# Patient Record
Sex: Female | Born: 1955
Health system: Southern US, Community
[De-identification: ages and names within clinical notes are randomized; demographics above are authoritative.]

## PROBLEM LIST (undated history)

## (undated) DIAGNOSIS — C541 Malignant neoplasm of endometrium: Secondary | ICD-10-CM

## (undated) DIAGNOSIS — Z923 Personal history of irradiation: Secondary | ICD-10-CM

## (undated) DIAGNOSIS — F329 Major depressive disorder, single episode, unspecified: Secondary | ICD-10-CM

## (undated) DIAGNOSIS — Z9889 Other specified postprocedural states: Secondary | ICD-10-CM

## (undated) DIAGNOSIS — R112 Nausea with vomiting, unspecified: Secondary | ICD-10-CM

## (undated) DIAGNOSIS — F419 Anxiety disorder, unspecified: Secondary | ICD-10-CM

## (undated) DIAGNOSIS — IMO0001 Reserved for inherently not codable concepts without codable children: Secondary | ICD-10-CM

## (undated) DIAGNOSIS — F32A Depression, unspecified: Secondary | ICD-10-CM

## (undated) DIAGNOSIS — I10 Essential (primary) hypertension: Secondary | ICD-10-CM

## (undated) DIAGNOSIS — IMO0002 Reserved for concepts with insufficient information to code with codable children: Secondary | ICD-10-CM

## (undated) DIAGNOSIS — R918 Other nonspecific abnormal finding of lung field: Secondary | ICD-10-CM

## (undated) DIAGNOSIS — E041 Nontoxic single thyroid nodule: Secondary | ICD-10-CM

## (undated) DIAGNOSIS — D649 Anemia, unspecified: Secondary | ICD-10-CM

## (undated) DIAGNOSIS — E785 Hyperlipidemia, unspecified: Secondary | ICD-10-CM

## (undated) HISTORY — DX: Hyperlipidemia, unspecified: E78.5

## (undated) HISTORY — DX: Malignant neoplasm of endometrium: C54.1

## (undated) HISTORY — PX: ABDOMINAL HYSTERECTOMY: SHX81

## (undated) HISTORY — DX: Reserved for inherently not codable concepts without codable children: IMO0001

## (undated) HISTORY — DX: Reserved for concepts with insufficient information to code with codable children: IMO0002

## (undated) HISTORY — DX: Personal history of irradiation: Z92.3

## (undated) HISTORY — DX: Essential (primary) hypertension: I10

---

## 1998-08-12 ENCOUNTER — Other Ambulatory Visit: Admission: RE | Admit: 1998-08-12 | Discharge: 1998-08-12 | Payer: Self-pay | Admitting: Obstetrics and Gynecology

## 2005-09-28 ENCOUNTER — Ambulatory Visit (HOSPITAL_COMMUNITY): Admission: RE | Admit: 2005-09-28 | Discharge: 2005-09-28 | Payer: Self-pay | Admitting: Internal Medicine

## 2006-06-05 HISTORY — PX: COLONOSCOPY: SHX174

## 2006-09-20 ENCOUNTER — Other Ambulatory Visit: Admission: RE | Admit: 2006-09-20 | Discharge: 2006-09-20 | Payer: Self-pay | Admitting: Family Medicine

## 2006-11-23 ENCOUNTER — Ambulatory Visit (HOSPITAL_COMMUNITY): Admission: RE | Admit: 2006-11-23 | Discharge: 2006-11-23 | Payer: Self-pay | Admitting: Gastroenterology

## 2007-03-20 ENCOUNTER — Ambulatory Visit (HOSPITAL_COMMUNITY): Admission: RE | Admit: 2007-03-20 | Discharge: 2007-03-20 | Payer: Self-pay | Admitting: Family Medicine

## 2008-06-16 ENCOUNTER — Ambulatory Visit: Payer: Self-pay | Admitting: Family Medicine

## 2009-03-19 ENCOUNTER — Ambulatory Visit (HOSPITAL_COMMUNITY): Admission: RE | Admit: 2009-03-19 | Discharge: 2009-03-19 | Payer: Self-pay | Admitting: Family Medicine

## 2010-01-04 ENCOUNTER — Other Ambulatory Visit: Admission: RE | Admit: 2010-01-04 | Discharge: 2010-01-04 | Payer: Self-pay | Admitting: Family Medicine

## 2010-03-20 ENCOUNTER — Emergency Department (HOSPITAL_COMMUNITY): Admission: EM | Admit: 2010-03-20 | Discharge: 2010-03-20 | Payer: Self-pay | Admitting: Emergency Medicine

## 2010-07-05 NOTE — Assessment & Plan Note (Signed)
Summary: nutrition class,df

## 2010-08-17 LAB — URINALYSIS, ROUTINE W REFLEX MICROSCOPIC
Bilirubin Urine: NEGATIVE
Glucose, UA: NEGATIVE mg/dL
Hgb urine dipstick: NEGATIVE
Ketones, ur: NEGATIVE mg/dL
Nitrite: NEGATIVE
Protein, ur: NEGATIVE mg/dL
Specific Gravity, Urine: 1.018 (ref 1.005–1.030)
Urobilinogen, UA: 0.2 mg/dL (ref 0.0–1.0)
pH: 6 (ref 5.0–8.0)

## 2010-08-17 LAB — POCT I-STAT, CHEM 8
BUN: 23 mg/dL (ref 6–23)
BUN: 26 mg/dL — ABNORMAL HIGH (ref 6–23)
Calcium, Ion: 1.11 mmol/L — ABNORMAL LOW (ref 1.12–1.32)
Calcium, Ion: 1.15 mmol/L (ref 1.12–1.32)
Chloride: 104 mEq/L (ref 96–112)
Chloride: 106 mEq/L (ref 96–112)
Creatinine, Ser: 1.4 mg/dL — ABNORMAL HIGH (ref 0.4–1.2)
Creatinine, Ser: 1.7 mg/dL — ABNORMAL HIGH (ref 0.4–1.2)
Glucose, Bld: 101 mg/dL — ABNORMAL HIGH (ref 70–99)
Glucose, Bld: 131 mg/dL — ABNORMAL HIGH (ref 70–99)
HCT: 32 % — ABNORMAL LOW (ref 36.0–46.0)
HCT: 35 % — ABNORMAL LOW (ref 36.0–46.0)
Hemoglobin: 10.9 g/dL — ABNORMAL LOW (ref 12.0–15.0)
Hemoglobin: 11.9 g/dL — ABNORMAL LOW (ref 12.0–15.0)
Potassium: 4 mEq/L (ref 3.5–5.1)
Potassium: 4.1 mEq/L (ref 3.5–5.1)
Sodium: 135 mEq/L (ref 135–145)
Sodium: 135 mEq/L (ref 135–145)
TCO2: 20 mmol/L (ref 0–100)
TCO2: 23 mmol/L (ref 0–100)

## 2010-08-17 LAB — URINE MICROSCOPIC-ADD ON

## 2010-08-17 LAB — POCT CARDIAC MARKERS
CKMB, poc: <1 ng/mL — ABNORMAL LOW (ref 1.0–8.0)
Myoglobin, poc: 113 ng/mL (ref 12–200)
Troponin i, poc: <0.05 ng/mL (ref 0.00–0.09)

## 2010-10-18 NOTE — Op Note (Signed)
Diane Lozano, Diane Lozano                 ACCOUNT NO.:  [id number]   MEDICAL RECORD NO.:  [id number]          PATIENT TYPE:  AMB   LOCATION:  ENDO                         FACILITY:  [facility]   PHYSICIAN:  Shirley Friar, MDDATE OF BIRTH:  1956-05-15   DATE OF PROCEDURE:  DATE OF DISCHARGE:                               OPERATIVE REPORT   PROCEDURE:  Colonoscopy.   INDICATIONS:  Screening.   MEDICATIONS:  Fentanyl 75 mcg IV, Versed 7 mg IV.   FINDINGS:  Rectal exam was normal.  An adult Pentax colonoscope was  inserted into an adequately prepped colon and advanced to the cecum  where the ileocecal valve and appendiceal orifice were identified.  The  terminal ileum was intubated and was normal in appearance.  On careful  withdrawal of the colonoscope, a small single diverticulum was noted in  the proximal ascending colon.  No other mucosal abnormalities were seen  in the colon.  Retroflexion was unremarkable.   ASSESSMENT:  Sigmoid diverticulum in proximal ascending colon.  Otherwise normal colonoscopy.   PLAN:  Repeat colonoscopy in 10 years.      Shirley Friar, MD  Electronically Signed     VCS/MEDQ  D:  11/23/2006  T:  11/23/2006  Job:  [id number]   cc:   Sigmund Hazel, M.D.  Fax: [phone]

## 2012-06-05 DIAGNOSIS — C541 Malignant neoplasm of endometrium: Secondary | ICD-10-CM

## 2012-06-05 HISTORY — DX: Malignant neoplasm of endometrium: C54.1

## 2012-06-20 ENCOUNTER — Other Ambulatory Visit (HOSPITAL_COMMUNITY)
Admission: RE | Admit: 2012-06-20 | Discharge: 2012-06-20 | Disposition: A | Discharge status: Home / Self Care | Payer: 59 | Source: Ambulatory Visit | Attending: Family Medicine | Admitting: Family Medicine

## 2012-06-20 ENCOUNTER — Other Ambulatory Visit: Payer: Self-pay | Admitting: Family Medicine

## 2012-06-20 DIAGNOSIS — Z01419 Encounter for gynecological examination (general) (routine) without abnormal findings: Secondary | ICD-10-CM | POA: Insufficient documentation

## 2012-06-20 DIAGNOSIS — Z1151 Encounter for screening for human papillomavirus (HPV): Secondary | ICD-10-CM | POA: Insufficient documentation

## 2012-06-20 DIAGNOSIS — N950 Postmenopausal bleeding: Secondary | ICD-10-CM

## 2012-06-26 ENCOUNTER — Ambulatory Visit
Admission: RE | Admit: 2012-06-26 | Discharge: 2012-06-26 | Disposition: A | Discharge status: Home / Self Care | Payer: 59 | Source: Ambulatory Visit | Attending: Family Medicine | Admitting: Family Medicine

## 2012-06-26 DIAGNOSIS — N950 Postmenopausal bleeding: Secondary | ICD-10-CM

## 2012-08-16 ENCOUNTER — Telehealth: Payer: Self-pay | Admitting: Oncology

## 2012-08-16 NOTE — Telephone Encounter (Signed)
REFERRING - [facility] DX- ENDOMETRIUM  WELCOME PACKET MAILED.

## 2012-08-16 NOTE — Telephone Encounter (Signed)
LVOM FOR PT TO RETURN CALL IN RE NP APPT.  °

## 2012-08-19 ENCOUNTER — Telehealth: Payer: Self-pay | Admitting: Oncology

## 2012-08-19 NOTE — Telephone Encounter (Signed)
C/D 08/19/12 for appt.09/06/12

## 2012-08-20 ENCOUNTER — Other Ambulatory Visit: Payer: Self-pay | Admitting: Oncology

## 2012-08-20 ENCOUNTER — Telehealth: Payer: Self-pay

## 2012-08-20 NOTE — Telephone Encounter (Signed)
S/W pt in re to NP appt change to 03/24 @ 9:30 w/Dr. Darrold Span and Chemo Edu/PFC schedule for 03/19 @ 10.  Pt confirmed appts.

## 2012-08-20 NOTE — Telephone Encounter (Signed)
Left a message for [facility] PA for Dr. Clifton James that Ms. Magouirk's appointment was rescheduled to 08-26-12 with Chemotherapy Education scheduled for 08-21-12 by our new patient coordinator York Cerise.  Pt. Was notified of this rescheduling.

## 2012-08-20 NOTE — Telephone Encounter (Signed)
Revonda Standard with Dr. Clifton James called to see if Ms. Diane Lozano could be seen sooner than 09-06-12.  She is currently ~3 weeks post op and Dr. Clifton James would like for chemotherapy to begin in the next ~2 weeks.

## 2012-08-21 ENCOUNTER — Other Ambulatory Visit: Payer: 59

## 2012-08-21 ENCOUNTER — Encounter: Payer: Self-pay | Admitting: *Deleted

## 2012-08-21 ENCOUNTER — Ambulatory Visit: Payer: 59

## 2012-08-25 ENCOUNTER — Other Ambulatory Visit: Payer: Self-pay | Admitting: Oncology

## 2012-08-25 DIAGNOSIS — C541 Malignant neoplasm of endometrium: Secondary | ICD-10-CM

## 2012-08-26 ENCOUNTER — Ambulatory Visit (HOSPITAL_BASED_OUTPATIENT_CLINIC_OR_DEPARTMENT_OTHER): Payer: 59 | Admitting: Oncology

## 2012-08-26 ENCOUNTER — Other Ambulatory Visit (HOSPITAL_BASED_OUTPATIENT_CLINIC_OR_DEPARTMENT_OTHER): Payer: 59 | Admitting: Lab

## 2012-08-26 ENCOUNTER — Encounter: Payer: Self-pay | Admitting: Oncology

## 2012-08-26 ENCOUNTER — Ambulatory Visit: Payer: 59

## 2012-08-26 ENCOUNTER — Telehealth: Payer: Self-pay | Admitting: Oncology

## 2012-08-26 VITALS — BP 126/72 | HR 92 | Temp 97.5°F | Resp 18 | Ht 61.0 in | Wt 204.7 lb

## 2012-08-26 DIAGNOSIS — Z808 Family history of malignant neoplasm of other organs or systems: Secondary | ICD-10-CM

## 2012-08-26 DIAGNOSIS — E789 Disorder of lipoprotein metabolism, unspecified: Secondary | ICD-10-CM

## 2012-08-26 DIAGNOSIS — C549 Malignant neoplasm of corpus uteri, unspecified: Secondary | ICD-10-CM

## 2012-08-26 DIAGNOSIS — C541 Malignant neoplasm of endometrium: Secondary | ICD-10-CM

## 2012-08-26 DIAGNOSIS — I10 Essential (primary) hypertension: Secondary | ICD-10-CM | POA: Insufficient documentation

## 2012-08-26 LAB — CBC WITH DIFFERENTIAL/PLATELET
BASO%: 2 % (ref 0.0–2.0)
Basophils Absolute: 0.1 10*3/uL (ref 0.0–0.1)
EOS%: 6.4 % (ref 0.0–7.0)
Eosinophils Absolute: 0.4 10*3/uL (ref 0.0–0.5)
HCT: 37.8 % (ref 34.8–46.6)
HGB: 13 g/dL (ref 11.6–15.9)
LYMPH%: 23 % (ref 14.0–49.7)
MCH: 30.8 pg (ref 25.1–34.0)
MCHC: 34.5 g/dL (ref 31.5–36.0)
MCV: 89.4 fL (ref 79.5–101.0)
MONO#: 0.4 10*3/uL (ref 0.1–0.9)
MONO%: 6.6 % (ref 0.0–14.0)
NEUT#: 4 10*3/uL (ref 1.5–6.5)
NEUT%: 62 % (ref 38.4–76.8)
Platelets: 266 10*3/uL (ref 145–400)
RBC: 4.22 10*6/uL (ref 3.70–5.45)
RDW: 12.9 % (ref 11.2–14.5)
WBC: 6.5 10*3/uL (ref 3.9–10.3)
lymph#: 1.5 10*3/uL (ref 0.9–3.3)

## 2012-08-26 LAB — COMPREHENSIVE METABOLIC PANEL (CC13)
ALT: 26 U/L (ref 0–55)
AST: 19 U/L (ref 5–34)
Albumin: 3.9 g/dL (ref 3.5–5.0)
Alkaline Phosphatase: 78 U/L (ref 40–150)
BUN: 20.6 mg/dL (ref 7.0–26.0)
CO2: 24 mEq/L (ref 22–29)
Calcium: 9.8 mg/dL (ref 8.4–10.4)
Chloride: 103 mEq/L (ref 98–107)
Creatinine: 0.8 mg/dL (ref 0.6–1.1)
Glucose: 97 mg/dl (ref 70–99)
Potassium: 3.8 mEq/L (ref 3.5–5.1)
Sodium: 139 mEq/L (ref 136–145)
Total Bilirubin: 0.45 mg/dL (ref 0.20–1.20)
Total Protein: 7.8 g/dL (ref 6.4–8.3)

## 2012-08-26 LAB — IRON AND TIBC
%SAT: 22 % (ref 20–55)
Iron: 73 ug/dL (ref 42–145)
TIBC: 326 ug/dL (ref 250–470)
UIBC: 253 ug/dL (ref 125–400)

## 2012-08-26 NOTE — Progress Notes (Signed)
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 postmenopausal female who is seen, together with husband, at the request of Dr Madaline Guthrie, for consideration of adjuvant chemotherapy for recently diagnosed endometrial carcinoma. 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  Patient presented to PCP Sigmund Hazel with intermittent vaginal bleeding since ~ Oct 2013. Dr Hyacinth Meeker did endometrial biopsy 06-20-12 which showed complex endometrial hyperplasia with atypia. She had pelvic and transvaginal US in [facility] system on 06-26-12, which showed endometrial thickening of 2.7 cm, right ovary not clearly seen and left ovary with normal postmenopausal appearance. She was seen by Dr Gerald Leitz and referred to Dr Clifton James. She had robotic hysterectomy with BSO and bilateral pelvic and periaortic node dissection by Dr Clifton James at [facility] on 07-30-2012. Intraoperatively she had large endometrial polyp with high grade malignancy, no obvious extrauterine disease. Pathology ([facility] [place] [id number] from 07-30-2012) had high grade, poorly differentiated endometrial adenocarcinoma with solid and serous papillary components, 2 periaortic nodes negative and 9 right pelvic/11 left pelvic nodes negative. The tumor was 4.8 cm confined to polyp with no definite myometrial involvement, margins clear and no angiolymphatic invasion identified. Pathology report comment "Two components were identified. A conspicuous serous papillary malignancy was confirmed with p53 stain. In adition, there was a second tumor  component composed of sheets of extremely anaplastic cells with eosinophilic cytoplasm and frequent giant cells. These cells were strongly positive for cytokeratin AE1/AE# and vimentin and were negative for desmin, myogenin and smooth muscle actin. This would exclude a malignant mixed mullarian tumor and is more indicative of a very high grade solid endometrial adenocarcinoma. 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 She saw Dr Clifton James in follow up ~ 08-15-12 and I spoke with Dr Clifton James directly on 08-20-12. With concerning pathology, Dr Clifton James recommends q 3 week taxol carboplatin x 6 cycles, taxol at 175 mg/m2 and carboplatin at AUC = 5, and would consider vaginal brachytherapy concomitant with chemotherapy.  Patient attended chemotherapy education class prior to visit today. She is also familiar with [facility] as she previously worked [employer] and was involved with lung cancer multidisciplinary clinic there. As referral has not yet been made to Dr Roselind Messier, I have requested that consultation.  REVIEW OF SYSTEMS: Weight steady. Some constipation after surgery, bowels now moving regularly. Does not need pain meds now. No other bleeding. No HA, wears corrective lenses, due dental cleaning but no known active problems, no thyroid symptoms, no respiratory or cardiac symptoms, no bladder problems, no arthritis, no hx blood clots, no LE swelling. Feels she has recovered well from the robotic surgery. Peripheral IV access has been easily accomplished.    ALLERGIES: Review of patient's allergies indicates no known allergies.  PAST MEDICAL HISTORY:   HTN x ~ 10 years Elevated lipids C sections x2 Last mammograms at [facility] ~ 2 yrs ago Colonoscopy by Deboraha Sprang 2008, next in 10 years Menopause late 52s   Gyn oncology surgery as above  CURRENT MEDICATIONS: reviewed as listed in EMR. We will send prescriptions to [facility]  outpatient  pharmacy for decadron, lorazepam and zofran. She also uses mycostatin powder under breasts prn.   SOCIAL HISTORY:  reports that she quit smoking about 30 years ago. She does not have any smokeless tobacco history on file. ~ 10 pack year smoking history. Occasional ETOH, no drugs, no transfusions. She is originally from [place], has been in [employer] x 20 years. One son, one daughter, one here and one in [place], ages ~ 49 and 19. No grandchildren. Husband is retired from Geographical information systems officer "worldwide".   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 She plans to return to work this afternoon; I am glad to complete FMLA papers for intermittent time out of work with chemotherapy.   FAMILY HISTORY: Father died of cardiac disease, had renal disease Mother living 8 healthy other than dementia and thyroid disease Brother had melanoma involving eye Daughter with polycystic ovarian disease No other cancer in family  LABORATORY DATA:  Results for orders placed in visit on 08/26/12 (from the past 48 hour(s))  CBC WITH DIFFERENTIAL     Status: None   Collection Time    08/26/12  9:41 AM      Result Value Range   WBC 6.5  3.9 - 10.3 10e3/uL   NEUT# 4.0  1.5 - 6.5 10e3/uL   HGB 13.0  11.6 - 15.9 g/dL   HCT 21.3  08.6 - 57.8 %   Platelets 266  145 - 400 10e3/uL   MCV 89.4  79.5 - 101.0 fL   MCH 30.8  25.1 - 34.0 pg   MCHC 34.5  31.5 - 36.0 g/dL   RBC 4.69  6.29 - 5.28 10e6/uL   RDW 12.9  11.2 - 14.5 %   lymph# 1.5  0.9 - 3.3 10e3/uL   MONO# 0.4  0.1 - 0.9 10e3/uL   Eosinophils Absolute 0.4  0.0 - 0.5 10e3/uL   Basophils Absolute 0.1  0.0 - 0.1 10e3/uL   NEUT% 62.0  38.4 - 76.8 %   LYMPH% 23.0  14.0 - 49.7 %   MONO% 6.6  0.0 - 14.0 %   EOS% 6.4  0.0 - 7.0 %   BASO% 2.0  0.0 - 2.0 %  IRON AND TIBC     Status: None   Collection Time    08/26/12  9:41 AM      Result Value  Range   Iron 73  42 - 145 ug/dL   UIBC 413  244 - 010 ug/dL   TIBC 272  536 - 644 ug/dL   %SAT 22  20 - 55 %  COMPREHENSIVE METABOLIC PANEL (CC13)     Status: None   Collection Time    08/26/12  9:41 AM      Result Value Range   Sodium 139  136 - 145 mEq/L   Potassium 3.8  3.5 - 5.1 mEq/L   Chloride 103  98 - 107 mEq/L   CO2 24  22 - 29 mEq/L   Glucose 97  70 - 99 mg/dl   BUN 03.4  7.0 - 74.2 mg/dL   Creatinine 0.8  0.6 - 1.1 mg/dL   Total Bilirubin 5.95  0.20 - 1.20 mg/dL   Alkaline Phosphatase 78  40 - 150 U/L   AST 19  5 - 34 U/L   ALT 26  0 - 55 U/L   Total Protein  7.8  6.4 - 8.3 g/dL   Albumin 3.9  3.5 - 5.0 g/dL   Calcium 9.8  8.4 - 16.1 mg/dL       RADIOGRAPHY:      TRANSABDOMINAL AND TRANSVAGINAL ULTRASOUND OF PELVIS  06-26-12 Technique: Both transabdominal and transvaginal ultrasound  examinations of the pelvis were performed. Transabdominal technique  was performed for global imaging of the pelvis including uterus,  ovaries, adnexal regions, and pelvic cul-de-sac.  It was necessary to proceed with endovaginal exam following the  transabdominal exam to visualize the myometrium, endometrium and  adnexa.  Comparison: None  Findings:  Uterus: Is anteverted and anteflexed and demonstrates a sagittal  length of 7.6 cm, depth of 4.5 cm and width of 5.3 cm. No focal  mural abnormalities are identified  Endometrium: Appears markedly thickened with a width of 2.7 cm.  Some mild internal heterogeneity is seen and intravascular flow is  noted with color Doppler assessment. This appearance and degree of  thickening is abnormal in a postmenopausal patient experiencing  vaginal bleeding and differential considerations would include  endometrial carcinoma, polyp formation or diffuse hyperplasia.  Right ovary: Is not seen with confidence either transabdominally  or endovaginally  Left ovary: Has a normal postmenopausal appearance measuring 2.3 x  1.3 x 2.2 cm  Other  findings: No pelvic fluid or separate adnexal masses are  seen.  IMPRESSION:  Thickened endometrial lining in a postmenopausal patient  experiencing vaginal bleeding. In the setting of post-menopausal  bleeding, endometrial sampling is indicated to exclude carcinoma.  If results are benign, sonohysterogram should be considered for  focal lesion work-up. (Ref: Radiological Reasoning: Algorithmic  Workup of Abnormal Vaginal Bleeding with Endovaginal Sonography and  Sonohysterography. AJR 2008; 096:E45-40).  No focal myometrial abnormalities are seen. Normal left ovary and  non-visualized right ovary   CXR in [facility] system 07-30-12 reportedly with slight hyperinflation, mild fibrosis or atelectasis at bases  She has not had CT AP, which she understands Dr Gibson Ramp office was to set up in [facility] system but which is not yet scheduled. That order placed by this MD now.  PHYSICAL EXAM:  height is 5\' 1"  (1.549 m) and weight is 204 lb 11.2 oz (92.851 kg). Her oral temperature is 97.5 F (36.4 C). Her blood pressure is 126/72 and her pulse is 92. Her respiration is 18.  Easily mobile, very pleasant, excellent historian, husband very supportive.  HEENT: normal hair pattern. PERRL, not icteric. Oral mucosa moist and clear, no obvious dental problems. Neck supple without JVD or thyroid mass. Lymphatics: no cervical, supraclavicular, axillary or inguinal adenopathy. Breasts without dominant mass, skin or nipple findings of concern. Skin beneath breasts with moist irritation consistent with superficial yeast infection. Heart RRR no gallop or murmur Lungs clear to A & P to bases Back nontender Abdomen obese, soft, not distended, nontender, normal bowel sounds. Scars from laparoscopic robotic surgery healing, steristrips still in place RLQ and umbilicus. No appreciable HSM. LE no edema, cords, tenderness. Skin without rash or ecchymosis. Veins in UE appear very adequate for this chemotherapy. Neuro  nonfocal CN, motor, sensory, cerebellar    We have discussed circumstances surrounding her diagnosis, surgical treatment and pathology information, recommendation for adjuvant chemotherapy, history of taxol and carboplatin in gyn cancers, premedication steroids, antiemetics and follow up at this office. They have had all questions answered to their satisfaction and are in agreement with proceeding as quickly as possible, prefer treatment on Fridays due to her Mon thru Fri work schedule.  We will be able to start cycle 1 on Friday 08-30-12.  They understand that they can be in touch with this office at any time if further questions or concerns. I will see her again on 4-4 or 4-7, then on 4-23 prior to cycle 2. Dr Clifton James plans to follow her up also during the chemotherapy.   IMPRESSION / PLAN:  1.IA high grade endometrial carcinoma involving endometrial polyp, post optimal surgery by Dr Clifton James 07-30-12 and now for adjuvant taxol carboplatin q 3 weeks x 6 cycles beginning 08-30-12. CT AP for baseline in near future. Referral to Dr Roselind Messier for consideration of radiation also. If HDR, we will plan to give chemotherapy concomitantly. 2.HTN, elevated lipids 3.obesity 4.overdue mammograms which she would like to have after chemo completes 5.melanoma in brother   Reece Packer, MD 08/26/2012 8:30 PM

## 2012-08-26 NOTE — Telephone Encounter (Signed)
Gave pt appt for lab,MD chemo March and April 2014 and Dr. Darleene Cleaver appt, Radiation oncology on 4/7 date per pt rqst

## 2012-08-26 NOTE — Patient Instructions (Addendum)
We will send scripts for decadron, zofran, ativan to [facility] outpatient pharmacy.  [facility] fine if you prefer.  The decadron (dexamethasone, steroid) is to be taken 12 hours and 6 hours prior to chemo, to lessen chance of allergic reactions to the taxol.  You will take five of the 4 mg tablets (=20 mg) 12 hours and again 6 hours before chemo, each dose with food. First chemo will be at 9:30 on Friday 3-28, so you should take the steroid ~ 9:30 PM on Thurs 3-27 with food and again ~ 3:30 AM on Fri 3-28 with food.  Night of chemo, whether or not any nausea, take ativan at bedtime (lorazepam). This will make you drowsy  AM after chemo, whether or not any nausea, take zofran (ondansetron) 8 mg.   Phone # any time  [phone]

## 2012-08-28 ENCOUNTER — Telehealth: Payer: Self-pay

## 2012-08-28 ENCOUNTER — Ambulatory Visit (HOSPITAL_COMMUNITY)
Admission: RE | Admit: 2012-08-28 | Discharge: 2012-08-28 | Disposition: A | Discharge status: Home / Self Care | Payer: 59 | Source: Ambulatory Visit | Attending: Oncology | Admitting: Oncology

## 2012-08-28 DIAGNOSIS — C541 Malignant neoplasm of endometrium: Secondary | ICD-10-CM

## 2012-08-28 DIAGNOSIS — C549 Malignant neoplasm of corpus uteri, unspecified: Secondary | ICD-10-CM | POA: Insufficient documentation

## 2012-08-28 DIAGNOSIS — K439 Ventral hernia without obstruction or gangrene: Secondary | ICD-10-CM | POA: Insufficient documentation

## 2012-08-28 MED ORDER — DEXAMETHASONE 4 MG PO TABS: ORAL_TABLET | ORAL | Status: DC

## 2012-08-28 MED ORDER — IOHEXOL 300 MG/ML  SOLN
100.0000 mL | Freq: Once | INTRAMUSCULAR | Status: AC | PRN
  Administered 2012-08-28: 100 mL via INTRAVENOUS

## 2012-08-28 MED ORDER — ONDANSETRON HCL 8 MG PO TABS: 8.0000 mg | ORAL_TABLET | Freq: Two times a day (BID) | ORAL | Status: DC | PRN

## 2012-08-28 MED ORDER — LORAZEPAM 1 MG PO TABS: ORAL_TABLET | ORAL | Status: DC

## 2012-08-28 NOTE — Telephone Encounter (Signed)
Told Diane Lozano that the zofran, decadron, and ativan were called in to [facility] outpatient pharmacy.

## 2012-08-29 ENCOUNTER — Telehealth: Payer: Self-pay | Admitting: *Deleted

## 2012-08-29 NOTE — Telephone Encounter (Signed)
Tried to reach pt at home, work and cell without success. Husband, Nadine Counts, given results below. (Has information permission document in chart.)

## 2012-08-29 NOTE — Telephone Encounter (Signed)
Message copied by Carola Rhine A on Thu Aug 29, 2012 10:13 AM ------      Message from: Jama Flavors P      Created: Wed Aug 28, 2012  8:53 PM       Labs seen and need follow up: please let her know the CT does not show anything that appears of concern from standpoint of cancer.      Cc LA, TH ------

## 2012-08-30 ENCOUNTER — Ambulatory Visit (HOSPITAL_BASED_OUTPATIENT_CLINIC_OR_DEPARTMENT_OTHER): Payer: 59

## 2012-08-30 VITALS — BP 129/61 | HR 100 | Temp 97.8°F | Resp 18

## 2012-08-30 DIAGNOSIS — C541 Malignant neoplasm of endometrium: Secondary | ICD-10-CM

## 2012-08-30 DIAGNOSIS — Z5111 Encounter for antineoplastic chemotherapy: Secondary | ICD-10-CM

## 2012-08-30 DIAGNOSIS — C549 Malignant neoplasm of corpus uteri, unspecified: Secondary | ICD-10-CM

## 2012-08-30 MED ORDER — SODIUM CHLORIDE 0.9 % IV SOLN
680.0000 mg | Freq: Once | INTRAVENOUS | Status: AC
  Administered 2012-08-30: 680 mg via INTRAVENOUS
  Filled 2012-08-30: qty 68

## 2012-08-30 MED ORDER — DEXAMETHASONE SODIUM PHOSPHATE 4 MG/ML IJ SOLN
20.0000 mg | Freq: Once | INTRAMUSCULAR | Status: AC
  Administered 2012-08-30: 20 mg via INTRAVENOUS

## 2012-08-30 MED ORDER — FAMOTIDINE IN NACL 20-0.9 MG/50ML-% IV SOLN
20.0000 mg | Freq: Once | INTRAVENOUS | Status: AC
  Administered 2012-08-30: 20 mg via INTRAVENOUS

## 2012-08-30 MED ORDER — PACLITAXEL CHEMO INJECTION 300 MG/50ML
175.0000 mg/m2 | Freq: Once | INTRAVENOUS | Status: AC
  Administered 2012-08-30: 348 mg via INTRAVENOUS
  Filled 2012-08-30: qty 58

## 2012-08-30 MED ORDER — DIPHENHYDRAMINE HCL 25 MG PO TABS
25.0000 mg | ORAL_TABLET | Freq: Once | ORAL | Status: AC
  Administered 2012-08-30: 25 mg via ORAL
  Filled 2012-08-30: qty 1

## 2012-08-30 MED ORDER — DIPHENHYDRAMINE HCL 50 MG/ML IJ SOLN
50.0000 mg | Freq: Once | INTRAMUSCULAR | Status: AC
  Administered 2012-08-30: 50 mg via INTRAVENOUS

## 2012-08-30 MED ORDER — SODIUM CHLORIDE 0.9 % IV SOLN
Freq: Once | INTRAVENOUS | Status: AC
  Administered 2012-08-30: 10:00:00 via INTRAVENOUS

## 2012-08-30 MED ORDER — ONDANSETRON 16 MG/50ML IVPB (CHCC)
16.0000 mg | Freq: Once | INTRAVENOUS | Status: AC
  Administered 2012-08-30: 16 mg via INTRAVENOUS

## 2012-08-30 NOTE — Patient Instructions (Addendum)
[facility] Discharge Instructions for Patients Receiving Chemotherapy  Today you received the following chemotherapy agents Taxol and Carboplatin.  To help prevent nausea and vomiting after your treatment, we encourage you to take your nausea medication.   If you develop nausea and vomiting that is not controlled by your nausea medication, call the clinic. If it is after clinic hours your family physician or the after hours number for the clinic or go to the Emergency Department.   BELOW ARE SYMPTOMS THAT SHOULD BE REPORTED IMMEDIATELY:  *FEVER GREATER THAN 100.5 F  *CHILLS WITH OR WITHOUT FEVER  NAUSEA AND VOMITING THAT IS NOT CONTROLLED WITH YOUR NAUSEA MEDICATION  *UNUSUAL SHORTNESS OF BREATH  *UNUSUAL BRUISING OR BLEEDING  TENDERNESS IN MOUTH AND THROAT WITH OR WITHOUT PRESENCE OF ULCERS  *URINARY PROBLEMS  *BOWEL PROBLEMS  UNUSUAL RASH Items with * indicate a potential emergency and should be followed up as soon as possible.  One of the nurses will contact you 24 hours after your treatment. Please let the nurse know about any problems that you may have experienced. Feel free to call the clinic you have any questions or concerns. The clinic phone number is [phone].   I have been informed and understand all the instructions given to me. I know to contact the clinic, my physician, or go to the Emergency Department if any problems should occur. I do not have any questions at this time, but understand that I may call the clinic during office hours   should I have any questions or need assistance in obtaining follow up care.    __________________________________________  _____________  __________ Signature of Patient or Authorized Representative            Date                   Time    __________________________________________ Nurse's Signature    

## 2012-08-30 NOTE — Progress Notes (Signed)
1135- Patient complains of dizziness, dull headache 2/10 on pain scale, "lump in her chest" and noted to have "blood shot eyes". Taxol stopped and NS started at 163mls/hour. Dr Darrold Span notified. Orders received to give Benadryl 25mg  PO now and restart Taxol and same rate.   1200- Patient tolerating treatment well. Eyes seem within normal color now and redness appears to be resolved. No complaints voiced.

## 2012-09-02 ENCOUNTER — Telehealth: Payer: Self-pay | Admitting: *Deleted

## 2012-09-02 ENCOUNTER — Telehealth: Payer: Self-pay

## 2012-09-02 ENCOUNTER — Encounter: Payer: Self-pay | Admitting: Oncology

## 2012-09-02 NOTE — Progress Notes (Signed)
Put fmla form on nurse's desk °

## 2012-09-02 NOTE — Telephone Encounter (Signed)
PT.CALLED AND SPOKE TO DR.LIVESAY'S NURSE, LOUISE ARCHAMBAULT,RN, CONCERNING HER ISSUES WITH THE CHEMOTHERAPY SHE RECEIVED ON Friday.

## 2012-09-02 NOTE — Telephone Encounter (Signed)
Ms. Devita felt well until yesterday.  She began with severe aches in legs and arms, slight nausea which the nausea meds helped with.  Intermittent heart burn-using Pepcid 20 mg effective. Suggested that she take 1 tab daily not intermittently.  She had to come home from work today as she had trouble functioning.  She felt asthough a Mack truck hit her.  Told her that this is to be expected ~3 day after the chemotherapy. The aches are pretty severe.  It is difficult to walk or get comfortable.  Using Advil with minimal effect.  Suggested heating pad and hot shower.  This is from the Taxol and will subside in a few days. She can try her pain medication that she has left over from her surgery.  Told her that it may help to take the edge off.  She needs to be very carefull about constipation with the painmeds.  She needs to stay on top of this and not go longer than 3 days without a BM. Pt. Felt better to know that all of this is expected and will pass.

## 2012-09-03 ENCOUNTER — Encounter: Payer: Self-pay | Admitting: Oncology

## 2012-09-03 NOTE — Progress Notes (Signed)
Faxed fmla form to Brenda Jones @ [phone] °

## 2012-09-06 ENCOUNTER — Other Ambulatory Visit: Payer: 59 | Admitting: Lab

## 2012-09-06 ENCOUNTER — Encounter: Payer: Self-pay | Admitting: Radiation Oncology

## 2012-09-06 ENCOUNTER — Ambulatory Visit: Payer: 59 | Admitting: Oncology

## 2012-09-06 ENCOUNTER — Ambulatory Visit: Payer: 59

## 2012-09-06 NOTE — Progress Notes (Signed)
57 year old female. 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 Originally from [place]. Married. Has one son and one daughter.  S/P robotic hysterectomy with BSO and bilateral pelvic and periaortic node dissection by Dr. Clifton James on 07/30/12. High grade, poorly differentiated endometrial adenocarcinoma with solid and serous papillary components, 2 periaortic nodes negative and 9 right pelvic of 11 nodes negative. 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  Dr. Clifton James recommends every three weeks taxol carboplatin x 6 cycles, vaginal brachytherapy concomitant with chemotherapy.  NKDA No hx of radiation therapy No indication of a pacemaker

## 2012-09-09 ENCOUNTER — Encounter: Payer: Self-pay | Admitting: Radiation Oncology

## 2012-09-09 ENCOUNTER — Ambulatory Visit
Admission: RE | Admit: 2012-09-09 | Discharge: 2012-09-09 | Disposition: A | Discharge status: Home / Self Care | Payer: 59 | Source: Ambulatory Visit | Attending: Radiation Oncology | Admitting: Radiation Oncology

## 2012-09-09 ENCOUNTER — Encounter: Payer: Self-pay | Admitting: Oncology

## 2012-09-09 ENCOUNTER — Telehealth: Payer: Self-pay | Admitting: Oncology

## 2012-09-09 ENCOUNTER — Ambulatory Visit (HOSPITAL_BASED_OUTPATIENT_CLINIC_OR_DEPARTMENT_OTHER): Payer: 59 | Admitting: Oncology

## 2012-09-09 ENCOUNTER — Other Ambulatory Visit (HOSPITAL_BASED_OUTPATIENT_CLINIC_OR_DEPARTMENT_OTHER): Payer: 59 | Admitting: Lab

## 2012-09-09 ENCOUNTER — Telehealth: Payer: Self-pay | Admitting: *Deleted

## 2012-09-09 VITALS — BP 100/60 | HR 84 | Temp 97.4°F | Resp 16 | Ht 63.0 in | Wt 195.6 lb

## 2012-09-09 VITALS — BP 135/62 | HR 96 | Temp 98.0°F | Resp 18 | Ht 61.0 in | Wt 194.6 lb

## 2012-09-09 DIAGNOSIS — I10 Essential (primary) hypertension: Secondary | ICD-10-CM

## 2012-09-09 DIAGNOSIS — C549 Malignant neoplasm of corpus uteri, unspecified: Secondary | ICD-10-CM

## 2012-09-09 DIAGNOSIS — C541 Malignant neoplasm of endometrium: Secondary | ICD-10-CM

## 2012-09-09 DIAGNOSIS — E789 Disorder of lipoprotein metabolism, unspecified: Secondary | ICD-10-CM

## 2012-09-09 DIAGNOSIS — N840 Polyp of corpus uteri: Secondary | ICD-10-CM | POA: Insufficient documentation

## 2012-09-09 DIAGNOSIS — D709 Neutropenia, unspecified: Secondary | ICD-10-CM

## 2012-09-09 LAB — CBC WITH DIFFERENTIAL/PLATELET
BASO%: 3.6 % — ABNORMAL HIGH (ref 0.0–2.0)
Basophils Absolute: 0.1 10*3/uL (ref 0.0–0.1)
EOS%: 5.7 % (ref 0.0–7.0)
Eosinophils Absolute: 0.1 10*3/uL (ref 0.0–0.5)
HCT: 33.7 % — ABNORMAL LOW (ref 34.8–46.6)
HGB: 11.5 g/dL — ABNORMAL LOW (ref 11.6–15.9)
LYMPH%: 47.4 % (ref 14.0–49.7)
MCH: 30.1 pg (ref 25.1–34.0)
MCHC: 34.1 g/dL (ref 31.5–36.0)
MCV: 88.2 fL (ref 79.5–101.0)
MONO#: 0.4 10*3/uL (ref 0.1–0.9)
MONO%: 18.8 % — ABNORMAL HIGH (ref 0.0–14.0)
NEUT#: 0.5 10*3/uL — CL (ref 1.5–6.5)
NEUT%: 24.5 % — ABNORMAL LOW (ref 38.4–76.8)
Platelets: 211 10*3/uL (ref 145–400)
RBC: 3.82 10*6/uL (ref 3.70–5.45)
RDW: 12.3 % (ref 11.2–14.5)
WBC: 1.9 10*3/uL — ABNORMAL LOW (ref 3.9–10.3)
lymph#: 0.9 10*3/uL (ref 0.9–3.3)

## 2012-09-09 MED ORDER — CIPROFLOXACIN HCL 250 MG PO TABS: 250.0000 mg | ORAL_TABLET | Freq: Two times a day (BID) | ORAL | Status: DC

## 2012-09-09 MED ORDER — FILGRASTIM 480 MCG/0.8ML IJ SOLN
480.0000 ug | Freq: Once | INTRAMUSCULAR | Status: AC
  Administered 2012-09-09: 480 ug via SUBCUTANEOUS
  Filled 2012-09-09: qty 0.8

## 2012-09-09 MED ORDER — PANTOPRAZOLE SODIUM 40 MG PO TBEC: 40.0000 mg | DELAYED_RELEASE_TABLET | Freq: Every day | ORAL | Status: DC

## 2012-09-09 NOTE — Progress Notes (Signed)
OFFICE PROGRESS NOTE   09/09/2012   Physicians: Madaline Guthrie, MD, Antony Blackbird MD, Sigmund Hazel MD, Gerald Leitz MD, [facility]   INTERVAL HISTORY:   Patient is seen, alone for visit, having had first carbo/taxol on 08-30-12. She has had no gCSF as of yet, but is neutropenic today (day 11 cycle 1). Main side effect otherwise was severe taxol aches beginning day 3. She is to see Dr Roselind Messier in consultation later this AM.  Patient presented to PCP Sigmund Hazel with intermittent vaginal bleeding since ~ Oct 2013. Dr Hyacinth Meeker did endometrial biopsy 06-20-12 which showed complex endometrial hyperplasia with atypia. She had pelvic and transvaginal US in [place] system on 06-26-12, which showed endometrial thickening of 2.7 cm, right ovary not clearly seen and left ovary with normal postmenopausal appearance. She was seen by Dr Gerald Leitz and referred to Dr Clifton James. She had robotic hysterectomy with BSO and bilateral pelvic and periaortic node dissection by Dr Clifton James at [facility] on 07-30-2012. Intraoperatively she had large endometrial polyp with high grade malignancy, no obvious extrauterine disease. Pathology ([facility] [place] [id number] from 07-30-2012) had high grade, poorly differentiated endometrial adenocarcinoma with solid and serous papillary components, 2 periaortic nodes negative and 9 right pelvic/11 left pelvic nodes negative. The tumor was 4.8 cm confined to polyp with no definite myometrial involvement, margins clear and no angiolymphatic invasion identified. Cycle 1 taxol carboplatin was given 08-30-12, planning 6 cycles then possible vaginal brachytherapy.  During first infusion with rate increase, patient complained of dull HA, blood shot eyes and lump in chest. Symptoms resolved quickly with brief interruption of infusion and additional benadryl, then infusion completed without difficulty.  She had severe taxol aches beginning day 3 and lasting thru day  6 or day 7. She used ibuprofen without much benefit for the aches, was not able to work for several days due to aches. She had some nausea, reflux (possibly exacerbated by the ibuprofen and steroids), and vomited day 6 after pizza. She had a difficult time eating and drinking fluids for first several days, better now, did keep down zofran. She is fatigued but has had no fever or symptoms of infection. Bowels are moving, at times a little loose and at times hard stools. She has no peripheral neuropathy symptoms. Peripheral IV access was easily obtained in left hand, however wrist above IV site was slightly tender and a little puffy after chemo, improved now. She slept well last pm. She haas hx intermittent GERD symptoms preceding chemo Remainder of 10 point Review of Systems negative.  Objective:  Vital signs in last 24 hours:  BP 135/62  Pulse 96  Temp(Src) 98 F (36.7 C) (Oral)  Resp 18  Ht 5\' 1"  (1.549 m)  Wt 194 lb 9.6 oz (88.27 kg)  BMI 36.79 kg/m2 Weight is down 10 lbs from 08-27-12. Alert, easily ambulatory, does not appear ill now. No alopecia.   HEENT:PERRLA, sclera clear, anicteric and oropharynx clear, no lesions. Mucous membranes moist.  LymphaticsCervical, supraclavicular, and axillary nodes normal. Resp: clear to auscultation bilaterally and normal percussion bilaterally Cardio: regular rate and rhythm GI: soft, not distended, few bowel sounds, not tender including epigastrium. no HSM or mass. Incision well healed. Extremities: extremities normal, atraumatic, no cyanosis or edema other than slight resolving ecchymosis dorsum left wrist above site of chemo IV in dorsum left hand. No cord, no heat or erythema, no streaking.  Neuro:no sensory deficits noted Skin with some moist slight erythema in skin fold  inferior abdomen and left wrist as above. No rash.  Lab Results:  Results for orders placed in visit on 09/09/12  CBC WITH DIFFERENTIAL      Result Value Range   WBC 1.9  (*) 3.9 - 10.3 10e3/uL   NEUT# 0.5 (*) 1.5 - 6.5 10e3/uL   HGB 11.5 (*) 11.6 - 15.9 g/dL   HCT 16.1 (*) 09.6 - 04.5 %   Platelets 211  145 - 400 10e3/uL   MCV 88.2  79.5 - 101.0 fL   MCH 30.1  25.1 - 34.0 pg   MCHC 34.1  31.5 - 36.0 g/dL   RBC 4.09  8.11 - 9.14 10e6/uL   RDW 12.3  11.2 - 14.5 %   lymph# 0.9  0.9 - 3.3 10e3/uL   MONO# 0.4  0.1 - 0.9 10e3/uL   Eosinophils Absolute 0.1  0.0 - 0.5 10e3/uL   Basophils Absolute 0.1  0.0 - 0.1 10e3/uL   NEUT% 24.5 (*) 38.4 - 76.8 %   LYMPH% 47.4  14.0 - 49.7 %   MONO% 18.8 (*) 0.0 - 14.0 %   EOS% 5.7  0.0 - 7.0 %   BASO% 3.6 (*) 0.0 - 2.0 %    We have discussed blood counts above and she is given instructions for neutropenic precautions.   Studies/Results:  No results found.  Medications: I have reviewed the patient's current medications. Neupogen given at 480 mg now per my discussion with pharmacist, and will be given also on 4-8 and 4-9. Will recheck CBC on 4-11 and give additional neupogen that day if ANC <1.0. She will begin prophylactic cipro 250 mg bid until neutropenia resolved. She will begin Protonix or equivalent daily.  With severe taxol aches thru ~day 6, we will need to begin neupogen (or neulasta) on ~ day 6 or 7 for subsequent cycles.  Assessment/Plan:  1.IA high grade endometrial carcinoma involving endometrial polyp, post optimal surgery by Dr Clifton James 07-30-12, adjuvant taxol carboplatin q 3 weeks x 6 cycles begun 08-30-12. Neutropenic today, not clear if she is past nadir yet; neupogen as above and repeat CBC at least 4-10 (or 4-9 if severe aches from the neupogen by then). Possible IVF day 4 or 5 with next cycle as she has lost 10 lbs with this first cycle, so obviously not getting in enough po. Referral to Dr Roselind Messier for consideration of radiation also. If HDR, we will plan to give chemotherapy concomitantly. I will see her back at least 4-16 prior to cycle 2 09-20-12.  2.HTN, elevated lipids  3.obesity  4.overdue  mammograms which she would like to have after chemo completes  5.melanoma in brother  Patient followed discussion well and is in agreement with plan. She will not go to work today or tomorrow with the neutropenia.   Cherice Glennie P, MD   09/09/2012, 10:05 AM

## 2012-09-09 NOTE — Progress Notes (Signed)
Complete PATIENT MEASURE OF DISTRESS worksheet with a score of 2 submitted to social work.  

## 2012-09-09 NOTE — Patient Instructions (Signed)
Avoid anyone/ any place where you might be exposed to contagious illness. Call for temperature >=100.5 or symptoms of infection. We will put you on Cipro 250 mg twice daily until absolute neutrophil count is >1.0.   Start Protonix (or equivalent if your insurance likes another one better) daily for reflux symptoms.

## 2012-09-09 NOTE — Telephone Encounter (Signed)
Gave pt appt for lab, injections, chemo and MD on April 2014

## 2012-09-09 NOTE — Progress Notes (Signed)
Patient presents to the clinic today unaccompanied for new consult with Dr. Roselind Messier to discuss the role of radiation therapy in the treatment of endometrial ca. Patient is alert and oriented to person, place, and time. No distress noted. Steady gait noted. Pleasant affect noted. Patient denies pain at this time. Patient denies vaginal discharge, odor, or itching. Patient denies hematuria. Patient denies burning with urination. Patient denies nausea and vomiting today but, does report this associated with chemotherapy. Patient denies diarrhea but, does reports an occasional loose stool. Patient denies headache, dizziness, night sweats, hot flashes, or unintentional weight loss. Patient reports decreased appetite following chemotherapy but, reports it is slowly returning. Patient denies neuropathy of hand or feet. Patient reports sleeping without difficulty. Patient reports that she had her initial chemotherapy a week ago Friday. Patient followed up with Dr. Darrold Span this morning and her neutrophils proved to be low therefore, Dr. Darrold Span prescribed Cipro. Patient working full time as a Engineer, civil (consulting) but, understands she can't return to work today because of low neutrophil count. Reported all findings to Dr. Roselind Messier.

## 2012-09-09 NOTE — Progress Notes (Signed)
See progress note under physician encounter. 

## 2012-09-09 NOTE — Telephone Encounter (Signed)
Per staff phone call and POF I have schedueld appts.  JMW  

## 2012-09-09 NOTE — Progress Notes (Signed)
Radiation Oncology         [phone]) [phone] ________________________________  Initial outpatient Consultation  Name: Diane Lozano MRN: [id number]  Date: 09/09/2012  DOB: 01/19/1956  QM:VHQION,GEXB [name], MD  Reece Packer, MD   REFERRING PHYSICIAN: Reece Packer, MD  DIAGNOSIS:  Stage I-A high grade endometrial carcinoma involving an endometrial polyp  HISTORY OF PRESENT ILLNESS::Diane Lozano is a 57 y.o. female who is seen out of the courtesy of Dr. let us say for an opinion concerning radiation therapy as part of management of patient's recently diagnosed high-grade endometrial carcinoma.  Patient presented to PCP Sigmund Hazel with intermittent vaginal bleeding since ~ Oct 2013. Dr Hyacinth Meeker did endometrial biopsy 06-20-12 which showed complex endometrial hyperplasia with atypia. She had pelvic and transvaginal ultrasound in [facility] system on 06-26-12, which showed endometrial thickening of 2.7 cm, right ovary not clearly seen and left ovary was normal postmenopausal appearance. She was seen by Dr Gerald Leitz and referred to Dr Clifton Ambrielle Kington. She had robotic hysterectomy with BSO and bilateral pelvic and periaortic node dissection by Dr Clifton Hasheem Voland at [facility] on 07-30-2012. Intraoperatively she had large endometrial polyp with high grade malignancy, no obvious extrauterine disease. Pathology showed high grade, poorly differentiated endometrial adenocarcinoma with solid and serous papillary components, 2 periaortic nodes negative and 9 right pelvic/11 left pelvic nodes negative. The tumor was 4.8 cm confined to polyp with no definite myometrial involvement, margins clear and no angiolymphatic invasion identified. The patient had an uneventful postoperative course. In light of  the high-grade nature of the patient's malignancy,  it was recommended she be considered for adjuvant chemotherapy and consideration for vaginal brachytherapy.  The patient received her first cycle of Taxol and carboplatinum which  she seemed to tolerate  well except for neutropenia. The patient has been placed on Cipro for this issue. Patient is now seen for radiation oncology for evaluation.    PREVIOUS RADIATION THERAPY: No  PAST MEDICAL HISTORY:  has a past medical history of Endometrial ca; Hypertension; and Hyperlipidemia.    PAST SURGICAL HISTORY: Past Surgical History  Procedure Laterality Date  . Cesarean section    . Cesarean section    . Abdominal hysterectomy      robotic hysterectomy with BSO and bilateral pelvic and periaortic node dissection  . Colonoscopy  2008    FAMILY HISTORY: family history includes Cancer in her brother; Dementia in her mother; Heart disease in her father; Polycystic ovary syndrome in her daughter; Renal Disease in her father; and Thyroid disease in her mother.  SOCIAL HISTORY:  reports that she quit smoking about 30 years ago. She does not have any smokeless tobacco history on file. She reports that  drinks alcohol. She reports that she does not use illicit drugs.  ALLERGIES: Review of patient's allergies indicates no known allergies.  MEDICATIONS:  Current Outpatient Prescriptions  Medication Sig Dispense Refill  . aspirin 81 MG tablet Take 81 mg by mouth daily.      . Calcium Carb-Cholecalciferol 500-600 MG-UNIT TABS Take 1 tablet by mouth daily.      . ciprofloxacin (CIPRO) 250 MG tablet Take 1 tablet (250 mg total) by mouth 2 (two) times daily. Or until no longer neutropenic.  14 tablet  0  . dexamethasone (DECADRON) 4 MG tablet Take 5 tabs = 20 mg with food 12hrs. And 6 hrs. Prior to taxol chemotherapy.  10 tablet  0  . lisinopril-hydrochlorothiazide (PRINZIDE,ZESTORETIC) 20-12.5 MG per tablet Take 1 tablet by mouth daily.      Marland Kitchen  LORazepam (ATIVAN) 1 MG tablet Take 1/2 to 1 tab every 4-6 hours under the tongue or swallow as needed for nausea.  Will make you drowsy; forgetful  20 tablet  0  . ondansetron (ZOFRAN) 8 MG tablet Take 1-2 tablets (8-16 mg total) by mouth  every 12 (twelve) hours as needed for nausea (Will not make you drowsy).  30 tablet  1  . pantoprazole (PROTONIX) 40 MG tablet Take 1 tablet (40 mg total) by mouth daily. For acid reflux  30 tablet  3  . simvastatin (ZOCOR) 40 MG tablet Take 40 mg by mouth every evening.      . venlafaxine XR (EFFEXOR-XR) 75 MG 24 hr capsule Take 75 mg by mouth daily.       No current facility-administered medications for this encounter.    REVIEW OF SYSTEMS:  A 15 point review of systems is documented in the electronic medical record. This was obtained by the nursing staff. However, I reviewed this with the patient to discuss relevant findings and make appropriate changes.  The patient had minimal bleeding in October. She denies any heavy clots. Patient denies any pelvic pain prior to diagnosis. She denies any swelling in her legs since her surgery. Patient denies any cough or breathing problems. She denies any headaches dizziness or blurred vision.   PHYSICAL EXAM:  height is 5\' 3"  (1.6 m) and weight is 195 lb 9.6 oz (88.724 kg). Her oral temperature is 97.4 F (36.3 C). Her blood pressure is 100/60 and her pulse is 84. Her respiration is 16 and oxygen saturation is 98%.   BP 100/60  Pulse 84  Temp(Src) 97.4 F (36.3 C) (Oral)  Resp 16  Ht 5\' 3"  (1.6 m)  Wt 195 lb 9.6 oz (88.724 kg)  BMI 34.66 kg/m2  SpO2 98%  General Appearance:    Alert, cooperative, no distress, appears stated age  Head:    Normocephalic, without obvious abnormality, atraumatic  Eyes:    PERRL, conjunctiva/corneas clear, EOM's intact     Ears:    Normal TM's and external ear canals, both ears  Nose:   Nares normal, septum midline, mucosa normal, no drainage    or sinus tenderness  Throat:   Lips, mucosa, and tongue normal; teeth and gums normal  Neck:   Supple, symmetrical, trachea midline, no adenopathy;    thyroid:  no enlargement/tenderness/nodules; no carotid   bruit or JVD  Back:     Symmetric, no curvature, ROM normal, no  CVA tenderness  Lungs:     Clear to auscultation bilaterally, respirations unlabored  Chest Wall:    No tenderness or deformity   Heart:    Regular rate and rhythm,  no murmur, rub   or gallop     Abdomen:     Soft, non-tender, bowel sounds active all four quadrants,    no masses, no organomegaly, small scars noted from the laparoscopic procedure, no signs of drainage or infection   Genitalia:    Normal female without lesion, discharge or tenderness, the vaginal vault is without mucosal lesions,  the cuff appears to be intact with sutures visible, no palpable pelvic mass      Extremities:   Extremities normal, atraumatic, no cyanosis or edema  Pulses:   2+ and symmetric all extremities  Skin:   Skin color, texture, turgor normal, no rashes or lesions  Lymph nodes:   Cervical, supraclavicular, and axillary nodes normal  Neurologic:   normal strength, sensation and reflexes  throughout    LABORATORY DATA:  Lab Results  Component Value Date   WBC 1.9* 09/09/2012   HGB 11.5* 09/09/2012   HCT 33.7* 09/09/2012   MCV 88.2 09/09/2012   PLT 211 09/09/2012   Lab Results  Component Value Date   NA 139 08/26/2012   K 3.8 08/26/2012   CL 103 08/26/2012   CO2 24 08/26/2012   Lab Results  Component Value Date   ALT 26 08/26/2012   AST 19 08/26/2012   ALKPHOS 78 08/26/2012   BILITOT 0.45 08/26/2012     RADIOGRAPHY: Ct Abdomen Pelvis W Contrast  08/28/2012  *RADIOLOGY REPORT*  Clinical Data: Endometrial cancer  CT ABDOMEN AND PELVIS WITH CONTRAST  Technique:  Multidetector CT imaging of the abdomen and pelvis was performed following the standard protocol during bolus administration of intravenous contrast.  Contrast: OMNIPAQUE IOHEXOL 300 MG/ML  SOLN  Comparison: Pelvic ultrasound 06/26/2012  Findings: Lung bases are clear.  Liver is diffusely mildly hypodense which may suggest hepatic steatosis.  No focal abnormality.  Gallbladder, adrenal glands, kidneys, spleen, and pancreas are normal.  No free  air, free fluid, or abdominal lymphadenopathy.  A small fat containing ventral abdominal wall hernia is identified, image 44, 3.6 cm superior to the umbilicus. There is minimal fat stranding within the hernia but no fluid collection or evidence for bowel entrapment.  Evidence of hysterectomy.  Trace free pelvic fluid identified image 64.  Ovaries are apparently surgically absent.  No adnexal mass. No pelvic sidewall, inguinal, or retroperitoneal lymphadenopathy. Bladder is normal.  No lytic or sclerotic osseous lesion.  Mild disc degenerative changes are noted in the spine.  No compression deformity.  IMPRESSION: No CT evidence for intra-abdominal or pelvic metastatic disease.  Trace free pelvic fluid, presumably postoperative although the date of surgery is not documented in the electronic medical record.   Original Report Authenticated By: Christiana Pellant, M.D.       IMPRESSION:  Stage I-A high grade endometrial carcinoma involving an endometrial polyp. The patient would be at risk for vaginal vault recurrence given the high grade nature of the malignancy with serous and clear cell components.  I would recommend vaginal brachytherapy as part of the patient's overall management.  In light of the extensive pelvic and periaortic node dissection with negative nodes, I would not recommend external beam radiation therapy as part of her overall management.  PLAN: The patient will followup with Dr. Clifton Evonna Stoltz for pelvic exam  to ensure the cuff is well-healed so that the patient can  proceed with radiation treatment.  I anticipate the patient's treatment starting with her second or third cycle of chemotherapy. She will receive 5 weekly intracavitary insertions.  I spent 60 minutes minutes face to face with the patient and more than 50% of that time was spent in counseling and/or coordination of care.   ------------------------------------------------  -----------------------------------  Billie Lade, PhD,  MD

## 2012-09-10 ENCOUNTER — Ambulatory Visit (HOSPITAL_BASED_OUTPATIENT_CLINIC_OR_DEPARTMENT_OTHER): Payer: 59

## 2012-09-10 VITALS — BP 120/65 | HR 102 | Temp 98.5°F

## 2012-09-10 DIAGNOSIS — D709 Neutropenia, unspecified: Secondary | ICD-10-CM

## 2012-09-10 DIAGNOSIS — C541 Malignant neoplasm of endometrium: Secondary | ICD-10-CM

## 2012-09-10 MED ORDER — FILGRASTIM 480 MCG/0.8ML IJ SOLN
480.0000 ug | Freq: Once | INTRAMUSCULAR | Status: AC
  Administered 2012-09-10: 480 ug via SUBCUTANEOUS
  Filled 2012-09-10: qty 0.8

## 2012-09-10 NOTE — Patient Instructions (Addendum)
Filgrastim, G-CSF injection What is this medicine? FILGRASTIM, G-CSF (fil GRA stim) stimulates the formation of white blood cells. This medicine is given to patients with conditions that may cause a decrease in white blood cells, like those receiving certain types of chemotherapy or bone marrow transplant. It helps the bone marrow recover its ability to produce white blood cells. Increasing the amount of white blood cells helps to decrease the risk of infection and fever. This medicine may be used for other purposes; ask your health care provider or pharmacist if you have questions. What should I tell my health care provider before I take this medicine? They need to know if you have any of these conditions: -currently receiving radiation therapy -sickle cell disease -an unusual or allergic reaction to filgrastim, E. coli protein, other medicines, foods, dyes, or preservatives -pregnant or trying to get pregnant -breast-feeding How should I use this medicine? This medicine is for injection into a vein or injection under the skin. It is usually given by a health care professional in a hospital or clinic setting. If you get this medicine at home, you will be taught how to prepare and give this medicine. Always change the site for the injection under the skin. Let the solution warm to room temperature before you use it. Do not shake the solution before you withdraw a dose. Throw away any unused portion. Use exactly as directed. Take your medicine at regular intervals. Do not take your medicine more often than directed. It is important that you put your used needles and syringes in a special sharps container. Do not put them in a trash can. If you do not have a sharps container, call your pharmacist or healthcare provider to get one. Talk to your pediatrician regarding the use of this medicine in children. While this medicine may be prescribed for children for selected conditions, precautions do  apply. Overdosage: If you think you have taken too much of this medicine contact a poison control center or emergency room at once. NOTE: This medicine is only for you. Do not share this medicine with others. What if I miss a dose? Try not to miss doses. If you miss a dose take the dose as soon as you remember. If it is almost time for the next dose, do not take double doses unless told to by your doctor or health care professional. What may interact with this medicine? -lithium -medicines for cancer chemotherapy This list may not describe all possible interactions. Give your health care provider a list of all the medicines, herbs, non-prescription drugs, or dietary supplements you use. Also tell them if you smoke, drink alcohol, or use illegal drugs. Some items may interact with your medicine. What should I watch for while using this medicine? Visit your doctor or health care professional for regular checks on your progress. If you get a fever or any sign of infection while you are using this medicine, do not treat yourself. Check with your doctor or health care professional. Bone pain can usually be relieved by mild pain relievers such as acetaminophen or ibuprofen. Check with your doctor or health care professional before taking these medicines as they may hide a fever. Call your doctor or health care professional if the aches and pains are severe or do not go away. What side effects may I notice from receiving this medicine? Side effects that you should report to your doctor or health care professional as soon as possible: -allergic reactions like skin rash, itching   or hives, swelling of the face, lips, or tongue -difficulty breathing, wheezing -fever -pain, redness, or swelling at the injection site -stomach or side pain, or pain at the shoulder Side effects that usually do not require medical attention (report to your doctor or health care professional if they continue or are  bothersome): -bone pain (ribs, lower back, breast bone) -headache -skin rash This list may not describe all possible side effects. Call your doctor for medical advice about side effects. You may report side effects to FDA at 1-800-FDA-1088. Where should I keep my medicine? Keep out of the reach of children. Store in a refrigerator between 2 and 8 degrees C (36 and 46 degrees F). Do not freeze or leave in direct sunlight. If vials or syringes are left out of the refrigerator for more than 24 hours, they must be thrown away. Throw away unused vials after the expiration date on the carton. NOTE: This sheet is a summary. It may not cover all possible information. If you have questions about this medicine, talk to your doctor, pharmacist, or health care provider.  2013, [employer]. (08/07/2007 1:33:21 PM)  

## 2012-09-11 ENCOUNTER — Ambulatory Visit (HOSPITAL_BASED_OUTPATIENT_CLINIC_OR_DEPARTMENT_OTHER): Payer: 59

## 2012-09-11 VITALS — BP 127/65 | HR 108 | Temp 98.6°F

## 2012-09-11 DIAGNOSIS — C541 Malignant neoplasm of endometrium: Secondary | ICD-10-CM

## 2012-09-11 DIAGNOSIS — D709 Neutropenia, unspecified: Secondary | ICD-10-CM

## 2012-09-11 MED ORDER — FILGRASTIM 480 MCG/0.8ML IJ SOLN
480.0000 ug | Freq: Once | INTRAMUSCULAR | Status: AC
  Administered 2012-09-11: 480 ug via SUBCUTANEOUS
  Filled 2012-09-11: qty 0.8

## 2012-09-12 ENCOUNTER — Ambulatory Visit: Payer: 59

## 2012-09-12 ENCOUNTER — Other Ambulatory Visit (HOSPITAL_BASED_OUTPATIENT_CLINIC_OR_DEPARTMENT_OTHER): Payer: 59 | Admitting: Lab

## 2012-09-12 DIAGNOSIS — C549 Malignant neoplasm of corpus uteri, unspecified: Secondary | ICD-10-CM

## 2012-09-12 DIAGNOSIS — C541 Malignant neoplasm of endometrium: Secondary | ICD-10-CM

## 2012-09-12 LAB — CBC WITH DIFFERENTIAL/PLATELET
BASO%: 0.6 % (ref 0.0–2.0)
Basophils Absolute: 0.2 10*3/uL — ABNORMAL HIGH (ref 0.0–0.1)
EOS%: 1.1 % (ref 0.0–7.0)
Eosinophils Absolute: 0.3 10*3/uL (ref 0.0–0.5)
HCT: 32.1 % — ABNORMAL LOW (ref 34.8–46.6)
HGB: 10.8 g/dL — ABNORMAL LOW (ref 11.6–15.9)
LYMPH%: 13 % — ABNORMAL LOW (ref 14.0–49.7)
MCH: 30.3 pg (ref 25.1–34.0)
MCHC: 33.6 g/dL (ref 31.5–36.0)
MCV: 90.2 fL (ref 79.5–101.0)
MONO#: 1.4 10*3/uL — ABNORMAL HIGH (ref 0.1–0.9)
MONO%: 5.8 % (ref 0.0–14.0)
NEUT#: 18.6 10*3/uL — ABNORMAL HIGH (ref 1.5–6.5)
NEUT%: 79.5 % — ABNORMAL HIGH (ref 38.4–76.8)
Platelets: 226 10*3/uL (ref 145–400)
RBC: 3.56 10*6/uL — ABNORMAL LOW (ref 3.70–5.45)
RDW: 12.8 % (ref 11.2–14.5)
WBC: 23.5 10*3/uL — ABNORMAL HIGH (ref 3.9–10.3)
lymph#: 3.1 10*3/uL (ref 0.9–3.3)

## 2012-09-12 NOTE — Progress Notes (Signed)
Heiress here for labs and possible injection.  Injection held as per Dr Darrold Span.  ANC 18.6.  No injection, can stop Cipro and may go back to work.  Patient notified and states understanding of instructions.

## 2012-09-18 ENCOUNTER — Ambulatory Visit (HOSPITAL_BASED_OUTPATIENT_CLINIC_OR_DEPARTMENT_OTHER): Payer: 59 | Admitting: Oncology

## 2012-09-18 ENCOUNTER — Telehealth: Payer: Self-pay | Admitting: *Deleted

## 2012-09-18 ENCOUNTER — Encounter: Payer: Self-pay | Admitting: Oncology

## 2012-09-18 ENCOUNTER — Other Ambulatory Visit: Payer: Self-pay

## 2012-09-18 ENCOUNTER — Telehealth: Payer: Self-pay | Admitting: Oncology

## 2012-09-18 ENCOUNTER — Other Ambulatory Visit (HOSPITAL_BASED_OUTPATIENT_CLINIC_OR_DEPARTMENT_OTHER): Payer: 59 | Admitting: Lab

## 2012-09-18 VITALS — BP 124/71 | HR 103 | Temp 96.9°F | Resp 20 | Ht 63.0 in | Wt 199.0 lb

## 2012-09-18 DIAGNOSIS — Z809 Family history of malignant neoplasm, unspecified: Secondary | ICD-10-CM

## 2012-09-18 DIAGNOSIS — E669 Obesity, unspecified: Secondary | ICD-10-CM

## 2012-09-18 DIAGNOSIS — C549 Malignant neoplasm of corpus uteri, unspecified: Secondary | ICD-10-CM

## 2012-09-18 DIAGNOSIS — C541 Malignant neoplasm of endometrium: Secondary | ICD-10-CM

## 2012-09-18 DIAGNOSIS — I10 Essential (primary) hypertension: Secondary | ICD-10-CM

## 2012-09-18 LAB — CBC WITH DIFFERENTIAL/PLATELET
BASO%: 1.5 % (ref 0.0–2.0)
Basophils Absolute: 0.1 10*3/uL (ref 0.0–0.1)
EOS%: 2.1 % (ref 0.0–7.0)
Eosinophils Absolute: 0.1 10*3/uL (ref 0.0–0.5)
HCT: 33.1 % — ABNORMAL LOW (ref 34.8–46.6)
HGB: 11.5 g/dL — ABNORMAL LOW (ref 11.6–15.9)
LYMPH%: 27.4 % (ref 14.0–49.7)
MCH: 31.1 pg (ref 25.1–34.0)
MCHC: 34.8 g/dL (ref 31.5–36.0)
MCV: 89.4 fL (ref 79.5–101.0)
MONO#: 0.3 10*3/uL (ref 0.1–0.9)
MONO%: 7.1 % (ref 0.0–14.0)
NEUT#: 3 10*3/uL (ref 1.5–6.5)
NEUT%: 61.9 % (ref 38.4–76.8)
Platelets: 145 10*3/uL (ref 145–400)
RBC: 3.7 10*6/uL (ref 3.70–5.45)
RDW: 12.7 % (ref 11.2–14.5)
WBC: 4.9 10*3/uL (ref 3.9–10.3)
lymph#: 1.3 10*3/uL (ref 0.9–3.3)

## 2012-09-18 LAB — COMPREHENSIVE METABOLIC PANEL (CC13)
ALT: 31 U/L (ref 0–55)
AST: 19 U/L (ref 5–34)
Albumin: 3.7 g/dL (ref 3.5–5.0)
Alkaline Phosphatase: 82 U/L (ref 40–150)
BUN: 22.7 mg/dL (ref 7.0–26.0)
CO2: 21 mEq/L — ABNORMAL LOW (ref 22–29)
Calcium: 9.7 mg/dL (ref 8.4–10.4)
Chloride: 105 mEq/L (ref 98–107)
Creatinine: 0.8 mg/dL (ref 0.6–1.1)
Glucose: 119 mg/dl — ABNORMAL HIGH (ref 70–99)
Potassium: 4.3 mEq/L (ref 3.5–5.1)
Sodium: 140 mEq/L (ref 136–145)
Total Bilirubin: 0.49 mg/dL (ref 0.20–1.20)
Total Protein: 7.6 g/dL (ref 6.4–8.3)

## 2012-09-18 MED ORDER — DEXAMETHASONE 4 MG PO TABS: ORAL_TABLET | ORAL | Status: DC

## 2012-09-18 MED ORDER — HYDROCODONE-ACETAMINOPHEN 5-325 MG PO TABS: 1.0000 | ORAL_TABLET | Freq: Four times a day (QID) | ORAL | Status: DC | PRN

## 2012-09-18 NOTE — Telephone Encounter (Signed)
Per staff message and POF I have scheduled appts.  JMW  

## 2012-09-18 NOTE — Telephone Encounter (Signed)
Per scheduler I have adjusted 4/22 appt.  JMW  

## 2012-09-18 NOTE — Patient Instructions (Signed)
Begin ibuprofen with food afternoon or evening on day 3 (=Sunday)  Fine to use some Norco for aches. Call if ibuprofen/ norco do not help enough, as we could try some extra decadron if so.  Take miralax if it has been 2 days since bowel movement (or fine to take it daily to keep bowels moving daily if you feel better that way).

## 2012-09-18 NOTE — Progress Notes (Signed)
OFFICE PROGRESS NOTE   09/18/2012   Physicians:Elizabeth Skinner, MD, Antony Blackbird MD, Sigmund Hazel MD, Gerald Leitz MD, [facility]   INTERVAL HISTORY:   Patient is seen, alone for visit, in continuing attention to adjuvant carboplatin/ taxol chemotherapy for IA high grade endometrial carcinoma. Cycle 1 was given on 08-30-12, complicated by severe taxol aches and by neutropenia by day 11 which resolved quickly with 3 doses of neupogen then. She saw Dr Roselind Messier on 09-09-12, with recommendation for vaginal brachytherapy weekly x 5, to begin probably within the next few weeks, concomitant with chemotherapy. She has felt very well this past week. She does not have PAC.  Patient presented to PCP Sigmund Hazel with intermittent vaginal bleeding since ~ Oct 2013. Dr Hyacinth Meeker did endometrial biopsy 06-20-12 which showed complex endometrial hyperplasia with atypia. She had pelvic and transvaginal US in [place] system on 06-26-12, which showed endometrial thickening of 2.7 cm, right ovary not clearly seen and left ovary with normal postmenopausal appearance. She was seen by Dr Gerald Leitz and referred to Dr Clifton James. She had robotic hysterectomy with BSO and bilateral pelvic and periaortic node dissection by Dr Clifton James at [facility] on 07-30-2012. Intraoperatively she had large endometrial polyp with high grade malignancy, no obvious extrauterine disease. Pathology ([facility] [place] [id number] from 07-30-2012) had high grade, poorly differentiated endometrial adenocarcinoma with solid and serous papillary components, 2 periaortic nodes negative and 9 right pelvic/11 left pelvic nodes negative. The tumor was 4.8 cm confined to polyp with no definite myometrial involvement, margins clear and no angiolymphatic invasion identified.  Cycle 1 taxol carboplatin was given 08-30-12,  6 cycles planned.    "This week has been great except for my hair". No nausea or vomiting, no pain, good energy,  bowels moving, no fever or symptoms of infection, no bleeding, no aches now, no peripheral neuropathy symptoms. Working regular schedule. She has not needed Protonix this week for GERD symptoms, that may have been related to NSAID use for taxol aches cycle 1. Remainder of 10 point Review of Systems negative.  Objective:  Vital signs in last 24 hours:  BP 124/71  Pulse 103  Temp(Src) 96.9 F (36.1 C) (Oral)  Resp 20  Ht 5\' 3"  (1.6 m)  Wt 199 lb (90.266 kg)  BMI 35.26 kg/m2  SpO2 98% Weight is up 4.5 lbs. Easily ambulatory, looks comfortable, just delightful. Partial alopecia.   HEENT:PERRLA, sclera clear, anicteric and oropharynx clear, no lesions LymphaticsCervical, supraclavicular, and axillary nodes normal. Resp: clear to auscultation bilaterally and normal percussion bilaterally Cardio: regular rate and rhythm GI: active BS, soft, nontender, not distended, surgical incision well healed, no appreciable HSM or mass. Extremities: extremities normal, atraumatic, no cyanosis or edema Neuro:no sensory deficits noted Skin without rash or ecchymoses  Lab Results:  Results for orders placed in visit on 09/18/12  CBC WITH DIFFERENTIAL      Result Value Range   WBC 4.9  3.9 - 10.3 10e3/uL   NEUT# 3.0  1.5 - 6.5 10e3/uL   HGB 11.5 (*) 11.6 - 15.9 g/dL   HCT 45.4 (*) 09.8 - 11.9 %   Platelets 145  145 - 400 10e3/uL   MCV 89.4  79.5 - 101.0 fL   MCH 31.1  25.1 - 34.0 pg   MCHC 34.8  31.5 - 36.0 g/dL   RBC 1.47  8.29 - 5.62 10e6/uL   RDW 12.7  11.2 - 14.5 %   lymph# 1.3  0.9 - 3.3 10e3/uL  MONO# 0.3  0.1 - 0.9 10e3/uL   Eosinophils Absolute 0.1  0.0 - 0.5 10e3/uL   Basophils Absolute 0.1  0.0 - 0.1 10e3/uL   NEUT% 61.9  38.4 - 76.8 %   LYMPH% 27.4  14.0 - 49.7 %   MONO% 7.1  0.0 - 14.0 %   EOS% 2.1  0.0 - 7.0 %   BASO% 1.5  0.0 - 2.0 %  COMPREHENSIVE METABOLIC PANEL (CC13)      Result Value Range   Sodium 140  136 - 145 mEq/L   Potassium 4.3  3.5 - 5.1 mEq/L   Chloride  105  98 - 107 mEq/L   CO2 21 (*) 22 - 29 mEq/L   Glucose 119 (*) 70 - 99 mg/dl   BUN 16.1  7.0 - 09.6 mg/dL   Creatinine 0.8  0.6 - 1.1 mg/dL   Total Bilirubin 0.45  0.20 - 1.20 mg/dL   Alkaline Phosphatase 82  40 - 150 U/L   AST 19  5 - 34 U/L   ALT 31  0 - 55 U/L   Total Protein 7.6  6.4 - 8.3 g/dL   Albumin 3.7  3.5 - 5.0 g/dL   Calcium 9.7  8.4 - 40.9 mg/dL     Studies/Results:  No results found.  Medications: I have reviewed the patient's current medications. She has been given written and oral instructions for the premedication decadron. She should use miralax if 2 days without bowel movement, tho could certainly use this daily to keep bowels moving daily also. She will begin ibuprofen with food afternoon or evening of day 3, a little ahead of time that she developed aches cycle 1. She can use Norco and ativan as previously if helpful at hs for aching. She will call if aches not tolerable, will have additional decadron at home that could be tried if so. She will use Protonix daily beginning day prior to chemo for ~ a week.  Assessment/Plan:  1.IA high grade endometrial carcinoma involving endometrial polyp, post optimal surgery by Dr Clifton James 07-30-12, adjuvant taxol carboplatin q 3 weeks x 6 cycles begun 08-30-12. Counts good today for cycle 2 on schedule 09-20-12. She may need IVF on day 4 or 5 (RN to assess by phone then) and will have neupogen 4-25, 26, 28 (timing to avoid worst of taxol aches). I will see her back with lab 4-29, or sooner if needed. She understands that we will not decrease taxol dose for aches now. Medication instructions as above. 2.HTN, elevated lipids  3.obesity  4.overdue mammograms, which she would like to have after chemo completes  5. Family hx melanoma  Sanskriti Greenlaw P, MD   09/18/2012, 10:19 AM

## 2012-09-18 NOTE — Telephone Encounter (Signed)
lvm for pt regarding to 4.18.14 appt and to pick up new appt schedule at nxt visit

## 2012-09-19 ENCOUNTER — Telehealth: Payer: Self-pay | Admitting: Oncology

## 2012-09-19 NOTE — Telephone Encounter (Signed)
lvm for pt regarding to time of 4.29.14 appt.Marland KitchenMarland KitchenMarland Kitchen

## 2012-09-20 ENCOUNTER — Other Ambulatory Visit (HOSPITAL_BASED_OUTPATIENT_CLINIC_OR_DEPARTMENT_OTHER): Payer: 59 | Admitting: Lab

## 2012-09-20 ENCOUNTER — Ambulatory Visit (HOSPITAL_BASED_OUTPATIENT_CLINIC_OR_DEPARTMENT_OTHER): Payer: 59

## 2012-09-20 VITALS — BP 166/71 | HR 113 | Temp 97.7°F | Resp 18

## 2012-09-20 DIAGNOSIS — C541 Malignant neoplasm of endometrium: Secondary | ICD-10-CM

## 2012-09-20 DIAGNOSIS — C549 Malignant neoplasm of corpus uteri, unspecified: Secondary | ICD-10-CM

## 2012-09-20 DIAGNOSIS — Z5111 Encounter for antineoplastic chemotherapy: Secondary | ICD-10-CM

## 2012-09-20 LAB — CBC WITH DIFFERENTIAL/PLATELET
BASO%: 1.3 % (ref 0.0–2.0)
Basophils Absolute: 0 10*3/uL (ref 0.0–0.1)
EOS%: 0.3 % (ref 0.0–7.0)
Eosinophils Absolute: 0 10*3/uL (ref 0.0–0.5)
HCT: 32.1 % — ABNORMAL LOW (ref 34.8–46.6)
HGB: 11 g/dL — ABNORMAL LOW (ref 11.6–15.9)
LYMPH%: 15.4 % (ref 14.0–49.7)
MCH: 30.3 pg (ref 25.1–34.0)
MCHC: 34.3 g/dL (ref 31.5–36.0)
MCV: 88.4 fL (ref 79.5–101.0)
MONO#: 0 10*3/uL — ABNORMAL LOW (ref 0.1–0.9)
MONO%: 1.3 % (ref 0.0–14.0)
NEUT#: 2.5 10*3/uL (ref 1.5–6.5)
NEUT%: 81.7 % — ABNORMAL HIGH (ref 38.4–76.8)
Platelets: 125 10*3/uL — ABNORMAL LOW (ref 145–400)
RBC: 3.63 10*6/uL — ABNORMAL LOW (ref 3.70–5.45)
RDW: 12.6 % (ref 11.2–14.5)
WBC: 3.1 10*3/uL — ABNORMAL LOW (ref 3.9–10.3)
lymph#: 0.5 10*3/uL — ABNORMAL LOW (ref 0.9–3.3)
nRBC: 0 % (ref 0–0)

## 2012-09-20 MED ORDER — FAMOTIDINE IN NACL 20-0.9 MG/50ML-% IV SOLN
20.0000 mg | Freq: Once | INTRAVENOUS | Status: AC
  Administered 2012-09-20: 20 mg via INTRAVENOUS

## 2012-09-20 MED ORDER — PACLITAXEL CHEMO INJECTION 300 MG/50ML
175.0000 mg/m2 | Freq: Once | INTRAVENOUS | Status: AC
  Administered 2012-09-20: 348 mg via INTRAVENOUS
  Filled 2012-09-20: qty 58

## 2012-09-20 MED ORDER — DIPHENHYDRAMINE HCL 50 MG/ML IJ SOLN
50.0000 mg | Freq: Once | INTRAMUSCULAR | Status: AC
  Administered 2012-09-20: 50 mg via INTRAVENOUS

## 2012-09-20 MED ORDER — DEXAMETHASONE SODIUM PHOSPHATE 4 MG/ML IJ SOLN
20.0000 mg | Freq: Once | INTRAMUSCULAR | Status: AC
  Administered 2012-09-20: 20 mg via INTRAVENOUS

## 2012-09-20 MED ORDER — SODIUM CHLORIDE 0.9 % IV SOLN
680.0000 mg | Freq: Once | INTRAVENOUS | Status: AC
  Administered 2012-09-20: 680 mg via INTRAVENOUS
  Filled 2012-09-20: qty 68

## 2012-09-20 MED ORDER — ONDANSETRON 16 MG/50ML IVPB (CHCC)
16.0000 mg | Freq: Once | INTRAVENOUS | Status: AC
  Administered 2012-09-20: 16 mg via INTRAVENOUS

## 2012-09-20 MED ORDER — SODIUM CHLORIDE 0.9 % IV SOLN
Freq: Once | INTRAVENOUS | Status: AC
  Administered 2012-09-20: 11:00:00 via INTRAVENOUS

## 2012-09-20 NOTE — Patient Instructions (Addendum)
 Cancer Center Discharge Instructions for Patients Receiving Chemotherapy  Today you received the following chemotherapy agents:  Taxol/Carboplatin  To help prevent nausea and vomiting after your treatment, we encourage you to take your nausea medication   If you develop nausea and vomiting that is not controlled by your nausea medication, call the clinic. If it is after clinic hours your family physician or the after hours number for the clinic or go to the Emergency Department.   BELOW ARE SYMPTOMS THAT SHOULD BE REPORTED IMMEDIATELY:  *FEVER GREATER THAN 100.5 F  *CHILLS WITH OR WITHOUT FEVER  NAUSEA AND VOMITING THAT IS NOT CONTROLLED WITH YOUR NAUSEA MEDICATION  *UNUSUAL SHORTNESS OF BREATH  *UNUSUAL BRUISING OR BLEEDING  TENDERNESS IN MOUTH AND THROAT WITH OR WITHOUT PRESENCE OF ULCERS  *URINARY PROBLEMS  *BOWEL PROBLEMS  UNUSUAL RASH Items with * indicate a potential emergency and should be followed up as soon as possible.   Please let the nurse know about any problems that you may have experienced. Feel free to call the clinic you have any questions or concerns. The clinic phone number is [phone].  I have been informed and understand all the instructions given to me. I know to contact the clinic, my physician, or go to the Emergency Department if any problems should occur. I do not have any questions at this time, but understand that I may call the clinic during office hours   should I have any questions or need assistance in obtaining follow up care.    __________________________________________  _____________  __________ Signature of Patient or Authorized Representative            Date                   Time    __________________________________________ Nurse's Signature

## 2012-09-23 ENCOUNTER — Telehealth: Payer: Self-pay

## 2012-09-23 NOTE — Telephone Encounter (Signed)
Ms. Wyke is feeling better today.  She felt light headed yesterday at church. She vomited and passed out per Dr. Lestine Box.   Ms. Kosel feels better today.  She is getting fluids in , however, not 64 oz.  Encouraged her to come in for IVF today.  Patient preferred to push fluids at home.  She will drink 8 oz of fluid every hour or so today and get in 64 oz. Daily.  She will call tomorrow morning if she feels she needs IVF. She appreciated follow up.

## 2012-09-25 ENCOUNTER — Other Ambulatory Visit: Payer: 59 | Admitting: Lab

## 2012-09-25 ENCOUNTER — Ambulatory Visit: Payer: 59 | Admitting: Oncology

## 2012-09-27 ENCOUNTER — Ambulatory Visit (HOSPITAL_BASED_OUTPATIENT_CLINIC_OR_DEPARTMENT_OTHER): Payer: 59

## 2012-09-27 ENCOUNTER — Telehealth: Payer: Self-pay | Admitting: *Deleted

## 2012-09-27 ENCOUNTER — Other Ambulatory Visit: Payer: Self-pay | Admitting: Oncology

## 2012-09-27 VITALS — BP 117/67 | HR 116 | Temp 98.1°F

## 2012-09-27 DIAGNOSIS — C541 Malignant neoplasm of endometrium: Secondary | ICD-10-CM

## 2012-09-27 DIAGNOSIS — D709 Neutropenia, unspecified: Secondary | ICD-10-CM

## 2012-09-27 MED ORDER — FILGRASTIM 480 MCG/0.8ML IJ SOLN
480.0000 ug | Freq: Once | INTRAMUSCULAR | Status: AC
  Administered 2012-09-27: 480 ug via SUBCUTANEOUS
  Filled 2012-09-27: qty 0.8

## 2012-09-27 NOTE — Telephone Encounter (Signed)
Pt left voice mail stating she had vomited all night and bowels were cramping. Called patient to discuss and she states she took miralax last night and had diarrhea this morning. No further vomiting this morning/still nauseated. Instructed to use ativan SL and then try to drink some clear liquids. Husband is driving her to [facility] for injection early afternoon today. Will talk with injection nurse when she gets here and left her know how she tolerated fluids.

## 2012-09-28 ENCOUNTER — Ambulatory Visit (HOSPITAL_BASED_OUTPATIENT_CLINIC_OR_DEPARTMENT_OTHER): Payer: 59

## 2012-09-28 VITALS — BP 125/76 | HR 103 | Temp 97.7°F

## 2012-09-28 DIAGNOSIS — C541 Malignant neoplasm of endometrium: Secondary | ICD-10-CM

## 2012-09-28 DIAGNOSIS — D709 Neutropenia, unspecified: Secondary | ICD-10-CM

## 2012-09-28 MED ORDER — FILGRASTIM 480 MCG/0.8ML IJ SOLN
480.0000 ug | Freq: Once | INTRAMUSCULAR | Status: AC
  Administered 2012-09-28: 480 ug via SUBCUTANEOUS

## 2012-09-30 ENCOUNTER — Ambulatory Visit (HOSPITAL_BASED_OUTPATIENT_CLINIC_OR_DEPARTMENT_OTHER): Payer: 59

## 2012-09-30 VITALS — BP 127/64 | HR 101 | Temp 97.9°F

## 2012-09-30 DIAGNOSIS — C541 Malignant neoplasm of endometrium: Secondary | ICD-10-CM

## 2012-09-30 DIAGNOSIS — C549 Malignant neoplasm of corpus uteri, unspecified: Secondary | ICD-10-CM

## 2012-09-30 DIAGNOSIS — Z5189 Encounter for other specified aftercare: Secondary | ICD-10-CM

## 2012-09-30 MED ORDER — FILGRASTIM 480 MCG/0.8ML IJ SOLN
480.0000 ug | Freq: Once | INTRAMUSCULAR | Status: AC
  Administered 2012-09-30: 480 ug via SUBCUTANEOUS
  Filled 2012-09-30: qty 0.8

## 2012-10-01 ENCOUNTER — Telehealth: Payer: Self-pay | Admitting: Oncology

## 2012-10-01 ENCOUNTER — Encounter: Payer: Self-pay | Admitting: Oncology

## 2012-10-01 ENCOUNTER — Ambulatory Visit (HOSPITAL_BASED_OUTPATIENT_CLINIC_OR_DEPARTMENT_OTHER): Payer: 59 | Admitting: Oncology

## 2012-10-01 ENCOUNTER — Ambulatory Visit: Payer: 59 | Admitting: Oncology

## 2012-10-01 ENCOUNTER — Other Ambulatory Visit: Payer: 59 | Admitting: Lab

## 2012-10-01 ENCOUNTER — Other Ambulatory Visit (HOSPITAL_BASED_OUTPATIENT_CLINIC_OR_DEPARTMENT_OTHER): Payer: 59 | Admitting: Lab

## 2012-10-01 VITALS — BP 116/67 | HR 105 | Temp 97.8°F | Resp 18 | Ht 63.0 in | Wt 195.8 lb

## 2012-10-01 DIAGNOSIS — R031 Nonspecific low blood-pressure reading: Secondary | ICD-10-CM

## 2012-10-01 DIAGNOSIS — C549 Malignant neoplasm of corpus uteri, unspecified: Secondary | ICD-10-CM

## 2012-10-01 DIAGNOSIS — D649 Anemia, unspecified: Secondary | ICD-10-CM

## 2012-10-01 DIAGNOSIS — C541 Malignant neoplasm of endometrium: Secondary | ICD-10-CM

## 2012-10-01 LAB — COMPREHENSIVE METABOLIC PANEL (CC13)
ALT: 19 U/L (ref 0–55)
AST: 16 U/L (ref 5–34)
Albumin: 3.6 g/dL (ref 3.5–5.0)
Alkaline Phosphatase: 82 U/L (ref 40–150)
BUN: 17.8 mg/dL (ref 7.0–26.0)
CO2: 24 mEq/L (ref 22–29)
Calcium: 9.7 mg/dL (ref 8.4–10.4)
Chloride: 100 mEq/L (ref 98–107)
Creatinine: 0.8 mg/dL (ref 0.6–1.1)
Glucose: 99 mg/dl (ref 70–99)
Potassium: 3.5 mEq/L (ref 3.5–5.1)
Sodium: 138 mEq/L (ref 136–145)
Total Bilirubin: 0.29 mg/dL (ref 0.20–1.20)
Total Protein: 7.3 g/dL (ref 6.4–8.3)

## 2012-10-01 LAB — CBC WITH DIFFERENTIAL/PLATELET
BASO%: 0.7 % (ref 0.0–2.0)
Basophils Absolute: 0.1 10*3/uL (ref 0.0–0.1)
EOS%: 2.5 % (ref 0.0–7.0)
Eosinophils Absolute: 0.3 10*3/uL (ref 0.0–0.5)
HCT: 27 % — ABNORMAL LOW (ref 34.8–46.6)
HGB: 9.3 g/dL — ABNORMAL LOW (ref 11.6–15.9)
LYMPH%: 17 % (ref 14.0–49.7)
MCH: 30.9 pg (ref 25.1–34.0)
MCHC: 34.5 g/dL (ref 31.5–36.0)
MCV: 89.3 fL (ref 79.5–101.0)
MONO#: 1 10*3/uL — ABNORMAL HIGH (ref 0.1–0.9)
MONO%: 9.5 % (ref 0.0–14.0)
NEUT#: 7 10*3/uL — ABNORMAL HIGH (ref 1.5–6.5)
NEUT%: 70.3 % (ref 38.4–76.8)
Platelets: 153 10*3/uL (ref 145–400)
RBC: 3.02 10*6/uL — ABNORMAL LOW (ref 3.70–5.45)
RDW: 12.8 % (ref 11.2–14.5)
WBC: 10 10*3/uL (ref 3.9–10.3)
lymph#: 1.7 10*3/uL (ref 0.9–3.3)

## 2012-10-01 NOTE — Patient Instructions (Signed)
Ferrous gluconate or ferrous sulfate 325 mg once daily, on empty stomach with OJ if tolerated for best absorption, otherwise with food.  These are OTC but you may have to ask pharmacist for them  Senokot S or pharmacy equivalent 1-2 once or twice daily to keep bowels moving daily. Begin at least day of chemo. Add miralax to this if needed.  Push fluids. Pedialyte or sports drinks if you can tolerate.  Hold BP med if systolic <=130

## 2012-10-01 NOTE — Progress Notes (Signed)
OFFICE PROGRESS NOTE   10/01/2012   Physicians:Elizabeth Skinner, MD, Antony Blackbird MD, Sigmund Hazel MD, Gerald Leitz MD, [facility]   INTERVAL HISTORY Patient is seen, alone for visit, in continuing attention to chemotherapy underway for IA high grade endometrial cancer, cycle 2 full dose taxol/ carboplatin given 09-20-12. She had neupogen 4-25, 4-26, 4-28 (due to neutropenia, dates due to timing of taxol aches cycle 1). This second cycle was complicated by a syncopal episode day 3 (at church on Easter), weakness and bowel problems, but she did not have the severe taxol aches that she experienced with cycle 1 (with no change in taxol dose) and tolerated the neupogen without difficulty. She does not have central catheter.  She is to see Dr Clifton James 10-03-12 and understands that she may be able to start HDR by Dr Roselind Messier after this exam.  Oncology History Patient presented to PCP Sigmund Hazel with intermittent vaginal bleeding since ~ Oct 2013. Dr Hyacinth Meeker did endometrial biopsy 06-20-12 which showed complex endometrial hyperplasia with atypia. She had pelvic and transvaginal US in [place] system on 06-26-12, which showed endometrial thickening of 2.7 cm, right ovary not clearly seen and left ovary with normal postmenopausal appearance. She was seen by Dr Gerald Leitz and referred to Dr Clifton James. She had robotic hysterectomy with BSO and bilateral pelvic and periaortic node dissection by Dr Clifton James at [facility] on 07-30-2012. Intraoperatively she had large endometrial polyp with high grade malignancy, no obvious extrauterine disease. Pathology ([facility] [place] [id number] from 07-30-2012) had high grade, poorly differentiated endometrial adenocarcinoma with solid and serous papillary components, 2 periaortic nodes negative and 9 right pelvic/11 left pelvic nodes negative. The tumor was 4.8 cm confined to polyp with no definite myometrial involvement, margins clear and no  angiolymphatic invasion identified.  Cycle 1 taxol carboplatin was given 08-30-12, 6 cycles planned.  Review of Systems As above, felt weak and was syncopal/ vomited at church on day 3, then slept most of next 2 days. Very constipated by day 7 despite miralax, poor po's with that + nausea, then diarrhea after bowels finally moved.  No significant taxol aches. Continued to take lisinopril/ HCTZ that she has been on for years, has not checked BP at home. Feeling better overall past 2 days, tho notices tachycardia with exertion. Bowels moving some better now and taking po's better. No fever or symptoms of infection. No bleeding. Remainder of 10 point Review of Systems negative.  Objective:  Vital signs in last 24 hours: Weight 195 lb12 oz, 116/67, HR 105 regular, resp 18, temp 97.8. Easily ambulatory, alert and easily conversant, NAD and very pleasant as always.  HEENT:PERRLA, extra ocular movement intact and sclera clear, anicteric. Alopecia LymphaticsCervical, supraclavicular, and axillary nodes normal. No inguinal adenopathy Resp: clear to auscultation bilaterally and normal percussion bilaterally Cardio: regular rate and rhythm GI: soft, not tender, not distended, quiet, no HSM or mass Extremities: extremities normal, atraumatic, no cyanosis or edema Neuro:no sensory deficits noted Breast:normal without suspicious masses, skin or nipple changes or axillary nodes No central catheter Skin without rash or ecchymosis  Lab Results:  CBC today with WBC 10.0, ANC 7.0, Hgb 9.3 and plt 153k.  This hemoglobin is down from 11 on 09-20-12.  CMET available after visit entirely normal, with Na 138, K 3.5, glucose 99, BUN 17.8, creat 0.8, LFTs normal T bili 0.29, Tprot 7.3, alb 3.6  Studies/Results:  No results found.  Medications: I have reviewed the patient's current medications. She will  begin ferrous fumarate or ferrous gluconate 325 mg daily, on empty stomach with OJ if tolerates. She will try  using senokotS 1-2 once - twice daily at least beginning day of chemo, with addition of miralax if needed to keep bowels moving daily. She will follow BPs at home and hold lisinopril/ HCTZ if systolic <=130.    We have discussed symptoms during the several days after chemo, adjustments in medications as above, including antihypertensive as she has lost weight and may not need that medication for now. She will push fluids. We will follow the hemoglobin with addition of iron, and she will let me know if more symptomatic before next evaluation. She understands that she should call if unable to take po's very well after next treatments, as she will need IVF if so.  Assessment/Plan:  1.IA high grade endometrial carcinoma involving endometrial polyp, post optimal surgery by Dr Clifton James 07-30-12, adjuvant taxol carboplatin q 3 weeks x 6 cycles begun 08-30-12.  Medication instructions as above. We will be sure counts adequate prior to cycle 3 chemo due 10-11-12, then will use neupogen 5-16, 5-17 and 5-19. I will see her 5-21 with repeat chemistries, or sooner if needed. Follow up with Dr Clifton James later this week, then HDR planned by Dr Roselind Messier. 2.Lower BP today and several similar readings at this office, also weakness and episode of syncope when po fluid intake poor just after chemo: she will follow BP at home, push fluids and decrease medication as above.  3.anemia: appears primarily from chemo. Will begin oral iron if tolerates and follow. 4.overdue mammograms, which she would like to have after chemo completes  5. Family hx melanoma  Patient was comfortable with discussion and plan.  LIVESAY,LENNIS P, MD   10/01/2012, 8:06 AM

## 2012-10-01 NOTE — Telephone Encounter (Signed)
lmonvm for pt re appt for 5/6 w/BN. Also confirmed 5/9 appt.

## 2012-10-02 ENCOUNTER — Telehealth: Payer: Self-pay | Admitting: Oncology

## 2012-10-02 ENCOUNTER — Other Ambulatory Visit: Payer: Self-pay | Admitting: *Deleted

## 2012-10-02 ENCOUNTER — Telehealth: Payer: Self-pay | Admitting: *Deleted

## 2012-10-02 DIAGNOSIS — C541 Malignant neoplasm of endometrium: Secondary | ICD-10-CM

## 2012-10-02 NOTE — Telephone Encounter (Signed)
lvm for pt regarding to 5.7 lab.Marland KitchenMarland KitchenMarland Kitchen

## 2012-10-02 NOTE — Telephone Encounter (Signed)
Left message to call regarding lab appointment

## 2012-10-02 NOTE — Telephone Encounter (Signed)
Message copied by Carola Rhine A on Wed Oct 02, 2012  3:53 PM ------      Message from: Reece Packer      Created: Tue Oct 01, 2012  9:13 PM       I would like to get CBC 5-7 or 5-8 before tx 5-9. Please set up if she is ok with this, whatever time of day she prefers with her work schedule. RN please watch out for the CBC that day, so we can let her know if counts ok for premed steroids/ treatment.       (She did not get rest of her schedule at visit 4-29, was going to pick it up day of rx 5-9) ------

## 2012-10-08 ENCOUNTER — Ambulatory Visit: Payer: 59 | Admitting: Nutrition

## 2012-10-08 NOTE — Progress Notes (Signed)
Patient is a 57 year old female diagnosed with endometrial cancer. She is a patient of Dr. Darrold Span.  Past medical history includes hypertension and hyperlipidemia.  Medications include Decadron,fergon, Ativan, Zofran, Protonix, Zocor, and Effexor.  Labs were reviewed.  Height: 63 inches. Weight: 195.8 pounds April 29. Usual body weight 205 pounds March 2014. BMI: 34.69.  Patient reports that she has experienced a poor appetite along with nausea. She states she does occasionally have vomiting. She reports constipation and fatigue. She states she is status post 2 cycles of her chemotherapy. She has lost approximately 10 pounds.  Nutrition diagnosis: Food and nutrition related knowledge deficit related to diagnosis of endometrial cancer and associated treatments as evidenced by no prior need for nutrition related information.  Intervention: Patient was educated on strategies for consuming small amounts of food often throughout the day. I've encouraged increased fluids especially the day of and day after chemotherapy. I've educated patient on eating if she has diarrhea. We have also discussed strategies for eating with constipation.  Monitoring, evaluation, goals: Patient will tolerate adequate calories and protein to promote weight maintenance and improve nutrition impact symptoms.  Next visit: Friday, May 30, during chemotherapy.

## 2012-10-09 ENCOUNTER — Telehealth: Payer: Self-pay

## 2012-10-09 ENCOUNTER — Other Ambulatory Visit (HOSPITAL_BASED_OUTPATIENT_CLINIC_OR_DEPARTMENT_OTHER): Payer: 59 | Admitting: Lab

## 2012-10-09 ENCOUNTER — Other Ambulatory Visit: Payer: Self-pay | Admitting: Oncology

## 2012-10-09 DIAGNOSIS — C549 Malignant neoplasm of corpus uteri, unspecified: Secondary | ICD-10-CM

## 2012-10-09 DIAGNOSIS — C541 Malignant neoplasm of endometrium: Secondary | ICD-10-CM

## 2012-10-09 LAB — CBC WITH DIFFERENTIAL/PLATELET
BASO%: 0.6 % (ref 0.0–2.0)
Basophils Absolute: 0 10*3/uL (ref 0.0–0.1)
EOS%: 2 % (ref 0.0–7.0)
Eosinophils Absolute: 0.1 10*3/uL (ref 0.0–0.5)
HCT: 29.1 % — ABNORMAL LOW (ref 34.8–46.6)
HGB: 9.6 g/dL — ABNORMAL LOW (ref 11.6–15.9)
LYMPH%: 28.3 % (ref 14.0–49.7)
MCH: 30.7 pg (ref 25.1–34.0)
MCHC: 33 g/dL (ref 31.5–36.0)
MCV: 93 fL (ref 79.5–101.0)
MONO#: 0.2 10*3/uL (ref 0.1–0.9)
MONO%: 4.8 % (ref 0.0–14.0)
NEUT#: 2.3 10*3/uL (ref 1.5–6.5)
NEUT%: 64.3 % (ref 38.4–76.8)
Platelets: 156 10*3/uL (ref 145–400)
RBC: 3.13 10*6/uL — ABNORMAL LOW (ref 3.70–5.45)
RDW: 15.3 % — ABNORMAL HIGH (ref 11.2–14.5)
WBC: 3.6 10*3/uL — ABNORMAL LOW (ref 3.9–10.3)
lymph#: 1 10*3/uL (ref 0.9–3.3)

## 2012-10-09 NOTE — Telephone Encounter (Signed)
Left a message for Ms. Barth Kirks on home and cell phone that her WBC was good for her treatment on 10-11-12.  She can take her pre med steroids as directed.

## 2012-10-11 ENCOUNTER — Other Ambulatory Visit (HOSPITAL_BASED_OUTPATIENT_CLINIC_OR_DEPARTMENT_OTHER): Payer: 59 | Admitting: Lab

## 2012-10-11 ENCOUNTER — Ambulatory Visit (HOSPITAL_BASED_OUTPATIENT_CLINIC_OR_DEPARTMENT_OTHER): Payer: 59

## 2012-10-11 VITALS — BP 120/59 | HR 99 | Temp 98.1°F

## 2012-10-11 DIAGNOSIS — C549 Malignant neoplasm of corpus uteri, unspecified: Secondary | ICD-10-CM

## 2012-10-11 DIAGNOSIS — C541 Malignant neoplasm of endometrium: Secondary | ICD-10-CM

## 2012-10-11 DIAGNOSIS — Z5111 Encounter for antineoplastic chemotherapy: Secondary | ICD-10-CM

## 2012-10-11 LAB — CBC WITH DIFFERENTIAL/PLATELET
BASO%: 0.3 % (ref 0.0–2.0)
Basophils Absolute: 0 10*3/uL (ref 0.0–0.1)
EOS%: 0 % (ref 0.0–7.0)
Eosinophils Absolute: 0 10*3/uL (ref 0.0–0.5)
HCT: 29.4 % — ABNORMAL LOW (ref 34.8–46.6)
HGB: 9.8 g/dL — ABNORMAL LOW (ref 11.6–15.9)
LYMPH%: 10.4 % — ABNORMAL LOW (ref 14.0–49.7)
MCH: 30.9 pg (ref 25.1–34.0)
MCHC: 33.3 g/dL (ref 31.5–36.0)
MCV: 92.7 fL (ref 79.5–101.0)
MONO#: 0 10*3/uL — ABNORMAL LOW (ref 0.1–0.9)
MONO%: 0.6 % (ref 0.0–14.0)
NEUT#: 2.8 10*3/uL (ref 1.5–6.5)
NEUT%: 88.7 % — ABNORMAL HIGH (ref 38.4–76.8)
Platelets: 245 10*3/uL (ref 145–400)
RBC: 3.17 10*6/uL — ABNORMAL LOW (ref 3.70–5.45)
RDW: 15.6 % — ABNORMAL HIGH (ref 11.2–14.5)
WBC: 3.2 10*3/uL — ABNORMAL LOW (ref 3.9–10.3)
lymph#: 0.3 10*3/uL — ABNORMAL LOW (ref 0.9–3.3)
nRBC: 0 % (ref 0–0)

## 2012-10-11 MED ORDER — DEXAMETHASONE SODIUM PHOSPHATE 20 MG/5ML IJ SOLN
20.0000 mg | Freq: Once | INTRAMUSCULAR | Status: AC
  Administered 2012-10-11: 20 mg via INTRAVENOUS

## 2012-10-11 MED ORDER — ONDANSETRON 16 MG/50ML IVPB (CHCC)
16.0000 mg | Freq: Once | INTRAVENOUS | Status: AC
  Administered 2012-10-11: 16 mg via INTRAVENOUS

## 2012-10-11 MED ORDER — FAMOTIDINE IN NACL 20-0.9 MG/50ML-% IV SOLN
20.0000 mg | Freq: Once | INTRAVENOUS | Status: AC
  Administered 2012-10-11: 20 mg via INTRAVENOUS

## 2012-10-11 MED ORDER — SODIUM CHLORIDE 0.9 % IV SOLN
Freq: Once | INTRAVENOUS | Status: AC
  Administered 2012-10-11: 11:00:00 via INTRAVENOUS

## 2012-10-11 MED ORDER — PACLITAXEL CHEMO INJECTION 300 MG/50ML
175.0000 mg/m2 | Freq: Once | INTRAVENOUS | Status: AC
  Administered 2012-10-11: 348 mg via INTRAVENOUS
  Filled 2012-10-11: qty 58

## 2012-10-11 MED ORDER — SODIUM CHLORIDE 0.9 % IV SOLN
680.0000 mg | Freq: Once | INTRAVENOUS | Status: AC
  Administered 2012-10-11: 680 mg via INTRAVENOUS
  Filled 2012-10-11: qty 68

## 2012-10-11 MED ORDER — DIPHENHYDRAMINE HCL 50 MG/ML IJ SOLN
50.0000 mg | Freq: Once | INTRAMUSCULAR | Status: AC
  Administered 2012-10-11: 50 mg via INTRAVENOUS

## 2012-10-11 NOTE — Patient Instructions (Addendum)
[facility] Discharge Instructions for Patients Receiving Chemotherapy  Today you received the following chemotherapy agents taxol and carboplatin.  To help prevent nausea and vomiting after your treatment, we encourage you to take your nausea medication zofran and ativan. Begin taking it tonight and take it as often as prescribed.   If you develop nausea and vomiting that is not controlled by your nausea medication, call the clinic. If it is after clinic hours your family physician or the after hours number for the clinic or go to the Emergency Department.   BELOW ARE SYMPTOMS THAT SHOULD BE REPORTED IMMEDIATELY:  *FEVER GREATER THAN 100.5 F  *CHILLS WITH OR WITHOUT FEVER  NAUSEA AND VOMITING THAT IS NOT CONTROLLED WITH YOUR NAUSEA MEDICATION  *UNUSUAL SHORTNESS OF BREATH  *UNUSUAL BRUISING OR BLEEDING  TENDERNESS IN MOUTH AND THROAT WITH OR WITHOUT PRESENCE OF ULCERS  *URINARY PROBLEMS  *BOWEL PROBLEMS  UNUSUAL RASH Items with * indicate a potential emergency and should be followed up as soon as possible.   Feel free to call the clinic you have any questions or concerns. The clinic phone number is [phone].   I have been informed and understand all the instructions given to me. I know to contact the clinic, my physician, or go to the Emergency Department if any problems should occur. I do not have any questions at this time, but understand that I may call the clinic during office hours   should I have any questions or need assistance in obtaining follow up care.    __________________________________________  _____________  __________ Signature of Patient or Authorized Representative            Date                   Time    __________________________________________ Nurse's Signature

## 2012-10-15 ENCOUNTER — Other Ambulatory Visit: Payer: Self-pay

## 2012-10-15 ENCOUNTER — Other Ambulatory Visit: Payer: Self-pay | Admitting: Oncology

## 2012-10-15 DIAGNOSIS — J069 Acute upper respiratory infection, unspecified: Secondary | ICD-10-CM

## 2012-10-15 DIAGNOSIS — C541 Malignant neoplasm of endometrium: Secondary | ICD-10-CM

## 2012-10-15 MED ORDER — AMOXICILLIN-POT CLAVULANATE 875-125 MG PO TABS: 1.0000 | ORAL_TABLET | Freq: Two times a day (BID) | ORAL | Status: DC

## 2012-10-15 NOTE — Progress Notes (Signed)
Medical Oncology  Pt call to RN this PM to report upper and lower respiratory infection with yellow drainage and productive cough, and painful left sided tooth, not sure which one. Afebrile. Last chemo 10-11-12 and was due to start neupogen on 10-18-12. Augmentin 875 mg q 12 hrs #14 called to [facility] [place] [phone] to begin tonight. Will have her see midlevel with CBC on 09-16-12 and will begin 3 days of neupogen on 4-14 rather than delay to 09-18-12.  Ila Mcgill, MD

## 2012-10-15 NOTE — Progress Notes (Signed)
Pt. Notified of Augmentin called in to [facility].  Set up for lab and visit with Tiana Loft PA-C and  neupogen injection tomorrow at 1145.  Pt. Verbalized understanding.

## 2012-10-16 ENCOUNTER — Other Ambulatory Visit (HOSPITAL_BASED_OUTPATIENT_CLINIC_OR_DEPARTMENT_OTHER): Payer: 59 | Admitting: Lab

## 2012-10-16 ENCOUNTER — Ambulatory Visit (HOSPITAL_BASED_OUTPATIENT_CLINIC_OR_DEPARTMENT_OTHER): Payer: 59

## 2012-10-16 ENCOUNTER — Ambulatory Visit (HOSPITAL_BASED_OUTPATIENT_CLINIC_OR_DEPARTMENT_OTHER): Payer: 59 | Admitting: Physician Assistant

## 2012-10-16 ENCOUNTER — Encounter: Payer: Self-pay | Admitting: Physician Assistant

## 2012-10-16 ENCOUNTER — Other Ambulatory Visit: Payer: Self-pay | Admitting: Oncology

## 2012-10-16 VITALS — BP 135/83 | HR 118 | Temp 97.7°F | Resp 16 | Ht 63.0 in | Wt 194.6 lb

## 2012-10-16 DIAGNOSIS — J40 Bronchitis, not specified as acute or chronic: Secondary | ICD-10-CM

## 2012-10-16 DIAGNOSIS — C541 Malignant neoplasm of endometrium: Secondary | ICD-10-CM

## 2012-10-16 DIAGNOSIS — D709 Neutropenia, unspecified: Secondary | ICD-10-CM

## 2012-10-16 DIAGNOSIS — D649 Anemia, unspecified: Secondary | ICD-10-CM

## 2012-10-16 DIAGNOSIS — C549 Malignant neoplasm of corpus uteri, unspecified: Secondary | ICD-10-CM

## 2012-10-16 LAB — CBC WITH DIFFERENTIAL/PLATELET
BASO%: 1.3 % (ref 0.0–2.0)
Basophils Absolute: 0 10*3/uL (ref 0.0–0.1)
EOS%: 3.3 % (ref 0.0–7.0)
Eosinophils Absolute: 0.1 10*3/uL (ref 0.0–0.5)
HCT: 29.4 % — ABNORMAL LOW (ref 34.8–46.6)
HGB: 10.1 g/dL — ABNORMAL LOW (ref 11.6–15.9)
LYMPH%: 30.7 % (ref 14.0–49.7)
MCH: 31.1 pg (ref 25.1–34.0)
MCHC: 34.4 g/dL (ref 31.5–36.0)
MCV: 90.5 fL (ref 79.5–101.0)
MONO#: 0 10*3/uL — ABNORMAL LOW (ref 0.1–0.9)
MONO%: 1.3 % (ref 0.0–14.0)
NEUT#: 1 10*3/uL — ABNORMAL LOW (ref 1.5–6.5)
NEUT%: 63.4 % (ref 38.4–76.8)
Platelets: 298 10*3/uL (ref 145–400)
RBC: 3.25 10*6/uL — ABNORMAL LOW (ref 3.70–5.45)
RDW: 15.4 % — ABNORMAL HIGH (ref 11.2–14.5)
WBC: 1.5 10*3/uL — ABNORMAL LOW (ref 3.9–10.3)
lymph#: 0.5 10*3/uL — ABNORMAL LOW (ref 0.9–3.3)

## 2012-10-16 MED ORDER — FILGRASTIM 480 MCG/0.8ML IJ SOLN
480.0000 ug | Freq: Once | INTRAMUSCULAR | Status: AC
  Administered 2012-10-16: 480 ug via SUBCUTANEOUS
  Filled 2012-10-16: qty 0.8

## 2012-10-16 NOTE — Patient Instructions (Addendum)
Follow up with Dr. Darrold Span as scheduled on 10/23/12 Call our office if you develop fever as instructed Complete your course of Augmentin as prescribed and take a probiotic while you are taking this antibiotic

## 2012-10-17 ENCOUNTER — Ambulatory Visit (HOSPITAL_BASED_OUTPATIENT_CLINIC_OR_DEPARTMENT_OTHER): Payer: 59

## 2012-10-17 VITALS — BP 118/57 | HR 113 | Temp 98.0°F

## 2012-10-17 DIAGNOSIS — C541 Malignant neoplasm of endometrium: Secondary | ICD-10-CM

## 2012-10-17 DIAGNOSIS — D709 Neutropenia, unspecified: Secondary | ICD-10-CM

## 2012-10-17 MED ORDER — FILGRASTIM 480 MCG/0.8ML IJ SOLN
480.0000 ug | Freq: Once | INTRAMUSCULAR | Status: AC
  Administered 2012-10-17: 480 ug via SUBCUTANEOUS
  Filled 2012-10-17: qty 0.8

## 2012-10-18 ENCOUNTER — Encounter: Payer: Self-pay | Admitting: *Deleted

## 2012-10-18 ENCOUNTER — Ambulatory Visit (HOSPITAL_BASED_OUTPATIENT_CLINIC_OR_DEPARTMENT_OTHER): Payer: 59

## 2012-10-18 VITALS — BP 115/58 | HR 116 | Temp 98.4°F

## 2012-10-18 DIAGNOSIS — C541 Malignant neoplasm of endometrium: Secondary | ICD-10-CM

## 2012-10-18 DIAGNOSIS — D709 Neutropenia, unspecified: Secondary | ICD-10-CM

## 2012-10-18 MED ORDER — FILGRASTIM 480 MCG/0.8ML IJ SOLN
480.0000 ug | Freq: Once | INTRAMUSCULAR | Status: AC
  Administered 2012-10-18: 480 ug via SUBCUTANEOUS
  Filled 2012-10-18: qty 0.8

## 2012-10-18 NOTE — Progress Notes (Addendum)
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  Patient presented 03/2012 months ago with symptoms of: intermittent vaginal bleeding  Biopsies of endometrium (if applicable) revealed: high grade endometrial cancer w/serous differentiation  Past/Anticipated interventions by Gyn/Onc surgery, if any: hysterectomy, bso, 07/30/12  Past/Anticipated interventions by medical oncology, if any: adjuvant chemo- taxol/carbo,  q 3 weeks x 6 cycles, begun 08/30/12  Weight changes, if any: lost 10 lbs past 3 months  Bowel/Bladder complaints, if any: Senokot s, Miralax prn  Nausea/Vomiting, if any: only associated w/chemo  Pain issues, if any:  none  SAFETY ISSUES:  Prior radiation? no  Pacemaker/ICD? no  Possible current pregnancy? no  Is the patient on methotrexate? 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

## 2012-10-18 NOTE — Progress Notes (Signed)
OFFICE PROGRESS NOTE   10/16/2012  Physicians:Elizabeth Skinner, MD, Antony Blackbird MD, Sigmund Hazel MD, Gerald Leitz MD, [facility]   INTERVAL HISTORY Patient is seen, alone for visit, in continuing attention to chemotherapy underway for IA high grade endometrial cancer, cycle 2 full dose taxol/ carboplatin given 09-20-12. She had neupogen 4-25, 4-26, 4-28 (due to neutropenia, dates due to timing of taxol aches cycle 1). This second cycle was complicated by a syncopal episode day 3 (at church on Easter), weakness and bowel problems, but she did not have the severe taxol aches that she experienced with cycle 1 (with no change in taxol dose) and tolerated the neupogen without difficulty. She does not have central catheter. Cycle 3 given 10/11/2012.    Oncology History Patient presented to PCP Sigmund Hazel with intermittent vaginal bleeding since ~ Oct 2013. Dr Hyacinth Meeker did endometrial biopsy 06-20-12 which showed complex endometrial hyperplasia with atypia. She had pelvic and transvaginal US in [facility] system on 06-26-12, which showed endometrial thickening of 2.7 cm, right ovary not clearly seen and left ovary with normal postmenopausal appearance. She was seen by Dr Gerald Leitz and referred to Dr Clifton James. She had robotic hysterectomy with BSO and bilateral pelvic and periaortic node dissection by Dr Clifton James at [facility] on 07-30-2012. Intraoperatively she had large endometrial polyp with high grade malignancy, no obvious extrauterine disease. Pathology ([facility] [place] [id number] from 07-30-2012) had high grade, poorly differentiated endometrial adenocarcinoma with solid and serous papillary components, 2 periaortic nodes negative and 9 right pelvic/11 left pelvic nodes negative. The tumor was 4.8 cm confined to polyp with no definite myometrial involvement, margins clear and no angiolymphatic invasion identified.  Cycle 1 taxol carboplatin was given 08-30-12, 6 cycles  planned.  Review of Systems Presents for evaluation of significant congestion, productive cough as well as a left sided toothache. She believes the tooth in question is actually in the upper, and but she has pain radiation radiating to the left lower gum line. She complains of feeling very tired and worn out. She notes decreased appetite. Her fluid intake he consists of primarily water. She denied fever. She did start the Augmentin that was prescribed by Dr. Darrold Span last night as directed. She also reports some mild nausea. She complains of her knees is taking, this is likely aches related to the Taxol.  No bleeding. Remainder of 10 point Review of Systems negative.  Objective:  Vital signs in last 24 hours: Weight 194 lb 6 oz, 135/83, HR 118 regular, resp 16, temp 97.7. Easily ambulatory, alert and easily conversant, NAD but sounds very congested.  HEENT:PERRLA, extra ocular movement intact and sclera clear, anicteric. Alopecia, oropharynx erythematous without exudate LymphaticsCervical, supraclavicular, and axillary nodes normal. No inguinal adenopathy Resp: clear to auscultation bilaterally and normal percussion bilaterally Cardio: regular rate and rhythm GI: soft, not tender, not distended, quiet, no HSM or mass Extremities: extremities normal, atraumatic, no cyanosis or edema Neuro:no sensory deficits noted Breast:normal without suspicious masses, skin or nipple changes or axillary nodes No central catheter Skin without rash or ecchymosis  Lab Results:  CBC today with WBC 1.5, ANC 1.0, Hgb 10.1 and plt 298k.      Studies/Results:  No results found.  Medications: I have reviewed the patient's current medications. She will continue with her course of Augmentin as prescribed. She was advised to take a probiotic while she is on this antibiotic. We will start her 3 days of Neupogen 300 mcg subcutaneously beginning today and  ending on 10/18/2012.    Assessment/Plan:  1.IA high  grade endometrial carcinoma involving endometrial polyp, post optimal surgery by Dr Clifton James 07-30-12, adjuvant taxol carboplatin q 3 weeks x 6 cycles begun 08-30-12.  Medication instructions as above. Status post cycle 3 chemo given 10-11-12, as above Neupogen will be given 514, 5:15 and 516. Patient will followup with Dr. Darrold Span as previously scheduled on 10/23/2012 with repeat labs at that visit as well.  Proceed with HDR planned by Dr Roselind Messier. 2.BP better today at 135/83. Continue to monitor and adjust medications as needed 3.anemia: appears primarily from chemo. Will begin oral iron if tolerates and follow. 4.overdue mammograms, which she would like to have after chemo completes  5. Family hx melanoma 6. Bronchitis/Tooth pain- patient is to complete her course of Augmentin as prescribed. She is also to take a probiotic while taking the Augmentin. She will schedule an appointment with her dentist for evaluation of her tooth pain once her counts have recovered. Patient was comfortable with discussion and plan.  Laural Benes, Sylvia Kondracki E, PA-C   10/18/2012, 9:30 AM

## 2012-10-19 ENCOUNTER — Ambulatory Visit: Payer: 59

## 2012-10-21 ENCOUNTER — Ambulatory Visit: Payer: 59

## 2012-10-21 ENCOUNTER — Ambulatory Visit: Admission: RE | Admit: 2012-10-21 | Payer: 59 | Source: Ambulatory Visit | Admitting: Radiation Oncology

## 2012-10-21 ENCOUNTER — Telehealth: Payer: Self-pay | Admitting: *Deleted

## 2012-10-21 NOTE — Telephone Encounter (Signed)
CALLED PATIENT TO TELL HER THAT SHE DOESN'T NEED TO COME TODAY, PER DR. KINARD, SPOKE WITH HER AND SHE IS AWARE THAT SHE DOESN'T NEED TO COME TODAY

## 2012-10-21 NOTE — Telephone Encounter (Signed)
CALLED PATIENT TO INFORM OF HDR VAG. CUFF CASE AND SHE IS AWARE OF THESE APPTS.

## 2012-10-23 ENCOUNTER — Encounter: Payer: Self-pay | Admitting: Oncology

## 2012-10-23 ENCOUNTER — Other Ambulatory Visit (HOSPITAL_BASED_OUTPATIENT_CLINIC_OR_DEPARTMENT_OTHER): Payer: 59 | Admitting: Lab

## 2012-10-23 ENCOUNTER — Ambulatory Visit (HOSPITAL_BASED_OUTPATIENT_CLINIC_OR_DEPARTMENT_OTHER): Payer: 59 | Admitting: Oncology

## 2012-10-23 ENCOUNTER — Other Ambulatory Visit: Payer: Self-pay

## 2012-10-23 ENCOUNTER — Telehealth: Payer: Self-pay | Admitting: Oncology

## 2012-10-23 VITALS — BP 141/76 | HR 95 | Temp 98.0°F | Resp 19 | Ht 63.0 in | Wt 199.0 lb

## 2012-10-23 DIAGNOSIS — C549 Malignant neoplasm of corpus uteri, unspecified: Secondary | ICD-10-CM

## 2012-10-23 DIAGNOSIS — D6481 Anemia due to antineoplastic chemotherapy: Secondary | ICD-10-CM

## 2012-10-23 DIAGNOSIS — C541 Malignant neoplasm of endometrium: Secondary | ICD-10-CM

## 2012-10-23 DIAGNOSIS — T451X5A Adverse effect of antineoplastic and immunosuppressive drugs, initial encounter: Secondary | ICD-10-CM

## 2012-10-23 LAB — CBC WITH DIFFERENTIAL/PLATELET
BASO%: 1.1 % (ref 0.0–2.0)
Basophils Absolute: 0 10*3/uL (ref 0.0–0.1)
EOS%: 1.2 % (ref 0.0–7.0)
Eosinophils Absolute: 0 10*3/uL (ref 0.0–0.5)
HCT: 25.7 % — ABNORMAL LOW (ref 34.8–46.6)
HGB: 9 g/dL — ABNORMAL LOW (ref 11.6–15.9)
LYMPH%: 32.1 % (ref 14.0–49.7)
MCH: 32.7 pg (ref 25.1–34.0)
MCHC: 35.1 g/dL (ref 31.5–36.0)
MCV: 93 fL (ref 79.5–101.0)
MONO#: 0.4 10*3/uL (ref 0.1–0.9)
MONO%: 11.8 % (ref 0.0–14.0)
NEUT#: 2 10*3/uL (ref 1.5–6.5)
NEUT%: 53.8 % (ref 38.4–76.8)
Platelets: 192 10*3/uL (ref 145–400)
RBC: 2.76 10*6/uL — ABNORMAL LOW (ref 3.70–5.45)
RDW: 17.5 % — ABNORMAL HIGH (ref 11.2–14.5)
WBC: 3.8 10*3/uL — ABNORMAL LOW (ref 3.9–10.3)
lymph#: 1.2 10*3/uL (ref 0.9–3.3)

## 2012-10-23 LAB — COMPREHENSIVE METABOLIC PANEL (CC13)
ALT: 22 U/L (ref 0–55)
AST: 18 U/L (ref 5–34)
Albumin: 3.4 g/dL — ABNORMAL LOW (ref 3.5–5.0)
Alkaline Phosphatase: 73 U/L (ref 40–150)
BUN: 17.9 mg/dL (ref 7.0–26.0)
CO2: 24 mEq/L (ref 22–29)
Calcium: 9.4 mg/dL (ref 8.4–10.4)
Chloride: 104 mEq/L (ref 98–107)
Creatinine: 0.8 mg/dL (ref 0.6–1.1)
Glucose: 116 mg/dl — ABNORMAL HIGH (ref 70–99)
Potassium: 3.5 mEq/L (ref 3.5–5.1)
Sodium: 140 mEq/L (ref 136–145)
Total Bilirubin: 0.26 mg/dL (ref 0.20–1.20)
Total Protein: 7 g/dL (ref 6.4–8.3)

## 2012-10-23 MED ORDER — DEXAMETHASONE 4 MG PO TABS: ORAL_TABLET | ORAL | Status: DC

## 2012-10-23 NOTE — Telephone Encounter (Signed)
lvm for pt regarding to lab on 5.27.14

## 2012-10-23 NOTE — Patient Instructions (Signed)
We will recheck CBC on 5-27 when you come for RT, to be sure counts are ok for chemo on 5-30   Please set up appointment to let your dentist look at the tooth

## 2012-10-23 NOTE — Progress Notes (Signed)
OFFICE PROGRESS NOTE   10/23/2012   Physicians:Elizabeth Skinner, MD, Antony Blackbird MD, Sigmund Hazel MD, Gerald Leitz MD, [facility]   INTERVAL HISTORY:   Patient is seen, alone for visit, in continuing attention to adjuvant chemotherapy in process for [place] high grade endometrial cancer, cycle 3 of q 3 week taxol/ [place] given on 10-11-2012 with neupogen x 3 days beginning 10-16-12. She had upper and lower respiratory infection and uncomfortable left tooth last week, has completed course of Augmentin, and all symptoms are improved. She saw Dr Clifton James on 10-03-12 and will begin HDR by Dr Roselind Messier on 10-29-12. She does not have central catheter.  Oncology History  Patient presented to PCP Sigmund Hazel with intermittent vaginal bleeding since ~ Oct 2013. Dr Hyacinth Meeker did endometrial biopsy 06-20-12 which showed complex endometrial hyperplasia with atypia. She had pelvic and transvaginal US in  system on 06-26-12, which showed endometrial thickening of 2.7 cm, right ovary not clearly seen and left ovary with normal postmenopausal appearance. She was seen by Dr Gerald Leitz and referred to Dr Clifton James. She had robotic hysterectomy with BSO and bilateral pelvic and periaortic node dissection by Dr Clifton James at [facility] on 07-30-2012. Intraoperatively she had large endometrial polyp with high grade malignancy, no obvious extrauterine disease. Pathology ([facility] [place] [id number] from 07-30-2012) had high grade, poorly differentiated endometrial adenocarcinoma with solid and serous papillary components, 2 periaortic nodes negative and 9 right pelvic/11 left pelvic nodes negative. The tumor was 4.8 cm confined to polyp with no definite myometrial involvement, margins clear and no angiolymphatic invasion identified.  Cycle 1 taxol carboplatin was given 08-30-12, 6 cycles planned.    Review of Systems Still slight cough, no longer productive, afebrile. No dental discomfort. Bowels  loose x 1 day at completion of Augmentin. Appetite improved. No peripheral neuropathy. Able to sleep last pm. Has been back at work, still some fatigue. Remainder of 10 point Review of Systems negative.  Objective:  Vital signs in last 24 hours:  BP 141/76  Pulse 95  Temp(Src) 98 F (36.7 C) (Oral)  Resp 19  Ht 5\' 3"  (1.6 m)  Wt 199 lb (90.266 kg)  BMI 35.26 kg/m2 Weight is up 4 lbs. Alopecia. Voice a little raspy, otherwise looks much better than on 10-18-12. Easily ambulatory and very pleasant as always.   HEENT:PERRLA and sclera clear, anicteric Oral mucosa moist and clear.  LymphaticsCervical, supraclavicular, and axillary nodes normal.No inguinal adenopathy Resp: clear to auscultation bilaterally and normal percussion bilaterally Cardio: regular rate and rhythm GI: soft, non-tender; bowel sounds normal; no masses,  no organomegaly, scars from laparoscopic surgery well healed. Extremities: extremities normal, atraumatic, no cyanosis or edema Neuro:no sensory deficits noted Skin without rash or ecchymosis  Lab Results:  Results for orders placed in visit on 10/23/12  CBC WITH DIFFERENTIAL      Result Value Range   WBC 3.8 (*) 3.9 - 10.3 10e3/uL   NEUT# 2.0  1.5 - 6.5 10e3/uL   HGB 9.0 (*) 11.6 - 15.9 g/dL   HCT 13.0 (*) 86.5 - 78.4 %   Platelets 192  145 - 400 10e3/uL   MCV 93.0  79.5 - 101.0 fL   MCH 32.7  25.1 - 34.0 pg   MCHC 35.1  31.5 - 36.0 g/dL   RBC 6.96 (*) 2.95 - 2.84 10e6/uL   RDW 17.5 (*) 11.2 - 14.5 %   lymph# 1.2  0.9 - 3.3 10e3/uL   MONO# 0.4  0.1 -  0.9 10e3/uL   Eosinophils Absolute 0.0  0.0 - 0.5 10e3/uL   Basophils Absolute 0.0  0.0 - 0.1 10e3/uL   NEUT% 53.8  38.4 - 76.8 %   LYMPH% 32.1  14.0 - 49.7 %   MONO% 11.8  0.0 - 14.0 %   EOS% 1.2  0.0 - 7.0 %   BASO% 1.1  0.0 - 2.0 %    CMET available after visit normal with exception of nonfasting glucose of 116 and alb 3.4  Studies/Results:  No results found.  Medications: I have reviewed the  patient's current medications.  Assessment/Plan: 1.IA high grade endometrial carcinoma involving endometrial polyp, post optimal surgery by Dr Clifton James 07-30-12, adjuvant taxol carboplatin q 3 weeks x 6 cycles begun 08-30-12, cycle 4 due 11-01-12 as long as ANC >=1.5 and plt >=100k by labs either 5-27 (coordinate with RT) or day of chemo. She will have neupogen x 3 days (begin ~ 6-6 due to taxol aches). HDR to begin 10-29-12. 2.Hx HTN but BP lower after chemo previously, so that she will hold BP meds if appropriate. She is aware of pushing po fluids after chemo, has preferred not to have IVF tho will let us know if unable to drink enough after treatment. 3.anemia: primarily related to chemo, on oral iron   4.overdue mammograms which she wants to do after chemo completes 5.respiratory infection essentially resolved. Tooth pain improved, but I have asked her to see dentist.  Patient is in agreement with plan.  Calel Pisarski P, MD   10/23/2012, 9:08 AM

## 2012-10-24 ENCOUNTER — Telehealth: Payer: Self-pay | Admitting: *Deleted

## 2012-10-24 ENCOUNTER — Telehealth: Payer: Self-pay | Admitting: Oncology

## 2012-10-24 NOTE — Telephone Encounter (Signed)
Per staff message and POF I have scheduled appts.  JMW  

## 2012-10-25 ENCOUNTER — Telehealth: Payer: Self-pay | Admitting: *Deleted

## 2012-10-25 NOTE — Telephone Encounter (Signed)
CALLED PATIENT TO REMIND OF APPTS. FOR 10-29-12, LVM FOR A RETURN CALL

## 2012-10-29 ENCOUNTER — Encounter: Payer: Self-pay | Admitting: Radiation Oncology

## 2012-10-29 ENCOUNTER — Ambulatory Visit
Admission: RE | Admit: 2012-10-29 | Discharge: 2012-10-29 | Disposition: A | Discharge status: Home / Self Care | Payer: 59 | Source: Ambulatory Visit | Attending: Radiation Oncology | Admitting: Radiation Oncology

## 2012-10-29 ENCOUNTER — Other Ambulatory Visit (HOSPITAL_BASED_OUTPATIENT_CLINIC_OR_DEPARTMENT_OTHER): Payer: 59 | Admitting: Lab

## 2012-10-29 VITALS — BP 128/67 | HR 92 | Temp 97.9°F | Resp 20 | Wt 199.1 lb

## 2012-10-29 DIAGNOSIS — C549 Malignant neoplasm of corpus uteri, unspecified: Secondary | ICD-10-CM

## 2012-10-29 DIAGNOSIS — C541 Malignant neoplasm of endometrium: Secondary | ICD-10-CM

## 2012-10-29 DIAGNOSIS — Z510 Encounter for antineoplastic radiation therapy: Secondary | ICD-10-CM | POA: Insufficient documentation

## 2012-10-29 LAB — CBC WITH DIFFERENTIAL/PLATELET
BASO%: 1 % (ref 0.0–2.0)
Basophils Absolute: 0 10*3/uL (ref 0.0–0.1)
EOS%: 2.3 % (ref 0.0–7.0)
Eosinophils Absolute: 0.1 10*3/uL (ref 0.0–0.5)
HCT: 26.8 % — ABNORMAL LOW (ref 34.8–46.6)
HGB: 8.8 g/dL — ABNORMAL LOW (ref 11.6–15.9)
LYMPH%: 33.7 % (ref 14.0–49.7)
MCH: 31.4 pg (ref 25.1–34.0)
MCHC: 32.8 g/dL (ref 31.5–36.0)
MCV: 95.7 fL (ref 79.5–101.0)
MONO#: 0.3 10*3/uL (ref 0.1–0.9)
MONO%: 8.5 % (ref 0.0–14.0)
NEUT#: 2.1 10*3/uL (ref 1.5–6.5)
NEUT%: 54.5 % (ref 38.4–76.8)
Platelets: 110 10*3/uL — ABNORMAL LOW (ref 145–400)
RBC: 2.8 10*6/uL — ABNORMAL LOW (ref 3.70–5.45)
RDW: 17.8 % — ABNORMAL HIGH (ref 11.2–14.5)
WBC: 3.9 10*3/uL (ref 3.9–10.3)
lymph#: 1.3 10*3/uL (ref 0.9–3.3)

## 2012-10-29 NOTE — Progress Notes (Signed)
   Department of Radiation Oncology  Phone:  [phone] Fax:        [phone]  Vaginal brachytherapy procedure note  This was taken to the nurse's station and placed in the dorsolithotomy position. A pelvic exam was performed. There are no masses palpable. The vaginal cuff is well-healed without signs of breakdown or tumor.  The patient proceeded to undergo serial dilation of the vaginal introitus. She was fitted for her vaginal cylinder. The optimal diameter was a 3 cm diameter cylinder. The patient tolerated the procedure well with minimal discomfort.  Simple treatment device note  Patient had construction of a custom vaginal cylinder for her HDR treatments. She will be treated with three 3.0 cm rings placed proximally within the vagina. More distal will be to 2.5 cm rings.  -----------------------------------  Billie Lade, PhD, MD

## 2012-10-29 NOTE — Progress Notes (Signed)
Pt denies pain, bladder or bowel issues, vaginal discharge, nausea, loss of appetite. She does report some fatigue. Pt here for vaginal HDR ct sim today; reviewed today's process w/pt, no questions verbalized.

## 2012-10-29 NOTE — Progress Notes (Signed)
   Department of Radiation Oncology  Phone:  [phone] Fax:        [phone]  Simulation and treatment planning note  The patient was transferred to the CT simulation suite area she was placed in the also lithotomy position. The previously constructed vaginal cylinder was placed in the proximal vagina. This was affixed to the CT/MR stabilization plate to prevent slippage. The patient proceeded to undergo CT scan through the pelvis area. Initial images showed that the bladder was significantly distended. The cylinder was removed and the patient emptied her bladder. The cylinder was reinserted and images were obtained through the pelvis area. The cylinder was in good position without any significant air gaps noted within the vaginal vault.  Treatment plan   patient will proceed with planning for her high-dose-rate radiation treatments. She will be treated with iridium 192 as the high-dose-rate source. She will receive 5 weekly treatments with the prescription dose of 6 Gy to the proximal mucosal surface. Treatment length will be 4 cm. Cumulative radiation dose will be 30 Gy.  -----------------------------------  Billie Lade, PhD, MD

## 2012-10-29 NOTE — Progress Notes (Signed)
Please see the Nurse Progress Note in the MD Initial Consult Encounter for this patient. 

## 2012-10-29 NOTE — Progress Notes (Signed)
   Department of Radiation Oncology  Phone:  [phone] Fax:        [phone]  High-dose-rate brachytherapy procedure note  After planning was complete the patient was transferred to the high dose rate suite. The vaginal cylinder was reinserted. This was affixed to the CT/MR stabilization plate to prevent slippage.  Verification simulation   A fiducial marker was placed within the vaginal cylinder. An AP and lateral film was obtained in the treatment position. This was compared to the planning films earlier in the day documenting good position of the vaginal cylinder for treatment.  High-dose-rate brachytherapy treatment  The cylinder was attached to the the Nucletron safe by catheter system. The patient then proceeded to undergo her first treatment. She was prescribed a dose of 6 Gy to the proximal mucosal surface. This was achieved with 1 channel using 9 dwell positions. Treatment time was 373.2 seconds. The patient tolerated the procedure well. After completion of her therapy a radiation survey was performed documenting return of the iridium source into the Nucletron safe.  -----------------------------------  Billie Lade, PhD, MD

## 2012-10-30 ENCOUNTER — Ambulatory Visit: Payer: 59 | Admitting: Radiation Oncology

## 2012-10-30 ENCOUNTER — Other Ambulatory Visit: Payer: Self-pay | Admitting: Radiation Oncology

## 2012-10-30 ENCOUNTER — Other Ambulatory Visit: Payer: Self-pay | Admitting: Oncology

## 2012-10-30 ENCOUNTER — Ambulatory Visit: Payer: 59

## 2012-10-30 ENCOUNTER — Encounter: Payer: Self-pay | Admitting: Radiation Oncology

## 2012-10-30 DIAGNOSIS — C541 Malignant neoplasm of endometrium: Secondary | ICD-10-CM

## 2012-10-31 ENCOUNTER — Telehealth: Payer: Self-pay

## 2012-10-31 NOTE — Telephone Encounter (Signed)
LM for Diane Lozano that her counts are fine for her treatment tomorrow. Her platelets were a little lower at 110 k so Dr. Darrold Span is going to decrease the carboplatin dose a little. Her Hgb was a little lower at 8.8.  Dr. Darrold Span wanted to make sure that she was not symptomatic.  She can call back to the offfice before 1700 today or let the infusion nurse know how she is feeling tomorrow at treatment if she gets message after 1700 today.

## 2012-11-01 ENCOUNTER — Other Ambulatory Visit (HOSPITAL_BASED_OUTPATIENT_CLINIC_OR_DEPARTMENT_OTHER): Payer: 59 | Admitting: Lab

## 2012-11-01 ENCOUNTER — Ambulatory Visit (HOSPITAL_BASED_OUTPATIENT_CLINIC_OR_DEPARTMENT_OTHER): Payer: 59

## 2012-11-01 ENCOUNTER — Ambulatory Visit: Payer: 59 | Admitting: Nutrition

## 2012-11-01 ENCOUNTER — Other Ambulatory Visit: Payer: Self-pay | Admitting: Oncology

## 2012-11-01 VITALS — BP 113/77 | HR 111 | Temp 98.3°F | Resp 20

## 2012-11-01 DIAGNOSIS — C549 Malignant neoplasm of corpus uteri, unspecified: Secondary | ICD-10-CM

## 2012-11-01 DIAGNOSIS — Z5111 Encounter for antineoplastic chemotherapy: Secondary | ICD-10-CM

## 2012-11-01 DIAGNOSIS — C541 Malignant neoplasm of endometrium: Secondary | ICD-10-CM

## 2012-11-01 LAB — CBC WITH DIFFERENTIAL/PLATELET
BASO%: 0.2 % (ref 0.0–2.0)
Basophils Absolute: 0 10*3/uL (ref 0.0–0.1)
EOS%: 0 % (ref 0.0–7.0)
Eosinophils Absolute: 0 10*3/uL (ref 0.0–0.5)
HCT: 29.4 % — ABNORMAL LOW (ref 34.8–46.6)
HGB: 9.9 g/dL — ABNORMAL LOW (ref 11.6–15.9)
LYMPH%: 9.5 % — ABNORMAL LOW (ref 14.0–49.7)
MCH: 31.9 pg (ref 25.1–34.0)
MCHC: 33.7 g/dL (ref 31.5–36.0)
MCV: 94.8 fL (ref 79.5–101.0)
MONO#: 0 10*3/uL — ABNORMAL LOW (ref 0.1–0.9)
MONO%: 0.4 % (ref 0.0–14.0)
NEUT#: 4.1 10*3/uL (ref 1.5–6.5)
NEUT%: 89.9 % — ABNORMAL HIGH (ref 38.4–76.8)
Platelets: 169 10*3/uL (ref 145–400)
RBC: 3.1 10*6/uL — ABNORMAL LOW (ref 3.70–5.45)
RDW: 18.2 % — ABNORMAL HIGH (ref 11.2–14.5)
WBC: 4.6 10*3/uL (ref 3.9–10.3)
lymph#: 0.4 10*3/uL — ABNORMAL LOW (ref 0.9–3.3)

## 2012-11-01 MED ORDER — PACLITAXEL CHEMO INJECTION 300 MG/50ML
175.0000 mg/m2 | Freq: Once | INTRAVENOUS | Status: AC
  Administered 2012-11-01: 348 mg via INTRAVENOUS
  Filled 2012-11-01: qty 58

## 2012-11-01 MED ORDER — SODIUM CHLORIDE 0.9 % IV SOLN
544.0000 mg | Freq: Once | INTRAVENOUS | Status: AC
  Administered 2012-11-01: 540 mg via INTRAVENOUS
  Filled 2012-11-01: qty 54

## 2012-11-01 MED ORDER — ONDANSETRON 16 MG/50ML IVPB (CHCC)
16.0000 mg | Freq: Once | INTRAVENOUS | Status: AC
  Administered 2012-11-01: 16 mg via INTRAVENOUS

## 2012-11-01 MED ORDER — DIPHENHYDRAMINE HCL 50 MG/ML IJ SOLN
50.0000 mg | Freq: Once | INTRAMUSCULAR | Status: AC
  Administered 2012-11-01: 50 mg via INTRAVENOUS

## 2012-11-01 MED ORDER — SODIUM CHLORIDE 0.9 % IV SOLN
Freq: Once | INTRAVENOUS | Status: AC
  Administered 2012-11-01: 10:00:00 via INTRAVENOUS

## 2012-11-01 MED ORDER — HEPARIN SOD (PORK) LOCK FLUSH 100 UNIT/ML IV SOLN
500.0000 [IU] | Freq: Once | INTRAVENOUS | Status: DC | PRN
  Filled 2012-11-01: qty 5

## 2012-11-01 MED ORDER — SODIUM CHLORIDE 0.9 % IJ SOLN
10.0000 mL | INTRAMUSCULAR | Status: DC | PRN
  Filled 2012-11-01: qty 10

## 2012-11-01 MED ORDER — DEXAMETHASONE SODIUM PHOSPHATE 20 MG/5ML IJ SOLN
20.0000 mg | Freq: Once | INTRAMUSCULAR | Status: AC
  Administered 2012-11-01: 20 mg via INTRAVENOUS

## 2012-11-01 MED ORDER — FAMOTIDINE IN NACL 20-0.9 MG/50ML-% IV SOLN
20.0000 mg | Freq: Once | INTRAVENOUS | Status: AC
  Administered 2012-11-01: 20 mg via INTRAVENOUS

## 2012-11-01 NOTE — Progress Notes (Signed)
Patient reports she feels well. Her weight is increased to 199.1 pounds on May 27 from 195.8 pounds April 29. Patient struggles with increasing fluids around time of chemotherapy. She has no other nutrition impact symptoms.  Nutrition diagnosis: Food and nutrition related knowledge deficit has improved.  Intervention: I have educated patient on a variety of fluids she may tolerate and enjoy more. I've again stressed the importance of adequate nutrition to promote weight maintenance. I've answered her questions. Teach back method used.  Monitoring, evaluation, goals: Patient will tolerate increased fluids as well as adequate calories and protein to promote weight maintenance.  Next visit: Friday, June 20, during chemotherapy.

## 2012-11-01 NOTE — Patient Instructions (Signed)
Patient aware of next appointment; discharged home with no complaints. 

## 2012-11-06 ENCOUNTER — Telehealth: Payer: Self-pay | Admitting: *Deleted

## 2012-11-06 ENCOUNTER — Ambulatory Visit: Payer: 59 | Admitting: Radiation Oncology

## 2012-11-06 NOTE — Telephone Encounter (Signed)
CALLED PATIENT TO REMIND OF HDR TX. FOR 11-07-12, LVM FOR A RETURN CALL

## 2012-11-06 NOTE — Telephone Encounter (Signed)
XXXX 

## 2012-11-07 ENCOUNTER — Ambulatory Visit
Admission: RE | Admit: 2012-11-07 | Discharge: 2012-11-07 | Disposition: A | Discharge status: Home / Self Care | Payer: 59 | Source: Ambulatory Visit | Attending: Radiation Oncology | Admitting: Radiation Oncology

## 2012-11-07 DIAGNOSIS — C541 Malignant neoplasm of endometrium: Secondary | ICD-10-CM

## 2012-11-07 NOTE — Progress Notes (Signed)
   Department of Radiation Oncology  Phone:  [phone] Fax:        [phone]  High-dose-rate brachytherapy procedure note   Vaginal brachytherapy procedure note   The patient was placed in the dorsolithotomy position. A pelvic exam was performed. There are no masses palpable. The vaginal cuff is well-healed without signs of breakdown or tumor. The patient proceeded to undergo serial dilation of the vaginal introitus. The vaginal cylinder was placed in the proximal vagina. This was affixed to the CT/MR stabilization plate to prevent slippage. The patient tolerated the procedure well with minimal discomfort.   Simple treatment device note   Patient had construction of her custom vaginal cylinder for her HDR treatments. She will be treated with three 3.0 cm rings placed proximally within the vagina. More distal will be to 2.5 cm rings.   Verification simulation   A fiducial marker was placed within the vaginal cylinder. An AP and lateral film was obtained in the treatment position. This was compared to the planning films earlier in the day documenting good position of the vaginal cylinder for treatment.   High-dose-rate brachytherapy treatment   The cylinder was attached to the the Nucletron safe by catheter system. The patient then proceeded to undergo her second treatment. She was prescribed a dose of 6 Gy to the proximal mucosal surface. This was achieved with 1 channel using 9 dwell positions. Treatment time was 405.4 seconds. The patient tolerated the procedure well. After completion of her therapy a radiation survey was performed documenting return of the iridium source into the Nucletron safe.   -----------------------------------  Billie Lade, PhD, MD

## 2012-11-08 ENCOUNTER — Ambulatory Visit (HOSPITAL_BASED_OUTPATIENT_CLINIC_OR_DEPARTMENT_OTHER): Payer: 59

## 2012-11-08 VITALS — BP 129/65 | HR 105 | Temp 98.3°F

## 2012-11-08 DIAGNOSIS — D709 Neutropenia, unspecified: Secondary | ICD-10-CM

## 2012-11-08 DIAGNOSIS — C541 Malignant neoplasm of endometrium: Secondary | ICD-10-CM

## 2012-11-08 MED ORDER — FILGRASTIM 480 MCG/0.8ML IJ SOLN
480.0000 ug | Freq: Once | INTRAMUSCULAR | Status: AC
  Administered 2012-11-08: 480 ug via SUBCUTANEOUS
  Filled 2012-11-08: qty 0.8

## 2012-11-09 ENCOUNTER — Ambulatory Visit (HOSPITAL_BASED_OUTPATIENT_CLINIC_OR_DEPARTMENT_OTHER): Payer: 59

## 2012-11-09 VITALS — BP 136/58 | HR 101 | Temp 97.8°F

## 2012-11-09 DIAGNOSIS — D709 Neutropenia, unspecified: Secondary | ICD-10-CM

## 2012-11-09 MED ORDER — FILGRASTIM 480 MCG/0.8ML IJ SOLN
480.0000 ug | Freq: Once | INTRAMUSCULAR | Status: AC
  Administered 2012-11-09: 480 ug via SUBCUTANEOUS

## 2012-11-09 NOTE — Patient Instructions (Addendum)
Filgrastim, G-CSF injection What is this medicine? FILGRASTIM, G-CSF (fil GRA stim) stimulates the formation of white blood cells. This medicine is given to patients with conditions that may cause a decrease in white blood cells, like those receiving certain types of chemotherapy or bone marrow transplant. It helps the bone marrow recover its ability to produce white blood cells. Increasing the amount of white blood cells helps to decrease the risk of infection and fever. This medicine may be used for other purposes; ask your health care provider or pharmacist if you have questions. What should I tell my health care provider before I take this medicine? They need to know if you have any of these conditions: -currently receiving radiation therapy -sickle cell disease -an unusual or allergic reaction to filgrastim, E. coli protein, other medicines, foods, dyes, or preservatives -pregnant or trying to get pregnant -breast-feeding How should I use this medicine? This medicine is for injection into a vein or injection under the skin. It is usually given by a health care professional in a hospital or clinic setting. If you get this medicine at home, you will be taught how to prepare and give this medicine. Always change the site for the injection under the skin. Let the solution warm to room temperature before you use it. Do not shake the solution before you withdraw a dose. Throw away any unused portion. Use exactly as directed. Take your medicine at regular intervals. Do not take your medicine more often than directed. It is important that you put your used needles and syringes in a special sharps container. Do not put them in a trash can. If you do not have a sharps container, call your pharmacist or healthcare provider to get one. Talk to your pediatrician regarding the use of this medicine in children. While this medicine may be prescribed for children for selected conditions, precautions do  apply. Overdosage: If you think you have taken too much of this medicine contact a poison control center or emergency room at once. NOTE: This medicine is only for you. Do not share this medicine with others. What if I miss a dose? Try not to miss doses. If you miss a dose take the dose as soon as you remember. If it is almost time for the next dose, do not take double doses unless told to by your doctor or health care professional. What may interact with this medicine? -lithium -medicines for cancer chemotherapy This list may not describe all possible interactions. Give your health care provider a list of all the medicines, herbs, non-prescription drugs, or dietary supplements you use. Also tell them if you smoke, drink alcohol, or use illegal drugs. Some items may interact with your medicine. What should I watch for while using this medicine? Visit your doctor or health care professional for regular checks on your progress. If you get a fever or any sign of infection while you are using this medicine, do not treat yourself. Check with your doctor or health care professional. Bone pain can usually be relieved by mild pain relievers such as acetaminophen or ibuprofen. Check with your doctor or health care professional before taking these medicines as they may hide a fever. Call your doctor or health care professional if the aches and pains are severe or do not go away. What side effects may I notice from receiving this medicine? Side effects that you should report to your doctor or health care professional as soon as possible: -allergic reactions like skin rash, itching   or hives, swelling of the face, lips, or tongue -difficulty breathing, wheezing -fever -pain, redness, or swelling at the injection site -stomach or side pain, or pain at the shoulder Side effects that usually do not require medical attention (report to your doctor or health care professional if they continue or are  bothersome): -bone pain (ribs, lower back, breast bone) -headache -skin rash This list may not describe all possible side effects. Call your doctor for medical advice about side effects. You may report side effects to FDA at 1-800-FDA-1088. Where should I keep my medicine? Keep out of the reach of children. Store in a refrigerator between 2 and 8 degrees C (36 and 46 degrees F). Do not freeze or leave in direct sunlight. If vials or syringes are left out of the refrigerator for more than 24 hours, they must be thrown away. Throw away unused vials after the expiration date on the carton. NOTE: This sheet is a summary. It may not cover all possible information. If you have questions about this medicine, talk to your doctor, pharmacist, or health care provider.  2013, [employer]. (08/07/2007 1:33:21 PM)  

## 2012-11-11 ENCOUNTER — Telehealth: Payer: Self-pay

## 2012-11-11 ENCOUNTER — Other Ambulatory Visit (HOSPITAL_BASED_OUTPATIENT_CLINIC_OR_DEPARTMENT_OTHER): Payer: 59 | Admitting: Lab

## 2012-11-11 ENCOUNTER — Ambulatory Visit (HOSPITAL_BASED_OUTPATIENT_CLINIC_OR_DEPARTMENT_OTHER): Payer: 59

## 2012-11-11 VITALS — BP 128/74 | HR 103 | Temp 98.5°F

## 2012-11-11 DIAGNOSIS — C541 Malignant neoplasm of endometrium: Secondary | ICD-10-CM

## 2012-11-11 DIAGNOSIS — C549 Malignant neoplasm of corpus uteri, unspecified: Secondary | ICD-10-CM

## 2012-11-11 DIAGNOSIS — D709 Neutropenia, unspecified: Secondary | ICD-10-CM

## 2012-11-11 LAB — CBC WITH DIFFERENTIAL/PLATELET
BASO%: 1.4 % (ref 0.0–2.0)
Basophils Absolute: 0.1 10*3/uL (ref 0.0–0.1)
EOS%: 2 % (ref 0.0–7.0)
Eosinophils Absolute: 0.1 10*3/uL (ref 0.0–0.5)
HCT: 24.4 % — ABNORMAL LOW (ref 34.8–46.6)
HGB: 8.5 g/dL — ABNORMAL LOW (ref 11.6–15.9)
LYMPH%: 28.6 % (ref 14.0–49.7)
MCH: 33.6 pg (ref 25.1–34.0)
MCHC: 34.9 g/dL (ref 31.5–36.0)
MCV: 96.4 fL (ref 79.5–101.0)
MONO#: 0.8 10*3/uL (ref 0.1–0.9)
MONO%: 16.6 % — ABNORMAL HIGH (ref 0.0–14.0)
NEUT#: 2.3 10*3/uL (ref 1.5–6.5)
NEUT%: 51.4 % (ref 38.4–76.8)
Platelets: 203 10*3/uL (ref 145–400)
RBC: 2.53 10*6/uL — ABNORMAL LOW (ref 3.70–5.45)
RDW: 19.8 % — ABNORMAL HIGH (ref 11.2–14.5)
WBC: 4.6 10*3/uL (ref 3.9–10.3)
lymph#: 1.3 10*3/uL (ref 0.9–3.3)

## 2012-11-11 MED ORDER — FILGRASTIM 480 MCG/0.8ML IJ SOLN
480.0000 ug | Freq: Once | INTRAMUSCULAR | Status: AC
  Administered 2012-11-11: 480 ug via SUBCUTANEOUS
  Filled 2012-11-11: qty 0.8

## 2012-11-11 NOTE — Telephone Encounter (Signed)
Spoke with Diane Lozano and told her that her hgb was a little low at 8.5 today.  She stated that she is not experiencing any palpatations.  She is SOB going up stairs or walking  long distances.  This is not new for her.  She is fine at work sitting at her desk.  She does not want a blood transfusion.  She will call if she is more sob or experiencing palpitations prior to visit next week with Diane Lozano.

## 2012-11-12 ENCOUNTER — Telehealth: Payer: Self-pay | Admitting: *Deleted

## 2012-11-12 NOTE — Telephone Encounter (Signed)
CALLED PATIENT TO REMIND OF TX. FOR 11-13-12, LVM FOR A RETURN CALL

## 2012-11-13 ENCOUNTER — Ambulatory Visit
Admission: RE | Admit: 2012-11-13 | Discharge: 2012-11-13 | Disposition: A | Discharge status: Home / Self Care | Payer: 59 | Source: Ambulatory Visit | Attending: Radiation Oncology | Admitting: Radiation Oncology

## 2012-11-13 DIAGNOSIS — C541 Malignant neoplasm of endometrium: Secondary | ICD-10-CM

## 2012-11-13 NOTE — Progress Notes (Signed)
Department of Radiation Oncology  Phone: [phone]  Fax: [phone]   High-dose-rate brachytherapy procedure note    Vaginal brachytherapy procedure note   The patient was placed in the dorsolithotomy position. A pelvic exam was performed. There are no masses palpable. The vaginal cuff is well-healed without signs of breakdown or tumor. The patient proceeded to undergo serial dilation of the vaginal introitus. The vaginal cylinder was placed in the proximal vagina. This was affixed to the CT/MR stabilization plate to prevent slippage. The patient tolerated the procedure well with minimal discomfort.   Simple treatment device note   Patient had construction of her custom vaginal cylinder for her HDR treatments. She will be treated with three 3.0 cm rings placed proximally within the vagina. More distal will be to 2.5 cm rings.   Verification simulation   A fiducial marker was placed within the vaginal cylinder. An AP and lateral film was obtained in the treatment position. This was compared to the planning films earlier in the day documenting good position of the vaginal cylinder for treatment.   High-dose-rate brachytherapy treatment   The cylinder was attached to the the Nucletron safe by catheter system. The patient then proceeded to undergo her third treatment. She was prescribed a dose of 6 Gy to the proximal mucosal surface. This was achieved with 1 channel using 9 dwell positions. Treatment time was 428.8 seconds. The patient tolerated the procedure well. After completion of her therapy a radiation survey was performed documenting return of the iridium source into the Nucletron safe. -----------------------------------    Billie Lade, PhD, MD

## 2012-11-15 ENCOUNTER — Encounter: Payer: Self-pay | Admitting: Physician Assistant

## 2012-11-15 ENCOUNTER — Telehealth: Payer: Self-pay

## 2012-11-15 ENCOUNTER — Ambulatory Visit (HOSPITAL_BASED_OUTPATIENT_CLINIC_OR_DEPARTMENT_OTHER): Payer: 59 | Admitting: Physician Assistant

## 2012-11-15 ENCOUNTER — Other Ambulatory Visit (HOSPITAL_BASED_OUTPATIENT_CLINIC_OR_DEPARTMENT_OTHER): Payer: 59 | Admitting: Lab

## 2012-11-15 VITALS — BP 97/69 | HR 95 | Temp 98.4°F | Resp 18 | Ht 63.0 in | Wt 201.9 lb

## 2012-11-15 DIAGNOSIS — C541 Malignant neoplasm of endometrium: Secondary | ICD-10-CM

## 2012-11-15 DIAGNOSIS — L539 Erythematous condition, unspecified: Secondary | ICD-10-CM

## 2012-11-15 DIAGNOSIS — C549 Malignant neoplasm of corpus uteri, unspecified: Secondary | ICD-10-CM

## 2012-11-15 LAB — CBC WITH DIFFERENTIAL/PLATELET
BASO%: 1.2 % (ref 0.0–2.0)
Basophils Absolute: 0.1 10*3/uL (ref 0.0–0.1)
EOS%: 1.7 % (ref 0.0–7.0)
Eosinophils Absolute: 0.1 10*3/uL (ref 0.0–0.5)
HCT: 27.7 % — ABNORMAL LOW (ref 34.8–46.6)
HGB: 9.4 g/dL — ABNORMAL LOW (ref 11.6–15.9)
LYMPH%: 34 % (ref 14.0–49.7)
MCH: 33 pg (ref 25.1–34.0)
MCHC: 33.9 g/dL (ref 31.5–36.0)
MCV: 97.2 fL (ref 79.5–101.0)
MONO#: 0.6 10*3/uL (ref 0.1–0.9)
MONO%: 14.2 % — ABNORMAL HIGH (ref 0.0–14.0)
NEUT#: 2 10*3/uL (ref 1.5–6.5)
NEUT%: 48.9 % (ref 38.4–76.8)
Platelets: 142 10*3/uL — ABNORMAL LOW (ref 145–400)
RBC: 2.85 10*6/uL — ABNORMAL LOW (ref 3.70–5.45)
RDW: 18 % — ABNORMAL HIGH (ref 11.2–14.5)
WBC: 4.1 10*3/uL (ref 3.9–10.3)
lymph#: 1.4 10*3/uL (ref 0.9–3.3)

## 2012-11-15 NOTE — Progress Notes (Signed)
OFFICE PROGRESS NOTE   11/15/2012   Physicians:Elizabeth Skinner, MD, Antony Blackbird MD, Sigmund Hazel MD, Gerald Leitz MD, [facility]   INTERVAL HISTORY:   Patient is seen, alone for a work in visit for evaluation of left wrist discomfort, redness and swelling. She noted soreness of her left wrist yesterday but noted slight swelling and redness today. She denies any trauma to this area. She also has not had any insect bites or stings to this area. The discomfort in the left wrist was significant enough that she had 2 switch wearing her watch from the left wrist to the right wrist. Her most recent cycle of IV chemotherapy was given to the right upper extremity. She is followed by Dr. Darrold Span in continuing attention to adjuvant chemotherapy in process for IA high grade endometrial cancer, cycle 4 of q 3 week taxol/ [place] given on 11-01-2012 with neupogen x 3 days beginning 11-08-12. She began HDR by Dr Roselind Messier on 10-29-12. She does not have central catheter.  Oncology History  Patient presented to PCP Sigmund Hazel with intermittent vaginal bleeding since ~ Oct 2013. Dr Hyacinth Meeker did endometrial biopsy 06-20-12 which showed complex endometrial hyperplasia with atypia. She had pelvic and transvaginal US in [facility] system on 06-26-12, which showed endometrial thickening of 2.7 cm, right ovary not clearly seen and left ovary with normal postmenopausal appearance. She was seen by Dr Gerald Leitz and referred to Dr Clifton James. She had robotic hysterectomy with BSO and bilateral pelvic and periaortic node dissection by Dr Clifton James at [facility] on 07-30-2012. Intraoperatively she had large endometrial polyp with high grade malignancy, no obvious extrauterine disease. Pathology ([facility] [place] [id number] from 07-30-2012) had high grade, poorly differentiated endometrial adenocarcinoma with solid and serous papillary components, 2 periaortic nodes negative and 9 right pelvic/11 left pelvic  nodes negative. The tumor was 4.8 cm confined to polyp with no definite myometrial involvement, margins clear and no angiolymphatic invasion identified.  Cycle 1 taxol carboplatin was given 08-30-12, 6 cycles planned.    Review of Systems Left wrist tenderness, mild redness and swelling as described above present for the past 2 days. Denies fever or other signs or symptoms of infection. Appetite improved. No peripheral neuropathy. Back at work, still some fatigue. Remainder of 10 point Review of Systems negative.  Objective:  Vital signs in last 24 hours:  BP 97/69  Pulse 95  Temp(Src) 98.4 F (36.9 C) (Oral)  Resp 18  Ht 5\' 3"  (1.6 m)  Wt 201 lb 14.4 oz (91.581 kg)  BMI 35.77 kg/m2 Weight is up 4 lbs. Alopecia.  Easily ambulatory and in no acute distress   HEENT:PERRLA and sclera clear, anicteric Oral mucosa moist and clear.  Resp: clear to auscultation bilaterally and normal percussion bilaterally Cardio: regular rate and rhythm Extremities: Left proximal forearm with an area of minimal erythema approximately 2-1/2 cm in diameter with mild tenderness and scant warmth. There is no streaking presents remainder of the extremities are without point tenderness pitting edema or palpable cords Skin: mild erythema and tenderness, left proximal forearm  Lab Results:  Results for orders placed in visit on 11/15/12  CBC WITH DIFFERENTIAL      Result Value Range   WBC 4.1  3.9 - 10.3 10e3/uL   NEUT# 2.0  1.5 - 6.5 10e3/uL   HGB 9.4 (*) 11.6 - 15.9 g/dL   HCT 65.7 (*) 84.6 - 96.2 %   Platelets 142 (*) 145 - 400 10e3/uL   MCV  97.2  79.5 - 101.0 fL   MCH 33.0  25.1 - 34.0 pg   MCHC 33.9  31.5 - 36.0 g/dL   RBC 0.98 (*) 1.19 - 1.47 10e6/uL   RDW 18.0 (*) 11.2 - 14.5 %   lymph# 1.4  0.9 - 3.3 10e3/uL   MONO# 0.6  0.1 - 0.9 10e3/uL   Eosinophils Absolute 0.1  0.0 - 0.5 10e3/uL   Basophils Absolute 0.1  0.0 - 0.1 10e3/uL   NEUT% 48.9  38.4 - 76.8 %   LYMPH% 34.0  14.0 - 49.7 %    MONO% 14.2 (*) 0.0 - 14.0 %   EOS% 1.7  0.0 - 7.0 %   BASO% 1.2  0.0 - 2.0 %      Studies/Results:  No results found.  Medications: I have reviewed the patient's current medications.  Assessment/Plan: 1.IA high grade endometrial carcinoma involving endometrial polyp, post optimal surgery by Dr Clifton James 07-30-12, adjuvant taxol carboplatin q 3 weeks x 6 cycles begun 08-30-12, status post cycle 4  11-01-12 with Neupogen for 3 days beginning on 11/08/2012. She started HDR on 10/29/2012  2.Hx HTN but BP lower after chemo previously, so that she will hold BP meds if appropriate. She is aware of pushing po fluids after chemo, has preferred not to have IVF tho will let us know if unable to drink enough after treatment. 3.anemia: primarily related to chemo, on oral iron   4.overdue mammograms which she wants to do after chemo completes 5.respiratory infection essentially resolved. Tooth pain improved, but I have asked her to see dentist. 6. Left wrist induration with low suspicion for early cellulitis of unclear precipitating event. Patient discussed with Dr. Darrold Span This area was circled with pen ink so that the area is well circumscribed. She will utilize warm compresses throughout the weekend. If the erythema extends beyond the area of increased cervical she is to call the physician on call if it is over the weekend or call our office on Monday if it occurs first of the week, to be placed on a course of Keflex. She is otherwise to keep her scheduled appointment with Dr. Darrold Span on 11/19/2012.  Patient is in agreement with plan.  Conni Slipper, PA-C   11/15/2012, 4:12 PM

## 2012-11-15 NOTE — Telephone Encounter (Signed)
Diane Lozano called cc of left  wrist swelling.  Onset yesterday.  Area is the size of a golf ball, red, painful, and getting larger.  She is afebrile. She will come in to see Tiana Loft PA-C with a cbc  At 1345.  Pt. Aware of appointment time.

## 2012-11-15 NOTE — Patient Instructions (Addendum)
Use warm compresses on your left wrist as discussed. Should the area of redness though beyond the inked cervical called the physician on call at December the weekend or call our office on Monday so that she can be placed on a course of antibiotics Keep your scheduled appointment with Dr. Darrold Span on 11/19/2012

## 2012-11-17 ENCOUNTER — Other Ambulatory Visit: Payer: Self-pay | Admitting: Oncology

## 2012-11-19 ENCOUNTER — Ambulatory Visit (HOSPITAL_BASED_OUTPATIENT_CLINIC_OR_DEPARTMENT_OTHER): Payer: 59 | Admitting: Oncology

## 2012-11-19 ENCOUNTER — Encounter: Payer: Self-pay | Admitting: Oncology

## 2012-11-19 ENCOUNTER — Ambulatory Visit: Payer: 59 | Admitting: Radiation Oncology

## 2012-11-19 ENCOUNTER — Other Ambulatory Visit (HOSPITAL_BASED_OUTPATIENT_CLINIC_OR_DEPARTMENT_OTHER): Payer: 59 | Admitting: Lab

## 2012-11-19 ENCOUNTER — Telehealth: Payer: Self-pay | Admitting: Oncology

## 2012-11-19 VITALS — BP 130/73 | HR 100 | Temp 97.0°F | Resp 18 | Ht 63.0 in | Wt 201.1 lb

## 2012-11-19 DIAGNOSIS — T451X5A Adverse effect of antineoplastic and immunosuppressive drugs, initial encounter: Secondary | ICD-10-CM

## 2012-11-19 DIAGNOSIS — D6481 Anemia due to antineoplastic chemotherapy: Secondary | ICD-10-CM

## 2012-11-19 DIAGNOSIS — I10 Essential (primary) hypertension: Secondary | ICD-10-CM

## 2012-11-19 DIAGNOSIS — C549 Malignant neoplasm of corpus uteri, unspecified: Secondary | ICD-10-CM

## 2012-11-19 DIAGNOSIS — C541 Malignant neoplasm of endometrium: Secondary | ICD-10-CM

## 2012-11-19 LAB — CBC WITH DIFFERENTIAL/PLATELET
BASO%: 1.2 % (ref 0.0–2.0)
Basophils Absolute: 0 10*3/uL (ref 0.0–0.1)
EOS%: 2.5 % (ref 0.0–7.0)
Eosinophils Absolute: 0.1 10*3/uL (ref 0.0–0.5)
HCT: 25.4 % — ABNORMAL LOW (ref 34.8–46.6)
HGB: 9.1 g/dL — ABNORMAL LOW (ref 11.6–15.9)
LYMPH%: 28.7 % (ref 14.0–49.7)
MCH: 34.9 pg — ABNORMAL HIGH (ref 25.1–34.0)
MCHC: 36 g/dL (ref 31.5–36.0)
MCV: 97 fL (ref 79.5–101.0)
MONO#: 0.2 10*3/uL (ref 0.1–0.9)
MONO%: 7 % (ref 0.0–14.0)
NEUT#: 2 10*3/uL (ref 1.5–6.5)
NEUT%: 60.6 % (ref 38.4–76.8)
Platelets: 120 10*3/uL — ABNORMAL LOW (ref 145–400)
RBC: 2.62 10*6/uL — ABNORMAL LOW (ref 3.70–5.45)
RDW: 19.6 % — ABNORMAL HIGH (ref 11.2–14.5)
WBC: 3.3 10*3/uL — ABNORMAL LOW (ref 3.9–10.3)
lymph#: 0.9 10*3/uL (ref 0.9–3.3)

## 2012-11-19 LAB — COMPREHENSIVE METABOLIC PANEL (CC13)
ALT: 26 U/L (ref 0–55)
AST: 19 U/L (ref 5–34)
Albumin: 3.7 g/dL (ref 3.5–5.0)
Alkaline Phosphatase: 68 U/L (ref 40–150)
BUN: 20.4 mg/dL (ref 7.0–26.0)
CO2: 23 mEq/L (ref 22–29)
Calcium: 9.6 mg/dL (ref 8.4–10.4)
Chloride: 105 mEq/L (ref 98–107)
Creatinine: 0.8 mg/dL (ref 0.6–1.1)
Glucose: 122 mg/dl — ABNORMAL HIGH (ref 70–99)
Potassium: 3.7 mEq/L (ref 3.5–5.1)
Sodium: 139 mEq/L (ref 136–145)
Total Bilirubin: 0.7 mg/dL (ref 0.20–1.20)
Total Protein: 7.4 g/dL (ref 6.4–8.3)

## 2012-11-19 NOTE — Patient Instructions (Signed)
Use RUE for IV for chemo this week

## 2012-11-19 NOTE — Progress Notes (Signed)
OFFICE PROGRESS NOTE   11/19/2012   Physicians:Elizabeth Skinner, MD, Antony Blackbird MD, Sigmund Hazel MD, Gerald Leitz MD, [facility]   INTERVAL HISTORY: Patient is seen, alone for visit, in continuing attention to adjuvant chemotherapy for high grade IA endometrial cancer, due cycle 5 of q 3 week taxol/ carboplatin on 11-22-12.  Last 2  HDR treatments by Dr Roselind Messier are planned 6-19 and 11-26-12. She has needed neupogen support. She does not have central catheter.   Oncology History  Patient presented to PCP Sigmund Hazel with intermittent vaginal bleeding since ~ Oct 2013. Dr Hyacinth Meeker did endometrial biopsy 06-20-12 which showed complex endometrial hyperplasia with atypia. She had pelvic and transvaginal US in  system on 06-26-12, which showed endometrial thickening of 2.7 cm, right ovary not clearly seen and left ovary with normal postmenopausal appearance. She was seen by Dr Gerald Leitz and referred to Dr Clifton James. She had robotic hysterectomy with BSO and bilateral pelvic and periaortic node dissection by Dr Clifton James at [facility] on 07-30-2012. Intraoperatively she had large endometrial polyp with high grade malignancy, no obvious extrauterine disease. Pathology ([facility] [place] [id number] from 07-30-2012) had high grade, poorly differentiated endometrial adenocarcinoma with solid and serous papillary components, 2 periaortic nodes negative and 9 right pelvic/11 left pelvic nodes negative. The tumor was 4.8 cm confined to polyp with no definite myometrial involvement, margins clear and no angiolymphatic invasion identified.  Cycle 1 taxol carboplatin was given 08-30-12, 6 cycles planned. She has had 3 HDR treatments beginning 10-29-12, total of 5 planned per EMR information.  The area of tenderness and erythema left wrist has improved, still tender to direct pressure, no longer erythema, no swelling. She did not need antibiotics. This was several cm proximal to IV  site used with next to last chemotherapy. Patient is fatigued tho has been able to continue working; she is somewhat SOB climbing stairs or walking distances in parking lot.Constipation around chemo has been better controlled with increase in laxatives. She has no peripheral neuropathy symptoms now. Nausea is mostly controlled with antiemetics, and is better this week. She has had no fever or symptoms of infection. Remainder of 10 point Review of Systems negative.  Dr Clifton James is to see her next after treatment completes, and we will repeat CT in [facility] prior to that visit.   Objective:  Vital signs in last 24 hours:  BP 130/73  Pulse 100  Temp(Src) 97 F (36.1 C) (Oral)  Resp 18  Ht 5\' 3"  (1.6 m)  Wt 201 lb 1.6 oz (91.218 kg)  BMI 35.63 kg/m2  Easily ambulatory, NAD, very pleasant as always.  HEENT:PERRLA, extra ocular movement intact, sclera clear, anicteric and oropharynx clear, no lesions Alopecia LymphaticsCervical, supraclavicular, and axillary nodes normal. No inguinal adenopathy Resp: clear to auscultation bilaterally and normal percussion bilaterally Cardio: regular rate and rhythm GI: soft, non-tender; bowel sounds normal; no masses,  no organomegaly Extremities: extremities normal, atraumatic, no cyanosis or edema, with exception of 0.5 cm slightly tender area along vein dorsal left wrist, no swelling or erythema, no heat. Neuro:no sensory deficits noted Skin with no rash or ecchymoses  Lab Results:  Results for orders placed in visit on 11/19/12  CBC WITH DIFFERENTIAL      Result Value Range   WBC 3.3 (*) 3.9 - 10.3 10e3/uL   NEUT# 2.0  1.5 - 6.5 10e3/uL   HGB 9.1 (*) 11.6 - 15.9 g/dL   HCT 45.4 (*) 09.8 - 11.9 %  Platelets 120 (*) 145 - 400 10e3/uL   MCV 97.0  79.5 - 101.0 fL   MCH 34.9 (*) 25.1 - 34.0 pg   MCHC 36.0  31.5 - 36.0 g/dL   RBC 1.61 (*) 0.96 - 0.45 10e6/uL   RDW 19.6 (*) 11.2 - 14.5 %   lymph# 0.9  0.9 - 3.3 10e3/uL   MONO# 0.2  0.1 - 0.9  10e3/uL   Eosinophils Absolute 0.1  0.0 - 0.5 10e3/uL   Basophils Absolute 0.0  0.0 - 0.1 10e3/uL   NEUT% 60.6  38.4 - 76.8 %   LYMPH% 28.7  14.0 - 49.7 %   MONO% 7.0  0.0 - 14.0 %   EOS% 2.5  0.0 - 7.0 %   BASO% 1.2  0.0 - 2.0 %  COMPREHENSIVE METABOLIC PANEL (CC13)      Result Value Range   Sodium 139  136 - 145 mEq/L   Potassium 3.7  3.5 - 5.1 mEq/L   Chloride 105  98 - 107 mEq/L   CO2 23  22 - 29 mEq/L   Glucose 122 (*) 70 - 99 mg/dl   BUN 40.9  7.0 - 81.1 mg/dL   Creatinine 0.8  0.6 - 1.1 mg/dL   Total Bilirubin 9.14  0.20 - 1.20 mg/dL   Alkaline Phosphatase 68  40 - 150 U/L   AST 19  5 - 34 U/L   ALT 26  0 - 55 U/L   Total Protein 7.4  6.4 - 8.3 g/dL   Albumin 3.7  3.5 - 5.0 g/dL   Calcium 9.6  8.4 - 78.2 mg/dL     Studies/Results:  No results found.  Medications: I have reviewed the patient's current medications. She continues oral iron  Assessment/Plan: 1.IA high grade endometrial carcinoma involving endometrial polyp, post optimal surgery by Dr Clifton James 07-30-12, adjuvant taxol carboplatin q 3 weeks x 6 cycles begun 08-30-12, cycle 5 due 11-22-12 as lon as ANC >=1.5 and plt >=100k, and neupogen x 3 days beginning 11-29-12 (delay due to taxol aches). I will see her again on 12-02-12 with CBC and CMET prior to 6th cycle due 7- 11(+neupogen due [id number]). We will set up CT AP in late August, anticipating return visit to Dr Clifton James shortly after that. 2.Hx HTN: BP lower after chemo previously, so that she will hold BP meds if appropriate and understands to push po fluids after chemo, has preferred not to have IVF tho will let us know if unable to drink enough after treatment.  3.anemia: primarily related to chemo, on oral iron  4.overdue mammograms which she wants to do after chemo completes 5.family hx melanoma    Patient is in agreement with plan above.  LIVESAY,LENNIS P, MD   11/19/2012, 1:47 PM

## 2012-11-19 NOTE — Telephone Encounter (Signed)
sched pt appt per pof...lvm with Dr. Clifton James office that pt needed appt and to contact me and the pt for appt d/t...Marland Kitchenper pof pt will pick up sched at nxt visit.

## 2012-11-20 ENCOUNTER — Telehealth: Payer: Self-pay | Admitting: *Deleted

## 2012-11-20 ENCOUNTER — Telehealth: Payer: Self-pay | Admitting: Oncology

## 2012-11-20 NOTE — Telephone Encounter (Signed)
CALLED PATIENT TO REMIND OF HDR TX. FOR 11-21-12 AT 9:00 AM, SPOKE WITH PATIENT'S HUSBAND BOB AND HE IS AWARE OF THIS APPT.

## 2012-11-20 NOTE — Telephone Encounter (Signed)
Message copied by Phillis Knack on Wed Nov 20, 2012 10:05 AM ------      Message from: Reece Packer      Created: Tue Nov 19, 2012  8:29 PM       Please let her know that I am adding EMEND to her IV antiemetics with next chemo, as this may help nausea some better for the couple of days right after treatment. ------

## 2012-11-20 NOTE — Telephone Encounter (Signed)
Left patient a message on her cell phone (with her permission) with message below. To call if has questions

## 2012-11-20 NOTE — Telephone Encounter (Signed)
Message copied by Carola Rhine A on Wed Nov 20, 2012  1:24 PM ------      Message from: Diane Lozano      Created: Tue Nov 19, 2012  8:29 PM       Please let her know that I am adding EMEND to her IV antiemetics with next chemo, as this may help nausea some better for the couple of days right after treatment. ------

## 2012-11-20 NOTE — Telephone Encounter (Signed)
Left pt a message to call Dr Precious Reel office

## 2012-11-20 NOTE — Telephone Encounter (Signed)
I spoke with Marchelle Folks at Dr. Gibson Ramp office and they are switching over to Epic and she will not be able to schedule late Aug appointments until early Aug.  I will make sure to call her back to schedule.

## 2012-11-21 ENCOUNTER — Ambulatory Visit
Admission: RE | Admit: 2012-11-21 | Discharge: 2012-11-21 | Disposition: A | Discharge status: Home / Self Care | Payer: 59 | Source: Ambulatory Visit | Attending: Radiation Oncology | Admitting: Radiation Oncology

## 2012-11-21 DIAGNOSIS — C541 Malignant neoplasm of endometrium: Secondary | ICD-10-CM

## 2012-11-21 NOTE — Progress Notes (Signed)
Department of Radiation Oncology  Phone: [phone]  Fax: [phone]    High-dose-rate brachytherapy procedure note   Vaginal brachytherapy procedure note   The patient was placed in the dorsolithotomy position. A pelvic exam was performed. There are no masses palpable. The vaginal cuff is well-healed without signs of breakdown or tumor. The patient proceeded to undergo serial dilation of the vaginal introitus. The vaginal cylinder was placed in the proximal vagina. This was affixed to the CT/MR stabilization plate to prevent slippage. The patient tolerated the procedure well with minimal discomfort.   Simple treatment device note   Patient had construction of her custom vaginal cylinder for her HDR treatments. She will be treated with three 3.0 cm rings placed proximally within the vagina. More distal will be to 2.5 cm rings.   Verification simulation   A fiducial marker was placed within the vaginal cylinder. An AP and lateral film was obtained in the treatment position. This was compared to the planning films earlier in the day documenting good position of the vaginal cylinder for treatment.   High-dose-rate brachytherapy treatment   The cylinder was attached to the the Nucletron safe by catheter system. The patient then proceeded to undergo her fourth treatment. She was prescribed a dose of 6 Gy to the proximal mucosal surface. This was achieved with 1 channel using 9 dwell positions. Treatment time was 209.2 seconds. The patient tolerated the procedure well. After completion of her therapy a radiation survey was performed documenting return of the iridium source into the Nucletron safe.  The iridium source was changed out after the patient's third treatment. Therefore the activity was higher for this treatment and this treatment time was therefore shorter.   Billie Lade, PhD, MD

## 2012-11-22 ENCOUNTER — Ambulatory Visit (HOSPITAL_BASED_OUTPATIENT_CLINIC_OR_DEPARTMENT_OTHER): Payer: 59

## 2012-11-22 ENCOUNTER — Ambulatory Visit: Payer: 59 | Admitting: Nutrition

## 2012-11-22 ENCOUNTER — Other Ambulatory Visit (HOSPITAL_BASED_OUTPATIENT_CLINIC_OR_DEPARTMENT_OTHER): Payer: 59 | Admitting: Lab

## 2012-11-22 VITALS — BP 132/69 | HR 103 | Temp 97.9°F | Resp 18

## 2012-11-22 DIAGNOSIS — Z5111 Encounter for antineoplastic chemotherapy: Secondary | ICD-10-CM

## 2012-11-22 DIAGNOSIS — C549 Malignant neoplasm of corpus uteri, unspecified: Secondary | ICD-10-CM

## 2012-11-22 DIAGNOSIS — C541 Malignant neoplasm of endometrium: Secondary | ICD-10-CM

## 2012-11-22 LAB — CBC WITH DIFFERENTIAL/PLATELET
BASO%: 0 % (ref 0.0–2.0)
Basophils Absolute: 0 10*3/uL (ref 0.0–0.1)
EOS%: 0 % (ref 0.0–7.0)
Eosinophils Absolute: 0 10*3/uL (ref 0.0–0.5)
HCT: 28 % — ABNORMAL LOW (ref 34.8–46.6)
HGB: 9.4 g/dL — ABNORMAL LOW (ref 11.6–15.9)
LYMPH%: 16.6 % (ref 14.0–49.7)
MCH: 33 pg (ref 25.1–34.0)
MCHC: 33.6 g/dL (ref 31.5–36.0)
MCV: 98.2 fL (ref 79.5–101.0)
MONO#: 0 10*3/uL — ABNORMAL LOW (ref 0.1–0.9)
MONO%: 0.9 % (ref 0.0–14.0)
NEUT#: 1.8 10*3/uL (ref 1.5–6.5)
NEUT%: 82.5 % — ABNORMAL HIGH (ref 38.4–76.8)
Platelets: 101 10*3/uL — ABNORMAL LOW (ref 145–400)
RBC: 2.85 10*6/uL — ABNORMAL LOW (ref 3.70–5.45)
RDW: 17.7 % — ABNORMAL HIGH (ref 11.2–14.5)
WBC: 2.2 10*3/uL — ABNORMAL LOW (ref 3.9–10.3)
lymph#: 0.4 10*3/uL — ABNORMAL LOW (ref 0.9–3.3)
nRBC: 0 % (ref 0–0)

## 2012-11-22 MED ORDER — DEXAMETHASONE SODIUM PHOSPHATE 20 MG/5ML IJ SOLN
12.0000 mg | Freq: Once | INTRAMUSCULAR | Status: AC
  Administered 2012-11-22: 12 mg via INTRAVENOUS

## 2012-11-22 MED ORDER — FAMOTIDINE IN NACL 20-0.9 MG/50ML-% IV SOLN
20.0000 mg | Freq: Once | INTRAVENOUS | Status: AC
  Administered 2012-11-22: 20 mg via INTRAVENOUS

## 2012-11-22 MED ORDER — SODIUM CHLORIDE 0.9 % IJ SOLN
10.0000 mL | INTRAMUSCULAR | Status: DC | PRN
  Filled 2012-11-22: qty 10

## 2012-11-22 MED ORDER — SODIUM CHLORIDE 0.9 % IV SOLN
150.0000 mg | Freq: Once | INTRAVENOUS | Status: AC
  Administered 2012-11-22: 150 mg via INTRAVENOUS
  Filled 2012-11-22: qty 5

## 2012-11-22 MED ORDER — ONDANSETRON 16 MG/50ML IVPB (CHCC)
16.0000 mg | Freq: Once | INTRAVENOUS | Status: AC
  Administered 2012-11-22: 16 mg via INTRAVENOUS

## 2012-11-22 MED ORDER — HEPARIN SOD (PORK) LOCK FLUSH 100 UNIT/ML IV SOLN
500.0000 [IU] | Freq: Once | INTRAVENOUS | Status: DC | PRN
  Filled 2012-11-22: qty 5

## 2012-11-22 MED ORDER — DIPHENHYDRAMINE HCL 50 MG/ML IJ SOLN
50.0000 mg | Freq: Once | INTRAMUSCULAR | Status: AC
  Administered 2012-11-22: 50 mg via INTRAVENOUS

## 2012-11-22 MED ORDER — SODIUM CHLORIDE 0.9 % IV SOLN
540.0000 mg | Freq: Once | INTRAVENOUS | Status: AC
  Administered 2012-11-22: 540 mg via INTRAVENOUS
  Filled 2012-11-22: qty 54

## 2012-11-22 MED ORDER — SODIUM CHLORIDE 0.9 % IV SOLN
Freq: Once | INTRAVENOUS | Status: AC
  Administered 2012-11-22: 10:00:00 via INTRAVENOUS

## 2012-11-22 MED ORDER — PACLITAXEL CHEMO INJECTION 300 MG/50ML
175.0000 mg/m2 | Freq: Once | INTRAVENOUS | Status: AC
  Administered 2012-11-22: 348 mg via INTRAVENOUS
  Filled 2012-11-22: qty 58

## 2012-11-22 NOTE — Progress Notes (Signed)
Patient reports she feels well. Her nausea and constipation are better controlled. She has gained 2 pounds and her weight was documented as 201.1 pounds June 17 from 199.1 pounds May 27. She still struggles with fluids around chemotherapy. She is working to find ways to drink more water.  Nutrition diagnosis: Food and nutrition related knowledge deficit has resolved.  Patient was encouraged to continue to work to increase fluids. There is no followup scheduled however patient does have my contact information if she has questions or concerns.

## 2012-11-22 NOTE — Patient Instructions (Addendum)
[facility] Discharge Instructions for Patients Receiving Chemotherapy  Today you received the following chemotherapy agents: Taxol and Carboplatin. To help prevent nausea and vomiting after your treatment, we encourage you to take your nausea medication, Zofran. Begin the morning of 6/21. Take one twice daily for the next two days.   If you develop nausea and vomiting that is not controlled by your nausea medication, call the clinic.   BELOW ARE SYMPTOMS THAT SHOULD BE REPORTED IMMEDIATELY:  *FEVER GREATER THAN 100.5 F  *CHILLS WITH OR WITHOUT FEVER  NAUSEA AND VOMITING THAT IS NOT CONTROLLED WITH YOUR NAUSEA MEDICATION  *UNUSUAL SHORTNESS OF BREATH  *UNUSUAL BRUISING OR BLEEDING  TENDERNESS IN MOUTH AND THROAT WITH OR WITHOUT PRESENCE OF ULCERS  *URINARY PROBLEMS  *BOWEL PROBLEMS  UNUSUAL RASH Items with * indicate a potential emergency and should be followed up as soon as possible.  Feel free to call the clinic should you have any questions or concerns. The clinic phone number is [phone].

## 2012-11-26 ENCOUNTER — Telehealth: Payer: Self-pay | Admitting: *Deleted

## 2012-11-26 ENCOUNTER — Ambulatory Visit: Payer: 59 | Admitting: Radiation Oncology

## 2012-11-26 NOTE — Telephone Encounter (Signed)
CALLED PATIENT TO REMIND OF APPT. FOR 11-27-12 AT 9 AM, LVM FOR A RETURN CALL

## 2012-11-27 ENCOUNTER — Ambulatory Visit
Admission: RE | Admit: 2012-11-27 | Discharge: 2012-11-27 | Disposition: A | Discharge status: Home / Self Care | Payer: 59 | Source: Ambulatory Visit | Attending: Radiation Oncology | Admitting: Radiation Oncology

## 2012-11-27 DIAGNOSIS — R300 Dysuria: Secondary | ICD-10-CM | POA: Insufficient documentation

## 2012-11-27 DIAGNOSIS — C541 Malignant neoplasm of endometrium: Secondary | ICD-10-CM

## 2012-11-27 LAB — URINALYSIS, MICROSCOPIC - CHCC
Bilirubin (Urine): NEGATIVE
Glucose: NEGATIVE mg/dL
Ketones: NEGATIVE mg/dL
Nitrite: NEGATIVE
Protein: <30 mg/dL
Specific Gravity, Urine: 1.02 (ref 1.003–1.035)
Urobilinogen, UR: 0.2 mg/dL (ref 0.2–1)
pH: 6 (ref 4.6–8.0)

## 2012-11-27 NOTE — Progress Notes (Signed)
   Department of Radiation Oncology  Phone:  [phone] Fax:        [phone]  High-dose-rate brachytherapy procedure note   Vaginal brachytherapy procedure note   The patient was placed in the dorsolithotomy position. A pelvic exam was performed. There are no masses palpable. The vaginal cuff is well-healed without signs of breakdown or tumor. The patient proceeded to undergo serial dilation of the vaginal introitus. The vaginal cylinder was placed in the proximal vagina. This was affixed to the CT/MR stabilization plate to prevent slippage. The patient tolerated the procedure well with minimal discomfort.   Simple treatment device note   Patient had construction of her custom vaginal cylinder for her HDR treatments. She will be treated with three 3.0 cm rings placed proximally within the vagina. More distal will be to 2.5 cm rings.   Verification simulation   A fiducial marker was placed within the vaginal cylinder. An AP and lateral film was obtained in the treatment position. This was compared to the planning films earlier in the day documenting good position of the vaginal cylinder for treatment.   High-dose-rate brachytherapy treatment   The cylinder was attached to the the Nucletron safe by catheter system. The patient then proceeded to undergo her fifth treatment. She was prescribed a dose of 6 Gy to the proximal mucosal surface. This was achieved with 1 channel using 9 dwell positions. Treatment time was 221.1 seconds. The patient tolerated the procedure well. After completion of her therapy a radiation survey was performed documenting return of the iridium source into the Nucletron safe. The iridium source was changed out after the patient's third treatment. Therefore the activity was higher for this treatment and this treatment time was therefore shorter.   Billie Lade, PhD, MD

## 2012-11-28 ENCOUNTER — Telehealth: Payer: Self-pay | Admitting: Oncology

## 2012-11-28 LAB — URINE CULTURE

## 2012-11-28 NOTE — Telephone Encounter (Signed)
returned pt call and advised on which of the three inj i could change to lunch time per pt request....advised pt on what i had available on 6.30.14 and to call back if she wants to still r/s the appt

## 2012-11-29 ENCOUNTER — Ambulatory Visit (HOSPITAL_BASED_OUTPATIENT_CLINIC_OR_DEPARTMENT_OTHER): Payer: 59

## 2012-11-29 VITALS — BP 119/61 | HR 100 | Temp 98.0°F

## 2012-11-29 DIAGNOSIS — D709 Neutropenia, unspecified: Secondary | ICD-10-CM

## 2012-11-29 DIAGNOSIS — C541 Malignant neoplasm of endometrium: Secondary | ICD-10-CM

## 2012-11-29 MED ORDER — FILGRASTIM 480 MCG/0.8ML IJ SOLN
480.0000 ug | Freq: Once | INTRAMUSCULAR | Status: AC
  Administered 2012-11-29: 480 ug via SUBCUTANEOUS
  Filled 2012-11-29: qty 0.8

## 2012-11-30 ENCOUNTER — Ambulatory Visit: Payer: 59

## 2012-11-30 ENCOUNTER — Ambulatory Visit (HOSPITAL_BASED_OUTPATIENT_CLINIC_OR_DEPARTMENT_OTHER): Payer: 59

## 2012-11-30 VITALS — BP 120/64 | HR 68 | Temp 97.8°F | Resp 18

## 2012-11-30 DIAGNOSIS — D709 Neutropenia, unspecified: Secondary | ICD-10-CM

## 2012-11-30 MED ORDER — FILGRASTIM 480 MCG/0.8ML IJ SOLN
480.0000 ug | Freq: Once | INTRAMUSCULAR | Status: AC
  Administered 2012-11-30: 480 ug via SUBCUTANEOUS

## 2012-12-02 ENCOUNTER — Encounter: Payer: Self-pay | Admitting: Oncology

## 2012-12-02 ENCOUNTER — Telehealth: Payer: Self-pay | Admitting: Oncology

## 2012-12-02 ENCOUNTER — Other Ambulatory Visit: Payer: Self-pay | Admitting: *Deleted

## 2012-12-02 ENCOUNTER — Other Ambulatory Visit (HOSPITAL_BASED_OUTPATIENT_CLINIC_OR_DEPARTMENT_OTHER): Payer: 59 | Admitting: Lab

## 2012-12-02 ENCOUNTER — Ambulatory Visit (HOSPITAL_BASED_OUTPATIENT_CLINIC_OR_DEPARTMENT_OTHER): Payer: 59

## 2012-12-02 ENCOUNTER — Encounter: Payer: Self-pay | Admitting: Radiation Oncology

## 2012-12-02 ENCOUNTER — Telehealth: Payer: Self-pay | Admitting: *Deleted

## 2012-12-02 ENCOUNTER — Ambulatory Visit (HOSPITAL_BASED_OUTPATIENT_CLINIC_OR_DEPARTMENT_OTHER): Payer: 59 | Admitting: Oncology

## 2012-12-02 ENCOUNTER — Encounter (HOSPITAL_COMMUNITY)
Admission: RE | Admit: 2012-12-02 | Discharge: 2012-12-02 | Disposition: A | Discharge status: Home / Self Care | Payer: 59 | Source: Ambulatory Visit | Attending: Oncology | Admitting: Oncology

## 2012-12-02 VITALS — BP 105/66 | HR 100 | Temp 97.6°F | Resp 20 | Ht 63.0 in | Wt 200.6 lb

## 2012-12-02 VITALS — BP 110/56 | HR 83 | Temp 98.3°F | Resp 18

## 2012-12-02 VITALS — BP 124/58 | HR 112 | Temp 98.6°F

## 2012-12-02 DIAGNOSIS — D649 Anemia, unspecified: Secondary | ICD-10-CM

## 2012-12-02 DIAGNOSIS — D709 Neutropenia, unspecified: Secondary | ICD-10-CM

## 2012-12-02 DIAGNOSIS — E876 Hypokalemia: Secondary | ICD-10-CM

## 2012-12-02 DIAGNOSIS — T451X5A Adverse effect of antineoplastic and immunosuppressive drugs, initial encounter: Secondary | ICD-10-CM

## 2012-12-02 DIAGNOSIS — C541 Malignant neoplasm of endometrium: Secondary | ICD-10-CM

## 2012-12-02 DIAGNOSIS — D6481 Anemia due to antineoplastic chemotherapy: Secondary | ICD-10-CM

## 2012-12-02 DIAGNOSIS — C549 Malignant neoplasm of corpus uteri, unspecified: Secondary | ICD-10-CM

## 2012-12-02 DIAGNOSIS — D696 Thrombocytopenia, unspecified: Secondary | ICD-10-CM

## 2012-12-02 LAB — COMPREHENSIVE METABOLIC PANEL (CC13)
ALT: 20 U/L (ref 0–55)
AST: 14 U/L (ref 5–34)
Albumin: 3.6 g/dL (ref 3.5–5.0)
Alkaline Phosphatase: 59 U/L (ref 40–150)
BUN: 19.4 mg/dL (ref 7.0–26.0)
CO2: 24 mEq/L (ref 22–29)
Calcium: 9.4 mg/dL (ref 8.4–10.4)
Chloride: 104 mEq/L (ref 98–109)
Creatinine: 0.8 mg/dL (ref 0.6–1.1)
Glucose: 112 mg/dl (ref 70–140)
Potassium: 3.1 mEq/L — ABNORMAL LOW (ref 3.5–5.1)
Sodium: 138 mEq/L (ref 136–145)
Total Bilirubin: 0.5 mg/dL (ref 0.20–1.20)
Total Protein: 7 g/dL (ref 6.4–8.3)

## 2012-12-02 LAB — CBC WITH DIFFERENTIAL/PLATELET
BASO%: 1.2 % (ref 0.0–2.0)
Basophils Absolute: 0 10*3/uL (ref 0.0–0.1)
EOS%: 3.6 % (ref 0.0–7.0)
Eosinophils Absolute: 0.1 10*3/uL (ref 0.0–0.5)
HCT: 21.1 % — ABNORMAL LOW (ref 34.8–46.6)
HGB: 7.2 g/dL — ABNORMAL LOW (ref 11.6–15.9)
LYMPH%: 56.8 % — ABNORMAL HIGH (ref 14.0–49.7)
MCH: 33.2 pg (ref 25.1–34.0)
MCHC: 34.1 g/dL (ref 31.5–36.0)
MCV: 97.2 fL (ref 79.5–101.0)
MONO#: 0.2 10*3/uL (ref 0.1–0.9)
MONO%: 13.6 % (ref 0.0–14.0)
NEUT#: 0.4 10*3/uL — CL (ref 1.5–6.5)
NEUT%: 24.8 % — ABNORMAL LOW (ref 38.4–76.8)
Platelets: 81 10*3/uL — ABNORMAL LOW (ref 145–400)
RBC: 2.17 10*6/uL — ABNORMAL LOW (ref 3.70–5.45)
RDW: 15.1 % — ABNORMAL HIGH (ref 11.2–14.5)
WBC: 1.7 10*3/uL — ABNORMAL LOW (ref 3.9–10.3)
lymph#: 1 10*3/uL (ref 0.9–3.3)
nRBC: 1 % — ABNORMAL HIGH (ref 0–0)

## 2012-12-02 LAB — PREPARE RBC (CROSSMATCH)

## 2012-12-02 LAB — ABO/RH: ABO/RH(D): O POS

## 2012-12-02 MED ORDER — FILGRASTIM 480 MCG/0.8ML IJ SOLN
480.0000 ug | Freq: Once | INTRAMUSCULAR | Status: AC
  Administered 2012-12-02: 480 ug via SUBCUTANEOUS
  Filled 2012-12-02: qty 0.8

## 2012-12-02 MED ORDER — SODIUM CHLORIDE 0.9 % IV SOLN
250.0000 mL | Freq: Once | INTRAVENOUS | Status: AC
  Administered 2012-12-02: 250 mL via INTRAVENOUS

## 2012-12-02 MED ORDER — SODIUM CHLORIDE 0.9 % IV SOLN
Freq: Once | INTRAVENOUS | Status: AC
  Administered 2012-12-02: 10:00:00 via INTRAVENOUS
  Filled 2012-12-02: qty 500

## 2012-12-02 MED ORDER — SODIUM CHLORIDE 0.9 % IV SOLN
Freq: Once | INTRAVENOUS | Status: AC
  Administered 2012-12-02: 10:00:00 via INTRAVENOUS

## 2012-12-02 MED ORDER — CIPROFLOXACIN HCL 250 MG PO TABS: 250.0000 mg | ORAL_TABLET | Freq: Two times a day (BID) | ORAL | Status: DC

## 2012-12-02 MED ORDER — POTASSIUM CHLORIDE CRYS ER 20 MEQ PO TBCR
20.0000 meq | EXTENDED_RELEASE_TABLET | Freq: Once | ORAL | Status: AC
  Administered 2012-12-02: 20 meq via ORAL
  Filled 2012-12-02: qty 1

## 2012-12-02 NOTE — Telephone Encounter (Signed)
, °

## 2012-12-02 NOTE — Telephone Encounter (Signed)
Left vm w/call back name and number and this RN's hours.

## 2012-12-02 NOTE — Progress Notes (Signed)
  Radiation Oncology         [phone] ________________________________  Name: Diane Lozano MRN: [id number]  Date: 12/02/2012  DOB: Oct 15, 1955  End of Treatment Note  Diagnosis:    Stage I-A high grade endometrial carcinoma involving an endometrial polyp     Indication for treatment:  Risk for vaginal cuff recurrence       Radiation treatment dates:   May 27, June 5,  June 11, June 19, June 25  Site/dose:   Proximal vagina 30 Gy in 5 fractions  Beams/energy:   The patient was treated with intracavitary high dose rate radiation therapy using iridium 192.  A 3 cm diameter cylinder was used to deliver the treatment with a 4 CM treatment length.  Prescription was to the mucosal surface.  Narrative: The patient tolerated radiation treatment relatively well. She had some mild vaginal/vulvar discomfort towards the end of her therapy as well as some mild dysuria  Plan: The patient has completed radiation treatment. The patient will return to radiation oncology clinic for routine followup in one month. I advised them to call or return sooner if they have any questions or concerns related to their recovery or treatment.  -----------------------------------  Billie Lade, PhD, MD

## 2012-12-02 NOTE — Patient Instructions (Addendum)
Blood Transfusion Information WHAT IS A BLOOD TRANSFUSION? A transfusion is the replacement of blood or some of its parts. Blood is made up of multiple cells which provide different functions.  Red blood cells carry oxygen and are used for blood loss replacement.  White blood cells fight against infection.  Platelets control bleeding.  Plasma helps clot blood.  Other blood products are available for specialized needs, such as hemophilia or other clotting disorders. BEFORE THE TRANSFUSION  Who gives blood for transfusions?   You may be able to donate blood to be used at a later date on yourself (autologous donation).  Relatives can be asked to donate blood. This is generally not any safer than if you have received blood from a stranger. The same precautions are taken to ensure safety when a relative's blood is donated.  Healthy volunteers who are fully evaluated to make sure their blood is safe. This is blood bank blood. Transfusion therapy is the safest it has ever been in the practice of medicine. Before blood is taken from a donor, a complete history is taken to make sure that person has no history of diseases nor engages in risky social behavior (examples are intravenous drug use or sexual activity with multiple partners). The donor's travel history is screened to minimize risk of transmitting infections, such as malaria. The donated blood is tested for signs of infectious diseases, such as HIV and hepatitis. The blood is then tested to be sure it is compatible with you in order to minimize the chance of a transfusion reaction. If you or a relative donates blood, this is often done in anticipation of surgery and is not appropriate for emergency situations. It takes many days to process the donated blood. RISKS AND COMPLICATIONS Although transfusion therapy is very safe and saves many lives, the main dangers of transfusion include:   Getting an infectious disease.  Developing a  transfusion reaction. This is an allergic reaction to something in the blood you were given. Every precaution is taken to prevent this. The decision to have a blood transfusion has been considered carefully by your caregiver before blood is given. Blood is not given unless the benefits outweigh the risks. AFTER THE TRANSFUSION  Right after receiving a blood transfusion, you will usually feel much better and more energetic. This is especially true if your red blood cells have gotten low (anemic). The transfusion raises the level of the red blood cells which carry oxygen, and this usually causes an energy increase.  The nurse administering the transfusion will monitor you carefully for complications. HOME CARE INSTRUCTIONS  No special instructions are needed after a transfusion. You may find your energy is better. Speak with your caregiver about any limitations on activity for underlying diseases you may have. SEEK MEDICAL CARE IF:   Your condition is not improving after your transfusion.  You develop redness or irritation at the intravenous (IV) site. SEEK IMMEDIATE MEDICAL CARE IF:  Any of the following symptoms occur over the next 12 hours:  Shaking chills.  You have a temperature by mouth above 102 F (38.9 C), not controlled by medicine.  Chest, back, or muscle pain.  People around you feel you are not acting correctly or are confused.  Shortness of breath or difficulty breathing.  Dizziness and fainting.  You get a rash or develop hives.  You have a decrease in urine output.  Your urine turns a dark color or changes to pink, red, or brown. Any of the following   symptoms occur over the next 10 days:  You have a temperature by mouth above 102 F (38.9 C), not controlled by medicine.  Shortness of breath.  Weakness after normal activity.  The white part of the eye turns yellow (jaundice).  You have a decrease in the amount of urine or are urinating less often.  Your  urine turns a dark color or changes to pink, red, or brown. Document Released: 05/19/2000 Document Revised: 08/14/2011 Document Reviewed: 01/06/2008 ExitCare Patient Information 2014 [employer].  

## 2012-12-02 NOTE — Progress Notes (Signed)
OFFICE PROGRESS NOTE   12/02/2012   Physicians:Elizabeth Skinner, MD, Antony Blackbird MD, Sigmund Hazel MD, Gerald Leitz MD, [facility]   INTERVAL HISTORY:  Patient is seen, alone for visit, in continuing attention to adjuvant chemotherapy with taxol carboplatin for high grade IA endometrial cancer. She had cycle 5 chemotherapy on 11-22-12, with neupogen 6-27 and 11-30-12, and is neutropenic today, as well as more anemic and thrombocytopenic. Note carbo had been dose reduced to AUC = 4 with taxol at 175 mg/m2. She had HDR by Dr Roselind Messier, with last treatment (#5) given 11-27-12. She has been fatigued and SOB with exertion, but no fever and no bleeding.   Oncology History  Patient presented to PCP Sigmund Hazel with intermittent vaginal bleeding since ~ Oct 2013. Dr Hyacinth Meeker did endometrial biopsy 06-20-12 which showed complex endometrial hyperplasia with atypia. She had pelvic and transvaginal US in [place] system on 06-26-12, which showed endometrial thickening of 2.7 cm, right ovary not clearly seen and left ovary with normal postmenopausal appearance. She was seen by Dr Gerald Leitz and referred to Dr Clifton James. She had robotic hysterectomy with BSO and bilateral pelvic and periaortic node dissection by Dr Clifton James at [facility] on 07-30-2012. Intraoperatively she had large endometrial polyp with high grade malignancy, no obvious extrauterine disease. Pathology ([facility] [place] [id number] from 07-30-2012) had high grade, poorly differentiated endometrial adenocarcinoma with solid and serous papillary components, 2 periaortic nodes negative and 9 right pelvic/11 left pelvic nodes negative. The tumor was 4.8 cm confined to polyp with no definite myometrial involvement, margins clear and no angiolymphatic invasion identified.  Cycle 1 taxol carboplatin was given 08-30-12, 6 cycles planned.    Too fatigued to shop over weekend, spent all of yesterday in bed. Nausea better now, has been  trying to push fluids (water) and has been eating. Some constipation just after chemo, bowels moving well now. No bladder symptoms. No chest pain and not SOB at rest, but has dyspnea with just moderate exertion. Remainder of 10 point Review of Systems negative.  Objective:  Vital signs in last 24 hours:  BP 105/66  Pulse 100  Temp(Src) 97.6 F (36.4 C) (Oral)  Resp 20  Ht 5\' 3"  (1.6 m)  Wt 200 lb 9.6 oz (90.992 kg)  BMI 35.54 kg/m2  Weight is down 1 lb. Alert, looks tired and pale, not SOB seated in exam room.   HEENT:PERRLA, sclera clear, anicteric and oropharynx clear, no lesions Alopecia LymphaticsCervical, supraclavicular, and axillary nodes normal. Resp: clear to auscultation bilaterally and normal percussion bilaterally Cardio: regular rate and rhythm GI: soft, nontender, not distended, quiet, no HSM or mass, surgical incision not remarkable Extremities: extremities normal, atraumatic, no cyanosis or edema Neuro:no sensory deficits noted Skin pale, not icteric, no petechiae or rash, no ecchymoses  Lab Results:  Results for orders placed in visit on 12/02/12  CBC WITH DIFFERENTIAL      Result Value Range   WBC 1.7 (*) 3.9 - 10.3 10e3/uL   NEUT# 0.4 (*) 1.5 - 6.5 10e3/uL   HGB 7.2 (*) 11.6 - 15.9 g/dL   HCT 45.4 (*) 09.8 - 11.9 %   Platelets 81 (*) 145 - 400 10e3/uL   MCV 97.2  79.5 - 101.0 fL   MCH 33.2  25.1 - 34.0 pg   MCHC 34.1  31.5 - 36.0 g/dL   RBC 1.47 (*) 8.29 - 5.62 10e6/uL   RDW 15.1 (*) 11.2 - 14.5 %   lymph# 1.0  0.9 -  3.3 10e3/uL   MONO# 0.2  0.1 - 0.9 10e3/uL   Eosinophils Absolute 0.1  0.0 - 0.5 10e3/uL   Basophils Absolute 0.0  0.0 - 0.1 10e3/uL   NEUT% 24.8 (*) 38.4 - 76.8 %   LYMPH% 56.8 (*) 14.0 - 49.7 %   MONO% 13.6  0.0 - 14.0 %   EOS% 3.6  0.0 - 7.0 %   BASO% 1.2  0.0 - 2.0 %   nRBC 1 (*) 0 - 0 %  COMPREHENSIVE METABOLIC PANEL (CC13)      Result Value Range   Sodium 138  136 - 145 mEq/L   Potassium 3.1 (*) 3.5 - 5.1 mEq/L   Chloride  104  98 - 109 mEq/L   CO2 24  22 - 29 mEq/L   Glucose 112  70 - 140 mg/dl   BUN 16.1  7.0 - 09.6 mg/dL   Creatinine 0.8  0.6 - 1.1 mg/dL   Total Bilirubin 0.45  0.20 - 1.20 mg/dL   Alkaline Phosphatase 59  40 - 150 U/L   AST 14  5 - 34 U/L   ALT 20  0 - 55 U/L   Total Protein 7.0  6.4 - 8.3 g/dL   Albumin 3.6  3.5 - 5.0 g/dL   Calcium 9.4  8.4 - 40.9 mg/dL     Studies/Results:  No results found.  Medications: I have reviewed the patient's current medications. She will have neupogen today and 7-1, then also 7-2 depending on CBC that day.  She will have IV and po potassium at [facility] now and will repeat CMET also on 12-04-12. She will use cipro 250 mg bid while neutropenic.  Patient has been instructed in neutropenic precautions. She should not be at work until [facility] >1.0.  Assessment/Plan: 1. Pancytopenia day 11 cycle 5 taxol/ carboplatin: patient agrees to one unit PRBCs today for symptomatic anemia (she declined 2 units). She continues neupogen as above and neutropenic precautions have been reviewed, prophylactic cipro until ANC >1.0.  Will repeat CBC 12-04-12 and with MD appointment 1-2 days prior to cycle 6 chemo due 12-13-12. 2. IA high grade endometrial carcinoma involving endometrial polyp: history as above. HDR completed6-25-14 with follow up appointment to Dr Roselind Messier 01-02-13. Plan total 6 cycles taxol carboplatin, tho may need to delay cycle 6 if counts not recovered by 7-11, and may need to decrease carboplatin AUC for final cycle. She will need to be followed by medical oncology at least until she has completely recovered from chemotherapy, and from there as requested by gyn oncology and rad onc. She should have CT AP prior to return visit to Dr Clifton James ~ a month after completion of chemo. 3.overdue mammograms, which she wants done after chemo completes 4. Hx HTN: she holds BP meds after chemo as needed 5.family history melanoma 6. Previous obesity, intentional weight loss prior to this  diagnosis     LIVESAY,LENNIS P, MD   12/02/2012, 10:39 AM

## 2012-12-03 ENCOUNTER — Telehealth: Payer: Self-pay | Admitting: *Deleted

## 2012-12-03 ENCOUNTER — Ambulatory Visit (HOSPITAL_BASED_OUTPATIENT_CLINIC_OR_DEPARTMENT_OTHER): Payer: 59

## 2012-12-03 VITALS — BP 138/57 | HR 108 | Temp 98.6°F

## 2012-12-03 DIAGNOSIS — D709 Neutropenia, unspecified: Secondary | ICD-10-CM

## 2012-12-03 DIAGNOSIS — C541 Malignant neoplasm of endometrium: Secondary | ICD-10-CM

## 2012-12-03 LAB — TYPE AND SCREEN
ABO/RH(D): O POS
Antibody Screen: NEGATIVE
Unit division: 0

## 2012-12-03 MED ORDER — FILGRASTIM 480 MCG/0.8ML IJ SOLN
480.0000 ug | Freq: Once | INTRAMUSCULAR | Status: AC
  Administered 2012-12-03: 480 ug via SUBCUTANEOUS
  Filled 2012-12-03: qty 0.8

## 2012-12-03 NOTE — Telephone Encounter (Signed)
Per staff phone call and POF I have schedueld appts.  JMW  

## 2012-12-03 NOTE — Telephone Encounter (Signed)
Per Dr Roselind Messier, called pt and left vm informing her that her UA and culture results were negative. Left call back name and number for any questions. Also reminded pt she can view these results through My Chart.

## 2012-12-04 ENCOUNTER — Other Ambulatory Visit (HOSPITAL_BASED_OUTPATIENT_CLINIC_OR_DEPARTMENT_OTHER): Payer: 59 | Admitting: Lab

## 2012-12-04 ENCOUNTER — Ambulatory Visit: Payer: 59

## 2012-12-04 DIAGNOSIS — C541 Malignant neoplasm of endometrium: Secondary | ICD-10-CM

## 2012-12-04 DIAGNOSIS — C549 Malignant neoplasm of corpus uteri, unspecified: Secondary | ICD-10-CM

## 2012-12-04 DIAGNOSIS — E876 Hypokalemia: Secondary | ICD-10-CM

## 2012-12-04 LAB — BASIC METABOLIC PANEL (CC13)
BUN: 15.6 mg/dL (ref 7.0–26.0)
CO2: 24 mEq/L (ref 22–29)
Calcium: 9.4 mg/dL (ref 8.4–10.4)
Chloride: 104 mEq/L (ref 98–109)
Creatinine: 0.7 mg/dL (ref 0.6–1.1)
Glucose: 109 mg/dl (ref 70–140)
Potassium: 3.4 mEq/L — ABNORMAL LOW (ref 3.5–5.1)
Sodium: 138 mEq/L (ref 136–145)

## 2012-12-04 LAB — CBC WITH DIFFERENTIAL/PLATELET
BASO%: 0.5 % (ref 0.0–2.0)
Basophils Absolute: 0.1 10*3/uL (ref 0.0–0.1)
EOS%: 1.3 % (ref 0.0–7.0)
Eosinophils Absolute: 0.1 10*3/uL (ref 0.0–0.5)
HCT: 26.2 % — ABNORMAL LOW (ref 34.8–46.6)
HGB: 9.2 g/dL — ABNORMAL LOW (ref 11.6–15.9)
LYMPH%: 15.5 % (ref 14.0–49.7)
MCH: 34.7 pg — ABNORMAL HIGH (ref 25.1–34.0)
MCHC: 35.3 g/dL (ref 31.5–36.0)
MCV: 98.1 fL (ref 79.5–101.0)
MONO#: 0.7 10*3/uL (ref 0.1–0.9)
MONO%: 7.7 % (ref 0.0–14.0)
NEUT#: 7 10*3/uL — ABNORMAL HIGH (ref 1.5–6.5)
NEUT%: 75 % (ref 38.4–76.8)
Platelets: 91 10*3/uL — ABNORMAL LOW (ref 145–400)
RBC: 2.67 10*6/uL — ABNORMAL LOW (ref 3.70–5.45)
RDW: 18 % — ABNORMAL HIGH (ref 11.2–14.5)
WBC: 9.3 10*3/uL (ref 3.9–10.3)
lymph#: 1.4 10*3/uL (ref 0.9–3.3)

## 2012-12-04 MED ORDER — FILGRASTIM 480 MCG/0.8ML IJ SOLN
480.0000 ug | Freq: Once | INTRAMUSCULAR | Status: DC
  Filled 2012-12-04: qty 0.8

## 2012-12-04 NOTE — Progress Notes (Signed)
Labs drawn today.   ANC 7.0   Cancel Neupogen injection.  May return to work as per Dr Darrold Span.

## 2012-12-05 ENCOUNTER — Telehealth: Payer: Self-pay

## 2012-12-05 DIAGNOSIS — C541 Malignant neoplasm of endometrium: Secondary | ICD-10-CM

## 2012-12-05 MED ORDER — POTASSIUM CHLORIDE ER 10 MEQ PO TBCR: 10.0000 meq | EXTENDED_RELEASE_TABLET | Freq: Every day | ORAL | Status: DC

## 2012-12-05 NOTE — Telephone Encounter (Signed)
Message copied by Lorine Bears on Thu Dec 05, 2012  2:20 PM ------      Message from: Jama Flavors P      Created: Wed Dec 04, 2012 10:38 AM       Please let her know K is better at 3.4,  still just a little low. Recommend KCl 10 mEq daily x 4 weeks then just follow/ be aware of getting in diet as she is on the HCTZ combination BP med. ------

## 2012-12-05 NOTE — Telephone Encounter (Signed)
Left a message in Ms. Gartman's vm on cell phone regarding the KCL results as noted below by Dr. Darrold Span.  Electronically sent a prescription to [facility] pharmacy.

## 2012-12-09 NOTE — Progress Notes (Signed)
Per Normajean Baxter RN, Potassium infusion infused over one hour with bag. HL

## 2012-12-11 ENCOUNTER — Other Ambulatory Visit (HOSPITAL_BASED_OUTPATIENT_CLINIC_OR_DEPARTMENT_OTHER): Payer: 59 | Admitting: Lab

## 2012-12-11 ENCOUNTER — Ambulatory Visit (HOSPITAL_BASED_OUTPATIENT_CLINIC_OR_DEPARTMENT_OTHER): Payer: 59 | Admitting: Hematology and Oncology

## 2012-12-11 VITALS — BP 129/77 | HR 93 | Temp 98.4°F | Resp 18 | Ht 63.0 in | Wt 204.7 lb

## 2012-12-11 DIAGNOSIS — C541 Malignant neoplasm of endometrium: Secondary | ICD-10-CM

## 2012-12-11 DIAGNOSIS — C549 Malignant neoplasm of corpus uteri, unspecified: Secondary | ICD-10-CM

## 2012-12-11 DIAGNOSIS — D61810 Antineoplastic chemotherapy induced pancytopenia: Secondary | ICD-10-CM

## 2012-12-11 DIAGNOSIS — T451X5A Adverse effect of antineoplastic and immunosuppressive drugs, initial encounter: Secondary | ICD-10-CM

## 2012-12-11 LAB — CBC WITH DIFFERENTIAL/PLATELET
BASO%: 1.4 % (ref 0.0–2.0)
Basophils Absolute: 0 10*3/uL (ref 0.0–0.1)
EOS%: 1.9 % (ref 0.0–7.0)
Eosinophils Absolute: 0.1 10*3/uL (ref 0.0–0.5)
HCT: 25.7 % — ABNORMAL LOW (ref 34.8–46.6)
HGB: 9.1 g/dL — ABNORMAL LOW (ref 11.6–15.9)
LYMPH%: 31.3 % (ref 14.0–49.7)
MCH: 35.5 pg — ABNORMAL HIGH (ref 25.1–34.0)
MCHC: 35.6 g/dL (ref 31.5–36.0)
MCV: 99.7 fL (ref 79.5–101.0)
MONO#: 0.3 10*3/uL (ref 0.1–0.9)
MONO%: 10 % (ref 0.0–14.0)
NEUT#: 1.7 10*3/uL (ref 1.5–6.5)
NEUT%: 55.4 % (ref 38.4–76.8)
Platelets: 159 10*3/uL (ref 145–400)
RBC: 2.57 10*6/uL — ABNORMAL LOW (ref 3.70–5.45)
RDW: 18.1 % — ABNORMAL HIGH (ref 11.2–14.5)
WBC: 3.1 10*3/uL — ABNORMAL LOW (ref 3.9–10.3)
lymph#: 1 10*3/uL (ref 0.9–3.3)

## 2012-12-11 LAB — COMPREHENSIVE METABOLIC PANEL (CC13)
ALT: 28 U/L (ref 0–55)
AST: 20 U/L (ref 5–34)
Albumin: 3.6 g/dL (ref 3.5–5.0)
Alkaline Phosphatase: 66 U/L (ref 40–150)
BUN: 15.9 mg/dL (ref 7.0–26.0)
CO2: 24 mEq/L (ref 22–29)
Calcium: 9.7 mg/dL (ref 8.4–10.4)
Chloride: 104 mEq/L (ref 98–109)
Creatinine: 0.8 mg/dL (ref 0.6–1.1)
Glucose: 111 mg/dl (ref 70–140)
Potassium: 4 mEq/L (ref 3.5–5.1)
Sodium: 138 mEq/L (ref 136–145)
Total Bilirubin: 0.32 mg/dL (ref 0.20–1.20)
Total Protein: 7.3 g/dL (ref 6.4–8.3)

## 2012-12-11 NOTE — Progress Notes (Signed)
OFFICE PROGRESS NOTE   12/11/2012   Physicians:Elizabeth Skinner, MD, Antony Blackbird MD, Sigmund Hazel MD, Gerald Leitz MD, [facility]   INTERVAL HISTORY:  Patient is seen, alone for visit, in continuing attention to adjuvant chemotherapy with taxol carboplatin for high grade IA endometrial cancer. She had cycle 5 chemotherapy on 11-22-12, with neupogen 6-27 and 11-30-12, and is neutropenic today, as well as more anemic and thrombocytopenic. Note carbo had been dose reduced to AUC = 4 with taxol at 175 mg/m2. She had HDR by Dr Roselind Messier, with last treatment (#5) given 11-27-12. She has been fatigued and SOB with exertion, but no fever and no bleeding.   Oncology History  Patient presented to PCP Sigmund Hazel with intermittent vaginal bleeding since ~ Oct 2013. Dr Hyacinth Meeker did endometrial biopsy 06-20-12 which showed complex endometrial hyperplasia with atypia. She had pelvic and transvaginal US in [place] system on 06-26-12, which showed endometrial thickening of 2.7 cm, right ovary not clearly seen and left ovary with normal postmenopausal appearance. She was seen by Dr Gerald Leitz and referred to Dr Clifton James. She had robotic hysterectomy with BSO and bilateral pelvic and periaortic node dissection by Dr Clifton James at [facility] on 07-30-2012. Intraoperatively she had large endometrial polyp with high grade malignancy, no obvious extrauterine disease. Pathology ([facility] [place] [id number] from 07-30-2012) had high grade, poorly differentiated endometrial adenocarcinoma with solid and serous papillary components, 2 periaortic nodes negative and 9 right pelvic/11 left pelvic nodes negative. The tumor was 4.8 cm confined to polyp with no definite myometrial involvement, margins clear and no angiolymphatic invasion identified.  Cycle 1 taxol carboplatin was given 08-30-12, 6 cycles planned.    Too fatigued to shop over weekend, spent all of yesterday in bed. Nausea better now, has been  trying to push fluids (water) and has been eating. Some constipation just after chemo, bowels moving well now. No bladder symptoms. No chest pain and not SOB at rest, but has dyspnea with just moderate exertion. Remainder of 10 point Review of Systems negative.  Objective:  Vital signs in last 24 hours:  BP 129/77  Pulse 93  Temp(Src) 98.4 F (36.9 C) (Oral)  Resp 18  Ht 5\' 3"  (1.6 m)  Wt 204 lb 11.2 oz (92.851 kg)  BMI 36.27 kg/m2  Weight is down 1 lb. Alert, looks tired and pale, not SOB seated in exam room.   HEENT:PERRLA, sclera clear, anicteric and oropharynx clear, no lesions Alopecia LymphaticsCervical, supraclavicular, and axillary nodes normal. Resp: clear to auscultation bilaterally and normal percussion bilaterally Cardio: regular rate and rhythm GI: soft, nontender, not distended, quiet, no HSM or mass, surgical incision not remarkable Extremities: extremities normal, atraumatic, no cyanosis or edema Neuro:no sensory deficits noted Skin pale, not icteric, no petechiae or rash, no ecchymoses  Lab Results:  Results for orders placed in visit on 12/11/12  CBC WITH DIFFERENTIAL      Result Value Range   WBC 3.1 (*) 3.9 - 10.3 10e3/uL   NEUT# 1.7  1.5 - 6.5 10e3/uL   HGB 9.1 (*) 11.6 - 15.9 g/dL   HCT 45.4 (*) 09.8 - 11.9 %   Platelets 159  145 - 400 10e3/uL   MCV 99.7  79.5 - 101.0 fL   MCH 35.5 (*) 25.1 - 34.0 pg   MCHC 35.6  31.5 - 36.0 g/dL   RBC 1.47 (*) 8.29 - 5.62 10e6/uL   RDW 18.1 (*) 11.2 - 14.5 %   lymph# 1.0  0.9 -  3.3 10e3/uL   MONO# 0.3  0.1 - 0.9 10e3/uL   Eosinophils Absolute 0.1  0.0 - 0.5 10e3/uL   Basophils Absolute 0.0  0.0 - 0.1 10e3/uL   NEUT% 55.4  38.4 - 76.8 %   LYMPH% 31.3  14.0 - 49.7 %   MONO% 10.0  0.0 - 14.0 %   EOS% 1.9  0.0 - 7.0 %   BASO% 1.4  0.0 - 2.0 %  COMPREHENSIVE METABOLIC PANEL (CC13)      Result Value Range   Sodium 138  136 - 145 mEq/L   Potassium 4.0  3.5 - 5.1 mEq/L   Chloride 104  98 - 109 mEq/L   CO2 24  22  - 29 mEq/L   Glucose 111  70 - 140 mg/dl   BUN 16.1  7.0 - 09.6 mg/dL   Creatinine 0.8  0.6 - 1.1 mg/dL   Total Bilirubin 0.45  0.20 - 1.20 mg/dL   Alkaline Phosphatase 66  40 - 150 U/L   AST 20  5 - 34 U/L   ALT 28  0 - 55 U/L   Total Protein 7.3  6.4 - 8.3 g/dL   Albumin 3.6  3.5 - 5.0 g/dL   Calcium 9.7  8.4 - 40.9 mg/dL     Studies/Results:  No results found.  Medications: I have reviewed the patient's current medications. She will have neupogen today and 7-1, then also 7-2 depending on CBC that day.  She will have IV and po potassium at [facility] now and will repeat CMET also on 12-04-12. She will use cipro 250 mg bid while neutropenic.  Patient has been instructed in neutropenic precautions. She should not be at work until [facility] >1.0.  Assessment/Plan: 1. Pancytopenia day 11 cycle 5 taxol/ carboplatin: patient agrees to one unit PRBCs today for symptomatic anemia (she declined 2 units). She continues neupogen as above and neutropenic precautions have been reviewed, prophylactic cipro until ANC >1.0.  Will repeat CBC 12-04-12 and with MD appointment 1-2 days prior to cycle 6 chemo due 12-13-12. 2. IA high grade endometrial carcinoma involving endometrial polyp: history as above. HDR completed6-25-14 with follow up appointment to Dr Roselind Messier 01-02-13. Plan total 6 cycles taxol carboplatin, her cycle 6 is due to be given  On 7-11, and she is here today to make sure her count is adequate, her ANC is 1.7 and her plts 159K, thus will plan for her to get her cycle 6 as scheduled. She will need to be followed by medical oncology at least until she has completely recovered from chemotherapy, and from there as requested by gyn oncology and rad onc. She should have CT AP prior to return visit to Dr Clifton James ~ a month after completion of chemo. 3.overdue mammograms, which she wants done after chemo completes 4. Hx HTN: she holds BP meds after chemo as needed 5.family history melanoma 6. Previous obesity,  intentional weight loss prior to this diagnosis     Zachery Dakins, MD   12/11/2012, 10:15 AM

## 2012-12-13 ENCOUNTER — Other Ambulatory Visit (HOSPITAL_BASED_OUTPATIENT_CLINIC_OR_DEPARTMENT_OTHER): Payer: 59 | Admitting: Lab

## 2012-12-13 ENCOUNTER — Telehealth: Payer: Self-pay | Admitting: Oncology

## 2012-12-13 ENCOUNTER — Other Ambulatory Visit: Payer: Self-pay | Admitting: Oncology

## 2012-12-13 ENCOUNTER — Other Ambulatory Visit: Payer: Self-pay | Admitting: Hematology and Oncology

## 2012-12-13 ENCOUNTER — Other Ambulatory Visit: Payer: Self-pay | Admitting: *Deleted

## 2012-12-13 ENCOUNTER — Ambulatory Visit (HOSPITAL_BASED_OUTPATIENT_CLINIC_OR_DEPARTMENT_OTHER): Payer: 59

## 2012-12-13 VITALS — BP 142/79 | HR 105 | Temp 98.6°F | Resp 20

## 2012-12-13 DIAGNOSIS — C541 Malignant neoplasm of endometrium: Secondary | ICD-10-CM

## 2012-12-13 DIAGNOSIS — C549 Malignant neoplasm of corpus uteri, unspecified: Secondary | ICD-10-CM

## 2012-12-13 DIAGNOSIS — Z5111 Encounter for antineoplastic chemotherapy: Secondary | ICD-10-CM

## 2012-12-13 LAB — BASIC METABOLIC PANEL (CC13)
BUN: 21.2 mg/dL (ref 7.0–26.0)
CO2: 21 mEq/L — ABNORMAL LOW (ref 22–29)
Calcium: 10.2 mg/dL (ref 8.4–10.4)
Chloride: 104 mEq/L (ref 98–109)
Creatinine: 0.8 mg/dL (ref 0.6–1.1)
Glucose: 202 mg/dl — ABNORMAL HIGH (ref 70–140)
Potassium: 4.1 mEq/L (ref 3.5–5.1)
Sodium: 137 mEq/L (ref 136–145)

## 2012-12-13 LAB — CBC WITH DIFFERENTIAL/PLATELET
BASO%: 0.3 % (ref 0.0–2.0)
Basophils Absolute: 0 10*3/uL (ref 0.0–0.1)
EOS%: 0 % (ref 0.0–7.0)
Eosinophils Absolute: 0 10*3/uL (ref 0.0–0.5)
HCT: 28.7 % — ABNORMAL LOW (ref 34.8–46.6)
HGB: 9.5 g/dL — ABNORMAL LOW (ref 11.6–15.9)
LYMPH%: 11.4 % — ABNORMAL LOW (ref 14.0–49.7)
MCH: 33 pg (ref 25.1–34.0)
MCHC: 33.1 g/dL (ref 31.5–36.0)
MCV: 99.7 fL (ref 79.5–101.0)
MONO#: 0 10*3/uL — ABNORMAL LOW (ref 0.1–0.9)
MONO%: 1.2 % (ref 0.0–14.0)
NEUT#: 2.8 10*3/uL (ref 1.5–6.5)
NEUT%: 87.1 % — ABNORMAL HIGH (ref 38.4–76.8)
Platelets: 192 10*3/uL (ref 145–400)
RBC: 2.88 10*6/uL — ABNORMAL LOW (ref 3.70–5.45)
RDW: 16.4 % — ABNORMAL HIGH (ref 11.2–14.5)
WBC: 3.3 10*3/uL — ABNORMAL LOW (ref 3.9–10.3)
lymph#: 0.4 10*3/uL — ABNORMAL LOW (ref 0.9–3.3)

## 2012-12-13 MED ORDER — ONDANSETRON 16 MG/50ML IVPB (CHCC)
16.0000 mg | Freq: Once | INTRAVENOUS | Status: AC
  Administered 2012-12-13: 16 mg via INTRAVENOUS

## 2012-12-13 MED ORDER — PACLITAXEL CHEMO INJECTION 300 MG/50ML
175.0000 mg/m2 | Freq: Once | INTRAVENOUS | Status: AC
  Administered 2012-12-13: 348 mg via INTRAVENOUS
  Filled 2012-12-13: qty 58

## 2012-12-13 MED ORDER — DIPHENHYDRAMINE HCL 50 MG/ML IJ SOLN
50.0000 mg | Freq: Once | INTRAMUSCULAR | Status: AC
  Administered 2012-12-13: 50 mg via INTRAVENOUS

## 2012-12-13 MED ORDER — DEXAMETHASONE SODIUM PHOSPHATE 20 MG/5ML IJ SOLN
12.0000 mg | Freq: Once | INTRAMUSCULAR | Status: AC
  Administered 2012-12-13: 12 mg via INTRAVENOUS

## 2012-12-13 MED ORDER — CARBOPLATIN CHEMO INJECTION 600 MG/60ML
630.0000 mg | Freq: Once | INTRAVENOUS | Status: AC
  Administered 2012-12-13: 630 mg via INTRAVENOUS
  Filled 2012-12-13: qty 63

## 2012-12-13 MED ORDER — SODIUM CHLORIDE 0.9 % IV SOLN
Freq: Once | INTRAVENOUS | Status: AC
  Administered 2012-12-13: 10:00:00 via INTRAVENOUS

## 2012-12-13 MED ORDER — DEXTROSE 5 % IV SOLN
50.0000 mg | Freq: Once | INTRAVENOUS | Status: AC
  Administered 2012-12-13: 50 mg via INTRAVENOUS
  Filled 2012-12-13: qty 2

## 2012-12-13 MED ORDER — FAMOTIDINE IN NACL 20-0.9 MG/50ML-% IV SOLN: 20.0000 mg | Freq: Once | INTRAVENOUS | Status: DC

## 2012-12-13 MED ORDER — SODIUM CHLORIDE 0.9 % IV SOLN
150.0000 mg | Freq: Once | INTRAVENOUS | Status: AC
  Administered 2012-12-13: 150 mg via INTRAVENOUS
  Filled 2012-12-13: qty 5

## 2012-12-13 NOTE — Patient Instructions (Signed)
Cancer Center Discharge Instructions for Patients Receiving Chemotherapy  Today you received the following chemotherapy agents :  Taxol, Carboplatin.  To help prevent nausea and vomiting after your treatment, we encourage you to take your nausea medication as instructed by your physician.   If you develop nausea and vomiting that is not controlled by your nausea medication, call the clinic.   BELOW ARE SYMPTOMS THAT SHOULD BE REPORTED IMMEDIATELY:  *FEVER GREATER THAN 100.5 F  *CHILLS WITH OR WITHOUT FEVER  NAUSEA AND VOMITING THAT IS NOT CONTROLLED WITH YOUR NAUSEA MEDICATION  *UNUSUAL SHORTNESS OF BREATH  *UNUSUAL BRUISING OR BLEEDING  TENDERNESS IN MOUTH AND THROAT WITH OR WITHOUT PRESENCE OF ULCERS  *URINARY PROBLEMS  *BOWEL PROBLEMS  UNUSUAL RASH Items with * indicate a potential emergency and should be followed up as soon as possible.  Feel free to call the clinic you have any questions or concerns. The clinic phone number is [phone].    

## 2012-12-13 NOTE — Telephone Encounter (Signed)
Called patient left message no injection for tomorrow as this is her last cycle per Dr. Karel Jarvis.

## 2012-12-14 ENCOUNTER — Ambulatory Visit: Payer: 59

## 2012-12-16 ENCOUNTER — Telehealth: Payer: Self-pay

## 2012-12-16 ENCOUNTER — Ambulatory Visit: Payer: 59

## 2012-12-16 NOTE — Telephone Encounter (Signed)
LM for Diane Lozano and told her that it is fine that she cancelled appt. for neupogen injections.  She can discuss receiving them at visit 12-18-12 with Dr. Karel Jarvis as her counts will come down more towards this weekend and next week.

## 2012-12-17 ENCOUNTER — Ambulatory Visit: Payer: 59

## 2012-12-18 ENCOUNTER — Ambulatory Visit (HOSPITAL_BASED_OUTPATIENT_CLINIC_OR_DEPARTMENT_OTHER): Payer: 59 | Admitting: Hematology and Oncology

## 2012-12-18 ENCOUNTER — Ambulatory Visit: Payer: 59 | Admitting: Lab

## 2012-12-18 ENCOUNTER — Telehealth: Payer: Self-pay | Admitting: Hematology and Oncology

## 2012-12-18 ENCOUNTER — Other Ambulatory Visit (HOSPITAL_BASED_OUTPATIENT_CLINIC_OR_DEPARTMENT_OTHER): Payer: 59 | Admitting: Lab

## 2012-12-18 ENCOUNTER — Ambulatory Visit (HOSPITAL_BASED_OUTPATIENT_CLINIC_OR_DEPARTMENT_OTHER): Payer: 59

## 2012-12-18 VITALS — BP 143/89 | HR 134 | Temp 96.7°F | Resp 18 | Ht 63.0 in | Wt 195.7 lb

## 2012-12-18 DIAGNOSIS — R110 Nausea: Secondary | ICD-10-CM

## 2012-12-18 DIAGNOSIS — R5381 Other malaise: Secondary | ICD-10-CM

## 2012-12-18 DIAGNOSIS — C549 Malignant neoplasm of corpus uteri, unspecified: Secondary | ICD-10-CM

## 2012-12-18 DIAGNOSIS — C541 Malignant neoplasm of endometrium: Secondary | ICD-10-CM

## 2012-12-18 DIAGNOSIS — D709 Neutropenia, unspecified: Secondary | ICD-10-CM

## 2012-12-18 LAB — CBC WITH DIFFERENTIAL/PLATELET
BASO%: 0.8 % (ref 0.0–2.0)
Basophils Absolute: 0 10*3/uL (ref 0.0–0.1)
EOS%: 0.8 % (ref 0.0–7.0)
Eosinophils Absolute: 0 10*3/uL (ref 0.0–0.5)
HCT: 30.2 % — ABNORMAL LOW (ref 34.8–46.6)
HGB: 10.6 g/dL — ABNORMAL LOW (ref 11.6–15.9)
LYMPH%: 46.4 % (ref 14.0–49.7)
MCH: 33.8 pg (ref 25.1–34.0)
MCHC: 35.1 g/dL (ref 31.5–36.0)
MCV: 96.2 fL (ref 79.5–101.0)
MONO#: 0.1 10*3/uL (ref 0.1–0.9)
MONO%: 3 % (ref 0.0–14.0)
NEUT#: 1.2 10*3/uL — ABNORMAL LOW (ref 1.5–6.5)
NEUT%: 49 % (ref 38.4–76.8)
Platelets: 249 10*3/uL (ref 145–400)
RBC: 3.14 10*6/uL — ABNORMAL LOW (ref 3.70–5.45)
RDW: 14.8 % — ABNORMAL HIGH (ref 11.2–14.5)
WBC: 2.4 10*3/uL — ABNORMAL LOW (ref 3.9–10.3)
lymph#: 1.1 10*3/uL (ref 0.9–3.3)
nRBC: 0 % (ref 0–0)

## 2012-12-18 LAB — COMPREHENSIVE METABOLIC PANEL (CC13)
ALT: 42 U/L (ref 0–55)
AST: 30 U/L (ref 5–34)
Albumin: 3.9 g/dL (ref 3.5–5.0)
Alkaline Phosphatase: 57 U/L (ref 40–150)
BUN: 19.2 mg/dL (ref 7.0–26.0)
CO2: 25 mEq/L (ref 22–29)
Calcium: 9.7 mg/dL (ref 8.4–10.4)
Chloride: 100 mEq/L (ref 98–109)
Creatinine: 0.8 mg/dL (ref 0.6–1.1)
Glucose: 88 mg/dl (ref 70–140)
Potassium: 3.7 mEq/L (ref 3.5–5.1)
Sodium: 136 mEq/L (ref 136–145)
Total Bilirubin: 0.67 mg/dL (ref 0.20–1.20)
Total Protein: 7.6 g/dL (ref 6.4–8.3)

## 2012-12-18 MED ORDER — ONDANSETRON 8 MG/50ML IVPB (CHCC)
8.0000 mg | Freq: Once | INTRAVENOUS | Status: AC
  Administered 2012-12-18: 8 mg via INTRAVENOUS

## 2012-12-18 MED ORDER — SODIUM CHLORIDE 0.9 % IV SOLN
INTRAVENOUS | Status: AC
  Administered 2012-12-18: 10:00:00 via INTRAVENOUS

## 2012-12-18 NOTE — Patient Instructions (Addendum)
Dehydration, Adult Dehydration is when you lose more fluids from the body than you take in. Vital organs like the kidneys, brain, and heart cannot function without a proper amount of fluids and salt. Any loss of fluids from the body can cause dehydration.  CAUSES   Vomiting.  Diarrhea.  Excessive sweating.  Excessive urine output.  Fever. SYMPTOMS  Mild dehydration  Thirst.  Dry lips.  Slightly dry mouth. Moderate dehydration  Very dry mouth.  Sunken eyes.  Skin does not bounce back quickly when lightly pinched and released.  Dark urine and decreased urine production.  Decreased tear production.  Headache. Severe dehydration  Very dry mouth.  Extreme thirst.  Rapid, weak pulse (more than 100 beats per minute at rest).  Cold hands and feet.  Not able to sweat in spite of heat and temperature.  Rapid breathing.  Blue lips.  Confusion and lethargy.  Difficulty being awakened.  Minimal urine production.  No tears. DIAGNOSIS  Your caregiver will diagnose dehydration based on your symptoms and your exam. Blood and urine tests will help confirm the diagnosis. The diagnostic evaluation should also identify the cause of dehydration. TREATMENT  Treatment of mild or moderate dehydration can often be done at home by increasing the amount of fluids that you drink. It is best to drink small amounts of fluid more often. Drinking too much at one time can make vomiting worse. Refer to the home care instructions below. Severe dehydration needs to be treated at the hospital where you will probably be given intravenous (IV) fluids that contain water and electrolytes. HOME CARE INSTRUCTIONS   Ask your caregiver about specific rehydration instructions.  Drink enough fluids to keep your urine clear or pale yellow.  Drink small amounts frequently if you have nausea and vomiting.  Eat as you normally do.  Avoid:  Foods or drinks high in sugar.  Carbonated  drinks.  Juice.  Extremely hot or cold fluids.  Drinks with caffeine.  Fatty, greasy foods.  Alcohol.  Tobacco.  Overeating.  Gelatin desserts.  Wash your hands well to avoid spreading bacteria and viruses.  Only take over-the-counter or prescription medicines for pain, discomfort, or fever as directed by your caregiver.  Ask your caregiver if you should continue all prescribed and over-the-counter medicines.  Keep all follow-up appointments with your caregiver. SEEK MEDICAL CARE IF:  You have abdominal pain and it increases or stays in one area (localizes).  You have a rash, stiff neck, or severe headache.  You are irritable, sleepy, or difficult to awaken.  You are weak, dizzy, or extremely thirsty. SEEK IMMEDIATE MEDICAL CARE IF:   You are unable to keep fluids down or you get worse despite treatment.  You have frequent episodes of vomiting or diarrhea.  You have blood or green matter (bile) in your vomit.  You have blood in your stool or your stool looks black and tarry.  You have not urinated in 6 to 8 hours, or you have only urinated a small amount of very dark urine.  You have a fever.  You faint. MAKE SURE YOU:   Understand these instructions.  Will watch your condition.  Will get help right away if you are not doing well or get worse. Document Released: 05/22/2005 Document Revised: 08/14/2011 Document Reviewed: 01/09/2011 ExitCare Patient Information 2014 [employer].  

## 2012-12-18 NOTE — Telephone Encounter (Signed)
Gave pt appt for lab before 7/23 and MD on 01/08/13

## 2012-12-18 NOTE — Progress Notes (Signed)
OFFICE PROGRESS NOTE   12/18/2012   Physicians:Elizabeth Skinner, MD, Antony Blackbird MD, Sigmund Hazel MD, Gerald Leitz MD, [facility]   INTERVAL HISTORY:  Patient is seen, alone for visit, in continuing attention to adjuvant chemotherapy with taxol carboplatin for high grade IA endometrial cancer. She had cycle 5 chemotherapy on 11-22-12, with neupogen 6-27 and 11-30-12, and is neutropenic today, as well as more anemic and thrombocytopenic. Note carbo had been dose reduced to AUC = 4 with taxol at 175 mg/m2. She had HDR by Dr Roselind Messier, with last treatment (#5) given 11-27-12. She has been fatigued and SOB with exertion, but no fever and no bleeding.   Oncology History  Patient presented to PCP Sigmund Hazel with intermittent vaginal bleeding since ~ Oct 2013. Dr Hyacinth Meeker did endometrial biopsy 06-20-12 which showed complex endometrial hyperplasia with atypia. She had pelvic and transvaginal US in [facility] system on 06-26-12, which showed endometrial thickening of 2.7 cm, right ovary not clearly seen and left ovary with normal postmenopausal appearance. She was seen by Dr Gerald Leitz and referred to Dr Clifton James. She had robotic hysterectomy with BSO and bilateral pelvic and periaortic node dissection by Dr Clifton James at [facility] on 07-30-2012. Intraoperatively she had large endometrial polyp with high grade malignancy, no obvious extrauterine disease. Pathology ([facility] [place] [id number] from 07-30-2012) had high grade, poorly differentiated endometrial adenocarcinoma with solid and serous papillary components, 2 periaortic nodes negative and 9 right pelvic/11 left pelvic nodes negative. The tumor was 4.8 cm confined to polyp with no definite myometrial involvement, margins clear and no angiolymphatic invasion identified.  Cycle 1 taxol carboplatin was given 08-30-12, 6 cycles planned.    Complained of fatigue, spent all of yesterday in bed. Nausea, has been trying to push fluids  (water) and but has not been eating.  No bladder symptoms. No chest pain and not SOB at rest, but has dyspnea with just moderate exertion. Remainder of 10 point Review of Systems negative.  Objective:  Vital signs in last 24 hours:  BP 143/89  Pulse 134  Temp(Src) 96.7 F (35.9 C) (Oral)  Resp 18  Ht 5\' 3"  (1.6 m)  Wt 195 lb 11.2 oz (88.769 kg)  BMI 34.68 kg/m2  Weight is down 1 lb. Alert, looks tired and pale, not SOB seated in exam room.   HEENT:PERRLA, sclera clear, anicteric and oropharynx clear, no lesions Alopecia LymphaticsCervical, supraclavicular, and axillary nodes normal. Resp: clear to auscultation bilaterally and normal percussion bilaterally Cardio: regular rate and rhythm GI: soft, nontender, not distended, quiet, no HSM or mass, surgical incision not remarkable Extremities: extremities normal, atraumatic, no cyanosis or edema Neuro:no sensory deficits noted Skin pale, not icteric, no petechiae or rash, no ecchymoses  Lab Results:  Results for orders placed in visit on 12/18/12  CBC WITH DIFFERENTIAL      Result Value Range   WBC 2.4 (*) 3.9 - 10.3 10e3/uL   NEUT# 1.2 (*) 1.5 - 6.5 10e3/uL   HGB 10.6 (*) 11.6 - 15.9 g/dL   HCT 45.4 (*) 09.8 - 11.9 %   Platelets 249  145 - 400 10e3/uL   MCV 96.2  79.5 - 101.0 fL   MCH 33.8  25.1 - 34.0 pg   MCHC 35.1  31.5 - 36.0 g/dL   RBC 1.47 (*) 8.29 - 5.62 10e6/uL   RDW 14.8 (*) 11.2 - 14.5 %   lymph# 1.1  0.9 - 3.3 10e3/uL   MONO# 0.1  0.1 - 0.9  10e3/uL   Eosinophils Absolute 0.0  0.0 - 0.5 10e3/uL   Basophils Absolute 0.0  0.0 - 0.1 10e3/uL   NEUT% 49.0  38.4 - 76.8 %   LYMPH% 46.4  14.0 - 49.7 %   MONO% 3.0  0.0 - 14.0 %   EOS% 0.8  0.0 - 7.0 %   BASO% 0.8  0.0 - 2.0 %   nRBC 0  0 - 0 %     Studies/Results:  No results found.  Medications: I have reviewed the patient's current medications. She will have neupogen today and 7-1, then also 7-2 depending on CBC that day.  She will have IV and po potassium at  [facility] now and will repeat CMET also on 12-04-12. She will use cipro 250 mg bid while neutropenic.  Patient has been instructed in neutropenic precautions. She should not be at work until [facility] >1.0.  Assessment/Plan: 1. IA high grade endometrial carcinoma involving endometrial polyp: history as above. HDR completed 11-27-12 with follow up appointment to Dr Roselind Messier 01-02-13. Total planned 6 cycles taxol carboplatin, her cycle 6 was given on 7-11, and she is here today to f/u after treatment, complaining of fatigue, nausea. Will give IV fluid today, IV zofran and check her BMP Lab next week. She will RTC in 3 weeks for reassesment but she know to call us if not getting better.  She will need to be followed by medical oncology at least until she has completely recovered from chemotherapy, and from there as requested by gyn oncology and rad onc. She should have CT AP prior to return visit to Dr Clifton James ~ a month after completion of chemo. 3.overdue mammograms, which she wants done after chemo completes 4. Hx HTN: she holds BP meds after chemo as needed 5.family history melanoma 6. Previous obesity, intentional weight loss prior to this diagnosis     Zachery Dakins, MD   12/18/2012, 9:34 AM

## 2012-12-25 ENCOUNTER — Other Ambulatory Visit: Payer: Self-pay

## 2012-12-25 ENCOUNTER — Telehealth: Payer: Self-pay

## 2012-12-25 ENCOUNTER — Other Ambulatory Visit (HOSPITAL_BASED_OUTPATIENT_CLINIC_OR_DEPARTMENT_OTHER): Payer: 59

## 2012-12-25 DIAGNOSIS — C549 Malignant neoplasm of corpus uteri, unspecified: Secondary | ICD-10-CM

## 2012-12-25 DIAGNOSIS — C541 Malignant neoplasm of endometrium: Secondary | ICD-10-CM

## 2012-12-25 LAB — CBC WITH DIFFERENTIAL/PLATELET
BASO%: 2.1 % — ABNORMAL HIGH (ref 0.0–2.0)
Basophils Absolute: 0 10*3/uL (ref 0.0–0.1)
EOS%: 1.2 % (ref 0.0–7.0)
Eosinophils Absolute: 0 10*3/uL (ref 0.0–0.5)
HCT: 24.9 % — ABNORMAL LOW (ref 34.8–46.6)
HGB: 8.8 g/dL — ABNORMAL LOW (ref 11.6–15.9)
LYMPH%: 52.3 % — ABNORMAL HIGH (ref 14.0–49.7)
MCH: 35.2 pg — ABNORMAL HIGH (ref 25.1–34.0)
MCHC: 35.4 g/dL (ref 31.5–36.0)
MCV: 99.5 fL (ref 79.5–101.0)
MONO#: 0.4 10*3/uL (ref 0.1–0.9)
MONO%: 21.3 % — ABNORMAL HIGH (ref 0.0–14.0)
NEUT#: 0.5 10*3/uL — CL (ref 1.5–6.5)
NEUT%: 23.1 % — ABNORMAL LOW (ref 38.4–76.8)
Platelets: 160 10*3/uL (ref 145–400)
RBC: 2.5 10*6/uL — ABNORMAL LOW (ref 3.70–5.45)
RDW: 16.8 % — ABNORMAL HIGH (ref 11.2–14.5)
WBC: 2.1 10*3/uL — ABNORMAL LOW (ref 3.9–10.3)
lymph#: 1.1 10*3/uL (ref 0.9–3.3)

## 2012-12-25 LAB — COMPREHENSIVE METABOLIC PANEL (CC13)
ALT: 26 U/L (ref 0–55)
AST: 21 U/L (ref 5–34)
Albumin: 3.9 g/dL (ref 3.5–5.0)
Alkaline Phosphatase: 64 U/L (ref 40–150)
BUN: 13.1 mg/dL (ref 7.0–26.0)
CO2: 26 mEq/L (ref 22–29)
Calcium: 9.8 mg/dL (ref 8.4–10.4)
Chloride: 101 mEq/L (ref 98–109)
Creatinine: 0.8 mg/dL (ref 0.6–1.1)
Glucose: 102 mg/dl (ref 70–140)
Potassium: 3.6 mEq/L (ref 3.5–5.1)
Sodium: 137 mEq/L (ref 136–145)
Total Bilirubin: 0.4 mg/dL (ref 0.20–1.20)
Total Protein: 7.5 g/dL (ref 6.4–8.3)

## 2012-12-25 NOTE — Telephone Encounter (Signed)
Diane Lozano called back and stated that she was afebrile and feeling fine.

## 2012-12-25 NOTE — Telephone Encounter (Signed)
Left a message for Diane Lozano stating that her ANC is 0.5 and she is neutropenic. She is ~ 2 weeks form her cycle 6 taxol/carboplatin treatment.   Requested that she call if temp 100.5 or higher and or if she has symptoms of infection-respiratory or urinary. She is for follow up on 01-08-13.

## 2012-12-30 ENCOUNTER — Encounter: Payer: Self-pay | Admitting: Radiation Oncology

## 2012-12-30 DIAGNOSIS — Z923 Personal history of irradiation: Secondary | ICD-10-CM | POA: Insufficient documentation

## 2012-12-30 DIAGNOSIS — C541 Malignant neoplasm of endometrium: Secondary | ICD-10-CM | POA: Insufficient documentation

## 2013-01-02 ENCOUNTER — Ambulatory Visit
Admission: RE | Admit: 2013-01-02 | Discharge: 2013-01-02 | Disposition: A | Discharge status: Home / Self Care | Payer: 59 | Source: Ambulatory Visit | Attending: Radiation Oncology | Admitting: Radiation Oncology

## 2013-01-02 ENCOUNTER — Encounter: Payer: Self-pay | Admitting: Radiation Oncology

## 2013-01-02 VITALS — BP 129/83 | HR 102 | Temp 97.9°F | Resp 20 | Wt 204.8 lb

## 2013-01-02 DIAGNOSIS — C541 Malignant neoplasm of endometrium: Secondary | ICD-10-CM

## 2013-01-02 NOTE — Progress Notes (Signed)
Radiation Oncology         [phone]) [phone] ________________________________  Name: Diane Lozano MRN: [id number]  Date: 01/02/2013  DOB: 06/03/1956  Follow-Up Visit Note  CC: Neldon Labella, MD  Sigmund Hazel, MD  Diagnosis:  Stage I-A high grade endometrial carcinoma involving an endometrial polyp   Interval Since Last Radiation:  36  days, she completed intracavitary brachytherapy treatments   Narrative:  The patient returns today for routine follow-up.  She is doing well and without complaints. 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 She denies any urination difficulties or bowel complaints. She denies any vaginal bleeding.                              ALLERGIES:  has No Known Allergies.  Meds: Current Outpatient Prescriptions  Medication Sig Dispense Refill  . aspirin 81 MG tablet Take 81 mg by mouth daily.      . Calcium Carb-Cholecalciferol 500-600 MG-UNIT TABS Take 1 tablet by mouth daily.      . ferrous gluconate (FERGON) 324 MG tablet Take 324 mg by mouth daily with breakfast.      . lisinopril-hydrochlorothiazide (PRINZIDE,ZESTORETIC) 20-12.5 MG per tablet Take 1 tablet by mouth daily.      . pantoprazole (PROTONIX) 40 MG tablet Take 1 tablet (40 mg total) by mouth daily. For acid reflux  30 tablet  3  . potassium chloride (K-DUR) 10 MEQ tablet Take 1 tablet (10 mEq total) by mouth daily.  30 tablet  0  . simvastatin (ZOCOR) 40 MG tablet Take 40 mg by mouth every evening.      . venlafaxine XR (EFFEXOR-XR) 75 MG 24 hr capsule Take 75 mg by mouth daily.      Marland Kitchen HYDROcodone-acetaminophen (NORCO) 5-325 MG per tablet Take 1 tablet by mouth every 6 (six) hours as needed for pain.  30 tablet  0  . LORazepam (ATIVAN) 1 MG tablet Take 1/2 to 1 tab every 4-6 hours under the tongue or swallow as needed for nausea.  Will make you drowsy; forgetful  20 tablet  0  . ondansetron (ZOFRAN) 8 MG tablet Take 1-2 tablets (8-16 mg total) by mouth every 12 (twelve) hours as  needed for nausea (Will not make you drowsy).  30 tablet  1   No current facility-administered medications for this encounter.    Physical Findings: The patient is in no acute distress. Patient is alert and oriented.  weight is 204 lb 12.8 oz (92.897 kg). Her oral temperature is 97.9 F (36.6 C). Her blood pressure is 129/83 and her pulse is 102. Her respiration is 20. Marland Kitchen No palpable supraclavicular adenopathy. The lungs are clear to auscultation. The heart has a regular rhythm and rate. A pelvic exam and Pap smear is not performed in light of her recent completion of treatment  Lab Findings: Lab Results  Component Value Date   WBC 2.1* 12/25/2012   HGB 8.8* 12/25/2012   HCT 24.9* 12/25/2012   MCV 99.5 12/25/2012   PLT 160 12/25/2012      Radiographic Findings: No results found.  Impression:  The patient is doing well without any side effects from her treatment  Plan:  Followup in November for pelvic exam and Pap smear. The patient will be seen by gynecologic oncology in late August.  Today the patient was given a vaginal dilator and instructions on its use in light of her vaginal vault radiation therapy.  _____________________________________  -----------------------------------  Billie Lade, PhD, MD

## 2013-01-02 NOTE — Progress Notes (Signed)
Dilator medium given to patient with 2 tubes surgilube, with instructions to use dilator at bedtime for 10-15 minutes on Mondays,Wednesdays and Fridays, all questions answered  11:18 AM

## 2013-01-02 NOTE — Progress Notes (Signed)
Follow up  Rad txs  enodmetrial 10/29/12; 11/07/12; 11/13/12, 11/21/12;,11/27/12 No c/o dysuria,hematuria, no discharge, regular bowels, no nausea, chemo completed, alert, normal appetite, energy level is okay, getting better no pain 10:45 AM

## 2013-01-02 NOTE — Addendum Note (Signed)
Encounter addended by: Lowella Petties, RN on: 01/02/2013 11:18 AM<BR>     Documentation filed: Notes Section

## 2013-01-08 ENCOUNTER — Other Ambulatory Visit (HOSPITAL_BASED_OUTPATIENT_CLINIC_OR_DEPARTMENT_OTHER): Payer: 59 | Admitting: Lab

## 2013-01-08 ENCOUNTER — Telehealth: Payer: Self-pay | Admitting: Hematology and Oncology

## 2013-01-08 ENCOUNTER — Ambulatory Visit (HOSPITAL_BASED_OUTPATIENT_CLINIC_OR_DEPARTMENT_OTHER): Payer: 59 | Admitting: Hematology and Oncology

## 2013-01-08 VITALS — BP 131/73 | HR 96 | Temp 97.5°F | Resp 18 | Ht 63.0 in | Wt 204.5 lb

## 2013-01-08 DIAGNOSIS — C541 Malignant neoplasm of endometrium: Secondary | ICD-10-CM

## 2013-01-08 DIAGNOSIS — C549 Malignant neoplasm of corpus uteri, unspecified: Secondary | ICD-10-CM

## 2013-01-08 LAB — CBC WITH DIFFERENTIAL/PLATELET
BASO%: 1 % (ref 0.0–2.0)
Basophils Absolute: 0 10*3/uL (ref 0.0–0.1)
EOS%: 5.6 % (ref 0.0–7.0)
Eosinophils Absolute: 0.2 10*3/uL (ref 0.0–0.5)
HCT: 25.9 % — ABNORMAL LOW (ref 34.8–46.6)
HGB: 9 g/dL — ABNORMAL LOW (ref 11.6–15.9)
LYMPH%: 26.1 % (ref 14.0–49.7)
MCH: 35.3 pg — ABNORMAL HIGH (ref 25.1–34.0)
MCHC: 34.8 g/dL (ref 31.5–36.0)
MCV: 101.4 fL — ABNORMAL HIGH (ref 79.5–101.0)
MONO#: 0.3 10*3/uL (ref 0.1–0.9)
MONO%: 8.2 % (ref 0.0–14.0)
NEUT#: 2.5 10*3/uL (ref 1.5–6.5)
NEUT%: 59.1 % (ref 38.4–76.8)
Platelets: 149 10*3/uL (ref 145–400)
RBC: 2.56 10*6/uL — ABNORMAL LOW (ref 3.70–5.45)
RDW: 17.6 % — ABNORMAL HIGH (ref 11.2–14.5)
WBC: 4.2 10*3/uL (ref 3.9–10.3)
lymph#: 1.1 10*3/uL (ref 0.9–3.3)

## 2013-01-08 LAB — COMPREHENSIVE METABOLIC PANEL (CC13)
ALT: 35 U/L (ref 0–55)
AST: 24 U/L (ref 5–34)
Albumin: 3.7 g/dL (ref 3.5–5.0)
Alkaline Phosphatase: 63 U/L (ref 40–150)
BUN: 19.5 mg/dL (ref 7.0–26.0)
CO2: 25 mEq/L (ref 22–29)
Calcium: 9.6 mg/dL (ref 8.4–10.4)
Chloride: 105 mEq/L (ref 98–109)
Creatinine: 0.8 mg/dL (ref 0.6–1.1)
Glucose: 106 mg/dl (ref 70–140)
Potassium: 3.8 mEq/L (ref 3.5–5.1)
Sodium: 140 mEq/L (ref 136–145)
Total Bilirubin: 0.35 mg/dL (ref 0.20–1.20)
Total Protein: 7.1 g/dL (ref 6.4–8.3)

## 2013-01-08 NOTE — Progress Notes (Signed)
OFFICE PROGRESS NOTE   01/08/2013   Physicians:Elizabeth Skinner, MD, Antony Blackbird MD, Sigmund Hazel MD, Gerald Leitz MD, [facility]   INTERVAL HISTORY:  Patient is seen, alone for visit, in continuing attention to adjuvant chemotherapy with taxol carboplatin for high grade IA endometrial cancer. She had cycle 5 chemotherapy on 11-22-12, with neupogen 6-27 and 11-30-12, and is neutropenic today, as well as more anemic and thrombocytopenic. Note carbo had been dose reduced to AUC = 4 with taxol at 175 mg/m2. She had HDR by Dr Roselind Messier, with last treatment (#5) given 11-27-12. She has been fatigued and SOB with exertion, but no fever and no bleeding.   Oncology History  Patient presented to PCP Sigmund Hazel with intermittent vaginal bleeding since ~ Oct 2013. Dr Hyacinth Meeker did endometrial biopsy 06-20-12 which showed complex endometrial hyperplasia with atypia. She had pelvic and transvaginal US in [facility] system on 06-26-12, which showed endometrial thickening of 2.7 cm, right ovary not clearly seen and left ovary with normal postmenopausal appearance. She was seen by Dr Gerald Leitz and referred to Dr Clifton James. She had robotic hysterectomy with BSO and bilateral pelvic and periaortic node dissection by Dr Clifton James at [facility] on 07-30-2012. Intraoperatively she had large endometrial polyp with high grade malignancy, no obvious extrauterine disease. Pathology ([facility] [place] [id number] from 07-30-2012) had high grade, poorly differentiated endometrial adenocarcinoma with solid and serous papillary components, 2 periaortic nodes negative and 9 right pelvic/11 left pelvic nodes negative. The tumor was 4.8 cm confined to polyp with no definite myometrial involvement, margins clear and no angiolymphatic invasion identified.  Cycle 1 taxol carboplatin was given 08-30-12, 6 cycles planned.    Complained of fatigue but better than before.  No bladder symptoms. No chest pain and not SOB  at rest, but has dyspnea with just moderate exertion. Remainder of 10 point Review of Systems negative.  Objective:  Vital signs in last 24 hours:  BP 131/73  Pulse 96  Temp(Src) 97.5 F (36.4 C) (Oral)  Resp 18  Ht 5\' 3"  (1.6 m)  Wt 204 lb 8 oz (92.761 kg)  BMI 36.23 kg/m2  Weight is down 1 lb. Alert, looks tired and pale, not SOB seated in exam room.   HEENT:PERRLA, sclera clear, anicteric and oropharynx clear, no lesions Alopecia LymphaticsCervical, supraclavicular, and axillary nodes normal. Resp: clear to auscultation bilaterally and normal percussion bilaterally Cardio: regular rate and rhythm GI: soft, nontender, not distended, quiet, no HSM or mass, surgical incision not remarkable Extremities: extremities normal, atraumatic, no cyanosis or edema Neuro:no sensory deficits noted Skin pale, not icteric, no petechiae or rash, no ecchymoses  Lab Results:  Results for orders placed in visit on 01/08/13  CBC WITH DIFFERENTIAL      Result Value Range   WBC 4.2  3.9 - 10.3 10e3/uL   NEUT# 2.5  1.5 - 6.5 10e3/uL   HGB 9.0 (*) 11.6 - 15.9 g/dL   HCT 45.4 (*) 09.8 - 11.9 %   Platelets 149  145 - 400 10e3/uL   MCV 101.4 (*) 79.5 - 101.0 fL   MCH 35.3 (*) 25.1 - 34.0 pg   MCHC 34.8  31.5 - 36.0 g/dL   RBC 1.47 (*) 8.29 - 5.62 10e6/uL   RDW 17.6 (*) 11.2 - 14.5 %   lymph# 1.1  0.9 - 3.3 10e3/uL   MONO# 0.3  0.1 - 0.9 10e3/uL   Eosinophils Absolute 0.2  0.0 - 0.5 10e3/uL   Basophils Absolute 0.0  0.0 - 0.1 10e3/uL   NEUT% 59.1  38.4 - 76.8 %   LYMPH% 26.1  14.0 - 49.7 %   MONO% 8.2  0.0 - 14.0 %   EOS% 5.6  0.0 - 7.0 %   BASO% 1.0  0.0 - 2.0 %  COMPREHENSIVE METABOLIC PANEL (CC13)      Result Value Range   Sodium 140  136 - 145 mEq/L   Potassium 3.8  3.5 - 5.1 mEq/L   Chloride 105  98 - 109 mEq/L   CO2 25  22 - 29 mEq/L   Glucose 106  70 - 140 mg/dl   BUN 40.9  7.0 - 81.1 mg/dL   Creatinine 0.8  0.6 - 1.1 mg/dL   Total Bilirubin 9.14  0.20 - 1.20 mg/dL   Alkaline  Phosphatase 63  40 - 150 U/L   AST 24  5 - 34 U/L   ALT 35  0 - 55 U/L   Total Protein 7.1  6.4 - 8.3 g/dL   Albumin 3.7  3.5 - 5.0 g/dL   Calcium 9.6  8.4 - 78.2 mg/dL     Studies/Results:  No results found.  Medications: I have reviewed the patient's current medications. She will have neupogen today and 7-1, then also 7-2 depending on CBC that day.  She will have IV and po potassium at [facility] now and will repeat CMET also on 12-04-12. She will use cipro 250 mg bid while neutropenic.  Patient has been instructed in neutropenic precautions. She should not be at work until [facility] >1.0.  Assessment/Plan: 1. IA high grade endometrial carcinoma involving endometrial polyp: history as above. HDR completed 11-27-12 with follow up appointment to Dr Roselind Messier 01-02-13. Total planned 6 cycles taxol carboplatin, her cycle 6 was given on 7-11, and she is here today to f/u after treatment, felling better.  She will RTC in 4-6 weeks for reassessment and to make sure she is fully recovered  She will will see dr. Clifton James on 01/20/2013 and will have Ct scan on 01/17/2013  3.overdue mammograms, which she wants done after chemo completes 4. Hx HTN: she holds BP meds after chemo as needed 5.family history melanoma 6. Previous obesity, intentional weight loss prior to this diagnosis     Zachery Dakins, MD   01/08/2013, 10:16 AM

## 2013-01-08 NOTE — Telephone Encounter (Signed)
gv and printed appt sched and avs for pt...pt request middle of the mth...done

## 2013-01-16 ENCOUNTER — Telehealth: Payer: Self-pay | Admitting: *Deleted

## 2013-01-16 ENCOUNTER — Encounter: Payer: Self-pay | Admitting: *Deleted

## 2013-01-16 NOTE — Telephone Encounter (Signed)
Faxed last OV with Dr Karel Jarvis on 01/08/13 for Clinical Update on patient.

## 2013-01-17 ENCOUNTER — Encounter (HOSPITAL_COMMUNITY): Payer: Self-pay

## 2013-01-17 ENCOUNTER — Ambulatory Visit (HOSPITAL_COMMUNITY)
Admission: RE | Admit: 2013-01-17 | Discharge: 2013-01-17 | Disposition: A | Discharge status: Home / Self Care | Payer: 59 | Source: Ambulatory Visit | Attending: Oncology | Admitting: Oncology

## 2013-01-17 DIAGNOSIS — K7689 Other specified diseases of liver: Secondary | ICD-10-CM | POA: Insufficient documentation

## 2013-01-17 DIAGNOSIS — Z90710 Acquired absence of both cervix and uterus: Secondary | ICD-10-CM | POA: Insufficient documentation

## 2013-01-17 DIAGNOSIS — C549 Malignant neoplasm of corpus uteri, unspecified: Secondary | ICD-10-CM | POA: Insufficient documentation

## 2013-01-17 DIAGNOSIS — M47814 Spondylosis without myelopathy or radiculopathy, thoracic region: Secondary | ICD-10-CM | POA: Insufficient documentation

## 2013-01-17 DIAGNOSIS — K439 Ventral hernia without obstruction or gangrene: Secondary | ICD-10-CM | POA: Insufficient documentation

## 2013-01-17 DIAGNOSIS — I700 Atherosclerosis of aorta: Secondary | ICD-10-CM | POA: Insufficient documentation

## 2013-01-17 DIAGNOSIS — Z9221 Personal history of antineoplastic chemotherapy: Secondary | ICD-10-CM | POA: Insufficient documentation

## 2013-01-17 DIAGNOSIS — C541 Malignant neoplasm of endometrium: Secondary | ICD-10-CM

## 2013-01-17 MED ORDER — IOHEXOL 300 MG/ML  SOLN
25.0000 mL | Freq: Once | INTRAMUSCULAR | Status: AC | PRN
  Administered 2013-01-17: 25 mL via ORAL

## 2013-01-17 MED ORDER — IOHEXOL 300 MG/ML  SOLN
100.0000 mL | Freq: Once | INTRAMUSCULAR | Status: AC | PRN
  Administered 2013-01-17: 100 mL via INTRAVENOUS

## 2013-01-20 ENCOUNTER — Other Ambulatory Visit (HOSPITAL_COMMUNITY): Payer: Self-pay | Admitting: Obstetrics and Gynecology

## 2013-02-20 ENCOUNTER — Telehealth: Payer: Self-pay | Admitting: Internal Medicine

## 2013-02-20 ENCOUNTER — Other Ambulatory Visit (HOSPITAL_BASED_OUTPATIENT_CLINIC_OR_DEPARTMENT_OTHER): Payer: 59 | Admitting: Lab

## 2013-02-20 ENCOUNTER — Ambulatory Visit (HOSPITAL_BASED_OUTPATIENT_CLINIC_OR_DEPARTMENT_OTHER): Payer: 59 | Admitting: Internal Medicine

## 2013-02-20 ENCOUNTER — Encounter: Payer: Self-pay | Admitting: Internal Medicine

## 2013-02-20 VITALS — BP 114/72 | HR 102 | Temp 97.8°F | Resp 18 | Ht 63.0 in | Wt 207.3 lb

## 2013-02-20 DIAGNOSIS — C541 Malignant neoplasm of endometrium: Secondary | ICD-10-CM

## 2013-02-20 DIAGNOSIS — Z923 Personal history of irradiation: Secondary | ICD-10-CM

## 2013-02-20 DIAGNOSIS — C549 Malignant neoplasm of corpus uteri, unspecified: Secondary | ICD-10-CM

## 2013-02-20 LAB — CBC WITH DIFFERENTIAL/PLATELET
BASO%: 1.4 % (ref 0.0–2.0)
Basophils Absolute: 0.1 10*3/uL (ref 0.0–0.1)
EOS%: 5.3 % (ref 0.0–7.0)
Eosinophils Absolute: 0.4 10*3/uL (ref 0.0–0.5)
HCT: 32.2 % — ABNORMAL LOW (ref 34.8–46.6)
HGB: 11 g/dL — ABNORMAL LOW (ref 11.6–15.9)
LYMPH%: 22.7 % (ref 14.0–49.7)
MCH: 34.1 pg — ABNORMAL HIGH (ref 25.1–34.0)
MCHC: 34.2 g/dL (ref 31.5–36.0)
MCV: 99.8 fL (ref 79.5–101.0)
MONO#: 0.5 10*3/uL (ref 0.1–0.9)
MONO%: 7 % (ref 0.0–14.0)
NEUT#: 4.5 10*3/uL (ref 1.5–6.5)
NEUT%: 63.6 % (ref 38.4–76.8)
Platelets: 252 10*3/uL (ref 145–400)
RBC: 3.23 10*6/uL — ABNORMAL LOW (ref 3.70–5.45)
RDW: 13.9 % (ref 11.2–14.5)
WBC: 7 10*3/uL (ref 3.9–10.3)
lymph#: 1.6 10*3/uL (ref 0.9–3.3)

## 2013-02-20 NOTE — Telephone Encounter (Signed)
gave pt appt for lab and MD on December 2014

## 2013-02-20 NOTE — Progress Notes (Signed)
[facility] OFFICE PROGRESS NOTE  Neldon Labella, MD [address] [place] [address]  DIAGNOSIS: Endometrial cancer  Hx of radiation therapy  Chief Complaint  Patient presents with  . endometrial cancer    CURRENT THERAPY: None   INTERVAL HISTORY: Diane Lozano 57 y.o. female here for f/u for her endometrial cancer.  She reports seeing Drs. Hyacinth Meeker and [facility]) recently.  She complaints of foot numbness that has improved overall. Right hand remains numb but is stable.  She had the CT scan of the abdomen  Last month.  Her weight is stable and her energy is good.      MEDICAL HISTORY: Past Medical History  Diagnosis Date  . Hypertension   . Hyperlipidemia   . Hx of radiation therapy 5/27, 6/5, 6/11, 6/19, 11/27/2012    proximal vagina 30 Gy in 5 fx, HDR  . Endometrial ca     iA high grade papillary serous endometrial carcinoma    INTERIM HISTORY: has Endometrial cancer; HTN (hypertension); Endometrial ca; and Hx of radiation therapy on her problem list.    ALLERGIES:  has No Known Allergies.  MEDICATIONS: has a current medication list which includes the following prescription(s): aspirin, calcium carb-cholecalciferol, ferrous gluconate, hydrocodone-acetaminophen, lisinopril-hydrochlorothiazide, pantoprazole, potassium chloride, simvastatin, and venlafaxine xr.  SURGICAL HISTORY:  Past Surgical History  Procedure Laterality Date  . Cesarean section    . Cesarean section    . Abdominal hysterectomy      robotic hysterectomy with BSO and bilateral pelvic and periaortic node dissection  . Colonoscopy  2008    REVIEW OF SYSTEMS:   Constitutional: Denies fevers, chills or abnormal weight loss Eyes: Denies blurriness of vision Ears, nose, mouth, throat, and face: Denies mucositis or sore throat Respiratory: Denies cough, dyspnea or wheezes Cardiovascular: Denies palpitation, chest discomfort or lower extremity swelling Gastrointestinal:   Denies nausea, heartburn or change in bowel habits Skin: Denies abnormal skin rashes Lymphatics: Denies new lymphadenopathy or easy bruising Neurological: Reports numbness, tingling of feet and right hand but denies new weaknesses Behavioral/Psych: Mood is stable, no new changes  All other systems were reviewed with the patient and are negative.  PHYSICAL EXAMINATION: ECOG PERFORMANCE STATUS: 0 - Asymptomatic  There were no vitals taken for this visit.  GENERAL:alert, no distress and comfortable. Alopecia and moderately obsese SKIN: skin color, texture, turgor are normal, no rashes or significant lesions EYES: normal, Conjunctiva are pink and non-injected, sclera clear OROPHARYNX:no exudate, no erythema and lips, buccal mucosa, and tongue normal  NECK: supple, thyroid normal size, non-tender, without nodularity LYMPH:  no palpable lymphadenopathy in the cervical, axillary or inguinal LUNGS: clear to auscultation and percussion with normal breathing effort HEART: regular rate & rhythm and no murmurs and no lower extremity edema ABDOMEN:abdomen soft, non-tender and normal bowel sounds Musculoskeletal:no cyanosis of digits and no clubbing  NEURO: alert & oriented x 3 with fluent speech, no focal motor/sensory deficits   LABORATORY DATA: Results for orders placed in visit on 02/20/13 (from the past 48 hour(s))  CBC WITH DIFFERENTIAL     Status: Abnormal   Collection Time    02/20/13  8:17 AM      Result Value Range   WBC 7.0  3.9 - 10.3 10e3/uL   NEUT# 4.5  1.5 - 6.5 10e3/uL   HGB 11.0 (*) 11.6 - 15.9 g/dL   HCT 60.4 (*) 54.0 - 98.1 %   Platelets 252  145 - 400 10e3/uL   MCV 99.8  79.5 - 101.0 fL   MCH 34.1 (*) 25.1 - 34.0 pg   MCHC 34.2  31.5 - 36.0 g/dL   RBC 4.54 (*) 0.98 - 1.19 10e6/uL   RDW 13.9  11.2 - 14.5 %   lymph# 1.6  0.9 - 3.3 10e3/uL   MONO# 0.5  0.1 - 0.9 10e3/uL   Eosinophils Absolute 0.4  0.0 - 0.5 10e3/uL   Basophils Absolute 0.1  0.0 - 0.1 10e3/uL   NEUT%  63.6  38.4 - 76.8 %   LYMPH% 22.7  14.0 - 49.7 %   MONO% 7.0  0.0 - 14.0 %   EOS% 5.3  0.0 - 7.0 %   BASO% 1.4  0.0 - 2.0 %      RADIOGRAPHIC STUDIES: CT ABDOMEN AND PELVIS WITH CONTRAST 01/17/2013 Technique: Multidetector CT imaging of the abdomen and pelvis was performed following the standard protocol during bolus administration of intravenous contrast.  Contrast: OMNIPAQUE IOHEXOL 300 MG/ML IV. Oral contrast was also administered.  Comparison: CT abdomen and pelvis 08/28/2012.  Findings: Mild diffuse hepatic steatosis without focal hepatic  parenchymal abnormality. Normal appearing spleen, pancreas, adrenal glands, kidneys, and gallbladder. No biliary ductal dilation. Minimal upper abdominal aortic atherosclerosis. No significant lymphadenopathy in the abdomen or pelvis. Stomach decompressed and unremarkable. Normal-appearing smallbowel. Normal appearing colon with moderate stool burden. Normal  appendix in the right upper pelvis. No ascites. Very small  supraumbilical midline abdominal wall hernia containing fat,  unchanged. Uterus surgically absent. No adnexal masses or free pelvic fluid. Phleboliths low in both sides of the pelvis.  Bone window images demonstrate lower thoracic spondylosis and facet degenerative changes involving the lower lumbar spine. Visualized lung bases clear. Heart size upper normal and stable.  IMPRESSION:  1. No evidence of recurrent or metastatic disease.  2. No acute abnormality involving the abdomen or pelvis.  3. Mild diffuse hepatic steatosis.  4. Very small supraumbilical midline anterior abdominal wall  hernia containing fat, unchanged.    ASSESSMENT:  IA high grade endometrial carcinoma involving endometrial polyp: history as above.   PLAN:  1. HDR completed 11-27-12 with follow up appointment to Dr Roselind Messier 01-02-13. Total planned 6 cycles taxol carboplatin, her cycle 6 was given on 7-11, and she is here today to f/u .  -She will RTC in 3  months for basic labs and H & P.  -- Ct scan on 01/17/2013 showed no evidence of disease reoccurence of metastases.  --Overdue mammograms, she will schedule ASAP.  All questions were answered. The patient knows to call the clinic with any problems, questions or concerns. We can certainly see the patient much sooner if necessary.  I spent 15 minutes counseling the patient face to face. The total time spent in the appointment was 30 minutes.    Audie Wieser, MD 02/20/2013 8:34 AM

## 2013-02-21 ENCOUNTER — Other Ambulatory Visit: Payer: Self-pay | Admitting: Oncology

## 2013-02-25 ENCOUNTER — Telehealth: Payer: Self-pay | Admitting: Oncology

## 2013-02-25 NOTE — Telephone Encounter (Signed)
S/w the pt and she has decided to go back to see dr Darrold Span.

## 2013-03-06 ENCOUNTER — Other Ambulatory Visit (HOSPITAL_COMMUNITY): Payer: Self-pay | Admitting: Family Medicine

## 2013-03-06 DIAGNOSIS — Z1231 Encounter for screening mammogram for malignant neoplasm of breast: Secondary | ICD-10-CM

## 2013-03-18 ENCOUNTER — Ambulatory Visit (HOSPITAL_COMMUNITY)
Admission: RE | Admit: 2013-03-18 | Discharge: 2013-03-18 | Disposition: A | Discharge status: Home / Self Care | Payer: 59 | Source: Ambulatory Visit | Attending: Family Medicine | Admitting: Family Medicine

## 2013-03-18 DIAGNOSIS — Z1231 Encounter for screening mammogram for malignant neoplasm of breast: Secondary | ICD-10-CM | POA: Insufficient documentation

## 2013-04-07 ENCOUNTER — Ambulatory Visit
Admission: RE | Admit: 2013-04-07 | Discharge: 2013-04-07 | Disposition: A | Discharge status: Home / Self Care | Payer: 59 | Source: Ambulatory Visit | Attending: Radiation Oncology | Admitting: Radiation Oncology

## 2013-04-07 ENCOUNTER — Encounter: Payer: Self-pay | Admitting: Radiation Oncology

## 2013-04-07 VITALS — BP 115/63 | HR 92 | Temp 98.0°F | Resp 16 | Wt 209.7 lb

## 2013-04-07 DIAGNOSIS — C541 Malignant neoplasm of endometrium: Secondary | ICD-10-CM

## 2013-04-07 NOTE — Progress Notes (Signed)
   Department of Radiation Oncology  Phone:  [phone] Fax:        [phone]  The patient will be following up with Dr. Clifton Janetta Vandoren every 3 months for pelvic exam and and Pap smear. Patient was scheduled for examination today but is scheduled to see Dr. Clifton Kate Larock later this month.  I will defer followup to gynecologic oncology but would be glad to see the patient  at any time.  Today we discussed anticipated followup schedule which will extend over a 5 year period.  -----------------------------------  Billie Lade, PhD, MD

## 2013-04-07 NOTE — Progress Notes (Signed)
Reports using vaginal dilator twice per week. Reports that she is almost back to baseline. Saw Dr. Clifton James in August and she scheduled for next follow up November 20th. Denies vaginal discharge or odor. Reports only occasional left side vaginal itching. Denies nausea, vomiting, headache, dizziness, night sweat, weight loss or weakness. Also, scheduled to follow up with [facility] 05/26/2013. Vital stable.

## 2013-04-07 NOTE — Progress Notes (Signed)
Diane Spates, MD - 01/19/2013 2:55 PM EDT Referring Physician: Gerald Leitz, MD  Chief Complaint: Endometrial Cancer, follow-up.  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 Due to the high-risk nature of her disease recommendations were made to proceed with adjuvant treatment. Given that she was from [place], we set her up to receive 6 cycles of Taxol and carboplatin under the care of Dr. Darrold Span in Medical Oncology and also to receive vaginal brachytherapy under the care of Rad Onc there. She has now completed her treatments and returns today for follow-up. She did have a post-treatment CT scan on January 17, 2013 which showed no evidence of active or current disease. She presents today for follow-up.  Review of Systems: On review of systems, the patient reports that overall she has done very well. She tolerated her treatments without significant difficulty. She did have to receive one blood transfusion and has had a little bit of neuropathy in her fingers and toes but outside of this has had no real significant problems. She currently denies any vaginal bleeding or discharge. She denies any chest pain or shortness of breath. She reports no nausea or vomiting fevers or chills. Her bowels and bladder are working well. She denies any pelvic or abdominal pain. No other complaints are verbalized. A 10 point review of systems is obtained and is otherwise negative  Past Medical History: Past Medical History  Diagnosis Date  . Hypertension  . Dyslipidemia  . Depression  . Uterine cancer  IA mixed endometrioid and papillary serous, s/p 6 cycles taxol/carbo and vaginal brachytherapy  completed in July 2014   OB/GYN History:The patient is a G2 P2 with 2 C-section deliveries. She reports menarche in her early teens and menopause in her late 39s. She did not take hormone therapy for menopause. She has never had any abnormal pap smears and her most recent was within the last 12 months.  Past Surgical History: Past Surgical History  Procedure Laterality Date  . Cesarean section  . Wisdom tooth extraction  . Robotic assisted hysterectomy 07/30/12  with BSO and pelvic and para-aortic LND   Health Maintenance:The patient states it has been about 2 years since her last mammogram. She did have a colonoscopy in approximately 2008. She states that this was normal  Family History: Family History  Problem Relation Age of Onset  . Tumor Brother  brain tumor-radiation seeds. tumor posterior to eye   Social History: History   Social History  . Marital Status: Married  Spouse Name: N/A  Number of Children: N/A  . Years of Education: N/A   Occupational History  . Not on file.   Social History Main Topics  . Smoking status: Former Smoker -- 10 years  . Smokeless tobacco: Former Neurosurgeon  Quit date: 01/18/1983  . Alcohol Use: Yes  Comment: occasional  . Drug Use: No  . Sexually Active: Not on file   Other Topics Concern  . Not on file   Social History Narrative  . No narrative on file   Medications: Current Medications  Medication Sig  aspirin (ASPIRIN) 81 mg chewable tablet Chew 81 mg by mouth daily.  Calcium Carb-Cholecalciferol 500-600 MG-UNIT TABS Take 1 tablet by mouth daily.  ferrous  gluconate (FERGON) 324 MG tablet Take 324 mg by mouth every morning.  lisinopril-hydrochlorothiazide (PRINZIDE,ZESTORETIC) 20-12.5 MG per tablet Take 1 tablet by mouth daily.  pantoprazole (PROTONIX) 40 MG tablet Take 40 mg by mouth daily.  simvastatin (ZOCOR) 40 MG tablet Take 40 mg by mouth every evening.  venlafaxine HCl (EFFEXOR-XR) 75 mg 24 hr capsule Take 75 mg by mouth  daily.   Allergies: No Known Allergies  Physical Exam: BP 126/72  Pulse 93  Temp(Src) 98.6 F (37 C) (Oral)  Resp 16  Wt 206 lb (93.441 kg)  SpO2 97%  In general, this is a pleasant, white female who is in no acute distress. She is alert and oriented x 4 and appropriate throughout the entire evaluation. Her abdomen is noted to be soft, nontender, nondistended. No organomegaly is noted. No fascial defects are noted. Extremities are negative for edema or calf tenderness. Pelvic examination reveals atrophic but normal external female genitalia. Otherwise, no gross lesions were identified. She has a normal BUS. On speculum examination, the vaginal mucosa is atrophic, but grossly normal. No abnormal discharge or bleeding is noted. No lesions are seen grossly. Bimanual exam nation reveals a supple vagina without induration or nodularity. No intra-abdominal masses are noted. Rectovaginal exam confirms .  Labs: No results found for this or any previous visit (from the past 24 hour(s)).  Radiology: CT of Abd/pelvis from Jan 17, 2013 which I have personally reviewed both the images and the report. Findings: Mild diffuse hepatic steatosis without focal hepatic parenchymal abnormality. Normal appearing spleen, pancreas, adrenal glands, kidneys, and gallbladder. No biliary ductal dilation. Minimal upper abdominal aortic atherosclerosis. No significant lymphadenopathy in the abdomen or pelvis.  Stomach decompressed and unremarkable. Normal-appearing small bowel. Normal appearing colon with moderate stool burden. Normal appendix in the right upper pelvis. No ascites. Very small supraumbilical midline abdominal wall hernia containing fat, unchanged.  Uterus surgically absent. No adnexal masses or free pelvic fluid. Phleboliths low in both sides of the pelvis.  Bone window images demonstrate lower thoracic spondylosis and facet degenerative changes involving the lower lumbar spine.  Visualized lung bases clear. Heart size upper normal and stable.  IMPRESSION:  1. No evidence of recurrent or metastatic disease. 2. No acute abnormality involving the abdomen or pelvis. 3. Mild diffuse hepatic steatosis. 4. Very small supraumbilical midline anterior abdominal wall hernia containing fat, unchanged.  A/P:  1. Stage IA mixed endometrioid and papillary serous carcinoma of the uterus. The patient has now completed all recommended adjuvant treatment having received her 6 cycles of Taxol and carboplatin and her vaginal brachytherapy. Her imaging studies do not show evidence of active disease at this time. Recommendations are made with the patient to proceed into a surveillance mood and we will plan on seeing her back in 3 months to begin this. The patient understands that the goal of this surveillance is to monitor evidence of recurrent disease by means of Pap smear and blood work and examinations. Patient however is given instructions to notify our office should she have issues prior to that time. Patient verbalized understanding and all questions were answered. 2. Chemotherapy induced neuropathy. At this point time, the patient does not seem to have functional difficulty with the neuropathy in terms of limitation of daily activities. She is also not significantly troubled by the symptoms. For now I have recommended consideration of a watch and wait approach as this is something that may abate over time. The patient is on board with doing this and will  plan on following up with this symptom with the patient at her next visit.  Diane Lozano 01/20/2013 9:57 AM

## 2013-04-10 ENCOUNTER — Other Ambulatory Visit: Payer: Self-pay

## 2013-05-23 ENCOUNTER — Other Ambulatory Visit: Payer: 59 | Admitting: Lab

## 2013-05-23 ENCOUNTER — Ambulatory Visit: Payer: 59

## 2013-05-25 ENCOUNTER — Other Ambulatory Visit: Payer: Self-pay | Admitting: Oncology

## 2013-05-26 ENCOUNTER — Telehealth: Payer: Self-pay | Admitting: Oncology

## 2013-05-26 ENCOUNTER — Ambulatory Visit: Payer: 59 | Admitting: Oncology

## 2013-05-26 ENCOUNTER — Ambulatory Visit (HOSPITAL_BASED_OUTPATIENT_CLINIC_OR_DEPARTMENT_OTHER): Payer: 59 | Admitting: Oncology

## 2013-05-26 ENCOUNTER — Encounter: Payer: Self-pay | Admitting: Oncology

## 2013-05-26 ENCOUNTER — Other Ambulatory Visit: Payer: 59

## 2013-05-26 ENCOUNTER — Other Ambulatory Visit (HOSPITAL_BASED_OUTPATIENT_CLINIC_OR_DEPARTMENT_OTHER): Payer: 59

## 2013-05-26 VITALS — BP 121/74 | HR 99 | Temp 98.2°F | Resp 18 | Ht 63.0 in | Wt 210.1 lb

## 2013-05-26 DIAGNOSIS — C541 Malignant neoplasm of endometrium: Secondary | ICD-10-CM

## 2013-05-26 DIAGNOSIS — C549 Malignant neoplasm of corpus uteri, unspecified: Secondary | ICD-10-CM

## 2013-05-26 DIAGNOSIS — Z923 Personal history of irradiation: Secondary | ICD-10-CM

## 2013-05-26 DIAGNOSIS — I10 Essential (primary) hypertension: Secondary | ICD-10-CM

## 2013-05-26 LAB — CBC WITH DIFFERENTIAL/PLATELET
BASO%: 1.4 % (ref 0.0–2.0)
Basophils Absolute: 0.1 10*3/uL (ref 0.0–0.1)
EOS%: 4.4 % (ref 0.0–7.0)
Eosinophils Absolute: 0.3 10*3/uL (ref 0.0–0.5)
HCT: 33.5 % — ABNORMAL LOW (ref 34.8–46.6)
HGB: 11.4 g/dL — ABNORMAL LOW (ref 11.6–15.9)
LYMPH%: 23.7 % (ref 14.0–49.7)
MCH: 32.3 pg (ref 25.1–34.0)
MCHC: 33.9 g/dL (ref 31.5–36.0)
MCV: 95.2 fL (ref 79.5–101.0)
MONO#: 0.6 10*3/uL (ref 0.1–0.9)
MONO%: 9.8 % (ref 0.0–14.0)
NEUT#: 3.8 10*3/uL (ref 1.5–6.5)
NEUT%: 60.7 % (ref 38.4–76.8)
Platelets: 261 10*3/uL (ref 145–400)
RBC: 3.52 10*6/uL — ABNORMAL LOW (ref 3.70–5.45)
RDW: 13.1 % (ref 11.2–14.5)
WBC: 6.2 10*3/uL (ref 3.9–10.3)
lymph#: 1.5 10*3/uL (ref 0.9–3.3)

## 2013-05-26 LAB — COMPREHENSIVE METABOLIC PANEL (CC13)
ALT: 28 U/L (ref 0–55)
AST: 21 U/L (ref 5–34)
Albumin: 4 g/dL (ref 3.5–5.0)
Alkaline Phosphatase: 73 U/L (ref 40–150)
Anion Gap: 11 mEq/L (ref 3–11)
BUN: 27.7 mg/dL — ABNORMAL HIGH (ref 7.0–26.0)
CO2: 25 mEq/L (ref 22–29)
Calcium: 9.9 mg/dL (ref 8.4–10.4)
Chloride: 104 mEq/L (ref 98–109)
Creatinine: 1.2 mg/dL — ABNORMAL HIGH (ref 0.6–1.1)
Glucose: 81 mg/dl (ref 70–140)
Potassium: 4.2 mEq/L (ref 3.5–5.1)
Sodium: 140 mEq/L (ref 136–145)
Total Bilirubin: 0.38 mg/dL (ref 0.20–1.20)
Total Protein: 7.9 g/dL (ref 6.4–8.3)

## 2013-05-26 NOTE — Telephone Encounter (Signed)
, °

## 2013-05-26 NOTE — Patient Instructions (Signed)
Every 3 month follow up with Dr Clifton James as scheduled.   Dr Clifton James or Dr Sigmund Hazel to check CBC after first of year, then at least yearly if all back to normal  Dr.Livesay can see you any time if you or other MDs request

## 2013-05-26 NOTE — Progress Notes (Signed)
OFFICE PROGRESS NOTE   05/26/2013   Physicians:Diane Skinner, MD, Diane Blackbird MD, Diane Hazel MD, Diane Leitz MD, [facility]   INTERVAL HISTORY:  Patient is seen, alone for visit, in follow up of adjuvant taxol carboplatin chemotherapy given for high grade IA endometrial carcinoma involving endometrial polyp,  from 08-30-12 thru 12-13-12. Last imaging was [facility] 01-17-2013. She saw Dr Roselind Messier on 04-07-13 and will have prn follow up with him as she is on every 3 month follow up by Dr Clifton James. She was last seen by PA at Dr Waynard Edwards office on 04-24-13, with PAP done. 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 Peripheral neuropathy symptoms in fingers are much better and in feet/toes also improved back almost to baseline. Bowels are moving regularly, no abdominal or pelvic discomfort, bladder ok. She plans to start diet and exercise programs after holidays; we have discussed importance of weight loss to ideal with endometrial cancer history.  She had unremarkable 3D mammograms at [facility] 03-19-13. No central catheter  ONCOLOGIC HISTORY Patient presented to PCP Diane Lozano with intermittent vaginal bleeding since ~ Oct 2013. Dr Hyacinth Meeker did endometrial biopsy 06-20-12 which showed complex endometrial hyperplasia with atypia. She had pelvic and transvaginal US in  system on 06-26-12, which showed endometrial thickening of 2.7 cm, right ovary not clearly seen and left ovary with normal postmenopausal appearance. She was seen by Dr Diane Lozano and referred to Dr Clifton James. She had robotic hysterectomy with BSO and bilateral pelvic and periaortic node dissection by Dr Clifton James at [facility] on 07-30-2012. Intraoperatively she had large endometrial polyp with high grade malignancy, no obvious extrauterine disease. Pathology ([facility] [place] [id number] from 07-30-2012) had high grade, poorly differentiated endometrial adenocarcinoma with solid and serous papillary components, 2 periaortic nodes negative and 9 right pelvic/11 left pelvic nodes negative. The tumor was 4.8 cm confined to polyp with no definite myometrial involvement, margins clear and no angiolymphatic invasion identified.  Cycle 1 taxol carboplatin was given 08-30-12, 6 cycles of q 3 week regimen given thru 12-13-2012, with gCSF support. Radiation was intracavitary HDR to proximal vagina 30 Gy in 5 fractions from May 27 thru November 27, 2012, by Dr Roselind Messier.  Review of systems as above, also: Viral URI this week with head congestion and drainage, no fever and no lower respiratory symptoms. No bleeding. No other recent infectious illness. No nausea now other than mild with URI drainage this week. No pain. Remainder of 10 point Review of Systems negative.  Objective:  Vital signs in last 24 hours:  BP 121/74  Pulse 99  Temp(Src) 98.2 F (36.8 C) (Oral)  Resp 18  Ht 5\' 3"  (1.6 m)  Wt 210 lb 1.6 oz (95.301 kg)  BMI 37.23 kg/m2  Weight is up 6 lbs from March 2014.  Alert, fully oriented and appropriate, just delightful as always. Easily ambulatory. Nasally congested but not acutely ill. No alopecia  HEENT:PERRL, sclerae not icteric. Oral mucosa moist without lesions, posterior pharynx with dull erythema, no exudate. Nasal turbinates boggy without purulent drainage Neck supple. No JVD.  Lymphatics:no cervical,suraclavicular, axillary or inguinal adenopathy Resp: clear to auscultation bilaterally and normal percussion bilaterally Cardio: regular rate and rhythm. No gallop. GI: soft, nontender, not distended, no mass or organomegaly. Normally active bowel sounds. Surgical incision not remarkable. Musculoskeletal/ Extremities: without pitting edema, cords,  tenderness Neuro: no peripheral neuropathy. Otherwise nonfocal Skin without rash, ecchymosis,  petechiae Breasts: without dominant mass, skin or nipple findings. Axillae benign.   Lab Results:  Results for orders placed in visit on 05/26/13  CBC WITH DIFFERENTIAL      Result Value Range   WBC 6.2  3.9 - 10.3 10e3/uL   NEUT# 3.8  1.5 - 6.5 10e3/uL   HGB 11.4 (*) 11.6 - 15.9 g/dL   HCT 44.0 (*) 10.2 - 72.5 %   Platelets 261  145 - 400 10e3/uL   MCV 95.2  79.5 - 101.0 fL   MCH 32.3  25.1 - 34.0 pg   MCHC 33.9  31.5 - 36.0 g/dL   RBC 3.66 (*) 4.40 - 3.47 10e6/uL   RDW 13.1  11.2 - 14.5 %   lymph# 1.5  0.9 - 3.3 10e3/uL   MONO# 0.6  0.1 - 0.9 10e3/uL   Eosinophils Absolute 0.3  0.0 - 0.5 10e3/uL   Basophils Absolute 0.1  0.0 - 0.1 10e3/uL   NEUT% 60.7  38.4 - 76.8 %   LYMPH% 23.7  14.0 - 49.7 %   MONO% 9.8  0.0 - 14.0 %   EOS% 4.4  0.0 - 7.0 %   BASO% 1.4  0.0 - 2.0 %  COMPREHENSIVE METABOLIC PANEL (CC13)      Result Value Range   Sodium 140  136 - 145 mEq/L   Potassium 4.2  3.5 - 5.1 mEq/L   Chloride 104  98 - 109 mEq/L   CO2 25  22 - 29 mEq/L   Glucose 81  70 - 140 mg/dl   BUN 42.5 (*) 7.0 - 95.6 mg/dL   Creatinine 1.2 (*) 0.6 - 1.1 mg/dL   Total Bilirubin 3.87  0.20 - 1.20 mg/dL   Alkaline Phosphatase 73  40 - 150 U/L   AST 21  5 - 34 U/L   ALT 28  0 - 55 U/L   Total Protein 7.9  6.4 - 8.3 g/dL   Albumin 4.0  3.5 - 5.0 g/dL   Calcium 9.9  8.4 - 56.4 mg/dL   Anion Gap 11  3 - 11 mEq/L    This hgb is up from 11 in Sept and 9 in August. CMET available after visit: creatinine had been 0.8 on 8--6-14, may be slightly dehydrated with the URI.  Studies/Results: CT ABDOMEN AND PELVIS WITH CONTRAST 01-17-13 Comparison: CT abdomen and pelvis 08/28/2012.  Findings: Mild diffuse hepatic steatosis without focal hepatic  parenchymal abnormality. Normal appearing spleen, pancreas,  adrenal glands, kidneys, and gallbladder. No biliary ductal  dilation. Minimal upper abdominal aortic atherosclerosis. No  significant lymphadenopathy in the abdomen or pelvis.  Stomach  decompressed and unremarkable. Normal-appearing small  bowel. Normal appearing colon with moderate stool burden. Normal  appendix in the right upper pelvis. No ascites. Very small  supraumbilical midline abdominal wall hernia containing fat,  unchanged.  Uterus surgically absent. No adnexal masses or free pelvic fluid.  Phleboliths low in both sides of the pelvis.  Bone window images demonstrate lower thoracic spondylosis and facet  degenerative changes involving the lower lumbar spine. Visualized  lung bases clear. Heart size upper normal and stable.  IMPRESSION:  1. No evidence of recurrent or metastatic disease.  2. No acute abnormality involving the abdomen or pelvis.  3. Mild diffuse hepatic steatosis.  4. Very small supraumbilical midline anterior abdominal wall  hernia containing fat, unchanged.    EXAM:  DIGITAL SCREENING BILATERAL MAMMOGRAM WITH  3D TOMO WITH CAD 03-19-13 COMPARISON: Previous exam(s).  ACR Breast Density Category b: There are scattered areas of  fibroglandular density.  FINDINGS:  There are no findings suspicious for malignancy. Images were  processed with CAD.  IMPRESSION:  No mammographic evidence of malignancy. A result letter of this  screening mammogram will be mailed directly to the patient.  RECOMMENDATION:  Screening mammogram in one year. (Code:SM-B-01Y)  BI-RADS CATEGORY 1: Negative   Medications: I have reviewed the patient's current medications. She did have flu vaccine this fall.  DISCUSSION: Patient's blood counts are back essentially to normal and chemotherapy related side effects essentially resolved. With her close follow up by Dr Clifton James, I will see her prn, which I am certainly glad to do at any time if she or other MDs feel that I can help. We have discussed symptoms of recurrent disease, tho hopefully all of the treatment has eradicated that cancer. She should have CBC checked in 6-12 months and then at least yearly by PCP or Dr  Clifton James due to prior chemo. I have mentioned the diet/exercise study (open for ovarian cancer but applicable for endometrial cancer), which has exercise goal of 5000 steps daily above baseline. All questions answered to her satisfaction and she is in agreement with plan.  Assessment/Plan:  1.IA high grade endometrial carcinoma involving endometrial polyp, post optimal surgery by Dr Clifton James 07-30-12 ,adjuvant taxol carboplatin q 3 weeks x 6 cycles from 08-30-12 thru 12-13-12, and HDR by Dr Roselind Messier. She will continue close follow up by Dr Clifton James, with prn follow up at this office and by Dr Roselind Messier. 2.HTN, elevated lipids  3.obesity: she is very aware that she needs to address this with both diet and exercise, and is motivated to do this.  4. mammograms now up to date 5.melanoma in brother  Copy of this note to Drs Clifton James and PCP Dr Hyacinth Meeker. Time spent 25 min including >50% counseling and coordination of care.  Diane Packer, MD   05/26/2013, 10:25 PM

## 2013-05-27 ENCOUNTER — Ambulatory Visit: Payer: 59 | Admitting: Oncology

## 2013-05-27 ENCOUNTER — Other Ambulatory Visit: Payer: 59 | Admitting: Lab

## 2013-05-28 ENCOUNTER — Encounter: Payer: Self-pay | Admitting: Oncology

## 2013-05-28 ENCOUNTER — Telehealth: Payer: Self-pay

## 2013-05-28 NOTE — Progress Notes (Signed)
[facility] END OF TREATMENT   Name: Diane Lozano Date: 05/28/2013 MRN: [id number] DOB: February 22, 1956   TREATMENT DATES: 08-30-2012 thru 7-11- 2014   REFERRING PHYSICIAN:  Dr Madaline Guthrie  DIAGNOSIS: high grade endometrial cancer  STAGE AT START OF TREATMENT: IA in endometrial polyp   INTENT: curative   DRUGS OR REGIMENS GIVEN: taxol carboplatin q 3 weeks x 6 cycles   MAJOR TOXICITIES: cytopenias, peripheral neuropathy, fatigue   REASON TREATMENT STOPPED: completion of planned course   PERFORMANCE STATUS AT END: 1   ONGOING PROBLEMS: peripheral neuropathy, mild anemia, fatigue   FOLLOW UP PLANS: radiation oncology, medical oncology until resolution of chemo side effects, and gyn oncology

## 2013-05-28 NOTE — Telephone Encounter (Signed)
Discussed creatine level with Diane Lozano as noted below by Dr. Darrold Span.  Pt. Verbalized understanding and will increase fluid intake as suggested.

## 2013-05-28 NOTE — Telephone Encounter (Signed)
Message copied by Lorine Bears on Wed May 28, 2013  1:43 PM ------      Message from: Jama Flavors P      Created: Wed May 28, 2013 11:34 AM       Please let her know creatinine was a little higher by labs yesterday, at 1.2. I think she may have been a little dehydrated with the URI. I have sent the information to Drs Clifton James and Hyacinth Meeker. She needs to push fluids. ------

## 2013-10-31 ENCOUNTER — Other Ambulatory Visit (HOSPITAL_COMMUNITY): Payer: Self-pay | Admitting: Obstetrics and Gynecology

## 2013-10-31 DIAGNOSIS — C541 Malignant neoplasm of endometrium: Secondary | ICD-10-CM

## 2014-01-22 ENCOUNTER — Ambulatory Visit (HOSPITAL_COMMUNITY)
Admission: RE | Admit: 2014-01-22 | Discharge: 2014-01-22 | Disposition: A | Discharge status: Home / Self Care | Payer: 59 | Source: Ambulatory Visit | Attending: Obstetrics and Gynecology | Admitting: Obstetrics and Gynecology

## 2014-01-22 ENCOUNTER — Encounter (HOSPITAL_COMMUNITY): Payer: Self-pay

## 2014-01-22 DIAGNOSIS — C549 Malignant neoplasm of corpus uteri, unspecified: Secondary | ICD-10-CM | POA: Diagnosis present

## 2014-01-22 DIAGNOSIS — C541 Malignant neoplasm of endometrium: Secondary | ICD-10-CM

## 2014-01-22 LAB — COMPREHENSIVE METABOLIC PANEL
ALT: 13 U/L (ref 0–35)
AST: 16 U/L (ref 0–37)
Albumin: 3.8 g/dL (ref 3.5–5.2)
Alkaline Phosphatase: 57 U/L (ref 39–117)
Anion gap: 10 (ref 5–15)
BUN: 17 mg/dL (ref 6–23)
CO2: 27 mEq/L (ref 19–32)
Calcium: 10 mg/dL (ref 8.4–10.5)
Chloride: 102 mEq/L (ref 96–112)
Creatinine, Ser: 0.75 mg/dL (ref 0.50–1.10)
GFR calc Af Amer: >90 mL/min (ref >90)
GFR calc non Af Amer: >90 mL/min (ref >90)
Glucose, Bld: 85 mg/dL (ref 70–99)
Potassium: 3.7 mEq/L (ref 3.7–5.3)
Sodium: 139 mEq/L (ref 137–147)
Total Bilirubin: 0.4 mg/dL (ref 0.3–1.2)
Total Protein: 7.2 g/dL (ref 6.0–8.3)

## 2014-01-22 MED ORDER — IOHEXOL 300 MG/ML  SOLN
100.0000 mL | Freq: Once | INTRAMUSCULAR | Status: AC | PRN
  Administered 2014-01-22: 100 mL via INTRAVENOUS

## 2014-01-22 MED ORDER — IOHEXOL 300 MG/ML  SOLN
50.0000 mL | Freq: Once | INTRAMUSCULAR | Status: AC | PRN
  Administered 2014-01-22: 50 mL via ORAL

## 2014-02-02 ENCOUNTER — Ambulatory Visit
Admission: RE | Admit: 2014-02-02 | Discharge: 2014-02-02 | Disposition: A | Discharge status: Home / Self Care | Payer: 59 | Source: Ambulatory Visit | Attending: Radiation Oncology | Admitting: Radiation Oncology

## 2014-02-02 ENCOUNTER — Other Ambulatory Visit: Payer: Self-pay | Admitting: Radiation Oncology

## 2014-02-02 DIAGNOSIS — C549 Malignant neoplasm of corpus uteri, unspecified: Secondary | ICD-10-CM

## 2014-02-12 ENCOUNTER — Other Ambulatory Visit (HOSPITAL_COMMUNITY): Payer: Self-pay | Admitting: Obstetrics and Gynecology

## 2014-02-12 DIAGNOSIS — R911 Solitary pulmonary nodule: Secondary | ICD-10-CM

## 2014-02-19 ENCOUNTER — Encounter (HOSPITAL_COMMUNITY): Payer: Self-pay

## 2014-02-19 ENCOUNTER — Ambulatory Visit (HOSPITAL_COMMUNITY)
Admission: RE | Admit: 2014-02-19 | Discharge: 2014-02-19 | Disposition: A | Discharge status: Home / Self Care | Payer: 59 | Source: Ambulatory Visit | Attending: Obstetrics and Gynecology | Admitting: Obstetrics and Gynecology

## 2014-02-19 DIAGNOSIS — R911 Solitary pulmonary nodule: Secondary | ICD-10-CM

## 2014-02-19 DIAGNOSIS — R918 Other nonspecific abnormal finding of lung field: Secondary | ICD-10-CM | POA: Diagnosis present

## 2014-02-19 MED ORDER — IOHEXOL 300 MG/ML  SOLN
80.0000 mL | Freq: Once | INTRAMUSCULAR | Status: AC | PRN
  Administered 2014-02-19: 80 mL via INTRAVENOUS

## 2014-04-13 ENCOUNTER — Other Ambulatory Visit (HOSPITAL_COMMUNITY): Payer: Self-pay | Admitting: Family Medicine

## 2014-04-13 DIAGNOSIS — Z1231 Encounter for screening mammogram for malignant neoplasm of breast: Secondary | ICD-10-CM

## 2014-04-17 ENCOUNTER — Ambulatory Visit (HOSPITAL_COMMUNITY)
Admission: RE | Admit: 2014-04-17 | Discharge: 2014-04-17 | Disposition: A | Discharge status: Home / Self Care | Payer: 59 | Source: Ambulatory Visit | Attending: Family Medicine | Admitting: Family Medicine

## 2014-04-17 DIAGNOSIS — Z1231 Encounter for screening mammogram for malignant neoplasm of breast: Secondary | ICD-10-CM | POA: Insufficient documentation

## 2014-10-08 ENCOUNTER — Other Ambulatory Visit (HOSPITAL_COMMUNITY): Payer: Self-pay | Admitting: Obstetrics and Gynecology

## 2014-10-08 DIAGNOSIS — C541 Malignant neoplasm of endometrium: Secondary | ICD-10-CM

## 2014-10-12 ENCOUNTER — Ambulatory Visit (HOSPITAL_COMMUNITY)
Admission: RE | Admit: 2014-10-12 | Discharge: 2014-10-12 | Disposition: A | Discharge status: Home / Self Care | Payer: 59 | Source: Ambulatory Visit | Attending: Obstetrics and Gynecology | Admitting: Obstetrics and Gynecology

## 2014-10-12 ENCOUNTER — Encounter (HOSPITAL_COMMUNITY): Payer: Self-pay

## 2014-10-12 DIAGNOSIS — Z08 Encounter for follow-up examination after completed treatment for malignant neoplasm: Secondary | ICD-10-CM | POA: Insufficient documentation

## 2014-10-12 DIAGNOSIS — C541 Malignant neoplasm of endometrium: Secondary | ICD-10-CM | POA: Insufficient documentation

## 2014-10-12 DIAGNOSIS — R590 Localized enlarged lymph nodes: Secondary | ICD-10-CM | POA: Insufficient documentation

## 2014-10-12 DIAGNOSIS — R971 Elevated cancer antigen 125 [CA 125]: Secondary | ICD-10-CM | POA: Diagnosis not present

## 2014-10-12 DIAGNOSIS — Z9221 Personal history of antineoplastic chemotherapy: Secondary | ICD-10-CM | POA: Diagnosis not present

## 2014-10-12 DIAGNOSIS — Z90722 Acquired absence of ovaries, bilateral: Secondary | ICD-10-CM | POA: Insufficient documentation

## 2014-10-12 DIAGNOSIS — Z9079 Acquired absence of other genital organ(s): Secondary | ICD-10-CM | POA: Insufficient documentation

## 2014-10-12 LAB — POCT I-STAT CREATININE: Creatinine, Ser: 0.9 mg/dL (ref 0.44–1.00)

## 2014-10-12 MED ORDER — IOHEXOL 300 MG/ML  SOLN
100.0000 mL | Freq: Once | INTRAMUSCULAR | Status: AC | PRN
  Administered 2014-10-12: 100 mL via INTRAVENOUS

## 2014-10-12 MED ORDER — IOHEXOL 300 MG/ML  SOLN
50.0000 mL | Freq: Once | INTRAMUSCULAR | Status: AC | PRN
  Administered 2014-10-12: 50 mL via ORAL

## 2014-11-09 ENCOUNTER — Telehealth: Payer: Self-pay | Admitting: Oncology

## 2014-11-09 ENCOUNTER — Other Ambulatory Visit: Payer: Self-pay | Admitting: Oncology

## 2014-11-09 DIAGNOSIS — C541 Malignant neoplasm of endometrium: Secondary | ICD-10-CM

## 2014-11-09 NOTE — Telephone Encounter (Signed)
Called patient an she is aware of her appointment

## 2014-11-09 NOTE — Telephone Encounter (Signed)
Medical Oncology  Received request from Dr Genia Del to see this mutual patient back, as she now has recurrent endometrial cancer. Patient last at this office 05-2013. POF sent to schedulers to set up on ~ 6-23 and for them to contact patient. Called Dr Shelba Flake office and LM that this office will set up appointment, also requested reports of scans, path, labs as initial fax had only her office note from 11-04-14 with cover letter.  Dr Polly Cobia has recommended "3 cycles of weekly taxol/carboplatin q 28 days", then break for RT to para-aortic region, then 3 additional cycles of chemo. Dr Polly Cobia also to set up apt back to Dr Sondra Come.  Patient had q 3 week carbo taxol as adjuvant treatment, completed 12-13-12.  Godfrey Pick, MD

## 2014-11-13 ENCOUNTER — Telehealth: Payer: Self-pay | Admitting: Oncology

## 2014-11-13 NOTE — Telephone Encounter (Signed)
Patient called to move up her appointment as her bx will be 6/13,done

## 2014-11-17 ENCOUNTER — Telehealth: Payer: Self-pay | Admitting: Oncology

## 2014-11-17 NOTE — Telephone Encounter (Signed)
Called patient and she is aware of her new appointment

## 2014-11-19 ENCOUNTER — Other Ambulatory Visit: Payer: Self-pay | Admitting: Oncology

## 2014-11-20 ENCOUNTER — Telehealth: Payer: Self-pay | Admitting: *Deleted

## 2014-11-20 ENCOUNTER — Telehealth: Payer: Self-pay | Admitting: Oncology

## 2014-11-20 ENCOUNTER — Other Ambulatory Visit (HOSPITAL_BASED_OUTPATIENT_CLINIC_OR_DEPARTMENT_OTHER): Payer: 59

## 2014-11-20 ENCOUNTER — Telehealth: Payer: Self-pay | Admitting: General Practice

## 2014-11-20 ENCOUNTER — Ambulatory Visit (HOSPITAL_BASED_OUTPATIENT_CLINIC_OR_DEPARTMENT_OTHER): Payer: 59 | Admitting: Oncology

## 2014-11-20 ENCOUNTER — Encounter: Payer: Self-pay | Admitting: Oncology

## 2014-11-20 VITALS — BP 120/65 | HR 75 | Temp 97.9°F | Resp 18 | Ht 63.0 in | Wt 140.8 lb

## 2014-11-20 DIAGNOSIS — R946 Abnormal results of thyroid function studies: Secondary | ICD-10-CM | POA: Diagnosis not present

## 2014-11-20 DIAGNOSIS — C541 Malignant neoplasm of endometrium: Secondary | ICD-10-CM

## 2014-11-20 DIAGNOSIS — C7989 Secondary malignant neoplasm of other specified sites: Secondary | ICD-10-CM | POA: Insufficient documentation

## 2014-11-20 DIAGNOSIS — C772 Secondary and unspecified malignant neoplasm of intra-abdominal lymph nodes: Secondary | ICD-10-CM

## 2014-11-20 LAB — CBC WITH DIFFERENTIAL/PLATELET
BASO%: 1.4 % (ref 0.0–2.0)
Basophils Absolute: 0.1 10*3/uL (ref 0.0–0.1)
EOS%: 2.8 % (ref 0.0–7.0)
Eosinophils Absolute: 0.1 10*3/uL (ref 0.0–0.5)
HCT: 36.8 % (ref 34.8–46.6)
HGB: 12.4 g/dL (ref 11.6–15.9)
LYMPH%: 23.9 % (ref 14.0–49.7)
MCH: 31.5 pg (ref 25.1–34.0)
MCHC: 33.7 g/dL (ref 31.5–36.0)
MCV: 93.4 fL (ref 79.5–101.0)
MONO#: 0.2 10*3/uL (ref 0.1–0.9)
MONO%: 5.3 % (ref 0.0–14.0)
NEUT#: 2.8 10*3/uL (ref 1.5–6.5)
NEUT%: 66.6 % (ref 38.4–76.8)
Platelets: 160 10*3/uL (ref 145–400)
RBC: 3.94 10*6/uL (ref 3.70–5.45)
RDW: 12.3 % (ref 11.2–14.5)
WBC: 4.2 10*3/uL (ref 3.9–10.3)
lymph#: 1 10*3/uL (ref 0.9–3.3)

## 2014-11-20 LAB — COMPREHENSIVE METABOLIC PANEL (CC13)
ALT: 16 U/L (ref 0–55)
AST: 20 U/L (ref 5–34)
Albumin: 4 g/dL (ref 3.5–5.0)
Alkaline Phosphatase: 47 U/L (ref 40–150)
Anion Gap: 6 mEq/L (ref 3–11)
BUN: 13.2 mg/dL (ref 7.0–26.0)
CO2: 29 mEq/L (ref 22–29)
Calcium: 9.8 mg/dL (ref 8.4–10.4)
Chloride: 105 mEq/L (ref 98–109)
Creatinine: 0.8 mg/dL (ref 0.6–1.1)
EGFR: 82 mL/min/{1.73_m2} — ABNORMAL LOW (ref >90)
Glucose: 119 mg/dl (ref 70–140)
Potassium: 3.8 mEq/L (ref 3.5–5.1)
Sodium: 140 mEq/L (ref 136–145)
Total Bilirubin: 0.64 mg/dL (ref 0.20–1.20)
Total Protein: 6.7 g/dL (ref 6.4–8.3)

## 2014-11-20 MED ORDER — DEXAMETHASONE 4 MG PO TABS: ORAL_TABLET | ORAL | Status: DC

## 2014-11-20 MED ORDER — LORAZEPAM 1 MG PO TABS: ORAL_TABLET | ORAL | Status: DC

## 2014-11-20 MED ORDER — ONDANSETRON HCL 8 MG PO TABS: 8.0000 mg | ORAL_TABLET | Freq: Three times a day (TID) | ORAL | Status: DC | PRN

## 2014-11-20 NOTE — Telephone Encounter (Signed)
Dr. Shelba Flake office number in [place] is [phone].

## 2014-11-20 NOTE — Progress Notes (Signed)
OFFICE PROGRESS NOTE   November 20, 2014   Physicians:Elizabeth Polly Cobia, MD, Gery Pray MD, [name] MD  INTERVAL HISTORY:  Patient is seen, together with husband, at request of Dr Genia Del, for consideration of chemotherapy for recently diagnosed recurrent endometrial carcinoma. This is the first time that I have seen her since 05-2013. Outside records from Dr Polly Cobia / [facility] reviewed in detail and interval history discussed with patient now.   Patient has been followed closely by Dr Polly Cobia since completing adjuvant taxol carboplatin chemotherapy in 12-2012 thru this facility, with vaginal brachytherapy by Dr Sondra Come additionally. Patient has felt very well, with intentional 70 lb weight loss over a year from Jan thru Dec 2015 on Weight Watchers. She saw Dr Polly Cobia with unremarkable exam in March 2016, however CA 125 was slightly elevated at 36.3, up from previous 29.2. She had CT CAP with contrast 10-16-14 with new left periaortic and left common iliac lymphadenopathy. PET 7-62-83 had hypermetabolic uptake in retroperitoneal nodes from lower poles of kidneys to aortic bifurcation,largest 2.0 x 2.1 cm,  as well as uptake in either right supraclavicular node vs right lobe of thyroid (report below). She had FNA of thyroid 10-28-14 with benign tissue, [place] at time of biopsy reportedly showing no right supraclavicular adenopathy. She had CT needle biopsy of left para-aortic node 11-16-14 with path showing serous carcinoma. Dr Polly Cobia has recommended chemotherapy with weekly carbo taxol x 3 cycles, then radiation, then additional chemotherapy. Patient lives in [place],  works as Therapist, sports in [employer], and has requested chemotherapy and radiation be given here. Consultation with Dr Sondra Come will be set up. She does not presently have return appointment set up with Dr Polly Cobia.    Primary care is by Dr [name], whom she is to see next in ~ August.  Mammograms at [facility] 04-20-14.   Colonoscopy @ [facility] 2008.   No central catheter. Peripheral IV access was adequate for previous chemotherapy and she much prefers this to [facility]. No genetics testing   ONCOLOGIC HISTORY Patient presented to PCP [name] with intermittent vaginal bleeding since ~ Oct 2013. Dr Sabra Heck did endometrial biopsy 06-20-12 which showed complex endometrial hyperplasia with atypia. She had pelvic and transvaginal US in [facility] system on 06-26-12, which showed endometrial thickening of 2.7 cm, right ovary not clearly seen and left ovary with normal postmenopausal appearance. She was seen by Dr Christophe Louis and referred to Dr Polly Cobia. She had robotic hysterectomy with BSO and bilateral pelvic and periaortic node dissection by Dr Polly Cobia at [facility] on 07-30-2012. Intraoperatively she had large endometrial polyp with high grade malignancy, no obvious extrauterine disease. Pathology ([facility] [place] [id number] from 07-30-2012) had high grade, poorly differentiated endometrial adenocarcinoma with solid and serous papillary components, 2 periaortic nodes negative and 9 right pelvic/11 left pelvic nodes negative. The tumor was 4.8 cm confined to polyp with no definite myometrial involvement, margins clear and no angiolymphatic invasion identified.  Cycle 1 taxol carboplatin was given 08-30-12, 6 cycles of q 3 week regimen given thru 12-13-2012, with gCSF support. Radiation was intracavitary HDR to proximal vagina 30 Gy in 5 fractions from May 27 thru November 27, 2012, by Dr Sondra Come.     Review of systems as above, also: Weight has been stable for past several months, still mostly following Weight Watchers. Good energy, good appetite, no nausea or vomiting.. No recent illness. No HA, dental concerns, respiratior symptoms, cardiac symptoms, LE swelling, bleeding. pelivc  discomfort, bladder symptoms.Chronic constipation, improves with diet, unchanged. No residual peripheral neuropathy  from taxol. She has been somewhat anxious about the recurrent cancer. No clear symptoms of thyroid disease; I do not find TFTs in CareEverywhere. Remainder of 10 point Review of Systems negative.  Past Medical/ Surgical History reviewed, no changes.  Social:  DCd tobacco 30 years ago. 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 Since March 2016 she has worked part time in [place], primarily with cardiology; she generally works [employer] and does have flexibility. She does not presently have direct patient contact.  Son married last year and lives in [place]; daughter is in [place]. Husband retired from Manufacturing engineer.  Objective:  Vital signs in last 24 hours:  BP 120/65 mmHg  Pulse 75  Temp(Src) 97.9 F (36.6 C) (Oral)  Resp 18  Ht 5' 3" (1.6 m)  Wt 140 lb 12.8 oz (63.866 kg)  BMI 24.95 kg/m2 Last weight at this office 05-2013 was 210 lbs Alert, oriented and appropriate. Ambulatory without difficulty. Looks comfortable. Very pleasant, excellent historian, husband very supportive.   HEENT:PERRL, sclerae not icteric. Oral mucosa moist without lesions, posterior pharynx clear.  Neck supple. No JVD.  Lymphatics:no cervical,supraclavicular, axillary or inguinal adenopathy Resp: clear to auscultation bilaterally and normal percussion bilaterally Cardio: regular rate and rhythm. No gallop. GI: abdomen soft, nontender, not distended, no mass or organomegaly. Normally active bowel sounds. Surgical incision not remarkable. Musculoskeletal/ Extremities: without pitting edema, cords, tenderness Neuro: no peripheral neuropathy. Otherwise nonfocal. Psych appropriate mood and affect Skin without rash, ecchymosis, petechiae Breasts: bilaterally with some irregularities but no dominant mass, skin or nipple findings. Axillae benign.   Lab Results:  Results for orders placed or  performed in visit on 11/20/14  CBC with Differential  Result Value Ref Range   WBC 4.2 3.9 - 10.3 10e3/uL   NEUT# 2.8 1.5 - 6.5 10e3/uL   HGB 12.4 11.6 - 15.9 g/dL   HCT 36.8 34.8 - 46.6 %   Platelets 160 145 - 400 10e3/uL   MCV 93.4 79.5 - 101.0 fL   MCH 31.5 25.1 - 34.0 pg   MCHC 33.7 31.5 - 36.0 g/dL   RBC 3.94 3.70 - 5.45 10e6/uL   RDW 12.3 11.2 - 14.5 %   lymph# 1.0 0.9 - 3.3 10e3/uL   MONO# 0.2 0.1 - 0.9 10e3/uL   Eosinophils Absolute 0.1 0.0 - 0.5 10e3/uL   Basophils Absolute 0.1 0.0 - 0.1 10e3/uL   NEUT% 66.6 38.4 - 76.8 %   LYMPH% 23.9 14.0 - 49.7 %   MONO% 5.3 0.0 - 14.0 %   EOS% 2.8 0.0 - 7.0 %   BASO% 1.4 0.0 - 2.0 %  Comprehensive metabolic panel (Cmet) - [facility]  Result Value Ref Range   Sodium 140 136 - 145 mEq/L   Potassium 3.8 3.5 - 5.1 mEq/L   Chloride 105 98 - 109 mEq/L   CO2 29 22 - 29 mEq/L   Glucose 119 70 - 140 mg/dl   BUN 13.2 7.0 - 26.0 mg/dL   Creatinine 0.8 0.6 - 1.1 mg/dL   Total Bilirubin 0.64 0.20 - 1.20 mg/dL   Alkaline Phosphatase 47 40 - 150 U/L   AST 20 5 - 34 U/L   ALT 16 0 - 55 U/L   Total Protein 6.7 6.4 - 8.3 g/dL   Albumin 4.0 3.5 - 5.0 g/dL  Calcium 9.8 8.4 - 10.4 mg/dL   Anion Gap 6 3 - 11 mEq/L   EGFR 82 (L) >90 ml/min/1.73 m2    WIll check TFTs with next labs and will repeat CA 125 for chemo baseline.  Studies/Results:  CT CAP 10-16-14  in [facility] system, which I am unable to access thru [facility] now, reportedly showed new lymphadenopathy in left periaortic and left common iliac regions.   PET CT Skull Base To Thighs Initial (10/20/2014 2:23 PM)  Impressions  IMPRESSION:  1. Hypermetabolic right supraclavicular node versus right thyroid nodule. Consider ultrasound/biopsy for further evaluation.  2. Hypermetabolic abdominal retroperitoneal adenopathy      PET CT Skull Base To Thighs Initial (10/20/2014 2:23 PM)  Narrative  INDICATION: C54.1: Malignant neoplasm of endometrium    TECHNIQUE: 7.2 millicuries of  G-81 FDG was administered intravenously. PET imaging was obtained from the skull base to the mid thighs. CT images were obtained for attenuation correction and localization purposes. Glucose level was 102 MG/DL. Time from   injection to scan was 60 minutes. Comparison none.    FINDINGS: Hypermetabolic right thyroid nodule versus right supraclavicular node. This is seen on image 61 measuring 22 x 15 mm with an SUV Max of 6.0.    Hypermetabolic retroperitoneal abnormal nodes extending from the lower pole of the kidneys down to the aortic bifurcation. The largest of these is adjacent to the distal aorta on image 167 measuring 20 x 21 mm with an SUV Max of 6.9.       PATHOLOGY [id number] 11-16-14     DIAGNOSIS     LYMPH NODE, LEFT PARA-AORTIC, BIOPSY:   METASTATIC CARCINOMA, CONSISTENT WITH METASTATIC HIGH GRADE SEROUS   CARCINOMA.   (APH)     COMMENT   ONLY VERY SMALL AGGREGATES OF MALIGNANT CELLS ARE PRESENT, BUT   IMMUNOHISTOCHEMICALLY, THEIR STAINING PATTERN IS SIMILAR TO THAT NOTED   PREVIOUSLY ([id number]):THEY ARE POSITIVE FOR CYTOKERATIN AE1/AE3,   POSITIVE FOR P53, AND POSITIVE FOR WT1.THE OVERALL MORPHOLOGY AND   IMMUNOHISTOCHEMICAL PATTERN ARE CONSISTENT WITH METASTATIC HIGH GRADE   SEROSA ADENOCARCINOMA.         Rickard Patience MD   PATHOLOGIST   (CASE SIGNED 11/17/2014)     CLINICAL INFORMATION     LYMPH NODE BX,PT HAS OF ENDOMETRIAL CA.     SPECIMEN   LYMPH NODE, LT PARA AORTIC -NEEDLE CORE BIOPSY    Performed by: [facility]  [address] [place], [place], [place]      Non-Gyn Cytology Final Report (10/28/2014)  Narrative  [phone]      DIAGNOSIS     THYROID RIGHT LOBE, FINE NEEDLE ASPIRATION:   BENIGN THYROID PARENCHYMA   MOST CONSISTENT WITH HYPERPLASTIC THYROID NODULE.   NO TUMOR IDENTIFIED   (CAT)         Vaughan Basta MD   PATHOLOGIST    (CASE SIGNED 10/29/2014)     CLINICAL INFORMATION     RIGHT SIDE THYROID ENLARGEMENT NO DISTINCT NODULE.HISTORY OF   ENDOMETRIAL CANCER     SPECIMEN   THYROID, RT-FINE NEEDLE ASPIRATE      Pharmacy: [facility]   Medications: I have reviewed the patient's current medications. Prescriptions to pharmacy: zofran, ativan, decadron  DISCUSSION: interval history reviewed with patient as above. She and husband understand that treatment will be in attempt to improve and control the disease, tho we do not expect this to be curative, but she may do well enough with treatment  that she can be followed on treatment breaks with close observation hopefully for extended periods of time. We have discussed rationale for using taxol and carboplatin again, as it has been almost 2 years since completion of initial therapy. We have discussed dose dense regimen with these agents, as Dr Polly Cobia recommends, which certainly is very reasonable and likely will be well tolerated. We have discussed possible carboplatin allergic reactions with >6 cycles, and carbo skin testing, peripheral IV access, taxol neuropathy, decadron premedication (20 mg 12 hrs prior to taxol), timing of treatments with her work schedule. We have discussed radiation in "sandwich" fashion with the chemotherapy. Antiemetics reviewed.      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 Plan dose dense carbo taxol beginning 11-27-14. Referral back to Dr Sondra Come for consultation, possible radiation after 3 cycles chemo. 2.some diffuse uptake right thyroid on PET, no specific nodule  on [place] and FNA benign. Will get TFTs with next labs here and let Dr [name] know 3.intentional weight loss of 70 lbs over the 2015 calendar year. BMI now 25 4.chronic constipation, which was a concern during last chemo. Up to date on colonoscopy. May need laxative/ stool softener added 5.HTN, tho has decreased BP meds with weight loss 6.elevated lipids 7.remote minimal tobacco, not enough to qualify for the lung cancer screening program 8.up to date mammograms 9.melanoma in brother    Verbal consent obtained for the chemotherapy. Chemo orders entered. Preauthorization requested for chemo and for granix. All questions answered and patient knows to call if any concerns prior to next scheduled visit. Cc Drs Polly Cobia, Verner Chol, MD   11/20/2014, 4:00 PM

## 2014-11-20 NOTE — Telephone Encounter (Signed)
Added infusion for louise/livesay for 6.24.

## 2014-11-20 NOTE — Telephone Encounter (Signed)
Appointments made and avs printed for patient °

## 2014-11-20 NOTE — Telephone Encounter (Signed)
Per staff phone call and POF I have schedueld appts. Scheduler advised of appts.  JMW  

## 2014-11-23 ENCOUNTER — Ambulatory Visit: Payer: 59 | Admitting: Oncology

## 2014-11-23 ENCOUNTER — Other Ambulatory Visit: Payer: 59

## 2014-11-26 ENCOUNTER — Other Ambulatory Visit: Payer: Self-pay | Admitting: Oncology

## 2014-11-26 ENCOUNTER — Ambulatory Visit: Payer: 59 | Admitting: Oncology

## 2014-11-26 ENCOUNTER — Other Ambulatory Visit: Payer: 59

## 2014-11-27 ENCOUNTER — Other Ambulatory Visit (HOSPITAL_BASED_OUTPATIENT_CLINIC_OR_DEPARTMENT_OTHER): Payer: 59

## 2014-11-27 ENCOUNTER — Other Ambulatory Visit: Payer: Self-pay | Admitting: Oncology

## 2014-11-27 ENCOUNTER — Ambulatory Visit (HOSPITAL_BASED_OUTPATIENT_CLINIC_OR_DEPARTMENT_OTHER): Payer: 59

## 2014-11-27 ENCOUNTER — Encounter: Payer: Self-pay | Admitting: Oncology

## 2014-11-27 VITALS — BP 115/62 | HR 79 | Temp 98.3°F | Resp 18

## 2014-11-27 DIAGNOSIS — C541 Malignant neoplasm of endometrium: Secondary | ICD-10-CM | POA: Diagnosis not present

## 2014-11-27 DIAGNOSIS — Z5111 Encounter for antineoplastic chemotherapy: Secondary | ICD-10-CM | POA: Diagnosis not present

## 2014-11-27 DIAGNOSIS — R946 Abnormal results of thyroid function studies: Secondary | ICD-10-CM

## 2014-11-27 DIAGNOSIS — C772 Secondary and unspecified malignant neoplasm of intra-abdominal lymph nodes: Secondary | ICD-10-CM

## 2014-11-27 LAB — CBC WITH DIFFERENTIAL/PLATELET
BASO%: 0.3 % (ref 0.0–2.0)
Basophils Absolute: 0 10*3/uL (ref 0.0–0.1)
EOS%: 0.3 % (ref 0.0–7.0)
Eosinophils Absolute: 0 10*3/uL (ref 0.0–0.5)
HCT: 36.3 % (ref 34.8–46.6)
HGB: 12.4 g/dL (ref 11.6–15.9)
LYMPH%: 14.8 % (ref 14.0–49.7)
MCH: 32 pg (ref 25.1–34.0)
MCHC: 34.2 g/dL (ref 31.5–36.0)
MCV: 93.8 fL (ref 79.5–101.0)
MONO#: 0 10*3/uL — ABNORMAL LOW (ref 0.1–0.9)
MONO%: 0.7 % (ref 0.0–14.0)
NEUT#: 2.4 10*3/uL (ref 1.5–6.5)
NEUT%: 83.9 % — ABNORMAL HIGH (ref 38.4–76.8)
Platelets: 162 10*3/uL (ref 145–400)
RBC: 3.87 10*6/uL (ref 3.70–5.45)
RDW: 12.2 % (ref 11.2–14.5)
WBC: 2.9 10*3/uL — ABNORMAL LOW (ref 3.9–10.3)
lymph#: 0.4 10*3/uL — ABNORMAL LOW (ref 0.9–3.3)
nRBC: 0 % (ref 0–0)

## 2014-11-27 LAB — COMPREHENSIVE METABOLIC PANEL (CC13)
ALT: 14 U/L (ref 0–55)
AST: 16 U/L (ref 5–34)
Albumin: 3.9 g/dL (ref 3.5–5.0)
Alkaline Phosphatase: 47 U/L (ref 40–150)
Anion Gap: 9 mEq/L (ref 3–11)
BUN: 18 mg/dL (ref 7.0–26.0)
CO2: 23 mEq/L (ref 22–29)
Calcium: 9.4 mg/dL (ref 8.4–10.4)
Chloride: 103 mEq/L (ref 98–109)
Creatinine: 0.8 mg/dL (ref 0.6–1.1)
EGFR: 86 mL/min/{1.73_m2} — ABNORMAL LOW (ref >90)
Glucose: 191 mg/dl — ABNORMAL HIGH (ref 70–140)
Potassium: 4 mEq/L (ref 3.5–5.1)
Sodium: 136 mEq/L (ref 136–145)
Total Bilirubin: 0.4 mg/dL (ref 0.20–1.20)
Total Protein: 6.8 g/dL (ref 6.4–8.3)

## 2014-11-27 LAB — TSH CHCC: TSH: 0.516 m(IU)/L (ref 0.308–3.960)

## 2014-11-27 MED ORDER — FAMOTIDINE IN NACL 20-0.9 MG/50ML-% IV SOLN
INTRAVENOUS | Status: AC
  Filled 2014-11-27: qty 50

## 2014-11-27 MED ORDER — PACLITAXEL CHEMO INJECTION 300 MG/50ML
80.0000 mg/m2 | Freq: Once | INTRAVENOUS | Status: AC
  Administered 2014-11-27: 138 mg via INTRAVENOUS
  Filled 2014-11-27: qty 23

## 2014-11-27 MED ORDER — DIPHENHYDRAMINE HCL 50 MG/ML IJ SOLN
INTRAMUSCULAR | Status: AC
  Filled 2014-11-27: qty 1

## 2014-11-27 MED ORDER — SODIUM CHLORIDE 0.9 % IV SOLN
Freq: Once | INTRAVENOUS | Status: AC
  Administered 2014-11-27: 13:00:00 via INTRAVENOUS
  Filled 2014-11-27: qty 5

## 2014-11-27 MED ORDER — FAMOTIDINE IN NACL 20-0.9 MG/50ML-% IV SOLN
20.0000 mg | Freq: Once | INTRAVENOUS | Status: AC
  Administered 2014-11-27: 20 mg via INTRAVENOUS

## 2014-11-27 MED ORDER — DIPHENHYDRAMINE HCL 50 MG/ML IJ SOLN
50.0000 mg | Freq: Once | INTRAMUSCULAR | Status: AC
  Administered 2014-11-27: 50 mg via INTRAVENOUS

## 2014-11-27 MED ORDER — CARBOPLATIN CHEMO INTRADERMAL TEST DOSE 100MCG/0.02ML
100.0000 ug | Freq: Once | INTRADERMAL | Status: AC
  Administered 2014-11-27: 100 ug via INTRADERMAL
  Filled 2014-11-27: qty 0.01

## 2014-11-27 MED ORDER — SODIUM CHLORIDE 0.9 % IV SOLN
Freq: Once | INTRAVENOUS | Status: AC
  Administered 2014-11-27: 13:00:00 via INTRAVENOUS
  Filled 2014-11-27: qty 8

## 2014-11-27 MED ORDER — SODIUM CHLORIDE 0.9 % IV SOLN
497.5000 mg | Freq: Once | INTRAVENOUS | Status: AC
  Administered 2014-11-27: 500 mg via INTRAVENOUS
  Filled 2014-11-27: qty 50

## 2014-11-27 MED ORDER — SODIUM CHLORIDE 0.9 % IV SOLN
Freq: Once | INTRAVENOUS | Status: AC
  Administered 2014-11-27: 12:00:00 via INTRAVENOUS

## 2014-11-27 NOTE — Patient Instructions (Signed)
[facility] Discharge Instructions for Patients Receiving Chemotherapy  Today you received the following chemotherapy agents: Taxol and Carboplatin.  To help prevent nausea and vomiting after your treatment, we encourage you to take your nausea medication: Zofran. Take one every 8 hours as needed. You may take Ativan every 6 hours as needed. This will make you drowsy.    If you develop nausea and vomiting that is not controlled by your nausea medication, call the clinic.   BELOW ARE SYMPTOMS THAT SHOULD BE REPORTED IMMEDIATELY:  *FEVER GREATER THAN 100.5 F  *CHILLS WITH OR WITHOUT FEVER  NAUSEA AND VOMITING THAT IS NOT CONTROLLED WITH YOUR NAUSEA MEDICATION  *UNUSUAL SHORTNESS OF BREATH  *UNUSUAL BRUISING OR BLEEDING  TENDERNESS IN MOUTH AND THROAT WITH OR WITHOUT PRESENCE OF ULCERS  *URINARY PROBLEMS  *BOWEL PROBLEMS  UNUSUAL RASH Items with * indicate a potential emergency and should be followed up as soon as possible.  Feel free to call the clinic should you have any questions or concerns. The clinic phone number is [phone].  Please show the [facility] at check-in to the Emergency Department and triage nurse.

## 2014-11-27 NOTE — Progress Notes (Signed)
Medical Oncology  Per pharmacy, EMEND used with chemo previously. Preauth requested and obtained on 11-26-14.   L.Livesay

## 2014-11-28 LAB — CA 125: CA 125: 132 U/mL — ABNORMAL HIGH (ref <35)

## 2014-11-28 LAB — T3 UPTAKE: T3 Uptake: 28 % (ref 22–35)

## 2014-11-28 LAB — T4: T4, Total: 7 ug/dL (ref 4.5–12.0)

## 2014-11-29 ENCOUNTER — Other Ambulatory Visit: Payer: Self-pay | Admitting: Oncology

## 2014-12-03 ENCOUNTER — Ambulatory Visit (HOSPITAL_BASED_OUTPATIENT_CLINIC_OR_DEPARTMENT_OTHER): Payer: 59 | Admitting: Oncology

## 2014-12-03 ENCOUNTER — Telehealth: Payer: Self-pay | Admitting: Oncology

## 2014-12-03 ENCOUNTER — Encounter: Payer: Self-pay | Admitting: Oncology

## 2014-12-03 ENCOUNTER — Other Ambulatory Visit (HOSPITAL_BASED_OUTPATIENT_CLINIC_OR_DEPARTMENT_OTHER): Payer: 59

## 2014-12-03 ENCOUNTER — Ambulatory Visit: Payer: 59 | Admitting: Oncology

## 2014-12-03 ENCOUNTER — Ambulatory Visit: Payer: 59

## 2014-12-03 ENCOUNTER — Other Ambulatory Visit: Payer: 59

## 2014-12-03 VITALS — BP 111/63 | HR 78 | Temp 98.0°F | Resp 18 | Ht 63.0 in | Wt 137.6 lb

## 2014-12-03 DIAGNOSIS — R112 Nausea with vomiting, unspecified: Secondary | ICD-10-CM

## 2014-12-03 DIAGNOSIS — D72819 Decreased white blood cell count, unspecified: Secondary | ICD-10-CM

## 2014-12-03 DIAGNOSIS — T451X5A Adverse effect of antineoplastic and immunosuppressive drugs, initial encounter: Secondary | ICD-10-CM

## 2014-12-03 DIAGNOSIS — C541 Malignant neoplasm of endometrium: Secondary | ICD-10-CM | POA: Diagnosis not present

## 2014-12-03 DIAGNOSIS — C772 Secondary and unspecified malignant neoplasm of intra-abdominal lymph nodes: Secondary | ICD-10-CM | POA: Diagnosis not present

## 2014-12-03 DIAGNOSIS — I10 Essential (primary) hypertension: Secondary | ICD-10-CM

## 2014-12-03 DIAGNOSIS — D701 Agranulocytosis secondary to cancer chemotherapy: Secondary | ICD-10-CM

## 2014-12-03 LAB — CBC WITH DIFFERENTIAL/PLATELET
BASO%: 0.7 % (ref 0.0–2.0)
Basophils Absolute: 0 10*3/uL (ref 0.0–0.1)
EOS%: 0.2 % (ref 0.0–7.0)
Eosinophils Absolute: 0 10*3/uL (ref 0.0–0.5)
HCT: 36.1 % (ref 34.8–46.6)
HGB: 12.4 g/dL (ref 11.6–15.9)
LYMPH%: 19.4 % (ref 14.0–49.7)
MCH: 31.9 pg (ref 25.1–34.0)
MCHC: 34.2 g/dL (ref 31.5–36.0)
MCV: 93.3 fL (ref 79.5–101.0)
MONO#: 0 10*3/uL — ABNORMAL LOW (ref 0.1–0.9)
MONO%: 1.1 % (ref 0.0–14.0)
NEUT#: 1.1 10*3/uL — ABNORMAL LOW (ref 1.5–6.5)
NEUT%: 78.6 % — ABNORMAL HIGH (ref 38.4–76.8)
Platelets: 135 10*3/uL — ABNORMAL LOW (ref 145–400)
RBC: 3.87 10*6/uL (ref 3.70–5.45)
RDW: 12.1 % (ref 11.2–14.5)
WBC: 1.4 10*3/uL — ABNORMAL LOW (ref 3.9–10.3)
lymph#: 0.3 10*3/uL — ABNORMAL LOW (ref 0.9–3.3)

## 2014-12-03 LAB — COMPREHENSIVE METABOLIC PANEL (CC13)
ALT: 20 U/L (ref 0–55)
AST: 18 U/L (ref 5–34)
Albumin: 3.9 g/dL (ref 3.5–5.0)
Alkaline Phosphatase: 53 U/L (ref 40–150)
Anion Gap: 11 mEq/L (ref 3–11)
BUN: 20.6 mg/dL (ref 7.0–26.0)
CO2: 24 mEq/L (ref 22–29)
Calcium: 9.9 mg/dL (ref 8.4–10.4)
Chloride: 103 mEq/L (ref 98–109)
Creatinine: 0.8 mg/dL (ref 0.6–1.1)
EGFR: 82 mL/min/{1.73_m2} — ABNORMAL LOW (ref >90)
Glucose: 189 mg/dl — ABNORMAL HIGH (ref 70–140)
Potassium: 4 mEq/L (ref 3.5–5.1)
Sodium: 138 mEq/L (ref 136–145)
Total Bilirubin: 0.55 mg/dL (ref 0.20–1.20)
Total Protein: 6.8 g/dL (ref 6.4–8.3)

## 2014-12-03 MED ORDER — TBO-FILGRASTIM 300 MCG/0.5ML ~~LOC~~ SOSY
300.0000 ug | PREFILLED_SYRINGE | Freq: Once | SUBCUTANEOUS | Status: AC
  Administered 2014-12-03: 300 ug via SUBCUTANEOUS
  Filled 2014-12-03: qty 0.5

## 2014-12-03 NOTE — Telephone Encounter (Signed)
Appointments made and avs printed for patient °

## 2014-12-03 NOTE — Progress Notes (Signed)
OFFICE PROGRESS NOTE   December 03, 2014   Physicians:Elizabeth Polly Cobia, MD, Gery Pray MD, [name] MD  INTERVAL HISTORY:  Patient is seen, alone for visit, chemotherapy begun 11-27-14 with day 1 cycle 1 dose dense carbo taxol for recurrent endometrial carcinoma. Day 8 cycle 1 will be delayed today with ANC of 1.1 now, and will add granix beginning today.  Patient premedicated taxol with decadron 20 mg 12 hrs prior; peripheral IV access was easily accomplished. She received EMEND with chemo, did not have additional IVF after treatment. She was fatigued on day 2, then nausea and fatigue to point that she had to leave church on day 3. Zofran and ativan have been somewhat helpful and she is pushing po fluids. Nausea has been more bothersome in AMs, was able to go to work half day in afternoon. Bowels are moving well with the increased fluids and prn Senokot S.She also stopped coffee around start of chemo, previously 3-4 cups/day.     No central catheter. Peripheral IV access was adequate for previous chemotherapy and she much prefers this to [facility]. No genetics testing CA 125 on 11-27-14 was 132   ONCOLOGIC HISTORY Patient presented to PCP [name] with intermittent vaginal bleeding since ~ Oct 2013. Dr Sabra Heck did endometrial biopsy 06-20-12 which showed complex endometrial hyperplasia with atypia. She had pelvic and transvaginal US in [facility] system on 06-26-12, which showed endometrial thickening of 2.7 cm, right ovary not clearly seen and left ovary with normal postmenopausal appearance. She was seen by Dr Christophe Louis and referred to Dr Polly Cobia. She had robotic hysterectomy with BSO and bilateral pelvic and periaortic node dissection by Dr Polly Cobia at [facility] on 07-30-2012. Intraoperatively she had large endometrial polyp with high grade malignancy, no obvious extrauterine disease. Pathology ([facility] [place] [id number] from 07-30-2012) had high grade,  poorly differentiated endometrial adenocarcinoma with solid and serous papillary components, 2 periaortic nodes negative and 9 right pelvic/11 left pelvic nodes negative. The tumor was 4.8 cm confined to polyp with no definite myometrial involvement, margins clear and no angiolymphatic invasion identified. Six cycles of adjuvant q 3 week carboplatin taxol was given at [name] from 08-30-12  thru 12-13-2012, with gCSF support. Radiation was intracavitary HDR to proximal vagina 30 Gy in 5 fractions from May 27 thru November 27, 2012, by Dr Sondra Come. Patient was followed closely by Dr Polly Cobia since completing adjuvant therapy. Patient felt very well, with intentional 70 lb weight loss over  year of 2015 on Weight Watchers. She saw Dr Polly Cobia with unremarkable exam in March 2016, however CA 125 was slightly elevated at 36.3, up from previous 29.2. She had CT CAP with contrast 10-16-14 with new left periaortic and left common iliac lymphadenopathy. PET 0-62-37 had hypermetabolic uptake in retroperitoneal nodes from lower poles of kidneys to aortic bifurcation,largest 2.0 x 2.1 cm, as well as uptake in either right supraclavicular node vs right lobe of thyroid (report below). She had FNA of thyroid 10-28-14 with benign tissue, [place] at time of biopsy reportedly showing no right supraclavicular adenopathy. She had CT needle biopsy of left para-aortic node 11-16-14 with path showing serous carcinoma. Dr Polly Cobia recommended chemotherapy with weekly carbo taxol x 3 cycles, then radiation, then additional chemotherapy. Day 1 cycle 1 dose dense carbo taxol was 11-27-14.   Review of systems as above, also: No significant taxol aches and no increased peripheral neuropathy. No fever or symptoms of infection. Voiding ok. No bleeding. No other pain. Remainder of  10 point Review of Systems negative.  Objective:  Vital signs in last 24 hours:  BP 111/63 mmHg  Pulse 78  Temp(Src) 98 F (36.7 C) (Oral)  Resp 18  Ht 5' 3"  (1.6 m)  Wt  137 lb 9.6 oz (62.415 kg)  BMI 24.38 kg/m2  SpO2 100% Weight down 3 lbs. Alert, oriented and appropriate. Ambulatory without difficulty. Looks somewhat tired but NAD No alopecia  HEENT:PERRL, sclerae not icteric. Oral mucosa moist without lesions, posterior pharynx clear.  Neck supple. No JVD.  Lymphatics:no cervical,supraclavicular adenopathy Resp: clear to auscultation bilaterally and normal percussion bilaterally Cardio: regular rate and rhythm. No gallop. GI: soft, nontender, not distended, no mass or organomegaly. Some bowel sounds. Surgical incision not remarkable. Musculoskeletal/ Extremities: without pitting edema, cords, tenderness Neuro: no peripheral neuropathy. Otherwise nonfocal. Psych appropriate mood and affect Skin without rash, ecchymosis, petechiae   Lab Results:  Results for orders placed or performed in visit on 12/03/14  CBC with Differential  Result Value Ref Range   WBC 1.4 (L) 3.9 - 10.3 10e3/uL   NEUT# 1.1 (L) 1.5 - 6.5 10e3/uL   HGB 12.4 11.6 - 15.9 g/dL   HCT 36.1 34.8 - 46.6 %   Platelets 135 (L) 145 - 400 10e3/uL   MCV 93.3 79.5 - 101.0 fL   MCH 31.9 25.1 - 34.0 pg   MCHC 34.2 31.5 - 36.0 g/dL   RBC 3.87 3.70 - 5.45 10e6/uL   RDW 12.1 11.2 - 14.5 %   lymph# 0.3 (L) 0.9 - 3.3 10e3/uL   MONO# 0.0 (L) 0.1 - 0.9 10e3/uL   Eosinophils Absolute 0.0 0.0 - 0.5 10e3/uL   Basophils Absolute 0.0 0.0 - 0.1 10e3/uL   NEUT% 78.6 (H) 38.4 - 76.8 %   LYMPH% 19.4 14.0 - 49.7 %   MONO% 1.1 0.0 - 14.0 %   EOS% 0.2 0.0 - 7.0 %   BASO% 0.7 0.0 - 2.0 %  Comprehensive metabolic panel (Cmet) - [facility]  Result Value Ref Range   Sodium 138 136 - 145 mEq/L   Potassium 4.0 3.5 - 5.1 mEq/L   Chloride 103 98 - 109 mEq/L   CO2 24 22 - 29 mEq/L   Glucose 189 (H) 70 - 140 mg/dl   BUN 20.6 7.0 - 26.0 mg/dL   Creatinine 0.8 0.6 - 1.1 mg/dL   Total Bilirubin 0.55 0.20 - 1.20 mg/dL   Alkaline Phosphatase 53 40 - 150 U/L   AST 18 5 - 34 U/L   ALT 20 0 - 55 U/L   Total  Protein 6.8 6.4 - 8.3 g/dL   Albumin 3.9 3.5 - 5.0 g/dL   Calcium 9.9 8.4 - 10.4 mg/dL   Anion Gap 11 3 - 11 mEq/L   EGFR 82 (L) >90 ml/min/1.73 m2    TFTs 11-27-14 all normal:  TSH 0.516, T3U 28, T4 7.0  CA 125 on 11-27-14 was 132  Studies/Results:  No results found.  Medications: I have reviewed the patient's current medications. WIll have granix today and 7-1, then will use this days 2,3,9,16. IVF on day 2 may also be helpful. Note she took premed decadron last pm  DISCUSSION: events of the past week reviewed as above. She understands rationale for delaying chemo today and adding gCSF. Will treat "day 8" cycle 1 on 12-10-14 (moving treatments to Thursdays) as long as [place] >=1.5 and plt >=100k, MD to see also that day.  Assessment/Plan:  1.recurrent high grade serous endometrial carcinoma involving para-aortic and  common iliac nodes: ~ 22 months from completion of adjuvant chemotherapy for IA poorly differentiated endometrial adenocarcinoma with papillary serous component at R0 surgery by Dr Polly Cobia 07-30-2012, followed by 6 cycles of carboplatin and taxol from 08-30-12 thru 12-13-12 and vaginal brachytherapy. Dose dense carbo taxol begun 11-27-14, delay day 8 cycle 1 for one week with ANC 1.1 now, add granix (2 doses now then days 2,3,9,16). Needs referral back to Dr Sondra Come for possible radiation after 3 cycles chemo. 2.some diffuse uptake right thyroid on PET, no specific nodule on [place] and FNA benign.  TFTs WNL 11-27-14, will let Dr [name] know 3.intentional weight loss of 70 lbs over the 2015 calendar year. BMI now 25 4.chronic constipation, which was a concern during last chemo. Up to date on colonoscopy. Doing ok now 5.HTN, tho has decreased BP meds with weight loss 6.elevated lipids 7.remote minimal tobacco, not enough to qualify for the lung cancer screening program 8.up to date mammograms 9.melanoma in brother  Chemo orders adjusted, granix added. All questions answered. CC Drs  Polly Cobia and Sabra Heck. Patient knows to call if concerns prior to next scheduled visit.TIme spent 25 min including >50% counseling and coordination of care.    Clarann Helvey P, MD   12/03/2014, 1:37 PM

## 2014-12-03 NOTE — Progress Notes (Signed)
Opened in error.  Today's treatment cancelled.

## 2014-12-04 ENCOUNTER — Ambulatory Visit (HOSPITAL_BASED_OUTPATIENT_CLINIC_OR_DEPARTMENT_OTHER): Payer: 59

## 2014-12-04 VITALS — BP 106/88 | HR 91 | Temp 98.3°F

## 2014-12-04 DIAGNOSIS — C541 Malignant neoplasm of endometrium: Secondary | ICD-10-CM | POA: Diagnosis not present

## 2014-12-04 DIAGNOSIS — Z5189 Encounter for other specified aftercare: Secondary | ICD-10-CM | POA: Diagnosis not present

## 2014-12-04 DIAGNOSIS — C772 Secondary and unspecified malignant neoplasm of intra-abdominal lymph nodes: Secondary | ICD-10-CM | POA: Diagnosis not present

## 2014-12-04 MED ORDER — TBO-FILGRASTIM 300 MCG/0.5ML ~~LOC~~ SOSY
300.0000 ug | PREFILLED_SYRINGE | Freq: Once | SUBCUTANEOUS | Status: AC
  Administered 2014-12-04: 300 ug via SUBCUTANEOUS
  Filled 2014-12-04: qty 0.5

## 2014-12-04 NOTE — Patient Instructions (Signed)

## 2014-12-05 DIAGNOSIS — D701 Agranulocytosis secondary to cancer chemotherapy: Secondary | ICD-10-CM | POA: Insufficient documentation

## 2014-12-05 DIAGNOSIS — T451X5A Adverse effect of antineoplastic and immunosuppressive drugs, initial encounter: Secondary | ICD-10-CM | POA: Insufficient documentation

## 2014-12-05 DIAGNOSIS — R112 Nausea with vomiting, unspecified: Secondary | ICD-10-CM | POA: Insufficient documentation

## 2014-12-07 ENCOUNTER — Other Ambulatory Visit: Payer: Self-pay | Admitting: Oncology

## 2014-12-10 ENCOUNTER — Encounter: Payer: Self-pay | Admitting: Oncology

## 2014-12-10 ENCOUNTER — Other Ambulatory Visit (HOSPITAL_BASED_OUTPATIENT_CLINIC_OR_DEPARTMENT_OTHER): Payer: 59

## 2014-12-10 ENCOUNTER — Ambulatory Visit (HOSPITAL_BASED_OUTPATIENT_CLINIC_OR_DEPARTMENT_OTHER): Payer: 59 | Admitting: Oncology

## 2014-12-10 ENCOUNTER — Telehealth: Payer: Self-pay | Admitting: *Deleted

## 2014-12-10 ENCOUNTER — Ambulatory Visit: Payer: 59

## 2014-12-10 VITALS — BP 108/59 | HR 89 | Temp 98.0°F | Resp 18 | Ht 63.0 in | Wt 140.7 lb

## 2014-12-10 DIAGNOSIS — C541 Malignant neoplasm of endometrium: Secondary | ICD-10-CM | POA: Diagnosis not present

## 2014-12-10 DIAGNOSIS — C772 Secondary and unspecified malignant neoplasm of intra-abdominal lymph nodes: Secondary | ICD-10-CM

## 2014-12-10 DIAGNOSIS — R946 Abnormal results of thyroid function studies: Secondary | ICD-10-CM | POA: Diagnosis not present

## 2014-12-10 DIAGNOSIS — D702 Other drug-induced agranulocytosis: Secondary | ICD-10-CM | POA: Diagnosis not present

## 2014-12-10 DIAGNOSIS — T451X5A Adverse effect of antineoplastic and immunosuppressive drugs, initial encounter: Secondary | ICD-10-CM

## 2014-12-10 DIAGNOSIS — D701 Agranulocytosis secondary to cancer chemotherapy: Secondary | ICD-10-CM

## 2014-12-10 LAB — CBC WITH DIFFERENTIAL/PLATELET
BASO%: 0.3 % (ref 0.0–2.0)
Basophils Absolute: 0 10*3/uL (ref 0.0–0.1)
EOS%: 0 % (ref 0.0–7.0)
Eosinophils Absolute: 0 10*3/uL (ref 0.0–0.5)
HCT: 33.8 % — ABNORMAL LOW (ref 34.8–46.6)
HGB: 11.6 g/dL (ref 11.6–15.9)
LYMPH%: 20.9 % (ref 14.0–49.7)
MCH: 32.3 pg (ref 25.1–34.0)
MCHC: 34.3 g/dL (ref 31.5–36.0)
MCV: 94.2 fL (ref 79.5–101.0)
MONO#: 0 10*3/uL — ABNORMAL LOW (ref 0.1–0.9)
MONO%: 1.8 % (ref 0.0–14.0)
NEUT#: 1.2 10*3/uL — ABNORMAL LOW (ref 1.5–6.5)
NEUT%: 77 % — ABNORMAL HIGH (ref 38.4–76.8)
Platelets: 163 10*3/uL (ref 145–400)
RBC: 3.59 10*6/uL — ABNORMAL LOW (ref 3.70–5.45)
RDW: 12.2 % (ref 11.2–14.5)
WBC: 1.5 10*3/uL — ABNORMAL LOW (ref 3.9–10.3)
lymph#: 0.3 10*3/uL — ABNORMAL LOW (ref 0.9–3.3)

## 2014-12-10 LAB — COMPREHENSIVE METABOLIC PANEL (CC13)
ALT: 25 U/L (ref 0–55)
AST: 19 U/L (ref 5–34)
Albumin: 3.9 g/dL (ref 3.5–5.0)
Alkaline Phosphatase: 57 U/L (ref 40–150)
Anion Gap: 9 mEq/L (ref 3–11)
BUN: 18 mg/dL (ref 7.0–26.0)
CO2: 24 mEq/L (ref 22–29)
Calcium: 9.9 mg/dL (ref 8.4–10.4)
Chloride: 104 mEq/L (ref 98–109)
Creatinine: 0.7 mg/dL (ref 0.6–1.1)
EGFR: 89 mL/min/{1.73_m2} — ABNORMAL LOW (ref >90)
Glucose: 157 mg/dl — ABNORMAL HIGH (ref 70–140)
Potassium: 3.9 mEq/L (ref 3.5–5.1)
Sodium: 138 mEq/L (ref 136–145)
Total Bilirubin: 0.27 mg/dL (ref 0.20–1.20)
Total Protein: 6.7 g/dL (ref 6.4–8.3)

## 2014-12-10 MED ORDER — TBO-FILGRASTIM 300 MCG/0.5ML ~~LOC~~ SOSY
300.0000 ug | PREFILLED_SYRINGE | Freq: Once | SUBCUTANEOUS | Status: AC
  Administered 2014-12-10: 300 ug via SUBCUTANEOUS
  Filled 2014-12-10: qty 0.5

## 2014-12-10 NOTE — Telephone Encounter (Signed)
"  I was there to see Dr. Marko Plume and forgot to ask about getting my hair died.  Can I and what what effect will it have?"  Advised that color will not look like it should.  Dr. Marko Plume notified and hair color will promote hair loss since she is under active taxol/carbo chemotherapy.  No further questions.

## 2014-12-10 NOTE — Progress Notes (Signed)
OFFICE PROGRESS NOTE   December 10, 2014   Physicians:Elizabeth Polly Cobia, MD, Gery Pray MD, [name] MD  INTERVAL HISTORY:  Patient is seen, alone for visit, in continuing attention to chemotherapy in process for recurrent endometrial carcinoma. She had day 1 cycle 1 dose dense carboplatin taxol on 11-27-14, then day 8 delayed due to [place] 1.1, with granix given x2 then. ANC today is only 1.2, so will hold treatment another week. We will try to give single agent taxol on 12-17-14, then dose reduce carboplatin slightly and add granix day 2 starting cycle 2.   Patient is feeling well, without noted fatigue and tolerating usual work schedule this week. She has had no fever or symptoms of infection. Nausea has been minimal, bowels are moving well with good hydration and good diet, no significant peripheral neuropathy. She did not have bad aches either with taxol at this lower dose, or with gCSF.    Primary care is by Dr [name], whom she is to see next in ~ August.  Mammograms at [facility] 04-20-14.  Colonoscopy @ [facility] 2008.   No central catheter. Peripheral IV access was adequate for previous chemotherapy and she much prefers this to [facility]. No genetics testing  ONCOLOGIC HISTORY  Patient presented to PCP with intermittent vaginal bleeding since ~ Oct 2013, endometrial biopsy 06-20-12 with complex endometrial hyperplasia with atypia. Pelvic and transvaginal US in [place] system on 06-26-12 showed endometrial thickening of 2.7 cm, right ovary not clearly seen and left ovary with normal postmenopausal appearance. She was seen by Dr Christophe Louis and referred to Dr Polly Cobia. She had robotic hysterectomy with BSO and bilateral pelvic and periaortic node dissection by Dr Polly Cobia at [facility] on 07-30-2012. Intraoperatively she had large endometrial polyp with high grade malignancy, no obvious extrauterine disease. Pathology ([facility] [place] [id number] from  07-30-2012) had high grade, poorly differentiated endometrial adenocarcinoma with solid and serous papillary components, 2 periaortic nodes negative and 9 right pelvic/11 left pelvic nodes negative. The tumor was 4.8 cm confined to polyp with no definite myometrial involvement, margins clear and no angiolymphatic invasion identified.  Cycle 1 taxol carboplatin was given 08-30-12, 6 cycles of q 3 week regimen given thru 12-13-2012, with gCSF support. Radiation was intracavitary HDR to proximal vagina 30 Gy in 5 fractions from May 27 thru November 27, 2012, by Dr Sondra Come. Patient was followed closely by Dr Polly Cobia after completing adjuvant taxol carboplatin chemotherapy in 12-2012. Patient felt very well, with intentional 70 lb weight loss over a year from Jan thru Dec 2015 on Weight Watchers. She saw Dr Polly Cobia with unremarkable exam in March 2016, however CA 125 was slightly elevated at 36.3, up from previous 29.2. She had CT CAP with contrast 10-16-14 with new left periaortic and left common iliac lymphadenopathy. PET 3-33-54 had hypermetabolic uptake in retroperitoneal nodes from lower poles of kidneys to aortic bifurcation,largest 2.0 x 2.1 cm, as well as uptake in either right supraclavicular node vs right lobe of thyroid (report below). She had FNA of thyroid 10-28-14 with benign tissue, [place] at time of biopsy reportedly showing no right supraclavicular adenopathy. She had CT needle biopsy of left para-aortic node 11-16-14 with path showing serous carcinoma. She began chemotherapy with dose dense carbo taxol on 11-27-14.     Review of systems as above, also: No SOB or other respiratory symptoms. No abdominal or pelvic pain. No LE swelling. No bleeding.  Remainder of 10 point Review of Systems negative.  Objective:  Vital signs in last 24 hours:  BP 108/59 mmHg  Pulse 89  Temp(Src) 98 F (36.7 C) (Oral)  Resp 18  Ht 5' 3"  (1.6 m)  Wt 140 lb 11.2 oz (63.821 kg)  BMI 24.93 kg/m2  SpO2 98% Weight up 3  lbs Alert, oriented and appropriate. Ambulatory without difficulty. Looks comfortable, very pleasant as always No alopecia  HEENT:PERRL, sclerae not icteric. Oral mucosa moist without lesions, posterior pharynx clear.  Neck supple. No JVD.  Lymphatics:no cervical,supraclavicular, axillary or inguinal adenopathy Resp: clear to auscultation bilaterally and normal percussion bilaterally Cardio: regular rate and rhythm. No gallop. GI: abdomen soft, nontender, not distended, no mass or organomegaly. Normally active bowel sounds. Surgical incisions not remarkable. Musculoskeletal/ Extremities: without pitting edema, cords, tenderness Neuro: no significant peripheral neuropathy. Otherwise nonfocal. PSYCH appropriate mood and affect Skin without rash, ecchymosis, petechiae   Lab Results:  Results for orders placed or performed in visit on 12/10/14  CBC with Differential  Result Value Ref Range   WBC 1.5 (L) 3.9 - 10.3 10e3/uL   NEUT# 1.2 (L) 1.5 - 6.5 10e3/uL   HGB 11.6 11.6 - 15.9 g/dL   HCT 33.8 (L) 34.8 - 46.6 %   Platelets 163 145 - 400 10e3/uL   MCV 94.2 79.5 - 101.0 fL   MCH 32.3 25.1 - 34.0 pg   MCHC 34.3 31.5 - 36.0 g/dL   RBC 3.59 (L) 3.70 - 5.45 10e6/uL   RDW 12.2 11.2 - 14.5 %   lymph# 0.3 (L) 0.9 - 3.3 10e3/uL   MONO# 0.0 (L) 0.1 - 0.9 10e3/uL   Eosinophils Absolute 0.0 0.0 - 0.5 10e3/uL   Basophils Absolute 0.0 0.0 - 0.1 10e3/uL   NEUT% 77.0 (H) 38.4 - 76.8 %   LYMPH% 20.9 14.0 - 49.7 %   MONO% 1.8 0.0 - 14.0 %   EOS% 0.0 0.0 - 7.0 %   BASO% 0.3 0.0 - 2.0 %  Comprehensive metabolic panel (Cmet) - [facility]  Result Value Ref Range   Sodium 138 136 - 145 mEq/L   Potassium 3.9 3.5 - 5.1 mEq/L   Chloride 104 98 - 109 mEq/L   CO2 24 22 - 29 mEq/L   Glucose 157 (H) 70 - 140 mg/dl   BUN 18.0 7.0 - 26.0 mg/dL   Creatinine 0.7 0.6 - 1.1 mg/dL   Total Bilirubin 0.27 0.20 - 1.20 mg/dL   Alkaline Phosphatase 57 40 - 150 U/L   AST 19 5 - 34 U/L   ALT 25 0 - 55 U/L   Total  Protein 6.7 6.4 - 8.3 g/dL   Albumin 3.9 3.5 - 5.0 g/dL   Calcium 9.9 8.4 - 10.4 mg/dL   Anion Gap 9 3 - 11 mEq/L   EGFR 89 (L) >90 ml/min/1.73 m2    TFTs from 11-27-14 all WNL, these sent to PCP.   Studies/Results:  No results found.  Medications: I have reviewed the patient's current medications.  DISCUSSION: ANC too low to treat today, so will delay single agent taxol x 1 week, and will check labs 7-13 to be sure ok for treatment (and premed steroids) 7-14. Will plan day 1 cycle 2 on 7-21, with dose adjustment in [name] and granix  days 2, 3,9,16.  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 Dose dense carbo taxol begun 11-27-14, delay day 8 / day 15 one week with ANC 1.1 on day 8 and 1.2 day 15. Granix given today, other plan as above. Needs referral back to Dr Sondra Come for possible radiation after 3 cycles chemo. 2.some diffuse uptake right thyroid on PET, no specific nodule on [place] and FNA benign. TFTs WNL 11-27-14 3.intentional weight loss of 70 lbs over the 2015 calendar year. BMI now 25 4.chronic constipation, which was a concern during last chemo. Up to date on colonoscopy. Doing ok now 5.HTN, tho has decreased BP meds with weight loss 6.elevated lipids 7.remote minimal tobacco, not enough to qualify for the lung cancer screening program 8.up to date mammograms 9.melanoma in brother   Chemo orders adjusted, granix orders confirmed. I will see her 7-21 with treatment same day. All questions answered, time spent 25 min including >50% counseling and coordination of care.  Gwendolyn Mclees P, MD   12/10/2014, 1:57 PM

## 2014-12-11 ENCOUNTER — Ambulatory Visit (HOSPITAL_BASED_OUTPATIENT_CLINIC_OR_DEPARTMENT_OTHER): Payer: 59

## 2014-12-11 VITALS — BP 96/46 | HR 79 | Temp 98.2°F

## 2014-12-11 DIAGNOSIS — C541 Malignant neoplasm of endometrium: Secondary | ICD-10-CM | POA: Diagnosis not present

## 2014-12-11 DIAGNOSIS — C772 Secondary and unspecified malignant neoplasm of intra-abdominal lymph nodes: Secondary | ICD-10-CM | POA: Diagnosis not present

## 2014-12-11 DIAGNOSIS — Z5189 Encounter for other specified aftercare: Secondary | ICD-10-CM | POA: Diagnosis not present

## 2014-12-11 MED ORDER — TBO-FILGRASTIM 300 MCG/0.5ML ~~LOC~~ SOSY
300.0000 ug | PREFILLED_SYRINGE | Freq: Once | SUBCUTANEOUS | Status: AC
  Administered 2014-12-11: 300 ug via SUBCUTANEOUS
  Filled 2014-12-11: qty 0.5

## 2014-12-16 ENCOUNTER — Telehealth: Payer: Self-pay | Admitting: *Deleted

## 2014-12-16 ENCOUNTER — Other Ambulatory Visit (HOSPITAL_BASED_OUTPATIENT_CLINIC_OR_DEPARTMENT_OTHER): Payer: 59

## 2014-12-16 ENCOUNTER — Other Ambulatory Visit: Payer: Self-pay | Admitting: Oncology

## 2014-12-16 ENCOUNTER — Ambulatory Visit (HOSPITAL_BASED_OUTPATIENT_CLINIC_OR_DEPARTMENT_OTHER): Payer: 59

## 2014-12-16 ENCOUNTER — Ambulatory Visit: Payer: 59

## 2014-12-16 VITALS — BP 106/58 | HR 65 | Temp 98.5°F

## 2014-12-16 DIAGNOSIS — C541 Malignant neoplasm of endometrium: Secondary | ICD-10-CM | POA: Diagnosis not present

## 2014-12-16 DIAGNOSIS — C772 Secondary and unspecified malignant neoplasm of intra-abdominal lymph nodes: Secondary | ICD-10-CM

## 2014-12-16 DIAGNOSIS — Z5189 Encounter for other specified aftercare: Secondary | ICD-10-CM | POA: Diagnosis not present

## 2014-12-16 LAB — COMPREHENSIVE METABOLIC PANEL (CC13)
ALT: 22 U/L (ref 0–55)
AST: 19 U/L (ref 5–34)
Albumin: 3.8 g/dL (ref 3.5–5.0)
Alkaline Phosphatase: 62 U/L (ref 40–150)
Anion Gap: 8 mEq/L (ref 3–11)
BUN: 16.2 mg/dL (ref 7.0–26.0)
CO2: 27 mEq/L (ref 22–29)
Calcium: 9.5 mg/dL (ref 8.4–10.4)
Chloride: 106 mEq/L (ref 98–109)
Creatinine: 0.7 mg/dL (ref 0.6–1.1)
EGFR: 87 mL/min/{1.73_m2} — ABNORMAL LOW (ref >90)
Glucose: 81 mg/dl (ref 70–140)
Potassium: 3.9 mEq/L (ref 3.5–5.1)
Sodium: 141 mEq/L (ref 136–145)
Total Bilirubin: 0.39 mg/dL (ref 0.20–1.20)
Total Protein: 6.5 g/dL (ref 6.4–8.3)

## 2014-12-16 LAB — CBC WITH DIFFERENTIAL/PLATELET
BASO%: 1.5 % (ref 0.0–2.0)
Basophils Absolute: 0 10*3/uL (ref 0.0–0.1)
EOS%: 1.9 % (ref 0.0–7.0)
Eosinophils Absolute: 0.1 10*3/uL (ref 0.0–0.5)
HCT: 33.4 % — ABNORMAL LOW (ref 34.8–46.6)
HGB: 11.3 g/dL — ABNORMAL LOW (ref 11.6–15.9)
LYMPH%: 41.6 % (ref 14.0–49.7)
MCH: 32.1 pg (ref 25.1–34.0)
MCHC: 33.8 g/dL (ref 31.5–36.0)
MCV: 94.9 fL (ref 79.5–101.0)
MONO#: 0.2 10*3/uL (ref 0.1–0.9)
MONO%: 9 % (ref 0.0–14.0)
NEUT#: 1.2 10*3/uL — ABNORMAL LOW (ref 1.5–6.5)
NEUT%: 46 % (ref 38.4–76.8)
Platelets: 90 10*3/uL — ABNORMAL LOW (ref 145–400)
RBC: 3.52 10*6/uL — ABNORMAL LOW (ref 3.70–5.45)
RDW: 12.8 % (ref 11.2–14.5)
WBC: 2.7 10*3/uL — ABNORMAL LOW (ref 3.9–10.3)
lymph#: 1.1 10*3/uL (ref 0.9–3.3)
nRBC: 0 % (ref 0–0)

## 2014-12-16 MED ORDER — TBO-FILGRASTIM 300 MCG/0.5ML ~~LOC~~ SOSY
300.0000 ug | PREFILLED_SYRINGE | Freq: Once | SUBCUTANEOUS | Status: AC
  Administered 2014-12-16: 300 ug via SUBCUTANEOUS
  Filled 2014-12-16: qty 0.5

## 2014-12-16 NOTE — Telephone Encounter (Signed)
Verbal order received and read back from Dr. Marko Plume for Granix injections beginning today and for a total of three days.  Orders will be entered by Dr. Marko Plume.  Notification given to patient and injection nurse at this time.  Diane Lozano will come in at 4:15 pm.  P.O.F. generated.

## 2014-12-16 NOTE — Telephone Encounter (Signed)
Patient here for lab.  Requests talking with Triage pending results.  WBC = 2.3, NEUT = 1.2, PLTC = 90.  This nurse informed patient she will not need treatment tomorrow due to counts.  Reviewed neutropenic precautions.  She denies any signs or symptoms of infection, has thermometer at home next scheduled F/U is 12-24-2014 for lab, MD, possible treat.  Uses [facility] on [address].   Expressed concern that she "has only received one treatment with this reoccurrence and counts are low for third week."

## 2014-12-17 ENCOUNTER — Other Ambulatory Visit: Payer: 59

## 2014-12-17 ENCOUNTER — Ambulatory Visit (HOSPITAL_BASED_OUTPATIENT_CLINIC_OR_DEPARTMENT_OTHER): Payer: 59

## 2014-12-17 ENCOUNTER — Encounter: Payer: Self-pay | Admitting: Nurse Practitioner

## 2014-12-17 ENCOUNTER — Encounter: Payer: Self-pay | Admitting: Oncology

## 2014-12-17 ENCOUNTER — Ambulatory Visit: Payer: 59

## 2014-12-17 ENCOUNTER — Telehealth: Payer: Self-pay

## 2014-12-17 VITALS — BP 94/46 | HR 88 | Temp 98.3°F

## 2014-12-17 DIAGNOSIS — Z5189 Encounter for other specified aftercare: Secondary | ICD-10-CM

## 2014-12-17 DIAGNOSIS — C772 Secondary and unspecified malignant neoplasm of intra-abdominal lymph nodes: Secondary | ICD-10-CM

## 2014-12-17 DIAGNOSIS — C541 Malignant neoplasm of endometrium: Secondary | ICD-10-CM

## 2014-12-17 MED ORDER — TBO-FILGRASTIM 300 MCG/0.5ML ~~LOC~~ SOSY
300.0000 ug | PREFILLED_SYRINGE | Freq: Once | SUBCUTANEOUS | Status: AC
  Administered 2014-12-17: 300 ug via SUBCUTANEOUS
  Filled 2014-12-17: qty 0.5

## 2014-12-17 NOTE — Telephone Encounter (Signed)
Diane Lozano is present for granix injection. She is wanting a wig rx faxed to [facility]. Their fax # [phone]. Pt will also need a copy to submit to her insurance.

## 2014-12-17 NOTE — Telephone Encounter (Signed)
Note placed on Dr Mariana Kaufman desk.

## 2014-12-17 NOTE — Progress Notes (Signed)
Wig order faxed to [facility] per patient request.

## 2014-12-18 ENCOUNTER — Ambulatory Visit (HOSPITAL_BASED_OUTPATIENT_CLINIC_OR_DEPARTMENT_OTHER): Payer: 59

## 2014-12-18 VITALS — BP 101/59 | HR 85 | Temp 98.4°F

## 2014-12-18 DIAGNOSIS — D709 Neutropenia, unspecified: Secondary | ICD-10-CM

## 2014-12-18 DIAGNOSIS — C541 Malignant neoplasm of endometrium: Secondary | ICD-10-CM

## 2014-12-18 MED ORDER — TBO-FILGRASTIM 300 MCG/0.5ML ~~LOC~~ SOSY
300.0000 ug | PREFILLED_SYRINGE | Freq: Once | SUBCUTANEOUS | Status: AC
  Administered 2014-12-18: 300 ug via SUBCUTANEOUS
  Filled 2014-12-18: qty 0.5

## 2014-12-20 ENCOUNTER — Other Ambulatory Visit: Payer: Self-pay | Admitting: Oncology

## 2014-12-20 DIAGNOSIS — C541 Malignant neoplasm of endometrium: Secondary | ICD-10-CM

## 2014-12-20 DIAGNOSIS — C7989 Secondary malignant neoplasm of other specified sites: Secondary | ICD-10-CM

## 2014-12-21 ENCOUNTER — Telehealth: Payer: Self-pay | Admitting: *Deleted

## 2014-12-21 NOTE — Telephone Encounter (Signed)
Patient would like to change her lab appointment to the day before MD visit to determine if she needs to take steroids.  Will notify schedulers.  "I can come during my lunch hour from 12:00 pm umtil 1:30 pm at the latest.  Will notify schedulers.

## 2014-12-23 ENCOUNTER — Other Ambulatory Visit (HOSPITAL_BASED_OUTPATIENT_CLINIC_OR_DEPARTMENT_OTHER): Payer: 59

## 2014-12-23 ENCOUNTER — Other Ambulatory Visit: Payer: Self-pay | Admitting: Oncology

## 2014-12-23 ENCOUNTER — Telehealth: Payer: Self-pay | Admitting: *Deleted

## 2014-12-23 DIAGNOSIS — C7989 Secondary malignant neoplasm of other specified sites: Secondary | ICD-10-CM

## 2014-12-23 DIAGNOSIS — C541 Malignant neoplasm of endometrium: Secondary | ICD-10-CM

## 2014-12-23 DIAGNOSIS — C772 Secondary and unspecified malignant neoplasm of intra-abdominal lymph nodes: Secondary | ICD-10-CM | POA: Diagnosis not present

## 2014-12-23 LAB — CBC WITH DIFFERENTIAL/PLATELET
BASO%: 0.3 % (ref 0.0–2.0)
Basophils Absolute: 0 10*3/uL (ref 0.0–0.1)
EOS%: 3.8 % (ref 0.0–7.0)
Eosinophils Absolute: 0.1 10*3/uL (ref 0.0–0.5)
HCT: 35.2 % (ref 34.8–46.6)
HGB: 11.8 g/dL (ref 11.6–15.9)
LYMPH%: 31.1 % (ref 14.0–49.7)
MCH: 32.6 pg (ref 25.1–34.0)
MCHC: 33.5 g/dL (ref 31.5–36.0)
MCV: 97.2 fL (ref 79.5–101.0)
MONO#: 0.3 10*3/uL (ref 0.1–0.9)
MONO%: 7.9 % (ref 0.0–14.0)
NEUT#: 1.8 10*3/uL (ref 1.5–6.5)
NEUT%: 56.9 % (ref 38.4–76.8)
Platelets: 116 10*3/uL — ABNORMAL LOW (ref 145–400)
RBC: 3.62 10*6/uL — ABNORMAL LOW (ref 3.70–5.45)
RDW: 13.7 % (ref 11.2–14.5)
WBC: 3.2 10*3/uL — ABNORMAL LOW (ref 3.9–10.3)
lymph#: 1 10*3/uL (ref 0.9–3.3)

## 2014-12-23 LAB — COMPREHENSIVE METABOLIC PANEL (CC13)
ALT: 22 U/L (ref 0–55)
AST: 18 U/L (ref 5–34)
Albumin: 4.1 g/dL (ref 3.5–5.0)
Alkaline Phosphatase: 70 U/L (ref 40–150)
Anion Gap: 9 mEq/L (ref 3–11)
BUN: 20.1 mg/dL (ref 7.0–26.0)
CO2: 26 mEq/L (ref 22–29)
Calcium: 10 mg/dL (ref 8.4–10.4)
Chloride: 106 mEq/L (ref 98–109)
Creatinine: 0.8 mg/dL (ref 0.6–1.1)
EGFR: 76 mL/min/{1.73_m2} — ABNORMAL LOW (ref >90)
Glucose: 68 mg/dl — ABNORMAL LOW (ref 70–140)
Potassium: 4 mEq/L (ref 3.5–5.1)
Sodium: 141 mEq/L (ref 136–145)
Total Bilirubin: 0.34 mg/dL (ref 0.20–1.20)
Total Protein: 7 g/dL (ref 6.4–8.3)

## 2014-12-23 NOTE — Telephone Encounter (Signed)
Received call from patient asking for lab results so she will know if she needs to take steroids prior to scheduled treatment for tomorrow. Reviewed patient's labs and notified her that they are within the parameters in the treatment plan. Patient agreeable to take the steroids tonight and to appt times tomorrow.

## 2014-12-24 ENCOUNTER — Ambulatory Visit (HOSPITAL_BASED_OUTPATIENT_CLINIC_OR_DEPARTMENT_OTHER): Payer: 59

## 2014-12-24 ENCOUNTER — Telehealth: Payer: Self-pay | Admitting: Oncology

## 2014-12-24 ENCOUNTER — Encounter: Payer: Self-pay | Admitting: Oncology

## 2014-12-24 ENCOUNTER — Ambulatory Visit (HOSPITAL_BASED_OUTPATIENT_CLINIC_OR_DEPARTMENT_OTHER): Payer: 59 | Admitting: Oncology

## 2014-12-24 ENCOUNTER — Other Ambulatory Visit: Payer: 59

## 2014-12-24 ENCOUNTER — Ambulatory Visit: Payer: 59

## 2014-12-24 VITALS — BP 117/62 | HR 86 | Temp 98.4°F | Resp 18 | Ht 63.0 in | Wt 140.0 lb

## 2014-12-24 DIAGNOSIS — D6959 Other secondary thrombocytopenia: Secondary | ICD-10-CM

## 2014-12-24 DIAGNOSIS — D72819 Decreased white blood cell count, unspecified: Secondary | ICD-10-CM | POA: Diagnosis not present

## 2014-12-24 DIAGNOSIS — C541 Malignant neoplasm of endometrium: Secondary | ICD-10-CM | POA: Diagnosis not present

## 2014-12-24 DIAGNOSIS — Z5111 Encounter for antineoplastic chemotherapy: Secondary | ICD-10-CM | POA: Diagnosis not present

## 2014-12-24 DIAGNOSIS — T451X5A Adverse effect of antineoplastic and immunosuppressive drugs, initial encounter: Secondary | ICD-10-CM

## 2014-12-24 DIAGNOSIS — C772 Secondary and unspecified malignant neoplasm of intra-abdominal lymph nodes: Secondary | ICD-10-CM

## 2014-12-24 DIAGNOSIS — D701 Agranulocytosis secondary to cancer chemotherapy: Secondary | ICD-10-CM

## 2014-12-24 LAB — CA 125: CA 125: 40 U/mL — ABNORMAL HIGH (ref <35)

## 2014-12-24 MED ORDER — SODIUM CHLORIDE 0.9 % IV SOLN
Freq: Once | INTRAVENOUS | Status: AC
  Administered 2014-12-24: 11:00:00 via INTRAVENOUS
  Filled 2014-12-24: qty 8

## 2014-12-24 MED ORDER — DIPHENHYDRAMINE HCL 50 MG/ML IJ SOLN
25.0000 mg | Freq: Once | INTRAMUSCULAR | Status: AC
  Administered 2014-12-24: 25 mg via INTRAVENOUS

## 2014-12-24 MED ORDER — FAMOTIDINE IN NACL 20-0.9 MG/50ML-% IV SOLN
INTRAVENOUS | Status: AC
  Filled 2014-12-24: qty 50

## 2014-12-24 MED ORDER — SODIUM CHLORIDE 0.9 % IV SOLN
Freq: Once | INTRAVENOUS | Status: AC
  Administered 2014-12-24: 10:00:00 via INTRAVENOUS

## 2014-12-24 MED ORDER — FAMOTIDINE IN NACL 20-0.9 MG/50ML-% IV SOLN
20.0000 mg | Freq: Once | INTRAVENOUS | Status: AC
  Administered 2014-12-24: 20 mg via INTRAVENOUS

## 2014-12-24 MED ORDER — DIPHENHYDRAMINE HCL 50 MG/ML IJ SOLN
INTRAMUSCULAR | Status: AC
  Filled 2014-12-24: qty 1

## 2014-12-24 MED ORDER — PACLITAXEL CHEMO INJECTION 300 MG/50ML
80.0000 mg/m2 | Freq: Once | INTRAVENOUS | Status: AC
  Administered 2014-12-24: 138 mg via INTRAVENOUS
  Filled 2014-12-24: qty 23

## 2014-12-24 NOTE — Telephone Encounter (Signed)
Gave avs & calendar for August °

## 2014-12-24 NOTE — Progress Notes (Signed)
OFFICE PROGRESS NOTE   December 24, 2014   Physicians:Elizabeth Polly Cobia, MD, Gery Pray MD, [name] MD  INTERVAL HISTORY:   Patient is seen, alone for visit, in continuing attention to chemotherapy in process for recurrent endometrial carcinoma. She had prolonged cytopenias after day 1 cycle 1 dose dense carbo taxol (that [name] at AUC=5) and despite total 7 doses of granix. Day 1 cycle 1 was 11-27-14, with counts too low to treat on 6-30, 7-7 and again 7-13. Counts on 7-20 have ANC up to 1.8 and platelets 116k.  She had EMEND with day 1 cycle 1 treatment, as well as IVF on day 2, with adequate control of nausea. She received EMEND with adjuvant chemo 2014. With counts within parameters on 12-23-14, she took premed decadron for taxol today.  Reconsult with Dr Sondra Come scheduled for 12-31-14. (Note counts may be more of a problem if pelvic RT).  Patient has felt generally well this week, working as usual. She has had no significant nausea, no bleeding, no fever or symptoms of infection. She has no significant peripheral neuropathy. She has no abdominal or pelvic pain. Bowels are moving regularly and no bladder complaints. She has held lisinopril 10 mg daily since BP 94/46 last week; lisinopril dose had been decreased by PCP within past year when BPs lower with intentional weight loss. She is to see PCP again in ~ 2 weeks.  No central catheter. Peripheral IV access was adequate for previous chemotherapy and she much prefers this to [employer]. No genetics testing CA 125  132 on 11-27-14 (day 1 cycle 1)    ONCOLOGIC HISTORY Patient presented to PCP with intermittent vaginal bleeding since ~ Oct 2013, endometrial biopsy 06-20-12 with complex endometrial hyperplasia with atypia. Pelvic and transvaginal US in [place] system on 06-26-12 showed endometrial thickening of 2.7 cm, right ovary not clearly seen and left ovary with normal postmenopausal appearance. She was seen by Dr Christophe Louis and referred to Dr  Polly Cobia. She had robotic hysterectomy with BSO and bilateral pelvic and periaortic node dissection by Dr Polly Cobia at [facility] on 07-30-2012. Intraoperatively she had large endometrial polyp with high grade malignancy, no obvious extrauterine disease. Pathology ([facility] [place] [id number] from 07-30-2012) had high grade, poorly differentiated endometrial adenocarcinoma with solid and serous papillary components, 2 periaortic nodes negative and 9 right pelvic/11 left pelvic nodes negative. The tumor was 4.8 cm confined to polyp with no definite myometrial involvement, margins clear and no angiolymphatic invasion identified.  Cycle 1 taxol carboplatin was given 08-30-12, 6 cycles of q 3 week regimen given thru 12-13-2012, with gCSF support. Radiation was intracavitary HDR to proximal vagina 30 Gy in 5 fractions from May 27 thru November 27, 2012, by Dr Sondra Come. Patient was followed closely by Dr Polly Cobia after completing adjuvant taxol carboplatin chemotherapy in 12-2012. Patient felt very well, with intentional 70 lb weight loss over a year from Jan thru Dec 2015 on Weight Watchers. She saw Dr Polly Cobia with unremarkable exam in March 2016, however CA 125 was slightly elevated at 36.3, up from previous 29.2. She had CT CAP with contrast 10-16-14 with new left periaortic and left common iliac lymphadenopathy. PET 08-23-20 had hypermetabolic uptake in retroperitoneal nodes from lower poles of kidneys to aortic bifurcation,largest 2.0 x 2.1 cm, as well as uptake in either right supraclavicular node vs right lobe of thyroid (report below). She had FNA of thyroid 10-28-14 with benign tissue, [place] at time of biopsy reportedly showing no right supraclavicular  adenopathy. She had CT needle biopsy of left para-aortic node 11-16-14 with path showing serous carcinoma. She began chemotherapy with dose dense carbo taxol on 11-27-14, with carbo at AUC = 5. Over next 3 weeks WBC and platelets were too low  for treatments, despite total 7 doses of granix.     Review of systems as above, also: No SOB or other respiratory symptoms. No LE swelling. Hair has thinned. Remainder of 10 point Review of Systems negative.  Objective:  Vital signs in last 24 hours: Weight stable at 140 lbs. 117/62, HR 86 regular, resp 18 not labored RA, temp 98.4, O2 sat 99  Alert, oriented and appropriate. Ambulatory without difficulty.  Hair thin but no complete alopecia.   HEENT:PERRL, sclerae not icteric. Oral mucosa moist without lesions, posterior pharynx clear.  Neck supple. No JVD.  Lymphatics:no cervical,supraclavicular or inguinal adenopathy Resp: clear to auscultation bilaterally and normal percussion bilaterally Cardio: regular rate and rhythm. No gallop. GI: soft, nontender, not distended, no mass or organomegaly. Normally active bowel sounds. Surgical incisions not remarkable. Musculoskeletal/ Extremities: without pitting edema, cords, tenderness. Kyphosis.  Neuro: no peripheral neuropathy. Otherwise nonfocal. Psych appropriate mood and affect Skin without rash, ecchymosis, petechiae   Lab Results:  Results for orders placed or performed in visit on 12/23/14  CBC with Differential  Result Value Ref Range   WBC 3.2 (L) 3.9 - 10.3 10e3/uL   NEUT# 1.8 1.5 - 6.5 10e3/uL   HGB 11.8 11.6 - 15.9 g/dL   HCT 35.2 34.8 - 46.6 %   Platelets 116 (L) 145 - 400 10e3/uL   MCV 97.2 79.5 - 101.0 fL   MCH 32.6 25.1 - 34.0 pg   MCHC 33.5 31.5 - 36.0 g/dL   RBC 3.62 (L) 3.70 - 5.45 10e6/uL   RDW 13.7 11.2 - 14.5 %   lymph# 1.0 0.9 - 3.3 10e3/uL   MONO# 0.3 0.1 - 0.9 10e3/uL   Eosinophils Absolute 0.1 0.0 - 0.5 10e3/uL   Basophils Absolute 0.0 0.0 - 0.1 10e3/uL   NEUT% 56.9 38.4 - 76.8 %   LYMPH% 31.1 14.0 - 49.7 %   MONO% 7.9 0.0 - 14.0 %   EOS% 3.8 0.0 - 7.0 %   BASO% 0.3 0.0 - 2.0 %  Comprehensive metabolic panel (Cmet) - [facility]  Result Value Ref Range   Sodium 141 136 - 145 mEq/L   Potassium 4.0  3.5 - 5.1 mEq/L   Chloride 106 98 - 109 mEq/L   CO2 26 22 - 29 mEq/L   Glucose 68 (L) 70 - 140 mg/dl   BUN 20.1 7.0 - 26.0 mg/dL   Creatinine 0.8 0.6 - 1.1 mg/dL   Total Bilirubin 0.34 0.20 - 1.20 mg/dL   Alkaline Phosphatase 70 40 - 150 U/L   AST 18 5 - 34 U/L   ALT 22 0 - 55 U/L   Total Protein 7.0 6.4 - 8.3 g/dL   Albumin 4.1 3.5 - 5.0 g/dL   Calcium 10.0 8.4 - 10.4 mg/dL   Anion Gap 9 3 - 11 mEq/L   EGFR 76 (L) >90 ml/min/1.73 m2    CA 125 resulted after visit down to 40, this having been 132 on 11-27-14  Studies/Results:  No results found.  Medications: I have reviewed the patient's current medications. Hold carboplatin today (day 1 cycle 2). No EMEND today. Hold lisinopril 10 mg daily if BP < 140/80 for next 2 weeks until she sees PCP. Hold prophylactic ASA.  DISCUSSION Leukopenia and  thrombocytopenia unexpectedly more of a problem since single treatment with day 1 cycle 1 dose dense carbo taxol on 11-27-14. Adjuvant chemo information reviewed, cytopenias not as pronounced tho she did drop; radiation after that chemo was only brachytherapy; EMEND was used adjuvantly. I have discussed possible lower counts related to prior treatment. EMEND can infrequently worsen cytopenias. Certainly we can decrease the carboplatin dose, however with platelets only 116 today I have recommended that we hold [name] this second cycle and give taxol weekly. She is in agreement with holding EMEND also.  Will given granix at 480 mcg days 2,3,9,16 or can add additional days if needed. OK to treat with cycle 2 weekly taxol if ANC >=1.4 and plt >=90-100k. Would add additional granix on day 10 and / or day 17 if ANC <1.5 for day 8 or day 15  Could consider changing regimen with cycle 3 to taxol days 1,8,15 with carbo day 15 then neulasta day 16 with treatment q 28 days.   Patient can cancel IVF on 7-23 if taking po fluids very well and BP good.  Note significant drop in CA125 with just the single carbo and  low dose taxol. We will let patient know.   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 Dose dense carbo taxol begun 11-27-14, counts too low for days 8 and 15 cycle 1.  Will try weekly taxol only for cycle 2 beginning today. Will check counts day prior to each treatment, does not need to take premed steroids if low. Will give granix at 480 mcg days 2 and 3, with IVF day 3 if needed. Will treat days 8 and 15 if ANC >=1.4 and platelets >90k. Will give granix days 9 and 16, plus add additional granix days 10 and 17 if ANC <1.5.  Hold EMEND.   Dr Sondra Come to see for reconsult on 7-28,   possible radiation after 3 cycles chemo. 2.some diffuse uptake right thyroid on PET, no specific nodule on [place] and FNA benign. TFTs WNL 11-27-14 3.intentional weight loss of 70 lbs over the 2015 calendar year. BMI now 25 4.chronic constipation, which was a concern during last chemo. Up to date on colonoscopy. Doing ok now 5.HTN, tho has decreased BP meds with weight loss 6.elevated lipids 7.remote minimal tobacco, not enough to qualify for the lung cancer screening program 8.up to date mammograms 9.melanoma in brother   She will have day 8 cycle 2 on 7-28, see APP on 01-04-15, and day 15 cycle 2 on 01-07-15. I will see her with start of cycle 3 on 01-14-15. All questions answered and patient is in agreement with plans as above. Changes to scheduling submitted. Chemo orders and granix orders adjusted. Message to nursing and APP for coordination of care week of 01-04-15.  Cc Drs Polly Cobia, Antonietta Barcelona Time spent 40 min including >50% counseling and coordination of  care.   Gizel Riedlinger P, MD   12/24/2014, 8:41 AM

## 2014-12-25 ENCOUNTER — Ambulatory Visit (HOSPITAL_BASED_OUTPATIENT_CLINIC_OR_DEPARTMENT_OTHER): Payer: 59

## 2014-12-25 VITALS — BP 119/67 | HR 82 | Temp 98.3°F

## 2014-12-25 DIAGNOSIS — D709 Neutropenia, unspecified: Secondary | ICD-10-CM

## 2014-12-25 DIAGNOSIS — C541 Malignant neoplasm of endometrium: Secondary | ICD-10-CM

## 2014-12-25 MED ORDER — TBO-FILGRASTIM 480 MCG/0.8ML ~~LOC~~ SOSY
480.0000 ug | PREFILLED_SYRINGE | Freq: Once | SUBCUTANEOUS | Status: AC
  Administered 2014-12-25: 480 ug via SUBCUTANEOUS
  Filled 2014-12-25: qty 0.8

## 2014-12-26 ENCOUNTER — Ambulatory Visit: Payer: 59

## 2014-12-26 ENCOUNTER — Ambulatory Visit (HOSPITAL_BASED_OUTPATIENT_CLINIC_OR_DEPARTMENT_OTHER): Payer: 59

## 2014-12-26 VITALS — BP 103/59 | HR 72 | Temp 98.2°F | Resp 16

## 2014-12-26 DIAGNOSIS — T451X5A Adverse effect of antineoplastic and immunosuppressive drugs, initial encounter: Secondary | ICD-10-CM

## 2014-12-26 DIAGNOSIS — D6959 Other secondary thrombocytopenia: Secondary | ICD-10-CM | POA: Insufficient documentation

## 2014-12-26 DIAGNOSIS — Z5189 Encounter for other specified aftercare: Secondary | ICD-10-CM

## 2014-12-26 DIAGNOSIS — C541 Malignant neoplasm of endometrium: Secondary | ICD-10-CM

## 2014-12-26 MED ORDER — TBO-FILGRASTIM 480 MCG/0.8ML ~~LOC~~ SOSY
480.0000 ug | PREFILLED_SYRINGE | Freq: Once | SUBCUTANEOUS | Status: AC
  Administered 2014-12-26: 480 ug via SUBCUTANEOUS

## 2014-12-26 NOTE — Progress Notes (Signed)
Pt refused fluids, she is not nauseated, she states she feels good, she was encouraged to drink at least 64 oz water per day.

## 2014-12-28 ENCOUNTER — Telehealth: Payer: Self-pay

## 2014-12-28 NOTE — Telephone Encounter (Signed)
Told Ms. Kirt the result of the CA-125 as noted below by Dr. Marko Plume.

## 2014-12-28 NOTE — Telephone Encounter (Signed)
-----   Message from [name], MD sent at 12/27/2014  8:40 PM EDT ----- Labs seen and need follow up: she may have seen on My Chart, but if not please let her know that the CA 125 was down to 40, which is very impressive with just the one chemo treatment

## 2014-12-30 ENCOUNTER — Other Ambulatory Visit (HOSPITAL_BASED_OUTPATIENT_CLINIC_OR_DEPARTMENT_OTHER): Payer: 59

## 2014-12-30 ENCOUNTER — Telehealth: Payer: Self-pay

## 2014-12-30 ENCOUNTER — Other Ambulatory Visit: Payer: Self-pay | Admitting: Oncology

## 2014-12-30 DIAGNOSIS — C541 Malignant neoplasm of endometrium: Secondary | ICD-10-CM

## 2014-12-30 LAB — CBC WITH DIFFERENTIAL/PLATELET
BASO%: 0.9 % (ref 0.0–2.0)
Basophils Absolute: 0 10*3/uL (ref 0.0–0.1)
EOS%: 5.3 % (ref 0.0–7.0)
Eosinophils Absolute: 0.1 10*3/uL (ref 0.0–0.5)
HCT: 31.8 % — ABNORMAL LOW (ref 34.8–46.6)
HGB: 10.7 g/dL — ABNORMAL LOW (ref 11.6–15.9)
LYMPH%: 44.6 % (ref 14.0–49.7)
MCH: 32.6 pg (ref 25.1–34.0)
MCHC: 33.8 g/dL (ref 31.5–36.0)
MCV: 96.6 fL (ref 79.5–101.0)
MONO#: 0.2 10*3/uL (ref 0.1–0.9)
MONO%: 8.5 % (ref 0.0–14.0)
NEUT#: 1.1 10*3/uL — ABNORMAL LOW (ref 1.5–6.5)
NEUT%: 40.7 % (ref 38.4–76.8)
Platelets: 254 10*3/uL (ref 145–400)
RBC: 3.29 10*6/uL — ABNORMAL LOW (ref 3.70–5.45)
RDW: 14.5 % (ref 11.2–14.5)
WBC: 2.8 10*3/uL — ABNORMAL LOW (ref 3.9–10.3)
lymph#: 1.3 10*3/uL (ref 0.9–3.3)

## 2014-12-30 LAB — COMPREHENSIVE METABOLIC PANEL (CC13)
ALT: 18 U/L (ref 0–55)
AST: 18 U/L (ref 5–34)
Albumin: 3.6 g/dL (ref 3.5–5.0)
Alkaline Phosphatase: 61 U/L (ref 40–150)
Anion Gap: 7 mEq/L (ref 3–11)
BUN: 21.3 mg/dL (ref 7.0–26.0)
CO2: 29 mEq/L (ref 22–29)
Calcium: 9.4 mg/dL (ref 8.4–10.4)
Chloride: 106 mEq/L (ref 98–109)
Creatinine: 0.8 mg/dL (ref 0.6–1.1)
EGFR: 83 mL/min/{1.73_m2} — ABNORMAL LOW (ref >90)
Glucose: 83 mg/dl (ref 70–140)
Potassium: 4.5 mEq/L (ref 3.5–5.1)
Sodium: 141 mEq/L (ref 136–145)
Total Bilirubin: 0.24 mg/dL (ref 0.20–1.20)
Total Protein: 6.3 g/dL — ABNORMAL LOW (ref 6.4–8.3)

## 2014-12-30 NOTE — Telephone Encounter (Signed)
Told Diane Lozano that her [place] today was too low for treatment tomorrow. ANC = 1. Cancelled treatment for 12-31-14.   Told her Dr. Marko Plume wants her to have Granix tomorrow and Friday.

## 2014-12-30 NOTE — Progress Notes (Signed)
GYN Location of Tumor / Histology: recurrent endometrial carcinoma  Diane Lozano presented per Dr. Marko Plume, when "she saw Dr Polly Cobia with unremarkable exam in March 2016, however CA 125 was slightly elevated at 36.3, up from previous 29.2. She had CT CAP with contrast 10-16-14 with new left periaortic and left common iliac lymphadenopathy. PET 0-44-71 had hypermetabolic uptake in retroperitoneal nodes from lower poles of kidneys to aortic bifurcation,largest 2.0 x 2.1 cm, as well as uptake in either right supraclavicular node vs right lobe of thyroid (report below). She had FNA of thyroid 10-28-14 with benign tissue, [place] at time of biopsy reportedly showing no right supraclavicular adenopathy. She had CT needle biopsy of left para-aortic node 11-16-14 with path showing serous carcinoma."  Biopsies revealed:   11/16/14  DIAGNOSIS     LYMPH NODE, LEFT PARA-AORTIC, BIOPSY:   METASTATIC CARCINOMA, CONSISTENT WITH METASTATIC HIGH GRADE SEROUS   CARCINOMA.   ([facility])     COMMENT   ONLY VERY SMALL AGGREGATES OF MALIGNANT CELLS ARE PRESENT, BUT   IMMUNOHISTOCHEMICALLY, THEIR STAINING PATTERN IS SIMILAR TO THAT NOTED   PREVIOUSLY ([id number]):THEY ARE POSITIVE FOR CYTOKERATIN AE1/AE3,   POSITIVE FOR P53, AND POSITIVE FOR WT1.THE OVERALL MORPHOLOGY AND   IMMUNOHISTOCHEMICAL PATTERN ARE CONSISTENT WITH METASTATIC HIGH GRADE   SEROSA ADENOCARCINOMA.    Past/Anticipated interventions by Gyn/Onc surgery, if any: robotic hysterectomy with BSO and bilateral pelvic and periaortic node dissection in by Dr. Polly Cobia on 07/30/12.  Past/Anticipated interventions by medical oncology, if any: Dr Polly Cobia has recommended chemotherapy with weekly carbo taxol x 3 cycles, then radiation, then additional chemotherapy.  She is s/p 2 cycles dose dense carbo taxol on 12/24/14..  Weight changes, if any: no  Bowel/Bladder complaints, if any: denies bowel and bladder issues.  Nausea/Vomiting, if any: no  Pain  issues, if any:  no  SAFETY ISSUES:  Prior radiation? 5/27, 6/5, 6/11, 6/19, 11/27/2012 - proximal vagina 30 Gy in 5 fx, HDR   Pacemaker/ICD? no  Possible current pregnancy? no  Is the patient on methotrexate? no  Current Complaints / other details:  Patient denies pain.  She had a granix injection today.  BP 109/64 mmHg  Pulse 61  Temp(Src) 98.3 F (36.8 C) (Oral)  Resp 16  Ht _0  (1.6 m)  Wt 146 lb 3.2 oz (66.316 kg)  BMI 25.90 kg/m2

## 2014-12-31 ENCOUNTER — Ambulatory Visit
Admission: RE | Admit: 2014-12-31 | Discharge: 2014-12-31 | Disposition: A | Discharge status: Home / Self Care | Payer: 59 | Source: Ambulatory Visit | Attending: Radiation Oncology | Admitting: Radiation Oncology

## 2014-12-31 ENCOUNTER — Other Ambulatory Visit: Payer: 59

## 2014-12-31 ENCOUNTER — Ambulatory Visit: Payer: 59

## 2014-12-31 ENCOUNTER — Ambulatory Visit (HOSPITAL_BASED_OUTPATIENT_CLINIC_OR_DEPARTMENT_OTHER): Payer: 59

## 2014-12-31 ENCOUNTER — Encounter: Payer: Self-pay | Admitting: Radiation Oncology

## 2014-12-31 VITALS — BP 107/51 | HR 72 | Temp 98.2°F

## 2014-12-31 VITALS — BP 109/64 | HR 61 | Temp 98.3°F | Resp 16 | Ht 63.0 in | Wt 146.2 lb

## 2014-12-31 DIAGNOSIS — C541 Malignant neoplasm of endometrium: Secondary | ICD-10-CM

## 2014-12-31 DIAGNOSIS — Z510 Encounter for antineoplastic radiation therapy: Secondary | ICD-10-CM | POA: Diagnosis present

## 2014-12-31 DIAGNOSIS — Z5189 Encounter for other specified aftercare: Secondary | ICD-10-CM | POA: Diagnosis not present

## 2014-12-31 DIAGNOSIS — C772 Secondary and unspecified malignant neoplasm of intra-abdominal lymph nodes: Secondary | ICD-10-CM | POA: Insufficient documentation

## 2014-12-31 MED ORDER — TBO-FILGRASTIM 480 MCG/0.8ML ~~LOC~~ SOSY
480.0000 ug | PREFILLED_SYRINGE | Freq: Once | SUBCUTANEOUS | Status: AC
  Administered 2014-12-31: 480 ug via SUBCUTANEOUS
  Filled 2014-12-31: qty 0.8

## 2014-12-31 NOTE — Progress Notes (Signed)
Please see the Nurse Progress Note in the MD Initial Consult Encounter for this patient. 

## 2014-12-31 NOTE — Progress Notes (Signed)
Radiation Oncology         [phone] ________________________________  Name: Diane Lozano MRN: [id number]  Date: 12/31/2014  DOB: 1956/02/08  Re-evaluation Note  CC: [name], MD  [name], MD    ICD-9-CM ICD-10-CM   1. Metastatic cancer to intra-abdominal lymph nodes 196.2 C77.2     Diagnosis: Recurrent Endometrial cancer (high-grade serous adenocarcinoma) with metastasis to the retroperitoneal lymph nodes  Interval Since Last Radiation:  2 years (May 27, June 5,  June 11, June 19, November 27, 2012) Site/dose:   Proximal vagina 30 Gy in 5 fractions. She completed intracavitary brachytherapy treatments   Narrative:  The patient returns today for evaluation and consideration of her radiation therapy as part of management of patient's recurrent endometrial cancer. Patient earlier this year was noted to have a rising CA 125. A subsequent CT scan of abdomen and pelvis revealed new mild retroperitoneal lymphadenopathy along the left periaortic region and proximal left common iliac chain. This was suspicious for metastatic disease. Patient proceeded to undergo biopsy of the left periaortic node . This returned metastatic carcinoma consistent with high-grade serous carcinoma. The patient's pathology was compared to her prior pathology and was felt to be consistent with recurrence. Patient did undergo a PET scan which did show activity in the abdominal retroperitoneal lymphadenopathy. There is also uptake in the region of the right supraclavicular versus thyroid area. A biopsy of the right thyroid region revealed no evidence of malignancy. Patient was referred to Dr. Marko Plume for management of the patient's recurrence. She is recently proceeded with systemic chemotherapy consisting of dose dense carbo/taxol. The patient has had some delays in her treatment in light of blood counts. Patient is now seen in radiation oncology for consideration for radiation therapy as part of management of  the patient's recurrence.   Patient denies pain, fatigue, appetite changes. She denies vaginal bleeding/discharge.   ALLERGIES:  has No Known Allergies.  Meds: Current Outpatient Prescriptions  Medication Sig Dispense Refill  . Calcium Carb-Cholecalciferol 500-600 MG-UNIT TABS Take 1 tablet by mouth daily.    Marland Kitchen dexamethasone (DECADRON) 4 MG tablet Take 5 tablets  With food 12 hrs prior to Taxol Chemotherapy. 15 tablet 2  . simvastatin (ZOCOR) 40 MG tablet Take 40 mg by mouth every evening.    . venlafaxine XR (EFFEXOR-XR) 75 MG 24 hr capsule Take 75 mg by mouth daily.    Marland Kitchen aspirin 81 MG tablet Take 81 mg by mouth daily.    Marland Kitchen lisinopril (PRINIVIL,ZESTRIL) 10 MG tablet Take 10 mg by mouth daily.    Marland Kitchen LORazepam (ATIVAN) 1 MG tablet Place 1/2 -1 tablet under the tongue or swallow every 6 hrs as needed for nausea.  Will make drowsy. (Patient not taking: Reported on 12/24/2014) 30 tablet 1  . ondansetron (ZOFRAN) 8 MG tablet Take 1 tablet (8 mg total) by mouth every 8 (eight) hours as needed for nausea or vomiting. (Patient not taking: Reported on 12/24/2014) 30 tablet 2  . senna-docusate (SENOKOT S) 8.6-50 MG per tablet Take 1 tablet by mouth daily as needed for mild constipation.     No current facility-administered medications for this encounter.    Physical Findings: The patient is in no acute distress. Patient is alert and oriented.  height is 5\' 3"  (1.6 m) and weight is 146 lb 3.2 oz (66.316 kg). Her oral temperature is 98.3 F (36.8 C). Her blood pressure is 109/64 and her pulse is 61. Her respiration is 16.  No significant changes. This is a pleasant woman who appears her stated age. General: Alert and oriented, in no acute distress HEENT: Head is normocephalic. Extraocular movements are intact. Neck: Neck is supple, no palpable cervical or supraclavicular lymphadenopathy. Extremities: No cyanosis or edema. Lymphatics: see Neck Exam Skin: No concerning lesions. Musculoskeletal:  symmetric strength and muscle tone throughout. Neurologic: Cranial nerves II through XII are grossly intact. No obvious focalities. Speech is fluent. Coordination is intact. Psychiatric: Judgment and insight are intact. Affect is appropriate.  Lab Findings: Lab Results  Component Value Date   WBC 2.8* 12/30/2014   HGB 10.7* 12/30/2014   HCT 31.8* 12/30/2014   MCV 96.6 12/30/2014   PLT 254 12/30/2014    Radiographic Findings: No results found.  Impression: Recurrent high-grade serous adenocarcinoma with metastasis to the retroperitoneal lymph nodes. The patient will be a good candidate for radiation therapy directed to her area of recurrence in addition to the systemic chemotherapy she is now receiving. The area of recurrence is above where her previous vaginal brachii therapy was performed and therefore the patient could receive  treatment without any anticipated overlap with her prior radiation therapy.  Plan: Patient will continue with chemotherapy as a sandwich technique. She will be referred back to radiation oncology once she has completed her third cycle of chemotherapy to proceed with her radiation therapy as planned. This treatment will then be followed by 3 additional cycles of chemotherapy  This document serves as a record of services personally performed by Gery Pray, MD. It was created on his behalf by Darcus Austin, a trained medical scribe. The creation of this record is based on the scribe's personal observations and the provider's statements to them. This document has been checked and approved by the attending provider.  ____________________________________  Blair Promise, PhD, MD

## 2015-01-01 ENCOUNTER — Ambulatory Visit (HOSPITAL_BASED_OUTPATIENT_CLINIC_OR_DEPARTMENT_OTHER): Payer: 59

## 2015-01-01 VITALS — BP 104/61 | HR 79 | Temp 98.4°F

## 2015-01-01 DIAGNOSIS — C772 Secondary and unspecified malignant neoplasm of intra-abdominal lymph nodes: Secondary | ICD-10-CM

## 2015-01-01 DIAGNOSIS — Z5189 Encounter for other specified aftercare: Secondary | ICD-10-CM

## 2015-01-01 DIAGNOSIS — C541 Malignant neoplasm of endometrium: Secondary | ICD-10-CM

## 2015-01-01 MED ORDER — TBO-FILGRASTIM 480 MCG/0.8ML ~~LOC~~ SOSY
480.0000 ug | PREFILLED_SYRINGE | Freq: Once | SUBCUTANEOUS | Status: AC
  Administered 2015-01-01: 480 ug via SUBCUTANEOUS
  Filled 2015-01-01: qty 0.8

## 2015-01-02 ENCOUNTER — Other Ambulatory Visit: Payer: Self-pay | Admitting: Oncology

## 2015-01-04 ENCOUNTER — Encounter: Payer: Self-pay | Admitting: Oncology

## 2015-01-04 ENCOUNTER — Ambulatory Visit (HOSPITAL_BASED_OUTPATIENT_CLINIC_OR_DEPARTMENT_OTHER): Payer: 59 | Admitting: Oncology

## 2015-01-04 ENCOUNTER — Other Ambulatory Visit (HOSPITAL_BASED_OUTPATIENT_CLINIC_OR_DEPARTMENT_OTHER): Payer: 59

## 2015-01-04 VITALS — BP 115/62 | HR 67 | Temp 98.5°F | Resp 18 | Ht 63.0 in | Wt 144.9 lb

## 2015-01-04 DIAGNOSIS — I10 Essential (primary) hypertension: Secondary | ICD-10-CM | POA: Diagnosis not present

## 2015-01-04 DIAGNOSIS — C772 Secondary and unspecified malignant neoplasm of intra-abdominal lymph nodes: Secondary | ICD-10-CM

## 2015-01-04 DIAGNOSIS — E789 Disorder of lipoprotein metabolism, unspecified: Secondary | ICD-10-CM

## 2015-01-04 DIAGNOSIS — C541 Malignant neoplasm of endometrium: Secondary | ICD-10-CM

## 2015-01-04 LAB — COMPREHENSIVE METABOLIC PANEL (CC13)
ALT: 25 U/L (ref 0–55)
AST: 21 U/L (ref 5–34)
Albumin: 3.8 g/dL (ref 3.5–5.0)
Alkaline Phosphatase: 70 U/L (ref 40–150)
Anion Gap: 3 mEq/L (ref 3–11)
BUN: 16.4 mg/dL (ref 7.0–26.0)
CO2: 30 mEq/L — ABNORMAL HIGH (ref 22–29)
Calcium: 9.3 mg/dL (ref 8.4–10.4)
Chloride: 110 mEq/L — ABNORMAL HIGH (ref 98–109)
Creatinine: 0.7 mg/dL (ref 0.6–1.1)
EGFR: 87 mL/min/{1.73_m2} — ABNORMAL LOW (ref >90)
Glucose: 78 mg/dl (ref 70–140)
Potassium: 3.9 mEq/L (ref 3.5–5.1)
Sodium: 142 mEq/L (ref 136–145)
Total Bilirubin: 0.22 mg/dL (ref 0.20–1.20)
Total Protein: 6.7 g/dL (ref 6.4–8.3)

## 2015-01-04 LAB — CBC WITH DIFFERENTIAL/PLATELET
BASO%: 1.5 % (ref 0.0–2.0)
Basophils Absolute: 0.1 10*3/uL (ref 0.0–0.1)
EOS%: 2.2 % (ref 0.0–7.0)
Eosinophils Absolute: 0.1 10*3/uL (ref 0.0–0.5)
HCT: 35 % (ref 34.8–46.6)
HGB: 11.7 g/dL (ref 11.6–15.9)
LYMPH%: 27.1 % (ref 14.0–49.7)
MCH: 32.7 pg (ref 25.1–34.0)
MCHC: 33.3 g/dL (ref 31.5–36.0)
MCV: 98.2 fL (ref 79.5–101.0)
MONO#: 0.3 10*3/uL (ref 0.1–0.9)
MONO%: 7 % (ref 0.0–14.0)
NEUT#: 2.5 10*3/uL (ref 1.5–6.5)
NEUT%: 62.2 % (ref 38.4–76.8)
Platelets: 245 10*3/uL (ref 145–400)
RBC: 3.57 10*6/uL — ABNORMAL LOW (ref 3.70–5.45)
RDW: 14.9 % — ABNORMAL HIGH (ref 11.2–14.5)
WBC: 4 10*3/uL (ref 3.9–10.3)
lymph#: 1.1 10*3/uL (ref 0.9–3.3)

## 2015-01-04 NOTE — Progress Notes (Signed)
OFFICE PROGRESS NOTE   January 04, 2015   Physicians:Elizabeth Polly Cobia, MD, Gery Pray MD, [name] MD  INTERVAL HISTORY:   Patient is seen, alone for visit, for recurrent endometrial carcinoma. She had prolonged cytopenias after day 1 cycle 1 dose dense carbo taxol (that [name] at AUC=5) and despite total 7 doses of granix. Day 1 cycle 1 was 11-27-14, with counts too low to treat on 6-30, 7-7 and again 7-13. Counts on 7-20 have ANC up to 1.8 and platelets 116k.  She had EMEND with day 1 cycle 1 treatment, as well as IVF on day 2, with adequate control of nausea. She received EMEND with adjuvant chemo 2014. Received single agent Taxol on 12/24/14. Marland Kitchen Has a reconsult with Dr Sondra Come scheduled for 12-31-14. Plans for more XRT after 3 cycles of chemo then followed by additional chemo.  Patient has felt generally well this week, working as usual. She has had no significant nausea, no bleeding, no fever or symptoms of infection. She has no significant peripheral neuropathy. She has no abdominal or pelvic pain. Bowels are moving regularly and no bladder complaints.  No central catheter. Peripheral IV access was adequate for previous chemotherapy and she much prefers this to [facility]. No genetics testing CA 125  132 on 11-27-14 (day 1 cycle 1) down to 40 on 12/23/2014.    ONCOLOGIC HISTORY Patient presented to PCP with intermittent vaginal bleeding since ~ Oct 2013, endometrial biopsy 06-20-12 with complex endometrial hyperplasia with atypia. Pelvic and transvaginal US in [facility] system on 06-26-12 showed endometrial thickening of 2.7 cm, right ovary not clearly seen and left ovary with normal postmenopausal appearance. She was seen by Dr Christophe Louis and referred to Dr Polly Cobia. She had robotic hysterectomy with BSO and bilateral pelvic and periaortic node dissection by Dr Polly Cobia at [facility] on 07-30-2012. Intraoperatively she had large endometrial polyp with high grade malignancy, no obvious  extrauterine disease. Pathology ([facility] [place] [id number] from 07-30-2012) had high grade, poorly differentiated endometrial adenocarcinoma with solid and serous papillary components, 2 periaortic nodes negative and 9 right pelvic/11 left pelvic nodes negative. The tumor was 4.8 cm confined to polyp with no definite myometrial involvement, margins clear and no angiolymphatic invasion identified.  Cycle 1 taxol carboplatin was given 08-30-12, 6 cycles of q 3 week regimen given thru 12-13-2012, with gCSF support. Radiation was intracavitary HDR to proximal vagina 30 Gy in 5 fractions from May 27 thru November 27, 2012, by Dr Sondra Come. Patient was followed closely by Dr Polly Cobia after completing adjuvant taxol carboplatin chemotherapy in 12-2012. Patient felt very well, with intentional 70 lb weight loss over a year from Jan thru Dec 2015 on [employer]. She saw Dr Polly Cobia with unremarkable exam in March 2016, however CA 125 was slightly elevated at 36.3, up from previous 29.2. She had CT CAP with contrast 10-16-14 with new left periaortic and left common iliac lymphadenopathy. PET 10-13-23 had hypermetabolic uptake in retroperitoneal nodes from lower poles of kidneys to aortic bifurcation,largest 2.0 x 2.1 cm, as well as uptake in either right supraclavicular node vs right lobe of thyroid (report below). She had FNA of thyroid 10-28-14 with benign tissue, [place] at time of biopsy reportedly showing no right supraclavicular adenopathy. She had CT needle biopsy of left para-aortic node 11-16-14 with path showing serous carcinoma. She began chemotherapy with dose dense carbo taxol on 11-27-14, with carbo at AUC = 5. Over next 3 weeks WBC and platelets were too low for  treatments, despite total 7 doses of granix.     Review of systems as above, also: No SOB or other respiratory symptoms. No LE swelling. Hair has thinned. Remainder of 10 point Review of Systems negative.  Objective:  Vital  signs in last 24 hours: Weight stable at 144.9 lbs. 115/62, HR 67 regular, resp 18 not labored RA, temp 98.5, O2 sat 100  Alert, oriented and appropriate. Ambulatory without difficulty.  Hair thin but no complete alopecia.   HEENT:PERRL, sclerae not icteric. Oral mucosa moist without lesions, posterior pharynx clear.  Neck supple. No JVD.  Lymphatics:no cervical,supraclavicular or inguinal adenopathy Resp: clear to auscultation bilaterally and normal percussion bilaterally Cardio: regular rate and rhythm. No gallop. GI: soft, nontender, not distended, no mass or organomegaly. Normally active bowel sounds. Surgical incisions not remarkable. Musculoskeletal/ Extremities: without pitting edema, cords, tenderness. Kyphosis.  Neuro: no peripheral neuropathy. Otherwise nonfocal. Psych appropriate mood and affect Skin without rash, ecchymosis, petechiae   Lab Results:  Results for orders placed or performed in visit on 01/04/15  CBC with Differential  Result Value Ref Range   WBC 4.0 3.9 - 10.3 10e3/uL   NEUT# 2.5 1.5 - 6.5 10e3/uL   HGB 11.7 11.6 - 15.9 g/dL   HCT 35.0 34.8 - 46.6 %   Platelets 245 145 - 400 10e3/uL   MCV 98.2 79.5 - 101.0 fL   MCH 32.7 25.1 - 34.0 pg   MCHC 33.3 31.5 - 36.0 g/dL   RBC 3.57 (L) 3.70 - 5.45 10e6/uL   RDW 14.9 (H) 11.2 - 14.5 %   lymph# 1.1 0.9 - 3.3 10e3/uL   MONO# 0.3 0.1 - 0.9 10e3/uL   Eosinophils Absolute 0.1 0.0 - 0.5 10e3/uL   Basophils Absolute 0.1 0.0 - 0.1 10e3/uL   NEUT% 62.2 38.4 - 76.8 %   LYMPH% 27.1 14.0 - 49.7 %   MONO% 7.0 0.0 - 14.0 %   EOS% 2.2 0.0 - 7.0 %   BASO% 1.5 0.0 - 2.0 %  Comprehensive metabolic panel (Cmet) - [facility]  Result Value Ref Range   Sodium 142 136 - 145 mEq/L   Potassium 3.9 3.5 - 5.1 mEq/L   Chloride 110 (H) 98 - 109 mEq/L   CO2 30 (H) 22 - 29 mEq/L   Glucose 78 70 - 140 mg/dl   BUN 16.4 7.0 - 26.0 mg/dL   Creatinine 0.7 0.6 - 1.1 mg/dL   Total Bilirubin 0.22 0.20 - 1.20 mg/dL   Alkaline Phosphatase 70  40 - 150 U/L   AST 21 5 - 34 U/L   ALT 25 0 - 55 U/L   Total Protein 6.7 6.4 - 8.3 g/dL   Albumin 3.8 3.5 - 5.0 g/dL   Calcium 9.3 8.4 - 10.4 mg/dL   Anion Gap 3 3 - 11 mEq/L   EGFR 87 (L) >90 ml/min/1.73 m2    CA 125 resulted after visit down to 40, this having been 132 on 11-27-14  Studies/Results:  No results found.  Medications: I have reviewed the patient's current medications.   DISCUSSION  Counts reviewed and have remained stable with single agent Taxol. We will keep lab appointment on Wednesday this week prior to chemotherapy to ensure her counts stay stable given her history of pancytopenia. OK to treat with cycle 2 weekly taxol if ANC >=1.4 and plt >=90-100k. Scheduled for cycle 2 day 15 on 01/07/2015. Granix is scheduled for day 16 and 17 this week  Would add additional granix on day 10  and / or day 17 if ANC <1.5 for day 8 or day 15  Could consider changing regimen with cycle 3 to taxol days 1,8,15 with carbo day 15 then neulasta day 16 with treatment q 28 days.   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 Dose dense carbo taxol begun 11-27-14, counts too low for days 8 and 15 cycle 1.  Will try weekly taxol only for cycle. Will check counts day prior to each treatment, does not need to take premed steroids if low. Will give granix at 480 mcg days 2 and 3, with IVF day 3 if needed. Will treat days 8 and 15 if ANC >=1.4 and platelets >90k. Will give granix days 9 and 16, plus add additional granix days 10 and 17 if ANC <1.5.  Hold EMEND.   Dr Sondra Come suggests radiation after 3 cycles chemo. 2.some diffuse uptake right thyroid on PET, no  specific nodule on [place] and FNA benign. TFTs WNL 11-27-14 3.intentional weight loss of 70 lbs over the 2015 calendar year. BMI now 25 4.chronic constipation, which was a concern during last chemo. Up to date on colonoscopy. Doing ok now 5.HTN, tho has decreased BP meds with weight loss 6.elevated lipids 7.remote minimal tobacco, not enough to qualify for the lung cancer screening program 8.up to date mammograms 9.melanoma in brother   She will have day 15 cycle 2 on 01-07-15. Keep follow-up on 01/14/2015. All questions answered and patient is in agreement with plans as above.  Cc Drs Polly Cobia, Antonietta Barcelona Time spent 20 min including >50% counseling and coordination of care.   Mikey Bussing, NP   01/04/2015, 4:27 PM

## 2015-01-06 ENCOUNTER — Other Ambulatory Visit (HOSPITAL_BASED_OUTPATIENT_CLINIC_OR_DEPARTMENT_OTHER): Payer: 59

## 2015-01-06 ENCOUNTER — Telehealth: Payer: Self-pay

## 2015-01-06 DIAGNOSIS — C541 Malignant neoplasm of endometrium: Secondary | ICD-10-CM | POA: Diagnosis not present

## 2015-01-06 LAB — CBC WITH DIFFERENTIAL/PLATELET
BASO%: 2 % (ref 0.0–2.0)
Basophils Absolute: 0.1 10*3/uL (ref 0.0–0.1)
EOS%: 1.7 % (ref 0.0–7.0)
Eosinophils Absolute: 0.1 10*3/uL (ref 0.0–0.5)
HCT: 36.2 % (ref 34.8–46.6)
HGB: 12.2 g/dL (ref 11.6–15.9)
LYMPH%: 30 % (ref 14.0–49.7)
MCH: 32.9 pg (ref 25.1–34.0)
MCHC: 33.8 g/dL (ref 31.5–36.0)
MCV: 97.1 fL (ref 79.5–101.0)
MONO#: 0.3 10*3/uL (ref 0.1–0.9)
MONO%: 7.6 % (ref 0.0–14.0)
NEUT#: 2.2 10*3/uL (ref 1.5–6.5)
NEUT%: 58.7 % (ref 38.4–76.8)
Platelets: 218 10*3/uL (ref 145–400)
RBC: 3.73 10*6/uL (ref 3.70–5.45)
RDW: 14.9 % — ABNORMAL HIGH (ref 11.2–14.5)
WBC: 3.7 10*3/uL — ABNORMAL LOW (ref 3.9–10.3)
lymph#: 1.1 10*3/uL (ref 0.9–3.3)

## 2015-01-06 LAB — COMPREHENSIVE METABOLIC PANEL (CC13)
ALT: 31 U/L (ref 0–55)
AST: 25 U/L (ref 5–34)
Albumin: 4.1 g/dL (ref 3.5–5.0)
Alkaline Phosphatase: 68 U/L (ref 40–150)
Anion Gap: 7 mEq/L (ref 3–11)
BUN: 17.4 mg/dL (ref 7.0–26.0)
CO2: 28 mEq/L (ref 22–29)
Calcium: 9.6 mg/dL (ref 8.4–10.4)
Chloride: 106 mEq/L (ref 98–109)
Creatinine: 0.8 mg/dL (ref 0.6–1.1)
EGFR: 84 mL/min/{1.73_m2} — ABNORMAL LOW (ref >90)
Glucose: 101 mg/dl (ref 70–140)
Potassium: 4 mEq/L (ref 3.5–5.1)
Sodium: 141 mEq/L (ref 136–145)
Total Bilirubin: 0.31 mg/dL (ref 0.20–1.20)
Total Protein: 7 g/dL (ref 6.4–8.3)

## 2015-01-06 NOTE — Telephone Encounter (Signed)
Left a message for Diane Lozano stating that her lab work was fine today to receive her treatment tomorrow.  She can call the [facility] if she has any questions.

## 2015-01-07 ENCOUNTER — Ambulatory Visit: Payer: 59

## 2015-01-07 ENCOUNTER — Other Ambulatory Visit: Payer: 59

## 2015-01-07 ENCOUNTER — Ambulatory Visit (HOSPITAL_BASED_OUTPATIENT_CLINIC_OR_DEPARTMENT_OTHER): Payer: 59

## 2015-01-07 VITALS — BP 103/57 | HR 70 | Temp 98.7°F | Resp 16

## 2015-01-07 DIAGNOSIS — Z5111 Encounter for antineoplastic chemotherapy: Secondary | ICD-10-CM | POA: Diagnosis not present

## 2015-01-07 DIAGNOSIS — C541 Malignant neoplasm of endometrium: Secondary | ICD-10-CM | POA: Diagnosis not present

## 2015-01-07 DIAGNOSIS — C772 Secondary and unspecified malignant neoplasm of intra-abdominal lymph nodes: Secondary | ICD-10-CM

## 2015-01-07 MED ORDER — DIPHENHYDRAMINE HCL 50 MG/ML IJ SOLN
INTRAMUSCULAR | Status: AC
  Filled 2015-01-07: qty 1

## 2015-01-07 MED ORDER — DIPHENHYDRAMINE HCL 50 MG/ML IJ SOLN
25.0000 mg | Freq: Once | INTRAMUSCULAR | Status: AC
  Administered 2015-01-07: 25 mg via INTRAVENOUS

## 2015-01-07 MED ORDER — FAMOTIDINE IN NACL 20-0.9 MG/50ML-% IV SOLN
20.0000 mg | Freq: Once | INTRAVENOUS | Status: AC
  Administered 2015-01-07: 20 mg via INTRAVENOUS

## 2015-01-07 MED ORDER — SODIUM CHLORIDE 0.9 % IV SOLN
Freq: Once | INTRAVENOUS | Status: AC
  Administered 2015-01-07: 10:00:00 via INTRAVENOUS
  Filled 2015-01-07: qty 4

## 2015-01-07 MED ORDER — FAMOTIDINE IN NACL 20-0.9 MG/50ML-% IV SOLN
INTRAVENOUS | Status: AC
  Filled 2015-01-07: qty 50

## 2015-01-07 MED ORDER — PACLITAXEL CHEMO INJECTION 300 MG/50ML
80.0000 mg/m2 | Freq: Once | INTRAVENOUS | Status: AC
  Administered 2015-01-07: 138 mg via INTRAVENOUS
  Filled 2015-01-07: qty 23

## 2015-01-07 MED ORDER — SODIUM CHLORIDE 0.9 % IV SOLN
Freq: Once | INTRAVENOUS | Status: AC
  Administered 2015-01-07: 10:00:00 via INTRAVENOUS

## 2015-01-07 NOTE — Patient Instructions (Signed)
[facility] Discharge Instructions for Patients Receiving Chemotherapy  Today you received the following chemotherapy agents: Taxol  To help prevent nausea and vomiting after your treatment, we encourage you to take your nausea medication as prescribed by your physician.   If you develop nausea and vomiting that is not controlled by your nausea medication, call the clinic.   BELOW ARE SYMPTOMS THAT SHOULD BE REPORTED IMMEDIATELY:  *FEVER GREATER THAN 100.5 F  *CHILLS WITH OR WITHOUT FEVER  NAUSEA AND VOMITING THAT IS NOT CONTROLLED WITH YOUR NAUSEA MEDICATION  *UNUSUAL SHORTNESS OF BREATH  *UNUSUAL BRUISING OR BLEEDING  TENDERNESS IN MOUTH AND THROAT WITH OR WITHOUT PRESENCE OF ULCERS  *URINARY PROBLEMS  *BOWEL PROBLEMS  UNUSUAL RASH Items with * indicate a potential emergency and should be followed up as soon as possible.  Feel free to call the clinic you have any questions or concerns. The clinic phone number is [phone].  Please show the CHEMO ALERT CARD at check-in to the Emergency Department and triage nurse.   

## 2015-01-08 ENCOUNTER — Ambulatory Visit (HOSPITAL_BASED_OUTPATIENT_CLINIC_OR_DEPARTMENT_OTHER): Payer: 59

## 2015-01-08 VITALS — BP 114/72 | HR 69 | Temp 98.5°F

## 2015-01-08 DIAGNOSIS — Z5189 Encounter for other specified aftercare: Secondary | ICD-10-CM

## 2015-01-08 DIAGNOSIS — C772 Secondary and unspecified malignant neoplasm of intra-abdominal lymph nodes: Secondary | ICD-10-CM

## 2015-01-08 DIAGNOSIS — C541 Malignant neoplasm of endometrium: Secondary | ICD-10-CM | POA: Diagnosis not present

## 2015-01-08 MED ORDER — TBO-FILGRASTIM 480 MCG/0.8ML ~~LOC~~ SOSY
480.0000 ug | PREFILLED_SYRINGE | Freq: Once | SUBCUTANEOUS | Status: AC
  Administered 2015-01-08: 480 ug via SUBCUTANEOUS
  Filled 2015-01-08: qty 0.8

## 2015-01-09 ENCOUNTER — Ambulatory Visit (HOSPITAL_BASED_OUTPATIENT_CLINIC_OR_DEPARTMENT_OTHER): Payer: 59

## 2015-01-09 VITALS — BP 101/54 | HR 69 | Temp 98.2°F | Resp 18

## 2015-01-09 DIAGNOSIS — C541 Malignant neoplasm of endometrium: Secondary | ICD-10-CM

## 2015-01-09 DIAGNOSIS — Z5189 Encounter for other specified aftercare: Secondary | ICD-10-CM | POA: Diagnosis not present

## 2015-01-09 MED ORDER — TBO-FILGRASTIM 480 MCG/0.8ML ~~LOC~~ SOSY
480.0000 ug | PREFILLED_SYRINGE | Freq: Once | SUBCUTANEOUS | Status: AC
  Administered 2015-01-09: 480 ug via SUBCUTANEOUS

## 2015-01-09 NOTE — Patient Instructions (Signed)

## 2015-01-13 ENCOUNTER — Other Ambulatory Visit: Payer: Self-pay | Admitting: Oncology

## 2015-01-13 ENCOUNTER — Telehealth: Payer: Self-pay

## 2015-01-13 ENCOUNTER — Other Ambulatory Visit (HOSPITAL_BASED_OUTPATIENT_CLINIC_OR_DEPARTMENT_OTHER): Payer: 59

## 2015-01-13 DIAGNOSIS — E162 Hypoglycemia, unspecified: Secondary | ICD-10-CM

## 2015-01-13 DIAGNOSIS — C541 Malignant neoplasm of endometrium: Secondary | ICD-10-CM | POA: Diagnosis not present

## 2015-01-13 LAB — CBC WITH DIFFERENTIAL/PLATELET
BASO%: 2 % (ref 0.0–2.0)
Basophils Absolute: 0.1 10*3/uL (ref 0.0–0.1)
EOS%: 3.7 % (ref 0.0–7.0)
Eosinophils Absolute: 0.1 10*3/uL (ref 0.0–0.5)
HCT: 35.2 % (ref 34.8–46.6)
HGB: 11.6 g/dL (ref 11.6–15.9)
LYMPH%: 41.5 % (ref 14.0–49.7)
MCH: 32.2 pg (ref 25.1–34.0)
MCHC: 33 g/dL (ref 31.5–36.0)
MCV: 97.8 fL (ref 79.5–101.0)
MONO#: 0.2 10*3/uL (ref 0.1–0.9)
MONO%: 7 % (ref 0.0–14.0)
NEUT#: 1.4 10*3/uL — ABNORMAL LOW (ref 1.5–6.5)
NEUT%: 45.8 % (ref 38.4–76.8)
Platelets: 175 10*3/uL (ref 145–400)
RBC: 3.6 10*6/uL — ABNORMAL LOW (ref 3.70–5.45)
RDW: 14 % (ref 11.2–14.5)
WBC: 3 10*3/uL — ABNORMAL LOW (ref 3.9–10.3)
lymph#: 1.3 10*3/uL (ref 0.9–3.3)

## 2015-01-13 LAB — COMPREHENSIVE METABOLIC PANEL (CC13)
ALT: 30 U/L (ref 0–55)
AST: 18 U/L (ref 5–34)
Albumin: 3.9 g/dL (ref 3.5–5.0)
Alkaline Phosphatase: 87 U/L (ref 40–150)
Anion Gap: 7 mEq/L (ref 3–11)
BUN: 16.6 mg/dL (ref 7.0–26.0)
CO2: 29 mEq/L (ref 22–29)
Calcium: 9.5 mg/dL (ref 8.4–10.4)
Chloride: 106 mEq/L (ref 98–109)
Creatinine: 0.8 mg/dL (ref 0.6–1.1)
EGFR: 84 mL/min/{1.73_m2} — ABNORMAL LOW (ref >90)
Glucose: 58 mg/dl — ABNORMAL LOW (ref 70–140)
Potassium: 3.9 mEq/L (ref 3.5–5.1)
Sodium: 142 mEq/L (ref 136–145)
Total Bilirubin: 0.31 mg/dL (ref 0.20–1.20)
Total Protein: 6.7 g/dL (ref 6.4–8.3)

## 2015-01-13 NOTE — Telephone Encounter (Signed)
Told Diane Lozano that Dr. Marko Plume said that she will receive the Taxol tomorrow with ANC 1.4 today.  She can go ahead and take decadron premedication for taxol as directed. Stated that her Blood sugar was 58 today.  She states that she feels good.  She is not shaky or diaphoretic.  She ate a sandwich prior to coming for labs today.   Told her Dr. Johny Blamer to eat something every 1-2 hours in the next 24 hours.  Will recheck blood sugar tomorrow prior to visit. Diane Lozano verbalized understanding.

## 2015-01-14 ENCOUNTER — Ambulatory Visit (HOSPITAL_BASED_OUTPATIENT_CLINIC_OR_DEPARTMENT_OTHER): Payer: 59

## 2015-01-14 ENCOUNTER — Telehealth: Payer: Self-pay

## 2015-01-14 ENCOUNTER — Encounter: Payer: Self-pay | Admitting: Oncology

## 2015-01-14 ENCOUNTER — Other Ambulatory Visit (HOSPITAL_BASED_OUTPATIENT_CLINIC_OR_DEPARTMENT_OTHER): Payer: 59

## 2015-01-14 ENCOUNTER — Other Ambulatory Visit: Payer: 59

## 2015-01-14 ENCOUNTER — Ambulatory Visit (HOSPITAL_BASED_OUTPATIENT_CLINIC_OR_DEPARTMENT_OTHER): Payer: 59 | Admitting: Oncology

## 2015-01-14 VITALS — BP 124/74 | HR 68 | Temp 98.6°F | Resp 18 | Ht 63.0 in | Wt 143.1 lb

## 2015-01-14 DIAGNOSIS — T451X5A Adverse effect of antineoplastic and immunosuppressive drugs, initial encounter: Secondary | ICD-10-CM

## 2015-01-14 DIAGNOSIS — D701 Agranulocytosis secondary to cancer chemotherapy: Secondary | ICD-10-CM

## 2015-01-14 DIAGNOSIS — C541 Malignant neoplasm of endometrium: Secondary | ICD-10-CM | POA: Diagnosis not present

## 2015-01-14 DIAGNOSIS — E162 Hypoglycemia, unspecified: Secondary | ICD-10-CM | POA: Diagnosis not present

## 2015-01-14 DIAGNOSIS — C772 Secondary and unspecified malignant neoplasm of intra-abdominal lymph nodes: Secondary | ICD-10-CM | POA: Diagnosis not present

## 2015-01-14 DIAGNOSIS — C7989 Secondary malignant neoplasm of other specified sites: Secondary | ICD-10-CM

## 2015-01-14 DIAGNOSIS — D72819 Decreased white blood cell count, unspecified: Secondary | ICD-10-CM | POA: Diagnosis not present

## 2015-01-14 DIAGNOSIS — R110 Nausea: Secondary | ICD-10-CM

## 2015-01-14 DIAGNOSIS — Z5111 Encounter for antineoplastic chemotherapy: Secondary | ICD-10-CM

## 2015-01-14 DIAGNOSIS — R112 Nausea with vomiting, unspecified: Secondary | ICD-10-CM

## 2015-01-14 LAB — WHOLE BLOOD GLUCOSE
Glucose: 158 mg/dL — ABNORMAL HIGH (ref 70–100)
HRS PC: 15 Hours

## 2015-01-14 MED ORDER — FAMOTIDINE IN NACL 20-0.9 MG/50ML-% IV SOLN
INTRAVENOUS | Status: AC
  Filled 2015-01-14: qty 50

## 2015-01-14 MED ORDER — FAMOTIDINE IN NACL 20-0.9 MG/50ML-% IV SOLN
20.0000 mg | Freq: Once | INTRAVENOUS | Status: AC
  Administered 2015-01-14: 20 mg via INTRAVENOUS

## 2015-01-14 MED ORDER — SODIUM CHLORIDE 0.9 % IV SOLN
Freq: Once | INTRAVENOUS | Status: AC
  Administered 2015-01-14: 12:00:00 via INTRAVENOUS

## 2015-01-14 MED ORDER — DEXTROSE 5 % IV SOLN
80.0000 mg/m2 | Freq: Once | INTRAVENOUS | Status: AC
  Administered 2015-01-14: 138 mg via INTRAVENOUS
  Filled 2015-01-14: qty 23

## 2015-01-14 MED ORDER — SODIUM CHLORIDE 0.9 % IV SOLN
Freq: Once | INTRAVENOUS | Status: AC
  Administered 2015-01-14: 12:00:00 via INTRAVENOUS
  Filled 2015-01-14: qty 8

## 2015-01-14 MED ORDER — DIPHENHYDRAMINE HCL 50 MG/ML IJ SOLN
25.0000 mg | Freq: Once | INTRAMUSCULAR | Status: AC
  Administered 2015-01-14: 25 mg via INTRAVENOUS

## 2015-01-14 MED ORDER — DIPHENHYDRAMINE HCL 50 MG/ML IJ SOLN
INTRAMUSCULAR | Status: AC
  Filled 2015-01-14: qty 1

## 2015-01-14 MED ORDER — PROCHLORPERAZINE MALEATE 10 MG PO TABS: 10.0000 mg | ORAL_TABLET | Freq: Four times a day (QID) | ORAL | Status: DC | PRN

## 2015-01-14 MED ORDER — SODIUM CHLORIDE 0.9 % IV SOLN: Freq: Once | INTRAVENOUS | Status: DC

## 2015-01-14 MED ORDER — CARBOPLATIN CHEMO INTRADERMAL TEST DOSE 100MCG/0.02ML: 100.0000 ug | Freq: Once | INTRADERMAL | Status: DC

## 2015-01-14 NOTE — Telephone Encounter (Signed)
Pt called stating her granix shot has not been scheduled for Monday. Did chart review and found that POF from today has not been handled yet. Called pt back and LVM instructing her to stop by scheduling when she comes in for her injection tomorrow

## 2015-01-14 NOTE — Patient Instructions (Signed)
[facility] Discharge Instructions for Patients Receiving Chemotherapy  Today you received the following chemotherapy agents:  Taxol  To help prevent nausea and vomiting after your treatment, we encourage you to take your nausea medication as prescribed.   If you develop nausea and vomiting that is not controlled by your nausea medication, call the clinic.   BELOW ARE SYMPTOMS THAT SHOULD BE REPORTED IMMEDIATELY:  *FEVER GREATER THAN 100.5 F  *CHILLS WITH OR WITHOUT FEVER  NAUSEA AND VOMITING THAT IS NOT CONTROLLED WITH YOUR NAUSEA MEDICATION  *UNUSUAL SHORTNESS OF BREATH  *UNUSUAL BRUISING OR BLEEDING  TENDERNESS IN MOUTH AND THROAT WITH OR WITHOUT PRESENCE OF ULCERS  *URINARY PROBLEMS  *BOWEL PROBLEMS  UNUSUAL RASH Items with * indicate a potential emergency and should be followed up as soon as possible.  Feel free to call the clinic you have any questions or concerns. The clinic phone number is [phone].  Please show the CHEMO ALERT CARD at check-in to the Emergency Department and triage nurse.   

## 2015-01-14 NOTE — Progress Notes (Signed)
OFFICE PROGRESS NOTE   January 14, 2015   Physicians:Elizabeth Polly Cobia, MD, Gery Pray MD, [name] MD  INTERVAL HISTORY:   Patient is seen, alone for visit, in continuing attention to recurrent endometrial carcinoma for which we have been attempting dose dense carbo taxol chemotherapy, treatment complicated by low WBC despite gCSF. She had taxol on 01-07-15, with granix 480 mg on 8-5 and 01-09-15. ANC is 1.4 today with other counts ok, so will treat today with taxol and follow with 3 doses granix. NOTE no longer using EMEND with taxol due to possible additional suppression  Patient had consultation with Dr Sondra Come on 12-31-14. He would like to do radiation in sandwich fashion after 3 cycles of chemo, then likely 3 additional cycles of chemo. Note area of recurrence has no overlap with previous radiation.   Patient had nausea for ~ a day after last treatment, was able to keep down po fluids. She has no neuropathy. Energy has been good, working usual schedule. She has no abdominal or pelvic discomfort. Bowels are moving regularly. Peripheral IV access has been easier. She has been hypoglycemic intermittently on labs at [facility], including glucose 58 on 01-13-15 despite having eaten sandwich and grapes shortly prior. Note she has had intentional weight loss of 70 lbs during past year. I have asked her to try to eat small amounts every 1-2 hours for now, and will request [facility] dietician instruct also. No bleeding. Hair is thinning.  No central catheter. Peripheral IV access was adequate for previous chemotherapy and she much prefers this to [facility]. No genetics testing CA 125 132 on 11-27-14 (day 1 cycle 1)  ONCOLOGIC HISTORY Patient presented to PCP with intermittent vaginal bleeding since ~ Oct 2013, endometrial biopsy 06-20-12 with complex endometrial hyperplasia with atypia. Pelvic and transvaginal US in  system on 06-26-12 showed endometrial thickening of 2.7 cm, right ovary not clearly seen and  left ovary with normal postmenopausal appearance. She was seen by Dr Christophe Louis and referred to Dr Polly Cobia. She had robotic hysterectomy with BSO and bilateral pelvic and periaortic node dissection by Dr Polly Cobia at [facility] on 07-30-2012. Intraoperatively she had large endometrial polyp with high grade malignancy, no obvious extrauterine disease. Pathology ([facility] [place] [id number] from 07-30-2012) had high grade, poorly differentiated endometrial adenocarcinoma with solid and serous papillary components, 2 periaortic nodes negative and 9 right pelvic/11 left pelvic nodes negative. The tumor was 4.8 cm confined to polyp with no definite myometrial involvement, margins clear and no angiolymphatic invasion identified.  Cycle 1 taxol carboplatin was given 08-30-12, 6 cycles of q 3 week regimen given thru 12-13-2012, with gCSF support. Radiation was intracavitary HDR to proximal vagina 30 Gy in 5 fractions from May 27 thru November 27, 2012, by Dr Sondra Come. Patient was followed closely by Dr Polly Cobia after completing adjuvant taxol carboplatin chemotherapy in 12-2012. Patient felt very well, with intentional 70 lb weight loss over a year from Jan thru Dec 2015 on Weight Watchers. She saw Dr Polly Cobia with unremarkable exam in March 2016, however CA 125 was slightly elevated at 36.3, up from previous 29.2. She had CT CAP with contrast 10-16-14 with new left periaortic and left common iliac lymphadenopathy. PET 7-78-24 had hypermetabolic uptake in retroperitoneal nodes from lower poles of kidneys to aortic bifurcation,largest 2.0 x 2.1 cm, as well as uptake in either right supraclavicular node vs right lobe of thyroid (report below).  FNA of thyroid 10-28-14 with benign tissue, [place] at time of biopsy  reportedly showing no right supraclavicular adenopathy. She had CT needle biopsy of left para-aortic node 11-16-14 with path showing serous carcinoma. She began chemotherapy with dose dense carbo  taxol on 11-27-14, with carbo at AUC = 5. Over next 3 weeks WBC and platelets were too low for treatments, despite total 7 doses of granix.     Review of systems as above, also: NO fever or symptoms of infection. No SOB. No LE swelling. Bladder ok. Sleeping ok Remainder of 10 point Review of Systems negative.  Objective:  Vital signs in last 24 hours:  BP 124/74 mmHg  Pulse 68  Temp(Src) 98.6 F (37 C) (Oral)  Resp 18  Ht 5' 3"  (1.6 m)  Wt 143 lb 1.6 oz (64.91 kg)  BMI 25.36 kg/m2 Weight down 1 lb Alert, oriented and appropriate. Ambulatory without  difficulty. Looks comfortable, respirations not labored. Partial alopecia  HEENT:PERRL, sclerae not icteric. Oral mucosa moist without lesions, posterior pharynx clear.  Neck supple. No JVD.  Lymphatics:no cervical,supraclavicular, axillary or inguinal adenopathy Resp: clear to auscultation bilaterally and normal percussion bilaterally Cardio: regular rate and rhythm. No gallop. GI: soft, nontender, not distended, no mass or organomegaly. Normally active bowel sounds. Surgical incision not remarkable. Musculoskeletal/ Extremities: without pitting edema, cords, tenderness Neuro: no peripheral neuropathy. Otherwise nonfocal. PSYCH appropriate mood and affect Skin without rash, ecchymosis, petechiae   Lab Results:  Results for orders placed or performed in visit on 01/14/15  Glucose, whole blood (CBG)  Result Value Ref Range   Glucose 158 (H) 70 - 100 mg/dL   HRS PC 15 minutes Hours   this blood sugar on steroids for taxol Labs from 01-13-15 WBC 3.0, ANC 1.4, Hgb 11.6, plt 175.  CMET normal with exception of nonfasting glucose 58 and EGFR 84  Last CA 125 was 40 on 12-23-14, this compared with 132 on 11-27-14 and after only day 1 cycle 1 carboplatin + taxol. Will repeat CA 125 with next labs. Obviously if disease is really this sensitive to these drugs, we will continue to try to manage neutropenia.    Studies/Results:  No  results found.  Medications: I have reviewed the patient's current medications. Will add prn compazine since nausea is not as well controlled off of EMEND. We still prefer to hold EMEND. Will keep granix at 480 mcg and increase to 3 doses after each treatment.   DISCUSSION: Patient is very willing to continue treatment with whatever gCSF support is needed. She understands that radiation may depress counts further, but also may be very helpful controlling the recurrent endometrial cancer. She will let us know if she needs IVF after chemo, but will not have these scheduled at least after single agent taxol.   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 If correct CA 125 much lower after single carbo taxol. Continue chemo as possible, including carbo at reduced dose + taxol next week if [place] >=1.5 then. GCSF 480 mcg x at least 3 doses after each. 2.some diffuse uptake right thyroid on PET, no specific nodule on [place] and FNA benign. TFTs WNL 11-27-14 3.intentional weight loss of 70 lbs over the 2015 calendar year. BMI now 25. Hypoglycemia not clearly symptomatic, plan as above 4.chronic constipation, which was a concern during last chemo. Up to date on colonoscopy. Doing ok now 5.BP good without antihypertensives now, follow. Likely improved with weight loss  6.elevated lipids 7.remote minimal tobacco, not enough to qualify for the lung cancer screening program 8.up to date mammograms 9.melanoma in brother   All questions answered and patient is in agreement with recommendations and plans. Chemo and granix orders adjusted. She will have carbo + taxol on 8-18 if ANC >=1.5 and taxol only that day if ANC just 1.4, granix x 3 days after. I will see her back 8-25 with treatment also that day if counts allow. Time spent 30 min  including >50% counseling and coordination of care.  Cc Dr Polly Cobia and Dr Megan Salon, MD   01/14/2015, 11:17 AM

## 2015-01-15 ENCOUNTER — Ambulatory Visit (HOSPITAL_BASED_OUTPATIENT_CLINIC_OR_DEPARTMENT_OTHER): Payer: 59

## 2015-01-15 ENCOUNTER — Other Ambulatory Visit: Payer: Self-pay | Admitting: *Deleted

## 2015-01-15 VITALS — BP 105/58 | HR 83 | Temp 98.3°F

## 2015-01-15 DIAGNOSIS — C541 Malignant neoplasm of endometrium: Secondary | ICD-10-CM | POA: Diagnosis not present

## 2015-01-15 DIAGNOSIS — Z5189 Encounter for other specified aftercare: Secondary | ICD-10-CM | POA: Diagnosis not present

## 2015-01-15 MED ORDER — TBO-FILGRASTIM 480 MCG/0.8ML ~~LOC~~ SOSY
480.0000 ug | PREFILLED_SYRINGE | Freq: Once | SUBCUTANEOUS | Status: AC
  Administered 2015-01-15: 480 ug via SUBCUTANEOUS
  Filled 2015-01-15: qty 0.8

## 2015-01-16 ENCOUNTER — Other Ambulatory Visit: Payer: Self-pay | Admitting: Oncology

## 2015-01-16 ENCOUNTER — Ambulatory Visit: Payer: 59

## 2015-01-16 ENCOUNTER — Ambulatory Visit (HOSPITAL_BASED_OUTPATIENT_CLINIC_OR_DEPARTMENT_OTHER): Payer: 59

## 2015-01-16 VITALS — BP 107/55 | HR 70 | Temp 98.5°F

## 2015-01-16 DIAGNOSIS — C541 Malignant neoplasm of endometrium: Secondary | ICD-10-CM

## 2015-01-16 DIAGNOSIS — C772 Secondary and unspecified malignant neoplasm of intra-abdominal lymph nodes: Secondary | ICD-10-CM

## 2015-01-16 DIAGNOSIS — Z5189 Encounter for other specified aftercare: Secondary | ICD-10-CM

## 2015-01-16 DIAGNOSIS — E162 Hypoglycemia, unspecified: Secondary | ICD-10-CM | POA: Insufficient documentation

## 2015-01-16 DIAGNOSIS — C7989 Secondary malignant neoplasm of other specified sites: Secondary | ICD-10-CM

## 2015-01-16 MED ORDER — TBO-FILGRASTIM 480 MCG/0.8ML ~~LOC~~ SOSY
480.0000 ug | PREFILLED_SYRINGE | Freq: Once | SUBCUTANEOUS | Status: AC
  Administered 2015-01-16: 480 ug via SUBCUTANEOUS

## 2015-01-18 ENCOUNTER — Ambulatory Visit (HOSPITAL_BASED_OUTPATIENT_CLINIC_OR_DEPARTMENT_OTHER): Payer: 59

## 2015-01-18 ENCOUNTER — Telehealth: Payer: Self-pay | Admitting: Oncology

## 2015-01-18 VITALS — BP 116/66 | HR 75 | Temp 98.0°F

## 2015-01-18 DIAGNOSIS — C541 Malignant neoplasm of endometrium: Secondary | ICD-10-CM | POA: Diagnosis not present

## 2015-01-18 DIAGNOSIS — Z5189 Encounter for other specified aftercare: Secondary | ICD-10-CM | POA: Diagnosis not present

## 2015-01-18 MED ORDER — TBO-FILGRASTIM 480 MCG/0.8ML ~~LOC~~ SOSY
480.0000 ug | PREFILLED_SYRINGE | Freq: Once | SUBCUTANEOUS | Status: AC
  Administered 2015-01-18: 480 ug via SUBCUTANEOUS
  Filled 2015-01-18: qty 0.8

## 2015-01-18 NOTE — Telephone Encounter (Signed)
Spoke with patient and she declines the nutrition appointment and will let us know is she changes her mind

## 2015-01-20 ENCOUNTER — Telehealth: Payer: Self-pay

## 2015-01-20 ENCOUNTER — Other Ambulatory Visit (HOSPITAL_BASED_OUTPATIENT_CLINIC_OR_DEPARTMENT_OTHER): Payer: 59

## 2015-01-20 DIAGNOSIS — C7989 Secondary malignant neoplasm of other specified sites: Secondary | ICD-10-CM

## 2015-01-20 DIAGNOSIS — C541 Malignant neoplasm of endometrium: Secondary | ICD-10-CM | POA: Diagnosis not present

## 2015-01-20 DIAGNOSIS — C772 Secondary and unspecified malignant neoplasm of intra-abdominal lymph nodes: Secondary | ICD-10-CM

## 2015-01-20 LAB — COMPREHENSIVE METABOLIC PANEL (CC13)
ALT: 26 U/L (ref 0–55)
AST: 21 U/L (ref 5–34)
Albumin: 3.9 g/dL (ref 3.5–5.0)
Alkaline Phosphatase: 98 U/L (ref 40–150)
Anion Gap: 9 mEq/L (ref 3–11)
BUN: 17 mg/dL (ref 7.0–26.0)
CO2: 26 mEq/L (ref 22–29)
Calcium: 9.7 mg/dL (ref 8.4–10.4)
Chloride: 107 mEq/L (ref 98–109)
Creatinine: 0.8 mg/dL (ref 0.6–1.1)
EGFR: 84 mL/min/{1.73_m2} — ABNORMAL LOW (ref >90)
Glucose: 80 mg/dl (ref 70–140)
Potassium: 3.9 mEq/L (ref 3.5–5.1)
Sodium: 142 mEq/L (ref 136–145)
Total Bilirubin: 0.28 mg/dL (ref 0.20–1.20)
Total Protein: 6.7 g/dL (ref 6.4–8.3)

## 2015-01-20 LAB — CBC WITH DIFFERENTIAL/PLATELET
BASO%: 1.2 % (ref 0.0–2.0)
Basophils Absolute: 0.1 10*3/uL (ref 0.0–0.1)
EOS%: 2.7 % (ref 0.0–7.0)
Eosinophils Absolute: 0.2 10*3/uL (ref 0.0–0.5)
HCT: 33.9 % — ABNORMAL LOW (ref 34.8–46.6)
HGB: 11.3 g/dL — ABNORMAL LOW (ref 11.6–15.9)
LYMPH%: 22.5 % (ref 14.0–49.7)
MCH: 32.8 pg (ref 25.1–34.0)
MCHC: 33.3 g/dL (ref 31.5–36.0)
MCV: 98.5 fL (ref 79.5–101.0)
MONO#: 0.5 10*3/uL (ref 0.1–0.9)
MONO%: 7.2 % (ref 0.0–14.0)
NEUT#: 4.5 10*3/uL (ref 1.5–6.5)
NEUT%: 66.4 % (ref 38.4–76.8)
Platelets: 239 10*3/uL (ref 145–400)
RBC: 3.44 10*6/uL — ABNORMAL LOW (ref 3.70–5.45)
RDW: 14.4 % (ref 11.2–14.5)
WBC: 6.8 10*3/uL (ref 3.9–10.3)
lymph#: 1.5 10*3/uL (ref 0.9–3.3)

## 2015-01-20 NOTE — Telephone Encounter (Signed)
Told Ms. Cavell that her counts today are fine for her treatment tomorrow. ANC = 4.5 and plt = 239 K and chemistries are fine as well.

## 2015-01-21 ENCOUNTER — Other Ambulatory Visit: Payer: 59

## 2015-01-21 ENCOUNTER — Ambulatory Visit (HOSPITAL_BASED_OUTPATIENT_CLINIC_OR_DEPARTMENT_OTHER): Payer: 59

## 2015-01-21 ENCOUNTER — Other Ambulatory Visit: Payer: Self-pay | Admitting: Oncology

## 2015-01-21 VITALS — BP 122/65 | HR 68 | Temp 97.8°F | Resp 16

## 2015-01-21 DIAGNOSIS — Z5111 Encounter for antineoplastic chemotherapy: Secondary | ICD-10-CM

## 2015-01-21 DIAGNOSIS — C772 Secondary and unspecified malignant neoplasm of intra-abdominal lymph nodes: Secondary | ICD-10-CM

## 2015-01-21 DIAGNOSIS — C541 Malignant neoplasm of endometrium: Secondary | ICD-10-CM

## 2015-01-21 LAB — CA 125: CA 125: 40 U/mL — ABNORMAL HIGH (ref <35)

## 2015-01-21 MED ORDER — SODIUM CHLORIDE 0.9 % IV SOLN
Freq: Once | INTRAVENOUS | Status: AC
  Administered 2015-01-21: 12:00:00 via INTRAVENOUS
  Filled 2015-01-21: qty 5

## 2015-01-21 MED ORDER — SODIUM CHLORIDE 0.9 % IV SOLN
Freq: Once | INTRAVENOUS | Status: AC
  Administered 2015-01-21: 10:00:00 via INTRAVENOUS

## 2015-01-21 MED ORDER — SODIUM CHLORIDE 0.9 % IV SOLN
199.0000 mg | Freq: Once | INTRAVENOUS | Status: AC
  Administered 2015-01-21: 200 mg via INTRAVENOUS
  Filled 2015-01-21: qty 20

## 2015-01-21 MED ORDER — CARBOPLATIN CHEMO INTRADERMAL TEST DOSE 100MCG/0.02ML
100.0000 ug | Freq: Once | INTRADERMAL | Status: AC
  Administered 2015-01-21: 100 ug via INTRADERMAL
  Filled 2015-01-21: qty 0.01

## 2015-01-21 MED ORDER — DEXTROSE 5 % IV SOLN
80.0000 mg/m2 | Freq: Once | INTRAVENOUS | Status: AC
  Administered 2015-01-21: 138 mg via INTRAVENOUS
  Filled 2015-01-21: qty 23

## 2015-01-21 MED ORDER — FAMOTIDINE IN NACL 20-0.9 MG/50ML-% IV SOLN
INTRAVENOUS | Status: AC
  Filled 2015-01-21: qty 50

## 2015-01-21 MED ORDER — DIPHENHYDRAMINE HCL 50 MG/ML IJ SOLN
INTRAMUSCULAR | Status: AC
  Filled 2015-01-21: qty 1

## 2015-01-21 MED ORDER — SODIUM CHLORIDE 0.9 % IV SOLN
Freq: Once | INTRAVENOUS | Status: AC
  Administered 2015-01-21: 11:00:00 via INTRAVENOUS
  Filled 2015-01-21: qty 4

## 2015-01-21 MED ORDER — DIPHENHYDRAMINE HCL 50 MG/ML IJ SOLN
25.0000 mg | Freq: Once | INTRAMUSCULAR | Status: AC
Start: 2015-01-21 — End: 2015-01-21
  Administered 2015-01-21: 25 mg via INTRAVENOUS

## 2015-01-21 MED ORDER — FAMOTIDINE IN NACL 20-0.9 MG/50ML-% IV SOLN
20.0000 mg | Freq: Once | INTRAVENOUS | Status: AC
  Administered 2015-01-21: 20 mg via INTRAVENOUS

## 2015-01-22 ENCOUNTER — Ambulatory Visit (HOSPITAL_BASED_OUTPATIENT_CLINIC_OR_DEPARTMENT_OTHER): Payer: 59

## 2015-01-22 VITALS — BP 122/62 | HR 63 | Temp 98.2°F

## 2015-01-22 DIAGNOSIS — C772 Secondary and unspecified malignant neoplasm of intra-abdominal lymph nodes: Secondary | ICD-10-CM | POA: Diagnosis not present

## 2015-01-22 DIAGNOSIS — C541 Malignant neoplasm of endometrium: Secondary | ICD-10-CM | POA: Diagnosis not present

## 2015-01-22 DIAGNOSIS — D702 Other drug-induced agranulocytosis: Secondary | ICD-10-CM

## 2015-01-22 MED ORDER — TBO-FILGRASTIM 480 MCG/0.8ML ~~LOC~~ SOSY
480.0000 ug | PREFILLED_SYRINGE | Freq: Once | SUBCUTANEOUS | Status: AC
  Administered 2015-01-22: 480 ug via SUBCUTANEOUS
  Filled 2015-01-22: qty 0.8

## 2015-01-23 ENCOUNTER — Ambulatory Visit (HOSPITAL_BASED_OUTPATIENT_CLINIC_OR_DEPARTMENT_OTHER): Payer: 59

## 2015-01-23 VITALS — BP 108/62 | HR 67 | Temp 97.0°F | Resp 16

## 2015-01-23 DIAGNOSIS — Z5189 Encounter for other specified aftercare: Secondary | ICD-10-CM | POA: Diagnosis not present

## 2015-01-23 DIAGNOSIS — C541 Malignant neoplasm of endometrium: Secondary | ICD-10-CM | POA: Diagnosis not present

## 2015-01-23 MED ORDER — TBO-FILGRASTIM 480 MCG/0.8ML ~~LOC~~ SOSY
480.0000 ug | PREFILLED_SYRINGE | Freq: Once | SUBCUTANEOUS | Status: AC
  Administered 2015-01-23: 480 ug via SUBCUTANEOUS

## 2015-01-23 NOTE — Patient Instructions (Signed)

## 2015-01-24 ENCOUNTER — Other Ambulatory Visit: Payer: Self-pay | Admitting: Oncology

## 2015-01-25 ENCOUNTER — Ambulatory Visit (HOSPITAL_BASED_OUTPATIENT_CLINIC_OR_DEPARTMENT_OTHER): Payer: 59

## 2015-01-25 VITALS — BP 119/73 | HR 79 | Temp 98.1°F

## 2015-01-25 DIAGNOSIS — D702 Other drug-induced agranulocytosis: Secondary | ICD-10-CM

## 2015-01-25 DIAGNOSIS — C541 Malignant neoplasm of endometrium: Secondary | ICD-10-CM

## 2015-01-25 MED ORDER — TBO-FILGRASTIM 480 MCG/0.8ML ~~LOC~~ SOSY
480.0000 ug | PREFILLED_SYRINGE | Freq: Once | SUBCUTANEOUS | Status: AC
  Administered 2015-01-25: 480 ug via SUBCUTANEOUS
  Filled 2015-01-25: qty 0.8

## 2015-01-27 ENCOUNTER — Other Ambulatory Visit (HOSPITAL_BASED_OUTPATIENT_CLINIC_OR_DEPARTMENT_OTHER): Payer: 59

## 2015-01-27 ENCOUNTER — Telehealth: Payer: Self-pay

## 2015-01-27 DIAGNOSIS — C541 Malignant neoplasm of endometrium: Secondary | ICD-10-CM

## 2015-01-27 LAB — CBC WITH DIFFERENTIAL/PLATELET
BASO%: 0.9 % (ref 0.0–2.0)
Basophils Absolute: 0.1 10*3/uL (ref 0.0–0.1)
EOS%: 2.4 % (ref 0.0–7.0)
Eosinophils Absolute: 0.2 10*3/uL (ref 0.0–0.5)
HCT: 35.2 % (ref 34.8–46.6)
HGB: 11.5 g/dL — ABNORMAL LOW (ref 11.6–15.9)
LYMPH%: 24 % (ref 14.0–49.7)
MCH: 32.5 pg (ref 25.1–34.0)
MCHC: 32.7 g/dL (ref 31.5–36.0)
MCV: 99.4 fL (ref 79.5–101.0)
MONO#: 0.3 10*3/uL (ref 0.1–0.9)
MONO%: 4.4 % (ref 0.0–14.0)
NEUT#: 4.6 10*3/uL (ref 1.5–6.5)
NEUT%: 68.3 % (ref 38.4–76.8)
Platelets: 188 10*3/uL (ref 145–400)
RBC: 3.54 10*6/uL — ABNORMAL LOW (ref 3.70–5.45)
RDW: 14.3 % (ref 11.2–14.5)
WBC: 6.8 10*3/uL (ref 3.9–10.3)
lymph#: 1.6 10*3/uL (ref 0.9–3.3)

## 2015-01-27 LAB — COMPREHENSIVE METABOLIC PANEL (CC13)
ALT: 27 U/L (ref 0–55)
AST: 21 U/L (ref 5–34)
Albumin: 3.9 g/dL (ref 3.5–5.0)
Alkaline Phosphatase: 117 U/L (ref 40–150)
Anion Gap: 9 mEq/L (ref 3–11)
BUN: 14.6 mg/dL (ref 7.0–26.0)
CO2: 26 mEq/L (ref 22–29)
Calcium: 9.6 mg/dL (ref 8.4–10.4)
Chloride: 106 mEq/L (ref 98–109)
Creatinine: 0.8 mg/dL (ref 0.6–1.1)
EGFR: 76 mL/min/{1.73_m2} — ABNORMAL LOW (ref >90)
Glucose: 88 mg/dl (ref 70–140)
Potassium: 3.9 mEq/L (ref 3.5–5.1)
Sodium: 141 mEq/L (ref 136–145)
Total Bilirubin: 0.27 mg/dL (ref 0.20–1.20)
Total Protein: 6.7 g/dL (ref 6.4–8.3)

## 2015-01-27 NOTE — Telephone Encounter (Signed)
-----   Message from [name], MD sent at 01/24/2015  7:58 PM EDT ----- Will have labs 8-24 to see if ok to treat on 8-25 Please can you help me watch out for these, and will need to let her know if ok to take steroids

## 2015-01-27 NOTE — Telephone Encounter (Signed)
Left message in Diane Lozano's cell vm that her counts are fine for her treatment tomorrow.  She can call back to [facility] if she has any questions.

## 2015-01-28 ENCOUNTER — Telehealth: Payer: Self-pay | Admitting: Oncology

## 2015-01-28 ENCOUNTER — Ambulatory Visit (HOSPITAL_BASED_OUTPATIENT_CLINIC_OR_DEPARTMENT_OTHER): Payer: 59 | Admitting: Oncology

## 2015-01-28 ENCOUNTER — Ambulatory Visit (HOSPITAL_BASED_OUTPATIENT_CLINIC_OR_DEPARTMENT_OTHER): Payer: 59

## 2015-01-28 ENCOUNTER — Encounter: Payer: Self-pay | Admitting: Oncology

## 2015-01-28 ENCOUNTER — Telehealth: Payer: Self-pay | Admitting: *Deleted

## 2015-01-28 ENCOUNTER — Other Ambulatory Visit: Payer: 59

## 2015-01-28 VITALS — BP 120/69 | HR 70 | Temp 98.6°F | Resp 18 | Ht 63.0 in | Wt 143.9 lb

## 2015-01-28 DIAGNOSIS — R112 Nausea with vomiting, unspecified: Secondary | ICD-10-CM | POA: Diagnosis not present

## 2015-01-28 DIAGNOSIS — C7989 Secondary malignant neoplasm of other specified sites: Secondary | ICD-10-CM

## 2015-01-28 DIAGNOSIS — C772 Secondary and unspecified malignant neoplasm of intra-abdominal lymph nodes: Secondary | ICD-10-CM

## 2015-01-28 DIAGNOSIS — T451X5A Adverse effect of antineoplastic and immunosuppressive drugs, initial encounter: Secondary | ICD-10-CM

## 2015-01-28 DIAGNOSIS — D701 Agranulocytosis secondary to cancer chemotherapy: Secondary | ICD-10-CM | POA: Diagnosis not present

## 2015-01-28 DIAGNOSIS — C541 Malignant neoplasm of endometrium: Secondary | ICD-10-CM

## 2015-01-28 DIAGNOSIS — Z5111 Encounter for antineoplastic chemotherapy: Secondary | ICD-10-CM | POA: Diagnosis not present

## 2015-01-28 MED ORDER — PACLITAXEL CHEMO INJECTION 300 MG/50ML
80.0000 mg/m2 | Freq: Once | INTRAVENOUS | Status: AC
  Administered 2015-01-28: 138 mg via INTRAVENOUS
  Filled 2015-01-28: qty 23

## 2015-01-28 MED ORDER — FAMOTIDINE IN NACL 20-0.9 MG/50ML-% IV SOLN
20.0000 mg | Freq: Once | INTRAVENOUS | Status: AC
  Administered 2015-01-28: 20 mg via INTRAVENOUS

## 2015-01-28 MED ORDER — DIPHENHYDRAMINE HCL 50 MG/ML IJ SOLN
INTRAMUSCULAR | Status: AC
Start: 2015-01-28 — End: 2015-01-28
  Filled 2015-01-28: qty 1

## 2015-01-28 MED ORDER — DEXAMETHASONE 4 MG PO TABS: ORAL_TABLET | ORAL | Status: DC

## 2015-01-28 MED ORDER — FAMOTIDINE IN NACL 20-0.9 MG/50ML-% IV SOLN
INTRAVENOUS | Status: AC
  Filled 2015-01-28: qty 50

## 2015-01-28 MED ORDER — DIPHENHYDRAMINE HCL 50 MG/ML IJ SOLN
25.0000 mg | Freq: Once | INTRAMUSCULAR | Status: AC
  Administered 2015-01-28: 25 mg via INTRAVENOUS

## 2015-01-28 MED ORDER — SODIUM CHLORIDE 0.9 % IV SOLN
Freq: Once | INTRAVENOUS | Status: AC
Start: 2015-01-28 — End: 2015-01-28
  Administered 2015-01-28: 12:00:00 via INTRAVENOUS

## 2015-01-28 MED ORDER — SODIUM CHLORIDE 0.9 % IV SOLN
Freq: Once | INTRAVENOUS | Status: AC
  Administered 2015-01-28: 12:00:00 via INTRAVENOUS
  Filled 2015-01-28: qty 4

## 2015-01-28 NOTE — Telephone Encounter (Signed)
Per staff message and POF I have scheduled appts. Advised scheduler of appts and to move injections  JMW

## 2015-01-28 NOTE — Telephone Encounter (Signed)
s.w. pt and confirmed appts....she will get new sched tomorrow

## 2015-01-28 NOTE — Telephone Encounter (Signed)
Per staff message and POF I have scheduled appts. Advised scheduler of appts. JMW  

## 2015-01-28 NOTE — Telephone Encounter (Signed)
Gave and printed appt scheda dna vs for pt for Aug thru sEpt.

## 2015-01-28 NOTE — Progress Notes (Signed)
OFFICE PROGRESS NOTE   January 28, 2015   Physicians:Elizabeth Polly Cobia, MD, Gery Pray MD, [name] MD  INTERVAL HISTORY:   Patient is seen, alone for visit, continuing dose dense carboplatin taxol for recurrent endometrial carcinoma. Counts finally are maintaining adequately, using increased dose granix. She is tolerating treatment well, remains asymptomatic from the recurrent disease and CA 125 marker has improved even with limited chemotherapy possible initially. When the recurrent disease was found 11-2014, Dr Polly Cobia recommended 3 cycles chemotherapy then radiation in sandwich fashion prior to additional chemotherapy. With delays and chemo adjustments due to neutropenia as we began, will continue weekly treatment thru next [name] + taxol on 02-18-15.   Patient tolerated carboplatin + taxol on 02-21-15 well overall, did have IV EMEND with antiemetics, and granix 480 mcg x 3 beginning 01-22-15. She had some aches with granix, used claritin and ibuprofen at hs. She had mild swelling right forearm above site of IV access, no pain or erythema, improved today; peripheral IV access has not been difficult. She was more fatigued outdoors in heat this weekend. She has no peripheral neuropathy.    No central catheter.  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  ONCOLOGIC HISTORY Patient presented to PCP with intermittent vaginal bleeding since ~ Oct 2013, endometrial biopsy 06-20-12 with complex endometrial hyperplasia with atypia. Pelvic and transvaginal US in [employer] system on 06-26-12 showed endometrial thickening of 2.7 cm, right ovary not clearly seen and left ovary with normal postmenopausal appearance. She was seen by Dr Christophe Louis and referred to Dr Polly Cobia. She had robotic hysterectomy with BSO and bilateral pelvic and periaortic node  dissection by Dr Polly Cobia at [facility] on 07-30-2012. Intraoperatively she had large endometrial polyp with high grade malignancy, no obvious extrauterine disease. Pathology ([facility] [place] [id number] from 07-30-2012) had high grade, poorly differentiated endometrial adenocarcinoma with solid and serous papillary components, 2 periaortic nodes negative and 9 right pelvic/11 left pelvic nodes negative. The tumor was 4.8 cm confined to polyp with no definite myometrial involvement, margins clear and no angiolymphatic invasion identified.  Cycle 1 taxol carboplatin was given 08-30-12, 6 cycles of q 3 week regimen given thru 12-13-2012, with gCSF support. Radiation was intracavitary HDR to proximal vagina 30 Gy in 5 fractions from May 27 thru November 27, 2012, by Dr Sondra Come. Patient was followed closely by Dr Polly Cobia after completing adjuvant taxol carboplatin chemotherapy in 12-2012. Patient felt very well, with intentional 70 lb weight loss over a year from Jan thru Dec 2015 on Weight Watchers. She saw Dr Polly Cobia with unremarkable exam in March 2016, however CA 125 was slightly elevated at 36.3, up from previous 29.2. She had CT CAP with contrast 10-16-14 with new left periaortic and left common iliac lymphadenopathy. PET 1-91-47 had hypermetabolic uptake in retroperitoneal nodes from lower poles of kidneys to aortic bifurcation,largest 2.0 x 2.1 cm, as well as uptake in either right supraclavicular node vs right lobe of thyroid (report below). FNA of thyroid 10-28-14 with benign tissue, [place] at time of biopsy reportedly showing no right supraclavicular adenopathy. She had CT needle biopsy of left para-aortic node 11-16-14 with path showing serous carcinoma. She began chemotherapy with dose dense carbo taxol on 11-27-14, with carbo at AUC = 5. Over next 3 weeks WBC and platelets were too  low for treatments, despite total 7 doses of granix.      Review of systems as above,  also: Bladder fine. No bleeding. No SOB. No pain.Remainder of 10 point Review of Systems negative.  Objective:  Vital signs in last 24 hours:  BP 120/69 mmHg  Pulse 70  Temp(Src) 98.6 F (37 C) (Oral)  Resp 18  Ht _0  (1.6 m)  Wt 143 lb 14.4 oz (65.273 kg)  BMI 25.50 kg/m2  SpO2 99% Weight stable Alert, oriented and appropriate. Easily mbulatory. Alopecia  HEENT:PERRL, sclerae not icteric. Oral mucosa moist without lesions, posterior pharynx clear.  Neck supple. No JVD.  Lymphatics:no cervical,supraclavicular, axillary or inguinal adenopathy Resp: clear to auscultation bilaterally and normal percussion bilaterally Cardio: regular rate and rhythm. No gallop. GI: soft, nontender, not distended, no mass or organomegaly. Normally active bowel sounds. Surgical incision not remarkable. Musculoskeletal/ Extremities: very minimal swelling right wrist/ lower forearm compared with left, no erythema, cords, tenderness. No LE swelling Neuro: no peripheral neuropathy. Otherwise nonfocal.PSYCH appropriate mood and affect Skin without rash, ecchymosis, petechiae   Lab Results:  Results for orders placed or performed in visit on 01/27/15  CBC with Differential  Result Value Ref Range   WBC 6.8 3.9 - 10.3 10e3/uL   NEUT# 4.6 1.5 - 6.5 10e3/uL   HGB 11.5 (L) 11.6 - 15.9 g/dL   HCT 35.2 34.8 - 46.6 %   Platelets 188 145 - 400 10e3/uL   MCV 99.4 79.5 - 101.0 fL   MCH 32.5 25.1 - 34.0 pg   MCHC 32.7 31.5 - 36.0 g/dL   RBC 3.54 (L) 3.70 - 5.45 10e6/uL   RDW 14.3 11.2 - 14.5 %   lymph# 1.6 0.9 - 3.3 10e3/uL   MONO# 0.3 0.1 - 0.9 10e3/uL   Eosinophils Absolute 0.2 0.0 - 0.5 10e3/uL   Basophils Absolute 0.1 0.0 - 0.1 10e3/uL   NEUT% 68.3 38.4 - 76.8 %   LYMPH% 24.0 14.0 - 49.7 %   MONO% 4.4 0.0 - 14.0 %   EOS% 2.4 0.0 - 7.0 %   BASO% 0.9 0.0 - 2.0 %  Comprehensive metabolic panel (Cmet) - [facility]  Result Value Ref Range   Sodium 141 136 - 145 mEq/L   Potassium 3.9 3.5 - 5.1 mEq/L    Chloride 106 98 - 109 mEq/L   CO2 26 22 - 29 mEq/L   Glucose 88 70 - 140 mg/dl   BUN 14.6 7.0 - 26.0 mg/dL   Creatinine 0.8 0.6 - 1.1 mg/dL   Total Bilirubin 0.27 0.20 - 1.20 mg/dL   Alkaline Phosphatase 117 40 - 150 U/L   AST 21 5 - 34 U/L   ALT 27 0 - 55 U/L   Total Protein 6.7 6.4 - 8.3 g/dL   Albumin 3.9 3.5 - 5.0 g/dL   Calcium 9.6 8.4 - 10.4 mg/dL   Anion Gap 9 3 - 11 mEq/L   EGFR 76 (L) >90 ml/min/1.73 m2    CA 125 132 on 11-27-14, 40 on 12-23-14 and 40 on 01-20-15.  Studies/Results:  No results found.  Medications: I have reviewed the patient's current medications. See discussion for EMEND and granix  DISCUSSION: patient in agreement with continuing treatment thru 9-15 to include a third carboplatin, then anticipate radiation prior to another 3 cycles. Will continue granix 480 mcg x 3 doses after each treatment. Will use EMEND on days of carboplatin (could consider adding this back other days if counts maintaining and if nausea is  more bothersome).  Patient understands that she will see Dr Polly Cobia after these treatments are completed. She is not presently scheduled back to that office.   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 Present gCSF finally allowing her to keep on schedule with dose dense carbo taxol, planned thru 02-18-15, then RT prior to additional chemo. CA 125 lower since starting treatment.  2.chemo neutropenia: continue granix 480 mcg x 3 doses after each treatment. Note ANC maintained even with EMEND last week. 3.intentional weight loss of 70 lbs over the 2015 calendar year. BMI now 25. Hypoglycemia intermittently by labs, not clearly symptomatic 4.chronic constipation, which was a concern during last chemo. Up to date on colonoscopy.  5.BP good without antihypertensives now, follow.  Likely improved with weight loss  6.elevated lipids 7.remote minimal tobacco, not enough to qualify for the lung cancer screening program 8.up to date mammograms 9.melanoma in brother 1some diffuse uptake right thyroid on PET, no specific nodule on [place] and FNA benign. TFTs WNL 6-24-160.  All questions answered and patient is in agreement with recommendations and plans. Chemo and granix orders placed. Message to radiation oncology. Cc Drs Polly Cobia and Sabra Heck. Time spent 30 min including >50% counseling and coordination of care.   [name], MD   01/28/2015, 7:34 PM

## 2015-01-28 NOTE — Patient Instructions (Signed)
[facility] Discharge Instructions for Patients Receiving Chemotherapy  Today you received the following chemotherapy agents: Taxol.  To help prevent nausea and vomiting after your treatment, we encourage you to take your nausea medication: Compazine. Take one every 6 hours as needed.   If you develop nausea and vomiting that is not controlled by your nausea medication, call the clinic.   BELOW ARE SYMPTOMS THAT SHOULD BE REPORTED IMMEDIATELY:  *FEVER GREATER THAN 100.5 F  *CHILLS WITH OR WITHOUT FEVER  NAUSEA AND VOMITING THAT IS NOT CONTROLLED WITH YOUR NAUSEA MEDICATION  *UNUSUAL SHORTNESS OF BREATH  *UNUSUAL BRUISING OR BLEEDING  TENDERNESS IN MOUTH AND THROAT WITH OR WITHOUT PRESENCE OF ULCERS  *URINARY PROBLEMS  *BOWEL PROBLEMS  UNUSUAL RASH Items with * indicate a potential emergency and should be followed up as soon as possible.  Feel free to call the clinic should you have any questions or concerns. The clinic phone number is [phone].  Please show the CHEMO ALERT CARD at check-in to the Emergency Department and triage nurse.   

## 2015-01-29 ENCOUNTER — Ambulatory Visit (HOSPITAL_BASED_OUTPATIENT_CLINIC_OR_DEPARTMENT_OTHER): Payer: 59

## 2015-01-29 VITALS — BP 121/70 | HR 75 | Temp 98.2°F

## 2015-01-29 DIAGNOSIS — D701 Agranulocytosis secondary to cancer chemotherapy: Secondary | ICD-10-CM | POA: Insufficient documentation

## 2015-01-29 DIAGNOSIS — C541 Malignant neoplasm of endometrium: Secondary | ICD-10-CM

## 2015-01-29 DIAGNOSIS — D702 Other drug-induced agranulocytosis: Secondary | ICD-10-CM

## 2015-01-29 DIAGNOSIS — C772 Secondary and unspecified malignant neoplasm of intra-abdominal lymph nodes: Secondary | ICD-10-CM

## 2015-01-29 DIAGNOSIS — T451X5A Adverse effect of antineoplastic and immunosuppressive drugs, initial encounter: Secondary | ICD-10-CM | POA: Insufficient documentation

## 2015-01-29 MED ORDER — TBO-FILGRASTIM 480 MCG/0.8ML ~~LOC~~ SOSY
480.0000 ug | PREFILLED_SYRINGE | Freq: Once | SUBCUTANEOUS | Status: AC
  Administered 2015-01-29: 480 ug via SUBCUTANEOUS
  Filled 2015-01-29: qty 0.8

## 2015-01-30 ENCOUNTER — Ambulatory Visit (HOSPITAL_BASED_OUTPATIENT_CLINIC_OR_DEPARTMENT_OTHER): Payer: 59

## 2015-01-30 VITALS — BP 122/64 | HR 68 | Temp 98.2°F | Resp 18

## 2015-01-30 DIAGNOSIS — C541 Malignant neoplasm of endometrium: Secondary | ICD-10-CM

## 2015-01-30 DIAGNOSIS — D702 Other drug-induced agranulocytosis: Secondary | ICD-10-CM | POA: Diagnosis not present

## 2015-01-30 MED ORDER — TBO-FILGRASTIM 480 MCG/0.8ML ~~LOC~~ SOSY
480.0000 ug | PREFILLED_SYRINGE | Freq: Once | SUBCUTANEOUS | Status: AC
  Administered 2015-01-30: 480 ug via SUBCUTANEOUS

## 2015-02-01 ENCOUNTER — Ambulatory Visit (HOSPITAL_BASED_OUTPATIENT_CLINIC_OR_DEPARTMENT_OTHER): Payer: 59

## 2015-02-01 VITALS — BP 118/67 | HR 71 | Temp 98.2°F

## 2015-02-01 DIAGNOSIS — C541 Malignant neoplasm of endometrium: Secondary | ICD-10-CM

## 2015-02-01 DIAGNOSIS — D701 Agranulocytosis secondary to cancer chemotherapy: Secondary | ICD-10-CM | POA: Diagnosis not present

## 2015-02-01 MED ORDER — TBO-FILGRASTIM 480 MCG/0.8ML ~~LOC~~ SOSY
480.0000 ug | PREFILLED_SYRINGE | Freq: Once | SUBCUTANEOUS | Status: AC
  Administered 2015-02-01: 480 ug via SUBCUTANEOUS
  Filled 2015-02-01: qty 0.8

## 2015-02-03 ENCOUNTER — Telehealth: Payer: Self-pay

## 2015-02-03 ENCOUNTER — Other Ambulatory Visit (HOSPITAL_BASED_OUTPATIENT_CLINIC_OR_DEPARTMENT_OTHER): Payer: 59

## 2015-02-03 ENCOUNTER — Other Ambulatory Visit: Payer: 59

## 2015-02-03 DIAGNOSIS — C541 Malignant neoplasm of endometrium: Secondary | ICD-10-CM

## 2015-02-03 LAB — CBC WITH DIFFERENTIAL/PLATELET
BASO%: 0.9 % (ref 0.0–2.0)
Basophils Absolute: 0.1 10*3/uL (ref 0.0–0.1)
EOS%: 1.7 % (ref 0.0–7.0)
Eosinophils Absolute: 0.1 10*3/uL (ref 0.0–0.5)
HCT: 32.1 % — ABNORMAL LOW (ref 34.8–46.6)
HGB: 10.8 g/dL — ABNORMAL LOW (ref 11.6–15.9)
LYMPH%: 23.5 % (ref 14.0–49.7)
MCH: 33.2 pg (ref 25.1–34.0)
MCHC: 33.6 g/dL (ref 31.5–36.0)
MCV: 98.8 fL (ref 79.5–101.0)
MONO#: 0.4 10*3/uL (ref 0.1–0.9)
MONO%: 5.7 % (ref 0.0–14.0)
NEUT#: 4.8 10*3/uL (ref 1.5–6.5)
NEUT%: 68.2 % (ref 38.4–76.8)
Platelets: 199 10*3/uL (ref 145–400)
RBC: 3.25 10*6/uL — ABNORMAL LOW (ref 3.70–5.45)
RDW: 14.4 % (ref 11.2–14.5)
WBC: 7 10*3/uL (ref 3.9–10.3)
lymph#: 1.6 10*3/uL (ref 0.9–3.3)

## 2015-02-03 LAB — COMPREHENSIVE METABOLIC PANEL (CC13)
ALT: 33 U/L (ref 0–55)
AST: 25 U/L (ref 5–34)
Albumin: 3.9 g/dL (ref 3.5–5.0)
Alkaline Phosphatase: 106 U/L (ref 40–150)
Anion Gap: 6 mEq/L (ref 3–11)
BUN: 15.7 mg/dL (ref 7.0–26.0)
CO2: 28 mEq/L (ref 22–29)
Calcium: 9.6 mg/dL (ref 8.4–10.4)
Chloride: 107 mEq/L (ref 98–109)
Creatinine: 0.7 mg/dL (ref 0.6–1.1)
EGFR: 89 mL/min/{1.73_m2} — ABNORMAL LOW (ref >90)
Glucose: 96 mg/dl (ref 70–140)
Potassium: 3.9 mEq/L (ref 3.5–5.1)
Sodium: 141 mEq/L (ref 136–145)
Total Bilirubin: 0.33 mg/dL (ref 0.20–1.20)
Total Protein: 6.6 g/dL (ref 6.4–8.3)

## 2015-02-03 NOTE — Telephone Encounter (Signed)
Ms. Diane Lozano wondered why she was receiving taxol only on 02-11-15.  She is counting that day as D1 Cycle 3 and should receive carbo/taxol  that day as her last carboplatin was 01-21-15 (D1 Cycle 2 in patient's logging of cycles) Diane Lozano is thinking that 02-04-15 treatment is D15 Cycle 2.  Carbo/taxol together is on D1 of each cycle. Told her that the office note from 01-28-15 talked about getting in a third dose of [name] and going through 02-18-15 with treatments prior to radiation. Told her that a message would be sent to Diane Lozano to review and clarify . (Synopsis of treatment is counting treatments  differently then how patient stated).   Will inform Diane Lozano with Diane Lozano response.

## 2015-02-03 NOTE — Telephone Encounter (Signed)
Told Diane Lozano  that her her [place] is 4.8 and Plt count is 199K.today.  She is fine for her treatment tomorrow.

## 2015-02-04 ENCOUNTER — Telehealth: Payer: Self-pay | Admitting: Oncology

## 2015-02-04 ENCOUNTER — Other Ambulatory Visit: Payer: 59

## 2015-02-04 ENCOUNTER — Other Ambulatory Visit: Payer: Self-pay | Admitting: Oncology

## 2015-02-04 ENCOUNTER — Ambulatory Visit (HOSPITAL_BASED_OUTPATIENT_CLINIC_OR_DEPARTMENT_OTHER): Payer: 59

## 2015-02-04 VITALS — BP 108/56 | HR 72 | Temp 97.9°F | Resp 18

## 2015-02-04 DIAGNOSIS — Z5111 Encounter for antineoplastic chemotherapy: Secondary | ICD-10-CM | POA: Diagnosis not present

## 2015-02-04 DIAGNOSIS — C541 Malignant neoplasm of endometrium: Secondary | ICD-10-CM | POA: Diagnosis not present

## 2015-02-04 MED ORDER — DEXTROSE 5 % IV SOLN
80.0000 mg/m2 | Freq: Once | INTRAVENOUS | Status: AC
  Administered 2015-02-04: 138 mg via INTRAVENOUS
  Filled 2015-02-04: qty 23

## 2015-02-04 MED ORDER — DIPHENHYDRAMINE HCL 50 MG/ML IJ SOLN
INTRAMUSCULAR | Status: AC
  Filled 2015-02-04: qty 1

## 2015-02-04 MED ORDER — FAMOTIDINE IN NACL 20-0.9 MG/50ML-% IV SOLN
20.0000 mg | Freq: Once | INTRAVENOUS | Status: AC
  Administered 2015-02-04: 20 mg via INTRAVENOUS

## 2015-02-04 MED ORDER — SODIUM CHLORIDE 0.9 % IV SOLN
Freq: Once | INTRAVENOUS | Status: AC
  Administered 2015-02-04: 15:00:00 via INTRAVENOUS

## 2015-02-04 MED ORDER — DIPHENHYDRAMINE HCL 50 MG/ML IJ SOLN
25.0000 mg | Freq: Once | INTRAMUSCULAR | Status: AC
  Administered 2015-02-04: 25 mg via INTRAVENOUS

## 2015-02-04 MED ORDER — SODIUM CHLORIDE 0.9 % IV SOLN
Freq: Once | INTRAVENOUS | Status: AC
  Administered 2015-02-04: 16:00:00 via INTRAVENOUS
  Filled 2015-02-04: qty 8

## 2015-02-04 MED ORDER — FAMOTIDINE IN NACL 20-0.9 MG/50ML-% IV SOLN
INTRAVENOUS | Status: AC
  Filled 2015-02-04: qty 50

## 2015-02-04 NOTE — Patient Instructions (Signed)
[facility] Discharge Instructions for Patients Receiving Chemotherapy  Today you received the following chemotherapy agents:  Taxol  To help prevent nausea and vomiting after your treatment, we encourage you to take your nausea medication as prescribed.   If you develop nausea and vomiting that is not controlled by your nausea medication, call the clinic.   BELOW ARE SYMPTOMS THAT SHOULD BE REPORTED IMMEDIATELY:  *FEVER GREATER THAN 100.5 F  *CHILLS WITH OR WITHOUT FEVER  NAUSEA AND VOMITING THAT IS NOT CONTROLLED WITH YOUR NAUSEA MEDICATION  *UNUSUAL SHORTNESS OF BREATH  *UNUSUAL BRUISING OR BLEEDING  TENDERNESS IN MOUTH AND THROAT WITH OR WITHOUT PRESENCE OF ULCERS  *URINARY PROBLEMS  *BOWEL PROBLEMS  UNUSUAL RASH Items with * indicate a potential emergency and should be followed up as soon as possible.  Feel free to call the clinic you have any questions or concerns. The clinic phone number is [phone].  Please show the CHEMO ALERT CARD at check-in to the Emergency Department and triage nurse.   

## 2015-02-04 NOTE — Telephone Encounter (Signed)
Called and left a message that her schedule has changed and to get a new avs/calendar  anne

## 2015-02-04 NOTE — Telephone Encounter (Signed)
Re timing of next carboplatin  Patient is correct, next [name] should be 3 weeks from 8-18 = 9-8. POF redone for schedulers now  Please let her know schedulers will be in touch with her.   thanks

## 2015-02-05 ENCOUNTER — Ambulatory Visit: Payer: 59

## 2015-02-05 ENCOUNTER — Ambulatory Visit (HOSPITAL_BASED_OUTPATIENT_CLINIC_OR_DEPARTMENT_OTHER): Payer: 59

## 2015-02-05 VITALS — BP 116/70 | HR 71 | Temp 98.4°F

## 2015-02-05 DIAGNOSIS — D701 Agranulocytosis secondary to cancer chemotherapy: Secondary | ICD-10-CM | POA: Diagnosis not present

## 2015-02-05 DIAGNOSIS — C541 Malignant neoplasm of endometrium: Secondary | ICD-10-CM

## 2015-02-05 DIAGNOSIS — C772 Secondary and unspecified malignant neoplasm of intra-abdominal lymph nodes: Secondary | ICD-10-CM | POA: Diagnosis not present

## 2015-02-05 MED ORDER — TBO-FILGRASTIM 480 MCG/0.8ML ~~LOC~~ SOSY
480.0000 ug | PREFILLED_SYRINGE | Freq: Once | SUBCUTANEOUS | Status: AC
  Administered 2015-02-05: 480 ug via SUBCUTANEOUS
  Filled 2015-02-05: qty 0.8

## 2015-02-05 NOTE — Telephone Encounter (Signed)
Told Diane Lozano that [name] timing would be 02-11-15 as noted below by Dr. Marko Plume. [name] would like to know the rational for not receiving D8 Cycle 3 on 02-18-15.

## 2015-02-05 NOTE — Addendum Note (Signed)
Addended by: Baruch Merl on: 02/05/2015 10:20 AM   Modules accepted: Orders

## 2015-02-06 ENCOUNTER — Ambulatory Visit (HOSPITAL_BASED_OUTPATIENT_CLINIC_OR_DEPARTMENT_OTHER): Payer: 59

## 2015-02-06 VITALS — BP 99/56 | HR 67 | Temp 99.0°F | Resp 18

## 2015-02-06 DIAGNOSIS — C541 Malignant neoplasm of endometrium: Secondary | ICD-10-CM | POA: Diagnosis not present

## 2015-02-06 DIAGNOSIS — Z5189 Encounter for other specified aftercare: Secondary | ICD-10-CM

## 2015-02-06 MED ORDER — TBO-FILGRASTIM 480 MCG/0.8ML ~~LOC~~ SOSY
480.0000 ug | PREFILLED_SYRINGE | Freq: Once | SUBCUTANEOUS | Status: AC
  Administered 2015-02-06: 480 ug via SUBCUTANEOUS

## 2015-02-09 ENCOUNTER — Ambulatory Visit (HOSPITAL_BASED_OUTPATIENT_CLINIC_OR_DEPARTMENT_OTHER): Payer: 59

## 2015-02-09 VITALS — BP 107/56 | HR 74 | Temp 98.3°F

## 2015-02-09 DIAGNOSIS — Z5189 Encounter for other specified aftercare: Secondary | ICD-10-CM | POA: Diagnosis not present

## 2015-02-09 DIAGNOSIS — C541 Malignant neoplasm of endometrium: Secondary | ICD-10-CM | POA: Diagnosis not present

## 2015-02-09 MED ORDER — TBO-FILGRASTIM 480 MCG/0.8ML ~~LOC~~ SOSY
480.0000 ug | PREFILLED_SYRINGE | Freq: Once | SUBCUTANEOUS | Status: AC
  Administered 2015-02-09: 480 ug via SUBCUTANEOUS
  Filled 2015-02-09: qty 0.8

## 2015-02-10 ENCOUNTER — Telehealth: Payer: Self-pay

## 2015-02-10 ENCOUNTER — Other Ambulatory Visit (HOSPITAL_BASED_OUTPATIENT_CLINIC_OR_DEPARTMENT_OTHER): Payer: 59

## 2015-02-10 ENCOUNTER — Other Ambulatory Visit: Payer: Self-pay | Admitting: Oncology

## 2015-02-10 DIAGNOSIS — C541 Malignant neoplasm of endometrium: Secondary | ICD-10-CM

## 2015-02-10 LAB — COMPREHENSIVE METABOLIC PANEL (CC13)
ALT: 22 U/L (ref 0–55)
AST: 18 U/L (ref 5–34)
Albumin: 4.1 g/dL (ref 3.5–5.0)
Alkaline Phosphatase: 118 U/L (ref 40–150)
Anion Gap: 9 mEq/L (ref 3–11)
BUN: 17.7 mg/dL (ref 7.0–26.0)
CO2: 28 mEq/L (ref 22–29)
Calcium: 9.8 mg/dL (ref 8.4–10.4)
Chloride: 104 mEq/L (ref 98–109)
Creatinine: 0.8 mg/dL (ref 0.6–1.1)
EGFR: 82 mL/min/{1.73_m2} — ABNORMAL LOW (ref >90)
Glucose: 87 mg/dl (ref 70–140)
Potassium: 3.8 mEq/L (ref 3.5–5.1)
Sodium: 141 mEq/L (ref 136–145)
Total Bilirubin: 0.5 mg/dL (ref 0.20–1.20)
Total Protein: 6.9 g/dL (ref 6.4–8.3)

## 2015-02-10 LAB — CBC WITH DIFFERENTIAL/PLATELET
BASO%: 0.7 % (ref 0.0–2.0)
Basophils Absolute: 0.2 10*3/uL — ABNORMAL HIGH (ref 0.0–0.1)
EOS%: 0.7 % (ref 0.0–7.0)
Eosinophils Absolute: 0.2 10*3/uL (ref 0.0–0.5)
HCT: 33.6 % — ABNORMAL LOW (ref 34.8–46.6)
HGB: 11.2 g/dL — ABNORMAL LOW (ref 11.6–15.9)
LYMPH%: 6.9 % — ABNORMAL LOW (ref 14.0–49.7)
MCH: 33 pg (ref 25.1–34.0)
MCHC: 33.2 g/dL (ref 31.5–36.0)
MCV: 99.4 fL (ref 79.5–101.0)
MONO#: 0.4 10*3/uL (ref 0.1–0.9)
MONO%: 1.8 % (ref 0.0–14.0)
NEUT#: 22.4 10*3/uL — ABNORMAL HIGH (ref 1.5–6.5)
NEUT%: 89.9 % — ABNORMAL HIGH (ref 38.4–76.8)
Platelets: 197 10*3/uL (ref 145–400)
RBC: 3.38 10*6/uL — ABNORMAL LOW (ref 3.70–5.45)
RDW: 15.3 % — ABNORMAL HIGH (ref 11.2–14.5)
WBC: 24.9 10*3/uL — ABNORMAL HIGH (ref 3.9–10.3)
lymph#: 1.7 10*3/uL (ref 0.9–3.3)

## 2015-02-10 NOTE — Telephone Encounter (Signed)
Told Diane Lozano that her counts wre fine for her treatment tomorrow as well and her chemistries.  Diane Lozano verbalized understanding.

## 2015-02-11 ENCOUNTER — Ambulatory Visit (HOSPITAL_BASED_OUTPATIENT_CLINIC_OR_DEPARTMENT_OTHER): Payer: 59

## 2015-02-11 VITALS — BP 101/73 | HR 82 | Temp 98.8°F | Resp 20

## 2015-02-11 DIAGNOSIS — C541 Malignant neoplasm of endometrium: Secondary | ICD-10-CM

## 2015-02-11 DIAGNOSIS — Z5111 Encounter for antineoplastic chemotherapy: Secondary | ICD-10-CM | POA: Diagnosis not present

## 2015-02-11 MED ORDER — FAMOTIDINE IN NACL 20-0.9 MG/50ML-% IV SOLN
20.0000 mg | Freq: Once | INTRAVENOUS | Status: AC
  Administered 2015-02-11: 20 mg via INTRAVENOUS

## 2015-02-11 MED ORDER — SODIUM CHLORIDE 0.9 % IV SOLN
Freq: Once | INTRAVENOUS | Status: AC
  Administered 2015-02-11: 13:00:00 via INTRAVENOUS

## 2015-02-11 MED ORDER — DIPHENHYDRAMINE HCL 50 MG/ML IJ SOLN
25.0000 mg | Freq: Once | INTRAMUSCULAR | Status: AC
Start: 2015-02-11 — End: 2015-02-11
  Administered 2015-02-11: 25 mg via INTRAVENOUS

## 2015-02-11 MED ORDER — CARBOPLATIN CHEMO INJECTION 450 MG/45ML
298.5000 mg | Freq: Once | INTRAVENOUS | Status: AC
  Administered 2015-02-11: 300 mg via INTRAVENOUS
  Filled 2015-02-11: qty 30

## 2015-02-11 MED ORDER — CARBOPLATIN CHEMO INTRADERMAL TEST DOSE 100MCG/0.02ML
100.0000 ug | Freq: Once | INTRADERMAL | Status: AC
  Administered 2015-02-11: 100 ug via INTRADERMAL
  Filled 2015-02-11: qty 0.01

## 2015-02-11 MED ORDER — DEXAMETHASONE SODIUM PHOSPHATE 100 MG/10ML IJ SOLN
Freq: Once | INTRAMUSCULAR | Status: AC
  Administered 2015-02-11: 14:00:00 via INTRAVENOUS
  Filled 2015-02-11: qty 4

## 2015-02-11 MED ORDER — PACLITAXEL CHEMO INJECTION 300 MG/50ML
80.0000 mg/m2 | Freq: Once | INTRAVENOUS | Status: AC
  Administered 2015-02-11: 138 mg via INTRAVENOUS
  Filled 2015-02-11: qty 23

## 2015-02-11 MED ORDER — DIPHENHYDRAMINE HCL 50 MG/ML IJ SOLN
INTRAMUSCULAR | Status: AC
  Filled 2015-02-11: qty 1

## 2015-02-11 NOTE — Patient Instructions (Signed)
[facility] Discharge Instructions for Patients Receiving Chemotherapy  Today you received the following chemotherapy agents:  Taxol  To help prevent nausea and vomiting after your treatment, we encourage you to take your nausea medication as prescribed.   If you develop nausea and vomiting that is not controlled by your nausea medication, call the clinic.   BELOW ARE SYMPTOMS THAT SHOULD BE REPORTED IMMEDIATELY:  *FEVER GREATER THAN 100.5 F  *CHILLS WITH OR WITHOUT FEVER  NAUSEA AND VOMITING THAT IS NOT CONTROLLED WITH YOUR NAUSEA MEDICATION  *UNUSUAL SHORTNESS OF BREATH  *UNUSUAL BRUISING OR BLEEDING  TENDERNESS IN MOUTH AND THROAT WITH OR WITHOUT PRESENCE OF ULCERS  *URINARY PROBLEMS  *BOWEL PROBLEMS  UNUSUAL RASH Items with * indicate a potential emergency and should be followed up as soon as possible.  Feel free to call the clinic you have any questions or concerns. The clinic phone number is [phone].  Please show the CHEMO ALERT CARD at check-in to the Emergency Department and triage nurse.   

## 2015-02-12 ENCOUNTER — Ambulatory Visit (HOSPITAL_BASED_OUTPATIENT_CLINIC_OR_DEPARTMENT_OTHER): Payer: 59

## 2015-02-12 ENCOUNTER — Ambulatory Visit: Payer: 59

## 2015-02-12 VITALS — BP 105/59 | HR 75 | Temp 98.4°F

## 2015-02-12 DIAGNOSIS — C541 Malignant neoplasm of endometrium: Secondary | ICD-10-CM

## 2015-02-12 DIAGNOSIS — Z5189 Encounter for other specified aftercare: Secondary | ICD-10-CM | POA: Diagnosis not present

## 2015-02-12 MED ORDER — TBO-FILGRASTIM 480 MCG/0.8ML ~~LOC~~ SOSY
480.0000 ug | PREFILLED_SYRINGE | Freq: Once | SUBCUTANEOUS | Status: AC
  Administered 2015-02-12: 480 ug via SUBCUTANEOUS
  Filled 2015-02-12: qty 0.8

## 2015-02-13 ENCOUNTER — Ambulatory Visit (HOSPITAL_BASED_OUTPATIENT_CLINIC_OR_DEPARTMENT_OTHER): Payer: 59

## 2015-02-13 VITALS — BP 112/60 | HR 78 | Temp 98.4°F | Resp 18

## 2015-02-13 DIAGNOSIS — C541 Malignant neoplasm of endometrium: Secondary | ICD-10-CM

## 2015-02-13 DIAGNOSIS — Z5189 Encounter for other specified aftercare: Secondary | ICD-10-CM

## 2015-02-13 MED ORDER — TBO-FILGRASTIM 480 MCG/0.8ML ~~LOC~~ SOSY
480.0000 ug | PREFILLED_SYRINGE | Freq: Once | SUBCUTANEOUS | Status: AC
  Administered 2015-02-13: 480 ug via SUBCUTANEOUS

## 2015-02-15 ENCOUNTER — Ambulatory Visit (HOSPITAL_BASED_OUTPATIENT_CLINIC_OR_DEPARTMENT_OTHER): Payer: 59

## 2015-02-15 VITALS — BP 119/75 | HR 74 | Temp 98.2°F

## 2015-02-15 DIAGNOSIS — C541 Malignant neoplasm of endometrium: Secondary | ICD-10-CM | POA: Diagnosis not present

## 2015-02-15 DIAGNOSIS — D701 Agranulocytosis secondary to cancer chemotherapy: Secondary | ICD-10-CM

## 2015-02-15 MED ORDER — TBO-FILGRASTIM 480 MCG/0.8ML ~~LOC~~ SOSY
480.0000 ug | PREFILLED_SYRINGE | Freq: Once | SUBCUTANEOUS | Status: AC
  Administered 2015-02-15: 480 ug via SUBCUTANEOUS
  Filled 2015-02-15: qty 0.8

## 2015-02-17 ENCOUNTER — Ambulatory Visit: Payer: 59 | Admitting: Radiation Oncology

## 2015-02-17 ENCOUNTER — Other Ambulatory Visit: Payer: Self-pay | Admitting: Oncology

## 2015-02-17 ENCOUNTER — Other Ambulatory Visit (HOSPITAL_BASED_OUTPATIENT_CLINIC_OR_DEPARTMENT_OTHER): Payer: 59

## 2015-02-17 DIAGNOSIS — C541 Malignant neoplasm of endometrium: Secondary | ICD-10-CM

## 2015-02-17 LAB — COMPREHENSIVE METABOLIC PANEL (CC13)
ALT: 23 U/L (ref 0–55)
AST: 20 U/L (ref 5–34)
Albumin: 4 g/dL (ref 3.5–5.0)
Alkaline Phosphatase: 116 U/L (ref 40–150)
Anion Gap: 8 mEq/L (ref 3–11)
BUN: 14 mg/dL (ref 7.0–26.0)
CO2: 28 mEq/L (ref 22–29)
Calcium: 9.6 mg/dL (ref 8.4–10.4)
Chloride: 105 mEq/L (ref 98–109)
Creatinine: 0.7 mg/dL (ref 0.6–1.1)
EGFR: 90 mL/min/{1.73_m2} — ABNORMAL LOW (ref >90)
Glucose: 104 mg/dl (ref 70–140)
Potassium: 3.8 mEq/L (ref 3.5–5.1)
Sodium: 141 mEq/L (ref 136–145)
Total Bilirubin: 0.49 mg/dL (ref 0.20–1.20)
Total Protein: 6.7 g/dL (ref 6.4–8.3)

## 2015-02-17 LAB — CBC WITH DIFFERENTIAL/PLATELET
BASO%: 1.4 % (ref 0.0–2.0)
Basophils Absolute: 0.1 10*3/uL (ref 0.0–0.1)
EOS%: 2.8 % (ref 0.0–7.0)
Eosinophils Absolute: 0.1 10*3/uL (ref 0.0–0.5)
HCT: 32.5 % — ABNORMAL LOW (ref 34.8–46.6)
HGB: 10.8 g/dL — ABNORMAL LOW (ref 11.6–15.9)
LYMPH%: 22.6 % (ref 14.0–49.7)
MCH: 33.3 pg (ref 25.1–34.0)
MCHC: 33.3 g/dL (ref 31.5–36.0)
MCV: 100 fL (ref 79.5–101.0)
MONO#: 0.2 10*3/uL (ref 0.1–0.9)
MONO%: 3.6 % (ref 0.0–14.0)
NEUT#: 3.3 10*3/uL (ref 1.5–6.5)
NEUT%: 69.6 % (ref 38.4–76.8)
Platelets: 156 10*3/uL (ref 145–400)
RBC: 3.25 10*6/uL — ABNORMAL LOW (ref 3.70–5.45)
RDW: 15.6 % — ABNORMAL HIGH (ref 11.2–14.5)
WBC: 4.7 10*3/uL (ref 3.9–10.3)
lymph#: 1.1 10*3/uL (ref 0.9–3.3)

## 2015-02-17 NOTE — Progress Notes (Addendum)
GYN Location of Tumor / Histology: recurrent endometrial carcinoma  Diane Lozano presented per Dr. Marko Plume, when "she saw Dr Polly Cobia with unremarkable exam in March 2016, however CA 125 was slightly elevated at 36.3, up from previous 29.2. She had CT CAP with contrast 10-16-14 with new left periaortic and left common iliac lymphadenopathy. PET 12-18-78 had hypermetabolic uptake in retroperitoneal nodes from lower poles of kidneys to aortic bifurcation,largest 2.0 x 2.1 cm, as well as uptake in either right supraclavicular node vs right lobe of thyroid (report below). She had FNA of thyroid 10-28-14 with benign tissue, [place] at time of biopsy reportedly showing no right supraclavicular adenopathy. She had CT needle biopsy of left para-aortic node 11-16-14 with path showing serous carcinoma."  Biopsies revealed:   11/16/14  DIAGNOSIS     LYMPH NODE, LEFT PARA-AORTIC, BIOPSY:   METASTATIC CARCINOMA, CONSISTENT WITH METASTATIC HIGH GRADE SEROUS   CARCINOMA.   (APH)     COMMENT   ONLY VERY SMALL AGGREGATES OF MALIGNANT CELLS ARE PRESENT, BUT   IMMUNOHISTOCHEMICALLY, THEIR STAINING PATTERN IS SIMILAR TO THAT NOTED   PREVIOUSLY ([id number]):THEY ARE POSITIVE FOR CYTOKERATIN AE1/AE3,   POSITIVE FOR P53, AND POSITIVE FOR WT1.THE OVERALL MORPHOLOGY AND   IMMUNOHISTOCHEMICAL PATTERN ARE CONSISTENT WITH METASTATIC HIGH GRADE   SEROSA ADENOCARCINOMA.    Past/Anticipated interventions by Gyn/Onc surgery, if any: robotic hysterectomy with BSO and bilateral pelvic and periaortic node dissection in by Dr. Polly Cobia on 07/30/12.  Past/Anticipated interventions by medical oncology, if any: Dr Polly Cobia has recommended chemotherapy with weekly carbo taxol x 3 cycles, then radiation, then additional chemotherapy. She is s/p 2 cycles dose dense carbo taxol with treatment thru 02-18-15.   Weight changes, if any: no  Bowel/Bladder complaints, if any: .no  Nausea/Vomiting, if any: no  Pain  issues, if any: no  SAFETY ISSUES:  Prior radiation? 5/27, 6/5, 6/11, 6/19, 11/27/2012 - proximal vagina 30 Gy in 5 fx, HDR  Pacemaker/ICD? no  Possible current pregnancy? no  Is the patient on methotrexate? no  Current Complaints / other details: Patient's vitals were taken in Dr. Mariana Kaufman office today.  bp was 112/61, hr 79, temp 98.5

## 2015-02-18 ENCOUNTER — Encounter: Payer: Self-pay | Admitting: Oncology

## 2015-02-18 ENCOUNTER — Ambulatory Visit (HOSPITAL_BASED_OUTPATIENT_CLINIC_OR_DEPARTMENT_OTHER): Payer: 59 | Admitting: Oncology

## 2015-02-18 ENCOUNTER — Encounter: Payer: Self-pay | Admitting: Radiation Oncology

## 2015-02-18 ENCOUNTER — Other Ambulatory Visit: Payer: 59

## 2015-02-18 ENCOUNTER — Ambulatory Visit: Payer: 59

## 2015-02-18 ENCOUNTER — Ambulatory Visit
Admission: RE | Admit: 2015-02-18 | Discharge: 2015-02-18 | Disposition: A | Discharge status: Home / Self Care | Payer: 59 | Source: Ambulatory Visit | Attending: Radiation Oncology | Admitting: Radiation Oncology

## 2015-02-18 VITALS — BP 112/61 | HR 79 | Temp 98.5°F | Resp 18 | Ht 63.0 in | Wt 145.8 lb

## 2015-02-18 VITALS — Ht 63.0 in | Wt 146.2 lb

## 2015-02-18 DIAGNOSIS — C772 Secondary and unspecified malignant neoplasm of intra-abdominal lymph nodes: Secondary | ICD-10-CM

## 2015-02-18 DIAGNOSIS — Z510 Encounter for antineoplastic radiation therapy: Secondary | ICD-10-CM | POA: Diagnosis not present

## 2015-02-18 DIAGNOSIS — C541 Malignant neoplasm of endometrium: Secondary | ICD-10-CM | POA: Diagnosis not present

## 2015-02-18 DIAGNOSIS — T451X5A Adverse effect of antineoplastic and immunosuppressive drugs, initial encounter: Secondary | ICD-10-CM

## 2015-02-18 DIAGNOSIS — C7989 Secondary malignant neoplasm of other specified sites: Secondary | ICD-10-CM

## 2015-02-18 DIAGNOSIS — D701 Agranulocytosis secondary to cancer chemotherapy: Secondary | ICD-10-CM | POA: Diagnosis not present

## 2015-02-18 LAB — CA 125: CA 125: 13 U/mL (ref <35)

## 2015-02-18 NOTE — Progress Notes (Signed)
OFFICE PROGRESS NOTE   February 18, 2015   Physicians: Genia Del, MD, Gery Pray MD, [name] MD  INTERVAL HISTORY:  Patient is seen, alone for visit, in continuing attention to treatment in process for recurrent endometrial carcinoma. Counts are now maintaining with increased gCSF with the dose dense carbo taxol, and CA 125 is now 21, having been 132 in June. She sees Dr Sondra Come later today.  Patient had carbo taxol on 02-11-15, including carbo skin test and EMEND with antiemetics, then granix 480 mcg x 3 doses afterward. She had nausea without vomiting and no taste x several days, aching which seems from gCSF, slept most of days 3-4. She has no peripheral neuropathy. Bowels are moving well. She had no fever or symptoms of infection and no bleeding. She denies abdominal or pelvic pain. She has felt well this week.    No central catheter.  No genetics testing CA 125 132 on 11-27-14 (day 1 cycle 1)  ONCOLOGIC HISTORY Patient presented to PCP with intermittent vaginal bleeding since ~ Oct 2013, endometrial biopsy 06-20-12 with complex endometrial hyperplasia with atypia. Pelvic and transvaginal US in [facility] system on 06-26-12 showed endometrial thickening of 2.7 cm, right ovary not clearly seen and left ovary with normal postmenopausal appearance. She was seen by Dr Christophe Louis and referred to Dr Polly Cobia. She had robotic hysterectomy with BSO and bilateral pelvic and periaortic node dissection by Dr Polly Cobia at [facility] on 07-30-2012. Intraoperatively she had large endometrial polyp with high grade malignancy, no obvious extrauterine disease. Pathology ([facility] [place] [id number] from 07-30-2012) had high grade, poorly differentiated endometrial adenocarcinoma with solid and serous papillary components, 2 periaortic nodes negative and 9 right pelvic/11 left pelvic nodes negative. The tumor was 4.8 cm confined to polyp with no definite myometrial  involvement, margins clear and no angiolymphatic invasion identified.  Cycle 1 taxol carboplatin was given 08-30-12, 6 cycles of q 3 week regimen given thru 12-13-2012, with gCSF support. Radiation was intracavitary HDR to proximal vagina 30 Gy in 5 fractions from May 27 thru November 27, 2012, by Dr Sondra Come. Patient was followed closely by Dr Polly Cobia after completing adjuvant taxol carboplatin chemotherapy in 12-2012. Patient felt very well, with intentional 70 lb weight loss over a year from Jan thru Dec 2015 on [employer]. She saw Dr Polly Cobia with unremarkable exam in March 2016, however CA 125 was slightly elevated at 36.3, up from previous 29.2. She had CT CAP with contrast 10-16-14 with new left periaortic and left common iliac lymphadenopathy. PET 02-18-93 had hypermetabolic uptake in retroperitoneal nodes from lower poles of kidneys to aortic bifurcation,largest 2.0 x 2.1 cm, as well as uptake in either right supraclavicular node vs right lobe of thyroid (report below). FNA of thyroid 10-28-14 with benign tissue, [place] at time of biopsy reportedly showing no right supraclavicular adenopathy. She had CT needle biopsy of left para-aortic node 11-16-14 with path showing serous carcinoma. She began chemotherapy with dose dense carbo taxol on 11-27-14, with carbo at AUC = 5. Over next 3 weeks WBC and platelets were too low for treatments, despite total 7 doses of granix. She had taxol only cycle 2, then [name] at AUC=2 + taxol cycle 3.  Counts maintained with increase in gCSF to 480 mcg 3-4 doses after each treatment, including increase in  [name] for third dose to AUC =3 on 02-11-15.     Review of systems as above, also: No SOB. No other pain. No LE swelling.  Bladder fine. Hair is thinning but no complete alopecia Remainder of 10 point Review of Systems negative.  Objective:  Vital signs in last 24 hours:  BP 112/61 mmHg  Pulse 79  Temp(Src) 98.5 F (36.9 C) (Oral)  Resp 18  Ht 5' 3"  (1.6 m)  Wt 145 lb  12.8 oz (66.134 kg)  BMI 25.83 kg/m2 Weight up 2 lbs. Looks comfortable and energetic. Alert, oriented and appropriate. Ambulatory without difficulty.  Partial lopecia  HEENT:PERRL, sclerae not icteric. Oral mucosa moist without lesions, posterior pharynx clear.  Neck supple. No JVD.  Lymphatics:no cervical,supraclavicular,  or inguinal adenopathy Resp: clear to auscultation bilaterally and normal percussion bilaterally Cardio: regular rate and rhythm. No gallop. GI: soft, nontender, not distended, no mass or organomegaly. Normally active bowel sounds. Surgical incisions not remarkable. Musculoskeletal/ Extremities: without pitting edema, cords, tenderness. Some kyphosis, back not tender Neuro: no peripheral neuropathy. Otherwise nonfocal Skin without rash, ecchymosis, petechiae   Lab Results:  Results for orders placed or performed in visit on 02/17/15  CA 125  Result Value Ref Range   CA 125 13 <35 U/mL  CBC with Differential  Result Value Ref Range   WBC 4.7 3.9 - 10.3 10e3/uL   NEUT# 3.3 1.5 - 6.5 10e3/uL   HGB 10.8 (L) 11.6 - 15.9 g/dL   HCT 32.5 (L) 34.8 - 46.6 %   Platelets 156 145 - 400 10e3/uL   MCV 100.0 79.5 - 101.0 fL   MCH 33.3 25.1 - 34.0 pg   MCHC 33.3 31.5 - 36.0 g/dL   RBC 3.25 (L) 3.70 - 5.45 10e6/uL   RDW 15.6 (H) 11.2 - 14.5 %   lymph# 1.1 0.9 - 3.3 10e3/uL   MONO# 0.2 0.1 - 0.9 10e3/uL   Eosinophils Absolute 0.1 0.0 - 0.5 10e3/uL   Basophils Absolute 0.1 0.0 - 0.1 10e3/uL   NEUT% 69.6 38.4 - 76.8 %   LYMPH% 22.6 14.0 - 49.7 %   MONO% 3.6 0.0 - 14.0 %   EOS% 2.8 0.0 - 7.0 %   BASO% 1.4 0.0 - 2.0 %  Comprehensive metabolic panel (Cmet) - [facility]  Result Value Ref Range   Sodium 141 136 - 145 mEq/L   Potassium 3.8 3.5 - 5.1 mEq/L   Chloride 105 98 - 109 mEq/L   CO2 28 22 - 29 mEq/L   Glucose 104 70 - 140 mg/dl   BUN 14.0 7.0 - 26.0 mg/dL   Creatinine 0.7 0.6 - 1.1 mg/dL   Total Bilirubin 0.49 0.20 - 1.20 mg/dL   Alkaline Phosphatase 116 40 - 150  U/L   AST 20 5 - 34 U/L   ALT 23 0 - 55 U/L   Total Protein 6.7 6.4 - 8.3 g/dL   Albumin 4.0 3.5 - 5.0 g/dL   Calcium 9.6 8.4 - 10.4 mg/dL   Anion Gap 8 3 - 11 mEq/L   EGFR 90 (L) >90 ml/min/1.73 m2     Studies/Results:  No results found.  Medications: I have reviewed the patient's current medications.  DISCUSSION: CA 125 down to 13 with chemo given thus far, which has been complicated by chemo neutropenia and lesser chemo thrombocytopenia. If imaging confirms very good response with chemo alone, I wonder if Drs Polly Cobia and Sondra Come would be in favor of additional chemo now. I have ordered PET, as this may take 1-2 weeks to preauthorize; at diagnosis of the recurrence, there were no enlarged nodes by CT, but areas PET positive. I will be in  touch with her other physicians to discuss. It is possible that pelvic or extended field RT could exacerbate cytopenias for further chemo.  Patient has discussed location of imaging studies,  last CT AP 10-12-14 and PET 10-20-14  done in [facility] system by Dr Polly Cobia. As patient is employed by [employer], her copay is $250 vs $500 + 20% vs 40% if imaging is done in [facility] system. All information can be given to Dr Polly Cobia also.  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 She is now tolerating chemo with increased gCSF and lower carbo dosing. Consideration of RT now as originally recommended, vs additional chemo first. PET requested. I will be in touch with Drs Polly Cobia and Sondra Come to discuss.  2.chemo neutropenia:  granix 480 mcg x 3 doses after each treatment. Note ANC maintained even with EMEND day of carbo 3.intentional weight loss of 70 lbs over the 2015 calendar year, trying to maintain now. Hypoglycemia intermittently by labs, not clearly symptomatic 4.chronic constipation, which  was a concern during last chemo. Up to date on colonoscopy.  5.BP good without antihypertensives now, follow. Likely improved with weight loss  6.elevated lipids 7.remote minimal tobacco, not enough to qualify for the lung cancer screening program 8.up to date mammograms 9.melanoma in brother 76.  some diffuse uptake right thyroid on PET, no specific nodule on [place] and FNA benign. TFTs WNL 6-24-160. 11. She will have flu vaccine in next couple of weeks since on chemo break unless change in plan   Questions answered and patient understands discussion and considerations. Time spent 30 min including >50% counseling and coordination of care. Cc Drs Polly Cobia, Verner Chol, MD   02/18/2015, 1:33 PM

## 2015-02-18 NOTE — Progress Notes (Signed)
Radiation Oncology         ([phone]) [phone] ________________________________  Name: Diane Lozano MRN: [id number]  Date: 02/18/2015  DOB: 05-25-1956  Re-evaluation Note  CC: [name], MD  [name], MD    ICD-9-CM ICD-10-CM   1. Endometrial cancer 182.0 C54.1     Diagnosis: Recurrent Endometrial cancer (high-grade serous adenocarcinoma) with metastasis to the retroperitoneal lymph nodes  Interval Since Last Radiation:  2 years (May 27, June 5,  June 11, June 19, November 27, 2012)  Site/dose:   Proximal vagina 30 Gy in 5 fractions. She completed intracavitary brachytherapy treatments.  Narrative: The patient has completed 3 cycles dose dense carbo taxol with treatment completed today and she is now ready to proceed with the radiation portion of treatment. Her CA 125 is lower since receiving chemotherapy treatment.  CEA 125 yesterday showed a value of 13 down from 40 approximately 4 weeks ago and down from 132 2 months ago . She saw Dr. Marko Plume earlier today. The patient denies nausea, vomiting, bowel, or bladder complaints.  ALLERGIES:  has No Known Allergies.  Meds: Current Outpatient Prescriptions  Medication Sig Dispense Refill  . Calcium Carb-Cholecalciferol 500-600 MG-UNIT TABS Take 1 tablet by mouth daily.    Marland Kitchen LORazepam (ATIVAN) 1 MG tablet Place 1/2 -1 tablet under the tongue or swallow every 6 hrs as needed for nausea.  Will make drowsy. 30 tablet 1  . ondansetron (ZOFRAN) 8 MG tablet Take 1 tablet (8 mg total) by mouth every 8 (eight) hours as needed for nausea or vomiting. 30 tablet 2  . simvastatin (ZOCOR) 40 MG tablet Take 40 mg by mouth every evening.    . venlafaxine XR (EFFEXOR-XR) 75 MG 24 hr capsule Take 75 mg by mouth daily.    Marland Kitchen dexamethasone (DECADRON) 4 MG tablet Take 5 tablets with food 12 hours prior to Taxol Chemotherapy. (Patient not taking: Reported on 02/18/2015) 15 tablet 2  . prochlorperazine (COMPAZINE) 10 MG tablet Take 1 tablet (10 mg total)  by mouth every 6 (six) hours as needed for nausea or vomiting. (Patient not taking: Reported on 02/18/2015) 30 tablet 1  . senna-docusate (SENOKOT S) 8.6-50 MG per tablet Take 1 tablet by mouth daily as needed for mild constipation.     No current facility-administered medications for this encounter.    Physical Findings: The patient is in no acute distress. Patient is alert and oriented.  height is 5\' 3"  (1.6 m) and weight is 146 lb 3.2 oz (66.316 kg).   No significant changes. This is a pleasant woman who appears her stated age. Lungs are clear to auscultation bilaterally. Heart has regular rate and rhythm. No palpable cervical, supraclavicular, or axillary adenopathy.   Lab Findings: Lab Results  Component Value Date   WBC 4.7 02/17/2015   HGB 10.8* 02/17/2015   HCT 32.5* 02/17/2015   MCV 100.0 02/17/2015   PLT 156 02/17/2015    Radiographic Findings: No results found.  Impression: The patient tolerated chemotherapy well.  Plan: Dr. Marko Plume has ordered a PET scan. The patient will be seen soon after her PET scan for further evaluation and likely planning of her radiation therapy. Expect to start  radiation treatments in mid-October since the patient will be on vacation the second week of October.  This document serves as a record of services personally performed by Gery Pray, MD. It was created on his behalf by Darcus Austin, a trained medical scribe. The creation of this record is based  on the scribe's personal observations and the provider's statements to them. This document has been checked and approved by the attending provider.  ____________________________________  Blair Promise, PhD, MD

## 2015-02-19 ENCOUNTER — Ambulatory Visit: Payer: 59

## 2015-02-20 ENCOUNTER — Ambulatory Visit: Payer: 59

## 2015-02-22 ENCOUNTER — Ambulatory Visit: Payer: 59

## 2015-02-22 ENCOUNTER — Telehealth: Payer: Self-pay | Admitting: *Deleted

## 2015-02-22 ENCOUNTER — Telehealth: Payer: Self-pay | Admitting: Oncology

## 2015-02-22 ENCOUNTER — Other Ambulatory Visit: Payer: Self-pay | Admitting: Oncology

## 2015-02-22 NOTE — Telephone Encounter (Signed)
[facility] [phone] and LM with Dr Shelba Flake RN asking to speak with her about patient  L.Marko Plume MD

## 2015-02-22 NOTE — Telephone Encounter (Signed)
Called patient with Dr. Mariana Kaufman instructions.

## 2015-02-22 NOTE — Telephone Encounter (Signed)
"   I received my last Taxol/carbo chemotherapy 02-11-2015 and need to know am I clear yet to receive Flu vaccine.  I am on break for radiation.  Radiation should begin third week in October."  Return number [phone].

## 2015-02-22 NOTE — Telephone Encounter (Signed)
Re flu vaccine  I would like her to have flu vaccine today or tomorrow, here or elsewhere - RN please put in order if here Tell her that I have LM for Dr Polly Cobia to call me to discuss.

## 2015-02-22 NOTE — Telephone Encounter (Signed)
Medical Oncology  Dr Polly Cobia returned my call, discussed situation including difficulty with counts, good drop in CA 125, consideration of more chemo before RT or other sequencing. She agrees with PET now, mentioned if very good response might even consider no additional chemo after RT. She is in agreement with PET being done in [facility] system for best insurance coverage, and I will let her know results. Recommendation for sandwich chemo RT is from retrospective data and may not be applicable for recurrent disease.  Godfrey Pick, MD

## 2015-02-26 ENCOUNTER — Other Ambulatory Visit: Payer: Self-pay | Admitting: Oncology

## 2015-02-26 ENCOUNTER — Telehealth: Payer: Self-pay | Admitting: Oncology

## 2015-02-26 DIAGNOSIS — C541 Malignant neoplasm of endometrium: Secondary | ICD-10-CM

## 2015-02-26 NOTE — Telephone Encounter (Signed)
Medical Oncology  LM for patient on cell to call back to RN today or MD on 9-26 re my discussion with Dr Polly Cobia.  If she speaks with RN, please let her know:  Dr Polly Cobia and I spoke by phone. She is in agreement with PET and fine with that being done in [facility] system; I will get the information to her. Depending on results of PET, we discussed options for timing of RT/ additional chemo, but did not make any decisions yet.  I am not completely comfortable with these several weeks off of chemo until RT scheduled, tho I know patient is to be out of town sometime in there. Would she consider letting us do another single agent taxol in next week or so to continue some coverage?  L.Marko Plume, MD

## 2015-02-26 NOTE — Telephone Encounter (Signed)
Spoke with Diane Lozano regarding Dr. Mariana Kaufman discussion with Dr. Polly Cobia and treatment with Taxol as noted below by Dr. Marko Plume. Diane Lozano is willing to have another course of Taxol as noted below.  She would prefer treatment on 03-04-15 if possible.  Diane Lozano will be out of town 10-8 through 03-20-15. Dr. Marko Plume notiried of patient's treatment decision and will sent a POF to schedulers to schedule chemotherapy.

## 2015-03-01 ENCOUNTER — Ambulatory Visit (HOSPITAL_COMMUNITY)
Admission: RE | Admit: 2015-03-01 | Discharge: 2015-03-01 | Disposition: A | Discharge status: Home / Self Care | Payer: 59 | Source: Ambulatory Visit | Attending: Oncology | Admitting: Oncology

## 2015-03-01 ENCOUNTER — Telehealth: Payer: Self-pay | Admitting: Oncology

## 2015-03-01 ENCOUNTER — Telehealth: Payer: Self-pay | Admitting: *Deleted

## 2015-03-01 DIAGNOSIS — E041 Nontoxic single thyroid nodule: Secondary | ICD-10-CM | POA: Insufficient documentation

## 2015-03-01 DIAGNOSIS — C541 Malignant neoplasm of endometrium: Secondary | ICD-10-CM | POA: Insufficient documentation

## 2015-03-01 DIAGNOSIS — C7989 Secondary malignant neoplasm of other specified sites: Secondary | ICD-10-CM

## 2015-03-01 DIAGNOSIS — Z510 Encounter for antineoplastic radiation therapy: Secondary | ICD-10-CM | POA: Diagnosis not present

## 2015-03-01 DIAGNOSIS — E279 Disorder of adrenal gland, unspecified: Secondary | ICD-10-CM | POA: Insufficient documentation

## 2015-03-01 LAB — GLUCOSE, CAPILLARY: Glucose-Capillary: 87 mg/dL (ref 65–99)

## 2015-03-01 MED ORDER — FLUDEOXYGLUCOSE F - 18 (FDG) INJECTION: 7.3000 | Freq: Once | INTRAVENOUS | Status: DC | PRN

## 2015-03-01 NOTE — Telephone Encounter (Signed)
Per staff message and POF I have scheduled appts. Advised scheduler of appts and change per Manager JMW

## 2015-03-01 NOTE — Telephone Encounter (Signed)
9/23 pof noted for 9/29 chemo which i have sent  A email to maggie to advise

## 2015-03-01 NOTE — Telephone Encounter (Signed)
Called and left a message with 9/30 appointment

## 2015-03-02 ENCOUNTER — Other Ambulatory Visit: Payer: Self-pay | Admitting: Oncology

## 2015-03-03 ENCOUNTER — Telehealth: Payer: Self-pay | Admitting: *Deleted

## 2015-03-03 NOTE — Telephone Encounter (Signed)
-----   Message from Patton Salles, RN sent at 03/03/2015  9:50 AM EDT -----   ----- Message -----    From: [name], MD    Sent: 03/02/2015   3:44 PM      To: Patton Salles, RN  Please tell her that PET shows one retroperitoneal node with uptake  (a 1 cm node, so not enlarged by CT criteria) and uptake in the area that was biopsied in thyroid (there is a typo in body of the report as adrenal nodule, but conclusion is correct as thyroid). No other uptake on the PET I will let Drs Polly Cobia and Sondra Come know I'll try to get the PET report to show in MyChart for her  thanks ----- Message -----    From: Patton Salles, RN    Sent: 03/02/2015   2:23 PM      To: [name], MD  Requesting PET results

## 2015-03-03 NOTE — Telephone Encounter (Signed)
This RN spoke with pt per MD review and reading of PET.  Questions answered regarding reading including her understanding typo of adrenal gland- which is not correct.  Pt verbalized appreciation of call.  Appointments for treatment and neupogen inj reviewd. Pt does not have a follow up with [facility] at this time- but stated- " Ok so now I will wait to hear from Dr Sondra Come about the next step ".  Dr Marko Plume will be informed of the above for appointment verification.

## 2015-03-05 ENCOUNTER — Other Ambulatory Visit (HOSPITAL_BASED_OUTPATIENT_CLINIC_OR_DEPARTMENT_OTHER): Payer: 59

## 2015-03-05 ENCOUNTER — Other Ambulatory Visit: Payer: Self-pay | Admitting: Oncology

## 2015-03-05 ENCOUNTER — Ambulatory Visit (HOSPITAL_BASED_OUTPATIENT_CLINIC_OR_DEPARTMENT_OTHER): Payer: 59

## 2015-03-05 VITALS — BP 116/67 | HR 71 | Temp 98.7°F | Resp 17 | Ht 63.0 in

## 2015-03-05 DIAGNOSIS — C541 Malignant neoplasm of endometrium: Secondary | ICD-10-CM | POA: Diagnosis not present

## 2015-03-05 DIAGNOSIS — D701 Agranulocytosis secondary to cancer chemotherapy: Secondary | ICD-10-CM | POA: Diagnosis not present

## 2015-03-05 LAB — CBC WITH DIFFERENTIAL/PLATELET
BASO%: 0.7 % (ref 0.0–2.0)
Basophils Absolute: 0 10*3/uL (ref 0.0–0.1)
EOS%: 0 % (ref 0.0–7.0)
Eosinophils Absolute: 0 10*3/uL (ref 0.0–0.5)
HCT: 34.9 % (ref 34.8–46.6)
HGB: 11.6 g/dL (ref 11.6–15.9)
LYMPH%: 26.6 % (ref 14.0–49.7)
MCH: 33.1 pg (ref 25.1–34.0)
MCHC: 33.2 g/dL (ref 31.5–36.0)
MCV: 99.7 fL (ref 79.5–101.0)
MONO#: 0 10*3/uL — ABNORMAL LOW (ref 0.1–0.9)
MONO%: 1.4 % (ref 0.0–14.0)
NEUT#: 1 10*3/uL — ABNORMAL LOW (ref 1.5–6.5)
NEUT%: 71.3 % (ref 38.4–76.8)
Platelets: 152 10*3/uL (ref 145–400)
RBC: 3.5 10*6/uL — ABNORMAL LOW (ref 3.70–5.45)
RDW: 13.2 % (ref 11.2–14.5)
WBC: 1.4 10*3/uL — ABNORMAL LOW (ref 3.9–10.3)
lymph#: 0.4 10*3/uL — ABNORMAL LOW (ref 0.9–3.3)

## 2015-03-05 LAB — COMPREHENSIVE METABOLIC PANEL (CC13)
ALT: 22 U/L (ref 0–55)
AST: 19 U/L (ref 5–34)
Albumin: 4.1 g/dL (ref 3.5–5.0)
Alkaline Phosphatase: 56 U/L (ref 40–150)
Anion Gap: 7 mEq/L (ref 3–11)
BUN: 18 mg/dL (ref 7.0–26.0)
CO2: 26 mEq/L (ref 22–29)
Calcium: 9.7 mg/dL (ref 8.4–10.4)
Chloride: 107 mEq/L (ref 98–109)
Creatinine: 0.8 mg/dL (ref 0.6–1.1)
EGFR: 86 mL/min/{1.73_m2} — ABNORMAL LOW (ref >90)
Glucose: 144 mg/dl — ABNORMAL HIGH (ref 70–140)
Potassium: 4.2 mEq/L (ref 3.5–5.1)
Sodium: 140 mEq/L (ref 136–145)
Total Bilirubin: 0.38 mg/dL (ref 0.20–1.20)
Total Protein: 7 g/dL (ref 6.4–8.3)

## 2015-03-05 MED ORDER — TBO-FILGRASTIM 480 MCG/0.8ML ~~LOC~~ SOSY
480.0000 ug | PREFILLED_SYRINGE | Freq: Once | SUBCUTANEOUS | Status: AC
  Administered 2015-03-05: 480 ug via SUBCUTANEOUS
  Filled 2015-03-05: qty 0.8

## 2015-03-05 NOTE — Patient Instructions (Signed)

## 2015-03-05 NOTE — Progress Notes (Signed)
Per Dr. Marko Plume, hold treatment d/t ANC 1.0 and WBC 1.4.  Pt to receive Granix only in infusion room.  Pt not to receive Granix X next 3 days as originally scheduled.  Reiterated neutropenic precautions and pt verbalized understanding.  No further questions about plan of care at time of discharge.

## 2015-03-06 ENCOUNTER — Ambulatory Visit: Payer: 59

## 2015-03-08 ENCOUNTER — Ambulatory Visit: Payer: 59

## 2015-03-09 ENCOUNTER — Other Ambulatory Visit: Payer: Self-pay | Admitting: Oncology

## 2015-03-09 ENCOUNTER — Ambulatory Visit: Payer: 59

## 2015-03-10 ENCOUNTER — Other Ambulatory Visit: Payer: Self-pay | Admitting: Oncology

## 2015-03-10 ENCOUNTER — Telehealth: Payer: Self-pay | Admitting: Oncology

## 2015-03-10 DIAGNOSIS — C7989 Secondary malignant neoplasm of other specified sites: Secondary | ICD-10-CM

## 2015-03-10 DIAGNOSIS — C541 Malignant neoplasm of endometrium: Secondary | ICD-10-CM

## 2015-03-10 NOTE — Telephone Encounter (Signed)
Medical Oncology  LM with Dr Shelba Flake RN to be sure they had received report of PET and asking if Dr Polly Cobia has other recommendations different from original plan for RT now.  Godfrey Pick, MD

## 2015-03-11 ENCOUNTER — Telehealth: Payer: Self-pay | Admitting: Oncology

## 2015-03-11 ENCOUNTER — Ambulatory Visit (HOSPITAL_BASED_OUTPATIENT_CLINIC_OR_DEPARTMENT_OTHER): Payer: 59 | Admitting: Oncology

## 2015-03-11 ENCOUNTER — Ambulatory Visit (HOSPITAL_BASED_OUTPATIENT_CLINIC_OR_DEPARTMENT_OTHER): Payer: 59

## 2015-03-11 ENCOUNTER — Encounter: Payer: Self-pay | Admitting: Oncology

## 2015-03-11 VITALS — BP 127/87 | HR 80 | Temp 98.3°F | Resp 18 | Ht 63.0 in | Wt 144.0 lb

## 2015-03-11 DIAGNOSIS — T451X5A Adverse effect of antineoplastic and immunosuppressive drugs, initial encounter: Secondary | ICD-10-CM

## 2015-03-11 DIAGNOSIS — C7989 Secondary malignant neoplasm of other specified sites: Secondary | ICD-10-CM | POA: Diagnosis not present

## 2015-03-11 DIAGNOSIS — C772 Secondary and unspecified malignant neoplasm of intra-abdominal lymph nodes: Secondary | ICD-10-CM

## 2015-03-11 DIAGNOSIS — C541 Malignant neoplasm of endometrium: Secondary | ICD-10-CM

## 2015-03-11 DIAGNOSIS — D701 Agranulocytosis secondary to cancer chemotherapy: Secondary | ICD-10-CM

## 2015-03-11 DIAGNOSIS — E049 Nontoxic goiter, unspecified: Secondary | ICD-10-CM

## 2015-03-11 LAB — CBC WITH DIFFERENTIAL/PLATELET
BASO%: 2.9 % — ABNORMAL HIGH (ref 0.0–2.0)
Basophils Absolute: 0.1 10*3/uL (ref 0.0–0.1)
EOS%: 7 % (ref 0.0–7.0)
Eosinophils Absolute: 0.3 10*3/uL (ref 0.0–0.5)
HCT: 36.3 % (ref 34.8–46.6)
HGB: 12 g/dL (ref 11.6–15.9)
LYMPH%: 26 % (ref 14.0–49.7)
MCH: 33 pg (ref 25.1–34.0)
MCHC: 33.2 g/dL (ref 31.5–36.0)
MCV: 99.5 fL (ref 79.5–101.0)
MONO#: 0.3 10*3/uL (ref 0.1–0.9)
MONO%: 7.8 % (ref 0.0–14.0)
NEUT#: 2.2 10*3/uL (ref 1.5–6.5)
NEUT%: 56.3 % (ref 38.4–76.8)
Platelets: 154 10*3/uL (ref 145–400)
RBC: 3.65 10*6/uL — ABNORMAL LOW (ref 3.70–5.45)
RDW: 13.6 % (ref 11.2–14.5)
WBC: 4 10*3/uL (ref 3.9–10.3)
lymph#: 1 10*3/uL (ref 0.9–3.3)

## 2015-03-11 NOTE — Telephone Encounter (Signed)
Medical Oncology   Dr Polly Cobia returned my call to discuss PET, which is improved comparing with previous report. She agrees with radiation as planned. Discussed low counts; she suggests trying single agent carboplatin + neulasta after radiation.  She has not received the PET report, which I have tried again now to send to her.  RN to let patient know of this conversation.  L.Alyona Romack,MD

## 2015-03-11 NOTE — Progress Notes (Signed)
OFFICE PROGRESS NOTE   March 11, 2015   Physicians: Genia Del, MD, Gery Pray MD, [name] MD  INTERVAL HISTORY:  Patient is seen, alone for visit, to discuss results of recent PET and treatment plan from here for recurrent endometrial carcinoma. Last chemo was carboplatin AUC = 3 and taxol 80 mg/m2 on 02-11-15, with granix 480 mg x3 afterwards; ANC was only 1.0 on 03-05-15 (on premed steroids) such that we did not give another taxol that day. Plan had been dose dense carbo taxol x 3 cycles, radiation, additional chemo. Counts run low such that chemo has had multiple delays and carbo dose reductions. She saw Dr Sondra Come shortly prior to PET. Patient is out of town week of Oct 10. Radiation oncology is to schedule follow up and simulation.  Last PET was in [place] system 10-20-14. PET 03-01-15 has uptake still in right thyroid (biopsy benign 10-2014) and in a single 1 cm left periaortic node above bifurcation with SUV max 8.1, no other areas of uptake.  Patient has felt well, including when [place] was 1.0 on 03-05-15, other than brief gastroenteritis symptoms x1 over a week ago. She has had no fever or other symptoms of infection, no abdominal or pelvic pain, hair quite thin, no peripheral neuropathy, bowels and bladder fine.    No central catheter.  No genetics testing CA 125 132 on 11-27-14 (day 1 cycle 1)   ONCOLOGIC HISTORY Patient presented to PCP with intermittent vaginal bleeding since ~ Oct 2013, endometrial biopsy 06-20-12 with complex endometrial hyperplasia with atypia. Pelvic and transvaginal US in [place] system on 06-26-12 showed endometrial thickening of 2.7 cm, right ovary not clearly seen and left ovary with normal postmenopausal appearance. She was seen by Dr Christophe Louis and referred to Dr Polly Cobia. She had robotic hysterectomy with BSO and bilateral pelvic and periaortic node dissection by Dr Polly Cobia at [facility] on 07-30-2012. Intraoperatively she had large  endometrial polyp with high grade malignancy, no obvious extrauterine disease. Pathology ([facility] [place] [id number] from 07-30-2012) had high grade, poorly differentiated endometrial adenocarcinoma with solid and serous papillary components, 2 periaortic nodes negative and 9 right pelvic/11 left pelvic nodes negative. The tumor was 4.8 cm confined to polyp with no definite myometrial involvement, margins clear and no angiolymphatic invasion identified.  Cycle 1 taxol carboplatin was given 08-30-12, 6 cycles of q 3 week regimen given thru 12-13-2012, with gCSF support. Radiation was intracavitary HDR to proximal vagina 30 Gy in 5 fractions from May 27 thru November 27, 2012, by Dr Sondra Come. Patient was followed closely by Dr Polly Cobia after completing adjuvant taxol carboplatin chemotherapy in 12-2012. Patient felt very well, with intentional 70 lb weight loss over a year from Jan thru Dec 2015 on [employer]. She saw Dr Polly Cobia with unremarkable exam in March 2016, however CA 125 was slightly elevated at 36.3, up from previous 29.2. She had CT CAP with contrast 10-16-14 with new left periaortic and left common iliac lymphadenopathy. PET 08-18-15 had hypermetabolic uptake in retroperitoneal nodes from lower poles of kidneys to aortic bifurcation,largest 2.0 x 2.1 cm, as well as uptake in either right supraclavicular node vs right lobe of thyroid (report below). FNA of thyroid 10-28-14 with benign tissue, [place] at time of biopsy reportedly showing no right supraclavicular adenopathy. She had CT needle biopsy of left para-aortic node 11-16-14 with path showing serous carcinoma. She began chemotherapy with dose dense carbo taxol on 11-27-14, with carbo at AUC = 5. Over next  3 weeks WBC and platelets were too low for treatments, despite total 7 doses of granix. She had taxol only cycle 2, then [name] at AUC=2 + taxol cycle 3. Counts maintained with increase in gCSF to 480 mcg 3-4 doses after each  treatment, including increase in [name] for third dose to AUC =3 on 02-11-15. PET 03-01-15 had uptake in  one left peri-aortic node and the area of previous benign biopsy right lobe thyroid. Counts were too low for additional taxol on 03-05-15.     Review of systems as above, also: Good energy and appetite. No SOB. No pain. No LE swelling. No bleeding Remainder of 10 point Review of Systems negative.  Objective:  Vital signs in last 24 hours: Weight 144, down 2 lbs. Height 5'3", BMI 25.6, 127/87, 80 regular, 18 not labored RA, 98.3, 99%  Alert, oriented and appropriate. Ambulatory without difficulty. Looks comfortable. Hair thinning  HEENT:PERRL, sclerae not icteric. Oral mucosa moist without lesions, posterior pharynx clear.  Neck supple. No JVD.  Lymphatics:no cervical,supraclavicular, axillary or inguinal adenopathy Resp: clear to auscultation bilaterally and normal percussion bilaterally Cardio: regular rate and rhythm. No gallop. GI: soft, nontender, not distended, no mass or organomegaly. Normally active bowel sounds. Surgical incision not remarkable. Musculoskeletal/ Extremities: without pitting edema, cords, tenderness Neuro: no peripheral neuropathy. Otherwise nonfocal. PSYCH appropriate mood and affect Skin without rash, ecchymosis, petechiae  Lab Results:  Results for orders placed or performed in visit on 03/05/15  CBC with Differential  Result Value Ref Range   WBC 1.4 (L) 3.9 - 10.3 10e3/uL   NEUT# 1.0 (L) 1.5 - 6.5 10e3/uL   HGB 11.6 11.6 - 15.9 g/dL   HCT 34.9 34.8 - 46.6 %   Platelets 152 145 - 400 10e3/uL   MCV 99.7 79.5 - 101.0 fL   MCH 33.1 25.1 - 34.0 pg   MCHC 33.2 31.5 - 36.0 g/dL   RBC 3.50 (L) 3.70 - 5.45 10e6/uL   RDW 13.2 11.2 - 14.5 %   lymph# 0.4 (L) 0.9 - 3.3 10e3/uL   MONO# 0.0 (L) 0.1 - 0.9 10e3/uL   Eosinophils Absolute 0.0 0.0 - 0.5 10e3/uL   Basophils Absolute 0.0 0.0 - 0.1 10e3/uL   NEUT% 71.3 38.4 - 76.8 %   LYMPH% 26.6 14.0 - 49.7 %    MONO% 1.4 0.0 - 14.0 %   EOS% 0.0 0.0 - 7.0 %   BASO% 0.7 0.0 - 2.0 %  Comprehensive metabolic panel (Cmet) - [facility]  Result Value Ref Range   Sodium 140 136 - 145 mEq/L   Potassium 4.2 3.5 - 5.1 mEq/L   Chloride 107 98 - 109 mEq/L   CO2 26 22 - 29 mEq/L   Glucose 144 (H) 70 - 140 mg/dl   BUN 18.0 7.0 - 26.0 mg/dL   Creatinine 0.8 0.6 - 1.1 mg/dL   Total Bilirubin 0.38 0.20 - 1.20 mg/dL   Alkaline Phosphatase 56 40 - 150 U/L   AST 19 5 - 34 U/L   ALT 22 0 - 55 U/L   Total Protein 7.0 6.4 - 8.3 g/dL   Albumin 4.1 3.5 - 5.0 g/dL   Calcium 9.7 8.4 - 10.4 mg/dL   Anion Gap 7 3 - 11 mEq/L   EGFR 86 (L) >90 ml/min/1.73 m2    Labs done after visit today WBC 4.0, ANC 2.2, Hgb 12, plt 154 CA 125 17, this having been 13 on 02-17-15  Studies/Results: EXAM: NUCLEAR MEDICINE PET SKULL BASE TO THIGH  TECHNIQUE: 7.3 mCi F-18 FDG was injected intravenously. Full-ring PET imaging was performed from the skull base to thigh after the radiotracer. CT data was obtained and used for attenuation correction and anatomic localization.  FASTING BLOOD GLUCOSE: Value: 87 mg/dl  COMPARISON: CT 10/12/2014  FINDINGS: NECK  No hypermetabolic lymph nodes in the neck.  CHEST  There is intense uptake noted associated with a 2.6 cm nodule extending posterior lateral from the RIGHT adrenal gland. This presumably was the biopsied lesion in clinician's node.  ABDOMEN/PELVIS  No abnormal hypermetabolic activity within the liver, pancreas, adrenal glands, or spleen.  Single small hypermetabolic LEFT periaortic lymph node measures 10 mm short axis just above the bifurcation with SUV max equal 8.1. No additional hypermetabolic retroperitoneal or pelvic lymph nodes.  Post hysterectomy anatomy. No pelvic sidewall lesions.  SKELETON  No focal hypermetabolic activity to suggest skeletal metastasis.  IMPRESSION: 1. Single hypermetabolic small retroperitoneal lymph node along  the aorta. 2. No evidence of metastatic disease otherwise in the abdomen or pelvis. No evidence local recurrence. 3. Intensely hypermetabolic enlarged nodule adjacent to the RIGHT lobe of thyroid gland. This presumably represents the biopsied lesion in clinician report which was found to be benign thyroid tissue.  PET information reviewed with patient now, including PACs images   Medications: I have reviewed the patient's current medications.  DISCUSSION :  PET as above. Reviewed my conversation with Dr Polly Cobia prior to PET; patient aware that PET information sent to her and that I expect to hear from her if any change to recommendation for radiation now. I have told patient that I am comfortable with radiation plan, as we will obviously need growth factor support with or without radiation if she needs further chemo, and at this point I do not think we can give additional chemo now to eradicate the residual disease present now.  Following visit, Dr Polly Cobia and I did speak by phone. She is in agreement with proceeding with radiation next, would consider single agent carbo with neulasta if additional chemo used following radiation.   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 Even dose reduced carboplatin and weekly taxol has been difficult due to low counts, tho improvement on PET compared with report of scan 10-2014. Appreciate Dr Shelba Flake input and Dr Clabe Seal assistance planned. I will see her back with counts during radiation, appointments to be coordinated with her RT as possible. Note slight increase in CA 125 on short treatment break thus far 2.chemo neutropenia: granix 480 mcg x 3 doses needed after each treatment. Note ANC maintained with EMEND day of carbo 3.intentional weight loss of 70 lbs over the 2015  calendar year, trying to maintain now. Hypoglycemia intermittently by labs, not clearly symptomatic 4.chronic constipation, which was a concern during last chemo. Up to date on colonoscopy.  5.BP good without antihypertensives now, follow. Likely improved with weight loss  6.elevated lipids 7.remote minimal tobacco, not enough to qualify for the lung cancer screening program 8.up to date mammograms 9.melanoma in brother 57. some diffuse uptake right thyroid on PET, no specific nodule on [place] and FNA benign. TFTs WNL 6-24-160. She will discuss with PCP at next visit there. 11.  flu vaccine done   All questions answered and patient is in agreement with plans and recommendations. We will let her know lab results and update on my discussion with Dr Polly Cobia. CC Drs  Ciro Backer. Time spent 30 min including >50% counseling and coordination of care.     Diane Tramell P, MD   03/11/2015, 8:46 AM

## 2015-03-12 ENCOUNTER — Telehealth: Payer: Self-pay

## 2015-03-12 LAB — CA 125: CA 125: 17 U/mL (ref <35)

## 2015-03-12 NOTE — Telephone Encounter (Signed)
-----   Message from [name], MD sent at 03/11/2015  4:32 PM EDT ----- Still waiting on CA 125, but when you speak with her on 10-7 re CA 125, also tell her results of CBC from today (with ANC 2.2) and please let her know that Dr Polly Cobia and I spoke this afternoon - she is pleased that the PET does show some improvement, is in favor of RT as planned, and we are considering options for chemo after RT completes.

## 2015-03-12 NOTE — Telephone Encounter (Signed)
Told Diane Lozano that her CA-125 was 17 yesterday. Reviewed the other lab information and information from Dr. Vaughan Browner as noted below by Dr. Marko Plume.  Pt. Verbalized understanding and appreciative of call.

## 2015-03-13 DIAGNOSIS — E049 Nontoxic goiter, unspecified: Secondary | ICD-10-CM | POA: Insufficient documentation

## 2015-03-24 ENCOUNTER — Encounter: Payer: Self-pay | Admitting: Radiation Oncology

## 2015-03-24 ENCOUNTER — Ambulatory Visit
Admission: RE | Admit: 2015-03-24 | Discharge: 2015-03-24 | Disposition: A | Discharge status: Home / Self Care | Payer: 59 | Source: Ambulatory Visit | Attending: Radiation Oncology | Admitting: Radiation Oncology

## 2015-03-24 VITALS — BP 124/82 | HR 69 | Temp 98.2°F | Resp 18 | Ht 63.0 in | Wt 149.3 lb

## 2015-03-24 DIAGNOSIS — Z510 Encounter for antineoplastic radiation therapy: Secondary | ICD-10-CM | POA: Diagnosis not present

## 2015-03-24 DIAGNOSIS — C772 Secondary and unspecified malignant neoplasm of intra-abdominal lymph nodes: Secondary | ICD-10-CM

## 2015-03-24 NOTE — Progress Notes (Signed)
Diane Lozano is here for follow up recon.  She denies pain.  She last had chemotherapy on 02/11/15.  She denies having any bowel/bladder issues, vaginal/rectal bleeding and nausea.    BP 124/82 mmHg  Pulse 69  Temp(Src) 98.2 F (36.8 C) (Oral)  Resp 18  Ht 5\' 3"  (1.6 m)  Wt 149 lb 4.8 oz (67.722 kg)  BMI 26.45 kg/m2  SpO2 100%

## 2015-03-24 NOTE — Progress Notes (Signed)
Radiation Oncology         [phone] ________________________________  Name: Diane Lozano MRN: [id number]  Date: 03/24/2015  DOB: July 12, 1955  Re-evaluation Note  CC: [name], MD  [name], MD    ICD-9-CM ICD-10-CM   1. Metastatic cancer to intra-abdominal lymph nodes (HCC) 196.2 C77.2     Diagnosis: Recurrent Endometrial cancer (high-grade serous adenocarcinoma) with metastasis to the retroperitoneal lymph nodes  Interval Since Last Radiation:  2 years 4 months (May 27, June 5,  June 11, June 19, November 27, 2012)  Site/dose:   Proximal vagina 30 Gy in 5 fractions. She completed intracavitary brachytherapy treatments.  Narrative: Diane Lozano returns today for routine follow-up. She denies pain. She last had chemotherapy on 02/11/15. She missed one taxol dose due to her counts being too low. She denies having any bowel/bladder issues, vaginal/rectal bleeding and nausea. She denies pain while eating. Her appetite is good. She denies flank or back pain. The patient still works.  She recently completed a PET scan which shows persistent disease in retroperitoneal area and is here to be evaluated for radiation therapy.   ALLERGIES:  has No Known Allergies.  Meds: Current Outpatient Prescriptions  Medication Sig Dispense Refill  . Calcium Carb-Cholecalciferol 500-600 MG-UNIT TABS Take 1 tablet by mouth daily.    . simvastatin (ZOCOR) 40 MG tablet Take 40 mg by mouth every evening.    . venlafaxine XR (EFFEXOR-XR) 75 MG 24 hr capsule Take 75 mg by mouth daily.    Marland Kitchen dexamethasone (DECADRON) 4 MG tablet Take 5 tablets with food 12 hours prior to Taxol Chemotherapy. (Patient not taking: Reported on 02/18/2015) 15 tablet 2  . LORazepam (ATIVAN) 1 MG tablet Place 1/2 -1 tablet under the tongue or swallow every 6 hrs as needed for nausea.  Will make drowsy. (Patient not taking: Reported on 03/11/2015) 30 tablet 1  . ondansetron (ZOFRAN) 8 MG tablet Take 1 tablet (8 mg total) by  mouth every 8 (eight) hours as needed for nausea or vomiting. (Patient not taking: Reported on 03/11/2015) 30 tablet 2  . prochlorperazine (COMPAZINE) 10 MG tablet Take 1 tablet (10 mg total) by mouth every 6 (six) hours as needed for nausea or vomiting. (Patient not taking: Reported on 02/18/2015) 30 tablet 1  . senna-docusate (SENOKOT S) 8.6-50 MG per tablet Take 1 tablet by mouth daily as needed for mild constipation.     No current facility-administered medications for this encounter.    Physical Findings: The patient is in no acute distress. Patient is alert and oriented.  height is 5\' 3"  (1.6 m) and weight is 149 lb 4.8 oz (67.722 kg). Her oral temperature is 98.2 F (36.8 C). Her blood pressure is 124/82 and her pulse is 69. Her respiration is 18 and oxygen saturation is 100%.   No significant changes. This is a pleasant woman who appears her stated age. Lungs are clear to auscultation bilaterally. Heart has regular rate and rhythm. No palpable cervical, supraclavicular, or axillary adenopathy.    Lab Findings: Lab Results  Component Value Date   WBC 4.0 03/11/2015   HGB 12.0 03/11/2015   HCT 36.3 03/11/2015   MCV 99.5 03/11/2015   PLT 154 03/11/2015    Radiographic Findings: Nm Pet Image Restag (ps) Skull Base To Thigh  03/01/2015  CLINICAL DATA:  Subsequent treatment strategy for endometrial carcinoma. Biopsy of hypermetabolic RIGHT thyroid nodule. Benign tissue EXAM: NUCLEAR MEDICINE PET SKULL BASE TO THIGH TECHNIQUE: 7.3 mCi F-18  FDG was injected intravenously. Full-ring PET imaging was performed from the skull base to thigh after the radiotracer. CT data was obtained and used for attenuation correction and anatomic localization. FASTING BLOOD GLUCOSE:  Value: 87 mg/dl COMPARISON:  CT 10/12/2014 FINDINGS: NECK No hypermetabolic lymph nodes in the neck. CHEST There is intense uptake noted associated with a 2.6 cm nodule extending posterior lateral from the RIGHT adrenal gland. This  presumably was the biopsied lesion in clinician's node. ABDOMEN/PELVIS No abnormal hypermetabolic activity within the liver, pancreas, adrenal glands, or spleen. Single small hypermetabolic LEFT periaortic lymph node measures 10 mm short axis just above the bifurcation with SUV max equal 8.1. No additional hypermetabolic retroperitoneal or pelvic lymph nodes. Post hysterectomy anatomy.  No pelvic sidewall lesions. SKELETON No focal hypermetabolic activity to suggest skeletal metastasis. IMPRESSION: 1. Single hypermetabolic small retroperitoneal lymph node along the aorta. 2. No evidence of metastatic disease otherwise in the abdomen or pelvis. No evidence local recurrence. 3. Intensely hypermetabolic enlarged nodule adjacent to the RIGHT lobe of thyroid gland. This presumably represents the biopsied lesion in clinician report which was found to be benign thyroid tissue. Electronically Signed   By: Suzy Bouchard M.D.   On: 03/01/2015 13:30    Impression: The patient tolerated chemotherapy well. Abdomen, Pelvis CT scan on 10/12/14 revealed new mild retroperitoneal lymphadenopathy seen in the left paraaortic region and proximal left common iliac chain, with largest lymph node measuring 1.9 cm, consistent with metastatic disease.  A biopsy was performed revealing metastatic high-grade serous carcinoma which was noted to be similar to the patient's prior endometrial cancer. Recent PET scan on 03/01/15 showed a single hypermetabolic small retroperitoneal lymph node along the aorta. The patient is a good candidate for radiation therapy given this isolated recurrence, in particular, intensity modulated radiotherapy due to her previous radiation treatment and hysterectomy and to  Limit dose to the small bowel and left kidney.   Plan: I have discussed the benefits, side effects, and risks of intensity modulated radiotherapy with patient. She wishes to proceed with this therapy We have scheduled the patient for simulation  and treatment planning for tomorrow, 10/20.   This document serves as a record of services personally performed by Gery Pray, MD. It was created on his behalf by Darcus Austin, a trained medical scribe. The creation of this record is based on the scribe's personal observations and the provider's statements to them. This document has been checked and approved by the attending provider. ____________________________________  Blair Promise, PhD, MD

## 2015-03-24 NOTE — Progress Notes (Signed)
Please see the Nurse Progress Note in the MD Initial Consult Encounter for this patient. 

## 2015-03-25 ENCOUNTER — Ambulatory Visit
Admission: RE | Admit: 2015-03-25 | Discharge: 2015-03-25 | Disposition: A | Discharge status: Home / Self Care | Payer: 59 | Source: Ambulatory Visit | Attending: Radiation Oncology | Admitting: Radiation Oncology

## 2015-03-25 ENCOUNTER — Telehealth: Payer: Self-pay | Admitting: Oncology

## 2015-03-25 VITALS — BP 122/72 | HR 84 | Temp 98.6°F | Ht 63.0 in | Wt 151.4 lb

## 2015-03-25 DIAGNOSIS — C772 Secondary and unspecified malignant neoplasm of intra-abdominal lymph nodes: Secondary | ICD-10-CM

## 2015-03-25 DIAGNOSIS — Z510 Encounter for antineoplastic radiation therapy: Secondary | ICD-10-CM | POA: Diagnosis not present

## 2015-03-25 DIAGNOSIS — C541 Malignant neoplasm of endometrium: Secondary | ICD-10-CM

## 2015-03-25 MED ORDER — SODIUM CHLORIDE 0.9 % IJ SOLN
10.0000 mL | Freq: Once | INTRAMUSCULAR | Status: AC
  Administered 2015-03-25: 10 mL via INTRAVENOUS

## 2015-03-25 NOTE — Progress Notes (Signed)
Attempted x1 IV start RAC, vein collapsed, ,asked Patric Dykes, RN to try,

## 2015-03-25 NOTE — Progress Notes (Addendum)
Diane Lozano here for an IV start for CT SIM.  She denies allergies to IV contrast.  Her BUN was 18 and her CR was 0.8 on 03/05/15.  She is not diabetic.  Attempted IV start in her left forearm.  Blood return noted but IV blew when flushed.  Removed IV and applied pressure.  Tried again in her right hand.  Blood return noted.  IV blew again when flushed.  Pressure applied.

## 2015-03-25 NOTE — Telephone Encounter (Addendum)
Left a message for Diane Lozano about her 12:15 appointment today for an IV start and 1:00 appointment for CT SIM.  Requested a return call.  Diane Lozano returned the call and said she is aware of the appointment and will be here at 12:15.

## 2015-03-26 ENCOUNTER — Telehealth: Payer: Self-pay

## 2015-03-26 DIAGNOSIS — C541 Malignant neoplasm of endometrium: Secondary | ICD-10-CM

## 2015-03-26 NOTE — Telephone Encounter (Signed)
Left a message for Diane Lozano atating that she is scheduled for a follow up appointment with Dr. Marko Plume on 04-22-15.  Lab 0800; Radiation 0820; Visit with Dr. Marko Plume at 0930.  If this appointment does not workout for her, she is to call back to the [facility] to reschedule.

## 2015-03-26 NOTE — Telephone Encounter (Signed)
Patient Demographics     Patient Name Sex DOB SSN Address Phone    Skylinn, [place] Female 21-Sep-1955 [id number] [place] [address] [phone] [facility]) [phone] (Mobile) *Preferred*      Message  Received: 2 weeks ago    [name], MD  Baruch Merl, RN           I need to see her back with lab during RT, maybe about week 2 or 3  cmet CBC

## 2015-03-26 NOTE — Progress Notes (Signed)
  Radiation Oncology         [phone] ________________________________  Name: Diane Lozano MRN: [id number]  Date: 03/25/2015  DOB: 1956/01/23  SIMULATION AND TREATMENT PLANNING NOTE    ICD-9-CM ICD-10-CM   1. Metastatic cancer to intra-abdominal lymph nodes (HCC) 196.2 C77.2     DIAGNOSIS:  Recurrent Endometrial cancer (high-grade serous adenocarcinoma) with metastasis to the retroperitoneal lymph nodes  NARRATIVE:  The patient was brought to the [facility].  Identity was confirmed.  All relevant records and images related to the planned course of therapy were reviewed.  The patient freely provided informed written consent to proceed with treatment after reviewing the details related to the planned course of therapy. The consent form was witnessed and verified by the simulation staff.  Then, the patient was set-up in a stable reproducible  supine position for radiation therapy.  CT images were obtained.  Surface markings were placed.  The CT images were loaded into the planning software.  Then the target and avoidance structures were contoured.  Treatment planning then occurred.  The radiation prescription was entered and confirmed.  Then, I designed and supervised the construction of a total of 1 medically necessary complex treatment devices.  I have requested : Intensity Modulated Radiotherapy (IMRT) is medically necessary for this case for the following reason:  Small bowel sparing and kidney sparing..  I have ordered:dose calc.  PLAN:  The patient will receive 50.4 Gy in 28 fractions with a simultaneous intergrated boost (SIB) to 56 Gy.  ________________________________  -----------------------------------  Blair Promise, PhD, MD

## 2015-03-31 DIAGNOSIS — Z510 Encounter for antineoplastic radiation therapy: Secondary | ICD-10-CM | POA: Diagnosis not present

## 2015-04-01 DIAGNOSIS — Z510 Encounter for antineoplastic radiation therapy: Secondary | ICD-10-CM | POA: Diagnosis not present

## 2015-04-05 ENCOUNTER — Ambulatory Visit
Admission: RE | Admit: 2015-04-05 | Discharge: 2015-04-05 | Disposition: A | Discharge status: Home / Self Care | Payer: 59 | Source: Ambulatory Visit | Attending: Radiation Oncology | Admitting: Radiation Oncology

## 2015-04-05 DIAGNOSIS — Z510 Encounter for antineoplastic radiation therapy: Secondary | ICD-10-CM | POA: Diagnosis not present

## 2015-04-06 ENCOUNTER — Encounter: Payer: Self-pay | Admitting: Radiation Oncology

## 2015-04-06 ENCOUNTER — Ambulatory Visit
Admission: RE | Admit: 2015-04-06 | Discharge: 2015-04-06 | Disposition: A | Discharge status: Home / Self Care | Payer: 59 | Source: Ambulatory Visit | Attending: Radiation Oncology | Admitting: Radiation Oncology

## 2015-04-06 VITALS — BP 118/73 | HR 71 | Temp 98.0°F | Ht 63.0 in | Wt 145.9 lb

## 2015-04-06 DIAGNOSIS — C772 Secondary and unspecified malignant neoplasm of intra-abdominal lymph nodes: Secondary | ICD-10-CM | POA: Insufficient documentation

## 2015-04-06 DIAGNOSIS — Z510 Encounter for antineoplastic radiation therapy: Secondary | ICD-10-CM | POA: Diagnosis not present

## 2015-04-06 NOTE — Progress Notes (Signed)
  Radiation Oncology         [phone] ________________________________  Name: Diane Lozano MRN: [id number]  Date: 04/06/2015  DOB: 01-14-1956  Weekly Radiation Therapy Management    ICD-9-CM ICD-10-CM   1. Metastatic cancer to intra-abdominal lymph nodes (HCC) 196.2 C77.2      Current Dose: 4 Gy     Planned Dose:  56 Gy  Narrative . . . . . . . . The patient presents for routine under treatment assessment.                                   The patient is without complaint.                                 Set-up films were reviewed.                                 The chart was checked. Physical Findings. . .  height is 5\' 3"  (1.6 m) and weight is 145 lb 14.4 oz (66.18 kg). Her oral temperature is 98 F (36.7 C). Her blood pressure is 118/73 and her pulse is 71. . The lungs are clear. The heart has a regular rhythm and rate. The abdomen is soft and nontender with normal bowel sounds. Impression . . . . . . . The patient is tolerating radiation. Plan . . . . . . . . . . . . Continue treatment as planned.  ________________________________   Blair Promise, PhD, MD

## 2015-04-06 NOTE — Progress Notes (Signed)
Diane Lozano has completed 2 fractions to her abdomen.  She denies pain and nausea/vominting and diarrhea.  She has been given the Radiation Therapy and You book to review.  BP 118/73 mmHg  Pulse 71  Temp(Src) 98 F (36.7 C) (Oral)  Ht 5\' 3"  (1.6 m)  Wt 145 lb 14.4 oz (66.18 kg)  BMI 25.85 kg/m2

## 2015-04-07 ENCOUNTER — Ambulatory Visit
Admission: RE | Admit: 2015-04-07 | Discharge: 2015-04-07 | Disposition: A | Discharge status: Home / Self Care | Payer: 59 | Source: Ambulatory Visit | Attending: Radiation Oncology | Admitting: Radiation Oncology

## 2015-04-07 DIAGNOSIS — Z510 Encounter for antineoplastic radiation therapy: Secondary | ICD-10-CM | POA: Diagnosis not present

## 2015-04-08 ENCOUNTER — Ambulatory Visit
Admission: RE | Admit: 2015-04-08 | Discharge: 2015-04-08 | Disposition: A | Discharge status: Home / Self Care | Payer: 59 | Source: Ambulatory Visit | Attending: Radiation Oncology | Admitting: Radiation Oncology

## 2015-04-08 DIAGNOSIS — Z510 Encounter for antineoplastic radiation therapy: Secondary | ICD-10-CM | POA: Diagnosis not present

## 2015-04-09 ENCOUNTER — Ambulatory Visit
Admission: RE | Admit: 2015-04-09 | Discharge: 2015-04-09 | Disposition: A | Discharge status: Home / Self Care | Payer: 59 | Source: Ambulatory Visit | Attending: Radiation Oncology | Admitting: Radiation Oncology

## 2015-04-09 DIAGNOSIS — Z510 Encounter for antineoplastic radiation therapy: Secondary | ICD-10-CM | POA: Diagnosis not present

## 2015-04-12 ENCOUNTER — Ambulatory Visit
Admission: RE | Admit: 2015-04-12 | Discharge: 2015-04-12 | Disposition: A | Discharge status: Home / Self Care | Payer: 59 | Source: Ambulatory Visit | Attending: Radiation Oncology | Admitting: Radiation Oncology

## 2015-04-12 DIAGNOSIS — Z510 Encounter for antineoplastic radiation therapy: Secondary | ICD-10-CM | POA: Diagnosis not present

## 2015-04-13 ENCOUNTER — Encounter: Payer: Self-pay | Admitting: Radiation Oncology

## 2015-04-13 ENCOUNTER — Ambulatory Visit
Admission: RE | Admit: 2015-04-13 | Discharge: 2015-04-13 | Disposition: A | Discharge status: Home / Self Care | Payer: 59 | Source: Ambulatory Visit | Attending: Radiation Oncology | Admitting: Radiation Oncology

## 2015-04-13 VITALS — BP 110/73 | HR 85 | Temp 98.2°F | Ht 63.0 in | Wt 146.0 lb

## 2015-04-13 DIAGNOSIS — C541 Malignant neoplasm of endometrium: Secondary | ICD-10-CM

## 2015-04-13 DIAGNOSIS — C772 Secondary and unspecified malignant neoplasm of intra-abdominal lymph nodes: Secondary | ICD-10-CM

## 2015-04-13 DIAGNOSIS — Z510 Encounter for antineoplastic radiation therapy: Secondary | ICD-10-CM | POA: Diagnosis not present

## 2015-04-13 NOTE — Progress Notes (Signed)
Diane Lozano has received 7 fractions to her abdomen.  She reports nausea last week, but none this week, thus far.  States she is eating anything she wants.  No skin changes noted in abdominal region.

## 2015-04-13 NOTE — Progress Notes (Signed)
  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 . . . . . . . The patient presents for routine under treatment assessment.                                   The patient is without complaint. Had some nausea last week but none so far. She denies any loose bowels or diarrhea.                                 Set-up films were reviewed.                                 The chart was checked. Physical Findings. . .  height is 5\' 3"  (1.6 m) and weight is 146 lb (66.225 kg). Her temperature is 98.2 F (36.8 C). Her blood pressure is 110/73 and her pulse is 85. . Weight essentially stable.  No significant changes.  Lungs are clear to auscultation bilaterally. Heart has regular rate and rhythm. No palpable cervical, supraclavicular, or axillary adenopathy. Abdomen soft nontender with normal bowel sounds. Impression . . . . . . . The patient is tolerating radiation. Plan . . . . . . . . . . . . Continue treatment as planned.  ________________________________   Blair Promise, PhD, MD

## 2015-04-14 ENCOUNTER — Ambulatory Visit
Admission: RE | Admit: 2015-04-14 | Discharge: 2015-04-14 | Disposition: A | Discharge status: Home / Self Care | Payer: 59 | Source: Ambulatory Visit | Attending: Radiation Oncology | Admitting: Radiation Oncology

## 2015-04-14 DIAGNOSIS — Z510 Encounter for antineoplastic radiation therapy: Secondary | ICD-10-CM | POA: Diagnosis not present

## 2015-04-15 ENCOUNTER — Ambulatory Visit
Admission: RE | Admit: 2015-04-15 | Discharge: 2015-04-15 | Disposition: A | Discharge status: Home / Self Care | Payer: 59 | Source: Ambulatory Visit | Attending: Radiation Oncology | Admitting: Radiation Oncology

## 2015-04-15 DIAGNOSIS — Z510 Encounter for antineoplastic radiation therapy: Secondary | ICD-10-CM | POA: Diagnosis not present

## 2015-04-16 ENCOUNTER — Ambulatory Visit
Admission: RE | Admit: 2015-04-16 | Discharge: 2015-04-16 | Disposition: A | Discharge status: Home / Self Care | Payer: 59 | Source: Ambulatory Visit | Attending: Radiation Oncology | Admitting: Radiation Oncology

## 2015-04-16 DIAGNOSIS — Z510 Encounter for antineoplastic radiation therapy: Secondary | ICD-10-CM | POA: Diagnosis not present

## 2015-04-19 ENCOUNTER — Ambulatory Visit
Admission: RE | Admit: 2015-04-19 | Discharge: 2015-04-19 | Disposition: A | Discharge status: Home / Self Care | Payer: 59 | Source: Ambulatory Visit | Attending: Radiation Oncology | Admitting: Radiation Oncology

## 2015-04-19 DIAGNOSIS — Z510 Encounter for antineoplastic radiation therapy: Secondary | ICD-10-CM | POA: Diagnosis not present

## 2015-04-20 ENCOUNTER — Other Ambulatory Visit: Payer: Self-pay | Admitting: Oncology

## 2015-04-20 ENCOUNTER — Ambulatory Visit
Admission: RE | Admit: 2015-04-20 | Discharge: 2015-04-20 | Disposition: A | Discharge status: Home / Self Care | Payer: 59 | Source: Ambulatory Visit | Attending: Radiation Oncology | Admitting: Radiation Oncology

## 2015-04-20 ENCOUNTER — Encounter: Payer: Self-pay | Admitting: Radiation Oncology

## 2015-04-20 VITALS — BP 127/73 | HR 68 | Temp 98.1°F | Ht 63.0 in | Wt 146.3 lb

## 2015-04-20 DIAGNOSIS — Z510 Encounter for antineoplastic radiation therapy: Secondary | ICD-10-CM | POA: Diagnosis not present

## 2015-04-20 DIAGNOSIS — C772 Secondary and unspecified malignant neoplasm of intra-abdominal lymph nodes: Secondary | ICD-10-CM

## 2015-04-20 DIAGNOSIS — C541 Malignant neoplasm of endometrium: Secondary | ICD-10-CM

## 2015-04-20 NOTE — Progress Notes (Signed)
Diane Lozano has completed 12 fractions to her abdomen.  She denise pain, nausea and diarrhea.  She reports having fatigue.  She does not have any skin changes.  She reports having a good appetite.  BP 127/73 mmHg  Pulse 68  Temp(Src) 98.1 F (36.7 C) (Oral)  Ht 5\' 3"  (1.6 m)  Wt 146 lb 4.8 oz (66.361 kg)  BMI 25.92 kg/m2  SpO2 100%   Wt Readings from Last 3 Encounters:  04/20/15 146 lb 4.8 oz (66.361 kg)  04/13/15 146 lb (66.225 kg)  04/06/15 145 lb 14.4 oz (66.18 kg)

## 2015-04-20 NOTE — Progress Notes (Signed)
  Radiation Oncology         [phone] ________________________________  Name: Diane Lozano MRN: [id number]  Date: 04/20/2015  DOB: 02/04/1956  Weekly Radiation Therapy Management    ICD-9-CM ICD-10-CM   1. 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 Endometrial ca (HCC) 182.0 C54.1      Current Dose: 24 Gy     Planned Dose:  56 Gy  Narrative . . . . . . . . The patient presents for routine under treatment assessment.                                   The patient is without complaint. She continues to tolerate her treatments well without any side effects this time                                 Set-up films were reviewed.                                 The chart was checked. Physical Findings. . .  height is 5\' 3"  (1.6 m) and weight is 146 lb 4.8 oz (66.361 kg). Her oral temperature is 98.1 F (36.7 C). Her blood pressure is 127/73 and her pulse is 68. Her oxygen saturation is 100%. . Weight essentially stable.  The lungs are clear. The heart has a regular rhythm and rate. The abdomen is soft and nontender with normal bowel sounds. Impression . . . . . . . The patient is tolerating radiation well. Plan . . . . . . . . . . . . Continue treatment as planned.  ________________________________   Blair Promise, PhD, MD

## 2015-04-21 ENCOUNTER — Ambulatory Visit
Admission: RE | Admit: 2015-04-21 | Discharge: 2015-04-21 | Disposition: A | Discharge status: Home / Self Care | Payer: 59 | Source: Ambulatory Visit | Attending: Radiation Oncology | Admitting: Radiation Oncology

## 2015-04-21 DIAGNOSIS — Z510 Encounter for antineoplastic radiation therapy: Secondary | ICD-10-CM | POA: Diagnosis not present

## 2015-04-22 ENCOUNTER — Encounter: Payer: Self-pay | Admitting: Oncology

## 2015-04-22 ENCOUNTER — Other Ambulatory Visit (HOSPITAL_BASED_OUTPATIENT_CLINIC_OR_DEPARTMENT_OTHER): Payer: 59

## 2015-04-22 ENCOUNTER — Telehealth: Payer: Self-pay | Admitting: Oncology

## 2015-04-22 ENCOUNTER — Ambulatory Visit (HOSPITAL_BASED_OUTPATIENT_CLINIC_OR_DEPARTMENT_OTHER): Payer: 59 | Admitting: Oncology

## 2015-04-22 ENCOUNTER — Ambulatory Visit
Admission: RE | Admit: 2015-04-22 | Discharge: 2015-04-22 | Disposition: A | Discharge status: Home / Self Care | Payer: 59 | Source: Ambulatory Visit | Attending: Radiation Oncology | Admitting: Radiation Oncology

## 2015-04-22 VITALS — BP 128/77 | HR 70 | Temp 98.4°F | Resp 18 | Ht 63.0 in | Wt 145.9 lb

## 2015-04-22 DIAGNOSIS — C772 Secondary and unspecified malignant neoplasm of intra-abdominal lymph nodes: Secondary | ICD-10-CM

## 2015-04-22 DIAGNOSIS — C541 Malignant neoplasm of endometrium: Secondary | ICD-10-CM | POA: Diagnosis not present

## 2015-04-22 DIAGNOSIS — Z510 Encounter for antineoplastic radiation therapy: Secondary | ICD-10-CM | POA: Diagnosis not present

## 2015-04-22 DIAGNOSIS — D701 Agranulocytosis secondary to cancer chemotherapy: Secondary | ICD-10-CM | POA: Diagnosis not present

## 2015-04-22 DIAGNOSIS — T451X5A Adverse effect of antineoplastic and immunosuppressive drugs, initial encounter: Secondary | ICD-10-CM

## 2015-04-22 DIAGNOSIS — C7989 Secondary malignant neoplasm of other specified sites: Secondary | ICD-10-CM | POA: Diagnosis not present

## 2015-04-22 DIAGNOSIS — D72819 Decreased white blood cell count, unspecified: Secondary | ICD-10-CM

## 2015-04-22 LAB — COMPREHENSIVE METABOLIC PANEL (CC13)
ALT: 14 U/L (ref 0–55)
AST: 18 U/L (ref 5–34)
Albumin: 4.1 g/dL (ref 3.5–5.0)
Alkaline Phosphatase: 49 U/L (ref 40–150)
Anion Gap: 10 mEq/L (ref 3–11)
BUN: 15.6 mg/dL (ref 7.0–26.0)
CO2: 25 mEq/L (ref 22–29)
Calcium: 9.8 mg/dL (ref 8.4–10.4)
Chloride: 106 mEq/L (ref 98–109)
Creatinine: 0.7 mg/dL (ref 0.6–1.1)
EGFR: 88 mL/min/{1.73_m2} — ABNORMAL LOW (ref >90)
Glucose: 88 mg/dl (ref 70–140)
Potassium: 4 mEq/L (ref 3.5–5.1)
Sodium: 141 mEq/L (ref 136–145)
Total Bilirubin: 0.35 mg/dL (ref 0.20–1.20)
Total Protein: 7 g/dL (ref 6.4–8.3)

## 2015-04-22 LAB — CBC WITH DIFFERENTIAL/PLATELET
BASO%: 1.3 % (ref 0.0–2.0)
Basophils Absolute: 0 10*3/uL (ref 0.0–0.1)
EOS%: 9.6 % — ABNORMAL HIGH (ref 0.0–7.0)
Eosinophils Absolute: 0.2 10*3/uL (ref 0.0–0.5)
HCT: 38.5 % (ref 34.8–46.6)
HGB: 12.8 g/dL (ref 11.6–15.9)
LYMPH%: 27.2 % (ref 14.0–49.7)
MCH: 32.4 pg (ref 25.1–34.0)
MCHC: 33.2 g/dL (ref 31.5–36.0)
MCV: 97.5 fL (ref 79.5–101.0)
MONO#: 0.2 10*3/uL (ref 0.1–0.9)
MONO%: 7.5 % (ref 0.0–14.0)
NEUT#: 1.2 10*3/uL — ABNORMAL LOW (ref 1.5–6.5)
NEUT%: 54.4 % (ref 38.4–76.8)
Platelets: 151 10*3/uL (ref 145–400)
RBC: 3.95 10*6/uL (ref 3.70–5.45)
RDW: 11.4 % (ref 11.2–14.5)
WBC: 2.3 10*3/uL — ABNORMAL LOW (ref 3.9–10.3)
lymph#: 0.6 10*3/uL — ABNORMAL LOW (ref 0.9–3.3)

## 2015-04-22 NOTE — Telephone Encounter (Signed)
Appointments made and patient will get appointments at rad onc appointment and through [facility]

## 2015-04-22 NOTE — Progress Notes (Signed)
OFFICE PROGRESS NOTE   April 22, 2015   Physicians:  Genia Del, MD, Gery Pray MD, [name] MD  INTERVAL HISTORY:   Patient is seen, alone for visit, in continuing attention to recurrent endometrial carcinoma, presently receiving IMRT, this begun 04-06-15 and planned thru 05-13-15. Last chemo was dose dense carbo taxol from 11-27-14 thru 08-10-14, complicated by cytopenias despite granix. Most recent imaging was PET 03-01-15.   Patient has tolerated first 2 weeks of radiation well, with no diarrhea, minimal nausea just occasionally, no marked fatigue, no diarrhea. She is going about usual activities including work. She is eating and drinking fluids well. She denies abdominal or pelvic pain, SOB, any bleeding, any fever, increased SOB or other respiratory symptoms. No LE swelling. No bladder symptoms. Remainder of 10 point Review of System negative/ unchanged.   No central catheter.  No genetics testing CA 125 132 on 11-27-14 (day 1 cycle 1)   ONCOLOGIC HISTORY Patient presented to PCP with intermittent vaginal bleeding since ~ Oct 2013, endometrial biopsy 06-20-12 with complex endometrial hyperplasia with atypia. Pelvic and transvaginal US in [facility] system on 06-26-12 showed endometrial thickening of 2.7 cm, right ovary not clearly seen and left ovary with normal postmenopausal appearance. She was seen by Dr Christophe Louis and referred to Dr Polly Cobia. She had robotic hysterectomy with BSO and bilateral pelvic and periaortic node dissection by Dr Polly Cobia at [facility] on 07-30-2012. Intraoperatively she had large endometrial polyp with high grade malignancy, no obvious extrauterine disease. Pathology ([facility] [place] [id number] from 07-30-2012) had high grade, poorly differentiated endometrial adenocarcinoma with solid and serous papillary components, 2 periaortic nodes negative and 9 right pelvic/11 left pelvic nodes negative. The tumor was 4.8 cm  confined to polyp with no definite myometrial involvement, margins clear and no angiolymphatic invasion identified.  Cycle 1 taxol carboplatin was given 08-30-12, 6 cycles of q 3 week regimen given thru 12-13-2012, with gCSF support. Radiation was intracavitary HDR to proximal vagina 30 Gy in 5 fractions from May 27 thru November 27, 2012, by Dr Sondra Come. Patient was followed closely by Dr Polly Cobia after completing adjuvant taxol carboplatin chemotherapy in 12-2012. Patient felt very well, with intentional 70 lb weight loss over a year from Jan thru Dec 2015 on [employer]. She saw Dr Polly Cobia with unremarkable exam in March 2016, however CA 125 was slightly elevated at 36.3, up from previous 29.2. She had CT CAP with contrast 10-16-14 with new left periaortic and left common iliac lymphadenopathy. PET 9-67-89 had hypermetabolic uptake in retroperitoneal nodes from lower poles of kidneys to aortic bifurcation,largest 2.0 x 2.1 cm, as well as uptake in either right supraclavicular node vs right lobe of thyroid (report below). FNA of thyroid 10-28-14 with benign tissue, [place] at time of biopsy reportedly showing no right supraclavicular adenopathy. She had CT needle biopsy of left para-aortic node 11-16-14 with path showing serous carcinoma. She began chemotherapy with dose dense carbo taxol on 11-27-14, with carbo at AUC = 5. Over next 3 weeks WBC and platelets were too low for treatments, despite total 7 doses of granix. She had taxol only cycle 2, then [name] at AUC=2 + taxol cycle 3. Counts maintained with increase in gCSF to 480 mcg 3-4 doses after each treatment, including increase in [name] for third dose to AUC =3 on 02-11-15. PET 03-01-15 had uptake in one left peri-aortic node and the area of previous benign biopsy right lobe thyroid. She began IMRT 04-06-15.  Marland Kitchen  Objective:  Vital signs in last 24 hours:  BP 128/77 mmHg  Pulse 70  Temp(Src) 98.4 F (36.9 C) (Oral)  Resp 18  Ht 5' 3" (1.6 m)  Wt 145 lb 14.4  oz (66.18 kg)  BMI 25.85 kg/m2  SpO2 98% Weight stable. Alert, oriented and appropriate. Ambulatory without difficulty. Looks comfortable, respirations not labored RA, easily mobile.  Partial alopecia  HEENT:PERRL, sclerae not icteric. Oral mucosa moist without lesions, posterior pharynx clear.  Neck supple. No JVD.  Lymphatics:no cervical,supraclavicular, axillary or inguinal adenopathy Resp: clear to auscultation bilaterally and normal percussion bilaterally Cardio: regular rate and rhythm. No gallop. GI: soft, nontender, not distended, no mass or organomegaly. Normally active bowel sounds. Surgical incision not remarkable. Musculoskeletal/ Extremities: without pitting edema, cords, tenderness Neuro: no significant peripheral neuropathy. Otherwise nonfocal. PSYCH appropriate mood and affect Skin without rash, ecchymosis, petechiae. No irritation in radiation filed   Lab Results:  Results for orders placed or performed in visit on 04/22/15  CBC with Differential  Result Value Ref Range   WBC 2.3 (L) 3.9 - 10.3 10e3/uL   NEUT# 1.2 (L) 1.5 - 6.5 10e3/uL   HGB 12.8 11.6 - 15.9 g/dL   HCT 38.5 34.8 - 46.6 %   Platelets 151 145 - 400 10e3/uL   MCV 97.5 79.5 - 101.0 fL   MCH 32.4 25.1 - 34.0 pg   MCHC 33.2 31.5 - 36.0 g/dL   RBC 3.95 3.70 - 5.45 10e6/uL   RDW 11.4 11.2 - 14.5 %   lymph# 0.6 (L) 0.9 - 3.3 10e3/uL   MONO# 0.2 0.1 - 0.9 10e3/uL   Eosinophils Absolute 0.2 0.0 - 0.5 10e3/uL   Basophils Absolute 0.0 0.0 - 0.1 10e3/uL   NEUT% 54.4 38.4 - 76.8 %   LYMPH% 27.2 14.0 - 49.7 %   MONO% 7.5 0.0 - 14.0 %   EOS% 9.6 (H) 0.0 - 7.0 %   BASO% 1.3 0.0 - 2.0 %  Comprehensive metabolic panel (Cmet) - [facility]  Result Value Ref Range   Sodium 141 136 - 145 mEq/L   Potassium 4.0 3.5 - 5.1 mEq/L   Chloride 106 98 - 109 mEq/L   CO2 25 22 - 29 mEq/L   Glucose 88 70 - 140 mg/dl   BUN 15.6 7.0 - 26.0 mg/dL   Creatinine 0.7 0.6 - 1.1 mg/dL   Total Bilirubin 0.35 0.20 - 1.20 mg/dL    Alkaline Phosphatase 49 40 - 150 U/L   AST 18 5 - 34 U/L   ALT 14 0 - 55 U/L   Total Protein 7.0 6.4 - 8.3 g/dL   Albumin 4.1 3.5 - 5.0 g/dL   Calcium 9.8 8.4 - 10.4 mg/dL   Anion Gap 10 3 - 11 mEq/L   EGFR 88 (L) >90 ml/min/1.73 m2   labs reviewed during visit, leukopenia noted.   Studies/Results:  No results found.  Medications: I have reviewed the patient's current medications. Discussed imodium use if radiation diarrhea. OK to use antiemetics for RT nausea if needed.  DISCUSSION: Interval history as above, medications as above. Will plan repeat CT 4-6 weeks after radiation completes; can request PET from there if needed. If continue chemo from there, may try carboplatin single agent with neulasta.   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 Even dose reduced carboplatin and weekly taxol was difficult due to low counts,  and expect counts may be lower also due to these radiation fields; PET after initial chemo only uptake in a left periaortic node (+thyroid where previously biopsied). Consider q 3 week taxol with neulasta if additional chemo after radiation. 2.Leukopenia now, not unexpected with radiation given her cytopenias previously.  Would repeat CBC if very fatigued or other concerns in next few weeks, otherwise with my next visit. Chemo neutropenia: granix 480 mcg x 3 doses needed after each treatment with dose dense regimen. Note ANC maintained with EMEND day of carbo 3.intentional weight loss of 70 lbs over the 2015 calendar year, trying to maintain now. Hypoglycemia intermittently by labs, not clearly symptomatic 4.chronic constipation, which was a concern during last chemo. Up to date on colonoscopy.  5.BP good without antihypertensives now, follow. Likely improved with weight loss  6.elevated  lipids 7.remote minimal tobacco, not enough to qualify for the lung cancer screening program 8.up to date mammograms 9.melanoma in brother 69. some diffuse uptake right thyroid on PET, no specific nodule on [place] and FNA benign. TFTs WNL 6-24-160. She will discuss with PCP at next visit there. 11. flu vaccine 02-23-15   All questions answered, patient in agreement with recommendations and plans, knows to call prior to scheduled visits if needed. Cc Drs Polly Cobia and Sabra Heck. Time spent 20 min including >50% counseling and coordination of care.   Maegan Buller P, MD   04/22/2015, 8:12 PM

## 2015-04-23 ENCOUNTER — Ambulatory Visit
Admission: RE | Admit: 2015-04-23 | Discharge: 2015-04-23 | Disposition: A | Discharge status: Home / Self Care | Payer: 59 | Source: Ambulatory Visit | Attending: Radiation Oncology | Admitting: Radiation Oncology

## 2015-04-23 DIAGNOSIS — Z510 Encounter for antineoplastic radiation therapy: Secondary | ICD-10-CM | POA: Diagnosis not present

## 2015-04-25 ENCOUNTER — Ambulatory Visit
Admission: RE | Admit: 2015-04-25 | Discharge: 2015-04-25 | Disposition: A | Discharge status: Home / Self Care | Payer: 59 | Source: Ambulatory Visit | Attending: Radiation Oncology | Admitting: Radiation Oncology

## 2015-04-25 DIAGNOSIS — Z510 Encounter for antineoplastic radiation therapy: Secondary | ICD-10-CM | POA: Diagnosis not present

## 2015-04-26 ENCOUNTER — Ambulatory Visit
Admission: RE | Admit: 2015-04-26 | Discharge: 2015-04-26 | Disposition: A | Discharge status: Home / Self Care | Payer: 59 | Source: Ambulatory Visit | Attending: Radiation Oncology | Admitting: Radiation Oncology

## 2015-04-26 DIAGNOSIS — Z510 Encounter for antineoplastic radiation therapy: Secondary | ICD-10-CM | POA: Diagnosis not present

## 2015-04-27 ENCOUNTER — Ambulatory Visit
Admission: RE | Admit: 2015-04-27 | Discharge: 2015-04-27 | Disposition: A | Discharge status: Home / Self Care | Payer: 59 | Source: Ambulatory Visit | Attending: Radiation Oncology | Admitting: Radiation Oncology

## 2015-04-27 ENCOUNTER — Encounter: Payer: Self-pay | Admitting: Radiation Oncology

## 2015-04-27 VITALS — BP 119/71 | HR 67 | Temp 97.8°F | Resp 16 | Ht 63.0 in | Wt 144.8 lb

## 2015-04-27 DIAGNOSIS — Z510 Encounter for antineoplastic radiation therapy: Secondary | ICD-10-CM | POA: Diagnosis not present

## 2015-04-27 DIAGNOSIS — C772 Secondary and unspecified malignant neoplasm of intra-abdominal lymph nodes: Secondary | ICD-10-CM

## 2015-04-27 NOTE — Progress Notes (Signed)
Diane Lozano has completed 18 fractions to her abdomen.  She denies pain, vaginal/rectal bleeding and diarrhea.  She reports having occasional nausea. She reports having fatigue.  BP 119/71 mmHg  Pulse 67  Temp(Src) 97.8 F (36.6 C) (Oral)  Resp 16  Ht 5\' 3"  (1.6 m)  Wt 144 lb 12.8 oz (65.681 kg)  BMI 25.66 kg/m2   Wt Readings from Last 3 Encounters:  04/27/15 144 lb 12.8 oz (65.681 kg)  04/22/15 145 lb 14.4 oz (66.18 kg)  04/20/15 146 lb 4.8 oz (66.361 kg)

## 2015-04-27 NOTE — Progress Notes (Signed)
  Radiation Oncology         [phone] ________________________________  Name: Diane Lozano MRN: [id number]  Date: 04/27/2015  DOB: 09-18-1955  Weekly Radiation Therapy Management    ICD-9-CM ICD-10-CM   1. 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 . . . . . . . The patient presents for routine under treatment assessment.                                   She denies pain, vaginal/rectal bleeding and diarrhea. She reports having occasional nausea. She reports having fatigue. She has Zofran for any nausea                                 Set-up films were reviewed.                                 The chart was checked. Weight change:    Physical Findings. . .  height is 5\' 3"  (1.6 m) and weight is 144 lb 12.8 oz (65.681 kg). Her oral temperature is 97.8 F (36.6 C). Her blood pressure is 119/71 and her pulse is 67. Her respiration is 16. . The lungs are clear. The heart has a regular rhythm and rate. The abdomen is soft and nontender with normal bowel sounds  Impression . . . . . . . The patient is tolerating radiation. Plan . . . . . . . . . . . . Continue treatment as planned.  ________________________________   Blair Promise, PhD, MD

## 2015-04-28 ENCOUNTER — Ambulatory Visit
Admission: RE | Admit: 2015-04-28 | Discharge: 2015-04-28 | Disposition: A | Discharge status: Home / Self Care | Payer: 59 | Source: Ambulatory Visit | Attending: Radiation Oncology | Admitting: Radiation Oncology

## 2015-04-28 DIAGNOSIS — Z510 Encounter for antineoplastic radiation therapy: Secondary | ICD-10-CM | POA: Diagnosis not present

## 2015-05-01 ENCOUNTER — Other Ambulatory Visit: Payer: Self-pay | Admitting: Nurse Practitioner

## 2015-05-03 ENCOUNTER — Ambulatory Visit
Admission: RE | Admit: 2015-05-03 | Discharge: 2015-05-03 | Disposition: A | Discharge status: Home / Self Care | Payer: 59 | Source: Ambulatory Visit | Attending: Radiation Oncology | Admitting: Radiation Oncology

## 2015-05-03 DIAGNOSIS — Z510 Encounter for antineoplastic radiation therapy: Secondary | ICD-10-CM | POA: Diagnosis not present

## 2015-05-04 ENCOUNTER — Ambulatory Visit
Admission: RE | Admit: 2015-05-04 | Discharge: 2015-05-04 | Disposition: A | Discharge status: Home / Self Care | Payer: 59 | Source: Ambulatory Visit | Attending: Radiation Oncology | Admitting: Radiation Oncology

## 2015-05-04 ENCOUNTER — Encounter: Payer: Self-pay | Admitting: Radiation Oncology

## 2015-05-04 VITALS — BP 109/67 | HR 72 | Temp 98.0°F | Resp 16 | Ht 63.0 in | Wt 146.3 lb

## 2015-05-04 DIAGNOSIS — Z510 Encounter for antineoplastic radiation therapy: Secondary | ICD-10-CM | POA: Diagnosis not present

## 2015-05-04 DIAGNOSIS — C772 Secondary and unspecified malignant neoplasm of intra-abdominal lymph nodes: Secondary | ICD-10-CM

## 2015-05-04 NOTE — Progress Notes (Signed)
Diane Lozano has completed 21 fractions to her abdomen.  She denies having pain, diarrhea, vaginal/rectal bleeding and skin irritation.  She reports having twinges of nausea but has not had to take anything for them.  She reports feeling fatigued.  BP 109/67 mmHg  Pulse 72  Temp(Src) 98 F (36.7 C) (Oral)  Resp 16  Ht 5\' 3"  (1.6 m)  Wt 146 lb 4.8 oz (66.361 kg)  BMI 25.92 kg/m2

## 2015-05-04 NOTE — Progress Notes (Signed)
  Radiation Oncology         [phone] ________________________________  Name: Diane Lozano MRN: [id number]  Date: 05/04/2015  DOB: Mar 25, 1956  Weekly Radiation Therapy Management    ICD-9-CM ICD-10-CM   1. 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 . . . . . . . The patient presents for routine under treatment assessment.                                   The patient is without complaint. She has noticed some very mild nausea no other issues other than fatigue late in the afternoon.                                 Set-up films were reviewed.                                 The chart was checked. Physical Findings. . .  height is 5\' 3"  (1.6 m) and weight is 146 lb 4.8 oz (66.361 kg). Her oral temperature is 98 F (36.7 C). Her blood pressure is 109/67 and her pulse is 72. Her respiration is 16. . The lungs are clear. The heart has a regular rhythm and rate. The abdomen is soft and nontender with normal bowel sounds. Impression . . . . . . . The patient is tolerating radiation. Plan . . . . . . . . . . . . Continue treatment as planned.  ________________________________   Blair Promise, PhD, MD

## 2015-05-05 ENCOUNTER — Ambulatory Visit
Admission: RE | Admit: 2015-05-05 | Discharge: 2015-05-05 | Disposition: A | Discharge status: Home / Self Care | Payer: 59 | Source: Ambulatory Visit | Attending: Radiation Oncology | Admitting: Radiation Oncology

## 2015-05-05 DIAGNOSIS — Z510 Encounter for antineoplastic radiation therapy: Secondary | ICD-10-CM | POA: Diagnosis not present

## 2015-05-06 ENCOUNTER — Ambulatory Visit
Admission: RE | Admit: 2015-05-06 | Discharge: 2015-05-06 | Disposition: A | Discharge status: Home / Self Care | Payer: 59 | Source: Ambulatory Visit | Attending: Radiation Oncology | Admitting: Radiation Oncology

## 2015-05-06 DIAGNOSIS — C772 Secondary and unspecified malignant neoplasm of intra-abdominal lymph nodes: Secondary | ICD-10-CM | POA: Diagnosis not present

## 2015-05-06 DIAGNOSIS — Z510 Encounter for antineoplastic radiation therapy: Secondary | ICD-10-CM | POA: Diagnosis not present

## 2015-05-07 ENCOUNTER — Ambulatory Visit
Admission: RE | Admit: 2015-05-07 | Discharge: 2015-05-07 | Disposition: A | Discharge status: Home / Self Care | Payer: 59 | Source: Ambulatory Visit | Attending: Radiation Oncology | Admitting: Radiation Oncology

## 2015-05-07 DIAGNOSIS — C772 Secondary and unspecified malignant neoplasm of intra-abdominal lymph nodes: Secondary | ICD-10-CM | POA: Diagnosis not present

## 2015-05-10 ENCOUNTER — Ambulatory Visit
Admission: RE | Admit: 2015-05-10 | Discharge: 2015-05-10 | Disposition: A | Discharge status: Home / Self Care | Payer: 59 | Source: Ambulatory Visit | Attending: Radiation Oncology | Admitting: Radiation Oncology

## 2015-05-10 DIAGNOSIS — C772 Secondary and unspecified malignant neoplasm of intra-abdominal lymph nodes: Secondary | ICD-10-CM | POA: Diagnosis not present

## 2015-05-11 ENCOUNTER — Ambulatory Visit
Admission: RE | Admit: 2015-05-11 | Discharge: 2015-05-11 | Disposition: A | Discharge status: Home / Self Care | Payer: 59 | Source: Ambulatory Visit | Attending: Radiation Oncology | Admitting: Radiation Oncology

## 2015-05-11 ENCOUNTER — Encounter: Payer: Self-pay | Admitting: Radiation Oncology

## 2015-05-11 VITALS — BP 123/72 | HR 71 | Temp 98.0°F | Resp 18 | Ht 63.0 in | Wt 144.9 lb

## 2015-05-11 DIAGNOSIS — C772 Secondary and unspecified malignant neoplasm of intra-abdominal lymph nodes: Secondary | ICD-10-CM | POA: Diagnosis not present

## 2015-05-11 NOTE — Progress Notes (Addendum)
Diane Lozano has completed 26 fractions to her abdomen.  She denies pain.  She reports occasional nausea but has not had to take antinausea medication.  She denies having any diarrhea and her last bowel movement was today.  She reports having fatigue.  She continues to work part time.  She does not have any skin changes on her abdomen.  She has been given a one month follow up card.  BP 123/72 mmHg  Pulse 71  Temp(Src) 98 F (36.7 C) (Oral)  Resp 18  Ht 5\' 3"  (1.6 m)  Wt 144 lb 14.4 oz (65.726 kg)  BMI 25.67 kg/m2

## 2015-05-11 NOTE — Progress Notes (Signed)
  Radiation Oncology         [phone] ________________________________  Name: Diane Lozano MRN: [id number]  Date: 05/11/2015  DOB: 19-May-1956  Weekly Radiation Therapy Management    ICD-9-CM ICD-10-CM   1. 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 . . . . . . . The patient presents for routine under treatment assessment.                                   The patient had some mild nausea. She denies any problems with diarrhea. She does have some fatigue but continues to work full-time                                 Set-up films were reviewed.                                 The chart was checked. Physical Findings. . .  height is 5\' 3"  (1.6 m) and weight is 144 lb 14.4 oz (65.726 kg). Her oral temperature is 98 F (36.7 C). Her blood pressure is 123/72 and her pulse is 71. Her respiration is 18. . Weight essentially stable. The lungs are clear. The heart has a regular rhythm and rate. The abdomen is soft and nontender with normal bowel sounds. Impression . . . . . . . The patient is tolerating radiation. Plan . . . . . . . . . . . . Continue treatment as planned.  ________________________________   Blair Promise, PhD, MD

## 2015-05-12 ENCOUNTER — Ambulatory Visit
Admission: RE | Admit: 2015-05-12 | Discharge: 2015-05-12 | Disposition: A | Discharge status: Home / Self Care | Payer: 59 | Source: Ambulatory Visit | Attending: Radiation Oncology | Admitting: Radiation Oncology

## 2015-05-12 DIAGNOSIS — C772 Secondary and unspecified malignant neoplasm of intra-abdominal lymph nodes: Secondary | ICD-10-CM | POA: Diagnosis not present

## 2015-05-13 ENCOUNTER — Ambulatory Visit
Admission: RE | Admit: 2015-05-13 | Discharge: 2015-05-13 | Disposition: A | Discharge status: Home / Self Care | Payer: 59 | Source: Ambulatory Visit | Attending: Radiation Oncology | Admitting: Radiation Oncology

## 2015-05-13 ENCOUNTER — Ambulatory Visit: Payer: 59

## 2015-05-13 DIAGNOSIS — C772 Secondary and unspecified malignant neoplasm of intra-abdominal lymph nodes: Secondary | ICD-10-CM | POA: Diagnosis not present

## 2015-05-14 ENCOUNTER — Ambulatory Visit: Payer: 59

## 2015-05-21 ENCOUNTER — Other Ambulatory Visit: Payer: Self-pay

## 2015-05-21 DIAGNOSIS — C541 Malignant neoplasm of endometrium: Secondary | ICD-10-CM

## 2015-05-23 ENCOUNTER — Encounter: Payer: Self-pay | Admitting: Radiation Oncology

## 2015-05-23 NOTE — Progress Notes (Signed)
  Radiation Oncology         [phone] ________________________________  Name: Diane Lozano MRN: [id number]  Date: 05/23/2015  DOB: [birth year]  End of Treatment Note  Diagnosis:  Recurrent Endometrial cancer (high-grade serous adenocarcinoma) with metastasis to the retroperitoneal lymph nodes      Indication for treatment:  Isolated recurrence in this region, definitive treatment       Radiation treatment dates:   October 31 through December 8  Site/dose:   PET positive retroperitoneal nodes 50.4 gray in 28 fractions with simultaneous integrated boost to 56 gray  Beams/energy:   IMRT, helical, 6 megavolt photons  Narrative: The patient tolerated radiation treatment relatively well.   She experienced some mild nausea and mild fatigue but continued to work full-time.  Plan: The patient has completed radiation treatment. The patient will return to radiation oncology clinic for routine followup in one month. I advised them to call or return sooner if they have any questions or concerns related to their recovery or treatment.  -----------------------------------  Blair Promise, PhD, MD

## 2015-06-07 ENCOUNTER — Other Ambulatory Visit: Payer: Self-pay | Admitting: Oncology

## 2015-06-17 ENCOUNTER — Encounter (HOSPITAL_COMMUNITY): Payer: Self-pay

## 2015-06-17 ENCOUNTER — Other Ambulatory Visit (HOSPITAL_BASED_OUTPATIENT_CLINIC_OR_DEPARTMENT_OTHER): Payer: 59

## 2015-06-17 ENCOUNTER — Telehealth: Payer: Self-pay

## 2015-06-17 ENCOUNTER — Ambulatory Visit (HOSPITAL_COMMUNITY)
Admission: RE | Admit: 2015-06-17 | Discharge: 2015-06-17 | Disposition: A | Discharge status: Home / Self Care | Payer: 59 | Source: Ambulatory Visit | Attending: Oncology | Admitting: Oncology

## 2015-06-17 DIAGNOSIS — C541 Malignant neoplasm of endometrium: Secondary | ICD-10-CM | POA: Insufficient documentation

## 2015-06-17 DIAGNOSIS — I7090 Unspecified atherosclerosis: Secondary | ICD-10-CM | POA: Diagnosis not present

## 2015-06-17 DIAGNOSIS — C772 Secondary and unspecified malignant neoplasm of intra-abdominal lymph nodes: Secondary | ICD-10-CM

## 2015-06-17 DIAGNOSIS — C7989 Secondary malignant neoplasm of other specified sites: Secondary | ICD-10-CM

## 2015-06-17 LAB — COMPREHENSIVE METABOLIC PANEL
ALT: 20 U/L (ref 0–55)
AST: 25 U/L (ref 5–34)
Albumin: 4.1 g/dL (ref 3.5–5.0)
Alkaline Phosphatase: 58 U/L (ref 40–150)
Anion Gap: 11 mEq/L (ref 3–11)
BUN: 15 mg/dL (ref 7.0–26.0)
CO2: 22 mEq/L (ref 22–29)
Calcium: 9.6 mg/dL (ref 8.4–10.4)
Chloride: 107 mEq/L (ref 98–109)
Creatinine: 0.8 mg/dL (ref 0.6–1.1)
EGFR: 84 mL/min/{1.73_m2} — ABNORMAL LOW (ref >90)
Glucose: 94 mg/dl (ref 70–140)
Potassium: 3.7 mEq/L (ref 3.5–5.1)
Sodium: 140 mEq/L (ref 136–145)
Total Bilirubin: 0.7 mg/dL (ref 0.20–1.20)
Total Protein: 7.6 g/dL (ref 6.4–8.3)

## 2015-06-17 LAB — CBC WITH DIFFERENTIAL/PLATELET
BASO%: 0.7 % (ref 0.0–2.0)
Basophils Absolute: 0 10*3/uL (ref 0.0–0.1)
EOS%: 8.1 % — ABNORMAL HIGH (ref 0.0–7.0)
Eosinophils Absolute: 0.2 10*3/uL (ref 0.0–0.5)
HCT: 36.5 % (ref 34.8–46.6)
HGB: 12.5 g/dL (ref 11.6–15.9)
LYMPH%: 10.5 % — ABNORMAL LOW (ref 14.0–49.7)
MCH: 31.8 pg (ref 25.1–34.0)
MCHC: 34.2 g/dL (ref 31.5–36.0)
MCV: 92.8 fL (ref 79.5–101.0)
MONO#: 0.2 10*3/uL (ref 0.1–0.9)
MONO%: 7.6 % (ref 0.0–14.0)
NEUT#: 1.7 10*3/uL (ref 1.5–6.5)
NEUT%: 73.1 % (ref 38.4–76.8)
Platelets: 143 10*3/uL — ABNORMAL LOW (ref 145–400)
RBC: 3.93 10*6/uL (ref 3.70–5.45)
RDW: 12.9 % (ref 11.2–14.5)
WBC: 2.4 10*3/uL — ABNORMAL LOW (ref 3.9–10.3)
lymph#: 0.3 10*3/uL — ABNORMAL LOW (ref 0.9–3.3)

## 2015-06-17 MED ORDER — IOHEXOL 300 MG/ML  SOLN
50.0000 mL | Freq: Once | INTRAMUSCULAR | Status: AC | PRN
  Administered 2015-06-17: 50 mL via ORAL

## 2015-06-17 MED ORDER — IOHEXOL 300 MG/ML  SOLN
100.0000 mL | Freq: Once | INTRAMUSCULAR | Status: AC | PRN
  Administered 2015-06-17: 100 mL via INTRAVENOUS

## 2015-06-17 NOTE — Telephone Encounter (Signed)
-----   Message from [name], MD sent at 06/07/2015  2:44 PM EST ----- When report of CT AP on 1-12 is available, please send it + RT documentation note from Dr Sondra Come 12-18 to Dr Genia Del.  Lexine Baton can help from HIM, I'm sure  Please note in EMR when sent so I will know when I see patient on 1-19  thanks

## 2015-06-17 NOTE — Telephone Encounter (Signed)
Faxed CT report and End of treatment note as requested below by Dr. Marko Plume to Dr. Polly Cobia.

## 2015-06-18 ENCOUNTER — Telehealth: Payer: Self-pay | Admitting: Oncology

## 2015-06-18 LAB — CANCER ANTIGEN 125 (PARALLEL TESTING): CA 125: 33 U/mL (ref <35)

## 2015-06-18 LAB — CA 125: Cancer Antigen (CA) 125: 24.4 U/mL (ref 0.0–38.1)

## 2015-06-18 NOTE — Telephone Encounter (Signed)
Faxed records to Dr. Polly Cobia [phone] release id)

## 2015-06-21 MED FILL — VENLAFAXINE HCL ER 75 MG CA: 75 | 90 days supply | Qty: 90 | Fill #1

## 2015-06-23 ENCOUNTER — Encounter: Payer: Self-pay | Admitting: Oncology

## 2015-06-24 ENCOUNTER — Ambulatory Visit
Admission: RE | Admit: 2015-06-24 | Discharge: 2015-06-24 | Disposition: A | Discharge status: Home / Self Care | Payer: 59 | Source: Ambulatory Visit | Attending: Radiation Oncology | Admitting: Radiation Oncology

## 2015-06-24 ENCOUNTER — Encounter: Payer: Self-pay | Admitting: Radiation Oncology

## 2015-06-24 ENCOUNTER — Other Ambulatory Visit: Payer: 59

## 2015-06-24 ENCOUNTER — Ambulatory Visit (HOSPITAL_BASED_OUTPATIENT_CLINIC_OR_DEPARTMENT_OTHER): Payer: 59 | Admitting: Oncology

## 2015-06-24 ENCOUNTER — Encounter: Payer: Self-pay | Admitting: Oncology

## 2015-06-24 VITALS — BP 118/64 | HR 73 | Temp 98.2°F | Resp 17 | Ht 63.0 in | Wt 145.9 lb

## 2015-06-24 VITALS — BP 124/68 | HR 72 | Temp 98.3°F | Ht 63.0 in | Wt 146.5 lb

## 2015-06-24 DIAGNOSIS — C772 Secondary and unspecified malignant neoplasm of intra-abdominal lymph nodes: Secondary | ICD-10-CM

## 2015-06-24 DIAGNOSIS — C7989 Secondary malignant neoplasm of other specified sites: Secondary | ICD-10-CM | POA: Diagnosis not present

## 2015-06-24 DIAGNOSIS — D72819 Decreased white blood cell count, unspecified: Secondary | ICD-10-CM

## 2015-06-24 DIAGNOSIS — C541 Malignant neoplasm of endometrium: Secondary | ICD-10-CM | POA: Diagnosis not present

## 2015-06-24 DIAGNOSIS — D701 Agranulocytosis secondary to cancer chemotherapy: Secondary | ICD-10-CM

## 2015-06-24 DIAGNOSIS — T451X5A Adverse effect of antineoplastic and immunosuppressive drugs, initial encounter: Secondary | ICD-10-CM

## 2015-06-24 NOTE — Progress Notes (Signed)
Radiation Oncology         [phone]) [phone] ________________________________  Name: Diane Lozano MRN: [id number]  Date: 06/24/2015  DOB: 10/29/1955  Follow-Up Visit Note  CC: [name], MD  [name], MD  No diagnosis found.  Diagnosis: Recurrent Endometrial cancer (high-grade serous adenocarcinoma) with metastasis to the retroperitoneal lymph nodes      Interval Since Last Radiation: 6 weeks   October 31 through May 13, 2015: PET positive retroperitoneal nodes 50.4 gray in 28 fractions with simultaneous integrated boost to 56 gray  May 27, June 5,  June 11, June 19, November 27, 2012: Proximal vagina 30 Gy in 5 fractions  Narrative:  The patient returns today for routine follow-up. She had a CT scan of the abdomen and pelvis on 06/17/15. It shows no signs of disease. She denies pain and bladder issues.She reports vomiting and diarrhea last week.She thinks it was from a virus/food poisoning and is feeling better now.She reports having a good appetite.She reports her energy level is good. She saw Dr. Marko Plume this morning.  ALLERGIES:  has No Known Allergies.  Meds: Current Outpatient Prescriptions  Medication Sig Dispense Refill  . Calcium Carb-Cholecalciferol 500-600 MG-UNIT TABS Take 1 tablet by mouth daily.    . simvastatin (ZOCOR) 40 MG tablet Take 40 mg by mouth every evening.    . venlafaxine XR (EFFEXOR-XR) 75 MG 24 hr capsule Take 75 mg by mouth daily.    Marland Kitchen LORazepam (ATIVAN) 1 MG tablet Place 1/2 -1 tablet under the tongue or swallow every 6 hrs as needed for nausea.  Will make drowsy. (Patient not taking: Reported on 06/24/2015) 30 tablet 1  . ondansetron (ZOFRAN) 8 MG tablet Take 1 tablet (8 mg total) by mouth every 8 (eight) hours as needed for nausea or vomiting. (Patient not taking: Reported on 06/24/2015) 30 tablet 2  . prochlorperazine (COMPAZINE) 10 MG tablet Take 1 tablet (10 mg total) by mouth every 6 (six) hours as needed for nausea or vomiting.  (Patient not taking: Reported on 06/24/2015) 30 tablet 1   No current facility-administered medications for this encounter.    Physical Findings: The patient is in no acute distress. Patient is alert and oriented.  height is 5\' 3"  (1.6 m) and weight is 146 lb 8 oz (66.452 kg). Her oral temperature is 98.3 F (36.8 C). Her blood pressure is 124/68 and her pulse is 72.  Lungs are clear to auscultation bilaterally. Heart has regular rate and rhythm. No palpable cervical, supraclavicular, or axillary adenopathy. Abdomen soft, non-tender.  Lab Findings: Lab Results  Component Value Date   WBC 2.4* 06/17/2015   HGB 12.5 06/17/2015   HCT 36.5 06/17/2015   MCV 92.8 06/17/2015   PLT 143* 06/17/2015    Radiographic Findings: Ct Abdomen Pelvis W Contrast  06/17/2015  CLINICAL DATA:  Endometrial cancer diagnosed 2/14. Chemotherapy and radiation therapy complete. Hysterectomy. No current complaints. Nodal metastasis. Restaging. EXAM: CT ABDOMEN AND PELVIS WITH CONTRAST TECHNIQUE: Multidetector CT imaging of the abdomen and pelvis was performed using the standard protocol following bolus administration of intravenous contrast. CONTRAST:  60mL OMNIPAQUE IOHEXOL 300 MG/ML SOLN, 129mL OMNIPAQUE IOHEXOL 300 MG/ML SOLN COMPARISON:  PET of 03/01/2015.  Most recent CT of 10/12/2014. FINDINGS: Lower chest: Clear lung bases. Normal heart size without pericardial or pleural effusion. Hepatobiliary: Normal liver. Normal gallbladder, without biliary ductal dilatation. Pancreas: Normal, without mass or ductal dilatation. Spleen: Normal in size, without focal abnormality. Adrenals/Urinary Tract: Normal adrenal glands. Normal kidneys,  without hydronephrosis. Normal urinary bladder. Stomach/Bowel: Underdistention of the gastric fundus and antrum. Colonic stool burden suggests constipation. Normal terminal ileum and appendix. Normal small bowel. Vascular/Lymphatic: Aortic and branch vessel atherosclerosis. The previously  described retroperitoneal adenopathy has resolved. The small left periaortic node on the prior PET is no longer identified. No pelvic adenopathy. Reproductive: Hysterectomy.  No adnexal mass. Other: No significant free fluid. No evidence of omental or peritoneal disease. Musculoskeletal: Degenerative sclerosis of the left sacroiliac joint. IMPRESSION: 1. No acute process or evidence of metastatic disease in the abdomen or pelvis. Resolution of previously described retroperitoneal adenopathy. 2.  Possible constipation. 3. Atherosclerosis. Electronically Signed   By: Abigail Miyamoto M.D.   On: 06/17/2015 12:04    Impression:  The patient is recovering from the effects of radiation.  Recent CT scan shows no active disease in the abdomen or pelvis.  Plan: The patient will follow up with me in 3 months. Dr. Marko Plume will discuss management issues with Dr. Polly Cobia. ____________________________________  Blair Promise, PhD, MD  This document serves as a record of services personally performed by Gery Pray, MD. It was created on his behalf by Darcus Austin, a trained medical scribe. The creation of this record is based on the scribe's personal observations and the provider's statements to them. This document has been checked and approved by the attending provider.

## 2015-06-24 NOTE — Progress Notes (Signed)
OFFICE PROGRESS NOTE   June 24, 2015   Physicians:Elizabeth Polly Cobia, MD, Gery Pray MD, [name] MD  INTERVAL HISTORY:  Patient is seen, alone for visit, in continuing attention to recurrent endometrial carcinoma. Last chemotherapy was dose dense carbo taxol from 11-27-14 thru 11-10-59, complicated by significant cytopenias despite gCSF. She had PR with the chemotherapy, then received IMRT to retroperitoneal nodes 10-31 thru 05-13-15. She had restaging CT AP 06-17-15, which does not show apparent residual or progressive disease.   Patient has felt well in last several weeks other than 3 days of possible gastroenteritis last week, with vomiting initially then slightly loose stools x 2 days. The GI symptoms have resolved entirely. Energy and appetite are good. She denies abdominal, pelvic or back pain. She has no SOB or other respiratory symptoms, no bleeding, no fever or other symptoms of infection, bladder ok, no swelling LE.  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  No central catheter.  No genetics testing CA 125 132 on 11-27-14 (day 1 cycle 1)  Her children are taking her to [place] in May.  ONCOLOGIC HISTORY Patient presented to PCP with intermittent vaginal bleeding since ~ Oct 2013, endometrial biopsy 06-20-12 with complex endometrial hyperplasia with atypia. Pelvic and transvaginal US in [place] system on 06-26-12 showed endometrial thickening of 2.7 cm, right ovary not clearly seen and left ovary with normal postmenopausal appearance. She was seen by Dr Christophe Louis and referred to Dr Polly Cobia. She had robotic hysterectomy with BSO and bilateral pelvic and periaortic node dissection by Dr Polly Cobia at [facility] on 07-30-2012. Intraoperatively she had large endometrial polyp with high grade malignancy, no obvious extrauterine disease. Pathology ([facility]  [place] [id number] from 07-30-2012) had high grade, poorly differentiated endometrial adenocarcinoma with solid and serous papillary components, 2 periaortic nodes negative and 9 right pelvic/11 left pelvic nodes negative. The tumor was 4.8 cm confined to polyp with no definite myometrial involvement, margins clear and no angiolymphatic invasion identified.  Cycle 1 taxol carboplatin was given 08-30-12, 6 cycles of q 3 week regimen given thru 12-13-2012, with gCSF support. Radiation was intracavitary HDR to proximal vagina 30 Gy in 5 fractions from May 27 thru November 27, 2012, by Dr Sondra Come. Patient was followed closely by Dr Polly Cobia after completing adjuvant taxol carboplatin chemotherapy in 12-2012. Patient felt very well, with intentional 70 lb weight loss over a year from Jan thru Dec 2015 on [employer]. She saw Dr Polly Cobia with unremarkable exam in March 2016, however CA 125 was slightly elevated at 36.3, up from previous 29.2. She had CT CAP with contrast 10-16-14 with new left periaortic and left common iliac lymphadenopathy. PET 9-50-93 had hypermetabolic uptake in retroperitoneal nodes from lower poles of kidneys to aortic bifurcation,largest 2.0 x 2.1 cm, as well as uptake in either right supraclavicular node vs right lobe of thyroid (report below). FNA of thyroid 10-28-14 with benign tissue, [place] at time of biopsy reportedly showing no right supraclavicular adenopathy. She had CT needle biopsy of left para-aortic node 11-16-14 with path showing serous carcinoma. She began chemotherapy with dose dense carbo taxol on 11-27-14, with carbo at AUC = 5. Over next 3 weeks WBC and platelets were too low for treatments, despite total 7 doses of granix. She had taxol only cycle 2, then [name] at AUC=2 + taxol cycle 3. Counts maintained with increase in gCSF to 480 mcg 3-4 doses after each  treatment, including increase in [name] for third dose to AUC =3 on 02-11-15. PET 03-01-15 had uptake in one left peri-aortic  node and the area of previous benign biopsy right lobe thyroid. She received IMRT, summary as follows:  Diagnosis: Recurrent Endometrial cancer (high-grade serous adenocarcinoma) with metastasis to the retroperitoneal lymph nodes  Indication for treatment: Isolated recurrence in this region, definitive treatment  Radiation treatment dates: October 31 through December 8 Site/dose: PET positive retroperitoneal nodes 50.4 gray in 28 fractions with simultaneous integrated boost to 56 gray CT AP 06-17-15 showed no apparent residual or progressive disease.  Objective:  Vital signs in last 24 hours:  BP 118/64 mmHg  Pulse 73  Temp(Src) 98.2 F (36.8 C) (Oral)  Resp 17  Ht _0  (1.6 m)  Wt 145 lb 14.4 oz (66.18 kg)  BMI 25.85 kg/m2  SpO2 100% Weight up 1 lb Alert, oriented and appropriate. Ambulatory without difficulty.   HEENT:PERRL, sclerae not icteric. Oral mucosa moist without lesions, posterior pharynx clear.  Neck supple. No JVD.  Lymphatics:no cervical,supraclavicular, axillary or inguinal adenopathy Resp: clear to auscultation bilaterally and normal percussion bilaterally Cardio: regular rate and rhythm. No gallop. GI: soft, nontender, not distended, no mass or organomegaly. Normally active bowel sounds. Surgical incision not remarkable. Musculoskeletal/ Extremities: Kyphosis as baseline. LE without pitting edema, cords, tenderness Neuro: slight peripheral neuropathy fingers and feet. Otherwise nonfocal Skin without rash, ecchymosis, petechiae   Lab Results: LABS DRAWN 06-17-15 Results for orders placed or performed in visit on 06/17/15  CBC with Differential  Result Value Ref Range   WBC 2.4 (L) 3.9 - 10.3 10e3/uL   NEUT# 1.7 1.5 - 6.5 10e3/uL   HGB 12.5 11.6 - 15.9 g/dL   HCT 36.5 34.8 - 46.6 %   Platelets 143 (L) 145 - 400 10e3/uL   MCV 92.8 79.5 - 101.0 fL   MCH 31.8 25.1 - 34.0 pg   MCHC 34.2 31.5 - 36.0 g/dL   RBC 3.93 3.70 - 5.45 10e6/uL   RDW 12.9  11.2 - 14.5 %   lymph# 0.3 (L) 0.9 - 3.3 10e3/uL   MONO# 0.2 0.1 - 0.9 10e3/uL   Eosinophils Absolute 0.2 0.0 - 0.5 10e3/uL   Basophils Absolute 0.0 0.0 - 0.1 10e3/uL   NEUT% 73.1 38.4 - 76.8 %   LYMPH% 10.5 (L) 14.0 - 49.7 %   MONO% 7.6 0.0 - 14.0 %   EOS% 8.1 (H) 0.0 - 7.0 %   BASO% 0.7 0.0 - 2.0 %  Comprehensive metabolic panel  Result Value Ref Range   Sodium 140 136 - 145 mEq/L   Potassium 3.7 3.5 - 5.1 mEq/L   Chloride 107 98 - 109 mEq/L   CO2 22 22 - 29 mEq/L   Glucose 94 70 - 140 mg/dl   BUN 15.0 7.0 - 26.0 mg/dL   Creatinine 0.8 0.6 - 1.1 mg/dL   Total Bilirubin 0.70 0.20 - 1.20 mg/dL   Alkaline Phosphatase 58 40 - 150 U/L   AST 25 5 - 34 U/L   ALT 20 0 - 55 U/L   Total Protein 7.6 6.4 - 8.3 g/dL   Albumin 4.1 3.5 - 5.0 g/dL   Calcium 9.6 8.4 - 10.4 mg/dL   Anion Gap 11 3 - 11 mEq/L   EGFR 84 (L) >90 ml/min/1.73 m2  CA 125  Result Value Ref Range   Cancer Antigen (CA) 125 24.4 0.0 - 38.1 U/mL  CA 125 (Parallel Testing)  Result Value Ref Range  CA 125 33 <35 U/mL   NOTE CA 125 value of 24.4 is by new Roche ECLIA methodology; value of 33 is using previous methodology. CA 125 by the previous methodology had been 17 on 03-11-15 and 13 on 02-07-15. NOTE gastroenteritis symptoms around time of these labs 06-17-15  Studies/Results: EXAM: CT ABDOMEN AND PELVIS WITH CONTRAST  TECHNIQUE: Multidetector CT imaging of the abdomen and pelvis was performed using the standard protocol following bolus administration of intravenous contrast.  CONTRAST: 44m OMNIPAQUE IOHEXOL 300 MG/ML SOLN, 1015mOMNIPAQUE IOHEXOL 300 MG/ML SOLN  COMPARISON: PET of 03/01/2015. Most recent CT of 10/12/2014.  FINDINGS: Lower chest: Clear lung bases. Normal heart size without pericardial or pleural effusion.  Hepatobiliary: Normal liver. Normal gallbladder, without biliary ductal dilatation.  Pancreas: Normal, without mass or ductal dilatation.  Spleen: Normal in size, without  focal abnormality.  Adrenals/Urinary Tract: Normal adrenal glands. Normal kidneys, without hydronephrosis. Normal urinary bladder.  Stomach/Bowel: Underdistention of the gastric fundus and antrum. Colonic stool burden suggests constipation. Normal terminal ileum and appendix. Normal small bowel.  Vascular/Lymphatic: Aortic and branch vessel atherosclerosis. The previously described retroperitoneal adenopathy has resolved. The small left periaortic node on the prior PET is no longer identified. No pelvic adenopathy.  Reproductive: Hysterectomy. No adnexal mass.  Other: No significant free fluid. No evidence of omental or peritoneal disease.  Musculoskeletal: Degenerative sclerosis of the left sacroiliac joint.  IMPRESSION: 1. No acute process or evidence of metastatic disease in the abdomen or pelvis. Resolution of previously described retroperitoneal adenopathy. 2. Possible constipation. 3. Atherosclerosis.  PACS images reviewed with patient at time of viist.  Medications: I have reviewed the patient's current medications.  DISCUSSION Patient understands that likelihood is that she will have further progression of this recurrent endometrial cancer at some point, however we are delighted that present CT looks so good almost 6 weeks from completion of radiation to retroperitoneal area. She is to see Dr KiSondra Comeater today, and has already discussed the CT by phone with him also (thank you!).  The CT report and RT end of treatment information have been faxed to Dr SkPolly Cobiathis note to her also and we will be glad for any further thoughts or recommendations from her. Patient is not presently scheduled back to Dr SkPolly Cobiatho she would be glad to see her if appropriate.  Unless Dr SKPolly Cobiar Dr KiSondra Comeeel differently now, will not do PET again yet. Will repeat CA 125 in ~ 6 weeks, NOTE gastroenteritis symptoms about time that lab was done.  If she needs further chemo  later, may do better with different schedule allowing neulasta.  I do not find ER PR information on the outside path available scanned in this EMR.  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 Even dose reduced carboplatin and weekly taxol with granix was difficult due to low counts; PET after initial chemo only uptake in a left periaortic node (+thyroid where previously biopsied). Completed IMRT to retroperitoneal nodes 05-13-15. CT AP 06-17-15 does not show apparent residual or progressive disease. Information to Dr SkPolly CobiaDr KiSondra Comeollowing. Will set up next appointments here after communication with Dr SkPolly Cobia2.Leukopenia without neutropenia now, and mild thrombocytopenia: likely residual from chemo and recent RT. No bleeding or present infectious symptoms, follow. See comment above re neulasta. NOTE EMEND did not worsen counts obviously. 3.intentional weight loss of 70 lbs  over the 2015 calendar year, trying to maintain now. Hypoglycemia intermittently by labs during treatment, ok today 4.chronic constipation, which was a concern during last chemo and noted on this CT. Up to date on colonoscopy.  5.BP good without antihypertensives now, follow. Likely improved with weight loss  6.elevated lipids 7.remote minimal tobacco, not enough to qualify for the lung cancer screening program 8.up to date mammograms 9.melanoma in brother. No concerning skin lesions seen on exam done today 10. some diffuse uptake right thyroid on PET, no specific nodule on [place] and FNA benign. TFTs WNL 6-24-160. She will discuss with PCP at next visit there. 11. flu vaccine 02-23-15 12.gastroenteritis symptoms last week, seem resolved. CA 125 drawn about that time.  All questions answered. Time spent 25 min including >50% counseling and  coordination of care. Cc Drs Polly Cobia, Verner Chol, MD   06/24/2015, 9:20 AM

## 2015-06-24 NOTE — Progress Notes (Signed)
Diane Lozano here for follow up.  She denies pain and bladder issues.  She reports having vomiting and diarrhea last week.  She thinks it was from a virus/food poisoning and is feeling better now.  She reports having a good appetite.  She reports her energy level is good.  She saw Dr. Marko Plume this morning.  BP 124/68 mmHg  Pulse 72  Temp(Src) 98.3 F (36.8 C) (Oral)  Ht 5\' 3"  (1.6 m)  Wt 146 lb 8 oz (66.452 kg)  BMI 25.96 kg/m2   Wt Readings from Last 3 Encounters:  06/24/15 146 lb 8 oz (66.452 kg)  06/24/15 145 lb 14.4 oz (66.18 kg)  05/11/15 144 lb 14.4 oz (65.726 kg)

## 2015-06-26 ENCOUNTER — Other Ambulatory Visit: Payer: Self-pay | Admitting: Oncology

## 2015-06-30 ENCOUNTER — Telehealth: Payer: Self-pay

## 2015-06-30 NOTE — Telephone Encounter (Signed)
-----   Message from [name], MD sent at 06/26/2015  7:55 AM EST ----- This message sent thru EMR to Dr Polly Cobia with my note of 06-24-15: "Completed RT 05-13-15. CT 06-17-15 looks good. Patient feeling well.  CA 125 a little higher, but had gastroenteritis symptoms then. Will recheck. She is glad for apt with you if suggested Please let me know if any recommendations other than close follow up for now Best contact #s for me:  My RN at [phone] or my pager [phone]"  RN please call Dr Shelba Flake nurse ~ 1-24 or 1-25. Be sure they have received CT report from 06-17-15, RT completion note from Dec and my note 06-24-15. Also send Dr Clabe Seal note from 1-19 (his note, not Karen's 1-19). Be sure they received my message as above.  Unless we hear differently, I will need to see patient with labs in ~ 6 weeks  CBC CMET CA125. (POF and orders not done yet) and need to let patient know  thanks

## 2015-06-30 NOTE — Telephone Encounter (Signed)
Lauren states that all the records were received except Dr. Clabe Seal note from 06-24-15 and the actual " In Basket message"  from Dr. Marko Plume. Faxed Dr. Clabe Seal note and the In-Basket message to [place]. [place] office numbers as Dr. Polly Cobia goes to the [place] office once a week. Lauren will discuss with Dr. Polly Cobia and contact Dr. Mariana Kaufman nurse with Dr. Shelba Flake follow up recommendations.

## 2015-07-06 ENCOUNTER — Other Ambulatory Visit: Payer: Self-pay | Admitting: Oncology

## 2015-07-06 NOTE — Telephone Encounter (Signed)
Spoke with Ms. Mick and told her about follow up with Dr. Polly Cobia.  She will call the [place] office to morrow and set up appointment. She will lat Dr. Mariana Kaufman office know appointment date.

## 2015-07-06 NOTE — Telephone Encounter (Signed)
Lauren called stating that Dr. Polly Cobia reviewed the records sent by Dr. Marko Plume. [name] can schedule an appointment with Dr. Polly Cobia at her [facility] office to discuss plan of care.  Nest available appointment is fine wit Dr. Polly Cobia.

## 2015-07-08 ENCOUNTER — Telehealth: Payer: Self-pay | Admitting: Oncology

## 2015-07-08 NOTE — Telephone Encounter (Signed)
Called and left a message with new appointments per pof °

## 2015-07-09 ENCOUNTER — Telehealth: Payer: Self-pay | Admitting: Oncology

## 2015-07-09 NOTE — Telephone Encounter (Signed)
Returned patient call re r/s lab/LL from 3/6 to 3/9. Patient given new appointment for lab/LL 3/9.

## 2015-07-22 DIAGNOSIS — F411 Generalized anxiety disorder: Secondary | ICD-10-CM | POA: Diagnosis not present

## 2015-07-22 DIAGNOSIS — E7800 Pure hypercholesterolemia, unspecified: Secondary | ICD-10-CM | POA: Diagnosis not present

## 2015-07-26 ENCOUNTER — Other Ambulatory Visit: Payer: Self-pay | Admitting: *Deleted

## 2015-07-26 NOTE — Patient Outreach (Signed)
07/26/15- Referral received from high cost [employer], telephone call to patient, spoke with patient, HIPAA verified, pt states she no longer takes blood pressure medication because " it's under such good control"  Pt states she utilizes [employer] and has all medications and takes as prescribed. Pt states she sees her primary MD regularly. Pt reports "I'm doing well" and states she has no needs for care management or resources .  Jacqlyn Larsen [facility], [place] Coordinator [phone]

## 2015-07-29 DIAGNOSIS — Z8544 Personal history of malignant neoplasm of other female genital organs: Secondary | ICD-10-CM | POA: Diagnosis not present

## 2015-07-29 DIAGNOSIS — Z8589 Personal history of malignant neoplasm of other organs and systems: Secondary | ICD-10-CM | POA: Diagnosis not present

## 2015-07-29 DIAGNOSIS — C541 Malignant neoplasm of endometrium: Secondary | ICD-10-CM | POA: Diagnosis not present

## 2015-07-29 DIAGNOSIS — Z90710 Acquired absence of both cervix and uterus: Secondary | ICD-10-CM | POA: Diagnosis not present

## 2015-07-29 DIAGNOSIS — Z90722 Acquired absence of ovaries, bilateral: Secondary | ICD-10-CM | POA: Diagnosis not present

## 2015-07-29 DIAGNOSIS — Z87891 Personal history of nicotine dependence: Secondary | ICD-10-CM | POA: Diagnosis not present

## 2015-07-29 DIAGNOSIS — Z08 Encounter for follow-up examination after completed treatment for malignant neoplasm: Secondary | ICD-10-CM | POA: Diagnosis not present

## 2015-08-08 ENCOUNTER — Other Ambulatory Visit: Payer: Self-pay | Admitting: Oncology

## 2015-08-08 DIAGNOSIS — C541 Malignant neoplasm of endometrium: Secondary | ICD-10-CM

## 2015-08-08 DIAGNOSIS — C7989 Secondary malignant neoplasm of other specified sites: Secondary | ICD-10-CM

## 2015-08-09 ENCOUNTER — Telehealth: Payer: Self-pay | Admitting: Oncology

## 2015-08-09 ENCOUNTER — Other Ambulatory Visit: Payer: 59

## 2015-08-09 ENCOUNTER — Ambulatory Visit: Payer: 59 | Admitting: Oncology

## 2015-08-09 NOTE — Telephone Encounter (Signed)
Faxed over records request for pt to Dr. Polly Cobia

## 2015-08-12 ENCOUNTER — Encounter: Payer: Self-pay | Admitting: Oncology

## 2015-08-12 ENCOUNTER — Other Ambulatory Visit (HOSPITAL_BASED_OUTPATIENT_CLINIC_OR_DEPARTMENT_OTHER): Payer: 59

## 2015-08-12 ENCOUNTER — Ambulatory Visit (HOSPITAL_BASED_OUTPATIENT_CLINIC_OR_DEPARTMENT_OTHER): Payer: 59 | Admitting: Oncology

## 2015-08-12 VITALS — BP 128/67 | HR 71 | Temp 98.0°F | Resp 18 | Ht 63.0 in | Wt 153.1 lb

## 2015-08-12 DIAGNOSIS — C772 Secondary and unspecified malignant neoplasm of intra-abdominal lymph nodes: Secondary | ICD-10-CM | POA: Diagnosis not present

## 2015-08-12 DIAGNOSIS — J069 Acute upper respiratory infection, unspecified: Secondary | ICD-10-CM | POA: Diagnosis not present

## 2015-08-12 DIAGNOSIS — C7989 Secondary malignant neoplasm of other specified sites: Secondary | ICD-10-CM | POA: Diagnosis not present

## 2015-08-12 DIAGNOSIS — C541 Malignant neoplasm of endometrium: Secondary | ICD-10-CM

## 2015-08-12 DIAGNOSIS — D72819 Decreased white blood cell count, unspecified: Secondary | ICD-10-CM

## 2015-08-12 LAB — CBC WITH DIFFERENTIAL/PLATELET
BASO%: 2 % (ref 0.0–2.0)
Basophils Absolute: 0.1 10*3/uL (ref 0.0–0.1)
EOS%: 8.5 % — ABNORMAL HIGH (ref 0.0–7.0)
Eosinophils Absolute: 0.2 10*3/uL (ref 0.0–0.5)
HCT: 35.4 % (ref 34.8–46.6)
HGB: 11.8 g/dL (ref 11.6–15.9)
LYMPH%: 24.3 % (ref 14.0–49.7)
MCH: 31.6 pg (ref 25.1–34.0)
MCHC: 33.2 g/dL (ref 31.5–36.0)
MCV: 95.2 fL (ref 79.5–101.0)
MONO#: 0.2 10*3/uL (ref 0.1–0.9)
MONO%: 7.3 % (ref 0.0–14.0)
NEUT#: 1.7 10*3/uL (ref 1.5–6.5)
NEUT%: 57.9 % (ref 38.4–76.8)
Platelets: 163 10*3/uL (ref 145–400)
RBC: 3.72 10*6/uL (ref 3.70–5.45)
RDW: 12.6 % (ref 11.2–14.5)
WBC: 2.9 10*3/uL — ABNORMAL LOW (ref 3.9–10.3)
lymph#: 0.7 10*3/uL — ABNORMAL LOW (ref 0.9–3.3)

## 2015-08-12 LAB — COMPREHENSIVE METABOLIC PANEL
ALT: 12 U/L (ref 0–55)
AST: 17 U/L (ref 5–34)
Albumin: 3.7 g/dL (ref 3.5–5.0)
Alkaline Phosphatase: 63 U/L (ref 40–150)
Anion Gap: 9 mEq/L (ref 3–11)
BUN: 18.8 mg/dL (ref 7.0–26.0)
CO2: 25 mEq/L (ref 22–29)
Calcium: 9.4 mg/dL (ref 8.4–10.4)
Chloride: 107 mEq/L (ref 98–109)
Creatinine: 0.8 mg/dL (ref 0.6–1.1)
EGFR: 80 mL/min/{1.73_m2} — ABNORMAL LOW (ref >90)
Glucose: 101 mg/dl (ref 70–140)
Potassium: 3.8 mEq/L (ref 3.5–5.1)
Sodium: 141 mEq/L (ref 136–145)
Total Bilirubin: <0.3 mg/dL (ref 0.20–1.20)
Total Protein: 7 g/dL (ref 6.4–8.3)

## 2015-08-12 NOTE — Progress Notes (Signed)
OFFICE PROGRESS NOTE   August 14, 2015   Physicians:Elizabeth Skinner, MD, Gery Pray MD, [name] MD  INTERVAL HISTORY:   Patient is seen, alone for visit, on observation for recurrent endometrial carcinoma since treated with carbo taxol thru 02-11-15 (poorly tolerated with cytopenias)  then radiation to retroperitoneal nodes thru 05-13-15. Last imaging was CT AP 06-17-15. She saw Dr Sondra Come in 06-2015 and will see him again in 09-2015; she saw Dr Polly Cobia 07-29-15 (that note in [facility] system under [place]) and will see her again in 3 months.   Patient has felt very well other than viral URI for past week, improving. She has had no fever and no lower respiratory symptoms with this. Peripheral neuropathy from chemotherapy has essentially resolved. Appetite and energy are good, bowels moving well, no LE swelling, no abdominal or pelvic pain, no bleeding, bladder fine.  Remainder of 10 point Review of Systems negative.  No central catheter.  No genetics testing CA 125 132 on 11-27-14 (day 1 cycle 1)  Her children are taking her to [place] in May.  ONCOLOGIC HISTORY Patient presented to PCP with intermittent vaginal bleeding since ~ Oct 2013, endometrial biopsy 06-20-12 with complex endometrial hyperplasia with atypia. Pelvic and transvaginal US in [facility] system on 06-26-12 showed endometrial thickening of 2.7 cm, right ovary not clearly seen and left ovary with normal postmenopausal appearance. She was seen by Dr Christophe Louis and referred to Dr Polly Cobia. She had robotic hysterectomy with BSO and bilateral pelvic and periaortic node dissection by Dr Polly Cobia at [facility] on 07-30-2012. Intraoperatively she had large endometrial polyp with high grade malignancy, no obvious extrauterine disease. Pathology ([facility] [place] [id number] from 07-30-2012) had high grade, poorly differentiated endometrial adenocarcinoma with solid and serous papillary components, 2  periaortic nodes negative and 9 right pelvic/11 left pelvic nodes negative. The tumor was 4.8 cm confined to polyp with no definite myometrial involvement, margins clear and no angiolymphatic invasion identified.  Cycle 1 taxol carboplatin was given 08-30-12, 6 cycles of q 3 week regimen given thru 12-13-2012, with gCSF support. Radiation was intracavitary HDR to proximal vagina 30 Gy in 5 fractions from May 27 thru November 27, 2012, by Dr Sondra Come. Patient was followed closely by Dr Polly Cobia after completing adjuvant taxol carboplatin chemotherapy in 12-2012. Patient felt very well, with intentional 70 lb weight loss over a year from Jan thru Dec 2015 on [employer]. She saw Dr Polly Cobia with unremarkable exam in March 2016, however CA 125 was slightly elevated at 36.3, up from previous 29.2. She had CT CAP with contrast 10-16-14 with new left periaortic and left common iliac lymphadenopathy. PET 0-92-33 had hypermetabolic uptake in retroperitoneal nodes from lower poles of kidneys to aortic bifurcation,largest 2.0 x 2.1 cm, as well as uptake in either right supraclavicular node vs right lobe of thyroid (report below). FNA of thyroid 10-28-14 with benign tissue, [place] at time of biopsy reportedly showing no right supraclavicular adenopathy. She had CT needle biopsy of left para-aortic node 11-16-14 with path showing serous carcinoma. She began chemotherapy with dose dense carbo taxol on 11-27-14, with carbo at AUC = 5. Over next 3 weeks WBC and platelets were too low for treatments, despite total 7 doses of granix. She had taxol only cycle 2, then [name] at AUC=2 + taxol cycle 3. Counts maintained with increase in gCSF to 480 mcg 3-4 doses after each treatment, including increase in [name] for third dose to AUC =3 on 02-11-15. PET 03-01-15  had uptake in one left peri-aortic node and the area of previous benign biopsy right lobe thyroid. She received IMRT, summary as follows:  Diagnosis: Recurrent Endometrial cancer  (high-grade serous adenocarcinoma) with metastasis to the retroperitoneal lymph nodes  Indication for treatment: Isolated recurrence in this region, definitive treatment  Radiation treatment dates: October 31 through December 8 Site/dose: PET positive retroperitoneal nodes 50.4 gray in 28 fractions with simultaneous integrated boost to 56 gray CT AP 06-17-15 showed no apparent residual or progressive disease.   Objective:  Vital signs in last 24 hours:  BP 128/67 mmHg  Pulse 71  Temp(Src) 98 F (36.7 C) (Oral)  Resp 18  Ht 5' 3"  (1.6 m)  Wt 153 lb 1.6 oz (69.446 kg)  BMI 27.13 kg/m2  SpO2 99% Weight up 7 lbs Alert, oriented and appropriate. Ambulatory without difficulty, easily mobile.  No alopecia  HEENT:PERRL, sclerae not icteric. Oral mucosa moist without lesions, posterior pharynx clear. A little nasally congested. Neck supple. No JVD.  Lymphatics:no cervical,supraclavicular, axillary or inguinal adenopathy Resp: clear to auscultation bilaterally and normal percussion bilaterally Cardio: regular rate and rhythm. No gallop. GI: soft, nontender, not distended, no mass or organomegaly. Normally active bowel sounds. Surgical incision not remarkable. Musculoskeletal/ Extremities: without pitting edema, cords, tenderness. Mild kyphosis unchanged. Neuro: no peripheral neuropathy. Otherwise nonfocal Skin without rash, ecchymosis, petechiae   Lab Results:  Results for orders placed or performed in visit on 08/12/15  CBC with Differential  Result Value Ref Range   WBC 2.9 (L) 3.9 - 10.3 10e3/uL   NEUT# 1.7 1.5 - 6.5 10e3/uL   HGB 11.8 11.6 - 15.9 g/dL   HCT 35.4 34.8 - 46.6 %   Platelets 163 145 - 400 10e3/uL   MCV 95.2 79.5 - 101.0 fL   MCH 31.6 25.1 - 34.0 pg   MCHC 33.2 31.5 - 36.0 g/dL   RBC 3.72 3.70 - 5.45 10e6/uL   RDW 12.6 11.2 - 14.5 %   lymph# 0.7 (L) 0.9 - 3.3 10e3/uL   MONO# 0.2 0.1 - 0.9 10e3/uL   Eosinophils Absolute 0.2 0.0 - 0.5 10e3/uL    Basophils Absolute 0.1 0.0 - 0.1 10e3/uL   NEUT% 57.9 38.4 - 76.8 %   LYMPH% 24.3 14.0 - 49.7 %   MONO% 7.3 0.0 - 14.0 %   EOS% 8.5 (H) 0.0 - 7.0 %   BASO% 2.0 0.0 - 2.0 %  Comprehensive metabolic panel  Result Value Ref Range   Sodium 141 136 - 145 mEq/L   Potassium 3.8 3.5 - 5.1 mEq/L   Chloride 107 98 - 109 mEq/L   CO2 25 22 - 29 mEq/L   Glucose 101 70 - 140 mg/dl   BUN 18.8 7.0 - 26.0 mg/dL   Creatinine 0.8 0.6 - 1.1 mg/dL   Total Bilirubin <0.30 0.20 - 1.20 mg/dL   Alkaline Phosphatase 63 40 - 150 U/L   AST 17 5 - 34 U/L   ALT 12 0 - 55 U/L   Total Protein 7.0 6.4 - 8.3 g/dL   Albumin 3.7 3.5 - 5.0 g/dL   Calcium 9.4 8.4 - 10.4 mg/dL   Anion Gap 9 3 - 11 mEq/L   EGFR 80 (L) >90 ml/min/1.73 m2  CA 125  Result Value Ref Range   Cancer Antigen (CA) 125 37.5 0.0 - 38.1 U/mL  CA 125 (Parallel Testing)  Result Value Ref Range   CA 125 52 (H) <35 U/mL    CA 125 from 06-17-15 :  by new lab method was 24.4, this corresponding to 37.5 now,  and 33 on 06-17-15 by previous method corresponding to "parallel testing" 52 above. Also be previous method, this was 17 in 03-2015 and 13 in 02-2015.  Studies/Results:  No results found.  Medications: I have reviewed the patient's current medications.  DISCUSSION Clinically doing very well, however CA 125 results higher after visit. I will let Dr Polly Cobia know and will discuss with patient.  PET may be most helpful now. She is <= 6 months from last carbo taxol, which was complicated by marked cytopenias despite gCSF and dose reductions.  She may tolerate doxil more easily from standpoint of blood counts, or single agent avastin.  I do not see ER PR reported on surgical path from [facility] system 07-2012.  She would need PAC and echocardiogram if doxil.    Assessment/Plan:  1.recurrent high grade serous endometrial carcinoma involving para-aortic and common iliac nodes: ~ 22 months from completion of adjuvant chemotherapy for IA poorly  differentiated endometrial adenocarcinoma. Even dose reduced carboplatin and weekly taxol with granix was difficult due to low counts; PET after initial chemo only uptake in a left periaortic node (+thyroid where previously biopsied). Completed IMRT to retroperitoneal nodes 05-13-15. CT AP 06-17-15 does not show apparent residual or progressive disease. CA 125 increasing, consider PET and other systemic treatment. 2.Leukopenia without neutropenia now, with previous mild thrombocytopenia resolved. NOTE EMEND did not worsen counts obviously.  3.intentional weight loss of 70 lbs over the 2015 calendar year, trying to maintain now. Hypoglycemia intermittently by labs during treatment, ok today 4.chronic constipation, which was a concern during chemotherapy. Up to date on colonoscopy. 5.BP good without antihypertensives now, follow. Likely improved with weight loss  6.elevated lipids 7.remote minimal tobacco, not enough to qualify for the lung cancer screening program 8.up to date mammograms 9.melanoma in brother 35. some diffuse uptake right thyroid on PET, no specific nodule on [place] and FNA benign. TFTs WNL 6-24-160. She will discuss with PCP at next visit there. 11. flu vaccine 02-23-15 12,viral URI resolving. No indication for antibiotics  Will speak with patient by phone re marker and next steps. Time spent 25 min including >50% counseling and coordination of care. CC Drs Polly Cobia, Verner Chol, MD   08/14/2015, 1:28 PM

## 2015-08-13 ENCOUNTER — Telehealth: Payer: Self-pay | Admitting: Oncology

## 2015-08-13 LAB — CANCER ANTIGEN 125 (PARALLEL TESTING): CA 125: 52 U/mL — ABNORMAL HIGH (ref <35)

## 2015-08-13 LAB — CA 125: Cancer Antigen (CA) 125: 37.5 U/mL (ref 0.0–38.1)

## 2015-08-13 NOTE — Telephone Encounter (Signed)
lvm for pt regarding to JUNE appt.... °

## 2015-08-17 ENCOUNTER — Telehealth: Payer: Self-pay | Admitting: Oncology

## 2015-08-17 ENCOUNTER — Telehealth: Payer: Self-pay

## 2015-08-17 DIAGNOSIS — C541 Malignant neoplasm of endometrium: Secondary | ICD-10-CM

## 2015-08-17 DIAGNOSIS — C7989 Secondary malignant neoplasm of other specified sites: Secondary | ICD-10-CM

## 2015-08-17 NOTE — Telephone Encounter (Signed)
MEDICAL ONCOLOGY  LM for patient on both cell and home #s, asking her to call back to [phone] to speak with RN.  If RN speaks with her, need to let her know that CA 125 marker did come back a little more elevated. Even tho she is feeling very well, I would like to evaluate with PET in next few weeks.  Please also tell her that I have LM for Dr Genia Del, who is out of office until later this week.   If OK with patient, need to order PET.  Godfrey Pick, MD

## 2015-08-17 NOTE — Telephone Encounter (Signed)
-----   Message from [name], MD sent at 08/17/2015  1:48 PM EDT ----- See my phone note to patient 08-17-15. Need to try to reach her again if we don't hear back by later this week. thanks

## 2015-08-17 NOTE — Telephone Encounter (Signed)
MEDICAL ONCOLOGY  Called Dr Ova Freshwater office [phone]  She is out until later this week, but left message on her voice mail with CA 125 information and my plans to get PET.  Left my office cell and pager as contact #s.  L.Livesay MD

## 2015-08-17 NOTE — Telephone Encounter (Signed)
S/w Butch Penny per Dr Edwyna Shell note. CA 125 is slowly elevating and Dr Edwyna Shell wants to order PET soon. Pt is agreeable to this. Order placed.

## 2015-08-20 MED FILL — SIMVASTATIN 40 MG TABLET: 40 | 90 days supply | Qty: 90 | Fill #1

## 2015-08-30 ENCOUNTER — Ambulatory Visit (HOSPITAL_COMMUNITY)
Admission: RE | Admit: 2015-08-30 | Discharge: 2015-08-30 | Disposition: A | Discharge status: Home / Self Care | Payer: 59 | Source: Ambulatory Visit | Attending: Oncology | Admitting: Oncology

## 2015-08-30 ENCOUNTER — Telehealth: Payer: Self-pay | Admitting: Oncology

## 2015-08-30 ENCOUNTER — Other Ambulatory Visit: Payer: Self-pay | Admitting: Oncology

## 2015-08-30 DIAGNOSIS — C541 Malignant neoplasm of endometrium: Secondary | ICD-10-CM

## 2015-08-30 DIAGNOSIS — C7989 Secondary malignant neoplasm of other specified sites: Secondary | ICD-10-CM

## 2015-08-30 NOTE — Telephone Encounter (Signed)
Medical Oncology  Spoke with office of [facility] in [place], [phone] to request ER PR information on surgical path from 2014.  Request needs to be faxed to that office fax # [phone], including patient's name, DOB, surgical path # [phone] from 07-30-2012.   Information will be faxed back to this office when resulted, needs my name and our fax # [phone] or [phone] also on the request.  L.Dnya Hickle MD

## 2015-08-31 ENCOUNTER — Ambulatory Visit (HOSPITAL_COMMUNITY)
Admission: RE | Admit: 2015-08-31 | Discharge: 2015-08-31 | Disposition: A | Discharge status: Home / Self Care | Payer: 59 | Source: Ambulatory Visit | Attending: Oncology | Admitting: Oncology

## 2015-08-31 DIAGNOSIS — E041 Nontoxic single thyroid nodule: Secondary | ICD-10-CM | POA: Diagnosis not present

## 2015-08-31 DIAGNOSIS — C541 Malignant neoplasm of endometrium: Secondary | ICD-10-CM | POA: Diagnosis not present

## 2015-08-31 DIAGNOSIS — R971 Elevated cancer antigen 125 [CA 125]: Secondary | ICD-10-CM | POA: Insufficient documentation

## 2015-08-31 DIAGNOSIS — R590 Localized enlarged lymph nodes: Secondary | ICD-10-CM | POA: Diagnosis not present

## 2015-08-31 DIAGNOSIS — C7989 Secondary malignant neoplasm of other specified sites: Secondary | ICD-10-CM | POA: Diagnosis present

## 2015-08-31 LAB — GLUCOSE, CAPILLARY: Glucose-Capillary: 88 mg/dL (ref 65–99)

## 2015-08-31 MED ORDER — FLUDEOXYGLUCOSE F - 18 (FDG) INJECTION
7.5000 | Freq: Once | INTRAVENOUS | Status: AC | PRN
  Administered 2015-08-31: 7.5 via INTRAVENOUS

## 2015-09-01 ENCOUNTER — Other Ambulatory Visit: Payer: Self-pay | Admitting: Oncology

## 2015-09-01 ENCOUNTER — Telehealth: Payer: Self-pay | Admitting: *Deleted

## 2015-09-01 ENCOUNTER — Telehealth: Payer: Self-pay | Admitting: Oncology

## 2015-09-01 NOTE — Telephone Encounter (Signed)
s.w pt and advised on april appt....pt ok and aware °

## 2015-09-01 NOTE — Telephone Encounter (Signed)
"  Just had PET scan yesterday.  No call with results but call with appointment tt see MD next week.  I'm wondering if someone can call results to me.  This is concerning.  I assume something's going on or seen with my PET yesterday.  Please call me to let me know what was found in the PET."  [phone]."  Next scheduled F/U on 09-09-2015 Lab 3:30/ F/U 4:00.  [phone] called patient notifying her F/U request was made before images performed and not to worry.  Will notify provider of her request but I'm sure results will be reviewed at next week's F/U.

## 2015-09-06 ENCOUNTER — Telehealth: Payer: Self-pay

## 2015-09-06 ENCOUNTER — Telehealth: Payer: Self-pay | Admitting: Oncology

## 2015-09-06 NOTE — Telephone Encounter (Signed)
Received results, placed on Dr Edwyna Shell desk, copy to HIM.

## 2015-09-06 NOTE — Telephone Encounter (Signed)
Medical Oncology  LM for patient on cell, spoke with husband on home #, then reached patient on cell. She has been extremely anxious after seeing PET report in EMR.   Discussed PET findings, pathology information just received from [place] in [facility] today which documents ER PR +  on original surgical path from 07-2012, my phone conversation with Dr Polly Cobia earlier today about PET,  Path, and possible hormonal blockade with tamoxifen alternating with Megace every 3 weeks.   She goes to [place] on May 3. She understands concern for DVT from the hormonal blockers/ long plane flights such that Dr Polly Cobia and I prefer no treatment then. She may be able to take 2 weeks of tamoxifen beginning after I see her at office on 09-09-15, then hold until after returns from trip.   She understands that we will discuss completely again at visit on 09-09-15, which is my first available. She appreciated call  L.Marko Plume, MD

## 2015-09-06 NOTE — Telephone Encounter (Signed)
Diane Lozano is quite anxious about results of PET scan. I was able to answer a few of her questions but not all of them. She does have appt on 4/6 to discuss with Dr Marko Plume. I let Dr Marko Plume know of phone call.

## 2015-09-06 NOTE — Telephone Encounter (Signed)
Pt called stating her PET was released to mychart and she read it. She is anxious about the results. She wants clarification of what she read.  [id number] called pt back and LVM I was returning her call.

## 2015-09-08 ENCOUNTER — Other Ambulatory Visit: Payer: Self-pay | Admitting: Oncology

## 2015-09-09 ENCOUNTER — Encounter: Payer: Self-pay | Admitting: Oncology

## 2015-09-09 ENCOUNTER — Ambulatory Visit (HOSPITAL_BASED_OUTPATIENT_CLINIC_OR_DEPARTMENT_OTHER): Payer: 59 | Admitting: Oncology

## 2015-09-09 ENCOUNTER — Other Ambulatory Visit (HOSPITAL_BASED_OUTPATIENT_CLINIC_OR_DEPARTMENT_OTHER): Payer: 59

## 2015-09-09 VITALS — BP 159/82 | HR 87 | Temp 98.4°F | Resp 18 | Ht 63.0 in | Wt 149.4 lb

## 2015-09-09 DIAGNOSIS — E041 Nontoxic single thyroid nodule: Secondary | ICD-10-CM

## 2015-09-09 DIAGNOSIS — C772 Secondary and unspecified malignant neoplasm of intra-abdominal lymph nodes: Secondary | ICD-10-CM

## 2015-09-09 DIAGNOSIS — K5900 Constipation, unspecified: Secondary | ICD-10-CM

## 2015-09-09 DIAGNOSIS — C541 Malignant neoplasm of endometrium: Secondary | ICD-10-CM | POA: Diagnosis not present

## 2015-09-09 DIAGNOSIS — E789 Disorder of lipoprotein metabolism, unspecified: Secondary | ICD-10-CM

## 2015-09-09 DIAGNOSIS — C7989 Secondary malignant neoplasm of other specified sites: Secondary | ICD-10-CM

## 2015-09-09 LAB — CBC WITH DIFFERENTIAL/PLATELET
BASO%: 1.1 % (ref 0.0–2.0)
Basophils Absolute: 0 10*3/uL (ref 0.0–0.1)
EOS%: 2 % (ref 0.0–7.0)
Eosinophils Absolute: 0.1 10*3/uL (ref 0.0–0.5)
HCT: 37 % (ref 34.8–46.6)
HGB: 12.4 g/dL (ref 11.6–15.9)
LYMPH%: 19.8 % (ref 14.0–49.7)
MCH: 31.7 pg (ref 25.1–34.0)
MCHC: 33.6 g/dL (ref 31.5–36.0)
MCV: 94.3 fL (ref 79.5–101.0)
MONO#: 0.2 10*3/uL (ref 0.1–0.9)
MONO%: 6.2 % (ref 0.0–14.0)
NEUT#: 2.8 10*3/uL (ref 1.5–6.5)
NEUT%: 70.9 % (ref 38.4–76.8)
Platelets: 155 10*3/uL (ref 145–400)
RBC: 3.92 10*6/uL (ref 3.70–5.45)
RDW: 12.3 % (ref 11.2–14.5)
WBC: 4 10*3/uL (ref 3.9–10.3)
lymph#: 0.8 10*3/uL — ABNORMAL LOW (ref 0.9–3.3)

## 2015-09-09 LAB — COMPREHENSIVE METABOLIC PANEL
ALT: 16 U/L (ref 0–55)
AST: 19 U/L (ref 5–34)
Albumin: 4.2 g/dL (ref 3.5–5.0)
Alkaline Phosphatase: 64 U/L (ref 40–150)
Anion Gap: 8 mEq/L (ref 3–11)
BUN: 19 mg/dL (ref 7.0–26.0)
CO2: 27 mEq/L (ref 22–29)
Calcium: 9.8 mg/dL (ref 8.4–10.4)
Chloride: 106 mEq/L (ref 98–109)
Creatinine: 0.9 mg/dL (ref 0.6–1.1)
EGFR: 70 mL/min/{1.73_m2} — ABNORMAL LOW (ref >90)
Glucose: 135 mg/dl (ref 70–140)
Potassium: 3.9 mEq/L (ref 3.5–5.1)
Sodium: 141 mEq/L (ref 136–145)
Total Bilirubin: 0.4 mg/dL (ref 0.20–1.20)
Total Protein: 7.6 g/dL (ref 6.4–8.3)

## 2015-09-09 MED ORDER — TAMOXIFEN CITRATE 20 MG PO TABS: 20.0000 mg | ORAL_TABLET | Freq: Two times a day (BID) | ORAL | Status: DC

## 2015-09-09 MED ORDER — MEGESTROL ACETATE 40 MG PO TABS: 80.0000 mg | ORAL_TABLET | Freq: Two times a day (BID) | ORAL | Status: DC

## 2015-09-09 NOTE — Progress Notes (Signed)
OFFICE PROGRESS NOTE   September 10, 2015   Physicians: Genia Del, MD, Gery Pray MD, [name] MD  INTERVAL HISTORY:  Patient is seen, together with husband, in follow up of recurrent endometrial carcinoma, most recently treated with carboplatin taxol 11-2014 thru 02-11-2015 (tolerated poorly with cytopenias) then radiation to retroperitoneal nodes completed 05-12-16. She had CT AP 06-17-15 and PET 08-31-15. She saw Dr Polly Cobia 07-29-15 and is to see her again in 3 months.I have discussed PET and path ER/PR information with her by phone prior to today's visit.  Next appointment with Dr Sondra Come is 09-30-15.  Patient has felt well since she was here last a month ago. Energy and appetite are good, no pain, bowels ok, no bleeding, no LE swelling, no fever or symptoms of infection. Remainder of 10 point Review of Systems negative.  She is going with her adult children to [place] 5-3 thru 10-20-15.  No central catheter.  No genetics testing CA 125 132 on 11-27-14 (day 1 cycle 1)  ONCOLOGIC HISTORY Patient presented to PCP with intermittent vaginal bleeding since ~ Oct 2013, endometrial biopsy 06-20-12 with complex endometrial hyperplasia with atypia. Pelvic and transvaginal US in [facility] system on 06-26-12 showed endometrial thickening of 2.7 cm, right ovary not clearly seen and left ovary with normal postmenopausal appearance. She was seen by Dr Christophe Louis and referred to Dr Polly Cobia. She had robotic hysterectomy with BSO and bilateral pelvic and periaortic node dissection by Dr Polly Cobia at [facility] on 07-30-2012. Intraoperatively she had large endometrial polyp with high grade malignancy, no obvious extrauterine disease. Pathology ([facility] [place] [id number] from 07-30-2012) had high grade, poorly differentiated endometrial adenocarcinoma with solid and serous papillary components, 2 periaortic nodes negative and 9 right pelvic/11 left pelvic nodes negative. The  tumor was 4.8 cm confined to polyp with no definite myometrial involvement, margins clear and no angiolymphatic invasion identified.  Cycle 1 taxol carboplatin was given 08-30-12, 6 cycles of q 3 week regimen given thru 12-13-2012, with gCSF support. Radiation was intracavitary HDR to proximal vagina 30 Gy in 5 fractions from May 27 thru November 27, 2012, by Dr Sondra Come. Patient was followed closely by Dr Polly Cobia after completing adjuvant taxol carboplatin chemotherapy in 12-2012. Patient felt very well, with intentional 70 lb weight loss over a year from Jan thru Dec 2015 on [employer]. She saw Dr Polly Cobia with unremarkable exam in March 2016, however CA 125 was slightly elevated at 36.3, up from previous 29.2. She had CT CAP with contrast 10-16-14 with new left periaortic and left common iliac lymphadenopathy. PET 6-44-03 had hypermetabolic uptake in retroperitoneal nodes from lower poles of kidneys to aortic bifurcation,largest 2.0 x 2.1 cm, as well as uptake in either right supraclavicular node vs right lobe of thyroid (report below). FNA of thyroid 10-28-14 with benign tissue, [place] at time of biopsy reportedly showing no right supraclavicular adenopathy. She had CT needle biopsy of left para-aortic node 11-16-14 with path showing serous carcinoma. She began chemotherapy with dose dense carbo taxol on 11-27-14, with carbo at AUC = 5. Over next 3 weeks WBC and platelets were too low for treatments, despite total 7 doses of granix. She had taxol only cycle 2, then [name] at AUC=2 + taxol cycle 3. Counts maintained with increase in gCSF to 480 mcg 3-4 doses after each treatment, including increase in [name] for third dose to AUC =3 on 02-11-15. PET 03-01-15 had uptake in one left peri-aortic node and the area of  previous benign biopsy right lobe thyroid. She received IMRT, summary as follows:  Diagnosis: Recurrent Endometrial cancer (high-grade serous adenocarcinoma) with metastasis to the retroperitoneal lymph  nodes  Indication for treatment: Isolated recurrence in this region, definitive treatment  Radiation treatment dates: October 31 through December 8 Site/dose: PET positive retroperitoneal nodes 50.4 gray in 28 fractions with simultaneous integrated boost to 56 gray CT AP 06-17-15 showed no apparent residual or progressive disease. PET 08-31-15, compared with 03-01-15 and CT 06-17-15, showed no findings of concern for persistent or progressive disease in abdomen or pelvis. There is same hypermetabolic right thyroid nodule (2.8 cm with SUV 10.4 compared with 2.7 cm with SUV 10 on prior) and new right paratracheal adenopathy with node 2.3 cm and SUV 17.7.  Surgical path from 07-30-12 tested for ER PR on 09-06-15, with ER + intermediate in 25-35% and PR + strong in 25-35%.   Objective:  Vital signs in last 24 hours:  BP 159/82 mmHg  Pulse 87  Temp(Src) 98.4 F (36.9 C) (Oral)  Resp 18  Ht '5\' 3"'$  (1.6 m)  Wt 149 lb 6.4 oz (67.767 kg)  BMI 26.47 kg/m2  SpO2 100% Weight down 4 lbs Alert, oriented and appropriate. Ambulatory without difficulty. Respirations not labored. Appears comfortable.  HEENT:PERRL, sclerae not icteric. Oral mucosa moist without lesions, posterior pharynx clear.  Neck supple. No JVD.  Lymphatics:no cervical,supraclavicular adenopathy Resp: clear to auscultation bilaterally and normal percussion bilaterally Cardio: regular rate and rhythm. No gallop. GI: soft, nontender, not distended, no mass or organomegaly. Normally active bowel sounds.  Musculoskeletal/ Extremities: without pitting edema, cords, tenderness Neuro: no peripheral neuropathy. Otherwise nonfocal Skin without rash, ecchymosis, petechiae  Lab Results:  Results for orders placed or performed in visit on 09/09/15  CBC with Differential  Result Value Ref Range   WBC 4.0 3.9 - 10.3 10e3/uL   NEUT# 2.8 1.5 - 6.5 10e3/uL   HGB 12.4 11.6 - 15.9 g/dL   HCT 37.0 34.8 - 46.6 %   Platelets 155 145 - 400  10e3/uL   MCV 94.3 79.5 - 101.0 fL   MCH 31.7 25.1 - 34.0 pg   MCHC 33.6 31.5 - 36.0 g/dL   RBC 3.92 3.70 - 5.45 10e6/uL   RDW 12.3 11.2 - 14.5 %   lymph# 0.8 (L) 0.9 - 3.3 10e3/uL   MONO# 0.2 0.1 - 0.9 10e3/uL   Eosinophils Absolute 0.1 0.0 - 0.5 10e3/uL   Basophils Absolute 0.0 0.0 - 0.1 10e3/uL   NEUT% 70.9 38.4 - 76.8 %   LYMPH% 19.8 14.0 - 49.7 %   MONO% 6.2 0.0 - 14.0 %   EOS% 2.0 0.0 - 7.0 %   BASO% 1.1 0.0 - 2.0 %  Comprehensive metabolic panel  Result Value Ref Range   Sodium 141 136 - 145 mEq/L   Potassium 3.9 3.5 - 5.1 mEq/L   Chloride 106 98 - 109 mEq/L   CO2 27 22 - 29 mEq/L   Glucose 135 70 - 140 mg/dl   BUN 19.0 7.0 - 26.0 mg/dL   Creatinine 0.9 0.6 - 1.1 mg/dL   Total Bilirubin 0.40 0.20 - 1.20 mg/dL   Alkaline Phosphatase 64 40 - 150 U/L   AST 19 5 - 34 U/L   ALT 16 0 - 55 U/L   Total Protein 7.6 6.4 - 8.3 g/dL   Albumin 4.2 3.5 - 5.0 g/dL   Calcium 9.8 8.4 - 10.4 mg/dL   Anion Gap 8 3 - 11 mEq/L  EGFR 70 (L) >90 ml/min/1.73 m2  CA 125  Result Value Ref Range   Cancer Antigen (CA) 125 37.8 0.0 - 38.1 U/mL   CA 125 available after visit stable from 08-12-15 (by same method 37.5 on 08-12-15 and 24.4 on 06-17-15)  Studies/Results:   EXAM: NUCLEAR MEDICINE PET SKULL BASE TO THIGH  TECHNIQUE: 7.5 mCi F-18 FDG was injected intravenously. Full-ring PET imaging was performed from the skull base to thigh after the radiotracer. CT data was obtained and used for attenuation correction and anatomic localization.  FASTING BLOOD GLUCOSE: Value: 88 mg/dl  COMPARISON: 52/77/8242. Abdominal pelvic CT of 06/17/2015.  FINDINGS: NECK  No hypermetabolic cervical nodes.  Right-sided thyroid nodule measures 2.8 cm and a S.U.V. max of 10.4. 2.7 cm and a S.U.V. max of 10.0 on the prior exam (when remeasured).  CHEST  New hypermetabolic right paratracheal adenopathy, including at 2.3 cm and a S.U.V. max of 17.7 on image 64/series  4.  ABDOMEN/PELVIS  Left adrenal hypermetabolism is without CT correlate and favored be physiologic. Measures a S.U.V. max of 4.3. The previously described left periaortic abdominal node is decreased in size at 6 mm and is no longer hypermetabolic.  SKELETON  No abnormal marrow activity.  CT IMAGES PERFORMED FOR ATTENUATION CORRECTION  No cervical adenopathy. Tiny hiatal hernia. Colonic stool burden suggests constipation. Abdominal aortic atherosclerosis. Hysterectomy. No significant free fluid. Degenerative sclerosis about the left sacroiliac joint.  IMPRESSION: 1. Development of right paratracheal hypermetabolic adenopathy, consistent with nodal metastasis. 2. The previously described isolated abdominal retroperitoneal hypermetabolic node has resolved. 3. Persistent hypermetabolic right thyroid nodule, per report previously biopsied. Correlate with those results. 4. Possible constipation.     PET discussed directly with IR physician. PACs images reviewed with patient and husband at visit.        Non-Gyn Cytology Final Report5/25/2016  [facility]  Result Narrative  [phone]   DIAGNOSIS   THYROID RIGHT LOBE, FINE NEEDLE ASPIRATION:  BENIGN THYROID PARENCHYMA  MOST CONSISTENT WITH HYPERPLASTIC THYROID NODULE.  NO TUMOR IDENTIFIED  (CAT)     Corine Shelter MD  PATHOLOGIST  (CASE SIGNED 10/29/2014)   CLINICAL INFORMATION   RIGHT SIDE THYROID ENLARGEMENT NO DISTINCT NODULE.  HISTORY OF  ENDOMETRIAL CANCER   SPECIMEN  THYROID, RT-FINE NEEDLE ASPIRATE      Medications: I have reviewed the patient's current medications. Tamoxifen 20 mg bid x 3 weeks alternating with Megace 80 mg bid x 3 weeks.  DISCUSSION Information and images from PET reviewed with patient and husband; they understand that I have discussed also with Dr Clifton James and with IR.  I have told patient that it is unusual but not unheard of for endometrial cancers to  spread to central chest. The paratracheal nodes cannot be biopsied by IR, likely would require bronchoscopic biopsy by pulmonary or thoracic surgery if path necessary.  With hypermetabolic uptake still in right thyroid nodule, despite benign biopsy at [facility] in 10-2014, seems reasonable to repeat biopsy by IR.  Patient would like endocrine consult, PCP is in [facility] system. Following visit I have spoken with office for Dr Evlyn Kanner and Dr Sharl Ma, next available appointments possibly July-Aug.  TFTs 11-2014: TSH 0.516, T3U 28, T4 7.0  Information from ER/PR testing discussed, as shared with Dr Clifton James prior to this visit. Note papillary serous histology not as likely to be ER/PR +, but does give option of hormonal intervention. Discussed role of ER and PR receptors in malignancy and therapeutic use of tamoxifen and megace  in gyn cancers. Discussed fact that hormonal therapy is sometimes used for low volume disease or as maintenance type treatment after response of recurrent disease to other interventions. I have told her again that the recurrent endometrial cancer is not expected to be eradicated, tho we are pleased with excellent results on this PET in area of irradiated periaortic node. We have discussed possible side effects of tamoxifen and megace, and regimen of alternating tamoxifen and megace every 3 weeks. We will follow exam, marker and scans on the treatment. Patient is in favor of using tamoxifen and megace as discussed, prefers not to wait until she returns from [place] to begin, tho she does agree with holding treatment around the trip due to increased risk of DVTs including with the long plane flights. We have decided to start tamoxifen ~ 09-10-15 x 2 weeks, so that she will be off x a week prior to trip, then will resume when she returns.    Assessment/Plan:  1.recurrent high grade serous endometrial carcinoma involving para-aortic and common iliac nodes: ~ 22 months from completion of adjuvant  chemotherapy for IA poorly differentiated endometrial adenocarcinoma. Even dose reduced carboplatin and weekly taxol with granix was difficult due to low counts; PET after initial chemo only uptake in a left periaortic node (+thyroid where previously biopsied). Completed IMRT to retroperitoneal nodes 05-13-15. CT AP 06-17-15 no apparent residual or progressive disease. With some increase in CA125, PET obtained 08-31-15 with uptake right thyroid and paratracheal node, none in AP. Plan alternating tamoxifen and megace. 2.Right thyroid nodule: hypermetabolic on PET, biopsy in [facility] system 10-2014 no malignancy. Will repeat US biopsy by IR and ask endocrine to see.  3.right paratracheal node(s) new from last scans and hypermetabolic on PET 1-93-79. I have suggested that we follow on the tamoxifen/ megace for now (+thyroid biopsy).  4.intentional weight loss of 70 lbs over the 2015 calendar year, trying to maintain now. Hypoglycemia intermittently by labs during treatment, ok today 5.chronic constipation, which was a concern during chemotherapy. Up to date on colonoscopy. 6..BP good without antihypertensives now, follow. Likely improved with weight loss  7..elevated lipids 8.remote minimal tobacco, not enough to qualify for the lung cancer screening program 9.up to date mammograms 10. flu vaccine 02-23-15 11.possibly low bone density, tho I do not find DEXA in this EMR   All questions answered and she knows to call if concerns prior to next appointment. She will keep appointment with Dr Sondra Come on 09-30-15. Time spent 40 min including >50% counseling and coordination of care. We will try to get thyroid biopsy and return visit to discuss prior to her upcoming trip.  Cc Drs Genevie Cheshire, route to PCP, fax for new patient endocrine referral  [name], MD   09/10/2015, 2:38 PM

## 2015-09-10 ENCOUNTER — Telehealth: Payer: Self-pay | Admitting: Oncology

## 2015-09-10 LAB — CA 125: Cancer Antigen (CA) 125: 37.8 U/mL (ref 0.0–38.1)

## 2015-09-10 MED FILL — TAMOXIFEN 20 MG TABLET: 20 | 21 days supply | Qty: 42 | Fill #0

## 2015-09-10 MED FILL — VENLAFAXINE HCL ER 75 MG CA: 75 | 90 days supply | Qty: 90 | Fill #2

## 2015-09-10 MED FILL — MEGESTROL 40 MG TABLET: 40 | 21 days supply | Qty: 84 | Fill #0

## 2015-09-10 NOTE — Telephone Encounter (Signed)
Medical Oncology  LM on cell that labs from 4-6 stable (CA 125), that US thyroid bx ordered and that I have requested new patient appointment with Dr Forde Dandy or Dr Buddy Duty, tho first apts may be July-Aug per my conversations with those physician offices now.  Godfrey Pick, MD

## 2015-09-13 ENCOUNTER — Telehealth: Payer: Self-pay | Admitting: Radiation Oncology

## 2015-09-13 NOTE — Telephone Encounter (Signed)
Left message to see if patient would be willing to switch time from her 10 o'clock time to an 8:30 time

## 2015-09-20 ENCOUNTER — Ambulatory Visit (HOSPITAL_COMMUNITY)
Admission: RE | Admit: 2015-09-20 | Discharge: 2015-09-20 | Disposition: A | Discharge status: Home / Self Care | Payer: 59 | Source: Ambulatory Visit | Attending: Oncology | Admitting: Oncology

## 2015-09-20 DIAGNOSIS — E041 Nontoxic single thyroid nodule: Secondary | ICD-10-CM | POA: Insufficient documentation

## 2015-09-20 NOTE — Procedures (Signed)
R thyroid nodule FNA bx [place] No complication No blood loss. See complete dictation in [facility].

## 2015-09-22 ENCOUNTER — Other Ambulatory Visit: Payer: Self-pay | Admitting: Oncology

## 2015-09-22 ENCOUNTER — Telehealth: Payer: Self-pay

## 2015-09-22 DIAGNOSIS — C541 Malignant neoplasm of endometrium: Secondary | ICD-10-CM

## 2015-09-22 DIAGNOSIS — C772 Secondary and unspecified malignant neoplasm of intra-abdominal lymph nodes: Secondary | ICD-10-CM

## 2015-09-22 MED ORDER — ALPRAZOLAM 0.25 MG PO TABS: 0.2500 mg | ORAL_TABLET | Freq: Two times a day (BID) | ORAL | Status: DC | PRN

## 2015-09-22 NOTE — Telephone Encounter (Addendum)
Ms Roughton stated that she ws not concerned about the thyroid BX.  Glad it is benign. She is focusing on the paratracheal  node seen on the PET scan.  The fact that the cancer is in another place.  Emphasized the fact that the PET showed excellent response to treatment per Dr. Mariana Kaufman note 09-09-15. She is realizing that cancer is not going to be cured and she is facing her mortality.  She also is feeling that she is not "actively" doing something to treat the cancer as she is just taking a pill and no medication IV.  Validated her feelings.  The findings of the paratracheal node has put a damper on her  trip to [place] on 10-06-15.  This is her first trip to [place]. She does not want to get on the plane and have an anxiety attack.  She felt a little light headed today at the office  and the anxiety escalated that is when she called Dr. Mariana Kaufman office. She is interested in counseling to work on underlying issues and learn how to manage symptoms and only  use xanax for severe anxiety. Told her Dr. Marko Plume will prescribe Xanax 0.25 mg q 12 hrs prn severe anxiety. Will make a referral to [facility] for counseling.  Ms. Pavelka stated that she is tolerating the Tamoxifen well.  She has experience some Hot Flashes with the Tamoxifen.

## 2015-09-22 NOTE — Telephone Encounter (Signed)
-----   Message from [name], MD sent at 09/22/2015  3:17 PM EDT ----- Please let her know the biopsy is benign - that may be enough for anxiety. If not, find out what she has used in past and what she is anxious about - ? Medical issues or upcoming trip etc  thanks ----- Message -----    From: Baruch Merl, RN    Sent: 09/22/2015   2:37 PM      To: [name], MD  Actually Ms. Ashline called this afternoon and left a message that she is having some anxiety issues and they seen to be getting away from her. She was wondering if Dr. Marko Plume would give her a prescription.  ----- Message -----    From: [name], MD    Sent: 09/22/2015   2:19 PM      To: Baruch Merl, RN  (that one cytology report seems to be it)  Please let her know no malignancy on the thyroid cytology! I will be interested in knowing what the endocrinologist says -  RN please be sure we got all information sent to Dr Forde Dandy and/or Dr Buddy Duty - I believe they needed to review and then would let patient (?) or this office know about apt.  Please send the thyroid US aspiration report and the path report for that also  Ask if she has done ok with tamoxifen so far  thanks

## 2015-09-22 NOTE — Addendum Note (Signed)
Addended by: Baruch Merl on: 09/22/2015 07:09 PM   Modules accepted: Orders

## 2015-09-23 ENCOUNTER — Ambulatory Visit: Payer: Self-pay | Admitting: Radiation Oncology

## 2015-09-23 ENCOUNTER — Ambulatory Visit
Admission: RE | Admit: 2015-09-23 | Discharge: 2015-09-23 | Disposition: A | Discharge status: Home / Self Care | Payer: 59 | Source: Ambulatory Visit | Attending: Radiation Oncology | Admitting: Radiation Oncology

## 2015-09-23 ENCOUNTER — Encounter: Payer: Self-pay | Admitting: Radiation Oncology

## 2015-09-23 VITALS — BP 131/72 | HR 78 | Temp 98.5°F | Ht 63.0 in | Wt 144.8 lb

## 2015-09-23 DIAGNOSIS — C771 Secondary and unspecified malignant neoplasm of intrathoracic lymph nodes: Secondary | ICD-10-CM | POA: Insufficient documentation

## 2015-09-23 NOTE — Telephone Encounter (Signed)
Thanks for referral!  I left her a VM just now.  Diane Lozano

## 2015-09-23 NOTE — Progress Notes (Signed)
Radiation Oncology         [phone] ________________________________  Name: Diane Lozano MRN: [id number]  Date: 09/23/2015  DOB: [birth year]  Follow-Up Visit Note  CC: [name], MD  [name], MD    ICD-9-CM ICD-10-CM   1. Metastatic cancer to intrathoracic lymph nodes (HCC) 196.1 C77.1     Diagnosis: Recurrent Endometrial cancer (high-grade serous adenocarcinoma) with metastasis to the retroperitoneal lymph nodes, Now with isolated intrathoracic nodal recurrence     Interval Since Last Radiation: 4 months  October 31 through May 13, 2015: PET positive retroperitoneal nodes 50.4 gray in 28 fractions with simultaneous integrated boost to 56 gray  May 27, June 5,  June 11, June 19, November 27, 2012: Proximal vagina 30 Gy in 5 fractions  Narrative:  The patient returns today for routine follow-up. On 08/12/15, the patient's CA 125 marker was elevated and Dr. Marko Plume ordered a PET scan. PET scan on 08/31/15 showed the development of new right paratracheal hypermetabolic adenopathy that is consistent with nodal metastasis, a persistent hypermetabolic right thyroid nodule, and the previously described abdominal retroperitoneal hypermetabolic node has resolved. On 09/20/15, a fine needle aspiration biopsy of the right thyroid nodule was conducted. The findings were benign.  ROS: She is taking Tamoxifen and has experienced some hot flashes a side effect. She denies pain or any other problems at this time. She reports she is eating well. No chest pain cough or swallowing difficulties.  ALLERGIES:  has No Known Allergies.  Meds: Current Outpatient Prescriptions  Medication Sig Dispense Refill  . ALPRAZolam (XANAX) 0.25 MG tablet Take 1 tablet (0.25 mg total) by mouth every 12 (twelve) hours as needed (for severe anxiety). 7 tablet 0  . Calcium Carb-Cholecalciferol 500-600 MG-UNIT TABS Take 1 tablet by mouth daily.    . megestrol (MEGACE) 40 MG tablet Take 2 tablets (80 mg  total) by mouth 2 (two) times daily. X 3 weeks alternating with tamoxifen 84 tablet 3  . simvastatin (ZOCOR) 40 MG tablet Take 40 mg by mouth every evening.    . tamoxifen (NOLVADEX) 20 MG tablet Take 1 tablet (20 mg total) by mouth 2 (two) times daily. X 3 weeks alternating with Megace. 42 tablet 3  . venlafaxine XR (EFFEXOR-XR) 75 MG 24 hr capsule Take 75 mg by mouth daily.    Marland Kitchen LORazepam (ATIVAN) 1 MG tablet Place 1/2 -1 tablet under the tongue or swallow every 6 hrs as needed for nausea.  Will make drowsy. (Patient not taking: Reported on 06/24/2015) 30 tablet 1  . ondansetron (ZOFRAN) 8 MG tablet Take 1 tablet (8 mg total) by mouth every 8 (eight) hours as needed for nausea or vomiting. (Patient not taking: Reported on 06/24/2015) 30 tablet 2  . prochlorperazine (COMPAZINE) 10 MG tablet Take 1 tablet (10 mg total) by mouth every 6 (six) hours as needed for nausea or vomiting. (Patient not taking: Reported on 06/24/2015) 30 tablet 1   No current facility-administered medications for this encounter.    Physical Findings: The patient is in no acute distress. Patient is alert and oriented.  height is 5\' 3"  (1.6 m) and weight is 144 lb 12.8 oz (65.681 kg). Her temperature is 98.5 F (36.9 C). Her blood pressure is 131/72 and her pulse is 78.   Lungs are clear to auscultation bilaterally. Heart has regular rate and rhythm. No palpable cervical, supraclavicular, or axillary adenopathy. Abdomen soft, non-tender.  Lab Findings: Lab Results  Component Value Date   WBC  4.0 09/09/2015   HGB 12.4 09/09/2015   HCT 37.0 09/09/2015   MCV 94.3 09/09/2015   PLT 155 09/09/2015    Radiographic Findings: Nm Pet Image Restag (ps) Skull Base To Thigh  08/31/2015  CLINICAL DATA:  Subsequent treatment strategy for restaging of endometrial cancer. EXAM: NUCLEAR MEDICINE PET SKULL BASE TO THIGH TECHNIQUE: 7.5 mCi F-18 FDG was injected intravenously. Full-ring PET imaging was performed from the skull base to  thigh after the radiotracer. CT data was obtained and used for attenuation correction and anatomic localization. FASTING BLOOD GLUCOSE:  Value: 88 mg/dl COMPARISON:  03/01/2015.  Abdominal pelvic CT of 06/17/2015. FINDINGS: NECK No hypermetabolic cervical nodes. Right-sided thyroid nodule measures 2.8 cm and a S.U.V. max of 10.4. 2.7 cm and a S.U.V. max of 10.0 on the prior exam (when remeasured). CHEST New hypermetabolic right paratracheal adenopathy, including at 2.3 cm and a S.U.V. max of 17.7 on image 64/series 4. ABDOMEN/PELVIS Left adrenal hypermetabolism is without CT correlate and favored be physiologic. Measures a S.U.V. max of 4.3. The previously described left periaortic abdominal node is decreased in size at 6 mm and is no longer hypermetabolic. SKELETON No abnormal marrow activity. CT IMAGES PERFORMED FOR ATTENUATION CORRECTION No cervical adenopathy. Tiny hiatal hernia. Colonic stool burden suggests constipation. Abdominal aortic atherosclerosis. Hysterectomy. No significant free fluid. Degenerative sclerosis about the left sacroiliac joint. IMPRESSION: 1. Development of right paratracheal hypermetabolic adenopathy, consistent with nodal metastasis. 2. The previously described isolated abdominal retroperitoneal hypermetabolic node has resolved. 3. Persistent hypermetabolic right thyroid nodule, per report previously biopsied. Correlate with those results. 4.  Possible constipation. Electronically Signed   By: Abigail Miyamoto M.D.   On: 08/31/2015 10:05   US Biopsy  09/20/2015  CLINICAL DATA:  Hypermetabolic right thyroid nodule on PET-CT EXAM: ULTRASOUND-GUIDED THYROID ASPIRATION BIOPSY TECHNIQUE: The procedure, risks (including but not limited to bleeding, infection, organ damage ), benefits, and alternatives were explained to the patient. Questions regarding the procedure were encouraged and answered. The patient understands and consents to the procedure. Survey ultrasound was performed and the  dominant lesion in the right lobe was localized. An appropriate skin entry site was determined. Skin was marked, then prepped with Betadine, draped in usual sterile fashion, and infiltrated locally with 1% lidocaine. Under real-time ultrasound guidance, 4 passes were made into the lesion with 25 gauge needles, submitted to cytopathology. The patient tolerated procedure well. COMPLICATIONS: COMPLICATIONS none IMPRESSION: 1. Technically successful ultrasound-guided thyroid aspiration biopsy , dominant right nodule. Electronically Signed   By: Lucrezia Europe M.D.   On: 09/20/2015 15:31    Impression:  The patient is recovering from the effects of radiation.  Recent PET scan showed the development of new right paratracheal hypermetabolic adenopathy that is consistent with nodal metastasis and a persistent hypermetabolic right thyroid nodule. However, the abdominal retroperitoneal hypermetabolic node has resolved and biopsy of the hypermetabolic thyroid nodule was benign.  Plan: The patient is curious to see if the right paratracheal adenopathy could be biopsied. Her case will be presented to thoracic conference next week. We will also present her case to the upcoming gynecologic oncology conference on 09/27/15 to get input whether to treat this new area with radiation in addition to her hormone therapy since this is an isolated area of occurrence (oligometastasis).  The patient will be on a trip to [place] from 10/06/15 - 10/20/15. ____________________________________  Blair Promise, PhD, MD  This document serves as a record of services personally performed by Gery Pray, MD.  It was created on his behalf by Darcus Austin, a trained medical scribe. The creation of this record is based on the scribe's personal observations and the provider's statements to them. This document has been checked and approved by the attending provider.

## 2015-09-23 NOTE — Progress Notes (Addendum)
Diane Lozano is here for follow up of radiation completed 05/13/15 to retroperitoneal nodes. She is here today because of new positive abdominal node discovered on a PET scan several weeks ago. She is taking Tamoxifen and has experienced some hot flashes a side effect. She is here today to discuss the results of the PET scan with Dr. Sondra Come. She has recovered well from her radiation and denies pain, or any other problems. She reports she is eating well.   BP 131/72 mmHg  Pulse 78  Temp(Src) 98.5 F (36.9 C)  Ht 5\' 3"  (1.6 m)  Wt 144 lb 12.8 oz (65.681 kg)  BMI 25.66 kg/m2   Wt Readings from Last 3 Encounters:  09/23/15 144 lb 12.8 oz (65.681 kg)  09/09/15 149 lb 6.4 oz (67.767 kg)  08/12/15 153 lb 1.6 oz (69.446 kg)

## 2015-09-24 ENCOUNTER — Telehealth: Payer: Self-pay

## 2015-09-24 NOTE — Telephone Encounter (Signed)
-----   Message from [name], MD sent at 09/23/2015 11:31 AM EDT ----- Regarding: referral to Dr Carrolyn Meiers Please send new patient information to attn Dr Reynold Bowen:  09-20-15 US thyroid report  09-20-15 cytology report 08-31-15 PET LL notes 09-09-15 and 11-20-14 TFTs 11-27-14 meds Latest CBC CMET Demographics  I understand from his office that he reviews the information to decide about a new patient visit. If cannot get visit with him, I need to make referral to another MD    thanks

## 2015-09-24 NOTE — Telephone Encounter (Signed)
Spoke with Diane Lozano in person and she stated that the records were sent to Dr. Forde Dandy as requested below by Dr. Marko Plume.

## 2015-09-27 ENCOUNTER — Encounter: Payer: Self-pay | Admitting: Gynecologic Oncology

## 2015-09-27 NOTE — Progress Notes (Signed)
Gynecologic Oncology Multi-Disciplinary Disposition Conference Note  Date of the Conference: September 27, 2015  Patient Name: Diane Lozano  Primary GYN Oncologist: Dr. Deberah Pelton Dr. Marko Plume, Dr. Sondra Come  Stage/Disposition:  Recurrent Endometrial Carcinoma.  Plan for biopsy of right paratracheal adenopathy.  Continue hormone therapy for three months with re-imaging after for re-evaluation for cytotoxic therapy.  Consider radiation if only focal disease remains.   This Multidisciplinary conference took place involving physicians from [place], [place], Radiation Oncology, Pathology, Radiology along with the Gynecologic Oncology Nurse Practitioner and RN.  Comprehensive assessment of the patient's malignancy, staging, need for surgery, chemotherapy, radiation therapy, and need for further testing were reviewed. Supportive measures, both inpatient and following discharge were also discussed. The recommended plan of care is documented. Greater than 35 minutes were spent correlating and coordinating this patient's care.

## 2015-09-28 ENCOUNTER — Encounter: Payer: Self-pay | Admitting: General Practice

## 2015-09-28 NOTE — Progress Notes (Signed)
Spiritual Care Note  Met with Diane Lozano for one hour today to help her cope with anxiety related to recurrence and mortality.  She was very receptive, engaged, and enthusiastic, sharing frankly about her concerns and hopes.  We focused on strategies for acknowledging her feelings, recognizing that they are temporary, and coping with distress (practicing physical and emotional mindfulness, various types of prayer, guided/comforting imagery, grounding in her body/present moment, etc).  Repeatedly she verbalized helpfulness of affirmations, normalization of feelings, and practical tools.  Per pt, she felt relieved, supported, and more prepared to embark on her upcoming two-week trip to [place] (a longtime desire) after our session.  We made a f/u appointment in late May for her to share and process about her trip (and also as a safeguard in case anxiety/depression surges with a letdown upon return to daily life after the trip).  [name] is aware of ongoing chaplain availability and flexibility for support in the meantime as needed/desired.  Please also page as needs arise.  Thank you.  [place], [place], [facility] Pager [phone] Voicemail  [phone]

## 2015-09-30 ENCOUNTER — Ambulatory Visit: Payer: 59 | Admitting: Radiation Oncology

## 2015-09-30 ENCOUNTER — Telehealth: Payer: Self-pay

## 2015-09-30 NOTE — Telephone Encounter (Signed)
Sherry left a message stating that Dr. Forde Dandy could see Ms. Bordonaro  in September or October of 2017. Dr. Marko Plume aware of this time fram and will discuss with Ms. Mato at her appointment 10-04-15.

## 2015-10-03 ENCOUNTER — Other Ambulatory Visit: Payer: Self-pay | Admitting: Oncology

## 2015-10-03 DIAGNOSIS — E041 Nontoxic single thyroid nodule: Secondary | ICD-10-CM

## 2015-10-03 DIAGNOSIS — C541 Malignant neoplasm of endometrium: Secondary | ICD-10-CM

## 2015-10-04 ENCOUNTER — Encounter: Payer: Self-pay | Admitting: Oncology

## 2015-10-04 ENCOUNTER — Ambulatory Visit (HOSPITAL_BASED_OUTPATIENT_CLINIC_OR_DEPARTMENT_OTHER): Payer: 59 | Admitting: Oncology

## 2015-10-04 ENCOUNTER — Other Ambulatory Visit (HOSPITAL_BASED_OUTPATIENT_CLINIC_OR_DEPARTMENT_OTHER): Payer: 59

## 2015-10-04 ENCOUNTER — Telehealth: Payer: Self-pay | Admitting: Oncology

## 2015-10-04 VITALS — BP 146/80 | HR 65 | Temp 98.1°F | Resp 17 | Ht 63.0 in | Wt 143.7 lb

## 2015-10-04 DIAGNOSIS — D72819 Decreased white blood cell count, unspecified: Secondary | ICD-10-CM | POA: Diagnosis not present

## 2015-10-04 DIAGNOSIS — E041 Nontoxic single thyroid nodule: Secondary | ICD-10-CM

## 2015-10-04 DIAGNOSIS — C772 Secondary and unspecified malignant neoplasm of intra-abdominal lymph nodes: Secondary | ICD-10-CM | POA: Diagnosis not present

## 2015-10-04 DIAGNOSIS — D701 Agranulocytosis secondary to cancer chemotherapy: Secondary | ICD-10-CM

## 2015-10-04 DIAGNOSIS — C541 Malignant neoplasm of endometrium: Secondary | ICD-10-CM | POA: Diagnosis not present

## 2015-10-04 DIAGNOSIS — R599 Enlarged lymph nodes, unspecified: Secondary | ICD-10-CM

## 2015-10-04 DIAGNOSIS — R4589 Other symptoms and signs involving emotional state: Secondary | ICD-10-CM

## 2015-10-04 DIAGNOSIS — F418 Other specified anxiety disorders: Secondary | ICD-10-CM

## 2015-10-04 DIAGNOSIS — R590 Localized enlarged lymph nodes: Secondary | ICD-10-CM

## 2015-10-04 DIAGNOSIS — T451X5A Adverse effect of antineoplastic and immunosuppressive drugs, initial encounter: Secondary | ICD-10-CM

## 2015-10-04 LAB — COMPREHENSIVE METABOLIC PANEL
ALT: 13 U/L (ref 0–55)
AST: 20 U/L (ref 5–34)
Albumin: 4 g/dL (ref 3.5–5.0)
Alkaline Phosphatase: 50 U/L (ref 40–150)
Anion Gap: 7 mEq/L (ref 3–11)
BUN: 14.2 mg/dL (ref 7.0–26.0)
CO2: 26 mEq/L (ref 22–29)
Calcium: 9.7 mg/dL (ref 8.4–10.4)
Chloride: 108 mEq/L (ref 98–109)
Creatinine: 0.8 mg/dL (ref 0.6–1.1)
EGFR: 83 mL/min/{1.73_m2} — ABNORMAL LOW (ref >90)
Glucose: 96 mg/dl (ref 70–140)
Potassium: 3.9 mEq/L (ref 3.5–5.1)
Sodium: 141 mEq/L (ref 136–145)
Total Bilirubin: 0.5 mg/dL (ref 0.20–1.20)
Total Protein: 7 g/dL (ref 6.4–8.3)

## 2015-10-04 LAB — CBC WITH DIFFERENTIAL/PLATELET
BASO%: 1.3 % (ref 0.0–2.0)
Basophils Absolute: 0 10*3/uL (ref 0.0–0.1)
EOS%: 3.7 % (ref 0.0–7.0)
Eosinophils Absolute: 0.1 10*3/uL (ref 0.0–0.5)
HCT: 36.9 % (ref 34.8–46.6)
HGB: 12.3 g/dL (ref 11.6–15.9)
LYMPH%: 23.5 % (ref 14.0–49.7)
MCH: 31.7 pg (ref 25.1–34.0)
MCHC: 33.4 g/dL (ref 31.5–36.0)
MCV: 94.8 fL (ref 79.5–101.0)
MONO#: 0.2 10*3/uL (ref 0.1–0.9)
MONO%: 8.8 % (ref 0.0–14.0)
NEUT#: 1.6 10*3/uL (ref 1.5–6.5)
NEUT%: 62.7 % (ref 38.4–76.8)
Platelets: 131 10*3/uL — ABNORMAL LOW (ref 145–400)
RBC: 3.89 10*6/uL (ref 3.70–5.45)
RDW: 12.1 % (ref 11.2–14.5)
WBC: 2.5 10*3/uL — ABNORMAL LOW (ref 3.9–10.3)
lymph#: 0.6 10*3/uL — ABNORMAL LOW (ref 0.9–3.3)

## 2015-10-04 LAB — TSH: TSH: 2.121 m(IU)/L (ref 0.308–3.960)

## 2015-10-04 MED ORDER — ALPRAZOLAM 0.25 MG PO TABS
0.2500 mg | ORAL_TABLET | Freq: Three times a day (TID) | ORAL | Status: DC | PRN
Start: 2015-10-04 — End: 2016-05-09

## 2015-10-04 NOTE — Telephone Encounter (Signed)
appt made and avs printed °

## 2015-10-04 NOTE — Progress Notes (Signed)
OFFICE PROGRESS NOTE   Oct 05, 2015   Physicians: Genia Del, MD, Gery Pray MD, [name] MD New patient referral Dr Reynold Bowen  INTERVAL HISTORY:  Patient is seen, alone for visit, in continuing attention to recurrent endometrial carcinoma, having begun tamoxifen on 09-10-15 which we plan to alternate every 3 weeks with megace. She has held tamoxifen for past week due to upcoming trip to [place] 5-3 thru 10-20-15, but will resume a few days after she returns from that trip.   US biopsy of PET hypermetabolic thyroid nodule 3-42-87 ([id number]) benign thyroid tissue. Referral made to Dr Forde Dandy, first available in Sept tho may have cancellation prior. Patient saw Dr Sondra Come on 09-23-15, with review of PET from 08-31-15; he would be able to do radiation to the right paratracheal node if needed. Case with discussed at gyn oncology multidisciplinary conference on 09-27-15. Systemic treatment recommened over local radiation due to likelihood of micrometastatic disease elsewhere. Consider repeat imaging in ~ 3 months, possible RT to the paratracheal node if still isolated.  Per patient, case was also presented at thoracic conference recently. The paratracheal node would be amenable to biopsy if needed.   Patient has been anxious about medical situation as well as the upcoming overseas trip. She had near panic attack on 09-22-15, took one xanax with improvement. She is not sleeping well and appetite not good due to anxiety, no other GI symptoms. She used xanax for similar symptoms ~ 15 years ago. The anxiety has not improved in past week since she has held tamoxifen. She had some increase in hot flashes on tamoxifen, which were tolerable. She denies SOB, pain, difficulty swallowing, abdominal or pelvic discomfort. Bowels seem at baseline. No LE swelling. No fever or symptoms of infection. No bleeding. Bladder ok.  Remainder of 10 point Review of Systems negative.     No central catheter.  No  genetics testing CA 125 132 on 11-27-14. CA 125 baseline for tamoxifen/ megace 37.8 on 09-09-15  She saw Dr [name] recently, no changes. Dr Sabra Heck reviewed outside records at that visit per patient.    ONCOLOGIC HISTORY Patient presented to PCP with intermittent vaginal bleeding since ~ Oct 2013, endometrial biopsy 06-20-12 with complex endometrial hyperplasia with atypia. Pelvic and transvaginal US in [place] system on 06-26-12 showed endometrial thickening of 2.7 cm, right ovary not clearly seen and left ovary with normal postmenopausal appearance. She was seen by Dr Christophe Louis and referred to Dr Polly Cobia. She had robotic hysterectomy with BSO and bilateral pelvic and periaortic node dissection by Dr Polly Cobia at [facility] on 07-30-2012. Intraoperatively she had large endometrial polyp with high grade malignancy, no obvious extrauterine disease. Pathology ([facility] [place] [id number] from 07-30-2012) had high grade, poorly differentiated endometrial adenocarcinoma with solid and serous papillary components, 2 periaortic nodes negative and 9 right pelvic/11 left pelvic nodes negative. The tumor was 4.8 cm confined to polyp with no definite myometrial involvement, margins clear and no angiolymphatic invasion identified.  Cycle 1 taxol carboplatin was given 08-30-12, 6 cycles of q 3 week regimen given thru 12-13-2012, with gCSF support. Radiation was intracavitary HDR to proximal vagina 30 Gy in 5 fractions from May 27 thru November 27, 2012, by Dr Sondra Come. Patient was followed closely by Dr Polly Cobia after completing adjuvant taxol carboplatin chemotherapy in 12-2012. Patient felt very well, with intentional 70 lb weight loss over a year from Jan thru Dec 2015 on [employer]. She saw Dr Polly Cobia with unremarkable  exam in March 2016, however CA 125 was slightly elevated at 36.3, up from previous 29.2. She had CT CAP with contrast 10-16-14 with new left periaortic and left  common iliac lymphadenopathy. PET 6-72-09 had hypermetabolic uptake in retroperitoneal nodes from lower poles of kidneys to aortic bifurcation,largest 2.0 x 2.1 cm, as well as uptake in either right supraclavicular node vs right lobe of thyroid (report below). FNA of thyroid 10-28-14 with benign tissue, [place] at time of biopsy reportedly showing no right supraclavicular adenopathy. She had CT needle biopsy of left para-aortic node 11-16-14 with path showing serous carcinoma. She began chemotherapy with dose dense carbo taxol on 11-27-14, with carbo at AUC = 5. Over next 3 weeks WBC and platelets were too low for treatments, despite total 7 doses of granix. She had taxol only cycle 2, then [name] at AUC=2 + taxol cycle 3. Counts maintained with increase in gCSF to 480 mcg 3-4 doses after each treatment, including increase in [name] for third dose to AUC =3 on 02-11-15. PET 03-01-15 had uptake in one left peri-aortic node and the area of previous benign biopsy right lobe thyroid. She received IMRT, summary as follows:  Diagnosis: Recurrent Endometrial cancer (high-grade serous adenocarcinoma) with metastasis to the retroperitoneal lymph nodes  Indication for treatment: Isolated recurrence in this region, definitive treatment  Radiation treatment dates: October 31 through December 8 Site/dose: PET positive retroperitoneal nodes 50.4 gray in 28 fractions with simultaneous integrated boost to 56 gray CT AP 06-17-15 showed no apparent residual or progressive disease. PET 08-31-15, compared with 03-01-15 and CT 06-17-15, showed no findings of concern for persistent or progressive disease in abdomen or pelvis. There is same hypermetabolic right thyroid nodule (2.8 cm with SUV 10.4 compared with 2.7 cm with SUV 10 on prior) and new right paratracheal adenopathy with node 2.3 cm and SUV 17.7.  Surgical path from 07-30-12 tested for ER PR on 09-06-15, with ER + intermediate in 25-35% and PR + strong in 25-35%.   Biopsy of thyroid nodule benign 09-20-15.      Objective:  Vital signs in last 24 hours:  BP 146/80 mmHg  Pulse 65  Temp(Src) 98.1 F (36.7 C) (Oral)  Resp 17  Ht 5' 3"  (1.6 m)  Wt 143 lb 11.2 oz (65.182 kg)  BMI 25.46 kg/m2  SpO2 98% Weight down 5 lbs. Appears a little more anxious than usual. Respirations not labored RA.  Alert, oriented and appropriate. Ambulatory without difficulty.  No alopecia  HEENT:PERRL, sclerae not icteric. Oral mucosa moist without lesions, posterior pharynx clear.  Neck supple. No JVD.  Lymphatics:no cervical,supraclavicular, axillary or inguinal adenopathy Resp: clear to auscultation bilaterally and normal percussion bilaterally Cardio: regular rate and rhythm. No gallop. GI: soft, nontender, not distended, no mass or organomegaly. Normally active bowel sounds. Surgical incision not remarkable. Musculoskeletal/ Extremities: without pitting edema, cords, tenderness. Some kyphosis stable. Neuro: no peripheral neuropathy. No tremor. Otherwise nonfocal Skin without rash, ecchymosis, petechiae. Not diaphoretic.   Lab Results:  Results for orders placed or performed in visit on 10/04/15  TSH  Result Value Ref Range   TSH 2.121 0.308 - 3.960 m(IU)/L  T3 uptake  Result Value Ref Range   T3 Uptake Ratio 25 24 - 39 %   Free Thyroxine Index 2.4 1.2 - 4.9  T4  Result Value Ref Range   Thyroxine (T4) 9.5 4.5 - 12.0 ug/dL  CBC with Differential  Result Value Ref Range   WBC 2.5 (L) 3.9 - 10.3 10e3/uL  NEUT# 1.6 1.5 - 6.5 10e3/uL   HGB 12.3 11.6 - 15.9 g/dL   HCT 36.9 34.8 - 46.6 %   Platelets 131 (L) 145 - 400 10e3/uL   MCV 94.8 79.5 - 101.0 fL   MCH 31.7 25.1 - 34.0 pg   MCHC 33.4 31.5 - 36.0 g/dL   RBC 3.89 3.70 - 5.45 10e6/uL   RDW 12.1 11.2 - 14.5 %   lymph# 0.6 (L) 0.9 - 3.3 10e3/uL   MONO# 0.2 0.1 - 0.9 10e3/uL   Eosinophils Absolute 0.1 0.0 - 0.5 10e3/uL   Basophils Absolute 0.0 0.0 - 0.1 10e3/uL   NEUT% 62.7 38.4 - 76.8 %    LYMPH% 23.5 14.0 - 49.7 %   MONO% 8.8 0.0 - 14.0 %   EOS% 3.7 0.0 - 7.0 %   BASO% 1.3 0.0 - 2.0 %  Comprehensive metabolic panel  Result Value Ref Range   Sodium 141 136 - 145 mEq/L   Potassium 3.9 3.5 - 5.1 mEq/L   Chloride 108 98 - 109 mEq/L   CO2 26 22 - 29 mEq/L   Glucose 96 70 - 140 mg/dl   BUN 14.2 7.0 - 26.0 mg/dL   Creatinine 0.8 0.6 - 1.1 mg/dL   Total Bilirubin 0.50 0.20 - 1.20 mg/dL   Alkaline Phosphatase 50 40 - 150 U/L   AST 20 5 - 34 U/L   ALT 13 0 - 55 U/L   Total Protein 7.0 6.4 - 8.3 g/dL   Albumin 4.0 3.5 - 5.0 g/dL   Calcium 9.7 8.4 - 10.4 mg/dL   Anion Gap 7 3 - 11 mEq/L   EGFR 83 (L) >90 ml/min/1.73 m2     Studies/Results: FINAL for Diane, Lozano ([id number]) Patient: Diane, SCHMIERER Lozano Collected: 09/20/2015 Client: [facility] Accession: [id number] Received: 09/20/2015 Rowe Robert, PA-C CYTOPATHOLOGY REPORT Adequacy Reason Satisfactory For Evaluation. Diagnosis THYROID, RIGHT, FINE NEEDLE ASPIRATION (SPECIMEN 1 OF 1 COLLECTED 09/20/15): FINDINGS CONSISTENT WITH BENIGN THYROID NODULE (BETHESDA CATEGORY II). SEE COMMENT. Willeen Niece MD Pathologist, Electronic Signature (Case signed 09/21/2015) Specimen Clinical Information endometrial cancer  Comment There is a prominent population of Hurthle cells with a background of abundant colloid (colloid visualized on Pap stained slides). Macrofollicular architecture is present in many of the groups. The findings are consistent with the above diagnosis.    Medications: I have reviewed the patient's current medications. Prescription given for xanax 0.25 mg to use sparingly for significant anxiety.   DISCUSSION Reviewed information from biopsy of thyroid nodule. We will let her know results of TFTs pending at time of visit, that information also to Drs Sabra Heck and [place]. We are relieved that the biopsy did not show anything of concern, still appreciate Dr Forde Dandy seeing her in consultation.  Discussed  recommendations and considerations from gyn onc [place] and thoracic conference. As we have already documented recurrent endometrial cancer (CT bx left paraaortic node Rondall Allegra 11-2014), and as no other findings of concern for a different primary, she does not need biopsy of the paratracheal node now. We will be able to measure the paratracheal node on next scans, which will help in monitoring response to the tamoxifen/ megace. She understands that we do not expect to cure this recurrent malignancy, with goal to control disease with minimal side effects and symptoms possible.  Discussed rationale for systemic therapy as next intervention. Discussed choice of hormonal blocker given + ER PR on initial tumor, low and asymptomatic tumor volume now and significant cytopenias with  prior chemo. Discussed timing of the tamoxifen and megace avoiding the long plane flights. Discussed side effects of the tamoxifen and megace, including appetite stimulation and hot flashes, which generally are more tolerable given the alternating 3 week treatments. Reminded patient that Dr Polly Cobia was comfortable waiting until after her trip even to begin this treatment, tho we started earlier as patient was anxious about waiting another several weeks. Recommended that she resume the last week of first 3 weeks tamoxifen a few days after she returns from the trip. I will see her back in late May to follow up.   Xanax as above.   [place] chaplain has been in contact with patient prior to today's visit - thank you.  Assessment/Plan:   1.recurrent high grade serous endometrial carcinoma involving para-aortic and common iliac nodes: ~ 22 months from completion of adjuvant chemotherapy for IA poorly differentiated endometrial adenocarcinoma. Even dose reduced carboplatin and weekly taxol with granix was difficult due to low counts; PET after initial chemo only uptake in a left periaortic node (+thyroid where previously biopsied). Completed  IMRT to retroperitoneal nodes 05-13-15. CT AP 06-17-15 no apparent residual or progressive disease. With some increase in CA125, PET obtained 08-31-15 with uptake right thyroid and paratracheal node, none in AP. Plan alternating tamoxifen and megace. 2.Right thyroid nodule: hypermetabolic on PET, biopsy in [facility] system 10-2014 no malignancy. Repeat US biopsy by IR 09-20-15 also benign. TFTs normal after visit today. Consultation requested with Dr Forde Dandy. With normal TFTs, does not seem that anxiety episodes or palpitations likely from thyroid.   3.right paratracheal node(s) new from last scans and hypermetabolic on PET 6-77-03. Plan to follow on the tamoxifen/ megace for now 4.intentional weight loss of 70 lbs in 2015.  Hypoglycemia intermittently by labs during treatment. Decreased appetite likely with anxiety, Megace likely will help (also food in [place]) 5.chronic constipation, which was a concern during chemotherapy. Up to date on colonoscopy. 6..BP good without antihypertensives now, follow. Likely improved with weight loss  7..elevated lipids 8.remote minimal tobacco, not enough to qualify for the lung cancer screening program 9.up to date mammograms 10. flu vaccine 02-23-15 11.possibly low bone density, tho I do not find DEXA in this EMR 12.situational anxiety. Does not seem related to thyroid based on normal TFTs. Prn xanax for upcoming trip   All questions answered and patient seems reassured by discussion now. Time spent 30 min including >50% counseling and coordination of care. Route Dr Sabra Heck. CC Drs Genevie Cheshire, Shirlean Mylar, MD   10/05/2015, 1:59 PM

## 2015-10-05 ENCOUNTER — Telehealth: Payer: Self-pay

## 2015-10-05 DIAGNOSIS — R590 Localized enlarged lymph nodes: Secondary | ICD-10-CM | POA: Insufficient documentation

## 2015-10-05 DIAGNOSIS — E041 Nontoxic single thyroid nodule: Secondary | ICD-10-CM | POA: Insufficient documentation

## 2015-10-05 DIAGNOSIS — F418 Other specified anxiety disorders: Secondary | ICD-10-CM | POA: Insufficient documentation

## 2015-10-05 LAB — T4: Thyroxine (T4): 9.5 ug/dL (ref 4.5–12.0)

## 2015-10-05 LAB — T3 UPTAKE
Free Thyroxine Index: 2.4 (ref 1.2–4.9)
T3 Uptake Ratio: 25 % (ref 24–39)

## 2015-10-05 NOTE — Telephone Encounter (Signed)
Judeen Hammans called stating that Dr. Forde Dandy had some days open on his schedule.  She called Ms. Kerchner and set up an appointment for May 19,2017 at 1530. Will inform Dr. Marko Plume of this appointment.

## 2015-10-05 NOTE — Telephone Encounter (Signed)
Told Diane Lozano the results of the TFTs as noted below. Diane Lozano verbalized understanding.

## 2015-10-05 NOTE — Telephone Encounter (Signed)
-----   Message from [name], MD sent at 10/05/2015  8:49 AM EDT ----- Labs seen and need follow up  Please let her know TFTs normal - I will send to PCP

## 2015-10-22 ENCOUNTER — Other Ambulatory Visit: Payer: Self-pay | Admitting: Endocrinology

## 2015-10-22 DIAGNOSIS — C541 Malignant neoplasm of endometrium: Secondary | ICD-10-CM | POA: Diagnosis not present

## 2015-10-22 DIAGNOSIS — E041 Nontoxic single thyroid nodule: Secondary | ICD-10-CM | POA: Diagnosis not present

## 2015-10-22 DIAGNOSIS — Z6825 Body mass index (BMI) 25.0-25.9, adult: Secondary | ICD-10-CM | POA: Diagnosis not present

## 2015-10-22 DIAGNOSIS — Z1389 Encounter for screening for other disorder: Secondary | ICD-10-CM | POA: Diagnosis not present

## 2015-10-27 ENCOUNTER — Other Ambulatory Visit: Payer: Self-pay | Admitting: Oncology

## 2015-10-28 ENCOUNTER — Encounter: Payer: Self-pay | Admitting: Oncology

## 2015-10-28 ENCOUNTER — Encounter: Payer: Self-pay | Admitting: General Practice

## 2015-10-28 ENCOUNTER — Other Ambulatory Visit (HOSPITAL_BASED_OUTPATIENT_CLINIC_OR_DEPARTMENT_OTHER): Payer: 59

## 2015-10-28 ENCOUNTER — Telehealth: Payer: Self-pay | Admitting: Oncology

## 2015-10-28 ENCOUNTER — Ambulatory Visit (HOSPITAL_BASED_OUTPATIENT_CLINIC_OR_DEPARTMENT_OTHER): Payer: 59 | Admitting: Oncology

## 2015-10-28 VITALS — BP 144/72 | HR 79 | Temp 98.6°F | Resp 18 | Ht 63.0 in | Wt 150.9 lb

## 2015-10-28 DIAGNOSIS — E041 Nontoxic single thyroid nodule: Secondary | ICD-10-CM | POA: Diagnosis not present

## 2015-10-28 DIAGNOSIS — C772 Secondary and unspecified malignant neoplasm of intra-abdominal lymph nodes: Secondary | ICD-10-CM | POA: Diagnosis not present

## 2015-10-28 DIAGNOSIS — Z79899 Other long term (current) drug therapy: Secondary | ICD-10-CM

## 2015-10-28 DIAGNOSIS — C541 Malignant neoplasm of endometrium: Secondary | ICD-10-CM

## 2015-10-28 DIAGNOSIS — R590 Localized enlarged lymph nodes: Secondary | ICD-10-CM

## 2015-10-28 LAB — COMPREHENSIVE METABOLIC PANEL
ALT: 12 U/L (ref 0–55)
AST: 16 U/L (ref 5–34)
Albumin: 3.6 g/dL (ref 3.5–5.0)
Alkaline Phosphatase: 67 U/L (ref 40–150)
Anion Gap: 8 mEq/L (ref 3–11)
BUN: 21.1 mg/dL (ref 7.0–26.0)
CO2: 25 mEq/L (ref 22–29)
Calcium: 8.9 mg/dL (ref 8.4–10.4)
Chloride: 107 mEq/L (ref 98–109)
Creatinine: 0.8 mg/dL (ref 0.6–1.1)
EGFR: 84 mL/min/{1.73_m2} — ABNORMAL LOW (ref >90)
Glucose: 105 mg/dl (ref 70–140)
Potassium: 3.9 mEq/L (ref 3.5–5.1)
Sodium: 141 mEq/L (ref 136–145)
Total Bilirubin: 0.38 mg/dL (ref 0.20–1.20)
Total Protein: 6.5 g/dL (ref 6.4–8.3)

## 2015-10-28 LAB — CBC WITH DIFFERENTIAL/PLATELET
BASO%: 0.9 % (ref 0.0–2.0)
Basophils Absolute: 0 10*3/uL (ref 0.0–0.1)
EOS%: 4.3 % (ref 0.0–7.0)
Eosinophils Absolute: 0.1 10*3/uL (ref 0.0–0.5)
HCT: 35.4 % (ref 34.8–46.6)
HGB: 11.9 g/dL (ref 11.6–15.9)
LYMPH%: 16.7 % (ref 14.0–49.7)
MCH: 32.3 pg (ref 25.1–34.0)
MCHC: 33.6 g/dL (ref 31.5–36.0)
MCV: 96.2 fL (ref 79.5–101.0)
MONO#: 0.2 10*3/uL (ref 0.1–0.9)
MONO%: 5.9 % (ref 0.0–14.0)
NEUT#: 2.3 10*3/uL (ref 1.5–6.5)
NEUT%: 72.2 % (ref 38.4–76.8)
Platelets: 156 10*3/uL (ref 145–400)
RBC: 3.68 10*6/uL — ABNORMAL LOW (ref 3.70–5.45)
RDW: 12.7 % (ref 11.2–14.5)
WBC: 3.2 10*3/uL — ABNORMAL LOW (ref 3.9–10.3)
lymph#: 0.5 10*3/uL — ABNORMAL LOW (ref 0.9–3.3)

## 2015-10-28 NOTE — Telephone Encounter (Signed)
appt made and avs printed °

## 2015-10-28 NOTE — Progress Notes (Signed)
Spiritual Care Note  Met for >1 hour of spiritual direction and emotional support to f/u after Diane Lozano's recent trip to [place].  She reports decreased anxiety, due both to xanax in her pocket (tool for relief if she needs it) and especially to her change in perspective.  She articulates that she has been able to move from panic about px and mortality to recognizing that death happens to [place] all, and thus cancer is serving as a reminder that she can choose how she will live now.  This has freed her to attend to the present moment with more joy and less distress.  [name] shared in detail about her trip to [place] and ways in which it enlarged her perspective and brought beauty and peace.  We explored ways that using imagery (basilicas, landscapes) and sensations (peace in a cathedral) from her trip can enhance her prayer and meditation/centering now that she is home.  Provided pastoral reflection, empathic listening, and suggestions of spiritual practices and writings that may help enhance her growth.  She is finding these spiritual direction sessions very helpful and plans to reach out when she is ready for another step in growth and reflection.  Please also page as needs arise/circumstances change.  Thank you.  [place], [place], [facility] Pager [phone] Voicemail [phone]

## 2015-10-28 NOTE — Progress Notes (Signed)
OFFICE PROGRESS NOTE   Oct 28, 2015  Physicians: Genia Del, Gery Pray , Sharren Bridge  INTERVAL HISTORY:   Patient is seen, alone for visit, in continuing attention to recurrent endometrial carcinoma, recently having begun treatment with tamoxifen/ megace; surgical path from 07-2012 ER PR +. She finishes first 3 weeks of tamoxifen today (held during recent trip to [place]) and will begin megace on 10-29-15.  She has appointment with Dr Polly Cobia on 11-15-15.  She had consultation with Dr Forde Dandy 10-22-15 re thyroid findings, with + PET and negative thyroid biopsies 10-28-14 and 09-20-15. He will see her again in 4 months (office note scanned into this EMR).   She had a wonderful trip to [place], which has helped emotionally. She has tolerated tamoxifen with no difficulty and has no symptoms from the metastatic endometrial cancer identified on PET 08-31-15. Only new problem has been hives on face and neck which began prior to starting tamoxifen and were more bothersome when she was in [place]; she is to see dermatologist on 10-29-15.  She denies any different makeup, soap, hair products. Otherwise no respiratory symptoms, appetite excellent on the trip, bowels fine, no bleeding. No new or different pain, no LE swelling, no hot flashes, no neurologic symptoms.  Remainder of 10 point Review of Systems negative/ unchanged.   No central catheter.  No genetics testing CA 125 132 on 11-27-14. CA 125 baseline for tamoxifen/ megace 37.8 on 09-09-15  ONCOLOGIC HISTORY Patient presented to PCP with intermittent vaginal bleeding since ~ Oct 2013, endometrial biopsy 06-20-12 with complex endometrial hyperplasia with atypia. Pelvic and transvaginal US in [facility] system on 06-26-12 showed endometrial thickening of 2.7 cm, right ovary not clearly seen and left ovary with normal postmenopausal appearance. She was seen by Dr Christophe Louis and referred to Dr Polly Cobia. She had robotic hysterectomy with BSO and  bilateral pelvic and periaortic node dissection by Dr Polly Cobia at [facility] on 07-30-2012. Intraoperatively she had large endometrial polyp with high grade malignancy, no obvious extrauterine disease. Pathology ([facility] [place] [id number] from 07-30-2012) had high grade, poorly differentiated endometrial adenocarcinoma with solid and serous papillary components, 2 periaortic nodes negative and 9 right pelvic/11 left pelvic nodes negative. The tumor was 4.8 cm confined to polyp with no definite myometrial involvement, margins clear and no angiolymphatic invasion identified.  Cycle 1 taxol carboplatin was given 08-30-12, 6 cycles of q 3 week regimen given thru 12-13-2012, with gCSF support. Radiation was intracavitary HDR to proximal vagina 30 Gy in 5 fractions from May 27 thru November 27, 2012, by Dr Sondra Come. Patient was followed closely by Dr Polly Cobia after completing adjuvant taxol carboplatin chemotherapy in 12-2012. Patient felt very well, with intentional 70 lb weight loss over a year from Jan thru Dec 2015 on [employer]. She saw Dr Polly Cobia with unremarkable exam in March 2016, however CA 125 was slightly elevated at 36.3, up from previous 29.2. She had CT CAP with contrast 10-16-14 with new left periaortic and left common iliac lymphadenopathy. PET 0-76-80 had hypermetabolic uptake in retroperitoneal nodes from lower poles of kidneys to aortic bifurcation,largest 2.0 x 2.1 cm, as well as uptake in either right supraclavicular node vs right lobe of thyroid (report below). FNA of thyroid 10-28-14 with benign tissue, [place] at time of biopsy reportedly showing no right supraclavicular adenopathy. She had CT needle biopsy of left para-aortic node 11-16-14 with path showing serous carcinoma. She began chemotherapy with dose dense carbo taxol on 11-27-14,  with carbo at AUC = 5. Over next 3 weeks WBC and platelets were too low for treatments, despite total 7 doses of granix. She had  taxol only cycle 2, then [name] at AUC=2 + taxol cycle 3. Counts maintained with increase in gCSF to 480 mcg 3-4 doses after each treatment, including increase in [name] for third dose to AUC =3 on 02-11-15. PET 03-01-15 had uptake in one left peri-aortic node and the area of previous benign biopsy right lobe thyroid. She received IMRT, summary as follows:  Diagnosis: Recurrent Endometrial cancer (high-grade serous adenocarcinoma) with metastasis to the retroperitoneal lymph nodes  Indication for treatment: Isolated recurrence in this region, definitive treatment  Radiation treatment dates: October 31 through December 8 Site/dose: PET positive retroperitoneal nodes 50.4 gray in 28 fractions with simultaneous integrated boost to 56 gray CT AP 06-17-15 showed no apparent residual or progressive disease. PET 08-31-15, compared with 03-01-15 and CT 06-17-15, showed no findings of concern for persistent or progressive disease in abdomen or pelvis. There is same hypermetabolic right thyroid nodule (2.8 cm with SUV 10.4 compared with 2.7 cm with SUV 10 on prior) and new right paratracheal adenopathy with node 2.3 cm and SUV 17.7.  Surgical path from 07-30-12 tested for ER PR on 09-06-15, with ER + intermediate in 25-35% and PR + strong in 25-35%.  Biopsy of thyroid nodule benign 09-20-15.  She began tamoxifen/ megace 09-2015 (break during first 3 weeks of tamoxifen for trip overseas).   Objective:  Vital signs in last 24 hours:  BP 144/72 mmHg  Pulse 79  Temp(Src) 98.6 F (37 C) (Oral)  Resp 18  Ht _0  (1.6 m)  Wt 150 lb 14.4 oz (68.448 kg)  BMI 26.74 kg/m2  SpO2 99% Weight is up 7 lbs. Alert, oriented and appropriate. Ambulatory without difficulty, looks comfortable. No alopecia  HEENT:PERRL, sclerae not icteric. Oral mucosa moist without lesions, posterior pharynx clear.  Neck supple. No JVD.  Lymphatics:no cervical,supraclavicular or inguinal adenopathy Resp: clear to auscultation  bilaterally and normal percussion bilaterally Cardio: regular rate and rhythm. No gallop. GI: soft, nontender, not distended, no mass or organomegaly. Normally active bowel sounds. Surgical incision not remarkable. Musculoskeletal/ Extremities: Mild kyphosis unchanged. LE without pitting edema, cords, tenderness Neuro: no tremor, otherwise nonfocal Skin nonpalpable erythema patchy anterior neck, other areas on face not obvious with makeup. Otherwise without rash, ecchymosis, petechiae   Lab Results:  Results for orders placed or performed in visit on 10/28/15  CBC with Differential  Result Value Ref Range   WBC 3.2 (L) 3.9 - 10.3 10e3/uL   NEUT# 2.3 1.5 - 6.5 10e3/uL   HGB 11.9 11.6 - 15.9 g/dL   HCT 35.4 34.8 - 46.6 %   Platelets 156 145 - 400 10e3/uL   MCV 96.2 79.5 - 101.0 fL   MCH 32.3 25.1 - 34.0 pg   MCHC 33.6 31.5 - 36.0 g/dL   RBC 3.68 (L) 3.70 - 5.45 10e6/uL   RDW 12.7 11.2 - 14.5 %   lymph# 0.5 (L) 0.9 - 3.3 10e3/uL   MONO# 0.2 0.1 - 0.9 10e3/uL   Eosinophils Absolute 0.1 0.0 - 0.5 10e3/uL   Basophils Absolute 0.0 0.0 - 0.1 10e3/uL   NEUT% 72.2 38.4 - 76.8 %   LYMPH% 16.7 14.0 - 49.7 %   MONO% 5.9 0.0 - 14.0 %   EOS% 4.3 0.0 - 7.0 %   BASO% 0.9 0.0 - 2.0 %  Comprehensive metabolic panel  Result Value Ref Range  Sodium 141 136 - 145 mEq/L   Potassium 3.9 3.5 - 5.1 mEq/L   Chloride 107 98 - 109 mEq/L   CO2 25 22 - 29 mEq/L   Glucose 105 70 - 140 mg/dl   BUN 21.1 7.0 - 26.0 mg/dL   Creatinine 0.8 0.6 - 1.1 mg/dL   Total Bilirubin 0.38 0.20 - 1.20 mg/dL   Alkaline Phosphatase 67 40 - 150 U/L   AST 16 5 - 34 U/L   ALT 12 0 - 55 U/L   Total Protein 6.5 6.4 - 8.3 g/dL   Albumin 3.6 3.5 - 5.0 g/dL   Calcium 8.9 8.4 - 10.4 mg/dL   Anion Gap 8 3 - 11 mEq/L   EGFR 84 (L) >90 ml/min/1.73 m2  CA 125  Result Value Ref Range   Cancer Antigen (CA) 125 53.2 (H) 0.0 - 38.1 U/mL    CA 125 above available after visit, had been 37.8 on 09-09-15, 37.5 on 08-12-15 and 24 on  06-17-15/  Studies/Results:  No results found.  Medications: I have reviewed the patient's current medications.  DISCUSSION Interval history above reviewed, including information from Dr Forde Dandy concerning thyroid.  Patient is asking if trials might be available, wonders about consultation in [place], which certainly can be set up if she would like. I have suggested sending [facility] testing: she will also discuss with Dr Polly Cobia at upcoming visit. For present she is in agreement with continuing tamoxifen/ megace.  Following visit I have spoken with [facility] in [place], order requesting [facility] testing on surgical path from 07-31-12 to be faxed to them.    Assessment/Plan:   1.recurrent high grade serous endometrial carcinoma involving para-aortic and common iliac nodes: ~ 22 months from completion of adjuvant chemotherapy for [place] poorly differentiated endometrial adenocarcinoma, ER PR +. Even dose reduced carboplatin and weekly taxol with granix was difficult due to low counts; PET after initial chemo only uptake in a left periaortic node (+thyroid where previously biopsied). Completed IMRT to retroperitoneal nodes 05-13-15. CT AP 06-17-15 no apparent residual or progressive disease. With some increase in CA125, PET obtained 08-31-15 with uptake right thyroid and paratracheal node, none in AP. Began alternating tamoxifen and megace 09-2015, completing first 3 weeks of tamoxifen 10-28-15. Not surprising that there is no improvement in marker as yet. Will request [facility] information. She will see Dr Polly Cobia 11-15-15. I will see her back in early July with labs.  2.Right thyroid nodule: hypermetabolic on PET, biopsy in [facility] system 10-2014 no malignancy. Repeat US biopsy by IR 09-20-15 also benign. TFTs normal. Appreciate Dr Forde Dandy seeing her.  3.right paratracheal node(s) new from last scans and hypermetabolic on PET 07-28-95. Plan to follow on the  tamoxifen/ megace for now 4.intentional weight loss of 70 lbs in 2015. Hypoglycemia intermittently by labs during treatment. Appetite much better in [place]! 5.chronic constipation, which was a concern during chemotherapy. Up to date on colonoscopy. 6..BP good without antihypertensives now, follow. Likely improved with weight loss  7..elevated lipids 8.remote minimal tobacco, not enough to qualify for the lung cancer screening program 9.up to date mammograms 10. flu vaccine 02-23-15 11.possibly low bone density, tho I do not find DEXA in this EMR 12.situational anxiety. Does not seem related to thyroid based on normal TFTs. Did not need to use prn xanax on recent trip 89.Hives face and neck: seems likely some contact allergy,preceded tamoxifen (which would not be likely etiology even so),  to see dermatologist  All questions  answered and she knows to call if concerns prior to next scheduled visit.  We will let her know CA 125 information and that [facility] testing expected.  Route PCP, cc Drs Polly Cobia, [place], [place]. Time spent 25 min including >50% counseling and coordination of care.    [name], MD

## 2015-10-29 ENCOUNTER — Encounter: Payer: Self-pay | Admitting: Oncology

## 2015-10-29 ENCOUNTER — Telehealth: Payer: Self-pay | Admitting: Oncology

## 2015-10-29 ENCOUNTER — Telehealth: Payer: Self-pay

## 2015-10-29 DIAGNOSIS — L219 Seborrheic dermatitis, unspecified: Secondary | ICD-10-CM | POA: Diagnosis not present

## 2015-10-29 DIAGNOSIS — Z872 Personal history of diseases of the skin and subcutaneous tissue: Secondary | ICD-10-CM | POA: Diagnosis not present

## 2015-10-29 LAB — CA 125: Cancer Antigen (CA) 125: 53.2 U/mL — ABNORMAL HIGH (ref 0.0–38.1)

## 2015-10-29 MED FILL — TAMOXIFEN 20 MG TABLET: 20 | 21 days supply | Qty: 42 | Fill #1

## 2015-10-29 MED FILL — KETOCONAZOLE 2% CREAM: 2 | 5 days supply | Qty: 15 | Fill #0

## 2015-10-29 NOTE — Telephone Encounter (Signed)
-----   Message from [name], MD sent at 10/29/2015 12:08 PM EDT ----- Juliann Pulse - could you please print letter done 5-26 for path testing, so I can sign and it can be faxed to [facility] fax [phone]  Ok to do this today or next week   thanks

## 2015-10-29 NOTE — Telephone Encounter (Signed)
Letter faxed.

## 2015-10-29 NOTE — Telephone Encounter (Signed)
Medical Oncology  Spoke with [facility] [phone] in [place], to request [facility] testing on surgical path [id number] from 07-31-2012.  They request order be faxed to [phone]  Letter done as order, requested HIM fax this as above.  Godfrey Pick, MD

## 2015-10-31 DIAGNOSIS — Z79899 Other long term (current) drug therapy: Secondary | ICD-10-CM | POA: Insufficient documentation

## 2015-11-02 ENCOUNTER — Telehealth: Payer: Self-pay

## 2015-11-02 NOTE — Telephone Encounter (Signed)
5/26 faxed letter to [facility] for [facility] testing to be done on specimen. PDL called back and said the block was at [facility]. Obtained [facility] requisition form and filled it out. 5/30 Faxed requisition to [facility], then faxed requisition to [facility].

## 2015-11-03 ENCOUNTER — Telehealth: Payer: Self-pay

## 2015-11-03 DIAGNOSIS — C541 Malignant neoplasm of endometrium: Secondary | ICD-10-CM | POA: Diagnosis not present

## 2015-11-03 NOTE — Telephone Encounter (Signed)
-----   Message from [name], MD sent at 10/31/2015  2:00 PM EDT ----- Please let her know that it is not surprising that CA 125 has not improved with short time on tamoxifen so far. Would continue the hormone therapy for now and we will obviously follow the marker.  I have sent all of the information to Dr Polly Cobia for visit there early June. Also let her know that I have requested [facility] testing on the pathology from [facility] as we discussed.   thanks

## 2015-11-03 NOTE — Telephone Encounter (Signed)
Discussed the results of the CA-125 results with Diane Lozano as noted below by Dr. Marko Plume.  Diane Lozano verbalized understanding.

## 2015-11-04 ENCOUNTER — Ambulatory Visit
Admission: RE | Admit: 2015-11-04 | Discharge: 2015-11-04 | Disposition: A | Discharge status: Home / Self Care | Payer: 59 | Source: Ambulatory Visit | Attending: Endocrinology | Admitting: Endocrinology

## 2015-11-04 ENCOUNTER — Ambulatory Visit
Admission: RE | Admit: 2015-11-04 | Discharge: 2015-11-04 | Disposition: A | Discharge status: Home / Self Care | Payer: 59 | Source: Ambulatory Visit | Attending: Radiation Oncology | Admitting: Radiation Oncology

## 2015-11-04 ENCOUNTER — Ambulatory Visit: Payer: 59 | Admitting: Oncology

## 2015-11-04 ENCOUNTER — Encounter: Payer: Self-pay | Admitting: Radiation Oncology

## 2015-11-04 ENCOUNTER — Other Ambulatory Visit: Payer: 59

## 2015-11-04 VITALS — BP 127/69 | HR 73 | Temp 98.4°F | Ht 63.0 in | Wt 148.6 lb

## 2015-11-04 DIAGNOSIS — C541 Malignant neoplasm of endometrium: Secondary | ICD-10-CM | POA: Diagnosis not present

## 2015-11-04 DIAGNOSIS — E041 Nontoxic single thyroid nodule: Secondary | ICD-10-CM

## 2015-11-04 DIAGNOSIS — C771 Secondary and unspecified malignant neoplasm of intrathoracic lymph nodes: Secondary | ICD-10-CM

## 2015-11-04 DIAGNOSIS — C772 Secondary and unspecified malignant neoplasm of intra-abdominal lymph nodes: Secondary | ICD-10-CM | POA: Diagnosis not present

## 2015-11-04 NOTE — Progress Notes (Signed)
Radiation Oncology         [phone]) [phone] ________________________________  Name: Diane Lozano MRN: [id number]  Date: 11/04/2015  DOB: [birth year]  Follow-Up Visit Note  CC: [name], MD  [name], MD    ICD-9-CM ICD-10-CM   1. Metastatic cancer to intrathoracic lymph nodes (HCC) 196.1 C77.1     Diagnosis: Recurrent Endometrial cancer (high-grade serous adenocarcinoma) with metastasis to the retroperitoneal lymph nodes, Now with isolated intrathoracic nodal recurrence     Interval Since Last Radiation: 4 months  October 31 through May 13, 2015: PET positive retroperitoneal nodes 50.4 gray in 28 fractions with simultaneous integrated boost to 56 gray  May 27, June 5,  June 11, June 19, November 27, 2012: Proximal vagina 30 Gy in 5 fractions  Narrative:  The patient returns today for routine follow-up. She denies having pain. She is alternating tamoxifen and megace every 3 weeks. She denies having nausea, bladder/bowel issues, vaginal/rectal bleeding or discharge. She reports her energy level is good. She took a trip to [place] and returned on 10/20/15. Her CA 125 test in May was 53.2, which is elevated from 36.3 a month ago. She was on no therapy at that time.  The patient is curious to see if the right paratracheal adenopathy could be biopsied. The patient's case was presented to thoracic conference and gynecologic oncology conference to get input whether to treat this new area with radiation in addition to her hormone therapy since this is an isolated area of occurrence (oligometastasis). Only homonal therapy was recommended at this time.  ALLERGIES:  has No Known Allergies.  Meds: Current Outpatient Prescriptions  Medication Sig Dispense Refill  . ALPRAZolam (XANAX) 0.25 MG tablet Take 1 tablet (0.25 mg total) by mouth every 8 (eight) hours as needed (for severe anxiety). 30 tablet 0  . Calcium Carb-Cholecalciferol 500-600 MG-UNIT TABS Take 1 tablet by mouth daily.     Marland Kitchen ketoconazole (NIZORAL) 2 % cream   0  . megestrol (MEGACE) 40 MG tablet Take 2 tablets (80 mg total) by mouth 2 (two) times daily. X 3 weeks alternating with tamoxifen 84 tablet 3  . simvastatin (ZOCOR) 40 MG tablet Take 40 mg by mouth every evening.    . tamoxifen (NOLVADEX) 20 MG tablet Take 1 tablet (20 mg total) by mouth 2 (two) times daily. X 3 weeks alternating with Megace. 42 tablet 3  . venlafaxine XR (EFFEXOR-XR) 75 MG 24 hr capsule Take 75 mg by mouth daily.    Marland Kitchen LORazepam (ATIVAN) 1 MG tablet Place 1/2 -1 tablet under the tongue or swallow every 6 hrs as needed for nausea.  Will make drowsy. (Patient not taking: Reported on 06/24/2015) 30 tablet 1  . ondansetron (ZOFRAN) 8 MG tablet Take 1 tablet (8 mg total) by mouth every 8 (eight) hours as needed for nausea or vomiting. (Patient not taking: Reported on 06/24/2015) 30 tablet 2  . prochlorperazine (COMPAZINE) 10 MG tablet Take 1 tablet (10 mg total) by mouth every 6 (six) hours as needed for nausea or vomiting. (Patient not taking: Reported on 06/24/2015) 30 tablet 1   No current facility-administered medications for this encounter.    Physical Findings: The patient is in no acute distress. Patient is alert and oriented.  height is 5\' 3"  (1.6 m) and weight is 148 lb 9.6 oz (67.405 kg). Her oral temperature is 98.4 F (36.9 C). Her blood pressure is 127/69 and her pulse is 73. Her oxygen saturation is 99%.  Lungs are clear to auscultation bilaterally. Heart has regular rate and rhythm. No palpable cervical, supraclavicular, or axillary adenopathy. Abdomen soft, non-tender, with normal bowel sounds.  Lab Findings: Lab Results  Component Value Date   WBC 3.2* 10/28/2015   HGB 11.9 10/28/2015   HCT 35.4 10/28/2015   MCV 96.2 10/28/2015   PLT 156 10/28/2015    Radiographic Findings: US Soft Tissue Head/neck  11/04/2015  CLINICAL DATA:  Followup thyroid nodule EXAM: THYROID ULTRASOUND TECHNIQUE: Ultrasound examination of the  thyroid gland and adjacent soft tissues was performed. COMPARISON:  09/20/2015 FINDINGS: Right thyroid lobe Measurements: 3.9 x 2.2 x 2.1 cm. Almost the entire lobe on the right is involved in a large heterogeneous nodule. This has been previously biopsied and shown to be benign. Left thyroid lobe Measurements: 4.0 x 10.9 x 1.1 cm.  No nodules visualized. Isthmus Thickness: 0.4 cm.  No nodules visualized. Lymphadenopathy None visualized. IMPRESSION: Stable benign right thyroid nodule. Electronically Signed   By: Inez Catalina M.D.   On: 11/04/2015 14:48    Impression:    Recent PET scan showed the development of new right paratracheal hypermetabolic adenopathy that is consistent with nodal metastasis and a persistent hypermetabolic right thyroid nodule. However, the abdominal retroperitoneal hypermetabolic node has resolved and biopsy of the hypermetabolic thyroid nodule was benign.  Plan: The patient will continue on hormonal therapy and at a later date, she will undergo scan to access her response to the therapy. PRN follow up with radiation oncology and she will continue close follow up under medical oncology. ____________________________________  Blair Promise, PhD, MD  This document serves as a record of services personally performed by Gery Pray, MD. It was created on his behalf by Darcus Austin, a trained medical scribe. The creation of this record is based on the scribe's personal observations and the provider's statements to them. This document has been checked and approved by the attending provider.

## 2015-11-04 NOTE — Progress Notes (Signed)
Diane Lozano here for follow up.  She denies having pain.  She is alternating tamoxifen and megace every 3 weeks.  She denies having nausea, bladder/bowel issues, vaginal/rectal bleeding or discharge.  She reports her energy level is good.   She took a trip to [place] and returned on 10/20/15.   BP 127/69 mmHg  Pulse 73  Temp(Src) 98.4 F (36.9 C) (Oral)  Ht 5\' 3"  (1.6 m)  Wt 148 lb 9.6 oz (67.405 kg)  BMI 26.33 kg/m2  SpO2 99%   Wt Readings from Last 3 Encounters:  11/04/15 148 lb 9.6 oz (67.405 kg)  10/28/15 150 lb 14.4 oz (68.448 kg)  10/04/15 143 lb 11.2 oz (65.182 kg)

## 2015-11-05 NOTE — Addendum Note (Signed)
Encounter addended by: Jacqulyn Liner, RN on: 11/05/2015  1:11 PM<BR>     Documentation filed: Charges VN

## 2015-11-17 ENCOUNTER — Encounter (HOSPITAL_COMMUNITY): Payer: Self-pay

## 2015-11-25 DIAGNOSIS — Z170 Estrogen receptor positive status [ER+]: Secondary | ICD-10-CM | POA: Diagnosis not present

## 2015-11-25 DIAGNOSIS — Z8542 Personal history of malignant neoplasm of other parts of uterus: Secondary | ICD-10-CM | POA: Diagnosis not present

## 2015-11-25 DIAGNOSIS — Z90722 Acquired absence of ovaries, bilateral: Secondary | ICD-10-CM | POA: Diagnosis not present

## 2015-11-25 DIAGNOSIS — C775 Secondary and unspecified malignant neoplasm of intrapelvic lymph nodes: Secondary | ICD-10-CM | POA: Diagnosis not present

## 2015-11-25 DIAGNOSIS — Z9079 Acquired absence of other genital organ(s): Secondary | ICD-10-CM | POA: Diagnosis not present

## 2015-11-25 DIAGNOSIS — Z87891 Personal history of nicotine dependence: Secondary | ICD-10-CM | POA: Diagnosis not present

## 2015-11-25 DIAGNOSIS — I10 Essential (primary) hypertension: Secondary | ICD-10-CM | POA: Diagnosis not present

## 2015-11-25 DIAGNOSIS — Z90710 Acquired absence of both cervix and uterus: Secondary | ICD-10-CM | POA: Diagnosis not present

## 2015-11-25 DIAGNOSIS — Z5181 Encounter for therapeutic drug level monitoring: Secondary | ICD-10-CM | POA: Diagnosis not present

## 2015-11-25 DIAGNOSIS — E785 Hyperlipidemia, unspecified: Secondary | ICD-10-CM | POA: Diagnosis not present

## 2015-11-25 DIAGNOSIS — C541 Malignant neoplasm of endometrium: Secondary | ICD-10-CM | POA: Diagnosis not present

## 2015-11-25 DIAGNOSIS — Z79810 Long term (current) use of selective estrogen receptor modulators (SERMs): Secondary | ICD-10-CM | POA: Diagnosis not present

## 2015-12-02 MED FILL — MEGESTROL 40 MG TABLET: 40 | 21 days supply | Qty: 84 | Fill #1

## 2015-12-02 MED FILL — SIMVASTATIN 40 MG TABLET: 40 | 90 days supply | Qty: 90 | Fill #2

## 2015-12-02 MED FILL — VENLAFAXINE HCL ER 75 MG CA: 75 | 90 days supply | Qty: 90 | Fill #3

## 2015-12-02 MED FILL — TAMOXIFEN 20 MG TABLET: 20 | 21 days supply | Qty: 42 | Fill #2

## 2015-12-06 ENCOUNTER — Other Ambulatory Visit: Payer: Self-pay | Admitting: Endocrinology

## 2015-12-06 DIAGNOSIS — E041 Nontoxic single thyroid nodule: Secondary | ICD-10-CM

## 2015-12-15 ENCOUNTER — Other Ambulatory Visit: Payer: Self-pay | Admitting: Oncology

## 2015-12-16 ENCOUNTER — Ambulatory Visit (HOSPITAL_BASED_OUTPATIENT_CLINIC_OR_DEPARTMENT_OTHER): Payer: 59 | Admitting: Oncology

## 2015-12-16 ENCOUNTER — Other Ambulatory Visit (HOSPITAL_BASED_OUTPATIENT_CLINIC_OR_DEPARTMENT_OTHER): Payer: 59

## 2015-12-16 ENCOUNTER — Telehealth: Payer: Self-pay

## 2015-12-16 ENCOUNTER — Encounter: Payer: Self-pay | Admitting: Oncology

## 2015-12-16 ENCOUNTER — Telehealth: Payer: Self-pay | Admitting: Oncology

## 2015-12-16 VITALS — BP 136/62 | HR 66 | Temp 98.4°F | Resp 18 | Ht 63.0 in | Wt 158.8 lb

## 2015-12-16 DIAGNOSIS — C541 Malignant neoplasm of endometrium: Secondary | ICD-10-CM | POA: Diagnosis not present

## 2015-12-16 DIAGNOSIS — C7989 Secondary malignant neoplasm of other specified sites: Secondary | ICD-10-CM

## 2015-12-16 DIAGNOSIS — C772 Secondary and unspecified malignant neoplasm of intra-abdominal lymph nodes: Secondary | ICD-10-CM | POA: Diagnosis not present

## 2015-12-16 DIAGNOSIS — R590 Localized enlarged lymph nodes: Secondary | ICD-10-CM

## 2015-12-16 DIAGNOSIS — D72819 Decreased white blood cell count, unspecified: Secondary | ICD-10-CM

## 2015-12-16 DIAGNOSIS — Z79899 Other long term (current) drug therapy: Secondary | ICD-10-CM

## 2015-12-16 LAB — COMPREHENSIVE METABOLIC PANEL
ALT: 12 U/L (ref 0–55)
AST: 18 U/L (ref 5–34)
Albumin: 3.6 g/dL (ref 3.5–5.0)
Alkaline Phosphatase: 41 U/L (ref 40–150)
Anion Gap: 9 mEq/L (ref 3–11)
BUN: 21.1 mg/dL (ref 7.0–26.0)
CO2: 21 mEq/L — ABNORMAL LOW (ref 22–29)
Calcium: 9 mg/dL (ref 8.4–10.4)
Chloride: 110 mEq/L — ABNORMAL HIGH (ref 98–109)
Creatinine: 0.9 mg/dL (ref 0.6–1.1)
EGFR: 73 mL/min/{1.73_m2} — ABNORMAL LOW (ref >90)
Glucose: 89 mg/dl (ref 70–140)
Potassium: 3.8 mEq/L (ref 3.5–5.1)
Sodium: 140 mEq/L (ref 136–145)
Total Bilirubin: 0.4 mg/dL (ref 0.20–1.20)
Total Protein: 6.8 g/dL (ref 6.4–8.3)

## 2015-12-16 LAB — CBC WITH DIFFERENTIAL/PLATELET
BASO%: 1.3 % (ref 0.0–2.0)
Basophils Absolute: 0 10*3/uL (ref 0.0–0.1)
EOS%: 7.5 % — ABNORMAL HIGH (ref 0.0–7.0)
Eosinophils Absolute: 0.2 10*3/uL (ref 0.0–0.5)
HCT: 34.8 % (ref 34.8–46.6)
HGB: 11.8 g/dL (ref 11.6–15.9)
LYMPH%: 32.2 % (ref 14.0–49.7)
MCH: 32.4 pg (ref 25.1–34.0)
MCHC: 33.9 g/dL (ref 31.5–36.0)
MCV: 95.6 fL (ref 79.5–101.0)
MONO#: 0.2 10*3/uL (ref 0.1–0.9)
MONO%: 6.6 % (ref 0.0–14.0)
NEUT#: 1.7 10*3/uL (ref 1.5–6.5)
NEUT%: 52.4 % (ref 38.4–76.8)
Platelets: 178 10*3/uL (ref 145–400)
RBC: 3.64 10*6/uL — ABNORMAL LOW (ref 3.70–5.45)
RDW: 12.7 % (ref 11.2–14.5)
WBC: 3.2 10*3/uL — ABNORMAL LOW (ref 3.9–10.3)
lymph#: 1 10*3/uL (ref 0.9–3.3)

## 2015-12-16 MED ORDER — MEGESTROL ACETATE 40 MG PO TABS: 80.0000 mg | ORAL_TABLET | Freq: Two times a day (BID) | ORAL | Status: DC

## 2015-12-16 MED ORDER — TAMOXIFEN CITRATE 20 MG PO TABS: 20.0000 mg | ORAL_TABLET | Freq: Two times a day (BID) | ORAL | Status: DC

## 2015-12-16 NOTE — Telephone Encounter (Signed)
-----   Message from [name], MD sent at 12/16/2015 10:10 AM EDT ----- Refills x 4 megace and tamoxifen please

## 2015-12-16 NOTE — Telephone Encounter (Signed)
Appt made and avs printed. Radiology to schedule pet scan

## 2015-12-16 NOTE — Progress Notes (Signed)
OFFICE PROGRESS NOTE   December 18, 2015   Physicians: Genia Del, Gery Pray , Sharren Bridge  Dr [facility] office note [facility] 11-25-15) and letter to this MD reviewed for visit now, both scanned into this EMR  INTERVAL HISTORY:   Patient is seen, alone for visit, in continuing attention to recurrent endometrial carcinoma, now on treatment with alternating megace and tamoxifen every 3 weeks, begun May 2017 (interrupted first cycle of tamoxifen due to trip [place]). She has one week more of megace this cycle. She had follow up with Dr Polly Cobia on 11-25-15, recommendation for following with PET in ~ 3 months, as she has found PET most helpful with this regimen (and variability in CA 125 marker). Dr Polly Cobia will see patient again in 6 months. Patient has consultation with Dr Venida Jarvis Secord gyn onc at [facility] next week, particularly as she may be interested in trials in future. This referral was made by Dr Polly Cobia.   Patient was reassured by visit with Dr Polly Cobia and is in agreement with plans to continue the alternating hormonal treatment now. She has some increased hot flashes as well as increased appetite with megace, but no intolerable symptoms and no symptoms concerning for blood clots. She is otherwise asymptomatic from the small volume recurrent disease. Appetite and energy are at baseline, no abdominal or pelvic pain, no SOB, no fever or symptoms of infection. Previous hives on face and neck resolved (preceded start of tamoxifen) Remainder of 10 point Review of Systems negative.     No central catheter.  No genetics testing CA 125 132 on 11-27-14. CA 125 baseline for tamoxifen/ megace 37.8 on 09-09-15 [facility] testing done 11-2015 on surgical path from 2014, scanned into this EMR:    MS stable   TMB low  4 muts/mb   ATM [id number]   ERBB3 T389K   E2H2 rearrangement exon 9   PPP2R1A P179R   TP53 I195N   ONCOLOGIC HISTORY Patient presented to PCP with  intermittent vaginal bleeding since ~ Oct 2013, endometrial biopsy 06-20-12 with complex endometrial hyperplasia with atypia. Pelvic and transvaginal US in [place] system on 06-26-12 showed endometrial thickening of 2.7 cm, right ovary not clearly seen and left ovary with normal postmenopausal appearance. She was seen by Dr Christophe Louis and referred to Dr Polly Cobia. She had robotic hysterectomy with BSO and bilateral pelvic and periaortic node dissection by Dr Polly Cobia at [facility] on 07-30-2012. Intraoperatively she had large endometrial polyp with high grade malignancy, no obvious extrauterine disease. Pathology ([facility] [place] [id number] from 07-30-2012) had high grade, poorly differentiated endometrial adenocarcinoma with solid and serous papillary components, 2 periaortic nodes negative and 9 right pelvic/11 left pelvic nodes negative. The tumor was 4.8 cm confined to polyp with no definite myometrial involvement, margins clear and no angiolymphatic invasion identified.  Cycle 1 taxol carboplatin was given 08-30-12, 6 cycles of q 3 week regimen given thru 12-13-2012, with gCSF support. Radiation was intracavitary HDR to proximal vagina 30 Gy in 5 fractions from May 27 thru November 27, 2012, by Dr Sondra Come. Patient was followed closely by Dr Polly Cobia after completing adjuvant taxol carboplatin chemotherapy in 12-2012. Patient felt very well, with intentional 70 lb weight loss over a year from Jan thru Dec 2015 on [employer]. She saw Dr Polly Cobia with unremarkable exam in March 2016, however CA 125 was slightly elevated at 36.3, up from previous 29.2. She had CT CAP with contrast 10-16-14 with new left  periaortic and left common iliac lymphadenopathy. PET 8-75-79 had hypermetabolic uptake in retroperitoneal nodes from lower poles of kidneys to aortic bifurcation,largest 2.0 x 2.1 cm, as well as uptake in either right supraclavicular node vs right lobe of thyroid (report below).  FNA of thyroid 10-28-14 with benign tissue, [place] at time of biopsy reportedly showing no right supraclavicular adenopathy. She had CT needle biopsy of left para-aortic node 11-16-14 with path showing serous carcinoma. She began chemotherapy with dose dense carbo taxol on 11-27-14, with carbo at AUC = 5. Over next 3 weeks WBC and platelets were too low for treatments, despite total 7 doses of granix. She had taxol only cycle 2, then [name] at AUC=2 + taxol cycle 3. Counts maintained with increase in gCSF to 480 mcg 3-4 doses after each treatment, including increase in [name] for third dose to AUC =3 on 02-11-15. PET 03-01-15 had uptake in one left peri-aortic node and the area of previous benign biopsy right lobe thyroid. She received IMRT, summary as follows:  Diagnosis: Recurrent Endometrial cancer (high-grade serous adenocarcinoma) with metastasis to the retroperitoneal lymph nodes  Indication for treatment: Isolated recurrence in this region, definitive treatment  Radiation treatment dates: October 31 through December 8 Site/dose: PET positive retroperitoneal nodes 50.4 gray in 28 fractions with simultaneous integrated boost to 56 gray CT AP 06-17-15 showed no apparent residual or progressive disease. PET 08-31-15, compared with 03-01-15 and CT 06-17-15, showed no findings of concern for persistent or progressive disease in abdomen or pelvis. There is same hypermetabolic right thyroid nodule (2.8 cm with SUV 10.4 compared with 2.7 cm with SUV 10 on prior) and new right paratracheal adenopathy with node 2.3 cm and SUV 17.7.  Surgical path from 07-30-12 tested for ER PR on 09-06-15, with ER + intermediate in 25-35% and PR + strong in 25-35%.  Biopsy of thyroid nodule benign 09-20-15.  She began tamoxifen/ megace 09-2015 (break during first 3 weeks of tamoxifen for trip overseas).   Objective:  Vital signs in last 24 hours: Weight up 10 lbs from early June, to 158 (BMI 28). 136/62, 66 regular, 18 not  labored, 98.4, 100%.  Alert, oriented and appropriate. Ambulatory without difficulty.    HEENT:PERRL, sclerae not icteric. Oral mucosa moist without lesions, posterior pharynx clear.  Neck supple. No JVD.  Lymphatics:no cervical,supraclavicular, axillary or inguinal adenopathy Resp: clear to auscultation bilaterally and normal percussion bilaterally Cardio: regular rate and rhythm. No gallop. GI: soft, nontender, not distended, no mass or organomegaly. Normally active bowel sounds. Surgical incision not remarkable. Musculoskeletal/ Extremities: LE without pitting edema, cords, tenderness. Mild kyphosis baseline. Neuro: no peripheral neuropathy. Otherwise nonfocal. PSYCH appropriate mood and affect, not obviously anxious today Skin without rash, ecchymosis, petechiae   Lab Results:                                                      12-16-15            10-28-15         10-04-15 WBC 3.9 - 10.3 10e3/uL  3.2 (L) 3.2 2.5    NEUT# 1.5 - 6.5 10e3/uL 1.7 2.3 1.6    HGB 11.6 - 15.9 g/dL 11.8 11.9 12.3    HCT 34.8 - 46.6 % 34.8 35.4 36.9    Platelets 145 - 400 10e3/uL 178 156 131 (  L)    MCV 79.5 - 101.0 fL 95.6 96.2 94.8    MCH 25.1 - 34.0 pg 32.4 32.3 31.7    MCHC 31.5 - 36.0 g/dL 33.9 33.6 33.4    RBC 3.70 - 5.45 10e6/uL 3.64 (L) 3.68 (L) 3.89    RDW 11.2 - 14.5 % 12.7 12.7 12.1    lymph# 0.9 - 3.3 10e3/uL 1.0 0.5 (L) 0.6 (L)    MONO# 0.1 - 0.9 10e3/uL 0.2 0.2 0.2    Eosinophils Absolute 0.0 - 0.5 10e3/uL 0.2 0.1 0.1    Basophils Absolute 0.0 - 0.1 10e3/uL 0.0 0.0 0.0    NEUT% 38.4 - 76.8 % 52.4 72.2 62.7    LYMPH% 14.0 - 49.7 % 32.2 16.7 23.5    MONO% 0.0 - 14.0 % 6.6 5.9 8.8    EOS% 0.0 - 7.0 % 7.5 (H) 4.3 3.7    BASO% 0.0 - 2.0 % 1.3 0.9 1.3   Resulting Agency  [facility]                                                                                            12-16-15        10-28-15         10-04-15 Sodium 136 - 145 mEq/L  140 141 141    Potassium 3.5 - 5.1  mEq/L 3.8 3.9 3.9    Chloride 98 - 109 mEq/L 110 (H) 107 108    CO2 22 - 29 mEq/L 21 (L) 25 26    Glucose 70 - 140 mg/dl 89 105CM 96CM   Comments: Glucose reference range is for nonfasting patients. Fasting glucose reference range is 70- 100.    BUN 7.0 - 26.0 mg/dL 21.1 21.1 14.2    Creatinine 0.6 - 1.1 mg/dL 0.9 0.8 0.8    Total Bilirubin 0.20 - 1.20 mg/dL 0.40 0.38 0.50    Alkaline Phosphatase 40 - 150 U/L 41 67 50    AST 5 - 34 U/L 18 16 20     ALT 0 - 55 U/L 12 12 13     Total Protein 6.4 - 8.3 g/dL 6.8 6.5 7.0    Albumin 3.5 - 5.0 g/dL 3.6 3.6 4.0    Calcium 8.4 - 10.4 mg/dL 9.0 8.9 9.7    Anion Gap 3 - 11 mEq/L 9 8 7     EGFR >90 ml/min/1.73 m2           CBC and chemistries reviewed at visit, counts in usual range.  CA 125 available after visit today 38.1, this having been 53 on 10-28-15.   Studies/Results:  No results found.  Medications: I have reviewed the patient's current medications. Continue alternating tamoxifen and megace.  DISCUSSION  All of interval history reviewed. Patient plans to stay very aware of increased appetite from Megace, previous intentional weight loss of 70 lbs in 2015, but is in full agreement with continuing this treatment approach for now. We will let her know results of CA 125 from today, tho she understands Dr Shelba Flake recommendation not to rely on marker too heavily. She is pleased to be able to get established  at [facility] in case trials available for future treatment.  I will see her back in ~ 3 months with labs. She knows to call prior if needed.     Assessment/Plan:  1.recurrent high grade serous endometrial carcinoma involving para-aortic and common iliac nodes: ~ 22 months from completion of adjuvant chemotherapy for [place] poorly differentiated endometrial adenocarcinoma, ER PR +. Even dose reduced adjuvant carboplatin and weekly taxol with granix was difficult due to low counts; PET after initial chemo only uptake in a left  periaortic node (+thyroid where previously biopsied). Completed IMRT to retroperitoneal nodes 05-13-15. CT AP 06-17-15 no apparent residual or progressive disease. With some increase in CA125, PET obtained 08-31-15 with uptake right thyroid and paratracheal node, none in AP. Began alternating tamoxifen and megace 09-2015, completing first 3 weeks of tamoxifen 10-28-15. [facility] One testing done 11-2015. Consultation next week with Dr Santiago Glad at [facility]; to see Dr Polly Cobia ~ Dec.   2.Right thyroid nodule: hypermetabolic on PET, biopsy in [place] system 10-2014 no malignancy. Repeat US biopsy by IR 09-20-15 also benign. TFTs normal. Dr Forde Dandy to repeat US9-7-17 3.right paratracheal node(s) new from last scans and hypermetabolic on PET 5-32-99. Plan to follow on the tamoxifen/ megace for now 4.intentional weight loss of 70 lbs in 2015. Hypoglycemia intermittently by labs during treatment. Appetite increased with megace now. 5.chronic constipation, which was a concern during chemotherapy. Up to date on colonoscopy.  6..BP good without antihypertensives now, follow. Likely improved with weight loss  7..elevated lipids 8.remote minimal tobacco, not enough to qualify for the lung cancer screening program 9.up to date mammograms 10. flu vaccine 02-23-15 11.possibly low bone density, tho I do not find DEXA in this EMR 12.situational anxiety. Does not seem related to thyroid based on normal TFTs.    All questions answered. Time spent 25 min including >50% counseling and coordination of care. Route PCP, cc Drs Polly Cobia and Bynum Bellows, MD   12/18/2015, 2:40 PM

## 2015-12-16 NOTE — Telephone Encounter (Signed)
Contacted Dr. Carles Collet HIM and request recent office notes

## 2015-12-17 LAB — CA 125: Cancer Antigen (CA) 125: 38.1 U/mL (ref 0.0–38.1)

## 2015-12-20 ENCOUNTER — Telehealth: Payer: Self-pay

## 2015-12-20 NOTE — Telephone Encounter (Signed)
-----   Message from [name], MD sent at 12/18/2015  2:00 PM EDT ----- Labs seen and need follow up: please let her know marker down to 38

## 2015-12-20 NOTE — Telephone Encounter (Signed)
Told Diane Lozano the results of the CA-125 as noted below by Dr. Marko Plume.

## 2015-12-21 DIAGNOSIS — Z8639 Personal history of other endocrine, nutritional and metabolic disease: Secondary | ICD-10-CM | POA: Insufficient documentation

## 2015-12-21 DIAGNOSIS — E785 Hyperlipidemia, unspecified: Secondary | ICD-10-CM | POA: Insufficient documentation

## 2015-12-21 DIAGNOSIS — C541 Malignant neoplasm of endometrium: Secondary | ICD-10-CM | POA: Diagnosis not present

## 2015-12-23 ENCOUNTER — Encounter: Payer: Self-pay | Admitting: Oncology

## 2015-12-23 NOTE — Progress Notes (Signed)
Medical Oncology  Cover letter received from Dr Venida Jarvis Secord gyn onc [facility], who saw patient in consultation on 12-21-15. She agrees with continued hormonal therapy and repeat imaging in October. Can be seen back for clinical trials if progression. Note to be scanned into EMR.  Godfrey Pick, MD

## 2015-12-30 ENCOUNTER — Other Ambulatory Visit: Payer: 59

## 2015-12-30 ENCOUNTER — Ambulatory Visit: Payer: 59 | Admitting: Oncology

## 2016-01-20 MED FILL — MEGESTROL 40 MG TABLET: 40 | 21 days supply | Qty: 84 | Fill #2

## 2016-01-20 MED FILL — TAMOXIFEN 20 MG TABLET: 20 | 21 days supply | Qty: 42 | Fill #3

## 2016-02-03 DIAGNOSIS — Z8542 Personal history of malignant neoplasm of other parts of uterus: Secondary | ICD-10-CM | POA: Diagnosis not present

## 2016-02-03 DIAGNOSIS — Z79899 Other long term (current) drug therapy: Secondary | ICD-10-CM | POA: Diagnosis not present

## 2016-02-03 DIAGNOSIS — E041 Nontoxic single thyroid nodule: Secondary | ICD-10-CM | POA: Diagnosis not present

## 2016-02-03 DIAGNOSIS — C541 Malignant neoplasm of endometrium: Secondary | ICD-10-CM | POA: Diagnosis not present

## 2016-02-03 DIAGNOSIS — F411 Generalized anxiety disorder: Secondary | ICD-10-CM | POA: Diagnosis not present

## 2016-02-03 DIAGNOSIS — Z1159 Encounter for screening for other viral diseases: Secondary | ICD-10-CM | POA: Diagnosis not present

## 2016-02-03 DIAGNOSIS — Z0000 Encounter for general adult medical examination without abnormal findings: Secondary | ICD-10-CM | POA: Diagnosis not present

## 2016-02-03 DIAGNOSIS — E7800 Pure hypercholesterolemia, unspecified: Secondary | ICD-10-CM | POA: Diagnosis not present

## 2016-02-03 DIAGNOSIS — M859 Disorder of bone density and structure, unspecified: Secondary | ICD-10-CM | POA: Diagnosis not present

## 2016-02-08 ENCOUNTER — Other Ambulatory Visit: Payer: Self-pay | Admitting: Family Medicine

## 2016-02-08 DIAGNOSIS — M859 Disorder of bone density and structure, unspecified: Secondary | ICD-10-CM

## 2016-02-09 ENCOUNTER — Other Ambulatory Visit: Payer: Self-pay | Admitting: Family Medicine

## 2016-02-09 DIAGNOSIS — M8580 Other specified disorders of bone density and structure, unspecified site: Secondary | ICD-10-CM

## 2016-02-10 ENCOUNTER — Ambulatory Visit
Admission: RE | Admit: 2016-02-10 | Discharge: 2016-02-10 | Disposition: A | Discharge status: Home / Self Care | Payer: 59 | Source: Ambulatory Visit | Attending: Endocrinology | Admitting: Endocrinology

## 2016-02-10 DIAGNOSIS — E041 Nontoxic single thyroid nodule: Secondary | ICD-10-CM

## 2016-02-14 DIAGNOSIS — E041 Nontoxic single thyroid nodule: Secondary | ICD-10-CM | POA: Diagnosis not present

## 2016-02-14 DIAGNOSIS — C541 Malignant neoplasm of endometrium: Secondary | ICD-10-CM | POA: Diagnosis not present

## 2016-02-14 DIAGNOSIS — Z6827 Body mass index (BMI) 27.0-27.9, adult: Secondary | ICD-10-CM | POA: Diagnosis not present

## 2016-02-14 DIAGNOSIS — L218 Other seborrheic dermatitis: Secondary | ICD-10-CM | POA: Diagnosis not present

## 2016-02-14 MED FILL — DESONIDE 0.05% OINTMENT: 0.05 | 4 days supply | Qty: 15 | Fill #0

## 2016-03-02 MED FILL — VENLAFAXINE HCL ER 75 MG CA: 75 | 30 days supply | Qty: 90 | Fill #0

## 2016-03-02 MED FILL — SIMVASTATIN 40 MG TABLET: 40 | 90 days supply | Qty: 90 | Fill #0

## 2016-03-02 MED FILL — MEGESTROL 40 MG TABLET: 40 | 21 days supply | Qty: 84 | Fill #3

## 2016-03-03 ENCOUNTER — Other Ambulatory Visit: Payer: Self-pay

## 2016-03-03 DIAGNOSIS — C541 Malignant neoplasm of endometrium: Secondary | ICD-10-CM

## 2016-03-03 DIAGNOSIS — C772 Secondary and unspecified malignant neoplasm of intra-abdominal lymph nodes: Secondary | ICD-10-CM

## 2016-03-03 MED ORDER — TAMOXIFEN CITRATE 20 MG PO TABS: 20.0000 mg | ORAL_TABLET | Freq: Two times a day (BID) | ORAL | 3 refills | Status: DC

## 2016-03-05 ENCOUNTER — Other Ambulatory Visit: Payer: Self-pay | Admitting: Oncology

## 2016-03-05 DIAGNOSIS — C541 Malignant neoplasm of endometrium: Secondary | ICD-10-CM

## 2016-03-06 ENCOUNTER — Other Ambulatory Visit: Payer: Self-pay | Admitting: Oncology

## 2016-03-06 DIAGNOSIS — C771 Secondary and unspecified malignant neoplasm of intrathoracic lymph nodes: Secondary | ICD-10-CM

## 2016-03-06 DIAGNOSIS — C772 Secondary and unspecified malignant neoplasm of intra-abdominal lymph nodes: Secondary | ICD-10-CM

## 2016-03-07 ENCOUNTER — Telehealth: Payer: Self-pay

## 2016-03-07 NOTE — Telephone Encounter (Signed)
-----   Message from [name], MD sent at 03/06/2016  5:44 PM EDT ----- Re scheduling PET prior to seeing Dr Marko Plume in Oct.  Please let her know Fine to get PET prior to visit. I have sent scheduling message to move my visit + lab from 10-5 to late Oct and have ordered PET + message to managed care for preauth  thanks

## 2016-03-07 NOTE — Telephone Encounter (Signed)
lvm we are changing her appt to after PET, and getting PET prior auth and scheduled.

## 2016-03-09 ENCOUNTER — Other Ambulatory Visit: Payer: 59

## 2016-03-09 ENCOUNTER — Ambulatory Visit: Payer: 59 | Admitting: Oncology

## 2016-03-10 ENCOUNTER — Ambulatory Visit
Admission: RE | Admit: 2016-03-10 | Discharge: 2016-03-10 | Disposition: A | Discharge status: Home / Self Care | Payer: 59 | Source: Ambulatory Visit | Attending: Family Medicine | Admitting: Family Medicine

## 2016-03-10 DIAGNOSIS — M8589 Other specified disorders of bone density and structure, multiple sites: Secondary | ICD-10-CM | POA: Diagnosis not present

## 2016-03-10 DIAGNOSIS — M8580 Other specified disorders of bone density and structure, unspecified site: Secondary | ICD-10-CM

## 2016-03-10 DIAGNOSIS — Z780 Asymptomatic menopausal state: Secondary | ICD-10-CM | POA: Diagnosis not present

## 2016-03-10 MED FILL — TAMOXIFEN 20 MG TABLET: 20 | 21 days supply | Qty: 42 | Fill #0

## 2016-03-13 ENCOUNTER — Telehealth: Payer: Self-pay

## 2016-03-13 NOTE — Telephone Encounter (Signed)
Told Diane Lozano that the PET scan is scheduled for 03-22-16 at 0900.  She is to arrive at 0830 at [facility] radiology.  NPO after MN 03-21-16. Schedule for follow up wit Dr/ Marko Plume on 03-23-17 as there was an opening at 1300.  Kept 04-03-16 appointment  As scheduled. This appointment can be cancelled at 03-23-16 appointment if not needed. Diane Lozano verbalized understanding.

## 2016-03-22 ENCOUNTER — Ambulatory Visit (HOSPITAL_COMMUNITY)
Admission: RE | Admit: 2016-03-22 | Discharge: 2016-03-22 | Disposition: A | Discharge status: Home / Self Care | Payer: 59 | Source: Ambulatory Visit | Attending: Oncology | Admitting: Oncology

## 2016-03-22 DIAGNOSIS — C771 Secondary and unspecified malignant neoplasm of intrathoracic lymph nodes: Secondary | ICD-10-CM

## 2016-03-22 DIAGNOSIS — C778 Secondary and unspecified malignant neoplasm of lymph nodes of multiple regions: Secondary | ICD-10-CM | POA: Diagnosis not present

## 2016-03-22 DIAGNOSIS — E041 Nontoxic single thyroid nodule: Secondary | ICD-10-CM | POA: Insufficient documentation

## 2016-03-22 DIAGNOSIS — C772 Secondary and unspecified malignant neoplasm of intra-abdominal lymph nodes: Secondary | ICD-10-CM

## 2016-03-22 DIAGNOSIS — C541 Malignant neoplasm of endometrium: Secondary | ICD-10-CM | POA: Diagnosis not present

## 2016-03-22 LAB — GLUCOSE, CAPILLARY: Glucose-Capillary: 87 mg/dL (ref 65–99)

## 2016-03-22 MED ORDER — FLUDEOXYGLUCOSE F - 18 (FDG) INJECTION: 7.8300 | Freq: Once | INTRAVENOUS | Status: DC | PRN

## 2016-03-22 MED ORDER — FLUDEOXYGLUCOSE F - 18 (FDG) INJECTION
7.8300 | Freq: Once | INTRAVENOUS | Status: AC | PRN
  Administered 2016-03-22: 7.83 via INTRAVENOUS

## 2016-03-23 ENCOUNTER — Ambulatory Visit (HOSPITAL_BASED_OUTPATIENT_CLINIC_OR_DEPARTMENT_OTHER): Payer: 59 | Admitting: Oncology

## 2016-03-23 ENCOUNTER — Encounter: Payer: Self-pay | Admitting: Oncology

## 2016-03-23 ENCOUNTER — Other Ambulatory Visit (HOSPITAL_BASED_OUTPATIENT_CLINIC_OR_DEPARTMENT_OTHER): Payer: 59

## 2016-03-23 VITALS — BP 155/70 | HR 72 | Temp 98.6°F | Resp 18 | Ht 63.0 in | Wt 159.3 lb

## 2016-03-23 DIAGNOSIS — C541 Malignant neoplasm of endometrium: Secondary | ICD-10-CM | POA: Diagnosis not present

## 2016-03-23 DIAGNOSIS — C778 Secondary and unspecified malignant neoplasm of lymph nodes of multiple regions: Secondary | ICD-10-CM | POA: Diagnosis not present

## 2016-03-23 DIAGNOSIS — R590 Localized enlarged lymph nodes: Secondary | ICD-10-CM | POA: Diagnosis not present

## 2016-03-23 DIAGNOSIS — E041 Nontoxic single thyroid nodule: Secondary | ICD-10-CM | POA: Diagnosis not present

## 2016-03-23 LAB — COMPREHENSIVE METABOLIC PANEL
ALT: 12 U/L (ref 0–55)
AST: 21 U/L (ref 5–34)
Albumin: 3.8 g/dL (ref 3.5–5.0)
Alkaline Phosphatase: 44 U/L (ref 40–150)
Anion Gap: 12 mEq/L — ABNORMAL HIGH (ref 3–11)
BUN: 15.1 mg/dL (ref 7.0–26.0)
CO2: 19 mEq/L — ABNORMAL LOW (ref 22–29)
Calcium: 9.4 mg/dL (ref 8.4–10.4)
Chloride: 109 mEq/L (ref 98–109)
Creatinine: 0.9 mg/dL (ref 0.6–1.1)
EGFR: 73 mL/min/{1.73_m2} — ABNORMAL LOW (ref >90)
Glucose: 93 mg/dl (ref 70–140)
Potassium: 3.7 mEq/L (ref 3.5–5.1)
Sodium: 140 mEq/L (ref 136–145)
Total Bilirubin: 0.35 mg/dL (ref 0.20–1.20)
Total Protein: 7.4 g/dL (ref 6.4–8.3)

## 2016-03-23 LAB — CBC WITH DIFFERENTIAL/PLATELET
BASO%: 1.4 % (ref 0.0–2.0)
Basophils Absolute: 0.1 10*3/uL (ref 0.0–0.1)
EOS%: 4 % (ref 0.0–7.0)
Eosinophils Absolute: 0.2 10*3/uL (ref 0.0–0.5)
HCT: 37.6 % (ref 34.8–46.6)
HGB: 13.1 g/dL (ref 11.6–15.9)
LYMPH%: 31.6 % (ref 14.0–49.7)
MCH: 33.2 pg (ref 25.1–34.0)
MCHC: 34.8 g/dL (ref 31.5–36.0)
MCV: 95.2 fL (ref 79.5–101.0)
MONO#: 0.3 10*3/uL (ref 0.1–0.9)
MONO%: 6.1 % (ref 0.0–14.0)
NEUT#: 2.4 10*3/uL (ref 1.5–6.5)
NEUT%: 56.9 % (ref 38.4–76.8)
Platelets: 184 10*3/uL (ref 145–400)
RBC: 3.95 10*6/uL (ref 3.70–5.45)
RDW: 12.2 % (ref 11.2–14.5)
WBC: 4.3 10*3/uL (ref 3.9–10.3)
lymph#: 1.4 10*3/uL (ref 0.9–3.3)

## 2016-03-23 NOTE — Progress Notes (Signed)
OFFICE PROGRESS NOTE   March 23, 2016   Physicians: Genia Del, Gery Pray , [name], [place], [place] (gyn onc [facility])  INTERVAL HISTORY:  Patient is seen, together with husband, in continuing attention to recurrent endometrial carcinoma for which she has been on alternating megace and tamoxifen since May 2017. She had restaging PET 03-22-16, which shows some interval progression of the same hypermetabolic right paratracheal lymph node and stable hypermetabolic right thyroid nodule.  She saw Dr Polly Cobia last in 11-2015 and will see her again in Dec. She had consultation with Dr Santiago Glad in 12-2015, who agreed with tamoxifen/ megace and is glad to assist with trials in future if needed.  She is known to Dr Sondra Come. Note repeat US thyroid 02-10-16 by Dr Forde Dandy, who is following that area since 2 negative biopsies.  Patient has felt entirely well, tolerating tamoxifen and megace without difficulty other than increased appetite with megace. Hot flashes are no longer bothersome. Energy is good, no chest, abdominal or pelvic discomfort, no pain, no LE swelling, no recent infectious illness.  She is on Weight Watchers, which she tries to follow closely, but has been doing no regular exercise.  Remainder of 10 point Review of Systems negative.  No central catheter.  No genetics testing CA 125 132 on 11-27-14. CA 125 baseline for tamoxifen/ megace 37.8 on 09-09-15 Foundation One testing done 11-2015 on surgical path from 2014, scanned into this EMR:    MS stable   TMB low  4 muts/mb   ATM [id number]   ERBB3 T389K   E2H2 rearrangement exon 9   PPP2R1A P179R   TP53 I195N   ONCOLOGIC HISTORY Patient presented to PCP with intermittent vaginal bleeding since ~ Oct 2013, endometrial biopsy 06-20-12 with complex endometrial hyperplasia with atypia. Pelvic and transvaginal US in [place] system on 06-26-12 showed endometrial thickening of 2.7 cm, right ovary not clearly seen and  left ovary with normal postmenopausal appearance. She was seen by Dr Christophe Louis and referred to Dr Polly Cobia. She had robotic hysterectomy with BSO and bilateral pelvic and periaortic node dissection by Dr Polly Cobia at [facility] on 07-30-2012. Intraoperatively she had large endometrial polyp with high grade malignancy, no obvious extrauterine disease. Pathology ([facility] [place] [id number] from 07-30-2012) had high grade, poorly differentiated endometrial adenocarcinoma with solid and serous papillary components, 2 periaortic nodes negative and 9 right pelvic/11 left pelvic nodes negative. The tumor was 4.8 cm confined to polyp with no definite myometrial involvement, margins clear and no angiolymphatic invasion identified.  Cycle 1 taxol carboplatin was given 08-30-12, 6 cycles of q 3 week regimen given thru 12-13-2012, with gCSF support. Radiation was intracavitary HDR to proximal vagina 30 Gy in 5 fractions from May 27 thru November 27, 2012, by Dr Sondra Come. Patient was followed closely by Dr Polly Cobia after completing adjuvant taxol carboplatin chemotherapy in 12-2012. Patient felt very well, with intentional 70 lb weight loss over a year from Jan thru Dec 2015 on Weight Watchers. She saw Dr Polly Cobia with unremarkable exam in March 2016, however CA 125 was slightly elevated at 36.3, up from previous 29.2. She had CT CAP with contrast 10-16-14 with new left periaortic and left common iliac lymphadenopathy. PET 4-70-96 had hypermetabolic uptake in retroperitoneal nodes from lower poles of kidneys to aortic bifurcation,largest 2.0 x 2.1 cm, as well as uptake in either right supraclavicular node vs right lobe of thyroid (report below). FNA of thyroid 10-28-14 with benign tissue, [place] at  time of biopsy reportedly showing no right supraclavicular adenopathy. She had CT needle biopsy of left para-aortic node 11-16-14 with path showing serous carcinoma. She began chemotherapy with dose dense carbo  taxol on 11-27-14, with carbo at AUC = 5. Over next 3 weeks WBC and platelets were too low for treatments, despite total 7 doses of granix. She had taxol only cycle 2, then [name] at AUC=2 + taxol cycle 3. Counts maintained with increase in gCSF to 480 mcg 3-4 doses after each treatment, including increase in [name] for third dose to AUC =3 on 02-11-15. PET 03-01-15 had uptake in one left peri-aortic node and the area of previous benign biopsy right lobe thyroid. She received IMRT, summary as follows:  Diagnosis: Recurrent Endometrial cancer (high-grade serous adenocarcinoma) with metastasis to the retroperitoneal lymph nodes  Indication for treatment: Isolated recurrence in this region, definitive treatment  Radiation treatment dates: October 31 through December 8 Site/dose: PET positive retroperitoneal nodes 50.4 gray in 28 fractions with simultaneous integrated boost to 56 gray CT AP 06-17-15 showed no apparent residual or progressive disease. PET 08-31-15, compared with 03-01-15 and CT 06-17-15, showed no findings of concern for persistent or progressive disease in abdomen or pelvis. There is same hypermetabolic right thyroid nodule (2.8 cm with SUV 10.4 compared with 2.7 cm with SUV 10 on prior) and new right paratracheal adenopathy with node 2.3 cm and SUV 17.7.  Surgical path from 07-30-12 tested for ER PR on 09-06-15, with ER + intermediate in 25-35% and PR + strong in 25-35%.  Biopsy of thyroid nodule benign 09-20-15.  She began tamoxifen/ megace 09-2015 (break during first 3 weeks of tamoxifen for trip overseas).  PET 03-22-16 had increase in the same hypermetabolic paratracheal node and stable right thyroid uptake, no other areas of concern.  Objective:  Vital signs in last 24 hours: Weight 159 lb, 155/70, 72, 18, 98.6, 100% Weight is up 1 lb. Alert, oriented and appropriate. Ambulatory without difficulty, easily able to get on and off exam table.   HEENT:PERRL, sclerae not icteric.  Oral mucosa moist without lesions, posterior pharynx clear.  Neck supple. No JVD.  Lymphatics:no cervical,supraclavicular or inguinal adenopathy Resp: clear to auscultation bilaterally and normal percussion bilaterally Cardio: regular rate and rhythm. No gallop. GI: soft, nontender, not distended, no mass or organomegaly. Normally active bowel sounds. Surgical incision not remarkable. Musculoskeletal/ Extremities: slight kyphosis stable. LE without pitting edema, cords, tenderness Neuro: no peripheral neuropathy. Otherwise nonfocal Skin without rash, ecchymosis, petechiae   Lab Results:  Results for orders placed or performed in visit on 03/23/16  CBC with Differential  Result Value Ref Range   WBC 4.3 3.9 - 10.3 10e3/uL   NEUT# 2.4 1.5 - 6.5 10e3/uL   HGB 13.1 11.6 - 15.9 g/dL   HCT 37.6 34.8 - 46.6 %   Platelets 184 145 - 400 10e3/uL   MCV 95.2 79.5 - 101.0 fL   MCH 33.2 25.1 - 34.0 pg   MCHC 34.8 31.5 - 36.0 g/dL   RBC 3.95 3.70 - 5.45 10e6/uL   RDW 12.2 11.2 - 14.5 %   lymph# 1.4 0.9 - 3.3 10e3/uL   MONO# 0.3 0.1 - 0.9 10e3/uL   Eosinophils Absolute 0.2 0.0 - 0.5 10e3/uL   Basophils Absolute 0.1 0.0 - 0.1 10e3/uL   NEUT% 56.9 38.4 - 76.8 %   LYMPH% 31.6 14.0 - 49.7 %   MONO% 6.1 0.0 - 14.0 %   EOS% 4.0 0.0 - 7.0 %   BASO%  1.4 0.0 - 2.0 %  Comprehensive metabolic panel  Result Value Ref Range   Sodium 140 136 - 145 mEq/L   Potassium 3.7 3.5 - 5.1 mEq/L   Chloride 109 98 - 109 mEq/L   CO2 19 (L) 22 - 29 mEq/L   Glucose 93 70 - 140 mg/dl   BUN 15.1 7.0 - 26.0 mg/dL   Creatinine 0.9 0.6 - 1.1 mg/dL   Total Bilirubin 0.35 0.20 - 1.20 mg/dL   Alkaline Phosphatase 44 40 - 150 U/L   AST 21 5 - 34 U/L   ALT 12 0 - 55 U/L   Total Protein 7.4 6.4 - 8.3 g/dL   Albumin 3.8 3.5 - 5.0 g/dL   Calcium 9.4 8.4 - 10.4 mg/dL   Anion Gap 12 (H) 3 - 11 mEq/L   EGFR 73 (L) >90 ml/min/1.73 m2   CA 125 available after visit is somewhat more elevated at 62, this having been 38 in  12-2015 and 53 in 10-2015.  Studies/Results:  Nm Pet Image Restag (ps) Skull Base To Thigh  Result Date: 03/22/2016 CLINICAL DATA:  Subsequent treatment strategy for endometrial cancer. EXAM: NUCLEAR MEDICINE PET SKULL BASE TO THIGH TECHNIQUE: 7.8 mCi F-18 FDG was injected intravenously. Full-ring PET imaging was performed from the skull base to thigh after the radiotracer. CT data was obtained and used for attenuation correction and anatomic localization. COMPARISON:  08/31/2015 FINDINGS: NECK Stable 2.8 cm right thyroid nodule with SUV max = 13.1. CHEST Previously characterized hypermetabolic right paratracheal lymph node has progressed, measuring 3.0 cm short axis today compared to 2.3 cm previously. SUV max = 20.6 today which compares to 17.7 previously. No other hypermetabolic lymphadenopathy identified in the chest. No suspicious pulmonary nodule or mass. ABDOMEN/PELVIS No abnormal hypermetabolic activity within the liver, pancreas, adrenal glands, or spleen. The left adrenal hypermetabolism seen previously has resolved. No hypermetabolic lymph nodes in the abdomen or pelvis. Specifically, no evidence for para-aortic lymphadenopathy. SKELETON No focal hypermetabolic activity to suggest skeletal metastasis. IMPRESSION: 1. Interval progression of hypermetabolic right paratracheal lymph node consistent with metastatic involvement. 2. Stable hypermetabolic right thyroid nodule. Reportedly this has been biopsied in the past. Electronically Signed   By: Misty Stanley M.D.   On: 03/22/2016 13:02   PACs images reviewed with patient   Medications: I have reviewed the patient's current medications. Flu vaccine done 02-2016  DISCUSSION  Discussed PET information as above. Patient understands that the paratracheal node could be biopsied by thoracic surgery, however I do not think it would be wrong to consider local radiation there without biopsy, particularly as she had such a good response to IMRT to biopsy  proven retroperitoneal nodes in 2016, which again are no longer hypermetabolic on this PET. Will ask Dr Sondra Come to see her for possible treatment of the paratracheal node.  With no other evidence of disease, I would be in favor of continuing the alternating tamoxifen and megace.   Discussed use of Foundation One information, which is frequently helpful if looking for early phase trials.   Will let her know CA 125. I think reasonable to follow this continuing tamoxifen/ megace and with possible radiation as above.   Assessment/Plan:  1.recurrent high grade serous endometrial carcinoma involving para-aortic and common iliac nodes 22 months from completion of adjuvant chemotherapy for IA poorly differentiated endometrial adenocarcinoma, ER PR +. Even dose reduced adjuvant carboplatin and weekly taxol with granix was difficult due to low counts; PET after initial chemo only uptake in  a left periaortic node (+thyroid where previously biopsied). Completed IMRT to retroperitoneal nodes 05-13-15. CT AP 06-17-15 no apparent residual or progressive disease. With some increase in CA125, PET obtained 08-31-15 with uptake right thyroid and paratracheal node, none in AP. Alternating tamoxifen and megace begun 10-2015 Right paratracheal node  hypermetabolic on PET 5-80-06 and increased in size and activity on PET 03-22-16. WIll ask Dr Clabe Seal opinion of directed radiation there. Continue tamoxifen/ megace now. Foundation One testing done 11-2015.   2.Right thyroid nodule: hypermetabolic on PET, biopsy in [facility] system 10-2014 no malignancy. Repeat US biopsy by IR 09-20-15 also benign. TFTs normal. Repeat US done by Dr Forde Dandy 02-10-16, unchanged.  3.intentional weight loss of 70 lbs in 2015. Appetite increased with megace and some weight gain. Encouraged resuming walking 30 min daily outdoors or treadmill. 4.chronic constipation, which was a concern during chemotherapy. Up to date on colonoscopy.  5..BP good without  antihypertensives now, follow. Likely improved with weight loss  6.elevated lipids 7remote minimal tobacco, not enough to qualify for the lung cancer screening program 8.up to date mammograms 9. flu vaccine 02-2016 10.history of low bone density by outside bone scan, for DEXA 03-10-16   All questions answered. We will let her know CA 125 results; I will update Drs Polly Cobia, Carlyle Dolly. CC Dr Sabra Heck and Dr Forde Dandy including PET. Time spent 25 min including >50% counseling and coordination of care.      Evlyn Clines, MD   03/23/2016, 1:22 PM

## 2016-03-24 ENCOUNTER — Telehealth: Payer: Self-pay

## 2016-03-24 ENCOUNTER — Other Ambulatory Visit: Payer: Self-pay | Admitting: Oncology

## 2016-03-24 DIAGNOSIS — C541 Malignant neoplasm of endometrium: Secondary | ICD-10-CM

## 2016-03-24 LAB — CA 125: Cancer Antigen (CA) 125: 62.4 U/mL — ABNORMAL HIGH (ref 0.0–38.1)

## 2016-03-24 NOTE — Telephone Encounter (Signed)
S/w pt per Dr Mariana Kaufman attached message. She was understanding. She will call us middle of next week if does not hear about referral to Dr Sondra Come.

## 2016-03-24 NOTE — Telephone Encounter (Signed)
-----   Message from [name], MD sent at 03/24/2016  4:10 PM EDT ----- Please let her know CA 125 marker was a little higher at 62, may be reflecting the paratracheal node. I think asking Dr Sondra Come about radiation is still correct plan, and I think continuing tamoxifen/ megace for now still ok with PET information that we have.  thanks

## 2016-03-27 ENCOUNTER — Telehealth: Payer: Self-pay | Admitting: *Deleted

## 2016-03-27 ENCOUNTER — Other Ambulatory Visit: Payer: Self-pay | Admitting: Oncology

## 2016-03-27 DIAGNOSIS — C771 Secondary and unspecified malignant neoplasm of intrathoracic lymph nodes: Secondary | ICD-10-CM

## 2016-03-27 DIAGNOSIS — C541 Malignant neoplasm of endometrium: Secondary | ICD-10-CM

## 2016-03-27 NOTE — Telephone Encounter (Signed)
Pt notified of message below. Verbalized understanding 

## 2016-03-27 NOTE — Telephone Encounter (Signed)
-----   Message from [name], MD sent at 03/24/2016  4:10 PM EDT ----- Please let her know CA 125 marker was a little higher at 62, may be reflecting the paratracheal node. I think asking Dr Sondra Come about radiation is still correct plan, and I think continuing tamoxifen/ megace for now still ok with PET information that we have.  thanks

## 2016-04-03 ENCOUNTER — Other Ambulatory Visit: Payer: 59

## 2016-04-03 ENCOUNTER — Ambulatory Visit: Payer: 59 | Admitting: Oncology

## 2016-04-05 ENCOUNTER — Encounter: Payer: Self-pay | Admitting: Radiation Oncology

## 2016-04-05 ENCOUNTER — Ambulatory Visit
Admission: RE | Admit: 2016-04-05 | Discharge: 2016-04-05 | Disposition: A | Discharge status: Home / Self Care | Payer: 59 | Source: Ambulatory Visit | Attending: Radiation Oncology | Admitting: Radiation Oncology

## 2016-04-05 DIAGNOSIS — Z8542 Personal history of malignant neoplasm of other parts of uterus: Secondary | ICD-10-CM | POA: Diagnosis not present

## 2016-04-05 DIAGNOSIS — Z5111 Encounter for antineoplastic chemotherapy: Secondary | ICD-10-CM | POA: Diagnosis not present

## 2016-04-05 DIAGNOSIS — Z923 Personal history of irradiation: Secondary | ICD-10-CM | POA: Diagnosis not present

## 2016-04-05 DIAGNOSIS — Z79899 Other long term (current) drug therapy: Secondary | ICD-10-CM | POA: Diagnosis not present

## 2016-04-05 DIAGNOSIS — E041 Nontoxic single thyroid nodule: Secondary | ICD-10-CM | POA: Insufficient documentation

## 2016-04-05 DIAGNOSIS — C771 Secondary and unspecified malignant neoplasm of intrathoracic lymph nodes: Secondary | ICD-10-CM | POA: Diagnosis not present

## 2016-04-05 NOTE — Progress Notes (Signed)
Radiation Oncology         [phone]) [phone] ________________________________  Name: Diane Lozano MRN: [id number]  Date: 04/05/2016  DOB: September 06, 1955  Re-Consulation Visit Note  CC: Diane Solo, MD  Diane Levan, MD    ICD-9-CM ICD-10-CM   1. Metastatic cancer to intrathoracic lymph nodes (HCC) 196.1 C77.1     Diagnosis: Recurrent endometrial cancer (high-grade serous adenocarcinoma) with metastasis to the retroperitoneal lymph nodes, now with isolated intrathoracic nodal recurrence   Interval Since Last Radiation: 11 months  October 31 through May 13, 2015: PET positive retroperitoneal nodes 50.4 gray in 28 fractions with simultaneous integrated boost to 56 gray  May 27, June 5,  June 11, June 19, November 27, 2012: Proximal vagina 30 Gy in 5 fractions  Narrative:  The patient returns today for a re-consultation. The patient had a re-staging PET scan on 03/05/16. This revealed interval progression of a hypermetabolic right paratracheal lymph node consistent with metastatic involvement (measuring 3.0 cm from 2.3 cm and SUV max 20.6 from 17.7 on the prior PET scan on 08/31/15. It also noted a stable 2.8 cm right thyroid nodule with SUV max = 13.1. There was no hypermetabolic lymph nodes in the abdomen or pelvis. There was no evidence for para-aortic lymphadenopathy. The left adrenal hypermetabolism resolved.  Recent CA 125 is up to 62.4 U/ml from a prior reading of 38.40months ago  The patient followed up with Dr. Marko Lozano on 03/23/16. The patient is alternating Megace and Tamoxifen since May 2017. The patient presents today to discuss radiation to the hypermetabolic right paratracheal lymph node. The patient denies pain at this time, but reports headaches at the end of the day which she believes is from her elevated BP.  She denies any cough. She denies any pain in the chest. She denies any shortness of breath or esophageal symptoms  ALLERGIES:  has No Known Allergies.  Meds: Current  Outpatient Prescriptions  Medication Sig Dispense Refill  . Calcium Carb-Cholecalciferol 500-600 MG-UNIT TABS Take 1 tablet by mouth daily.    . Cholecalciferol (VITAMIN D3) 2000 units TABS Take 1 tablet by mouth daily.    . megestrol (MEGACE) 40 MG tablet Take 2 tablets (80 mg total) by mouth 2 (two) times daily. X 3 weeks alternating with tamoxifen 84 tablet 3  . simvastatin (ZOCOR) 40 MG tablet Take 40 mg by mouth every evening.    . tamoxifen (NOLVADEX) 20 MG tablet Take 1 tablet (20 mg total) by mouth 2 (two) times daily. X 3 weeks alternating with Megace. 42 tablet 3  . venlafaxine XR (EFFEXOR-XR) 75 MG 24 hr capsule Take 75 mg by mouth daily.    Marland Kitchen ALPRAZolam (XANAX) 0.25 MG tablet Take 1 tablet (0.25 mg total) by mouth every 8 (eight) hours as needed (for severe anxiety). (Patient not taking: Reported on 04/05/2016) 30 tablet 0  . LORazepam (ATIVAN) 1 MG tablet Place 1/2 -1 tablet under the tongue or swallow every 6 hrs as needed for nausea.  Will make drowsy. (Patient not taking: Reported on 04/05/2016) 30 tablet 1  . ondansetron (ZOFRAN) 8 MG tablet Take 1 tablet (8 mg total) by mouth every 8 (eight) hours as needed for nausea or vomiting. (Patient not taking: Reported on 04/05/2016) 30 tablet 2  . prochlorperazine (COMPAZINE) 10 MG tablet Take 1 tablet (10 mg total) by mouth every 6 (six) hours as needed for nausea or vomiting. (Patient not taking: Reported on 04/05/2016) 30 tablet 1   No current facility-administered medications  for this encounter.     Physical Findings: The patient is in no acute distress. Patient is alert and oriented.  height is 5\' 3"  (1.6 m) and weight is 160 lb 6.4 oz (72.8 kg). Her oral temperature is 98.5 F (36.9 C). Her blood pressure is 149/87 (abnormal) and her pulse is 70. Her oxygen saturation is 100%.   Lungs are clear to auscultation bilaterally. Heart has regular rate and rhythm. No palpable supraclavicular or axillary adenopathy. Abdomen soft, non-tender,  normal bowel sounds.  Lab Findings: Lab Results  Component Value Date   WBC 4.3 03/23/2016   HGB 13.1 03/23/2016   HCT 37.6 03/23/2016   MCV 95.2 03/23/2016   PLT 184 03/23/2016    Radiographic Findings: Nm Pet Image Restag (ps) Skull Base To Thigh  Result Date: 03/22/2016 CLINICAL DATA:  Subsequent treatment strategy for endometrial cancer. EXAM: NUCLEAR MEDICINE PET SKULL BASE TO THIGH TECHNIQUE: 7.8 mCi F-18 FDG was injected intravenously. Full-ring PET imaging was performed from the skull base to thigh after the radiotracer. CT data was obtained and used for attenuation correction and anatomic localization. COMPARISON:  08/31/2015 FINDINGS: NECK Stable 2.8 cm right thyroid nodule with SUV max = 13.1. CHEST Previously characterized hypermetabolic right paratracheal lymph node has progressed, measuring 3.0 cm short axis today compared to 2.3 cm previously. SUV max = 20.6 today which compares to 17.7 previously. No other hypermetabolic lymphadenopathy identified in the chest. No suspicious pulmonary nodule or mass. ABDOMEN/PELVIS No abnormal hypermetabolic activity within the liver, pancreas, adrenal glands, or spleen. The left adrenal hypermetabolism seen previously has resolved. No hypermetabolic lymph nodes in the abdomen or pelvis. Specifically, no evidence for para-aortic lymphadenopathy. SKELETON No focal hypermetabolic activity to suggest skeletal metastasis. IMPRESSION: 1. Interval progression of hypermetabolic right paratracheal lymph node consistent with metastatic involvement. 2. Stable hypermetabolic right thyroid nodule. Reportedly this has been biopsied in the past. Electronically Signed   By: Diane Lozano M.D.   On: 03/22/2016 13:02   Dg Bone Density  Result Date: 03/10/2016 EXAM: DUAL X-RAY ABSORPTIOMETRY (DXA) FOR BONE MINERAL DENSITY IMPRESSION: Referring Physician:  Kathyrn Lozano PATIENT: Name: Diane, Lozano Patient ID: [id number] Birth Date: 12/10/1955 Height: 63.0 in. Sex:  Female Measured: 03/10/2016 Weight: 162.0 lbs. Indications: Bilateral Ovariectomy (65.51), Caucasian, Effexor, Estrogen Deficient, History of Osteopenia, Hx of tobacco use, Hysterectomy, Metastatic disease, Postmenopausal, Tamoxifen, Secondary Osteoporosis Fractures: None Treatments: Calcium (E943.0), Hormone Therapy For Cancer, Vitamin D (E933.5) ASSESSMENT: The BMD measured at AP Spine L1-L4 is 0.979 g/cm2 with a T-score of -1.7. This patient's diagnostic category is considered LOW BONE MASS/OSTEOPENIA according to [facility]) criteria. Per the official positions of the ISCD, it is not possible to quantitatively compare BMD or calculate an [facility] between exams done at different facilities or on different devices. Site Region Measured Date Measured Age YA BMD Significant CHANGE T-score AP Spine  L1-L4     03/10/2016    62.6         -1.7    0.979 g/cm2 DualFemur Neck Left 03/10/2016    62.6         -1.5    0.826 g/cm2 World Health Organization [facility]) criteria for post-menopausal, Caucasian Women: Normal       T-score at or above -1 SD Osteopenia   T-score between -1 and -2.5 SD Osteoporosis T-score at or below -2.5 SD RECOMMENDATION: [place] recommends that FDA-approved medical therapies be considered in postmenopausal women and men age 20 or older with a:  1. Hip or vertebral (clinical or morphometric) fracture. 2. T-score of <-2.5 at the spine or hip. 3. Ten-year fracture probability by FRAX of 3% or greater for hip fracture or 20% or greater for major osteoporotic fracture. All treatment decisions require clinical judgment and consideration of individual patient factors, including patient preferences, co-morbidities, previous drug use, risk factors not captured in the FRAX model (e.g. falls, vitamin D deficiency, increased bone turnover, interval significant decline in bone density) and possible under - or over-estimation of fracture risk by FRAX. All patients should ensure  an adequate intake of dietary calcium (1200 mg/d) and vitamin D (800 IU daily) unless contraindicated. FOLLOW-UP: People with diagnosed cases of osteoporosis or at high risk for fracture should have regular bone mineral density tests. For patients eligible for Medicare, routine testing is allowed once every 2 years. The testing frequency can be increased to one year for patients who have rapidly progressing disease, those who are receiving or discontinuing medical therapy to restore bone mass, or have additional risk factors. I have reviewed this report, and agree with the above findings. [facility] FRAX* 10-year Probability of Fracture Based on femoral neck BMD: DualFemur (Left) Major Osteoporotic Fracture: 8.5% Hip Fracture:                0.8% Population:                  [place] (Caucasian) Risk Factors:                Secondary Osteoporosis *FRAX is a Materials engineer of the [employer] of [employer] for Metabolic Bone Disease, a [place] (WHO) [facility]. ASSESSMENT: The probability of a major osteoporotic fracture is 8.5% within the next ten years. The probability of a hip fracture is 0.8% within the next ten years. Electronically Signed   By: Margarette [place] M.D.   On: 03/10/2016 10:30    Impression:  Isolated intrathoracic nodal recurrence. Recent PET scan on 03/22/16 showed the progression of a right paratracheal hypermetabolic lymph node that is consistent with nodal metastasis and a persistent hypermetabolic right thyroid nodule. However, biopsy of the hypermetabolic thyroid nodule was benign. The treated abdominal retroperitoneal hypermetabolic node has resolved. Patient has tolerated alternating tamoxifen and Megace well but this therapy does not appear to be improving her right paratracheal mass. Patient would be a good candidate for aggressive palliative treatment directed at this paratracheal recurrence since this is the only active area on recent PET  scan.   Plan: We discussed a biopsy, but the patient is comfortable proceeding without tissue confirmation. CT simulation is scheduled tomorrow at [place] between 5 and 6 weeks of radiation therapy directed at the right paratracheal mass. ____________________________________ -----------------------------------  Blair Promise, PhD, MD  This document serves as a record of services personally performed by Gery Pray, MD. It was created on his behalf by Darcus Austin, a trained medical scribe. The creation of this record is based on the scribe's personal observations and the provider's statements to them. This document has been checked and approved by the attending provider.

## 2016-04-05 NOTE — Progress Notes (Signed)
Please see the Nurse Progress Note in the MD Initial Consult Encounter for this patient. 

## 2016-04-05 NOTE — Progress Notes (Signed)
Histology and Location of Primary Cancer: recurrent endometrial carcinoma  Location(s) of Symptomatic tumor(s): paratracheal node - PET scan from 03/22/16 showed "Interval progression of hypermetabolic right paratracheal lymph node consistent with metastatic involvement."  Past/Anticipated chemotherapy by medical oncology, if any: alternating megace and tamoxifen since May 2017   Pain on a scale of 0-10 is: 0  SAFETY ISSUES:  Prior radiation? 04/05/15 - 05/13/15 retroperitoneal nodes 50.4 gray boosted to 56 gray, 5/27, 6/5, 6/11, 6/19, 11/27/2012 proximal vagina 30 Gy in 5 fx, HDR    Pacemaker/ICD? no  Possible current pregnancy? no  Is the patient on methotrexate? no  Additional Complaints / other details:  Patient mentioned that she is having headaches at the end of the day which she thinks is from her elevated BP.  BP (!) 149/87 (BP Location: Left Arm, Patient Position: Sitting)   Pulse 70   Temp 98.5 F (36.9 C) (Oral)   Ht 5\' 3"  (1.6 m)   Wt 160 lb 6.4 oz (72.8 kg)   SpO2 100%   BMI 28.41 kg/m    Wt Readings from Last 3 Encounters:  04/05/16 160 lb 6.4 oz (72.8 kg)  03/23/16 159 lb 4.8 oz (72.3 kg)  12/16/15 158 lb 12.8 oz (72 kg)

## 2016-04-06 ENCOUNTER — Ambulatory Visit
Admission: RE | Admit: 2016-04-06 | Discharge: 2016-04-06 | Disposition: A | Discharge status: Home / Self Care | Payer: 59 | Source: Ambulatory Visit | Attending: Radiation Oncology | Admitting: Radiation Oncology

## 2016-04-06 VITALS — BP 117/80 | HR 78 | Temp 98.3°F | Ht 63.0 in | Wt 160.1 lb

## 2016-04-06 DIAGNOSIS — E041 Nontoxic single thyroid nodule: Secondary | ICD-10-CM | POA: Diagnosis not present

## 2016-04-06 DIAGNOSIS — C771 Secondary and unspecified malignant neoplasm of intrathoracic lymph nodes: Secondary | ICD-10-CM

## 2016-04-06 DIAGNOSIS — Z5111 Encounter for antineoplastic chemotherapy: Secondary | ICD-10-CM | POA: Diagnosis not present

## 2016-04-06 DIAGNOSIS — Z79899 Other long term (current) drug therapy: Secondary | ICD-10-CM | POA: Diagnosis not present

## 2016-04-06 MED ORDER — SODIUM CHLORIDE 0.9 % IJ SOLN
10.0000 mL | Freq: Once | INTRAMUSCULAR | Status: AC
  Administered 2016-04-06: 10 mL via INTRAVENOUS

## 2016-04-06 NOTE — Progress Notes (Signed)
Does patient have an allergy to IV contrast dye?: No.   Has patient ever received premedication for IV contrast dye?: No.   Does patient take metformin?: No.  If patient does take metformin when was the last dose: n/a  Date of lab work: March 23, 2016 BUN: 15.1  CR: 0.9  IV site: hand   There were no vitals taken for this visit.

## 2016-04-06 NOTE — Progress Notes (Signed)
IV deaccessed at 14:25pm in simulation. Flushed with NS prior to Deaccess.  Site without any redness.  She denies any pain. Site covered with folded 2x2 and adhered with paper tape.  Released to home.

## 2016-04-06 NOTE — Progress Notes (Signed)
Does patient have an allergy to IV contrast dye?: No.   Has patient ever received premedication for IV contrast dye?: No.   Does patient take metformin?: No.  If patient does take metformin when was the last dose: N/A  Date of lab work: March 23, 2016 BUN: 15.1  CR:0.9  IV site: Right hand near behind thumb region  There were no vitals taken for this visit.: Yes

## 2016-04-10 DIAGNOSIS — C771 Secondary and unspecified malignant neoplasm of intrathoracic lymph nodes: Secondary | ICD-10-CM | POA: Diagnosis not present

## 2016-04-10 DIAGNOSIS — Z5111 Encounter for antineoplastic chemotherapy: Secondary | ICD-10-CM | POA: Diagnosis not present

## 2016-04-10 DIAGNOSIS — E041 Nontoxic single thyroid nodule: Secondary | ICD-10-CM | POA: Diagnosis not present

## 2016-04-10 DIAGNOSIS — Z79899 Other long term (current) drug therapy: Secondary | ICD-10-CM | POA: Diagnosis not present

## 2016-04-10 NOTE — Progress Notes (Signed)
  Radiation Oncology         [phone]) [phone] ________________________________  Name: Diane Lozano MRN: [id number]  Date: 04/06/2016  DOB: 21-Sep-1955  SIMULATION AND TREATMENT PLANNING NOTE    ICD-9-CM ICD-10-CM   1. Metastatic cancer to intrathoracic lymph nodes (HCC) 196.1 C77.1     DIAGNOSIS: Recurrent endometrial cancer (high-grade serous adenocarcinoma) with metastasis to the retroperitoneal lymph nodes, now with isolated intrathoracic nodal recurrence     NARRATIVE:  The patient was brought to the [facility].  Identity was confirmed.  All relevant records and images related to the planned course of therapy were reviewed.  The patient freely provided informed written consent to proceed with treatment after reviewing the details related to the planned course of therapy. The consent form was witnessed and verified by the simulation staff.  Then, the patient was set-up in a stable reproducible  supine position for radiation therapy.  CT images were obtained.  Surface markings were placed.  The CT images were loaded into the planning software.  Then the target and avoidance structures were contoured.  Treatment planning then occurred.  The radiation prescription was entered and confirmed.  Then, I designed and supervised the construction of a total of 5 medically necessary complex treatment devices.  I have requested : 3D Simulation  I have requested a DVH of the following structures: GTV, PTV, heart, lungs, esophagus..  I have ordered:dose calc.  PLAN:  The patient will receive 56 Gy in 28 fractions.  -----------------------------------  Blair Promise, PhD, MD

## 2016-04-13 ENCOUNTER — Ambulatory Visit
Admission: RE | Admit: 2016-04-13 | Discharge: 2016-04-13 | Disposition: A | Discharge status: Home / Self Care | Payer: 59 | Source: Ambulatory Visit | Attending: Radiation Oncology | Admitting: Radiation Oncology

## 2016-04-13 ENCOUNTER — Other Ambulatory Visit: Payer: Self-pay | Admitting: Oncology

## 2016-04-13 DIAGNOSIS — Z5111 Encounter for antineoplastic chemotherapy: Secondary | ICD-10-CM | POA: Diagnosis not present

## 2016-04-13 DIAGNOSIS — Z79899 Other long term (current) drug therapy: Secondary | ICD-10-CM | POA: Diagnosis not present

## 2016-04-13 DIAGNOSIS — E041 Nontoxic single thyroid nodule: Secondary | ICD-10-CM | POA: Diagnosis not present

## 2016-04-13 DIAGNOSIS — C541 Malignant neoplasm of endometrium: Secondary | ICD-10-CM

## 2016-04-13 DIAGNOSIS — C771 Secondary and unspecified malignant neoplasm of intrathoracic lymph nodes: Secondary | ICD-10-CM

## 2016-04-13 DIAGNOSIS — C772 Secondary and unspecified malignant neoplasm of intra-abdominal lymph nodes: Secondary | ICD-10-CM

## 2016-04-13 MED FILL — MEGESTROL 40 MG TABLET: 40 | 21 days supply | Qty: 84 | Fill #0

## 2016-04-13 MED FILL — TAMOXIFEN 20 MG TABLET: 20 | 21 days supply | Qty: 42 | Fill #1

## 2016-04-13 NOTE — Progress Notes (Signed)
  Radiation Oncology         [phone] ________________________________  Name: Diane Lozano MRN: [id number]  Date: 04/13/2016  DOB: 1955-07-14  Simulation Verification Note    ICD-9-CM ICD-10-CM   1. 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 The clinical setup was verified. Then port films were obtained and uploaded to the radiation oncology medical record software.  The treatment beams were carefully compared against the planned radiation fields. The position location and shape of the radiation fields was reviewed. They targeted volume of tissue appears to be appropriately covered by the radiation beams. Organs at risk appear to be excluded as planned.  Based on my personal review, I approved the simulation verification. The patient's treatment will proceed as planned.  -----------------------------------  Blair Promise, PhD, MD  This document serves as a record of services personally performed by Gery Pray, MD. It was created on his behalf by Bethann Humble, a trained medical scribe. The creation of this record is based on the scribe's personal observations and the provider's statements to them. This document has been checked and approved by the attending provider.

## 2016-04-14 ENCOUNTER — Ambulatory Visit
Admission: RE | Admit: 2016-04-14 | Discharge: 2016-04-14 | Disposition: A | Discharge status: Home / Self Care | Payer: 59 | Source: Ambulatory Visit | Attending: Radiation Oncology | Admitting: Radiation Oncology

## 2016-04-14 DIAGNOSIS — Z5111 Encounter for antineoplastic chemotherapy: Secondary | ICD-10-CM | POA: Diagnosis not present

## 2016-04-14 DIAGNOSIS — E041 Nontoxic single thyroid nodule: Secondary | ICD-10-CM | POA: Diagnosis not present

## 2016-04-14 DIAGNOSIS — Z79899 Other long term (current) drug therapy: Secondary | ICD-10-CM | POA: Diagnosis not present

## 2016-04-14 DIAGNOSIS — C771 Secondary and unspecified malignant neoplasm of intrathoracic lymph nodes: Secondary | ICD-10-CM | POA: Diagnosis not present

## 2016-04-17 ENCOUNTER — Ambulatory Visit
Admission: RE | Admit: 2016-04-17 | Discharge: 2016-04-17 | Disposition: A | Discharge status: Home / Self Care | Payer: 59 | Source: Ambulatory Visit | Attending: Radiation Oncology | Admitting: Radiation Oncology

## 2016-04-17 DIAGNOSIS — E041 Nontoxic single thyroid nodule: Secondary | ICD-10-CM | POA: Diagnosis not present

## 2016-04-17 DIAGNOSIS — C771 Secondary and unspecified malignant neoplasm of intrathoracic lymph nodes: Secondary | ICD-10-CM | POA: Diagnosis not present

## 2016-04-17 DIAGNOSIS — Z5111 Encounter for antineoplastic chemotherapy: Secondary | ICD-10-CM | POA: Diagnosis not present

## 2016-04-17 DIAGNOSIS — Z79899 Other long term (current) drug therapy: Secondary | ICD-10-CM | POA: Diagnosis not present

## 2016-04-18 ENCOUNTER — Encounter: Payer: Self-pay | Admitting: Radiation Oncology

## 2016-04-18 ENCOUNTER — Ambulatory Visit
Admission: RE | Admit: 2016-04-18 | Discharge: 2016-04-18 | Disposition: A | Discharge status: Home / Self Care | Payer: 59 | Source: Ambulatory Visit | Attending: Radiation Oncology | Admitting: Radiation Oncology

## 2016-04-18 VITALS — BP 143/94 | HR 72 | Temp 98.1°F | Ht 63.0 in | Wt 167.4 lb

## 2016-04-18 DIAGNOSIS — Z5111 Encounter for antineoplastic chemotherapy: Secondary | ICD-10-CM | POA: Diagnosis not present

## 2016-04-18 DIAGNOSIS — Z79899 Other long term (current) drug therapy: Secondary | ICD-10-CM | POA: Diagnosis not present

## 2016-04-18 DIAGNOSIS — E041 Nontoxic single thyroid nodule: Secondary | ICD-10-CM | POA: Diagnosis not present

## 2016-04-18 DIAGNOSIS — C771 Secondary and unspecified malignant neoplasm of intrathoracic lymph nodes: Secondary | ICD-10-CM

## 2016-04-18 NOTE — Progress Notes (Signed)
Ms. Wyche is here for her 4th fraction of radiation to her Chest. She denies pain. She admits to some fatigue. She denies any other concerns at this time.   BP (!) 143/94   Pulse 72   Temp 98.1 F (36.7 C)   Ht 5\' 3"  (1.6 m)   Wt 167 lb 6.4 oz (75.9 kg)   SpO2 98% Comment: room air  BMI 29.65 kg/m    Wt Readings from Last 3 Encounters:  04/18/16 167 lb 6.4 oz (75.9 kg)  04/06/16 160 lb 1.6 oz (72.6 kg)  04/05/16 160 lb 6.4 oz (72.8 kg)

## 2016-04-18 NOTE — Progress Notes (Signed)
  Radiation Oncology         (336) [phone] ________________________________  Name: Diane Lozano MRN: [id number]  Date: 04/18/2016  DOB: 1956-05-24  Weekly Radiation Therapy Management    ICD-9-CM ICD-10-CM   1. 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 . . . . . . . The patient presents for routine under treatment assessment.                                   The patient is without complaint. Some mild fatigue. No esophageal symptoms                                 Set-up films were reviewed.                                 The chart was checked. Physical Findings. . .  height is 5\' 3"  (1.6 m) and weight is 167 lb 6.4 oz (75.9 kg). Her temperature is 98.1 F (36.7 C). Her blood pressure is 143/94 (abnormal) and her pulse is 72. Her oxygen saturation is 98%. . Weight essentially stable.  No significant changes.  Lungs are clear to auscultation bilaterally. Heart has regular rate and rhythm. No palpable cervical, supraclavicular, or axillary adenopathy. Abdomen soft, non-tender, normal bowel sounds.  Impression . . . . . . . The patient is tolerating radiation. Plan . . . . . . . . . . . . Continue treatment as planned.  ________________________________   Blair Promise, PhD, MD

## 2016-04-19 ENCOUNTER — Ambulatory Visit
Admission: RE | Admit: 2016-04-19 | Discharge: 2016-04-19 | Disposition: A | Discharge status: Home / Self Care | Payer: 59 | Source: Ambulatory Visit | Attending: Radiation Oncology | Admitting: Radiation Oncology

## 2016-04-19 DIAGNOSIS — Z5111 Encounter for antineoplastic chemotherapy: Secondary | ICD-10-CM | POA: Diagnosis not present

## 2016-04-19 DIAGNOSIS — E041 Nontoxic single thyroid nodule: Secondary | ICD-10-CM | POA: Diagnosis not present

## 2016-04-19 DIAGNOSIS — C771 Secondary and unspecified malignant neoplasm of intrathoracic lymph nodes: Secondary | ICD-10-CM | POA: Diagnosis not present

## 2016-04-19 DIAGNOSIS — Z79899 Other long term (current) drug therapy: Secondary | ICD-10-CM | POA: Diagnosis not present

## 2016-04-20 ENCOUNTER — Ambulatory Visit
Admission: RE | Admit: 2016-04-20 | Discharge: 2016-04-20 | Disposition: A | Discharge status: Home / Self Care | Payer: 59 | Source: Ambulatory Visit | Attending: Radiation Oncology | Admitting: Radiation Oncology

## 2016-04-20 DIAGNOSIS — E041 Nontoxic single thyroid nodule: Secondary | ICD-10-CM | POA: Diagnosis not present

## 2016-04-20 DIAGNOSIS — Z79899 Other long term (current) drug therapy: Secondary | ICD-10-CM | POA: Diagnosis not present

## 2016-04-20 DIAGNOSIS — Z5111 Encounter for antineoplastic chemotherapy: Secondary | ICD-10-CM | POA: Diagnosis not present

## 2016-04-20 DIAGNOSIS — C771 Secondary and unspecified malignant neoplasm of intrathoracic lymph nodes: Secondary | ICD-10-CM | POA: Diagnosis not present

## 2016-04-21 ENCOUNTER — Ambulatory Visit
Admission: RE | Admit: 2016-04-21 | Discharge: 2016-04-21 | Disposition: A | Discharge status: Home / Self Care | Payer: 59 | Source: Ambulatory Visit | Attending: Radiation Oncology | Admitting: Radiation Oncology

## 2016-04-21 DIAGNOSIS — C771 Secondary and unspecified malignant neoplasm of intrathoracic lymph nodes: Secondary | ICD-10-CM | POA: Diagnosis not present

## 2016-04-21 DIAGNOSIS — Z5111 Encounter for antineoplastic chemotherapy: Secondary | ICD-10-CM | POA: Diagnosis not present

## 2016-04-21 DIAGNOSIS — Z79899 Other long term (current) drug therapy: Secondary | ICD-10-CM | POA: Diagnosis not present

## 2016-04-21 DIAGNOSIS — E041 Nontoxic single thyroid nodule: Secondary | ICD-10-CM | POA: Diagnosis not present

## 2016-04-23 ENCOUNTER — Ambulatory Visit
Admission: RE | Admit: 2016-04-23 | Discharge: 2016-04-23 | Disposition: A | Discharge status: Home / Self Care | Payer: 59 | Source: Ambulatory Visit | Attending: Radiation Oncology | Admitting: Radiation Oncology

## 2016-04-23 DIAGNOSIS — Z5111 Encounter for antineoplastic chemotherapy: Secondary | ICD-10-CM | POA: Diagnosis not present

## 2016-04-23 DIAGNOSIS — Z79899 Other long term (current) drug therapy: Secondary | ICD-10-CM | POA: Diagnosis not present

## 2016-04-23 DIAGNOSIS — E041 Nontoxic single thyroid nodule: Secondary | ICD-10-CM | POA: Diagnosis not present

## 2016-04-23 DIAGNOSIS — C771 Secondary and unspecified malignant neoplasm of intrathoracic lymph nodes: Secondary | ICD-10-CM | POA: Diagnosis not present

## 2016-04-24 ENCOUNTER — Ambulatory Visit
Admission: RE | Admit: 2016-04-24 | Discharge: 2016-04-24 | Disposition: A | Discharge status: Home / Self Care | Payer: 59 | Source: Ambulatory Visit | Attending: Radiation Oncology | Admitting: Radiation Oncology

## 2016-04-24 DIAGNOSIS — Z79899 Other long term (current) drug therapy: Secondary | ICD-10-CM | POA: Diagnosis not present

## 2016-04-24 DIAGNOSIS — C771 Secondary and unspecified malignant neoplasm of intrathoracic lymph nodes: Secondary | ICD-10-CM | POA: Diagnosis not present

## 2016-04-24 DIAGNOSIS — E041 Nontoxic single thyroid nodule: Secondary | ICD-10-CM | POA: Diagnosis not present

## 2016-04-24 DIAGNOSIS — Z5111 Encounter for antineoplastic chemotherapy: Secondary | ICD-10-CM | POA: Diagnosis not present

## 2016-04-25 ENCOUNTER — Ambulatory Visit
Admission: RE | Admit: 2016-04-25 | Discharge: 2016-04-25 | Disposition: A | Discharge status: Home / Self Care | Payer: 59 | Source: Ambulatory Visit | Attending: Radiation Oncology | Admitting: Radiation Oncology

## 2016-04-25 ENCOUNTER — Encounter: Payer: Self-pay | Admitting: Radiation Oncology

## 2016-04-25 VITALS — BP 140/82 | HR 72 | Temp 98.9°F | Ht 63.0 in | Wt 159.8 lb

## 2016-04-25 DIAGNOSIS — E041 Nontoxic single thyroid nodule: Secondary | ICD-10-CM | POA: Diagnosis not present

## 2016-04-25 DIAGNOSIS — Z79899 Other long term (current) drug therapy: Secondary | ICD-10-CM | POA: Diagnosis not present

## 2016-04-25 DIAGNOSIS — C771 Secondary and unspecified malignant neoplasm of intrathoracic lymph nodes: Secondary | ICD-10-CM

## 2016-04-25 DIAGNOSIS — Z5111 Encounter for antineoplastic chemotherapy: Secondary | ICD-10-CM | POA: Diagnosis not present

## 2016-04-25 NOTE — Progress Notes (Signed)
  Radiation Oncology         [phone] ________________________________  Name: Diane Lozano MRN: [id number]  Date: 04/25/2016  DOB: May 28, 1956  Weekly Radiation Therapy Management    ICD-9-CM ICD-10-CM   1. 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 . . . . . . . The patient presents for routine under treatment assessment.                                   The patient has completed 10 fractions to her chest. She denies pain. The patient does report fatigue. She reports occasional pain like heartburn in her right central chest; she says the feeling is "really intermittent." She denies having a sore throat or feeling like food is stuck in her throat. The patient denies having a cough.                                 Set-up films were reviewed.                                 The chart was checked. Physical Findings. . .  height is 5\' 3"  (1.6 m) and weight is 159 lb 12.8 oz (72.5 kg). Her oral temperature is 98.9 F (37.2 C). Her blood pressure is 140/82 and her pulse is 72. Her oxygen saturation is 100%. . Weight essentially stable.  No significant changes.  Lungs are clear to auscultation bilaterally. Heart has regular rate and rhythm. No palpable cervical, supraclavicular, or axillary adenopathy. Abdomen soft, non-tender, normal bowel sounds.  Impression . . . . . . . The patient is tolerating radiation. Plan . . . . . . . . . . . . Continue treatment as planned.  ________________________________   Blair Promise, PhD, MD  This document serves as a record of services personally performed by Gery Pray, MD. It was created on his behalf by Maryla Morrow, a trained medical scribe. The creation of this record is based on the scribe's personal observations and the provider's statements to them. This document has been checked and approved by the attending provider.

## 2016-04-25 NOTE — Progress Notes (Signed)
Diane Lozano has completed 10 fractions to her chest.  She denies having pain.  She does report fatigue.  She reports having occasional pain like heartburn in her right central chest.  She denis having a sore throat or feeling like food is stuck in her throat.  She denies having a cough.  The skin on her chest is intact.  BP 140/82 (BP Location: Left Arm, Patient Position: Sitting)   Pulse 72   Temp 98.9 F (37.2 C) (Oral)   Ht 5\' 3"  (1.6 m)   Wt 159 lb 12.8 oz (72.5 kg)   SpO2 100%   BMI 28.31 kg/m    Wt Readings from Last 3 Encounters:  04/25/16 159 lb 12.8 oz (72.5 kg)  04/18/16 167 lb 6.4 oz (75.9 kg)  04/06/16 160 lb 1.6 oz (72.6 kg)

## 2016-04-26 ENCOUNTER — Ambulatory Visit
Admission: RE | Admit: 2016-04-26 | Discharge: 2016-04-26 | Disposition: A | Discharge status: Home / Self Care | Payer: 59 | Source: Ambulatory Visit | Attending: Radiation Oncology | Admitting: Radiation Oncology

## 2016-04-26 DIAGNOSIS — Z5111 Encounter for antineoplastic chemotherapy: Secondary | ICD-10-CM | POA: Diagnosis not present

## 2016-04-26 DIAGNOSIS — Z79899 Other long term (current) drug therapy: Secondary | ICD-10-CM | POA: Diagnosis not present

## 2016-04-26 DIAGNOSIS — E041 Nontoxic single thyroid nodule: Secondary | ICD-10-CM | POA: Diagnosis not present

## 2016-04-26 DIAGNOSIS — C771 Secondary and unspecified malignant neoplasm of intrathoracic lymph nodes: Secondary | ICD-10-CM | POA: Diagnosis not present

## 2016-04-28 ENCOUNTER — Telehealth: Payer: Self-pay

## 2016-04-28 DIAGNOSIS — K209 Esophagitis, unspecified without bleeding: Secondary | ICD-10-CM

## 2016-04-28 MED ORDER — LIDOCAINE VISCOUS HCL 2 % MT SOLN: OROMUCOSAL | 1 refills | Status: DC

## 2016-04-28 MED ORDER — SUCRALFATE 1 G PO TABS: ORAL_TABLET | ORAL | 0 refills | Status: DC

## 2016-04-28 NOTE — Addendum Note (Signed)
Addended by: Baruch Merl on: 04/28/2016 05:28 PM   Modules accepted: Orders

## 2016-04-28 NOTE — Telephone Encounter (Signed)
Pt is getting sore esophagus from XRT, difficulty swallow liquid or solid, worsening. Dr Sondra Come said he could prescribe something but he is not available today. Can Dr Marko Plume order anything? It would be sent to [facility] on [address] please. Call Laniya back please.

## 2016-04-28 NOTE — Telephone Encounter (Signed)
Sent in Carafate and viscous lidocaine for Ms Renae Fickle. She is currently taking in plenty of fluids.  Will let radiation know if she cannot drink fluids adequately.  Pt verbalized understanding.

## 2016-05-01 ENCOUNTER — Ambulatory Visit
Admission: RE | Admit: 2016-05-01 | Discharge: 2016-05-01 | Disposition: A | Discharge status: Home / Self Care | Payer: 59 | Source: Ambulatory Visit | Attending: Radiation Oncology | Admitting: Radiation Oncology

## 2016-05-01 DIAGNOSIS — E041 Nontoxic single thyroid nodule: Secondary | ICD-10-CM | POA: Diagnosis not present

## 2016-05-01 DIAGNOSIS — C771 Secondary and unspecified malignant neoplasm of intrathoracic lymph nodes: Secondary | ICD-10-CM | POA: Diagnosis not present

## 2016-05-01 DIAGNOSIS — Z5111 Encounter for antineoplastic chemotherapy: Secondary | ICD-10-CM | POA: Diagnosis not present

## 2016-05-01 DIAGNOSIS — Z79899 Other long term (current) drug therapy: Secondary | ICD-10-CM | POA: Diagnosis not present

## 2016-05-02 ENCOUNTER — Ambulatory Visit
Admission: RE | Admit: 2016-05-02 | Discharge: 2016-05-02 | Disposition: A | Discharge status: Home / Self Care | Payer: 59 | Source: Ambulatory Visit | Attending: Radiation Oncology | Admitting: Radiation Oncology

## 2016-05-02 ENCOUNTER — Encounter: Payer: Self-pay | Admitting: Radiation Oncology

## 2016-05-02 VITALS — BP 132/69 | HR 85 | Temp 98.1°F | Ht 63.0 in | Wt 156.8 lb

## 2016-05-02 DIAGNOSIS — Z5111 Encounter for antineoplastic chemotherapy: Secondary | ICD-10-CM | POA: Diagnosis not present

## 2016-05-02 DIAGNOSIS — C771 Secondary and unspecified malignant neoplasm of intrathoracic lymph nodes: Secondary | ICD-10-CM | POA: Diagnosis not present

## 2016-05-02 DIAGNOSIS — Z79899 Other long term (current) drug therapy: Secondary | ICD-10-CM | POA: Diagnosis not present

## 2016-05-02 DIAGNOSIS — E041 Nontoxic single thyroid nodule: Secondary | ICD-10-CM | POA: Diagnosis not present

## 2016-05-02 NOTE — Progress Notes (Signed)
Diane Lozano has completed 13 fractions to her chest.  She denies having any pain today.  She reports having trouble with food feeling stuck in her chest on Friday.  She called and Dr. Marko Plume gave her Carafate and lidocaine.  She tried the Carafate and has not had any trouble since.  She denies feeling short of breath or having a cough.  She denies having any fatigue or skin irritation.  She has lost 3 lbs and has been trying to loose weight.  BP 132/69 (BP Location: Left Arm, Patient Position: Sitting)   Pulse 85   Temp 98.1 F (36.7 C) (Oral)   Ht 5\' 3"  (1.6 m)   Wt 156 lb 12.8 oz (71.1 kg)   SpO2 100%   BMI 27.78 kg/m    Wt Readings from Last 3 Encounters:  05/02/16 156 lb 12.8 oz (71.1 kg)  04/25/16 159 lb 12.8 oz (72.5 kg)  04/18/16 167 lb 6.4 oz (75.9 kg)

## 2016-05-02 NOTE — Progress Notes (Signed)
  Radiation Oncology         [phone] ________________________________  Name: Diane Lozano MRN: [id number]  Date: 05/02/2016  DOB: 1956/02/18  Weekly Radiation Therapy Management    ICD-9-CM ICD-10-CM   1. 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 . . . . . . . The patient presents for routine under treatment assessment.                                   The patient has completed 13 fractions to her chest. She denies having any pain today. She reports having trouble with food feeling stuck in her chest on Friday. She called and Dr. Marko Plume prescribed Carafate and lidocaine. She reports not having any trouble since trying the Carafate. She denies shortness of breath or a cough. She denies fatigue. Denies skin irritation. The patient reports she has been trying to lose weight, and has lost 3 lbs.                                  Set-up films were reviewed.                                 The chart was checked. Physical Findings. . .  height is 5\' 3"  (1.6 m) and weight is 156 lb 12.8 oz (71.1 kg). Her oral temperature is 98.1 F (36.7 C). Her blood pressure is 132/69 and her pulse is 85. Her oxygen saturation is 100%. Weight essentially stable.  No significant changes.  Lungs are clear to auscultation bilaterally. Heart has regular rate and rhythm. No palpable cervical, supraclavicular, or axillary adenopathy.   Impression . . . . . . . The patient is tolerating radiation. Plan . . . . . . . . . . . . Continue treatment as planned.  ________________________________   Blair Promise, PhD, MD  This document serves as a record of services personally performed by Gery Pray, MD. It was created on his behalf by Maryla Morrow, a trained medical scribe. The creation of this record is based on the scribe's personal observations and the provider's statements to them. This document has been checked and  approved by the attending provider.

## 2016-05-03 ENCOUNTER — Ambulatory Visit
Admission: RE | Admit: 2016-05-03 | Discharge: 2016-05-03 | Disposition: A | Discharge status: Home / Self Care | Payer: 59 | Source: Ambulatory Visit | Attending: Radiation Oncology | Admitting: Radiation Oncology

## 2016-05-03 DIAGNOSIS — C771 Secondary and unspecified malignant neoplasm of intrathoracic lymph nodes: Secondary | ICD-10-CM | POA: Diagnosis not present

## 2016-05-03 DIAGNOSIS — E041 Nontoxic single thyroid nodule: Secondary | ICD-10-CM | POA: Diagnosis not present

## 2016-05-03 DIAGNOSIS — Z5111 Encounter for antineoplastic chemotherapy: Secondary | ICD-10-CM | POA: Diagnosis not present

## 2016-05-03 DIAGNOSIS — Z79899 Other long term (current) drug therapy: Secondary | ICD-10-CM | POA: Diagnosis not present

## 2016-05-04 ENCOUNTER — Ambulatory Visit
Admission: RE | Admit: 2016-05-04 | Discharge: 2016-05-04 | Disposition: A | Discharge status: Home / Self Care | Payer: 59 | Source: Ambulatory Visit | Attending: Radiation Oncology | Admitting: Radiation Oncology

## 2016-05-04 DIAGNOSIS — C771 Secondary and unspecified malignant neoplasm of intrathoracic lymph nodes: Secondary | ICD-10-CM | POA: Diagnosis not present

## 2016-05-04 DIAGNOSIS — Z5111 Encounter for antineoplastic chemotherapy: Secondary | ICD-10-CM | POA: Diagnosis not present

## 2016-05-04 DIAGNOSIS — Z79899 Other long term (current) drug therapy: Secondary | ICD-10-CM | POA: Diagnosis not present

## 2016-05-04 DIAGNOSIS — E041 Nontoxic single thyroid nodule: Secondary | ICD-10-CM | POA: Diagnosis not present

## 2016-05-05 ENCOUNTER — Ambulatory Visit
Admission: RE | Admit: 2016-05-05 | Discharge: 2016-05-05 | Disposition: A | Discharge status: Home / Self Care | Payer: 59 | Source: Ambulatory Visit | Attending: Radiation Oncology | Admitting: Radiation Oncology

## 2016-05-05 DIAGNOSIS — C771 Secondary and unspecified malignant neoplasm of intrathoracic lymph nodes: Secondary | ICD-10-CM | POA: Diagnosis not present

## 2016-05-05 DIAGNOSIS — Z5111 Encounter for antineoplastic chemotherapy: Secondary | ICD-10-CM | POA: Diagnosis not present

## 2016-05-05 DIAGNOSIS — E041 Nontoxic single thyroid nodule: Secondary | ICD-10-CM | POA: Diagnosis not present

## 2016-05-05 DIAGNOSIS — Z79899 Other long term (current) drug therapy: Secondary | ICD-10-CM | POA: Diagnosis not present

## 2016-05-08 ENCOUNTER — Ambulatory Visit
Admission: RE | Admit: 2016-05-08 | Discharge: 2016-05-08 | Disposition: A | Discharge status: Home / Self Care | Payer: 59 | Source: Ambulatory Visit | Attending: Radiation Oncology | Admitting: Radiation Oncology

## 2016-05-08 DIAGNOSIS — Z79899 Other long term (current) drug therapy: Secondary | ICD-10-CM | POA: Diagnosis not present

## 2016-05-08 DIAGNOSIS — C771 Secondary and unspecified malignant neoplasm of intrathoracic lymph nodes: Secondary | ICD-10-CM | POA: Diagnosis not present

## 2016-05-08 DIAGNOSIS — E041 Nontoxic single thyroid nodule: Secondary | ICD-10-CM | POA: Diagnosis not present

## 2016-05-08 DIAGNOSIS — Z5111 Encounter for antineoplastic chemotherapy: Secondary | ICD-10-CM | POA: Diagnosis not present

## 2016-05-09 ENCOUNTER — Emergency Department (HOSPITAL_BASED_OUTPATIENT_CLINIC_OR_DEPARTMENT_OTHER)
Admit: 2016-05-09 | Discharge: 2016-05-09 | Disposition: A | Discharge status: Home / Self Care | Payer: 59 | Attending: Emergency Medicine | Admitting: Emergency Medicine

## 2016-05-09 ENCOUNTER — Emergency Department (HOSPITAL_COMMUNITY)
Admission: EM | Admit: 2016-05-09 | Discharge: 2016-05-09 | Disposition: A | Discharge status: Home / Self Care | Payer: 59 | Attending: Emergency Medicine | Admitting: Emergency Medicine

## 2016-05-09 ENCOUNTER — Ambulatory Visit
Admission: RE | Admit: 2016-05-09 | Discharge: 2016-05-09 | Disposition: A | Discharge status: Home / Self Care | Payer: 59 | Source: Ambulatory Visit | Attending: Radiation Oncology | Admitting: Radiation Oncology

## 2016-05-09 ENCOUNTER — Telehealth: Payer: Self-pay | Admitting: *Deleted

## 2016-05-09 ENCOUNTER — Encounter (HOSPITAL_COMMUNITY): Payer: Self-pay | Admitting: Emergency Medicine

## 2016-05-09 ENCOUNTER — Encounter: Payer: Self-pay | Admitting: Radiation Oncology

## 2016-05-09 VITALS — BP 124/68 | HR 75 | Temp 99.4°F | Resp 20 | Wt 158.6 lb

## 2016-05-09 DIAGNOSIS — I10 Essential (primary) hypertension: Secondary | ICD-10-CM | POA: Diagnosis not present

## 2016-05-09 DIAGNOSIS — M79605 Pain in left leg: Secondary | ICD-10-CM | POA: Diagnosis not present

## 2016-05-09 DIAGNOSIS — Z5111 Encounter for antineoplastic chemotherapy: Secondary | ICD-10-CM | POA: Diagnosis not present

## 2016-05-09 DIAGNOSIS — C541 Malignant neoplasm of endometrium: Secondary | ICD-10-CM | POA: Diagnosis not present

## 2016-05-09 DIAGNOSIS — Z87891 Personal history of nicotine dependence: Secondary | ICD-10-CM | POA: Diagnosis not present

## 2016-05-09 DIAGNOSIS — M79609 Pain in unspecified limb: Secondary | ICD-10-CM | POA: Diagnosis not present

## 2016-05-09 DIAGNOSIS — C771 Secondary and unspecified malignant neoplasm of intrathoracic lymph nodes: Secondary | ICD-10-CM

## 2016-05-09 DIAGNOSIS — Z79899 Other long term (current) drug therapy: Secondary | ICD-10-CM | POA: Diagnosis not present

## 2016-05-09 DIAGNOSIS — E041 Nontoxic single thyroid nodule: Secondary | ICD-10-CM | POA: Diagnosis not present

## 2016-05-09 NOTE — ED Notes (Signed)
PT DISCHARGED. INSTRUCTIONS GIVEN. AAOX4. PT IN NO APPARENT DISTRESS OR PAIN. THE OPPORTUNITY TO ASK QUESTIONS WAS PROVIDED. 

## 2016-05-09 NOTE — Progress Notes (Signed)
VASCULAR LAB PRELIMINARY  PRELIMINARY  PRELIMINARY  PRELIMINARY  Left lower extremity venous duplex completed.    Preliminary report:  Left - No evidence of DVT . There is no evidence of superficial thrombosis of the greater or lesser saphenous  Veins. No evidence to a Baker's cyst. Positive for a thrombosed varicosities of the posterior calf.  Dinnis Rog, [place], RVS 05/09/2016, 6:57 PM

## 2016-05-09 NOTE — Telephone Encounter (Addendum)
"  I'm receiving radiation treatments along with Tamoxifen and Megace.  Past few days I'm experiencing symptoms of what I believe to be a blood clot to my left leg.  I went to my regular doctor and left after waiting two hours.  I was told to return at 5:00 pm.  Any advice."    Advised she return to urgent care but now see PCP with [facility].  [facility] begins at 5:00 after office hours.  "Let Dr. Marko Plume know, I'll go if she advises."    Admits to "left leg redness, warmth, soreness with left leg at outer calf appears larger than right started today.  I have been waking on it the past few days."  Advised she go to Urgent Care off [facility], Vascular Team leaves at 5:00 pm.  Tomorrow's RT begins at 8:00 am and can come to [facility] at 9:00 for evaluation.  Will notify Dr. Marko Plume.  Scheduling message sent and patient's return number [phone].  Instructed to keep leg elevated with no excessive walking and no warm compresses, no massaging of leg.

## 2016-05-09 NOTE — Discharge Instructions (Signed)
Warm compresses Tylenol/Ibuprofen for pain Follow up with PCP

## 2016-05-09 NOTE — ED Provider Notes (Signed)
[name] Provider Note   CSN: [id number] Arrival date & time: 05/09/16  1614     History   Chief Complaint Chief Complaint  Patient presents with  . Leg Pain    right    HPI Diane Lozano is a 60 y.o. female who presents with left calf pain. Past medical history significant for endometrial cancer currently on radiation therapy. Patient states for the past several days she has noticed a soreness in her left calf. She started to have mild redness in the left calf today. Denies significant swelling. She brought this to the attention of her primary care doctor who sent her to the emergency room to have DVT ruled out. No prior history of DVT or PE. No chest pain or shortness of breath. Denies injury. She is a [employer] nurse.  HPI  Past Medical History:  Diagnosis Date  . Endometrial ca ([place])    iA high grade papillary serous endometrial carcinoma  . Hx of radiation therapy 5/27, 6/5, 6/11, 6/19, 11/27/2012   proximal vagina 30 Gy in 5 fx, HDR  . Hyperlipidemia   . Hypertension   . Radiation 04/05/15 - 05/13/15   retroperitoneal nodes 50.4 gray boosted to 56 gray    Patient Active Problem List   Diagnosis Date Noted  . High risk medication use 10/31/2015  . Mediastinal adenopathy 10/05/2015  . Anxiety about health 10/05/2015  . Right thyroid nodule 10/05/2015  . 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 Thyroid goiter 03/13/2015  . Chemotherapy induced neutropenia ([place]) 01/29/2015  . Hypoglycemia 01/16/2015  . Chemotherapy induced thrombocytopenia 12/26/2014  . Leukopenia due to antineoplastic chemotherapy ([place]) 12/05/2014  . Chemotherapy induced nausea and vomiting 12/05/2014  . Metastatic cancer to pelvis ([place]) 11/20/2014  . Metastatic cancer to intra-abdominal lymph nodes ([place]) 11/20/2014  . Abnormal thyroid scan 11/20/2014  . Endometrial ca ([place])   . Hx of radiation therapy   . Endometrial cancer ([place]) 08/26/2012  . HTN (hypertension)  08/26/2012    Past Surgical History:  Procedure Laterality Date  . ABDOMINAL HYSTERECTOMY     robotic hysterectomy with BSO and bilateral pelvic and periaortic node dissection  . CESAREAN SECTION    . CESAREAN SECTION    . COLONOSCOPY  2008    OB History    No data available       Home Medications    Prior to Admission medications   Medication Sig Start Date End Date Taking? Authorizing Provider  Calcium Carb-Cholecalciferol 500-600 MG-UNIT TABS Take 1 tablet by mouth every morning.    Yes [name], MD  Cholecalciferol (VITAMIN D3) 2000 units TABS Take 2,000 Units by mouth every other day.    Yes Historical Provider, MD  megestrol (MEGACE) 40 MG tablet Take 2 tablets (80 mg total) by mouth 2 (two) times daily. X 3 weeks alternating with tamoxifen 12/16/15  Yes Lennis Marion Downer, MD  simvastatin (ZOCOR) 40 MG tablet Take 40 mg by mouth every evening.   Yes [name], MD  sucralfate (CARAFATE) 1 g tablet Dissolve 1 tablet in water and use 4 times a day before meals and at bedtime for esophagitis Patient taking differently: Take 2 g by mouth 4 (four) times daily as needed (stomach pain). Dissolve 1 tablet in water and use 4 times a day before meals and at bedtime for esophagitis 04/28/16  Yes Lennis Marion Downer, MD  tamoxifen (NOLVADEX) 20 MG tablet Take 1 tablet (20 mg total) by mouth 2 (two)  times daily. X 3 weeks alternating with Megace. 03/03/16  Yes Lennis Marion Downer, MD  venlafaxine XR (EFFEXOR-XR) 75 MG 24 hr capsule Take 75 mg by mouth daily with breakfast.    Yes [name], MD  ALPRAZolam Duanne Moron) 0.25 MG tablet Take 1 tablet (0.25 mg total) by mouth every 8 (eight) hours as needed (for severe anxiety). Patient not taking: Reported on 05/09/2016 10/04/15   [name], MD  Lidocaine HCl 2 % SOLN 5 cc swallow as needed for esophagitis Patient not taking: Reported on 05/09/2016 04/28/16   [name], MD    Family History Family History  Problem Relation Age of Onset    . Dementia Mother   . Thyroid disease Mother   . Heart disease Father   . Renal Disease Father   . Cancer Brother     melanoma involving eye  . Polycystic ovary syndrome Daughter     Social History Social History  Substance Use Topics  . Smoking status: Former Smoker    Quit date: 06/05/1982  . Smokeless tobacco: Never Used     Comment: approximate quit date  . Alcohol use Yes     Comment: occasional     Allergies   Patient has no known allergies.   Review of Systems Review of Systems  Respiratory: Negative for shortness of breath.   Cardiovascular: Negative for chest pain.  Musculoskeletal: Positive for myalgias.  Skin: Negative for wound.  All other systems reviewed and are negative.    Physical Exam Updated Vital Signs BP 166/87 (BP Location: Left Arm)   Pulse 83   Temp 97.9 F (36.6 C) (Oral)   Resp 18   SpO2 100%   Physical Exam  Constitutional: She is oriented to person, place, and time. She appears well-developed and well-nourished. No distress.  Pleasant, NAD  HENT:  Head: Normocephalic and atraumatic.  Eyes: Conjunctivae are normal. Pupils are equal, round, and reactive to light. Right eye exhibits no discharge. Left eye exhibits no discharge. No scleral icterus.  Neck: Normal range of motion.  Cardiovascular: Normal rate and regular rhythm.  Exam reveals no gallop and no friction rub.   No murmur heard. Pulmonary/Chest: Effort normal and breath sounds normal. No respiratory distress. She has no wheezes. She has no rales. She exhibits no tenderness.  Abdominal: She exhibits no distension.  Musculoskeletal:  Left lower leg: Mild redness extending from distal calf to mid calf. No significant swelling appreciated. Mild tenderness to palpation. -[place]  Neurological: She is alert and oriented to person, place, and time.  Skin: Skin is warm and dry.  Psychiatric: She has a normal mood and affect. Her behavior is normal.  Nursing note and vitals  reviewed.    ED Treatments / Results  Labs (all labs ordered are listed, but only abnormal results are displayed) Labs Reviewed - No data to display  EKG  EKG Interpretation None       Radiology No results found.  Procedures Procedures (including critical care time)  Medications Ordered in ED Medications - No data to display   Initial Impression / Assessment and Plan / ED Course  I have reviewed the triage vital signs and the nursing notes.  Pertinent labs & imaging results that were available during my care of the patient were reviewed by me and considered in my medical decision making (see chart for details).  Clinical Course    60 year old female presents for evaluation and [place] to have DVT r/o. She  is mildly hypertensive but otherwise vitals are normal. US shows thrombosed varicosities in the posterior calf but no DVT. Patient is NAD, non-toxic, with normal VS. Patient is informed of clinical course, understands medical decision making process, and agrees with plan. Opportunity for questions provided and all questions answered. Return precautions given.   Final Clinical Impressions(s) / ED Diagnoses   Final diagnoses:  Left leg pain    New Prescriptions New Prescriptions   No medications on file     Recardo Evangelist, PA-C 05/14/16 [id number]    Davonna Belling, MD 05/17/16 [phone]

## 2016-05-09 NOTE — Progress Notes (Addendum)
Weekly rad txs chest 18/28 complet, no skin changes, not using sonafne , no coughing, no nausea, no difficulty swallowing foods or fluids, mild fatigue stated, not using crafate either No pain BP 124/68 (BP Location: Left Arm, Patient Position: Sitting, Cuff Size: Normal)   Pulse 75   Temp 99.4 F (37.4 C) (Oral)   Resp 20   Wt 158 lb 9.6 oz (71.9 kg)   SpO2 100% Comment: room air  BMI 28.09 kg/m   Wt Readings from Last 3 Encounters:  05/09/16 158 lb 9.6 oz (71.9 kg)  05/02/16 156 lb 12.8 oz (71.1 kg)  04/25/16 159 lb 12.8 oz (72.5 kg)

## 2016-05-09 NOTE — ED Triage Notes (Signed)
Pt reports right leg, sent by PCP to rule out DVT. HX endometrial cancer , last radiation this morning. Denies shortness of breath nor chest pain.

## 2016-05-09 NOTE — Telephone Encounter (Signed)
Verbal order received and read back from Dr. Marko Plume for patient to have doppler this evening with results paged to her.  If no availability patient to go to the ED for further evaluation.  Called [facility] vascular.  No availability this afternoon.    Called Ms. Crabtree with instructions to go to the ED for evaluation tonight not tomorrow for further evaluation. Cancelled [facility] request.

## 2016-05-09 NOTE — Progress Notes (Signed)
  Radiation Oncology         [phone] ________________________________  Name: Diane Lozano MRN: [id number]  Date: 05/09/2016  DOB: Sep 16, 1955  Weekly Radiation Therapy Management    ICD-9-CM ICD-10-CM   1. 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 . . . . . . . The patient presents for routine under treatment assessment.                                   The patient has completed 18 of 28 planned treatments. The patient denies cough, nausea, or difficulty swallowing foods or fluids. The patient reports mild fatigue. She denies pain. The patient is not currently using sonafine cream or carafate.                                 Set-up films were reviewed.                                 The chart was checked. Physical Findings. . .  weight is 158 lb 9.6 oz (71.9 kg). Her oral temperature is 99.4 F (37.4 C). Her blood pressure is 124/68 and her pulse is 75. Her respiration is 20 and oxygen saturation is 100%. Weight essentially stable.  No significant changes.  Lungs are clear to auscultation bilaterally. Heart has regular rate and rhythm. No palpable cervical, supraclavicular, or axillary adenopathy. Impression . . . . . . . The patient is tolerating radiation. Plan . . . . . . . . . . . . Continue treatment as planned.  ________________________________   Blair Promise, PhD, MD  This document serves as a record of services personally performed by Gery Pray, MD. It was created on his behalf by Maryla Morrow, a trained medical scribe. The creation of this record is based on the scribe's personal observations and the provider's statements to them. This document has been checked and approved by the attending provider.

## 2016-05-10 ENCOUNTER — Encounter: Payer: 59 | Admitting: Nurse Practitioner

## 2016-05-10 ENCOUNTER — Ambulatory Visit
Admission: RE | Admit: 2016-05-10 | Discharge: 2016-05-10 | Disposition: A | Discharge status: Home / Self Care | Payer: 59 | Source: Ambulatory Visit | Attending: Radiation Oncology | Admitting: Radiation Oncology

## 2016-05-10 DIAGNOSIS — C771 Secondary and unspecified malignant neoplasm of intrathoracic lymph nodes: Secondary | ICD-10-CM | POA: Diagnosis not present

## 2016-05-10 DIAGNOSIS — E041 Nontoxic single thyroid nodule: Secondary | ICD-10-CM | POA: Diagnosis not present

## 2016-05-10 DIAGNOSIS — Z5111 Encounter for antineoplastic chemotherapy: Secondary | ICD-10-CM | POA: Diagnosis not present

## 2016-05-10 DIAGNOSIS — Z79899 Other long term (current) drug therapy: Secondary | ICD-10-CM | POA: Diagnosis not present

## 2016-05-11 ENCOUNTER — Ambulatory Visit
Admission: RE | Admit: 2016-05-11 | Discharge: 2016-05-11 | Disposition: A | Discharge status: Home / Self Care | Payer: 59 | Source: Ambulatory Visit | Attending: Radiation Oncology | Admitting: Radiation Oncology

## 2016-05-11 DIAGNOSIS — C771 Secondary and unspecified malignant neoplasm of intrathoracic lymph nodes: Secondary | ICD-10-CM | POA: Diagnosis not present

## 2016-05-11 DIAGNOSIS — E041 Nontoxic single thyroid nodule: Secondary | ICD-10-CM | POA: Diagnosis not present

## 2016-05-11 DIAGNOSIS — Z5111 Encounter for antineoplastic chemotherapy: Secondary | ICD-10-CM | POA: Diagnosis not present

## 2016-05-11 DIAGNOSIS — Z79899 Other long term (current) drug therapy: Secondary | ICD-10-CM | POA: Diagnosis not present

## 2016-05-12 ENCOUNTER — Ambulatory Visit
Admission: RE | Admit: 2016-05-12 | Discharge: 2016-05-12 | Disposition: A | Discharge status: Home / Self Care | Payer: 59 | Source: Ambulatory Visit | Attending: Radiation Oncology | Admitting: Radiation Oncology

## 2016-05-12 DIAGNOSIS — Z79899 Other long term (current) drug therapy: Secondary | ICD-10-CM | POA: Diagnosis not present

## 2016-05-12 DIAGNOSIS — E041 Nontoxic single thyroid nodule: Secondary | ICD-10-CM | POA: Diagnosis not present

## 2016-05-12 DIAGNOSIS — C771 Secondary and unspecified malignant neoplasm of intrathoracic lymph nodes: Secondary | ICD-10-CM | POA: Diagnosis not present

## 2016-05-12 DIAGNOSIS — Z5111 Encounter for antineoplastic chemotherapy: Secondary | ICD-10-CM | POA: Diagnosis not present

## 2016-05-15 ENCOUNTER — Ambulatory Visit
Admission: RE | Admit: 2016-05-15 | Discharge: 2016-05-15 | Disposition: A | Discharge status: Home / Self Care | Payer: 59 | Source: Ambulatory Visit | Attending: Radiation Oncology | Admitting: Radiation Oncology

## 2016-05-15 DIAGNOSIS — E041 Nontoxic single thyroid nodule: Secondary | ICD-10-CM | POA: Diagnosis not present

## 2016-05-15 DIAGNOSIS — Z5111 Encounter for antineoplastic chemotherapy: Secondary | ICD-10-CM | POA: Diagnosis not present

## 2016-05-15 DIAGNOSIS — C771 Secondary and unspecified malignant neoplasm of intrathoracic lymph nodes: Secondary | ICD-10-CM | POA: Diagnosis not present

## 2016-05-15 DIAGNOSIS — Z79899 Other long term (current) drug therapy: Secondary | ICD-10-CM | POA: Diagnosis not present

## 2016-05-16 ENCOUNTER — Ambulatory Visit
Admission: RE | Admit: 2016-05-16 | Discharge: 2016-05-16 | Disposition: A | Discharge status: Home / Self Care | Payer: 59 | Source: Ambulatory Visit | Attending: Radiation Oncology | Admitting: Radiation Oncology

## 2016-05-16 ENCOUNTER — Other Ambulatory Visit: Payer: Self-pay | Admitting: Oncology

## 2016-05-16 ENCOUNTER — Ambulatory Visit: Payer: 59 | Admitting: Radiation Oncology

## 2016-05-16 DIAGNOSIS — E041 Nontoxic single thyroid nodule: Secondary | ICD-10-CM | POA: Diagnosis not present

## 2016-05-16 DIAGNOSIS — C771 Secondary and unspecified malignant neoplasm of intrathoracic lymph nodes: Secondary | ICD-10-CM | POA: Diagnosis not present

## 2016-05-16 DIAGNOSIS — Z5111 Encounter for antineoplastic chemotherapy: Secondary | ICD-10-CM | POA: Diagnosis not present

## 2016-05-16 DIAGNOSIS — Z79899 Other long term (current) drug therapy: Secondary | ICD-10-CM | POA: Diagnosis not present

## 2016-05-17 ENCOUNTER — Other Ambulatory Visit (HOSPITAL_BASED_OUTPATIENT_CLINIC_OR_DEPARTMENT_OTHER): Payer: 59

## 2016-05-17 ENCOUNTER — Encounter: Payer: Self-pay | Admitting: Radiation Oncology

## 2016-05-17 ENCOUNTER — Ambulatory Visit
Admission: RE | Admit: 2016-05-17 | Discharge: 2016-05-17 | Disposition: A | Discharge status: Home / Self Care | Payer: 59 | Source: Ambulatory Visit | Attending: Radiation Oncology | Admitting: Radiation Oncology

## 2016-05-17 VITALS — BP 136/89 | HR 79 | Temp 98.0°F | Ht 63.0 in | Wt 157.6 lb

## 2016-05-17 DIAGNOSIS — C541 Malignant neoplasm of endometrium: Secondary | ICD-10-CM

## 2016-05-17 DIAGNOSIS — E041 Nontoxic single thyroid nodule: Secondary | ICD-10-CM | POA: Diagnosis not present

## 2016-05-17 DIAGNOSIS — C771 Secondary and unspecified malignant neoplasm of intrathoracic lymph nodes: Secondary | ICD-10-CM | POA: Diagnosis not present

## 2016-05-17 DIAGNOSIS — Z79899 Other long term (current) drug therapy: Secondary | ICD-10-CM | POA: Diagnosis not present

## 2016-05-17 DIAGNOSIS — Z5111 Encounter for antineoplastic chemotherapy: Secondary | ICD-10-CM | POA: Diagnosis not present

## 2016-05-17 LAB — COMPREHENSIVE METABOLIC PANEL
ALT: 18 U/L (ref 0–55)
AST: 22 U/L (ref 5–34)
Albumin: 3.5 g/dL (ref 3.5–5.0)
Alkaline Phosphatase: 38 U/L — ABNORMAL LOW (ref 40–150)
Anion Gap: 8 mEq/L (ref 3–11)
BUN: 15.2 mg/dL (ref 7.0–26.0)
CO2: 24 mEq/L (ref 22–29)
Calcium: 9.1 mg/dL (ref 8.4–10.4)
Chloride: 111 mEq/L — ABNORMAL HIGH (ref 98–109)
Creatinine: 0.8 mg/dL (ref 0.6–1.1)
EGFR: 80 mL/min/{1.73_m2} — ABNORMAL LOW (ref >90)
Glucose: 82 mg/dl (ref 70–140)
Potassium: 3.8 mEq/L (ref 3.5–5.1)
Sodium: 142 mEq/L (ref 136–145)
Total Bilirubin: 0.47 mg/dL (ref 0.20–1.20)
Total Protein: 6.7 g/dL (ref 6.4–8.3)

## 2016-05-17 LAB — CBC WITH DIFFERENTIAL/PLATELET
BASO%: 2.5 % — ABNORMAL HIGH (ref 0.0–2.0)
Basophils Absolute: 0.1 10*3/uL (ref 0.0–0.1)
EOS%: 4.9 % (ref 0.0–7.0)
Eosinophils Absolute: 0.1 10*3/uL (ref 0.0–0.5)
HCT: 34.5 % — ABNORMAL LOW (ref 34.8–46.6)
HGB: 11.6 g/dL (ref 11.6–15.9)
LYMPH%: 13.7 % — ABNORMAL LOW (ref 14.0–49.7)
MCH: 32.9 pg (ref 25.1–34.0)
MCHC: 33.6 g/dL (ref 31.5–36.0)
MCV: 97.9 fL (ref 79.5–101.0)
MONO#: 0.2 10*3/uL (ref 0.1–0.9)
MONO%: 8.2 % (ref 0.0–14.0)
NEUT#: 1.7 10*3/uL (ref 1.5–6.5)
NEUT%: 70.7 % (ref 38.4–76.8)
Platelets: 152 10*3/uL (ref 145–400)
RBC: 3.52 10*6/uL — ABNORMAL LOW (ref 3.70–5.45)
RDW: 12.9 % (ref 11.2–14.5)
WBC: 2.5 10*3/uL — ABNORMAL LOW (ref 3.9–10.3)
lymph#: 0.3 10*3/uL — ABNORMAL LOW (ref 0.9–3.3)

## 2016-05-17 NOTE — Progress Notes (Signed)
Diane Lozano has completed 24 fractions to her chest.  She denies having pain.  She reports feeling like food is stuck in her chest occasionally.  She is taking Carafate as needed and has not had to take lidocaine yet.  She denies having any nausea.  She reports having fatigue.  The skin on her chest is intact.  BP 136/89 (BP Location: Left Arm, Patient Position: Sitting)   Pulse 79   Temp 98 F (36.7 C) (Oral)   Ht 5\' 3"  (1.6 m)   Wt 157 lb 9.6 oz (71.5 kg)   SpO2 99%   BMI 27.92 kg/m    Wt Readings from Last 3 Encounters:  05/17/16 157 lb 9.6 oz (71.5 kg)  05/09/16 157 lb (71.2 kg)  05/09/16 158 lb 9.6 oz (71.9 kg)

## 2016-05-17 NOTE — Progress Notes (Signed)
  Radiation Oncology         [phone] ________________________________  Name: Diane Lozano MRN: [id number]  Date: 05/17/2016  DOB: 08/30/1955  Weekly Radiation Therapy Management    ICD-9-CM ICD-10-CM   1. 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 . . . . . . . The patient presents for routine under treatment assessment.                                   The patient has completed 18 of 28 planned treatments. The patient reports feeling "like food is stuck in her chest" occasionally. She is taking Carafate prn and has not taken Lidocaine. She denies pain, or nausea. She reports fatigue.                                 Set-up films were reviewed.                                 The chart was checked. Physical Findings. . .  height is 5\' 3"  (1.6 m) and weight is 157 lb 9.6 oz (71.5 kg). Her oral temperature is 98 F (36.7 C). Her blood pressure is 136/89 and her pulse is 79. Her oxygen saturation is 99%. Weight essentially stable.  No significant changes.  Lungs are clear to auscultation bilaterally. Heart has regular rate and rhythm. No palpable cervical, supraclavicular, or axillary adenopathy. Impression . . . . . . . The patient is tolerating radiation With minimal symptomatology. Plan . . . . . . . . . . . . Continue treatment as planned.  ________________________________   Blair Promise, PhD, MD  This document serves as a record of services personally performed by Gery Pray, MD. It was created on his behalf by Bethann Humble, a trained medical scribe. The creation of this record is based on the scribe's personal observations and the provider's statements to them. This document has been checked and approved by the attending provider.

## 2016-05-18 ENCOUNTER — Ambulatory Visit (HOSPITAL_BASED_OUTPATIENT_CLINIC_OR_DEPARTMENT_OTHER): Payer: 59 | Admitting: Oncology

## 2016-05-18 ENCOUNTER — Ambulatory Visit
Admission: RE | Admit: 2016-05-18 | Discharge: 2016-05-18 | Disposition: A | Discharge status: Home / Self Care | Payer: 59 | Source: Ambulatory Visit | Attending: Radiation Oncology | Admitting: Radiation Oncology

## 2016-05-18 ENCOUNTER — Other Ambulatory Visit: Payer: Self-pay | Admitting: Oncology

## 2016-05-18 ENCOUNTER — Other Ambulatory Visit: Payer: 59

## 2016-05-18 ENCOUNTER — Ambulatory Visit: Payer: 59 | Admitting: Oncology

## 2016-05-18 VITALS — BP 151/91 | HR 80 | Temp 98.2°F | Resp 18 | Ht 63.0 in | Wt 160.1 lb

## 2016-05-18 DIAGNOSIS — K208 Other esophagitis without bleeding: Secondary | ICD-10-CM

## 2016-05-18 DIAGNOSIS — Z79899 Other long term (current) drug therapy: Secondary | ICD-10-CM | POA: Diagnosis not present

## 2016-05-18 DIAGNOSIS — C541 Malignant neoplasm of endometrium: Secondary | ICD-10-CM | POA: Diagnosis not present

## 2016-05-18 DIAGNOSIS — C772 Secondary and unspecified malignant neoplasm of intra-abdominal lymph nodes: Secondary | ICD-10-CM

## 2016-05-18 DIAGNOSIS — I8002 Phlebitis and thrombophlebitis of superficial vessels of left lower extremity: Secondary | ICD-10-CM

## 2016-05-18 DIAGNOSIS — C771 Secondary and unspecified malignant neoplasm of intrathoracic lymph nodes: Secondary | ICD-10-CM | POA: Diagnosis not present

## 2016-05-18 DIAGNOSIS — E041 Nontoxic single thyroid nodule: Secondary | ICD-10-CM | POA: Diagnosis not present

## 2016-05-18 DIAGNOSIS — R590 Localized enlarged lymph nodes: Secondary | ICD-10-CM

## 2016-05-18 DIAGNOSIS — T66XXXA Radiation sickness, unspecified, initial encounter: Secondary | ICD-10-CM

## 2016-05-18 DIAGNOSIS — Z5111 Encounter for antineoplastic chemotherapy: Secondary | ICD-10-CM | POA: Diagnosis not present

## 2016-05-18 LAB — CA 125: Cancer Antigen (CA) 125: 9 U/mL (ref 0.0–38.1)

## 2016-05-18 NOTE — Progress Notes (Signed)
OFFICE PROGRESS NOTE   May 18, 2016   Physicians: Yevonne Aline Tobie Lords Willis, Painter (gyn onc [facility])  INTERVAL HISTORY:  Patient is seen, alone for visit, in continuing attention to recurrent endometrial carcinoma, for which she continues alternating tamoxifen/ megace since May 2017 and will complete radiation to right paratracheal node on 05-23-16. She saw Dr Genia Del [phone], with next visit to be after radiation completes and possibly after another PET.  She has superficial thromboses in varicose veins left calf, evaluated with venous doppler 05-09-16 that showed no DVT.  She has no history of superficial or deep venous clots previously, does sit for extended times with computer at work, and is on the tamoxifen / megace. She has used warm soaks to the areas, but has not used NSAID as recommended in ED, and is not on ASA. Areas distally on left calf are better, however today she has noticed similar erythema and tenderness in upper calf also at area of varicosities.   Patient is tolerating radiation well, tho notices slight discomfort in mid chest on swallowing, improves with carafate. She has no SOB, cough, other chest pain, or fatigue. She denies abdominal or pelvic symptoms, bowels fine. She has had no recent infectious illness.  Remainder of 10 point Review of Systems negative   No central catheter.  No genetics testing CA 125 132 on 11-27-14. CA 125 baseline for tamoxifen/ megace 37.8 on 09-09-15 [facility] testing done 11-2015 on surgical path from 2014, scanned into this EMR: MS stable TMB low 4 muts/mb ATM [id number] ERBB3 T389K E2H2 rearrangement exon 9 PPP2R1A P179R TP53 I195N   ONCOLOGIC HISTORY Patient presented to PCP with intermittent vaginal bleeding since ~ Oct 2013, endometrial biopsy 06-20-12 with complex endometrial hyperplasia with atypia. Pelvic and transvaginal US in [facility] system on 06-26-12  showed endometrial thickening of 2.7 cm, right ovary not clearly seen and left ovary with normal postmenopausal appearance. She was seen by Dr Christophe Louis and referred to Dr Polly Cobia. She had robotic hysterectomy with BSO and bilateral pelvic and periaortic node dissection by Dr Polly Cobia at [facility] on 07-30-2012. Intraoperatively she had large endometrial polyp with high grade malignancy, no obvious extrauterine disease. Pathology ([facility] [place] [id number] from 07-30-2012) had high grade, poorly differentiated endometrial adenocarcinoma with solid and serous papillary components, 2 periaortic nodes negative and 9 right pelvic/11 left pelvic nodes negative. The tumor was 4.8 cm confined to polyp with no definite myometrial involvement, margins clear and no angiolymphatic invasion identified.  Cycle 1 taxol carboplatin was given 08-30-12, 6 cycles of q 3 week regimen given thru 12-13-2012, with gCSF support. Radiation was intracavitary HDR to proximal vagina 30 Gy in 5 fractions from May 27 thru November 27, 2012, by Dr Sondra Come. Patient was followed closely by Dr Polly Cobia after completing adjuvant taxol carboplatin chemotherapy in 12-2012. Patient felt very well, with intentional 70 lb weight loss over a year from Jan thru Dec 2015 on [employer]. She saw Dr Polly Cobia with unremarkable exam in March 2016, however CA 125 was slightly elevated at 36.3, up from previous 29.2. She had CT CAP with contrast 10-16-14 with new left periaortic and left common iliac lymphadenopathy. PET 7-61-60 had hypermetabolic uptake in retroperitoneal nodes from lower poles of kidneys to aortic bifurcation,largest 2.0 x 2.1 cm, as well as uptake in either right supraclavicular node vs right lobe of thyroid (report below). FNA of thyroid 10-28-14 with benign tissue, [place] at time of  biopsy reportedly showing no right supraclavicular adenopathy. She had CT needle biopsy of left para-aortic node 11-16-14 with  path showing serous carcinoma. She began chemotherapy with dose dense carbo taxol on 11-27-14, with carbo at AUC = 5. Over next 3 weeks WBC and platelets were too low for treatments, despite total 7 doses of granix. She had taxol only cycle 2, then [name] at AUC=2 + taxol cycle 3. Counts maintained with increase in gCSF to 480 mcg 3-4 doses after each treatment, including increase in [name] for third dose to AUC =3 on 02-11-15. PET 03-01-15 had uptake in one left peri-aortic node and the area of previous benign biopsy right lobe thyroid. She received IMRT, summary as follows:  Diagnosis: Recurrent Endometrial cancer (high-grade serous adenocarcinoma) with metastasis to the retroperitoneal lymph nodes  Indication for treatment: Isolated recurrence in this region, definitive treatment  Radiation treatment dates: October 31 through December 8 Site/dose: PET positive retroperitoneal nodes 50.4 gray in 28 fractions with simultaneous integrated boost to 56 gray CT AP 06-17-15 showed no apparent residual or progressive disease. PET 08-31-15, compared with 03-01-15 and CT 06-17-15, showed no findings of concern for persistent or progressive disease in abdomen or pelvis. There is same hypermetabolic right thyroid nodule (2.8 cm with SUV 10.4 compared with 2.7 cm with SUV 10 on prior) and new right paratracheal adenopathy with node 2.3 cm and SUV 17.7.  Surgical path from 07-30-12 tested for ER PR on 09-06-15, with ER + intermediate in 25-35% and PR + strong in 25-35%.  Biopsy of thyroid nodule benign 09-20-15.  She began tamoxifen/ megace 09-2015 (break during first 3 weeks of tamoxifen for trip overseas).  PET 03-22-16 had increase in the same hypermetabolic paratracheal node and stable right thyroid uptake, no other areas of concern. She began radiation to paratracheal node ~ 04-13-16.    Objective:  Vital signs in last 24 hours:  BP (!) 151/91 (BP Location: Right Arm, Patient Position: Sitting)    Pulse 80   Temp 98.2 F (36.8 C) (Oral)   Resp 18   Ht _0  (1.6 m)   Wt 160 lb 1.6 oz (72.6 kg)   SpO2 99%   BMI 28.36 kg/m   Alert, oriented and appropriate. Ambulatory without difficulty.  No alopecia  HEENT:PERRL, sclerae not icteric. Oral mucosa moist without lesions, posterior pharynx clear.  Neck supple. No JVD.  Lymphatics:no supraclavicular adenopathy Resp: clear to auscultation bilaterally and normal percussion bilaterally Cardio: regular rate and rhythm. No gallop. GI: abdomen soft, nontender, not distended, no mass or organomegaly. Normally active bowel sounds. Musculoskeletal/ Extremities: LLE with area of slight erythema and tender palpable varicose veins ~ 4 x 6 cm upper lateral left calf. No swelling otherwise, no cords otherwise. Superficial varicose veins also left thigh, not inflamed. RLE without pitting edema, cords, tenderness Neuro: no peripheral neuropathy. Otherwise nonfocal. PSYCH appropriate mood and affect Skin without rash, ecchymosis, petechiae   Lab Results:  Results for orders placed or performed in visit on 05/17/16  CBC with Differential  Result Value Ref Range   WBC 2.5 (L) 3.9 - 10.3 10e3/uL   NEUT# 1.7 1.5 - 6.5 10e3/uL   HGB 11.6 11.6 - 15.9 g/dL   HCT 34.5 (L) 34.8 - 46.6 %   Platelets 152 145 - 400 10e3/uL   MCV 97.9 79.5 - 101.0 fL   MCH 32.9 25.1 - 34.0 pg   MCHC 33.6 31.5 - 36.0 g/dL   RBC 3.52 (L) 3.70 - 5.45 10e6/uL  RDW 12.9 11.2 - 14.5 %   lymph# 0.3 (L) 0.9 - 3.3 10e3/uL   MONO# 0.2 0.1 - 0.9 10e3/uL   Eosinophils Absolute 0.1 0.0 - 0.5 10e3/uL   Basophils Absolute 0.1 0.0 - 0.1 10e3/uL   NEUT% 70.7 38.4 - 76.8 %   LYMPH% 13.7 (L) 14.0 - 49.7 %   MONO% 8.2 0.0 - 14.0 %   EOS% 4.9 0.0 - 7.0 %   BASO% 2.5 (H) 0.0 - 2.0 %  Comprehensive metabolic panel  Result Value Ref Range   Sodium 142 136 - 145 mEq/L   Potassium 3.8 3.5 - 5.1 mEq/L   Chloride 111 (H) 98 - 109 mEq/L   CO2 24 22 - 29 mEq/L   Glucose 82 70 - 140  mg/dl   BUN 15.2 7.0 - 26.0 mg/dL   Creatinine 0.8 0.6 - 1.1 mg/dL   Total Bilirubin 0.47 0.20 - 1.20 mg/dL   Alkaline Phosphatase 38 (L) 40 - 150 U/L   AST 22 5 - 34 U/L   ALT 18 0 - 55 U/L   Total Protein 6.7 6.4 - 8.3 g/dL   Albumin 3.5 3.5 - 5.0 g/dL   Calcium 9.1 8.4 - 10.4 mg/dL   Anion Gap 8 3 - 11 mEq/L   EGFR 80 (L) >90 ml/min/1.73 m2  CA 125  Result Value Ref Range   Cancer Antigen (CA) 125 9.0 0.0 - 38.1 U/mL    CA 125 had been 62 on 03-23-16 and 38 in 12-2015.  Studies/Results: LLE venous doppler 05-09-16 No evidence of left lower extremity deep vein thrombosis. No   evidence of a superficial thrombosis of the greater and lesser   saphenous veins. Positive for thrombus noted in several   varicosities of the calf. - No evidence of Baker&'s cyst on the left.     Medications: I have reviewed the patient's current medications. She has viscous lidocaine if carafate not sufficient for radiation esophagitis. She will hold tamoxifen/ megace for next week, and will use naproxen or ibuprofen and 81 mg ASA for next week. (or full strength ASA if not able to swallow the NSAID)  DISCUSSION Meds as above and warm soaks to left calf areas several times daily. She will let us know how the superficial thrombophlebitis is doing by next week, or call sooner if problems. She will stand and walk at least hourly at work.   She will talk with Dr Sondra Come about timing of imaging after radiation, and if he recommends repeat imaging upcoming.  She is to schedule back to Dr Polly Cobia based on above.  I will see her back at least late Jan in follow up. She is aware that another medical oncologist will be working with gyn oncology at this office after January.  Assessment/Plan:  1.recurrent high grade serous endometrial carcinoma: radiation to paratracheal node in process, on tamoxifen/ megace alternating since 10-2015. Hold hormonal treatment for next week due to superficial thrombophlebitis in LE  varicosities. Mild radiation esophagitis responding to carafate slurry. Recurrence involving para-aortic and common iliac nodes 22 months from completion of adjuvant chemotherapy for [place] poorly differentiated endometrial adenocarcinoma, ER PR +. Even dose reduced adjuvant carboplatin and weekly taxol with granix was difficult due to low counts; PET after initial chemo only uptake in a left periaortic node (+thyroid where previously biopsied). Completed IMRT to retroperitoneal nodes 05-13-15. CT AP 06-17-15 no apparent residual or progressive disease. With some increase in CA125, PET obtained 08-31-15 with uptake right thyroid and  paratracheal node, none in AP. Alternating tamoxifen and megace begun 10-2015 Right paratracheal node  hypermetabolic on PET 6-70-11 and increased in size and activity on PET 03-22-16, now receiving radiation. Foundation One testing done 11-2015.  2. Superficial thromboses in varicose veins left calf: evaluated with venous doppler 05-09-16. New area now, plan as above. 3.Right thyroid nodule: hypermetabolic on PET, biopsy in [facility] system 10-2014 no malignancy. Repeat US biopsy by IR 09-20-15 also benign. TFTs normal. Repeat US done by Dr Forde Dandy 02-10-16, unchanged.  4.intentional weight loss of 70 lbs in 2015. Appetite increased with megace and some weight gain. Encouraged resuming walking 30 min daily outdoors or treadmill. 5.chronic constipation, which was a concern during chemotherapy. Up to date on colonoscopy.  6..BP good without antihypertensives now, follow. Likely improved with weight loss . elevated lipids 7remote minimal tobacco, not enough to qualify for the lung cancer screening program 8.up to date mammograms 9. flu vaccine 02-2016 10.history of low bone density by outside bone scan, for DEXA 03-10-16  All questions answered and she knows to call if concerns, otherwise will let us know how she is in ~ a week. Time spent 25 min including >50% counseling and coordination of  care. CC Drs Polly Cobia, Sondra Come and Sabra Heck.    Evlyn Clines, MD   05/18/2016, 5:37 PM

## 2016-05-19 ENCOUNTER — Ambulatory Visit
Admission: RE | Admit: 2016-05-19 | Discharge: 2016-05-19 | Disposition: A | Discharge status: Home / Self Care | Payer: 59 | Source: Ambulatory Visit | Attending: Radiation Oncology | Admitting: Radiation Oncology

## 2016-05-19 DIAGNOSIS — Z79899 Other long term (current) drug therapy: Secondary | ICD-10-CM | POA: Diagnosis not present

## 2016-05-19 DIAGNOSIS — Z5111 Encounter for antineoplastic chemotherapy: Secondary | ICD-10-CM | POA: Diagnosis not present

## 2016-05-19 DIAGNOSIS — C771 Secondary and unspecified malignant neoplasm of intrathoracic lymph nodes: Secondary | ICD-10-CM | POA: Diagnosis not present

## 2016-05-19 DIAGNOSIS — E041 Nontoxic single thyroid nodule: Secondary | ICD-10-CM | POA: Diagnosis not present

## 2016-05-20 ENCOUNTER — Encounter: Payer: Self-pay | Admitting: Oncology

## 2016-05-20 DIAGNOSIS — I8002 Phlebitis and thrombophlebitis of superficial vessels of left lower extremity: Secondary | ICD-10-CM | POA: Insufficient documentation

## 2016-05-22 ENCOUNTER — Ambulatory Visit
Admission: RE | Admit: 2016-05-22 | Discharge: 2016-05-22 | Disposition: A | Discharge status: Home / Self Care | Payer: 59 | Source: Ambulatory Visit | Attending: Radiation Oncology | Admitting: Radiation Oncology

## 2016-05-22 DIAGNOSIS — Z79899 Other long term (current) drug therapy: Secondary | ICD-10-CM | POA: Diagnosis not present

## 2016-05-22 DIAGNOSIS — Z5111 Encounter for antineoplastic chemotherapy: Secondary | ICD-10-CM | POA: Diagnosis not present

## 2016-05-22 DIAGNOSIS — C771 Secondary and unspecified malignant neoplasm of intrathoracic lymph nodes: Secondary | ICD-10-CM | POA: Diagnosis not present

## 2016-05-22 DIAGNOSIS — E041 Nontoxic single thyroid nodule: Secondary | ICD-10-CM | POA: Diagnosis not present

## 2016-05-23 ENCOUNTER — Ambulatory Visit
Admission: RE | Admit: 2016-05-23 | Discharge: 2016-05-23 | Disposition: A | Discharge status: Home / Self Care | Payer: 59 | Source: Ambulatory Visit | Attending: Radiation Oncology | Admitting: Radiation Oncology

## 2016-05-23 ENCOUNTER — Encounter: Payer: Self-pay | Admitting: Radiation Oncology

## 2016-05-23 ENCOUNTER — Ambulatory Visit: Payer: 59 | Admitting: Radiation Oncology

## 2016-05-23 VITALS — BP 132/76 | HR 78 | Temp 98.5°F | Ht 63.0 in | Wt 157.4 lb

## 2016-05-23 DIAGNOSIS — Z5111 Encounter for antineoplastic chemotherapy: Secondary | ICD-10-CM | POA: Diagnosis not present

## 2016-05-23 DIAGNOSIS — C771 Secondary and unspecified malignant neoplasm of intrathoracic lymph nodes: Secondary | ICD-10-CM

## 2016-05-23 DIAGNOSIS — E041 Nontoxic single thyroid nodule: Secondary | ICD-10-CM | POA: Diagnosis not present

## 2016-05-23 DIAGNOSIS — Z79899 Other long term (current) drug therapy: Secondary | ICD-10-CM | POA: Diagnosis not present

## 2016-05-23 NOTE — Progress Notes (Signed)
  Radiation Oncology         [phone] ________________________________  Name: Diane Lozano MRN: [id number]  Date: 05/23/2016  DOB: 1955/08/11  End of Treatment Note  Diagnosis:   Recurrent endometrial cancer (high-grade serous adenocarcinoma) with metastasis to the retroperitoneal lymph nodes, now with isolated intrathoracic nodal recurrence      Indication for treatment:  Palliative  Radiation treatment dates:   04/13/16 - 06/01/16  Site/dose:   Chest: 56 Gy in 28 fractions  Beams/energy:   3D // [employer]  Narrative: The patient tolerated radiation treatment relatively well. The patient reported occasional nausea and dysphagia/odynophagia (the patient used Carafate). The patient denied a cough or pain. The patient also reported fatigue.  Plan: The patient has completed radiation treatment. The patient will return to radiation oncology clinic for routine followup in one month. I advised them to call or return sooner if they have any questions or concerns related to their recovery or treatment.  -----------------------------------  Blair Promise, PhD, MD  This document serves as a record of services personally performed by Gery Pray, MD. It was created on his behalf by Darcus Austin, a trained medical scribe. The creation of this record is based on the scribe's personal observations and the provider's statements to them. This document has been checked and approved by the attending provider.

## 2016-05-23 NOTE — Progress Notes (Signed)
   Weekly Management Note:  outpatient    ICD-9-CM ICD-10-CM   1. 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  CBCT/MVCT images/Port film x-rays were reviewed.  The chart was checked. Patient denies pain, skin irritation, or cough. She reports occasional feeling of food stuck in her throat. She takes Carafate prn. She reports fatigue.  Physical Findings:  height is 5\' 3"  (1.6 m) and weight is 157 lb 6.4 oz (71.4 kg). Her oral temperature is 98.5 F (36.9 C). Her blood pressure is 132/76 and her pulse is 78. Her oxygen saturation is 100%.   Wt Readings from Last 3 Encounters:  05/23/16 157 lb 6.4 oz (71.4 kg)  05/18/16 160 lb 1.6 oz (72.6 kg)  05/17/16 157 lb 9.6 oz (71.5 kg)  No discernable skin irritation over her torso.  Impression:  The patient has tolerated radiotherapy.  Plan:  A one month follow up card was given to the patient. We discussed eating soft foods to reduce the feeling of food stuck in throat.  ________________________________   Eppie Gibson, M.D.  This document serves as a record of services personally performed by Eppie Gibson, MD. It was created on her behalf by Bethann Humble, a trained medical scribe. The creation of this record is based on the scribe's personal observations and the provider's statements to them. This document has been checked and approved by the attending provider.

## 2016-05-23 NOTE — Progress Notes (Signed)
Xylah Lodico has completed treatment with 28 fractions to her chest.  She denies having pain.  She does report having occasional feeling that food is stuck in her throat.  She takes Carafate as needed.  She denies having a cough.  She does report having fatigue.  She has been given a one month follow up card.  BP 132/76 (BP Location: Left Arm, Patient Position: Sitting)   Pulse 78   Temp 98.5 F (36.9 C) (Oral)   Ht 5\' 3"  (1.6 m)   Wt 157 lb 6.4 oz (71.4 kg)   SpO2 100%   BMI 27.88 kg/m    Wt Readings from Last 3 Encounters:  05/23/16 157 lb 6.4 oz (71.4 kg)  05/18/16 160 lb 1.6 oz (72.6 kg)  05/17/16 157 lb 9.6 oz (71.5 kg)

## 2016-05-24 ENCOUNTER — Ambulatory Visit: Payer: 59 | Admitting: Radiation Oncology

## 2016-05-26 ENCOUNTER — Telehealth: Payer: Self-pay

## 2016-05-26 NOTE — Telephone Encounter (Signed)
Pt called to update Dr Marko Plume on superficial blood clots in left lower leg vericose veins as requested. She stopped tamoxifen for 1 week. Started on ASA 81 and she did take regularly. She was to take ibuprofen which she was not good at, she forget because she was not in pain. She is wearing compression stockings. Using heat regularly. She reports her leg is slightly better at lower calf, and still present at upper calf.  Instructed her to continue as is until she hears from Dr Andria Meuse or RN on Tuesday.

## 2016-05-30 ENCOUNTER — Telehealth: Payer: Self-pay

## 2016-05-30 NOTE — Telephone Encounter (Addendum)
S/w pt per Dr Mariana Kaufman attached note. The area is mildy improved. Discussed giving herself time to heal. Discussed f/u appt date

## 2016-05-30 NOTE — Telephone Encounter (Signed)
-----   Message from [name], MD sent at 05/26/2016  9:06 PM EST ----- Regarding: superficial varicose veins Juliann Pulse -  If still present next week, she should be sure to take the ibuprofen bid with food and continue 81 mg ASA  Thank you

## 2016-06-15 MED FILL — MEGESTROL 40 MG TABLET: 40 | 21 days supply | Qty: 84 | Fill #1

## 2016-06-15 MED FILL — TAMOXIFEN 20 MG TABLET: 20 | 21 days supply | Qty: 42 | Fill #2

## 2016-06-15 MED FILL — VENLAFAXINE HCL ER 75 MG CA: 75 | 30 days supply | Qty: 90 | Fill #1

## 2016-06-15 MED FILL — SIMVASTATIN 40 MG TABLET: 40 | 90 days supply | Qty: 90 | Fill #1

## 2016-06-24 ENCOUNTER — Other Ambulatory Visit: Payer: Self-pay | Admitting: Oncology

## 2016-06-24 DIAGNOSIS — C771 Secondary and unspecified malignant neoplasm of intrathoracic lymph nodes: Secondary | ICD-10-CM

## 2016-06-24 DIAGNOSIS — C541 Malignant neoplasm of endometrium: Secondary | ICD-10-CM

## 2016-06-29 ENCOUNTER — Ambulatory Visit (HOSPITAL_BASED_OUTPATIENT_CLINIC_OR_DEPARTMENT_OTHER): Payer: 59 | Admitting: Oncology

## 2016-06-29 ENCOUNTER — Other Ambulatory Visit (HOSPITAL_BASED_OUTPATIENT_CLINIC_OR_DEPARTMENT_OTHER): Payer: 59

## 2016-06-29 VITALS — BP 142/77 | HR 98 | Temp 98.4°F | Resp 18 | Wt 158.5 lb

## 2016-06-29 DIAGNOSIS — I8393 Asymptomatic varicose veins of bilateral lower extremities: Secondary | ICD-10-CM

## 2016-06-29 DIAGNOSIS — C771 Secondary and unspecified malignant neoplasm of intrathoracic lymph nodes: Secondary | ICD-10-CM

## 2016-06-29 DIAGNOSIS — D72819 Decreased white blood cell count, unspecified: Secondary | ICD-10-CM

## 2016-06-29 DIAGNOSIS — Z79899 Other long term (current) drug therapy: Secondary | ICD-10-CM

## 2016-06-29 DIAGNOSIS — I8002 Phlebitis and thrombophlebitis of superficial vessels of left lower extremity: Secondary | ICD-10-CM

## 2016-06-29 DIAGNOSIS — C541 Malignant neoplasm of endometrium: Secondary | ICD-10-CM | POA: Diagnosis not present

## 2016-06-29 LAB — COMPREHENSIVE METABOLIC PANEL
ALT: 18 U/L (ref 0–55)
AST: 20 U/L (ref 5–34)
Albumin: 3.9 g/dL (ref 3.5–5.0)
Alkaline Phosphatase: 52 U/L (ref 40–150)
Anion Gap: 8 mEq/L (ref 3–11)
BUN: 16.3 mg/dL (ref 7.0–26.0)
CO2: 27 mEq/L (ref 22–29)
Calcium: 9.6 mg/dL (ref 8.4–10.4)
Chloride: 108 mEq/L (ref 98–109)
Creatinine: 0.8 mg/dL (ref 0.6–1.1)
EGFR: 80 mL/min/{1.73_m2} — ABNORMAL LOW (ref >90)
Glucose: 87 mg/dl (ref 70–140)
Potassium: 3.8 mEq/L (ref 3.5–5.1)
Sodium: 142 mEq/L (ref 136–145)
Total Bilirubin: 0.5 mg/dL (ref 0.20–1.20)
Total Protein: 6.8 g/dL (ref 6.4–8.3)

## 2016-06-29 LAB — CBC WITH DIFFERENTIAL/PLATELET
BASO%: 2.4 % — ABNORMAL HIGH (ref 0.0–2.0)
Basophils Absolute: 0.1 10*3/uL (ref 0.0–0.1)
EOS%: 8.1 % — ABNORMAL HIGH (ref 0.0–7.0)
Eosinophils Absolute: 0.2 10*3/uL (ref 0.0–0.5)
HCT: 35.8 % (ref 34.8–46.6)
HGB: 12.3 g/dL (ref 11.6–15.9)
LYMPH%: 16.4 % (ref 14.0–49.7)
MCH: 33.7 pg (ref 25.1–34.0)
MCHC: 34.4 g/dL (ref 31.5–36.0)
MCV: 98 fL (ref 79.5–101.0)
MONO#: 0.2 10*3/uL (ref 0.1–0.9)
MONO%: 8.8 % (ref 0.0–14.0)
NEUT#: 1.6 10*3/uL (ref 1.5–6.5)
NEUT%: 64.3 % (ref 38.4–76.8)
Platelets: 153 10*3/uL (ref 145–400)
RBC: 3.65 10*6/uL — ABNORMAL LOW (ref 3.70–5.45)
RDW: 12.5 % (ref 11.2–14.5)
WBC: 2.5 10*3/uL — ABNORMAL LOW (ref 3.9–10.3)
lymph#: 0.4 10*3/uL — ABNORMAL LOW (ref 0.9–3.3)

## 2016-06-29 NOTE — Progress Notes (Signed)
OFFICE PROGRESS NOTE   June 29, 2016   Physicians: Yevonne Aline Tobie Lords Kirklin, [place] (gyn onc [facility])  INTERVAL HISTORY:   Patient is seen, alone for visit, in continuing attention to recurrent endometrial cancer, recently treated with radiation to the paratracheal node and on tamoxifen alternating with megace since 10-2015 until held in 05-2016. Radiation was 56 Gy to the isolated paratracheal node, given 04-13-16 thru 06-01-16.She has follow up visit with Dr Sondra Come on 07-06-16. Last imaging was PET 03-22-16. Tamoxifen and Megace have been held since 05-18-16 due to superficial thrombophlebitis in LE, where she has extensive varicose veins. The areas of thrombophlebitis have all resolved and she would like to try back on tamoxifen/ megace.   She is to see Dr Forde Dandy upcoming, in follow up of thyroid nodule.  Patient is feeling very well. Mild radiation esophagitis has resolved and she has no symptoms of concern central chest, no SOB or other respiratory symptoms. Appetite is more manageable off of megace, with intentional weight loss 1.5 lbs today. No abdominal or pelvic discomfort, bowels ok. Energy is good. Varicose veins are most bothersome in thighs, especially on left, sometimes throb after she has stood thru day but no areas of acute erythema or tenderness as previously. She is now wearing knee high teds, but agrees to get thigh high fitted by medical supply. She has had no fever or symptoms of infection.  Remainder of 14 point Review of Systems negative.   No central catheter.  No genetics testing CA 125 132 on 11-27-14. 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   ONCOLOGIC HISTORY Patient presented to PCP with intermittent vaginal bleeding since ~ Oct 2013, endometrial biopsy 06-20-12 with complex endometrial hyperplasia with atypia. Pelvic and transvaginal US in [facility] system on 06-26-12 showed endometrial thickening of 2.7 cm, right ovary not clearly seen and left ovary with normal postmenopausal appearance. She was seen by Dr Christophe Louis and referred to Dr Polly Cobia. She had robotic hysterectomy with BSO and bilateral pelvic and periaortic node dissection by Dr Polly Cobia at [facility] on 07-30-2012. Intraoperatively she had large endometrial polyp with high grade malignancy, no obvious extrauterine disease. Pathology ([facility] [place] [id number] from 07-30-2012) had high grade, poorly differentiated endometrial adenocarcinoma with solid and serous papillary components, 2 periaortic nodes negative and 9 right pelvic/11 left pelvic nodes negative. The tumor was 4.8 cm confined to polyp with no definite myometrial involvement, margins clear and no angiolymphatic invasion identified.  Cycle 1 taxol carboplatin was given 08-30-12, 6 cycles of q 3 week regimen given thru 12-13-2012, with gCSF support. Radiation was intracavitary HDR to proximal vagina 30 Gy in 5 fractions from May 27 thru November 27, 2012, by Dr Sondra Come. Patient was followed closely by Dr Polly Cobia after completing adjuvant taxol carboplatin chemotherapy in 12-2012. Patient felt very well, with intentional 70 lb weight loss over a year from Jan thru Dec 2015 on [employer]. She saw Dr Polly Cobia with unremarkable exam in March 2016, however CA 125 was slightly elevated at 36.3, up from previous 29.2. She had CT CAP with contrast 10-16-14 with new left periaortic and left common iliac lymphadenopathy. PET  0-21-11 had hypermetabolic uptake in retroperitoneal nodes from lower poles of kidneys to aortic bifurcation,largest 2.0 x 2.1 cm, as well as uptake  in either right supraclavicular node vs right lobe of thyroid (report below). FNA of thyroid 10-28-14 with benign tissue, [place] at time of biopsy reportedly showing no right supraclavicular adenopathy. She had CT needle biopsy of left para-aortic node 11-16-14 with path showing serous carcinoma. She began chemotherapy with dose dense carbo taxol on 11-27-14, with carbo at AUC = 5. Over next 3 weeks WBC and platelets were too low for treatments, despite granix. She had taxol only cycle 2, then [name] at AUC=2 + taxol cycle 3. Counts maintained with increase in gCSF to 480 mcg 3-4 doses after each treatment, including increase in [name] for third dose to AUC =3 on 02-11-15. PET 03-01-15 had uptake in one left peri-aortic node and the area of previous benign biopsy right lobe thyroid. She received IMRT to isolated recurrence in retroperitoneal lymph nodes10-31-16  through 12- 8-16,  50.4 gray in 28 fractions with simultaneous integrated boost to 56 gray CT AP 06-17-15 showed no apparent residual or progressive disease. PET 08-31-15 no findings of concern for persistent or progressive disease in abdomen or pelvis, the same hypermetabolic right thyroid nodule (2.8 cm with SUV 10.4 compared with 2.7 cm with SUV 10 on prior) and new right paratracheal adenopathy with node 2.3 cm and SUV 17.7.  Surgical path from 07-30-12 tested for ER PR on 09-06-15, with ER + intermediate in 25-35% and PR + strong in 25-35%.  Biopsy of thyroid nodule benign 09-20-15.  She began tamoxifen/ megace 09-2015. PET 03-22-16 had increase in the same hypermetabolic paratracheal node and stable right thyroid uptake, no other areas of concern. She received 56 Gy to paratracheal node 04-13-16 thru 06-01-16.    Objective:  Vital signs in last 24 hours:  BP (!) 142/77 (BP Location: Left Arm, Patient Position: Sitting)   Pulse 98   Temp 98.4 F (36.9 C) (Oral)   Resp 18   Wt 158 lb 8 oz (71.9 kg)   SpO2 99%   BMI 28.08 kg/m  Weight down 1.5  lbs. Alert, oriented and appropriate, looks comfortable, very pleasant as always. Ambulatory without difficulty.   HEENT:PERRL, sclerae not icteric. Oral mucosa moist without lesions, posterior pharynx clear.  Neck supple. No JVD.  Lymphatics:no cervical,supraclavicular or inguinal adenopathy Resp: clear to auscultation bilaterally and normal percussion bilaterally Cardio: regular rate and rhythm. No gallop. GI: soft, nontender, not distended, no mass or organomegaly. Normally active bowel sounds.  Musculoskeletal/ Extremities: LE without pitting edema, cords, tenderness. Prominent superficial varicose veins thruout LE, no erythema/ heat/ cords/ tenderness now.  Neuro: no peripheral neuropathy. Otherwise nonfocal. PSYCH appropriate mood and affect Skin without rash, ecchymosis, petechiae   Lab Results:  Results for orders placed or performed in visit on 06/29/16  CBC with Differential  Result Value Ref Range   WBC 2.5 (L) 3.9 - 10.3 10e3/uL   NEUT# 1.6 1.5 - 6.5 10e3/uL   HGB 12.3 11.6 - 15.9 g/dL   HCT 35.8 34.8 - 46.6 %   Platelets 153 145 - 400 10e3/uL   MCV 98.0 79.5 - 101.0 fL   MCH 33.7 25.1 - 34.0 pg   MCHC 34.4 31.5 - 36.0 g/dL   RBC 3.65 (L) 3.70 - 5.45 10e6/uL   RDW 12.5 11.2 - 14.5 %   lymph# 0.4 (L) 0.9 - 3.3 10e3/uL   MONO# 0.2 0.1 - 0.9 10e3/uL  Eosinophils Absolute 0.2 0.0 - 0.5 10e3/uL   Basophils Absolute 0.1 0.0 - 0.1 10e3/uL   NEUT% 64.3 38.4 - 76.8 %   LYMPH% 16.4 14.0 - 49.7 %   MONO% 8.8 0.0 - 14.0 %   EOS% 8.1 (H) 0.0 - 7.0 %   BASO% 2.4 (H) 0.0 - 2.0 %  Comprehensive metabolic panel  Result Value Ref Range   Sodium 142 136 - 145 mEq/L   Potassium 3.8 3.5 - 5.1 mEq/L   Chloride 108 98 - 109 mEq/L   CO2 27 22 - 29 mEq/L   Glucose 87 70 - 140 mg/dl   BUN 16.3 7.0 - 26.0 mg/dL   Creatinine 0.8 0.6 - 1.1 mg/dL   Total Bilirubin 0.50 0.20 - 1.20 mg/dL   Alkaline Phosphatase 52 40 - 150 U/L   AST 20 5 - 34 U/L   ALT 18 0 - 55 U/L   Total Protein  6.8 6.4 - 8.3 g/dL   Albumin 3.9 3.5 - 5.0 g/dL   Calcium 9.6 8.4 - 10.4 mg/dL   Anion Gap 8 3 - 11 mEq/L   EGFR 80 (L) >90 ml/min/1.73 m2   CA 125 available after visit 5.3, this having been 9.0 in 05-2016 and 62 on 03-23-16.   Studies/Results:  No results found.  Medications: I have reviewed the patient's current medications. She will resume tamoxifen 20 mg bid daily x 21 days alternating with megace 80 mg bid x 21 days.  DISCUSSION Clinically doing well and CA 125 marker, which has been useful with her disease, much lower, which we will let her know.  She will resume tamoxifen alternating with megace, but understands that either of these, and particularly megace, may increase tendency to blood clots including in superficial varicose veins. If recurrent symptoms of superficial thrombophlebitis, she is to stop the hormonal therapy and call. ASA/ NSAID and warm soaks were helpful previously.   I have also encouraged her to have evaluation for the varicose veins; she will discuss with PCP Dr Sabra Heck. I have encouraged her to get thigh high teds, as worst of the varicose veins are in thighs, and the knee high teds have clearly imrpoved lower leg symptoms.   She will keep appointment with Dr Sondra Come in Feb as scheduled.  She will see Dr Alvy Bimler in ~ April. She plans to see Dr Polly Cobia after next scans, tho timing of scans not determined, and likely could easily be out several more months.  She has also seen Dr A.Secord gyn onc [place], for consultation previously.   Assessment/Plan:  1.recurrent high grade serous endometrial carcinoma: radiation to isolated paratracheal node completed 05-22-16. CA 125 down to 5. Resume tamoxifen/ megace if tolerated. Nothing clinically requiring immediate repeat imaging.  History is of recurrence involving para-aortic and common iliac nodes 22 months from completion of adjuvant chemotherapy for IA poorly differentiated endometrial adenocarcinoma, ER PR +. Even  dose reduced adjuvant carboplatin and weekly taxol was difficult due to low counts; PET after initial chemo only uptake in a left periaortic node (+thyroid where previously biopsied). Completed IMRT to retroperitoneal nodes 05-13-15.  With increase in CA125, PET  08-31-15 had uptake right thyroid and right paratracheal node, none in AP. Alternating tamoxifen and megace begun 10-2015 Right paratracheal node increased in size and activity on PET 03-2016. Radiation 56 Gy given thru 06-01-16.  2. Extensive LE varicose veins x years, with superficial thrombophlebitis several areas in 05-2016, now resolved. Tamoxifen/ megace held in case those were  etiology of thrombophlebitis. Patient to try thigh high teds and request referral to vein clinic from PCP. Hold tamoxifen/ megace and contact MD if reoccurs.  3.Right thyroid nodule: hypermetabolic on PET, biopsy in [facility] system 10-2014 no malignancy. Repeat US biopsy by IR 09-20-15 also benign. TFTs normal. Repeat US done by Dr Forde Dandy 02-10-16 unchanged.  4.intentional weight loss of 70 lbs in 2015. Appetite increased with megace and some weight gain. Encouraged resuming walking 30 min daily outdoors or treadmill. 5.chronic constipation, which was a concern during chemotherapy. Up to date on colonoscopy.  6..BP good without antihypertensives now, follow. Likely improved with weight loss . elevated lipids 7remote minimal tobacco, not enough to qualify for the lung cancer screening program 8.up to date mammograms 9. flu vaccine 02-2016 10.history of low bone density by outside bone scan, for DEXA 03-10-16 11. Cytopenias were limiting with previous chemo, despite dose reductions and gCSF. Counts low normal now.   All questions answered and patient is in agreement with recommendations and plans. Time spent 25 min including >50% counseling and coordination of care. CC Drs Polly Cobia, Antonietta Barcelona, [facility]   Evlyn Clines, MD   06/29/2016, 5:13 PM

## 2016-06-30 ENCOUNTER — Telehealth: Payer: Self-pay

## 2016-06-30 LAB — CA 125: Cancer Antigen (CA) 125: 5.3 U/mL (ref 0.0–38.1)

## 2016-06-30 NOTE — Telephone Encounter (Signed)
-----   Message from [name], MD sent at 06/30/2016  8:42 AM EST ----- Labs seen and need follow up: please let her know that CA 125 is 5.3!

## 2016-06-30 NOTE — Telephone Encounter (Signed)
S/w pt CA 125 is 5.3. Pt was very happy.

## 2016-07-01 ENCOUNTER — Encounter: Payer: Self-pay | Admitting: Oncology

## 2016-07-01 DIAGNOSIS — I8393 Asymptomatic varicose veins of bilateral lower extremities: Secondary | ICD-10-CM | POA: Insufficient documentation

## 2016-07-01 DIAGNOSIS — D72819 Decreased white blood cell count, unspecified: Secondary | ICD-10-CM | POA: Insufficient documentation

## 2016-07-03 DIAGNOSIS — C541 Malignant neoplasm of endometrium: Secondary | ICD-10-CM | POA: Diagnosis not present

## 2016-07-03 DIAGNOSIS — E041 Nontoxic single thyroid nodule: Secondary | ICD-10-CM | POA: Diagnosis not present

## 2016-07-03 DIAGNOSIS — Z1389 Encounter for screening for other disorder: Secondary | ICD-10-CM | POA: Diagnosis not present

## 2016-07-05 ENCOUNTER — Encounter: Payer: Self-pay | Admitting: Oncology

## 2016-07-06 ENCOUNTER — Encounter: Payer: Self-pay | Admitting: Radiation Oncology

## 2016-07-06 ENCOUNTER — Ambulatory Visit
Admission: RE | Admit: 2016-07-06 | Discharge: 2016-07-06 | Disposition: A | Discharge status: Home / Self Care | Payer: 59 | Source: Ambulatory Visit | Attending: Radiation Oncology | Admitting: Radiation Oncology

## 2016-07-06 VITALS — BP 138/86 | HR 75 | Temp 99.0°F | Ht 63.0 in | Wt 161.2 lb

## 2016-07-06 DIAGNOSIS — C772 Secondary and unspecified malignant neoplasm of intra-abdominal lymph nodes: Secondary | ICD-10-CM | POA: Insufficient documentation

## 2016-07-06 DIAGNOSIS — C771 Secondary and unspecified malignant neoplasm of intrathoracic lymph nodes: Secondary | ICD-10-CM | POA: Insufficient documentation

## 2016-07-06 DIAGNOSIS — Z79899 Other long term (current) drug therapy: Secondary | ICD-10-CM | POA: Diagnosis not present

## 2016-07-06 DIAGNOSIS — C541 Malignant neoplasm of endometrium: Secondary | ICD-10-CM | POA: Diagnosis not present

## 2016-07-06 NOTE — Progress Notes (Signed)
Radiation Oncology         [phone] ________________________________  Name: Diane Lozano MRN: [id number]  Date: 07/06/2016  DOB: 1955/12/18  Follow-Up Visit Note  CC: [name], MD  [name], MD    ICD-9-CM ICD-10-CM   1. Endometrial cancer (HCC) 182.0 C54.1   2. 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 She denies shortness of breath, cough, or pain at this time. She notes her fatigue is improving. She reports a good appetite.  No difficulties with swallowing                            ALLERGIES:  has No Known Allergies.  Meds: Current Outpatient Prescriptions  Medication Sig Dispense Refill  . Calcium Carb-Cholecalciferol 500-600 MG-UNIT TABS Take 1 tablet by mouth every morning.     . Cholecalciferol (VITAMIN D3) 2000 units TABS Take 2,000 Units by mouth every other day.     . megestrol (MEGACE) 40 MG tablet Take 2 tablets (80 mg total) by mouth 2 (two) times daily. X 3 weeks alternating with tamoxifen (Patient taking differently: Take 80 mg by mouth 2 (two) times daily. X 3 weeks alternating with tamoxifen) 84 tablet 3  . simvastatin (ZOCOR) 40 MG tablet Take 40 mg by mouth every evening.    . tamoxifen (NOLVADEX) 20 MG tablet Take 1 tablet (20 mg total) by mouth 2 (two) times daily. X 3 weeks alternating with Megace. 42 tablet 3  . venlafaxine XR (EFFEXOR-XR) 75 MG 24 hr capsule Take 75 mg by mouth daily with breakfast.     . sucralfate (CARAFATE) 1 g tablet Dissolve 1 tablet in water and use 4 times a day before meals and at bedtime for esophagitis (Patient not taking: Reported on 07/06/2016) 120 tablet 0    No current facility-administered medications for this encounter.     Physical Findings: The patient is in no acute distress. Patient is alert and oriented.  height is 5\' 3"  (1.6 m) and weight is 161 lb 3.2 oz (73.1 kg). Her temperature is 99 F (37.2 C). Her blood pressure is 138/86 and her pulse is 75. Her oxygen saturation is 98%. .  No significant changes. Lungs are clear to auscultation bilaterally. Heart has regular rate and rhythm. No palpable cervical, supraclavicular, or axillary adenopathy. Abdomen soft, non-tender, normal bowel sounds.   Lab Findings: Lab Results  Component Value Date   WBC 2.5 (L) 06/29/2016   HGB 12.3 06/29/2016   HCT 35.8 06/29/2016   MCV 98.0 06/29/2016   PLT 153 06/29/2016    Radiographic Findings: No results found.  Impression:  Patient is doing well since her radiation therapy to the chest region. She denies any cough or shortness of breath. She notes a good energy level. Patient's CA-125 has dropped significantly down to 5. I would recommend holding off on PET scan in light of her recent completion of radiation.  Plan:  Follow up with Dr. Alvy Bimler in April and she will likely have a PET scan scheduled soon after this visit. Follow up with radiation oncology in 6 months.  ____________________________________    This document  serves as a record of services personally performed by Gery Pray, MD. It was created on his behalf by Bethann Humble, a trained medical scribe. The creation of this record is based on the scribe's personal observations and the provider's statements to them. This document has been checked and approved by the attending provider.

## 2016-07-06 NOTE — Progress Notes (Signed)
Ms. Gabbert presents for follow up of radiation completed 05/23/16 to her Chest. She denies pain. She reports fatigue but improved. She denies any shortness of breath or cough. She is eating well. She has no other concerns at this time.   BP 138/86   Pulse 75   Temp 99 F (37.2 C)   Ht 5\' 3"  (1.6 m)   Wt 161 lb 3.2 oz (73.1 kg)   SpO2 98% Comment: room air  BMI 28.56 kg/m    Wt Readings from Last 3 Encounters:  07/06/16 161 lb 3.2 oz (73.1 kg)  06/29/16 158 lb 8 oz (71.9 kg)  05/23/16 157 lb 6.4 oz (71.4 kg)

## 2016-07-07 ENCOUNTER — Other Ambulatory Visit: Payer: Self-pay | Admitting: Hematology and Oncology

## 2016-07-07 DIAGNOSIS — C771 Secondary and unspecified malignant neoplasm of intrathoracic lymph nodes: Secondary | ICD-10-CM

## 2016-07-07 DIAGNOSIS — C541 Malignant neoplasm of endometrium: Secondary | ICD-10-CM

## 2016-07-10 ENCOUNTER — Telehealth: Payer: Self-pay | Admitting: Hematology and Oncology

## 2016-07-10 NOTE — Telephone Encounter (Signed)
lvm to inform pt of 3/2 appt at 1 pm per LOS

## 2016-07-19 DIAGNOSIS — M25562 Pain in left knee: Secondary | ICD-10-CM | POA: Diagnosis not present

## 2016-08-04 ENCOUNTER — Other Ambulatory Visit (HOSPITAL_BASED_OUTPATIENT_CLINIC_OR_DEPARTMENT_OTHER): Payer: 59

## 2016-08-04 ENCOUNTER — Encounter: Payer: Self-pay | Admitting: Hematology and Oncology

## 2016-08-04 ENCOUNTER — Ambulatory Visit (HOSPITAL_BASED_OUTPATIENT_CLINIC_OR_DEPARTMENT_OTHER): Payer: 59 | Admitting: Hematology and Oncology

## 2016-08-04 DIAGNOSIS — I8002 Phlebitis and thrombophlebitis of superficial vessels of left lower extremity: Secondary | ICD-10-CM | POA: Diagnosis not present

## 2016-08-04 DIAGNOSIS — C541 Malignant neoplasm of endometrium: Secondary | ICD-10-CM | POA: Diagnosis not present

## 2016-08-04 DIAGNOSIS — R946 Abnormal results of thyroid function studies: Secondary | ICD-10-CM

## 2016-08-04 DIAGNOSIS — C771 Secondary and unspecified malignant neoplasm of intrathoracic lymph nodes: Secondary | ICD-10-CM | POA: Diagnosis not present

## 2016-08-04 DIAGNOSIS — C772 Secondary and unspecified malignant neoplasm of intra-abdominal lymph nodes: Secondary | ICD-10-CM

## 2016-08-04 LAB — COMPREHENSIVE METABOLIC PANEL
ALT: 12 U/L (ref 0–55)
AST: 17 U/L (ref 5–34)
Albumin: 3.9 g/dL (ref 3.5–5.0)
Alkaline Phosphatase: 44 U/L (ref 40–150)
Anion Gap: 9 mEq/L (ref 3–11)
BUN: 20 mg/dL (ref 7.0–26.0)
CO2: 24 mEq/L (ref 22–29)
Calcium: 9.6 mg/dL (ref 8.4–10.4)
Chloride: 107 mEq/L (ref 98–109)
Creatinine: 0.8 mg/dL (ref 0.6–1.1)
EGFR: 77 mL/min/{1.73_m2} — ABNORMAL LOW (ref >90)
Glucose: 89 mg/dl (ref 70–140)
Potassium: 3.6 mEq/L (ref 3.5–5.1)
Sodium: 140 mEq/L (ref 136–145)
Total Bilirubin: 0.36 mg/dL (ref 0.20–1.20)
Total Protein: 7.1 g/dL (ref 6.4–8.3)

## 2016-08-04 LAB — CBC WITH DIFFERENTIAL/PLATELET
BASO%: 1.3 % (ref 0.0–2.0)
Basophils Absolute: 0.1 10*3/uL (ref 0.0–0.1)
EOS%: 3.8 % (ref 0.0–7.0)
Eosinophils Absolute: 0.2 10*3/uL (ref 0.0–0.5)
HCT: 36.7 % (ref 34.8–46.6)
HGB: 12.5 g/dL (ref 11.6–15.9)
LYMPH%: 16.2 % (ref 14.0–49.7)
MCH: 33.5 pg (ref 25.1–34.0)
MCHC: 34 g/dL (ref 31.5–36.0)
MCV: 98.3 fL (ref 79.5–101.0)
MONO#: 0.4 10*3/uL (ref 0.1–0.9)
MONO%: 8.2 % (ref 0.0–14.0)
NEUT#: 3.4 10*3/uL (ref 1.5–6.5)
NEUT%: 70.5 % (ref 38.4–76.8)
Platelets: 204 10*3/uL (ref 145–400)
RBC: 3.73 10*6/uL (ref 3.70–5.45)
RDW: 12.3 % (ref 11.2–14.5)
WBC: 4.9 10*3/uL (ref 3.9–10.3)
lymph#: 0.8 10*3/uL — ABNORMAL LOW (ref 0.9–3.3)

## 2016-08-04 NOTE — Assessment & Plan Note (Signed)
She has received  original adjuvant treatment to her pelvis and most recently palliative radiation treatment to the mediastinum Currently, she is not symptomatic. She had no residual esophagitis from recent radiation treatment. As above, plan to repeat imaging study for objective response to treatment

## 2016-08-04 NOTE — Assessment & Plan Note (Signed)
She had significant bilateral varicose veins on lower extremities with recent superficial thrombophlebitis. She is at high risk of DVT given treatment with tamoxifen and Megace. I recommend aspirin therapy and for her to use elastic compression hose on a regular basis

## 2016-08-04 NOTE — Assessment & Plan Note (Signed)
She had recent multiple evaluation for abnormal thyroid nodule noted on PET CT scan that came back benign. I am not concerned about this and recommend observation only. 

## 2016-08-04 NOTE — Progress Notes (Signed)
[facility] FOLLOW-UP progress notes  Patient Care Team: [name], MD as PCP - General (Family Medicine)  CHIEF COMPLAINTS/PURPOSE OF VISIT:  Recurrent endometrial cancer  HISTORY OF PRESENTING ILLNESS:  Diane Lozano 61 y.o. female was transferred to my care after her prior physician has left.  I reviewed the patient's records extensive and collaborated the history with the patient. Summary of her history is as follows:   Endometrial cancer ([place])   06/20/2012 Pathology Results    Biopsy positive for papillary serous carcinoma      06/20/2012 Genetic Testing    [employer] One testing done 11-2015 on surgical path from 2014: MS stable TMB low 4 muts/mb ATM [id number] ERBB3 T389K E2H2 rearrangement exon 9 PPP2R1A P179R TP53 I195N       06/20/2012 Initial Diagnosis    Patient presented to PCP with intermittent vaginal bleeding since ~ Oct 2013, endometrial biopsy 06-20-12 with complex endometrial hyperplasia with atypia      06/26/2012 Imaging    Thickened endometrial lining in a postmenopausal patient experiencing vaginal bleeding. In the setting of post-menopausal bleeding, endometrial sampling is indicated to exclude carcinoma. No focal myometrial abnormalities are seen.  Normal left ovary and non-visualized right ovary      07/30/2012 Surgery    Dr. Polly Cobia performed robotic hysterectomy with bilateral salpingo-oophorectomy, bilateral pelvic lymph node dissection and periaortic lymph node dissection. Intraoperatively on frozen section, the patient was noted to have a large endometrial polyp with changes within the polyp consistent for high-grade malignancy, possibly papillary serous carcinoma. There is no obvious extrauterine disease noted.        07/30/2012 Pathology Results    [phone] SUPPLEMENTAL REPORT  THE ENDOMETRIAL CARCINOMA WAS ANALYZED FOR DNA MISMATCH REPAIR PROTEINS.IMMUNOHISTOCHEMICALLY, THE NEOPLASM RETAINED NUCLEAR  EXPRESSION OF 4 GENE PRODUCTS, MLH1, MSH2, MSH6, AND PMS2, INVOLVED IN DNA MISMATCH REPAIR.POSITIVE AND NEGATIVE CONTROLS WORKED APPROPRIATELY.  PER REQUEST, AN ER AND PR ARE PERFORMED ON BLOCK 1I.  ER- APPROXIMATELY 25-35% STAINING IN NEOPLASTIC CELLS (INTERMEDIATE)  PR- APPROXIMATELY 25-35% STAINING IN NEOPLASTIC CELLS (STRONG)  ER AND PR PREDOMINANTLY SHOW STAINING IN THE SEROUS COMPONENT.  DIAGNOSIS  1. UTERUS, CERVIX WITH BILATERAL FALLOPIAN TUBES AND OVARIES,  HYSTERECTOMY AND BILATERAL SALPINGO-OOPHORECTOMY:  HIGH GRADE/POORLY DIFFERENTIATED ENDOMETRIAL ADENOCARCINOMA WITH SOLID AND SEROUS PAPILLARY COMPONENTS.  HISTOPATHOLOGIC TYPE:A VARIETY OF PATTERNS WERE PRESENT, ALL OF WHICH SHOULD BE CONSIDERED TO BE HIGH GRADE.SEROUS PAPILLARY CARCINOMA WAS SEEN OCCURRING ADJACENT TO SOLID ENDOMETRIAL CARCINOMA WITH MARKED ANAPLASIA AND GIANT CELLS. SIZE:TUMOR MEASURED AT LEAST 4.8 CM IN GREATEST HORIZONTAL DIMENSION.  GRADE: POORLY DIFFERENTIATED OR HIGH GRADE. DEPTH OF INVASION: NO DEFINITE MYOMETRIAL INVOLVEMENT WAS SEEN.THE TUMOR APPEARED CONFINED TO THE POLYP AS WELL AS SURFACE ENDOMETRIUM. WHERE MYOMETRIAL THICKNESS NOT APPLICABLE. SEROSAL INVOLVEMENT: NOT DEMONSTRATED.  ENDOCERVICAL INVOLVEMENT:NOT DEMONSTRATED. RESECTION MARGINS: FREE OF INVOLVEMENT. EXTRAUTERINE EXTENSION:NOT DEMONSTRATED. ANGIOLYMPHATIC INVASION: NOT DEFINITELY SEEN. TOTAL NODES EXAMINED:22.  PELVIC NODES EXAMINED:20.  PELVIC NODES INVOLVED:0.  PARA-AORTIC NODES 2. EXAMINED:  PARA-AORTIC NODES 0. INVOLVED TNM STAGE: T1A N0 MX. AJCC STAGE GROUPING: IA. FIGO STAGE:IA.      08/26/2012 Imaging    No CT evidence for intra-abdominal or pelvic metastatic disease. Trace free pelvic fluid, presumably postoperative although the date of surgery is not documented in the electronic medical record.      08/26/2012 -  12/13/2012 Chemotherapy    She received 6 cycles of carbo/taxol       10/29/2012 - 11/27/2012 Radiation Therapy    May 27, June 5,  June 11, June 19, November 27, 2012: Proximal vagina 30 Gy in 5 fractions        01/17/2013 Imaging    1.  No evidence of recurrent or metastatic disease. 2.  No acute abnormality involving the abdomen or pelvis. 3.  Mild diffuse hepatic steatosis. 4.  Very small supraumbilical midline anterior abdominal wall hernia containing fat, unchanged      01/22/2014 Imaging    No evidence for metastatic or recurrent disease. 2. No bowel obstruction.  Normal appendix. 3. Small fat containing hernia, stable in appearance. 4. Status post hysterectomy and bilateral oophorectomy      02/19/2014 Imaging    No pulmonary lesions are identified. 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 No other sites of metastatic disease identified within the abdomen or pelvis.        11/27/2014 - 02/11/2015 Chemotherapy    She received 4 cycles of carbo/taxol       03/01/2015 PET scan    Single hypermetabolic small retroperitoneal lymph node along the aorta. 2. No evidence of metastatic disease otherwise in the abdomen or pelvis. No evidence local recurrence. 3. Intensely hypermetabolic enlarged nodule adjacent to the RIGHT lobe of thyroid gland. This presumably represents the biopsied lesion in clinician report which was found to be benign thyroid tissue.      04/05/2015 - 05/13/2015 Radiation Therapy    She received 50.4 gray in 28 fractions with simultaneous integrated boost to 56 gray      06/17/2015 Imaging    No acute process or evidence of metastatic disease in the abdomen or pelvis. Resolution of previously described retroperitoneal adenopathy.  2.  Possible constipation. 3. Atherosclerosis.      06/17/2015 Tumor Marker    Patient's tumor was tested for the following markers: CA125 Results of the tumor marker test revealed 33      08/12/2015 Tumor Marker    Patient's tumor was tested for the following markers: CA125 Results of the tumor marker test revealed 52      08/31/2015 PET scan    Development of right paratracheal hypermetabolic adenopathy, consistent with nodal metastasis. 2. The previously described isolated abdominal retroperitoneal hypermetabolic node has resolved. 3. Persistent hypermetabolic right thyroid nodule, per report previously biopsied. Correlate with those results. 4.  Possible constipation.      09/09/2015 -  Anti-estrogen oral therapy    She has been receiving alternative treatment between megace and tamoxifen      09/09/2015 Tumor Marker    Patient's tumor was tested for the following markers: CA125 Results of the tumor marker test revealed 37.8      09/20/2015 Procedure    Technically successful ultrasound-guided thyroid aspiration biopsy , dominant right nodule      09/20/2015 Pathology Results    THYROID, RIGHT, FINE NEEDLE ASPIRATION (SPECIMEN 1 OF 1 COLLECTED 09/20/15): FINDINGS CONSISTENT WITH BENIGN THYROID NODULE (BETHESDA CATEGORY II).      10/28/2015 Tumor Marker    Patient's tumor was tested for the following markers: CA125 Results of the tumor marker test revealed 53.2      11/04/2015 Imaging    Stable benign right thyroid nodule      12/16/2015 Tumor Marker    Patient's tumor was tested for the following markers: CA125 Results of the tumor  marker test revealed 38.1      03/22/2016 PET scan    Interval progression of hypermetabolic right paratracheal lymph node consistent with metastatic involvement. 2. Stable hypermetabolic right thyroid nodule. Reportedly this has been biopsied in the past.      03/23/2016 Tumor Marker    Patient's tumor was tested for the following markers:  CA125 Results of the tumor marker test revealed 62.4      04/13/2016 - 06/01/2016 Radiation Therapy    She received 56 Gy to the chest in 28 fractions       05/09/2016 Imaging    No evidence of left lower extremity deep vein thrombosis. No evidence of a superficial thrombosis of the greater and lesser saphenous veins. Positive for thrombus noted in several varicosities of the calf. No evidence of Baker's cyst on the left.      05/17/2016 Tumor Marker    Patient's tumor was tested for the following markers: CA125 Results of the tumor marker test revealed 9      06/29/2016 Tumor Marker    Patient's tumor was tested for the following markers: CA125 Results of the tumor marker test revealed 5.9      Since her recent radiation treatment, she has been feeling well.  Esophagitis has resolved.  She continues to have mild bilateral lower extremity swelling on and off.  She use her elastic compression hose sporadically. She takes aspirin on a regular basis. She denies recent bone pain. No recent nausea, vomiting, changes in bowel habits or appetite. 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 She has appointment due to see her gynecologist for follow-up. She denies recent pelvic discomfort no abnormal discharge  MEDICAL HISTORY:  Past Medical History:  Diagnosis Date  . Endometrial ca ([place])    iA high grade papillary serous endometrial carcinoma  . History of radiation therapy 04/13/16-06/01/16   chest 56 Gy in 28 fractions  . Hx of radiation therapy 5/27, 6/5, 6/11, 6/19, 11/27/2012   proximal vagina 30 Gy in 5 fx, HDR  . Hyperlipidemia   . Hypertension   . Radiation 04/05/15 - 05/13/15   retroperitoneal nodes 50.4 gray boosted to 56 gray    SURGICAL HISTORY: Past Surgical History:  Procedure Laterality Date  . ABDOMINAL HYSTERECTOMY     robotic hysterectomy with BSO and bilateral pelvic and periaortic node dissection  . CESAREAN SECTION    . CESAREAN SECTION     . COLONOSCOPY  2008    SOCIAL HISTORY: Social History   Social History  . Marital status: Married    Spouse name: N/A  . Number of children: N/A  . Years of education: N/A   Occupational History  . Nurse    Social History Main Topics  . Smoking status: Former Smoker    Quit date: 06/05/1982  . Smokeless tobacco: Never Used     Comment: approximate quit date  . Alcohol use Yes     Comment: occasional  . Drug use: No  . Sexual activity: Not on file   Other Topics Concern  . Not on file   Social History Narrative  . No narrative on file    FAMILY HISTORY: Family History  Problem Relation Age of Onset  . Dementia Mother   . Thyroid disease Mother   . Heart disease Father   . Renal Disease Father   . Cancer Brother     melanoma involving eye  . Polycystic ovary syndrome Daughter  ALLERGIES:  has No Known Allergies.  MEDICATIONS:  Current Outpatient Prescriptions  Medication Sig Dispense Refill  . Calcium Carb-Cholecalciferol 500-600 MG-UNIT TABS Take 1 tablet by mouth every morning.     . Cholecalciferol (VITAMIN D3) 2000 units TABS Take 2,000 Units by mouth every other day.     . megestrol (MEGACE) 40 MG tablet Take 2 tablets (80 mg total) by mouth 2 (two) times daily. X 3 weeks alternating with tamoxifen (Patient taking differently: Take 80 mg by mouth 2 (two) times daily. X 3 weeks alternating with tamoxifen) 84 tablet 3  . simvastatin (ZOCOR) 40 MG tablet Take 40 mg by mouth every evening.    . sucralfate (CARAFATE) 1 g tablet Dissolve 1 tablet in water and use 4 times a day before meals and at bedtime for esophagitis (Patient not taking: Reported on 07/06/2016) 120 tablet 0  . tamoxifen (NOLVADEX) 20 MG tablet Take 1 tablet (20 mg total) by mouth 2 (two) times daily. X 3 weeks alternating with Megace. 42 tablet 3  . venlafaxine XR (EFFEXOR-XR) 75 MG 24 hr capsule Take 75 mg by mouth daily with breakfast.      No current facility-administered medications for  this visit.     REVIEW OF SYSTEMS:   Constitutional: Denies fevers, chills or abnormal night sweats Eyes: Denies blurriness of vision, double vision or watery eyes Ears, nose, mouth, throat, and face: Denies mucositis or sore throat Respiratory: Denies cough, dyspnea or wheezes Cardiovascular: Denies palpitation, chest discomfort  Gastrointestinal:  Denies nausea, heartburn or change in bowel habits Skin: Denies abnormal skin rashes Lymphatics: Denies new lymphadenopathy or easy bruising Neurological:Denies numbness, tingling or new weaknesses Behavioral/Psych: Mood is stable, no new changes  All other systems were reviewed with the patient and are negative.  PHYSICAL EXAMINATION: ECOG PERFORMANCE STATUS: 1 - Symptomatic but completely ambulatory  Vitals:   08/04/16 1321  BP: (!) 158/82  Pulse: 78  Resp: 18  Temp: 98.1 F (36.7 C)   Filed Weights   08/04/16 1321  Weight: 164 lb 4.8 oz (74.5 kg)    GENERAL:alert, no distress and comfortable SKIN: skin color, texture, turgor are normal, no rashes or significant lesions EYES: normal, conjunctiva are pink and non-injected, sclera clear OROPHARYNX:no exudate, normal lips, buccal mucosa, and tongue  NECK: supple, thyroid normal size, non-tender, without nodularity LYMPH:  no palpable lymphadenopathy in the cervical, axillary or inguinal LUNGS: clear to auscultation and percussion with normal breathing effort HEART: regular rate & rhythm and no murmurs with minimum, trace edema.  Noted varicose veins ABDOMEN:abdomen soft, non-tender and normal bowel sounds Musculoskeletal:no cyanosis of digits and no clubbing  PSYCH: alert & oriented x 3 with fluent speech NEURO: no focal motor/sensory deficits  LABORATORY DATA:  I have reviewed the data as listed Lab Results  Component Value Date   WBC 4.9 08/04/2016   HGB 12.5 08/04/2016   HCT 36.7 08/04/2016   MCV 98.3 08/04/2016   PLT 204 08/04/2016    Recent Labs  05/17/16 0914  06/29/16 0955 08/04/16 1303  NA 142 142 140  K 3.8 3.8 3.6  CO2 _0 GLUCOSE 82 87 89  BUN 15.2 16.3 20.0  CREATININE 0.8 0.8 0.8  CALCIUM 9.1 9.6 9.6  PROT 6.7 6.8 7.1  ALBUMIN 3.5 3.9 3.9  AST _1 ALT _2 ALKPHOS 38* 52 44  BILITOT 0.47 0.50 0.36    ASSESSMENT & PLAN:  Endometrial cancer ([place]) The patient have  a long-standing history of endometrial cancer for the past 4 years. In the past year, she had recurrent disease, managed with anti-estrogen therapy and palliative radiation treatment. I think it is important to repeat imaging study soon for objective response to treatment. Her recent tumor markers appeared to have responded to recent palliative radiation treatment. She feels well with no clinical signs of active disease I will continue to my marker monitoring I plan to see her again next month of the PET CT scan result is available.  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 She had no residual esophagitis from recent radiation treatment. As above, plan to repeat imaging study for objective response to treatment  Abnormal thyroid scan She had recent multiple evaluation for abnormal thyroid nodule noted on PET CT scan that came back benign. I am not concerned about this and recommend observation only.  Thrombophlebitis of superficial veins of left lower extremity She had significant bilateral varicose veins on lower extremities with recent superficial thrombophlebitis. She is at high risk of DVT given treatment with tamoxifen and Megace. I recommend aspirin therapy and for her to use elastic compression hose on a regular basis   Orders Placed This Encounter  Procedures  . Comprehensive metabolic panel    Standing Status:   Standing    Number of Occurrences:   9     Standing Expiration Date:   08/04/2017  . CBC with Differential/Platelet    Standing Status:   Standing    Number of Occurrences:   9    Standing Expiration Date:   08/04/2017  . CA 125    Standing Status:   Standing    Number of Occurrences:   9    Standing Expiration Date:   08/04/2017    All questions were answered. The patient knows to call the clinic with any problems, questions or concerns. I spent 40 minutes counseling the patient face to face. The total time spent in the appointment was 60 minutes and more than 50% was on counseling.     [name], MD 08/04/2016 2:48 PM

## 2016-08-04 NOTE — Assessment & Plan Note (Addendum)
The patient have a long-standing history of endometrial cancer for the past 4 years. In the past year, she had recurrent disease, managed with anti-estrogen therapy and palliative radiation treatment. I think it is important to repeat imaging study soon for objective response to treatment. Her recent tumor markers appeared to have responded to recent palliative radiation treatment. She feels well with no clinical signs of active disease I will continue to my marker monitoring I plan to see her again next month of the PET CT scan result is available.

## 2016-08-05 LAB — CA 125: Cancer Antigen (CA) 125: 6 U/mL (ref 0.0–38.1)

## 2016-08-11 MED FILL — MEGESTROL 40 MG TABLET: 40 | 21 days supply | Qty: 84 | Fill #2

## 2016-08-11 MED FILL — TAMOXIFEN 20 MG TABLET: 20 | 21 days supply | Qty: 42 | Fill #3

## 2016-09-12 ENCOUNTER — Ambulatory Visit (HOSPITAL_COMMUNITY)
Admission: RE | Admit: 2016-09-12 | Discharge: 2016-09-12 | Disposition: A | Discharge status: Home / Self Care | Payer: 59 | Source: Ambulatory Visit | Attending: Hematology and Oncology | Admitting: Hematology and Oncology

## 2016-09-12 DIAGNOSIS — C541 Malignant neoplasm of endometrium: Secondary | ICD-10-CM | POA: Diagnosis not present

## 2016-09-12 DIAGNOSIS — E041 Nontoxic single thyroid nodule: Secondary | ICD-10-CM | POA: Diagnosis not present

## 2016-09-12 DIAGNOSIS — C771 Secondary and unspecified malignant neoplasm of intrathoracic lymph nodes: Secondary | ICD-10-CM | POA: Diagnosis not present

## 2016-09-12 LAB — GLUCOSE, CAPILLARY: Glucose-Capillary: 72 mg/dL (ref 65–99)

## 2016-09-12 MED ORDER — FLUDEOXYGLUCOSE F - 18 (FDG) INJECTION
7.9400 | Freq: Once | INTRAVENOUS | Status: AC | PRN
  Administered 2016-09-12: 7.94 via INTRAVENOUS

## 2016-09-14 ENCOUNTER — Telehealth: Payer: Self-pay | Admitting: Hematology and Oncology

## 2016-09-14 ENCOUNTER — Other Ambulatory Visit (HOSPITAL_BASED_OUTPATIENT_CLINIC_OR_DEPARTMENT_OTHER): Payer: 59

## 2016-09-14 ENCOUNTER — Encounter: Payer: Self-pay | Admitting: Hematology and Oncology

## 2016-09-14 ENCOUNTER — Ambulatory Visit (HOSPITAL_BASED_OUTPATIENT_CLINIC_OR_DEPARTMENT_OTHER): Payer: 59 | Admitting: Hematology and Oncology

## 2016-09-14 DIAGNOSIS — C541 Malignant neoplasm of endometrium: Secondary | ICD-10-CM

## 2016-09-14 DIAGNOSIS — R946 Abnormal results of thyroid function studies: Secondary | ICD-10-CM | POA: Diagnosis not present

## 2016-09-14 DIAGNOSIS — C772 Secondary and unspecified malignant neoplasm of intra-abdominal lymph nodes: Secondary | ICD-10-CM

## 2016-09-14 LAB — COMPREHENSIVE METABOLIC PANEL
ALT: 14 U/L (ref 0–55)
AST: 16 U/L (ref 5–34)
Albumin: 3.7 g/dL (ref 3.5–5.0)
Alkaline Phosphatase: 41 U/L (ref 40–150)
Anion Gap: 10 mEq/L (ref 3–11)
BUN: 15 mg/dL (ref 7.0–26.0)
CO2: 21 mEq/L — ABNORMAL LOW (ref 22–29)
Calcium: 9.4 mg/dL (ref 8.4–10.4)
Chloride: 112 mEq/L — ABNORMAL HIGH (ref 98–109)
Creatinine: 0.8 mg/dL (ref 0.6–1.1)
EGFR: 75 mL/min/{1.73_m2} — ABNORMAL LOW (ref >90)
Glucose: 84 mg/dl (ref 70–140)
Potassium: 3.6 mEq/L (ref 3.5–5.1)
Sodium: 142 mEq/L (ref 136–145)
Total Bilirubin: 0.46 mg/dL (ref 0.20–1.20)
Total Protein: 6.7 g/dL (ref 6.4–8.3)

## 2016-09-14 LAB — CBC WITH DIFFERENTIAL/PLATELET
BASO%: 1.4 % (ref 0.0–2.0)
Basophils Absolute: 0.1 10*3/uL (ref 0.0–0.1)
EOS%: 5.2 % (ref 0.0–7.0)
Eosinophils Absolute: 0.2 10*3/uL (ref 0.0–0.5)
HCT: 36 % (ref 34.8–46.6)
HGB: 12.3 g/dL (ref 11.6–15.9)
LYMPH%: 20.6 % (ref 14.0–49.7)
MCH: 32.7 pg (ref 25.1–34.0)
MCHC: 34.2 g/dL (ref 31.5–36.0)
MCV: 95.7 fL (ref 79.5–101.0)
MONO#: 0.2 10*3/uL (ref 0.1–0.9)
MONO%: 5.8 % (ref 0.0–14.0)
NEUT#: 2.4 10*3/uL (ref 1.5–6.5)
NEUT%: 67 % (ref 38.4–76.8)
Platelets: 171 10*3/uL (ref 145–400)
RBC: 3.76 10*6/uL (ref 3.70–5.45)
RDW: 12.6 % (ref 11.2–14.5)
WBC: 3.6 10*3/uL — ABNORMAL LOW (ref 3.9–10.3)
lymph#: 0.8 10*3/uL — ABNORMAL LOW (ref 0.9–3.3)

## 2016-09-14 MED ORDER — TAMOXIFEN CITRATE 20 MG PO TABS: 20.0000 mg | ORAL_TABLET | Freq: Two times a day (BID) | ORAL | 6 refills | Status: DC

## 2016-09-14 MED ORDER — MEGESTROL ACETATE 40 MG PO TABS: 80.0000 mg | ORAL_TABLET | Freq: Two times a day (BID) | ORAL | 6 refills | Status: DC

## 2016-09-14 MED FILL — VENLAFAXINE HCL ER 75 MG CA: 75 | 30 days supply | Qty: 90 | Fill #2

## 2016-09-14 MED FILL — TAMOXIFEN 20 MG TABLET: 20 | 21 days supply | Qty: 42 | Fill #0

## 2016-09-14 MED FILL — SIMVASTATIN 40 MG TABLET: 40 | 90 days supply | Qty: 90 | Fill #2

## 2016-09-14 MED FILL — MEGESTROL 40 MG TABLET: 40 | 21 days supply | Qty: 84 | Fill #0

## 2016-09-14 NOTE — Assessment & Plan Note (Addendum)
The patient have a long-standing history of endometrial cancer for the past 4 years. In the past year, she had recurrent disease, managed with anti-estrogen therapy and palliative radiation treatment. Her recent tumor markers appeared to have responded to recent palliative radiation treatment. She feels well with no clinical signs of active disease I will continue to tumor marker monitoring PET CT scan from 09/12/2016 show no evidence of active disease with complete metabolic response. I do not intend to repeat imaging study again for at least another 6 months to a year, unless the patient show signs or symptoms of disease recurrence. I will see her back again in 3 month The patient desires to travel soon. With Megace and tamoxifen, she is at risk of blood clot I recommend increase aspirin to 81 mg twice a day, frequent ambulation, elastic compression hose and plenty of oral fluid

## 2016-09-14 NOTE — Progress Notes (Signed)
[facility] OFFICE PROGRESS NOTE  Patient Care Team: [name], MD as PCP - General (Family Medicine)  SUMMARY OF ONCOLOGIC HISTORY:   Endometrial cancer [facility])   06/20/2012 Pathology Results    Biopsy positive for papillary serous carcinoma      06/20/2012 Genetic Testing    [employer] testing done 11-2015 on surgical path from 2014: MS stable TMB low 4 muts/mb ATM [id number] ERBB3 T389K E2H2 rearrangement exon 9 PPP2R1A P179R TP53 [id number]       06/20/2012 Initial Diagnosis    Patient presented to PCP with intermittent vaginal bleeding since ~ Oct 2013, endometrial biopsy 06-20-12 with complex endometrial hyperplasia with atypia      06/26/2012 Imaging    Thickened endometrial lining in a postmenopausal patient experiencing vaginal bleeding. In the setting of post-menopausal bleeding, endometrial sampling is indicated to exclude carcinoma. No focal myometrial abnormalities are seen.  Normal left ovary and non-visualized right ovary      07/30/2012 Surgery    Dr. Polly Cobia performed robotic hysterectomy with bilateral salpingo-oophorectomy, bilateral pelvic lymph node dissection and periaortic lymph node dissection. Intraoperatively on frozen section, the patient was noted to have a large endometrial polyp with changes within the polyp consistent for high-grade malignancy, possibly papillary serous carcinoma. There is no obvious extrauterine disease noted.        07/30/2012 Pathology Results    [id number] SUPPLEMENTAL REPORT  THE ENDOMETRIAL CARCINOMA WAS ANALYZED FOR DNA MISMATCH REPAIR PROTEINS.IMMUNOHISTOCHEMICALLY, THE NEOPLASM RETAINED NUCLEAR EXPRESSION OF 4 GENE PRODUCTS, MLH1, MSH2, MSH6, AND PMS2, INVOLVED IN DNA MISMATCH REPAIR.POSITIVE AND NEGATIVE CONTROLS WORKED APPROPRIATELY.  PER REQUEST, AN ER AND PR ARE PERFORMED ON BLOCK 1I.  ER- APPROXIMATELY 25-35% STAINING IN NEOPLASTIC CELLS (INTERMEDIATE)  PR- APPROXIMATELY 25-35% STAINING  IN NEOPLASTIC CELLS (STRONG)  ER AND PR PREDOMINANTLY SHOW STAINING IN THE SEROUS COMPONENT.  DIAGNOSIS  1. UTERUS, CERVIX WITH BILATERAL FALLOPIAN TUBES AND OVARIES,  HYSTERECTOMY AND BILATERAL SALPINGO-OOPHORECTOMY:  HIGH GRADE/POORLY DIFFERENTIATED ENDOMETRIAL ADENOCARCINOMA WITH SOLID AND SEROUS PAPILLARY COMPONENTS.  HISTOPATHOLOGIC TYPE:A VARIETY OF PATTERNS WERE PRESENT, ALL OF WHICH SHOULD BE CONSIDERED TO BE HIGH GRADE.SEROUS PAPILLARY CARCINOMA WAS SEEN OCCURRING ADJACENT TO SOLID ENDOMETRIAL CARCINOMA WITH MARKED ANAPLASIA AND GIANT CELLS. SIZE:TUMOR MEASURED AT LEAST 4.8 CM IN GREATEST HORIZONTAL DIMENSION.  GRADE: POORLY DIFFERENTIATED OR HIGH GRADE. DEPTH OF INVASION: NO DEFINITE MYOMETRIAL INVOLVEMENT WAS SEEN.THE TUMOR APPEARED CONFINED TO THE POLYP AS WELL AS SURFACE ENDOMETRIUM. WHERE MYOMETRIAL THICKNESS NOT APPLICABLE. SEROSAL INVOLVEMENT: NOT DEMONSTRATED.  ENDOCERVICAL INVOLVEMENT:NOT DEMONSTRATED. RESECTION MARGINS: FREE OF INVOLVEMENT. EXTRAUTERINE EXTENSION:NOT DEMONSTRATED. ANGIOLYMPHATIC INVASION: NOT DEFINITELY SEEN. TOTAL NODES EXAMINED:22.  PELVIC NODES EXAMINED:20.  PELVIC NODES INVOLVED:0.  PARA-AORTIC NODES 2. EXAMINED:  PARA-AORTIC NODES 0. INVOLVED TNM STAGE: T1A N0 MX. AJCC STAGE GROUPING: IA. FIGO STAGE:IA.      08/26/2012 Imaging    No CT evidence for intra-abdominal or pelvic metastatic disease. Trace free pelvic fluid, presumably postoperative although the date of surgery is not documented in the electronic medical record.      08/26/2012 - 12/13/2012 Chemotherapy    She received 6 cycles of carbo/taxol       10/29/2012 - 11/27/2012 Radiation Therapy    May 27, June 5,  June 11, June 19, November 27, 2012: Proximal vagina 30 Gy in 5 fractions        01/17/2013 Imaging    1.  No evidence of recurrent or metastatic disease. 2.  No acute abnormality  involving the abdomen or pelvis. 3.  Mild  diffuse hepatic steatosis. 4.  Very small supraumbilical midline anterior abdominal wall hernia containing fat, unchanged      01/22/2014 Imaging    No evidence for metastatic or recurrent disease. 2. No bowel obstruction.  Normal appendix. 3. Small fat containing hernia, stable in appearance. 4. Status post hysterectomy and bilateral oophorectomy      02/19/2014 Imaging    No pulmonary lesions are identified. 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 No other sites of metastatic disease identified within the abdomen or pelvis.        11/27/2014 - 02/11/2015 Chemotherapy    She received 4 cycles of carbo/taxol       03/01/2015 PET scan    Single hypermetabolic small retroperitoneal lymph node along the aorta. 2. No evidence of metastatic disease otherwise in the abdomen or pelvis. No evidence local recurrence. 3. Intensely hypermetabolic enlarged nodule adjacent to the RIGHT lobe of thyroid gland. This presumably represents the biopsied lesion in clinician report which was found to be benign thyroid tissue.      04/05/2015 - 05/13/2015 Radiation Therapy    She received 50.4 gray in 28 fractions with simultaneous integrated boost to 56 gray      06/17/2015 Imaging    No acute process or evidence of metastatic disease in the abdomen or pelvis. Resolution of previously described retroperitoneal adenopathy. 2.  Possible constipation. 3. Atherosclerosis.      06/17/2015 Tumor Marker    Patient's tumor was tested for the following markers: CA125 Results of the tumor marker test revealed 33      08/12/2015 Tumor Marker    Patient's tumor was tested for the following markers: CA125 Results of the tumor marker test  revealed 52      08/31/2015 PET scan    Development of right paratracheal hypermetabolic adenopathy, consistent with nodal metastasis. 2. The previously described isolated abdominal retroperitoneal hypermetabolic node has resolved. 3. Persistent hypermetabolic right thyroid nodule, per report previously biopsied. Correlate with those results. 4.  Possible constipation.      09/09/2015 -  Anti-estrogen oral therapy    She has been receiving alternative treatment between megace and tamoxifen      09/09/2015 Tumor Marker    Patient's tumor was tested for the following markers: CA125 Results of the tumor marker test revealed 37.8      09/20/2015 Procedure    Technically successful ultrasound-guided thyroid aspiration biopsy , dominant right nodule      09/20/2015 Pathology Results    THYROID, RIGHT, FINE NEEDLE ASPIRATION (SPECIMEN 1 OF 1 COLLECTED 09/20/15): FINDINGS CONSISTENT WITH BENIGN THYROID NODULE (BETHESDA CATEGORY II).      10/28/2015 Tumor Marker    Patient's tumor was tested for the following markers: CA125 Results of the tumor marker test revealed 53.2      11/04/2015 Imaging    Stable benign right thyroid nodule      12/16/2015 Tumor Marker    Patient's tumor was tested for the following markers: CA125 Results of the tumor marker test revealed 38.1      03/22/2016 PET scan    Interval progression of hypermetabolic right paratracheal lymph node consistent with metastatic involvement. 2. Stable hypermetabolic right thyroid nodule. Reportedly this has been biopsied in the past.      03/23/2016 Tumor  Marker    Patient's tumor was tested for the following markers: CA125 Results of the tumor marker test revealed 62.4      04/13/2016 - 06/01/2016 Radiation Therapy    She received 56 Gy to the chest in 28 fractions       05/09/2016 Imaging    No evidence of left lower extremity deep vein thrombosis. No evidence of a superficial thrombosis of the greater and lesser saphenous  veins. Positive for thrombus noted in several varicosities of the calf. No evidence of Baker's cyst on the left.      05/17/2016 Tumor Marker    Patient's tumor was tested for the following markers: CA125 Results of the tumor marker test revealed 9      06/29/2016 Tumor Marker    Patient's tumor was tested for the following markers: CA125 Results of the tumor marker test revealed 5.9      08/04/2016 Tumor Marker    Patient's tumor was tested for the following markers: CA125 Results of the tumor marker test revealed 6.0      09/12/2016 PET scan    Complete metabolic response to therapy, with resolution of hypermetabolic mediastinal lymphadenopathy since prior exam. No residual or new metastatic disease identified. Stable hypermetabolic right thyroid lobe nodule, which was previously biopsied on 09/20/2015.       INTERVAL HISTORY: Please see below for problem oriented charting. She returns for further follow-up. She feels fine. Denies chest pain or shortness of breath. No recent cough. She tolerated tamoxifen alternate with Megace well. She started taking aspirin therapy  REVIEW OF SYSTEMS:   Constitutional: Denies fevers, chills or abnormal weight loss Eyes: Denies blurriness of vision Ears, nose, mouth, throat, and face: Denies mucositis or sore throat Respiratory: Denies cough, dyspnea or wheezes Cardiovascular: Denies palpitation, chest discomfort or lower extremity swelling Gastrointestinal:  Denies nausea, heartburn or change in bowel habits Skin: Denies abnormal skin rashes Lymphatics: Denies new lymphadenopathy or easy bruising Neurological:Denies numbness, tingling or new weaknesses Behavioral/Psych: Mood is stable, no new changes  All other systems were reviewed with the patient and are negative.  I have reviewed the past medical history, past surgical history, social history and family history with the patient and they are unchanged from previous note.  ALLERGIES:   has No Known Allergies.  MEDICATIONS:  Current Outpatient Prescriptions  Medication Sig Dispense Refill  . aspirin EC 81 MG tablet Take 81 mg by mouth daily.    . Calcium Carb-Cholecalciferol 500-600 MG-UNIT TABS Take 1 tablet by mouth every morning.     . Cholecalciferol (VITAMIN D3) 2000 units TABS Take 2,000 Units by mouth every other day.     . megestrol (MEGACE) 40 MG tablet Take 2 tablets (80 mg total) by mouth 2 (two) times daily. X 3 weeks alternating with tamoxifen 84 tablet 6  . simvastatin (ZOCOR) 40 MG tablet Take 40 mg by mouth every evening.    . tamoxifen (NOLVADEX) 20 MG tablet Take 1 tablet (20 mg total) by mouth 2 (two) times daily. X 3 weeks alternating with Megace. 42 tablet 6  . venlafaxine XR (EFFEXOR-XR) 75 MG 24 hr capsule Take 75 mg by mouth daily with breakfast.     . sucralfate (CARAFATE) 1 g tablet Dissolve 1 tablet in water and use 4 times a day before meals and at bedtime for esophagitis (Patient not taking: Reported on 07/06/2016) 120 tablet 0   No current facility-administered medications for this visit.     PHYSICAL EXAMINATION: ECOG  PERFORMANCE STATUS: 0 - Asymptomatic  Vitals:   09/14/16 0925  BP: (!) 147/78  Pulse: 75  Resp: 18  Temp: 98.5 F (36.9 C)   Filed Weights   09/14/16 0925  Weight: 165 lb (74.8 kg)    GENERAL:alert, no distress and comfortable SKIN: skin color, texture, turgor are normal, no rashes or significant lesions EYES: normal, Conjunctiva are pink and non-injected, sclera clear Musculoskeletal:no cyanosis of digits and no clubbing  NEURO: alert & oriented x 3 with fluent speech, no focal motor/sensory deficits  LABORATORY DATA:  I have reviewed the data as listed    Component Value Date/Time   NA 140 08/04/2016 1303   K 3.6 08/04/2016 1303   CL 102 01/22/2014 0853   CL 105 11/19/2012 0848   CO2 24 08/04/2016 1303   GLUCOSE 89 08/04/2016 1303   GLUCOSE 122 (H) 11/19/2012 0848   BUN 20.0 08/04/2016 1303   CREATININE  0.8 08/04/2016 1303   CALCIUM 9.6 08/04/2016 1303   PROT 7.1 08/04/2016 1303   ALBUMIN 3.9 08/04/2016 1303   AST 17 08/04/2016 1303   ALT 12 08/04/2016 1303   ALKPHOS 44 08/04/2016 1303   BILITOT 0.36 08/04/2016 1303   GFRNONAA >90 01/22/2014 0853   GFRAA >90 01/22/2014 0853    No results found for: SPEP, UPEP  Lab Results  Component Value Date   WBC 3.6 (L) 09/14/2016   NEUTROABS 2.4 09/14/2016   HGB 12.3 09/14/2016   HCT 36.0 09/14/2016   MCV 95.7 09/14/2016   PLT 171 09/14/2016      Chemistry      Component Value Date/Time   NA 140 08/04/2016 1303   K 3.6 08/04/2016 1303   CL 102 01/22/2014 0853   CL 105 11/19/2012 0848   CO2 24 08/04/2016 1303   BUN 20.0 08/04/2016 1303   CREATININE 0.8 08/04/2016 1303   GLU 158 (H) 01/14/2015 1035      Component Value Date/Time   CALCIUM 9.6 08/04/2016 1303   ALKPHOS 44 08/04/2016 1303   AST 17 08/04/2016 1303   ALT 12 08/04/2016 1303   BILITOT 0.36 08/04/2016 1303       RADIOGRAPHIC STUDIES: I reviewed multiple imaging studies with the patient I have personally reviewed the radiological images as listed and agreed with the findings in the report. Nm Pet Image Restag (ps) Skull Base To Thigh  Result Date: 09/12/2016 CLINICAL DATA:  Subsequent treatment strategy for metastatic endometrial carcinoma. EXAM: NUCLEAR MEDICINE PET SKULL BASE TO THIGH TECHNIQUE: 7.9 mCi F-18 FDG was injected intravenously. Full-ring PET imaging was performed from the skull base to thigh after the radiotracer. CT data was obtained and used for attenuation correction and anatomic localization. FASTING BLOOD GLUCOSE:  Value: 78 mg/dl COMPARISON:  03/22/2016 FINDINGS: NECK No hypermetabolic lymph nodes in the neck. Hypermetabolic 2.8 cm right thyroid lobe nodule is stable in size. This has SUV max of 9.3 compared to 13.1 previously. CHEST There has been resolution of previously seen hypermetabolic mediastinal lymphadenopathy in the right paratracheal  region since prior exam. No residual hypermetabolic lymphadenopathy seen within the thorax . No suspicious pulmonary nodules on the CT scan. Right lung paramediastinal radiation changes noted. ABDOMEN/PELVIS No abnormal hypermetabolic activity within the liver, pancreas, adrenal glands, or spleen. No hypermetabolic lymph nodes in the abdomen or pelvis. Prior hysterectomy again noted. SKELETON No focal hypermetabolic activity to suggest skeletal metastasis. IMPRESSION: Complete metabolic response to therapy, with resolution of hypermetabolic mediastinal lymphadenopathy since prior exam. No residual or  new metastatic disease identified. Stable hypermetabolic right thyroid lobe nodule, which was previously biopsied on 09/20/2015. Electronically Signed   By: Earle Gell M.D.   On: 09/12/2016 10:10    ASSESSMENT & PLAN:  Endometrial cancer ([place]) The patient have a long-standing history of endometrial cancer for the past 4 years. In the past year, she had recurrent disease, managed with anti-estrogen therapy and palliative radiation treatment. Her recent tumor markers appeared to have responded to recent palliative radiation treatment. She feels well with no clinical signs of active disease I will continue to tumor marker monitoring PET CT scan from 09/12/2016 show no evidence of active disease with complete metabolic response. I do not intend to repeat imaging study again for at least another 6 months to a year, unless the patient show signs or symptoms of disease recurrence. I will see her back again in 3 month The patient desires to travel soon. With Megace and tamoxifen, she is at risk of blood clot I recommend increase aspirin to 81 mg twice a day, frequent ambulation, elastic compression hose and plenty of oral fluid  Abnormal thyroid scan She had recent multiple evaluation for abnormal thyroid nodule noted on PET CT scan that came back benign. I am not concerned about this and recommend  observation only.   No orders of the defined types were placed in this encounter.  All questions were answered. The patient knows to call the clinic with any problems, questions or concerns. No barriers to learning was detected. I spent 15 minutes counseling the patient face to face. The total time spent in the appointment was 20 minutes and more than 50% was on counseling and review of test results     [name], MD 09/14/2016 9:49 AM

## 2016-09-14 NOTE — Telephone Encounter (Signed)
Appointments scheduled per 4.12.18 LOS. Patient notified.

## 2016-09-14 NOTE — Assessment & Plan Note (Signed)
She had recent multiple evaluation for abnormal thyroid nodule noted on PET CT scan that came back benign. I am not concerned about this and recommend observation only.

## 2016-09-15 LAB — CA 125: Cancer Antigen (CA) 125: 6.2 U/mL (ref 0.0–38.1)

## 2016-11-10 MED FILL — MEGESTROL 40 MG TABLET: 40 | 21 days supply | Qty: 84 | Fill #1

## 2016-11-10 MED FILL — TAMOXIFEN 20 MG TABLET: 20 | 21 days supply | Qty: 42 | Fill #1

## 2016-11-16 DIAGNOSIS — B078 Other viral warts: Secondary | ICD-10-CM | POA: Diagnosis not present

## 2016-11-16 DIAGNOSIS — L309 Dermatitis, unspecified: Secondary | ICD-10-CM | POA: Diagnosis not present

## 2016-11-16 MED FILL — FLUOROURACIL 5% CREAM: 5 | 20 days supply | Qty: 40 | Fill #0

## 2016-11-16 MED FILL — TACROLIMUS 0.1% OINTMENT: 0.1 | 20 days supply | Qty: 30 | Fill #0

## 2016-12-14 ENCOUNTER — Other Ambulatory Visit (HOSPITAL_BASED_OUTPATIENT_CLINIC_OR_DEPARTMENT_OTHER): Payer: 59

## 2016-12-14 ENCOUNTER — Ambulatory Visit (HOSPITAL_BASED_OUTPATIENT_CLINIC_OR_DEPARTMENT_OTHER): Payer: 59 | Admitting: Hematology and Oncology

## 2016-12-14 ENCOUNTER — Telehealth: Payer: Self-pay | Admitting: Hematology and Oncology

## 2016-12-14 VITALS — BP 151/83 | HR 80 | Temp 98.8°F | Resp 20 | Ht 66.0 in | Wt 160.3 lb

## 2016-12-14 DIAGNOSIS — D701 Agranulocytosis secondary to cancer chemotherapy: Secondary | ICD-10-CM

## 2016-12-14 DIAGNOSIS — C772 Secondary and unspecified malignant neoplasm of intra-abdominal lymph nodes: Secondary | ICD-10-CM | POA: Diagnosis not present

## 2016-12-14 DIAGNOSIS — C541 Malignant neoplasm of endometrium: Secondary | ICD-10-CM | POA: Diagnosis not present

## 2016-12-14 DIAGNOSIS — T451X5A Adverse effect of antineoplastic and immunosuppressive drugs, initial encounter: Secondary | ICD-10-CM

## 2016-12-14 LAB — COMPREHENSIVE METABOLIC PANEL
ALT: 18 U/L (ref 0–55)
AST: 21 U/L (ref 5–34)
Albumin: 3.7 g/dL (ref 3.5–5.0)
Alkaline Phosphatase: 41 U/L (ref 40–150)
Anion Gap: 10 mEq/L (ref 3–11)
BUN: 14 mg/dL (ref 7.0–26.0)
CO2: 22 mEq/L (ref 22–29)
Calcium: 9.5 mg/dL (ref 8.4–10.4)
Chloride: 110 mEq/L — ABNORMAL HIGH (ref 98–109)
Creatinine: 0.8 mg/dL (ref 0.6–1.1)
EGFR: 75 mL/min/{1.73_m2} — ABNORMAL LOW (ref >90)
Glucose: 101 mg/dl (ref 70–140)
Potassium: 3.4 mEq/L — ABNORMAL LOW (ref 3.5–5.1)
Sodium: 142 mEq/L (ref 136–145)
Total Bilirubin: 0.47 mg/dL (ref 0.20–1.20)
Total Protein: 6.8 g/dL (ref 6.4–8.3)

## 2016-12-14 LAB — CBC WITH DIFFERENTIAL/PLATELET
BASO%: 1.4 % (ref 0.0–2.0)
Basophils Absolute: 0 10*3/uL (ref 0.0–0.1)
EOS%: 7.6 % — ABNORMAL HIGH (ref 0.0–7.0)
Eosinophils Absolute: 0.2 10*3/uL (ref 0.0–0.5)
HCT: 36.7 % (ref 34.8–46.6)
HGB: 12.4 g/dL (ref 11.6–15.9)
LYMPH%: 22.9 % (ref 14.0–49.7)
MCH: 33.7 pg (ref 25.1–34.0)
MCHC: 33.8 g/dL (ref 31.5–36.0)
MCV: 99.7 fL (ref 79.5–101.0)
MONO#: 0.1 10*3/uL (ref 0.1–0.9)
MONO%: 3.5 % (ref 0.0–14.0)
NEUT#: 1.9 10*3/uL (ref 1.5–6.5)
NEUT%: 64.6 % (ref 38.4–76.8)
Platelets: 154 10*3/uL (ref 145–400)
RBC: 3.68 10*6/uL — ABNORMAL LOW (ref 3.70–5.45)
RDW: 12.5 % (ref 11.2–14.5)
WBC: 2.9 10*3/uL — ABNORMAL LOW (ref 3.9–10.3)
lymph#: 0.7 10*3/uL — ABNORMAL LOW (ref 0.9–3.3)

## 2016-12-14 MED FILL — VENLAFAXINE HCL ER 75 MG CA: 75 | 30 days supply | Qty: 90 | Fill #3

## 2016-12-14 NOTE — Telephone Encounter (Signed)
Mailed appts for 10/15 + 10/16.

## 2016-12-15 ENCOUNTER — Encounter: Payer: Self-pay | Admitting: Hematology and Oncology

## 2016-12-15 ENCOUNTER — Telehealth: Payer: Self-pay | Admitting: *Deleted

## 2016-12-15 LAB — CA 125: Cancer Antigen (CA) 125: 6.3 U/mL (ref 0.0–38.1)

## 2016-12-15 NOTE — Telephone Encounter (Signed)
-----   Message from [name], MD sent at 12/15/2016  8:11 AM EDT ----- Regarding: CA 125 OK Please let her know lab is OK ----- Message ----- From: Interface, Lab In Three Zero One Sent: 12/14/2016   8:28 AM To: [name], MD

## 2016-12-15 NOTE — Telephone Encounter (Signed)
Notified of message below

## 2016-12-15 NOTE — Assessment & Plan Note (Signed)
This is likely due to recent treatment. The patient denies recent history of fevers, cough, chills, diarrhea or dysuria. She is asymptomatic from the leukopenia. I will observe for now.    

## 2016-12-15 NOTE — Progress Notes (Signed)
[place] OFFICE PROGRESS NOTE  Patient Care Team: [name], MD as PCP - General (Family Medicine)  SUMMARY OF ONCOLOGIC HISTORY:   Endometrial cancer [facility])   06/20/2012 Pathology Results    Biopsy positive for papillary serous carcinoma      06/20/2012 Genetic Testing    [employer] One testing done 11-2015 on surgical path from 2014: MS stable TMB low 4 muts/mb ATM [id number] ERBB3 T389K E2H2 rearrangement exon 9 PPP2R1A P179R TP53 [id number]       06/20/2012 Initial Diagnosis    Patient presented to PCP with intermittent vaginal bleeding since ~ Oct 2013, endometrial biopsy 06-20-12 with complex endometrial hyperplasia with atypia      06/26/2012 Imaging    Thickened endometrial lining in a postmenopausal patient experiencing vaginal bleeding. In the setting of post-menopausal bleeding, endometrial sampling is indicated to exclude carcinoma. No focal myometrial abnormalities are seen.  Normal left ovary and non-visualized right ovary      07/30/2012 Surgery    Dr. Polly Cobia performed robotic hysterectomy with bilateral salpingo-oophorectomy, bilateral pelvic lymph node dissection and periaortic lymph node dissection. Intraoperatively on frozen section, the patient was noted to have a large endometrial polyp with changes within the polyp consistent for high-grade malignancy, possibly papillary serous carcinoma. There is no obvious extrauterine disease noted.        07/30/2012 Pathology Results    [phone] SUPPLEMENTAL REPORT  THE ENDOMETRIAL CARCINOMA WAS ANALYZED FOR DNA MISMATCH REPAIR PROTEINS.IMMUNOHISTOCHEMICALLY, THE NEOPLASM RETAINED NUCLEAR EXPRESSION OF 4 GENE PRODUCTS, MLH1, MSH2, MSH6, AND PMS2, INVOLVED IN DNA MISMATCH REPAIR.POSITIVE AND NEGATIVE CONTROLS WORKED APPROPRIATELY.  PER REQUEST, AN ER AND PR ARE PERFORMED ON BLOCK 1I.  ER- APPROXIMATELY 25-35% STAINING IN NEOPLASTIC CELLS (INTERMEDIATE)  PR- APPROXIMATELY 25-35%  STAINING IN NEOPLASTIC CELLS (STRONG)  ER AND PR PREDOMINANTLY SHOW STAINING IN THE SEROUS COMPONENT.  DIAGNOSIS  1. UTERUS, CERVIX WITH BILATERAL FALLOPIAN TUBES AND OVARIES,  HYSTERECTOMY AND BILATERAL SALPINGO-OOPHORECTOMY:  HIGH GRADE/POORLY DIFFERENTIATED ENDOMETRIAL ADENOCARCINOMA WITH SOLID AND SEROUS PAPILLARY COMPONENTS.  HISTOPATHOLOGIC TYPE:A VARIETY OF PATTERNS WERE PRESENT, ALL OF WHICH SHOULD BE CONSIDERED TO BE HIGH GRADE.SEROUS PAPILLARY CARCINOMA WAS SEEN OCCURRING ADJACENT TO SOLID ENDOMETRIAL CARCINOMA WITH MARKED ANAPLASIA AND GIANT CELLS. SIZE:TUMOR MEASURED AT LEAST 4.8 CM IN GREATEST HORIZONTAL DIMENSION.  GRADE: POORLY DIFFERENTIATED OR HIGH GRADE. DEPTH OF INVASION: NO DEFINITE MYOMETRIAL INVOLVEMENT WAS SEEN.THE TUMOR APPEARED CONFINED TO THE POLYP AS WELL AS SURFACE ENDOMETRIUM. WHERE MYOMETRIAL THICKNESS NOT APPLICABLE. SEROSAL INVOLVEMENT: NOT DEMONSTRATED.  ENDOCERVICAL INVOLVEMENT:NOT DEMONSTRATED. RESECTION MARGINS: FREE OF INVOLVEMENT. EXTRAUTERINE EXTENSION:NOT DEMONSTRATED. ANGIOLYMPHATIC INVASION: NOT DEFINITELY SEEN. TOTAL NODES EXAMINED:22.  PELVIC NODES EXAMINED:20.  PELVIC NODES INVOLVED:0.  PARA-AORTIC NODES 2. EXAMINED:  PARA-AORTIC NODES 0. INVOLVED TNM STAGE: T1A N0 MX. AJCC STAGE GROUPING: IA. FIGO STAGE:IA.      08/26/2012 Imaging    No CT evidence for intra-abdominal or pelvic metastatic disease. Trace free pelvic fluid, presumably postoperative although the date of surgery is not documented in the electronic medical record.      08/26/2012 - 12/13/2012 Chemotherapy    She received 6 cycles of carbo/taxol       10/29/2012 - 11/27/2012 Radiation Therapy    May 27, June 5,  June 11, June 19, November 27, 2012: Proximal vagina 30 Gy in 5 fractions        01/17/2013 Imaging    1.  No evidence of recurrent or metastatic disease. 2.  No acute  abnormality involving the abdomen or pelvis. 3.  Mild  diffuse hepatic steatosis. 4.  Very small supraumbilical midline anterior abdominal wall hernia containing fat, unchanged      01/22/2014 Imaging    No evidence for metastatic or recurrent disease. 2. No bowel obstruction.  Normal appendix. 3. Small fat containing hernia, stable in appearance. 4. Status post hysterectomy and bilateral oophorectomy      02/19/2014 Imaging    No pulmonary lesions are identified. 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 No other sites of metastatic disease identified within the abdomen or pelvis.        11/27/2014 - 02/11/2015 Chemotherapy    She received 4 cycles of carbo/taxol       03/01/2015 PET scan    Single hypermetabolic small retroperitoneal lymph node along the aorta. 2. No evidence of metastatic disease otherwise in the abdomen or pelvis. No evidence local recurrence. 3. Intensely hypermetabolic enlarged nodule adjacent to the RIGHT lobe of thyroid gland. This presumably represents the biopsied lesion in clinician report which was found to be benign thyroid tissue.      04/05/2015 - 05/13/2015 Radiation Therapy    She received 50.4 gray in 28 fractions with simultaneous integrated boost to 56 gray      06/17/2015 Imaging    No acute process or evidence of metastatic disease in the abdomen or pelvis. Resolution of previously described retroperitoneal adenopathy. 2.  Possible constipation. 3. Atherosclerosis.      06/17/2015 Tumor Marker    Patient's tumor was tested for the following markers: CA125 Results of the tumor marker test revealed 33      08/12/2015 Tumor Marker    Patient's tumor was tested for the following markers: CA125 Results of the tumor  marker test revealed 52      08/31/2015 PET scan    Development of right paratracheal hypermetabolic adenopathy, consistent with nodal metastasis. 2. The previously described isolated abdominal retroperitoneal hypermetabolic node has resolved. 3. Persistent hypermetabolic right thyroid nodule, per report previously biopsied. Correlate with those results. 4.  Possible constipation.      09/09/2015 -  Anti-estrogen oral therapy    She has been receiving alternative treatment between megace and tamoxifen      09/09/2015 Tumor Marker    Patient's tumor was tested for the following markers: CA125 Results of the tumor marker test revealed 37.8      09/20/2015 Procedure    Technically successful ultrasound-guided thyroid aspiration biopsy , dominant right nodule      09/20/2015 Pathology Results    THYROID, RIGHT, FINE NEEDLE ASPIRATION (SPECIMEN 1 OF 1 COLLECTED 09/20/15): FINDINGS CONSISTENT WITH BENIGN THYROID NODULE (BETHESDA CATEGORY II).      10/28/2015 Tumor Marker    Patient's tumor was tested for the following markers: CA125 Results of the tumor marker test revealed 53.2      11/04/2015 Imaging    Stable benign right thyroid nodule      12/16/2015 Tumor Marker    Patient's tumor was tested for the following markers: CA125 Results of the tumor marker test revealed 38.1      03/22/2016 PET scan    Interval progression of hypermetabolic right paratracheal lymph node consistent with metastatic involvement. 2. Stable hypermetabolic right thyroid nodule. Reportedly this has been biopsied in the past.      03/23/2016 Tumor   Marker    Patient's tumor was tested for the following markers: CA125 Results of the tumor marker test revealed 62.4      04/13/2016 - 06/01/2016 Radiation Therapy    She received 56 Gy to the chest in 28 fractions       05/09/2016 Imaging    No evidence of left lower extremity deep vein thrombosis. No evidence of a superficial thrombosis of the greater and  lesser saphenous veins. Positive for thrombus noted in several varicosities of the calf. No evidence of Baker's cyst on the left.      05/17/2016 Tumor Marker    Patient's tumor was tested for the following markers: CA125 Results of the tumor marker test revealed 9      06/29/2016 Tumor Marker    Patient's tumor was tested for the following markers: CA125 Results of the tumor marker test revealed 5.9      08/04/2016 Tumor Marker    Patient's tumor was tested for the following markers: CA125 Results of the tumor marker test revealed 6.0      09/12/2016 PET scan    Complete metabolic response to therapy, with resolution of hypermetabolic mediastinal lymphadenopathy since prior exam. No residual or new metastatic disease identified. Stable hypermetabolic right thyroid lobe nodule, which was previously biopsied on 09/20/2015.       INTERVAL HISTORY: Please see below for problem oriented charting. She returns for further follow-up She has recent intentional weight loss and is very active Denies nausea, abdominal bloating, constipation or changes in bowel habits She tolerated tamoxifen and Megace well She had recent good vacation.  REVIEW OF SYSTEMS:   Constitutional: Denies fevers, chills or abnormal weight loss Eyes: Denies blurriness of vision Ears, nose, mouth, throat, and face: Denies mucositis or sore throat Respiratory: Denies cough, dyspnea or wheezes Cardiovascular: Denies palpitation, chest discomfort or lower extremity swelling Gastrointestinal:  Denies nausea, heartburn or change in bowel habits Skin: Denies abnormal skin rashes Lymphatics: Denies new lymphadenopathy or easy bruising Neurological:Denies numbness, tingling or new weaknesses Behavioral/Psych: Mood is stable, no new changes  All other systems were reviewed with the patient and are negative.  I have reviewed the past medical history, past surgical history, social history and family history with the patient  and they are unchanged from previous note.  ALLERGIES:  has No Known Allergies.  MEDICATIONS:  Current Outpatient Prescriptions  Medication Sig Dispense Refill  . tacrolimus (PROTOPIC) 0.1 % ointment Apply 1 Dose topically daily.    . aspirin EC 81 MG tablet Take 81 mg by mouth daily.    . Calcium Carb-Cholecalciferol 500-600 MG-UNIT TABS Take 1 tablet by mouth every morning.     . Cholecalciferol (VITAMIN D3) 2000 units TABS Take 2,000 Units by mouth every other day.     . megestrol (MEGACE) 40 MG tablet Take 2 tablets (80 mg total) by mouth 2 (two) times daily. X 3 weeks alternating with tamoxifen 84 tablet 6  . simvastatin (ZOCOR) 40 MG tablet Take 40 mg by mouth every evening.    . tamoxifen (NOLVADEX) 20 MG tablet Take 1 tablet (20 mg total) by mouth 2 (two) times daily. X 3 weeks alternating with Megace. 42 tablet 6  . venlafaxine XR (EFFEXOR-XR) 75 MG 24 hr capsule Take 75 mg by mouth daily with breakfast.      No current facility-administered medications for this visit.     PHYSICAL EXAMINATION: ECOG PERFORMANCE STATUS: 0 - Asymptomatic  Vitals:   12/14/16 0832  BP: (!)   151/83  Pulse: 80  Resp: 20  Temp: 98.8 F (37.1 C)   Filed Weights   12/14/16 0832  Weight: 160 lb 4.8 oz (72.7 kg)    GENERAL:alert, no distress and comfortable SKIN: skin color, texture, turgor are normal, no rashes or significant lesions EYES: normal, Conjunctiva are pink and non-injected, sclera clear OROPHARYNX:no exudate, no erythema and lips, buccal mucosa, and tongue normal  NECK: supple, thyroid normal size, non-tender, without nodularity LYMPH:  no palpable lymphadenopathy in the cervical, axillary or inguinal LUNGS: clear to auscultation and percussion with normal breathing effort HEART: regular rate & rhythm and no murmurs and no lower extremity edema ABDOMEN:abdomen soft, non-tender and normal bowel sounds Musculoskeletal:no cyanosis of digits and no clubbing  NEURO: alert & oriented  x 3 with fluent speech, no focal motor/sensory deficits  LABORATORY DATA:  I have reviewed the data as listed    Component Value Date/Time   NA 142 12/14/2016 0810   K 3.4 (L) 12/14/2016 0810   CL 102 01/22/2014 0853   CL 105 11/19/2012 0848   CO2 22 12/14/2016 0810   GLUCOSE 101 12/14/2016 0810   GLUCOSE 122 (H) 11/19/2012 0848   BUN 14.0 12/14/2016 0810   CREATININE 0.8 12/14/2016 0810   CALCIUM 9.5 12/14/2016 0810   PROT 6.8 12/14/2016 0810   ALBUMIN 3.7 12/14/2016 0810   AST 21 12/14/2016 0810   ALT 18 12/14/2016 0810   ALKPHOS 41 12/14/2016 0810   BILITOT 0.47 12/14/2016 0810   GFRNONAA >90 01/22/2014 0853   GFRAA >90 01/22/2014 0853    No results found for: SPEP, UPEP  Lab Results  Component Value Date   WBC 2.9 (L) 12/14/2016   NEUTROABS 1.9 12/14/2016   HGB 12.4 12/14/2016   HCT 36.7 12/14/2016   MCV 99.7 12/14/2016   PLT 154 12/14/2016      Chemistry      Component Value Date/Time   NA 142 12/14/2016 0810   K 3.4 (L) 12/14/2016 0810   CL 102 01/22/2014 0853   CL 105 11/19/2012 0848   CO2 22 12/14/2016 0810   BUN 14.0 12/14/2016 0810   CREATININE 0.8 12/14/2016 0810   GLU 158 (H) 01/14/2015 1035      Component Value Date/Time   CALCIUM 9.5 12/14/2016 0810   ALKPHOS 41 12/14/2016 0810   AST 21 12/14/2016 0810   ALT 18 12/14/2016 0810   BILITOT 0.47 12/14/2016 0810      ASSESSMENT & PLAN:  Endometrial cancer (HCC) The patient have a long-standing history of endometrial cancer for the past 4 years. In the past year, she had recurrent disease, managed with anti-estrogen therapy and palliative radiation treatment. Her recent tumor markers appeared to have responded to recent palliative radiation treatment. She feels well with no clinical signs of active disease I will continue to tumor marker monitoring PET CT scan from 09/12/2016 show no evidence of active disease with complete metabolic response. I do not intend to repeat imaging study again for  at least another 6 months to a year, unless the patient show signs or symptoms of disease recurrence. I will see her back again in 3 month  Leukopenia due to antineoplastic chemotherapy (HCC) This is likely due to recent treatment. The patient denies recent history of fevers, cough, chills, diarrhea or dysuria. She is asymptomatic from the leukopenia. I will observe for now.    Orders Placed This Encounter  Procedures  . NM PET Image Restag (PS) Skull Base To Thigh      Standing Status:   Future    Standing Expiration Date:   02/13/2018    Order Specific Question:   Reason for Exam (SYMPTOM  OR DIAGNOSIS REQUIRED)    Answer:   staging metastatic endometrial ca    Order Specific Question:   Preferred imaging location?    Answer:   [facility]   All questions were answered. The patient knows to call the clinic with any problems, questions or concerns. No barriers to learning was detected. I spent 15 minutes counseling the patient face to face. The total time spent in the appointment was 20 minutes and more than 50% was on counseling and review of test results     [name], MD 12/15/2016 5:32 PM

## 2016-12-15 NOTE — Assessment & Plan Note (Signed)
The patient have a long-standing history of endometrial cancer for the past 4 years. In the past year, she had recurrent disease, managed with anti-estrogen therapy and palliative radiation treatment. Her recent tumor markers appeared to have responded to recent palliative radiation treatment. She feels well with no clinical signs of active disease I will continue to tumor marker monitoring PET CT scan from 09/12/2016 show no evidence of active disease with complete metabolic response. I do not intend to repeat imaging study again for at least another 6 months to a year, unless the patient show signs or symptoms of disease recurrence. I will see her back again in 3 month

## 2016-12-21 DIAGNOSIS — Z79810 Long term (current) use of selective estrogen receptor modulators (SERMs): Secondary | ICD-10-CM | POA: Diagnosis not present

## 2016-12-21 DIAGNOSIS — C541 Malignant neoplasm of endometrium: Secondary | ICD-10-CM | POA: Diagnosis not present

## 2016-12-21 DIAGNOSIS — Z9079 Acquired absence of other genital organ(s): Secondary | ICD-10-CM | POA: Diagnosis not present

## 2016-12-21 DIAGNOSIS — K5900 Constipation, unspecified: Secondary | ICD-10-CM | POA: Diagnosis not present

## 2016-12-21 DIAGNOSIS — Z90722 Acquired absence of ovaries, bilateral: Secondary | ICD-10-CM | POA: Diagnosis not present

## 2016-12-21 DIAGNOSIS — Z5181 Encounter for therapeutic drug level monitoring: Secondary | ICD-10-CM | POA: Diagnosis not present

## 2016-12-21 DIAGNOSIS — Z87891 Personal history of nicotine dependence: Secondary | ICD-10-CM | POA: Diagnosis not present

## 2016-12-21 DIAGNOSIS — Z90710 Acquired absence of both cervix and uterus: Secondary | ICD-10-CM | POA: Diagnosis not present

## 2016-12-26 MED FILL — TAMOXIFEN 20 MG TABLET: 20 | 21 days supply | Qty: 42 | Fill #2

## 2016-12-26 MED FILL — MEGESTROL 40 MG TABLET: 40 | 21 days supply | Qty: 84 | Fill #2

## 2016-12-28 ENCOUNTER — Other Ambulatory Visit: Payer: Self-pay | Admitting: Family Medicine

## 2016-12-28 DIAGNOSIS — Z1231 Encounter for screening mammogram for malignant neoplasm of breast: Secondary | ICD-10-CM

## 2017-01-01 DIAGNOSIS — C541 Malignant neoplasm of endometrium: Secondary | ICD-10-CM | POA: Diagnosis not present

## 2017-01-01 DIAGNOSIS — Z1389 Encounter for screening for other disorder: Secondary | ICD-10-CM | POA: Diagnosis not present

## 2017-01-01 DIAGNOSIS — Z6827 Body mass index (BMI) 27.0-27.9, adult: Secondary | ICD-10-CM | POA: Diagnosis not present

## 2017-01-01 DIAGNOSIS — E041 Nontoxic single thyroid nodule: Secondary | ICD-10-CM | POA: Diagnosis not present

## 2017-01-03 DIAGNOSIS — Z90710 Acquired absence of both cervix and uterus: Secondary | ICD-10-CM | POA: Diagnosis not present

## 2017-01-10 MED FILL — SIMVASTATIN 40 MG TABLET: 40 | 90 days supply | Qty: 90 | Fill #3

## 2017-01-16 ENCOUNTER — Ambulatory Visit
Admission: RE | Admit: 2017-01-16 | Discharge: 2017-01-16 | Disposition: A | Discharge status: Home / Self Care | Payer: 59 | Source: Ambulatory Visit | Attending: Family Medicine | Admitting: Family Medicine

## 2017-01-16 DIAGNOSIS — Z1231 Encounter for screening mammogram for malignant neoplasm of breast: Secondary | ICD-10-CM

## 2017-01-26 MED FILL — PEG-3350 SOLUTION: 420 | 1 days supply | Qty: 4000 | Fill #0

## 2017-02-02 DIAGNOSIS — Z1211 Encounter for screening for malignant neoplasm of colon: Secondary | ICD-10-CM | POA: Diagnosis not present

## 2017-02-02 DIAGNOSIS — K640 First degree hemorrhoids: Secondary | ICD-10-CM | POA: Diagnosis not present

## 2017-02-06 MED FILL — MEGESTROL 40 MG TABLET: 40 | 21 days supply | Qty: 84 | Fill #3

## 2017-02-06 MED FILL — TAMOXIFEN CITRATE 20 MG TAB: 20 | 21 days supply | Qty: 42 | Fill #3

## 2017-02-08 DIAGNOSIS — Z0000 Encounter for general adult medical examination without abnormal findings: Secondary | ICD-10-CM | POA: Diagnosis not present

## 2017-02-08 DIAGNOSIS — E663 Overweight: Secondary | ICD-10-CM | POA: Diagnosis not present

## 2017-02-08 DIAGNOSIS — M859 Disorder of bone density and structure, unspecified: Secondary | ICD-10-CM | POA: Diagnosis not present

## 2017-02-08 DIAGNOSIS — Z6828 Body mass index (BMI) 28.0-28.9, adult: Secondary | ICD-10-CM | POA: Diagnosis not present

## 2017-02-08 DIAGNOSIS — E559 Vitamin D deficiency, unspecified: Secondary | ICD-10-CM | POA: Diagnosis not present

## 2017-02-08 DIAGNOSIS — Z8542 Personal history of malignant neoplasm of other parts of uterus: Secondary | ICD-10-CM | POA: Diagnosis not present

## 2017-02-08 DIAGNOSIS — F411 Generalized anxiety disorder: Secondary | ICD-10-CM | POA: Diagnosis not present

## 2017-02-08 DIAGNOSIS — Z23 Encounter for immunization: Secondary | ICD-10-CM | POA: Diagnosis not present

## 2017-02-08 DIAGNOSIS — E7800 Pure hypercholesterolemia, unspecified: Secondary | ICD-10-CM | POA: Diagnosis not present

## 2017-02-28 ENCOUNTER — Telehealth: Payer: Self-pay | Admitting: Hematology and Oncology

## 2017-02-28 NOTE — Telephone Encounter (Signed)
R/s lab apt per 9/26 sch message - patient is aware of appt date and time.

## 2017-03-08 MED FILL — MEGESTROL 40 MG TABLET: 40 | 21 days supply | Qty: 84 | Fill #4

## 2017-03-08 MED FILL — VENLAFAXINE HCL ER 75 MG CA: 75 | 90 days supply | Qty: 90 | Fill #0

## 2017-03-08 MED FILL — TAMOXIFEN CITRATE 20 MG TAB: 20 | 21 days supply | Qty: 42 | Fill #4

## 2017-03-13 ENCOUNTER — Telehealth: Payer: Self-pay | Admitting: *Deleted

## 2017-03-13 ENCOUNTER — Other Ambulatory Visit: Payer: 59

## 2017-03-13 ENCOUNTER — Telehealth: Payer: Self-pay | Admitting: Hematology and Oncology

## 2017-03-13 ENCOUNTER — Other Ambulatory Visit (HOSPITAL_BASED_OUTPATIENT_CLINIC_OR_DEPARTMENT_OTHER): Payer: 59

## 2017-03-13 ENCOUNTER — Ambulatory Visit (HOSPITAL_COMMUNITY)
Admission: RE | Admit: 2017-03-13 | Discharge: 2017-03-13 | Disposition: A | Discharge status: Home / Self Care | Payer: 59 | Source: Ambulatory Visit | Attending: Hematology and Oncology | Admitting: Hematology and Oncology

## 2017-03-13 DIAGNOSIS — C541 Malignant neoplasm of endometrium: Secondary | ICD-10-CM | POA: Insufficient documentation

## 2017-03-13 DIAGNOSIS — I517 Cardiomegaly: Secondary | ICD-10-CM | POA: Insufficient documentation

## 2017-03-13 DIAGNOSIS — I700 Atherosclerosis of aorta: Secondary | ICD-10-CM | POA: Insufficient documentation

## 2017-03-13 DIAGNOSIS — E041 Nontoxic single thyroid nodule: Secondary | ICD-10-CM | POA: Diagnosis not present

## 2017-03-13 DIAGNOSIS — C772 Secondary and unspecified malignant neoplasm of intra-abdominal lymph nodes: Secondary | ICD-10-CM | POA: Insufficient documentation

## 2017-03-13 LAB — CBC WITH DIFFERENTIAL/PLATELET
BASO%: 1.7 % (ref 0.0–2.0)
Basophils Absolute: 0.1 10*3/uL (ref 0.0–0.1)
EOS%: 4.3 % (ref 0.0–7.0)
Eosinophils Absolute: 0.2 10*3/uL (ref 0.0–0.5)
HCT: 35.7 % (ref 34.8–46.6)
HGB: 12.2 g/dL (ref 11.6–15.9)
LYMPH%: 21 % (ref 14.0–49.7)
MCH: 33.7 pg (ref 25.1–34.0)
MCHC: 34.3 g/dL (ref 31.5–36.0)
MCV: 98.4 fL (ref 79.5–101.0)
MONO#: 0.3 10*3/uL (ref 0.1–0.9)
MONO%: 7.5 % (ref 0.0–14.0)
NEUT#: 2.4 10*3/uL (ref 1.5–6.5)
NEUT%: 65.5 % (ref 38.4–76.8)
Platelets: 184 10*3/uL (ref 145–400)
RBC: 3.62 10*6/uL — ABNORMAL LOW (ref 3.70–5.45)
RDW: 12.7 % (ref 11.2–14.5)
WBC: 3.6 10*3/uL — ABNORMAL LOW (ref 3.9–10.3)
lymph#: 0.8 10*3/uL — ABNORMAL LOW (ref 0.9–3.3)

## 2017-03-13 LAB — COMPREHENSIVE METABOLIC PANEL
ALT: 13 U/L (ref 0–55)
AST: 20 U/L (ref 5–34)
Albumin: 3.9 g/dL (ref 3.5–5.0)
Alkaline Phosphatase: 40 U/L (ref 40–150)
Anion Gap: 7 mEq/L (ref 3–11)
BUN: 16 mg/dL (ref 7.0–26.0)
CO2: 23 mEq/L (ref 22–29)
Calcium: 9.5 mg/dL (ref 8.4–10.4)
Chloride: 111 mEq/L — ABNORMAL HIGH (ref 98–109)
Creatinine: 0.8 mg/dL (ref 0.6–1.1)
EGFR: 79 mL/min/{1.73_m2} — ABNORMAL LOW (ref >90)
Glucose: 87 mg/dl (ref 70–140)
Potassium: 3.9 mEq/L (ref 3.5–5.1)
Sodium: 142 mEq/L (ref 136–145)
Total Bilirubin: 0.4 mg/dL (ref 0.20–1.20)
Total Protein: 7.3 g/dL (ref 6.4–8.3)

## 2017-03-13 LAB — GLUCOSE, CAPILLARY: Glucose-Capillary: 81 mg/dL (ref 65–99)

## 2017-03-13 MED ORDER — FLUDEOXYGLUCOSE F - 18 (FDG) INJECTION
8.0000 | Freq: Once | INTRAVENOUS | Status: AC | PRN
  Administered 2017-03-13: 8 via INTRAVENOUS

## 2017-03-13 NOTE — Telephone Encounter (Signed)
I reviewed her PET CT scan Unfortunately, it showed evidence of local recurrence The patient is not symptomatic She denies recent infection I believe the area of recurrence have been irradiated before.  Just to be sure, I will reach out to the radiation oncologist I have discussed this result with her GYN oncologist.  She will have a return visit appointment next week The patient is informed of plan of care and she agreed

## 2017-03-13 NOTE — Telephone Encounter (Signed)
PET results faxed to [place] at Dr Shelba Flake office

## 2017-03-14 LAB — CA 125: Cancer Antigen (CA) 125: 8.6 U/mL (ref 0.0–38.1)

## 2017-03-15 DIAGNOSIS — C541 Malignant neoplasm of endometrium: Secondary | ICD-10-CM | POA: Diagnosis not present

## 2017-03-15 DIAGNOSIS — Z90710 Acquired absence of both cervix and uterus: Secondary | ICD-10-CM | POA: Diagnosis not present

## 2017-03-15 DIAGNOSIS — Z8542 Personal history of malignant neoplasm of other parts of uterus: Secondary | ICD-10-CM | POA: Diagnosis not present

## 2017-03-15 DIAGNOSIS — Z923 Personal history of irradiation: Secondary | ICD-10-CM | POA: Diagnosis not present

## 2017-03-15 DIAGNOSIS — Z90722 Acquired absence of ovaries, bilateral: Secondary | ICD-10-CM | POA: Diagnosis not present

## 2017-03-15 DIAGNOSIS — Z9079 Acquired absence of other genital organ(s): Secondary | ICD-10-CM | POA: Diagnosis not present

## 2017-03-15 DIAGNOSIS — C771 Secondary and unspecified malignant neoplasm of intrathoracic lymph nodes: Secondary | ICD-10-CM | POA: Diagnosis not present

## 2017-03-15 DIAGNOSIS — Z87891 Personal history of nicotine dependence: Secondary | ICD-10-CM | POA: Diagnosis not present

## 2017-03-15 DIAGNOSIS — Z170 Estrogen receptor positive status [ER+]: Secondary | ICD-10-CM | POA: Diagnosis not present

## 2017-03-19 ENCOUNTER — Other Ambulatory Visit: Payer: 59

## 2017-03-20 ENCOUNTER — Ambulatory Visit (HOSPITAL_BASED_OUTPATIENT_CLINIC_OR_DEPARTMENT_OTHER): Payer: 59 | Admitting: Hematology and Oncology

## 2017-03-20 ENCOUNTER — Encounter: Payer: Self-pay | Admitting: Hematology and Oncology

## 2017-03-20 DIAGNOSIS — C541 Malignant neoplasm of endometrium: Secondary | ICD-10-CM

## 2017-03-20 DIAGNOSIS — R590 Localized enlarged lymph nodes: Secondary | ICD-10-CM | POA: Diagnosis not present

## 2017-03-20 NOTE — Assessment & Plan Note (Signed)
She has isolated lymphadenopathy in her chest We discussed the risk and benefit of repeat biopsy Patient is undecided She is currently symptomatic If she decides to proceed with biopsy, I will refer her to pulmonologist for bronchoscopy and biopsy

## 2017-03-20 NOTE — Progress Notes (Signed)
[place] OFFICE PROGRESS NOTE  Patient Care Team: [name], MD as PCP - General (Family Medicine)  SUMMARY OF ONCOLOGIC HISTORY: Oncology History   [employer] testing done 11-2015 on surgical path from 2014: MS stable TMB low 4 muts/mb ATM [id number] ERBB3 T389K E2H2 rearrangement exon 9 PPP2R1A P179R TP53 [id number]    ER- APPROXIMATELY 25-35% STAINING IN NEOPLASTIC CELLS (INTERMEDIATE)  PR- APPROXIMATELY 25-35% STAINING IN NEOPLASTIC CELLS (STRONG)      Endometrial cancer ([place])   06/20/2012 Pathology Results    Biopsy positive for papillary serous carcinoma      06/20/2012 Genetic Testing    [employer] testing done 11-2015 on surgical path from 2014: MS stable TMB low 4 muts/mb ATM [id number] ERBB3 T389K E2H2 rearrangement exon 9 PPP2R1A P179R TP53 [id number]       06/20/2012 Initial Diagnosis    Patient presented to PCP with intermittent vaginal bleeding since ~ Oct 2013, endometrial biopsy 06-20-12 with complex endometrial hyperplasia with atypia      06/26/2012 Imaging    Thickened endometrial lining in a postmenopausal patient experiencing vaginal bleeding. In the setting of post-menopausal bleeding, endometrial sampling is indicated to exclude carcinoma. No focal myometrial abnormalities are seen.  Normal left ovary and non-visualized right ovary      07/30/2012 Surgery    Dr. Polly Cobia performed robotic hysterectomy with bilateral salpingo-oophorectomy, bilateral pelvic lymph node dissection and periaortic lymph node dissection. Intraoperatively on frozen section, the patient was noted to have a large endometrial polyp with changes within the polyp consistent for high-grade malignancy, possibly papillary serous carcinoma. There is no obvious extrauterine disease noted.        07/30/2012 Pathology Results    [phone] SUPPLEMENTAL REPORT  THE ENDOMETRIAL CARCINOMA WAS ANALYZED FOR DNA MISMATCH REPAIR  PROTEINS.IMMUNOHISTOCHEMICALLY, THE NEOPLASM RETAINED NUCLEAR EXPRESSION OF 4 GENE PRODUCTS, MLH1, MSH2, MSH6, AND PMS2, INVOLVED IN DNA MISMATCH REPAIR.POSITIVE AND NEGATIVE CONTROLS WORKED APPROPRIATELY.  PER REQUEST, AN ER AND PR ARE PERFORMED ON BLOCK 1I.  ER- APPROXIMATELY 25-35% STAINING IN NEOPLASTIC CELLS (INTERMEDIATE)  PR- APPROXIMATELY 25-35% STAINING IN NEOPLASTIC CELLS (STRONG)  ER AND PR PREDOMINANTLY SHOW STAINING IN THE SEROUS COMPONENT.  DIAGNOSIS  1. UTERUS, CERVIX WITH BILATERAL FALLOPIAN TUBES AND OVARIES,  HYSTERECTOMY AND BILATERAL SALPINGO-OOPHORECTOMY:  HIGH GRADE/POORLY DIFFERENTIATED ENDOMETRIAL ADENOCARCINOMA WITH SOLID AND SEROUS PAPILLARY COMPONENTS.  HISTOPATHOLOGIC TYPE:A VARIETY OF PATTERNS WERE PRESENT, ALL OF WHICH SHOULD BE CONSIDERED TO BE HIGH GRADE.SEROUS PAPILLARY CARCINOMA WAS SEEN OCCURRING ADJACENT TO SOLID ENDOMETRIAL CARCINOMA WITH MARKED ANAPLASIA AND GIANT CELLS. SIZE:TUMOR MEASURED AT LEAST 4.8 CM IN GREATEST HORIZONTAL DIMENSION.  GRADE: POORLY DIFFERENTIATED OR HIGH GRADE. DEPTH OF INVASION: NO DEFINITE MYOMETRIAL INVOLVEMENT WAS SEEN.THE TUMOR APPEARED CONFINED TO THE POLYP AS WELL AS SURFACE ENDOMETRIUM. WHERE MYOMETRIAL THICKNESS NOT APPLICABLE. SEROSAL INVOLVEMENT: NOT DEMONSTRATED.  ENDOCERVICAL INVOLVEMENT:NOT DEMONSTRATED. RESECTION MARGINS: FREE OF INVOLVEMENT. EXTRAUTERINE EXTENSION:NOT DEMONSTRATED. ANGIOLYMPHATIC INVASION: NOT DEFINITELY SEEN. TOTAL NODES EXAMINED:22.  PELVIC NODES EXAMINED:20.  PELVIC NODES INVOLVED:0.  PARA-AORTIC NODES 2. EXAMINED:  PARA-AORTIC NODES 0. INVOLVED TNM STAGE: T1A N0 MX. AJCC STAGE GROUPING: IA. FIGO STAGE:IA.      08/26/2012 Imaging    No CT evidence for intra-abdominal or pelvic metastatic disease. Trace free pelvic fluid, presumably postoperative although the date of surgery is not  documented in the electronic medical record.      08/26/2012 - 12/13/2012 Chemotherapy    She received 6 cycles of carbo/taxol       10/29/2012 - 11/27/2012 Radiation Therapy  May 27, June 5,  June 11, June 19, November 27, 2012: Proximal vagina 30 Gy in 5 fractions        01/17/2013 Imaging    1.  No evidence of recurrent or metastatic disease. 2.  No acute abnormality involving the abdomen or pelvis. 3.  Mild diffuse hepatic steatosis. 4.  Very small supraumbilical midline anterior abdominal wall hernia containing fat, unchanged      01/22/2014 Imaging    No evidence for metastatic or recurrent disease. 2. No bowel obstruction.  Normal appendix. 3. Small fat containing hernia, stable in appearance. 4. Status post hysterectomy and bilateral oophorectomy      02/19/2014 Imaging    No pulmonary lesions are identified. 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 No other sites of metastatic disease identified within the abdomen or pelvis.        11/27/2014 - 02/11/2015 Chemotherapy    She received 4 cycles of carbo/taxol       03/01/2015 PET scan    Single hypermetabolic small retroperitoneal lymph node along the aorta. 2. No evidence of metastatic disease otherwise in the abdomen or pelvis. No evidence local recurrence. 3. Intensely hypermetabolic enlarged nodule adjacent to the RIGHT lobe of thyroid gland. This presumably represents the biopsied lesion in clinician report which was found to be benign thyroid tissue.      04/05/2015 - 05/13/2015 Radiation Therapy    She received 50.4 gray in 28 fractions with simultaneous integrated boost to 56 gray      06/17/2015 Imaging    No acute process or evidence of metastatic disease in the abdomen or pelvis.  Resolution of previously described retroperitoneal adenopathy. 2.  Possible constipation. 3. Atherosclerosis.      06/17/2015 Tumor Marker    Patient's tumor was tested for the following markers: CA125 Results of the tumor marker test revealed 33      08/12/2015 Tumor Marker    Patient's tumor was tested for the following markers: CA125 Results of the tumor marker test revealed 52      08/31/2015 PET scan    Development of right paratracheal hypermetabolic adenopathy, consistent with nodal metastasis. 2. The previously described isolated abdominal retroperitoneal hypermetabolic node has resolved. 3. Persistent hypermetabolic right thyroid nodule, per report previously biopsied. Correlate with those results. 4.  Possible constipation.      09/09/2015 -  Anti-estrogen oral therapy    She has been receiving alternative treatment between megace and tamoxifen      09/09/2015 Tumor Marker    Patient's tumor was tested for the following markers: CA125 Results of the tumor marker test revealed 37.8      09/20/2015 Procedure    Technically successful ultrasound-guided thyroid aspiration biopsy , dominant right nodule      09/20/2015 Pathology Results    THYROID, RIGHT, FINE NEEDLE ASPIRATION (SPECIMEN 1 OF 1 COLLECTED 09/20/15): FINDINGS CONSISTENT WITH BENIGN THYROID NODULE (BETHESDA CATEGORY II).      10/28/2015 Tumor Marker    Patient's tumor was tested for the following markers: CA125 Results of the tumor marker test revealed 53.2      11/04/2015 Imaging    Stable benign right thyroid nodule      12/16/2015 Tumor Marker    Patient's tumor was tested for the following markers:  CA125 Results of the tumor marker test revealed 38.1      03/22/2016 PET scan    Interval progression of hypermetabolic right paratracheal lymph node consistent with metastatic involvement. 2. Stable hypermetabolic right thyroid nodule. Reportedly this has been biopsied in the past.      03/23/2016 Tumor Marker     Patient's tumor was tested for the following markers: CA125 Results of the tumor marker test revealed 62.4      04/13/2016 - 06/01/2016 Radiation Therapy    She received 56 Gy to the chest in 28 fractions       05/09/2016 Imaging    No evidence of left lower extremity deep vein thrombosis. No evidence of a superficial thrombosis of the greater and lesser saphenous veins. Positive for thrombus noted in several varicosities of the calf. No evidence of Baker's cyst on the left.      05/17/2016 Tumor Marker    Patient's tumor was tested for the following markers: CA125 Results of the tumor marker test revealed 9      06/29/2016 Tumor Marker    Patient's tumor was tested for the following markers: CA125 Results of the tumor marker test revealed 5.9      08/04/2016 Tumor Marker    Patient's tumor was tested for the following markers: CA125 Results of the tumor marker test revealed 6.0      09/12/2016 PET scan    Complete metabolic response to therapy, with resolution of hypermetabolic mediastinal lymphadenopathy since prior exam. No residual or new metastatic disease identified. Stable hypermetabolic right thyroid lobe nodule, which was previously biopsied on 09/20/2015.      03/13/2017 PET scan    1. Solitary focus of recurrent right paratracheal hypermetabolic activity, with a 1.0 cm right lower paratracheal node having a maximum SUV of 9.0. Appearance compatible with recurrent malignancy. 2. Continued hypermetabolic right thyroid nodule, previously biopsied, presumed benign -correlate with prior biopsy results. 3. Other imaging findings of potential clinical significance: Aortic Atherosclerosis (ICD10-I70.0). Mild cardiomegaly. Prominent stool throughout the colon favors constipation.       INTERVAL HISTORY: Please see below for problem oriented charting. She returns for further follow-up She is doing well Denies chest pain, cough or shortness of breath She tolerated treatment  well without any side effects She has met with her GYN oncologist yesterday who has referred her to [facility] for [facility] clinical trial I printed current guidelines, review imaging studies and discuss options with her today  REVIEW OF SYSTEMS:   Constitutional: Denies fevers, chills or abnormal weight loss Eyes: Denies blurriness of vision Ears, nose, mouth, throat, and face: Denies mucositis or sore throat Respiratory: Denies cough, dyspnea or wheezes Cardiovascular: Denies palpitation, chest discomfort or lower extremity swelling Gastrointestinal:  Denies nausea, heartburn or change in bowel habits Skin: Denies abnormal skin rashes Lymphatics: Denies new lymphadenopathy or easy bruising Neurological:Denies numbness, tingling or new weaknesses Behavioral/Psych: Mood is stable, no new changes  All other systems were reviewed with the patient and are negative.  I have reviewed the past medical history, past surgical history, social history and family history with the patient and they are unchanged from previous note.  ALLERGIES:  has No Known Allergies.  MEDICATIONS:  Current Outpatient Prescriptions  Medication Sig Dispense Refill  . aspirin EC 81 MG tablet Take 81 mg by mouth daily.    . Calcium Carb-Cholecalciferol 500-600 MG-UNIT TABS Take 1 tablet by mouth every morning.     . Cholecalciferol (VITAMIN D3) 2000 units TABS Take  2,000 Units by mouth every other day.     . megestrol (MEGACE) 40 MG tablet Take 2 tablets (80 mg total) by mouth 2 (two) times daily. X 3 weeks alternating with tamoxifen 84 tablet 6  . simvastatin (ZOCOR) 40 MG tablet Take 40 mg by mouth every evening.    . tacrolimus (PROTOPIC) 0.1 % ointment Apply 1 Dose topically daily.    . tamoxifen (NOLVADEX) 20 MG tablet Take 1 tablet (20 mg total) by mouth 2 (two) times daily. X 3 weeks alternating with Megace. 42 tablet 6  . venlafaxine XR (EFFEXOR-XR) 75 MG 24 hr capsule Take 75 mg by mouth daily with breakfast.       No current facility-administered medications for this visit.     PHYSICAL EXAMINATION: ECOG PERFORMANCE STATUS: 0 - Asymptomatic  Vitals:   03/20/17 0838  BP: (!) 165/84  Pulse: 82  Resp: 18  Temp: 98.5 F (36.9 C)  SpO2: 98%   Filed Weights   03/20/17 0838  Weight: 168 lb (76.2 kg)    GENERAL:alert, no distress and comfortable SKIN: skin color, texture, turgor are normal, no rashes or significant lesions EYES: normal, Conjunctiva are pink and non-injected, sclera clear Musculoskeletal:no cyanosis of digits and no clubbing  NEURO: alert & oriented x 3 with fluent speech, no focal motor/sensory deficits  LABORATORY DATA:  I have reviewed the data as listed    Component Value Date/Time   NA 142 03/13/2017 1112   K 3.9 03/13/2017 1112   CL 102 01/22/2014 0853   CL 105 11/19/2012 0848   CO2 23 03/13/2017 1112   GLUCOSE 87 03/13/2017 1112   GLUCOSE 122 (H) 11/19/2012 0848   BUN 16.0 03/13/2017 1112   CREATININE 0.8 03/13/2017 1112   CALCIUM 9.5 03/13/2017 1112   PROT 7.3 03/13/2017 1112   ALBUMIN 3.9 03/13/2017 1112   AST 20 03/13/2017 1112   ALT 13 03/13/2017 1112   ALKPHOS 40 03/13/2017 1112   BILITOT 0.40 03/13/2017 1112   GFRNONAA >90 01/22/2014 0853   GFRAA >90 01/22/2014 0853    No results found for: SPEP, UPEP  Lab Results  Component Value Date   WBC 3.6 (L) 03/13/2017   NEUTROABS 2.4 03/13/2017   HGB 12.2 03/13/2017   HCT 35.7 03/13/2017   MCV 98.4 03/13/2017   PLT 184 03/13/2017      Chemistry      Component Value Date/Time   NA 142 03/13/2017 1112   K 3.9 03/13/2017 1112   CL 102 01/22/2014 0853   CL 105 11/19/2012 0848   CO2 23 03/13/2017 1112   BUN 16.0 03/13/2017 1112   CREATININE 0.8 03/13/2017 1112   GLU 158 (H) 01/14/2015 1035      Component Value Date/Time   CALCIUM 9.5 03/13/2017 1112   ALKPHOS 40 03/13/2017 1112   AST 20 03/13/2017 1112   ALT 13 03/13/2017 1112   BILITOT 0.40 03/13/2017 1112       RADIOGRAPHIC  STUDIES: I have reviewed multiple imaging studies with the patient I have personally reviewed the radiological images as listed and agreed with the findings in the report. Nm Pet Image Restag (ps) Skull Base To Thigh  Result Date: 03/13/2017 CLINICAL DATA:  Subsequent treatment strategy for endometrial cancer. EXAM: NUCLEAR MEDICINE PET SKULL BASE TO THIGH TECHNIQUE: 8.0 mCi F-18 FDG was injected intravenously. Full-ring PET imaging was performed from the skull base to thigh after the radiotracer. CT data was obtained and used for attenuation correction and anatomic  localization. FASTING BLOOD GLUCOSE:  Value: 81 mg/dl COMPARISON:  Multiple exams, including 09/12/2016 FINDINGS: NECK No hypermetabolic lymph nodes in the neck. Hypermetabolic right thyroid nodule, maximum SUV 7.8, previously 9.3, correlate with previous biopsy results. CHEST 1.0 cm a right lower paratracheal lymph node on image 63/4 (formerly 0.7 cm) is newly hypermetabolic with maximum SUV 9.0. Right paramediastinal bandlike density compatible with prior radiation therapy. Atherosclerotic calcification of the aortic arch. Mild cardiomegaly. ABDOMEN/PELVIS No abnormal hypermetabolic activity within the liver, pancreas, adrenal glands, or spleen. No hypermetabolic lymph nodes in the abdomen or pelvis. Mild atherosclerotic calcification of the abdominal aorta. Prominent stool throughout the colon favors constipation. SKELETON Lower lumbar degenerative facet arthropathy. No focal bony hypermetabolic activity to suggest osseous metastatic disease. IMPRESSION: 1. Solitary focus of recurrent right paratracheal hypermetabolic activity, with a 1.0 cm right lower paratracheal node having a maximum SUV of 9.0. Appearance compatible with recurrent malignancy. 2. Continued hypermetabolic right thyroid nodule, previously biopsied, presumed benign -correlate with prior biopsy results. 3. Other imaging findings of potential clinical significance: Aortic  Atherosclerosis (ICD10-I70.0). Mild cardiomegaly. Prominent stool throughout the colon favors constipation. Electronically Signed   By: Van Clines M.D.   On: 03/13/2017 13:07    ASSESSMENT & PLAN:  Endometrial cancer ([place]) The patient had high risk disease She had disease relapse in the past Currently, she tolerated Megace alternate with tamoxifen well Recent PET CT scan showed isolated mediastinal lymphadenopathy, suspicious for cancer recurrence This area had been irradiated before but never biopsied We reviewed multiple imaging studies together Patient has past history of smoking Primary lung cancer has not been excluded We discussed the risk and benefits of repeat biopsy The patient has been referred to [facility] for clinical trial by her GYN oncologist We also reviewed the current NCCN guidelines and discussed briefly treatment options, should the patient decided not to proceed with a clinical trial At the end of the day, she is undecided She is not symptomatic The patient will review all the information with her family members and will call me with decision I addressed all her questions and concerns In the meantime, I recommend she continue taking tamoxifen alternate with Megace for now  Mediastinal adenopathy She has isolated lymphadenopathy in her chest We discussed the risk and benefit of repeat biopsy Patient is undecided She is currently symptomatic If she decides to proceed with biopsy, I will refer her to pulmonologist for bronchoscopy and biopsy   No orders of the defined types were placed in this encounter.  All questions were answered. The patient knows to call the clinic with any problems, questions or concerns. No barriers to learning was detected. I spent 30 minutes counseling the patient face to face. The total time spent in the appointment was 40 minutes and more than 50% was on counseling and review of test results     [name], MD 03/20/2017  11:02 AM

## 2017-03-20 NOTE — Assessment & Plan Note (Signed)
The patient had high risk disease She had disease relapse in the past Currently, she tolerated Megace alternate with tamoxifen well Recent PET CT scan showed isolated mediastinal lymphadenopathy, suspicious for cancer recurrence This area had been irradiated before but never biopsied We reviewed multiple imaging studies together Patient has past history of smoking Primary lung cancer has not been excluded We discussed the risk and benefits of repeat biopsy The patient has been referred to [facility] for clinical trial by her GYN oncologist We also reviewed the current NCCN guidelines and discussed briefly treatment options, should the patient decided not to proceed with a clinical trial At the end of the day, she is undecided She is not symptomatic The patient will review all the information with her family members and will call me with decision I addressed all her questions and concerns In the meantime, I recommend she continue taking tamoxifen alternate with Megace for now

## 2017-03-22 DIAGNOSIS — C541 Malignant neoplasm of endometrium: Secondary | ICD-10-CM | POA: Diagnosis not present

## 2017-03-23 ENCOUNTER — Telehealth: Payer: Self-pay

## 2017-03-23 ENCOUNTER — Other Ambulatory Visit: Payer: Self-pay | Admitting: Hematology and Oncology

## 2017-03-23 ENCOUNTER — Telehealth: Payer: Self-pay | Admitting: Hematology and Oncology

## 2017-03-23 DIAGNOSIS — C541 Malignant neoplasm of endometrium: Secondary | ICD-10-CM

## 2017-03-23 DIAGNOSIS — C771 Secondary and unspecified malignant neoplasm of intrathoracic lymph nodes: Secondary | ICD-10-CM

## 2017-03-23 NOTE — Telephone Encounter (Signed)
The patient contacted me with her decision She decided to proceed with bronchoscopy and biopsy She requests this to be done locally I have sent urgent request to pulmonary service for bronchoscopy and biopsy

## 2017-03-23 NOTE — Telephone Encounter (Signed)
Called with below message. States they will call patient and schedule appt.

## 2017-03-23 NOTE — Telephone Encounter (Signed)
-----   Message from [name], MD sent at 03/23/2017  1:29 PM EDT ----- Regarding: Pulm consult Patient request Dr. Lamonte Sakai if possible for bronchoscopy and biopsy Please call to see if they can work her in urgently

## 2017-03-28 ENCOUNTER — Ambulatory Visit (INDEPENDENT_AMBULATORY_CARE_PROVIDER_SITE_OTHER): Payer: 59 | Admitting: Pulmonary Disease

## 2017-03-28 ENCOUNTER — Encounter: Payer: Self-pay | Admitting: Pulmonary Disease

## 2017-03-28 ENCOUNTER — Other Ambulatory Visit (INDEPENDENT_AMBULATORY_CARE_PROVIDER_SITE_OTHER): Payer: 59

## 2017-03-28 VITALS — BP 160/90 | HR 83 | Ht 63.5 in | Wt 166.4 lb

## 2017-03-28 DIAGNOSIS — R590 Localized enlarged lymph nodes: Secondary | ICD-10-CM | POA: Diagnosis not present

## 2017-03-28 LAB — PROTIME-INR
INR: 0.9 ratio (ref 0.8–1.0)
Prothrombin Time: 10.2 s (ref 9.6–13.1)

## 2017-03-28 LAB — CBC
HCT: 39 % (ref 36.0–46.0)
Hemoglobin: 13.1 g/dL (ref 12.0–15.0)
MCHC: 33.5 g/dL (ref 30.0–36.0)
MCV: 101 fl — ABNORMAL HIGH (ref 78.0–100.0)
Platelets: 185 10*3/uL (ref 150.0–400.0)
RBC: 3.87 Mil/uL (ref 3.87–5.11)
RDW: 12.5 % (ref 11.5–15.5)
WBC: 4 10*3/uL (ref 4.0–10.5)

## 2017-03-28 LAB — APTT: aPTT: 26.3 s (ref 23.4–32.7)

## 2017-03-28 NOTE — Addendum Note (Signed)
Addended by: Rocky Morel D on: 03/28/2017 01:33 PM   Modules accepted: Orders

## 2017-03-28 NOTE — Patient Instructions (Signed)
We will schedule you for biopsy of the lymph node. We need to work with a scheduler to determine availability in the operating room We will get in touch with you once we have that information  We will check a CBC and PT/INR, PTT today in preparation for the procedure Follow-up in 1-2 months.

## 2017-03-28 NOTE — Progress Notes (Addendum)
Diane Lozano    [id number]    27-Mar-1956  Primary Care Physician:Miller, Lattie Haw, MD  Referring Physician: [name], MD [address] [place], [place] [address]  Chief complaint: Consult for evaluation of mediastinal lymphadenopathy.  HPI: 61 year old with history of recurrent endometrial cancer, diagnosed in 2014.  She underwent hysterectomy and oophorectomy followed by chemotherapy radiation.  She had a recurrence in 2016 with para-aortic lymph nodes.  There is another recurrence in 2017 with right paratracheal adenopathy on PET with raising CA 125 levels.  This was treated with radiation and improvement in PET imaging.  She has a PET scan this year which shows recurrence of right paratracheal adenopathy with positive PET activity.  This time her CA 125 levels not elevated.   There is a question if this represents a lung cancer, which would mean she would need different therapy.  She has been referred to pulmonary for further evaluation.  She is also followed up [facility] for consideration of enrollment in a clinical trial.  Of note PET scan shows persistent right thyroid activity.  This has been evaluated by biopsies which shows benign thyroid tissue.  Outpatient Encounter Prescriptions as of 03/28/2017  Medication Sig  . aspirin EC 81 MG tablet Take 81 mg by mouth daily.  . Calcium Carb-Cholecalciferol 500-600 MG-UNIT TABS Take 1 tablet by mouth every morning.   . Cholecalciferol (VITAMIN D3) 2000 units TABS Take 2,000 Units by mouth every other day.   . megestrol (MEGACE) 40 MG tablet Take 2 tablets (80 mg total) by mouth 2 (two) times daily. X 3 weeks alternating with tamoxifen  . simvastatin (ZOCOR) 40 MG tablet Take 40 mg by mouth every evening.  . tacrolimus (PROTOPIC) 0.1 % ointment Apply 1 Dose topically daily.  . tamoxifen (NOLVADEX) 20 MG tablet Take 1 tablet (20 mg total) by mouth 2 (two) times daily. X 3 weeks alternating with Megace.  . venlafaxine XR  (EFFEXOR-XR) 75 MG 24 hr capsule Take 75 mg by mouth daily with breakfast.    No facility-administered encounter medications on file as of 03/28/2017.     Allergies as of 03/28/2017  . (No Known Allergies)    Past Medical History:  Diagnosis Date  . 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 History of radiation therapy 04/13/16-06/01/16   chest 56 Gy in 28 fractions  . Hx of radiation therapy 5/27, 6/5, 6/11, 6/19, 11/27/2012   proximal vagina 30 Gy in 5 fx, HDR  . Hyperlipidemia   . Hypertension   . Radiation 04/05/15 - 05/13/15   retroperitoneal nodes 50.4 gray boosted to 56 gray    Past Surgical History:  Procedure Laterality Date  . ABDOMINAL HYSTERECTOMY     robotic hysterectomy with BSO and bilateral pelvic and periaortic node dissection  . CESAREAN SECTION    . CESAREAN SECTION    . COLONOSCOPY  2008    Family History  Problem Relation Age of Onset  . Dementia Mother   . Thyroid disease Mother   . Heart disease Father   . Renal Disease Father   . Cancer Brother        melanoma involving eye  . Polycystic ovary syndrome Daughter     Social History   Social History  . Marital status: Married    Spouse name: N/A  . Number of children: N/A  . Years of education: N/A   Occupational History  .  Nurse    Social History Main Topics  . Smoking status: Former Smoker    Quit date: 06/05/1982  . Smokeless tobacco: Never Used     Comment: approximate quit date  . Alcohol use Yes     Comment: occasional  . Drug use: No  . Sexual activity: Not on file   Other Topics Concern  . Not on file   Social History Narrative  . No narrative on file    Review of systems: Review of Systems  Constitutional: Negative for fever and chills.  HENT: Negative.   Eyes: Negative for blurred vision.  Respiratory: as per HPI  Cardiovascular: Negative for chest pain and palpitations.  Gastrointestinal: Negative for vomiting, diarrhea,  blood per rectum. Genitourinary: Negative for dysuria, urgency, frequency and hematuria.  Musculoskeletal: Negative for myalgias, back pain and joint pain.  Skin: Negative for itching and rash.  Neurological: Negative for dizziness, tremors, focal weakness, seizures and loss of consciousness.  Endo/Heme/Allergies: Negative for environmental allergies.  Psychiatric/Behavioral: Negative for depression, suicidal ideas and hallucinations.  All other systems reviewed and are negative.  Physical Exam: Blood pressure (!) 160/90, pulse 83, height 5' 3.5" (1.613 m), weight 75.5 kg (166 lb 6.4 oz), SpO2 99 %. Gen:      No acute distress HEENT:  EOMI, sclera anicteric Neck:     No masses; no thyromegaly Lungs:    Clear to auscultation bilaterally; normal respiratory effort CV:         Regular rate and rhythm; no murmurs Abd:      + bowel sounds; soft, non-tender; no palpable masses, no distension Ext:    No edema; adequate peripheral perfusion Skin:      Warm and dry; no rash Neuro: alert and oriented x 3 Psych: normal mood and affect  Data Reviewed: PET scan 03/22/16-hypermetabolic right paratracheal lymphadenopathy, stable hypermetabolic right thyroid nodule. PET scan 09/12/16- complete resolution of hypermetabolic mediastinal lymphadenopathy, stable hypermetabolic right thyroid nodule. PET scan 03/13/17- 1 cm right lower lobe paratracheal lymph node with positive uptake.  Positive right thyroid nodule.  There are no suspicious lung masses or nodules.  I have reviewed all images personally.  Assessment:  Evaluation of right paratracheal PET positive lymph node History of recurrent endometrial cancer Suspicion for primary lung cancer is low as she is not a significant smoker and there are no suspicious lung findings.  However as there is enough diagnostic uncertainity, given the low CA 125 levels, it would be reasonable to proceed with bronchoscope and biopsy We will schedule her for an EBUS  biopsy as soon as possible All risks, benefits of the procedure were discussed with the patient in detail today and she has agreed to proceed. Check CBC, PT/INR, PTT in preparation for the procedure.  Plan/Recommendations: - Schedule EBUS biopsy - Check CBC, PT/INR, PTT  More then 1/2 the time of the 40 min visit was spent in counseling and/or coordination of care with the patient and family.  Marshell Garfinkel MD  [facility] Pager [phone] 03/28/2017, 11:46 AM  CC: [name], MD

## 2017-03-30 ENCOUNTER — Encounter (HOSPITAL_COMMUNITY): Payer: Self-pay | Admitting: *Deleted

## 2017-04-01 ENCOUNTER — Encounter (HOSPITAL_COMMUNITY): Payer: Self-pay | Admitting: Anesthesiology

## 2017-04-01 NOTE — Anesthesia Preprocedure Evaluation (Addendum)
Anesthesia Evaluation  Patient identified by MRN, date of birth, ID band Patient awake    Reviewed: Allergy & Precautions, NPO status , Patient's Chart, lab work & pertinent test results  History of Anesthesia Complications (+) PONV and history of anesthetic complications  Airway Mallampati: II  TM Distance: >3 FB Neck ROM: Full    Dental  (+) Teeth Intact, Dental Advisory Given   Pulmonary former smoker,    breath sounds clear to auscultation       Cardiovascular hypertension, + Peripheral Vascular Disease   Rhythm:Regular Rate:Normal     Neuro/Psych PSYCHIATRIC DISORDERS Anxiety Depression negative neurological ROS     GI/Hepatic negative GI ROS, Neg liver ROS,   Endo/Other  negative endocrine ROS  Renal/GU negative Renal ROS     Musculoskeletal negative musculoskeletal ROS (+)   Abdominal   Peds  Hematology negative hematology ROS (+)   Anesthesia Other Findings Day of surgery medications reviewed with the patient.  Reproductive/Obstetrics                           Lab Results  Component Value Date   WBC 4.0 03/28/2017   HGB 13.1 03/28/2017   HCT 39.0 03/28/2017   MCV 101.0 (H) 03/28/2017   PLT 185.0 03/28/2017    Anesthesia Physical Anesthesia Plan  ASA: II  Anesthesia Plan: General   Post-op Pain Management:    Induction: Intravenous  PONV Risk Score and Plan: 4 or greater and Ondansetron, Dexamethasone, Midazolam, Scopolamine patch - Pre-op and Treatment may vary due to age or medical condition  Airway Management Planned: Oral ETT  Additional Equipment:   Intra-op Plan:   Post-operative Plan: Extubation in OR  Informed Consent: I have reviewed the patients History and Physical, chart, labs and discussed the procedure including the risks, benefits and alternatives for the proposed anesthesia with the patient or authorized representative who has indicated his/her  understanding and acceptance.   Dental advisory given  Plan Discussed with: CRNA  Anesthesia Plan Comments:        Anesthesia Quick Evaluation

## 2017-04-02 ENCOUNTER — Ambulatory Visit (HOSPITAL_COMMUNITY): Payer: 59

## 2017-04-02 ENCOUNTER — Ambulatory Visit (HOSPITAL_COMMUNITY)
Admission: RE | Admit: 2017-04-02 | Discharge: 2017-04-02 | Disposition: A | Discharge status: Home / Self Care | Payer: 59 | Source: Ambulatory Visit | Attending: Pulmonary Disease | Admitting: Pulmonary Disease

## 2017-04-02 ENCOUNTER — Encounter (HOSPITAL_COMMUNITY): Payer: Self-pay | Admitting: Certified Registered"

## 2017-04-02 ENCOUNTER — Telehealth: Payer: Self-pay | Admitting: Pulmonary Disease

## 2017-04-02 ENCOUNTER — Ambulatory Visit (HOSPITAL_COMMUNITY): Payer: 59 | Admitting: Anesthesiology

## 2017-04-02 ENCOUNTER — Encounter (HOSPITAL_COMMUNITY)
Admission: RE | Disposition: A | Discharge status: Home / Self Care | Payer: Self-pay | Source: Ambulatory Visit | Attending: Pulmonary Disease

## 2017-04-02 DIAGNOSIS — C969 Malignant neoplasm of lymphoid, hematopoietic and related tissue, unspecified: Secondary | ICD-10-CM | POA: Insufficient documentation

## 2017-04-02 DIAGNOSIS — Z7982 Long term (current) use of aspirin: Secondary | ICD-10-CM | POA: Insufficient documentation

## 2017-04-02 DIAGNOSIS — R918 Other nonspecific abnormal finding of lung field: Secondary | ICD-10-CM | POA: Diagnosis not present

## 2017-04-02 DIAGNOSIS — Z87891 Personal history of nicotine dependence: Secondary | ICD-10-CM | POA: Insufficient documentation

## 2017-04-02 DIAGNOSIS — F419 Anxiety disorder, unspecified: Secondary | ICD-10-CM | POA: Insufficient documentation

## 2017-04-02 DIAGNOSIS — R590 Localized enlarged lymph nodes: Secondary | ICD-10-CM

## 2017-04-02 DIAGNOSIS — C541 Malignant neoplasm of endometrium: Secondary | ICD-10-CM | POA: Diagnosis not present

## 2017-04-02 DIAGNOSIS — Z79899 Other long term (current) drug therapy: Secondary | ICD-10-CM | POA: Insufficient documentation

## 2017-04-02 DIAGNOSIS — C771 Secondary and unspecified malignant neoplasm of intrathoracic lymph nodes: Secondary | ICD-10-CM | POA: Diagnosis not present

## 2017-04-02 DIAGNOSIS — R846 Abnormal cytological findings in specimens from respiratory organs and thorax: Secondary | ICD-10-CM | POA: Diagnosis not present

## 2017-04-02 DIAGNOSIS — I10 Essential (primary) hypertension: Secondary | ICD-10-CM | POA: Insufficient documentation

## 2017-04-02 DIAGNOSIS — Z8589 Personal history of malignant neoplasm of other organs and systems: Secondary | ICD-10-CM | POA: Insufficient documentation

## 2017-04-02 DIAGNOSIS — C801 Malignant (primary) neoplasm, unspecified: Secondary | ICD-10-CM | POA: Diagnosis not present

## 2017-04-02 DIAGNOSIS — Z9889 Other specified postprocedural states: Secondary | ICD-10-CM

## 2017-04-02 DIAGNOSIS — F329 Major depressive disorder, single episode, unspecified: Secondary | ICD-10-CM | POA: Insufficient documentation

## 2017-04-02 DIAGNOSIS — I739 Peripheral vascular disease, unspecified: Secondary | ICD-10-CM | POA: Diagnosis not present

## 2017-04-02 DIAGNOSIS — R599 Enlarged lymph nodes, unspecified: Secondary | ICD-10-CM | POA: Diagnosis not present

## 2017-04-02 HISTORY — DX: Nontoxic single thyroid nodule: E04.1

## 2017-04-02 HISTORY — DX: Other specified postprocedural states: R11.2

## 2017-04-02 HISTORY — DX: Depression, unspecified: F32.A

## 2017-04-02 HISTORY — DX: Other specified postprocedural states: Z98.890

## 2017-04-02 HISTORY — DX: Anemia, unspecified: D64.9

## 2017-04-02 HISTORY — DX: Anxiety disorder, unspecified: F41.9

## 2017-04-02 HISTORY — DX: Other nonspecific abnormal finding of lung field: R91.8

## 2017-04-02 HISTORY — DX: Major depressive disorder, single episode, unspecified: F32.9

## 2017-04-02 HISTORY — PX: ENDOBRONCHIAL ULTRASOUND: SHX5096

## 2017-04-02 SURGERY — ENDOBRONCHIAL ULTRASOUND (EBUS)
Anesthesia: General | Laterality: Bilateral

## 2017-04-02 MED ORDER — ROCURONIUM BROMIDE 10 MG/ML (PF) SYRINGE
PREFILLED_SYRINGE | INTRAVENOUS | Status: DC | PRN
Start: 2017-04-02 — End: 2017-04-02
  Administered 2017-04-02: 35 mg via INTRAVENOUS
  Administered 2017-04-02: 10 mg via INTRAVENOUS
  Administered 2017-04-02: 5 mg via INTRAVENOUS

## 2017-04-02 MED ORDER — LIDOCAINE 2% (20 MG/ML) 5 ML SYRINGE
INTRAMUSCULAR | Status: DC | PRN
  Administered 2017-04-02: 100 mg via INTRAVENOUS

## 2017-04-02 MED ORDER — PROPOFOL 10 MG/ML IV BOLUS
INTRAVENOUS | Status: DC | PRN
  Administered 2017-04-02: 180 mg via INTRAVENOUS

## 2017-04-02 MED ORDER — ONDANSETRON HCL 4 MG/2ML IJ SOLN
INTRAMUSCULAR | Status: DC | PRN
  Administered 2017-04-02: 4 mg via INTRAVENOUS

## 2017-04-02 MED ORDER — ROCURONIUM BROMIDE 50 MG/5ML IV SOSY
PREFILLED_SYRINGE | INTRAVENOUS | Status: AC
  Filled 2017-04-02: qty 5

## 2017-04-02 MED ORDER — MIDAZOLAM HCL 2 MG/2ML IJ SOLN
INTRAMUSCULAR | Status: AC
  Filled 2017-04-02: qty 2

## 2017-04-02 MED ORDER — LABETALOL HCL 5 MG/ML IV SOLN
INTRAVENOUS | Status: DC | PRN
  Administered 2017-04-02: 5 mg via INTRAVENOUS

## 2017-04-02 MED ORDER — SUGAMMADEX SODIUM 200 MG/2ML IV SOLN
INTRAVENOUS | Status: DC | PRN
  Administered 2017-04-02: 175 mg via INTRAVENOUS

## 2017-04-02 MED ORDER — ONDANSETRON HCL 4 MG/2ML IJ SOLN
INTRAMUSCULAR | Status: AC
  Filled 2017-04-02: qty 2

## 2017-04-02 MED ORDER — LIDOCAINE 2% (20 MG/ML) 5 ML SYRINGE
INTRAMUSCULAR | Status: AC
  Filled 2017-04-02: qty 5

## 2017-04-02 MED ORDER — PHENYLEPHRINE 40 MCG/ML (10ML) SYRINGE FOR IV PUSH (FOR BLOOD PRESSURE SUPPORT)
PREFILLED_SYRINGE | INTRAVENOUS | Status: DC | PRN
  Administered 2017-04-02: 80 ug via INTRAVENOUS

## 2017-04-02 MED ORDER — MIDAZOLAM HCL 5 MG/5ML IJ SOLN
INTRAMUSCULAR | Status: DC | PRN
  Administered 2017-04-02: 2 mg via INTRAVENOUS

## 2017-04-02 MED ORDER — FENTANYL CITRATE (PF) 100 MCG/2ML IJ SOLN
INTRAMUSCULAR | Status: DC | PRN
  Administered 2017-04-02 (×2): 50 ug via INTRAVENOUS

## 2017-04-02 MED ORDER — SUGAMMADEX SODIUM 200 MG/2ML IV SOLN
INTRAVENOUS | Status: AC
  Filled 2017-04-02: qty 2

## 2017-04-02 MED ORDER — LACTATED RINGERS IV SOLN
INTRAVENOUS | Status: DC
  Administered 2017-04-02: 09:00:00 via INTRAVENOUS
  Administered 2017-04-02: 1000 mL via INTRAVENOUS

## 2017-04-02 MED ORDER — DEXAMETHASONE SODIUM PHOSPHATE 10 MG/ML IJ SOLN
INTRAMUSCULAR | Status: DC | PRN
  Administered 2017-04-02: 10 mg via INTRAVENOUS

## 2017-04-02 MED ORDER — PHENYLEPHRINE 40 MCG/ML (10ML) SYRINGE FOR IV PUSH (FOR BLOOD PRESSURE SUPPORT)
PREFILLED_SYRINGE | INTRAVENOUS | Status: AC
  Filled 2017-04-02: qty 10

## 2017-04-02 MED ORDER — FENTANYL CITRATE (PF) 100 MCG/2ML IJ SOLN
INTRAMUSCULAR | Status: AC
  Filled 2017-04-02: qty 2

## 2017-04-02 MED ORDER — DEXAMETHASONE SODIUM PHOSPHATE 10 MG/ML IJ SOLN
INTRAMUSCULAR | Status: AC
  Filled 2017-04-02: qty 1

## 2017-04-02 MED ORDER — LABETALOL HCL 5 MG/ML IV SOLN
INTRAVENOUS | Status: AC
  Filled 2017-04-02: qty 4

## 2017-04-02 MED ORDER — PROPOFOL 10 MG/ML IV BOLUS
INTRAVENOUS | Status: AC
  Filled 2017-04-02: qty 20

## 2017-04-02 NOTE — Telephone Encounter (Signed)
Spoke to Hosmer with [facility], who states PM has returned call and nothing further is needed. Will close encounter.

## 2017-04-02 NOTE — Anesthesia Postprocedure Evaluation (Signed)
Anesthesia Post Note  Patient: Diane Lozano  Procedure(s) Performed: ENDOBRONCHIAL ULTRASOUND (Bilateral )     Patient location during evaluation: PACU Anesthesia Type: General Level of consciousness: awake and alert Pain management: pain level controlled Vital Signs Assessment: post-procedure vital signs reviewed and stable Respiratory status: spontaneous breathing, nonlabored ventilation, respiratory function stable and patient connected to nasal cannula oxygen Cardiovascular status: blood pressure returned to baseline and stable Postop Assessment: no apparent nausea or vomiting Anesthetic complications: no    Last Vitals:  Vitals:   04/02/17 1045 04/02/17 1050  BP:    Pulse: 65 64  Resp: 15 15  Temp:    SpO2: 95% 94%    Last Pain:  Vitals:   04/02/17 0952  TempSrc: Oral                 Effie Berkshire

## 2017-04-02 NOTE — Telephone Encounter (Signed)
Revision to first note. She needs discharge orders signed and to make sure PM sees patient's chest x-ray.

## 2017-04-02 NOTE — Transfer of Care (Signed)
Immediate Anesthesia Transfer of Care Note  Patient: Diane Lozano  Procedure(s) Performed: ENDOBRONCHIAL ULTRASOUND (Bilateral )  Patient Location: PACU  Anesthesia Type:General  Level of Consciousness: awake, alert  and oriented  Airway & Oxygen Therapy: Patient Spontanous Breathing and Patient connected to face mask oxygen  Post-op Assessment: Report given to RN and Post -op Vital signs reviewed and stable  Post vital signs: Reviewed and stable  Last Vitals:  Vitals:   04/02/17 0642 04/02/17 0952  BP: (!) 155/93   Pulse: 64 78  Resp: 10 18  Temp: 36.8 C 36.8 C  SpO2: 99% 99%    Last Pain:  Vitals:   04/02/17 0952  TempSrc: Oral         Complications: No apparent anesthesia complications

## 2017-04-02 NOTE — Anesthesia Procedure Notes (Signed)
Procedure Name: Intubation Date/Time: 04/02/2017 8:35 AM Performed by: Noralyn Pick D Pre-anesthesia Checklist: Patient identified, Emergency Drugs available, Suction available and Patient being monitored Patient Re-evaluated:Patient Re-evaluated prior to induction Oxygen Delivery Method: Circle system utilized Preoxygenation: Pre-oxygenation with 100% oxygen Induction Type: IV induction Ventilation: Mask ventilation without difficulty Laryngoscope Size: Mac and 3 Grade View: Grade I Tube type: Oral Tube size: 8.5 mm Number of attempts: 1 Airway Equipment and Method: Stylet Placement Confirmation: ETT inserted through vocal cords under direct vision,  positive ETCO2 and breath sounds checked- equal and bilateral Secured at: 21 cm Tube secured with: Tape Dental Injury: Teeth and Oropharynx as per pre-operative assessment

## 2017-04-02 NOTE — Op Note (Addendum)
[facility] Cardiopulmonary Patient Name: Diane Lozano Procedure Date: 04/02/2017 MRN: [id number] Attending MD: Marshell Garfinkel , MD Date of Birth: 06/21/1955 CSN: [id number] Age: 61 Admit Type: Outpatient Ethnicity: Not Hispanic or Latino Procedure:            Bronchoscopy Indications:          Paratracheal adenopathy Providers:            Marshell Garfinkel, MD, William Dalton, Technician, Andre Lefort RRT,RCP Referring MD:          Medicines:            General Anesthesia Complications:        No immediate complications Estimated Blood Loss: Estimated blood loss: Minimal Procedure:      Pre-Anesthesia Assessment:      - A History and Physical has been performed. Patient meds and allergies       have been reviewed. The risks and benefits of the procedure and the       sedation options and risks were discussed with the patient. All       questions were answered and informed consent was obtained. Patient       identification and proposed procedure were verified prior to the       procedure in the pre-procedure area. Mental Status Examination: alert       and oriented. Airway Examination: normal oropharyngeal airway.       Respiratory Examination: clear to auscultation. CV Examination: normal.       ASA Grade Assessment: II - A patient with mild systemic disease. After       reviewing the risks and benefits, the patient was deemed in satisfactory       condition to undergo the procedure. The anesthesia plan was to use       general anesthesia. Immediately prior to administration of medications,       the patient was re-assessed for adequacy to receive sedatives. The heart       rate, respiratory rate, oxygen saturations, blood pressure, adequacy of       pulmonary ventilation, and response to care were monitored throughout       the procedure. The physical status of the patient was re-assessed after       the procedure.      After obtaining  informed consent, the bronchoscope was passed under       direct vision. Throughout the procedure, the patient's blood pressure,       pulse, and oxygen saturations were monitored continuously. the       Bronchoscope was introduced through the mouth, via the endotracheal tube       (the patient was intubated for the procedure) and advanced to the       tracheobronchial tree. Findings:      Erythematous mucosal lesions seen in the trachea in the 3 o clock       position and in the left main stem before the upper lobe take off. Mild       nodularity noted in that area      Endobroncial brushing were done in the areas of abnormal mucosa.      Endobronchial ultrasound was used to assist with fine needle aspiration.       1 cm enlarged LN noted in 4R position. The rest of the LN  stations did       not show any abnormality.      Needle aspiration performed in 4R. Impression:      - Paratracheal adenopathy      - Endobronchial Brushings were obtained.      - Endobronchial ultrasound was performed with fine needle aspiration of       4R lymph node. Moderate Sedation:      Anesthesia was personally administered by an anesthesia professional.       The following parameters were monitored: oxygen saturation, heart rate,       blood pressure, respiratory rate, EKG, adequacy of pulmonary       ventilation, and response to care. Total physician intraservice time was       80 minutes. Recommendation:      - Await biopsy, brushing and cytology results. Procedure Code(s):      --- Professional ---      [phone], Bronchoscopy, rigid or flexible, including fluoroscopic guidance,       when performed; with endobronchial ultrasound (EBUS) guided       transtracheal and/or transbronchial sampling (eg,       aspiration[s]/biopsy[ies]), one or two mediastinal and/or hilar lymph       node stations or structures      49675, Bronchoscopy, rigid or flexible, including fluoroscopic guidance,       when  performed; with brushing or protected brushings Diagnosis Code(s):      --- Professional ---      R91.8, Other nonspecific abnormal finding of lung field      R59.9, Enlarged lymph nodes, unspecified CPT copyright 2016 [employer] Association. All rights reserved. The codes documented in this report are preliminary and upon coder review may  be revised to meet current compliance requirements. Marshell Garfinkel, MD 04/02/2017 1:06:12 PM Number of Addenda: 0 Scope In: Scope Out:

## 2017-04-02 NOTE — Progress Notes (Signed)
Video Bronchoscopy done prior to Ebus procedure  Intervention Bronchial brushings done Procedure tolerated well

## 2017-04-02 NOTE — H&P (Signed)
Patient seen and examined preop area She has no new complaints.  Denies any cough, sputum production, dyspnea  Blood pressure (!) 155/93, pulse 64, temperature 98.3 F (36.8 C), temperature source Oral, resp. rate 10, height 5' 3.5" (1.613 m), weight 75.3 kg (166 lb), SpO2 99 %. Gen:      No acute distress HEENT:  EOMI, sclera anicteric Neck:     No masses; no thyromegaly Lungs:    Clear to auscultation bilaterally; normal respiratory effort CV:         Regular rate and rhythm; no murmurs Abd:      + bowel sounds; soft, non-tender; no palpable masses, no distension Ext:    No edema; adequate peripheral perfusion Skin:      Warm and dry; no rash Neuro: alert and oriented x 3 Psych: normal mood and affect  Labs  03/28/17 CBC- WBC 4, hemoglobin 13.1, platelets 185 PT/INR-10.2/0.9, PTT-26  Assessment/plan: Evaluation of right paratracheal PET positive lymph node History of recurrent endometrial cancer Please see consult note from 03/28/17 for more details  Pt is stable and consented for procedure. Proceed with bronchoscopy with EBUS.  Marshell Garfinkel MD [facility] Pager [phone] If no answer or after 3pm call: [phone] 04/02/2017, 8:10 AM

## 2017-04-04 ENCOUNTER — Encounter (HOSPITAL_COMMUNITY): Payer: Self-pay | Admitting: Pulmonary Disease

## 2017-04-04 ENCOUNTER — Telehealth: Payer: Self-pay | Admitting: *Deleted

## 2017-04-04 NOTE — Telephone Encounter (Signed)
-----   Message from [name], MD sent at 04/04/2017  2:18 PM EDT ----- Regarding: path is back Her biopsy is positive for cancer, likely GYN primary. Has she decided on clinical trial or conventional chemo? I can see her tomorrow at 330 pm if she wants to talk to me. Please place scheduling msg

## 2017-04-04 NOTE — Telephone Encounter (Signed)
Notified of message below. She would like to discuss. Will be here tomorrow at 3:30.   Msg to scheduler

## 2017-04-05 ENCOUNTER — Ambulatory Visit (HOSPITAL_BASED_OUTPATIENT_CLINIC_OR_DEPARTMENT_OTHER): Payer: 59 | Admitting: Hematology and Oncology

## 2017-04-05 DIAGNOSIS — C541 Malignant neoplasm of endometrium: Secondary | ICD-10-CM | POA: Diagnosis not present

## 2017-04-05 DIAGNOSIS — Z7189 Other specified counseling: Secondary | ICD-10-CM

## 2017-04-06 ENCOUNTER — Encounter: Payer: Self-pay | Admitting: Hematology and Oncology

## 2017-04-06 ENCOUNTER — Other Ambulatory Visit: Payer: Self-pay | Admitting: Hematology and Oncology

## 2017-04-06 ENCOUNTER — Telehealth: Payer: Self-pay

## 2017-04-06 DIAGNOSIS — Z7189 Other specified counseling: Secondary | ICD-10-CM | POA: Insufficient documentation

## 2017-04-06 DIAGNOSIS — C541 Malignant neoplasm of endometrium: Secondary | ICD-10-CM

## 2017-04-06 MED ORDER — ONDANSETRON HCL 8 MG PO TABS: 8.0000 mg | ORAL_TABLET | Freq: Two times a day (BID) | ORAL | 1 refills | Status: DC | PRN

## 2017-04-06 MED ORDER — PROCHLORPERAZINE MALEATE 10 MG PO TABS: 10.0000 mg | ORAL_TABLET | Freq: Four times a day (QID) | ORAL | 1 refills | Status: DC | PRN

## 2017-04-06 MED ORDER — LIDOCAINE-PRILOCAINE 2.5-2.5 % EX CREA: TOPICAL_CREAM | CUTANEOUS | 3 refills | Status: DC

## 2017-04-06 MED FILL — ONDANSETRON HCL 8 MG TABLET: 8 | 10 days supply | Qty: 30 | Fill #0

## 2017-04-06 MED FILL — LIDOCAINE-PRILOCAINE CREAM: 2.5-2.5 | 30 days supply | Qty: 30 | Fill #0

## 2017-04-06 MED FILL — PROCHLORPERAZINE 10 MG TAB: 10 | 7 days supply | Qty: 30 | Fill #0

## 2017-04-06 NOTE — Assessment & Plan Note (Addendum)
I had extensive discussion with the patient and her husband We review information from imaging study, recent biopsy and her prior biopsy reports We discussed various treatment options, including standard of care versus clinical trial We discussed the risks, benefits, side effects of conventional standard treatment including combination therapy versus single agent treatment We discussed the risk and benefit of port placement We also explore potential risks and side effects of clinical trial At the end of today, the patient's is undecided and she would like to discuss further with family members I told her to discontinue antiestrogen therapy in view that her most recent biopsy was ER negative According to the patient, her GYN oncologist had added HER-2/neu testing on her previous biopsy sample and she was told that the result was negative We discussed the risk and benefit of adding HER-2/neu testing on her latest biopsy sample The patient is leaning towards getting conventional treatment locally She will call me back before the end of the week for final decision

## 2017-04-06 NOTE — Progress Notes (Signed)
[place] [facility] OFFICE PROGRESS NOTE  Patient Care Team: [name], MD as PCP - General (Family Medicine)  SUMMARY OF ONCOLOGIC HISTORY: Oncology History   [employer] testing done 11-2015 on surgical path from 2014: MS stable TMB low 4 muts/mb ATM [id number] ERBB3 T389K E2H2 rearrangement exon 9 PPP2R1A P179R TP53 [id number]    ER- APPROXIMATELY 25-35% STAINING IN NEOPLASTIC CELLS (INTERMEDIATE)  PR- APPROXIMATELY 25-35% STAINING IN NEOPLASTIC CELLS (STRONG)  Repeat biopsy 04/02/17: ER negative      Endometrial cancer ([place])   06/20/2012 Pathology Results    Biopsy positive for papillary serous carcinoma      06/20/2012 Genetic Testing    [employer] testing done 11-2015 on surgical path from 2014: MS stable TMB low 4 muts/mb ATM [id number] ERBB3 T389K E2H2 rearrangement exon 9 PPP2R1A P179R TP53 I195N       06/20/2012 Initial Diagnosis    Patient presented to PCP with intermittent vaginal bleeding since ~ Oct 2013, endometrial biopsy 06-20-12 with complex endometrial hyperplasia with atypia      06/26/2012 Imaging    Thickened endometrial lining in a postmenopausal patient experiencing vaginal bleeding. In the setting of post-menopausal bleeding, endometrial sampling is indicated to exclude carcinoma. No focal myometrial abnormalities are seen.  Normal left ovary and non-visualized right ovary      07/30/2012 Surgery    Dr. Polly Cobia performed robotic hysterectomy with bilateral salpingo-oophorectomy, bilateral pelvic lymph node dissection and periaortic lymph node dissection. Intraoperatively on frozen section, the patient was noted to have a large endometrial polyp with changes within the polyp consistent for high-grade malignancy, possibly papillary serous carcinoma. There is no obvious extrauterine disease noted.        07/30/2012 Pathology Results    [phone] SUPPLEMENTAL REPORT  THE ENDOMETRIAL CARCINOMA WAS ANALYZED  FOR DNA MISMATCH REPAIR PROTEINS.IMMUNOHISTOCHEMICALLY, THE NEOPLASM RETAINED NUCLEAR EXPRESSION OF 4 GENE PRODUCTS, MLH1, MSH2, MSH6, AND PMS2, INVOLVED IN DNA MISMATCH REPAIR.POSITIVE AND NEGATIVE CONTROLS WORKED APPROPRIATELY.  PER REQUEST, AN ER AND PR ARE PERFORMED ON BLOCK 1I.  ER- APPROXIMATELY 25-35% STAINING IN NEOPLASTIC CELLS (INTERMEDIATE)  PR- APPROXIMATELY 25-35% STAINING IN NEOPLASTIC CELLS (STRONG)  ER AND PR PREDOMINANTLY SHOW STAINING IN THE SEROUS COMPONENT.  DIAGNOSIS  1. UTERUS, CERVIX WITH BILATERAL FALLOPIAN TUBES AND OVARIES,  HYSTERECTOMY AND BILATERAL SALPINGO-OOPHORECTOMY:  HIGH GRADE/POORLY DIFFERENTIATED ENDOMETRIAL ADENOCARCINOMA WITH SOLID AND SEROUS PAPILLARY COMPONENTS.  HISTOPATHOLOGIC TYPE:A VARIETY OF PATTERNS WERE PRESENT, ALL OF WHICH SHOULD BE CONSIDERED TO BE HIGH GRADE.SEROUS PAPILLARY CARCINOMA WAS SEEN OCCURRING ADJACENT TO SOLID ENDOMETRIAL CARCINOMA WITH MARKED ANAPLASIA AND GIANT CELLS. SIZE:TUMOR MEASURED AT LEAST 4.8 CM IN GREATEST HORIZONTAL DIMENSION.  GRADE: POORLY DIFFERENTIATED OR HIGH GRADE. DEPTH OF INVASION: NO DEFINITE MYOMETRIAL INVOLVEMENT WAS SEEN.THE TUMOR APPEARED CONFINED TO THE POLYP AS WELL AS SURFACE ENDOMETRIUM. WHERE MYOMETRIAL THICKNESS NOT APPLICABLE. SEROSAL INVOLVEMENT: NOT DEMONSTRATED.  ENDOCERVICAL INVOLVEMENT:NOT DEMONSTRATED. RESECTION MARGINS: FREE OF INVOLVEMENT. EXTRAUTERINE EXTENSION:NOT DEMONSTRATED. ANGIOLYMPHATIC INVASION: NOT DEFINITELY SEEN. TOTAL NODES EXAMINED:22.  PELVIC NODES EXAMINED:20.  PELVIC NODES INVOLVED:0.  PARA-AORTIC NODES 2. EXAMINED:  PARA-AORTIC NODES 0. INVOLVED TNM STAGE: T1A N0 MX. AJCC STAGE GROUPING: IA. FIGO STAGE:IA.      08/26/2012 Imaging    No CT evidence for intra-abdominal or pelvic metastatic disease. Trace free pelvic fluid, presumably postoperative although the date of  surgery is not documented in the electronic medical record.      08/26/2012 - 12/13/2012 Chemotherapy    She received 6 cycles of carbo/taxol  10/29/2012 - 11/27/2012 Radiation Therapy    May 27, June 5,  June 11, June 19, November 27, 2012: Proximal vagina 30 Gy in 5 fractions        01/17/2013 Imaging    1.  No evidence of recurrent or metastatic disease. 2.  No acute abnormality involving the abdomen or pelvis. 3.  Mild diffuse hepatic steatosis. 4.  Very small supraumbilical midline anterior abdominal wall hernia containing fat, unchanged      01/22/2014 Imaging    No evidence for metastatic or recurrent disease. 2. No bowel obstruction.  Normal appendix. 3. Small fat containing hernia, stable in appearance. 4. Status post hysterectomy and bilateral oophorectomy      02/19/2014 Imaging    No pulmonary lesions are identified. 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 No other sites of metastatic disease identified within the abdomen or pelvis.        11/27/2014 - 02/11/2015 Chemotherapy    She received 4 cycles of carbo/taxol       03/01/2015 PET scan    Single hypermetabolic small retroperitoneal lymph node along the aorta. 2. No evidence of metastatic disease otherwise in the abdomen or pelvis. No evidence local recurrence. 3. Intensely hypermetabolic enlarged nodule adjacent to the RIGHT lobe of thyroid gland. This presumably represents the biopsied lesion in clinician report which was found to be benign thyroid tissue.      04/05/2015 - 05/13/2015 Radiation Therapy    She received 50.4 gray in 28 fractions with simultaneous integrated boost to 56 gray      06/17/2015 Imaging    No acute process or evidence of metastatic disease in the  abdomen or pelvis. Resolution of previously described retroperitoneal adenopathy. 2.  Possible constipation. 3. Atherosclerosis.      06/17/2015 Tumor Marker    Patient's tumor was tested for the following markers: CA125 Results of the tumor marker test revealed 33      08/12/2015 Tumor Marker    Patient's tumor was tested for the following markers: CA125 Results of the tumor marker test revealed 52      08/31/2015 PET scan    Development of right paratracheal hypermetabolic adenopathy, consistent with nodal metastasis. 2. The previously described isolated abdominal retroperitoneal hypermetabolic node has resolved. 3. Persistent hypermetabolic right thyroid nodule, per report previously biopsied. Correlate with those results. 4.  Possible constipation.      09/09/2015 -  Anti-estrogen oral therapy    She has been receiving alternative treatment between megace and tamoxifen      09/09/2015 Tumor Marker    Patient's tumor was tested for the following markers: CA125 Results of the tumor marker test revealed 37.8      09/20/2015 Procedure    Technically successful ultrasound-guided thyroid aspiration biopsy , dominant right nodule      09/20/2015 Pathology Results    THYROID, RIGHT, FINE NEEDLE ASPIRATION (SPECIMEN 1 OF 1 COLLECTED 09/20/15): FINDINGS CONSISTENT WITH BENIGN THYROID NODULE (BETHESDA CATEGORY II).      10/28/2015 Tumor Marker    Patient's tumor was tested for the following markers: CA125 Results of the tumor marker test revealed 53.2      11/04/2015 Imaging    Stable benign right thyroid nodule      12/16/2015 Tumor Marker  Patient's tumor was tested for the following markers: CA125 Results of the tumor marker test revealed 38.1      03/22/2016 PET scan    Interval progression of hypermetabolic right paratracheal lymph node consistent with metastatic involvement. 2. Stable hypermetabolic right thyroid nodule. Reportedly this has been biopsied in the past.       03/23/2016 Tumor Marker    Patient's tumor was tested for the following markers: CA125 Results of the tumor marker test revealed 62.4      04/13/2016 - 06/01/2016 Radiation Therapy    She received 56 Gy to the chest in 28 fractions       05/09/2016 Imaging    No evidence of left lower extremity deep vein thrombosis. No evidence of a superficial thrombosis of the greater and lesser saphenous veins. Positive for thrombus noted in several varicosities of the calf. No evidence of Baker's cyst on the left.      05/17/2016 Tumor Marker    Patient's tumor was tested for the following markers: CA125 Results of the tumor marker test revealed 9      06/29/2016 Tumor Marker    Patient's tumor was tested for the following markers: CA125 Results of the tumor marker test revealed 5.9      08/04/2016 Tumor Marker    Patient's tumor was tested for the following markers: CA125 Results of the tumor marker test revealed 6.0      09/12/2016 PET scan    Complete metabolic response to therapy, with resolution of hypermetabolic mediastinal lymphadenopathy since prior exam. No residual or new metastatic disease identified. Stable hypermetabolic right thyroid lobe nodule, which was previously biopsied on 09/20/2015.      03/13/2017 PET scan    1. Solitary focus of recurrent right paratracheal hypermetabolic activity, with a 1.0 cm right lower paratracheal node having a maximum SUV of 9.0. Appearance compatible with recurrent malignancy. 2. Continued hypermetabolic right thyroid nodule, previously biopsied, presumed benign -correlate with prior biopsy results. 3. Other imaging findings of potential clinical significance: Aortic Atherosclerosis (ICD10-I70.0). Mild cardiomegaly. Prominent stool throughout the colon favors constipation.      04/02/2017 Pathology Results    FINE NEEDLE ASPIRATION, ENDOSCOPIC, (EBUS) 4 R NODE (SPECIMEN 1 OF 2 COLLECTED 04/02/17): MALIGNANT CELLS PRESENT, CONSISTENT WITH  CARCINOMA. SEE COMMENT. COMMENT: THE MALIGNANT CELLS ARE POSITIVE FOR P53 AND NEGATIVE FOR ESTROGEN RECEPTOR AND TTF-1. THIS PROFILE IS NON-SPECIFIC, BUT THE P53 POSITIVE STAINING IS SUGGESTIVE OF GYNECOLOGIC PRIMARY.        INTERVAL HISTORY: Please see below for problem oriented charting. She returns with her husband, Mikki Santee, for further follow-up She tolerated recent biopsy well She had numerous questions related to risk, benefits, side effects of therapy  REVIEW OF SYSTEMS:   Constitutional: Denies fevers, chills or abnormal weight loss Eyes: Denies blurriness of vision Ears, nose, mouth, throat, and face: Denies mucositis or sore throat Respiratory: Denies cough, dyspnea or wheezes Cardiovascular: Denies palpitation, chest discomfort or lower extremity swelling Gastrointestinal:  Denies nausea, heartburn or change in bowel habits Skin: Denies abnormal skin rashes Lymphatics: Denies new lymphadenopathy or easy bruising Neurological:Denies numbness, tingling or new weaknesses Behavioral/Psych: Mood is stable, no new changes  All other systems were reviewed with the patient and are negative.  I have reviewed the past medical history, past surgical history, social history and family history with the patient and they are unchanged from previous note.  ALLERGIES:  has No Known Allergies.  MEDICATIONS:  Current Outpatient Prescriptions  Medication Sig Dispense Refill  .  aspirin EC 81 MG tablet Take 81 mg by mouth daily.    . Calcium Carb-Cholecalciferol 500-600 MG-UNIT TABS Take 1 tablet by mouth daily.     . Cholecalciferol (VITAMIN D3) 2000 units TABS Take 2,000 Units by mouth daily.     Marland Kitchen ibuprofen (ADVIL,MOTRIN) 200 MG tablet Take 200 mg by mouth daily as needed for headache or moderate pain.    . simvastatin (ZOCOR) 40 MG tablet Take 40 mg by mouth every evening.    . tacrolimus (PROTOPIC) 0.1 % ointment Apply 1 application topically daily as needed (rash).     . venlafaxine  XR (EFFEXOR-XR) 75 MG 24 hr capsule Take 75 mg by mouth daily with breakfast.      No current facility-administered medications for this visit.     PHYSICAL EXAMINATION: ECOG PERFORMANCE STATUS: 0 - Asymptomatic  Vitals:   04/05/17 1539  BP: (!) 165/93  Pulse: 97  Resp: 16  Temp: 98 F (36.7 C)   Filed Weights   04/05/17 1539  Weight: 171 lb 1 oz (77.6 kg)    GENERAL:alert, no distress and comfortable SKIN: skin color, texture, turgor are normal, no rashes or significant lesions Musculoskeletal:no cyanosis of digits and no clubbing  NEURO: alert & oriented x 3 with fluent speech, no focal motor/sensory deficits  LABORATORY DATA:  I have reviewed the data as listed    Component Value Date/Time   NA 142 03/13/2017 1112   K 3.9 03/13/2017 1112   CL 102 01/22/2014 0853   CL 105 11/19/2012 0848   CO2 23 03/13/2017 1112   GLUCOSE 87 03/13/2017 1112   GLUCOSE 122 (H) 11/19/2012 0848   BUN 16.0 03/13/2017 1112   CREATININE 0.8 03/13/2017 1112   CALCIUM 9.5 03/13/2017 1112   PROT 7.3 03/13/2017 1112   ALBUMIN 3.9 03/13/2017 1112   AST 20 03/13/2017 1112   ALT 13 03/13/2017 1112   ALKPHOS 40 03/13/2017 1112   BILITOT 0.40 03/13/2017 1112   GFRNONAA >90 01/22/2014 0853   GFRAA >90 01/22/2014 0853    No results found for: SPEP, UPEP  Lab Results  Component Value Date   WBC 4.0 03/28/2017   NEUTROABS 2.4 03/13/2017   HGB 13.1 03/28/2017   HCT 39.0 03/28/2017   MCV 101.0 (H) 03/28/2017   PLT 185.0 03/28/2017      Chemistry      Component Value Date/Time   NA 142 03/13/2017 1112   K 3.9 03/13/2017 1112   CL 102 01/22/2014 0853   CL 105 11/19/2012 0848   CO2 23 03/13/2017 1112   BUN 16.0 03/13/2017 1112   CREATININE 0.8 03/13/2017 1112   GLU 158 (H) 01/14/2015 1035      Component Value Date/Time   CALCIUM 9.5 03/13/2017 1112   ALKPHOS 40 03/13/2017 1112   AST 20 03/13/2017 1112   ALT 13 03/13/2017 1112   BILITOT 0.40 03/13/2017 1112        RADIOGRAPHIC STUDIES: I have personally reviewed the radiological images as listed and agreed with the findings in the report. Dg Chest 1 View  Result Date: 04/02/2017 CLINICAL DATA:  Status post bronchoscopy and biopsy. EXAM: CHEST 1 VIEW COMPARISON:  PET-CT dated March 13, 2017. Chest x-ray dated February 02, 2014. FINDINGS: The heart size is normal. Mild increased density and thickening of the right paratracheal stripe. Normal pulmonary vascularity. No focal consolidation, pleural effusion, or pneumothorax. No acute osseous abnormality. IMPRESSION: 1. Mild increased density and thickening of the right paratracheal stripe, which  may reflect a small amount of postbiopsy hemorrhage. No pneumothorax. Electronically Signed   By: Titus Dubin M.D.   On: 04/02/2017 10:12   Nm Pet Image Restag (ps) Skull Base To Thigh  Result Date: 03/13/2017 CLINICAL DATA:  Subsequent treatment strategy for endometrial cancer. EXAM: NUCLEAR MEDICINE PET SKULL BASE TO THIGH TECHNIQUE: 8.0 mCi F-18 FDG was injected intravenously. Full-ring PET imaging was performed from the skull base to thigh after the radiotracer. CT data was obtained and used for attenuation correction and anatomic localization. FASTING BLOOD GLUCOSE:  Value: 81 mg/dl COMPARISON:  Multiple exams, including 09/12/2016 FINDINGS: NECK No hypermetabolic lymph nodes in the neck. Hypermetabolic right thyroid nodule, maximum SUV 7.8, previously 9.3, correlate with previous biopsy results. CHEST 1.0 cm a right lower paratracheal lymph node on image 63/4 (formerly 0.7 cm) is newly hypermetabolic with maximum SUV 9.0. Right paramediastinal bandlike density compatible with prior radiation therapy. Atherosclerotic calcification of the aortic arch. Mild cardiomegaly. ABDOMEN/PELVIS No abnormal hypermetabolic activity within the liver, pancreas, adrenal glands, or spleen. No hypermetabolic lymph nodes in the abdomen or pelvis. Mild atherosclerotic calcification of  the abdominal aorta. Prominent stool throughout the colon favors constipation. SKELETON Lower lumbar degenerative facet arthropathy. No focal bony hypermetabolic activity to suggest osseous metastatic disease. IMPRESSION: 1. Solitary focus of recurrent right paratracheal hypermetabolic activity, with a 1.0 cm right lower paratracheal node having a maximum SUV of 9.0. Appearance compatible with recurrent malignancy. 2. Continued hypermetabolic right thyroid nodule, previously biopsied, presumed benign -correlate with prior biopsy results. 3. Other imaging findings of potential clinical significance: Aortic Atherosclerosis (ICD10-I70.0). Mild cardiomegaly. Prominent stool throughout the colon favors constipation. Electronically Signed   By: Van Clines M.D.   On: 03/13/2017 13:07    ASSESSMENT & PLAN:  Endometrial cancer ([place]) I had extensive discussion with the patient and her husband We review information from imaging study, recent biopsy and her prior biopsy reports We discussed various treatment options, including standard of care versus clinical trial We discussed the risks, benefits, side effects of conventional standard treatment including combination therapy versus single agent treatment We also explore potential risks and side effects of clinical trial At the end of today, the patient's is undecided and she would like to discuss further with family members I told her to discontinue antiestrogen therapy in view that her most recent biopsy was ER negative According to the patient, her GYN oncologist had added HER-2/neu testing on her previous biopsy sample and she was told that the result was negative We discussed the risk and benefit of adding HER-2/neu testing on her latest biopsy sample The patient is leaning towards getting conventional treatment locally She will call me back before the end of the week for final decision  Goals of care, counseling/discussion We had extensive goals  of care discussion The patient understood that the treatment is strictly palliative in nature   No orders of the defined types were placed in this encounter.  All questions were answered. The patient knows to call the clinic with any problems, questions or concerns. No barriers to learning was detected. I spent 40 minutes counseling the patient face to face. The total time spent in the appointment was 55 minutes and more than 50% was on counseling and review of test results     [name], MD 04/06/2017 9:08 AM

## 2017-04-06 NOTE — Assessment & Plan Note (Signed)
We had extensive goals of care discussion The patient understood that the treatment is strictly palliative in nature

## 2017-04-06 NOTE — Telephone Encounter (Signed)
Patient called and left message to call her. Called back. She would like to try the Doxil treatment. But, if Dr. Alvy Bimler thinks another treatment would be better she will do that treatment.  She would also like to get a port, she said can start treatment without a port if needed.

## 2017-04-06 NOTE — Telephone Encounter (Signed)
1) I placed order for ECHO and port for next week 2) I have placed scheduling msg to start 11/14. I will see her before she starts that day 3) Doxil is a very good choice 4) I have sent prescription of EMLA, zofran prn and compazine prn to [place]. Make sure she knows how to use EMLA.

## 2017-04-06 NOTE — Telephone Encounter (Signed)
Called patient with below message. Verbalized understanding. Instructed on how to use Emla cream after port site healed before treatment.

## 2017-04-06 NOTE — Progress Notes (Signed)
START OFF PATHWAY REGIMEN - Uterine   [id number]:Liposomal Doxorubicin (Doxil) 40 mg/m2 q28 Days:   A cycle is every 28 days:     Liposomal doxorubicin   **Always confirm dose/schedule in your pharmacy ordering system**    Patient Characteristics: Papillary Serous and Clear Cell Histology, Third Line and Beyond AJCC T Category: T1 AJCC N Category: N0 AJCC M Category: M1 AJCC 8 Stage Grouping: IVB Would you be surprised if this patient died  in the next year<= I would be surprised if this patient died in the next year Line of therapy: Third Line and Beyond Intent of Therapy: Curative Intent, Not Discussed with Patient

## 2017-04-09 ENCOUNTER — Telehealth (HOSPITAL_COMMUNITY): Payer: Self-pay | Admitting: Radiology

## 2017-04-09 NOTE — Telephone Encounter (Signed)
Called pt, left VM for her to call to schedule her port-a-cath placement. JM

## 2017-04-10 ENCOUNTER — Telehealth: Payer: Self-pay | Admitting: *Deleted

## 2017-04-10 ENCOUNTER — Other Ambulatory Visit: Payer: Self-pay | Admitting: General Surgery

## 2017-04-10 ENCOUNTER — Other Ambulatory Visit: Payer: Self-pay | Admitting: Student

## 2017-04-10 NOTE — Telephone Encounter (Signed)
Left message with [facility] IR with request below

## 2017-04-10 NOTE — Telephone Encounter (Signed)
-----   Message from [name], MD sent at 04/10/2017 11:18 AM EST ----- Regarding: IR to draw labs Can you get IR to do CBC and CMP tomorrow during port?

## 2017-04-11 ENCOUNTER — Encounter (HOSPITAL_COMMUNITY): Payer: Self-pay

## 2017-04-11 ENCOUNTER — Other Ambulatory Visit: Payer: Self-pay | Admitting: Hematology and Oncology

## 2017-04-11 ENCOUNTER — Ambulatory Visit (HOSPITAL_COMMUNITY)
Admission: RE | Admit: 2017-04-11 | Discharge: 2017-04-11 | Disposition: A | Discharge status: Home / Self Care | Payer: 59 | Source: Ambulatory Visit | Attending: Hematology and Oncology | Admitting: Hematology and Oncology

## 2017-04-11 DIAGNOSIS — Z87891 Personal history of nicotine dependence: Secondary | ICD-10-CM | POA: Diagnosis not present

## 2017-04-11 DIAGNOSIS — C541 Malignant neoplasm of endometrium: Secondary | ICD-10-CM | POA: Insufficient documentation

## 2017-04-11 DIAGNOSIS — E785 Hyperlipidemia, unspecified: Secondary | ICD-10-CM | POA: Insufficient documentation

## 2017-04-11 DIAGNOSIS — Z5111 Encounter for antineoplastic chemotherapy: Secondary | ICD-10-CM | POA: Diagnosis not present

## 2017-04-11 DIAGNOSIS — I10 Essential (primary) hypertension: Secondary | ICD-10-CM | POA: Diagnosis not present

## 2017-04-11 DIAGNOSIS — F419 Anxiety disorder, unspecified: Secondary | ICD-10-CM | POA: Diagnosis not present

## 2017-04-11 DIAGNOSIS — Z452 Encounter for adjustment and management of vascular access device: Secondary | ICD-10-CM | POA: Diagnosis not present

## 2017-04-11 DIAGNOSIS — F329 Major depressive disorder, single episode, unspecified: Secondary | ICD-10-CM | POA: Diagnosis not present

## 2017-04-11 HISTORY — PX: IR US GUIDE VASC ACCESS RIGHT: IMG2390

## 2017-04-11 HISTORY — PX: IR FLUORO GUIDE PORT INSERTION RIGHT: IMG5741

## 2017-04-11 LAB — COMPREHENSIVE METABOLIC PANEL
ALT: 19 U/L (ref 14–54)
AST: 23 U/L (ref 15–41)
Albumin: 3.4 g/dL — ABNORMAL LOW (ref 3.5–5.0)
Alkaline Phosphatase: 41 U/L (ref 38–126)
Anion gap: 5 (ref 5–15)
BUN: 18 mg/dL (ref 6–20)
CO2: 27 mmol/L (ref 22–32)
Calcium: 9 mg/dL (ref 8.9–10.3)
Chloride: 110 mmol/L (ref 101–111)
Creatinine, Ser: 0.8 mg/dL (ref 0.44–1.00)
GFR calc Af Amer: >60 mL/min (ref >60)
GFR calc non Af Amer: >60 mL/min (ref >60)
Glucose, Bld: 90 mg/dL (ref 65–99)
Potassium: 3.5 mmol/L (ref 3.5–5.1)
Sodium: 142 mmol/L (ref 135–145)
Total Bilirubin: 0.5 mg/dL (ref 0.3–1.2)
Total Protein: 6.3 g/dL — ABNORMAL LOW (ref 6.5–8.1)

## 2017-04-11 LAB — CBC
HCT: 35.8 % — ABNORMAL LOW (ref 36.0–46.0)
Hemoglobin: 11.6 g/dL — ABNORMAL LOW (ref 12.0–15.0)
MCH: 32.5 pg (ref 26.0–34.0)
MCHC: 32.4 g/dL (ref 30.0–36.0)
MCV: 100.3 fL — ABNORMAL HIGH (ref 78.0–100.0)
Platelets: 163 10*3/uL (ref 150–400)
RBC: 3.57 MIL/uL — ABNORMAL LOW (ref 3.87–5.11)
RDW: 12.6 % (ref 11.5–15.5)
WBC: 2.6 10*3/uL — ABNORMAL LOW (ref 4.0–10.5)

## 2017-04-11 LAB — APTT: aPTT: 26 seconds (ref 24–36)

## 2017-04-11 LAB — PROTIME-INR
INR: 0.92
Prothrombin Time: 12.3 seconds (ref 11.4–15.2)

## 2017-04-11 MED ORDER — LIDOCAINE HCL 1 % IJ SOLN
INTRAMUSCULAR | Status: AC | PRN
  Administered 2017-04-11: 10 mL

## 2017-04-11 MED ORDER — CEFAZOLIN SODIUM-DEXTROSE 2-4 GM/100ML-% IV SOLN
2.0000 g | INTRAVENOUS | Status: AC
  Administered 2017-04-11: 2 g via INTRAVENOUS

## 2017-04-11 MED ORDER — HEPARIN SOD (PORK) LOCK FLUSH 100 UNIT/ML IV SOLN
INTRAVENOUS | Status: AC
  Filled 2017-04-11: qty 5

## 2017-04-11 MED ORDER — MIDAZOLAM HCL 2 MG/2ML IJ SOLN
INTRAMUSCULAR | Status: AC
  Filled 2017-04-11: qty 4

## 2017-04-11 MED ORDER — CEFAZOLIN SODIUM-DEXTROSE 2-4 GM/100ML-% IV SOLN
INTRAVENOUS | Status: AC
  Filled 2017-04-11: qty 100

## 2017-04-11 MED ORDER — LIDOCAINE HCL 1 % IJ SOLN
INTRAMUSCULAR | Status: AC
  Filled 2017-04-11: qty 20

## 2017-04-11 MED ORDER — FENTANYL CITRATE (PF) 100 MCG/2ML IJ SOLN
INTRAMUSCULAR | Status: AC
  Filled 2017-04-11: qty 4

## 2017-04-11 MED ORDER — SODIUM CHLORIDE 0.9 % IV SOLN: INTRAVENOUS | Status: DC

## 2017-04-11 MED ORDER — FENTANYL CITRATE (PF) 100 MCG/2ML IJ SOLN
INTRAMUSCULAR | Status: AC | PRN
  Administered 2017-04-11 (×2): 50 ug via INTRAVENOUS

## 2017-04-11 MED ORDER — MIDAZOLAM HCL 2 MG/2ML IJ SOLN
INTRAMUSCULAR | Status: AC | PRN
  Administered 2017-04-11 (×2): 1 mg via INTRAVENOUS

## 2017-04-11 NOTE — Progress Notes (Signed)
IV infiltration noted in right arm anitcubital. Dr. Orlene Och [place] notified.  Kelly to come in to see patient.

## 2017-04-11 NOTE — H&P (Signed)
Chief Complaint: endometrial cancer  Referring Physician:Dr. [name]  Supervising Physician: Marybelle Killings  Patient Status: [facility] - Out-pt  HPI: Diane Lozano is a 61 y.o. female who was diagnosed with endometrial carcinoma in 2014.  She has undergone chemotherapy in the past but used a peripheral IV.  She is currently planning to begin a new round of chemotherapy in the next week.  Her oncologist has recommended a Port-A-Cath to be placed.  The patient denies any chest pain, shortness of breath, fevers, or chills.  She has no other complaints.  She presents today for this procedure.  Past Medical History:  Past Medical History:  Diagnosis Date  . Anemia   . Anxiety    hx of  . Depression    hx of  . 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 History of radiation therapy 04/13/16-06/01/16   chest 56 Gy in 28 fractions  . Hx of radiation therapy 5/27, 6/5, 6/11, 6/19, 11/27/2012   proximal vagina 30 Gy in 5 fx, HDR  . Hyperlipidemia   . Hypertension    no current bp meds due to weight loss   . Lung mass   . PONV (postoperative nausea and vomiting)   . Radiation 04/05/15 - 05/13/15   retroperitoneal nodes 50.4 gray boosted to 56 gray  . Thyroid nodule    sees dr Forde Dandy q 6 months for    Past Surgical History:  Past Surgical History:  Procedure Laterality Date  . ABDOMINAL HYSTERECTOMY     robotic hysterectomy with BSO and bilateral pelvic and periaortic node dissection  . CESAREAN SECTION    . CESAREAN SECTION    . COLONOSCOPY  2008    Family History:  Family History  Problem Relation Age of Onset  . Dementia Mother   . Thyroid disease Mother   . Heart disease Father   . Cancer Brother        melanoma involving eye  . Polycystic ovary syndrome Daughter     Social History:  reports that she quit smoking about 35 years ago. Her smoking use included cigarettes. She has a 10.00 pack-year smoking history. she has never used  smokeless tobacco. She reports that she drinks alcohol. She reports that she does not use drugs.  Allergies: No Known Allergies  Medications: Medications reviewed in epic  Please HPI for pertinent positives, otherwise complete 10 system ROS negative.  Mallampati Score: MD Evaluation Airway: WNL Heart: WNL Abdomen: WNL Chest/ Lungs: WNL ASA  Classification: 3 Mallampati/Airway Score: Two  Physical Exam: BP (!) 151/86   Pulse 73   Temp 98.2 F (36.8 C)   Resp 20   Ht 5\' 3"  (1.6 m)   Wt 165 lb (74.8 kg)   SpO2 100%   BMI 29.23 kg/m  Body mass index is 29.23 kg/m. General: pleasant, WD, WN white female who is laying in bed in NAD HEENT: head is normocephalic, atraumatic.  Sclera are noninjected.  PERRL.  Ears and nose without any masses or lesions.  Mouth is pink and moist Heart: regular, rate, and rhythm.  Normal s1,s2. No obvious murmurs, gallops, or rubs noted.  Palpable radial and pedal pulses bilaterally Lungs: CTAB, no wheezes, rhonchi, or rales noted.  Respiratory effort nonlabored Abd: soft, NT, ND, +BS, no masses, hernias, or organomegaly Psych: A&Ox3 with an appropriate affect.   Labs: Results for orders placed or performed during the hospital encounter of 04/11/17 (from the  past 48 hour(s))  APTT upon arrival     Status: None   Collection Time: 04/11/17  8:54 AM  Result Value Ref Range   aPTT 26 24 - 36 seconds  CBC upon arrival     Status: Abnormal   Collection Time: 04/11/17  8:54 AM  Result Value Ref Range   WBC 2.6 (L) 4.0 - 10.5 K/uL   RBC 3.57 (L) 3.87 - 5.11 MIL/uL   Hemoglobin 11.6 (L) 12.0 - 15.0 g/dL   HCT 35.8 (L) 36.0 - 46.0 %   MCV 100.3 (H) 78.0 - 100.0 fL   MCH 32.5 26.0 - 34.0 pg   MCHC 32.4 30.0 - 36.0 g/dL   RDW 12.6 11.5 - 15.5 %   Platelets 163 150 - 400 K/uL  Protime-INR upon arrival     Status: None   Collection Time: 04/11/17  8:54 AM  Result Value Ref Range   Prothrombin Time 12.3 11.4 - 15.2 seconds   INR 0.92      Imaging: No results found.  Assessment/Plan 1.  Endometrial carcinoma  We will plan to proceed with Port-A-Cath placement today.  Her labs and vitals have been reviewed.  Risks and benefits discussed with the patient including, but not limited to bleeding, infection, pneumothorax, or fibrin sheath development and need for additional procedures. All of the patient's questions were answered, patient is agreeable to proceed. Consent signed and in chart.   Thank you for this interesting consult.  I greatly enjoyed meeting Diane Lozano and look forward to participating in their care.  A copy of this report was sent to the requesting provider on this date.  Electronically Signed: Henreitta Cea 04/11/2017, 9:43 AM   I spent a total of  30 Minutes   in face to face in clinical consultation, greater than 50% of which was counseling/coordinating care for endometrial carcinoma

## 2017-04-11 NOTE — Discharge Instructions (Signed)
Implanted Port Insertion, Care After °This sheet gives you information about how to care for yourself after your procedure. Your health care provider may also give you more specific instructions. If you have problems or questions, contact your health care provider. °What can I expect after the procedure? °After your procedure, it is common to have: °· Discomfort at the port insertion site. °· Bruising on the skin over the port. This should improve over 3-4 days. ° °Follow these instructions at home: °Port care °· After your port is placed, you will get a manufacturer's information card. The card has information about your port. Keep this card with you at all times. °· Take care of the port as told by your health care provider. Ask your health care provider if you or a family member can get training for taking care of the port at home. A home health care nurse may also take care of the port. °· Make sure to remember what type of port you have. °Incision care °· Follow instructions from your health care provider about how to take care of your port insertion site. Make sure you: °? Wash your hands with soap and water before you change your bandage (dressing). If soap and water are not available, use hand sanitizer. °? Change your dressing as told by your health care provider. °? Leave stitches (sutures), skin glue, or adhesive strips in place. These skin closures may need to stay in place for 2 weeks or longer. If adhesive strip edges start to loosen and curl up, you may trim the loose edges. Do not remove adhesive strips completely unless your health care provider tells you to do that. °· Check your port insertion site every day for signs of infection. Check for: °? More redness, swelling, or pain. °? More fluid or blood. °? Warmth. °? Pus or a bad smell. °General instructions °· Do not take baths, swim, or use a hot tub until your health care provider approves. °· Do not lift anything that is heavier than 10 lb (4.5  kg) for a week, or as told by your health care provider. °· Ask your health care provider when it is okay to: °? Return to work or school. °? Resume usual physical activities or sports. °· Do not drive for 24 hours if you were given a medicine to help you relax (sedative). °· Take over-the-counter and prescription medicines only as told by your health care provider. °· Wear a medical alert bracelet in case of an emergency. This will tell any health care providers that you have a port. °· Keep all follow-up visits as told by your health care provider. This is important. °Contact a health care provider if: °· You cannot flush your port with saline as directed, or you cannot draw blood from the port. °· You have a fever or chills. °· You have more redness, swelling, or pain around your port insertion site. °· You have more fluid or blood coming from your port insertion site. °· Your port insertion site feels warm to the touch. °· You have pus or a bad smell coming from the port insertion site. °Get help right away if: °· You have chest pain or shortness of breath. °· You have bleeding from your port that you cannot control. °Summary °· Take care of the port as told by your health care provider. °· Change your dressing as told by your health care provider. °· Keep all follow-up visits as told by your health care provider. °  This information is not intended to replace advice given to you by your health care provider. Make sure you discuss any questions you have with your health care provider. °Document Released: 03/12/2013 Document Revised: 04/12/2016 Document Reviewed: 04/12/2016 °Elsevier Interactive Patient Education © 2017 Elsevier Inc. ° °

## 2017-04-11 NOTE — Sedation Documentation (Signed)
Patient is resting comfortably. 

## 2017-04-11 NOTE — Sedation Documentation (Signed)
Patient denies pain and is resting comfortably.  

## 2017-04-11 NOTE — Progress Notes (Signed)
Ferdinand Cava in to see pt.  OK to d/c.  Pt. Instructed to use warm packs to arm and to call radiology if any problems or questions.

## 2017-04-11 NOTE — Procedures (Signed)
RIJV PAC SVC RA EBL 0 Comp 0 

## 2017-04-11 NOTE — Progress Notes (Signed)
Patient had extravasation of medication in her right [facility] during her PAC placement.  She has some edema and tightness of her AC joint as well as into her upper arm.  She has good ROM.  She denies pain.  Palpable +2 radial pulse in right wrist.  No evidence of skin breakdown etc.  She is stable to go home.  She is given our office number to call if she has any questions or concerns such as worsening pain or tightness of her forearm, numbness or tingling in her hand, skin breakdown, blistering, or discoloration of this area.  She understands.  Latanza Pfefferkorn E 1:02 PM 04/11/2017

## 2017-04-12 ENCOUNTER — Ambulatory Visit (HOSPITAL_COMMUNITY)
Admission: RE | Admit: 2017-04-12 | Discharge: 2017-04-12 | Disposition: A | Discharge status: Home / Self Care | Payer: 59 | Source: Ambulatory Visit | Attending: Hematology and Oncology | Admitting: Hematology and Oncology

## 2017-04-12 ENCOUNTER — Telehealth: Payer: Self-pay | Admitting: Hematology and Oncology

## 2017-04-12 DIAGNOSIS — D649 Anemia, unspecified: Secondary | ICD-10-CM | POA: Diagnosis not present

## 2017-04-12 DIAGNOSIS — I080 Rheumatic disorders of both mitral and aortic valves: Secondary | ICD-10-CM | POA: Insufficient documentation

## 2017-04-12 DIAGNOSIS — C541 Malignant neoplasm of endometrium: Secondary | ICD-10-CM | POA: Insufficient documentation

## 2017-04-12 DIAGNOSIS — Z09 Encounter for follow-up examination after completed treatment for conditions other than malignant neoplasm: Secondary | ICD-10-CM | POA: Insufficient documentation

## 2017-04-12 DIAGNOSIS — E785 Hyperlipidemia, unspecified: Secondary | ICD-10-CM | POA: Insufficient documentation

## 2017-04-12 DIAGNOSIS — F419 Anxiety disorder, unspecified: Secondary | ICD-10-CM | POA: Insufficient documentation

## 2017-04-12 DIAGNOSIS — F329 Major depressive disorder, single episode, unspecified: Secondary | ICD-10-CM | POA: Insufficient documentation

## 2017-04-12 DIAGNOSIS — R918 Other nonspecific abnormal finding of lung field: Secondary | ICD-10-CM | POA: Insufficient documentation

## 2017-04-12 DIAGNOSIS — E041 Nontoxic single thyroid nodule: Secondary | ICD-10-CM | POA: Diagnosis not present

## 2017-04-12 DIAGNOSIS — I119 Hypertensive heart disease without heart failure: Secondary | ICD-10-CM | POA: Diagnosis not present

## 2017-04-12 NOTE — Progress Notes (Signed)
  Echocardiogram 2D Echocardiogram has been performed.  Merrie Roof F 04/12/2017, 10:08 AM

## 2017-04-12 NOTE — Telephone Encounter (Signed)
Called patient regarding current schedule. I will mail out a letter

## 2017-04-13 ENCOUNTER — Other Ambulatory Visit: Payer: 59

## 2017-04-18 ENCOUNTER — Telehealth: Payer: Self-pay

## 2017-04-18 ENCOUNTER — Ambulatory Visit (HOSPITAL_BASED_OUTPATIENT_CLINIC_OR_DEPARTMENT_OTHER): Payer: 59

## 2017-04-18 ENCOUNTER — Ambulatory Visit (HOSPITAL_BASED_OUTPATIENT_CLINIC_OR_DEPARTMENT_OTHER): Payer: 59 | Admitting: Hematology and Oncology

## 2017-04-18 ENCOUNTER — Other Ambulatory Visit (HOSPITAL_BASED_OUTPATIENT_CLINIC_OR_DEPARTMENT_OTHER): Payer: 59

## 2017-04-18 ENCOUNTER — Other Ambulatory Visit: Payer: Self-pay | Admitting: Hematology and Oncology

## 2017-04-18 ENCOUNTER — Ambulatory Visit: Payer: 59

## 2017-04-18 ENCOUNTER — Encounter: Payer: Self-pay | Admitting: Hematology and Oncology

## 2017-04-18 DIAGNOSIS — C541 Malignant neoplasm of endometrium: Secondary | ICD-10-CM

## 2017-04-18 DIAGNOSIS — D701 Agranulocytosis secondary to cancer chemotherapy: Secondary | ICD-10-CM | POA: Diagnosis not present

## 2017-04-18 DIAGNOSIS — T451X5A Adverse effect of antineoplastic and immunosuppressive drugs, initial encounter: Secondary | ICD-10-CM

## 2017-04-18 DIAGNOSIS — Z5111 Encounter for antineoplastic chemotherapy: Secondary | ICD-10-CM | POA: Diagnosis not present

## 2017-04-18 DIAGNOSIS — Z95828 Presence of other vascular implants and grafts: Secondary | ICD-10-CM

## 2017-04-18 LAB — CBC WITH DIFFERENTIAL/PLATELET
BASO%: 2.1 % — ABNORMAL HIGH (ref 0.0–2.0)
Basophils Absolute: 0.1 10*3/uL (ref 0.0–0.1)
EOS%: 6.5 % (ref 0.0–7.0)
Eosinophils Absolute: 0.2 10*3/uL (ref 0.0–0.5)
HCT: 34.5 % — ABNORMAL LOW (ref 34.8–46.6)
HGB: 11.7 g/dL (ref 11.6–15.9)
LYMPH%: 20.1 % (ref 14.0–49.7)
MCH: 33.3 pg (ref 25.1–34.0)
MCHC: 34 g/dL (ref 31.5–36.0)
MCV: 98 fL (ref 79.5–101.0)
MONO#: 0.2 10*3/uL (ref 0.1–0.9)
MONO%: 7.1 % (ref 0.0–14.0)
NEUT#: 1.9 10*3/uL (ref 1.5–6.5)
NEUT%: 64.2 % (ref 38.4–76.8)
Platelets: 165 10*3/uL (ref 145–400)
RBC: 3.52 10*6/uL — ABNORMAL LOW (ref 3.70–5.45)
RDW: 12.3 % (ref 11.2–14.5)
WBC: 2.9 10*3/uL — ABNORMAL LOW (ref 3.9–10.3)
lymph#: 0.6 10*3/uL — ABNORMAL LOW (ref 0.9–3.3)

## 2017-04-18 LAB — COMPREHENSIVE METABOLIC PANEL
ALT: 16 U/L (ref 0–55)
AST: 21 U/L (ref 5–34)
Albumin: 3.7 g/dL (ref 3.5–5.0)
Alkaline Phosphatase: 44 U/L (ref 40–150)
Anion Gap: 7 mEq/L (ref 3–11)
BUN: 15.1 mg/dL (ref 7.0–26.0)
CO2: 27 mEq/L (ref 22–29)
Calcium: 9.5 mg/dL (ref 8.4–10.4)
Chloride: 107 mEq/L (ref 98–109)
Creatinine: 0.8 mg/dL (ref 0.6–1.1)
EGFR: >60 mL/min/{1.73_m2} (ref >60)
Glucose: 77 mg/dl (ref 70–140)
Potassium: 3.7 mEq/L (ref 3.5–5.1)
Sodium: 141 mEq/L (ref 136–145)
Total Bilirubin: 0.38 mg/dL (ref 0.20–1.20)
Total Protein: 6.8 g/dL (ref 6.4–8.3)

## 2017-04-18 MED ORDER — DEXAMETHASONE SODIUM PHOSPHATE 10 MG/ML IJ SOLN
10.0000 mg | Freq: Once | INTRAMUSCULAR | Status: AC
  Administered 2017-04-18: 10 mg via INTRAVENOUS

## 2017-04-18 MED ORDER — HEPARIN SOD (PORK) LOCK FLUSH 100 UNIT/ML IV SOLN
500.0000 [IU] | Freq: Once | INTRAVENOUS | Status: AC | PRN
  Administered 2017-04-18: 500 [IU]
  Filled 2017-04-18: qty 5

## 2017-04-18 MED ORDER — DOXORUBICIN HCL LIPOSOMAL CHEMO INJECTION 2 MG/ML: 40.0000 mg/m2 | Freq: Once | INTRAVENOUS | Status: DC

## 2017-04-18 MED ORDER — DOXORUBICIN HCL LIPOSOMAL CHEMO INJECTION 2 MG/ML
38.0000 mg/m2 | Freq: Once | INTRAVENOUS | Status: AC
  Administered 2017-04-18: 70 mg via INTRAVENOUS
  Filled 2017-04-18: qty 25

## 2017-04-18 MED ORDER — DEXAMETHASONE SODIUM PHOSPHATE 10 MG/ML IJ SOLN
INTRAMUSCULAR | Status: AC
Start: 2017-04-18 — End: 2017-04-18
  Filled 2017-04-18: qty 1

## 2017-04-18 MED ORDER — SODIUM CHLORIDE 0.9% FLUSH
10.0000 mL | INTRAVENOUS | Status: DC | PRN
  Administered 2017-04-18: 10 mL
  Filled 2017-04-18: qty 10

## 2017-04-18 MED ORDER — SODIUM CHLORIDE 0.9% FLUSH
10.0000 mL | INTRAVENOUS | Status: DC | PRN
  Administered 2017-04-18: 10 mL via INTRAVENOUS
  Filled 2017-04-18: qty 10

## 2017-04-18 MED ORDER — DEXTROSE 5 % IV SOLN
Freq: Once | INTRAVENOUS | Status: AC
  Administered 2017-04-18: 13:00:00 via INTRAVENOUS

## 2017-04-18 NOTE — Progress Notes (Signed)
Consent signed and written information given on side effect management.

## 2017-04-18 NOTE — Patient Instructions (Signed)
[place] Discharge Instructions for Patients Receiving Chemotherapy  Today you received the following chemotherapy agents doxil.  To help prevent nausea and vomiting after your treatment, we encourage you to take your nausea medication.   If you develop nausea and vomiting that is not controlled by your nausea medication, call the clinic.   BELOW ARE SYMPTOMS THAT SHOULD BE REPORTED IMMEDIATELY:  *FEVER GREATER THAN 100.5 F  *CHILLS WITH OR WITHOUT FEVER  NAUSEA AND VOMITING THAT IS NOT CONTROLLED WITH YOUR NAUSEA MEDICATION  *UNUSUAL SHORTNESS OF BREATH  *UNUSUAL BRUISING OR BLEEDING  TENDERNESS IN MOUTH AND THROAT WITH OR WITHOUT PRESENCE OF ULCERS  *URINARY PROBLEMS  *BOWEL PROBLEMS  UNUSUAL RASH Items with * indicate a potential emergency and should be followed up as soon as possible.  Feel free to call the clinic should you have any questions or concerns. The clinic phone number is [phone].  Please show the [facility] at check-in to the Emergency Department and triage nurse. Doxorubicin Liposomal injection What is this medicine? LIPOSOMAL DOXORUBICIN (LIP oh som al dox oh ROO bi sin) is a chemotherapy drug. This medicine is used to treat many kinds of cancer like Kaposi's sarcoma, multiple myeloma, and ovarian cancer. This medicine may be used for other purposes; ask your health care provider or pharmacist if you have questions. COMMON BRAND NAME(S): Doxil, Lipodox What should I tell my health care provider before I take this medicine? They need to know if you have any of these conditions: -blood disorders -heart disease -infection (especially a virus infection such as chickenpox, cold sores, or herpes) -liver disease -recent or ongoing radiation therapy -an unusual or allergic reaction to doxorubicin, other chemotherapy agents, soybeans, other medicines, foods, dyes, or preservatives -pregnant or trying to get  pregnant -breast-feeding How should I use this medicine? This drug is given as an infusion into a vein. It is administered in a hospital or clinic by a specially trained health care professional. If you have pain, swelling, burning or any unusual feeling around the site of your injection, tell your health care professional right away. Talk to your pediatrician regarding the use of this medicine in children. Special care may be needed. Overdosage: If you think you have taken too much of this medicine contact a poison control center or emergency room at once. NOTE: This medicine is only for you. Do not share this medicine with others. What if I miss a dose? It is important not to miss your dose. Call your doctor or health care professional if you are unable to keep an appointment. What may interact with this medicine? Do not take this medicine with any of the following medications: -zidovudine This medicine may also interact with the following medications: -medicines to increase blood counts like filgrastim, pegfilgrastim, sargramostim -vaccines Talk to your doctor or health care professional before taking any of these medicines: -acetaminophen -aspirin -ibuprofen -ketoprofen -naproxen This list may not describe all possible interactions. Give your health care provider a list of all the medicines, herbs, non-prescription drugs, or dietary supplements you use. Also tell them if you smoke, drink alcohol, or use illegal drugs. Some items may interact with your medicine. What should I watch for while using this medicine? Your condition will be monitored carefully while you are receiving this medicine. You will need important blood work done while you are taking this medicine. This drug may make you feel generally unwell. This is not uncommon, as chemotherapy can affect healthy cells  as well as cancer cells. Report any side effects. Continue your course of treatment even though you feel ill unless  your doctor tells you to stop. Your urine may turn orange-red for a few days after your dose. This is not blood. If your urine is dark or brown, call your doctor. In some cases, you may be given additional medicines to help with side effects. Follow all directions for their use. Call your doctor or health care professional for advice if you get a fever (100.5 degrees F or higher), chills or sore throat, or other symptoms of a cold or flu. Do not treat yourself. This drug decreases your body's ability to fight infections. Try to avoid being around people who are sick. This medicine may increase your risk to bruise or bleed. Call your doctor or health care professional if you notice any unusual bleeding. Be careful brushing and flossing your teeth or using a toothpick because you may get an infection or bleed more easily. If you have any dental work done, tell your dentist you are receiving this medicine. Avoid taking products that contain aspirin, acetaminophen, ibuprofen, naproxen, or ketoprofen unless instructed by your doctor. These medicines may hide a fever. Men and women of childbearing age should use effective birth control methods while using taking this medicine. Do not become pregnant while taking this medicine. There is a potential for serious side effects to an unborn child. Talk to your health care professional or pharmacist for more information. Do not breast-feed an infant while taking this medicine. Talk to your doctor about your risk of cancer. You may be more at risk for certain types of cancers if you take this medicine. What side effects may I notice from receiving this medicine? Side effects that you should report to your doctor or health care professional as soon as possible: -allergic reactions like skin rash, itching or hives, swelling of the face, lips, or tongue -low blood counts - this medicine may decrease the number of white blood cells, red blood cells and platelets. You may  be at increased risk for infections and bleeding. -signs of hand-foot syndrome - tingling or burning, redness, flaking, swelling, small blisters, or small sores on the palms of your hands or the soles of your feet -signs of infection - fever or chills, cough, sore throat, pain or difficulty passing urine -signs of decreased platelets or bleeding - bruising, pinpoint red spots on the skin, black, tarry stools, blood in the urine -signs of decreased red blood cells - unusually weak or tired, fainting spells, lightheadedness -back pain, chills, facial flushing, fever, headache, tightness in the chest or throat during the infusion -breathing problems -chest pain -fast, irregular heartbeat -mouth pain, redness, sores -pain, swelling, redness at site where injected -pain, tingling, numbness in the hands or feet -swelling of ankles, feet, or hands -vomiting Side effects that usually do not require medical attention (report to your doctor or health care professional if they continue or are bothersome): -diarrhea -hair loss -loss of appetite -nail discoloration or damage -nausea -red or watery eyes -red colored urine -stomach upset This list may not describe all possible side effects. Call your doctor for medical advice about side effects. You may report side effects to FDA at 1-800-FDA-1088. Where should I keep my medicine? This drug is given in a hospital or clinic and will not be stored at home. NOTE: This sheet is a summary. It may not cover all possible information. If you have questions about this medicine,  talk to your doctor, pharmacist, or health care provider.  2018 Elsevier/Gold Standard (2012-02-09 10:12:56)

## 2017-04-18 NOTE — Telephone Encounter (Signed)
Printed avs and calender for upcoming appointment for 3 months per 11/14 los/ every 4 weeks

## 2017-04-19 ENCOUNTER — Telehealth: Payer: Self-pay

## 2017-04-19 NOTE — Telephone Encounter (Signed)
-----   Message from Ignacia Felling, RN sent at 04/18/2017 12:17 PM EST ----- Regarding: chemo follow up call/Dr. Michelle Piper: [phone] 1st doxil  Dr. Alvy Bimler   Patient phone [phone]

## 2017-04-19 NOTE — Telephone Encounter (Signed)
-----  Message from [name], MD sent at 04/19/2017  9:57 AM EST ----- Regarding: please call pathologist to add Her2 neu test Accession: [id number] Sample was taken 10/29

## 2017-04-19 NOTE — Telephone Encounter (Signed)
Called pathology with below message. 

## 2017-04-19 NOTE — Assessment & Plan Note (Addendum)
I have reviewed results of echocardiogram with the patient I discussed with the patient and reviewed the current guidelines We discussed various options and finally she agreed to proceed with the treatment. She understood that the goal of treatment is palliative The treatment is approved based on the following publication:  Gynecol Oncol. 2005 Aug;98(2):294-8. Phase II trial of liposomal doxorubicin at 40 mg/m(2) every 4 weeks in endometrial carcinoma: a Gynecologic Oncology Group Study. [place] HD1, Blessing JA, Sorosky Mindi Curling, [place]. Abstract OBJECTIVE:  In patients with disseminated endometrial carcinoma, liposomal doxorubicin has possible advantages over doxorubicin which has proven single agent activity but well known cardiac toxicity. Before replacing doxorubicin in clinical trials, the Gynecologic Oncology Group (GOG) decided to conduct a phase II clinical trial of liposomal doxorubicin ([place]azie., [place]) in first-line therapy of patients with disseminated endometrial carcinoma. METHODS:  Patients with initial histologic confirmation of endometrial carcinoma presenting with disseminated or recurrent cancer who had not previously received cytotoxic drugs were considered for participation in this clinical trial. Eligible patients had measurable disease, GOG performance status 0-2, and adequate bone marrow, renal, and hepatic function according to standard criteria. Liposomal doxorubicin 40 mg/m(2) was given by intravenous injection on an every 4-week cycle until toxicity or progression. Patients who remained free from tumor progression or intolerable toxicity received at least one to a maximum of 20 cycles of liposomal doxorubicin. RESULTS:  Fifty-six patients were registered, of whom three were determined ineligible (prior malignancy = 2, inadequate pathology material = 1). One patient never received therapy, leaving 52 evaluable patients. Two patients (3.8%)  achieved a complete response, four (7.7%) exhibited a partial response, and 31 (59.7%) had stable disease. The most common adverse events were constitutional (32/52), anemia (28/52), pain (27/52), dermatologic (25/52), and cardiovascular (12/52). CONCLUSIONS:  In this trial, liposomal doxorubicin had a response rate of 11.5% in first-line treatment of disseminated endometrial carcinoma when given at 40 mg/m(2) every 4 weeks. In view of the associated skin toxicity at this dose, liposomal doxorubicin does not appear to be a suitable replacement for the more active doxorubicin for therapy of endometrial carcinoma.  We discussed some of the risks, benefits and side-effects of Doxorubcin   Some of the short term side-effects included, though not limited to, risk of fatigue, weight loss, risk of allergic reactions, pancytopenia, life-threatening infections, need for transfusions of blood products, nausea, vomiting, change in bowel habits, hair loss, risk of congestive heart failure, admission to hospital for various reasons, and risks of death.   Long term side-effects are also discussed including permanent damage to nerve function, chronic fatigue, and rare secondary malignancy including bone marrow disorders.   The patient is aware that the response rates discussed earlier is not guaranteed.    After a long discussion, patient made an informed decision to proceed with the prescribed plan of care.  With her permission permission, I will ask pathologist to add HER-2/neu testing on her tissue sample

## 2017-04-19 NOTE — Progress Notes (Signed)
[place] OFFICE PROGRESS NOTE  Patient Care Team: [name], MD as PCP - General (Family Medicine)  SUMMARY OF ONCOLOGIC HISTORY: Oncology History   [employer] testing done 11-2015 on surgical path from 2014: MS stable TMB low 4 muts/mb ATM [id number] ERBB3 T389K E2H2 rearrangement exon 9 PPP2R1A P179R TP53 I195N    ER- APPROXIMATELY 25-35% STAINING IN NEOPLASTIC CELLS (INTERMEDIATE)  PR- APPROXIMATELY 25-35% STAINING IN NEOPLASTIC CELLS (STRONG)  Repeat biopsy 04/02/17: ER negative      Endometrial cancer ([place])   06/20/2012 Pathology Results    Biopsy positive for papillary serous carcinoma      06/20/2012 Genetic Testing    [employer] testing done 11-2015 on surgical path from 2014: MS stable TMB low 4 muts/mb ATM [id number] ERBB3 T389K E2H2 rearrangement exon 9 PPP2R1A P179R TP53 I195N       06/20/2012 Initial Diagnosis    Patient presented to PCP with intermittent vaginal bleeding since ~ Oct 2013, endometrial biopsy 06-20-12 with complex endometrial hyperplasia with atypia      06/26/2012 Imaging    Thickened endometrial lining in a postmenopausal patient experiencing vaginal bleeding. In the setting of post-menopausal bleeding, endometrial sampling is indicated to exclude carcinoma. No focal myometrial abnormalities are seen.  Normal left ovary and non-visualized right ovary      07/30/2012 Surgery    Dr. Polly Cobia performed robotic hysterectomy with bilateral salpingo-oophorectomy, bilateral pelvic lymph node dissection and periaortic lymph node dissection. Intraoperatively on frozen section, the patient was noted to have a large endometrial polyp with changes within the polyp consistent for high-grade malignancy, possibly papillary serous carcinoma. There is no obvious extrauterine disease noted.        07/30/2012 Pathology Results    [phone] SUPPLEMENTAL REPORT  THE ENDOMETRIAL CARCINOMA WAS ANALYZED  FOR DNA MISMATCH REPAIR PROTEINS.IMMUNOHISTOCHEMICALLY, THE NEOPLASM RETAINED NUCLEAR EXPRESSION OF 4 GENE PRODUCTS, MLH1, MSH2, MSH6, AND PMS2, INVOLVED IN DNA MISMATCH REPAIR.POSITIVE AND NEGATIVE CONTROLS WORKED APPROPRIATELY.  PER REQUEST, AN ER AND PR ARE PERFORMED ON BLOCK 1I.  ER- APPROXIMATELY 25-35% STAINING IN NEOPLASTIC CELLS (INTERMEDIATE)  PR- APPROXIMATELY 25-35% STAINING IN NEOPLASTIC CELLS (STRONG)  ER AND PR PREDOMINANTLY SHOW STAINING IN THE SEROUS COMPONENT.  DIAGNOSIS  1. UTERUS, CERVIX WITH BILATERAL FALLOPIAN TUBES AND OVARIES,  HYSTERECTOMY AND BILATERAL SALPINGO-OOPHORECTOMY:  HIGH GRADE/POORLY DIFFERENTIATED ENDOMETRIAL ADENOCARCINOMA WITH SOLID AND SEROUS PAPILLARY COMPONENTS.  HISTOPATHOLOGIC TYPE:A VARIETY OF PATTERNS WERE PRESENT, ALL OF WHICH SHOULD BE CONSIDERED TO BE HIGH GRADE.SEROUS PAPILLARY CARCINOMA WAS SEEN OCCURRING ADJACENT TO SOLID ENDOMETRIAL CARCINOMA WITH MARKED ANAPLASIA AND GIANT CELLS. SIZE:TUMOR MEASURED AT LEAST 4.8 CM IN GREATEST HORIZONTAL DIMENSION.  GRADE: POORLY DIFFERENTIATED OR HIGH GRADE. DEPTH OF INVASION: NO DEFINITE MYOMETRIAL INVOLVEMENT WAS SEEN.THE TUMOR APPEARED CONFINED TO THE POLYP AS WELL AS SURFACE ENDOMETRIUM. WHERE MYOMETRIAL THICKNESS NOT APPLICABLE. SEROSAL INVOLVEMENT: NOT DEMONSTRATED.  ENDOCERVICAL INVOLVEMENT:NOT DEMONSTRATED. RESECTION MARGINS: FREE OF INVOLVEMENT. EXTRAUTERINE EXTENSION:NOT DEMONSTRATED. ANGIOLYMPHATIC INVASION: NOT DEFINITELY SEEN. TOTAL NODES EXAMINED:22.  PELVIC NODES EXAMINED:20.  PELVIC NODES INVOLVED:0.  PARA-AORTIC NODES 2. EXAMINED:  PARA-AORTIC NODES 0. INVOLVED TNM STAGE: T1A N0 MX. AJCC STAGE GROUPING: IA. FIGO STAGE:IA.      08/26/2012 Imaging    No CT evidence for intra-abdominal or pelvic metastatic disease. Trace free pelvic fluid, presumably postoperative although the date of  surgery is not documented in the electronic medical record.      08/26/2012 - 12/13/2012 Chemotherapy    She received 6 cycles of carbo/taxol  10/29/2012 - 11/27/2012 Radiation Therapy    May 27, June 5,  June 11, June 19, November 27, 2012: Proximal vagina 30 Gy in 5 fractions        01/17/2013 Imaging    1.  No evidence of recurrent or metastatic disease. 2.  No acute abnormality involving the abdomen or pelvis. 3.  Mild diffuse hepatic steatosis. 4.  Very small supraumbilical midline anterior abdominal wall hernia containing fat, unchanged      01/22/2014 Imaging    No evidence for metastatic or recurrent disease. 2. No bowel obstruction.  Normal appendix. 3. Small fat containing hernia, stable in appearance. 4. Status post hysterectomy and bilateral oophorectomy      02/19/2014 Imaging    No pulmonary lesions are identified. 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 No other sites of metastatic disease identified within the abdomen or pelvis.        11/27/2014 - 02/11/2015 Chemotherapy    She received 4 cycles of carbo/taxol       03/01/2015 PET scan    Single hypermetabolic small retroperitoneal lymph node along the aorta. 2. No evidence of metastatic disease otherwise in the abdomen or pelvis. No evidence local recurrence. 3. Intensely hypermetabolic enlarged nodule adjacent to the RIGHT lobe of thyroid gland. This presumably represents the biopsied lesion in clinician report which was found to be benign thyroid tissue.      04/05/2015 - 05/13/2015 Radiation Therapy    She received 50.4 gray in 28 fractions with simultaneous integrated boost to 56 gray      06/17/2015 Imaging    No acute process or evidence of metastatic disease in the  abdomen or pelvis. Resolution of previously described retroperitoneal adenopathy. 2.  Possible constipation. 3. Atherosclerosis.      06/17/2015 Tumor Marker    Patient's tumor was tested for the following markers: CA125 Results of the tumor marker test revealed 33      08/12/2015 Tumor Marker    Patient's tumor was tested for the following markers: CA125 Results of the tumor marker test revealed 52      08/31/2015 PET scan    Development of right paratracheal hypermetabolic adenopathy, consistent with nodal metastasis. 2. The previously described isolated abdominal retroperitoneal hypermetabolic node has resolved. 3. Persistent hypermetabolic right thyroid nodule, per report previously biopsied. Correlate with those results. 4.  Possible constipation.      09/09/2015 -  Anti-estrogen oral therapy    She has been receiving alternative treatment between megace and tamoxifen      09/09/2015 Tumor Marker    Patient's tumor was tested for the following markers: CA125 Results of the tumor marker test revealed 37.8      09/20/2015 Procedure    Technically successful ultrasound-guided thyroid aspiration biopsy , dominant right nodule      09/20/2015 Pathology Results    THYROID, RIGHT, FINE NEEDLE ASPIRATION (SPECIMEN 1 OF 1 COLLECTED 09/20/15): FINDINGS CONSISTENT WITH BENIGN THYROID NODULE (BETHESDA CATEGORY II).      10/28/2015 Tumor Marker    Patient's tumor was tested for the following markers: CA125 Results of the tumor marker test revealed 53.2      11/04/2015 Imaging    Stable benign right thyroid nodule      12/16/2015 Tumor Marker  Patient's tumor was tested for the following markers: CA125 Results of the tumor marker test revealed 38.1      03/22/2016 PET scan    Interval progression of hypermetabolic right paratracheal lymph node consistent with metastatic involvement. 2. Stable hypermetabolic right thyroid nodule. Reportedly this has been biopsied in the past.       03/23/2016 Tumor Marker    Patient's tumor was tested for the following markers: CA125 Results of the tumor marker test revealed 62.4      04/13/2016 - 06/01/2016 Radiation Therapy    She received 56 Gy to the chest in 28 fractions       05/09/2016 Imaging    No evidence of left lower extremity deep vein thrombosis. No evidence of a superficial thrombosis of the greater and lesser saphenous veins. Positive for thrombus noted in several varicosities of the calf. No evidence of Baker's cyst on the left.      05/17/2016 Tumor Marker    Patient's tumor was tested for the following markers: CA125 Results of the tumor marker test revealed 9      06/29/2016 Tumor Marker    Patient's tumor was tested for the following markers: CA125 Results of the tumor marker test revealed 5.9      08/04/2016 Tumor Marker    Patient's tumor was tested for the following markers: CA125 Results of the tumor marker test revealed 6.0      09/12/2016 PET scan    Complete metabolic response to therapy, with resolution of hypermetabolic mediastinal lymphadenopathy since prior exam. No residual or new metastatic disease identified. Stable hypermetabolic right thyroid lobe nodule, which was previously biopsied on 09/20/2015.      03/13/2017 PET scan    1. Solitary focus of recurrent right paratracheal hypermetabolic activity, with a 1.0 cm right lower paratracheal node having a maximum SUV of 9.0. Appearance compatible with recurrent malignancy. 2. Continued hypermetabolic right thyroid nodule, previously biopsied, presumed benign -correlate with prior biopsy results. 3. Other imaging findings of potential clinical significance: Aortic Atherosclerosis (ICD10-I70.0). Mild cardiomegaly. Prominent stool throughout the colon favors constipation.      04/02/2017 Pathology Results    FINE NEEDLE ASPIRATION, ENDOSCOPIC, (EBUS) 4 R NODE (SPECIMEN 1 OF 2 COLLECTED 04/02/17): MALIGNANT CELLS PRESENT, CONSISTENT WITH  CARCINOMA. SEE COMMENT. COMMENT: THE MALIGNANT CELLS ARE POSITIVE FOR P53 AND NEGATIVE FOR ESTROGEN RECEPTOR AND TTF-1. THIS PROFILE IS NON-SPECIFIC, BUT THE P53 POSITIVE STAINING IS SUGGESTIVE OF GYNECOLOGIC PRIMARY.       04/11/2017 Procedure    Successful 8 French right internal jugular vein power port placement with its tip at the SVC/RA junction      04/12/2017 Imaging    Normal LV size with mild LV hypertrophy. EF 55-60%. Normal RV size and systolic function. Aortic valve sclerosis without significant stenosis.       INTERVAL HISTORY: Please see below for problem oriented charting. She returns for further follow-up She tolerated port placement well She denies cough, chest pain or shortness of breath  REVIEW OF SYSTEMS:   Constitutional: Denies fevers, chills or abnormal weight loss Eyes: Denies blurriness of vision Ears, nose, mouth, throat, and face: Denies mucositis or sore throat Respiratory: Denies cough, dyspnea or wheezes Cardiovascular: Denies palpitation, chest discomfort or lower extremity swelling Gastrointestinal:  Denies nausea, heartburn or change in bowel habits Skin: Denies abnormal skin rashes Lymphatics: Denies new lymphadenopathy or easy bruising Neurological:Denies numbness, tingling or new weaknesses Behavioral/Psych: Mood is stable, no new changes  All other systems were  reviewed with the patient and are negative.  I have reviewed the past medical history, past surgical history, social history and family history with the patient and they are unchanged from previous note.  ALLERGIES:  has No Known Allergies.  MEDICATIONS:  Current Outpatient Medications  Medication Sig Dispense Refill  . aspirin EC 81 MG tablet Take 81 mg by mouth daily.    . Calcium Carb-Cholecalciferol 500-600 MG-UNIT TABS Take 1 tablet by mouth daily.     . Cholecalciferol (VITAMIN D3) 2000 units TABS Take 2,000 Units by mouth daily.     Marland Kitchen ibuprofen (ADVIL,MOTRIN) 200 MG tablet  Take 200 mg by mouth daily as needed for headache or moderate pain.    Marland Kitchen lidocaine-prilocaine (EMLA) cream Apply to affected area once 30 g 3  . ondansetron (ZOFRAN) 8 MG tablet Take 1 tablet (8 mg total) by mouth 2 (two) times daily as needed (Nausea or vomiting). 30 tablet 1  . prochlorperazine (COMPAZINE) 10 MG tablet Take 1 tablet (10 mg total) by mouth every 6 (six) hours as needed (Nausea or vomiting). 30 tablet 1  . simvastatin (ZOCOR) 40 MG tablet Take 40 mg by mouth every evening.    . tacrolimus (PROTOPIC) 0.1 % ointment Apply 1 application topically daily as needed (rash).     . venlafaxine XR (EFFEXOR-XR) 75 MG 24 hr capsule Take 75 mg by mouth daily with breakfast.      No current facility-administered medications for this visit.     PHYSICAL EXAMINATION: ECOG PERFORMANCE STATUS: 0 - Asymptomatic  Vitals:   04/18/17 1100  BP: (!) 177/99  Pulse: 78  Resp: 18  Temp: 98.3 F (36.8 C)  SpO2: 100%   Filed Weights   04/18/17 1100  Weight: 163 lb 14.4 oz (74.3 kg)    GENERAL:alert, no distress and comfortable SKIN: skin color, texture, turgor are normal, no rashes or significant lesions EYES: normal, Conjunctiva are pink and non-injected, sclera clear Musculoskeletal:no cyanosis of digits and no clubbing  NEURO: alert & oriented x 3 with fluent speech, no focal motor/sensory deficits  LABORATORY DATA:  I have reviewed the data as listed    Component Value Date/Time   NA 141 04/18/2017 1030   K 3.7 04/18/2017 1030   CL 110 04/11/2017 0854   CL 105 11/19/2012 0848   CO2 27 04/18/2017 1030   GLUCOSE 77 04/18/2017 1030   GLUCOSE 122 (H) 11/19/2012 0848   BUN 15.1 04/18/2017 1030   CREATININE 0.8 04/18/2017 1030   CALCIUM 9.5 04/18/2017 1030   PROT 6.8 04/18/2017 1030   ALBUMIN 3.7 04/18/2017 1030   AST 21 04/18/2017 1030   ALT 16 04/18/2017 1030   ALKPHOS 44 04/18/2017 1030   BILITOT 0.38 04/18/2017 1030   GFRNONAA >60 04/11/2017 0854   GFRAA >60 04/11/2017  0854    No results found for: SPEP, UPEP  Lab Results  Component Value Date   WBC 2.9 (L) 04/18/2017   NEUTROABS 1.9 04/18/2017   HGB 11.7 04/18/2017   HCT 34.5 (L) 04/18/2017   MCV 98.0 04/18/2017   PLT 165 04/18/2017      Chemistry      Component Value Date/Time   NA 141 04/18/2017 1030   K 3.7 04/18/2017 1030   CL 110 04/11/2017 0854   CL 105 11/19/2012 0848   CO2 27 04/18/2017 1030   BUN 15.1 04/18/2017 1030   CREATININE 0.8 04/18/2017 1030   GLU 158 (H) 01/14/2015 1035      Component  Value Date/Time   CALCIUM 9.5 04/18/2017 1030   ALKPHOS 44 04/18/2017 1030   AST 21 04/18/2017 1030   ALT 16 04/18/2017 1030   BILITOT 0.38 04/18/2017 1030       RADIOGRAPHIC STUDIES: I have personally reviewed the radiological images as listed and agreed with the findings in the report. Dg Chest 1 View  Result Date: 04/02/2017 CLINICAL DATA:  Status post bronchoscopy and biopsy. EXAM: CHEST 1 VIEW COMPARISON:  PET-CT dated March 13, 2017. Chest x-ray dated February 02, 2014. FINDINGS: The heart size is normal. Mild increased density and thickening of the right paratracheal stripe. Normal pulmonary vascularity. No focal consolidation, pleural effusion, or pneumothorax. No acute osseous abnormality. IMPRESSION: 1. Mild increased density and thickening of the right paratracheal stripe, which may reflect a small amount of postbiopsy hemorrhage. No pneumothorax. Electronically Signed   By: Titus Dubin M.D.   On: 04/02/2017 10:12   Ir US Guide Vasc Access Right  Result Date: 04/11/2017 CLINICAL DATA:  Endometrial carcinoma EXAM: TUNNEL POWER PORT PLACEMENT WITH SUBCUTANEOUS POCKET UTILIZING ULTRASOUND & FLOUROSCOPY FLUOROSCOPY TIME:  24 seconds.  Two mGy. MEDICATIONS AND MEDICAL HISTORY: Versed 2 mg, Fentanyl 100 mcg. Additional Medications: Ancef 2 g. Antibiotics were given within 2 hours of the procedure. ANESTHESIA/SEDATION: Moderate sedation time: 38 minutes. Nursing monitored the the  patient during the procedure. PROCEDURE: After written informed consent was obtained, patient was placed in the supine position on angiographic table. The right neck and chest was prepped and draped in a sterile fashion. Lidocaine was utilized for local anesthesia. The right jugular vein was noted to be patent initially with ultrasound. Under sonographic guidance, a micropuncture needle was inserted into the right IJ vein (Ultrasound and fluoroscopic image documentation was performed). The needle was removed over an 018 wire which was exchanged for a Amplatz. This was advanced into the IVC. An 8-French dilator was advanced over the Amplatz. A small incision was made in the right upper chest over the anterior right second rib. Utilizing blunt dissection, a subcutaneous pocket was created in the caudal direction. The pocket was irrigated with a copious amount of sterile normal saline. The port catheter was tunneled from the chest incision, and out the neck incision. The reservoir was inserted into the subcutaneous pocket and secured with two 3-0 Ethilon stitches. A peel-away sheath was advanced over the Amplatz wire. The port catheter was cut to measure length and inserted through the peel-away sheath. The peel-away sheath was removed. The chest incision was closed with 3-0 Vicryl interrupted stitches for the subcutaneous tissue and a running of 4-0 Vicryl subcuticular stitch for the skin. The neck incision was closed with a 4-0 Vicryl subcuticular stitch. Derma-bond was applied to both surgical incisions. The port reservoir was flushed and instilled with heparinized saline. No complications. FINDINGS: A right IJ vein Port-A-Cath is in place with its tip at the cavoatrial junction. COMPLICATIONS: None IMPRESSION: Successful 8 French right internal jugular vein power port placement with its tip at the SVC/RA junction. Electronically Signed   By: Marybelle Killings M.D.   On: 04/11/2017 12:39   Ir Fluoro Guide Port  Insertion Right  Result Date: 04/11/2017 CLINICAL DATA:  Endometrial carcinoma EXAM: TUNNEL POWER PORT PLACEMENT WITH SUBCUTANEOUS POCKET UTILIZING ULTRASOUND & FLOUROSCOPY FLUOROSCOPY TIME:  24 seconds.  Two mGy. MEDICATIONS AND MEDICAL HISTORY: Versed 2 mg, Fentanyl 100 mcg. Additional Medications: Ancef 2 g. Antibiotics were given within 2 hours of the procedure. ANESTHESIA/SEDATION: Moderate sedation time: 38 minutes. Nursing  monitored the the patient during the procedure. PROCEDURE: After written informed consent was obtained, patient was placed in the supine position on angiographic table. The right neck and chest was prepped and draped in a sterile fashion. Lidocaine was utilized for local anesthesia. The right jugular vein was noted to be patent initially with ultrasound. Under sonographic guidance, a micropuncture needle was inserted into the right IJ vein (Ultrasound and fluoroscopic image documentation was performed). The needle was removed over an 018 wire which was exchanged for a Amplatz. This was advanced into the IVC. An 8-French dilator was advanced over the Amplatz. A small incision was made in the right upper chest over the anterior right second rib. Utilizing blunt dissection, a subcutaneous pocket was created in the caudal direction. The pocket was irrigated with a copious amount of sterile normal saline. The port catheter was tunneled from the chest incision, and out the neck incision. The reservoir was inserted into the subcutaneous pocket and secured with two 3-0 Ethilon stitches. A peel-away sheath was advanced over the Amplatz wire. The port catheter was cut to measure length and inserted through the peel-away sheath. The peel-away sheath was removed. The chest incision was closed with 3-0 Vicryl interrupted stitches for the subcutaneous tissue and a running of 4-0 Vicryl subcuticular stitch for the skin. The neck incision was closed with a 4-0 Vicryl subcuticular stitch. Derma-bond was  applied to both surgical incisions. The port reservoir was flushed and instilled with heparinized saline. No complications. FINDINGS: A right IJ vein Port-A-Cath is in place with its tip at the cavoatrial junction. COMPLICATIONS: None IMPRESSION: Successful 8 French right internal jugular vein power port placement with its tip at the SVC/RA junction. Electronically Signed   By: Marybelle Killings M.D.   On: 04/11/2017 12:39    ASSESSMENT & PLAN:  Endometrial cancer ([place]) I have reviewed results of echocardiogram with the patient I discussed with the patient and reviewed the current guidelines We discussed various options and finally she agreed to proceed with the treatment. She understood that the goal of treatment is palliative The treatment is approved based on the following publication:  Gynecol Oncol. 2005 Aug;98(2):294-8. Phase II trial of liposomal doxorubicin at 40 mg/m(2) every 4 weeks in endometrial carcinoma: a Gynecologic Oncology Group Study. [place] HD1, Blessing JA, [place] Mindi Curling, [place]. Abstract OBJECTIVE:  In patients with disseminated endometrial carcinoma, liposomal doxorubicin has possible advantages over doxorubicin which has proven single agent activity but well known cardiac toxicity. Before replacing doxorubicin in clinical trials, the Gynecologic Oncology Group (GOG) decided to conduct a phase II clinical trial of liposomal doxorubicin ([place]) in first-line therapy of patients with disseminated endometrial carcinoma. METHODS:  Patients with initial histologic confirmation of endometrial carcinoma presenting with disseminated or recurrent cancer who had not previously received cytotoxic drugs were considered for participation in this clinical trial. Eligible patients had measurable disease, GOG performance status 0-2, and adequate bone marrow, renal, and hepatic function according to standard criteria. Liposomal doxorubicin 40 mg/m(2) was  given by intravenous injection on an every 4-week cycle until toxicity or progression. Patients who remained free from tumor progression or intolerable toxicity received at least one to a maximum of 20 cycles of liposomal doxorubicin. RESULTS:  Fifty-six patients were registered, of whom three were determined ineligible (prior malignancy = 2, inadequate pathology material = 1). One patient never received therapy, leaving 52 evaluable patients. Two patients (3.8%) achieved a complete response, four (7.7%) exhibited  a partial response, and 31 (59.7%) had stable disease. The most common adverse events were constitutional (32/52), anemia (28/52), pain (27/52), dermatologic (25/52), and cardiovascular (12/52). CONCLUSIONS:  In this trial, liposomal doxorubicin had a response rate of 11.5% in first-line treatment of disseminated endometrial carcinoma when given at 40 mg/m(2) every 4 weeks. In view of the associated skin toxicity at this dose, liposomal doxorubicin does not appear to be a suitable replacement for the more active doxorubicin for therapy of endometrial carcinoma.  We discussed some of the risks, benefits and side-effects of Doxorubcin   Some of the short term side-effects included, though not limited to, risk of fatigue, weight loss, risk of allergic reactions, pancytopenia, life-threatening infections, need for transfusions of blood products, nausea, vomiting, change in bowel habits, hair loss, risk of congestive heart failure, admission to hospital for various reasons, and risks of death.   Long term side-effects are also discussed including permanent damage to nerve function, chronic fatigue, and rare secondary malignancy including bone marrow disorders.   The patient is aware that the response rates discussed earlier is not guaranteed.    After a long discussion, patient made an informed decision to proceed with the prescribed plan of care.  With her permission permission, I will ask  pathologist to add HER-2/neu testing on her tissue sample    Leukopenia due to antineoplastic chemotherapy [facility]) She has mild leukopenia She is not symptomatic Observe for now   No orders of the defined types were placed in this encounter.  All questions were answered. The patient knows to call the clinic with any problems, questions or concerns. No barriers to learning was detected. I spent 15 minutes counseling the patient face to face. The total time spent in the appointment was 20 minutes and more than 50% was on counseling and review of test results     [name], MD 04/19/2017 11:33 AM

## 2017-04-19 NOTE — Assessment & Plan Note (Signed)
She has mild leukopenia She is not symptomatic Observe for now

## 2017-04-19 NOTE — Telephone Encounter (Signed)
Called for follow up after first treatment yesterday. She said she is doing great and is at work today. Instructed to call for questions or concerns.

## 2017-04-20 ENCOUNTER — Telehealth: Payer: Self-pay

## 2017-04-20 NOTE — Telephone Encounter (Signed)
-----  Message from [name], MD sent at 04/20/2017  1:13 PM EST ----- Regarding: Her 2 negative pls let her know she is her2 negative ----- Message ----- From: Interface, Lab In [facility] [place] [facility] Sent: 04/04/2017   2:10 PM To: [name], MD

## 2017-04-20 NOTE — Telephone Encounter (Signed)
Called with below message. Instructed to call for questions. 

## 2017-04-30 MED FILL — SIMVASTATIN 40 MG TABLET: 40 | 90 days supply | Qty: 90 | Fill #0

## 2017-05-17 ENCOUNTER — Ambulatory Visit (HOSPITAL_BASED_OUTPATIENT_CLINIC_OR_DEPARTMENT_OTHER): Payer: 59 | Admitting: Hematology and Oncology

## 2017-05-17 ENCOUNTER — Ambulatory Visit: Payer: 59

## 2017-05-17 ENCOUNTER — Ambulatory Visit (HOSPITAL_BASED_OUTPATIENT_CLINIC_OR_DEPARTMENT_OTHER): Payer: 59

## 2017-05-17 ENCOUNTER — Other Ambulatory Visit (HOSPITAL_BASED_OUTPATIENT_CLINIC_OR_DEPARTMENT_OTHER): Payer: 59

## 2017-05-17 DIAGNOSIS — C541 Malignant neoplasm of endometrium: Secondary | ICD-10-CM

## 2017-05-17 DIAGNOSIS — D61818 Other pancytopenia: Secondary | ICD-10-CM | POA: Diagnosis not present

## 2017-05-17 DIAGNOSIS — Z5111 Encounter for antineoplastic chemotherapy: Secondary | ICD-10-CM

## 2017-05-17 LAB — COMPREHENSIVE METABOLIC PANEL
ALT: 18 U/L (ref 0–55)
AST: 25 U/L (ref 5–34)
Albumin: 3.9 g/dL (ref 3.5–5.0)
Alkaline Phosphatase: 57 U/L (ref 40–150)
Anion Gap: 9 mEq/L (ref 3–11)
BUN: 15.2 mg/dL (ref 7.0–26.0)
CO2: 25 mEq/L (ref 22–29)
Calcium: 9.6 mg/dL (ref 8.4–10.4)
Chloride: 106 mEq/L (ref 98–109)
Creatinine: 0.8 mg/dL (ref 0.6–1.1)
EGFR: >60 mL/min/{1.73_m2} (ref >60)
Glucose: 85 mg/dl (ref 70–140)
Potassium: 3.6 mEq/L (ref 3.5–5.1)
Sodium: 141 mEq/L (ref 136–145)
Total Bilirubin: 0.47 mg/dL (ref 0.20–1.20)
Total Protein: 7 g/dL (ref 6.4–8.3)

## 2017-05-17 LAB — CBC WITH DIFFERENTIAL/PLATELET
BASO%: 2.6 % — ABNORMAL HIGH (ref 0.0–2.0)
Basophils Absolute: 0.1 10*3/uL (ref 0.0–0.1)
EOS%: 1.6 % (ref 0.0–7.0)
Eosinophils Absolute: 0 10*3/uL (ref 0.0–0.5)
HCT: 32.2 % — ABNORMAL LOW (ref 34.8–46.6)
HGB: 10.9 g/dL — ABNORMAL LOW (ref 11.6–15.9)
LYMPH%: 22.1 % (ref 14.0–49.7)
MCH: 32.9 pg (ref 25.1–34.0)
MCHC: 33.9 g/dL (ref 31.5–36.0)
MCV: 97.1 fL (ref 79.5–101.0)
MONO#: 0.4 10*3/uL (ref 0.1–0.9)
MONO%: 16.6 % — ABNORMAL HIGH (ref 0.0–14.0)
NEUT#: 1.3 10*3/uL — ABNORMAL LOW (ref 1.5–6.5)
NEUT%: 57.1 % (ref 38.4–76.8)
Platelets: 212 10*3/uL (ref 145–400)
RBC: 3.32 10*6/uL — ABNORMAL LOW (ref 3.70–5.45)
RDW: 13.2 % (ref 11.2–14.5)
WBC: 2.3 10*3/uL — ABNORMAL LOW (ref 3.9–10.3)
lymph#: 0.5 10*3/uL — ABNORMAL LOW (ref 0.9–3.3)

## 2017-05-17 MED ORDER — DEXTROSE 5 % IV SOLN
Freq: Once | INTRAVENOUS | Status: AC
  Administered 2017-05-17: 11:00:00 via INTRAVENOUS

## 2017-05-17 MED ORDER — HEPARIN SOD (PORK) LOCK FLUSH 100 UNIT/ML IV SOLN
500.0000 [IU] | Freq: Once | INTRAVENOUS | Status: AC | PRN
  Administered 2017-05-17: 500 [IU]
  Filled 2017-05-17: qty 5

## 2017-05-17 MED ORDER — DEXAMETHASONE SODIUM PHOSPHATE 10 MG/ML IJ SOLN
10.0000 mg | Freq: Once | INTRAMUSCULAR | Status: AC
  Administered 2017-05-17: 10 mg via INTRAVENOUS

## 2017-05-17 MED ORDER — DEXAMETHASONE SODIUM PHOSPHATE 10 MG/ML IJ SOLN
INTRAMUSCULAR | Status: AC
  Filled 2017-05-17: qty 1

## 2017-05-17 MED ORDER — DOXORUBICIN HCL LIPOSOMAL CHEMO INJECTION 2 MG/ML
32.0000 mg/m2 | Freq: Once | INTRAVENOUS | Status: AC
  Administered 2017-05-17: 60 mg via INTRAVENOUS
  Filled 2017-05-17: qty 30

## 2017-05-17 MED ORDER — SODIUM CHLORIDE 0.9% FLUSH
10.0000 mL | INTRAVENOUS | Status: DC | PRN
  Administered 2017-05-17: 10 mL
  Filled 2017-05-17: qty 10

## 2017-05-17 NOTE — Patient Instructions (Signed)

## 2017-05-17 NOTE — Patient Instructions (Signed)
[facility] Discharge Instructions for Patients Receiving Chemotherapy  Today you received the following chemotherapy agent: Doxil.  To help prevent nausea and vomiting after your treatment, we encourage you to take your nausea medication.   If you develop nausea and vomiting that is not controlled by your nausea medication, call the clinic.   BELOW ARE SYMPTOMS THAT SHOULD BE REPORTED IMMEDIATELY:  *FEVER GREATER THAN 100.5 F  *CHILLS WITH OR WITHOUT FEVER  NAUSEA AND VOMITING THAT IS NOT CONTROLLED WITH YOUR NAUSEA MEDICATION  *UNUSUAL SHORTNESS OF BREATH  *UNUSUAL BRUISING OR BLEEDING  TENDERNESS IN MOUTH AND THROAT WITH OR WITHOUT PRESENCE OF ULCERS  *URINARY PROBLEMS  *BOWEL PROBLEMS  UNUSUAL RASH Items with * indicate a potential emergency and should be followed up as soon as possible.  Feel free to call the clinic should you have any questions or concerns. The clinic phone number is [phone].  Please show the [place] at check-in to the Emergency Department and triage nurse.

## 2017-05-18 ENCOUNTER — Encounter: Payer: Self-pay | Admitting: Hematology and Oncology

## 2017-05-18 DIAGNOSIS — D61818 Other pancytopenia: Secondary | ICD-10-CM | POA: Insufficient documentation

## 2017-05-18 NOTE — Assessment & Plan Note (Addendum)
She tolerated treatment well except for pancytopenia We will proceed with treatment with minor dose adjustment I plan for minimum 3 cycles of chemo before repeat imaging study.

## 2017-05-18 NOTE — Progress Notes (Signed)
[facility] OFFICE PROGRESS NOTE  Patient Care Team: [name], MD as PCP - General (Family Medicine)  SUMMARY OF ONCOLOGIC HISTORY: Oncology History   [employer] testing done 11-2015 on surgical path from 2014: MS stable TMB low 4 muts/mb ATM [id number] ERBB3 T389K E2H2 rearrangement exon 9 PPP2R1A P179R TP53 I195N    ER- APPROXIMATELY 25-35% STAINING IN NEOPLASTIC CELLS (INTERMEDIATE)  PR- APPROXIMATELY 25-35% STAINING IN NEOPLASTIC CELLS (STRONG)  Repeat biopsy 04/02/17: ER negative, Her 2 negative     Endometrial cancer ([place])   06/20/2012 Pathology Results    Biopsy positive for papillary serous carcinoma      06/20/2012 Genetic Testing    [employer] testing done 11-2015 on surgical path from 2014: MS stable TMB low 4 muts/mb ATM [id number] ERBB3 T389K E2H2 rearrangement exon 9 PPP2R1A P179R TP53 I195N       06/20/2012 Initial Diagnosis    Patient presented to PCP with intermittent vaginal bleeding since ~ Oct 2013, endometrial biopsy 06-20-12 with complex endometrial hyperplasia with atypia      06/26/2012 Imaging    Thickened endometrial lining in a postmenopausal patient experiencing vaginal bleeding. In the setting of post-menopausal bleeding, endometrial sampling is indicated to exclude carcinoma. No focal myometrial abnormalities are seen.  Normal left ovary and non-visualized right ovary      07/30/2012 Surgery    Dr. Polly Cobia performed robotic hysterectomy with bilateral salpingo-oophorectomy, bilateral pelvic lymph node dissection and periaortic lymph node dissection. Intraoperatively on frozen section, the patient was noted to have a large endometrial polyp with changes within the polyp consistent for high-grade malignancy, possibly papillary serous carcinoma. There is no obvious extrauterine disease noted.        07/30/2012 Pathology Results    [phone] SUPPLEMENTAL REPORT  THE ENDOMETRIAL CARCINOMA  WAS ANALYZED FOR DNA MISMATCH REPAIR PROTEINS.IMMUNOHISTOCHEMICALLY, THE NEOPLASM RETAINED NUCLEAR EXPRESSION OF 4 GENE PRODUCTS, MLH1, MSH2, MSH6, AND PMS2, INVOLVED IN DNA MISMATCH REPAIR.POSITIVE AND NEGATIVE CONTROLS WORKED APPROPRIATELY.  PER REQUEST, AN ER AND PR ARE PERFORMED ON BLOCK 1I.  ER- APPROXIMATELY 25-35% STAINING IN NEOPLASTIC CELLS (INTERMEDIATE)  PR- APPROXIMATELY 25-35% STAINING IN NEOPLASTIC CELLS (STRONG)  ER AND PR PREDOMINANTLY SHOW STAINING IN THE SEROUS COMPONENT.  DIAGNOSIS  1. UTERUS, CERVIX WITH BILATERAL FALLOPIAN TUBES AND OVARIES,  HYSTERECTOMY AND BILATERAL SALPINGO-OOPHORECTOMY:  HIGH GRADE/POORLY DIFFERENTIATED ENDOMETRIAL ADENOCARCINOMA WITH SOLID AND SEROUS PAPILLARY COMPONENTS.  HISTOPATHOLOGIC TYPE:A VARIETY OF PATTERNS WERE PRESENT, ALL OF WHICH SHOULD BE CONSIDERED TO BE HIGH GRADE.SEROUS PAPILLARY CARCINOMA WAS SEEN OCCURRING ADJACENT TO SOLID ENDOMETRIAL CARCINOMA WITH MARKED ANAPLASIA AND GIANT CELLS. SIZE:TUMOR MEASURED AT LEAST 4.8 CM IN GREATEST HORIZONTAL DIMENSION.  GRADE: POORLY DIFFERENTIATED OR HIGH GRADE. DEPTH OF INVASION: NO DEFINITE MYOMETRIAL INVOLVEMENT WAS SEEN.THE TUMOR APPEARED CONFINED TO THE POLYP AS WELL AS SURFACE ENDOMETRIUM. WHERE MYOMETRIAL THICKNESS NOT APPLICABLE. SEROSAL INVOLVEMENT: NOT DEMONSTRATED.  ENDOCERVICAL INVOLVEMENT:NOT DEMONSTRATED. RESECTION MARGINS: FREE OF INVOLVEMENT. EXTRAUTERINE EXTENSION:NOT DEMONSTRATED. ANGIOLYMPHATIC INVASION: NOT DEFINITELY SEEN. TOTAL NODES EXAMINED:22.  PELVIC NODES EXAMINED:20.  PELVIC NODES INVOLVED:0.  PARA-AORTIC NODES 2. EXAMINED:  PARA-AORTIC NODES 0. INVOLVED TNM STAGE: T1A N0 MX. AJCC STAGE GROUPING: IA. FIGO STAGE:IA.      08/26/2012 Imaging    No CT evidence for intra-abdominal or pelvic metastatic disease. Trace free pelvic fluid, presumably postoperative although  the date of surgery is not documented in the electronic medical record.      08/26/2012 - 12/13/2012 Chemotherapy    She received 6 cycles of carbo/taxol  10/29/2012 - 11/27/2012 Radiation Therapy    May 27, June 5,  June 11, June 19, November 27, 2012: Proximal vagina 30 Gy in 5 fractions        01/17/2013 Imaging    1.  No evidence of recurrent or metastatic disease. 2.  No acute abnormality involving the abdomen or pelvis. 3.  Mild diffuse hepatic steatosis. 4.  Very small supraumbilical midline anterior abdominal wall hernia containing fat, unchanged      01/22/2014 Imaging    No evidence for metastatic or recurrent disease. 2. No bowel obstruction.  Normal appendix. 3. Small fat containing hernia, stable in appearance. 4. Status post hysterectomy and bilateral oophorectomy      02/19/2014 Imaging    No pulmonary lesions are identified. 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 No other sites of metastatic disease identified within the abdomen or pelvis.        11/27/2014 - 02/11/2015 Chemotherapy    She received 4 cycles of carbo/taxol       03/01/2015 PET scan    Single hypermetabolic small retroperitoneal lymph node along the aorta. 2. No evidence of metastatic disease otherwise in the abdomen or pelvis. No evidence local recurrence. 3. Intensely hypermetabolic enlarged nodule adjacent to the RIGHT lobe of thyroid gland. This presumably represents the biopsied lesion in clinician report which was found to be benign thyroid tissue.      04/05/2015 - 05/13/2015 Radiation Therapy    She received 50.4 gray in 28 fractions with simultaneous integrated boost to 56 gray      06/17/2015 Imaging    No acute process or evidence of metastatic  disease in the abdomen or pelvis. Resolution of previously described retroperitoneal adenopathy. 2.  Possible constipation. 3. Atherosclerosis.      06/17/2015 Tumor Marker    Patient's tumor was tested for the following markers: CA125 Results of the tumor marker test revealed 33      08/12/2015 Tumor Marker    Patient's tumor was tested for the following markers: CA125 Results of the tumor marker test revealed 52      08/31/2015 PET scan    Development of right paratracheal hypermetabolic adenopathy, consistent with nodal metastasis. 2. The previously described isolated abdominal retroperitoneal hypermetabolic node has resolved. 3. Persistent hypermetabolic right thyroid nodule, per report previously biopsied. Correlate with those results. 4.  Possible constipation.      09/09/2015 -  Anti-estrogen oral therapy    She has been receiving alternative treatment between megace and tamoxifen      09/09/2015 Tumor Marker    Patient's tumor was tested for the following markers: CA125 Results of the tumor marker test revealed 37.8      09/20/2015 Procedure    Technically successful ultrasound-guided thyroid aspiration biopsy , dominant right nodule      09/20/2015 Pathology Results    THYROID, RIGHT, FINE NEEDLE ASPIRATION (SPECIMEN 1 OF 1 COLLECTED 09/20/15): FINDINGS CONSISTENT WITH BENIGN THYROID NODULE (BETHESDA CATEGORY II).      10/28/2015 Tumor Marker    Patient's tumor was tested for the following markers: CA125 Results of the tumor marker test revealed 53.2      11/04/2015 Imaging    Stable benign right thyroid nodule      12/16/2015 Tumor Marker  Patient's tumor was tested for the following markers: CA125 Results of the tumor marker test revealed 38.1      03/22/2016 PET scan    Interval progression of hypermetabolic right paratracheal lymph node consistent with metastatic involvement. 2. Stable hypermetabolic right thyroid nodule. Reportedly this has been biopsied in the  past.      03/23/2016 Tumor Marker    Patient's tumor was tested for the following markers: CA125 Results of the tumor marker test revealed 62.4      04/13/2016 - 06/01/2016 Radiation Therapy    She received 56 Gy to the chest in 28 fractions       05/09/2016 Imaging    No evidence of left lower extremity deep vein thrombosis. No evidence of a superficial thrombosis of the greater and lesser saphenous veins. Positive for thrombus noted in several varicosities of the calf. No evidence of Baker's cyst on the left.      05/17/2016 Tumor Marker    Patient's tumor was tested for the following markers: CA125 Results of the tumor marker test revealed 9      06/29/2016 Tumor Marker    Patient's tumor was tested for the following markers: CA125 Results of the tumor marker test revealed 5.9      08/04/2016 Tumor Marker    Patient's tumor was tested for the following markers: CA125 Results of the tumor marker test revealed 6.0      09/12/2016 PET scan    Complete metabolic response to therapy, with resolution of hypermetabolic mediastinal lymphadenopathy since prior exam. No residual or new metastatic disease identified. Stable hypermetabolic right thyroid lobe nodule, which was previously biopsied on 09/20/2015.      03/13/2017 PET scan    1. Solitary focus of recurrent right paratracheal hypermetabolic activity, with a 1.0 cm right lower paratracheal node having a maximum SUV of 9.0. Appearance compatible with recurrent malignancy. 2. Continued hypermetabolic right thyroid nodule, previously biopsied, presumed benign -correlate with prior biopsy results. 3. Other imaging findings of potential clinical significance: Aortic Atherosclerosis (ICD10-I70.0). Mild cardiomegaly. Prominent stool throughout the colon favors constipation.      04/02/2017 Pathology Results    FINE NEEDLE ASPIRATION, ENDOSCOPIC, (EBUS) 4 R NODE (SPECIMEN 1 OF 2 COLLECTED 04/02/17): MALIGNANT CELLS PRESENT, CONSISTENT  WITH CARCINOMA. SEE COMMENT. COMMENT: THE MALIGNANT CELLS ARE POSITIVE FOR P53 AND NEGATIVE FOR ESTROGEN RECEPTOR AND TTF-1. THIS PROFILE IS NON-SPECIFIC, BUT THE P53 POSITIVE STAINING IS SUGGESTIVE OF GYNECOLOGIC PRIMARY.       04/11/2017 Procedure    Successful 8 French right internal jugular vein power port placement with its tip at the SVC/RA junction      04/12/2017 Imaging    Normal LV size with mild LV hypertrophy. EF 55-60%. Normal RV size and systolic function. Aortic valve sclerosis without significant stenosis.      04/18/2017 -  Chemotherapy    The patient had chemotherapy with Doxil        INTERVAL HISTORY: Please see below for problem oriented charting. She is seen prior to cycle 2 of chemotherapy She tolerated treatment well She complained of mild fatigue Denies chest pain, shortness of breath or nausea No mucositis or diarrhea She denies recent infection  REVIEW OF SYSTEMS:   Constitutional: Denies fevers, chills or abnormal weight loss Eyes: Denies blurriness of vision Ears, nose, mouth, throat, and face: Denies mucositis or sore throat Respiratory: Denies cough, dyspnea or wheezes Cardiovascular: Denies palpitation, chest discomfort or lower extremity swelling Gastrointestinal:  Denies nausea, heartburn or  change in bowel habits Skin: Denies abnormal skin rashes Lymphatics: Denies new lymphadenopathy or easy bruising Neurological:Denies numbness, tingling or new weaknesses Behavioral/Psych: Mood is stable, no new changes  All other systems were reviewed with the patient and are negative.  I have reviewed the past medical history, past surgical history, social history and family history with the patient and they are unchanged from previous note.  ALLERGIES:  has No Known Allergies.  MEDICATIONS:  Current Outpatient Medications  Medication Sig Dispense Refill  . aspirin EC 81 MG tablet Take 81 mg by mouth daily.    . Calcium Carb-Cholecalciferol  500-600 MG-UNIT TABS Take 1 tablet by mouth daily.     . Cholecalciferol (VITAMIN D3) 2000 units TABS Take 2,000 Units by mouth daily.     Marland Kitchen ibuprofen (ADVIL,MOTRIN) 200 MG tablet Take 200 mg by mouth daily as needed for headache or moderate pain.    Marland Kitchen lidocaine-prilocaine (EMLA) cream Apply to affected area once 30 g 3  . ondansetron (ZOFRAN) 8 MG tablet Take 1 tablet (8 mg total) by mouth 2 (two) times daily as needed (Nausea or vomiting). 30 tablet 1  . prochlorperazine (COMPAZINE) 10 MG tablet Take 1 tablet (10 mg total) by mouth every 6 (six) hours as needed (Nausea or vomiting). 30 tablet 1  . simvastatin (ZOCOR) 40 MG tablet Take 40 mg by mouth every evening.    . tacrolimus (PROTOPIC) 0.1 % ointment Apply 1 application topically daily as needed (rash).     . venlafaxine XR (EFFEXOR-XR) 75 MG 24 hr capsule Take 75 mg by mouth daily with breakfast.      No current facility-administered medications for this visit.     PHYSICAL EXAMINATION: ECOG PERFORMANCE STATUS: 1 - Symptomatic but completely ambulatory  Vitals:   05/17/17 1035  BP: (!) 168/86  Pulse: 85  Resp: 18  Temp: 98.1 F (36.7 C)  SpO2: 100%   Filed Weights   05/17/17 1035  Weight: 162 lb 11.2 oz (73.8 kg)    GENERAL:alert, no distress and comfortable SKIN: skin color, texture, turgor are normal, no rashes or significant lesions EYES: normal, Conjunctiva are pink and non-injected, sclera clear OROPHARYNX:no exudate, no erythema and lips, buccal mucosa, and tongue normal  NECK: supple, thyroid normal size, non-tender, without nodularity LYMPH:  no palpable lymphadenopathy in the cervical, axillary or inguinal LUNGS: clear to auscultation and percussion with normal breathing effort HEART: regular rate & rhythm and no murmurs and no lower extremity edema ABDOMEN:abdomen soft, non-tender and normal bowel sounds Musculoskeletal:no cyanosis of digits and no clubbing  NEURO: alert & oriented x 3 with fluent speech, no  focal motor/sensory deficits  LABORATORY DATA:  I have reviewed the data as listed    Component Value Date/Time   NA 141 05/17/2017 0957   K 3.6 05/17/2017 0957   CL 110 04/11/2017 0854   CL 105 11/19/2012 0848   CO2 25 05/17/2017 0957   GLUCOSE 85 05/17/2017 0957   GLUCOSE 122 (H) 11/19/2012 0848   BUN 15.2 05/17/2017 0957   CREATININE 0.8 05/17/2017 0957   CALCIUM 9.6 05/17/2017 0957   PROT 7.0 05/17/2017 0957   ALBUMIN 3.9 05/17/2017 0957   AST 25 05/17/2017 0957   ALT 18 05/17/2017 0957   ALKPHOS 57 05/17/2017 0957   BILITOT 0.47 05/17/2017 0957   GFRNONAA >60 04/11/2017 0854   GFRAA >60 04/11/2017 0854    No results found for: SPEP, UPEP  Lab Results  Component Value Date   WBC  2.3 (L) 05/17/2017   NEUTROABS 1.3 (L) 05/17/2017   HGB 10.9 (L) 05/17/2017   HCT 32.2 (L) 05/17/2017   MCV 97.1 05/17/2017   PLT 212 05/17/2017      Chemistry      Component Value Date/Time   NA 141 05/17/2017 0957   K 3.6 05/17/2017 0957   CL 110 04/11/2017 0854   CL 105 11/19/2012 0848   CO2 25 05/17/2017 0957   BUN 15.2 05/17/2017 0957   CREATININE 0.8 05/17/2017 0957   GLU 158 (H) 01/14/2015 1035      Component Value Date/Time   CALCIUM 9.6 05/17/2017 0957   ALKPHOS 57 05/17/2017 0957   AST 25 05/17/2017 0957   ALT 18 05/17/2017 0957   BILITOT 0.47 05/17/2017 0957       ASSESSMENT & PLAN:  Endometrial cancer ([place]) She tolerated treatment well except for pancytopenia We will proceed with treatment with minor dose adjustment I plan for minimum 3 cycles of chemo before repeat imaging study.  Pancytopenia, acquired ([place]) She is not symptomatic except for mild fatigue We discussed neutropenic precautions I plan to reduce the dose of chemotherapy a little bit   No orders of the defined types were placed in this encounter.  All questions were answered. The patient knows to call the clinic with any problems, questions or concerns. No barriers to learning was  detected. I spent 15 minutes counseling the patient face to face. The total time spent in the appointment was 20 minutes and more than 50% was on counseling and review of test results     [name], MD 05/18/2017 2:08 PM

## 2017-05-18 NOTE — Assessment & Plan Note (Signed)
She is not symptomatic except for mild fatigue We discussed neutropenic precautions I plan to reduce the dose of chemotherapy a little bit

## 2017-06-12 MED FILL — VENLAFAXINE HCL ER 75 MG CA: 75 | 90 days supply | Qty: 90 | Fill #1

## 2017-06-14 ENCOUNTER — Inpatient Hospital Stay: Payer: 59

## 2017-06-14 ENCOUNTER — Encounter: Payer: Self-pay | Admitting: Hematology and Oncology

## 2017-06-14 ENCOUNTER — Other Ambulatory Visit: Payer: Self-pay | Admitting: Hematology and Oncology

## 2017-06-14 ENCOUNTER — Inpatient Hospital Stay: Payer: 59 | Attending: Hematology and Oncology | Admitting: Hematology and Oncology

## 2017-06-14 VITALS — BP 164/87 | HR 85 | Temp 97.8°F | Resp 17 | Ht 63.0 in | Wt 165.6 lb

## 2017-06-14 DIAGNOSIS — D61818 Other pancytopenia: Secondary | ICD-10-CM

## 2017-06-14 DIAGNOSIS — R590 Localized enlarged lymph nodes: Secondary | ICD-10-CM

## 2017-06-14 DIAGNOSIS — I10 Essential (primary) hypertension: Secondary | ICD-10-CM

## 2017-06-14 DIAGNOSIS — C541 Malignant neoplasm of endometrium: Secondary | ICD-10-CM

## 2017-06-14 DIAGNOSIS — Z5111 Encounter for antineoplastic chemotherapy: Secondary | ICD-10-CM | POA: Diagnosis not present

## 2017-06-14 DIAGNOSIS — Z95828 Presence of other vascular implants and grafts: Secondary | ICD-10-CM

## 2017-06-14 DIAGNOSIS — R5383 Other fatigue: Secondary | ICD-10-CM

## 2017-06-14 LAB — CBC WITH DIFFERENTIAL/PLATELET
Basophils Absolute: 0.1 10*3/uL (ref 0.0–0.1)
Basophils Relative: 3 %
Eosinophils Absolute: 0.1 10*3/uL (ref 0.0–0.5)
Eosinophils Relative: 4 %
HCT: 31.4 % — ABNORMAL LOW (ref 34.8–46.6)
Hemoglobin: 10.9 g/dL — ABNORMAL LOW (ref 11.6–15.9)
Lymphocytes Relative: 17 %
Lymphs Abs: 0.4 10*3/uL — ABNORMAL LOW (ref 0.9–3.3)
MCH: 33.7 pg (ref 25.1–34.0)
MCHC: 34.7 g/dL (ref 31.5–36.0)
MCV: 97.1 fL (ref 79.5–101.0)
Monocytes Absolute: 0.4 10*3/uL (ref 0.1–0.9)
Monocytes Relative: 17 %
Neutro Abs: 1.4 10*3/uL — ABNORMAL LOW (ref 1.5–6.5)
Neutrophils Relative %: 59 %
Platelets: 202 10*3/uL (ref 145–400)
RBC: 3.24 MIL/uL — ABNORMAL LOW (ref 3.70–5.45)
RDW: 14.8 % (ref 11.2–16.1)
WBC: 2.4 10*3/uL — ABNORMAL LOW (ref 3.9–10.3)

## 2017-06-14 LAB — COMPREHENSIVE METABOLIC PANEL
ALT: 30 U/L (ref 0–55)
AST: 24 U/L (ref 5–34)
Albumin: 3.7 g/dL (ref 3.5–5.0)
Alkaline Phosphatase: 52 U/L (ref 40–150)
Anion gap: 6 (ref 3–11)
BUN: 15 mg/dL (ref 7–26)
CO2: 27 mmol/L (ref 22–29)
Calcium: 9.7 mg/dL (ref 8.4–10.4)
Chloride: 108 mmol/L (ref 98–109)
Creatinine, Ser: 0.8 mg/dL (ref 0.60–1.10)
GFR calc Af Amer: >60 mL/min (ref >60)
GFR calc non Af Amer: >60 mL/min (ref >60)
Glucose, Bld: 76 mg/dL (ref 70–140)
Potassium: 3.8 mmol/L (ref 3.3–4.7)
Sodium: 141 mmol/L (ref 136–145)
Total Bilirubin: 0.5 mg/dL (ref 0.2–1.2)
Total Protein: 6.7 g/dL (ref 6.4–8.3)

## 2017-06-14 MED ORDER — DEXAMETHASONE SODIUM PHOSPHATE 10 MG/ML IJ SOLN
10.0000 mg | Freq: Once | INTRAMUSCULAR | Status: AC
  Administered 2017-06-14: 10 mg via INTRAVENOUS

## 2017-06-14 MED ORDER — HEPARIN SOD (PORK) LOCK FLUSH 100 UNIT/ML IV SOLN
500.0000 [IU] | Freq: Once | INTRAVENOUS | Status: AC | PRN
  Administered 2017-06-14: 500 [IU]
  Filled 2017-06-14: qty 5

## 2017-06-14 MED ORDER — METOPROLOL TARTRATE 25 MG PO TABS: 25.0000 mg | ORAL_TABLET | Freq: Two times a day (BID) | ORAL | 3 refills | Status: DC

## 2017-06-14 MED ORDER — DOXORUBICIN HCL LIPOSOMAL CHEMO INJECTION 2 MG/ML
32.0000 mg/m2 | Freq: Once | INTRAVENOUS | Status: AC
  Administered 2017-06-14: 60 mg via INTRAVENOUS
  Filled 2017-06-14: qty 30

## 2017-06-14 MED ORDER — SODIUM CHLORIDE 0.9% FLUSH
10.0000 mL | Freq: Once | INTRAVENOUS | Status: AC
  Administered 2017-06-14: 10 mL
  Filled 2017-06-14: qty 10

## 2017-06-14 MED ORDER — DEXAMETHASONE SODIUM PHOSPHATE 10 MG/ML IJ SOLN
INTRAMUSCULAR | Status: AC
  Filled 2017-06-14: qty 1

## 2017-06-14 MED ORDER — SODIUM CHLORIDE 0.9% FLUSH
10.0000 mL | INTRAVENOUS | Status: DC | PRN
  Administered 2017-06-14: 10 mL
  Filled 2017-06-14: qty 10

## 2017-06-14 MED ORDER — DEXTROSE 5 % IV SOLN
Freq: Once | INTRAVENOUS | Status: AC
  Administered 2017-06-14: 10:00:00 via INTRAVENOUS

## 2017-06-14 MED FILL — METOPROLOL TARTRATE 25 MG T: 25 | 30 days supply | Qty: 60 | Fill #0

## 2017-06-14 NOTE — Assessment & Plan Note (Signed)
She tolerated treatment very well without major side effects except for pancytopenia We would proceed with treatment without dose adjustment I plan to repeat imaging study after 3 cycles of treatment

## 2017-06-14 NOTE — Progress Notes (Signed)
[place] OFFICE PROGRESS NOTE  Patient Care Team: [name], MD as PCP - General (Family Medicine)  SUMMARY OF ONCOLOGIC HISTORY: Oncology History   [employer] testing done 11-2015 on surgical path from 2014: MS stable TMB low 4 muts/mb ATM [id number] ERBB3 T389K E2H2 rearrangement exon 9 PPP2R1A P179R TP53 I195N    ER- APPROXIMATELY 25-35% STAINING IN NEOPLASTIC CELLS (INTERMEDIATE)  PR- APPROXIMATELY 25-35% STAINING IN NEOPLASTIC CELLS (STRONG)  Repeat biopsy 04/02/17: ER negative, Her 2 negative     Endometrial cancer ([place])   06/20/2012 Pathology Results    Biopsy positive for papillary serous carcinoma      06/20/2012 Genetic Testing    [employer] testing done 11-2015 on surgical path from 2014: MS stable TMB low 4 muts/mb ATM [id number] ERBB3 T389K E2H2 rearrangement exon 9 PPP2R1A P179R TP53 I195N       06/20/2012 Initial Diagnosis    Patient presented to PCP with intermittent vaginal bleeding since ~ Oct 2013, endometrial biopsy 06-20-12 with complex endometrial hyperplasia with atypia      06/26/2012 Imaging    Thickened endometrial lining in a postmenopausal patient experiencing vaginal bleeding. In the setting of post-menopausal bleeding, endometrial sampling is indicated to exclude carcinoma. No focal myometrial abnormalities are seen.  Normal left ovary and non-visualized right ovary      07/30/2012 Surgery    Dr. Polly Cobia performed robotic hysterectomy with bilateral salpingo-oophorectomy, bilateral pelvic lymph node dissection and periaortic lymph node dissection. Intraoperatively on frozen section, the patient was noted to have a large endometrial polyp with changes within the polyp consistent for high-grade malignancy, possibly papillary serous carcinoma. There is no obvious extrauterine disease noted.        07/30/2012 Pathology Results    [phone] SUPPLEMENTAL REPORT  THE ENDOMETRIAL CARCINOMA  WAS ANALYZED FOR DNA MISMATCH REPAIR PROTEINS.IMMUNOHISTOCHEMICALLY, THE NEOPLASM RETAINED NUCLEAR EXPRESSION OF 4 GENE PRODUCTS, MLH1, MSH2, MSH6, AND PMS2, INVOLVED IN DNA MISMATCH REPAIR.POSITIVE AND NEGATIVE CONTROLS WORKED APPROPRIATELY.  PER REQUEST, AN ER AND PR ARE PERFORMED ON BLOCK 1I.  ER- APPROXIMATELY 25-35% STAINING IN NEOPLASTIC CELLS (INTERMEDIATE)  PR- APPROXIMATELY 25-35% STAINING IN NEOPLASTIC CELLS (STRONG)  ER AND PR PREDOMINANTLY SHOW STAINING IN THE SEROUS COMPONENT.  DIAGNOSIS  1. UTERUS, CERVIX WITH BILATERAL FALLOPIAN TUBES AND OVARIES,  HYSTERECTOMY AND BILATERAL SALPINGO-OOPHORECTOMY:  HIGH GRADE/POORLY DIFFERENTIATED ENDOMETRIAL ADENOCARCINOMA WITH SOLID AND SEROUS PAPILLARY COMPONENTS.  HISTOPATHOLOGIC TYPE:A VARIETY OF PATTERNS WERE PRESENT, ALL OF WHICH SHOULD BE CONSIDERED TO BE HIGH GRADE.SEROUS PAPILLARY CARCINOMA WAS SEEN OCCURRING ADJACENT TO SOLID ENDOMETRIAL CARCINOMA WITH MARKED ANAPLASIA AND GIANT CELLS. SIZE:TUMOR MEASURED AT LEAST 4.8 CM IN GREATEST HORIZONTAL DIMENSION.  GRADE: POORLY DIFFERENTIATED OR HIGH GRADE. DEPTH OF INVASION: NO DEFINITE MYOMETRIAL INVOLVEMENT WAS SEEN.THE TUMOR APPEARED CONFINED TO THE POLYP AS WELL AS SURFACE ENDOMETRIUM. WHERE MYOMETRIAL THICKNESS NOT APPLICABLE. SEROSAL INVOLVEMENT: NOT DEMONSTRATED.  ENDOCERVICAL INVOLVEMENT:NOT DEMONSTRATED. RESECTION MARGINS: FREE OF INVOLVEMENT. EXTRAUTERINE EXTENSION:NOT DEMONSTRATED. ANGIOLYMPHATIC INVASION: NOT DEFINITELY SEEN. TOTAL NODES EXAMINED:22.  PELVIC NODES EXAMINED:20.  PELVIC NODES INVOLVED:0.  PARA-AORTIC NODES 2. EXAMINED:  PARA-AORTIC NODES 0. INVOLVED TNM STAGE: T1A N0 MX. AJCC STAGE GROUPING: IA. FIGO STAGE:IA.      08/26/2012 Imaging    No CT evidence for intra-abdominal or pelvic metastatic disease. Trace free pelvic fluid, presumably postoperative although  the date of surgery is not documented in the electronic medical record.      08/26/2012 - 12/13/2012 Chemotherapy    She received 6 cycles of carbo/taxol  10/29/2012 - 11/27/2012 Radiation Therapy    May 27, June 5,  June 11, June 19, November 27, 2012: Proximal vagina 30 Gy in 5 fractions        01/17/2013 Imaging    1.  No evidence of recurrent or metastatic disease. 2.  No acute abnormality involving the abdomen or pelvis. 3.  Mild diffuse hepatic steatosis. 4.  Very small supraumbilical midline anterior abdominal wall hernia containing fat, unchanged      01/22/2014 Imaging    No evidence for metastatic or recurrent disease. 2. No bowel obstruction.  Normal appendix. 3. Small fat containing hernia, stable in appearance. 4. Status post hysterectomy and bilateral oophorectomy      02/19/2014 Imaging    No pulmonary lesions are identified. 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 No other sites of metastatic disease identified within the abdomen or pelvis.        11/27/2014 - 02/11/2015 Chemotherapy    She received 4 cycles of carbo/taxol       03/01/2015 PET scan    Single hypermetabolic small retroperitoneal lymph node along the aorta. 2. No evidence of metastatic disease otherwise in the abdomen or pelvis. No evidence local recurrence. 3. Intensely hypermetabolic enlarged nodule adjacent to the RIGHT lobe of thyroid gland. This presumably represents the biopsied lesion in clinician report which was found to be benign thyroid tissue.      04/05/2015 - 05/13/2015 Radiation Therapy    She received 50.4 gray in 28 fractions with simultaneous integrated boost to 56 gray      06/17/2015 Imaging    No acute process or evidence of metastatic  disease in the abdomen or pelvis. Resolution of previously described retroperitoneal adenopathy. 2.  Possible constipation. 3. Atherosclerosis.      06/17/2015 Tumor Marker    Patient's tumor was tested for the following markers: CA125 Results of the tumor marker test revealed 33      08/12/2015 Tumor Marker    Patient's tumor was tested for the following markers: CA125 Results of the tumor marker test revealed 52      08/31/2015 PET scan    Development of right paratracheal hypermetabolic adenopathy, consistent with nodal metastasis. 2. The previously described isolated abdominal retroperitoneal hypermetabolic node has resolved. 3. Persistent hypermetabolic right thyroid nodule, per report previously biopsied. Correlate with those results. 4.  Possible constipation.      09/09/2015 -  Anti-estrogen oral therapy    She has been receiving alternative treatment between megace and tamoxifen      09/09/2015 Tumor Marker    Patient's tumor was tested for the following markers: CA125 Results of the tumor marker test revealed 37.8      09/20/2015 Procedure    Technically successful ultrasound-guided thyroid aspiration biopsy , dominant right nodule      09/20/2015 Pathology Results    THYROID, RIGHT, FINE NEEDLE ASPIRATION (SPECIMEN 1 OF 1 COLLECTED 09/20/15): FINDINGS CONSISTENT WITH BENIGN THYROID NODULE (BETHESDA CATEGORY II).      10/28/2015 Tumor Marker    Patient's tumor was tested for the following markers: CA125 Results of the tumor marker test revealed 53.2      11/04/2015 Imaging    Stable benign right thyroid nodule      12/16/2015 Tumor Marker  Patient's tumor was tested for the following markers: CA125 Results of the tumor marker test revealed 38.1      03/22/2016 PET scan    Interval progression of hypermetabolic right paratracheal lymph node consistent with metastatic involvement. 2. Stable hypermetabolic right thyroid nodule. Reportedly this has been biopsied in the  past.      03/23/2016 Tumor Marker    Patient's tumor was tested for the following markers: CA125 Results of the tumor marker test revealed 62.4      04/13/2016 - 06/01/2016 Radiation Therapy    She received 56 Gy to the chest in 28 fractions       05/09/2016 Imaging    No evidence of left lower extremity deep vein thrombosis. No evidence of a superficial thrombosis of the greater and lesser saphenous veins. Positive for thrombus noted in several varicosities of the calf. No evidence of Baker's cyst on the left.      05/17/2016 Tumor Marker    Patient's tumor was tested for the following markers: CA125 Results of the tumor marker test revealed 9      06/29/2016 Tumor Marker    Patient's tumor was tested for the following markers: CA125 Results of the tumor marker test revealed 5.9      08/04/2016 Tumor Marker    Patient's tumor was tested for the following markers: CA125 Results of the tumor marker test revealed 6.0      09/12/2016 PET scan    Complete metabolic response to therapy, with resolution of hypermetabolic mediastinal lymphadenopathy since prior exam. No residual or new metastatic disease identified. Stable hypermetabolic right thyroid lobe nodule, which was previously biopsied on 09/20/2015.      03/13/2017 PET scan    1. Solitary focus of recurrent right paratracheal hypermetabolic activity, with a 1.0 cm right lower paratracheal node having a maximum SUV of 9.0. Appearance compatible with recurrent malignancy. 2. Continued hypermetabolic right thyroid nodule, previously biopsied, presumed benign -correlate with prior biopsy results. 3. Other imaging findings of potential clinical significance: Aortic Atherosclerosis (ICD10-I70.0). Mild cardiomegaly. Prominent stool throughout the colon favors constipation.      04/02/2017 Pathology Results    FINE NEEDLE ASPIRATION, ENDOSCOPIC, (EBUS) 4 R NODE (SPECIMEN 1 OF 2 COLLECTED 04/02/17): MALIGNANT CELLS PRESENT, CONSISTENT  WITH CARCINOMA. SEE COMMENT. COMMENT: THE MALIGNANT CELLS ARE POSITIVE FOR P53 AND NEGATIVE FOR ESTROGEN RECEPTOR AND TTF-1. THIS PROFILE IS NON-SPECIFIC, BUT THE P53 POSITIVE STAINING IS SUGGESTIVE OF GYNECOLOGIC PRIMARY.       04/11/2017 Procedure    Successful 8 French right internal jugular vein power port placement with its tip at the SVC/RA junction      04/12/2017 Imaging    Normal LV size with mild LV hypertrophy. EF 55-60%. Normal RV size and systolic function. Aortic valve sclerosis without significant stenosis.      04/18/2017 -  Chemotherapy    The patient had chemotherapy with Doxil        INTERVAL HISTORY: Please see below for problem oriented charting. She returns for further follow-up, to be seen prior to cycle 3 of chemotherapy She tolerated treatment well Denies nausea or vomiting She complained of mild fatigue No recent infection.  REVIEW OF SYSTEMS:   Constitutional: Denies fevers, chills or abnormal weight loss Eyes: Denies blurriness of vision Ears, nose, mouth, throat, and face: Denies mucositis or sore throat Respiratory: Denies cough, dyspnea or wheezes Cardiovascular: Denies palpitation, chest discomfort or lower extremity swelling Gastrointestinal:  Denies nausea, heartburn or change in bowel habits  Skin: Denies abnormal skin rashes Lymphatics: Denies new lymphadenopathy or easy bruising Neurological:Denies numbness, tingling or new weaknesses Behavioral/Psych: Mood is stable, no new changes  All other systems were reviewed with the patient and are negative.  I have reviewed the past medical history, past surgical history, social history and family history with the patient and they are unchanged from previous note.  ALLERGIES:  has No Known Allergies.  MEDICATIONS:  Current Outpatient Medications  Medication Sig Dispense Refill  . aspirin EC 81 MG tablet Take 81 mg by mouth daily.    . Calcium Carb-Cholecalciferol 500-600 MG-UNIT TABS Take 1  tablet by mouth daily.     . Cholecalciferol (VITAMIN D3) 2000 units TABS Take 2,000 Units by mouth daily.     Marland Kitchen ibuprofen (ADVIL,MOTRIN) 200 MG tablet Take 200 mg by mouth daily as needed for headache or moderate pain.    Marland Kitchen lidocaine-prilocaine (EMLA) cream Apply to affected area once 30 g 3  . metoprolol tartrate (LOPRESSOR) 25 MG tablet Take 1 tablet (25 mg total) by mouth 2 (two) times daily. 60 tablet 3  . ondansetron (ZOFRAN) 8 MG tablet Take 1 tablet (8 mg total) by mouth 2 (two) times daily as needed (Nausea or vomiting). 30 tablet 1  . prochlorperazine (COMPAZINE) 10 MG tablet Take 1 tablet (10 mg total) by mouth every 6 (six) hours as needed (Nausea or vomiting). 30 tablet 1  . simvastatin (ZOCOR) 40 MG tablet Take 40 mg by mouth every evening.    . tacrolimus (PROTOPIC) 0.1 % ointment Apply 1 application topically daily as needed (rash).     . venlafaxine XR (EFFEXOR-XR) 75 MG 24 hr capsule Take 75 mg by mouth daily with breakfast.      No current facility-administered medications for this visit.    Facility-Administered Medications Ordered in Other Visits  Medication Dose Route Frequency Provider Last Rate Last Dose  . sodium chloride flush (NS) 0.9 % injection 10 mL  10 mL Intracatheter PRN [name], MD   10 mL at 06/14/17 1206    PHYSICAL EXAMINATION: ECOG PERFORMANCE STATUS: 1 - Symptomatic but completely ambulatory  Vitals:   06/14/17 0930  BP: (!) 164/87  Pulse: 85  Resp: 17  Temp: 97.8 F (36.6 C)  SpO2: 98%   Filed Weights   06/14/17 0930  Weight: 165 lb 9.6 oz (75.1 kg)    GENERAL:alert, no distress and comfortable SKIN: skin color, texture, turgor are normal, no rashes or significant lesions EYES: normal, Conjunctiva are pink and non-injected, sclera clear OROPHARYNX:no exudate, no erythema and lips, buccal mucosa, and tongue normal  NECK: supple, thyroid normal size, non-tender, without nodularity LYMPH:  no palpable lymphadenopathy in the cervical,  axillary or inguinal LUNGS: clear to auscultation and percussion with normal breathing effort HEART: regular rate & rhythm and no murmurs and no lower extremity edema ABDOMEN:abdomen soft, non-tender and normal bowel sounds Musculoskeletal:no cyanosis of digits and no clubbing  NEURO: alert & oriented x 3 with fluent speech, no focal motor/sensory deficits  LABORATORY DATA:  I have reviewed the data as listed    Component Value Date/Time   NA 141 06/14/2017 0852   NA 141 05/17/2017 0957   K 3.8 06/14/2017 0852   K 3.6 05/17/2017 0957   CL 108 06/14/2017 0852   CL 105 11/19/2012 0848   CO2 27 06/14/2017 0852   CO2 25 05/17/2017 0957   GLUCOSE 76 06/14/2017 0852   GLUCOSE 85 05/17/2017 0957   GLUCOSE 122 (H)  11/19/2012 0848   BUN 15 06/14/2017 0852   BUN 15.2 05/17/2017 0957   CREATININE 0.80 06/14/2017 0852   CREATININE 0.8 05/17/2017 0957   CALCIUM 9.7 06/14/2017 0852   CALCIUM 9.6 05/17/2017 0957   PROT 6.7 06/14/2017 0852   PROT 7.0 05/17/2017 0957   ALBUMIN 3.7 06/14/2017 0852   ALBUMIN 3.9 05/17/2017 0957   AST 24 06/14/2017 0852   AST 25 05/17/2017 0957   ALT 30 06/14/2017 0852   ALT 18 05/17/2017 0957   ALKPHOS 52 06/14/2017 0852   ALKPHOS 57 05/17/2017 0957   BILITOT 0.5 06/14/2017 0852   BILITOT 0.47 05/17/2017 0957   GFRNONAA >60 06/14/2017 0852   GFRAA >60 06/14/2017 0852    No results found for: SPEP, UPEP  Lab Results  Component Value Date   WBC 2.4 (L) 06/14/2017   NEUTROABS 1.4 (L) 06/14/2017   HGB 10.9 (L) 06/14/2017   HCT 31.4 (L) 06/14/2017   MCV 97.1 06/14/2017   PLT 202 06/14/2017      Chemistry      Component Value Date/Time   NA 141 06/14/2017 0852   NA 141 05/17/2017 0957   K 3.8 06/14/2017 0852   K 3.6 05/17/2017 0957   CL 108 06/14/2017 0852   CL 105 11/19/2012 0848   CO2 27 06/14/2017 0852   CO2 25 05/17/2017 0957   BUN 15 06/14/2017 0852   BUN 15.2 05/17/2017 0957   CREATININE 0.80 06/14/2017 0852   CREATININE 0.8  05/17/2017 0957   GLU 158 (H) 01/14/2015 1035      Component Value Date/Time   CALCIUM 9.7 06/14/2017 0852   CALCIUM 9.6 05/17/2017 0957   ALKPHOS 52 06/14/2017 0852   ALKPHOS 57 05/17/2017 0957   AST 24 06/14/2017 0852   AST 25 05/17/2017 0957   ALT 30 06/14/2017 0852   ALT 18 05/17/2017 0957   BILITOT 0.5 06/14/2017 0852   BILITOT 0.47 05/17/2017 0957      ASSESSMENT & PLAN:  Endometrial cancer ([place]) She tolerated treatment very well without major side effects except for pancytopenia We would proceed with treatment without dose adjustment I plan to repeat imaging study after 3 cycles of treatment  Pancytopenia, acquired ([place]) She is not symptomatic except for mild fatigue We discussed neutropenic precautions   Essential hypertension She is noted to have hypertension Due to high risk situation, I recommend metoprolol She agreed to proceed   Orders Placed This Encounter  Procedures  . NM PET Image Restag (PS) Skull Base To Thigh    Standing Status:   Future    Standing Expiration Date:   09/13/2018    Order Specific Question:   If indicated for the ordered procedure, I authorize the administration of a radiopharmaceutical per Radiology protocol    Answer:   Yes    Order Specific Question:   Preferred imaging location?    Answer:   [facility]    Order Specific Question:   Radiology Contrast Protocol - do NOT remove file path    Answer:   [email or web address]  . ECHOCARDIOGRAM LIMITED    Standing Status:   Future    Standing Expiration Date:   09/13/2018    Order Specific Question:   Where should this test be performed    Answer:   [place]    Order Specific Question:   Perflutren DEFINITY (image enhancing agent) should be administered unless hypersensitivity or allergy exist    Answer:   Administer Perflutren  Order Specific Question:   Expected Date:    Answer:   1 month   All questions were answered. The patient knows to  call the clinic with any problems, questions or concerns. No barriers to learning was detected. I spent 15 minutes counseling the patient face to face. The total time spent in the appointment was 20 minutes and more than 50% was on counseling and review of test results     [name], MD 06/14/2017 12:12 PM

## 2017-06-14 NOTE — Assessment & Plan Note (Signed)
She is not symptomatic except for mild fatigue We discussed neutropenic precautions

## 2017-06-14 NOTE — Assessment & Plan Note (Signed)
She is noted to have hypertension Due to high risk situation, I recommend metoprolol She agreed to proceed

## 2017-06-14 NOTE — Patient Instructions (Addendum)
[place] Discharge Instructions for Patients Receiving Chemotherapy  Today you received the following chemotherapy agents: Doxorubicin liposomal (Doxil).   To help prevent nausea and vomiting after your treatment, we encourage you to take your nausea medication as prescribed.  If you develop nausea and vomiting that is not controlled by your nausea medication, call the clinic.   BELOW ARE SYMPTOMS THAT SHOULD BE REPORTED IMMEDIATELY:  *FEVER GREATER THAN 100.5 F  *CHILLS WITH OR WITHOUT FEVER  NAUSEA AND VOMITING THAT IS NOT CONTROLLED WITH YOUR NAUSEA MEDICATION  *UNUSUAL SHORTNESS OF BREATH  *UNUSUAL BRUISING OR BLEEDING  TENDERNESS IN MOUTH AND THROAT WITH OR WITHOUT PRESENCE OF ULCERS  *URINARY PROBLEMS  *BOWEL PROBLEMS  UNUSUAL RASH Items with * indicate a potential emergency and should be followed up as soon as possible.  Feel free to call the clinic should you have any questions or concerns. The clinic phone number is [phone].  Please show the [place] at check-in to the Emergency Department and triage nurse.  Ferumoxytol injection What is this medicine? FERUMOXYTOL is an iron complex. Iron is used to make healthy red blood cells, which carry oxygen and nutrients throughout the body. This medicine is used to treat iron deficiency anemia in people with chronic kidney disease. This medicine may be used for other purposes; ask your health care provider or pharmacist if you have questions. COMMON BRAND NAME(S): Feraheme What should I tell my health care provider before I take this medicine? They need to know if you have any of these conditions: -anemia not caused by low iron levels -high levels of iron in the blood -magnetic resonance imaging (MRI) test scheduled -an unusual or allergic reaction to iron, other medicines, foods, dyes, or preservatives -pregnant or trying to get pregnant -breast-feeding How should I use this  medicine? This medicine is for injection into a vein. It is given by a health care professional in a hospital or clinic setting. Talk to your pediatrician regarding the use of this medicine in children. Special care may be needed. Overdosage: If you think you have taken too much of this medicine contact a poison control center or emergency room at once. NOTE: This medicine is only for you. Do not share this medicine with others. What if I miss a dose? It is important not to miss your dose. Call your doctor or health care professional if you are unable to keep an appointment. What may interact with this medicine? This medicine may interact with the following medications: -other iron products This list may not describe all possible interactions. Give your health care provider a list of all the medicines, herbs, non-prescription drugs, or dietary supplements you use. Also tell them if you smoke, drink alcohol, or use illegal drugs. Some items may interact with your medicine. What should I watch for while using this medicine? Visit your doctor or healthcare professional regularly. Tell your doctor or healthcare professional if your symptoms do not start to get better or if they get worse. You may need blood work done while you are taking this medicine. You may need to follow a special diet. Talk to your doctor. Foods that contain iron include: whole grains/cereals, dried fruits, beans, or peas, leafy green vegetables, and organ meats (liver, kidney). What side effects may I notice from receiving this medicine? Side effects that you should report to your doctor or health care professional as soon as possible: -allergic reactions like skin rash, itching or hives, swelling of  the face, lips, or tongue -breathing problems -changes in blood pressure -feeling faint or lightheaded, falls -fever or chills -flushing, sweating, or hot feelings -swelling of the ankles or feet Side effects that usually do not  require medical attention (report to your doctor or health care professional if they continue or are bothersome): -diarrhea -headache -nausea, vomiting -stomach pain This list may not describe all possible side effects. Call your doctor for medical advice about side effects. You may report side effects to FDA at 1-800-FDA-1088. Where should I keep my medicine? This drug is given in a hospital or clinic and will not be stored at home. NOTE: This sheet is a summary. It may not cover all possible information. If you have questions about this medicine, talk to your doctor, pharmacist, or health care provider.  2018 Elsevier/Gold Standard (2015-06-24 12:41:49)

## 2017-06-18 ENCOUNTER — Ambulatory Visit: Payer: 59 | Admitting: Pulmonary Disease

## 2017-07-03 DIAGNOSIS — Z1389 Encounter for screening for other disorder: Secondary | ICD-10-CM | POA: Diagnosis not present

## 2017-07-03 DIAGNOSIS — Z6828 Body mass index (BMI) 28.0-28.9, adult: Secondary | ICD-10-CM | POA: Diagnosis not present

## 2017-07-03 DIAGNOSIS — E041 Nontoxic single thyroid nodule: Secondary | ICD-10-CM | POA: Diagnosis not present

## 2017-07-03 DIAGNOSIS — I700 Atherosclerosis of aorta: Secondary | ICD-10-CM | POA: Diagnosis not present

## 2017-07-03 DIAGNOSIS — C541 Malignant neoplasm of endometrium: Secondary | ICD-10-CM | POA: Diagnosis not present

## 2017-07-03 DIAGNOSIS — D6489 Other specified anemias: Secondary | ICD-10-CM | POA: Diagnosis not present

## 2017-07-03 DIAGNOSIS — M8580 Other specified disorders of bone density and structure, unspecified site: Secondary | ICD-10-CM | POA: Diagnosis not present

## 2017-07-06 ENCOUNTER — Telehealth: Payer: Self-pay

## 2017-07-06 NOTE — Telephone Encounter (Signed)
Called with below message. She is going to contact her opthalmologist and get appt.

## 2017-07-06 NOTE — Telephone Encounter (Signed)
Called and left message. She currently getting Doxil. She is complaining of constant watery eyes. She read that could be a side effect. She wants recommendations from Dr. Alvy Bimler on what to do for the watery eyes.

## 2017-07-06 NOTE — Telephone Encounter (Signed)
Highly unusual I would start with getting an opinion from opthalmologist

## 2017-07-10 ENCOUNTER — Ambulatory Visit (HOSPITAL_COMMUNITY)
Admission: RE | Admit: 2017-07-10 | Discharge: 2017-07-10 | Disposition: A | Discharge status: Home / Self Care | Payer: 59 | Source: Ambulatory Visit | Attending: Hematology and Oncology | Admitting: Hematology and Oncology

## 2017-07-10 DIAGNOSIS — C541 Malignant neoplasm of endometrium: Secondary | ICD-10-CM | POA: Insufficient documentation

## 2017-07-10 DIAGNOSIS — I340 Nonrheumatic mitral (valve) insufficiency: Secondary | ICD-10-CM | POA: Diagnosis not present

## 2017-07-10 DIAGNOSIS — R590 Localized enlarged lymph nodes: Secondary | ICD-10-CM | POA: Diagnosis not present

## 2017-07-10 LAB — ECHOCARDIOGRAM LIMITED
Ao-asc: 31 cm
E decel time: 218 msec
E/e' ratio: 6.03
FS: 18 % — AB (ref 28–44)
IVS/LV PW RATIO, ED: 0.85
LA ID, A-P, ES: 39 mm
LA diam end sys: 39 mm
LA diam index: 2.11 cm/m2
LA vol A4C: 51.1 ml
LA vol index: 37.2 mL/m2
LA vol: 68.8 mL
LV E/e' medial: 6.03
LV E/e'average: 6.03
LV PW d: 14.4 mm — AB (ref 0.6–1.1)
LV e' LATERAL: 10.7 cm/s
LVOT area: 3.8 cm2
LVOT diameter: 22 mm
Lateral S' vel: 9.79 cm/s
MV Dec: 218
MV pk A vel: 83.4 m/s
MV pk E vel: 64.5 m/s
PV Reg vel dias: 90.1 cm/s
RV sys press: 19 mmHg
Reg peak vel: 203 cm/s
TDI e' lateral: 10.7
TDI e' medial: 7.07
TR max vel: 203 cm/s

## 2017-07-10 NOTE — Progress Notes (Signed)
  Echocardiogram 2D Echocardiogram has been performed.  Diane Lozano G Ellamay Fors 07/10/2017, 10:34 AM

## 2017-07-11 ENCOUNTER — Inpatient Hospital Stay: Payer: 59 | Attending: Hematology and Oncology | Admitting: Hematology and Oncology

## 2017-07-11 ENCOUNTER — Encounter: Payer: Self-pay | Admitting: Hematology and Oncology

## 2017-07-11 ENCOUNTER — Encounter (HOSPITAL_COMMUNITY)
Admission: RE | Admit: 2017-07-11 | Discharge: 2017-07-11 | Disposition: A | Discharge status: Home / Self Care | Payer: 59 | Source: Ambulatory Visit | Attending: Hematology and Oncology | Admitting: Hematology and Oncology

## 2017-07-11 ENCOUNTER — Encounter (HOSPITAL_COMMUNITY): Payer: Self-pay

## 2017-07-11 VITALS — BP 140/68 | HR 99 | Temp 98.9°F | Resp 18 | Ht 63.0 in | Wt 167.1 lb

## 2017-07-11 DIAGNOSIS — Z7982 Long term (current) use of aspirin: Secondary | ICD-10-CM | POA: Insufficient documentation

## 2017-07-11 DIAGNOSIS — E041 Nontoxic single thyroid nodule: Secondary | ICD-10-CM | POA: Insufficient documentation

## 2017-07-11 DIAGNOSIS — C541 Malignant neoplasm of endometrium: Secondary | ICD-10-CM | POA: Diagnosis not present

## 2017-07-11 DIAGNOSIS — Z9221 Personal history of antineoplastic chemotherapy: Secondary | ICD-10-CM | POA: Diagnosis not present

## 2017-07-11 DIAGNOSIS — R590 Localized enlarged lymph nodes: Secondary | ICD-10-CM | POA: Diagnosis not present

## 2017-07-11 DIAGNOSIS — C770 Secondary and unspecified malignant neoplasm of lymph nodes of head, face and neck: Secondary | ICD-10-CM | POA: Insufficient documentation

## 2017-07-11 DIAGNOSIS — Z923 Personal history of irradiation: Secondary | ICD-10-CM | POA: Insufficient documentation

## 2017-07-11 DIAGNOSIS — R946 Abnormal results of thyroid function studies: Secondary | ICD-10-CM

## 2017-07-11 DIAGNOSIS — Z79899 Other long term (current) drug therapy: Secondary | ICD-10-CM | POA: Diagnosis not present

## 2017-07-11 DIAGNOSIS — I2510 Atherosclerotic heart disease of native coronary artery without angina pectoris: Secondary | ICD-10-CM | POA: Insufficient documentation

## 2017-07-11 LAB — GLUCOSE, CAPILLARY: Glucose-Capillary: 88 mg/dL (ref 65–99)

## 2017-07-11 MED ORDER — FLUDEOXYGLUCOSE F - 18 (FDG) INJECTION
8.2000 | Freq: Once | INTRAVENOUS | Status: AC | PRN
  Administered 2017-07-11: 8.2 via INTRAVENOUS

## 2017-07-11 NOTE — Assessment & Plan Note (Signed)
She has new hypermetabolic activity in the right supraclavicular region We will pursue biopsy as above It is not clear to me whether this lymph node is related to metastatic uterine cancer versus thyroid cancer

## 2017-07-11 NOTE — Assessment & Plan Note (Signed)
Previous biopsy in 2017 was negative for malignancy With new changes, we will proceed with biopsy as above

## 2017-07-11 NOTE — Progress Notes (Signed)
[place] [facility] OFFICE PROGRESS NOTE  Patient Care Team: [name], MD as PCP - General (Family Medicine) Reynold Bowen, MD as Consulting Physician (Endocrinology)  SUMMARY OF ONCOLOGIC HISTORY: Oncology History   [employer] testing done 11-2015 on surgical path from 2014: MS stable TMB low 4 muts/mb ATM [id number] ERBB3 T389K E2H2 rearrangement exon 9 PPP2R1A P179R TP53 I195N    ER- APPROXIMATELY 25-35% STAINING IN NEOPLASTIC CELLS (INTERMEDIATE)  PR- APPROXIMATELY 25-35% STAINING IN NEOPLASTIC CELLS (STRONG)  Repeat biopsy 04/02/17: ER negative, Her 2 negative     Endometrial cancer ([place])   06/20/2012 Pathology Results    Biopsy positive for papillary serous carcinoma      06/20/2012 Genetic Testing    [employer] testing done 11-2015 on surgical path from 2014: MS stable TMB low 4 muts/mb ATM [id number] ERBB3 T389K E2H2 rearrangement exon 9 PPP2R1A P179R TP53 I195N       06/20/2012 Initial Diagnosis    Patient presented to PCP with intermittent vaginal bleeding since ~ Oct 2013, endometrial biopsy 06-20-12 with complex endometrial hyperplasia with atypia      06/26/2012 Imaging    Thickened endometrial lining in a postmenopausal patient experiencing vaginal bleeding. In the setting of post-menopausal bleeding, endometrial sampling is indicated to exclude carcinoma. No focal myometrial abnormalities are seen.  Normal left ovary and non-visualized right ovary      07/30/2012 Surgery    Dr. Polly Cobia performed robotic hysterectomy with bilateral salpingo-oophorectomy, bilateral pelvic lymph node dissection and periaortic lymph node dissection. Intraoperatively on frozen section, the patient was noted to have a large endometrial polyp with changes within the polyp consistent for high-grade malignancy, possibly papillary serous carcinoma. There is no obvious extrauterine disease noted.        07/30/2012 Pathology Results     [phone] SUPPLEMENTAL REPORT  THE ENDOMETRIAL CARCINOMA WAS ANALYZED FOR DNA MISMATCH REPAIR PROTEINS.IMMUNOHISTOCHEMICALLY, THE NEOPLASM RETAINED NUCLEAR EXPRESSION OF 4 GENE PRODUCTS, MLH1, MSH2, MSH6, AND PMS2, INVOLVED IN DNA MISMATCH REPAIR.POSITIVE AND NEGATIVE CONTROLS WORKED APPROPRIATELY.  PER REQUEST, AN ER AND PR ARE PERFORMED ON BLOCK 1I.  ER- APPROXIMATELY 25-35% STAINING IN NEOPLASTIC CELLS (INTERMEDIATE)  PR- APPROXIMATELY 25-35% STAINING IN NEOPLASTIC CELLS (STRONG)  ER AND PR PREDOMINANTLY SHOW STAINING IN THE SEROUS COMPONENT.  DIAGNOSIS  1. UTERUS, CERVIX WITH BILATERAL FALLOPIAN TUBES AND OVARIES,  HYSTERECTOMY AND BILATERAL SALPINGO-OOPHORECTOMY:  HIGH GRADE/POORLY DIFFERENTIATED ENDOMETRIAL ADENOCARCINOMA WITH SOLID AND SEROUS PAPILLARY COMPONENTS.  HISTOPATHOLOGIC TYPE:A VARIETY OF PATTERNS WERE PRESENT, ALL OF WHICH SHOULD BE CONSIDERED TO BE HIGH GRADE.SEROUS PAPILLARY CARCINOMA WAS SEEN OCCURRING ADJACENT TO SOLID ENDOMETRIAL CARCINOMA WITH MARKED ANAPLASIA AND GIANT CELLS. SIZE:TUMOR MEASURED AT LEAST 4.8 CM IN GREATEST HORIZONTAL DIMENSION.  GRADE: POORLY DIFFERENTIATED OR HIGH GRADE. DEPTH OF INVASION: NO DEFINITE MYOMETRIAL INVOLVEMENT WAS SEEN.THE TUMOR APPEARED CONFINED TO THE POLYP AS WELL AS SURFACE ENDOMETRIUM. WHERE MYOMETRIAL THICKNESS NOT APPLICABLE. SEROSAL INVOLVEMENT: NOT DEMONSTRATED.  ENDOCERVICAL INVOLVEMENT:NOT DEMONSTRATED. RESECTION MARGINS: FREE OF INVOLVEMENT. EXTRAUTERINE EXTENSION:NOT DEMONSTRATED. ANGIOLYMPHATIC INVASION: NOT DEFINITELY SEEN. TOTAL NODES EXAMINED:22.  PELVIC NODES EXAMINED:20.  PELVIC NODES INVOLVED:0.  PARA-AORTIC NODES 2. EXAMINED:  PARA-AORTIC NODES 0. INVOLVED TNM STAGE: T1A N0 MX. AJCC STAGE GROUPING: IA. FIGO STAGE:IA.      08/26/2012 Imaging    No CT evidence for intra-abdominal or pelvic metastatic  disease. Trace free pelvic fluid, presumably postoperative although the date of surgery is not documented in the electronic medical record.      08/26/2012 - 12/13/2012 Chemotherapy    She received 6  cycles of carbo/taxol       10/29/2012 - 11/27/2012 Radiation Therapy    May 27, June 5,  June 11, June 19, November 27, 2012: Proximal vagina 30 Gy in 5 fractions        01/17/2013 Imaging    1.  No evidence of recurrent or metastatic disease. 2.  No acute abnormality involving the abdomen or pelvis. 3.  Mild diffuse hepatic steatosis. 4.  Very small supraumbilical midline anterior abdominal wall hernia containing fat, unchanged      01/22/2014 Imaging    No evidence for metastatic or recurrent disease. 2. No bowel obstruction.  Normal appendix. 3. Small fat containing hernia, stable in appearance. 4. Status post hysterectomy and bilateral oophorectomy      02/19/2014 Imaging    No pulmonary lesions are identified. 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 No other sites of metastatic disease identified within the abdomen or pelvis.        11/27/2014 - 02/11/2015 Chemotherapy    She received 4 cycles of carbo/taxol       03/01/2015 PET scan    Single hypermetabolic small retroperitoneal lymph node along the aorta. 2. No evidence of metastatic disease otherwise in the abdomen or pelvis. No evidence local recurrence. 3. Intensely hypermetabolic enlarged nodule adjacent to the RIGHT lobe of thyroid gland. This presumably represents the biopsied lesion in clinician report which was found to be benign thyroid tissue.      04/05/2015 - 05/13/2015 Radiation Therapy    She received 50.4 gray in 28 fractions with simultaneous integrated boost to 56 gray       06/17/2015 Imaging    No acute process or evidence of metastatic disease in the abdomen or pelvis. Resolution of previously described retroperitoneal adenopathy. 2.  Possible constipation. 3. Atherosclerosis.      06/17/2015 Tumor Marker    Patient's tumor was tested for the following markers: CA125 Results of the tumor marker test revealed 33      08/12/2015 Tumor Marker    Patient's tumor was tested for the following markers: CA125 Results of the tumor marker test revealed 52      08/31/2015 PET scan    Development of right paratracheal hypermetabolic adenopathy, consistent with nodal metastasis. 2. The previously described isolated abdominal retroperitoneal hypermetabolic node has resolved. 3. Persistent hypermetabolic right thyroid nodule, per report previously biopsied. Correlate with those results. 4.  Possible constipation.      09/09/2015 -  Anti-estrogen oral therapy    She has been receiving alternative treatment between megace and tamoxifen      09/09/2015 Tumor Marker    Patient's tumor was tested for the following markers: CA125 Results of the tumor marker test revealed 37.8      09/20/2015 Procedure    Technically successful ultrasound-guided thyroid aspiration biopsy , dominant right nodule      09/20/2015 Pathology Results    THYROID, RIGHT, FINE NEEDLE ASPIRATION (SPECIMEN 1 OF 1 COLLECTED 09/20/15): FINDINGS CONSISTENT WITH BENIGN THYROID NODULE (BETHESDA CATEGORY II).      10/28/2015 Tumor Marker    Patient's tumor was tested for the following markers: CA125 Results of the tumor marker test revealed 53.2      11/04/2015 Imaging    Stable benign right thyroid nodule  12/16/2015 Tumor Marker    Patient's tumor was tested for the following markers: CA125 Results of the tumor marker test revealed 38.1      03/22/2016 PET scan    Interval progression of hypermetabolic right paratracheal lymph node consistent with metastatic involvement. 2. Stable hypermetabolic  right thyroid nodule. Reportedly this has been biopsied in the past.      03/23/2016 Tumor Marker    Patient's tumor was tested for the following markers: CA125 Results of the tumor marker test revealed 62.4      04/13/2016 - 06/01/2016 Radiation Therapy    She received 56 Gy to the chest in 28 fractions       05/09/2016 Imaging    No evidence of left lower extremity deep vein thrombosis. No evidence of a superficial thrombosis of the greater and lesser saphenous veins. Positive for thrombus noted in several varicosities of the calf. No evidence of Baker's cyst on the left.      05/17/2016 Tumor Marker    Patient's tumor was tested for the following markers: CA125 Results of the tumor marker test revealed 9      06/29/2016 Tumor Marker    Patient's tumor was tested for the following markers: CA125 Results of the tumor marker test revealed 5.9      08/04/2016 Tumor Marker    Patient's tumor was tested for the following markers: CA125 Results of the tumor marker test revealed 6.0      09/12/2016 PET scan    Complete metabolic response to therapy, with resolution of hypermetabolic mediastinal lymphadenopathy since prior exam. No residual or new metastatic disease identified. Stable hypermetabolic right thyroid lobe nodule, which was previously biopsied on 09/20/2015.      03/13/2017 PET scan    1. Solitary focus of recurrent right paratracheal hypermetabolic activity, with a 1.0 cm right lower paratracheal node having a maximum SUV of 9.0. Appearance compatible with recurrent malignancy. 2. Continued hypermetabolic right thyroid nodule, previously biopsied, presumed benign -correlate with prior biopsy results. 3. Other imaging findings of potential clinical significance: Aortic Atherosclerosis (ICD10-I70.0). Mild cardiomegaly. Prominent stool throughout the colon favors constipation.      04/02/2017 Pathology Results    FINE NEEDLE ASPIRATION, ENDOSCOPIC, (EBUS) 4 R NODE (SPECIMEN 1  OF 2 COLLECTED 04/02/17): MALIGNANT CELLS PRESENT, CONSISTENT WITH CARCINOMA. SEE COMMENT. COMMENT: THE MALIGNANT CELLS ARE POSITIVE FOR P53 AND NEGATIVE FOR ESTROGEN RECEPTOR AND TTF-1. THIS PROFILE IS NON-SPECIFIC, BUT THE P53 POSITIVE STAINING IS SUGGESTIVE OF GYNECOLOGIC PRIMARY.       04/11/2017 Procedure    Successful 8 French right internal jugular vein power port placement with its tip at the SVC/RA junction      04/12/2017 Imaging    Normal LV size with mild LV hypertrophy. EF 55-60%. Normal RV size and systolic function. Aortic valve sclerosis without significant stenosis.      04/18/2017 - 06/14/2017 Chemotherapy    The patient had chemotherapy with Doxil       07/10/2017 Imaging    - Left ventricle: The cavity size was normal. There was mild concentric hypertrophy. Systolic function was normal. The estimated ejection fraction was in the range of 55% to 60%. Wall motion was normal; there were no regional wall motion abnormalities. Doppler parameters are consistent with abnormal left ventricular relaxation (grade 1 diastolic dysfunction). - Aortic valve: Noncoronary cusp mobility was mildly restricted. - Mitral valve: There was mild regurgitation. - Left atrium: The atrium was mildly dilated. - Atrial septum: No defect or patent  foramen ovale was identified.  Impressions:  - Compared to November 2018, global LV longitudinal strain remains normal (has increased)      07/11/2017 PET scan    Increased size and hypermetabolic activity of 3.3 cm right thyroid lobe nodule. Thyroid carcinoma cannot be excluded. Recommend repeat ultrasound guided fine needle aspiration to exclude thyroid carcinoma.  New adjacent hypermetabolic 10 mm right supraclavicular lymph node, suspicious for lymph node metastasis.  Slight increase in size and hypermetabolic activity of solitary right paratracheal lymph node.  No evidence of abdominal or pelvic metastatic disease.       INTERVAL  HISTORY: Please see below for problem oriented charting. She is seen with her husband today She has no new symptoms She tolerated last cycle of chemotherapy well She had mild cough but nonproductive  REVIEW OF SYSTEMS:   Constitutional: Denies fevers, chills or abnormal weight loss Eyes: Denies blurriness of vision Ears, nose, mouth, throat, and face: Denies mucositis or sore throat Respiratory: Denies cough, dyspnea or wheezes Cardiovascular: Denies palpitation, chest discomfort or lower extremity swelling Gastrointestinal:  Denies nausea, heartburn or change in bowel habits Skin: Denies abnormal skin rashes Lymphatics: Denies new lymphadenopathy or easy bruising Neurological:Denies numbness, tingling or new weaknesses Behavioral/Psych: Mood is stable, no new changes  All other systems were reviewed with the patient and are negative.  I have reviewed the past medical history, past surgical history, social history and family history with the patient and they are unchanged from previous note.  ALLERGIES:  has No Known Allergies.  MEDICATIONS:  Current Outpatient Medications  Medication Sig Dispense Refill  . aspirin EC 81 MG tablet Take 81 mg by mouth daily.    . Calcium Carb-Cholecalciferol 500-600 MG-UNIT TABS Take 1 tablet by mouth daily.     . Cholecalciferol (VITAMIN D3) 2000 units TABS Take 2,000 Units by mouth daily.     Marland Kitchen ibuprofen (ADVIL,MOTRIN) 200 MG tablet Take 200 mg by mouth daily as needed for headache or moderate pain.    Marland Kitchen lidocaine-prilocaine (EMLA) cream Apply to affected area once 30 g 3  . metoprolol tartrate (LOPRESSOR) 25 MG tablet Take 1 tablet (25 mg total) by mouth 2 (two) times daily. 60 tablet 3  . ondansetron (ZOFRAN) 8 MG tablet Take 1 tablet (8 mg total) by mouth 2 (two) times daily as needed (Nausea or vomiting). 30 tablet 1  . prochlorperazine (COMPAZINE) 10 MG tablet Take 1 tablet (10 mg total) by mouth every 6 (six) hours as needed (Nausea or  vomiting). 30 tablet 1  . simvastatin (ZOCOR) 40 MG tablet Take 40 mg by mouth every evening.    . tacrolimus (PROTOPIC) 0.1 % ointment Apply 1 application topically daily as needed (rash).     . venlafaxine XR (EFFEXOR-XR) 75 MG 24 hr capsule Take 75 mg by mouth daily with breakfast.      No current facility-administered medications for this visit.     PHYSICAL EXAMINATION: ECOG PERFORMANCE STATUS: 1 - Symptomatic but completely ambulatory  Vitals:   07/11/17 1356  BP: 140/68  Pulse: 99  Resp: 18  Temp: 98.9 F (37.2 C)  SpO2: 98%   Filed Weights   07/11/17 1356  Weight: 167 lb 2 oz (75.8 kg)    GENERAL:alert, no distress and comfortable SKIN: skin color, texture, turgor are normal, no rashes or significant lesions EYES: normal, Conjunctiva are pink and non-injected, sclera clear OROPHARYNX:no exudate, no erythema and lips, buccal mucosa, and tongue normal  NECK: The right thyroid nodule  is palpable but I could not appreciate the lymphadenopathy  lYMPH:  no palpable lymphadenopathy in the cervical, axillary or inguinal LUNGS: clear to auscultation and percussion with normal breathing effort HEART: regular rate & rhythm and no murmurs and no lower extremity edema ABDOMEN:abdomen soft, non-tender and normal bowel sounds Musculoskeletal:no cyanosis of digits and no clubbing  NEURO: alert & oriented x 3 with fluent speech, no focal motor/sensory deficits  LABORATORY DATA:  I have reviewed the data as listed    Component Value Date/Time   NA 141 06/14/2017 0852   NA 141 05/17/2017 0957   K 3.8 06/14/2017 0852   K 3.6 05/17/2017 0957   CL 108 06/14/2017 0852   CL 105 11/19/2012 0848   CO2 27 06/14/2017 0852   CO2 25 05/17/2017 0957   GLUCOSE 76 06/14/2017 0852   GLUCOSE 85 05/17/2017 0957   GLUCOSE 122 (H) 11/19/2012 0848   BUN 15 06/14/2017 0852   BUN 15.2 05/17/2017 0957   CREATININE 0.80 06/14/2017 0852   CREATININE 0.8 05/17/2017 0957   CALCIUM 9.7 06/14/2017  0852   CALCIUM 9.6 05/17/2017 0957   PROT 6.7 06/14/2017 0852   PROT 7.0 05/17/2017 0957   ALBUMIN 3.7 06/14/2017 0852   ALBUMIN 3.9 05/17/2017 0957   AST 24 06/14/2017 0852   AST 25 05/17/2017 0957   ALT 30 06/14/2017 0852   ALT 18 05/17/2017 0957   ALKPHOS 52 06/14/2017 0852   ALKPHOS 57 05/17/2017 0957   BILITOT 0.5 06/14/2017 0852   BILITOT 0.47 05/17/2017 0957   GFRNONAA >60 06/14/2017 0852   GFRAA >60 06/14/2017 0852    No results found for: SPEP, UPEP  Lab Results  Component Value Date   WBC 2.4 (L) 06/14/2017   NEUTROABS 1.4 (L) 06/14/2017   HGB 10.9 (L) 06/14/2017   HCT 31.4 (L) 06/14/2017   MCV 97.1 06/14/2017   PLT 202 06/14/2017      Chemistry      Component Value Date/Time   NA 141 06/14/2017 0852   NA 141 05/17/2017 0957   K 3.8 06/14/2017 0852   K 3.6 05/17/2017 0957   CL 108 06/14/2017 0852   CL 105 11/19/2012 0848   CO2 27 06/14/2017 0852   CO2 25 05/17/2017 0957   BUN 15 06/14/2017 0852   BUN 15.2 05/17/2017 0957   CREATININE 0.80 06/14/2017 0852   CREATININE 0.8 05/17/2017 0957   GLU 158 (H) 01/14/2015 1035      Component Value Date/Time   CALCIUM 9.7 06/14/2017 0852   CALCIUM 9.6 05/17/2017 0957   ALKPHOS 52 06/14/2017 0852   ALKPHOS 57 05/17/2017 0957   AST 24 06/14/2017 0852   AST 25 05/17/2017 0957   ALT 30 06/14/2017 0852   ALT 18 05/17/2017 0957   BILITOT 0.5 06/14/2017 0852   BILITOT 0.47 05/17/2017 0957       RADIOGRAPHIC STUDIES: I have reviewed this in great detail with the patient and her husband I have personally reviewed the radiological images as listed and agreed with the findings in the report. Nm Pet Image Restag (ps) Skull Base To Thigh  Result Date: 07/11/2017 CLINICAL DATA:  Subsequent treatment strategy for endometrial carcinoma. EXAM: NUCLEAR MEDICINE PET SKULL BASE TO THIGH TECHNIQUE: 8.2 mCi F-18 FDG was injected intravenously. Full-ring PET imaging was performed from the skull base to thigh after the  radiotracer. CT data was obtained and used for attenuation correction and anatomic localization. FASTING BLOOD GLUCOSE:  Value: 88 mg/dl COMPARISON:  03/13/2017 FINDINGS: NECK: A 3.3 cm right thyroid lobe nodule on image 52/4 has increased in size from 2.9 cm previously. Current SUV max measures 9.3, compared to 7.8 previously. Adjacent 10 mm right supraclavicular lymph node is seen on image 55/4, increased in size from 5 mm previously. This shows new hypermetabolic activity with SUV max of 5.7. No other hypermetabolic cervical lymph nodes identified. CHEST: A right paratracheal mediastinal lymph node measures 15 mm on image 66/for, compared to 12 mm previously. This has SUV max of 9.7, compared to 9.0 previously. No other hypermetabolic lymph nodes identified within the thorax. No suspicious pulmonary nodules seen on CT images. Stable scarring in paramediastinal region of right upper lobe, suggesting previous radiation therapy. ABDOMEN/PELVIS: No abnormal hypermetabolic activity within the liver, pancreas, adrenal glands, or spleen. No hypermetabolic lymph nodes in the abdomen or pelvis. Prior hysterectomy again noted. SKELETON: No focal hypermetabolic bone lesions to suggest skeletal metastasis. IMPRESSION: Increased size and hypermetabolic activity of 3.3 cm right thyroid lobe nodule. Thyroid carcinoma cannot be excluded. Recommend repeat ultrasound guided fine needle aspiration to exclude thyroid carcinoma. New adjacent hypermetabolic 10 mm right supraclavicular lymph node, suspicious for lymph node metastasis. Slight increase in size and hypermetabolic activity of solitary right paratracheal lymph node. No evidence of abdominal or pelvic metastatic disease. Electronically Signed   By: Earle Gell M.D.   On: 07/11/2017 11:17    ASSESSMENT & PLAN:  Endometrial cancer ([place]) Unfortunately, PET/CT scan showed disease progression Even though the mediastinum lymph node is stable, there is new lymphadenopathy in  the supraclavicular region, along with enlarging right thyroid nodule, worrisome for disease I recommend stopping chemotherapy for now I recommend pursuing ultrasound-guided lymph node and thyroid biopsy at the same time I have discussed this with the radiologist and he felt it is feasible I have also discussed with her endocrinologist that has been following up the thyroid nodule and he agreed that this is the best course of action I have attempted to call her GYN surgeon for further recommendation but she is not around and she will call me back once she is back to work tomorrow For now, the patient is asymptomatic  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 [place] CORE BIOPSY (THYROID)    Standing Status:   Future    Standing Expiration Date:   09/09/2018    Order Specific Question:   Reason for Exam (SYMPTOM  OR DIAGNOSIS REQUIRED)    Answer:   biopsy right thyroid    Order Specific Question:   Preferred imaging location?    Answer:   [facility]  . [place] CORE BIOPSY (LYMPH NODES)    Standing Status:   Future    Standing Expiration Date:   09/09/2018    Order Specific Question:   Lab orders requested (DO NOT place separate lab orders, these will be automatically ordered during procedure specimen collection):    Answer:   Surgical Pathology    Order Specific Question:   Reason for Exam (SYMPTOM  OR DIAGNOSIS REQUIRED)    Answer:   biopsy right supraclav LN seen next thyroid  Order Specific Question:   Preferred imaging location?    Answer:   [facility]   All questions were answered.  The patient knows to call the clinic with any problems, questions or concerns. No barriers to learning was detected. I spent 40 minutes counseling the patient face to face. The total time spent in the appointment was 55 minutes and more than 50% was on counseling and review of test results     [name], MD 07/11/2017 5:27 PM

## 2017-07-11 NOTE — Assessment & Plan Note (Signed)
Unfortunately, PET/CT scan showed disease progression Even though the mediastinum lymph node is stable, there is new lymphadenopathy in the supraclavicular region, along with enlarging right thyroid nodule, worrisome for disease I recommend stopping chemotherapy for now I recommend pursuing ultrasound-guided lymph node and thyroid biopsy at the same time I have discussed this with the radiologist and he felt it is feasible I have also discussed with her endocrinologist that has been following up the thyroid nodule and he agreed that this is the best course of action I have attempted to call her GYN surgeon for further recommendation but she is not around and she will call me back once she is back to work tomorrow For now, the patient is asymptomatic

## 2017-07-12 ENCOUNTER — Ambulatory Visit: Payer: 59

## 2017-07-12 ENCOUNTER — Other Ambulatory Visit: Payer: 59

## 2017-07-12 ENCOUNTER — Ambulatory Visit: Payer: 59 | Admitting: Hematology and Oncology

## 2017-07-17 ENCOUNTER — Other Ambulatory Visit: Payer: Self-pay | Admitting: Student

## 2017-07-17 MED FILL — METOPROLOL TARTRATE 25 MG T: 25 | 30 days supply | Qty: 60 | Fill #1

## 2017-07-18 ENCOUNTER — Telehealth: Payer: Self-pay | Admitting: *Deleted

## 2017-07-18 ENCOUNTER — Encounter (HOSPITAL_COMMUNITY): Payer: Self-pay

## 2017-07-18 ENCOUNTER — Ambulatory Visit (HOSPITAL_COMMUNITY)
Admission: RE | Admit: 2017-07-18 | Discharge: 2017-07-18 | Disposition: A | Discharge status: Home / Self Care | Payer: 59 | Source: Ambulatory Visit | Attending: Hematology and Oncology | Admitting: Hematology and Oncology

## 2017-07-18 DIAGNOSIS — C770 Secondary and unspecified malignant neoplasm of lymph nodes of head, face and neck: Secondary | ICD-10-CM | POA: Insufficient documentation

## 2017-07-18 DIAGNOSIS — Z8542 Personal history of malignant neoplasm of other parts of uterus: Secondary | ICD-10-CM | POA: Diagnosis not present

## 2017-07-18 DIAGNOSIS — C801 Malignant (primary) neoplasm, unspecified: Secondary | ICD-10-CM | POA: Insufficient documentation

## 2017-07-18 DIAGNOSIS — R590 Localized enlarged lymph nodes: Secondary | ICD-10-CM | POA: Diagnosis not present

## 2017-07-18 DIAGNOSIS — E079 Disorder of thyroid, unspecified: Secondary | ICD-10-CM | POA: Diagnosis not present

## 2017-07-18 DIAGNOSIS — E041 Nontoxic single thyroid nodule: Secondary | ICD-10-CM | POA: Insufficient documentation

## 2017-07-18 MED ORDER — LIDOCAINE-EPINEPHRINE 1 %-1:100000 IJ SOLN
INTRAMUSCULAR | Status: AC
  Filled 2017-07-18: qty 1

## 2017-07-18 MED ORDER — SODIUM CHLORIDE 0.9 % IV SOLN: INTRAVENOUS | Status: DC

## 2017-07-18 NOTE — Progress Notes (Signed)
Patient presents for biopsies today.  She requests no sedation.  She has no complaints and does not take blood thinners.  Discussed with Dr. Pascal Lux; no labs needed.  Will proceed as scheduled without sedation.   Brynda Greathouse, MS RD PA-C 12:14 PM

## 2017-07-18 NOTE — Procedures (Signed)
Pre Procedure Dx: Hypermetabolic R thyroid nodule and R lower cervical LN Post Procedural Dx: Same  Technically successful US guided FNA of hypermetabolic R thyroid nodule. Technically successful US guided core needle biopsy of hypermetabolic right lower cervical LN.  EBL: None  No immediate complications.   Ronny Bacon, MD Pager #: [phone]

## 2017-07-18 NOTE — Telephone Encounter (Signed)
-----   Message from [name], MD sent at 07/18/2017  8:49 AM EST ----- Regarding: biopsy She is scheduled for biopsy today I recommend seeing her Monday 2/18 at 245 pm If OK with her, send scheduling msg. 30 mins

## 2017-07-18 NOTE — Discharge Instructions (Signed)
Needle Biopsy, Care After °These instructions give you information about caring for yourself after your procedure. Your doctor may also give you more specific instructions. Call your doctor if you have any problems or questions after your procedure. °Follow these instructions at home: °· Rest as told by your doctor. °· Take medicines only as told by your doctor. °· There are many different ways to close and cover the biopsy site, including stitches (sutures), skin glue, and adhesive strips. Follow instructions from your doctor about: °? How to take care of your biopsy site. °? When and how you should change your bandage (dressing). °? When you should remove your dressing. °? Removing whatever was used to close your biopsy site. °· Check your biopsy site every day for signs of infection. Watch for: °? Redness, swelling, or pain. °? Fluid, blood, or pus. °Contact a doctor if: °· You have a fever. °· You have redness, swelling, or pain at the biopsy site, and it lasts longer than a few days. °· You have fluid, blood, or pus coming from the biopsy site. °· You feel sick to your stomach (nauseous). °· You throw up (vomit). °Get help right away if: °· You are short of breath. °· You have trouble breathing. °· Your chest hurts. °· You feel dizzy or you pass out (faint). °· You have bleeding that does not stop with pressure or a bandage. °· You cough up blood. °· Your belly (abdomen) hurts. °This information is not intended to replace advice given to you by your health care provider. Make sure you discuss any questions you have with your health care provider. °Document Released: 05/04/2008 Document Revised: 10/28/2015 Document Reviewed: 05/18/2014 °[employer] Interactive Patient Education © 2018 [employer] ° °

## 2017-07-18 NOTE — Telephone Encounter (Signed)
Notified of message below. Pt will come Monday @ 2:45- message to scheduler

## 2017-07-19 ENCOUNTER — Encounter: Payer: Self-pay | Admitting: Hematology and Oncology

## 2017-07-19 ENCOUNTER — Inpatient Hospital Stay (HOSPITAL_BASED_OUTPATIENT_CLINIC_OR_DEPARTMENT_OTHER): Payer: 59 | Admitting: Hematology and Oncology

## 2017-07-19 DIAGNOSIS — Z9221 Personal history of antineoplastic chemotherapy: Secondary | ICD-10-CM

## 2017-07-19 DIAGNOSIS — C541 Malignant neoplasm of endometrium: Secondary | ICD-10-CM

## 2017-07-19 DIAGNOSIS — I2510 Atherosclerotic heart disease of native coronary artery without angina pectoris: Secondary | ICD-10-CM | POA: Diagnosis not present

## 2017-07-19 DIAGNOSIS — Z7982 Long term (current) use of aspirin: Secondary | ICD-10-CM | POA: Diagnosis not present

## 2017-07-19 DIAGNOSIS — C770 Secondary and unspecified malignant neoplasm of lymph nodes of head, face and neck: Secondary | ICD-10-CM | POA: Diagnosis not present

## 2017-07-19 DIAGNOSIS — E041 Nontoxic single thyroid nodule: Secondary | ICD-10-CM

## 2017-07-19 DIAGNOSIS — Z923 Personal history of irradiation: Secondary | ICD-10-CM

## 2017-07-19 DIAGNOSIS — Z79899 Other long term (current) drug therapy: Secondary | ICD-10-CM | POA: Diagnosis not present

## 2017-07-19 NOTE — Assessment & Plan Note (Signed)
Biopsy indeterminate. Further tests in progress I will call her next week once pathology is finalized. I do not want to start her on Avastin until we have ruled out surgical treatment for her thyroid nodule

## 2017-07-19 NOTE — Progress Notes (Signed)
[place] OFFICE PROGRESS NOTE  Patient Care Team: [name], MD as PCP - General (Family Medicine) Reynold Bowen, MD as Consulting Physician (Endocrinology)  ASSESSMENT & PLAN:  Endometrial cancer [facility]) Unfortunately, recent biopsy confirmed cancer progression. We discussed options and reviewed current NCCN guidelines. Options discussed included enrollment in clinical trial at [facility], single agent Avastin, repeat treatment with carboplatin/taxol or single agents carboplatin or taxol She is undecided. I recommend regroup again next week once we have final pathology from thyroid biopsy In the mean time, she is not symptomatic  Right thyroid nodule Biopsy indeterminate. Further tests in progress I will call her next week once pathology is finalized. I do not want to start her on Avastin until we have ruled out surgical treatment for her thyroid nodule   No orders of the defined types were placed in this encounter.   INTERVAL HISTORY: Please see below for problem oriented charting. She returns today to discuss biopsy report. In the mean time, she is not symptomatic She tolerated biopsy well. No side-effects from recent chemo  SUMMARY OF ONCOLOGIC HISTORY: [place] [facility] testing done 11-2015 on surgical path from 2014: MS stable TMB low 4 muts/mb ATM [id number] ERBB3 T389K E2H2 rearrangement exon 9 PPP2R1A P179R TP53 I195N    ER- APPROXIMATELY 25-35% STAINING IN NEOPLASTIC CELLS (INTERMEDIATE)  PR- APPROXIMATELY 25-35% STAINING IN NEOPLASTIC CELLS (STRONG)  Repeat biopsy 04/02/17: ER negative, Her 2 negative     Endometrial cancer ([place])   06/20/2012 Pathology Results    Biopsy positive for papillary serous carcinoma      06/20/2012 Genetic Testing    [facility] testing done 11-2015 on surgical path from 2014: MS stable TMB low 4 muts/mb ATM [id number] ERBB3 T389K E2H2 rearrangement exon 9 PPP2R1A  P179R TP53 I195N       06/20/2012 Initial Diagnosis    Patient presented to PCP with intermittent vaginal bleeding since ~ Oct 2013, endometrial biopsy 06-20-12 with complex endometrial hyperplasia with atypia      06/26/2012 Imaging    Thickened endometrial lining in a postmenopausal patient experiencing vaginal bleeding. In the setting of post-menopausal bleeding, endometrial sampling is indicated to exclude carcinoma. No focal myometrial abnormalities are seen.  Normal left ovary and non-visualized right ovary      07/30/2012 Surgery    Dr. Polly Cobia performed robotic hysterectomy with bilateral salpingo-oophorectomy, bilateral pelvic lymph node dissection and periaortic lymph node dissection. Intraoperatively on frozen section, the patient was noted to have a large endometrial polyp with changes within the polyp consistent for high-grade malignancy, possibly papillary serous carcinoma. There is no obvious extrauterine disease noted.        07/30/2012 Pathology Results    [phone] SUPPLEMENTAL REPORT  THE ENDOMETRIAL CARCINOMA WAS ANALYZED FOR DNA MISMATCH REPAIR PROTEINS.IMMUNOHISTOCHEMICALLY, THE NEOPLASM RETAINED NUCLEAR EXPRESSION OF 4 GENE PRODUCTS, MLH1, MSH2, MSH6, AND PMS2, INVOLVED IN DNA MISMATCH REPAIR.POSITIVE AND NEGATIVE CONTROLS WORKED APPROPRIATELY.  PER REQUEST, AN ER AND PR ARE PERFORMED ON BLOCK 1I.  ER- APPROXIMATELY 25-35% STAINING IN NEOPLASTIC CELLS (INTERMEDIATE)  PR- APPROXIMATELY 25-35% STAINING IN NEOPLASTIC CELLS (STRONG)  ER AND PR PREDOMINANTLY SHOW STAINING IN THE SEROUS COMPONENT.  DIAGNOSIS  1. UTERUS, CERVIX WITH BILATERAL FALLOPIAN TUBES AND OVARIES,  HYSTERECTOMY AND BILATERAL SALPINGO-OOPHORECTOMY:  HIGH GRADE/POORLY DIFFERENTIATED ENDOMETRIAL ADENOCARCINOMA WITH SOLID AND SEROUS PAPILLARY COMPONENTS.  HISTOPATHOLOGIC TYPE:A VARIETY OF PATTERNS WERE PRESENT, ALL OF WHICH SHOULD BE CONSIDERED TO BE HIGH GRADE.SEROUS PAPILLARY CARCINOMA  WAS SEEN OCCURRING ADJACENT TO  SOLID ENDOMETRIAL CARCINOMA WITH MARKED ANAPLASIA AND GIANT CELLS. SIZE:TUMOR MEASURED AT LEAST 4.8 CM IN GREATEST HORIZONTAL DIMENSION.  GRADE: POORLY DIFFERENTIATED OR HIGH GRADE. DEPTH OF INVASION: NO DEFINITE MYOMETRIAL INVOLVEMENT WAS SEEN.THE TUMOR APPEARED CONFINED TO THE POLYP AS WELL AS SURFACE ENDOMETRIUM. WHERE MYOMETRIAL THICKNESS NOT APPLICABLE. SEROSAL INVOLVEMENT: NOT DEMONSTRATED.  ENDOCERVICAL INVOLVEMENT:NOT DEMONSTRATED. RESECTION MARGINS: FREE OF INVOLVEMENT. EXTRAUTERINE EXTENSION:NOT DEMONSTRATED. ANGIOLYMPHATIC INVASION: NOT DEFINITELY SEEN. TOTAL NODES EXAMINED:22.  PELVIC NODES EXAMINED:20.  PELVIC NODES INVOLVED:0.  PARA-AORTIC NODES 2. EXAMINED:  PARA-AORTIC NODES 0. INVOLVED TNM STAGE: T1A N0 MX. AJCC STAGE GROUPING: IA. FIGO STAGE:IA.      08/26/2012 Imaging    No CT evidence for intra-abdominal or pelvic metastatic disease. Trace free pelvic fluid, presumably postoperative although the date of surgery is not documented in the electronic medical record.      08/26/2012 - 12/13/2012 Chemotherapy    She received 6 cycles of carbo/taxol       10/29/2012 - 11/27/2012 Radiation Therapy    May 27, June 5,  June 11, June 19, November 27, 2012: Proximal vagina 30 Gy in 5 fractions        01/17/2013 Imaging    1.  No evidence of recurrent or metastatic disease. 2.  No acute abnormality involving the abdomen or pelvis. 3.  Mild diffuse hepatic steatosis. 4.  Very small supraumbilical midline anterior abdominal wall hernia containing fat, unchanged      01/22/2014 Imaging    No evidence for metastatic or recurrent disease. 2. No bowel obstruction.  Normal appendix. 3. Small fat containing hernia, stable in appearance. 4. Status post hysterectomy and bilateral oophorectomy      02/19/2014 Imaging    No pulmonary lesions are identified. 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 No other sites of metastatic disease identified within the abdomen or pelvis.        11/27/2014 - 02/11/2015 Chemotherapy    She received 4 cycles of carbo/taxol       03/01/2015 PET scan    Single hypermetabolic small retroperitoneal lymph node along the aorta. 2. No evidence of metastatic disease otherwise in the abdomen or pelvis. No evidence local recurrence. 3. Intensely hypermetabolic enlarged nodule adjacent to the RIGHT lobe of thyroid gland. This presumably represents the biopsied lesion in clinician report which was found to be benign thyroid tissue.      04/05/2015 - 05/13/2015 Radiation Therapy    She received 50.4 gray in 28 fractions with simultaneous integrated boost to 56 gray      06/17/2015 Imaging    No acute process or evidence of metastatic disease in the abdomen or pelvis. Resolution of previously described retroperitoneal adenopathy. 2.  Possible constipation. 3. Atherosclerosis.      06/17/2015 Tumor Marker    Patient's tumor was tested for the following markers: CA125 Results of the tumor marker test revealed 33      08/12/2015 Tumor Marker    Patient's tumor was tested for the following markers: CA125 Results of the tumor marker test revealed 52      08/31/2015 PET scan    Development of right paratracheal hypermetabolic adenopathy, consistent with nodal metastasis. 2. The previously described isolated abdominal retroperitoneal hypermetabolic node has resolved. 3. Persistent hypermetabolic right thyroid nodule, per report previously  biopsied. Correlate with those results. 4.  Possible constipation.      09/09/2015 -  Anti-estrogen oral therapy    She has been receiving alternative  treatment between megace and tamoxifen      09/09/2015 Tumor Marker    Patient's tumor was tested for the following markers: CA125 Results of the tumor marker test revealed 37.8      09/20/2015 Procedure    Technically successful ultrasound-guided thyroid aspiration biopsy , dominant right nodule      09/20/2015 Pathology Results    THYROID, RIGHT, FINE NEEDLE ASPIRATION (SPECIMEN 1 OF 1 COLLECTED 09/20/15): FINDINGS CONSISTENT WITH BENIGN THYROID NODULE (BETHESDA CATEGORY II).      10/28/2015 Tumor Marker    Patient's tumor was tested for the following markers: CA125 Results of the tumor marker test revealed 53.2      11/04/2015 Imaging    Stable benign right thyroid nodule      12/16/2015 Tumor Marker    Patient's tumor was tested for the following markers: CA125 Results of the tumor marker test revealed 38.1      03/22/2016 PET scan    Interval progression of hypermetabolic right paratracheal lymph node consistent with metastatic involvement. 2. Stable hypermetabolic right thyroid nodule. Reportedly this has been biopsied in the past.      03/23/2016 Tumor Marker    Patient's tumor was tested for the following markers: CA125 Results of the tumor marker test revealed 62.4      04/13/2016 - 06/01/2016 Radiation Therapy    She received 56 Gy to the chest in 28 fractions       05/09/2016 Imaging    No evidence of left lower extremity deep vein thrombosis. No evidence of a superficial thrombosis of the greater and lesser saphenous veins. Positive for thrombus noted in several varicosities of the calf. No evidence of Baker's cyst on the left.      05/17/2016 Tumor Marker    Patient's tumor was tested for the following markers: CA125 Results of the tumor marker test revealed 9      06/29/2016 Tumor Marker    Patient's tumor was tested for the following markers: CA125 Results of the tumor marker test revealed 5.9      08/04/2016 Tumor Marker    Patient's tumor was tested for  the following markers: CA125 Results of the tumor marker test revealed 6.0      09/12/2016 PET scan    Complete metabolic response to therapy, with resolution of hypermetabolic mediastinal lymphadenopathy since prior exam. No residual or new metastatic disease identified. Stable hypermetabolic right thyroid lobe nodule, which was previously biopsied on 09/20/2015.      03/13/2017 PET scan    1. Solitary focus of recurrent right paratracheal hypermetabolic activity, with a 1.0 cm right lower paratracheal node having a maximum SUV of 9.0. Appearance compatible with recurrent malignancy. 2. Continued hypermetabolic right thyroid nodule, previously biopsied, presumed benign -correlate with prior biopsy results. 3. Other imaging findings of potential clinical significance: Aortic Atherosclerosis (ICD10-I70.0). Mild cardiomegaly. Prominent stool throughout the colon favors constipation.      04/02/2017 Pathology Results    FINE NEEDLE ASPIRATION, ENDOSCOPIC, (EBUS) 4 R NODE (SPECIMEN 1 OF 2 COLLECTED 04/02/17): MALIGNANT CELLS PRESENT, CONSISTENT WITH CARCINOMA. SEE COMMENT. COMMENT: THE MALIGNANT CELLS ARE POSITIVE FOR P53 AND NEGATIVE FOR ESTROGEN RECEPTOR AND TTF-1. THIS PROFILE IS NON-SPECIFIC, BUT THE P53 POSITIVE STAINING IS SUGGESTIVE OF GYNECOLOGIC PRIMARY.       04/11/2017 Procedure    Successful  8 French right internal jugular vein power port placement with its tip at the SVC/RA junction      04/12/2017 Imaging    Normal LV size with mild LV hypertrophy. EF 55-60%. Normal RV size and systolic function. Aortic valve sclerosis without significant stenosis.      04/18/2017 - 06/14/2017 Chemotherapy    The patient had chemotherapy with Doxil       07/10/2017 Imaging    - Left ventricle: The cavity size was normal. There was mild concentric hypertrophy. Systolic function was normal. The estimated ejection fraction was in the range of 55% to 60%. Wall motion was normal; there were no  regional wall motion abnormalities. Doppler parameters are consistent with abnormal left ventricular relaxation (grade 1 diastolic dysfunction). - Aortic valve: Noncoronary cusp mobility was mildly restricted. - Mitral valve: There was mild regurgitation. - Left atrium: The atrium was mildly dilated. - Atrial septum: No defect or patent foramen ovale was identified.  Impressions:  - Compared to November 2018, global LV longitudinal strain remains normal (has increased)      07/11/2017 PET scan    Increased size and hypermetabolic activity of 3.3 cm right thyroid lobe nodule. Thyroid carcinoma cannot be excluded. Recommend repeat ultrasound guided fine needle aspiration to exclude thyroid carcinoma.  New adjacent hypermetabolic 10 mm right supraclavicular lymph node, suspicious for lymph node metastasis.  Slight increase in size and hypermetabolic activity of solitary right paratracheal lymph node.  No evidence of abdominal or pelvic metastatic disease.      07/18/2017 Procedure    1. Technically successful ultrasound guided fine needle aspiration of indeterminate hypermetabolic right-sided thyroid nodule/mass. 2. Technically successful ultrasound-guided core needle biopsy of hypermetabolic right lower cervical lymph node.      07/18/2017 Pathology Results    THYROID, FINE NEEDLE ASPIRATION RIGHT (SPECIMEN 1 OF 1, COLLECTED ON 07/18/2017): ATYPIA OF UNDETERMINED SIGNIFICANCE OR FOLLICULAR LESION OF UNDETERMINED SIGNIFICANCE (BETHESDA CATEGORY III). SEE COMMENT. COMMENT: THE SPECIMEN CONSISTS OF SMALL AND MEDIUM SIZED GROUPS OF FOLLICULAR EPITHELIAL CELLS WITH MILD CYTOLOGIC ATYPIA INCLUDING NUCLEAR ENLARGEMENT AND HURTHLE CELL CHANGE. SOME GROUPS ARE ARRANGED AS MICROFOLLICLES. THERE IS MINIMAL BACKGROUND COLLOID. BASED ON THESE FEATURES, A FOLLICULAR LESION/NEOPLASM CAN NOT BE ENTIRELY RULED OUT. A SPECIMEN WILL BE SENT FOR AFIRMA TESTING.      07/19/2017 Pathology Results    Lymph node,  needle/core biopsy - METASTATIC PAPILLARY SEROUS CARCINOMA. - SEE COMMENT. Microscopic Comment Dr. Vicente Males has reviewed the case and concurs with this interpretation       REVIEW OF SYSTEMS:   Constitutional: Denies fevers, chills or abnormal weight loss Eyes: Denies blurriness of vision Ears, nose, mouth, throat, and face: Denies mucositis or sore throat Respiratory: Denies cough, dyspnea or wheezes Cardiovascular: Denies palpitation, chest discomfort or lower extremity swelling Gastrointestinal:  Denies nausea, heartburn or change in bowel habits Skin: Denies abnormal skin rashes Lymphatics: Denies new lymphadenopathy or easy bruising Neurological:Denies numbness, tingling or new weaknesses Behavioral/Psych: Mood is stable, no new changes  All other systems were reviewed with the patient and are negative.  I have reviewed the past medical history, past surgical history, social history and family history with the patient and they are unchanged from previous note.  ALLERGIES:  has No Known Allergies.  MEDICATIONS:  Current Outpatient Medications  Medication Sig Dispense Refill  . Calcium Carb-Cholecalciferol 500-600 MG-UNIT TABS Take 1 tablet by mouth daily.     . Cholecalciferol (VITAMIN D3) 2000 units TABS Take 2,000 Units by mouth daily.     Marland Kitchen  lidocaine-prilocaine (EMLA) cream Apply to affected area once (Patient taking differently: Apply 1 application topically daily as needed. Apply to affected area once) 30 g 3  . metoprolol tartrate (LOPRESSOR) 25 MG tablet Take 1 tablet (25 mg total) by mouth 2 (two) times daily. 60 tablet 3  . ondansetron (ZOFRAN) 8 MG tablet Take 1 tablet (8 mg total) by mouth 2 (two) times daily as needed (Nausea or vomiting). 30 tablet 1  . prochlorperazine (COMPAZINE) 10 MG tablet Take 1 tablet (10 mg total) by mouth every 6 (six) hours as needed (Nausea or vomiting). 30 tablet 1  . simvastatin (ZOCOR) 40 MG tablet Take 40 mg by mouth every  evening.    . tacrolimus (PROTOPIC) 0.1 % ointment Apply 1 application topically daily as needed (rash).     . venlafaxine XR (EFFEXOR-XR) 75 MG 24 hr capsule Take 75 mg by mouth daily with breakfast.      No current facility-administered medications for this visit.     PHYSICAL EXAMINATION: ECOG PERFORMANCE STATUS: 0 - Asymptomatic  Vitals:   07/19/17 1359  BP: (!) 153/86  Pulse: (!) 105  Resp: 18  Temp: 98.9 F (37.2 C)  SpO2: 100%   Filed Weights   07/19/17 1359  Weight: 165 lb 4.8 oz (75 kg)    GENERAL:alert, no distress and comfortable NEURO: alert & oriented x 3 with fluent speech, no focal motor/sensory deficits  LABORATORY DATA:  I have reviewed the data as listed    Component Value Date/Time   NA 141 06/14/2017 0852   NA 141 05/17/2017 0957   K 3.8 06/14/2017 0852   K 3.6 05/17/2017 0957   CL 108 06/14/2017 0852   CL 105 11/19/2012 0848   CO2 27 06/14/2017 0852   CO2 25 05/17/2017 0957   GLUCOSE 76 06/14/2017 0852   GLUCOSE 85 05/17/2017 0957   GLUCOSE 122 (H) 11/19/2012 0848   BUN 15 06/14/2017 0852   BUN 15.2 05/17/2017 0957   CREATININE 0.80 06/14/2017 0852   CREATININE 0.8 05/17/2017 0957   CALCIUM 9.7 06/14/2017 0852   CALCIUM 9.6 05/17/2017 0957   PROT 6.7 06/14/2017 0852   PROT 7.0 05/17/2017 0957   ALBUMIN 3.7 06/14/2017 0852   ALBUMIN 3.9 05/17/2017 0957   AST 24 06/14/2017 0852   AST 25 05/17/2017 0957   ALT 30 06/14/2017 0852   ALT 18 05/17/2017 0957   ALKPHOS 52 06/14/2017 0852   ALKPHOS 57 05/17/2017 0957   BILITOT 0.5 06/14/2017 0852   BILITOT 0.47 05/17/2017 0957   GFRNONAA >60 06/14/2017 0852   GFRAA >60 06/14/2017 0852    No results found for: SPEP, UPEP  Lab Results  Component Value Date   WBC 2.4 (L) 06/14/2017   NEUTROABS 1.4 (L) 06/14/2017   HGB 10.9 (L) 06/14/2017   HCT 31.4 (L) 06/14/2017   MCV 97.1 06/14/2017   PLT 202 06/14/2017      Chemistry      Component Value Date/Time   NA 141 06/14/2017 0852   NA  141 05/17/2017 0957   K 3.8 06/14/2017 0852   K 3.6 05/17/2017 0957   CL 108 06/14/2017 0852   CL 105 11/19/2012 0848   CO2 27 06/14/2017 0852   CO2 25 05/17/2017 0957   BUN 15 06/14/2017 0852   BUN 15.2 05/17/2017 0957   CREATININE 0.80 06/14/2017 0852   CREATININE 0.8 05/17/2017 0957   GLU 158 (H) 01/14/2015 1035      Component Value Date/Time  CALCIUM 9.7 06/14/2017 0852   CALCIUM 9.6 05/17/2017 0957   ALKPHOS 52 06/14/2017 0852   ALKPHOS 57 05/17/2017 0957   AST 24 06/14/2017 0852   AST 25 05/17/2017 0957   ALT 30 06/14/2017 0852   ALT 18 05/17/2017 0957   BILITOT 0.5 06/14/2017 0852   BILITOT 0.47 05/17/2017 0957       RADIOGRAPHIC STUDIES: I have personally reviewed the radiological images as listed and agreed with the findings in the report. Nm Pet Image Restag (ps) Skull Base To Thigh  Result Date: 07/11/2017 CLINICAL DATA:  Subsequent treatment strategy for endometrial carcinoma. EXAM: NUCLEAR MEDICINE PET SKULL BASE TO THIGH TECHNIQUE: 8.2 mCi F-18 FDG was injected intravenously. Full-ring PET imaging was performed from the skull base to thigh after the radiotracer. CT data was obtained and used for attenuation correction and anatomic localization. FASTING BLOOD GLUCOSE:  Value: 88 mg/dl COMPARISON:  03/13/2017 FINDINGS: NECK: A 3.3 cm right thyroid lobe nodule on image 52/4 has increased in size from 2.9 cm previously. Current SUV max measures 9.3, compared to 7.8 previously. Adjacent 10 mm right supraclavicular lymph node is seen on image 55/4, increased in size from 5 mm previously. This shows new hypermetabolic activity with SUV max of 5.7. No other hypermetabolic cervical lymph nodes identified. CHEST: A right paratracheal mediastinal lymph node measures 15 mm on image 66/for, compared to 12 mm previously. This has SUV max of 9.7, compared to 9.0 previously. No other hypermetabolic lymph nodes identified within the thorax. No suspicious pulmonary nodules seen on CT  images. Stable scarring in paramediastinal region of right upper lobe, suggesting previous radiation therapy. ABDOMEN/PELVIS: No abnormal hypermetabolic activity within the liver, pancreas, adrenal glands, or spleen. No hypermetabolic lymph nodes in the abdomen or pelvis. Prior hysterectomy again noted. SKELETON: No focal hypermetabolic bone lesions to suggest skeletal metastasis. IMPRESSION: Increased size and hypermetabolic activity of 3.3 cm right thyroid lobe nodule. Thyroid carcinoma cannot be excluded. Recommend repeat ultrasound guided fine needle aspiration to exclude thyroid carcinoma. New adjacent hypermetabolic 10 mm right supraclavicular lymph node, suspicious for lymph node metastasis. Slight increase in size and hypermetabolic activity of solitary right paratracheal lymph node. No evidence of abdominal or pelvic metastatic disease. Electronically Signed   By: Earle Gell M.D.   On: 07/11/2017 11:17   [place] Core Biopsy (thyroid)  Result Date: 07/18/2017 INDICATION: History of endometrial cancer now with hypermetabolic right-sided thyroid nodule as well as hypermetabolic right lower cervical lymph node. Note, the hypermetabolic thyroid nodule has been biopsied on 09/20/2015 with benign pathologic diagnosis however given interval enlargement and persistent hypermetabolic activity, request has been made for repeat ultrasound-guided fine-needle aspiration. EXAM: 1. ULTRASOUND GUIDED FINE NEEDLE ASPIRATION OF INDETERMINATE THYROID NODULE 2. ULTRASOUND-GUIDED RIGHT CERVICAL LYMPH NODE BIOPSY COMPARISON:  PET-CT - 07/11/2016; ultrasound-guided right-sided thyroid nodule fine-needle aspiration - 09/20/2015 MEDICATIONS: None COMPLICATIONS: None immediate. TECHNIQUE: Informed written consent was obtained from the patient after a discussion of the risks, benefits and alternatives to treatment. Questions regarding the procedure were encouraged and answered. A timeout was performed prior to the initiation of the  procedure. Pre-procedural ultrasound scanning demonstrated grossly unchanged appearance of known hypermetabolic right-sided thyroid mass as well as an approximately 1.2 x 0.8 cm hypoechoic nodule (image 11) correlating with the hypermetabolic nodule seen on preceding PET-CT. Attention was first paid towards ultrasound-guided fine-needle aspiration of the hypermetabolic right-sided thyroid nodule/mass. The neck was prepped in the usual sterile fashion, and a sterile drape was applied covering the operative field.  A timeout was performed prior to the initiation of the procedure. Local anesthesia was provided with 1% lidocaine. Under direct ultrasound guidance, 4 FNA biopsies were performed of the 5 with a 27 gauge needle. The initial 3 samples were sent for cytologic analysis with the final 2 samples were set aside for potential Afirma testing. The samples were prepared and submitted to pathology. Limited post procedural scanning was negative for hematoma or additional complication. Attention was now paid towards the hypermetabolic right cervical lymph node biopsy. After the overlying soft tissues were anesthetized with 1% lidocaine with epinephrine, an 18 gauge core needle biopsy device was utilized to acquire 3 core needle biopsies of the hypermetabolic right cervical lymph node. Multiple ultrasound images were saved for procedural documentation purposes Postprocedural scanning was negative for hematoma or additional complication. Dressings were placed. The patient tolerated the above procedures procedure well without immediate postprocedural complication. IMPRESSION: 1. Technically successful ultrasound guided fine needle aspiration of indeterminate hypermetabolic right-sided thyroid nodule/mass. 2. Technically successful ultrasound-guided core needle biopsy of hypermetabolic right lower cervical lymph node. Electronically Signed   By: Sandi Mariscal M.D.   On: 07/18/2017 15:12   [place] Core Biopsy (lymph  Nodes)  Result Date: 07/18/2017 INDICATION: History of endometrial cancer now with hypermetabolic right-sided thyroid nodule as well as hypermetabolic right lower cervical lymph node. Note, the hypermetabolic thyroid nodule has been biopsied on 09/20/2015 with benign pathologic diagnosis however given interval enlargement and persistent hypermetabolic activity, request has been made for repeat ultrasound-guided fine-needle aspiration. EXAM: 1. ULTRASOUND GUIDED FINE NEEDLE ASPIRATION OF INDETERMINATE THYROID NODULE 2. ULTRASOUND-GUIDED RIGHT CERVICAL LYMPH NODE BIOPSY COMPARISON:  PET-CT - 07/11/2016; ultrasound-guided right-sided thyroid nodule fine-needle aspiration - 09/20/2015 MEDICATIONS: None COMPLICATIONS: None immediate. TECHNIQUE: Informed written consent was obtained from the patient after a discussion of the risks, benefits and alternatives to treatment. Questions regarding the procedure were encouraged and answered. A timeout was performed prior to the initiation of the procedure. Pre-procedural ultrasound scanning demonstrated grossly unchanged appearance of known hypermetabolic right-sided thyroid mass as well as an approximately 1.2 x 0.8 cm hypoechoic nodule (image 11) correlating with the hypermetabolic nodule seen on preceding PET-CT. Attention was first paid towards ultrasound-guided fine-needle aspiration of the hypermetabolic right-sided thyroid nodule/mass. The neck was prepped in the usual sterile fashion, and a sterile drape was applied covering the operative field. A timeout was performed prior to the initiation of the procedure. Local anesthesia was provided with 1% lidocaine. Under direct ultrasound guidance, 4 FNA biopsies were performed of the 5 with a 27 gauge needle. The initial 3 samples were sent for cytologic analysis with the final 2 samples were set aside for potential Afirma testing. The samples were prepared and submitted to pathology. Limited post procedural scanning was  negative for hematoma or additional complication. Attention was now paid towards the hypermetabolic right cervical lymph node biopsy. After the overlying soft tissues were anesthetized with 1% lidocaine with epinephrine, an 18 gauge core needle biopsy device was utilized to acquire 3 core needle biopsies of the hypermetabolic right cervical lymph node. Multiple ultrasound images were saved for procedural documentation purposes Postprocedural scanning was negative for hematoma or additional complication. Dressings were placed. The patient tolerated the above procedures procedure well without immediate postprocedural complication. IMPRESSION: 1. Technically successful ultrasound guided fine needle aspiration of indeterminate hypermetabolic right-sided thyroid nodule/mass. 2. Technically successful ultrasound-guided core needle biopsy of hypermetabolic right lower cervical lymph node. Electronically Signed   By: Sandi Mariscal M.D.   On: 07/18/2017  15:12    All questions were answered. The patient knows to call the clinic with any problems, questions or concerns. No barriers to learning was detected.  I spent 25 minutes counseling the patient face to face. The total time spent in the appointment was 30 minutes and more than 50% was on counseling and review of test results  [name], MD 07/19/2017 2:52 PM

## 2017-07-19 NOTE — Assessment & Plan Note (Signed)
Unfortunately, recent biopsy confirmed cancer progression. We discussed options and reviewed current NCCN guidelines. Options discussed included enrollment in clinical trial at [facility], single agent Avastin, repeat treatment with carboplatin/taxol or single agents carboplatin or taxol She is undecided. I recommend regroup again next week once we have final pathology from thyroid biopsy In the mean time, she is not symptomatic

## 2017-07-21 DIAGNOSIS — E041 Nontoxic single thyroid nodule: Secondary | ICD-10-CM | POA: Diagnosis not present

## 2017-07-23 ENCOUNTER — Ambulatory Visit: Payer: 59 | Admitting: Hematology and Oncology

## 2017-07-25 ENCOUNTER — Telehealth: Payer: Self-pay | Admitting: *Deleted

## 2017-07-25 NOTE — Telephone Encounter (Signed)
-----   Message from [name], MD sent at 07/25/2017  8:09 AM EST ----- Regarding: decision Please tell Diane Lozano:  1) Final thyroid test will not be back till next week 2) I have talked with Dr. Denman George. We have come to the conclusion of either clinical trial at [facility] or Avastin only if she decides against clinical trial. Clinical trial would be better because she is healthy and have no problems now. Will reserve plan for chemo in the future.  3) I will call her once I have the final thyroid test next week. 4) I suggest she makes plan to go back to [facility] in the mean time (she should have their number to call)

## 2017-07-25 NOTE — Telephone Encounter (Signed)
Notified of message below.   States she will think about it and give [place] a call back. If she decides to go with Avastin, when would it start?

## 2017-07-26 ENCOUNTER — Telehealth: Payer: Self-pay

## 2017-07-26 NOTE — Telephone Encounter (Signed)
Patient called and left message to call her.  She is requesting that Dr. Alvy Bimler talk with PCP who is a close friend about options. Offered 3/8 appt to see Dr. Alvy Bimler at [place], declined at this time. She is going to call back Monday with her decision. She will call her GYN and look at the different clinical trials.

## 2017-07-30 ENCOUNTER — Other Ambulatory Visit: Payer: Self-pay | Admitting: Hematology and Oncology

## 2017-07-30 ENCOUNTER — Telehealth: Payer: Self-pay | Admitting: *Deleted

## 2017-07-30 DIAGNOSIS — C770 Secondary and unspecified malignant neoplasm of lymph nodes of head, face and neck: Secondary | ICD-10-CM

## 2017-07-30 DIAGNOSIS — Z7189 Other specified counseling: Secondary | ICD-10-CM

## 2017-07-30 DIAGNOSIS — C541 Malignant neoplasm of endometrium: Secondary | ICD-10-CM

## 2017-07-30 NOTE — Progress Notes (Signed)
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

## 2017-07-30 NOTE — Telephone Encounter (Signed)
Diane Lozano states she wants to proceed with Avastin. Does NOT need to meet with Dr Alvy Bimler before starting.

## 2017-08-02 ENCOUNTER — Telehealth: Payer: Self-pay | Admitting: Hematology and Oncology

## 2017-08-02 ENCOUNTER — Other Ambulatory Visit: Payer: Self-pay | Admitting: Hematology and Oncology

## 2017-08-02 DIAGNOSIS — C541 Malignant neoplasm of endometrium: Secondary | ICD-10-CM

## 2017-08-02 NOTE — Telephone Encounter (Signed)
I have reviewed final pathology for her thyroid biopsy. Final confirmation test still reveals suspicion for thyroid cancer although additional next generation sequencing tests came back negative I reviewed the test with the patient over the telephone We agreed to not pursue further evaluation for thyroid cancer and to focus on her disease process with treatment for her metastatic endometrial cancer I discussed with her the risks, benefits, side effects of Avastin including risk of bleeding, blood clot, GI perforation, hypertension, etc. and she agreed to proceed We will see her back prior to cycle 2 of treatment.

## 2017-08-03 ENCOUNTER — Encounter (HOSPITAL_COMMUNITY): Payer: Self-pay

## 2017-08-03 ENCOUNTER — Telehealth: Payer: Self-pay | Admitting: Hematology and Oncology

## 2017-08-03 NOTE — Telephone Encounter (Signed)
Spoke with patient and confirmed appt per 2/28 sch msg

## 2017-08-06 ENCOUNTER — Inpatient Hospital Stay: Payer: 59

## 2017-08-06 ENCOUNTER — Inpatient Hospital Stay: Payer: 59 | Attending: Hematology and Oncology

## 2017-08-06 ENCOUNTER — Other Ambulatory Visit: Payer: Self-pay | Admitting: Hematology and Oncology

## 2017-08-06 VITALS — BP 161/79 | HR 71 | Temp 98.1°F | Resp 17 | Ht 63.0 in | Wt 166.2 lb

## 2017-08-06 DIAGNOSIS — D701 Agranulocytosis secondary to cancer chemotherapy: Secondary | ICD-10-CM | POA: Insufficient documentation

## 2017-08-06 DIAGNOSIS — C770 Secondary and unspecified malignant neoplasm of lymph nodes of head, face and neck: Secondary | ICD-10-CM | POA: Diagnosis not present

## 2017-08-06 DIAGNOSIS — Z9221 Personal history of antineoplastic chemotherapy: Secondary | ICD-10-CM | POA: Diagnosis not present

## 2017-08-06 DIAGNOSIS — C541 Malignant neoplasm of endometrium: Secondary | ICD-10-CM

## 2017-08-06 DIAGNOSIS — I10 Essential (primary) hypertension: Secondary | ICD-10-CM | POA: Diagnosis not present

## 2017-08-06 DIAGNOSIS — Z923 Personal history of irradiation: Secondary | ICD-10-CM | POA: Diagnosis not present

## 2017-08-06 DIAGNOSIS — T451X5S Adverse effect of antineoplastic and immunosuppressive drugs, sequela: Secondary | ICD-10-CM | POA: Insufficient documentation

## 2017-08-06 DIAGNOSIS — Z5112 Encounter for antineoplastic immunotherapy: Secondary | ICD-10-CM | POA: Diagnosis not present

## 2017-08-06 DIAGNOSIS — Z7189 Other specified counseling: Secondary | ICD-10-CM

## 2017-08-06 DIAGNOSIS — Z79899 Other long term (current) drug therapy: Secondary | ICD-10-CM | POA: Diagnosis not present

## 2017-08-06 DIAGNOSIS — Z95828 Presence of other vascular implants and grafts: Secondary | ICD-10-CM

## 2017-08-06 LAB — COMPREHENSIVE METABOLIC PANEL
ALT: 19 U/L (ref 0–55)
AST: 20 U/L (ref 5–34)
Albumin: 3.8 g/dL (ref 3.5–5.0)
Alkaline Phosphatase: 58 U/L (ref 40–150)
Anion gap: 9 (ref 3–11)
BUN: 14 mg/dL (ref 7–26)
CO2: 25 mmol/L (ref 22–29)
Calcium: 9.9 mg/dL (ref 8.4–10.4)
Chloride: 107 mmol/L (ref 98–109)
Creatinine, Ser: 0.76 mg/dL (ref 0.60–1.10)
GFR calc Af Amer: >60 mL/min (ref >60)
GFR calc non Af Amer: >60 mL/min (ref >60)
Glucose, Bld: 102 mg/dL (ref 70–140)
Potassium: 3.7 mmol/L (ref 3.5–5.1)
Sodium: 141 mmol/L (ref 136–145)
Total Bilirubin: 0.5 mg/dL (ref 0.2–1.2)
Total Protein: 6.8 g/dL (ref 6.4–8.3)

## 2017-08-06 LAB — CBC WITH DIFFERENTIAL (CANCER CENTER ONLY)
Basophils Absolute: 0 10*3/uL (ref 0.0–0.1)
Basophils Relative: 1 %
Eosinophils Absolute: 0.2 10*3/uL (ref 0.0–0.5)
Eosinophils Relative: 7 %
HCT: 34.2 % — ABNORMAL LOW (ref 34.8–46.6)
Hemoglobin: 11.3 g/dL — ABNORMAL LOW (ref 11.6–15.9)
Lymphocytes Relative: 22 %
Lymphs Abs: 0.6 10*3/uL — ABNORMAL LOW (ref 0.9–3.3)
MCH: 32.9 pg (ref 25.1–34.0)
MCHC: 33 g/dL (ref 31.5–36.0)
MCV: 99.7 fL (ref 79.5–101.0)
Monocytes Absolute: 0.2 10*3/uL (ref 0.1–0.9)
Monocytes Relative: 6 %
Neutro Abs: 1.8 10*3/uL (ref 1.5–6.5)
Neutrophils Relative %: 64 %
Platelet Count: 152 10*3/uL (ref 145–400)
RBC: 3.43 MIL/uL — ABNORMAL LOW (ref 3.70–5.45)
RDW: 12.8 % (ref 11.2–14.5)
WBC Count: 2.8 10*3/uL — ABNORMAL LOW (ref 3.9–10.3)

## 2017-08-06 LAB — TOTAL PROTEIN, URINE DIPSTICK: Protein, ur: NEGATIVE mg/dL

## 2017-08-06 MED ORDER — SODIUM CHLORIDE 0.9 % IV SOLN
Freq: Once | INTRAVENOUS | Status: AC
  Administered 2017-08-06: 10:00:00 via INTRAVENOUS

## 2017-08-06 MED ORDER — SODIUM CHLORIDE 0.9% FLUSH
10.0000 mL | INTRAVENOUS | Status: DC | PRN
  Administered 2017-08-06: 10 mL
  Filled 2017-08-06: qty 10

## 2017-08-06 MED ORDER — SODIUM CHLORIDE 0.9 % IV SOLN
14.8000 mg/kg | Freq: Once | INTRAVENOUS | Status: AC
  Administered 2017-08-06: 1100 mg via INTRAVENOUS
  Filled 2017-08-06: qty 44

## 2017-08-06 MED ORDER — HEPARIN SOD (PORK) LOCK FLUSH 100 UNIT/ML IV SOLN
500.0000 [IU] | Freq: Once | INTRAVENOUS | Status: AC | PRN
  Administered 2017-08-06: 500 [IU]
  Filled 2017-08-06: qty 5

## 2017-08-06 MED ORDER — SODIUM CHLORIDE 0.9% FLUSH
10.0000 mL | Freq: Once | INTRAVENOUS | Status: AC
  Administered 2017-08-06: 10 mL
  Filled 2017-08-06: qty 10

## 2017-08-06 NOTE — Patient Instructions (Signed)

## 2017-08-06 NOTE — Patient Instructions (Addendum)
[facility] Discharge Instructions for Patients Receiving Chemotherapy  Today you received the following chemotherapy agents: Bevacizumab (Avastin).  To help prevent nausea and vomiting after your treatment, we encourage you to take your nausea medication as prescribed. If you develop nausea and vomiting that is not controlled by your nausea medication, call the clinic.   BELOW ARE SYMPTOMS THAT SHOULD BE REPORTED IMMEDIATELY:  *FEVER GREATER THAN 100.5 F  *CHILLS WITH OR WITHOUT FEVER  NAUSEA AND VOMITING THAT IS NOT CONTROLLED WITH YOUR NAUSEA MEDICATION  *UNUSUAL SHORTNESS OF BREATH  *UNUSUAL BRUISING OR BLEEDING  TENDERNESS IN MOUTH AND THROAT WITH OR WITHOUT PRESENCE OF ULCERS  *URINARY PROBLEMS  *BOWEL PROBLEMS  UNUSUAL RASH Items with * indicate a potential emergency and should be followed up as soon as possible.  Feel free to call the clinic should you have any questions or concerns. The clinic phone number is [phone].  Please show the [facility] at check-in to the Emergency Department and triage nurse.  Bevacizumab injection What is this medicine? BEVACIZUMAB (be va SIZ yoo mab) is a monoclonal antibody. It is used to treat many types of cancer. This medicine may be used for other purposes; ask your health care provider or pharmacist if you have questions. COMMON BRAND NAME(S): Avastin What should I tell my health care provider before I take this medicine? They need to know if you have any of these conditions: -diabetes -heart disease -high blood pressure -history of coughing up blood -prior anthracycline chemotherapy (e.g., doxorubicin, daunorubicin, epirubicin) -recent or ongoing radiation therapy -recent or planning to have surgery -stroke -an unusual or allergic reaction to bevacizumab, hamster proteins, mouse proteins, other medicines, foods, dyes, or preservatives -pregnant or trying to get pregnant -breast-feeding How  should I use this medicine? This medicine is for infusion into a vein. It is given by a health care professional in a hospital or clinic setting. Talk to your pediatrician regarding the use of this medicine in children. Special care may be needed. Overdosage: If you think you have taken too much of this medicine contact a poison control center or emergency room at once. NOTE: This medicine is only for you. Do not share this medicine with others. What if I miss a dose? It is important not to miss your dose. Call your doctor or health care professional if you are unable to keep an appointment. What may interact with this medicine? Interactions are not expected. This list may not describe all possible interactions. Give your health care provider a list of all the medicines, herbs, non-prescription drugs, or dietary supplements you use. Also tell them if you smoke, drink alcohol, or use illegal drugs. Some items may interact with your medicine. What should I watch for while using this medicine? Your condition will be monitored carefully while you are receiving this medicine. You will need important blood work and urine testing done while you are taking this medicine. This medicine may increase your risk to bruise or bleed. Call your doctor or health care professional if you notice any unusual bleeding. This medicine should be started at least 28 days following major surgery and the site of the surgery should be totally healed. Check with your doctor before scheduling dental work or surgery while you are receiving this treatment. Talk to your doctor if you have recently had surgery or if you have a wound that has not healed. Do not become pregnant while taking this medicine or for 6 months after  stopping it. Women should inform their doctor if they wish to become pregnant or think they might be pregnant. There is a potential for serious side effects to an unborn child. Talk to your health care professional  or pharmacist for more information. Do not breast-feed an infant while taking this medicine and for 6 months after the last dose. This medicine has caused ovarian failure in some women. This medicine may interfere with the ability to have a child. You should talk to your doctor or health care professional if you are concerned about your fertility. What side effects may I notice from receiving this medicine? Side effects that you should report to your doctor or health care professional as soon as possible: -allergic reactions like skin rash, itching or hives, swelling of the face, lips, or tongue -chest pain or chest tightness -chills -coughing up blood -high fever -seizures -severe constipation -signs and symptoms of bleeding such as bloody or black, tarry stools; red or dark-brown urine; spitting up blood or brown material that looks like coffee grounds; red spots on the skin; unusual bruising or bleeding from the eye, gums, or nose -signs and symptoms of a blood clot such as breathing problems; chest pain; severe, sudden headache; pain, swelling, warmth in the leg -signs and symptoms of a stroke like changes in vision; confusion; trouble speaking or understanding; severe headaches; sudden numbness or weakness of the face, arm or leg; trouble walking; dizziness; loss of balance or coordination -stomach pain -sweating -swelling of legs or ankles -vomiting -weight gain Side effects that usually do not require medical attention (report to your doctor or health care professional if they continue or are bothersome): -back pain -changes in taste -decreased appetite -dry skin -nausea -tiredness This list may not describe all possible side effects. Call your doctor for medical advice about side effects. You may report side effects to FDA at 1-800-FDA-1088. Where should I keep my medicine? This drug is given in a hospital or clinic and will not be stored at home. NOTE: This sheet is a summary.  It may not cover all possible information. If you have questions about this medicine, talk to your doctor, pharmacist, or health care provider.  2018 Elsevier/Gold Standard (2016-05-19 14:33:29)

## 2017-08-07 ENCOUNTER — Telehealth: Payer: Self-pay | Admitting: *Deleted

## 2017-08-07 NOTE — Telephone Encounter (Signed)
No call

## 2017-08-07 NOTE — Telephone Encounter (Signed)
-----   Message from Zola Button, RN sent at 08/06/2017 12:33 PM EST ----- Regarding: Dr. Alvy Bimler; First time F/U Call Pt received first time Avastin. Tolerated well. Thank you!

## 2017-08-07 NOTE — Telephone Encounter (Signed)
Attempted call to patient after 1st time Avastin. No answer, no vm available.

## 2017-08-09 MED FILL — TACROLIMUS 0.1 % OINT: 0.1 | 20 days supply | Qty: 30 | Fill #1

## 2017-08-10 ENCOUNTER — Telehealth: Payer: 59 | Admitting: Family

## 2017-08-10 DIAGNOSIS — Z719 Counseling, unspecified: Secondary | ICD-10-CM

## 2017-08-10 NOTE — Progress Notes (Signed)
Thank you for the details you included in the comment boxes. Those details are very helpful in determining the best course of treatment for you and help [place] to provide the best care. This is beyond the level of what the E-visit program can safely treat.  Based on what you shared with me it looks like you have a serious condition that should be evaluated in a face to face office visit.  NOTE: If you entered your credit card information for this eVisit, you will not be charged. You may see a "hold" on your card for the $30 but that hold will drop off and you will not have a charge processed.  If you are having a true medical emergency please call 911.  If you need an urgent face to face visit, [facility] has four urgent care centers for your convenience.  If you need care fast and have a high deductible or no insurance consider:   [email or web address] to reserve your spot online an avoid wait times  [facility] [address] [place], [place] [address] 8 am to 8 pm Monday-Friday 10 am to 4 pm Saturday-Sunday *Across the street from [employer]  [place], [address] 8 am to 5 pm Monday-Friday * In the [facility] on the [facility]   The following sites will take your  insurance:  . [facility] [place] a Provider at this Location  [address] [place], [place] [address] . 10 am to 8 pm Monday-Friday . 12 pm to 8 pm Saturday-Sunday   . [facility] at [place] a Provider at this Location  [address], [place], [place] [address] . 8 am to 8 pm Monday-Friday . 9 am to 6 pm Saturday . 11 am to 6 pm Sunday   . [facility] at [place] Get Driving Directions  [address] Suite [place], [place] [address] . 8 am to 8 pm  Monday-Friday . 8 am to 4 pm Saturday-Sunday   Your e-visit answers were reviewed by a board certified advanced clinical practitioner to complete your personal care plan.  Thank you for using e-Visits.

## 2017-08-16 ENCOUNTER — Telehealth: Payer: Self-pay

## 2017-08-16 NOTE — Telephone Encounter (Signed)
Patient called and left message. She has a head cold, with some chest congestion. Denies fever. Her question is,  should she be extra cautious and work from home until she has recovered from her cold.  Called back and left message. Told her it would be a good idea to work from home if she is able. To take precautions like you would normally would in the cold and flu season. Instructed to call office if needed.

## 2017-08-22 MED FILL — METOPROLOL TARTRATE 25 MG T: 25 | 30 days supply | Qty: 60 | Fill #2

## 2017-08-22 MED FILL — SIMVASTATIN 40 MG TABLET: 40 | 90 days supply | Qty: 90 | Fill #1

## 2017-08-27 ENCOUNTER — Inpatient Hospital Stay: Payer: 59

## 2017-08-27 ENCOUNTER — Inpatient Hospital Stay (HOSPITAL_BASED_OUTPATIENT_CLINIC_OR_DEPARTMENT_OTHER): Payer: 59 | Admitting: Hematology and Oncology

## 2017-08-27 ENCOUNTER — Encounter: Payer: Self-pay | Admitting: Hematology and Oncology

## 2017-08-27 DIAGNOSIS — C541 Malignant neoplasm of endometrium: Secondary | ICD-10-CM | POA: Diagnosis not present

## 2017-08-27 DIAGNOSIS — C770 Secondary and unspecified malignant neoplasm of lymph nodes of head, face and neck: Secondary | ICD-10-CM | POA: Diagnosis not present

## 2017-08-27 DIAGNOSIS — Z7189 Other specified counseling: Secondary | ICD-10-CM

## 2017-08-27 DIAGNOSIS — T451X5A Adverse effect of antineoplastic and immunosuppressive drugs, initial encounter: Secondary | ICD-10-CM | POA: Diagnosis not present

## 2017-08-27 DIAGNOSIS — Z79899 Other long term (current) drug therapy: Secondary | ICD-10-CM

## 2017-08-27 DIAGNOSIS — Z9221 Personal history of antineoplastic chemotherapy: Secondary | ICD-10-CM | POA: Diagnosis not present

## 2017-08-27 DIAGNOSIS — Z5112 Encounter for antineoplastic immunotherapy: Secondary | ICD-10-CM | POA: Diagnosis not present

## 2017-08-27 DIAGNOSIS — I10 Essential (primary) hypertension: Secondary | ICD-10-CM | POA: Diagnosis not present

## 2017-08-27 DIAGNOSIS — D701 Agranulocytosis secondary to cancer chemotherapy: Secondary | ICD-10-CM | POA: Diagnosis not present

## 2017-08-27 DIAGNOSIS — Z95828 Presence of other vascular implants and grafts: Secondary | ICD-10-CM

## 2017-08-27 DIAGNOSIS — T451X5S Adverse effect of antineoplastic and immunosuppressive drugs, sequela: Secondary | ICD-10-CM | POA: Diagnosis not present

## 2017-08-27 DIAGNOSIS — Z923 Personal history of irradiation: Secondary | ICD-10-CM | POA: Diagnosis not present

## 2017-08-27 LAB — COMPREHENSIVE METABOLIC PANEL
ALT: 17 U/L (ref 0–55)
AST: 20 U/L (ref 5–34)
Albumin: 3.8 g/dL (ref 3.5–5.0)
Alkaline Phosphatase: 66 U/L (ref 40–150)
Anion gap: 7 (ref 3–11)
BUN: 18 mg/dL (ref 7–26)
CO2: 28 mmol/L (ref 22–29)
Calcium: 10 mg/dL (ref 8.4–10.4)
Chloride: 106 mmol/L (ref 98–109)
Creatinine, Ser: 0.83 mg/dL (ref 0.60–1.10)
GFR calc Af Amer: >60 mL/min (ref >60)
GFR calc non Af Amer: >60 mL/min (ref >60)
Glucose, Bld: 85 mg/dL (ref 70–140)
Potassium: 4.1 mmol/L (ref 3.5–5.1)
Sodium: 141 mmol/L (ref 136–145)
Total Bilirubin: 0.4 mg/dL (ref 0.2–1.2)
Total Protein: 7.5 g/dL (ref 6.4–8.3)

## 2017-08-27 LAB — CBC WITH DIFFERENTIAL (CANCER CENTER ONLY)
Basophils Absolute: 0 10*3/uL (ref 0.0–0.1)
Basophils Relative: 1 %
Eosinophils Absolute: 0.1 10*3/uL (ref 0.0–0.5)
Eosinophils Relative: 4 %
HCT: 35.7 % (ref 34.8–46.6)
Hemoglobin: 11.9 g/dL (ref 11.6–15.9)
Lymphocytes Relative: 17 %
Lymphs Abs: 0.6 10*3/uL — ABNORMAL LOW (ref 0.9–3.3)
MCH: 32.5 pg (ref 25.1–34.0)
MCHC: 33.3 g/dL (ref 31.5–36.0)
MCV: 97.5 fL (ref 79.5–101.0)
Monocytes Absolute: 0.3 10*3/uL (ref 0.1–0.9)
Monocytes Relative: 7 %
Neutro Abs: 2.7 10*3/uL (ref 1.5–6.5)
Neutrophils Relative %: 71 %
Platelet Count: 189 10*3/uL (ref 145–400)
RBC: 3.66 MIL/uL — ABNORMAL LOW (ref 3.70–5.45)
RDW: 12.2 % (ref 11.2–14.5)
WBC Count: 3.7 10*3/uL — ABNORMAL LOW (ref 3.9–10.3)

## 2017-08-27 LAB — TOTAL PROTEIN, URINE DIPSTICK: Protein, ur: NEGATIVE mg/dL

## 2017-08-27 MED ORDER — SODIUM CHLORIDE 0.9 % IV SOLN
Freq: Once | INTRAVENOUS | Status: AC
  Administered 2017-08-27: 14:00:00 via INTRAVENOUS

## 2017-08-27 MED ORDER — HEPARIN SOD (PORK) LOCK FLUSH 100 UNIT/ML IV SOLN
250.0000 [IU] | Freq: Once | INTRAVENOUS | Status: AC
  Administered 2017-08-27: 250 [IU]
  Filled 2017-08-27: qty 5

## 2017-08-27 MED ORDER — BEVACIZUMAB CHEMO INJECTION 400 MG/16ML
14.7000 mg/kg | Freq: Once | INTRAVENOUS | Status: AC
  Administered 2017-08-27: 1100 mg via INTRAVENOUS
  Filled 2017-08-27: qty 44

## 2017-08-27 MED ORDER — SODIUM CHLORIDE 0.9% FLUSH
10.0000 mL | Freq: Once | INTRAVENOUS | Status: AC
  Administered 2017-08-27: 10 mL
  Filled 2017-08-27: qty 10

## 2017-08-27 NOTE — Patient Instructions (Signed)
[facility] Discharge Instructions for Patients Receiving Chemotherapy  Today you received the following chemotherapy agents Avastin.   To help prevent nausea and vomiting after your treatment, we encourage you to take your nausea medication as prescribed.    If you develop nausea and vomiting that is not controlled by your nausea medication, call the clinic.   BELOW ARE SYMPTOMS THAT SHOULD BE REPORTED IMMEDIATELY:  *FEVER GREATER THAN 100.5 F  *CHILLS WITH OR WITHOUT FEVER  NAUSEA AND VOMITING THAT IS NOT CONTROLLED WITH YOUR NAUSEA MEDICATION  *UNUSUAL SHORTNESS OF BREATH  *UNUSUAL BRUISING OR BLEEDING  TENDERNESS IN MOUTH AND THROAT WITH OR WITHOUT PRESENCE OF ULCERS  *URINARY PROBLEMS  *BOWEL PROBLEMS  UNUSUAL RASH Items with * indicate a potential emergency and should be followed up as soon as possible.  Feel free to call the clinic should you have any questions or concerns. The clinic phone number is [phone].  Please show the CHEMO ALERT CARD at check-in to the Emergency Department and triage nurse.   

## 2017-08-28 ENCOUNTER — Encounter: Payer: Self-pay | Admitting: Hematology and Oncology

## 2017-08-28 NOTE — Assessment & Plan Note (Signed)
She has mild leukopenia She is not symptomatic Observe for now

## 2017-08-28 NOTE — Assessment & Plan Note (Signed)
Examination is benign.  I do not perceive any abnormal growth in the lymph nodes around her neck.

## 2017-08-28 NOTE — Assessment & Plan Note (Signed)
Her blood pressure is persistently elevated. We discussed the importance of close monitoring of blood pressure.  I would reassess her blood pressure control in her next visit. In the meantime, she will continue metoprolol

## 2017-08-28 NOTE — Progress Notes (Signed)
[place] OFFICE PROGRESS NOTE  Patient Care Team: [name], MD as PCP - General (Family Medicine) Reynold Bowen, MD as Consulting Physician (Endocrinology)  ASSESSMENT & PLAN:  Endometrial cancer [facility]) She tolerated single agent Avastin well without any side effects I did notice that her blood pressure is mildly elevated I recommend blood pressure monitoring twice a day. I have instructed the patient to call me if her blood pressure is persistently elevated with systolic blood pressure greater than 916 and diastolic blood pressure greater than 90 I plan minimum 3-4 doses of treatment before repeat imaging study.  She agreed to proceed  Metastasis to supraclavicular lymph node [facility]) Examination is benign.  I do not perceive any abnormal growth in the lymph nodes around her neck.  Essential hypertension Her blood pressure is persistently elevated. We discussed the importance of close monitoring of blood pressure.  I would reassess her blood pressure control in her next visit. In the meantime, she will continue metoprolol  Leukopenia due to antineoplastic chemotherapy [facility]) She has mild leukopenia She is not symptomatic Observe for now   No orders of the defined types were placed in this encounter.   INTERVAL HISTORY: Please see below for problem oriented charting. She returns for further follow-up She denies any side effects of treatment No headaches, blurriness of vision or abnormal bleeding Her appetite is stable, no recent weight loss  SUMMARY OF ONCOLOGIC HISTORY: Oncology History   [employer] testing done 11-2015 on surgical path from 2014: MS stable TMB low 4 muts/mb ATM [id number] ERBB3 T389K E2H2 rearrangement exon 9 PPP2R1A P179R TP53 I195N    ER- APPROXIMATELY 25-35% STAINING IN NEOPLASTIC CELLS (INTERMEDIATE)  PR- APPROXIMATELY 25-35% STAINING IN NEOPLASTIC CELLS (STRONG)  Repeat biopsy 04/02/17: ER negative, Her 2  negative     Endometrial cancer ([place])   06/20/2012 Pathology Results    Biopsy positive for papillary serous carcinoma      06/20/2012 Genetic Testing    [employer] testing done 11-2015 on surgical path from 2014: MS stable TMB low 4 muts/mb ATM [id number] ERBB3 T389K E2H2 rearrangement exon 9 PPP2R1A P179R TP53 I195N       06/20/2012 Initial Diagnosis    Patient presented to PCP with intermittent vaginal bleeding since ~ Oct 2013, endometrial biopsy 06-20-12 with complex endometrial hyperplasia with atypia      06/26/2012 Imaging    Thickened endometrial lining in a postmenopausal patient experiencing vaginal bleeding. In the setting of post-menopausal bleeding, endometrial sampling is indicated to exclude carcinoma. No focal myometrial abnormalities are seen.  Normal left ovary and non-visualized right ovary      07/30/2012 Surgery    Dr. Polly Cobia performed robotic hysterectomy with bilateral salpingo-oophorectomy, bilateral pelvic lymph node dissection and periaortic lymph node dissection. Intraoperatively on frozen section, the patient was noted to have a large endometrial polyp with changes within the polyp consistent for high-grade malignancy, possibly papillary serous carcinoma. There is no obvious extrauterine disease noted.        07/30/2012 Pathology Results    [phone] SUPPLEMENTAL REPORT  THE ENDOMETRIAL CARCINOMA WAS ANALYZED FOR DNA MISMATCH REPAIR PROTEINS.IMMUNOHISTOCHEMICALLY, THE NEOPLASM RETAINED NUCLEAR EXPRESSION OF 4 GENE PRODUCTS, MLH1, MSH2, MSH6, AND PMS2, INVOLVED IN DNA MISMATCH REPAIR.POSITIVE AND NEGATIVE CONTROLS WORKED APPROPRIATELY.  PER REQUEST, AN ER AND PR ARE PERFORMED ON BLOCK 1I.  ER- APPROXIMATELY 25-35% STAINING IN NEOPLASTIC CELLS (INTERMEDIATE)  PR- APPROXIMATELY 25-35% STAINING IN NEOPLASTIC CELLS (STRONG)  ER AND PR PREDOMINANTLY SHOW STAINING IN THE  SEROUS COMPONENT.  DIAGNOSIS  1. UTERUS, CERVIX WITH BILATERAL  FALLOPIAN TUBES AND OVARIES,  HYSTERECTOMY AND BILATERAL SALPINGO-OOPHORECTOMY:  HIGH GRADE/POORLY DIFFERENTIATED ENDOMETRIAL ADENOCARCINOMA WITH SOLID AND SEROUS PAPILLARY COMPONENTS.  HISTOPATHOLOGIC TYPE:A VARIETY OF PATTERNS WERE PRESENT, ALL OF WHICH SHOULD BE CONSIDERED TO BE HIGH GRADE.SEROUS PAPILLARY CARCINOMA WAS SEEN OCCURRING ADJACENT TO SOLID ENDOMETRIAL CARCINOMA WITH MARKED ANAPLASIA AND GIANT CELLS. SIZE:TUMOR MEASURED AT LEAST 4.8 CM IN GREATEST HORIZONTAL DIMENSION.  GRADE: POORLY DIFFERENTIATED OR HIGH GRADE. DEPTH OF INVASION: NO DEFINITE MYOMETRIAL INVOLVEMENT WAS SEEN.THE TUMOR APPEARED CONFINED TO THE POLYP AS WELL AS SURFACE ENDOMETRIUM. WHERE MYOMETRIAL THICKNESS NOT APPLICABLE. SEROSAL INVOLVEMENT: NOT DEMONSTRATED.  ENDOCERVICAL INVOLVEMENT:NOT DEMONSTRATED. RESECTION MARGINS: FREE OF INVOLVEMENT. EXTRAUTERINE EXTENSION:NOT DEMONSTRATED. ANGIOLYMPHATIC INVASION: NOT DEFINITELY SEEN. TOTAL NODES EXAMINED:22.  PELVIC NODES EXAMINED:20.  PELVIC NODES INVOLVED:0.  PARA-AORTIC NODES 2. EXAMINED:  PARA-AORTIC NODES 0. INVOLVED TNM STAGE: T1A N0 MX. AJCC STAGE GROUPING: IA. FIGO STAGE:IA.      08/26/2012 Imaging    No CT evidence for intra-abdominal or pelvic metastatic disease. Trace free pelvic fluid, presumably postoperative although the date of surgery is not documented in the electronic medical record.      08/26/2012 - 12/13/2012 Chemotherapy    She received 6 cycles of carbo/taxol       10/29/2012 - 11/27/2012 Radiation Therapy    May 27, June 5,  June 11, June 19, November 27, 2012: Proximal vagina 30 Gy in 5 fractions        01/17/2013 Imaging    1.  No evidence of recurrent or metastatic disease. 2.  No acute abnormality involving the abdomen or pelvis. 3.  Mild diffuse hepatic steatosis. 4.  Very small supraumbilical midline anterior abdominal wall hernia  containing fat, unchanged      01/22/2014 Imaging    No evidence for metastatic or recurrent disease. 2. No bowel obstruction.  Normal appendix. 3. Small fat containing hernia, stable in appearance. 4. Status post hysterectomy and bilateral oophorectomy      02/19/2014 Imaging    No pulmonary lesions are identified. 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 No other sites of metastatic disease identified within the abdomen or pelvis.        11/27/2014 - 02/11/2015 Chemotherapy    She received 4 cycles of carbo/taxol       03/01/2015 PET scan    Single hypermetabolic small retroperitoneal lymph node along the aorta. 2. No evidence of metastatic disease otherwise in the abdomen or pelvis. No evidence local recurrence. 3. Intensely hypermetabolic enlarged nodule adjacent to the RIGHT lobe of thyroid gland. This presumably represents the biopsied lesion in clinician report which was found to be benign thyroid tissue.      04/05/2015 - 05/13/2015 Radiation Therapy    She received 50.4 gray in 28 fractions with simultaneous integrated boost to 56 gray      06/17/2015 Imaging    No acute process or evidence of metastatic disease in the abdomen or pelvis. Resolution of previously described retroperitoneal adenopathy. 2.  Possible constipation. 3. Atherosclerosis.      06/17/2015 Tumor Marker    Patient's tumor was tested for the following markers: CA125 Results of the tumor marker test revealed 33      08/12/2015 Tumor Marker    Patient's  tumor was tested for the following markers: CA125 Results of the tumor marker test revealed 52      08/31/2015 PET scan    Development of right paratracheal hypermetabolic adenopathy, consistent with nodal metastasis. 2.  The previously described isolated abdominal retroperitoneal hypermetabolic node has resolved. 3. Persistent hypermetabolic right thyroid nodule, per report previously biopsied. Correlate with those results. 4.  Possible constipation.      09/09/2015 -  Anti-estrogen oral therapy    She has been receiving alternative treatment between megace and tamoxifen      09/09/2015 Tumor Marker    Patient's tumor was tested for the following markers: CA125 Results of the tumor marker test revealed 37.8      09/20/2015 Procedure    Technically successful ultrasound-guided thyroid aspiration biopsy , dominant right nodule      09/20/2015 Pathology Results    THYROID, RIGHT, FINE NEEDLE ASPIRATION (SPECIMEN 1 OF 1 COLLECTED 09/20/15): FINDINGS CONSISTENT WITH BENIGN THYROID NODULE (BETHESDA CATEGORY II).      10/28/2015 Tumor Marker    Patient's tumor was tested for the following markers: CA125 Results of the tumor marker test revealed 53.2      11/04/2015 Imaging    Stable benign right thyroid nodule      12/16/2015 Tumor Marker    Patient's tumor was tested for the following markers: CA125 Results of the tumor marker test revealed 38.1      03/22/2016 PET scan    Interval progression of hypermetabolic right paratracheal lymph node consistent with metastatic involvement. 2. Stable hypermetabolic right thyroid nodule. Reportedly this has been biopsied in the past.      03/23/2016 Tumor Marker    Patient's tumor was tested for the following markers: CA125 Results of the tumor marker test revealed 62.4      04/13/2016 - 06/01/2016 Radiation Therapy    She received 56 Gy to the chest in 28 fractions       05/09/2016 Imaging    No evidence of left lower extremity deep vein thrombosis. No evidence of a superficial thrombosis of the greater and lesser saphenous veins. Positive for thrombus noted in several varicosities of the calf. No evidence of Baker's cyst on the left.      05/17/2016 Tumor  Marker    Patient's tumor was tested for the following markers: CA125 Results of the tumor marker test revealed 9      06/29/2016 Tumor Marker    Patient's tumor was tested for the following markers: CA125 Results of the tumor marker test revealed 5.9      08/04/2016 Tumor Marker    Patient's tumor was tested for the following markers: CA125 Results of the tumor marker test revealed 6.0      09/12/2016 PET scan    Complete metabolic response to therapy, with resolution of hypermetabolic mediastinal lymphadenopathy since prior exam. No residual or new metastatic disease identified. Stable hypermetabolic right thyroid lobe nodule, which was previously biopsied on 09/20/2015.      03/13/2017 PET scan    1. Solitary focus of recurrent right paratracheal hypermetabolic activity, with a 1.0 cm right lower paratracheal node having a maximum SUV of 9.0. Appearance compatible with recurrent malignancy. 2. Continued hypermetabolic right thyroid nodule, previously biopsied, presumed benign -correlate with prior biopsy results. 3. Other imaging findings of potential clinical significance: Aortic Atherosclerosis (ICD10-I70.0). Mild cardiomegaly. Prominent stool throughout the colon favors constipation.      04/02/2017 Pathology Results    FINE NEEDLE ASPIRATION, ENDOSCOPIC, (EBUS) 4  R NODE (SPECIMEN 1 OF 2 COLLECTED 04/02/17): MALIGNANT CELLS PRESENT, CONSISTENT WITH CARCINOMA. SEE COMMENT. COMMENT: THE MALIGNANT CELLS ARE POSITIVE FOR P53 AND NEGATIVE FOR ESTROGEN RECEPTOR AND TTF-1. THIS PROFILE IS NON-SPECIFIC, BUT THE P53 POSITIVE STAINING IS SUGGESTIVE OF GYNECOLOGIC PRIMARY.       04/11/2017 Procedure    Successful 8 French right internal jugular vein power port placement with its tip at the SVC/RA junction      04/12/2017 Imaging    Normal LV size with mild LV hypertrophy. EF 55-60%. Normal RV size and systolic function. Aortic valve sclerosis without significant stenosis.       04/18/2017 - 06/14/2017 Chemotherapy    The patient had chemotherapy with Doxil       07/10/2017 Imaging    - Left ventricle: The cavity size was normal. There was mild concentric hypertrophy. Systolic function was normal. The estimated ejection fraction was in the range of 55% to 60%. Wall motion was normal; there were no regional wall motion abnormalities. Doppler parameters are consistent with abnormal left ventricular relaxation (grade 1 diastolic dysfunction). - Aortic valve: Noncoronary cusp mobility was mildly restricted. - Mitral valve: There was mild regurgitation. - Left atrium: The atrium was mildly dilated. - Atrial septum: No defect or patent foramen ovale was identified.  Impressions:  - Compared to November 2018, global LV longitudinal strain remains normal (has increased)      07/11/2017 PET scan    Increased size and hypermetabolic activity of 3.3 cm right thyroid lobe nodule. Thyroid carcinoma cannot be excluded. Recommend repeat ultrasound guided fine needle aspiration to exclude thyroid carcinoma.  New adjacent hypermetabolic 10 mm right supraclavicular lymph node, suspicious for lymph node metastasis.  Slight increase in size and hypermetabolic activity of solitary right paratracheal lymph node.  No evidence of abdominal or pelvic metastatic disease.      07/18/2017 Procedure    1. Technically successful ultrasound guided fine needle aspiration of indeterminate hypermetabolic right-sided thyroid nodule/mass. 2. Technically successful ultrasound-guided core needle biopsy of hypermetabolic right lower cervical lymph node.      07/18/2017 Pathology Results    THYROID, FINE NEEDLE ASPIRATION RIGHT (SPECIMEN 1 OF 1, COLLECTED ON 07/18/2017): ATYPIA OF UNDETERMINED SIGNIFICANCE OR FOLLICULAR LESION OF UNDETERMINED SIGNIFICANCE (BETHESDA CATEGORY III). SEE COMMENT. COMMENT: THE SPECIMEN CONSISTS OF SMALL AND MEDIUM SIZED GROUPS OF FOLLICULAR EPITHELIAL CELLS WITH MILD  CYTOLOGIC ATYPIA INCLUDING NUCLEAR ENLARGEMENT AND HURTHLE CELL CHANGE. SOME GROUPS ARE ARRANGED AS MICROFOLLICLES. THERE IS MINIMAL BACKGROUND COLLOID. BASED ON THESE FEATURES, A FOLLICULAR LESION/NEOPLASM CAN NOT BE ENTIRELY RULED OUT. A SPECIMEN WILL BE SENT FOR AFIRMA TESTING.      07/19/2017 Pathology Results    Lymph node, needle/core biopsy - METASTATIC PAPILLARY SEROUS CARCINOMA. - SEE COMMENT. Microscopic Comment Dr. Vicente Males has reviewed the case and concurs with this interpretation       REVIEW OF SYSTEMS:   Constitutional: Denies fevers, chills or abnormal weight loss Eyes: Denies blurriness of vision Ears, nose, mouth, throat, and face: Denies mucositis or sore throat Respiratory: Denies cough, dyspnea or wheezes Cardiovascular: Denies palpitation, chest discomfort or lower extremity swelling Gastrointestinal:  Denies nausea, heartburn or change in bowel habits Skin: Denies abnormal skin rashes Lymphatics: Denies new lymphadenopathy or easy bruising Neurological:Denies numbness, tingling or new weaknesses Behavioral/Psych: Mood is stable, no new changes  All other systems were reviewed with the patient and are negative.  I have reviewed the past medical history, past surgical history, social history and family history with  the patient and they are unchanged from previous note.  ALLERGIES:  has No Known Allergies.  MEDICATIONS:  Current Outpatient Medications  Medication Sig Dispense Refill  . Calcium Carb-Cholecalciferol 500-600 MG-UNIT TABS Take 1 tablet by mouth daily.     . Cholecalciferol (VITAMIN D3) 2000 units TABS Take 2,000 Units by mouth daily.     . metoprolol tartrate (LOPRESSOR) 25 MG tablet Take 1 tablet (25 mg total) by mouth 2 (two) times daily. 60 tablet 3  . simvastatin (ZOCOR) 40 MG tablet Take 40 mg by mouth every evening.    . tacrolimus (PROTOPIC) 0.1 % ointment Apply 1 application topically daily as needed (rash).     . venlafaxine XR  (EFFEXOR-XR) 75 MG 24 hr capsule Take 75 mg by mouth daily with breakfast.      No current facility-administered medications for this visit.     PHYSICAL EXAMINATION: ECOG PERFORMANCE STATUS: 0 - Asymptomatic  Vitals:   08/27/17 1339  BP: (!) 151/82  Pulse: 85  Resp: 18  Temp: 97.9 F (36.6 C)  SpO2: 100%   Filed Weights   08/27/17 1339  Weight: 160 lb 6.4 oz (72.8 kg)    GENERAL:alert, no distress and comfortable SKIN: skin color, texture, turgor are normal, no rashes or significant lesions EYES: normal, Conjunctiva are pink and non-injected, sclera clear OROPHARYNX:no exudate, no erythema and lips, buccal mucosa, and tongue normal  NECK: supple, thyroid normal size, non-tender, without nodularity LYMPH:  no palpable lymphadenopathy in the cervical, axillary or inguinal LUNGS: clear to auscultation and percussion with normal breathing effort HEART: regular rate & rhythm and no murmurs and no lower extremity edema ABDOMEN:abdomen soft, non-tender and normal bowel sounds Musculoskeletal:no cyanosis of digits and no clubbing  NEURO: alert & oriented x 3 with fluent speech, no focal motor/sensory deficits  LABORATORY DATA:  I have reviewed the data as listed    Component Value Date/Time   NA 141 08/27/2017 1301   NA 141 05/17/2017 0957   K 4.1 08/27/2017 1301   K 3.6 05/17/2017 0957   CL 106 08/27/2017 1301   CL 105 11/19/2012 0848   CO2 28 08/27/2017 1301   CO2 25 05/17/2017 0957   GLUCOSE 85 08/27/2017 1301   GLUCOSE 85 05/17/2017 0957   GLUCOSE 122 (H) 11/19/2012 0848   BUN 18 08/27/2017 1301   BUN 15.2 05/17/2017 0957   CREATININE 0.83 08/27/2017 1301   CREATININE 0.8 05/17/2017 0957   CALCIUM 10.0 08/27/2017 1301   CALCIUM 9.6 05/17/2017 0957   PROT 7.5 08/27/2017 1301   PROT 7.0 05/17/2017 0957   ALBUMIN 3.8 08/27/2017 1301   ALBUMIN 3.9 05/17/2017 0957   AST 20 08/27/2017 1301   AST 25 05/17/2017 0957   ALT 17 08/27/2017 1301   ALT 18 05/17/2017 0957    ALKPHOS 66 08/27/2017 1301   ALKPHOS 57 05/17/2017 0957   BILITOT 0.4 08/27/2017 1301   BILITOT 0.47 05/17/2017 0957   GFRNONAA >60 08/27/2017 1301   GFRAA >60 08/27/2017 1301    No results found for: SPEP, UPEP  Lab Results  Component Value Date   WBC 3.7 (L) 08/27/2017   NEUTROABS 2.7 08/27/2017   HGB 10.9 (L) 06/14/2017   HCT 35.7 08/27/2017   MCV 97.5 08/27/2017   PLT 189 08/27/2017      Chemistry      Component Value Date/Time   NA 141 08/27/2017 1301   NA 141 05/17/2017 0957   K 4.1 08/27/2017 1301   K  3.6 05/17/2017 0957   CL 106 08/27/2017 1301   CL 105 11/19/2012 0848   CO2 28 08/27/2017 1301   CO2 25 05/17/2017 0957   BUN 18 08/27/2017 1301   BUN 15.2 05/17/2017 0957   CREATININE 0.83 08/27/2017 1301   CREATININE 0.8 05/17/2017 0957   GLU 158 (H) 01/14/2015 1035      Component Value Date/Time   CALCIUM 10.0 08/27/2017 1301   CALCIUM 9.6 05/17/2017 0957   ALKPHOS 66 08/27/2017 1301   ALKPHOS 57 05/17/2017 0957   AST 20 08/27/2017 1301   AST 25 05/17/2017 0957   ALT 17 08/27/2017 1301   ALT 18 05/17/2017 0957   BILITOT 0.4 08/27/2017 1301   BILITOT 0.47 05/17/2017 0957      All questions were answered. The patient knows to call the clinic with any problems, questions or concerns. No barriers to learning was detected.  I spent 15 minutes counseling the patient face to face. The total time spent in the appointment was 20 minutes and more than 50% was on counseling and review of test results  [name], MD 08/28/2017 8:03 AM

## 2017-08-28 NOTE — Assessment & Plan Note (Signed)
She tolerated single agent Avastin well without any side effects I did notice that her blood pressure is mildly elevated I recommend blood pressure monitoring twice a day. I have instructed the patient to call me if her blood pressure is persistently elevated with systolic blood pressure greater than 580 and diastolic blood pressure greater than 90 I plan minimum 3-4 doses of treatment before repeat imaging study.  She agreed to proceed

## 2017-08-31 DIAGNOSIS — H04123 Dry eye syndrome of bilateral lacrimal glands: Secondary | ICD-10-CM | POA: Diagnosis not present

## 2017-08-31 DIAGNOSIS — H25813 Combined forms of age-related cataract, bilateral: Secondary | ICD-10-CM | POA: Diagnosis not present

## 2017-08-31 DIAGNOSIS — H5203 Hypermetropia, bilateral: Secondary | ICD-10-CM | POA: Diagnosis not present

## 2017-08-31 DIAGNOSIS — H43813 Vitreous degeneration, bilateral: Secondary | ICD-10-CM | POA: Diagnosis not present

## 2017-09-10 MED FILL — VENLAFAXINE HCL ER 75 MG CA: 75 | 90 days supply | Qty: 90 | Fill #2

## 2017-09-17 ENCOUNTER — Encounter: Payer: Self-pay | Admitting: Hematology and Oncology

## 2017-09-17 ENCOUNTER — Inpatient Hospital Stay: Payer: 59

## 2017-09-17 ENCOUNTER — Telehealth: Payer: Self-pay | Admitting: Hematology and Oncology

## 2017-09-17 ENCOUNTER — Inpatient Hospital Stay (HOSPITAL_BASED_OUTPATIENT_CLINIC_OR_DEPARTMENT_OTHER): Payer: 59 | Admitting: Hematology and Oncology

## 2017-09-17 ENCOUNTER — Inpatient Hospital Stay: Payer: 59 | Attending: Hematology and Oncology

## 2017-09-17 VITALS — BP 149/74

## 2017-09-17 VITALS — BP 146/94 | HR 75 | Temp 97.6°F | Resp 18 | Ht 63.0 in | Wt 158.9 lb

## 2017-09-17 DIAGNOSIS — Z5112 Encounter for antineoplastic immunotherapy: Secondary | ICD-10-CM | POA: Diagnosis not present

## 2017-09-17 DIAGNOSIS — C786 Secondary malignant neoplasm of retroperitoneum and peritoneum: Secondary | ICD-10-CM | POA: Diagnosis not present

## 2017-09-17 DIAGNOSIS — Z90722 Acquired absence of ovaries, bilateral: Secondary | ICD-10-CM | POA: Insufficient documentation

## 2017-09-17 DIAGNOSIS — Z7189 Other specified counseling: Secondary | ICD-10-CM

## 2017-09-17 DIAGNOSIS — C770 Secondary and unspecified malignant neoplasm of lymph nodes of head, face and neck: Secondary | ICD-10-CM

## 2017-09-17 DIAGNOSIS — Z90710 Acquired absence of both cervix and uterus: Secondary | ICD-10-CM | POA: Diagnosis not present

## 2017-09-17 DIAGNOSIS — C771 Secondary and unspecified malignant neoplasm of intrathoracic lymph nodes: Secondary | ICD-10-CM

## 2017-09-17 DIAGNOSIS — Z923 Personal history of irradiation: Secondary | ICD-10-CM | POA: Diagnosis not present

## 2017-09-17 DIAGNOSIS — D701 Agranulocytosis secondary to cancer chemotherapy: Secondary | ICD-10-CM | POA: Insufficient documentation

## 2017-09-17 DIAGNOSIS — C541 Malignant neoplasm of endometrium: Secondary | ICD-10-CM | POA: Diagnosis not present

## 2017-09-17 DIAGNOSIS — I10 Essential (primary) hypertension: Secondary | ICD-10-CM | POA: Diagnosis not present

## 2017-09-17 DIAGNOSIS — T451X5S Adverse effect of antineoplastic and immunosuppressive drugs, sequela: Secondary | ICD-10-CM | POA: Insufficient documentation

## 2017-09-17 DIAGNOSIS — Z9221 Personal history of antineoplastic chemotherapy: Secondary | ICD-10-CM

## 2017-09-17 DIAGNOSIS — Z79899 Other long term (current) drug therapy: Secondary | ICD-10-CM | POA: Insufficient documentation

## 2017-09-17 DIAGNOSIS — Z95828 Presence of other vascular implants and grafts: Secondary | ICD-10-CM

## 2017-09-17 DIAGNOSIS — T451X5A Adverse effect of antineoplastic and immunosuppressive drugs, initial encounter: Secondary | ICD-10-CM

## 2017-09-17 LAB — COMPREHENSIVE METABOLIC PANEL
ALT: 18 U/L (ref 0–55)
AST: 22 U/L (ref 5–34)
Albumin: 3.9 g/dL (ref 3.5–5.0)
Alkaline Phosphatase: 68 U/L (ref 40–150)
Anion gap: 8 (ref 3–11)
BUN: 17 mg/dL (ref 7–26)
CO2: 26 mmol/L (ref 22–29)
Calcium: 10.2 mg/dL (ref 8.4–10.4)
Chloride: 106 mmol/L (ref 98–109)
Creatinine, Ser: 0.76 mg/dL (ref 0.60–1.10)
GFR calc Af Amer: >60 mL/min (ref >60)
GFR calc non Af Amer: >60 mL/min (ref >60)
Glucose, Bld: 85 mg/dL (ref 70–140)
Potassium: 4.1 mmol/L (ref 3.5–5.1)
Sodium: 140 mmol/L (ref 136–145)
Total Bilirubin: 0.4 mg/dL (ref 0.2–1.2)
Total Protein: 7.3 g/dL (ref 6.4–8.3)

## 2017-09-17 LAB — CBC WITH DIFFERENTIAL (CANCER CENTER ONLY)
Basophils Absolute: 0 10*3/uL (ref 0.0–0.1)
Basophils Relative: 1 %
Eosinophils Absolute: 0.2 10*3/uL (ref 0.0–0.5)
Eosinophils Relative: 5 %
HCT: 35.8 % (ref 34.8–46.6)
Hemoglobin: 12 g/dL (ref 11.6–15.9)
Lymphocytes Relative: 23 %
Lymphs Abs: 0.7 10*3/uL — ABNORMAL LOW (ref 0.9–3.3)
MCH: 32.5 pg (ref 25.1–34.0)
MCHC: 33.5 g/dL (ref 31.5–36.0)
MCV: 97 fL (ref 79.5–101.0)
Monocytes Absolute: 0.1 10*3/uL (ref 0.1–0.9)
Monocytes Relative: 3 %
Neutro Abs: 2.2 10*3/uL (ref 1.5–6.5)
Neutrophils Relative %: 68 %
Platelet Count: 151 10*3/uL (ref 145–400)
RBC: 3.69 MIL/uL — ABNORMAL LOW (ref 3.70–5.45)
RDW: 12.8 % (ref 11.2–14.5)
WBC Count: 3.2 10*3/uL — ABNORMAL LOW (ref 3.9–10.3)

## 2017-09-17 LAB — TOTAL PROTEIN, URINE DIPSTICK: Protein, ur: NEGATIVE mg/dL

## 2017-09-17 MED ORDER — SODIUM CHLORIDE 0.9 % IV SOLN
1100.0000 mg | Freq: Once | INTRAVENOUS | Status: AC
  Administered 2017-09-17: 1100 mg via INTRAVENOUS
  Filled 2017-09-17: qty 32

## 2017-09-17 MED ORDER — SODIUM CHLORIDE 0.9% FLUSH
10.0000 mL | INTRAVENOUS | Status: DC | PRN
  Administered 2017-09-17: 10 mL
  Filled 2017-09-17: qty 10

## 2017-09-17 MED ORDER — SODIUM CHLORIDE 0.9 % IV SOLN
Freq: Once | INTRAVENOUS | Status: AC
  Administered 2017-09-17: 15:00:00 via INTRAVENOUS

## 2017-09-17 MED ORDER — SODIUM CHLORIDE 0.9% FLUSH
10.0000 mL | Freq: Once | INTRAVENOUS | Status: AC
  Administered 2017-09-17: 10 mL
  Filled 2017-09-17: qty 10

## 2017-09-17 MED ORDER — HEPARIN SOD (PORK) LOCK FLUSH 100 UNIT/ML IV SOLN
500.0000 [IU] | Freq: Once | INTRAVENOUS | Status: AC | PRN
Start: 2017-09-17 — End: 2017-09-17
  Administered 2017-09-17: 500 [IU]
  Filled 2017-09-17: qty 5

## 2017-09-17 MED ORDER — METOPROLOL TARTRATE 50 MG PO TABS: 50.0000 mg | ORAL_TABLET | Freq: Two times a day (BID) | ORAL | 1 refills | Status: DC

## 2017-09-17 MED FILL — METOPROLOL TARTRATE 50 MG T: 50 | 30 days supply | Qty: 60 | Fill #0

## 2017-09-17 NOTE — Progress Notes (Signed)
[place] OFFICE PROGRESS NOTE  Patient Care Team: [name], MD as PCP - General (Family Medicine) Reynold Bowen, MD as Consulting Physician (Endocrinology)  ASSESSMENT & PLAN:  Endometrial cancer [facility]) She tolerated single agent Avastin well without any side effects except for hypertension I recommend blood pressure monitoring twice a day. I have instructed the patient to call me if her blood pressure is persistently elevated with systolic blood pressure greater than 242 and diastolic blood pressure greater than 90 I plan to repeat imaging next month  Essential hypertension Her blood pressure is poorly controlled I recommend increasing metoprolol to 50 mg twice a day The patient is educated to watch and document her blood pressure twice a day She will contact me next week with blood pressure results She is also instructed to hold metoprolol if she becomes bradycardic.  Leukopenia due to antineoplastic chemotherapy [facility]) She has chronic leukopenia which I do not know whether could be related to her prior chemotherapy or not She is not symptomatic Observe   Orders Placed This Encounter  Procedures  . NM PET Image Restag (PS) Skull Base To Thigh    Standing Status:   Future    Standing Expiration Date:   09/18/2018    Order Specific Question:   If indicated for the ordered procedure, I authorize the administration of a radiopharmaceutical per Radiology protocol    Answer:   Yes    Order Specific Question:   Preferred imaging location?    Answer:   [facility]    Order Specific Question:   Radiology Contrast Protocol - do NOT remove file path    Answer:   [email or web address]    INTERVAL HISTORY: Please see below for problem oriented charting. She returns for further follow-up She denies recent infection She denies recent headache or bleeding complications from treatment No changes in bowel habits, nausea or abdominal  bloating  SUMMARY OF ONCOLOGIC HISTORY: [place] One testing done 11-2015 on surgical path from 2014: MS stable TMB low 4 muts/mb ATM [id number] ERBB3 T389K E2H2 rearrangement exon 9 PPP2R1A P179R TP53 I195N    ER- APPROXIMATELY 25-35% STAINING IN NEOPLASTIC CELLS (INTERMEDIATE)  PR- APPROXIMATELY 25-35% STAINING IN NEOPLASTIC CELLS (STRONG)  Repeat biopsy 04/02/17: ER negative, Her 2 negative     Endometrial cancer ([place])   06/20/2012 Pathology Results    Biopsy positive for papillary serous carcinoma      06/20/2012 Genetic Testing    [place] One testing done 11-2015 on surgical path from 2014: MS stable TMB low 4 muts/mb ATM [id number] ERBB3 T389K E2H2 rearrangement exon 9 PPP2R1A P179R TP53 I195N       06/20/2012 Initial Diagnosis    Patient presented to PCP with intermittent vaginal bleeding since ~ Oct 2013, endometrial biopsy 06-20-12 with complex endometrial hyperplasia with atypia      06/26/2012 Imaging    Thickened endometrial lining in a postmenopausal patient experiencing vaginal bleeding. In the setting of post-menopausal bleeding, endometrial sampling is indicated to exclude carcinoma. No focal myometrial abnormalities are seen.  Normal left ovary and non-visualized right ovary      07/30/2012 Surgery    Dr. Polly Cobia performed robotic hysterectomy with bilateral salpingo-oophorectomy, bilateral pelvic lymph node dissection and periaortic lymph node dissection. Intraoperatively on frozen section, the patient was noted to have a large endometrial polyp with changes within the polyp consistent for high-grade malignancy, possibly papillary serous carcinoma. There is no obvious extrauterine disease noted.  07/30/2012 Pathology Results    [phone] SUPPLEMENTAL REPORT  THE ENDOMETRIAL CARCINOMA WAS ANALYZED FOR DNA MISMATCH REPAIR PROTEINS.IMMUNOHISTOCHEMICALLY, THE NEOPLASM RETAINED NUCLEAR EXPRESSION  OF 4 GENE PRODUCTS, MLH1, MSH2, MSH6, AND PMS2, INVOLVED IN DNA MISMATCH REPAIR.POSITIVE AND NEGATIVE CONTROLS WORKED APPROPRIATELY.  PER REQUEST, AN ER AND PR ARE PERFORMED ON BLOCK 1I.  ER- APPROXIMATELY 25-35% STAINING IN NEOPLASTIC CELLS (INTERMEDIATE)  PR- APPROXIMATELY 25-35% STAINING IN NEOPLASTIC CELLS (STRONG)  ER AND PR PREDOMINANTLY SHOW STAINING IN THE SEROUS COMPONENT.  DIAGNOSIS  1. UTERUS, CERVIX WITH BILATERAL FALLOPIAN TUBES AND OVARIES,  HYSTERECTOMY AND BILATERAL SALPINGO-OOPHORECTOMY:  HIGH GRADE/POORLY DIFFERENTIATED ENDOMETRIAL ADENOCARCINOMA WITH SOLID AND SEROUS PAPILLARY COMPONENTS.  HISTOPATHOLOGIC TYPE:A VARIETY OF PATTERNS WERE PRESENT, ALL OF WHICH SHOULD BE CONSIDERED TO BE HIGH GRADE.SEROUS PAPILLARY CARCINOMA WAS SEEN OCCURRING ADJACENT TO SOLID ENDOMETRIAL CARCINOMA WITH MARKED ANAPLASIA AND GIANT CELLS. SIZE:TUMOR MEASURED AT LEAST 4.8 CM IN GREATEST HORIZONTAL DIMENSION.  GRADE: POORLY DIFFERENTIATED OR HIGH GRADE. DEPTH OF INVASION: NO DEFINITE MYOMETRIAL INVOLVEMENT WAS SEEN.THE TUMOR APPEARED CONFINED TO THE POLYP AS WELL AS SURFACE ENDOMETRIUM. WHERE MYOMETRIAL THICKNESS NOT APPLICABLE. SEROSAL INVOLVEMENT: NOT DEMONSTRATED.  ENDOCERVICAL INVOLVEMENT:NOT DEMONSTRATED. RESECTION MARGINS: FREE OF INVOLVEMENT. EXTRAUTERINE EXTENSION:NOT DEMONSTRATED. ANGIOLYMPHATIC INVASION: NOT DEFINITELY SEEN. TOTAL NODES EXAMINED:22.  PELVIC NODES EXAMINED:20.  PELVIC NODES INVOLVED:0.  PARA-AORTIC NODES 2. EXAMINED:  PARA-AORTIC NODES 0. INVOLVED TNM STAGE: T1A N0 MX. AJCC STAGE GROUPING: IA. FIGO STAGE:IA.      08/26/2012 Imaging    No CT evidence for intra-abdominal or pelvic metastatic disease. Trace free pelvic fluid, presumably postoperative although the date of surgery is not documented in the electronic medical record.      08/26/2012 - 12/13/2012  Chemotherapy    She received 6 cycles of carbo/taxol       10/29/2012 - 11/27/2012 Radiation Therapy    May 27, June 5,  June 11, June 19, November 27, 2012: Proximal vagina 30 Gy in 5 fractions        01/17/2013 Imaging    1.  No evidence of recurrent or metastatic disease. 2.  No acute abnormality involving the abdomen or pelvis. 3.  Mild diffuse hepatic steatosis. 4.  Very small supraumbilical midline anterior abdominal wall hernia containing fat, unchanged      01/22/2014 Imaging    No evidence for metastatic or recurrent disease. 2. No bowel obstruction.  Normal appendix. 3. Small fat containing hernia, stable in appearance. 4. Status post hysterectomy and bilateral oophorectomy      02/19/2014 Imaging    No pulmonary lesions are identified. 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 No other sites of metastatic disease identified within the abdomen or pelvis.        11/27/2014 - 02/11/2015 Chemotherapy    She received 4 cycles of carbo/taxol       03/01/2015 PET scan    Single hypermetabolic small retroperitoneal lymph node along the aorta. 2. No evidence of metastatic disease otherwise in the abdomen or pelvis. No evidence local recurrence. 3. Intensely hypermetabolic enlarged nodule adjacent to the RIGHT lobe of thyroid gland. This presumably represents the biopsied lesion in clinician report which was found to be benign thyroid tissue.      04/05/2015 - 05/13/2015 Radiation Therapy    She received 50.4 gray in 28  fractions with simultaneous integrated boost to 56 gray      06/17/2015 Imaging    No acute process or evidence of metastatic disease in the abdomen or pelvis. Resolution of previously described retroperitoneal adenopathy. 2.   Possible constipation. 3. Atherosclerosis.      06/17/2015 Tumor Marker    Patient's tumor was tested for the following markers: CA125 Results of the tumor marker test revealed 33      08/12/2015 Tumor Marker    Patient's tumor was tested for the following markers: CA125 Results of the tumor marker test revealed 52      08/31/2015 PET scan    Development of right paratracheal hypermetabolic adenopathy, consistent with nodal metastasis. 2. The previously described isolated abdominal retroperitoneal hypermetabolic node has resolved. 3. Persistent hypermetabolic right thyroid nodule, per report previously biopsied. Correlate with those results. 4.  Possible constipation.      09/09/2015 -  Anti-estrogen oral therapy    She has been receiving alternative treatment between megace and tamoxifen      09/09/2015 Tumor Marker    Patient's tumor was tested for the following markers: CA125 Results of the tumor marker test revealed 37.8      09/20/2015 Procedure    Technically successful ultrasound-guided thyroid aspiration biopsy , dominant right nodule      09/20/2015 Pathology Results    THYROID, RIGHT, FINE NEEDLE ASPIRATION (SPECIMEN 1 OF 1 COLLECTED 09/20/15): FINDINGS CONSISTENT WITH BENIGN THYROID NODULE (BETHESDA CATEGORY II).      10/28/2015 Tumor Marker    Patient's tumor was tested for the following markers: CA125 Results of the tumor marker test revealed 53.2      11/04/2015 Imaging    Stable benign right thyroid nodule      12/16/2015 Tumor Marker    Patient's tumor was tested for the following markers: CA125 Results of the tumor marker test revealed 38.1      03/22/2016 PET scan    Interval progression of hypermetabolic right paratracheal lymph node consistent with metastatic involvement. 2. Stable hypermetabolic right thyroid nodule. Reportedly this has been biopsied in the past.      03/23/2016 Tumor Marker    Patient's tumor was tested for the following markers:  CA125 Results of the tumor marker test revealed 62.4      04/13/2016 - 06/01/2016 Radiation Therapy    She received 56 Gy to the chest in 28 fractions       05/09/2016 Imaging    No evidence of left lower extremity deep vein thrombosis. No evidence of a superficial thrombosis of the greater and lesser saphenous veins. Positive for thrombus noted in several varicosities of the calf. No evidence of Baker's cyst on the left.      05/17/2016 Tumor Marker    Patient's tumor was tested for the following markers: CA125 Results of the tumor marker test revealed 9      06/29/2016 Tumor Marker    Patient's tumor was tested for the following markers: CA125 Results of the tumor marker test revealed 5.9      08/04/2016 Tumor Marker    Patient's tumor was tested for the following markers: CA125 Results of the tumor marker test revealed 6.0      09/12/2016 PET scan    Complete metabolic response to therapy, with resolution of hypermetabolic mediastinal lymphadenopathy since prior exam. No residual or new metastatic disease identified. Stable hypermetabolic right thyroid lobe nodule, which was previously biopsied on 09/20/2015.      03/13/2017 PET scan  1. Solitary focus of recurrent right paratracheal hypermetabolic activity, with a 1.0 cm right lower paratracheal node having a maximum SUV of 9.0. Appearance compatible with recurrent malignancy. 2. Continued hypermetabolic right thyroid nodule, previously biopsied, presumed benign -correlate with prior biopsy results. 3. Other imaging findings of potential clinical significance: Aortic Atherosclerosis (ICD10-I70.0). Mild cardiomegaly. Prominent stool throughout the colon favors constipation.      04/02/2017 Pathology Results    FINE NEEDLE ASPIRATION, ENDOSCOPIC, (EBUS) 4 R NODE (SPECIMEN 1 OF 2 COLLECTED 04/02/17): MALIGNANT CELLS PRESENT, CONSISTENT WITH CARCINOMA. SEE COMMENT. COMMENT: THE MALIGNANT CELLS ARE POSITIVE FOR P53 AND NEGATIVE  FOR ESTROGEN RECEPTOR AND TTF-1. THIS PROFILE IS NON-SPECIFIC, BUT THE P53 POSITIVE STAINING IS SUGGESTIVE OF GYNECOLOGIC PRIMARY.       04/11/2017 Procedure    Successful 8 French right internal jugular vein power port placement with its tip at the SVC/RA junction      04/12/2017 Imaging    Normal LV size with mild LV hypertrophy. EF 55-60%. Normal RV size and systolic function. Aortic valve sclerosis without significant stenosis.      04/18/2017 - 06/14/2017 Chemotherapy    The patient had chemotherapy with Doxil       07/10/2017 Imaging    - Left ventricle: The cavity size was normal. There was mild concentric hypertrophy. Systolic function was normal. The estimated ejection fraction was in the range of 55% to 60%. Wall motion was normal; there were no regional wall motion abnormalities. Doppler parameters are consistent with abnormal left ventricular relaxation (grade 1 diastolic dysfunction). - Aortic valve: Noncoronary cusp mobility was mildly restricted. - Mitral valve: There was mild regurgitation. - Left atrium: The atrium was mildly dilated. - Atrial septum: No defect or patent foramen ovale was identified.  Impressions:  - Compared to November 2018, global LV longitudinal strain remains normal (has increased)      07/11/2017 PET scan    Increased size and hypermetabolic activity of 3.3 cm right thyroid lobe nodule. Thyroid carcinoma cannot be excluded. Recommend repeat ultrasound guided fine needle aspiration to exclude thyroid carcinoma.  New adjacent hypermetabolic 10 mm right supraclavicular lymph node, suspicious for lymph node metastasis.  Slight increase in size and hypermetabolic activity of solitary right paratracheal lymph node.  No evidence of abdominal or pelvic metastatic disease.      07/18/2017 Procedure    1. Technically successful ultrasound guided fine needle aspiration of indeterminate hypermetabolic right-sided thyroid nodule/mass. 2. Technically  successful ultrasound-guided core needle biopsy of hypermetabolic right lower cervical lymph node.      07/18/2017 Pathology Results    THYROID, FINE NEEDLE ASPIRATION RIGHT (SPECIMEN 1 OF 1, COLLECTED ON 07/18/2017): ATYPIA OF UNDETERMINED SIGNIFICANCE OR FOLLICULAR LESION OF UNDETERMINED SIGNIFICANCE (BETHESDA CATEGORY III). SEE COMMENT. COMMENT: THE SPECIMEN CONSISTS OF SMALL AND MEDIUM SIZED GROUPS OF FOLLICULAR EPITHELIAL CELLS WITH MILD CYTOLOGIC ATYPIA INCLUDING NUCLEAR ENLARGEMENT AND HURTHLE CELL CHANGE. SOME GROUPS ARE ARRANGED AS MICROFOLLICLES. THERE IS MINIMAL BACKGROUND COLLOID. BASED ON THESE FEATURES, A FOLLICULAR LESION/NEOPLASM CAN NOT BE ENTIRELY RULED OUT. A SPECIMEN WILL BE SENT FOR AFIRMA TESTING.      07/19/2017 Pathology Results    Lymph node, needle/core biopsy - METASTATIC PAPILLARY SEROUS CARCINOMA. - SEE COMMENT. Microscopic Comment Dr. Vicente Males has reviewed the case and concurs with this interpretation       REVIEW OF SYSTEMS:   Constitutional: Denies fevers, chills or abnormal weight loss Eyes: Denies blurriness of vision Ears, nose, mouth, throat, and face: Denies mucositis or sore  throat Respiratory: Denies cough, dyspnea or wheezes Cardiovascular: Denies palpitation, chest discomfort or lower extremity swelling Gastrointestinal:  Denies nausea, heartburn or change in bowel habits Skin: Denies abnormal skin rashes Lymphatics: Denies new lymphadenopathy or easy bruising Neurological:Denies numbness, tingling or new weaknesses Behavioral/Psych: Mood is stable, no new changes  All other systems were reviewed with the patient and are negative.  I have reviewed the past medical history, past surgical history, social history and family history with the patient and they are unchanged from previous note.  ALLERGIES:  has No Known Allergies.  MEDICATIONS:  Current Outpatient Medications  Medication Sig Dispense Refill  . Calcium Carb-Cholecalciferol  500-600 MG-UNIT TABS Take 1 tablet by mouth daily.     . Cholecalciferol (VITAMIN D3) 2000 units TABS Take 2,000 Units by mouth daily.     . metoprolol tartrate (LOPRESSOR) 50 MG tablet Take 1 tablet (50 mg total) by mouth 2 (two) times daily. 60 tablet 1  . simvastatin (ZOCOR) 40 MG tablet Take 40 mg by mouth every evening.    . tacrolimus (PROTOPIC) 0.1 % ointment Apply 1 application topically daily as needed (rash).     . venlafaxine XR (EFFEXOR-XR) 75 MG 24 hr capsule Take 75 mg by mouth daily with breakfast.      No current facility-administered medications for this visit.    Facility-Administered Medications Ordered in Other Visits  Medication Dose Route Frequency Provider Last Rate Last Dose  . bevacizumab (AVASTIN) 1,100 mg in sodium chloride 0.9 % 100 mL chemo infusion  1,100 mg Intravenous Once [name], MD 288 mL/hr at 09/17/17 1507 1,100 mg at 09/17/17 1507  . heparin lock flush 100 unit/mL  500 Units Intracatheter Once PRN Alvy Bimler, Karla Pavone, MD      . sodium chloride flush (NS) 0.9 % injection 10 mL  10 mL Intracatheter PRN Alvy Bimler, Kandance Yano, MD        PHYSICAL EXAMINATION: ECOG PERFORMANCE STATUS: 1 - Symptomatic but completely ambulatory  Vitals:   09/17/17 1336  BP: (!) 146/94  Pulse: 75  Resp: 18  Temp: 97.6 F (36.4 C)  SpO2: 99%   Filed Weights   09/17/17 1336  Weight: 158 lb 14.4 oz (72.1 kg)    GENERAL:alert, no distress and comfortable SKIN: skin color, texture, turgor are normal, no rashes or significant lesions EYES: normal, Conjunctiva are pink and non-injected, sclera clear OROPHARYNX:no exudate, no erythema and lips, buccal mucosa, and tongue normal  NECK: supple, thyroid normal size, non-tender, without nodularity LYMPH:  no palpable lymphadenopathy in the cervical, axillary or inguinal LUNGS: clear to auscultation and percussion with normal breathing effort HEART: regular rate & rhythm and no murmurs and no lower extremity edema ABDOMEN:abdomen soft,  non-tender and normal bowel sounds Musculoskeletal:no cyanosis of digits and no clubbing  NEURO: alert & oriented x 3 with fluent speech, no focal motor/sensory deficits  LABORATORY DATA:  I have reviewed the data as listed    Component Value Date/Time   NA 140 09/17/2017 1259   NA 141 05/17/2017 0957   K 4.1 09/17/2017 1259   K 3.6 05/17/2017 0957   CL 106 09/17/2017 1259   CL 105 11/19/2012 0848   CO2 26 09/17/2017 1259   CO2 25 05/17/2017 0957   GLUCOSE 85 09/17/2017 1259   GLUCOSE 85 05/17/2017 0957   GLUCOSE 122 (H) 11/19/2012 0848   BUN 17 09/17/2017 1259   BUN 15.2 05/17/2017 0957   CREATININE 0.76 09/17/2017 1259   CREATININE 0.8 05/17/2017 0957  CALCIUM 10.2 09/17/2017 1259   CALCIUM 9.6 05/17/2017 0957   PROT 7.3 09/17/2017 1259   PROT 7.0 05/17/2017 0957   ALBUMIN 3.9 09/17/2017 1259   ALBUMIN 3.9 05/17/2017 0957   AST 22 09/17/2017 1259   AST 25 05/17/2017 0957   ALT 18 09/17/2017 1259   ALT 18 05/17/2017 0957   ALKPHOS 68 09/17/2017 1259   ALKPHOS 57 05/17/2017 0957   BILITOT 0.4 09/17/2017 1259   BILITOT 0.47 05/17/2017 0957   GFRNONAA >60 09/17/2017 1259   GFRAA >60 09/17/2017 1259    No results found for: SPEP, UPEP  Lab Results  Component Value Date   WBC 3.2 (L) 09/17/2017   NEUTROABS 2.2 09/17/2017   HGB 10.9 (L) 06/14/2017   HCT 35.8 09/17/2017   MCV 97.0 09/17/2017   PLT 151 09/17/2017      Chemistry      Component Value Date/Time   NA 140 09/17/2017 1259   NA 141 05/17/2017 0957   K 4.1 09/17/2017 1259   K 3.6 05/17/2017 0957   CL 106 09/17/2017 1259   CL 105 11/19/2012 0848   CO2 26 09/17/2017 1259   CO2 25 05/17/2017 0957   BUN 17 09/17/2017 1259   BUN 15.2 05/17/2017 0957   CREATININE 0.76 09/17/2017 1259   CREATININE 0.8 05/17/2017 0957   GLU 158 (H) 01/14/2015 1035      Component Value Date/Time   CALCIUM 10.2 09/17/2017 1259   CALCIUM 9.6 05/17/2017 0957   ALKPHOS 68 09/17/2017 1259   ALKPHOS 57 05/17/2017 0957    AST 22 09/17/2017 1259   AST 25 05/17/2017 0957   ALT 18 09/17/2017 1259   ALT 18 05/17/2017 0957   BILITOT 0.4 09/17/2017 1259   BILITOT 0.47 05/17/2017 0957      All questions were answered. The patient knows to call the clinic with any problems, questions or concerns. No barriers to learning was detected.  I spent 15 minutes counseling the patient face to face. The total time spent in the appointment was 20 minutes and more than 50% was on counseling and review of test results  [name], MD 09/17/2017 3:28 PM

## 2017-09-17 NOTE — Telephone Encounter (Signed)
Gave patient AVs and calendar of upcoming may appointments.  °

## 2017-09-17 NOTE — Assessment & Plan Note (Signed)
She has chronic leukopenia which I do not know whether could be related to her prior chemotherapy or not She is not symptomatic Observe

## 2017-09-17 NOTE — Assessment & Plan Note (Addendum)
She tolerated single agent Avastin well without any side effects except for hypertension I recommend blood pressure monitoring twice a day. I have instructed the patient to call me if her blood pressure is persistently elevated with systolic blood pressure greater than 702 and diastolic blood pressure greater than 90 I plan to repeat imaging next month

## 2017-09-17 NOTE — Assessment & Plan Note (Signed)
Her blood pressure is poorly controlled I recommend increasing metoprolol to 50 mg twice a day The patient is educated to watch and document her blood pressure twice a day She will contact me next week with blood pressure results She is also instructed to hold metoprolol if she becomes bradycardic.

## 2017-09-17 NOTE — Patient Instructions (Signed)
[facility] Discharge Instructions for Patients Receiving Chemotherapy  Today you received the following chemotherapy agents avastin  To help prevent nausea and vomiting after your treatment, we encourage you to take your nausea medication as directed   If you develop nausea and vomiting that is not controlled by your nausea medication, call the clinic.   BELOW ARE SYMPTOMS THAT SHOULD BE REPORTED IMMEDIATELY:  *FEVER GREATER THAN 100.5 F  *CHILLS WITH OR WITHOUT FEVER  NAUSEA AND VOMITING THAT IS NOT CONTROLLED WITH YOUR NAUSEA MEDICATION  *UNUSUAL SHORTNESS OF BREATH  *UNUSUAL BRUISING OR BLEEDING  TENDERNESS IN MOUTH AND THROAT WITH OR WITHOUT PRESENCE OF ULCERS  *URINARY PROBLEMS  *BOWEL PROBLEMS  UNUSUAL RASH Items with * indicate a potential emergency and should be followed up as soon as possible.  Feel free to call the clinic you have any questions or concerns. The clinic phone number is [phone].  

## 2017-09-25 DIAGNOSIS — L309 Dermatitis, unspecified: Secondary | ICD-10-CM | POA: Diagnosis not present

## 2017-09-25 DIAGNOSIS — D485 Neoplasm of uncertain behavior of skin: Secondary | ICD-10-CM | POA: Diagnosis not present

## 2017-09-30 ENCOUNTER — Encounter: Payer: Self-pay | Admitting: Hematology and Oncology

## 2017-10-01 ENCOUNTER — Other Ambulatory Visit: Payer: Self-pay | Admitting: Hematology and Oncology

## 2017-10-01 DIAGNOSIS — I10 Essential (primary) hypertension: Secondary | ICD-10-CM

## 2017-10-01 DIAGNOSIS — C541 Malignant neoplasm of endometrium: Secondary | ICD-10-CM

## 2017-10-01 MED ORDER — AMLODIPINE BESYLATE 10 MG PO TABS: 10.0000 mg | ORAL_TABLET | Freq: Every day | ORAL | 0 refills | Status: DC

## 2017-10-01 MED FILL — AMLODIPINE BESYLATE 10 MG T: 10 | 90 days supply | Qty: 90 | Fill #0

## 2017-10-08 ENCOUNTER — Inpatient Hospital Stay: Payer: 59

## 2017-10-08 ENCOUNTER — Other Ambulatory Visit: Payer: 59

## 2017-10-08 ENCOUNTER — Inpatient Hospital Stay: Payer: 59 | Attending: Hematology and Oncology

## 2017-10-08 ENCOUNTER — Encounter: Payer: Self-pay | Admitting: Hematology and Oncology

## 2017-10-08 ENCOUNTER — Ambulatory Visit: Payer: 59 | Admitting: Hematology and Oncology

## 2017-10-08 VITALS — BP 138/81 | HR 60 | Temp 98.2°F | Resp 17 | Ht 63.0 in | Wt 158.5 lb

## 2017-10-08 DIAGNOSIS — Z7189 Other specified counseling: Secondary | ICD-10-CM

## 2017-10-08 DIAGNOSIS — I10 Essential (primary) hypertension: Secondary | ICD-10-CM | POA: Diagnosis not present

## 2017-10-08 DIAGNOSIS — Z5112 Encounter for antineoplastic immunotherapy: Secondary | ICD-10-CM | POA: Diagnosis not present

## 2017-10-08 DIAGNOSIS — R946 Abnormal results of thyroid function studies: Secondary | ICD-10-CM | POA: Insufficient documentation

## 2017-10-08 DIAGNOSIS — Z9221 Personal history of antineoplastic chemotherapy: Secondary | ICD-10-CM | POA: Diagnosis not present

## 2017-10-08 DIAGNOSIS — Z90710 Acquired absence of both cervix and uterus: Secondary | ICD-10-CM | POA: Diagnosis not present

## 2017-10-08 DIAGNOSIS — C541 Malignant neoplasm of endometrium: Secondary | ICD-10-CM | POA: Diagnosis not present

## 2017-10-08 DIAGNOSIS — Z90722 Acquired absence of ovaries, bilateral: Secondary | ICD-10-CM | POA: Insufficient documentation

## 2017-10-08 DIAGNOSIS — C770 Secondary and unspecified malignant neoplasm of lymph nodes of head, face and neck: Secondary | ICD-10-CM

## 2017-10-08 DIAGNOSIS — Z923 Personal history of irradiation: Secondary | ICD-10-CM | POA: Diagnosis not present

## 2017-10-08 LAB — COMPREHENSIVE METABOLIC PANEL
ALT: 16 U/L (ref 0–55)
AST: 21 U/L (ref 5–34)
Albumin: 3.8 g/dL (ref 3.5–5.0)
Alkaline Phosphatase: 57 U/L (ref 40–150)
Anion gap: 7 (ref 3–11)
BUN: 18 mg/dL (ref 7–26)
CO2: 26 mmol/L (ref 22–29)
Calcium: 10 mg/dL (ref 8.4–10.4)
Chloride: 107 mmol/L (ref 98–109)
Creatinine, Ser: 0.79 mg/dL (ref 0.60–1.10)
GFR calc Af Amer: >60 mL/min (ref >60)
GFR calc non Af Amer: >60 mL/min (ref >60)
Glucose, Bld: 97 mg/dL (ref 70–140)
Potassium: 4.2 mmol/L (ref 3.5–5.1)
Sodium: 140 mmol/L (ref 136–145)
Total Bilirubin: 0.5 mg/dL (ref 0.2–1.2)
Total Protein: 6.9 g/dL (ref 6.4–8.3)

## 2017-10-08 LAB — CBC WITH DIFFERENTIAL (CANCER CENTER ONLY)
Basophils Absolute: 0 10*3/uL (ref 0.0–0.1)
Basophils Relative: 1 %
Eosinophils Absolute: 0.3 10*3/uL (ref 0.0–0.5)
Eosinophils Relative: 8 %
HCT: 35.6 % (ref 34.8–46.6)
Hemoglobin: 11.9 g/dL (ref 11.6–15.9)
Lymphocytes Relative: 15 %
Lymphs Abs: 0.5 10*3/uL — ABNORMAL LOW (ref 0.9–3.3)
MCH: 32.1 pg (ref 25.1–34.0)
MCHC: 33.5 g/dL (ref 31.5–36.0)
MCV: 95.7 fL (ref 79.5–101.0)
Monocytes Absolute: 0.3 10*3/uL (ref 0.1–0.9)
Monocytes Relative: 9 %
Neutro Abs: 2.2 10*3/uL (ref 1.5–6.5)
Neutrophils Relative %: 67 %
Platelet Count: 159 10*3/uL (ref 145–400)
RBC: 3.72 MIL/uL (ref 3.70–5.45)
RDW: 13.5 % (ref 11.2–14.5)
WBC Count: 3.4 10*3/uL — ABNORMAL LOW (ref 3.9–10.3)

## 2017-10-08 MED ORDER — SODIUM CHLORIDE 0.9 % IV SOLN
14.8000 mg/kg | Freq: Once | INTRAVENOUS | Status: AC
  Administered 2017-10-08: 1100 mg via INTRAVENOUS
  Filled 2017-10-08: qty 32

## 2017-10-08 MED ORDER — HEPARIN SOD (PORK) LOCK FLUSH 100 UNIT/ML IV SOLN
500.0000 [IU] | Freq: Once | INTRAVENOUS | Status: AC | PRN
  Administered 2017-10-08: 500 [IU]
  Filled 2017-10-08: qty 5

## 2017-10-08 MED ORDER — SODIUM CHLORIDE 0.9% FLUSH
10.0000 mL | INTRAVENOUS | Status: DC | PRN
  Administered 2017-10-08: 10 mL
  Filled 2017-10-08: qty 10

## 2017-10-08 MED ORDER — SODIUM CHLORIDE 0.9 % IV SOLN
Freq: Once | INTRAVENOUS | Status: AC
  Administered 2017-10-08: 09:00:00 via INTRAVENOUS

## 2017-10-08 NOTE — Patient Instructions (Signed)
[facility] Discharge Instructions for Patients Receiving Chemotherapy  Today you received the following chemotherapy agents:  Avastin (bevacizumab)  To help prevent nausea and vomiting after your treatment, we encourage you to take your nausea medication as prescribed.   If you develop nausea and vomiting that is not controlled by your nausea medication, call the clinic.   BELOW ARE SYMPTOMS THAT SHOULD BE REPORTED IMMEDIATELY:  *FEVER GREATER THAN 100.5 F  *CHILLS WITH OR WITHOUT FEVER  NAUSEA AND VOMITING THAT IS NOT CONTROLLED WITH YOUR NAUSEA MEDICATION  *UNUSUAL SHORTNESS OF BREATH  *UNUSUAL BRUISING OR BLEEDING  TENDERNESS IN MOUTH AND THROAT WITH OR WITHOUT PRESENCE OF ULCERS  *URINARY PROBLEMS  *BOWEL PROBLEMS  UNUSUAL RASH Items with * indicate a potential emergency and should be followed up as soon as possible.  Feel free to call the clinic should you have any questions or concerns. The clinic phone number is [phone].  Please show the [facility] at check-in to the Emergency Department and triage nurse.

## 2017-10-15 ENCOUNTER — Ambulatory Visit (HOSPITAL_COMMUNITY)
Admission: RE | Admit: 2017-10-15 | Discharge: 2017-10-15 | Disposition: A | Discharge status: Home / Self Care | Payer: 59 | Source: Ambulatory Visit | Attending: Hematology and Oncology | Admitting: Hematology and Oncology

## 2017-10-15 DIAGNOSIS — C541 Malignant neoplasm of endometrium: Secondary | ICD-10-CM | POA: Insufficient documentation

## 2017-10-15 DIAGNOSIS — C771 Secondary and unspecified malignant neoplasm of intrathoracic lymph nodes: Secondary | ICD-10-CM | POA: Diagnosis not present

## 2017-10-15 DIAGNOSIS — E041 Nontoxic single thyroid nodule: Secondary | ICD-10-CM | POA: Diagnosis not present

## 2017-10-15 DIAGNOSIS — C770 Secondary and unspecified malignant neoplasm of lymph nodes of head, face and neck: Secondary | ICD-10-CM | POA: Insufficient documentation

## 2017-10-15 LAB — GLUCOSE, CAPILLARY: Glucose-Capillary: 102 mg/dL — ABNORMAL HIGH (ref 65–99)

## 2017-10-15 MED ORDER — FLUDEOXYGLUCOSE F - 18 (FDG) INJECTION: 7.7000 | Freq: Once | INTRAVENOUS | Status: DC | PRN

## 2017-10-16 ENCOUNTER — Telehealth: Payer: Self-pay | Admitting: Hematology and Oncology

## 2017-10-16 ENCOUNTER — Inpatient Hospital Stay (HOSPITAL_BASED_OUTPATIENT_CLINIC_OR_DEPARTMENT_OTHER): Payer: 59 | Admitting: Hematology and Oncology

## 2017-10-16 ENCOUNTER — Encounter: Payer: Self-pay | Admitting: Hematology and Oncology

## 2017-10-16 DIAGNOSIS — C541 Malignant neoplasm of endometrium: Secondary | ICD-10-CM | POA: Diagnosis not present

## 2017-10-16 DIAGNOSIS — Z90710 Acquired absence of both cervix and uterus: Secondary | ICD-10-CM | POA: Diagnosis not present

## 2017-10-16 DIAGNOSIS — I10 Essential (primary) hypertension: Secondary | ICD-10-CM

## 2017-10-16 DIAGNOSIS — Z90722 Acquired absence of ovaries, bilateral: Secondary | ICD-10-CM | POA: Diagnosis not present

## 2017-10-16 DIAGNOSIS — R946 Abnormal results of thyroid function studies: Secondary | ICD-10-CM

## 2017-10-16 DIAGNOSIS — Z5112 Encounter for antineoplastic immunotherapy: Secondary | ICD-10-CM | POA: Diagnosis not present

## 2017-10-16 DIAGNOSIS — Z9221 Personal history of antineoplastic chemotherapy: Secondary | ICD-10-CM | POA: Diagnosis not present

## 2017-10-16 DIAGNOSIS — Z923 Personal history of irradiation: Secondary | ICD-10-CM | POA: Diagnosis not present

## 2017-10-16 NOTE — Assessment & Plan Note (Signed)
I have reviewed her PET CT scan from 10/15/2017 She has positive response to treatment She tolerated treatment well without major side effects except for hypertension which is well controlled with current antihypertensives I recommend we continue treatment indefinitely if possible I plan to repeat imaging study again in the next 3 to 4 months, due around end of August 2019 

## 2017-10-16 NOTE — Telephone Encounter (Signed)
Gave pt avs and calendar with appts per 5/14 los.

## 2017-10-16 NOTE — Assessment & Plan Note (Signed)
She had numerous biopsies that were negative in the past She will continue close follow-up with endocrinologist. I do not recommend repeat thyroid biopsy right now

## 2017-10-16 NOTE — Assessment & Plan Note (Signed)
Her blood pressure is better controlled We will continue the same antihypertensives.  We will be checking total protein for proteinuria She is instructed to check her blood pressure regularly and call me if her blood pressure is poorly controlled again

## 2017-10-16 NOTE — Progress Notes (Signed)
[place] OFFICE PROGRESS NOTE  Patient Care Team: [name], MD as PCP - General (Family Medicine) Reynold Bowen, MD as Consulting Physician (Endocrinology)  ASSESSMENT & PLAN:  Endometrial cancer [facility]) I have reviewed her PET CT scan from 10/15/2017 She has positive response to treatment She tolerated treatment well without major side effects except for hypertension which is well controlled with current antihypertensives I recommend we continue treatment indefinitely if possible I plan to repeat imaging study again in the next 3 to 4 months, due around end of August 2019  Essential hypertension Her blood pressure is better controlled We will continue the same antihypertensives.  We will be checking total protein for proteinuria She is instructed to check her blood pressure regularly and call me if her blood pressure is poorly controlled again  Abnormal thyroid scan She had numerous biopsies that were negative in the past She will continue close follow-up with endocrinologist. I do not recommend repeat thyroid biopsy right now   No orders of the defined types were placed in this encounter.   INTERVAL HISTORY: Please see below for problem oriented charting. She returns to review test result She tolerated treatment well She denies abnormal vaginal bleeding, bloating or changes in bowel habits Her blood pressure is well controlled  SUMMARY OF ONCOLOGIC HISTORY: Oncology History   [employer] testing done 11-2015 on surgical path from 2014: MS stable TMB low 4 muts/mb ATM [id number] ERBB3 T389K E2H2 rearrangement exon 9 PPP2R1A P179R TP53 [id number]    ER- APPROXIMATELY 25-35% STAINING IN NEOPLASTIC CELLS (INTERMEDIATE)  PR- APPROXIMATELY 25-35% STAINING IN NEOPLASTIC CELLS (STRONG)  Repeat biopsy 04/02/17: ER negative, Her 2 negative     Endometrial cancer ([place])   06/20/2012 Pathology Results    Biopsy positive for papillary serous  carcinoma      06/20/2012 Genetic Testing    [employer] One testing done 11-2015 on surgical path from 2014: MS stable TMB low 4 muts/mb ATM [id number] ERBB3 T389K E2H2 rearrangement exon 9 PPP2R1A P179R TP53 [id number]       06/20/2012 Initial Diagnosis    Patient presented to PCP with intermittent vaginal bleeding since ~ Oct 2013, endometrial biopsy 06-20-12 with complex endometrial hyperplasia with atypia      06/26/2012 Imaging    Thickened endometrial lining in a postmenopausal patient experiencing vaginal bleeding. In the setting of post-menopausal bleeding, endometrial sampling is indicated to exclude carcinoma. No focal myometrial abnormalities are seen.  Normal left ovary and non-visualized right ovary      07/30/2012 Surgery    Dr. Polly Cobia performed robotic hysterectomy with bilateral salpingo-oophorectomy, bilateral pelvic lymph node dissection and periaortic lymph node dissection. Intraoperatively on frozen section, the patient was noted to have a large endometrial polyp with changes within the polyp consistent for high-grade malignancy, possibly papillary serous carcinoma. There is no obvious extrauterine disease noted.        07/30/2012 Pathology Results    [phone] SUPPLEMENTAL REPORT  THE ENDOMETRIAL CARCINOMA WAS ANALYZED FOR DNA MISMATCH REPAIR PROTEINS.IMMUNOHISTOCHEMICALLY, THE NEOPLASM RETAINED NUCLEAR EXPRESSION OF 4 GENE PRODUCTS, MLH1, MSH2, MSH6, AND PMS2, INVOLVED IN DNA MISMATCH REPAIR.POSITIVE AND NEGATIVE CONTROLS WORKED APPROPRIATELY.  PER REQUEST, AN ER AND PR ARE PERFORMED ON BLOCK 1I.  ER- APPROXIMATELY 25-35% STAINING IN NEOPLASTIC CELLS (INTERMEDIATE)  PR- APPROXIMATELY 25-35% STAINING IN NEOPLASTIC CELLS (STRONG)  ER AND PR PREDOMINANTLY SHOW STAINING IN THE SEROUS COMPONENT.  DIAGNOSIS  1. UTERUS, CERVIX WITH BILATERAL FALLOPIAN TUBES AND OVARIES,  HYSTERECTOMY AND BILATERAL SALPINGO-OOPHORECTOMY:  HIGH GRADE/POORLY  DIFFERENTIATED ENDOMETRIAL ADENOCARCINOMA WITH SOLID AND SEROUS PAPILLARY COMPONENTS.  HISTOPATHOLOGIC TYPE:A VARIETY OF PATTERNS WERE PRESENT, ALL OF WHICH SHOULD BE CONSIDERED TO BE HIGH GRADE.SEROUS PAPILLARY CARCINOMA WAS SEEN OCCURRING ADJACENT TO SOLID ENDOMETRIAL CARCINOMA WITH MARKED ANAPLASIA AND GIANT CELLS. SIZE:TUMOR MEASURED AT LEAST 4.8 CM IN GREATEST HORIZONTAL DIMENSION.  GRADE: POORLY DIFFERENTIATED OR HIGH GRADE. DEPTH OF INVASION: NO DEFINITE MYOMETRIAL INVOLVEMENT WAS SEEN.THE TUMOR APPEARED CONFINED TO THE POLYP AS WELL AS SURFACE ENDOMETRIUM. WHERE MYOMETRIAL THICKNESS NOT APPLICABLE. SEROSAL INVOLVEMENT: NOT DEMONSTRATED.  ENDOCERVICAL INVOLVEMENT:NOT DEMONSTRATED. RESECTION MARGINS: FREE OF INVOLVEMENT. EXTRAUTERINE EXTENSION:NOT DEMONSTRATED. ANGIOLYMPHATIC INVASION: NOT DEFINITELY SEEN. TOTAL NODES EXAMINED:22.  PELVIC NODES EXAMINED:20.  PELVIC NODES INVOLVED:0.  PARA-AORTIC NODES 2. EXAMINED:  PARA-AORTIC NODES 0. INVOLVED TNM STAGE: T1A N0 MX. AJCC STAGE GROUPING: IA. FIGO STAGE:IA.      08/26/2012 Imaging    No CT evidence for intra-abdominal or pelvic metastatic disease. Trace free pelvic fluid, presumably postoperative although the date of surgery is not documented in the electronic medical record.      08/26/2012 - 12/13/2012 Chemotherapy    She received 6 cycles of carbo/taxol       10/29/2012 - 11/27/2012 Radiation Therapy    May 27, June 5,  June 11, June 19, November 27, 2012: Proximal vagina 30 Gy in 5 fractions        01/17/2013 Imaging    1.  No evidence of recurrent or metastatic disease. 2.  No acute abnormality involving the abdomen or pelvis. 3.  Mild diffuse hepatic steatosis. 4.  Very small supraumbilical midline anterior abdominal wall hernia containing fat, unchanged      01/22/2014 Imaging    No evidence for metastatic or recurrent  disease. 2. No bowel obstruction.  Normal appendix. 3. Small fat containing hernia, stable in appearance. 4. Status post hysterectomy and bilateral oophorectomy      02/19/2014 Imaging    No pulmonary lesions are identified. 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 No other sites of metastatic disease identified within the abdomen or pelvis.        11/27/2014 - 02/11/2015 Chemotherapy    She received 4 cycles of carbo/taxol       03/01/2015 PET scan    Single hypermetabolic small retroperitoneal lymph node along the aorta. 2. No evidence of metastatic disease otherwise in the abdomen or pelvis. No evidence local recurrence. 3. Intensely hypermetabolic enlarged nodule adjacent to the RIGHT lobe of thyroid gland. This presumably represents the biopsied lesion in clinician report which was found to be benign thyroid tissue.      04/05/2015 - 05/13/2015 Radiation Therapy    She received 50.4 gray in 28 fractions with simultaneous integrated boost to 56 gray      06/17/2015 Imaging    No acute process or evidence of metastatic disease in the abdomen or pelvis. Resolution of previously described retroperitoneal adenopathy. 2.  Possible constipation. 3. Atherosclerosis.      06/17/2015 Tumor Marker    Patient's tumor was tested for the following markers: CA125 Results of the tumor marker test revealed 33      08/12/2015 Tumor Marker    Patient's tumor was tested for the following markers: CA125 Results of the tumor marker test revealed 52  08/31/2015 PET scan    Development of right paratracheal hypermetabolic adenopathy, consistent with nodal metastasis. 2. The previously described isolated abdominal retroperitoneal hypermetabolic node has resolved.  3. Persistent hypermetabolic right thyroid nodule, per report previously biopsied. Correlate with those results. 4.  Possible constipation.      09/09/2015 -  Anti-estrogen oral therapy    She has been receiving alternative treatment between megace and tamoxifen      09/09/2015 Tumor Marker    Patient's tumor was tested for the following markers: CA125 Results of the tumor marker test revealed 37.8      09/20/2015 Procedure    Technically successful ultrasound-guided thyroid aspiration biopsy , dominant right nodule      09/20/2015 Pathology Results    THYROID, RIGHT, FINE NEEDLE ASPIRATION (SPECIMEN 1 OF 1 COLLECTED 09/20/15): FINDINGS CONSISTENT WITH BENIGN THYROID NODULE (BETHESDA CATEGORY II).      10/28/2015 Tumor Marker    Patient's tumor was tested for the following markers: CA125 Results of the tumor marker test revealed 53.2      11/04/2015 Imaging    Stable benign right thyroid nodule      12/16/2015 Tumor Marker    Patient's tumor was tested for the following markers: CA125 Results of the tumor marker test revealed 38.1      03/22/2016 PET scan    Interval progression of hypermetabolic right paratracheal lymph node consistent with metastatic involvement. 2. Stable hypermetabolic right thyroid nodule. Reportedly this has been biopsied in the past.      03/23/2016 Tumor Marker    Patient's tumor was tested for the following markers: CA125 Results of the tumor marker test revealed 62.4      04/13/2016 - 06/01/2016 Radiation Therapy    She received 56 Gy to the chest in 28 fractions       05/09/2016 Imaging    No evidence of left lower extremity deep vein thrombosis. No evidence of a superficial thrombosis of the greater and lesser saphenous veins. Positive for thrombus noted in several varicosities of the calf. No evidence of Baker's cyst on the left.      05/17/2016 Tumor Marker    Patient's tumor was tested for the following markers: CA125 Results of the tumor  marker test revealed 9      06/29/2016 Tumor Marker    Patient's tumor was tested for the following markers: CA125 Results of the tumor marker test revealed 5.9      08/04/2016 Tumor Marker    Patient's tumor was tested for the following markers: CA125 Results of the tumor marker test revealed 6.0      09/12/2016 PET scan    Complete metabolic response to therapy, with resolution of hypermetabolic mediastinal lymphadenopathy since prior exam. No residual or new metastatic disease identified. Stable hypermetabolic right thyroid lobe nodule, which was previously biopsied on 09/20/2015.      03/13/2017 PET scan    1. Solitary focus of recurrent right paratracheal hypermetabolic activity, with a 1.0 cm right lower paratracheal node having a maximum SUV of 9.0. Appearance compatible with recurrent malignancy. 2. Continued hypermetabolic right thyroid nodule, previously biopsied, presumed benign -correlate with prior biopsy results. 3. Other imaging findings of potential clinical significance: Aortic Atherosclerosis (ICD10-I70.0). Mild cardiomegaly. Prominent stool throughout the colon favors constipation.      04/02/2017 Pathology Results    FINE NEEDLE ASPIRATION, ENDOSCOPIC, (EBUS) 4 R NODE (SPECIMEN 1 OF 2 COLLECTED 04/02/17): MALIGNANT CELLS PRESENT, CONSISTENT WITH CARCINOMA. SEE COMMENT. COMMENT: THE MALIGNANT CELLS ARE  POSITIVE FOR P53 AND NEGATIVE FOR ESTROGEN RECEPTOR AND TTF-1. THIS PROFILE IS NON-SPECIFIC, BUT THE P53 POSITIVE STAINING IS SUGGESTIVE OF GYNECOLOGIC PRIMARY.       04/11/2017 Procedure    Successful 8 French right internal jugular vein power port placement with its tip at the SVC/RA junction      04/12/2017 Imaging    Normal LV size with mild LV hypertrophy. EF 55-60%. Normal RV size and systolic function. Aortic valve sclerosis without significant stenosis.      04/18/2017 - 06/14/2017 Chemotherapy    The patient had chemotherapy with Doxil       07/10/2017  Imaging    - Left ventricle: The cavity size was normal. There was mild concentric hypertrophy. Systolic function was normal. The estimated ejection fraction was in the range of 55% to 60%. Wall motion was normal; there were no regional wall motion abnormalities. Doppler parameters are consistent with abnormal left ventricular relaxation (grade 1 diastolic dysfunction). - Aortic valve: Noncoronary cusp mobility was mildly restricted. - Mitral valve: There was mild regurgitation. - Left atrium: The atrium was mildly dilated. - Atrial septum: No defect or patent foramen ovale was identified.  Impressions:  - Compared to November 2018, global LV longitudinal strain remains normal (has increased)      07/11/2017 PET scan    Increased size and hypermetabolic activity of 3.3 cm right thyroid lobe nodule. Thyroid carcinoma cannot be excluded. Recommend repeat ultrasound guided fine needle aspiration to exclude thyroid carcinoma.  New adjacent hypermetabolic 10 mm right supraclavicular lymph node, suspicious for lymph node metastasis.  Slight increase in size and hypermetabolic activity of solitary right paratracheal lymph node.  No evidence of abdominal or pelvic metastatic disease.      07/18/2017 Procedure    1. Technically successful ultrasound guided fine needle aspiration of indeterminate hypermetabolic right-sided thyroid nodule/mass. 2. Technically successful ultrasound-guided core needle biopsy of hypermetabolic right lower cervical lymph node.      07/18/2017 Pathology Results    THYROID, FINE NEEDLE ASPIRATION RIGHT (SPECIMEN 1 OF 1, COLLECTED ON 07/18/2017): ATYPIA OF UNDETERMINED SIGNIFICANCE OR FOLLICULAR LESION OF UNDETERMINED SIGNIFICANCE (BETHESDA CATEGORY III). SEE COMMENT. COMMENT: THE SPECIMEN CONSISTS OF SMALL AND MEDIUM SIZED GROUPS OF FOLLICULAR EPITHELIAL CELLS WITH MILD CYTOLOGIC ATYPIA INCLUDING NUCLEAR ENLARGEMENT AND HURTHLE CELL CHANGE. SOME GROUPS ARE ARRANGED AS  MICROFOLLICLES. THERE IS MINIMAL BACKGROUND COLLOID. BASED ON THESE FEATURES, A FOLLICULAR LESION/NEOPLASM CAN NOT BE ENTIRELY RULED OUT. A SPECIMEN WILL BE SENT FOR AFIRMA TESTING.      07/19/2017 Pathology Results    Lymph node, needle/core biopsy - METASTATIC PAPILLARY SEROUS CARCINOMA. - SEE COMMENT. Microscopic Comment Dr. Vicente Males has reviewed the case and concurs with this interpretation      10/15/2017 PET scan    No new or progressive disease. No evidence of abdominal or pelvic metastatic disease.  Stable hypermetabolic right thyroid lobe nodule and adjacent right supraclavicular lymph node.  Decreased size and hypermetabolic activity of solitary right paratracheal lymph node.       REVIEW OF SYSTEMS:   Constitutional: Denies fevers, chills or abnormal weight loss Eyes: Denies blurriness of vision Ears, nose, mouth, throat, and face: Denies mucositis or sore throat Respiratory: Denies cough, dyspnea or wheezes Cardiovascular: Denies palpitation, chest discomfort or lower extremity swelling Gastrointestinal:  Denies nausea, heartburn or change in bowel habits Skin: Denies abnormal skin rashes Lymphatics: Denies new lymphadenopathy or easy bruising Neurological:Denies numbness, tingling or new weaknesses Behavioral/Psych: Mood is stable, no new changes  All other systems were reviewed with the patient and are negative.  I have reviewed the past medical history, past surgical history, social history and family history with the patient and they are unchanged from previous note.  ALLERGIES:  has No Known Allergies.  MEDICATIONS:  Current Outpatient Medications  Medication Sig Dispense Refill  . amLODipine (NORVASC) 10 MG tablet Take 1 tablet (10 mg total) by mouth daily. 90 tablet 0  . Calcium Carb-Cholecalciferol 500-600 MG-UNIT TABS Take 1 tablet by mouth daily.     . Cholecalciferol (VITAMIN D3) 2000 units TABS Take 2,000 Units by mouth daily.     . metoprolol  tartrate (LOPRESSOR) 50 MG tablet Take 1 tablet (50 mg total) by mouth 2 (two) times daily. 60 tablet 1  . simvastatin (ZOCOR) 40 MG tablet Take 40 mg by mouth every evening.    . tacrolimus (PROTOPIC) 0.1 % ointment Apply 1 application topically daily as needed (rash).     . venlafaxine XR (EFFEXOR-XR) 75 MG 24 hr capsule Take 75 mg by mouth daily with breakfast.      No current facility-administered medications for this visit.    Facility-Administered Medications Ordered in Other Visits  Medication Dose Route Frequency Provider Last Rate Last Dose  . fludeoxyglucose F - 18 (FDG) injection 7.7 millicurie  7.7 millicurie Intravenous Once PRN Lorriane Shire, MD        PHYSICAL EXAMINATION: ECOG PERFORMANCE STATUS: 0 - Asymptomatic  Vitals:   10/16/17 1241  BP: 137/81  Pulse: 83  Resp: 16  Temp: 98 F (36.7 C)  SpO2: 99%   Filed Weights   10/16/17 1241  Weight: 158 lb 8 oz (71.9 kg)    GENERAL:alert, no distress and comfortable Musculoskeletal:no cyanosis of digits and no clubbing  NEURO: alert & oriented x 3 with fluent speech, no focal motor/sensory deficits  LABORATORY DATA:  I have reviewed the data as listed    Component Value Date/Time   NA 140 10/08/2017 0830   NA 141 05/17/2017 0957   K 4.2 10/08/2017 0830   K 3.6 05/17/2017 0957   CL 107 10/08/2017 0830   CL 105 11/19/2012 0848   CO2 26 10/08/2017 0830   CO2 25 05/17/2017 0957   GLUCOSE 97 10/08/2017 0830   GLUCOSE 85 05/17/2017 0957   GLUCOSE 122 (H) 11/19/2012 0848   BUN 18 10/08/2017 0830   BUN 15.2 05/17/2017 0957   CREATININE 0.79 10/08/2017 0830   CREATININE 0.8 05/17/2017 0957   CALCIUM 10.0 10/08/2017 0830   CALCIUM 9.6 05/17/2017 0957   PROT 6.9 10/08/2017 0830   PROT 7.0 05/17/2017 0957   ALBUMIN 3.8 10/08/2017 0830   ALBUMIN 3.9 05/17/2017 0957   AST 21 10/08/2017 0830   AST 25 05/17/2017 0957   ALT 16 10/08/2017 0830   ALT 18 05/17/2017 0957   ALKPHOS 57 10/08/2017 0830   ALKPHOS 57  05/17/2017 0957   BILITOT 0.5 10/08/2017 0830   BILITOT 0.47 05/17/2017 0957   GFRNONAA >60 10/08/2017 0830   GFRAA >60 10/08/2017 0830    No results found for: SPEP, UPEP  Lab Results  Component Value Date   WBC 3.4 (L) 10/08/2017   NEUTROABS 2.2 10/08/2017   HGB 11.9 10/08/2017   HCT 35.6 10/08/2017   MCV 95.7 10/08/2017   PLT 159 10/08/2017      Chemistry      Component Value Date/Time   NA 140 10/08/2017 0830   NA 141 05/17/2017 0957   K 4.2 10/08/2017  0830   K 3.6 05/17/2017 0957   CL 107 10/08/2017 0830   CL 105 11/19/2012 0848   CO2 26 10/08/2017 0830   CO2 25 05/17/2017 0957   BUN 18 10/08/2017 0830   BUN 15.2 05/17/2017 0957   CREATININE 0.79 10/08/2017 0830   CREATININE 0.8 05/17/2017 0957   GLU 158 (H) 01/14/2015 1035      Component Value Date/Time   CALCIUM 10.0 10/08/2017 0830   CALCIUM 9.6 05/17/2017 0957   ALKPHOS 57 10/08/2017 0830   ALKPHOS 57 05/17/2017 0957   AST 21 10/08/2017 0830   AST 25 05/17/2017 0957   ALT 16 10/08/2017 0830   ALT 18 05/17/2017 0957   BILITOT 0.5 10/08/2017 0830   BILITOT 0.47 05/17/2017 0957       RADIOGRAPHIC STUDIES: I have reviewed multiple imaging studies with the patient extensively I have personally reviewed the radiological images as listed and agreed with the findings in the report. Nm Pet Image Restag (ps) Skull Base To Thigh  Result Date: 10/15/2017 CLINICAL DATA:  Subsequent treatment strategy for endometrial carcinoma. EXAM: NUCLEAR MEDICINE PET SKULL BASE TO THIGH TECHNIQUE: 7.7 mCi F-18 FDG was injected intravenously. Full-ring PET imaging was performed from the skull base to thigh after the radiotracer. CT data was obtained and used for attenuation correction and anatomic localization. Fasting blood glucose: 102 mg/dl COMPARISON:  07/11/2017 FINDINGS: (Mediastinal blood pool activity: SUV max = 2.1) NECK: 3.1 cm right thyroid lobe nodule is stable in size. This is hypermetabolic, with SUV max 76.1  compared to 9.3 previously. Adjacent 10 mm right supraclavicular lymph node on image 47/4 is unchanged in size, and has SUV max of 5.0 compared to 5.7 previously. No other hypermetabolic cervical lymph nodes are identified. Incidental CT findings:  None. CHEST: 11 mm hypermetabolic right paratracheal lymph node on image 58/4 has decreased in size compared to 15 mm previously. This shows decreased hypermetabolic activity, with SUV max of 5.2 compared to 9.7 previously. No other hypermetabolic lymph nodes identified. No suspicious pulmonary nodules seen on CT images. Incidental CT findings: Scarring in the central right upper lobe remains stable. ABDOMEN/PELVIS: No abnormal hypermetabolic activity within the liver, pancreas, adrenal glands, or spleen. No hypermetabolic lymph nodes in the abdomen or pelvis. Incidental CT findings: Aortic atherosclerosis. Prior hysterectomy. SKELETON: No focal hypermetabolic bone lesions to suggest skeletal metastasis. Incidental CT findings:  None. IMPRESSION: No new or progressive disease. No evidence of abdominal or pelvic metastatic disease. Stable hypermetabolic right thyroid lobe nodule and adjacent right supraclavicular lymph node. Decreased size and hypermetabolic activity of solitary right paratracheal lymph node. Electronically Signed   By: Earle Gell M.D.   On: 10/15/2017 09:11    All questions were answered. The patient knows to call the clinic with any problems, questions or concerns. No barriers to learning was detected.  I spent 15 minutes counseling the patient face to face. The total time spent in the appointment was 20 minutes and more than 50% was on counseling and review of test results  [name], MD 10/16/2017 2:06 PM

## 2017-10-18 MED FILL — METOPROLOL TARTRATE 50 MG T: 50 | 30 days supply | Qty: 60 | Fill #1

## 2017-11-02 ENCOUNTER — Inpatient Hospital Stay: Payer: 59

## 2017-11-02 VITALS — BP 116/72 | HR 56 | Temp 98.5°F | Resp 16

## 2017-11-02 DIAGNOSIS — C770 Secondary and unspecified malignant neoplasm of lymph nodes of head, face and neck: Secondary | ICD-10-CM

## 2017-11-02 DIAGNOSIS — C541 Malignant neoplasm of endometrium: Secondary | ICD-10-CM

## 2017-11-02 DIAGNOSIS — Z95828 Presence of other vascular implants and grafts: Secondary | ICD-10-CM

## 2017-11-02 DIAGNOSIS — Z923 Personal history of irradiation: Secondary | ICD-10-CM | POA: Diagnosis not present

## 2017-11-02 DIAGNOSIS — Z7189 Other specified counseling: Secondary | ICD-10-CM

## 2017-11-02 DIAGNOSIS — Z9221 Personal history of antineoplastic chemotherapy: Secondary | ICD-10-CM | POA: Diagnosis not present

## 2017-11-02 DIAGNOSIS — Z90722 Acquired absence of ovaries, bilateral: Secondary | ICD-10-CM | POA: Diagnosis not present

## 2017-11-02 DIAGNOSIS — Z90710 Acquired absence of both cervix and uterus: Secondary | ICD-10-CM | POA: Diagnosis not present

## 2017-11-02 DIAGNOSIS — I10 Essential (primary) hypertension: Secondary | ICD-10-CM | POA: Diagnosis not present

## 2017-11-02 DIAGNOSIS — R946 Abnormal results of thyroid function studies: Secondary | ICD-10-CM | POA: Diagnosis not present

## 2017-11-02 DIAGNOSIS — Z5112 Encounter for antineoplastic immunotherapy: Secondary | ICD-10-CM | POA: Diagnosis not present

## 2017-11-02 LAB — CBC WITH DIFFERENTIAL (CANCER CENTER ONLY)
Basophils Absolute: 0 10*3/uL (ref 0.0–0.1)
Basophils Relative: 1 %
Eosinophils Absolute: 0.2 10*3/uL (ref 0.0–0.5)
Eosinophils Relative: 8 %
HCT: 34.2 % — ABNORMAL LOW (ref 34.8–46.6)
Hemoglobin: 11.8 g/dL (ref 11.6–15.9)
Lymphocytes Relative: 17 %
Lymphs Abs: 0.5 10*3/uL — ABNORMAL LOW (ref 0.9–3.3)
MCH: 32.6 pg (ref 25.1–34.0)
MCHC: 34.4 g/dL (ref 31.5–36.0)
MCV: 94.6 fL (ref 79.5–101.0)
Monocytes Absolute: 0.2 10*3/uL (ref 0.1–0.9)
Monocytes Relative: 7 %
Neutro Abs: 2 10*3/uL (ref 1.5–6.5)
Neutrophils Relative %: 67 %
Platelet Count: 163 10*3/uL (ref 145–400)
RBC: 3.61 MIL/uL — ABNORMAL LOW (ref 3.70–5.45)
RDW: 13.6 % (ref 11.2–14.5)
WBC Count: 3 10*3/uL — ABNORMAL LOW (ref 3.9–10.3)

## 2017-11-02 LAB — COMPREHENSIVE METABOLIC PANEL
ALT: 24 U/L (ref 0–55)
AST: 25 U/L (ref 5–34)
Albumin: 3.9 g/dL (ref 3.5–5.0)
Alkaline Phosphatase: 49 U/L (ref 40–150)
Anion gap: 9 (ref 3–11)
BUN: 18 mg/dL (ref 7–26)
CO2: 27 mmol/L (ref 22–29)
Calcium: 9.8 mg/dL (ref 8.4–10.4)
Chloride: 104 mmol/L (ref 98–109)
Creatinine, Ser: 0.77 mg/dL (ref 0.60–1.10)
GFR calc Af Amer: >60 mL/min (ref >60)
GFR calc non Af Amer: >60 mL/min (ref >60)
Glucose, Bld: 94 mg/dL (ref 70–140)
Potassium: 3.9 mmol/L (ref 3.5–5.1)
Sodium: 140 mmol/L (ref 136–145)
Total Bilirubin: 0.4 mg/dL (ref 0.2–1.2)
Total Protein: 6.8 g/dL (ref 6.4–8.3)

## 2017-11-02 LAB — TOTAL PROTEIN, URINE DIPSTICK: Protein, ur: <30 mg/dL — AB

## 2017-11-02 MED ORDER — SODIUM CHLORIDE 0.9 % IV SOLN
1100.0000 mg | Freq: Once | INTRAVENOUS | Status: AC
  Administered 2017-11-02: 1100 mg via INTRAVENOUS
  Filled 2017-11-02: qty 32

## 2017-11-02 MED ORDER — SODIUM CHLORIDE 0.9% FLUSH
10.0000 mL | Freq: Once | INTRAVENOUS | Status: AC
  Administered 2017-11-02: 10 mL
  Filled 2017-11-02: qty 10

## 2017-11-02 MED ORDER — SODIUM CHLORIDE 0.9% FLUSH
10.0000 mL | INTRAVENOUS | Status: DC | PRN
  Administered 2017-11-02: 10 mL
  Filled 2017-11-02: qty 10

## 2017-11-02 MED ORDER — HEPARIN SOD (PORK) LOCK FLUSH 100 UNIT/ML IV SOLN
500.0000 [IU] | Freq: Once | INTRAVENOUS | Status: AC | PRN
  Administered 2017-11-02: 500 [IU]
  Filled 2017-11-02: qty 5

## 2017-11-02 MED ORDER — SODIUM CHLORIDE 0.9 % IV SOLN
Freq: Once | INTRAVENOUS | Status: AC
Start: 2017-11-02 — End: 2017-11-02
  Administered 2017-11-02: 10:00:00 via INTRAVENOUS

## 2017-11-02 NOTE — Patient Instructions (Signed)
[facility] Discharge Instructions for Patients Receiving Chemotherapy  Today you received the following chemotherapy agents Avastin  To help prevent nausea and vomiting after your treatment, we encourage you to take your nausea medication as directed   If you develop nausea and vomiting that is not controlled by your nausea medication, call the clinic.   BELOW ARE SYMPTOMS THAT SHOULD BE REPORTED IMMEDIATELY:  *FEVER GREATER THAN 100.5 F  *CHILLS WITH OR WITHOUT FEVER  NAUSEA AND VOMITING THAT IS NOT CONTROLLED WITH YOUR NAUSEA MEDICATION  *UNUSUAL SHORTNESS OF BREATH  *UNUSUAL BRUISING OR BLEEDING  TENDERNESS IN MOUTH AND THROAT WITH OR WITHOUT PRESENCE OF ULCERS  *URINARY PROBLEMS  *BOWEL PROBLEMS  UNUSUAL RASH Items with * indicate a potential emergency and should be followed up as soon as possible.  Feel free to call the clinic should you have any questions or concerns. The clinic phone number is [phone].  Please show the CHEMO ALERT CARD at check-in to the Emergency Department and triage nurse.   

## 2017-11-19 ENCOUNTER — Other Ambulatory Visit: Payer: Self-pay | Admitting: Hematology and Oncology

## 2017-11-19 MED FILL — METOPROLOL TARTRATE 50 MG T: 50 | 30 days supply | Qty: 60 | Fill #0

## 2017-11-23 ENCOUNTER — Telehealth: Payer: Self-pay | Admitting: Hematology and Oncology

## 2017-11-23 ENCOUNTER — Inpatient Hospital Stay: Payer: 59

## 2017-11-23 ENCOUNTER — Encounter: Payer: Self-pay | Admitting: Hematology and Oncology

## 2017-11-23 ENCOUNTER — Inpatient Hospital Stay: Payer: 59 | Attending: Hematology and Oncology | Admitting: Hematology and Oncology

## 2017-11-23 DIAGNOSIS — C770 Secondary and unspecified malignant neoplasm of lymph nodes of head, face and neck: Secondary | ICD-10-CM

## 2017-11-23 DIAGNOSIS — C786 Secondary malignant neoplasm of retroperitoneum and peritoneum: Secondary | ICD-10-CM | POA: Diagnosis not present

## 2017-11-23 DIAGNOSIS — D701 Agranulocytosis secondary to cancer chemotherapy: Secondary | ICD-10-CM | POA: Diagnosis not present

## 2017-11-23 DIAGNOSIS — L309 Dermatitis, unspecified: Secondary | ICD-10-CM

## 2017-11-23 DIAGNOSIS — Z923 Personal history of irradiation: Secondary | ICD-10-CM | POA: Diagnosis not present

## 2017-11-23 DIAGNOSIS — Z9221 Personal history of antineoplastic chemotherapy: Secondary | ICD-10-CM

## 2017-11-23 DIAGNOSIS — T451X5S Adverse effect of antineoplastic and immunosuppressive drugs, sequela: Secondary | ICD-10-CM

## 2017-11-23 DIAGNOSIS — C541 Malignant neoplasm of endometrium: Secondary | ICD-10-CM

## 2017-11-23 DIAGNOSIS — Z79899 Other long term (current) drug therapy: Secondary | ICD-10-CM

## 2017-11-23 DIAGNOSIS — Z5112 Encounter for antineoplastic immunotherapy: Secondary | ICD-10-CM | POA: Insufficient documentation

## 2017-11-23 DIAGNOSIS — Z7189 Other specified counseling: Secondary | ICD-10-CM

## 2017-11-23 DIAGNOSIS — I10 Essential (primary) hypertension: Secondary | ICD-10-CM

## 2017-11-23 DIAGNOSIS — T451X5A Adverse effect of antineoplastic and immunosuppressive drugs, initial encounter: Secondary | ICD-10-CM

## 2017-11-23 DIAGNOSIS — Z95828 Presence of other vascular implants and grafts: Secondary | ICD-10-CM

## 2017-11-23 LAB — COMPREHENSIVE METABOLIC PANEL
ALT: 15 U/L (ref 0–55)
AST: 20 U/L (ref 5–34)
Albumin: 3.9 g/dL (ref 3.5–5.0)
Alkaline Phosphatase: 54 U/L (ref 40–150)
Anion gap: 9 (ref 3–11)
BUN: 22 mg/dL (ref 7–26)
CO2: 27 mmol/L (ref 22–29)
Calcium: 10.1 mg/dL (ref 8.4–10.4)
Chloride: 106 mmol/L (ref 98–109)
Creatinine, Ser: 0.83 mg/dL (ref 0.60–1.10)
GFR calc Af Amer: >60 mL/min (ref >60)
GFR calc non Af Amer: >60 mL/min (ref >60)
Glucose, Bld: 91 mg/dL (ref 70–140)
Potassium: 4 mmol/L (ref 3.5–5.1)
Sodium: 142 mmol/L (ref 136–145)
Total Bilirubin: 0.4 mg/dL (ref 0.2–1.2)
Total Protein: 7 g/dL (ref 6.4–8.3)

## 2017-11-23 LAB — CBC WITH DIFFERENTIAL (CANCER CENTER ONLY)
Basophils Absolute: 0 10*3/uL (ref 0.0–0.1)
Basophils Relative: 1 %
Eosinophils Absolute: 0.2 10*3/uL (ref 0.0–0.5)
Eosinophils Relative: 6 %
HCT: 35.5 % (ref 34.8–46.6)
Hemoglobin: 12 g/dL (ref 11.6–15.9)
Lymphocytes Relative: 16 %
Lymphs Abs: 0.5 10*3/uL — ABNORMAL LOW (ref 0.9–3.3)
MCH: 32.4 pg (ref 25.1–34.0)
MCHC: 33.8 g/dL (ref 31.5–36.0)
MCV: 95.7 fL (ref 79.5–101.0)
Monocytes Absolute: 0.2 10*3/uL (ref 0.1–0.9)
Monocytes Relative: 8 %
Neutro Abs: 2 10*3/uL (ref 1.5–6.5)
Neutrophils Relative %: 69 %
Platelet Count: 170 10*3/uL (ref 145–400)
RBC: 3.71 MIL/uL (ref 3.70–5.45)
RDW: 13.3 % (ref 11.2–14.5)
WBC Count: 3 10*3/uL — ABNORMAL LOW (ref 3.9–10.3)

## 2017-11-23 LAB — TOTAL PROTEIN, URINE DIPSTICK: Protein, ur: NEGATIVE mg/dL

## 2017-11-23 MED ORDER — SODIUM CHLORIDE 0.9% FLUSH
10.0000 mL | INTRAVENOUS | Status: DC | PRN
  Administered 2017-11-23: 10 mL
  Filled 2017-11-23: qty 10

## 2017-11-23 MED ORDER — SODIUM CHLORIDE 0.9 % IV SOLN
14.8000 mg/kg | Freq: Once | INTRAVENOUS | Status: AC
  Administered 2017-11-23: 1100 mg via INTRAVENOUS
  Filled 2017-11-23: qty 32

## 2017-11-23 MED ORDER — HEPARIN SOD (PORK) LOCK FLUSH 100 UNIT/ML IV SOLN
500.0000 [IU] | Freq: Once | INTRAVENOUS | Status: AC | PRN
  Administered 2017-11-23: 500 [IU]
  Filled 2017-11-23: qty 5

## 2017-11-23 MED ORDER — SODIUM CHLORIDE 0.9 % IV SOLN
Freq: Once | INTRAVENOUS | Status: AC
  Administered 2017-11-23: 10:00:00 via INTRAVENOUS

## 2017-11-23 MED ORDER — SODIUM CHLORIDE 0.9% FLUSH
10.0000 mL | Freq: Once | INTRAVENOUS | Status: AC
  Administered 2017-11-23: 10 mL
  Filled 2017-11-23: qty 10

## 2017-11-23 NOTE — Patient Instructions (Signed)
[facility] Discharge Instructions for Patients Receiving Chemotherapy  Today you received the following chemotherapy agents Avastin  To help prevent nausea and vomiting after your treatment, we encourage you to take your nausea medication as directed   If you develop nausea and vomiting that is not controlled by your nausea medication, call the clinic.   BELOW ARE SYMPTOMS THAT SHOULD BE REPORTED IMMEDIATELY:  *FEVER GREATER THAN 100.5 F  *CHILLS WITH OR WITHOUT FEVER  NAUSEA AND VOMITING THAT IS NOT CONTROLLED WITH YOUR NAUSEA MEDICATION  *UNUSUAL SHORTNESS OF BREATH  *UNUSUAL BRUISING OR BLEEDING  TENDERNESS IN MOUTH AND THROAT WITH OR WITHOUT PRESENCE OF ULCERS  *URINARY PROBLEMS  *BOWEL PROBLEMS  UNUSUAL RASH Items with * indicate a potential emergency and should be followed up as soon as possible.  Feel free to call the clinic should you have any questions or concerns. The clinic phone number is [phone].  Please show the CHEMO ALERT CARD at check-in to the Emergency Department and triage nurse.   

## 2017-11-23 NOTE — Assessment & Plan Note (Signed)
She has no palpable lymphadenopathy on exam We will repeat imaging study as above

## 2017-11-23 NOTE — Assessment & Plan Note (Signed)
She is currently being evaluated by dermatologist for dermatitis It is not severe I would defer to them for further management.

## 2017-11-23 NOTE — Assessment & Plan Note (Signed)
Her blood pressure is better controlled We will continue the same antihypertensives.   She is instructed to check her blood pressure regularly and call me if her blood pressure is poorly controlled again

## 2017-11-23 NOTE — Telephone Encounter (Signed)
Patient will receive updated copy of schedule in treatment area.  °

## 2017-11-23 NOTE — Assessment & Plan Note (Signed)
I have reviewed her PET CT scan from 10/15/2017 She has positive response to treatment She tolerated treatment well without major side effects except for hypertension which is well controlled with current antihypertensives I recommend we continue treatment indefinitely if possible I plan to repeat imaging study again in the next 3 to 4 months, due around end of August 2019

## 2017-11-23 NOTE — Progress Notes (Signed)
[facility] OFFICE PROGRESS NOTE  Patient Care Team: [name], MD as PCP - General (Family Medicine) Diane Bowen, MD as Consulting Physician (Endocrinology)  ASSESSMENT & PLAN:  Endometrial cancer [facility]) I have reviewed her PET CT scan from 10/15/2017 She has positive response to treatment She tolerated treatment well without major side effects except for hypertension which is well controlled with current antihypertensives I recommend we continue treatment indefinitely if possible I plan to repeat imaging study again in the next 3 to 4 months, due around end of August 2019  Metastasis to supraclavicular lymph node [facility]) She has no palpable lymphadenopathy on exam We will repeat imaging study as above  Leukopenia due to antineoplastic chemotherapy [facility]) She has chronic leukopenia Observe only She is not symptomatic  Essential hypertension Her blood pressure is better controlled We will continue the same antihypertensives.   She is instructed to check her blood pressure regularly and call me if her blood pressure is poorly controlled again  Dermatitis She is currently being evaluated by dermatologist for dermatitis It is not severe I would defer to them for further management.   No orders of the defined types were placed in this encounter.   INTERVAL HISTORY: Please see below for problem oriented charting. She returns for further chemotherapy and follow-up She is currently being evaluated by dermatologist for dermatitis affecting skin She denies recent headache, blurriness of vision or leg swelling No new lymphadenopathy Denies recent infection  SUMMARY OF ONCOLOGIC HISTORY: [facility] One testing done 11-2015 on surgical path from 2014: MS stable TMB low 4 muts/mb ATM [id number] ERBB3 T389K E2H2 rearrangement exon 9 PPP2R1A P179R TP53 [id number]    ER- APPROXIMATELY 25-35% STAINING IN NEOPLASTIC CELLS (INTERMEDIATE)   PR- APPROXIMATELY 25-35% STAINING IN NEOPLASTIC CELLS (STRONG)  Repeat biopsy 04/02/17: ER negative, Her 2 negative     Endometrial cancer (Diane Lozano)   06/20/2012 Pathology Results    Biopsy positive for papillary serous carcinoma      06/20/2012 Genetic Testing    [facility] One testing done 11-2015 on surgical path from 2014: MS stable TMB low 4 muts/mb ATM [id number] ERBB3 T389K E2H2 rearrangement exon 9 PPP2R1A P179R TP53 I195N       06/20/2012 Initial Diagnosis    Patient presented to PCP with intermittent vaginal bleeding since ~ Oct 2013, endometrial biopsy 06-20-12 with complex endometrial hyperplasia with atypia      06/26/2012 Imaging    Thickened endometrial lining in a postmenopausal patient experiencing vaginal bleeding. In the setting of post-menopausal bleeding, endometrial sampling is indicated to exclude carcinoma. No focal myometrial abnormalities are seen.  Normal left ovary and non-visualized right ovary      07/30/2012 Surgery    Dr. Polly Cobia performed robotic hysterectomy with bilateral salpingo-oophorectomy, bilateral pelvic lymph node dissection and periaortic lymph node dissection. Intraoperatively on frozen section, the patient was noted to have a large endometrial polyp with changes within the polyp consistent for high-grade malignancy, possibly papillary serous carcinoma. There is no obvious extrauterine disease noted.        07/30/2012 Pathology Results    [phone] SUPPLEMENTAL REPORT  THE ENDOMETRIAL CARCINOMA WAS ANALYZED FOR DNA MISMATCH REPAIR PROTEINS.IMMUNOHISTOCHEMICALLY, THE NEOPLASM RETAINED NUCLEAR EXPRESSION OF 4 GENE PRODUCTS, MLH1, MSH2, MSH6, AND PMS2, INVOLVED IN DNA MISMATCH REPAIR.POSITIVE AND NEGATIVE CONTROLS WORKED APPROPRIATELY.  PER REQUEST, AN ER AND PR ARE PERFORMED ON BLOCK 1I.  ER- APPROXIMATELY 25-35% STAINING IN NEOPLASTIC CELLS (INTERMEDIATE)  PR- APPROXIMATELY 25-35% STAINING IN NEOPLASTIC  CELLS (STRONG)   ER AND PR PREDOMINANTLY SHOW STAINING IN THE SEROUS COMPONENT.  DIAGNOSIS  1. UTERUS, CERVIX WITH BILATERAL FALLOPIAN TUBES AND OVARIES,  HYSTERECTOMY AND BILATERAL SALPINGO-OOPHORECTOMY:  HIGH GRADE/POORLY DIFFERENTIATED ENDOMETRIAL ADENOCARCINOMA WITH SOLID AND SEROUS PAPILLARY COMPONENTS.  HISTOPATHOLOGIC TYPE:A VARIETY OF PATTERNS WERE PRESENT, ALL OF WHICH SHOULD BE CONSIDERED TO BE HIGH GRADE.SEROUS PAPILLARY CARCINOMA WAS SEEN OCCURRING ADJACENT TO SOLID ENDOMETRIAL CARCINOMA WITH MARKED ANAPLASIA AND GIANT CELLS. SIZE:TUMOR MEASURED AT LEAST 4.8 CM IN GREATEST HORIZONTAL DIMENSION.  GRADE: POORLY DIFFERENTIATED OR HIGH GRADE. DEPTH OF INVASION: NO DEFINITE MYOMETRIAL INVOLVEMENT WAS SEEN.THE TUMOR APPEARED CONFINED TO THE POLYP AS WELL AS SURFACE ENDOMETRIUM. WHERE MYOMETRIAL THICKNESS NOT APPLICABLE. SEROSAL INVOLVEMENT: NOT DEMONSTRATED.  ENDOCERVICAL INVOLVEMENT:NOT DEMONSTRATED. RESECTION MARGINS: FREE OF INVOLVEMENT. EXTRAUTERINE EXTENSION:NOT DEMONSTRATED. ANGIOLYMPHATIC INVASION: NOT DEFINITELY SEEN. TOTAL NODES EXAMINED:22.  PELVIC NODES EXAMINED:20.  PELVIC NODES INVOLVED:0.  PARA-AORTIC NODES 2. EXAMINED:  PARA-AORTIC NODES 0. INVOLVED TNM STAGE: T1A N0 MX. AJCC STAGE GROUPING: IA. FIGO STAGE:IA.      08/26/2012 Imaging    No CT evidence for intra-abdominal or pelvic metastatic disease. Trace free pelvic fluid, presumably postoperative although the date of surgery is not documented in the electronic medical record.      08/26/2012 - 12/13/2012 Chemotherapy    She received 6 cycles of carbo/taxol       10/29/2012 - 11/27/2012 Radiation Therapy    May 27, June 5,  June 11, June 19, November 27, 2012: Proximal vagina 30 Gy in 5 fractions        01/17/2013 Imaging    1.  No evidence of recurrent or metastatic disease. 2.  No acute abnormality involving the abdomen or  pelvis. 3.  Mild diffuse hepatic steatosis. 4.  Very small supraumbilical midline anterior abdominal wall hernia containing fat, unchanged      01/22/2014 Imaging    No evidence for metastatic or recurrent disease. 2. No bowel obstruction.  Normal appendix. 3. Small fat containing hernia, stable in appearance. 4. Status post hysterectomy and bilateral oophorectomy      02/19/2014 Imaging    No pulmonary lesions are identified. 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 No other sites of metastatic disease identified within the abdomen or pelvis.        11/27/2014 - 02/11/2015 Chemotherapy    She received 4 cycles of carbo/taxol       03/01/2015 PET scan    Single hypermetabolic small retroperitoneal lymph node along the aorta. 2. No evidence of metastatic disease otherwise in the abdomen or pelvis. No evidence local recurrence. 3. Intensely hypermetabolic enlarged nodule adjacent to the RIGHT lobe of thyroid gland. This presumably represents the biopsied lesion in clinician report which was found to be benign thyroid tissue.      04/05/2015 - 05/13/2015 Radiation Therapy    She received 50.4 gray in 28 fractions with simultaneous integrated boost to 56 gray      06/17/2015 Imaging    No acute process or evidence of metastatic disease in the abdomen or pelvis. Resolution of previously described retroperitoneal adenopathy. 2.  Possible constipation. 3. Atherosclerosis.      06/17/2015 Tumor Marker    Patient's tumor was tested for the following markers: CA125 Results of the tumor marker test revealed 33  08/12/2015 Tumor Marker    Patient's tumor was tested for the following markers: CA125 Results of the tumor marker test revealed 52       08/31/2015 PET scan    Development of right paratracheal hypermetabolic adenopathy, consistent with nodal metastasis. 2. The previously described isolated abdominal retroperitoneal hypermetabolic node has resolved. 3. Persistent hypermetabolic right thyroid nodule, per report previously biopsied. Correlate with those results. 4.  Possible constipation.      09/09/2015 -  Anti-estrogen oral therapy    She has been receiving alternative treatment between megace and tamoxifen      09/09/2015 Tumor Marker    Patient's tumor was tested for the following markers: CA125 Results of the tumor marker test revealed 37.8      09/20/2015 Procedure    Technically successful ultrasound-guided thyroid aspiration biopsy , dominant right nodule      09/20/2015 Pathology Results    THYROID, RIGHT, FINE NEEDLE ASPIRATION (SPECIMEN 1 OF 1 COLLECTED 09/20/15): FINDINGS CONSISTENT WITH BENIGN THYROID NODULE (BETHESDA CATEGORY II).      10/28/2015 Tumor Marker    Patient's tumor was tested for the following markers: CA125 Results of the tumor marker test revealed 53.2      11/04/2015 Imaging    Stable benign right thyroid nodule      12/16/2015 Tumor Marker    Patient's tumor was tested for the following markers: CA125 Results of the tumor marker test revealed 38.1      03/22/2016 PET scan    Interval progression of hypermetabolic right paratracheal lymph node consistent with metastatic involvement. 2. Stable hypermetabolic right thyroid nodule. Reportedly this has been biopsied in the past.      03/23/2016 Tumor Marker    Patient's tumor was tested for the following markers: CA125 Results of the tumor marker test revealed 62.4      04/13/2016 - 06/01/2016 Radiation Therapy    She received 56 Gy to the chest in 28 fractions       05/09/2016 Imaging    No evidence of left lower extremity deep vein thrombosis. No evidence of a superficial thrombosis of the greater and lesser saphenous veins. Positive for  thrombus noted in several varicosities of the calf. No evidence of Baker's cyst on the left.      05/17/2016 Tumor Marker    Patient's tumor was tested for the following markers: CA125 Results of the tumor marker test revealed 9      06/29/2016 Tumor Marker    Patient's tumor was tested for the following markers: CA125 Results of the tumor marker test revealed 5.9      08/04/2016 Tumor Marker    Patient's tumor was tested for the following markers: CA125 Results of the tumor marker test revealed 6.0      09/12/2016 PET scan    Complete metabolic response to therapy, with resolution of hypermetabolic mediastinal lymphadenopathy since prior exam. No residual or new metastatic disease identified. Stable hypermetabolic right thyroid lobe nodule, which was previously biopsied on 09/20/2015.      03/13/2017 PET scan    1. Solitary focus of recurrent right paratracheal hypermetabolic activity, with a 1.0 cm right lower paratracheal node having a maximum SUV of 9.0. Appearance compatible with recurrent malignancy. 2. Continued hypermetabolic right thyroid nodule, previously biopsied, presumed benign -correlate with prior biopsy results. 3. Other imaging findings of potential clinical significance: Aortic Atherosclerosis (ICD10-I70.0). Mild cardiomegaly. Prominent stool throughout the colon favors constipation.      04/02/2017 Pathology Results  FINE NEEDLE ASPIRATION, ENDOSCOPIC, (EBUS) 4 R NODE (SPECIMEN 1 OF 2 COLLECTED 04/02/17): MALIGNANT CELLS PRESENT, CONSISTENT WITH CARCINOMA. SEE COMMENT. COMMENT: THE MALIGNANT CELLS ARE POSITIVE FOR P53 AND NEGATIVE FOR ESTROGEN RECEPTOR AND TTF-1. THIS PROFILE IS NON-SPECIFIC, BUT THE P53 POSITIVE STAINING IS SUGGESTIVE OF GYNECOLOGIC PRIMARY.       04/11/2017 Procedure    Successful 8 French right internal jugular vein power port placement with its tip at the SVC/RA junction      04/12/2017 Imaging    Normal LV size with mild LV hypertrophy.  EF 55-60%. Normal RV size and systolic function. Aortic valve sclerosis without significant stenosis.      04/18/2017 - 06/14/2017 Chemotherapy    The patient had chemotherapy with Doxil       07/10/2017 Imaging    - Left ventricle: The cavity size was normal. There was mild concentric hypertrophy. Systolic function was normal. The estimated ejection fraction was in the range of 55% to 60%. Wall motion was normal; there were no regional wall motion abnormalities. Doppler parameters are consistent with abnormal left ventricular relaxation (grade 1 diastolic dysfunction). - Aortic valve: Noncoronary cusp mobility was mildly restricted. - Mitral valve: There was mild regurgitation. - Left atrium: The atrium was mildly dilated. - Atrial septum: No defect or patent foramen ovale was identified.  Impressions:  - Compared to November 2018, global LV longitudinal strain remains normal (has increased)      07/11/2017 PET scan    Increased size and hypermetabolic activity of 3.3 cm right thyroid lobe nodule. Thyroid carcinoma cannot be excluded. Recommend repeat ultrasound guided fine needle aspiration to exclude thyroid carcinoma.  New adjacent hypermetabolic 10 mm right supraclavicular lymph node, suspicious for lymph node metastasis.  Slight increase in size and hypermetabolic activity of solitary right paratracheal lymph node.  No evidence of abdominal or pelvic metastatic disease.      07/18/2017 Procedure    1. Technically successful ultrasound guided fine needle aspiration of indeterminate hypermetabolic right-sided thyroid nodule/mass. 2. Technically successful ultrasound-guided core needle biopsy of hypermetabolic right lower cervical lymph node.      07/18/2017 Pathology Results    THYROID, FINE NEEDLE ASPIRATION RIGHT (SPECIMEN 1 OF 1, COLLECTED ON 07/18/2017): ATYPIA OF UNDETERMINED SIGNIFICANCE OR FOLLICULAR LESION OF UNDETERMINED SIGNIFICANCE (BETHESDA CATEGORY III). SEE  COMMENT. COMMENT: THE SPECIMEN CONSISTS OF SMALL AND MEDIUM SIZED GROUPS OF FOLLICULAR EPITHELIAL CELLS WITH MILD CYTOLOGIC ATYPIA INCLUDING NUCLEAR ENLARGEMENT AND HURTHLE CELL CHANGE. SOME GROUPS ARE ARRANGED AS MICROFOLLICLES. THERE IS MINIMAL BACKGROUND COLLOID. BASED ON THESE FEATURES, A FOLLICULAR LESION/NEOPLASM CAN NOT BE ENTIRELY RULED OUT. A SPECIMEN WILL BE SENT FOR AFIRMA TESTING.      07/19/2017 Pathology Results    Lymph node, needle/core biopsy - METASTATIC PAPILLARY SEROUS CARCINOMA. - SEE COMMENT. Microscopic Comment Dr. Vicente Males has reviewed the case and concurs with this interpretation      10/15/2017 PET scan    No new or progressive disease. No evidence of abdominal or pelvic metastatic disease.  Stable hypermetabolic right thyroid lobe nodule and adjacent right supraclavicular lymph node.  Decreased size and hypermetabolic activity of solitary right paratracheal lymph node.       REVIEW OF SYSTEMS:   Constitutional: Denies fevers, chills or abnormal weight loss Eyes: Denies blurriness of vision Ears, nose, mouth, throat, and face: Denies mucositis or sore throat Respiratory: Denies cough, dyspnea or wheezes Cardiovascular: Denies palpitation, chest discomfort or lower extremity swelling Gastrointestinal:  Denies nausea, heartburn or change in bowel  habits Lymphatics: Denies new lymphadenopathy or easy bruising Neurological:Denies numbness, tingling or new weaknesses Behavioral/Psych: Mood is stable, no new changes  All other systems were reviewed with the patient and are negative.  I have reviewed the past medical history, past surgical history, social history and family history with the patient and they are unchanged from previous note.  ALLERGIES:  has No Known Allergies.  MEDICATIONS:  Current Outpatient Medications  Medication Sig Dispense Refill  . amLODipine (NORVASC) 10 MG tablet Take 1 tablet (10 mg total) by mouth daily. 90 tablet 0  . Calcium  Carb-Cholecalciferol 500-600 MG-UNIT TABS Take 1 tablet by mouth daily.     . Cholecalciferol (VITAMIN D3) 2000 units TABS Take 2,000 Units by mouth daily.     . metoprolol tartrate (LOPRESSOR) 50 MG tablet TAKE 1 TABLET (50 MG TOTAL) BY MOUTH 2 (TWO) TIMES DAILY. 60 tablet 1  . simvastatin (ZOCOR) 40 MG tablet Take 40 mg by mouth every evening.    . tacrolimus (PROTOPIC) 0.1 % ointment Apply 1 application topically daily as needed (rash).     . venlafaxine XR (EFFEXOR-XR) 75 MG 24 hr capsule Take 75 mg by mouth daily with breakfast.      No current facility-administered medications for this visit.    Facility-Administered Medications Ordered in Other Visits  Medication Dose Route Frequency Provider Last Rate Last Dose  . 0.9 %  sodium chloride infusion   Intravenous Once Alvy Bimler, Fallan Mccarey, MD      . bevacizumab (AVASTIN) 1,100 mg in sodium chloride 0.9 % 100 mL chemo infusion  14.8 mg/kg (Treatment Plan Recorded) Intravenous Once Alvy Bimler, Abdallah Hern, MD      . heparin lock flush 100 unit/mL  500 Units Intracatheter Once PRN Alvy Bimler, Cree Kunert, MD      . sodium chloride flush (NS) 0.9 % injection 10 mL  10 mL Intracatheter PRN Alvy Bimler, Seung Nidiffer, MD        PHYSICAL EXAMINATION: ECOG PERFORMANCE STATUS: 1 - Symptomatic but completely ambulatory  Vitals:   11/23/17 0851  BP: (!) 144/71  Pulse: 65  Resp: 16  Temp: 98.9 F (37.2 C)  SpO2: 100%   Filed Weights   11/23/17 0851  Weight: 156 lb 12.8 oz (71.1 kg)    GENERAL:alert, no distress and comfortable SKIN: Noted mild redness affecting his skin on the chest wall EYES: normal, Conjunctiva are pink and non-injected, sclera clear OROPHARYNX:no exudate, no erythema and lips, buccal mucosa, and tongue normal  NECK: supple, thyroid normal size, non-tender, without nodularity LYMPH:  no palpable lymphadenopathy in the cervical, axillary or inguinal LUNGS: clear to auscultation and percussion with normal breathing effort HEART: regular rate & rhythm and no  murmurs and no lower extremity edema ABDOMEN:abdomen soft, non-tender and normal bowel sounds Musculoskeletal:no cyanosis of digits and no clubbing  NEURO: alert & oriented x 3 with fluent speech, no focal motor/sensory deficits  LABORATORY DATA:  I have reviewed the data as listed    Component Value Date/Time   NA 142 11/23/2017 0822   NA 141 05/17/2017 0957   K 4.0 11/23/2017 0822   K 3.6 05/17/2017 0957   CL 106 11/23/2017 0822   CL 105 11/19/2012 0848   CO2 27 11/23/2017 0822   CO2 25 05/17/2017 0957   GLUCOSE 91 11/23/2017 0822   GLUCOSE 85 05/17/2017 0957   GLUCOSE 122 (H) 11/19/2012 0848   BUN 22 11/23/2017 0822   BUN 15.2 05/17/2017 0957   CREATININE 0.83 11/23/2017 0822   CREATININE 0.8  05/17/2017 0957   CALCIUM 10.1 11/23/2017 0822   CALCIUM 9.6 05/17/2017 0957   PROT 7.0 11/23/2017 0822   PROT 7.0 05/17/2017 0957   ALBUMIN 3.9 11/23/2017 0822   ALBUMIN 3.9 05/17/2017 0957   AST 20 11/23/2017 0822   AST 25 05/17/2017 0957   ALT 15 11/23/2017 0822   ALT 18 05/17/2017 0957   ALKPHOS 54 11/23/2017 0822   ALKPHOS 57 05/17/2017 0957   BILITOT 0.4 11/23/2017 0822   BILITOT 0.47 05/17/2017 0957   GFRNONAA >60 11/23/2017 0822   GFRAA >60 11/23/2017 0822    No results found for: SPEP, UPEP  Lab Results  Component Value Date   WBC 3.0 (L) 11/23/2017   NEUTROABS 2.0 11/23/2017   HGB 12.0 11/23/2017   HCT 35.5 11/23/2017   MCV 95.7 11/23/2017   PLT 170 11/23/2017      Chemistry      Component Value Date/Time   NA 142 11/23/2017 0822   NA 141 05/17/2017 0957   K 4.0 11/23/2017 0822   K 3.6 05/17/2017 0957   CL 106 11/23/2017 0822   CL 105 11/19/2012 0848   CO2 27 11/23/2017 0822   CO2 25 05/17/2017 0957   BUN 22 11/23/2017 0822   BUN 15.2 05/17/2017 0957   CREATININE 0.83 11/23/2017 0822   CREATININE 0.8 05/17/2017 0957   GLU 158 (H) 01/14/2015 1035      Component Value Date/Time   CALCIUM 10.1 11/23/2017 0822   CALCIUM 9.6 05/17/2017 0957    ALKPHOS 54 11/23/2017 0822   ALKPHOS 57 05/17/2017 0957   AST 20 11/23/2017 0822   AST 25 05/17/2017 0957   ALT 15 11/23/2017 0822   ALT 18 05/17/2017 0957   BILITOT 0.4 11/23/2017 0822   BILITOT 0.47 05/17/2017 0957     All questions were answered. The patient knows to call the clinic with any problems, questions or concerns. No barriers to learning was detected.  I spent 15 minutes counseling the patient face to face. The total time spent in the appointment was 20 minutes and more than 50% was on counseling and review of test results  [name], MD 11/23/2017 10:17 AM

## 2017-11-23 NOTE — Assessment & Plan Note (Signed)
She has chronic leukopenia Observe only She is not symptomatic

## 2017-12-03 MED FILL — VENLAFAXINE HCL ER 75 MG CA: 75 | 90 days supply | Qty: 90 | Fill #3

## 2017-12-13 DIAGNOSIS — Z90722 Acquired absence of ovaries, bilateral: Secondary | ICD-10-CM | POA: Diagnosis not present

## 2017-12-13 DIAGNOSIS — Z87891 Personal history of nicotine dependence: Secondary | ICD-10-CM | POA: Diagnosis not present

## 2017-12-13 DIAGNOSIS — C541 Malignant neoplasm of endometrium: Secondary | ICD-10-CM | POA: Diagnosis not present

## 2017-12-13 DIAGNOSIS — Z9079 Acquired absence of other genital organ(s): Secondary | ICD-10-CM | POA: Diagnosis not present

## 2017-12-13 DIAGNOSIS — Z90710 Acquired absence of both cervix and uterus: Secondary | ICD-10-CM | POA: Diagnosis not present

## 2017-12-14 ENCOUNTER — Encounter: Payer: Self-pay | Admitting: Hematology and Oncology

## 2017-12-14 ENCOUNTER — Inpatient Hospital Stay (HOSPITAL_BASED_OUTPATIENT_CLINIC_OR_DEPARTMENT_OTHER): Payer: 59 | Admitting: Hematology and Oncology

## 2017-12-14 ENCOUNTER — Inpatient Hospital Stay: Payer: 59

## 2017-12-14 ENCOUNTER — Telehealth: Payer: Self-pay | Admitting: Hematology and Oncology

## 2017-12-14 ENCOUNTER — Inpatient Hospital Stay: Payer: 59 | Attending: Hematology and Oncology

## 2017-12-14 VITALS — BP 111/85 | HR 66 | Temp 98.1°F | Resp 18

## 2017-12-14 VITALS — BP 134/73 | HR 66 | Temp 98.1°F | Resp 18 | Ht 63.0 in | Wt 159.0 lb

## 2017-12-14 DIAGNOSIS — Z79899 Other long term (current) drug therapy: Secondary | ICD-10-CM | POA: Insufficient documentation

## 2017-12-14 DIAGNOSIS — Z7189 Other specified counseling: Secondary | ICD-10-CM

## 2017-12-14 DIAGNOSIS — C786 Secondary malignant neoplasm of retroperitoneum and peritoneum: Secondary | ICD-10-CM | POA: Insufficient documentation

## 2017-12-14 DIAGNOSIS — R590 Localized enlarged lymph nodes: Secondary | ICD-10-CM

## 2017-12-14 DIAGNOSIS — L309 Dermatitis, unspecified: Secondary | ICD-10-CM | POA: Insufficient documentation

## 2017-12-14 DIAGNOSIS — Z5112 Encounter for antineoplastic immunotherapy: Secondary | ICD-10-CM | POA: Diagnosis not present

## 2017-12-14 DIAGNOSIS — Z9221 Personal history of antineoplastic chemotherapy: Secondary | ICD-10-CM | POA: Diagnosis not present

## 2017-12-14 DIAGNOSIS — Z923 Personal history of irradiation: Secondary | ICD-10-CM | POA: Insufficient documentation

## 2017-12-14 DIAGNOSIS — C541 Malignant neoplasm of endometrium: Secondary | ICD-10-CM | POA: Diagnosis not present

## 2017-12-14 DIAGNOSIS — D61818 Other pancytopenia: Secondary | ICD-10-CM | POA: Diagnosis not present

## 2017-12-14 DIAGNOSIS — I10 Essential (primary) hypertension: Secondary | ICD-10-CM

## 2017-12-14 DIAGNOSIS — R946 Abnormal results of thyroid function studies: Secondary | ICD-10-CM | POA: Insufficient documentation

## 2017-12-14 DIAGNOSIS — Z95828 Presence of other vascular implants and grafts: Secondary | ICD-10-CM

## 2017-12-14 DIAGNOSIS — C770 Secondary and unspecified malignant neoplasm of lymph nodes of head, face and neck: Secondary | ICD-10-CM

## 2017-12-14 LAB — COMPREHENSIVE METABOLIC PANEL
ALT: 19 U/L (ref 0–44)
AST: 21 U/L (ref 15–41)
Albumin: 3.6 g/dL (ref 3.5–5.0)
Alkaline Phosphatase: 65 U/L (ref 38–126)
Anion gap: 8 (ref 5–15)
BUN: 24 mg/dL — ABNORMAL HIGH (ref 8–23)
CO2: 26 mmol/L (ref 22–32)
Calcium: 9.5 mg/dL (ref 8.9–10.3)
Chloride: 107 mmol/L (ref 98–111)
Creatinine, Ser: 0.81 mg/dL (ref 0.44–1.00)
GFR calc Af Amer: >60 mL/min (ref >60)
GFR calc non Af Amer: >60 mL/min (ref >60)
Glucose, Bld: 115 mg/dL — ABNORMAL HIGH (ref 70–99)
Potassium: 4.1 mmol/L (ref 3.5–5.1)
Sodium: 141 mmol/L (ref 135–145)
Total Bilirubin: 0.3 mg/dL (ref 0.3–1.2)
Total Protein: 6.5 g/dL (ref 6.5–8.1)

## 2017-12-14 LAB — CBC WITH DIFFERENTIAL (CANCER CENTER ONLY)
Basophils Absolute: 0 10*3/uL (ref 0.0–0.1)
Basophils Relative: 1 %
Eosinophils Absolute: 0.2 10*3/uL (ref 0.0–0.5)
Eosinophils Relative: 6 %
HCT: 33.7 % — ABNORMAL LOW (ref 34.8–46.6)
Hemoglobin: 11.4 g/dL — ABNORMAL LOW (ref 11.6–15.9)
Lymphocytes Relative: 16 %
Lymphs Abs: 0.5 10*3/uL — ABNORMAL LOW (ref 0.9–3.3)
MCH: 32.5 pg (ref 25.1–34.0)
MCHC: 34 g/dL (ref 31.5–36.0)
MCV: 95.8 fL (ref 79.5–101.0)
Monocytes Absolute: 0.2 10*3/uL (ref 0.1–0.9)
Monocytes Relative: 6 %
Neutro Abs: 2.3 10*3/uL (ref 1.5–6.5)
Neutrophils Relative %: 71 %
Platelet Count: 156 10*3/uL (ref 145–400)
RBC: 3.51 MIL/uL — ABNORMAL LOW (ref 3.70–5.45)
RDW: 13 % (ref 11.2–14.5)
WBC Count: 3.3 10*3/uL — ABNORMAL LOW (ref 3.9–10.3)

## 2017-12-14 LAB — TOTAL PROTEIN, URINE DIPSTICK: Protein, ur: NEGATIVE mg/dL

## 2017-12-14 MED ORDER — SODIUM CHLORIDE 0.9 % IV SOLN
Freq: Once | INTRAVENOUS | Status: AC
  Administered 2017-12-14: 10:00:00 via INTRAVENOUS

## 2017-12-14 MED ORDER — HEPARIN SOD (PORK) LOCK FLUSH 100 UNIT/ML IV SOLN
500.0000 [IU] | Freq: Once | INTRAVENOUS | Status: AC | PRN
  Administered 2017-12-14: 500 [IU]
  Filled 2017-12-14: qty 5

## 2017-12-14 MED ORDER — BEVACIZUMAB CHEMO INJECTION 400 MG/16ML
14.8000 mg/kg | Freq: Once | INTRAVENOUS | Status: AC
  Administered 2017-12-14: 1100 mg via INTRAVENOUS
  Filled 2017-12-14: qty 32

## 2017-12-14 MED ORDER — SODIUM CHLORIDE 0.9% FLUSH
10.0000 mL | Freq: Once | INTRAVENOUS | Status: AC
  Administered 2017-12-14: 10 mL
  Filled 2017-12-14: qty 10

## 2017-12-14 MED ORDER — SODIUM CHLORIDE 0.9% FLUSH
10.0000 mL | INTRAVENOUS | Status: DC | PRN
  Administered 2017-12-14: 10 mL
  Filled 2017-12-14: qty 10

## 2017-12-14 NOTE — Assessment & Plan Note (Signed)
Her blood pressure is better controlled We will continue the same antihypertensives.   She is instructed to check her blood pressure regularly and call me if her blood pressure is poorly controlled again

## 2017-12-14 NOTE — Telephone Encounter (Signed)
Patient scheduled in new patient slot due to avail of treatment area. Gave patient avs and calendar of upcoming aug appts.

## 2017-12-14 NOTE — Assessment & Plan Note (Signed)
She has chronic leukopenia Observe only She is not symptomatic

## 2017-12-14 NOTE — Assessment & Plan Note (Addendum)
She tolerated treatment well without major side effects except for hypertension which is well controlled with current antihypertensives I recommend we continue treatment indefinitely if possible I plan to repeat imaging study again around end of August 2019

## 2017-12-14 NOTE — Patient Instructions (Signed)
[facility] Discharge Instructions for Patients Receiving Chemotherapy  Today you received the following chemotherapy agents Avastin  To help prevent nausea and vomiting after your treatment, we encourage you to take your nausea medication as directed   If you develop nausea and vomiting that is not controlled by your nausea medication, call the clinic.   BELOW ARE SYMPTOMS THAT SHOULD BE REPORTED IMMEDIATELY:  *FEVER GREATER THAN 100.5 F  *CHILLS WITH OR WITHOUT FEVER  NAUSEA AND VOMITING THAT IS NOT CONTROLLED WITH YOUR NAUSEA MEDICATION  *UNUSUAL SHORTNESS OF BREATH  *UNUSUAL BRUISING OR BLEEDING  TENDERNESS IN MOUTH AND THROAT WITH OR WITHOUT PRESENCE OF ULCERS  *URINARY PROBLEMS  *BOWEL PROBLEMS  UNUSUAL RASH Items with * indicate a potential emergency and should be followed up as soon as possible.  Feel free to call the clinic should you have any questions or concerns. The clinic phone number is [phone].  Please show the CHEMO ALERT CARD at check-in to the Emergency Department and triage nurse.   

## 2017-12-14 NOTE — Progress Notes (Signed)
[facility] OFFICE PROGRESS NOTE  Patient Care Team: [name], MD as PCP - General (Family Medicine) Reynold Bowen, MD as Consulting Physician (Endocrinology)  ASSESSMENT & PLAN:  Endometrial cancer [facility]) She tolerated treatment well without major side effects except for hypertension which is well controlled with current antihypertensives I recommend we continue treatment indefinitely if possible I plan to repeat imaging study again around end of August 2019  Pancytopenia, acquired [facility]) She has chronic leukopenia Observe only She is not symptomatic  Essential hypertension Her blood pressure is better controlled We will continue the same antihypertensives.   She is instructed to check her blood pressure regularly and call me if her blood pressure is poorly controlled again  Dermatitis She is currently being evaluated by dermatologist for dermatitis It is not severe I would defer to them for further management.   Orders Placed This Encounter  Procedures  . NM PET Image Restag (PS) Skull Base To Thigh    Standing Status:   Future    Standing Expiration Date:   12/15/2018    Order Specific Question:   If indicated for the ordered procedure, I authorize the administration of a radiopharmaceutical per Radiology protocol    Answer:   Yes    Order Specific Question:   Preferred imaging location?    Answer:   [facility]    Order Specific Question:   Radiology Contrast Protocol - do NOT remove file path    Answer:   [email or web address]    INTERVAL HISTORY: Please see below for problem oriented charting. She returns for further follow-up She continues to have intermittent skin changes with dermatitis It does not bother her much She denies new lymphadenopathy No recent infection Her blood pressure is better controlled with additional blood pressure medicine She just saw her GYN oncologist recently with negative pelvic  exam   SUMMARY OF ONCOLOGIC HISTORY: Oncology History   [employer] testing done 11-2015 on surgical path from 2014: MS stable TMB low 4 muts/mb ATM [id number] ERBB3 T389K E2H2 rearrangement exon 9 PPP2R1A P179R TP53 I195N    ER- APPROXIMATELY 25-35% STAINING IN NEOPLASTIC CELLS (INTERMEDIATE)  PR- APPROXIMATELY 25-35% STAINING IN NEOPLASTIC CELLS (STRONG)  Repeat biopsy 04/02/17: ER negative, Her 2 negative     Endometrial cancer ([place])   06/20/2012 Pathology Results    Biopsy positive for papillary serous carcinoma      06/20/2012 Genetic Testing    [employer] One testing done 11-2015 on surgical path from 2014: MS stable TMB low 4 muts/mb ATM [id number] ERBB3 T389K E2H2 rearrangement exon 9 PPP2R1A P179R TP53 I195N       06/20/2012 Initial Diagnosis    Patient presented to PCP with intermittent vaginal bleeding since ~ Oct 2013, endometrial biopsy 06-20-12 with complex endometrial hyperplasia with atypia      06/26/2012 Imaging    Thickened endometrial lining in a postmenopausal patient experiencing vaginal bleeding. In the setting of post-menopausal bleeding, endometrial sampling is indicated to exclude carcinoma. No focal myometrial abnormalities are seen.  Normal left ovary and non-visualized right ovary      07/30/2012 Surgery    Dr. Polly Cobia performed robotic hysterectomy with bilateral salpingo-oophorectomy, bilateral pelvic lymph node dissection and periaortic lymph node dissection. Intraoperatively on frozen section, the patient was noted to have a large endometrial polyp with changes within the polyp consistent for high-grade malignancy, possibly papillary serous carcinoma. There is no obvious extrauterine disease noted.        07/30/2012 Pathology Results  [phone] SUPPLEMENTAL REPORT  THE ENDOMETRIAL CARCINOMA WAS ANALYZED FOR DNA MISMATCH REPAIR PROTEINS.IMMUNOHISTOCHEMICALLY, THE NEOPLASM RETAINED NUCLEAR EXPRESSION OF  4 GENE PRODUCTS, MLH1, MSH2, MSH6, AND PMS2, INVOLVED IN DNA MISMATCH REPAIR.POSITIVE AND NEGATIVE CONTROLS WORKED APPROPRIATELY.  PER REQUEST, AN ER AND PR ARE PERFORMED ON BLOCK 1I.  ER- APPROXIMATELY 25-35% STAINING IN NEOPLASTIC CELLS (INTERMEDIATE)  PR- APPROXIMATELY 25-35% STAINING IN NEOPLASTIC CELLS (STRONG)  ER AND PR PREDOMINANTLY SHOW STAINING IN THE SEROUS COMPONENT.  DIAGNOSIS  1. UTERUS, CERVIX WITH BILATERAL FALLOPIAN TUBES AND OVARIES,  HYSTERECTOMY AND BILATERAL SALPINGO-OOPHORECTOMY:  HIGH GRADE/POORLY DIFFERENTIATED ENDOMETRIAL ADENOCARCINOMA WITH SOLID AND SEROUS PAPILLARY COMPONENTS.  HISTOPATHOLOGIC TYPE:A VARIETY OF PATTERNS WERE PRESENT, ALL OF WHICH SHOULD BE CONSIDERED TO BE HIGH GRADE.SEROUS PAPILLARY CARCINOMA WAS SEEN OCCURRING ADJACENT TO SOLID ENDOMETRIAL CARCINOMA WITH MARKED ANAPLASIA AND GIANT CELLS. SIZE:TUMOR MEASURED AT LEAST 4.8 CM IN GREATEST HORIZONTAL DIMENSION.  GRADE: POORLY DIFFERENTIATED OR HIGH GRADE. DEPTH OF INVASION: NO DEFINITE MYOMETRIAL INVOLVEMENT WAS SEEN.THE TUMOR APPEARED CONFINED TO THE POLYP AS WELL AS SURFACE ENDOMETRIUM. WHERE MYOMETRIAL THICKNESS NOT APPLICABLE. SEROSAL INVOLVEMENT: NOT DEMONSTRATED.  ENDOCERVICAL INVOLVEMENT:NOT DEMONSTRATED. RESECTION MARGINS: FREE OF INVOLVEMENT. EXTRAUTERINE EXTENSION:NOT DEMONSTRATED. ANGIOLYMPHATIC INVASION: NOT DEFINITELY SEEN. TOTAL NODES EXAMINED:22.  PELVIC NODES EXAMINED:20.  PELVIC NODES INVOLVED:0.  PARA-AORTIC NODES 2. EXAMINED:  PARA-AORTIC NODES 0. INVOLVED TNM STAGE: T1A N0 MX. AJCC STAGE GROUPING: IA. FIGO STAGE:IA.      08/26/2012 Imaging    No CT evidence for intra-abdominal or pelvic metastatic disease. Trace free pelvic fluid, presumably postoperative although the date of surgery is not documented in the electronic medical record.      08/26/2012 - 12/13/2012 Chemotherapy     She received 6 cycles of carbo/taxol       10/29/2012 - 11/27/2012 Radiation Therapy    May 27, June 5,  June 11, June 19, November 27, 2012: Proximal vagina 30 Gy in 5 fractions        01/17/2013 Imaging    1.  No evidence of recurrent or metastatic disease. 2.  No acute abnormality involving the abdomen or pelvis. 3.  Mild diffuse hepatic steatosis. 4.  Very small supraumbilical midline anterior abdominal wall hernia containing fat, unchanged      01/22/2014 Imaging    No evidence for metastatic or recurrent disease. 2. No bowel obstruction.  Normal appendix. 3. Small fat containing hernia, stable in appearance. 4. Status post hysterectomy and bilateral oophorectomy      02/19/2014 Imaging    No pulmonary lesions are identified. 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 No other sites of metastatic disease identified within the abdomen or pelvis.        11/27/2014 - 02/11/2015 Chemotherapy    She received 4 cycles of carbo/taxol       03/01/2015 PET scan    Single hypermetabolic small retroperitoneal lymph node along the aorta. 2. No evidence of metastatic disease otherwise in the abdomen or pelvis. No evidence local recurrence. 3. Intensely hypermetabolic enlarged nodule adjacent to the RIGHT lobe of thyroid gland. This presumably represents the biopsied lesion in clinician report which was found to be benign thyroid tissue.      04/05/2015 - 05/13/2015 Radiation Therapy    She received 50.4 gray in 28 fractions with simultaneous integrated boost to  22 gray      06/17/2015 Imaging    No acute process or evidence of metastatic disease in the abdomen or pelvis. Resolution of previously described retroperitoneal adenopathy. 2.  Possible  constipation. 3. Atherosclerosis.      06/17/2015 Tumor Marker    Patient's tumor was tested for the following markers: CA125 Results of the tumor marker test revealed 33      08/12/2015 Tumor Marker    Patient's tumor was tested for the following markers: CA125 Results of the tumor marker test revealed 52      08/31/2015 PET scan    Development of right paratracheal hypermetabolic adenopathy, consistent with nodal metastasis. 2. The previously described isolated abdominal retroperitoneal hypermetabolic node has resolved. 3. Persistent hypermetabolic right thyroid nodule, per report previously biopsied. Correlate with those results. 4.  Possible constipation.      09/09/2015 -  Anti-estrogen oral therapy    She has been receiving alternative treatment between megace and tamoxifen      09/09/2015 Tumor Marker    Patient's tumor was tested for the following markers: CA125 Results of the tumor marker test revealed 37.8      09/20/2015 Procedure    Technically successful ultrasound-guided thyroid aspiration biopsy , dominant right nodule      09/20/2015 Pathology Results    THYROID, RIGHT, FINE NEEDLE ASPIRATION (SPECIMEN 1 OF 1 COLLECTED 09/20/15): FINDINGS CONSISTENT WITH BENIGN THYROID NODULE (BETHESDA CATEGORY II).      10/28/2015 Tumor Marker    Patient's tumor was tested for the following markers: CA125 Results of the tumor marker test revealed 53.2      11/04/2015 Imaging    Stable benign right thyroid nodule      12/16/2015 Tumor Marker    Patient's tumor was tested for the following markers: CA125 Results of the tumor marker test revealed 38.1      03/22/2016 PET scan    Interval progression of hypermetabolic right paratracheal lymph node consistent with metastatic involvement. 2. Stable hypermetabolic right thyroid nodule. Reportedly this has been biopsied in the past.      03/23/2016 Tumor Marker    Patient's tumor was tested for the following markers: CA125 Results of  the tumor marker test revealed 62.4      04/13/2016 - 06/01/2016 Radiation Therapy    She received 56 Gy to the chest in 28 fractions       05/09/2016 Imaging    No evidence of left lower extremity deep vein thrombosis. No evidence of a superficial thrombosis of the greater and lesser saphenous veins. Positive for thrombus noted in several varicosities of the calf. No evidence of Baker's cyst on the left.      05/17/2016 Tumor Marker    Patient's tumor was tested for the following markers: CA125 Results of the tumor marker test revealed 9      06/29/2016 Tumor Marker    Patient's tumor was tested for the following markers: CA125 Results of the tumor marker test revealed 5.9      08/04/2016 Tumor Marker    Patient's tumor was tested for the following markers: CA125 Results of the tumor marker test revealed 6.0      09/12/2016 PET scan    Complete metabolic response to therapy, with resolution of hypermetabolic mediastinal lymphadenopathy since prior exam. No residual or new metastatic disease identified. Stable hypermetabolic right thyroid lobe nodule, which was previously biopsied on 09/20/2015.      03/13/2017 PET scan    1. Solitary focus  of recurrent right paratracheal hypermetabolic activity, with a 1.0 cm right lower paratracheal node having a maximum SUV of 9.0. Appearance compatible with recurrent malignancy. 2. Continued hypermetabolic right thyroid nodule, previously biopsied, presumed benign -correlate with prior biopsy results. 3. Other imaging findings of potential clinical significance: Aortic Atherosclerosis (ICD10-I70.0). Mild cardiomegaly. Prominent stool throughout the colon favors constipation.      04/02/2017 Pathology Results    FINE NEEDLE ASPIRATION, ENDOSCOPIC, (EBUS) 4 R NODE (SPECIMEN 1 OF 2 COLLECTED 04/02/17): MALIGNANT CELLS PRESENT, CONSISTENT WITH CARCINOMA. SEE COMMENT. COMMENT: THE MALIGNANT CELLS ARE POSITIVE FOR P53 AND NEGATIVE FOR ESTROGEN  RECEPTOR AND TTF-1. THIS PROFILE IS NON-SPECIFIC, BUT THE P53 POSITIVE STAINING IS SUGGESTIVE OF GYNECOLOGIC PRIMARY.       04/11/2017 Procedure    Successful 8 French right internal jugular vein power port placement with its tip at the SVC/RA junction      04/12/2017 Imaging    Normal LV size with mild LV hypertrophy. EF 55-60%. Normal RV size and systolic function. Aortic valve sclerosis without significant stenosis.      04/18/2017 - 06/14/2017 Chemotherapy    The patient had chemotherapy with Doxil       07/10/2017 Imaging    - Left ventricle: The cavity size was normal. There was mild concentric hypertrophy. Systolic function was normal. The estimated ejection fraction was in the range of 55% to 60%. Wall motion was normal; there were no regional wall motion abnormalities. Doppler parameters are consistent with abnormal left ventricular relaxation (grade 1 diastolic dysfunction). - Aortic valve: Noncoronary cusp mobility was mildly restricted. - Mitral valve: There was mild regurgitation. - Left atrium: The atrium was mildly dilated. - Atrial septum: No defect or patent foramen ovale was identified.  Impressions:  - Compared to November 2018, global LV longitudinal strain remains normal (has increased)      07/11/2017 PET scan    Increased size and hypermetabolic activity of 3.3 cm right thyroid lobe nodule. Thyroid carcinoma cannot be excluded. Recommend repeat ultrasound guided fine needle aspiration to exclude thyroid carcinoma.  New adjacent hypermetabolic 10 mm right supraclavicular lymph node, suspicious for lymph node metastasis.  Slight increase in size and hypermetabolic activity of solitary right paratracheal lymph node.  No evidence of abdominal or pelvic metastatic disease.      07/18/2017 Procedure    1. Technically successful ultrasound guided fine needle aspiration of indeterminate hypermetabolic right-sided thyroid nodule/mass. 2. Technically successful  ultrasound-guided core needle biopsy of hypermetabolic right lower cervical lymph node.      07/18/2017 Pathology Results    THYROID, FINE NEEDLE ASPIRATION RIGHT (SPECIMEN 1 OF 1, COLLECTED ON 07/18/2017): ATYPIA OF UNDETERMINED SIGNIFICANCE OR FOLLICULAR LESION OF UNDETERMINED SIGNIFICANCE (BETHESDA CATEGORY III). SEE COMMENT. COMMENT: THE SPECIMEN CONSISTS OF SMALL AND MEDIUM SIZED GROUPS OF FOLLICULAR EPITHELIAL CELLS WITH MILD CYTOLOGIC ATYPIA INCLUDING NUCLEAR ENLARGEMENT AND HURTHLE CELL CHANGE. SOME GROUPS ARE ARRANGED AS MICROFOLLICLES. THERE IS MINIMAL BACKGROUND COLLOID. BASED ON THESE FEATURES, A FOLLICULAR LESION/NEOPLASM CAN NOT BE ENTIRELY RULED OUT. A SPECIMEN WILL BE SENT FOR AFIRMA TESTING.      07/19/2017 Pathology Results    Lymph node, needle/core biopsy - METASTATIC PAPILLARY SEROUS CARCINOMA. - SEE COMMENT. Microscopic Comment Dr. Vicente Males has reviewed the case and concurs with this interpretation      10/15/2017 PET scan    No new or progressive disease. No evidence of abdominal or pelvic metastatic disease.  Stable hypermetabolic right thyroid lobe nodule and adjacent right supraclavicular lymph node.  Decreased size and hypermetabolic activity of solitary right paratracheal lymph node.       REVIEW OF SYSTEMS:   Constitutional: Denies fevers, chills or abnormal weight loss Eyes: Denies blurriness of vision Ears, nose, mouth, throat, and face: Denies mucositis or sore throat Respiratory: Denies cough, dyspnea or wheezes Cardiovascular: Denies palpitation, chest discomfort or lower extremity swelling Gastrointestinal:  Denies nausea, heartburn or change in bowel habits Lymphatics: Denies new lymphadenopathy or easy bruising Neurological:Denies numbness, tingling or new weaknesses Behavioral/Psych: Mood is stable, no new changes  All other systems were reviewed with the patient and are negative.  I have reviewed the past medical history, past surgical  history, social history and family history with the patient and they are unchanged from previous note.  ALLERGIES:  has No Known Allergies.  MEDICATIONS:  Current Outpatient Medications  Medication Sig Dispense Refill  . amLODipine (NORVASC) 10 MG tablet Take 1 tablet (10 mg total) by mouth daily. 90 tablet 0  . Calcium Carb-Cholecalciferol 500-600 MG-UNIT TABS Take 1 tablet by mouth daily.     . Cholecalciferol (VITAMIN D3) 2000 units TABS Take 2,000 Units by mouth daily.     . metoprolol tartrate (LOPRESSOR) 50 MG tablet TAKE 1 TABLET (50 MG TOTAL) BY MOUTH 2 (TWO) TIMES DAILY. 60 tablet 1  . simvastatin (ZOCOR) 40 MG tablet Take 40 mg by mouth every evening.    . tacrolimus (PROTOPIC) 0.1 % ointment Apply 1 application topically daily as needed (rash).     . venlafaxine XR (EFFEXOR-XR) 75 MG 24 hr capsule Take 75 mg by mouth daily with breakfast.      No current facility-administered medications for this visit.    Facility-Administered Medications Ordered in Other Visits  Medication Dose Route Frequency Provider Last Rate Last Dose  . sodium chloride flush (NS) 0.9 % injection 10 mL  10 mL Intracatheter PRN Alvy Bimler, Teddie Curd, MD   10 mL at 12/14/17 1135    PHYSICAL EXAMINATION: ECOG PERFORMANCE STATUS: 1 - Symptomatic but completely ambulatory  Vitals:   12/14/17 0937  BP: 134/73  Pulse: 66  Resp: 18  Temp: 98.1 F (36.7 C)  SpO2: 100%   Filed Weights   12/14/17 0937  Weight: 159 lb (72.1 kg)    GENERAL:alert, no distress and comfortable SKIN: Noted mild dermatitis on her skin EYES: normal, Conjunctiva are pink and non-injected, sclera clear OROPHARYNX:no exudate, no erythema and lips, buccal mucosa, and tongue normal  NECK: supple, thyroid normal size, non-tender, without nodularity LYMPH:  no palpable lymphadenopathy in the cervical, axillary or inguinal LUNGS: clear to auscultation and percussion with normal breathing effort HEART: regular rate & rhythm and no murmurs  and no lower extremity edema ABDOMEN:abdomen soft, non-tender and normal bowel sounds Musculoskeletal:no cyanosis of digits and no clubbing  NEURO: alert & oriented x 3 with fluent speech, no focal motor/sensory deficits  LABORATORY DATA:  I have reviewed the data as listed    Component Value Date/Time   NA 141 12/14/2017 0850   NA 141 05/17/2017 0957   K 4.1 12/14/2017 0850   K 3.6 05/17/2017 0957   CL 107 12/14/2017 0850   CL 105 11/19/2012 0848   CO2 26 12/14/2017 0850   CO2 25 05/17/2017 0957   GLUCOSE 115 (H) 12/14/2017 0850   GLUCOSE 85 05/17/2017 0957   GLUCOSE 122 (H) 11/19/2012 0848   BUN 24 (H) 12/14/2017 0850   BUN 15.2 05/17/2017 0957   CREATININE 0.81 12/14/2017 0850   CREATININE 0.8 05/17/2017  0957   CALCIUM 9.5 12/14/2017 0850   CALCIUM 9.6 05/17/2017 0957   PROT 6.5 12/14/2017 0850   PROT 7.0 05/17/2017 0957   ALBUMIN 3.6 12/14/2017 0850   ALBUMIN 3.9 05/17/2017 0957   AST 21 12/14/2017 0850   AST 25 05/17/2017 0957   ALT 19 12/14/2017 0850   ALT 18 05/17/2017 0957   ALKPHOS 65 12/14/2017 0850   ALKPHOS 57 05/17/2017 0957   BILITOT 0.3 12/14/2017 0850   BILITOT 0.47 05/17/2017 0957   GFRNONAA >60 12/14/2017 0850   GFRAA >60 12/14/2017 0850    No results found for: SPEP, UPEP  Lab Results  Component Value Date   WBC 3.3 (L) 12/14/2017   NEUTROABS 2.3 12/14/2017   HGB 11.4 (L) 12/14/2017   HCT 33.7 (L) 12/14/2017   MCV 95.8 12/14/2017   PLT 156 12/14/2017      Chemistry      Component Value Date/Time   NA 141 12/14/2017 0850   NA 141 05/17/2017 0957   K 4.1 12/14/2017 0850   K 3.6 05/17/2017 0957   CL 107 12/14/2017 0850   CL 105 11/19/2012 0848   CO2 26 12/14/2017 0850   CO2 25 05/17/2017 0957   BUN 24 (H) 12/14/2017 0850   BUN 15.2 05/17/2017 0957   CREATININE 0.81 12/14/2017 0850   CREATININE 0.8 05/17/2017 0957   GLU 158 (H) 01/14/2015 1035      Component Value Date/Time   CALCIUM 9.5 12/14/2017 0850   CALCIUM 9.6 05/17/2017  0957   ALKPHOS 65 12/14/2017 0850   ALKPHOS 57 05/17/2017 0957   AST 21 12/14/2017 0850   AST 25 05/17/2017 0957   ALT 19 12/14/2017 0850   ALT 18 05/17/2017 0957   BILITOT 0.3 12/14/2017 0850   BILITOT 0.47 05/17/2017 0957      All questions were answered. The patient knows to call the clinic with any problems, questions or concerns. No barriers to learning was detected.  I spent 15 minutes counseling the patient face to face. The total time spent in the appointment was 20 minutes and more than 50% was on counseling and review of test results  [name], MD 12/14/2017 11:51 AM

## 2017-12-14 NOTE — Assessment & Plan Note (Signed)
She is currently being evaluated by dermatologist for dermatitis It is not severe I would defer to them for further management.

## 2017-12-18 ENCOUNTER — Telehealth: Payer: Self-pay | Admitting: Hematology and Oncology

## 2017-12-18 MED FILL — METOPROLOL TARTRATE 50 MG T: 50 | 30 days supply | Qty: 60 | Fill #1

## 2017-12-18 NOTE — Telephone Encounter (Signed)
I have reviewed recommendation with the patient over the phone  after reviewing progress note from her GYN oncologist.  I have addressed all her questions and concerns

## 2017-12-27 ENCOUNTER — Other Ambulatory Visit: Payer: Self-pay | Admitting: Hematology and Oncology

## 2017-12-27 MED FILL — AMLODIPINE BESYLATE 10 MG T: 10 | 90 days supply | Qty: 90 | Fill #0

## 2017-12-28 DIAGNOSIS — F411 Generalized anxiety disorder: Secondary | ICD-10-CM | POA: Diagnosis not present

## 2018-01-03 ENCOUNTER — Other Ambulatory Visit: Payer: 59

## 2018-01-04 ENCOUNTER — Other Ambulatory Visit: Payer: 59

## 2018-01-04 ENCOUNTER — Encounter: Payer: Self-pay | Admitting: Hematology and Oncology

## 2018-01-04 ENCOUNTER — Inpatient Hospital Stay: Payer: 59

## 2018-01-04 ENCOUNTER — Inpatient Hospital Stay: Payer: 59 | Attending: Hematology and Oncology | Admitting: Hematology and Oncology

## 2018-01-04 VITALS — BP 143/84 | HR 59

## 2018-01-04 DIAGNOSIS — T451X5A Adverse effect of antineoplastic and immunosuppressive drugs, initial encounter: Secondary | ICD-10-CM

## 2018-01-04 DIAGNOSIS — L309 Dermatitis, unspecified: Secondary | ICD-10-CM | POA: Insufficient documentation

## 2018-01-04 DIAGNOSIS — C770 Secondary and unspecified malignant neoplasm of lymph nodes of head, face and neck: Secondary | ICD-10-CM

## 2018-01-04 DIAGNOSIS — C541 Malignant neoplasm of endometrium: Secondary | ICD-10-CM

## 2018-01-04 DIAGNOSIS — Z95828 Presence of other vascular implants and grafts: Secondary | ICD-10-CM

## 2018-01-04 DIAGNOSIS — F418 Other specified anxiety disorders: Secondary | ICD-10-CM | POA: Insufficient documentation

## 2018-01-04 DIAGNOSIS — R590 Localized enlarged lymph nodes: Secondary | ICD-10-CM | POA: Diagnosis not present

## 2018-01-04 DIAGNOSIS — Z5112 Encounter for antineoplastic immunotherapy: Secondary | ICD-10-CM | POA: Insufficient documentation

## 2018-01-04 DIAGNOSIS — Z7189 Other specified counseling: Secondary | ICD-10-CM

## 2018-01-04 DIAGNOSIS — D701 Agranulocytosis secondary to cancer chemotherapy: Secondary | ICD-10-CM | POA: Diagnosis not present

## 2018-01-04 DIAGNOSIS — I10 Essential (primary) hypertension: Secondary | ICD-10-CM | POA: Diagnosis not present

## 2018-01-04 LAB — CBC WITH DIFFERENTIAL (CANCER CENTER ONLY)
Basophils Absolute: 0 10*3/uL (ref 0.0–0.1)
Basophils Relative: 1 %
Eosinophils Absolute: 0.2 10*3/uL (ref 0.0–0.5)
Eosinophils Relative: 6 %
HCT: 35.6 % (ref 34.8–46.6)
Hemoglobin: 12.3 g/dL (ref 11.6–15.9)
Lymphocytes Relative: 16 %
Lymphs Abs: 0.5 10*3/uL — ABNORMAL LOW (ref 0.9–3.3)
MCH: 32.8 pg (ref 25.1–34.0)
MCHC: 34.4 g/dL (ref 31.5–36.0)
MCV: 95.3 fL (ref 79.5–101.0)
Monocytes Absolute: 0.2 10*3/uL (ref 0.1–0.9)
Monocytes Relative: 8 %
Neutro Abs: 2.1 10*3/uL (ref 1.5–6.5)
Neutrophils Relative %: 69 %
Platelet Count: 159 10*3/uL (ref 145–400)
RBC: 3.74 MIL/uL (ref 3.70–5.45)
RDW: 12.9 % (ref 11.2–14.5)
WBC Count: 3.1 10*3/uL — ABNORMAL LOW (ref 3.9–10.3)

## 2018-01-04 LAB — COMPREHENSIVE METABOLIC PANEL
ALT: 21 U/L (ref 0–44)
AST: 23 U/L (ref 15–41)
Albumin: 3.7 g/dL (ref 3.5–5.0)
Alkaline Phosphatase: 53 U/L (ref 38–126)
Anion gap: 9 (ref 5–15)
BUN: 22 mg/dL (ref 8–23)
CO2: 26 mmol/L (ref 22–32)
Calcium: 9.6 mg/dL (ref 8.9–10.3)
Chloride: 106 mmol/L (ref 98–111)
Creatinine, Ser: 0.79 mg/dL (ref 0.44–1.00)
GFR calc Af Amer: >60 mL/min (ref >60)
GFR calc non Af Amer: >60 mL/min (ref >60)
Glucose, Bld: 95 mg/dL (ref 70–99)
Potassium: 3.8 mmol/L (ref 3.5–5.1)
Sodium: 141 mmol/L (ref 135–145)
Total Bilirubin: 0.5 mg/dL (ref 0.3–1.2)
Total Protein: 6.8 g/dL (ref 6.5–8.1)

## 2018-01-04 LAB — TOTAL PROTEIN, URINE DIPSTICK: Protein, ur: 30 mg/dL — AB

## 2018-01-04 MED ORDER — SODIUM CHLORIDE 0.9% FLUSH
10.0000 mL | INTRAVENOUS | Status: DC | PRN
  Administered 2018-01-04: 10 mL
  Filled 2018-01-04: qty 10

## 2018-01-04 MED ORDER — SODIUM CHLORIDE 0.9% FLUSH
10.0000 mL | Freq: Once | INTRAVENOUS | Status: AC
  Administered 2018-01-04: 10 mL
  Filled 2018-01-04: qty 10

## 2018-01-04 MED ORDER — HEPARIN SOD (PORK) LOCK FLUSH 100 UNIT/ML IV SOLN
500.0000 [IU] | Freq: Once | INTRAVENOUS | Status: AC | PRN
  Administered 2018-01-04: 500 [IU]
  Filled 2018-01-04: qty 5

## 2018-01-04 MED ORDER — SODIUM CHLORIDE 0.9 % IV SOLN
Freq: Once | INTRAVENOUS | Status: AC
  Administered 2018-01-04: 09:00:00 via INTRAVENOUS
  Filled 2018-01-04: qty 250

## 2018-01-04 MED ORDER — SODIUM CHLORIDE 0.9 % IV SOLN
14.8000 mg/kg | Freq: Once | INTRAVENOUS | Status: AC
  Administered 2018-01-04: 1100 mg via INTRAVENOUS
  Filled 2018-01-04: qty 32

## 2018-01-04 NOTE — Assessment & Plan Note (Signed)
She has chronic leukopenia Observe only She is not symptomatic

## 2018-01-04 NOTE — Patient Instructions (Signed)
[facility] Discharge Instructions for Patients Receiving Chemotherapy  Today you received the following chemotherapy agents Avastin.   To help prevent nausea and vomiting after your treatment, we encourage you to take your nausea medication as prescribed.    If you develop nausea and vomiting that is not controlled by your nausea medication, call the clinic.   BELOW ARE SYMPTOMS THAT SHOULD BE REPORTED IMMEDIATELY:  *FEVER GREATER THAN 100.5 F  *CHILLS WITH OR WITHOUT FEVER  NAUSEA AND VOMITING THAT IS NOT CONTROLLED WITH YOUR NAUSEA MEDICATION  *UNUSUAL SHORTNESS OF BREATH  *UNUSUAL BRUISING OR BLEEDING  TENDERNESS IN MOUTH AND THROAT WITH OR WITHOUT PRESENCE OF ULCERS  *URINARY PROBLEMS  *BOWEL PROBLEMS  UNUSUAL RASH Items with * indicate a potential emergency and should be followed up as soon as possible.  Feel free to call the clinic should you have any questions or concerns. The clinic phone number is [phone].  Please show the CHEMO ALERT CARD at check-in to the Emergency Department and triage nurse.   

## 2018-01-04 NOTE — Progress Notes (Signed)
[facility] OFFICE PROGRESS NOTE  Patient Care Team: [name], MD as PCP - General (Family Medicine) Reynold Bowen, MD as Consulting Physician (Endocrinology)  ASSESSMENT & PLAN:  Endometrial cancer [facility]) She tolerated treatment well without major side effects except for hypertension which is well controlled with current antihypertensives I recommend we continue treatment indefinitely if possible I plan to repeat imaging study again around end of August 2019  Essential hypertension Her blood pressure is elevated today likely due to anxiety I recommend close monitoring of blood pressure and to continue her blood pressure medication as directed If her blood pressure is uncontrolled, she will call me  Leukopenia due to antineoplastic chemotherapy [facility]) She has chronic leukopenia Observe only She is not symptomatic  Anxiety about health She was started on some lorazepam for anxiety I think it is a reasonable approach She is also on Effexor I reassured the patient   No orders of the defined types were placed in this encounter.   INTERVAL HISTORY: Please see below for problem oriented charting. She returns for further follow-up She feels well from treatment No new lymphadenopathy No recent bleeding She has not checked her blood pressure lately She denies headache or blurriness of vision She complained of mild recent anxiety and was started on lorazepam No recent infection, fever or chills  SUMMARY OF ONCOLOGIC HISTORY: Oncology History   [employer] testing done 11-2015 on surgical path from 2014: MS stable TMB low 4 muts/mb ATM [id number] ERBB3 T389K E2H2 rearrangement exon 9 PPP2R1A P179R TP53 [id number]    ER- APPROXIMATELY 25-35% STAINING IN NEOPLASTIC CELLS (INTERMEDIATE)  PR- APPROXIMATELY 25-35% STAINING IN NEOPLASTIC CELLS (STRONG)  Repeat biopsy 04/02/17: ER negative, Her 2 negative     Endometrial cancer ([facility])   06/20/2012  Pathology Results    Biopsy positive for papillary serous carcinoma      06/20/2012 Genetic Testing    [employer] testing done 11-2015 on surgical path from 2014: MS stable TMB low 4 muts/mb ATM [id number] ERBB3 T389K E2H2 rearrangement exon 9 PPP2R1A P179R TP53 [id number]       06/20/2012 Initial Diagnosis    Patient presented to PCP with intermittent vaginal bleeding since ~ Oct 2013, endometrial biopsy 06-20-12 with complex endometrial hyperplasia with atypia      06/26/2012 Imaging    Thickened endometrial lining in a postmenopausal patient experiencing vaginal bleeding. In the setting of post-menopausal bleeding, endometrial sampling is indicated to exclude carcinoma. No focal myometrial abnormalities are seen.  Normal left ovary and non-visualized right ovary      07/30/2012 Surgery    Dr. Polly Cobia performed robotic hysterectomy with bilateral salpingo-oophorectomy, bilateral pelvic lymph node dissection and periaortic lymph node dissection. Intraoperatively on frozen section, the patient was noted to have a large endometrial polyp with changes within the polyp consistent for high-grade malignancy, possibly papillary serous carcinoma. There is no obvious extrauterine disease noted.        07/30/2012 Pathology Results    [phone] SUPPLEMENTAL REPORT  THE ENDOMETRIAL CARCINOMA WAS ANALYZED FOR DNA MISMATCH REPAIR PROTEINS.IMMUNOHISTOCHEMICALLY, THE NEOPLASM RETAINED NUCLEAR EXPRESSION OF 4 GENE PRODUCTS, MLH1, MSH2, MSH6, AND PMS2, INVOLVED IN DNA MISMATCH REPAIR.POSITIVE AND NEGATIVE CONTROLS WORKED APPROPRIATELY.  PER REQUEST, AN ER AND PR ARE PERFORMED ON BLOCK 1I.  ER- APPROXIMATELY 25-35% STAINING IN NEOPLASTIC CELLS (INTERMEDIATE)  PR- APPROXIMATELY 25-35% STAINING IN NEOPLASTIC CELLS (STRONG)  ER AND PR PREDOMINANTLY SHOW STAINING IN THE SEROUS COMPONENT.  DIAGNOSIS  1. UTERUS, CERVIX WITH BILATERAL FALLOPIAN TUBES  AND OVARIES,  HYSTERECTOMY AND  BILATERAL SALPINGO-OOPHORECTOMY:  HIGH GRADE/POORLY DIFFERENTIATED ENDOMETRIAL ADENOCARCINOMA WITH SOLID AND SEROUS PAPILLARY COMPONENTS.  HISTOPATHOLOGIC TYPE:A VARIETY OF PATTERNS WERE PRESENT, ALL OF WHICH SHOULD BE CONSIDERED TO BE HIGH GRADE.SEROUS PAPILLARY CARCINOMA WAS SEEN OCCURRING ADJACENT TO SOLID ENDOMETRIAL CARCINOMA WITH MARKED ANAPLASIA AND GIANT CELLS. SIZE:TUMOR MEASURED AT LEAST 4.8 CM IN GREATEST HORIZONTAL DIMENSION.  GRADE: POORLY DIFFERENTIATED OR HIGH GRADE. DEPTH OF INVASION: NO DEFINITE MYOMETRIAL INVOLVEMENT WAS SEEN.THE TUMOR APPEARED CONFINED TO THE POLYP AS WELL AS SURFACE ENDOMETRIUM. WHERE MYOMETRIAL THICKNESS NOT APPLICABLE. SEROSAL INVOLVEMENT: NOT DEMONSTRATED.  ENDOCERVICAL INVOLVEMENT:NOT DEMONSTRATED. RESECTION MARGINS: FREE OF INVOLVEMENT. EXTRAUTERINE EXTENSION:NOT DEMONSTRATED. ANGIOLYMPHATIC INVASION: NOT DEFINITELY SEEN. TOTAL NODES EXAMINED:22.  PELVIC NODES EXAMINED:20.  PELVIC NODES INVOLVED:0.  PARA-AORTIC NODES 2. EXAMINED:  PARA-AORTIC NODES 0. INVOLVED TNM STAGE: T1A N0 MX. AJCC STAGE GROUPING: IA. FIGO STAGE:IA.      08/26/2012 Imaging    No CT evidence for intra-abdominal or pelvic metastatic disease. Trace free pelvic fluid, presumably postoperative although the date of surgery is not documented in the electronic medical record.      08/26/2012 - 12/13/2012 Chemotherapy    She received 6 cycles of carbo/taxol       10/29/2012 - 11/27/2012 Radiation Therapy    May 27, June 5,  June 11, June 19, November 27, 2012: Proximal vagina 30 Gy in 5 fractions        01/17/2013 Imaging    1.  No evidence of recurrent or metastatic disease. 2.  No acute abnormality involving the abdomen or pelvis. 3.  Mild diffuse hepatic steatosis. 4.  Very small supraumbilical midline anterior abdominal wall hernia containing fat, unchanged      01/22/2014  Imaging    No evidence for metastatic or recurrent disease. 2. No bowel obstruction.  Normal appendix. 3. Small fat containing hernia, stable in appearance. 4. Status post hysterectomy and bilateral oophorectomy      02/19/2014 Imaging    No pulmonary lesions are identified. 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 No other sites of metastatic disease identified within the abdomen or pelvis.        11/27/2014 - 02/11/2015 Chemotherapy    She received 4 cycles of carbo/taxol       03/01/2015 PET scan    Single hypermetabolic small retroperitoneal lymph node along the aorta. 2. No evidence of metastatic disease otherwise in the abdomen or pelvis. No evidence local recurrence. 3. Intensely hypermetabolic enlarged nodule adjacent to the RIGHT lobe of thyroid gland. This presumably represents the biopsied lesion in clinician report which was found to be benign thyroid tissue.      04/05/2015 - 05/13/2015 Radiation Therapy    She received 50.4 gray in 28 fractions with simultaneous integrated boost to 56 gray      06/17/2015 Imaging    No acute process or evidence of metastatic disease in the abdomen or pelvis. Resolution of previously described retroperitoneal adenopathy. 2.  Possible constipation. 3. Atherosclerosis.      06/17/2015 Tumor Marker    Patient's tumor was tested for the following markers: CA125 Results of the tumor marker test revealed 33      08/12/2015 Tumor Marker    Patient's tumor was tested for the following markers: CA125 Results of the tumor  marker test revealed 52      08/31/2015 PET scan    Development of right paratracheal hypermetabolic adenopathy, consistent with nodal metastasis. 2. The previously described isolated abdominal  retroperitoneal hypermetabolic node has resolved. 3. Persistent hypermetabolic right thyroid nodule, per report previously biopsied. Correlate with those results. 4.  Possible constipation.      09/09/2015 -  Anti-estrogen oral therapy    She has been receiving alternative treatment between megace and tamoxifen      09/09/2015 Tumor Marker    Patient's tumor was tested for the following markers: CA125 Results of the tumor marker test revealed 37.8      09/20/2015 Procedure    Technically successful ultrasound-guided thyroid aspiration biopsy , dominant right nodule      09/20/2015 Pathology Results    THYROID, RIGHT, FINE NEEDLE ASPIRATION (SPECIMEN 1 OF 1 COLLECTED 09/20/15): FINDINGS CONSISTENT WITH BENIGN THYROID NODULE (BETHESDA CATEGORY II).      10/28/2015 Tumor Marker    Patient's tumor was tested for the following markers: CA125 Results of the tumor marker test revealed 53.2      11/04/2015 Imaging    Stable benign right thyroid nodule      12/16/2015 Tumor Marker    Patient's tumor was tested for the following markers: CA125 Results of the tumor marker test revealed 38.1      03/22/2016 PET scan    Interval progression of hypermetabolic right paratracheal lymph node consistent with metastatic involvement. 2. Stable hypermetabolic right thyroid nodule. Reportedly this has been biopsied in the past.      03/23/2016 Tumor Marker    Patient's tumor was tested for the following markers: CA125 Results of the tumor marker test revealed 62.4      04/13/2016 - 06/01/2016 Radiation Therapy    She received 56 Gy to the chest in 28 fractions       05/09/2016 Imaging    No evidence of left lower extremity deep vein thrombosis. No evidence of a superficial thrombosis of the greater and lesser saphenous veins. Positive for thrombus noted in several varicosities of the calf. No evidence of Baker's cyst on the left.      05/17/2016 Tumor Marker    Patient's tumor was tested for the  following markers: CA125 Results of the tumor marker test revealed 9      06/29/2016 Tumor Marker    Patient's tumor was tested for the following markers: CA125 Results of the tumor marker test revealed 5.9      08/04/2016 Tumor Marker    Patient's tumor was tested for the following markers: CA125 Results of the tumor marker test revealed 6.0      09/12/2016 PET scan    Complete metabolic response to therapy, with resolution of hypermetabolic mediastinal lymphadenopathy since prior exam. No residual or new metastatic disease identified. Stable hypermetabolic right thyroid lobe nodule, which was previously biopsied on 09/20/2015.      03/13/2017 PET scan    1. Solitary focus of recurrent right paratracheal hypermetabolic activity, with a 1.0 cm right lower paratracheal node having a maximum SUV of 9.0. Appearance compatible with recurrent malignancy. 2. Continued hypermetabolic right thyroid nodule, previously biopsied, presumed benign -correlate with prior biopsy results. 3. Other imaging findings of potential clinical significance: Aortic Atherosclerosis (ICD10-I70.0). Mild cardiomegaly. Prominent stool throughout the colon favors constipation.      04/02/2017 Pathology Results    FINE NEEDLE ASPIRATION, ENDOSCOPIC, (EBUS) 4 R NODE (SPECIMEN 1 OF 2 COLLECTED 04/02/17): MALIGNANT CELLS PRESENT, CONSISTENT  WITH CARCINOMA. SEE COMMENT. COMMENT: THE MALIGNANT CELLS ARE POSITIVE FOR P53 AND NEGATIVE FOR ESTROGEN RECEPTOR AND TTF-1. THIS PROFILE IS NON-SPECIFIC, BUT THE P53 POSITIVE STAINING IS SUGGESTIVE OF GYNECOLOGIC PRIMARY.       04/11/2017 Procedure    Successful 8 French right internal jugular vein power port placement with its tip at the SVC/RA junction      04/12/2017 Imaging    Normal LV size with mild LV hypertrophy. EF 55-60%. Normal RV size and systolic function. Aortic valve sclerosis without significant stenosis.      04/18/2017 - 06/14/2017 Chemotherapy    The patient had  chemotherapy with Doxil       07/10/2017 Imaging    - Left ventricle: The cavity size was normal. There was mild concentric hypertrophy. Systolic function was normal. The estimated ejection fraction was in the range of 55% to 60%. Wall motion was normal; there were no regional wall motion abnormalities. Doppler parameters are consistent with abnormal left ventricular relaxation (grade 1 diastolic dysfunction). - Aortic valve: Noncoronary cusp mobility was mildly restricted. - Mitral valve: There was mild regurgitation. - Left atrium: The atrium was mildly dilated. - Atrial septum: No defect or patent foramen ovale was identified.  Impressions:  - Compared to November 2018, global LV longitudinal strain remains normal (has increased)      07/11/2017 PET scan    Increased size and hypermetabolic activity of 3.3 cm right thyroid lobe nodule. Thyroid carcinoma cannot be excluded. Recommend repeat ultrasound guided fine needle aspiration to exclude thyroid carcinoma.  New adjacent hypermetabolic 10 mm right supraclavicular lymph node, suspicious for lymph node metastasis.  Slight increase in size and hypermetabolic activity of solitary right paratracheal lymph node.  No evidence of abdominal or pelvic metastatic disease.      07/18/2017 Procedure    1. Technically successful ultrasound guided fine needle aspiration of indeterminate hypermetabolic right-sided thyroid nodule/mass. 2. Technically successful ultrasound-guided core needle biopsy of hypermetabolic right lower cervical lymph node.      07/18/2017 Pathology Results    THYROID, FINE NEEDLE ASPIRATION RIGHT (SPECIMEN 1 OF 1, COLLECTED ON 07/18/2017): ATYPIA OF UNDETERMINED SIGNIFICANCE OR FOLLICULAR LESION OF UNDETERMINED SIGNIFICANCE (BETHESDA CATEGORY III). SEE COMMENT. COMMENT: THE SPECIMEN CONSISTS OF SMALL AND MEDIUM SIZED GROUPS OF FOLLICULAR EPITHELIAL CELLS WITH MILD CYTOLOGIC ATYPIA INCLUDING NUCLEAR ENLARGEMENT AND HURTHLE  CELL CHANGE. SOME GROUPS ARE ARRANGED AS MICROFOLLICLES. THERE IS MINIMAL BACKGROUND COLLOID. BASED ON THESE FEATURES, A FOLLICULAR LESION/NEOPLASM CAN NOT BE ENTIRELY RULED OUT. A SPECIMEN WILL BE SENT FOR AFIRMA TESTING.      07/19/2017 Pathology Results    Lymph node, needle/core biopsy - METASTATIC PAPILLARY SEROUS CARCINOMA. - SEE COMMENT. Microscopic Comment Dr. Vicente Males has reviewed the case and concurs with this interpretation      10/15/2017 PET scan    No new or progressive disease. No evidence of abdominal or pelvic metastatic disease.  Stable hypermetabolic right thyroid lobe nodule and adjacent right supraclavicular lymph node.  Decreased size and hypermetabolic activity of solitary right paratracheal lymph node.       REVIEW OF SYSTEMS:   Constitutional: Denies fevers, chills or abnormal weight loss Eyes: Denies blurriness of vision Ears, nose, mouth, throat, and face: Denies mucositis or sore throat Respiratory: Denies cough, dyspnea or wheezes Cardiovascular: Denies palpitation, chest discomfort or lower extremity swelling Gastrointestinal:  Denies nausea, heartburn or change in bowel habits Skin: Denies abnormal skin rashes Lymphatics: Denies new lymphadenopathy or easy bruising Neurological:Denies numbness, tingling or new  weaknesses Behavioral/Psych: Mood is stable, no new changes  All other systems were reviewed with the patient and are negative.  I have reviewed the past medical history, past surgical history, social history and family history with the patient and they are unchanged from previous note.  ALLERGIES:  has No Known Allergies.  MEDICATIONS:  Current Outpatient Medications  Medication Sig Dispense Refill  . amLODipine (NORVASC) 10 MG tablet TAKE 1 TABLET BY MOUTH DAILY. DECREASE SIMVASTATIN TO 20MG DAILY WHILE ON AMLODIPINE PER MD 90 tablet 0  . Calcium Carb-Cholecalciferol 500-600 MG-UNIT TABS Take 1 tablet by mouth daily.     .  Cholecalciferol (VITAMIN D3) 2000 units TABS Take 2,000 Units by mouth daily.     Marland Kitchen LORazepam (ATIVAN) 0.5 MG tablet Take 0.5 mg by mouth 2 (two) times daily as needed.  0  . metoprolol tartrate (LOPRESSOR) 50 MG tablet TAKE 1 TABLET (50 MG TOTAL) BY MOUTH 2 (TWO) TIMES DAILY. 60 tablet 1  . simvastatin (ZOCOR) 40 MG tablet Take 40 mg by mouth every evening.    . tacrolimus (PROTOPIC) 0.1 % ointment Apply 1 application topically daily as needed (rash).     . venlafaxine XR (EFFEXOR-XR) 75 MG 24 hr capsule Take 75 mg by mouth daily with breakfast.      No current facility-administered medications for this visit.     PHYSICAL EXAMINATION: ECOG PERFORMANCE STATUS: 1 - Symptomatic but completely ambulatory  Vitals:   01/04/18 0851  BP: (!) 154/76  Pulse: 67  Resp: 18  Temp: 97.9 F (36.6 C)  SpO2: 100%   Filed Weights   01/04/18 0851  Weight: 158 lb (71.7 kg)    GENERAL:alert, no distress and comfortable SKIN: skin color, texture, turgor are normal, no rashes or significant lesions EYES: normal, Conjunctiva are pink and non-injected, sclera clear OROPHARYNX:no exudate, no erythema and lips, buccal mucosa, and tongue normal  NECK: supple, thyroid normal size, non-tender, without nodularity LYMPH:  no palpable lymphadenopathy in the cervical, axillary or inguinal LUNGS: clear to auscultation and percussion with normal breathing effort HEART: regular rate & rhythm and no murmurs and no lower extremity edema ABDOMEN:abdomen soft, non-tender and normal bowel sounds Musculoskeletal:no cyanosis of digits and no clubbing  NEURO: alert & oriented x 3 with fluent speech, no focal motor/sensory deficits  LABORATORY DATA:  I have reviewed the data as listed    Component Value Date/Time   NA 141 01/04/2018 0802   NA 141 05/17/2017 0957   K 3.8 01/04/2018 0802   K 3.6 05/17/2017 0957   CL 106 01/04/2018 0802   CL 105 11/19/2012 0848   CO2 26 01/04/2018 0802   CO2 25 05/17/2017 0957    GLUCOSE 95 01/04/2018 0802   GLUCOSE 85 05/17/2017 0957   GLUCOSE 122 (H) 11/19/2012 0848   BUN 22 01/04/2018 0802   BUN 15.2 05/17/2017 0957   CREATININE 0.79 01/04/2018 0802   CREATININE 0.8 05/17/2017 0957   CALCIUM 9.6 01/04/2018 0802   CALCIUM 9.6 05/17/2017 0957   PROT 6.8 01/04/2018 0802   PROT 7.0 05/17/2017 0957   ALBUMIN 3.7 01/04/2018 0802   ALBUMIN 3.9 05/17/2017 0957   AST 23 01/04/2018 0802   AST 25 05/17/2017 0957   ALT 21 01/04/2018 0802   ALT 18 05/17/2017 0957   ALKPHOS 53 01/04/2018 0802   ALKPHOS 57 05/17/2017 0957   BILITOT 0.5 01/04/2018 0802   BILITOT 0.47 05/17/2017 0957   GFRNONAA >60 01/04/2018 0802   GFRAA >60 01/04/2018  0802    No results found for: SPEP, UPEP  Lab Results  Component Value Date   WBC 3.1 (L) 01/04/2018   NEUTROABS 2.1 01/04/2018   HGB 12.3 01/04/2018   HCT 35.6 01/04/2018   MCV 95.3 01/04/2018   PLT 159 01/04/2018      Chemistry      Component Value Date/Time   NA 141 01/04/2018 0802   NA 141 05/17/2017 0957   K 3.8 01/04/2018 0802   K 3.6 05/17/2017 0957   CL 106 01/04/2018 0802   CL 105 11/19/2012 0848   CO2 26 01/04/2018 0802   CO2 25 05/17/2017 0957   BUN 22 01/04/2018 0802   BUN 15.2 05/17/2017 0957   CREATININE 0.79 01/04/2018 0802   CREATININE 0.8 05/17/2017 0957   GLU 158 (H) 01/14/2015 1035      Component Value Date/Time   CALCIUM 9.6 01/04/2018 0802   CALCIUM 9.6 05/17/2017 0957   ALKPHOS 53 01/04/2018 0802   ALKPHOS 57 05/17/2017 0957   AST 23 01/04/2018 0802   AST 25 05/17/2017 0957   ALT 21 01/04/2018 0802   ALT 18 05/17/2017 0957   BILITOT 0.5 01/04/2018 0802   BILITOT 0.47 05/17/2017 0957       All questions were answered. The patient knows to call the clinic with any problems, questions or concerns. No barriers to learning was detected.  I spent 15 minutes counseling the patient face to face. The total time spent in the appointment was 20 minutes and more than 50% was on counseling and  review of test results  [name], MD 01/04/2018 3:02 PM

## 2018-01-04 NOTE — Progress Notes (Signed)
OK to treat with today's BP and noted urine protein result per Dr Alvy Bimler.

## 2018-01-04 NOTE — Assessment & Plan Note (Signed)
She tolerated treatment well without major side effects except for hypertension which is well controlled with current antihypertensives I recommend we continue treatment indefinitely if possible I plan to repeat imaging study again around end of August 2019

## 2018-01-04 NOTE — Assessment & Plan Note (Signed)
She was started on some lorazepam for anxiety I think it is a reasonable approach She is also on Effexor I reassured the patient

## 2018-01-04 NOTE — Assessment & Plan Note (Signed)
Her blood pressure is elevated today likely due to anxiety I recommend close monitoring of blood pressure and to continue her blood pressure medication as directed If her blood pressure is uncontrolled, she will call me

## 2018-01-08 DIAGNOSIS — D72819 Decreased white blood cell count, unspecified: Secondary | ICD-10-CM | POA: Diagnosis not present

## 2018-01-08 DIAGNOSIS — Z6827 Body mass index (BMI) 27.0-27.9, adult: Secondary | ICD-10-CM | POA: Diagnosis not present

## 2018-01-08 DIAGNOSIS — E041 Nontoxic single thyroid nodule: Secondary | ICD-10-CM | POA: Diagnosis not present

## 2018-01-08 DIAGNOSIS — M859 Disorder of bone density and structure, unspecified: Secondary | ICD-10-CM | POA: Diagnosis not present

## 2018-01-08 DIAGNOSIS — C541 Malignant neoplasm of endometrium: Secondary | ICD-10-CM | POA: Diagnosis not present

## 2018-01-14 DIAGNOSIS — L308 Other specified dermatitis: Secondary | ICD-10-CM | POA: Diagnosis not present

## 2018-01-18 DIAGNOSIS — L309 Dermatitis, unspecified: Secondary | ICD-10-CM | POA: Diagnosis not present

## 2018-01-18 DIAGNOSIS — L232 Allergic contact dermatitis due to cosmetics: Secondary | ICD-10-CM | POA: Diagnosis not present

## 2018-01-18 MED FILL — TACROLIMUS 0.1 % OINT: 0.1 | 15 days supply | Qty: 60 | Fill #0

## 2018-01-21 ENCOUNTER — Other Ambulatory Visit: Payer: Self-pay | Admitting: Hematology and Oncology

## 2018-01-21 MED FILL — SIMVASTATIN 40 MG TABLET: 40 | 90 days supply | Qty: 90 | Fill #2

## 2018-01-21 MED FILL — METOPROLOL TARTRATE 50 MG T: 50 | 30 days supply | Qty: 60 | Fill #0

## 2018-01-23 ENCOUNTER — Inpatient Hospital Stay: Payer: 59

## 2018-01-23 ENCOUNTER — Ambulatory Visit (HOSPITAL_COMMUNITY)
Admission: RE | Admit: 2018-01-23 | Discharge: 2018-01-23 | Disposition: A | Discharge status: Home / Self Care | Payer: 59 | Source: Ambulatory Visit | Attending: Hematology and Oncology | Admitting: Hematology and Oncology

## 2018-01-23 DIAGNOSIS — Z95828 Presence of other vascular implants and grafts: Secondary | ICD-10-CM

## 2018-01-23 DIAGNOSIS — C541 Malignant neoplasm of endometrium: Secondary | ICD-10-CM

## 2018-01-23 DIAGNOSIS — C770 Secondary and unspecified malignant neoplasm of lymph nodes of head, face and neck: Secondary | ICD-10-CM | POA: Diagnosis not present

## 2018-01-23 DIAGNOSIS — D701 Agranulocytosis secondary to cancer chemotherapy: Secondary | ICD-10-CM | POA: Diagnosis not present

## 2018-01-23 DIAGNOSIS — I10 Essential (primary) hypertension: Secondary | ICD-10-CM | POA: Diagnosis not present

## 2018-01-23 DIAGNOSIS — R946 Abnormal results of thyroid function studies: Secondary | ICD-10-CM | POA: Insufficient documentation

## 2018-01-23 DIAGNOSIS — Z7189 Other specified counseling: Secondary | ICD-10-CM

## 2018-01-23 DIAGNOSIS — R590 Localized enlarged lymph nodes: Secondary | ICD-10-CM | POA: Diagnosis not present

## 2018-01-23 DIAGNOSIS — F418 Other specified anxiety disorders: Secondary | ICD-10-CM | POA: Diagnosis not present

## 2018-01-23 DIAGNOSIS — Z5112 Encounter for antineoplastic immunotherapy: Secondary | ICD-10-CM | POA: Diagnosis not present

## 2018-01-23 DIAGNOSIS — L309 Dermatitis, unspecified: Secondary | ICD-10-CM | POA: Diagnosis not present

## 2018-01-23 LAB — CBC WITH DIFFERENTIAL (CANCER CENTER ONLY)
Basophils Absolute: 0 10*3/uL (ref 0.0–0.1)
Basophils Relative: 2 %
Eosinophils Absolute: 0.1 10*3/uL (ref 0.0–0.5)
Eosinophils Relative: 5 %
HCT: 38 % (ref 34.8–46.6)
Hemoglobin: 12.8 g/dL (ref 11.6–15.9)
Lymphocytes Relative: 17 %
Lymphs Abs: 0.5 10*3/uL — ABNORMAL LOW (ref 0.9–3.3)
MCH: 32.4 pg (ref 25.1–34.0)
MCHC: 33.8 g/dL (ref 31.5–36.0)
MCV: 95.9 fL (ref 79.5–101.0)
Monocytes Absolute: 0.2 10*3/uL (ref 0.1–0.9)
Monocytes Relative: 8 %
Neutro Abs: 2 10*3/uL (ref 1.5–6.5)
Neutrophils Relative %: 68 %
Platelet Count: 183 10*3/uL (ref 145–400)
RBC: 3.96 MIL/uL (ref 3.70–5.45)
RDW: 12.9 % (ref 11.2–14.5)
WBC Count: 2.9 10*3/uL — ABNORMAL LOW (ref 3.9–10.3)

## 2018-01-23 LAB — COMPREHENSIVE METABOLIC PANEL
ALT: 25 U/L (ref 0–44)
AST: 26 U/L (ref 15–41)
Albumin: 3.8 g/dL (ref 3.5–5.0)
Alkaline Phosphatase: 56 U/L (ref 38–126)
Anion gap: 7 (ref 5–15)
BUN: 21 mg/dL (ref 8–23)
CO2: 28 mmol/L (ref 22–32)
Calcium: 9.5 mg/dL (ref 8.9–10.3)
Chloride: 107 mmol/L (ref 98–111)
Creatinine, Ser: 0.79 mg/dL (ref 0.44–1.00)
GFR calc Af Amer: >60 mL/min (ref >60)
GFR calc non Af Amer: >60 mL/min (ref >60)
Glucose, Bld: 92 mg/dL (ref 70–99)
Potassium: 4.1 mmol/L (ref 3.5–5.1)
Sodium: 142 mmol/L (ref 135–145)
Total Bilirubin: 0.5 mg/dL (ref 0.3–1.2)
Total Protein: 7.1 g/dL (ref 6.5–8.1)

## 2018-01-23 LAB — GLUCOSE, CAPILLARY: Glucose-Capillary: 69 mg/dL — ABNORMAL LOW (ref 70–99)

## 2018-01-23 LAB — TOTAL PROTEIN, URINE DIPSTICK: Protein, ur: 30 mg/dL — AB

## 2018-01-23 MED ORDER — SODIUM CHLORIDE 0.9% FLUSH
10.0000 mL | Freq: Once | INTRAVENOUS | Status: AC
  Administered 2018-01-23: 10 mL
  Filled 2018-01-23: qty 10

## 2018-01-23 MED ORDER — HEPARIN SOD (PORK) LOCK FLUSH 100 UNIT/ML IV SOLN
500.0000 [IU] | Freq: Once | INTRAVENOUS | Status: AC
  Administered 2018-01-23: 500 [IU]
  Filled 2018-01-23: qty 5

## 2018-01-23 MED ORDER — FLUDEOXYGLUCOSE F - 18 (FDG) INJECTION
7.7300 | Freq: Once | INTRAVENOUS | Status: AC | PRN
  Administered 2018-01-23: 7.73 via INTRAVENOUS

## 2018-01-24 ENCOUNTER — Inpatient Hospital Stay (HOSPITAL_BASED_OUTPATIENT_CLINIC_OR_DEPARTMENT_OTHER): Payer: 59 | Admitting: Hematology and Oncology

## 2018-01-24 ENCOUNTER — Encounter: Payer: Self-pay | Admitting: Hematology and Oncology

## 2018-01-24 ENCOUNTER — Inpatient Hospital Stay: Payer: 59

## 2018-01-24 ENCOUNTER — Telehealth: Payer: Self-pay | Admitting: Hematology and Oncology

## 2018-01-24 VITALS — BP 141/81

## 2018-01-24 VITALS — BP 139/77 | HR 73 | Temp 98.2°F | Resp 17 | Ht 63.0 in | Wt 159.8 lb

## 2018-01-24 DIAGNOSIS — L309 Dermatitis, unspecified: Secondary | ICD-10-CM | POA: Diagnosis not present

## 2018-01-24 DIAGNOSIS — Z923 Personal history of irradiation: Secondary | ICD-10-CM

## 2018-01-24 DIAGNOSIS — F418 Other specified anxiety disorders: Secondary | ICD-10-CM

## 2018-01-24 DIAGNOSIS — Z7189 Other specified counseling: Secondary | ICD-10-CM

## 2018-01-24 DIAGNOSIS — R590 Localized enlarged lymph nodes: Secondary | ICD-10-CM | POA: Diagnosis not present

## 2018-01-24 DIAGNOSIS — D701 Agranulocytosis secondary to cancer chemotherapy: Secondary | ICD-10-CM

## 2018-01-24 DIAGNOSIS — C541 Malignant neoplasm of endometrium: Secondary | ICD-10-CM | POA: Diagnosis not present

## 2018-01-24 DIAGNOSIS — C770 Secondary and unspecified malignant neoplasm of lymph nodes of head, face and neck: Secondary | ICD-10-CM

## 2018-01-24 DIAGNOSIS — I10 Essential (primary) hypertension: Secondary | ICD-10-CM | POA: Diagnosis not present

## 2018-01-24 DIAGNOSIS — Z5112 Encounter for antineoplastic immunotherapy: Secondary | ICD-10-CM | POA: Diagnosis not present

## 2018-01-24 MED ORDER — SODIUM CHLORIDE 0.9 % IV SOLN
Freq: Once | INTRAVENOUS | Status: AC
  Administered 2018-01-24: 14:00:00 via INTRAVENOUS
  Filled 2018-01-24: qty 250

## 2018-01-24 MED ORDER — SODIUM CHLORIDE 0.9% FLUSH
10.0000 mL | INTRAVENOUS | Status: DC | PRN
  Filled 2018-01-24: qty 10

## 2018-01-24 MED ORDER — HYDROCHLOROTHIAZIDE 25 MG PO TABS: 25.0000 mg | ORAL_TABLET | Freq: Every day | ORAL | 9 refills | Status: DC

## 2018-01-24 MED ORDER — SODIUM CHLORIDE 0.9 % IV SOLN
14.8000 mg/kg | Freq: Once | INTRAVENOUS | Status: AC
  Administered 2018-01-24: 1100 mg via INTRAVENOUS
  Filled 2018-01-24: qty 12

## 2018-01-24 MED ORDER — HEPARIN SOD (PORK) LOCK FLUSH 100 UNIT/ML IV SOLN
500.0000 [IU] | Freq: Once | INTRAVENOUS | Status: DC | PRN
  Filled 2018-01-24: qty 5

## 2018-01-24 MED FILL — HYDROCHLOROTHIAZIDE 25 MG T: 25 | 90 days supply | Qty: 90 | Fill #0

## 2018-01-24 NOTE — Assessment & Plan Note (Signed)
This is stable and not progressive Monitor closely.

## 2018-01-24 NOTE — Assessment & Plan Note (Signed)
She has verbalize indication she would like to see a psychiatrist/psychologist to address her symptoms of anxiety I agree and encouraged her to do so

## 2018-01-24 NOTE — Assessment & Plan Note (Signed)
Her blood pressure is not adequately controlled I recommend hydrochlorothiazide in addition to her current prescription antihypertensives

## 2018-01-24 NOTE — Patient Instructions (Signed)
[facility] Discharge Instructions for Patients Receiving Chemotherapy  Today you received the following chemotherapy agents Avastin  To help prevent nausea and vomiting after your treatment, we encourage you to take your nausea medication as directed   If you develop nausea and vomiting that is not controlled by your nausea medication, call the clinic.   BELOW ARE SYMPTOMS THAT SHOULD BE REPORTED IMMEDIATELY:  *FEVER GREATER THAN 100.5 F  *CHILLS WITH OR WITHOUT FEVER  NAUSEA AND VOMITING THAT IS NOT CONTROLLED WITH YOUR NAUSEA MEDICATION  *UNUSUAL SHORTNESS OF BREATH  *UNUSUAL BRUISING OR BLEEDING  TENDERNESS IN MOUTH AND THROAT WITH OR WITHOUT PRESENCE OF ULCERS  *URINARY PROBLEMS  *BOWEL PROBLEMS  UNUSUAL RASH Items with * indicate a potential emergency and should be followed up as soon as possible.  Feel free to call the clinic should you have any questions or concerns. The clinic phone number is [phone].  Please show the CHEMO ALERT CARD at check-in to the Emergency Department and triage nurse.   

## 2018-01-24 NOTE — Telephone Encounter (Signed)
Gave avs and calendar ° °

## 2018-01-24 NOTE — Assessment & Plan Note (Signed)
I have reviewed recent imaging study with her and her husband extensively Overall, she has stable disease We will continue treatment indefinitely I plan to repeat imaging study again in 3 months

## 2018-01-24 NOTE — Progress Notes (Signed)
[facility] OFFICE PROGRESS NOTE  Patient Care Team: [name], MD as PCP - General (Family Medicine) Reynold Bowen, MD as Consulting Physician (Endocrinology)  ASSESSMENT & PLAN:  Endometrial cancer [facility]) I have reviewed recent imaging study with her and her husband extensively Overall, she has stable disease We will continue treatment indefinitely I plan to repeat imaging study again in 3 months  Essential hypertension Her blood pressure is not adequately controlled I recommend hydrochlorothiazide in addition to her current prescription antihypertensives  Anxiety about health She has verbalize indication she would like to see a psychiatrist/psychologist to address her symptoms of anxiety I agree and encouraged her to do so  Dermatitis She is seeing dermatologist for this I recommend avoidance of excessive sun exposure  Mediastinal lymphadenopathy This is stable and not progressive Monitor closely.   Orders Placed This Encounter  Procedures  . Comprehensive metabolic panel    Standing Status:   Standing    Number of Occurrences:   22    Standing Expiration Date:   01/25/2019    INTERVAL HISTORY: Please see below for problem oriented charting. She returns for further follow-up with her husband She has significant anxiety due to concern for her cancer situation She has poorly controlled hypertension in the background She has seen dermatologist recently for her skin rash and is seems to be getting a little worse Denies leg swelling No recent chest pain or shortness of breath Denies recent infection, fever or chills  SUMMARY OF ONCOLOGIC HISTORY: Oncology History   [employer] testing done 11-2015 on surgical path from 2014: MS stable TMB low 4 muts/mb ATM [id number] ERBB3 T389K E2H2 rearrangement exon 9 PPP2R1A P179R TP53 [id number]    ER- APPROXIMATELY 25-35% STAINING IN NEOPLASTIC CELLS (INTERMEDIATE)  PR- APPROXIMATELY 25-35%  STAINING IN NEOPLASTIC CELLS (STRONG)  Repeat biopsy 04/02/17: ER negative, Her 2 negative     Endometrial cancer ([place])   06/20/2012 Pathology Results    Biopsy positive for papillary serous carcinoma    06/20/2012 Genetic Testing    [employer] One testing done 11-2015 on surgical path from 2014: MS stable TMB low 4 muts/mb ATM [id number] ERBB3 T389K E2H2 rearrangement exon 9 PPP2R1A P179R TP53 I195N     06/20/2012 Initial Diagnosis    Patient presented to PCP with intermittent vaginal bleeding since ~ Oct 2013, endometrial biopsy 06-20-12 with complex endometrial hyperplasia with atypia    06/26/2012 Imaging    Thickened endometrial lining in a postmenopausal patient experiencing vaginal bleeding. In the setting of post-menopausal bleeding, endometrial sampling is indicated to exclude carcinoma. No focal myometrial abnormalities are seen.  Normal left ovary and non-visualized right ovary    07/30/2012 Surgery    Dr. Polly Cobia performed robotic hysterectomy with bilateral salpingo-oophorectomy, bilateral pelvic lymph node dissection and periaortic lymph node dissection. Intraoperatively on frozen section, the patient was noted to have a large endometrial polyp with changes within the polyp consistent for high-grade malignancy, possibly papillary serous carcinoma. There is no obvious extrauterine disease noted.      07/30/2012 Pathology Results    [phone] SUPPLEMENTAL REPORT  THE ENDOMETRIAL CARCINOMA WAS ANALYZED FOR DNA MISMATCH REPAIR PROTEINS.IMMUNOHISTOCHEMICALLY, THE NEOPLASM RETAINED NUCLEAR EXPRESSION OF 4 GENE PRODUCTS, MLH1, MSH2, MSH6, AND PMS2, INVOLVED IN DNA MISMATCH REPAIR.POSITIVE AND NEGATIVE CONTROLS WORKED APPROPRIATELY.  PER REQUEST, AN ER AND PR ARE PERFORMED ON BLOCK 1I.  ER- APPROXIMATELY 25-35% STAINING IN NEOPLASTIC CELLS (INTERMEDIATE)  PR- APPROXIMATELY 25-35% STAINING IN NEOPLASTIC CELLS (STRONG)  ER AND PR PREDOMINANTLY  SHOW STAINING IN  THE SEROUS COMPONENT.  DIAGNOSIS  1. UTERUS, CERVIX WITH BILATERAL FALLOPIAN TUBES AND OVARIES,  HYSTERECTOMY AND BILATERAL SALPINGO-OOPHORECTOMY:  HIGH GRADE/POORLY DIFFERENTIATED ENDOMETRIAL ADENOCARCINOMA WITH SOLID AND SEROUS PAPILLARY COMPONENTS.  HISTOPATHOLOGIC TYPE:A VARIETY OF PATTERNS WERE PRESENT, ALL OF WHICH SHOULD BE CONSIDERED TO BE HIGH GRADE.SEROUS PAPILLARY CARCINOMA WAS SEEN OCCURRING ADJACENT TO SOLID ENDOMETRIAL CARCINOMA WITH MARKED ANAPLASIA AND GIANT CELLS. SIZE:TUMOR MEASURED AT LEAST 4.8 CM IN GREATEST HORIZONTAL DIMENSION.  GRADE: POORLY DIFFERENTIATED OR HIGH GRADE. DEPTH OF INVASION: NO DEFINITE MYOMETRIAL INVOLVEMENT WAS SEEN.THE TUMOR APPEARED CONFINED TO THE POLYP AS WELL AS SURFACE ENDOMETRIUM. WHERE MYOMETRIAL THICKNESS NOT APPLICABLE. SEROSAL INVOLVEMENT: NOT DEMONSTRATED.  ENDOCERVICAL INVOLVEMENT:NOT DEMONSTRATED. RESECTION MARGINS: FREE OF INVOLVEMENT. EXTRAUTERINE EXTENSION:NOT DEMONSTRATED. ANGIOLYMPHATIC INVASION: NOT DEFINITELY SEEN. TOTAL NODES EXAMINED:22.  PELVIC NODES EXAMINED:20.  PELVIC NODES INVOLVED:0.  PARA-AORTIC NODES 2. EXAMINED:  PARA-AORTIC NODES 0. INVOLVED TNM STAGE: T1A N0 MX. AJCC STAGE GROUPING: IA. FIGO STAGE:IA.    08/26/2012 Imaging    No CT evidence for intra-abdominal or pelvic metastatic disease. Trace free pelvic fluid, presumably postoperative although the date of surgery is not documented in the electronic medical record.    08/26/2012 - 12/13/2012 Chemotherapy    She received 6 cycles of carbo/taxol     10/29/2012 - 11/27/2012 Radiation Therapy    May 27, June 5,  June 11, June 19, November 27, 2012: Proximal vagina 30 Gy in 5 fractions      01/17/2013 Imaging    1.  No evidence of recurrent or metastatic disease. 2.  No acute abnormality involving the abdomen or pelvis. 3.  Mild diffuse hepatic steatosis. 4.  Very small  supraumbilical midline anterior abdominal wall hernia containing fat, unchanged    01/22/2014 Imaging    No evidence for metastatic or recurrent disease. 2. No bowel obstruction.  Normal appendix. 3. Small fat containing hernia, stable in appearance. 4. Status post hysterectomy and bilateral oophorectomy    02/19/2014 Imaging    No pulmonary lesions are identified. The abnormality on the chest x-ray is due to asymmetric left-sided sternoclavicular joint degenerative disease    10/12/2014 Imaging    New mild retroperitoneal lymphadenopathy in the left paraaortic region and proximal left common iliac chain, consistent with metastatic disease. No other sites of metastatic disease identified within the abdomen or pelvis.      11/27/2014 - 02/11/2015 Chemotherapy    She received 4 cycles of carbo/taxol     03/01/2015 PET scan    Single hypermetabolic small retroperitoneal lymph node along the aorta. 2. No evidence of metastatic disease otherwise in the abdomen or pelvis. No evidence local recurrence. 3. Intensely hypermetabolic enlarged nodule adjacent to the RIGHT lobe of thyroid gland. This presumably represents the biopsied lesion in clinician report which was found to be benign thyroid tissue.    04/05/2015 - 05/13/2015 Radiation Therapy    She received 50.4 gray in 28 fractions with simultaneous integrated boost to 56 gray    06/17/2015 Imaging    No acute process or evidence of metastatic disease in the abdomen or pelvis. Resolution of previously described retroperitoneal adenopathy. 2.  Possible constipation. 3. Atherosclerosis.    06/17/2015 Tumor Marker    Patient's tumor was tested for the following markers: CA125 Results of the tumor marker test revealed 33    08/12/2015 Tumor Marker    Patient's tumor was tested for the following markers: CA125 Results of the tumor marker test revealed 52    08/31/2015 PET scan  Development of right paratracheal hypermetabolic adenopathy, consistent  with nodal metastasis. 2. The previously described isolated abdominal retroperitoneal hypermetabolic node has resolved. 3. Persistent hypermetabolic right thyroid nodule, per report previously biopsied. Correlate with those results. 4.  Possible constipation.    09/09/2015 -  Anti-estrogen oral therapy    She has been receiving alternative treatment between megace and tamoxifen    09/09/2015 Tumor Marker    Patient's tumor was tested for the following markers: CA125 Results of the tumor marker test revealed 37.8    09/20/2015 Procedure    Technically successful ultrasound-guided thyroid aspiration biopsy , dominant right nodule    09/20/2015 Pathology Results    THYROID, RIGHT, FINE NEEDLE ASPIRATION (SPECIMEN 1 OF 1 COLLECTED 09/20/15): FINDINGS CONSISTENT WITH BENIGN THYROID NODULE (BETHESDA CATEGORY II).    10/28/2015 Tumor Marker    Patient's tumor was tested for the following markers: CA125 Results of the tumor marker test revealed 53.2    11/04/2015 Imaging    Stable benign right thyroid nodule    12/16/2015 Tumor Marker    Patient's tumor was tested for the following markers: CA125 Results of the tumor marker test revealed 38.1    03/22/2016 PET scan    Interval progression of hypermetabolic right paratracheal lymph node consistent with metastatic involvement. 2. Stable hypermetabolic right thyroid nodule. Reportedly this has been biopsied in the past.    03/23/2016 Tumor Marker    Patient's tumor was tested for the following markers: CA125 Results of the tumor marker test revealed 62.4    04/13/2016 - 06/01/2016 Radiation Therapy    She received 56 Gy to the chest in 28 fractions     05/09/2016 Imaging    No evidence of left lower extremity deep vein thrombosis. No evidence of a superficial thrombosis of the greater and lesser saphenous veins. Positive for thrombus noted in several varicosities of the calf. No evidence of Baker's cyst on the left.    05/17/2016 Tumor Marker     Patient's tumor was tested for the following markers: CA125 Results of the tumor marker test revealed 9    06/29/2016 Tumor Marker    Patient's tumor was tested for the following markers: CA125 Results of the tumor marker test revealed 5.9    08/04/2016 Tumor Marker    Patient's tumor was tested for the following markers: CA125 Results of the tumor marker test revealed 6.0    09/12/2016 PET scan    Complete metabolic response to therapy, with resolution of hypermetabolic mediastinal lymphadenopathy since prior exam. No residual or new metastatic disease identified. Stable hypermetabolic right thyroid lobe nodule, which was previously biopsied on 09/20/2015.    03/13/2017 PET scan    1. Solitary focus of recurrent right paratracheal hypermetabolic activity, with a 1.0 cm right lower paratracheal node having a maximum SUV of 9.0. Appearance compatible with recurrent malignancy. 2. Continued hypermetabolic right thyroid nodule, previously biopsied, presumed benign -correlate with prior biopsy results. 3. Other imaging findings of potential clinical significance: Aortic Atherosclerosis (ICD10-I70.0). Mild cardiomegaly. Prominent stool throughout the colon favors constipation.    04/02/2017 Pathology Results    FINE NEEDLE ASPIRATION, ENDOSCOPIC, (EBUS) 4 R NODE (SPECIMEN 1 OF 2 COLLECTED 04/02/17): MALIGNANT CELLS PRESENT, CONSISTENT WITH CARCINOMA. SEE COMMENT. COMMENT: THE MALIGNANT CELLS ARE POSITIVE FOR P53 AND NEGATIVE FOR ESTROGEN RECEPTOR AND TTF-1. THIS PROFILE IS NON-SPECIFIC, BUT THE P53 POSITIVE STAINING IS SUGGESTIVE OF GYNECOLOGIC PRIMARY.     04/11/2017 Procedure    Successful 8 French right internal jugular vein  power port placement with its tip at the SVC/RA junction    04/12/2017 Imaging    Normal LV size with mild LV hypertrophy. EF 55-60%. Normal RV size and systolic function. Aortic valve sclerosis without significant stenosis.    04/18/2017 - 06/14/2017 Chemotherapy    The  patient had chemotherapy with Doxil     07/10/2017 Imaging    - Left ventricle: The cavity size was normal. There was mild concentric hypertrophy. Systolic function was normal. The estimated ejection fraction was in the range of 55% to 60%. Wall motion was normal; there were no regional wall motion abnormalities. Doppler parameters are consistent with abnormal left ventricular relaxation (grade 1 diastolic dysfunction). - Aortic valve: Noncoronary cusp mobility was mildly restricted. - Mitral valve: There was mild regurgitation. - Left atrium: The atrium was mildly dilated. - Atrial septum: No defect or patent foramen ovale was identified.  Impressions:  - Compared to November 2018, global LV longitudinal strain remains normal (has increased)    07/11/2017 PET scan    Increased size and hypermetabolic activity of 3.3 cm right thyroid lobe nodule. Thyroid carcinoma cannot be excluded. Recommend repeat ultrasound guided fine needle aspiration to exclude thyroid carcinoma.  New adjacent hypermetabolic 10 mm right supraclavicular lymph node, suspicious for lymph node metastasis.  Slight increase in size and hypermetabolic activity of solitary right paratracheal lymph node.  No evidence of abdominal or pelvic metastatic disease.    07/18/2017 Procedure    1. Technically successful ultrasound guided fine needle aspiration of indeterminate hypermetabolic right-sided thyroid nodule/mass. 2. Technically successful ultrasound-guided core needle biopsy of hypermetabolic right lower cervical lymph node.    07/18/2017 Pathology Results    THYROID, FINE NEEDLE ASPIRATION RIGHT (SPECIMEN 1 OF 1, COLLECTED ON 07/18/2017): ATYPIA OF UNDETERMINED SIGNIFICANCE OR FOLLICULAR LESION OF UNDETERMINED SIGNIFICANCE (BETHESDA CATEGORY III). SEE COMMENT. COMMENT: THE SPECIMEN CONSISTS OF SMALL AND MEDIUM SIZED GROUPS OF FOLLICULAR EPITHELIAL CELLS WITH MILD CYTOLOGIC ATYPIA INCLUDING NUCLEAR ENLARGEMENT AND HURTHLE  CELL CHANGE. SOME GROUPS ARE ARRANGED AS MICROFOLLICLES. THERE IS MINIMAL BACKGROUND COLLOID. BASED ON THESE FEATURES, A FOLLICULAR LESION/NEOPLASM CAN NOT BE ENTIRELY RULED OUT. A SPECIMEN WILL BE SENT FOR AFIRMA TESTING.    07/19/2017 Pathology Results    Lymph node, needle/core biopsy - METASTATIC PAPILLARY SEROUS CARCINOMA. - SEE COMMENT. Microscopic Comment Dr. Vicente Males has reviewed the case and concurs with this interpretation    10/15/2017 PET scan    No new or progressive disease. No evidence of abdominal or pelvic metastatic disease.  Stable hypermetabolic right thyroid lobe nodule and adjacent right supraclavicular lymph node.  Decreased size and hypermetabolic activity of solitary right paratracheal lymph node.    01/23/2018 PET scan    1. Overall, no significant change from PET-CT of 3 months ago. There is persistent hypermetabolic activity at the right thoracic inlet and within a right paratracheal mediastinal node. The nodes have not significantly changed in size, although the metabolic activity has minimally increased over this interval. It is uncertain how much of the thoracic inlet metabolic activity is attributed to the thyroid nodule versus adjacent cervical lymph nodes, both previously biopsied. 2. No new hypermetabolic activity within the neck, chest, abdomen or pelvis. No evidence of local recurrence in the pelvis. 3. Stable probable radiation changes in the right lung.     REVIEW OF SYSTEMS:   Constitutional: Denies fevers, chills or abnormal weight loss Eyes: Denies blurriness of vision Ears, nose, mouth, throat, and face: Denies mucositis or sore throat Respiratory: Denies  cough, dyspnea or wheezes Cardiovascular: Denies palpitation, chest discomfort or lower extremity swelling Gastrointestinal:  Denies nausea, heartburn or change in bowel habits Lymphatics: Denies new lymphadenopathy or easy bruising Neurological:Denies numbness, tingling or new  weaknesses All other systems were reviewed with the patient and are negative.  I have reviewed the past medical history, past surgical history, social history and family history with the patient and they are unchanged from previous note.  ALLERGIES:  has No Known Allergies.  MEDICATIONS:  Current Outpatient Medications  Medication Sig Dispense Refill  . amLODipine (NORVASC) 10 MG tablet TAKE 1 TABLET BY MOUTH DAILY. DECREASE SIMVASTATIN TO '20MG'$  DAILY WHILE ON AMLODIPINE PER MD 90 tablet 0  . Calcium Carb-Cholecalciferol 500-600 MG-UNIT TABS Take 1 tablet by mouth daily.     . Cholecalciferol (VITAMIN D3) 2000 units TABS Take 2,000 Units by mouth daily.     . hydrochlorothiazide (HYDRODIURIL) 25 MG tablet Take 1 tablet (25 mg total) by mouth daily. 30 tablet 9  . LORazepam (ATIVAN) 0.5 MG tablet Take 0.5 mg by mouth 2 (two) times daily as needed.  0  . metoprolol tartrate (LOPRESSOR) 50 MG tablet TAKE 1 TABLET (50 MG TOTAL) BY MOUTH 2 (TWO) TIMES DAILY. 60 tablet 1  . simvastatin (ZOCOR) 40 MG tablet Take 40 mg by mouth every evening.    . tacrolimus (PROTOPIC) 0.1 % ointment Apply 1 application topically daily as needed (rash).     . venlafaxine XR (EFFEXOR-XR) 75 MG 24 hr capsule Take 75 mg by mouth daily with breakfast.      No current facility-administered medications for this visit.    Facility-Administered Medications Ordered in Other Visits  Medication Dose Route Frequency Provider Last Rate Last Dose  . 0.9 %  sodium chloride infusion   Intravenous Once Alvy Bimler, Bessie Boyte, MD      . bevacizumab (AVASTIN) 1,100 mg in sodium chloride 0.9 % 100 mL chemo infusion  14.8 mg/kg (Treatment Plan Recorded) Intravenous Once Alvy Bimler, Willmar Stockinger, MD      . heparin lock flush 100 unit/mL  500 Units Intracatheter Once PRN Alvy Bimler, Kori Goins, MD      . sodium chloride flush (NS) 0.9 % injection 10 mL  10 mL Intracatheter PRN Alvy Bimler, Sumaiyah Markert, MD        PHYSICAL EXAMINATION: ECOG PERFORMANCE STATUS: 1 - Symptomatic but  completely ambulatory  Vitals:   01/24/18 1146  BP: 139/77  Pulse: 73  Resp: 17  Temp: 98.2 F (36.8 C)  SpO2: 100%   Filed Weights   01/24/18 1146  Weight: 159 lb 12.8 oz (72.5 kg)    GENERAL:alert, no distress and comfortable SKIN: She has significant skin rash on her face and neck EYES: normal, Conjunctiva are pink and non-injected, sclera clear OROPHARYNX:no exudate, no erythema and lips, buccal mucosa, and tongue normal  NECK: supple, thyroid normal size, non-tender, without nodularity LYMPH:  no palpable lymphadenopathy in the cervical, axillary or inguinal LUNGS: clear to auscultation and percussion with normal breathing effort HEART: regular rate & rhythm and no murmurs and no lower extremity edema ABDOMEN:abdomen soft, non-tender and normal bowel sounds Musculoskeletal:no cyanosis of digits and no clubbing  NEURO: alert & oriented x 3 with fluent speech, no focal motor/sensory deficits  LABORATORY DATA:  I have reviewed the data as listed    Component Value Date/Time   NA 142 01/23/2018 0947   NA 141 05/17/2017 0957   K 4.1 01/23/2018 0947   K 3.6 05/17/2017 0957   CL 107 01/23/2018 0947  CL 105 11/19/2012 0848   CO2 28 01/23/2018 0947   CO2 25 05/17/2017 0957   GLUCOSE 92 01/23/2018 0947   GLUCOSE 85 05/17/2017 0957   GLUCOSE 122 (H) 11/19/2012 0848   BUN 21 01/23/2018 0947   BUN 15.2 05/17/2017 0957   CREATININE 0.79 01/23/2018 0947   CREATININE 0.8 05/17/2017 0957   CALCIUM 9.5 01/23/2018 0947   CALCIUM 9.6 05/17/2017 0957   PROT 7.1 01/23/2018 0947   PROT 7.0 05/17/2017 0957   ALBUMIN 3.8 01/23/2018 0947   ALBUMIN 3.9 05/17/2017 0957   AST 26 01/23/2018 0947   AST 25 05/17/2017 0957   ALT 25 01/23/2018 0947   ALT 18 05/17/2017 0957   ALKPHOS 56 01/23/2018 0947   ALKPHOS 57 05/17/2017 0957   BILITOT 0.5 01/23/2018 0947   BILITOT 0.47 05/17/2017 0957   GFRNONAA >60 01/23/2018 0947   GFRAA >60 01/23/2018 0947    No results found for: SPEP,  UPEP  Lab Results  Component Value Date   WBC 2.9 (L) 01/23/2018   NEUTROABS 2.0 01/23/2018   HGB 12.8 01/23/2018   HCT 38.0 01/23/2018   MCV 95.9 01/23/2018   PLT 183 01/23/2018      Chemistry      Component Value Date/Time   NA 142 01/23/2018 0947   NA 141 05/17/2017 0957   K 4.1 01/23/2018 0947   K 3.6 05/17/2017 0957   CL 107 01/23/2018 0947   CL 105 11/19/2012 0848   CO2 28 01/23/2018 0947   CO2 25 05/17/2017 0957   BUN 21 01/23/2018 0947   BUN 15.2 05/17/2017 0957   CREATININE 0.79 01/23/2018 0947   CREATININE 0.8 05/17/2017 0957   GLU 158 (H) 01/14/2015 1035      Component Value Date/Time   CALCIUM 9.5 01/23/2018 0947   CALCIUM 9.6 05/17/2017 0957   ALKPHOS 56 01/23/2018 0947   ALKPHOS 57 05/17/2017 0957   AST 26 01/23/2018 0947   AST 25 05/17/2017 0957   ALT 25 01/23/2018 0947   ALT 18 05/17/2017 0957   BILITOT 0.5 01/23/2018 0947   BILITOT 0.47 05/17/2017 0957       RADIOGRAPHIC STUDIES: I have reviewed multiple imaging studies with the patient and her husband I have personally reviewed the radiological images as listed and agreed with the findings in the report. Nm Pet Image Restag (ps) Skull Base To Thigh  Result Date: 01/23/2018 CLINICAL DATA:  Subsequent treatment strategy for endometrial cancer (high-grade papillary serous endometrial carcinoma diagnosed in 2014). EXAM: NUCLEAR MEDICINE PET SKULL BASE TO THIGH TECHNIQUE: 7.7 mCi F-18 FDG was injected intravenously. Full-ring PET imaging was performed from the skull base to thigh after the radiotracer. CT data was obtained and used for attenuation correction and anatomic localization. Fasting blood glucose: 69 mg/dl COMPARISON:  PET-CT 10/15/2017 and 07/11/2017. Images from thyroid and right cervical nodal biopsy 07/18/2017. FINDINGS: Mediastinal blood pool activity: SUV max 1.9 NECK: There is persistent hypermetabolic lower right paratracheal activity, corresponding with a right thyroid nodule and/or  adjacent cervical adenopathy. This currently has an SUV max of 11.2 (previously 10.2). Corresponding soft tissue nodule measuring 2.9 x 1.8 cm on image 46/4 is unchanged. Both the thyroid nodule and an adjacent cervical lymph node have been previously biopsied. Metastatic disease was present in the cervical lymph node. No other hypermetabolic cervical lymph nodes are demonstrated.There are no lesions of the pharyngeal mucosal space. Activity within the lymphoid tissue of Waldeyer's ring is similar to the previous study and within  physiologic limits. Incidental CT findings: none CHEST: 9 mm hypermetabolic right paratracheal node is not significantly changed in size, although has slightly increased metabolic activity compared with the prior study. Currently, this has an SUV max of 7.9. Previously 5.2. No other hypermetabolic mediastinal, hilar or axillary lymph nodes. No suspicious pulmonary activity. Incidental CT findings: There is stable linear scarring medially in the right upper lobe, likely from previous radiation therapy. Right IJ Port-A-Cath extends to the level of the upper right atrium. Mild aortic atherosclerosis and probable aortic valvular calcifications. ABDOMEN/PELVIS: There is no hypermetabolic activity within the liver, adrenal glands, spleen or pancreas. There is no hypermetabolic nodal activity. There is no adnexal mass or abnormal pelvic activity post hysterectomy. Incidental CT findings: Mild aortic atherosclerosis. SKELETON: There is no hypermetabolic activity to suggest osseous metastatic disease. Incidental CT findings: none IMPRESSION: 1. Overall, no significant change from PET-CT of 3 months ago. There is persistent hypermetabolic activity at the right thoracic inlet and within a right paratracheal mediastinal node. The nodes have not significantly changed in size, although the metabolic activity has minimally increased over this interval. It is uncertain how much of the thoracic inlet  metabolic activity is attributed to the thyroid nodule versus adjacent cervical lymph nodes, both previously biopsied. 2. No new hypermetabolic activity within the neck, chest, abdomen or pelvis. No evidence of local recurrence in the pelvis. 3. Stable probable radiation changes in the right lung. Electronically Signed   By: Richardean Sale M.D.   On: 01/23/2018 11:15    All questions were answered. The patient knows to call the clinic with any problems, questions or concerns. No barriers to learning was detected.  I spent 25 minutes counseling the patient face to face. The total time spent in the appointment was 30 minutes and more than 50% was on counseling and review of test results  [name], MD 01/24/2018 1:59 PM

## 2018-01-24 NOTE — Assessment & Plan Note (Signed)
She is seeing dermatologist for this I recommend avoidance of excessive sun exposure

## 2018-02-05 MED FILL — VENLAFAXINE HCL ER 150 MG C: 150 | 30 days supply | Qty: 30 | Fill #0

## 2018-02-14 ENCOUNTER — Inpatient Hospital Stay: Payer: 59

## 2018-02-14 ENCOUNTER — Inpatient Hospital Stay (HOSPITAL_BASED_OUTPATIENT_CLINIC_OR_DEPARTMENT_OTHER): Payer: 59 | Admitting: Hematology and Oncology

## 2018-02-14 ENCOUNTER — Encounter: Payer: Self-pay | Admitting: Hematology and Oncology

## 2018-02-14 ENCOUNTER — Inpatient Hospital Stay: Payer: 59 | Attending: Hematology and Oncology

## 2018-02-14 DIAGNOSIS — C770 Secondary and unspecified malignant neoplasm of lymph nodes of head, face and neck: Secondary | ICD-10-CM

## 2018-02-14 DIAGNOSIS — C541 Malignant neoplasm of endometrium: Secondary | ICD-10-CM | POA: Diagnosis not present

## 2018-02-14 DIAGNOSIS — Z5112 Encounter for antineoplastic immunotherapy: Secondary | ICD-10-CM | POA: Insufficient documentation

## 2018-02-14 DIAGNOSIS — Z923 Personal history of irradiation: Secondary | ICD-10-CM | POA: Insufficient documentation

## 2018-02-14 DIAGNOSIS — I700 Atherosclerosis of aorta: Secondary | ICD-10-CM | POA: Insufficient documentation

## 2018-02-14 DIAGNOSIS — Z9221 Personal history of antineoplastic chemotherapy: Secondary | ICD-10-CM | POA: Diagnosis not present

## 2018-02-14 DIAGNOSIS — I10 Essential (primary) hypertension: Secondary | ICD-10-CM | POA: Diagnosis not present

## 2018-02-14 DIAGNOSIS — F418 Other specified anxiety disorders: Secondary | ICD-10-CM

## 2018-02-14 DIAGNOSIS — Z7189 Other specified counseling: Secondary | ICD-10-CM

## 2018-02-14 DIAGNOSIS — Z79899 Other long term (current) drug therapy: Secondary | ICD-10-CM

## 2018-02-14 DIAGNOSIS — F411 Generalized anxiety disorder: Secondary | ICD-10-CM

## 2018-02-14 DIAGNOSIS — Z95828 Presence of other vascular implants and grafts: Secondary | ICD-10-CM

## 2018-02-14 LAB — COMPREHENSIVE METABOLIC PANEL
ALT: 20 U/L (ref 0–44)
AST: 24 U/L (ref 15–41)
Albumin: 3.7 g/dL (ref 3.5–5.0)
Alkaline Phosphatase: 54 U/L (ref 38–126)
Anion gap: 8 (ref 5–15)
BUN: 21 mg/dL (ref 8–23)
CO2: 26 mmol/L (ref 22–32)
Calcium: 9.8 mg/dL (ref 8.9–10.3)
Chloride: 103 mmol/L (ref 98–111)
Creatinine, Ser: 0.76 mg/dL (ref 0.44–1.00)
GFR calc Af Amer: >60 mL/min (ref >60)
GFR calc non Af Amer: >60 mL/min (ref >60)
Glucose, Bld: 81 mg/dL (ref 70–99)
Potassium: 4.2 mmol/L (ref 3.5–5.1)
Sodium: 137 mmol/L (ref 135–145)
Total Bilirubin: 0.5 mg/dL (ref 0.3–1.2)
Total Protein: 6.9 g/dL (ref 6.5–8.1)

## 2018-02-14 LAB — CBC WITH DIFFERENTIAL (CANCER CENTER ONLY)
Basophils Absolute: 0 10*3/uL (ref 0.0–0.1)
Basophils Relative: 0 %
Eosinophils Absolute: 0.2 10*3/uL (ref 0.0–0.5)
Eosinophils Relative: 5 %
HCT: 36.6 % (ref 34.8–46.6)
Hemoglobin: 12.2 g/dL (ref 11.6–15.9)
Lymphocytes Relative: 17 %
Lymphs Abs: 0.8 10*3/uL — ABNORMAL LOW (ref 0.9–3.3)
MCH: 32 pg (ref 25.1–34.0)
MCHC: 33.3 g/dL (ref 31.5–36.0)
MCV: 96.1 fL (ref 79.5–101.0)
Monocytes Absolute: 0.3 10*3/uL (ref 0.1–0.9)
Monocytes Relative: 6 %
Neutro Abs: 3.2 10*3/uL (ref 1.5–6.5)
Neutrophils Relative %: 72 %
Platelet Count: 161 10*3/uL (ref 145–400)
RBC: 3.81 MIL/uL (ref 3.70–5.45)
RDW: 12.6 % (ref 11.2–14.5)
WBC Count: 4.5 10*3/uL (ref 3.9–10.3)

## 2018-02-14 LAB — TOTAL PROTEIN, URINE DIPSTICK: Protein, ur: 30 mg/dL — AB

## 2018-02-14 MED ORDER — HEPARIN SOD (PORK) LOCK FLUSH 100 UNIT/ML IV SOLN
500.0000 [IU] | Freq: Once | INTRAVENOUS | Status: AC | PRN
  Administered 2018-02-14: 500 [IU]
  Filled 2018-02-14: qty 5

## 2018-02-14 MED ORDER — SODIUM CHLORIDE 0.9% FLUSH
10.0000 mL | INTRAVENOUS | Status: DC | PRN
  Administered 2018-02-14: 10 mL
  Filled 2018-02-14: qty 10

## 2018-02-14 MED ORDER — SODIUM CHLORIDE 0.9 % IV SOLN
14.8000 mg/kg | Freq: Once | INTRAVENOUS | Status: AC
  Administered 2018-02-14: 1100 mg via INTRAVENOUS
  Filled 2018-02-14: qty 32

## 2018-02-14 MED ORDER — SODIUM CHLORIDE 0.9 % IV SOLN
Freq: Once | INTRAVENOUS | Status: AC
  Administered 2018-02-14: 14:00:00 via INTRAVENOUS
  Filled 2018-02-14: qty 250

## 2018-02-14 MED ORDER — SODIUM CHLORIDE 0.9% FLUSH
10.0000 mL | Freq: Once | INTRAVENOUS | Status: AC
  Administered 2018-02-14: 10 mL
  Filled 2018-02-14: qty 10

## 2018-02-14 NOTE — Progress Notes (Signed)
[facility] OFFICE PROGRESS NOTE  Patient Care Team: [name], MD as PCP - General (Family Medicine) Reynold Bowen, MD as Consulting Physician (Endocrinology)  ASSESSMENT & PLAN:  Endometrial cancer ([place]) Overall, she has stable disease We will continue treatment indefinitely I plan to repeat imaging study again in 3 months  Essential hypertension Her blood pressure is better controlled She will continue close blood pressure monitoring at home and to continue on current prescription antihypertensives  Anxiety about health She has verbalize indication she would like to see a psychiatrist/psychologist to address her symptoms of anxiety She has pending appointment in about a month I spent a lot of time with the patient and encouraged her to seek a hobby with positive thinking and to exercise as tolerated.  She appears motivated.   No orders of the defined types were placed in this encounter.   INTERVAL HISTORY: Please see below for problem oriented charting. She returns for further follow-up She feels well She continues to have mild generalized anxiety about her care She has appointment to see psychologist soon She tolerated treatment very well without any side effects Her blood pressure monitoring at home is stable/improved. The patient denies any recent signs or symptoms of bleeding such as spontaneous epistaxis, hematuria or hematochezia.  SUMMARY OF ONCOLOGIC HISTORY: Oncology History   [employer] testing done 11-2015 on surgical path from 2014: MS stable TMB low 4 muts/mb ATM [id number] ERBB3 T389K E2H2 rearrangement exon 9 PPP2R1A P179R TP53 [id number]    ER- APPROXIMATELY 25-35% STAINING IN NEOPLASTIC CELLS (INTERMEDIATE)  PR- APPROXIMATELY 25-35% STAINING IN NEOPLASTIC CELLS (STRONG)  Repeat biopsy 04/02/17: ER negative, Her 2 negative     Endometrial cancer ([place])   06/20/2012 Pathology Results    Biopsy positive for papillary  serous carcinoma    06/20/2012 Genetic Testing    [employer] testing done 11-2015 on surgical path from 2014: MS stable TMB low 4 muts/mb ATM [id number] ERBB3 T389K E2H2 rearrangement exon 9 PPP2R1A P179R TP53 I195N     06/20/2012 Initial Diagnosis    Patient presented to PCP with intermittent vaginal bleeding since ~ Oct 2013, endometrial biopsy 06-20-12 with complex endometrial hyperplasia with atypia    06/26/2012 Imaging    Thickened endometrial lining in a postmenopausal patient experiencing vaginal bleeding. In the setting of post-menopausal bleeding, endometrial sampling is indicated to exclude carcinoma. No focal myometrial abnormalities are seen.  Normal left ovary and non-visualized right ovary    07/30/2012 Surgery    Dr. Polly Cobia performed robotic hysterectomy with bilateral salpingo-oophorectomy, bilateral pelvic lymph node dissection and periaortic lymph node dissection. Intraoperatively on frozen section, the patient was noted to have a large endometrial polyp with changes within the polyp consistent for high-grade malignancy, possibly papillary serous carcinoma. There is no obvious extrauterine disease noted.      07/30/2012 Pathology Results    [phone] SUPPLEMENTAL REPORT  THE ENDOMETRIAL CARCINOMA WAS ANALYZED FOR DNA MISMATCH REPAIR PROTEINS.IMMUNOHISTOCHEMICALLY, THE NEOPLASM RETAINED NUCLEAR EXPRESSION OF 4 GENE PRODUCTS, MLH1, MSH2, MSH6, AND PMS2, INVOLVED IN DNA MISMATCH REPAIR.POSITIVE AND NEGATIVE CONTROLS WORKED APPROPRIATELY.  PER REQUEST, AN ER AND PR ARE PERFORMED ON BLOCK 1I.  ER- APPROXIMATELY 25-35% STAINING IN NEOPLASTIC CELLS (INTERMEDIATE)  PR- APPROXIMATELY 25-35% STAINING IN NEOPLASTIC CELLS (STRONG)  ER AND PR PREDOMINANTLY SHOW STAINING IN THE SEROUS COMPONENT.  DIAGNOSIS  1. UTERUS, CERVIX WITH BILATERAL FALLOPIAN TUBES AND OVARIES,  HYSTERECTOMY AND BILATERAL SALPINGO-OOPHORECTOMY:  HIGH GRADE/POORLY DIFFERENTIATED  ENDOMETRIAL ADENOCARCINOMA WITH SOLID AND SEROUS PAPILLARY  COMPONENTS.  HISTOPATHOLOGIC TYPE:A VARIETY OF PATTERNS WERE PRESENT, ALL OF WHICH SHOULD BE CONSIDERED TO BE HIGH GRADE.SEROUS PAPILLARY CARCINOMA WAS SEEN OCCURRING ADJACENT TO SOLID ENDOMETRIAL CARCINOMA WITH MARKED ANAPLASIA AND GIANT CELLS. SIZE:TUMOR MEASURED AT LEAST 4.8 CM IN GREATEST HORIZONTAL DIMENSION.  GRADE: POORLY DIFFERENTIATED OR HIGH GRADE. DEPTH OF INVASION: NO DEFINITE MYOMETRIAL INVOLVEMENT WAS SEEN.THE TUMOR APPEARED CONFINED TO THE POLYP AS WELL AS SURFACE ENDOMETRIUM. WHERE MYOMETRIAL THICKNESS NOT APPLICABLE. SEROSAL INVOLVEMENT: NOT DEMONSTRATED.  ENDOCERVICAL INVOLVEMENT:NOT DEMONSTRATED. RESECTION MARGINS: FREE OF INVOLVEMENT. EXTRAUTERINE EXTENSION:NOT DEMONSTRATED. ANGIOLYMPHATIC INVASION: NOT DEFINITELY SEEN. TOTAL NODES EXAMINED:22.  PELVIC NODES EXAMINED:20.  PELVIC NODES INVOLVED:0.  PARA-AORTIC NODES 2. EXAMINED:  PARA-AORTIC NODES 0. INVOLVED TNM STAGE: T1A N0 MX. AJCC STAGE GROUPING: IA. FIGO STAGE:IA.    08/26/2012 Imaging    No CT evidence for intra-abdominal or pelvic metastatic disease. Trace free pelvic fluid, presumably postoperative although the date of surgery is not documented in the electronic medical record.    08/26/2012 - 12/13/2012 Chemotherapy    She received 6 cycles of carbo/taxol     10/29/2012 - 11/27/2012 Radiation Therapy    May 27, June 5,  June 11, June 19, November 27, 2012: Proximal vagina 30 Gy in 5 fractions      01/17/2013 Imaging    1.  No evidence of recurrent or metastatic disease. 2.  No acute abnormality involving the abdomen or pelvis. 3.  Mild diffuse hepatic steatosis. 4.  Very small supraumbilical midline anterior abdominal wall hernia containing fat, unchanged    01/22/2014 Imaging    No evidence for metastatic or recurrent disease. 2. No bowel obstruction.   Normal appendix. 3. Small fat containing hernia, stable in appearance. 4. Status post hysterectomy and bilateral oophorectomy    02/19/2014 Imaging    No pulmonary lesions are identified. The abnormality on the chest x-ray is due to asymmetric left-sided sternoclavicular joint degenerative disease    10/12/2014 Imaging    New mild retroperitoneal lymphadenopathy in the left paraaortic region and proximal left common iliac chain, consistent with metastatic disease. No other sites of metastatic disease identified within the abdomen or pelvis.      11/27/2014 - 02/11/2015 Chemotherapy    She received 4 cycles of carbo/taxol     03/01/2015 PET scan    Single hypermetabolic small retroperitoneal lymph node along the aorta. 2. No evidence of metastatic disease otherwise in the abdomen or pelvis. No evidence local recurrence. 3. Intensely hypermetabolic enlarged nodule adjacent to the RIGHT lobe of thyroid gland. This presumably represents the biopsied lesion in clinician report which was found to be benign thyroid tissue.    04/05/2015 - 05/13/2015 Radiation Therapy    She received 50.4 gray in 28 fractions with simultaneous integrated boost to 56 gray    06/17/2015 Imaging    No acute process or evidence of metastatic disease in the abdomen or pelvis. Resolution of previously described retroperitoneal adenopathy. 2.  Possible constipation. 3. Atherosclerosis.    06/17/2015 Tumor Marker    Patient's tumor was tested for the following markers: CA125 Results of the tumor marker test revealed 33    08/12/2015 Tumor Marker    Patient's tumor was tested for the following markers: CA125 Results of the tumor marker test revealed 52    08/31/2015 PET scan    Development of right paratracheal hypermetabolic adenopathy, consistent with nodal metastasis. 2. The previously described isolated abdominal retroperitoneal hypermetabolic node has resolved. 3. Persistent hypermetabolic right thyroid nodule, per report  previously biopsied.  Correlate with those results. 4.  Possible constipation.    09/09/2015 -  Anti-estrogen oral therapy    She has been receiving alternative treatment between megace and tamoxifen    09/09/2015 Tumor Marker    Patient's tumor was tested for the following markers: CA125 Results of the tumor marker test revealed 37.8    09/20/2015 Procedure    Technically successful ultrasound-guided thyroid aspiration biopsy , dominant right nodule    09/20/2015 Pathology Results    THYROID, RIGHT, FINE NEEDLE ASPIRATION (SPECIMEN 1 OF 1 COLLECTED 09/20/15): FINDINGS CONSISTENT WITH BENIGN THYROID NODULE (BETHESDA CATEGORY II).    10/28/2015 Tumor Marker    Patient's tumor was tested for the following markers: CA125 Results of the tumor marker test revealed 53.2    11/04/2015 Imaging    Stable benign right thyroid nodule    12/16/2015 Tumor Marker    Patient's tumor was tested for the following markers: CA125 Results of the tumor marker test revealed 38.1    03/22/2016 PET scan    Interval progression of hypermetabolic right paratracheal lymph node consistent with metastatic involvement. 2. Stable hypermetabolic right thyroid nodule. Reportedly this has been biopsied in the past.    03/23/2016 Tumor Marker    Patient's tumor was tested for the following markers: CA125 Results of the tumor marker test revealed 62.4    04/13/2016 - 06/01/2016 Radiation Therapy    She received 56 Gy to the chest in 28 fractions     05/09/2016 Imaging    No evidence of left lower extremity deep vein thrombosis. No evidence of a superficial thrombosis of the greater and lesser saphenous veins. Positive for thrombus noted in several varicosities of the calf. No evidence of Baker's cyst on the left.    05/17/2016 Tumor Marker    Patient's tumor was tested for the following markers: CA125 Results of the tumor marker test revealed 9    06/29/2016 Tumor Marker    Patient's tumor was tested for the following  markers: CA125 Results of the tumor marker test revealed 5.9    08/04/2016 Tumor Marker    Patient's tumor was tested for the following markers: CA125 Results of the tumor marker test revealed 6.0    09/12/2016 PET scan    Complete metabolic response to therapy, with resolution of hypermetabolic mediastinal lymphadenopathy since prior exam. No residual or new metastatic disease identified. Stable hypermetabolic right thyroid lobe nodule, which was previously biopsied on 09/20/2015.    03/13/2017 PET scan    1. Solitary focus of recurrent right paratracheal hypermetabolic activity, with a 1.0 cm right lower paratracheal node having a maximum SUV of 9.0. Appearance compatible with recurrent malignancy. 2. Continued hypermetabolic right thyroid nodule, previously biopsied, presumed benign -correlate with prior biopsy results. 3. Other imaging findings of potential clinical significance: Aortic Atherosclerosis (ICD10-I70.0). Mild cardiomegaly. Prominent stool throughout the colon favors constipation.    04/02/2017 Pathology Results    FINE NEEDLE ASPIRATION, ENDOSCOPIC, (EBUS) 4 R NODE (SPECIMEN 1 OF 2 COLLECTED 04/02/17): MALIGNANT CELLS PRESENT, CONSISTENT WITH CARCINOMA. SEE COMMENT. COMMENT: THE MALIGNANT CELLS ARE POSITIVE FOR P53 AND NEGATIVE FOR ESTROGEN RECEPTOR AND TTF-1. THIS PROFILE IS NON-SPECIFIC, BUT THE P53 POSITIVE STAINING IS SUGGESTIVE OF GYNECOLOGIC PRIMARY.     04/11/2017 Procedure    Successful 8 French right internal jugular vein power port placement with its tip at the SVC/RA junction    04/12/2017 Imaging    Normal LV size with mild LV hypertrophy. EF 55-60%. Normal RV size and systolic  function. Aortic valve sclerosis without significant stenosis.    04/18/2017 - 06/14/2017 Chemotherapy    The patient had chemotherapy with Doxil     07/10/2017 Imaging    - Left ventricle: The cavity size was normal. There was mild concentric hypertrophy. Systolic function was normal. The  estimated ejection fraction was in the range of 55% to 60%. Wall motion was normal; there were no regional wall motion abnormalities. Doppler parameters are consistent with abnormal left ventricular relaxation (grade 1 diastolic dysfunction). - Aortic valve: Noncoronary cusp mobility was mildly restricted. - Mitral valve: There was mild regurgitation. - Left atrium: The atrium was mildly dilated. - Atrial septum: No defect or patent foramen ovale was identified.  Impressions:  - Compared to November 2018, global LV longitudinal strain remains normal (has increased)    07/11/2017 PET scan    Increased size and hypermetabolic activity of 3.3 cm right thyroid lobe nodule. Thyroid carcinoma cannot be excluded. Recommend repeat ultrasound guided fine needle aspiration to exclude thyroid carcinoma.  New adjacent hypermetabolic 10 mm right supraclavicular lymph node, suspicious for lymph node metastasis.  Slight increase in size and hypermetabolic activity of solitary right paratracheal lymph node.  No evidence of abdominal or pelvic metastatic disease.    07/18/2017 Procedure    1. Technically successful ultrasound guided fine needle aspiration of indeterminate hypermetabolic right-sided thyroid nodule/mass. 2. Technically successful ultrasound-guided core needle biopsy of hypermetabolic right lower cervical lymph node.    07/18/2017 Pathology Results    THYROID, FINE NEEDLE ASPIRATION RIGHT (SPECIMEN 1 OF 1, COLLECTED ON 07/18/2017): ATYPIA OF UNDETERMINED SIGNIFICANCE OR FOLLICULAR LESION OF UNDETERMINED SIGNIFICANCE (BETHESDA CATEGORY III). SEE COMMENT. COMMENT: THE SPECIMEN CONSISTS OF SMALL AND MEDIUM SIZED GROUPS OF FOLLICULAR EPITHELIAL CELLS WITH MILD CYTOLOGIC ATYPIA INCLUDING NUCLEAR ENLARGEMENT AND HURTHLE CELL CHANGE. SOME GROUPS ARE ARRANGED AS MICROFOLLICLES. THERE IS MINIMAL BACKGROUND COLLOID. BASED ON THESE FEATURES, A FOLLICULAR LESION/NEOPLASM CAN NOT BE ENTIRELY RULED OUT. A  SPECIMEN WILL BE SENT FOR AFIRMA TESTING.    07/19/2017 Pathology Results    Lymph node, needle/core biopsy - METASTATIC PAPILLARY SEROUS CARCINOMA. - SEE COMMENT. Microscopic Comment Dr. Vicente Males has reviewed the case and concurs with this interpretation    10/15/2017 PET scan    No new or progressive disease. No evidence of abdominal or pelvic metastatic disease.  Stable hypermetabolic right thyroid lobe nodule and adjacent right supraclavicular lymph node.  Decreased size and hypermetabolic activity of solitary right paratracheal lymph node.    01/23/2018 PET scan    1. Overall, no significant change from PET-CT of 3 months ago. There is persistent hypermetabolic activity at the right thoracic inlet and within a right paratracheal mediastinal node. The nodes have not significantly changed in size, although the metabolic activity has minimally increased over this interval. It is uncertain how much of the thoracic inlet metabolic activity is attributed to the thyroid nodule versus adjacent cervical lymph nodes, both previously biopsied. 2. No new hypermetabolic activity within the neck, chest, abdomen or pelvis. No evidence of local recurrence in the pelvis. 3. Stable probable radiation changes in the right lung.     REVIEW OF SYSTEMS:   Constitutional: Denies fevers, chills or abnormal weight loss Eyes: Denies blurriness of vision Ears, nose, mouth, throat, and face: Denies mucositis or sore throat Respiratory: Denies cough, dyspnea or wheezes Cardiovascular: Denies palpitation, chest discomfort or lower extremity swelling Gastrointestinal:  Denies nausea, heartburn or change in bowel habits Skin: Denies abnormal skin rashes Lymphatics: Denies new lymphadenopathy  or easy bruising Neurological:Denies numbness, tingling or new weaknesses  All other systems were reviewed with the patient and are negative.  I have reviewed the past medical history, past surgical history, social  history and family history with the patient and they are unchanged from previous note.  ALLERGIES:  has No Known Allergies.  MEDICATIONS:  Current Outpatient Medications  Medication Sig Dispense Refill  . amLODipine (NORVASC) 10 MG tablet TAKE 1 TABLET BY MOUTH DAILY. DECREASE SIMVASTATIN TO 20MG DAILY WHILE ON AMLODIPINE PER MD 90 tablet 0  . Calcium Carb-Cholecalciferol 500-600 MG-UNIT TABS Take 1 tablet by mouth daily.     . Cholecalciferol (VITAMIN D3) 2000 units TABS Take 2,000 Units by mouth daily.     . hydrochlorothiazide (HYDRODIURIL) 25 MG tablet Take 1 tablet (25 mg total) by mouth daily. 30 tablet 9  . LORazepam (ATIVAN) 0.5 MG tablet Take 0.5 mg by mouth 2 (two) times daily as needed.  0  . metoprolol tartrate (LOPRESSOR) 50 MG tablet TAKE 1 TABLET (50 MG TOTAL) BY MOUTH 2 (TWO) TIMES DAILY. 60 tablet 1  . simvastatin (ZOCOR) 40 MG tablet Take 40 mg by mouth every evening.    . tacrolimus (PROTOPIC) 0.1 % ointment Apply 1 application topically daily as needed (rash).     . venlafaxine XR (EFFEXOR-XR) 75 MG 24 hr capsule Take 75 mg by mouth daily with breakfast.      No current facility-administered medications for this visit.    Facility-Administered Medications Ordered in Other Visits  Medication Dose Route Frequency Provider Last Rate Last Dose  . sodium chloride flush (NS) 0.9 % injection 10 mL  10 mL Intracatheter PRN Alvy Bimler, Kayle Passarelli, MD   10 mL at 02/14/18 1549    PHYSICAL EXAMINATION: ECOG PERFORMANCE STATUS: 1 - Symptomatic but completely ambulatory  Vitals:   02/14/18 1303  BP: 129/74  Pulse: 69  Resp: 18  Temp: 98 F (36.7 C)  SpO2: 96%   Filed Weights   02/14/18 1303  Weight: 160 lb (72.6 kg)    GENERAL:alert, no distress and comfortable SKIN: skin color, texture, turgor are normal, no rashes or significant lesions EYES: normal, Conjunctiva are pink and non-injected, sclera clear OROPHARYNX:no exudate, no erythema and lips, buccal mucosa, and tongue  normal  NECK: supple, thyroid normal size, non-tender, without nodularity LYMPH:  no palpable lymphadenopathy in the cervical, axillary or inguinal LUNGS: clear to auscultation and percussion with normal breathing effort HEART: regular rate & rhythm and no murmurs and no lower extremity edema ABDOMEN:abdomen soft, non-tender and normal bowel sounds Musculoskeletal:no cyanosis of digits and no clubbing  NEURO: alert & oriented x 3 with fluent speech, no focal motor/sensory deficits  LABORATORY DATA:  I have reviewed the data as listed    Component Value Date/Time   NA 137 02/14/2018 1202   NA 141 05/17/2017 0957   K 4.2 02/14/2018 1202   K 3.6 05/17/2017 0957   CL 103 02/14/2018 1202   CL 105 11/19/2012 0848   CO2 26 02/14/2018 1202   CO2 25 05/17/2017 0957   GLUCOSE 81 02/14/2018 1202   GLUCOSE 85 05/17/2017 0957   GLUCOSE 122 (H) 11/19/2012 0848   BUN 21 02/14/2018 1202   BUN 15.2 05/17/2017 0957   CREATININE 0.76 02/14/2018 1202   CREATININE 0.8 05/17/2017 0957   CALCIUM 9.8 02/14/2018 1202   CALCIUM 9.6 05/17/2017 0957   PROT 6.9 02/14/2018 1202   PROT 7.0 05/17/2017 0957   ALBUMIN 3.7 02/14/2018 1202  ALBUMIN 3.9 05/17/2017 0957   AST 24 02/14/2018 1202   AST 25 05/17/2017 0957   ALT 20 02/14/2018 1202   ALT 18 05/17/2017 0957   ALKPHOS 54 02/14/2018 1202   ALKPHOS 57 05/17/2017 0957   BILITOT 0.5 02/14/2018 1202   BILITOT 0.47 05/17/2017 0957   GFRNONAA >60 02/14/2018 1202   GFRAA >60 02/14/2018 1202    No results found for: SPEP, UPEP  Lab Results  Component Value Date   WBC 4.5 02/14/2018   NEUTROABS 3.2 02/14/2018   HGB 12.2 02/14/2018   HCT 36.6 02/14/2018   MCV 96.1 02/14/2018   PLT 161 02/14/2018      Chemistry      Component Value Date/Time   NA 137 02/14/2018 1202   NA 141 05/17/2017 0957   K 4.2 02/14/2018 1202   K 3.6 05/17/2017 0957   CL 103 02/14/2018 1202   CL 105 11/19/2012 0848   CO2 26 02/14/2018 1202   CO2 25 05/17/2017 0957    BUN 21 02/14/2018 1202   BUN 15.2 05/17/2017 0957   CREATININE 0.76 02/14/2018 1202   CREATININE 0.8 05/17/2017 0957   GLU 158 (H) 01/14/2015 1035      Component Value Date/Time   CALCIUM 9.8 02/14/2018 1202   CALCIUM 9.6 05/17/2017 0957   ALKPHOS 54 02/14/2018 1202   ALKPHOS 57 05/17/2017 0957   AST 24 02/14/2018 1202   AST 25 05/17/2017 0957   ALT 20 02/14/2018 1202   ALT 18 05/17/2017 0957   BILITOT 0.5 02/14/2018 1202   BILITOT 0.47 05/17/2017 0957       RADIOGRAPHIC STUDIES: I have personally reviewed the radiological images as listed and agreed with the findings in the report. Nm Pet Image Restag (ps) Skull Base To Thigh  Result Date: 01/23/2018 CLINICAL DATA:  Subsequent treatment strategy for endometrial cancer (high-grade papillary serous endometrial carcinoma diagnosed in 2014). EXAM: NUCLEAR MEDICINE PET SKULL BASE TO THIGH TECHNIQUE: 7.7 mCi F-18 FDG was injected intravenously. Full-ring PET imaging was performed from the skull base to thigh after the radiotracer. CT data was obtained and used for attenuation correction and anatomic localization. Fasting blood glucose: 69 mg/dl COMPARISON:  PET-CT 10/15/2017 and 07/11/2017. Images from thyroid and right cervical nodal biopsy 07/18/2017. FINDINGS: Mediastinal blood pool activity: SUV max 1.9 NECK: There is persistent hypermetabolic lower right paratracheal activity, corresponding with a right thyroid nodule and/or adjacent cervical adenopathy. This currently has an SUV max of 11.2 (previously 10.2). Corresponding soft tissue nodule measuring 2.9 x 1.8 cm on image 46/4 is unchanged. Both the thyroid nodule and an adjacent cervical lymph node have been previously biopsied. Metastatic disease was present in the cervical lymph node. No other hypermetabolic cervical lymph nodes are demonstrated.There are no lesions of the pharyngeal mucosal space. Activity within the lymphoid tissue of Waldeyer's ring is similar to the previous  study and within physiologic limits. Incidental CT findings: none CHEST: 9 mm hypermetabolic right paratracheal node is not significantly changed in size, although has slightly increased metabolic activity compared with the prior study. Currently, this has an SUV max of 7.9. Previously 5.2. No other hypermetabolic mediastinal, hilar or axillary lymph nodes. No suspicious pulmonary activity. Incidental CT findings: There is stable linear scarring medially in the right upper lobe, likely from previous radiation therapy. Right IJ Port-A-Cath extends to the level of the upper right atrium. Mild aortic atherosclerosis and probable aortic valvular calcifications. ABDOMEN/PELVIS: There is no hypermetabolic activity within the liver, adrenal glands, spleen  or pancreas. There is no hypermetabolic nodal activity. There is no adnexal mass or abnormal pelvic activity post hysterectomy. Incidental CT findings: Mild aortic atherosclerosis. SKELETON: There is no hypermetabolic activity to suggest osseous metastatic disease. Incidental CT findings: none IMPRESSION: 1. Overall, no significant change from PET-CT of 3 months ago. There is persistent hypermetabolic activity at the right thoracic inlet and within a right paratracheal mediastinal node. The nodes have not significantly changed in size, although the metabolic activity has minimally increased over this interval. It is uncertain how much of the thoracic inlet metabolic activity is attributed to the thyroid nodule versus adjacent cervical lymph nodes, both previously biopsied. 2. No new hypermetabolic activity within the neck, chest, abdomen or pelvis. No evidence of local recurrence in the pelvis. 3. Stable probable radiation changes in the right lung. Electronically Signed   By: Richardean Sale M.D.   On: 01/23/2018 11:15    All questions were answered. The patient knows to call the clinic with any problems, questions or concerns. No barriers to learning was  detected.  I spent 15 minutes counseling the patient face to face. The total time spent in the appointment was 20 minutes and more than 50% was on counseling and review of test results  [name], MD 02/14/2018 3:56 PM

## 2018-02-14 NOTE — Patient Instructions (Signed)
[facility] Discharge Instructions for Patients Receiving Chemotherapy  Today you received the following chemotherapy agents: Bevacizumab (Avastin)  To help prevent nausea and vomiting after your treatment, we encourage you to take your nausea medication as directed.    If you develop nausea and vomiting that is not controlled by your nausea medication, call the clinic.   BELOW ARE SYMPTOMS THAT SHOULD BE REPORTED IMMEDIATELY:  *FEVER GREATER THAN 100.5 F  *CHILLS WITH OR WITHOUT FEVER  NAUSEA AND VOMITING THAT IS NOT CONTROLLED WITH YOUR NAUSEA MEDICATION  *UNUSUAL SHORTNESS OF BREATH  *UNUSUAL BRUISING OR BLEEDING  TENDERNESS IN MOUTH AND THROAT WITH OR WITHOUT PRESENCE OF ULCERS  *URINARY PROBLEMS  *BOWEL PROBLEMS  UNUSUAL RASH Items with * indicate a potential emergency and should be followed up as soon as possible.  Feel free to call the clinic should you have any questions or concerns. The clinic phone number is [phone].  Please show the CHEMO ALERT CARD at check-in to the Emergency Department and triage nurse.   

## 2018-02-14 NOTE — Assessment & Plan Note (Signed)
She has verbalize indication she would like to see a psychiatrist/psychologist to address her symptoms of anxiety She has pending appointment in about a month I spent a lot of time with the patient and encouraged her to seek a hobby with positive thinking and to exercise as tolerated.  She appears motivated.

## 2018-02-14 NOTE — Assessment & Plan Note (Signed)
Overall, she has stable disease We will continue treatment indefinitely I plan to repeat imaging study again in 3 months

## 2018-02-14 NOTE — Assessment & Plan Note (Signed)
Her blood pressure is better controlled She will continue close blood pressure monitoring at home and to continue on current prescription antihypertensives

## 2018-02-19 MED FILL — METOPROLOL TARTRATE 50 MG T: 50 | 30 days supply | Qty: 60 | Fill #1

## 2018-02-28 ENCOUNTER — Encounter: Payer: Self-pay | Admitting: Hematology and Oncology

## 2018-02-28 DIAGNOSIS — H5203 Hypermetropia, bilateral: Secondary | ICD-10-CM | POA: Diagnosis not present

## 2018-03-01 DIAGNOSIS — M859 Disorder of bone density and structure, unspecified: Secondary | ICD-10-CM | POA: Diagnosis not present

## 2018-03-01 DIAGNOSIS — Z0000 Encounter for general adult medical examination without abnormal findings: Secondary | ICD-10-CM | POA: Diagnosis not present

## 2018-03-01 DIAGNOSIS — C541 Malignant neoplasm of endometrium: Secondary | ICD-10-CM | POA: Diagnosis not present

## 2018-03-01 DIAGNOSIS — Z6827 Body mass index (BMI) 27.0-27.9, adult: Secondary | ICD-10-CM | POA: Diagnosis not present

## 2018-03-01 DIAGNOSIS — E7800 Pure hypercholesterolemia, unspecified: Secondary | ICD-10-CM | POA: Diagnosis not present

## 2018-03-01 DIAGNOSIS — I10 Essential (primary) hypertension: Secondary | ICD-10-CM | POA: Diagnosis not present

## 2018-03-01 DIAGNOSIS — E663 Overweight: Secondary | ICD-10-CM | POA: Diagnosis not present

## 2018-03-01 DIAGNOSIS — E041 Nontoxic single thyroid nodule: Secondary | ICD-10-CM | POA: Diagnosis not present

## 2018-03-01 DIAGNOSIS — F411 Generalized anxiety disorder: Secondary | ICD-10-CM | POA: Diagnosis not present

## 2018-03-01 MED FILL — busPIRone HCL 5 MG TABS: 5 | 10 days supply | Qty: 30 | Fill #0

## 2018-03-01 MED FILL — VENLAFAXINE HCL ER 150 MG C: 150 | 90 days supply | Qty: 90 | Fill #0

## 2018-03-07 ENCOUNTER — Inpatient Hospital Stay: Payer: 59 | Attending: Hematology and Oncology

## 2018-03-07 ENCOUNTER — Encounter: Payer: Self-pay | Admitting: Hematology and Oncology

## 2018-03-07 ENCOUNTER — Inpatient Hospital Stay (HOSPITAL_BASED_OUTPATIENT_CLINIC_OR_DEPARTMENT_OTHER): Payer: 59 | Admitting: Hematology and Oncology

## 2018-03-07 ENCOUNTER — Inpatient Hospital Stay: Payer: 59

## 2018-03-07 ENCOUNTER — Telehealth: Payer: Self-pay | Admitting: Hematology and Oncology

## 2018-03-07 VITALS — BP 129/82

## 2018-03-07 DIAGNOSIS — Z923 Personal history of irradiation: Secondary | ICD-10-CM | POA: Insufficient documentation

## 2018-03-07 DIAGNOSIS — I10 Essential (primary) hypertension: Secondary | ICD-10-CM | POA: Diagnosis not present

## 2018-03-07 DIAGNOSIS — Z79899 Other long term (current) drug therapy: Secondary | ICD-10-CM | POA: Diagnosis not present

## 2018-03-07 DIAGNOSIS — H269 Unspecified cataract: Secondary | ICD-10-CM | POA: Insufficient documentation

## 2018-03-07 DIAGNOSIS — C770 Secondary and unspecified malignant neoplasm of lymph nodes of head, face and neck: Secondary | ICD-10-CM | POA: Insufficient documentation

## 2018-03-07 DIAGNOSIS — C541 Malignant neoplasm of endometrium: Secondary | ICD-10-CM | POA: Diagnosis not present

## 2018-03-07 DIAGNOSIS — Z5112 Encounter for antineoplastic immunotherapy: Secondary | ICD-10-CM | POA: Insufficient documentation

## 2018-03-07 DIAGNOSIS — Z9221 Personal history of antineoplastic chemotherapy: Secondary | ICD-10-CM | POA: Diagnosis not present

## 2018-03-07 DIAGNOSIS — F418 Other specified anxiety disorders: Secondary | ICD-10-CM

## 2018-03-07 DIAGNOSIS — F419 Anxiety disorder, unspecified: Secondary | ICD-10-CM | POA: Insufficient documentation

## 2018-03-07 DIAGNOSIS — Z7189 Other specified counseling: Secondary | ICD-10-CM

## 2018-03-07 DIAGNOSIS — Z95828 Presence of other vascular implants and grafts: Secondary | ICD-10-CM

## 2018-03-07 LAB — CBC WITH DIFFERENTIAL (CANCER CENTER ONLY)
Basophils Absolute: 0.1 10*3/uL (ref 0.0–0.1)
Basophils Relative: 2 %
Eosinophils Absolute: 0.2 10*3/uL (ref 0.0–0.5)
Eosinophils Relative: 5 %
HCT: 36.2 % (ref 34.8–46.6)
Hemoglobin: 12.4 g/dL (ref 11.6–15.9)
Lymphocytes Relative: 15 %
Lymphs Abs: 0.5 10*3/uL — ABNORMAL LOW (ref 0.9–3.3)
MCH: 32.5 pg (ref 25.1–34.0)
MCHC: 34.4 g/dL (ref 31.5–36.0)
MCV: 94.4 fL (ref 79.5–101.0)
Monocytes Absolute: 0.3 10*3/uL (ref 0.1–0.9)
Monocytes Relative: 8 %
Neutro Abs: 2.3 10*3/uL (ref 1.5–6.5)
Neutrophils Relative %: 70 %
Platelet Count: 186 10*3/uL (ref 145–400)
RBC: 3.83 MIL/uL (ref 3.70–5.45)
RDW: 12.8 % (ref 11.2–14.5)
WBC Count: 3.3 10*3/uL — ABNORMAL LOW (ref 3.9–10.3)

## 2018-03-07 LAB — TOTAL PROTEIN, URINE DIPSTICK: Protein, ur: 30 mg/dL — AB

## 2018-03-07 LAB — COMPREHENSIVE METABOLIC PANEL
ALT: 22 U/L (ref 0–44)
AST: 24 U/L (ref 15–41)
Albumin: 3.6 g/dL (ref 3.5–5.0)
Alkaline Phosphatase: 54 U/L (ref 38–126)
Anion gap: 8 (ref 5–15)
BUN: 16 mg/dL (ref 8–23)
CO2: 29 mmol/L (ref 22–32)
Calcium: 9.9 mg/dL (ref 8.9–10.3)
Chloride: 106 mmol/L (ref 98–111)
Creatinine, Ser: 0.77 mg/dL (ref 0.44–1.00)
GFR calc Af Amer: >60 mL/min (ref >60)
GFR calc non Af Amer: >60 mL/min (ref >60)
Glucose, Bld: 94 mg/dL (ref 70–99)
Potassium: 4 mmol/L (ref 3.5–5.1)
Sodium: 143 mmol/L (ref 135–145)
Total Bilirubin: 0.4 mg/dL (ref 0.3–1.2)
Total Protein: 7 g/dL (ref 6.5–8.1)

## 2018-03-07 MED ORDER — HEPARIN SOD (PORK) LOCK FLUSH 100 UNIT/ML IV SOLN
500.0000 [IU] | Freq: Once | INTRAVENOUS | Status: AC | PRN
  Administered 2018-03-07: 500 [IU]
  Filled 2018-03-07: qty 5

## 2018-03-07 MED ORDER — SODIUM CHLORIDE 0.9% FLUSH
10.0000 mL | Freq: Once | INTRAVENOUS | Status: AC
  Administered 2018-03-07: 10 mL
  Filled 2018-03-07: qty 10

## 2018-03-07 MED ORDER — SODIUM CHLORIDE 0.9% FLUSH
10.0000 mL | INTRAVENOUS | Status: DC | PRN
  Administered 2018-03-07: 10 mL
  Filled 2018-03-07: qty 10

## 2018-03-07 MED ORDER — SODIUM CHLORIDE 0.9 % IV SOLN
14.8000 mg/kg | Freq: Once | INTRAVENOUS | Status: AC
  Administered 2018-03-07: 1100 mg via INTRAVENOUS
  Filled 2018-03-07: qty 32

## 2018-03-07 MED ORDER — SODIUM CHLORIDE 0.9 % IV SOLN
Freq: Once | INTRAVENOUS | Status: AC
  Administered 2018-03-07: 10:00:00 via INTRAVENOUS
  Filled 2018-03-07: qty 250

## 2018-03-07 NOTE — Assessment & Plan Note (Signed)
She is meeting with psychologist tomorrow.  She was started on BuSpar recently

## 2018-03-07 NOTE — Progress Notes (Signed)
[place] OFFICE PROGRESS NOTE  Patient Care Team: [name], MD as PCP - General (Family Medicine) Reynold Bowen, MD as Consulting Physician (Endocrinology)  ASSESSMENT & PLAN:  Endometrial cancer [facility]) Overall, she has stable disease We will continue treatment indefinitely I plan to repeat imaging study again in 3 months, due next in November 2019  Essential hypertension Her blood pressure is better controlled She will continue close blood pressure monitoring at home and to continue on current prescription antihypertensives  Anxiety about health She is meeting with psychologist tomorrow.  She was started on BuSpar recently  Cataracts, bilateral The patient had bilateral cataracts and is contemplating surgery If her next PET CT scan showed no significant signs of disease progression, I recommend holding off Avastin to allow cataract surgery   Orders Placed This Encounter  Procedures  . NM PET Image Restag (PS) Skull Base To Thigh    Standing Status:   Future    Standing Expiration Date:   03/08/2019    Order Specific Question:   If indicated for the ordered procedure, I authorize the administration of a radiopharmaceutical per Radiology protocol    Answer:   Yes    Order Specific Question:   Preferred imaging location?    Answer:   [facility]    Order Specific Question:   Radiology Contrast Protocol - do NOT remove file path    Answer:   [email or web address]    INTERVAL HISTORY: Please see below for problem oriented charting. She returns for further follow-up She tolerated treatment well No new lymphadenopathy Denies recent infection, fever or chills She has appointment to see psychiatrist tomorrow She is contemplating cataract surgery  SUMMARY OF ONCOLOGIC HISTORY: [place] [facility] testing done 11-2015 on surgical path from 2014: MS stable TMB low 4 muts/mb ATM [id number] ERBB3  T389K E2H2 rearrangement exon 9 PPP2R1A P179R TP53 [id number]    ER- APPROXIMATELY 25-35% STAINING IN NEOPLASTIC CELLS (INTERMEDIATE)  PR- APPROXIMATELY 25-35% STAINING IN NEOPLASTIC CELLS (STRONG)  Repeat biopsy 04/02/17: ER negative, Her 2 negative     Endometrial cancer ([place])   06/20/2012 Pathology Results    Biopsy positive for papillary serous carcinoma    06/20/2012 Genetic Testing    [facility] testing done 11-2015 on surgical path from 2014: MS stable TMB low 4 muts/mb ATM [id number] ERBB3 T389K E2H2 rearrangement exon 9 PPP2R1A P179R TP53 I195N     06/20/2012 Initial Diagnosis    Patient presented to PCP with intermittent vaginal bleeding since ~ Oct 2013, endometrial biopsy 06-20-12 with complex endometrial hyperplasia with atypia    06/26/2012 Imaging    Thickened endometrial lining in a postmenopausal patient experiencing vaginal bleeding. In the setting of post-menopausal bleeding, endometrial sampling is indicated to exclude carcinoma. No focal myometrial abnormalities are seen.  Normal left ovary and non-visualized right ovary    07/30/2012 Surgery    Dr. Polly Cobia performed robotic hysterectomy with bilateral salpingo-oophorectomy, bilateral pelvic lymph node dissection and periaortic lymph node dissection. Intraoperatively on frozen section, the patient was noted to have a large endometrial polyp with changes within the polyp consistent for high-grade malignancy, possibly papillary serous carcinoma. There is no obvious extrauterine disease noted.      07/30/2012 Pathology Results    [phone] SUPPLEMENTAL REPORT  THE ENDOMETRIAL CARCINOMA WAS ANALYZED FOR DNA MISMATCH REPAIR PROTEINS.IMMUNOHISTOCHEMICALLY, THE NEOPLASM RETAINED NUCLEAR EXPRESSION OF 4 GENE PRODUCTS, MLH1, MSH2, MSH6, AND PMS2, INVOLVED IN DNA MISMATCH REPAIR.POSITIVE AND NEGATIVE CONTROLS WORKED APPROPRIATELY.  PER REQUEST, AN ER AND PR ARE PERFORMED ON BLOCK 1I.  ER-  APPROXIMATELY 25-35% STAINING IN NEOPLASTIC CELLS (INTERMEDIATE)  PR- APPROXIMATELY 25-35% STAINING IN NEOPLASTIC CELLS (STRONG)  ER AND PR PREDOMINANTLY SHOW STAINING IN THE SEROUS COMPONENT.  DIAGNOSIS  1. UTERUS, CERVIX WITH BILATERAL FALLOPIAN TUBES AND OVARIES,  HYSTERECTOMY AND BILATERAL SALPINGO-OOPHORECTOMY:  HIGH GRADE/POORLY DIFFERENTIATED ENDOMETRIAL ADENOCARCINOMA WITH SOLID AND SEROUS PAPILLARY COMPONENTS.  HISTOPATHOLOGIC TYPE:A VARIETY OF PATTERNS WERE PRESENT, ALL OF WHICH SHOULD BE CONSIDERED TO BE HIGH GRADE.SEROUS PAPILLARY CARCINOMA WAS SEEN OCCURRING ADJACENT TO SOLID ENDOMETRIAL CARCINOMA WITH MARKED ANAPLASIA AND GIANT CELLS. SIZE:TUMOR MEASURED AT LEAST 4.8 CM IN GREATEST HORIZONTAL DIMENSION.  GRADE: POORLY DIFFERENTIATED OR HIGH GRADE. DEPTH OF INVASION: NO DEFINITE MYOMETRIAL INVOLVEMENT WAS SEEN.THE TUMOR APPEARED CONFINED TO THE POLYP AS WELL AS SURFACE ENDOMETRIUM. WHERE MYOMETRIAL THICKNESS NOT APPLICABLE. SEROSAL INVOLVEMENT: NOT DEMONSTRATED.  ENDOCERVICAL INVOLVEMENT:NOT DEMONSTRATED. RESECTION MARGINS: FREE OF INVOLVEMENT. EXTRAUTERINE EXTENSION:NOT DEMONSTRATED. ANGIOLYMPHATIC INVASION: NOT DEFINITELY SEEN. TOTAL NODES EXAMINED:22.  PELVIC NODES EXAMINED:20.  PELVIC NODES INVOLVED:0.  PARA-AORTIC NODES 2. EXAMINED:  PARA-AORTIC NODES 0. INVOLVED TNM STAGE: T1A N0 MX. AJCC STAGE GROUPING: IA. FIGO STAGE:IA.    08/26/2012 Imaging    No CT evidence for intra-abdominal or pelvic metastatic disease. Trace free pelvic fluid, presumably postoperative although the date of surgery is not documented in the electronic medical record.    08/26/2012 - 12/13/2012 Chemotherapy    She received 6 cycles of carbo/taxol     10/29/2012 - 11/27/2012 Radiation Therapy    May 27, June 5,  June 11, June 19, November 27, 2012: Proximal vagina 30 Gy in 5 fractions      01/17/2013  Imaging    1.  No evidence of recurrent or metastatic disease. 2.  No acute abnormality involving the abdomen or pelvis. 3.  Mild diffuse hepatic steatosis. 4.  Very small supraumbilical midline anterior abdominal wall hernia containing fat, unchanged    01/22/2014 Imaging    No evidence for metastatic or recurrent disease. 2. No bowel obstruction.  Normal appendix. 3. Small fat containing hernia, stable in appearance. 4. Status post hysterectomy and bilateral oophorectomy    02/19/2014 Imaging    No pulmonary lesions are identified. The abnormality on the chest x-ray is due to asymmetric left-sided sternoclavicular joint degenerative disease    10/12/2014 Imaging    New mild retroperitoneal lymphadenopathy in the left paraaortic region and proximal left common iliac chain, consistent with metastatic disease. No other sites of metastatic disease identified within the abdomen or pelvis.      11/27/2014 - 02/11/2015 Chemotherapy    She received 4 cycles of carbo/taxol     03/01/2015 PET scan    Single hypermetabolic small retroperitoneal lymph node along the aorta. 2. No evidence of metastatic disease otherwise in the abdomen or pelvis. No evidence local recurrence. 3. Intensely hypermetabolic enlarged nodule adjacent to the RIGHT lobe of thyroid gland. This presumably represents the biopsied lesion in clinician report which was found to be benign thyroid tissue.    04/05/2015 - 05/13/2015 Radiation Therapy    She received 50.4 gray in 28 fractions with simultaneous integrated boost to 56 gray    06/17/2015 Imaging    No acute process or evidence of metastatic disease in the abdomen or pelvis. Resolution of previously described retroperitoneal adenopathy. 2.  Possible constipation. 3. Atherosclerosis.    06/17/2015 Tumor Marker    Patient's tumor was tested for the following markers: CA125 Results of the tumor marker test  revealed 33    08/12/2015 Tumor Marker    Patient's tumor was tested for the  following markers: CA125 Results of the tumor marker test revealed 52    08/31/2015 PET scan    Development of right paratracheal hypermetabolic adenopathy, consistent with nodal metastasis. 2. The previously described isolated abdominal retroperitoneal hypermetabolic node has resolved. 3. Persistent hypermetabolic right thyroid nodule, per report previously biopsied. Correlate with those results. 4.  Possible constipation.    09/09/2015 -  Anti-estrogen oral therapy    She has been receiving alternative treatment between megace and tamoxifen    09/09/2015 Tumor Marker    Patient's tumor was tested for the following markers: CA125 Results of the tumor marker test revealed 37.8    09/20/2015 Procedure    Technically successful ultrasound-guided thyroid aspiration biopsy , dominant right nodule    09/20/2015 Pathology Results    THYROID, RIGHT, FINE NEEDLE ASPIRATION (SPECIMEN 1 OF 1 COLLECTED 09/20/15): FINDINGS CONSISTENT WITH BENIGN THYROID NODULE (BETHESDA CATEGORY II).    10/28/2015 Tumor Marker    Patient's tumor was tested for the following markers: CA125 Results of the tumor marker test revealed 53.2    11/04/2015 Imaging    Stable benign right thyroid nodule    12/16/2015 Tumor Marker    Patient's tumor was tested for the following markers: CA125 Results of the tumor marker test revealed 38.1    03/22/2016 PET scan    Interval progression of hypermetabolic right paratracheal lymph node consistent with metastatic involvement. 2. Stable hypermetabolic right thyroid nodule. Reportedly this has been biopsied in the past.    03/23/2016 Tumor Marker    Patient's tumor was tested for the following markers: CA125 Results of the tumor marker test revealed 62.4    04/13/2016 - 06/01/2016 Radiation Therapy    She received 56 Gy to the chest in 28 fractions     05/09/2016 Imaging    No evidence of left lower extremity deep vein thrombosis. No evidence of a superficial thrombosis of the  greater and lesser saphenous veins. Positive for thrombus noted in several varicosities of the calf. No evidence of Baker's cyst on the left.    05/17/2016 Tumor Marker    Patient's tumor was tested for the following markers: CA125 Results of the tumor marker test revealed 9    06/29/2016 Tumor Marker    Patient's tumor was tested for the following markers: CA125 Results of the tumor marker test revealed 5.9    08/04/2016 Tumor Marker    Patient's tumor was tested for the following markers: CA125 Results of the tumor marker test revealed 6.0    09/12/2016 PET scan    Complete metabolic response to therapy, with resolution of hypermetabolic mediastinal lymphadenopathy since prior exam. No residual or new metastatic disease identified. Stable hypermetabolic right thyroid lobe nodule, which was previously biopsied on 09/20/2015.    03/13/2017 PET scan    1. Solitary focus of recurrent right paratracheal hypermetabolic activity, with a 1.0 cm right lower paratracheal node having a maximum SUV of 9.0. Appearance compatible with recurrent malignancy. 2. Continued hypermetabolic right thyroid nodule, previously biopsied, presumed benign -correlate with prior biopsy results. 3. Other imaging findings of potential clinical significance: Aortic Atherosclerosis (ICD10-I70.0). Mild cardiomegaly. Prominent stool throughout the colon favors constipation.    04/02/2017 Pathology Results    FINE NEEDLE ASPIRATION, ENDOSCOPIC, (EBUS) 4 R NODE (SPECIMEN 1 OF 2 COLLECTED 04/02/17): MALIGNANT CELLS PRESENT, CONSISTENT WITH CARCINOMA. SEE COMMENT. COMMENT: THE MALIGNANT CELLS ARE POSITIVE FOR P53  AND NEGATIVE FOR ESTROGEN RECEPTOR AND TTF-1. THIS PROFILE IS NON-SPECIFIC, BUT THE P53 POSITIVE STAINING IS SUGGESTIVE OF GYNECOLOGIC PRIMARY.     04/11/2017 Procedure    Successful 8 French right internal jugular vein power port placement with its tip at the SVC/RA junction    04/12/2017 Imaging    Normal LV size  with mild LV hypertrophy. EF 55-60%. Normal RV size and systolic function. Aortic valve sclerosis without significant stenosis.    04/18/2017 - 06/14/2017 Chemotherapy    The patient had chemotherapy with Doxil     07/10/2017 Imaging    - Left ventricle: The cavity size was normal. There was mild concentric hypertrophy. Systolic function was normal. The estimated ejection fraction was in the range of 55% to 60%. Wall motion was normal; there were no regional wall motion abnormalities. Doppler parameters are consistent with abnormal left ventricular relaxation (grade 1 diastolic dysfunction). - Aortic valve: Noncoronary cusp mobility was mildly restricted. - Mitral valve: There was mild regurgitation. - Left atrium: The atrium was mildly dilated. - Atrial septum: No defect or patent foramen ovale was identified.  Impressions:  - Compared to November 2018, global LV longitudinal strain remains normal (has increased)    07/11/2017 PET scan    Increased size and hypermetabolic activity of 3.3 cm right thyroid lobe nodule. Thyroid carcinoma cannot be excluded. Recommend repeat ultrasound guided fine needle aspiration to exclude thyroid carcinoma.  New adjacent hypermetabolic 10 mm right supraclavicular lymph node, suspicious for lymph node metastasis.  Slight increase in size and hypermetabolic activity of solitary right paratracheal lymph node.  No evidence of abdominal or pelvic metastatic disease.    07/18/2017 Procedure    1. Technically successful ultrasound guided fine needle aspiration of indeterminate hypermetabolic right-sided thyroid nodule/mass. 2. Technically successful ultrasound-guided core needle biopsy of hypermetabolic right lower cervical lymph node.    07/18/2017 Pathology Results    THYROID, FINE NEEDLE ASPIRATION RIGHT (SPECIMEN 1 OF 1, COLLECTED ON 07/18/2017): ATYPIA OF UNDETERMINED SIGNIFICANCE OR FOLLICULAR LESION OF UNDETERMINED SIGNIFICANCE (BETHESDA CATEGORY III). SEE  COMMENT. COMMENT: THE SPECIMEN CONSISTS OF SMALL AND MEDIUM SIZED GROUPS OF FOLLICULAR EPITHELIAL CELLS WITH MILD CYTOLOGIC ATYPIA INCLUDING NUCLEAR ENLARGEMENT AND HURTHLE CELL CHANGE. SOME GROUPS ARE ARRANGED AS MICROFOLLICLES. THERE IS MINIMAL BACKGROUND COLLOID. BASED ON THESE FEATURES, A FOLLICULAR LESION/NEOPLASM CAN NOT BE ENTIRELY RULED OUT. A SPECIMEN WILL BE SENT FOR AFIRMA TESTING.    07/19/2017 Pathology Results    Lymph node, needle/core biopsy - METASTATIC PAPILLARY SEROUS CARCINOMA. - SEE COMMENT. Microscopic Comment Dr. Vicente Males has reviewed the case and concurs with this interpretation    10/15/2017 PET scan    No new or progressive disease. No evidence of abdominal or pelvic metastatic disease.  Stable hypermetabolic right thyroid lobe nodule and adjacent right supraclavicular lymph node.  Decreased size and hypermetabolic activity of solitary right paratracheal lymph node.    01/23/2018 PET scan    1. Overall, no significant change from PET-CT of 3 months ago. There is persistent hypermetabolic activity at the right thoracic inlet and within a right paratracheal mediastinal node. The nodes have not significantly changed in size, although the metabolic activity has minimally increased over this interval. It is uncertain how much of the thoracic inlet metabolic activity is attributed to the thyroid nodule versus adjacent cervical lymph nodes, both previously biopsied. 2. No new hypermetabolic activity within the neck, chest, abdomen or pelvis. No evidence of local recurrence in the pelvis. 3. Stable probable radiation changes in the  right lung.     REVIEW OF SYSTEMS:   Constitutional: Denies fevers, chills or abnormal weight loss Eyes: Denies blurriness of vision Ears, nose, mouth, throat, and face: Denies mucositis or sore throat Respiratory: Denies cough, dyspnea or wheezes Cardiovascular: Denies palpitation, chest discomfort or lower extremity  swelling Gastrointestinal:  Denies nausea, heartburn or change in bowel habits Skin: Denies abnormal skin rashes Lymphatics: Denies new lymphadenopathy or easy bruising Neurological:Denies numbness, tingling or new weaknesses Behavioral/Psych: Mood is stable, no new changes  All other systems were reviewed with the patient and are negative.  I have reviewed the past medical history, past surgical history, social history and family history with the patient and they are unchanged from previous note.  ALLERGIES:  has No Known Allergies.  MEDICATIONS:  Current Outpatient Medications  Medication Sig Dispense Refill  . amLODipine (NORVASC) 10 MG tablet TAKE 1 TABLET BY MOUTH DAILY. DECREASE SIMVASTATIN TO 20MG DAILY WHILE ON AMLODIPINE PER MD 90 tablet 0  . Calcium Carb-Cholecalciferol 500-600 MG-UNIT TABS Take 1 tablet by mouth daily.     . Cholecalciferol (VITAMIN D3) 2000 units TABS Take 2,000 Units by mouth daily.     . hydrochlorothiazide (HYDRODIURIL) 25 MG tablet Take 1 tablet (25 mg total) by mouth daily. 30 tablet 9  . LORazepam (ATIVAN) 0.5 MG tablet Take 0.5 mg by mouth 2 (two) times daily as needed.  0  . metoprolol tartrate (LOPRESSOR) 50 MG tablet TAKE 1 TABLET (50 MG TOTAL) BY MOUTH 2 (TWO) TIMES DAILY. 60 tablet 1  . simvastatin (ZOCOR) 40 MG tablet Take 40 mg by mouth every evening.    . tacrolimus (PROTOPIC) 0.1 % ointment Apply 1 application topically daily as needed (rash).     . venlafaxine XR (EFFEXOR-XR) 75 MG 24 hr capsule Take 75 mg by mouth daily with breakfast.      No current facility-administered medications for this visit.    Facility-Administered Medications Ordered in Other Visits  Medication Dose Route Frequency Provider Last Rate Last Dose  . bevacizumab (AVASTIN) 1,100 mg in sodium chloride 0.9 % 100 mL chemo infusion  14.8 mg/kg (Treatment Plan Recorded) Intravenous Once Alvy Bimler, Narya Beavin, MD      . heparin lock flush 100 unit/mL  500 Units Intracatheter Once PRN  Alvy Bimler, Jnae Thomaston, MD      . sodium chloride flush (NS) 0.9 % injection 10 mL  10 mL Intracatheter PRN Alvy Bimler, Lucienne Sawyers, MD        PHYSICAL EXAMINATION: ECOG PERFORMANCE STATUS: 1 - Symptomatic but completely ambulatory  Vitals:   03/07/18 0853  BP: 128/70  Pulse: 62  Resp: 18  Temp: 98 F (36.7 C)  SpO2: 98%   Filed Weights   03/07/18 0853  Weight: 159 lb 1.6 oz (72.2 kg)    GENERAL:alert, no distress and comfortable SKIN: skin color, texture, turgor are normal, no rashes or significant lesions EYES: normal, Conjunctiva are pink and non-injected, sclera clear OROPHARYNX:no exudate, no erythema and lips, buccal mucosa, and tongue normal  NECK: supple, thyroid normal size, non-tender, without nodularity LYMPH:  no palpable lymphadenopathy in the cervical, axillary or inguinal LUNGS: clear to auscultation and percussion with normal breathing effort HEART: regular rate & rhythm and no murmurs and no lower extremity edema ABDOMEN:abdomen soft, non-tender and normal bowel sounds Musculoskeletal:no cyanosis of digits and no clubbing  NEURO: alert & oriented x 3 with fluent speech, no focal motor/sensory deficits  LABORATORY DATA:  I have reviewed the data as listed    Component  Value Date/Time   NA 143 03/07/2018 0823   NA 141 05/17/2017 0957   K 4.0 03/07/2018 0823   K 3.6 05/17/2017 0957   CL 106 03/07/2018 0823   CL 105 11/19/2012 0848   CO2 29 03/07/2018 0823   CO2 25 05/17/2017 0957   GLUCOSE 94 03/07/2018 0823   GLUCOSE 85 05/17/2017 0957   GLUCOSE 122 (H) 11/19/2012 0848   BUN 16 03/07/2018 0823   BUN 15.2 05/17/2017 0957   CREATININE 0.77 03/07/2018 0823   CREATININE 0.8 05/17/2017 0957   CALCIUM 9.9 03/07/2018 0823   CALCIUM 9.6 05/17/2017 0957   PROT 7.0 03/07/2018 0823   PROT 7.0 05/17/2017 0957   ALBUMIN 3.6 03/07/2018 0823   ALBUMIN 3.9 05/17/2017 0957   AST 24 03/07/2018 0823   AST 25 05/17/2017 0957   ALT 22 03/07/2018 0823   ALT 18 05/17/2017 0957    ALKPHOS 54 03/07/2018 0823   ALKPHOS 57 05/17/2017 0957   BILITOT 0.4 03/07/2018 0823   BILITOT 0.47 05/17/2017 0957   GFRNONAA >60 03/07/2018 0823   GFRAA >60 03/07/2018 0823    No results found for: SPEP, UPEP  Lab Results  Component Value Date   WBC 3.3 (L) 03/07/2018   NEUTROABS 2.3 03/07/2018   HGB 12.4 03/07/2018   HCT 36.2 03/07/2018   MCV 94.4 03/07/2018   PLT 186 03/07/2018      Chemistry      Component Value Date/Time   NA 143 03/07/2018 0823   NA 141 05/17/2017 0957   K 4.0 03/07/2018 0823   K 3.6 05/17/2017 0957   CL 106 03/07/2018 0823   CL 105 11/19/2012 0848   CO2 29 03/07/2018 0823   CO2 25 05/17/2017 0957   BUN 16 03/07/2018 0823   BUN 15.2 05/17/2017 0957   CREATININE 0.77 03/07/2018 0823   CREATININE 0.8 05/17/2017 0957   GLU 158 (H) 01/14/2015 1035      Component Value Date/Time   CALCIUM 9.9 03/07/2018 0823   CALCIUM 9.6 05/17/2017 0957   ALKPHOS 54 03/07/2018 0823   ALKPHOS 57 05/17/2017 0957   AST 24 03/07/2018 0823   AST 25 05/17/2017 0957   ALT 22 03/07/2018 0823   ALT 18 05/17/2017 0957   BILITOT 0.4 03/07/2018 0823   BILITOT 0.47 05/17/2017 0957       All questions were answered. The patient knows to call the clinic with any problems, questions or concerns. No barriers to learning was detected.  I spent 15 minutes counseling the patient face to face. The total time spent in the appointment was 20 minutes and more than 50% was on counseling and review of test results  [name], MD 03/07/2018 9:53 AM

## 2018-03-07 NOTE — Patient Instructions (Signed)
[facility] Discharge Instructions for Patients Receiving Chemotherapy  Today you received the following chemotherapy agents: Bevacizumab (Avastin)  To help prevent nausea and vomiting after your treatment, we encourage you to take your nausea medication as directed.    If you develop nausea and vomiting that is not controlled by your nausea medication, call the clinic.   BELOW ARE SYMPTOMS THAT SHOULD BE REPORTED IMMEDIATELY:  *FEVER GREATER THAN 100.5 F  *CHILLS WITH OR WITHOUT FEVER  NAUSEA AND VOMITING THAT IS NOT CONTROLLED WITH YOUR NAUSEA MEDICATION  *UNUSUAL SHORTNESS OF BREATH  *UNUSUAL BRUISING OR BLEEDING  TENDERNESS IN MOUTH AND THROAT WITH OR WITHOUT PRESENCE OF ULCERS  *URINARY PROBLEMS  *BOWEL PROBLEMS  UNUSUAL RASH Items with * indicate a potential emergency and should be followed up as soon as possible.  Feel free to call the clinic should you have any questions or concerns. The clinic phone number is [phone].  Please show the CHEMO ALERT CARD at check-in to the Emergency Department and triage nurse.   

## 2018-03-07 NOTE — Assessment & Plan Note (Signed)
Her blood pressure is better controlled She will continue close blood pressure monitoring at home and to continue on current prescription antihypertensives

## 2018-03-07 NOTE — Assessment & Plan Note (Signed)
Overall, she has stable disease We will continue treatment indefinitely I plan to repeat imaging study again in 3 months, due next in November 2019

## 2018-03-07 NOTE — Assessment & Plan Note (Signed)
The patient had bilateral cataracts and is contemplating surgery If her next PET CT scan showed no significant signs of disease progression, I recommend holding off Avastin to allow cataract surgery

## 2018-03-07 NOTE — Telephone Encounter (Signed)
Gave avs and calendar ° °

## 2018-03-08 DIAGNOSIS — F064 Anxiety disorder due to known physiological condition: Secondary | ICD-10-CM | POA: Diagnosis not present

## 2018-03-08 DIAGNOSIS — F4320 Adjustment disorder, unspecified: Secondary | ICD-10-CM | POA: Diagnosis not present

## 2018-03-08 DIAGNOSIS — Z79891 Long term (current) use of opiate analgesic: Secondary | ICD-10-CM | POA: Diagnosis not present

## 2018-03-08 MED FILL — busPIRone HCL 10 MG TABS: 10 | 30 days supply | Qty: 60 | Fill #0

## 2018-03-11 DIAGNOSIS — L308 Other specified dermatitis: Secondary | ICD-10-CM | POA: Diagnosis not present

## 2018-03-11 MED FILL — DESOXIMETASONE 0.25% OINT: 0.25 | 60 days supply | Qty: 60 | Fill #0

## 2018-03-12 MED FILL — LIDOCAINE-PRILOCAINE CREAM: 2.5-2.5 | 30 days supply | Qty: 30 | Fill #1

## 2018-03-21 ENCOUNTER — Other Ambulatory Visit: Payer: Self-pay | Admitting: Hematology and Oncology

## 2018-03-21 MED FILL — METOPROLOL TARTRATE 50 MG T: 50 | 30 days supply | Qty: 60 | Fill #0

## 2018-03-28 ENCOUNTER — Inpatient Hospital Stay: Payer: 59

## 2018-03-28 VITALS — BP 144/83 | HR 65 | Temp 98.0°F | Resp 17 | Ht 63.0 in | Wt 162.2 lb

## 2018-03-28 DIAGNOSIS — C770 Secondary and unspecified malignant neoplasm of lymph nodes of head, face and neck: Secondary | ICD-10-CM

## 2018-03-28 DIAGNOSIS — I10 Essential (primary) hypertension: Secondary | ICD-10-CM

## 2018-03-28 DIAGNOSIS — Z7189 Other specified counseling: Secondary | ICD-10-CM

## 2018-03-28 DIAGNOSIS — Z9221 Personal history of antineoplastic chemotherapy: Secondary | ICD-10-CM | POA: Diagnosis not present

## 2018-03-28 DIAGNOSIS — Z923 Personal history of irradiation: Secondary | ICD-10-CM | POA: Diagnosis not present

## 2018-03-28 DIAGNOSIS — H269 Unspecified cataract: Secondary | ICD-10-CM | POA: Diagnosis not present

## 2018-03-28 DIAGNOSIS — C541 Malignant neoplasm of endometrium: Secondary | ICD-10-CM

## 2018-03-28 DIAGNOSIS — Z79899 Other long term (current) drug therapy: Secondary | ICD-10-CM | POA: Diagnosis not present

## 2018-03-28 DIAGNOSIS — F419 Anxiety disorder, unspecified: Secondary | ICD-10-CM | POA: Diagnosis not present

## 2018-03-28 DIAGNOSIS — Z95828 Presence of other vascular implants and grafts: Secondary | ICD-10-CM

## 2018-03-28 DIAGNOSIS — Z5112 Encounter for antineoplastic immunotherapy: Secondary | ICD-10-CM | POA: Diagnosis not present

## 2018-03-28 LAB — COMPREHENSIVE METABOLIC PANEL
ALT: 17 U/L (ref 0–44)
AST: 20 U/L (ref 15–41)
Albumin: 3.5 g/dL (ref 3.5–5.0)
Alkaline Phosphatase: 56 U/L (ref 38–126)
Anion gap: 13 (ref 5–15)
BUN: 19 mg/dL (ref 8–23)
CO2: 25 mmol/L (ref 22–32)
Calcium: 9.6 mg/dL (ref 8.9–10.3)
Chloride: 103 mmol/L (ref 98–111)
Creatinine, Ser: 0.85 mg/dL (ref 0.44–1.00)
GFR calc Af Amer: >60 mL/min (ref >60)
GFR calc non Af Amer: >60 mL/min (ref >60)
Glucose, Bld: 123 mg/dL — ABNORMAL HIGH (ref 70–99)
Potassium: 4.1 mmol/L (ref 3.5–5.1)
Sodium: 141 mmol/L (ref 135–145)
Total Bilirubin: 0.4 mg/dL (ref 0.3–1.2)
Total Protein: 6.9 g/dL (ref 6.5–8.1)

## 2018-03-28 LAB — CBC WITH DIFFERENTIAL (CANCER CENTER ONLY)
Abs Immature Granulocytes: 0.01 10*3/uL (ref 0.00–0.07)
Basophils Absolute: 0 10*3/uL (ref 0.0–0.1)
Basophils Relative: 1 %
Eosinophils Absolute: 0.3 10*3/uL (ref 0.0–0.5)
Eosinophils Relative: 7 %
HCT: 37.2 % (ref 36.0–46.0)
Hemoglobin: 12.3 g/dL (ref 12.0–15.0)
Immature Granulocytes: 0 %
Lymphocytes Relative: 17 %
Lymphs Abs: 0.6 10*3/uL — ABNORMAL LOW (ref 0.7–4.0)
MCH: 31.6 pg (ref 26.0–34.0)
MCHC: 33.1 g/dL (ref 30.0–36.0)
MCV: 95.6 fL (ref 80.0–100.0)
Monocytes Absolute: 0.3 10*3/uL (ref 0.1–1.0)
Monocytes Relative: 9 %
Neutro Abs: 2.4 10*3/uL (ref 1.7–7.7)
Neutrophils Relative %: 66 %
Platelet Count: 175 10*3/uL (ref 150–400)
RBC: 3.89 MIL/uL (ref 3.87–5.11)
RDW: 12.4 % (ref 11.5–15.5)
WBC Count: 3.7 10*3/uL — ABNORMAL LOW (ref 4.0–10.5)
nRBC: 0 % (ref 0.0–0.2)

## 2018-03-28 MED ORDER — SODIUM CHLORIDE 0.9 % IV SOLN
Freq: Once | INTRAVENOUS | Status: AC
  Administered 2018-03-28: 09:00:00 via INTRAVENOUS
  Filled 2018-03-28: qty 250

## 2018-03-28 MED ORDER — SODIUM CHLORIDE 0.9% FLUSH
10.0000 mL | Freq: Once | INTRAVENOUS | Status: AC
  Administered 2018-03-28: 10 mL
  Filled 2018-03-28: qty 10

## 2018-03-28 MED ORDER — SODIUM CHLORIDE 0.9% FLUSH
10.0000 mL | INTRAVENOUS | Status: DC | PRN
  Administered 2018-03-28: 10 mL
  Filled 2018-03-28: qty 10

## 2018-03-28 MED ORDER — HEPARIN SOD (PORK) LOCK FLUSH 100 UNIT/ML IV SOLN
500.0000 [IU] | Freq: Once | INTRAVENOUS | Status: AC | PRN
  Administered 2018-03-28: 500 [IU]
  Filled 2018-03-28: qty 5

## 2018-03-28 MED ORDER — SODIUM CHLORIDE 0.9 % IV SOLN
14.8000 mg/kg | Freq: Once | INTRAVENOUS | Status: AC
  Administered 2018-03-28: 1100 mg via INTRAVENOUS
  Filled 2018-03-28: qty 32

## 2018-03-28 NOTE — Patient Instructions (Signed)
[facility] Discharge Instructions for Patients Receiving Chemotherapy  Today you received the following chemotherapy agents: Bevicazumab (Avastin)  To help prevent nausea and vomiting after your treatment, we encourage you to take your nausea medication as prescribed.    If you develop nausea and vomiting that is not controlled by your nausea medication, call the clinic.   BELOW ARE SYMPTOMS THAT SHOULD BE REPORTED IMMEDIATELY:  *FEVER GREATER THAN 100.5 F  *CHILLS WITH OR WITHOUT FEVER  NAUSEA AND VOMITING THAT IS NOT CONTROLLED WITH YOUR NAUSEA MEDICATION  *UNUSUAL SHORTNESS OF BREATH  *UNUSUAL BRUISING OR BLEEDING  TENDERNESS IN MOUTH AND THROAT WITH OR WITHOUT PRESENCE OF ULCERS  *URINARY PROBLEMS  *BOWEL PROBLEMS  UNUSUAL RASH Items with * indicate a potential emergency and should be followed up as soon as possible.  Feel free to call the clinic should you have any questions or concerns. The clinic phone number is [phone].  Please show the [place] at check-in to the Emergency Department and triage nurse.

## 2018-04-01 ENCOUNTER — Other Ambulatory Visit: Payer: Self-pay | Admitting: Hematology and Oncology

## 2018-04-01 MED FILL — AMLODIPINE BESYLATE 10 MG T: 10 | 90 days supply | Qty: 90 | Fill #0

## 2018-04-10 DIAGNOSIS — L239 Allergic contact dermatitis, unspecified cause: Secondary | ICD-10-CM | POA: Diagnosis not present

## 2018-04-10 DIAGNOSIS — L0889 Other specified local infections of the skin and subcutaneous tissue: Secondary | ICD-10-CM | POA: Diagnosis not present

## 2018-04-11 ENCOUNTER — Telehealth: Payer: Self-pay | Admitting: Hematology and Oncology

## 2018-04-11 NOTE — Telephone Encounter (Signed)
NG out 10/30 thru 11/29. Moved 11/14 f/u to [facility] and adjusted lab/tx. Spoke with patient.

## 2018-04-12 MED FILL — busPIRone HCL 10 MG TABS: 10 | 30 days supply | Qty: 60 | Fill #1

## 2018-04-16 ENCOUNTER — Encounter (HOSPITAL_COMMUNITY)
Admission: RE | Admit: 2018-04-16 | Discharge: 2018-04-16 | Disposition: A | Discharge status: Home / Self Care | Payer: 59 | Source: Ambulatory Visit | Attending: Hematology and Oncology | Admitting: Hematology and Oncology

## 2018-04-16 DIAGNOSIS — C541 Malignant neoplasm of endometrium: Secondary | ICD-10-CM | POA: Insufficient documentation

## 2018-04-16 LAB — GLUCOSE, CAPILLARY: Glucose-Capillary: 81 mg/dL (ref 70–99)

## 2018-04-16 MED ORDER — FLUDEOXYGLUCOSE F - 18 (FDG) INJECTION
8.1000 | Freq: Once | INTRAVENOUS | Status: AC
  Administered 2018-04-16: 8.1 via INTRAVENOUS

## 2018-04-18 ENCOUNTER — Inpatient Hospital Stay: Payer: 59 | Attending: Hematology and Oncology

## 2018-04-18 ENCOUNTER — Other Ambulatory Visit: Payer: 59

## 2018-04-18 ENCOUNTER — Inpatient Hospital Stay: Payer: 59

## 2018-04-18 ENCOUNTER — Ambulatory Visit: Payer: 59

## 2018-04-18 ENCOUNTER — Inpatient Hospital Stay (HOSPITAL_BASED_OUTPATIENT_CLINIC_OR_DEPARTMENT_OTHER): Payer: 59 | Admitting: Hematology and Oncology

## 2018-04-18 ENCOUNTER — Ambulatory Visit: Payer: 59 | Admitting: Oncology

## 2018-04-18 DIAGNOSIS — C541 Malignant neoplasm of endometrium: Secondary | ICD-10-CM | POA: Diagnosis not present

## 2018-04-18 DIAGNOSIS — C770 Secondary and unspecified malignant neoplasm of lymph nodes of head, face and neck: Secondary | ICD-10-CM | POA: Diagnosis not present

## 2018-04-18 DIAGNOSIS — Z79899 Other long term (current) drug therapy: Secondary | ICD-10-CM | POA: Diagnosis not present

## 2018-04-18 DIAGNOSIS — Z923 Personal history of irradiation: Secondary | ICD-10-CM | POA: Insufficient documentation

## 2018-04-18 DIAGNOSIS — Z5112 Encounter for antineoplastic immunotherapy: Secondary | ICD-10-CM | POA: Diagnosis not present

## 2018-04-18 DIAGNOSIS — Z7189 Other specified counseling: Secondary | ICD-10-CM

## 2018-04-18 DIAGNOSIS — Z95828 Presence of other vascular implants and grafts: Secondary | ICD-10-CM

## 2018-04-18 DIAGNOSIS — Z9221 Personal history of antineoplastic chemotherapy: Secondary | ICD-10-CM | POA: Diagnosis not present

## 2018-04-18 DIAGNOSIS — I10 Essential (primary) hypertension: Secondary | ICD-10-CM

## 2018-04-18 LAB — CBC WITH DIFFERENTIAL (CANCER CENTER ONLY)
Abs Immature Granulocytes: 0.01 10*3/uL (ref 0.00–0.07)
Basophils Absolute: 0.1 10*3/uL (ref 0.0–0.1)
Basophils Relative: 1 %
Eosinophils Absolute: 0.2 10*3/uL (ref 0.0–0.5)
Eosinophils Relative: 3 %
HCT: 36.5 % (ref 36.0–46.0)
Hemoglobin: 12 g/dL (ref 12.0–15.0)
Immature Granulocytes: 0 %
Lymphocytes Relative: 14 %
Lymphs Abs: 0.7 10*3/uL (ref 0.7–4.0)
MCH: 31.8 pg (ref 26.0–34.0)
MCHC: 32.9 g/dL (ref 30.0–36.0)
MCV: 96.8 fL (ref 80.0–100.0)
Monocytes Absolute: 0.4 10*3/uL (ref 0.1–1.0)
Monocytes Relative: 7 %
Neutro Abs: 3.9 10*3/uL (ref 1.7–7.7)
Neutrophils Relative %: 75 %
Platelet Count: 179 10*3/uL (ref 150–400)
RBC: 3.77 MIL/uL — ABNORMAL LOW (ref 3.87–5.11)
RDW: 12.4 % (ref 11.5–15.5)
WBC Count: 5.2 10*3/uL (ref 4.0–10.5)
nRBC: 0 % (ref 0.0–0.2)

## 2018-04-18 LAB — COMPREHENSIVE METABOLIC PANEL
ALT: 16 U/L (ref 0–44)
AST: 21 U/L (ref 15–41)
Albumin: 3.5 g/dL (ref 3.5–5.0)
Alkaline Phosphatase: 57 U/L (ref 38–126)
Anion gap: 10 (ref 5–15)
BUN: 25 mg/dL — ABNORMAL HIGH (ref 8–23)
CO2: 26 mmol/L (ref 22–32)
Calcium: 9.5 mg/dL (ref 8.9–10.3)
Chloride: 102 mmol/L (ref 98–111)
Creatinine, Ser: 1.02 mg/dL — ABNORMAL HIGH (ref 0.44–1.00)
GFR calc Af Amer: >60 mL/min (ref >60)
GFR calc non Af Amer: 57 mL/min — ABNORMAL LOW (ref >60)
Glucose, Bld: 130 mg/dL — ABNORMAL HIGH (ref 70–99)
Potassium: 4.1 mmol/L (ref 3.5–5.1)
Sodium: 138 mmol/L (ref 135–145)
Total Bilirubin: 0.4 mg/dL (ref 0.3–1.2)
Total Protein: 6.9 g/dL (ref 6.5–8.1)

## 2018-04-18 MED ORDER — SODIUM CHLORIDE 0.9% FLUSH
10.0000 mL | INTRAVENOUS | Status: DC | PRN
  Administered 2018-04-18: 10 mL
  Filled 2018-04-18: qty 10

## 2018-04-18 MED ORDER — HEPARIN SOD (PORK) LOCK FLUSH 100 UNIT/ML IV SOLN
500.0000 [IU] | Freq: Once | INTRAVENOUS | Status: AC | PRN
  Administered 2018-04-18: 500 [IU]
  Filled 2018-04-18: qty 5

## 2018-04-18 MED ORDER — SODIUM CHLORIDE 0.9% FLUSH
10.0000 mL | Freq: Once | INTRAVENOUS | Status: AC
  Administered 2018-04-18: 10 mL
  Filled 2018-04-18: qty 10

## 2018-04-18 MED ORDER — SODIUM CHLORIDE 0.9 % IV SOLN
14.8000 mg/kg | Freq: Once | INTRAVENOUS | Status: AC
  Administered 2018-04-18: 1100 mg via INTRAVENOUS
  Filled 2018-04-18: qty 32

## 2018-04-18 MED ORDER — SODIUM CHLORIDE 0.9 % IV SOLN
Freq: Once | INTRAVENOUS | Status: DC
  Filled 2018-04-18: qty 250

## 2018-04-18 NOTE — Progress Notes (Signed)
Patient Care Team: [name], MD as PCP - General (Family Medicine) Reynold Bowen, MD as Consulting Physician (Endocrinology)  DIAGNOSIS:  Encounter Diagnosis  Name Primary?  . Endometrial cancer ([place])     SUMMARY OF ONCOLOGIC HISTORY: Oncology History   [employer] testing done 11-2015 on surgical path from 2014: MS stable TMB low 4 muts/mb ATM [id number] ERBB3 T389K E2H2 rearrangement exon 9 PPP2R1A P179R TP53 I195N    ER- APPROXIMATELY 25-35% STAINING IN NEOPLASTIC CELLS (INTERMEDIATE)  PR- APPROXIMATELY 25-35% STAINING IN NEOPLASTIC CELLS (STRONG)  Repeat biopsy 04/02/17: ER negative, Her 2 negative     Endometrial cancer ([place])   06/20/2012 Pathology Results    Biopsy positive for papillary serous carcinoma    06/20/2012 Genetic Testing    [employer] One testing done 11-2015 on surgical path from 2014: MS stable TMB low 4 muts/mb ATM [id number] ERBB3 T389K E2H2 rearrangement exon 9 PPP2R1A P179R TP53 I195N     06/20/2012 Initial Diagnosis    Patient presented to PCP with intermittent vaginal bleeding since ~ Oct 2013, endometrial biopsy 06-20-12 with complex endometrial hyperplasia with atypia    06/26/2012 Imaging    Thickened endometrial lining in a postmenopausal patient experiencing vaginal bleeding. In the setting of post-menopausal bleeding, endometrial sampling is indicated to exclude carcinoma. No focal myometrial abnormalities are seen.  Normal left ovary and non-visualized right ovary    07/30/2012 Surgery    Dr. Polly Cobia performed robotic hysterectomy with bilateral salpingo-oophorectomy, bilateral pelvic lymph node dissection and periaortic lymph node dissection. Intraoperatively on frozen section, the patient was noted to have a large endometrial polyp with changes within the polyp consistent for high-grade malignancy, possibly papillary serous carcinoma. There is no obvious extrauterine disease noted.      07/30/2012  Pathology Results    [id number] SUPPLEMENTAL REPORT  THE ENDOMETRIAL CARCINOMA WAS ANALYZED FOR DNA MISMATCH REPAIR PROTEINS.IMMUNOHISTOCHEMICALLY, THE NEOPLASM RETAINED NUCLEAR EXPRESSION OF 4 GENE PRODUCTS, MLH1, MSH2, MSH6, AND PMS2, INVOLVED IN DNA MISMATCH REPAIR.POSITIVE AND NEGATIVE CONTROLS WORKED APPROPRIATELY.  PER REQUEST, AN ER AND PR ARE PERFORMED ON BLOCK 1I.  ER- APPROXIMATELY 25-35% STAINING IN NEOPLASTIC CELLS (INTERMEDIATE)  PR- APPROXIMATELY 25-35% STAINING IN NEOPLASTIC CELLS (STRONG)  ER AND PR PREDOMINANTLY SHOW STAINING IN THE SEROUS COMPONENT.  DIAGNOSIS  1. UTERUS, CERVIX WITH BILATERAL FALLOPIAN TUBES AND OVARIES,  HYSTERECTOMY AND BILATERAL SALPINGO-OOPHORECTOMY:  HIGH GRADE/POORLY DIFFERENTIATED ENDOMETRIAL ADENOCARCINOMA WITH SOLID AND SEROUS PAPILLARY COMPONENTS.  HISTOPATHOLOGIC TYPE:A VARIETY OF PATTERNS WERE PRESENT, ALL OF WHICH SHOULD BE CONSIDERED TO BE HIGH GRADE.SEROUS PAPILLARY CARCINOMA WAS SEEN OCCURRING ADJACENT TO SOLID ENDOMETRIAL CARCINOMA WITH MARKED ANAPLASIA AND GIANT CELLS. SIZE:TUMOR MEASURED AT LEAST 4.8 CM IN GREATEST HORIZONTAL DIMENSION.  GRADE: POORLY DIFFERENTIATED OR HIGH GRADE. DEPTH OF INVASION: NO DEFINITE MYOMETRIAL INVOLVEMENT WAS SEEN.THE TUMOR APPEARED CONFINED TO THE POLYP AS WELL AS SURFACE ENDOMETRIUM. WHERE MYOMETRIAL THICKNESS NOT APPLICABLE. SEROSAL INVOLVEMENT: NOT DEMONSTRATED.  ENDOCERVICAL INVOLVEMENT:NOT DEMONSTRATED. RESECTION MARGINS: FREE OF INVOLVEMENT. EXTRAUTERINE EXTENSION:NOT DEMONSTRATED. ANGIOLYMPHATIC INVASION: NOT DEFINITELY SEEN. TOTAL NODES EXAMINED:22.  PELVIC NODES EXAMINED:20.  PELVIC NODES INVOLVED:0.  PARA-AORTIC NODES 2. EXAMINED:  PARA-AORTIC NODES 0. INVOLVED TNM STAGE: T1A N0 MX. AJCC STAGE GROUPING: IA. FIGO STAGE:IA.    08/26/2012 Imaging    No CT evidence for intra-abdominal or  pelvic metastatic disease. Trace free pelvic fluid, presumably postoperative although the date of surgery is not documented in the electronic medical record.    08/26/2012 - 12/13/2012 Chemotherapy    She received 6 cycles of carbo/taxol  10/29/2012 - 11/27/2012 Radiation Therapy    May 27, June 5,  June 11, June 19, November 27, 2012: Proximal vagina 30 Gy in 5 fractions      01/17/2013 Imaging    1.  No evidence of recurrent or metastatic disease. 2.  No acute abnormality involving the abdomen or pelvis. 3.  Mild diffuse hepatic steatosis. 4.  Very small supraumbilical midline anterior abdominal wall hernia containing fat, unchanged    01/22/2014 Imaging    No evidence for metastatic or recurrent disease. 2. No bowel obstruction.  Normal appendix. 3. Small fat containing hernia, stable in appearance. 4. Status post hysterectomy and bilateral oophorectomy    02/19/2014 Imaging    No pulmonary lesions are identified. The abnormality on the chest x-ray is due to asymmetric left-sided sternoclavicular joint degenerative disease    10/12/2014 Imaging    New mild retroperitoneal lymphadenopathy in the left paraaortic region and proximal left common iliac chain, consistent with metastatic disease. No other sites of metastatic disease identified within the abdomen or pelvis.      11/27/2014 - 02/11/2015 Chemotherapy    She received 4 cycles of carbo/taxol     03/01/2015 PET scan    Single hypermetabolic small retroperitoneal lymph node along the aorta. 2. No evidence of metastatic disease otherwise in the abdomen or pelvis. No evidence local recurrence. 3. Intensely hypermetabolic enlarged nodule adjacent to the RIGHT lobe of thyroid gland. This presumably represents the biopsied lesion in clinician report which was found to be benign thyroid tissue.    04/05/2015 - 05/13/2015 Radiation Therapy    She received 50.4 gray in 28 fractions with simultaneous integrated boost to 56 gray    06/17/2015  Imaging    No acute process or evidence of metastatic disease in the abdomen or pelvis. Resolution of previously described retroperitoneal adenopathy. 2.  Possible constipation. 3. Atherosclerosis.    06/17/2015 Tumor Marker    Patient's tumor was tested for the following markers: CA125 Results of the tumor marker test revealed 33    08/12/2015 Tumor Marker    Patient's tumor was tested for the following markers: CA125 Results of the tumor marker test revealed 52    08/31/2015 PET scan    Development of right paratracheal hypermetabolic adenopathy, consistent with nodal metastasis. 2. The previously described isolated abdominal retroperitoneal hypermetabolic node has resolved. 3. Persistent hypermetabolic right thyroid nodule, per report previously biopsied. Correlate with those results. 4.  Possible constipation.    09/09/2015 -  Anti-estrogen oral therapy    She has been receiving alternative treatment between megace and tamoxifen    09/09/2015 Tumor Marker    Patient's tumor was tested for the following markers: CA125 Results of the tumor marker test revealed 37.8    09/20/2015 Procedure    Technically successful ultrasound-guided thyroid aspiration biopsy , dominant right nodule    09/20/2015 Pathology Results    THYROID, RIGHT, FINE NEEDLE ASPIRATION (SPECIMEN 1 OF 1 COLLECTED 09/20/15): FINDINGS CONSISTENT WITH BENIGN THYROID NODULE (BETHESDA CATEGORY II).    10/28/2015 Tumor Marker    Patient's tumor was tested for the following markers: CA125 Results of the tumor marker test revealed 53.2    11/04/2015 Imaging    Stable benign right thyroid nodule    12/16/2015 Tumor Marker    Patient's tumor was tested for the following markers: CA125 Results of the tumor marker test revealed 38.1    03/22/2016 PET scan    Interval progression of hypermetabolic right paratracheal lymph node consistent with  metastatic involvement. 2. Stable hypermetabolic right thyroid nodule. Reportedly this has  been biopsied in the past.    03/23/2016 Tumor Marker    Patient's tumor was tested for the following markers: CA125 Results of the tumor marker test revealed 62.4    04/13/2016 - 06/01/2016 Radiation Therapy    She received 56 Gy to the chest in 28 fractions     05/09/2016 Imaging    No evidence of left lower extremity deep vein thrombosis. No evidence of a superficial thrombosis of the greater and lesser saphenous veins. Positive for thrombus noted in several varicosities of the calf. No evidence of Baker's cyst on the left.    05/17/2016 Tumor Marker    Patient's tumor was tested for the following markers: CA125 Results of the tumor marker test revealed 9    06/29/2016 Tumor Marker    Patient's tumor was tested for the following markers: CA125 Results of the tumor marker test revealed 5.9    08/04/2016 Tumor Marker    Patient's tumor was tested for the following markers: CA125 Results of the tumor marker test revealed 6.0    09/12/2016 PET scan    Complete metabolic response to therapy, with resolution of hypermetabolic mediastinal lymphadenopathy since prior exam. No residual or new metastatic disease identified. Stable hypermetabolic right thyroid lobe nodule, which was previously biopsied on 09/20/2015.    03/13/2017 PET scan    1. Solitary focus of recurrent right paratracheal hypermetabolic activity, with a 1.0 cm right lower paratracheal node having a maximum SUV of 9.0. Appearance compatible with recurrent malignancy. 2. Continued hypermetabolic right thyroid nodule, previously biopsied, presumed benign -correlate with prior biopsy results. 3. Other imaging findings of potential clinical significance: Aortic Atherosclerosis (ICD10-I70.0). Mild cardiomegaly. Prominent stool throughout the colon favors constipation.    04/02/2017 Pathology Results    FINE NEEDLE ASPIRATION, ENDOSCOPIC, (EBUS) 4 R NODE (SPECIMEN 1 OF 2 COLLECTED 04/02/17): MALIGNANT CELLS PRESENT, CONSISTENT WITH  CARCINOMA. SEE COMMENT. COMMENT: THE MALIGNANT CELLS ARE POSITIVE FOR P53 AND NEGATIVE FOR ESTROGEN RECEPTOR AND TTF-1. THIS PROFILE IS NON-SPECIFIC, BUT THE P53 POSITIVE STAINING IS SUGGESTIVE OF GYNECOLOGIC PRIMARY.     04/11/2017 Procedure    Successful 8 French right internal jugular vein power port placement with its tip at the SVC/RA junction    04/12/2017 Imaging    Normal LV size with mild LV hypertrophy. EF 55-60%. Normal RV size and systolic function. Aortic valve sclerosis without significant stenosis.    04/18/2017 - 06/14/2017 Chemotherapy    The patient had chemotherapy with Doxil     07/10/2017 Imaging    - Left ventricle: The cavity size was normal. There was mild concentric hypertrophy. Systolic function was normal. The estimated ejection fraction was in the range of 55% to 60%. Wall motion was normal; there were no regional wall motion abnormalities. Doppler parameters are consistent with abnormal left ventricular relaxation (grade 1 diastolic dysfunction). - Aortic valve: Noncoronary cusp mobility was mildly restricted. - Mitral valve: There was mild regurgitation. - Left atrium: The atrium was mildly dilated. - Atrial septum: No defect or patent foramen ovale was identified.  Impressions:  - Compared to November 2018, global LV longitudinal strain remains normal (has increased)    07/11/2017 PET scan    Increased size and hypermetabolic activity of 3.3 cm right thyroid lobe nodule. Thyroid carcinoma cannot be excluded. Recommend repeat ultrasound guided fine needle aspiration to exclude thyroid carcinoma.  New adjacent hypermetabolic 10 mm right supraclavicular lymph node, suspicious for lymph  node metastasis.  Slight increase in size and hypermetabolic activity of solitary right paratracheal lymph node.  No evidence of abdominal or pelvic metastatic disease.    07/18/2017 Procedure    1. Technically successful ultrasound guided fine needle aspiration of indeterminate  hypermetabolic right-sided thyroid nodule/mass. 2. Technically successful ultrasound-guided core needle biopsy of hypermetabolic right lower cervical lymph node.    07/18/2017 Pathology Results    THYROID, FINE NEEDLE ASPIRATION RIGHT (SPECIMEN 1 OF 1, COLLECTED ON 07/18/2017): ATYPIA OF UNDETERMINED SIGNIFICANCE OR FOLLICULAR LESION OF UNDETERMINED SIGNIFICANCE (BETHESDA CATEGORY III). SEE COMMENT. COMMENT: THE SPECIMEN CONSISTS OF SMALL AND MEDIUM SIZED GROUPS OF FOLLICULAR EPITHELIAL CELLS WITH MILD CYTOLOGIC ATYPIA INCLUDING NUCLEAR ENLARGEMENT AND HURTHLE CELL CHANGE. SOME GROUPS ARE ARRANGED AS MICROFOLLICLES. THERE IS MINIMAL BACKGROUND COLLOID. BASED ON THESE FEATURES, A FOLLICULAR LESION/NEOPLASM CAN NOT BE ENTIRELY RULED OUT. A SPECIMEN WILL BE SENT FOR AFIRMA TESTING.    07/19/2017 Pathology Results    Lymph node, needle/core biopsy - METASTATIC PAPILLARY SEROUS CARCINOMA. - SEE COMMENT. Microscopic Comment Dr. Vicente Males has reviewed the case and concurs with this interpretation    10/15/2017 PET scan    No new or progressive disease. No evidence of abdominal or pelvic metastatic disease.  Stable hypermetabolic right thyroid lobe nodule and adjacent right supraclavicular lymph node.  Decreased size and hypermetabolic activity of solitary right paratracheal lymph node.    01/23/2018 PET scan    1. Overall, no significant change from PET-CT of 3 months ago. There is persistent hypermetabolic activity at the right thoracic inlet and within a right paratracheal mediastinal node. The nodes have not significantly changed in size, although the metabolic activity has minimally increased over this interval. It is uncertain how much of the thoracic inlet metabolic activity is attributed to the thyroid nodule versus adjacent cervical lymph nodes, both previously biopsied. 2. No new hypermetabolic activity within the neck, chest, abdomen or pelvis. No evidence of local recurrence in the  pelvis. 3. Stable probable radiation changes in the right lung.     CHIEF COMPLIANT: Follow-up of recently performed PET CT scan, currently on Avastin  INTERVAL HISTORY: Diane Lozano is a 62 year old with above-mentioned history of metastatic endometrial papillary serous carcinoma who is currently on Avastin therapy.  She is tolerating Avastin extremely well.  She had a PET CT scan recently and is here today to discuss the results.  The PET/CT reported that there is a slight progression of the supraclavicular lymph node as well as an additional lymph node in the right paratracheal area.  REVIEW OF SYSTEMS:   Constitutional: Denies fevers, chills or abnormal weight loss Eyes: Denies blurriness of vision Ears, nose, mouth, throat, and face: Denies mucositis or sore throat Respiratory: Denies cough, dyspnea or wheezes Cardiovascular: Denies palpitation, chest discomfort Gastrointestinal:  Denies nausea, heartburn or change in bowel habits Skin: Denies abnormal skin rashes Lymphatics: Denies new lymphadenopathy or easy bruising Neurological:Denies numbness, tingling or new weaknesses Behavioral/Psych: Mood is stable, no new changes  Extremities: No lower extremity edema  All other systems were reviewed with the patient and are negative.  I have reviewed the past medical history, past surgical history, social history and family history with the patient and they are unchanged from previous note.  ALLERGIES:  has No Known Allergies.  MEDICATIONS:  Current Outpatient Medications  Medication Sig Dispense Refill  . amLODipine (NORVASC) 10 MG tablet TAKE 1 TABLET BY MOUTH DAILY. DECREASE SIMVASTATIN TO 20MG DAILY WHILE ON AMLODIPINE PER MD 90 tablet  0  . Calcium Carb-Cholecalciferol 500-600 MG-UNIT TABS Take 1 tablet by mouth daily.     . Cholecalciferol (VITAMIN D3) 2000 units TABS Take 2,000 Units by mouth daily.     . hydrochlorothiazide (HYDRODIURIL) 25 MG tablet Take 1 tablet (25 mg  total) by mouth daily. 30 tablet 9  . LORazepam (ATIVAN) 0.5 MG tablet Take 0.5 mg by mouth 2 (two) times daily as needed.  0  . metoprolol tartrate (LOPRESSOR) 50 MG tablet TAKE 1 TABLET (50 MG TOTAL) BY MOUTH TWICE A DAY 60 tablet 1  . simvastatin (ZOCOR) 40 MG tablet Take 40 mg by mouth every evening.    . tacrolimus (PROTOPIC) 0.1 % ointment Apply 1 application topically daily as needed (rash).     . venlafaxine XR (EFFEXOR-XR) 75 MG 24 hr capsule Take 75 mg by mouth daily with breakfast.      No current facility-administered medications for this visit.     PHYSICAL EXAMINATION: ECOG PERFORMANCE STATUS: 1 - Symptomatic but completely ambulatory  Vitals:   04/18/18 1408  BP: (!) 151/78  Pulse: 92  Resp: 17  Temp: 98.4 F (36.9 C)  SpO2: 99%   Filed Weights   04/18/18 1408  Weight: 162 lb (73.5 kg)    GENERAL:alert, no distress and comfortable SKIN: skin color, texture, turgor are normal, no rashes or significant lesions EYES: normal, Conjunctiva are pink and non-injected, sclera clear OROPHARYNX:no exudate, no erythema and lips, buccal mucosa, and tongue normal  NECK: supple, thyroid normal size, non-tender, without nodularity LYMPH:  no palpable lymphadenopathy in the cervical, axillary or inguinal LUNGS: clear to auscultation and percussion with normal breathing effort HEART: regular rate & rhythm and no murmurs and no lower extremity edema ABDOMEN:abdomen soft, non-tender and normal bowel sounds MUSCULOSKELETAL:no cyanosis of digits and no clubbing  NEURO: alert & oriented x 3 with fluent speech, no focal motor/sensory deficits EXTREMITIES: No lower extremity edema   LABORATORY DATA:  I have reviewed the data as listed CMP Latest Ref Rng & Units 03/28/2018 03/07/2018 02/14/2018  Glucose 70 - 99 mg/dL 123(H) 94 81  BUN 8 - 23 mg/dL 19 16 21   Creatinine 0.44 - 1.00 mg/dL 0.85 0.77 0.76  Sodium 135 - 145 mmol/L 141 143 137  Potassium 3.5 - 5.1 mmol/L 4.1 4.0 4.2   Chloride 98 - 111 mmol/L 103 106 103  CO2 22 - 32 mmol/L 25 29 26   Calcium 8.9 - 10.3 mg/dL 9.6 9.9 9.8  Total Protein 6.5 - 8.1 g/dL 6.9 7.0 6.9  Total Bilirubin 0.3 - 1.2 mg/dL 0.4 0.4 0.5  Alkaline Phos 38 - 126 U/L 56 54 54  AST 15 - 41 U/L 20 24 24   ALT 0 - 44 U/L 17 22 20     Lab Results  Component Value Date   WBC 5.2 04/18/2018   HGB 12.0 04/18/2018   HCT 36.5 04/18/2018   MCV 96.8 04/18/2018   PLT 179 04/18/2018   NEUTROABS 3.9 04/18/2018    ASSESSMENT & PLAN:  Endometrial cancer ([place]) Metastatic endometrial cancer: Progressed on carbo Taxol, Doxil, Megace and tamoxifen, currently on bevacizumab  PET CT scan 04/16/2018: Interval increase in metabolic activity of the right supraclavicular and right lower paratracheal lymph nodes with minimal change in size.,  New hypermetabolic lymph node right lower paratracheal nodal station  Radiology review: I discussed with the patient that there appears to be a slight progression of her disease.   I will discuss her case with some of my  colleagues at [facility].  For the time being she will continue with Avastin therapy.  She will return back to see Dr. Alvy Bimler in 3 weeks to discuss further treatment options.    No orders of the defined types were placed in this encounter.  The patient has a good understanding of the overall plan. she agrees with it. she will call with any problems that may develop before the next visit here.   Harriette Ohara, MD 04/18/18

## 2018-04-18 NOTE — Patient Instructions (Signed)
[facility] Discharge Instructions for Patients Receiving Chemotherapy  Today you received the following chemotherapy agents: Bevacizumab (Avastin)  To help prevent nausea and vomiting after your treatment, we encourage you to take your nausea medication as directed.    If you develop nausea and vomiting that is not controlled by your nausea medication, call the clinic.   BELOW ARE SYMPTOMS THAT SHOULD BE REPORTED IMMEDIATELY:  *FEVER GREATER THAN 100.5 F  *CHILLS WITH OR WITHOUT FEVER  NAUSEA AND VOMITING THAT IS NOT CONTROLLED WITH YOUR NAUSEA MEDICATION  *UNUSUAL SHORTNESS OF BREATH  *UNUSUAL BRUISING OR BLEEDING  TENDERNESS IN MOUTH AND THROAT WITH OR WITHOUT PRESENCE OF ULCERS  *URINARY PROBLEMS  *BOWEL PROBLEMS  UNUSUAL RASH Items with * indicate a potential emergency and should be followed up as soon as possible.  Feel free to call the clinic should you have any questions or concerns. The clinic phone number is [phone].  Please show the CHEMO ALERT CARD at check-in to the Emergency Department and triage nurse.   

## 2018-04-18 NOTE — Assessment & Plan Note (Signed)
Metastatic endometrial cancer: Progressed on carbo Taxol, Doxil, Megace and tamoxifen, currently on bevacizumab  PET CT scan 04/16/2018: Interval increase in metabolic activity of the right supraclavicular and right lower paratracheal lymph nodes with minimal change in size.,  New hypermetabolic lymph node right lower paratracheal nodal station  Radiology review: I discussed with the patient that there appears to be a slight progression of her disease.  However there is no dramatic worsening of her malignancy.  Based on these findings, I recommended continuation of bevacizumab treatment.

## 2018-04-19 MED FILL — VENLAFAXINE HCL ER 150 MG C: 150 | 30 days supply | Qty: 60 | Fill #0

## 2018-04-22 ENCOUNTER — Telehealth: Payer: Self-pay | Admitting: Hematology and Oncology

## 2018-04-22 MED FILL — METOPROLOL TARTRATE 50 MG T: 50 | 30 days supply | Qty: 60 | Fill #1

## 2018-04-22 NOTE — Telephone Encounter (Signed)
I discussed the patient's case with GYN oncology at [facility] and the opinion was to keep her on the Avastin for now but at time of clear progression change to pembrolizumab in combination with Lenvatinib. I informed the patient of this discussion.

## 2018-04-25 DIAGNOSIS — F064 Anxiety disorder due to known physiological condition: Secondary | ICD-10-CM | POA: Diagnosis not present

## 2018-04-26 ENCOUNTER — Telehealth: Payer: Self-pay | Admitting: Hematology and Oncology

## 2018-04-26 NOTE — Telephone Encounter (Signed)
Called patient and verified appointments.  Printed and mailed calendar.

## 2018-05-06 ENCOUNTER — Other Ambulatory Visit: Payer: Self-pay | Admitting: Hematology and Oncology

## 2018-05-06 DIAGNOSIS — C541 Malignant neoplasm of endometrium: Secondary | ICD-10-CM

## 2018-05-06 DIAGNOSIS — C770 Secondary and unspecified malignant neoplasm of lymph nodes of head, face and neck: Secondary | ICD-10-CM

## 2018-05-10 ENCOUNTER — Other Ambulatory Visit: Payer: Self-pay | Admitting: Hematology and Oncology

## 2018-05-10 ENCOUNTER — Inpatient Hospital Stay: Payer: 59 | Attending: Hematology and Oncology

## 2018-05-10 ENCOUNTER — Encounter: Payer: Self-pay | Admitting: Hematology and Oncology

## 2018-05-10 ENCOUNTER — Inpatient Hospital Stay: Payer: 59

## 2018-05-10 ENCOUNTER — Inpatient Hospital Stay (HOSPITAL_BASED_OUTPATIENT_CLINIC_OR_DEPARTMENT_OTHER): Payer: 59 | Admitting: Hematology and Oncology

## 2018-05-10 DIAGNOSIS — C541 Malignant neoplasm of endometrium: Secondary | ICD-10-CM | POA: Insufficient documentation

## 2018-05-10 DIAGNOSIS — R809 Proteinuria, unspecified: Secondary | ICD-10-CM | POA: Diagnosis not present

## 2018-05-10 DIAGNOSIS — Z9221 Personal history of antineoplastic chemotherapy: Secondary | ICD-10-CM | POA: Diagnosis not present

## 2018-05-10 DIAGNOSIS — C770 Secondary and unspecified malignant neoplasm of lymph nodes of head, face and neck: Secondary | ICD-10-CM

## 2018-05-10 DIAGNOSIS — L309 Dermatitis, unspecified: Secondary | ICD-10-CM

## 2018-05-10 DIAGNOSIS — Z5112 Encounter for antineoplastic immunotherapy: Secondary | ICD-10-CM | POA: Insufficient documentation

## 2018-05-10 DIAGNOSIS — I10 Essential (primary) hypertension: Secondary | ICD-10-CM

## 2018-05-10 DIAGNOSIS — Z79899 Other long term (current) drug therapy: Secondary | ICD-10-CM | POA: Insufficient documentation

## 2018-05-10 DIAGNOSIS — Z923 Personal history of irradiation: Secondary | ICD-10-CM | POA: Insufficient documentation

## 2018-05-10 DIAGNOSIS — Z95828 Presence of other vascular implants and grafts: Secondary | ICD-10-CM

## 2018-05-10 DIAGNOSIS — Z7189 Other specified counseling: Secondary | ICD-10-CM

## 2018-05-10 LAB — COMPREHENSIVE METABOLIC PANEL WITH GFR
ALT: 18 U/L (ref 0–44)
AST: 23 U/L (ref 15–41)
Albumin: 3.7 g/dL (ref 3.5–5.0)
Alkaline Phosphatase: 56 U/L (ref 38–126)
Anion gap: 13 (ref 5–15)
BUN: 25 mg/dL — ABNORMAL HIGH (ref 8–23)
CO2: 23 mmol/L (ref 22–32)
Calcium: 9.8 mg/dL (ref 8.9–10.3)
Chloride: 103 mmol/L (ref 98–111)
Creatinine, Ser: 0.82 mg/dL (ref 0.44–1.00)
GFR calc Af Amer: >60 mL/min
GFR calc non Af Amer: >60 mL/min
Glucose, Bld: 85 mg/dL (ref 70–99)
Potassium: 4.3 mmol/L (ref 3.5–5.1)
Sodium: 139 mmol/L (ref 135–145)
Total Bilirubin: 0.5 mg/dL (ref 0.3–1.2)
Total Protein: 7.2 g/dL (ref 6.5–8.1)

## 2018-05-10 LAB — CBC WITH DIFFERENTIAL (CANCER CENTER ONLY)
Abs Immature Granulocytes: 0.01 10*3/uL (ref 0.00–0.07)
Basophils Absolute: 0 10*3/uL (ref 0.0–0.1)
Basophils Relative: 1 %
Eosinophils Absolute: 0.2 10*3/uL (ref 0.0–0.5)
Eosinophils Relative: 4 %
HCT: 37.6 % (ref 36.0–46.0)
Hemoglobin: 12.1 g/dL (ref 12.0–15.0)
Immature Granulocytes: 0 %
Lymphocytes Relative: 16 %
Lymphs Abs: 0.8 10*3/uL (ref 0.7–4.0)
MCH: 31.2 pg (ref 26.0–34.0)
MCHC: 32.2 g/dL (ref 30.0–36.0)
MCV: 96.9 fL (ref 80.0–100.0)
Monocytes Absolute: 0.5 10*3/uL (ref 0.1–1.0)
Monocytes Relative: 10 %
Neutro Abs: 3.3 10*3/uL (ref 1.7–7.7)
Neutrophils Relative %: 69 %
Platelet Count: 183 10*3/uL (ref 150–400)
RBC: 3.88 MIL/uL (ref 3.87–5.11)
RDW: 12.8 % (ref 11.5–15.5)
WBC Count: 4.7 10*3/uL (ref 4.0–10.5)
nRBC: 0 % (ref 0.0–0.2)

## 2018-05-10 LAB — TOTAL PROTEIN, URINE DIPSTICK: Protein, ur: 30 mg/dL — AB

## 2018-05-10 MED ORDER — SODIUM CHLORIDE 0.9 % IV SOLN
14.8000 mg/kg | Freq: Once | INTRAVENOUS | Status: AC
  Administered 2018-05-10: 1100 mg via INTRAVENOUS
  Filled 2018-05-10: qty 32

## 2018-05-10 MED ORDER — SODIUM CHLORIDE 0.9% FLUSH
10.0000 mL | Freq: Once | INTRAVENOUS | Status: AC
  Administered 2018-05-10: 10 mL
  Filled 2018-05-10: qty 10

## 2018-05-10 MED ORDER — SODIUM CHLORIDE 0.9% FLUSH
10.0000 mL | INTRAVENOUS | Status: DC | PRN
  Administered 2018-05-10: 10 mL
  Filled 2018-05-10: qty 10

## 2018-05-10 MED ORDER — HEPARIN SOD (PORK) LOCK FLUSH 100 UNIT/ML IV SOLN
500.0000 [IU] | Freq: Once | INTRAVENOUS | Status: AC | PRN
  Administered 2018-05-10: 500 [IU]
  Filled 2018-05-10: qty 5

## 2018-05-10 MED ORDER — SODIUM CHLORIDE 0.9 % IV SOLN
Freq: Once | INTRAVENOUS | Status: AC
  Administered 2018-05-10: 14:00:00 via INTRAVENOUS
  Filled 2018-05-10: qty 250

## 2018-05-10 NOTE — Patient Instructions (Signed)
[facility] Discharge Instructions for Patients Receiving Chemotherapy  Today you received the following chemotherapy agents: Bevacizumab (Avastin)  To help prevent nausea and vomiting after your treatment, we encourage you to take your nausea medication as directed.    If you develop nausea and vomiting that is not controlled by your nausea medication, call the clinic.   BELOW ARE SYMPTOMS THAT SHOULD BE REPORTED IMMEDIATELY:  *FEVER GREATER THAN 100.5 F  *CHILLS WITH OR WITHOUT FEVER  NAUSEA AND VOMITING THAT IS NOT CONTROLLED WITH YOUR NAUSEA MEDICATION  *UNUSUAL SHORTNESS OF BREATH  *UNUSUAL BRUISING OR BLEEDING  TENDERNESS IN MOUTH AND THROAT WITH OR WITHOUT PRESENCE OF ULCERS  *URINARY PROBLEMS  *BOWEL PROBLEMS  UNUSUAL RASH Items with * indicate a potential emergency and should be followed up as soon as possible.  Feel free to call the clinic should you have any questions or concerns. The clinic phone number is [phone].  Please show the CHEMO ALERT CARD at check-in to the Emergency Department and triage nurse.   

## 2018-05-10 NOTE — Progress Notes (Signed)
[place] OFFICE PROGRESS NOTE  Patient Care Team: [name], MD as PCP - General (Family Medicine) Reynold Bowen, MD as Consulting Physician (Endocrinology)  ASSESSMENT & PLAN:  Endometrial cancer [facility]) I have reviewed her imaging study extensively She has minimum change on her PET CT scan even though the radiologist felt her disease has progressed We discussed various options She is not keen to repeat biopsy I think it is reasonable to continue single agent bevacizumab and plan to recheck imaging study again in 3 months She agreed with the plan of care  Metastasis to supraclavicular lymph node [facility]) This is nonpalpable on exam.  We will monitor with serial imaging study Her next PET CT scan will be due around end of February 2020  Dermatitis She has dry skin.  I recommend topical emollient cream  Essential hypertension Her blood pressure is stable but she has mild proteinuria We discussed the importance of regular blood pressure monitoring   No orders of the defined types were placed in this encounter.   INTERVAL HISTORY: Please see below for problem oriented charting. She returns for further treatment and follow-up She feels well No recent headache No recent infection, fever or chills Her skin dermatitis is stable  SUMMARY OF ONCOLOGIC HISTORY: Oncology History   [employer] testing done 11-2015 on surgical path from 2014: MS stable TMB low 4 muts/mb ATM [id number] ERBB3 T389K E2H2 rearrangement exon 9 PPP2R1A P179R TP53 I195N    ER- APPROXIMATELY 25-35% STAINING IN NEOPLASTIC CELLS (INTERMEDIATE)  PR- APPROXIMATELY 25-35% STAINING IN NEOPLASTIC CELLS (STRONG)  Repeat biopsy 04/02/17: ER negative, Her 2 negative  Progressed on Doxil     Endometrial cancer ([place])   06/20/2012 Pathology Results    Biopsy positive for papillary serous carcinoma    06/20/2012 Genetic Testing    [employer] testing done 11-2015 on  surgical path from 2014: MS stable TMB low 4 muts/mb ATM [id number] ERBB3 T389K E2H2 rearrangement exon 9 PPP2R1A P179R TP53 I195N     06/20/2012 Initial Diagnosis    Patient presented to PCP with intermittent vaginal bleeding since ~ Oct 2013, endometrial biopsy 06-20-12 with complex endometrial hyperplasia with atypia    06/26/2012 Imaging    Thickened endometrial lining in a postmenopausal patient experiencing vaginal bleeding. In the setting of post-menopausal bleeding, endometrial sampling is indicated to exclude carcinoma. No focal myometrial abnormalities are seen.  Normal left ovary and non-visualized right ovary    07/30/2012 Surgery    Dr. Polly Cobia performed robotic hysterectomy with bilateral salpingo-oophorectomy, bilateral pelvic lymph node dissection and periaortic lymph node dissection. Intraoperatively on frozen section, the patient was noted to have a large endometrial polyp with changes within the polyp consistent for high-grade malignancy, possibly papillary serous carcinoma. There is no obvious extrauterine disease noted.      07/30/2012 Pathology Results    [phone] SUPPLEMENTAL REPORT  THE ENDOMETRIAL CARCINOMA WAS ANALYZED FOR DNA MISMATCH REPAIR PROTEINS.IMMUNOHISTOCHEMICALLY, THE NEOPLASM RETAINED NUCLEAR EXPRESSION OF 4 GENE PRODUCTS, MLH1, MSH2, MSH6, AND PMS2, INVOLVED IN DNA MISMATCH REPAIR.POSITIVE AND NEGATIVE CONTROLS WORKED APPROPRIATELY.  PER REQUEST, AN ER AND PR ARE PERFORMED ON BLOCK 1I.  ER- APPROXIMATELY 25-35% STAINING IN NEOPLASTIC CELLS (INTERMEDIATE)  PR- APPROXIMATELY 25-35% STAINING IN NEOPLASTIC CELLS (STRONG)  ER AND PR PREDOMINANTLY SHOW STAINING IN THE SEROUS COMPONENT.  DIAGNOSIS  1. UTERUS, CERVIX WITH BILATERAL FALLOPIAN TUBES AND OVARIES,  HYSTERECTOMY AND BILATERAL SALPINGO-OOPHORECTOMY:  HIGH GRADE/POORLY DIFFERENTIATED ENDOMETRIAL ADENOCARCINOMA WITH SOLID AND SEROUS PAPILLARY COMPONENTS.  HISTOPATHOLOGIC  TYPE:A VARIETY OF PATTERNS WERE PRESENT, ALL OF WHICH SHOULD BE CONSIDERED TO BE HIGH GRADE.SEROUS PAPILLARY CARCINOMA WAS SEEN OCCURRING ADJACENT TO SOLID ENDOMETRIAL CARCINOMA WITH MARKED ANAPLASIA AND GIANT CELLS. SIZE:TUMOR MEASURED AT LEAST 4.8 CM IN GREATEST HORIZONTAL DIMENSION.  GRADE: POORLY DIFFERENTIATED OR HIGH GRADE. DEPTH OF INVASION: NO DEFINITE MYOMETRIAL INVOLVEMENT WAS SEEN.THE TUMOR APPEARED CONFINED TO THE POLYP AS WELL AS SURFACE ENDOMETRIUM. WHERE MYOMETRIAL THICKNESS NOT APPLICABLE. SEROSAL INVOLVEMENT: NOT DEMONSTRATED.  ENDOCERVICAL INVOLVEMENT:NOT DEMONSTRATED. RESECTION MARGINS: FREE OF INVOLVEMENT. EXTRAUTERINE EXTENSION:NOT DEMONSTRATED. ANGIOLYMPHATIC INVASION: NOT DEFINITELY SEEN. TOTAL NODES EXAMINED:22.  PELVIC NODES EXAMINED:20.  PELVIC NODES INVOLVED:0.  PARA-AORTIC NODES 2. EXAMINED:  PARA-AORTIC NODES 0. INVOLVED TNM STAGE: T1A N0 MX. AJCC STAGE GROUPING: IA. FIGO STAGE:IA.    08/26/2012 Imaging    No CT evidence for intra-abdominal or pelvic metastatic disease. Trace free pelvic fluid, presumably postoperative although the date of surgery is not documented in the electronic medical record.    08/26/2012 - 12/13/2012 Chemotherapy    She received 6 cycles of carbo/taxol     10/29/2012 - 11/27/2012 Radiation Therapy    May 27, June 5,  June 11, June 19, November 27, 2012: Proximal vagina 30 Gy in 5 fractions      01/17/2013 Imaging    1.  No evidence of recurrent or metastatic disease. 2.  No acute abnormality involving the abdomen or pelvis. 3.  Mild diffuse hepatic steatosis. 4.  Very small supraumbilical midline anterior abdominal wall hernia containing fat, unchanged    01/22/2014 Imaging    No evidence for metastatic or recurrent disease. 2. No bowel obstruction.  Normal appendix. 3. Small fat containing hernia, stable in appearance. 4. Status post  hysterectomy and bilateral oophorectomy    02/19/2014 Imaging    No pulmonary lesions are identified. The abnormality on the chest x-ray is due to asymmetric left-sided sternoclavicular joint degenerative disease    10/12/2014 Imaging    New mild retroperitoneal lymphadenopathy in the left paraaortic region and proximal left common iliac chain, consistent with metastatic disease. No other sites of metastatic disease identified within the abdomen or pelvis.      11/27/2014 - 02/11/2015 Chemotherapy    She received 4 cycles of carbo/taxol     03/01/2015 PET scan    Single hypermetabolic small retroperitoneal lymph node along the aorta. 2. No evidence of metastatic disease otherwise in the abdomen or pelvis. No evidence local recurrence. 3. Intensely hypermetabolic enlarged nodule adjacent to the RIGHT lobe of thyroid gland. This presumably represents the biopsied lesion in clinician report which was found to be benign thyroid tissue.    04/05/2015 - 05/13/2015 Radiation Therapy    She received 50.4 gray in 28 fractions with simultaneous integrated boost to 56 gray    06/17/2015 Imaging    No acute process or evidence of metastatic disease in the abdomen or pelvis. Resolution of previously described retroperitoneal adenopathy. 2.  Possible constipation. 3. Atherosclerosis.    06/17/2015 Tumor Marker    Patient's tumor was tested for the following markers: CA125 Results of the tumor marker test revealed 33    08/12/2015 Tumor Marker    Patient's tumor was tested for the following markers: CA125 Results of the tumor marker test revealed 52    08/31/2015 PET scan    Development of right paratracheal hypermetabolic adenopathy, consistent with nodal metastasis. 2. The previously described isolated abdominal retroperitoneal hypermetabolic node has resolved. 3. Persistent hypermetabolic right thyroid nodule, per report previously biopsied. Correlate with those results.  4.  Possible constipation.     09/09/2015 -  Anti-estrogen oral therapy    She has been receiving alternative treatment between megace and tamoxifen    09/09/2015 Tumor Marker    Patient's tumor was tested for the following markers: CA125 Results of the tumor marker test revealed 37.8    09/20/2015 Procedure    Technically successful ultrasound-guided thyroid aspiration biopsy , dominant right nodule    09/20/2015 Pathology Results    THYROID, RIGHT, FINE NEEDLE ASPIRATION (SPECIMEN 1 OF 1 COLLECTED 09/20/15): FINDINGS CONSISTENT WITH BENIGN THYROID NODULE (BETHESDA CATEGORY II).    10/28/2015 Tumor Marker    Patient's tumor was tested for the following markers: CA125 Results of the tumor marker test revealed 53.2    11/04/2015 Imaging    Stable benign right thyroid nodule    12/16/2015 Tumor Marker    Patient's tumor was tested for the following markers: CA125 Results of the tumor marker test revealed 38.1    03/22/2016 PET scan    Interval progression of hypermetabolic right paratracheal lymph node consistent with metastatic involvement. 2. Stable hypermetabolic right thyroid nodule. Reportedly this has been biopsied in the past.    03/23/2016 Tumor Marker    Patient's tumor was tested for the following markers: CA125 Results of the tumor marker test revealed 62.4    04/13/2016 - 06/01/2016 Radiation Therapy    She received 56 Gy to the chest in 28 fractions     05/09/2016 Imaging    No evidence of left lower extremity deep vein thrombosis. No evidence of a superficial thrombosis of the greater and lesser saphenous veins. Positive for thrombus noted in several varicosities of the calf. No evidence of Baker's cyst on the left.    05/17/2016 Tumor Marker    Patient's tumor was tested for the following markers: CA125 Results of the tumor marker test revealed 9    06/29/2016 Tumor Marker    Patient's tumor was tested for the following markers: CA125 Results of the tumor marker test revealed 5.9    08/04/2016 Tumor  Marker    Patient's tumor was tested for the following markers: CA125 Results of the tumor marker test revealed 6.0    09/12/2016 PET scan    Complete metabolic response to therapy, with resolution of hypermetabolic mediastinal lymphadenopathy since prior exam. No residual or new metastatic disease identified. Stable hypermetabolic right thyroid lobe nodule, which was previously biopsied on 09/20/2015.    03/13/2017 PET scan    1. Solitary focus of recurrent right paratracheal hypermetabolic activity, with a 1.0 cm right lower paratracheal node having a maximum SUV of 9.0. Appearance compatible with recurrent malignancy. 2. Continued hypermetabolic right thyroid nodule, previously biopsied, presumed benign -correlate with prior biopsy results. 3. Other imaging findings of potential clinical significance: Aortic Atherosclerosis (ICD10-I70.0). Mild cardiomegaly. Prominent stool throughout the colon favors constipation.    04/02/2017 Pathology Results    FINE NEEDLE ASPIRATION, ENDOSCOPIC, (EBUS) 4 R NODE (SPECIMEN 1 OF 2 COLLECTED 04/02/17): MALIGNANT CELLS PRESENT, CONSISTENT WITH CARCINOMA. SEE COMMENT. COMMENT: THE MALIGNANT CELLS ARE POSITIVE FOR P53 AND NEGATIVE FOR ESTROGEN RECEPTOR AND TTF-1. THIS PROFILE IS NON-SPECIFIC, BUT THE P53 POSITIVE STAINING IS SUGGESTIVE OF GYNECOLOGIC PRIMARY.     04/11/2017 Procedure    Successful 8 French right internal jugular vein power port placement with its tip at the SVC/RA junction    04/12/2017 Imaging    Normal LV size with mild LV hypertrophy. EF 55-60%. Normal RV size and systolic function. Aortic valve  sclerosis without significant stenosis.    04/18/2017 - 06/14/2017 Chemotherapy    The patient had chemotherapy with Doxil     07/10/2017 Imaging    - Left ventricle: The cavity size was normal. There was mild concentric hypertrophy. Systolic function was normal. The estimated ejection fraction was in the range of 55% to 60%. Wall motion was  normal; there were no regional wall motion abnormalities. Doppler parameters are consistent with abnormal left ventricular relaxation (grade 1 diastolic dysfunction). - Aortic valve: Noncoronary cusp mobility was mildly restricted. - Mitral valve: There was mild regurgitation. - Left atrium: The atrium was mildly dilated. - Atrial septum: No defect or patent foramen ovale was identified.  Impressions:  - Compared to November 2018, global LV longitudinal strain remains normal (has increased)    07/11/2017 PET scan    Increased size and hypermetabolic activity of 3.3 cm right thyroid lobe nodule. Thyroid carcinoma cannot be excluded. Recommend repeat ultrasound guided fine needle aspiration to exclude thyroid carcinoma.  New adjacent hypermetabolic 10 mm right supraclavicular lymph node, suspicious for lymph node metastasis.  Slight increase in size and hypermetabolic activity of solitary right paratracheal lymph node.  No evidence of abdominal or pelvic metastatic disease.    07/18/2017 Procedure    1. Technically successful ultrasound guided fine needle aspiration of indeterminate hypermetabolic right-sided thyroid nodule/mass. 2. Technically successful ultrasound-guided core needle biopsy of hypermetabolic right lower cervical lymph node.    07/18/2017 Pathology Results    THYROID, FINE NEEDLE ASPIRATION RIGHT (SPECIMEN 1 OF 1, COLLECTED ON 07/18/2017): ATYPIA OF UNDETERMINED SIGNIFICANCE OR FOLLICULAR LESION OF UNDETERMINED SIGNIFICANCE (BETHESDA CATEGORY III). SEE COMMENT. COMMENT: THE SPECIMEN CONSISTS OF SMALL AND MEDIUM SIZED GROUPS OF FOLLICULAR EPITHELIAL CELLS WITH MILD CYTOLOGIC ATYPIA INCLUDING NUCLEAR ENLARGEMENT AND HURTHLE CELL CHANGE. SOME GROUPS ARE ARRANGED AS MICROFOLLICLES. THERE IS MINIMAL BACKGROUND COLLOID. BASED ON THESE FEATURES, A FOLLICULAR LESION/NEOPLASM CAN NOT BE ENTIRELY RULED OUT. A SPECIMEN WILL BE SENT FOR AFIRMA TESTING.    07/19/2017 Pathology Results     Lymph node, needle/core biopsy - METASTATIC PAPILLARY SEROUS CARCINOMA. - SEE COMMENT. Microscopic Comment Dr. Vicente Males has reviewed the case and concurs with this interpretation    10/15/2017 PET scan    No new or progressive disease. No evidence of abdominal or pelvic metastatic disease.  Stable hypermetabolic right thyroid lobe nodule and adjacent right supraclavicular lymph node.  Decreased size and hypermetabolic activity of solitary right paratracheal lymph node.    01/23/2018 PET scan    1. Overall, no significant change from PET-CT of 3 months ago. There is persistent hypermetabolic activity at the right thoracic inlet and within a right paratracheal mediastinal node. The nodes have not significantly changed in size, although the metabolic activity has minimally increased over this interval. It is uncertain how much of the thoracic inlet metabolic activity is attributed to the thyroid nodule versus adjacent cervical lymph nodes, both previously biopsied. 2. No new hypermetabolic activity within the neck, chest, abdomen or pelvis. No evidence of local recurrence in the pelvis. 3. Stable probable radiation changes in the right lung.    04/16/2018 PET scan    1. Interval increase in metabolic activity of the RIGHT supraclavicular node and RIGHT lower paratracheal mediastinal node with minimal change in size. Findings consistent with persistent and mildly progressed metastatic adenopathy. 2. New hypermetabolic small lymph node in the RIGHT lower paratracheal nodal station adjacent to the previously followed node. 3. No evidence of local recurrence in the pelvis or new  disease elsewhere.     REVIEW OF SYSTEMS:   Constitutional: Denies fevers, chills or abnormal weight loss Eyes: Denies blurriness of vision Ears, nose, mouth, throat, and face: Denies mucositis or sore throat Respiratory: Denies cough, dyspnea or wheezes Cardiovascular: Denies palpitation, chest discomfort or lower  extremity swelling Gastrointestinal:  Denies nausea, heartburn or change in bowel habits Skin: Denies abnormal skin rashes Lymphatics: Denies new lymphadenopathy or easy bruising Neurological:Denies numbness, tingling or new weaknesses Behavioral/Psych: Mood is stable, no new changes  All other systems were reviewed with the patient and are negative.  I have reviewed the past medical history, past surgical history, social history and family history with the patient and they are unchanged from previous note.  ALLERGIES:  has No Known Allergies.  MEDICATIONS:  Current Outpatient Medications  Medication Sig Dispense Refill  . amLODipine (NORVASC) 10 MG tablet TAKE 1 TABLET BY MOUTH DAILY. DECREASE SIMVASTATIN TO 20MG DAILY WHILE ON AMLODIPINE PER MD 90 tablet 0  . Calcium Carb-Cholecalciferol 500-600 MG-UNIT TABS Take 1 tablet by mouth daily.     . Cholecalciferol (VITAMIN D3) 2000 units TABS Take 2,000 Units by mouth daily.     . hydrochlorothiazide (HYDRODIURIL) 25 MG tablet Take 1 tablet (25 mg total) by mouth daily. 30 tablet 9  . LORazepam (ATIVAN) 0.5 MG tablet Take 0.5 mg by mouth 2 (two) times daily as needed.  0  . metoprolol tartrate (LOPRESSOR) 50 MG tablet TAKE 1 TABLET (50 MG TOTAL) BY MOUTH TWICE A DAY 60 tablet 1  . simvastatin (ZOCOR) 40 MG tablet Take 40 mg by mouth every evening.    . tacrolimus (PROTOPIC) 0.1 % ointment Apply 1 application topically daily as needed (rash).     . venlafaxine XR (EFFEXOR-XR) 75 MG 24 hr capsule Take 75 mg by mouth daily with breakfast.      No current facility-administered medications for this visit.    Facility-Administered Medications Ordered in Other Visits  Medication Dose Route Frequency Provider Last Rate Last Dose  . sodium chloride flush (NS) 0.9 % injection 10 mL  10 mL Intracatheter PRN Alvy Bimler, Cherril Hett, MD   10 mL at 05/10/18 1456    PHYSICAL EXAMINATION: ECOG PERFORMANCE STATUS: 0 - Asymptomatic  Vitals:   05/10/18 1300  BP:  135/72  Pulse: 75  Resp: 18  Temp: 98.3 F (36.8 C)  SpO2: 100%   Filed Weights   05/10/18 1300  Weight: 159 lb (72.1 kg)    GENERAL:alert, no distress and comfortable SKIN: skin color, texture, turgor are normal, no rashes or significant lesions.  Noted chronic dermatitis changes EYES: normal, Conjunctiva are pink and non-injected, sclera clear OROPHARYNX:no exudate, no erythema and lips, buccal mucosa, and tongue normal  NECK: supple, thyroid normal size, non-tender, without nodularity LYMPH:  no palpable lymphadenopathy in the cervical, axillary or inguinal LUNGS: clear to auscultation and percussion with normal breathing effort HEART: regular rate & rhythm and no murmurs and no lower extremity edema ABDOMEN:abdomen soft, non-tender and normal bowel sounds Musculoskeletal:no cyanosis of digits and no clubbing  NEURO: alert & oriented x 3 with fluent speech, no focal motor/sensory deficits  LABORATORY DATA:  I have reviewed the data as listed    Component Value Date/Time   NA 139 05/10/2018 1212   NA 141 05/17/2017 0957   K 4.3 05/10/2018 1212   K 3.6 05/17/2017 0957   CL 103 05/10/2018 1212   CL 105 11/19/2012 0848   CO2 23 05/10/2018 1212   CO2 25  05/17/2017 0957   GLUCOSE 85 05/10/2018 1212   GLUCOSE 85 05/17/2017 0957   GLUCOSE 122 (H) 11/19/2012 0848   BUN 25 (H) 05/10/2018 1212   BUN 15.2 05/17/2017 0957   CREATININE 0.82 05/10/2018 1212   CREATININE 0.8 05/17/2017 0957   CALCIUM 9.8 05/10/2018 1212   CALCIUM 9.6 05/17/2017 0957   PROT 7.2 05/10/2018 1212   PROT 7.0 05/17/2017 0957   ALBUMIN 3.7 05/10/2018 1212   ALBUMIN 3.9 05/17/2017 0957   AST 23 05/10/2018 1212   AST 25 05/17/2017 0957   ALT 18 05/10/2018 1212   ALT 18 05/17/2017 0957   ALKPHOS 56 05/10/2018 1212   ALKPHOS 57 05/17/2017 0957   BILITOT 0.5 05/10/2018 1212   BILITOT 0.47 05/17/2017 0957   GFRNONAA >60 05/10/2018 1212   GFRAA >60 05/10/2018 1212    No results found for: SPEP,  UPEP  Lab Results  Component Value Date   WBC 4.7 05/10/2018   NEUTROABS 3.3 05/10/2018   HGB 12.1 05/10/2018   HCT 37.6 05/10/2018   MCV 96.9 05/10/2018   PLT 183 05/10/2018      Chemistry      Component Value Date/Time   NA 139 05/10/2018 1212   NA 141 05/17/2017 0957   K 4.3 05/10/2018 1212   K 3.6 05/17/2017 0957   CL 103 05/10/2018 1212   CL 105 11/19/2012 0848   CO2 23 05/10/2018 1212   CO2 25 05/17/2017 0957   BUN 25 (H) 05/10/2018 1212   BUN 15.2 05/17/2017 0957   CREATININE 0.82 05/10/2018 1212   CREATININE 0.8 05/17/2017 0957   GLU 158 (H) 01/14/2015 1035      Component Value Date/Time   CALCIUM 9.8 05/10/2018 1212   CALCIUM 9.6 05/17/2017 0957   ALKPHOS 56 05/10/2018 1212   ALKPHOS 57 05/17/2017 0957   AST 23 05/10/2018 1212   AST 25 05/17/2017 0957   ALT 18 05/10/2018 1212   ALT 18 05/17/2017 0957   BILITOT 0.5 05/10/2018 1212   BILITOT 0.47 05/17/2017 0957       RADIOGRAPHIC STUDIES: I have reviewed imaging study with the patient I have personally reviewed the radiological images as listed and agreed with the findings in the report. Nm Pet Image Restag (ps) Skull Base To Thigh  Result Date: 04/16/2018 CLINICAL DATA: SUBSEQUENT: CLINICAL DATA: SUBSEQUENT Subsequent treatment strategy for endometrial carcinoma with high-grade features. Diagnosed 2014. EXAM: NUCLEAR MEDICINE PET SKULL BASE TO THIGH TECHNIQUE: 8.1 mCi F-18 FDG was injected intravenously. Full-ring PET imaging was performed from the skull base to thigh after the radiotracer. CT data was obtained and used for attenuation correction and anatomic localization. Fasting blood glucose: 81 mg/dl COMPARISON:  PET-CT 01/23/2018, 10/15/2017 FINDINGS: Mediastinal blood pool activity: SUV max 1.4 NECK: Increase in metabolic activity of small RIGHT supraclavicular node with SUV max equal 7.3 increased from 4.7. Node measures approximately 8 mm (image 52/4) (metastatic papillary serous endometrial carcinoma  on biopsy).This node is immediately adjacent to a hypermetabolic thyroid nodule (atypia on biopsy). Incidental CT findings: none CHEST: Hypermetabolic RIGHT lower paratracheal lymph node is also increased in metabolic activity SUV max equal 11.1 increased from 7.9. This node is also small measuring only 11 mm (image 65/4) which compares to 9 mm on prior. No new adenopathy in the mediastinum. Potentially a second hypermetabolic nodule in the RIGHT lower paratracheal nodal station with SUV max equal 6.7 is not clearly seen on prior. No hypermetabolic pulmonary nodules. No suspicious lesion on CT exam  Incidental CT findings: Port in the anterior chest wall with tip in distal SVC. ABDOMEN/PELVIS: No abnormal hypermetabolic activity within the liver, pancreas, adrenal glands, or spleen. No hypermetabolic lymph nodes in the abdomen or pelvis. No hypermetabolic nodularity in the pelvis. Incidental CT findings: Post hysterectomy SKELETON: No focal hypermetabolic activity to suggest skeletal metastasis. Incidental CT findings: none IMPRESSION: 1. Interval increase in metabolic activity of the RIGHT supraclavicular node and RIGHT lower paratracheal mediastinal node with minimal change in size. Findings consistent with persistent and mildly progressed metastatic adenopathy. 2. New hypermetabolic small lymph node in the RIGHT lower paratracheal nodal station adjacent to the previously followed node. 3. No evidence of local recurrence in the pelvis or new disease elsewhere. Electronically Signed   By: Suzy Bouchard M.D.   On: 04/16/2018 11:01    All questions were answered. The patient knows to call the clinic with any problems, questions or concerns. No barriers to learning was detected.  I spent 15 minutes counseling the patient face to face. The total time spent in the appointment was 20 minutes and more than 50% was on counseling and review of test results  [name], MD 05/10/2018 2:59 PM

## 2018-05-10 NOTE — Assessment & Plan Note (Signed)
Her blood pressure is stable but she has mild proteinuria We discussed the importance of regular blood pressure monitoring

## 2018-05-10 NOTE — Assessment & Plan Note (Signed)
This is nonpalpable on exam.  We will monitor with serial imaging study Her next PET CT scan will be due around end of February 2020

## 2018-05-10 NOTE — Patient Instructions (Signed)
Implanted Port Home Guide An implanted port is a type of central line that is placed under the skin. Central lines are used to provide IV access when treatment or nutrition needs to be given through a person's veins. Implanted ports are used for long-term IV access. An implanted port may be placed because:  You need IV medicine that would be irritating to the small veins in your hands or arms.  You need long-term IV medicines, such as antibiotics.  You need IV nutrition for a long period.  You need frequent blood draws for lab tests.  You need dialysis.  Implanted ports are usually placed in the chest area, but they can also be placed in the upper arm, the abdomen, or the leg. An implanted port has two main parts:  Reservoir. The reservoir is round and will appear as a small, raised area under your skin. The reservoir is the part where a needle is inserted to give medicines or draw blood.  Catheter. The catheter is a thin, flexible tube that extends from the reservoir. The catheter is placed into a large vein. Medicine that is inserted into the reservoir goes into the catheter and then into the vein.  How will I care for my incision site? Do not get the incision site wet. Bathe or shower as directed by your health care provider. How is my port accessed? Special steps must be taken to access the port:  Before the port is accessed, a numbing cream can be placed on the skin. This helps numb the skin over the port site.  Your health care provider uses a sterile technique to access the port. ? Your health care provider must put on a mask and sterile gloves. ? The skin over your port is cleaned carefully with an antiseptic and allowed to dry. ? The port is gently pinched between sterile gloves, and a needle is inserted into the port.  Only "non-coring" port needles should be used to access the port. Once the port is accessed, a blood return should be checked. This helps ensure that the port  is in the vein and is not clogged.  If your port needs to remain accessed for a constant infusion, a clear (transparent) bandage will be placed over the needle site. The bandage and needle will need to be changed every week, or as directed by your health care provider.  Keep the bandage covering the needle clean and dry. Do not get it wet. Follow your health care provider's instructions on how to take a shower or bath while the port is accessed.  If your port does not need to stay accessed, no bandage is needed over the port.  What is flushing? Flushing helps keep the port from getting clogged. Follow your health care provider's instructions on how and when to flush the port. Ports are usually flushed with saline solution or a medicine called heparin. The need for flushing will depend on how the port is used.  If the port is used for intermittent medicines or blood draws, the port will need to be flushed: ? After medicines have been given. ? After blood has been drawn. ? As part of routine maintenance.  If a constant infusion is running, the port may not need to be flushed.  How long will my port stay implanted? The port can stay in for as long as your health care provider thinks it is needed. When it is time for the port to come out, surgery will be   done to remove it. The procedure is similar to the one performed when the port was put in. When should I seek immediate medical care? When you have an implanted port, you should seek immediate medical care if:  You notice a bad smell coming from the incision site.  You have swelling, redness, or drainage at the incision site.  You have more swelling or pain at the port site or the surrounding area.  You have a fever that is not controlled with medicine.  This information is not intended to replace advice given to you by your health care provider. Make sure you discuss any questions you have with your health care provider. Document  Released: 05/22/2005 Document Revised: 10/28/2015 Document Reviewed: 01/27/2013 [employer] Interactive Patient Education  2017 [employer]  

## 2018-05-10 NOTE — Assessment & Plan Note (Signed)
She has dry skin.  I recommend topical emollient cream

## 2018-05-10 NOTE — Assessment & Plan Note (Signed)
I have reviewed her imaging study extensively She has minimum change on her PET CT scan even though the radiologist felt her disease has progressed We discussed various options She is not keen to repeat biopsy I think it is reasonable to continue single agent bevacizumab and plan to recheck imaging study again in 3 months She agreed with the plan of care

## 2018-05-13 ENCOUNTER — Telehealth: Payer: Self-pay | Admitting: Hematology and Oncology

## 2018-05-13 MED FILL — busPIRone HCL 10 MG TABS: 10 | 30 days supply | Qty: 60 | Fill #0

## 2018-05-13 NOTE — Telephone Encounter (Signed)
Per 12/5 los, return for no new orders.

## 2018-05-16 ENCOUNTER — Telehealth: Payer: Self-pay

## 2018-05-16 NOTE — Telephone Encounter (Signed)
Faxed form back and 05/10/18 office visit to [facility].

## 2018-05-20 ENCOUNTER — Other Ambulatory Visit: Payer: Self-pay | Admitting: Hematology and Oncology

## 2018-05-20 MED FILL — METOPROLOL TARTRATE 50 MG T: 50 | 30 days supply | Qty: 60 | Fill #0

## 2018-05-20 MED FILL — VENLAFAXINE HCL ER 150 MG C: 150 | 30 days supply | Qty: 60 | Fill #1

## 2018-05-31 ENCOUNTER — Telehealth: Payer: Self-pay | Admitting: Hematology and Oncology

## 2018-05-31 ENCOUNTER — Inpatient Hospital Stay: Payer: 59

## 2018-05-31 ENCOUNTER — Encounter: Payer: Self-pay | Admitting: Hematology and Oncology

## 2018-05-31 ENCOUNTER — Inpatient Hospital Stay (HOSPITAL_BASED_OUTPATIENT_CLINIC_OR_DEPARTMENT_OTHER): Payer: 59 | Admitting: Hematology and Oncology

## 2018-05-31 DIAGNOSIS — C541 Malignant neoplasm of endometrium: Secondary | ICD-10-CM | POA: Diagnosis not present

## 2018-05-31 DIAGNOSIS — I10 Essential (primary) hypertension: Secondary | ICD-10-CM | POA: Diagnosis not present

## 2018-05-31 DIAGNOSIS — Z7189 Other specified counseling: Secondary | ICD-10-CM

## 2018-05-31 DIAGNOSIS — Z79899 Other long term (current) drug therapy: Secondary | ICD-10-CM | POA: Diagnosis not present

## 2018-05-31 DIAGNOSIS — C770 Secondary and unspecified malignant neoplasm of lymph nodes of head, face and neck: Secondary | ICD-10-CM

## 2018-05-31 DIAGNOSIS — Z95828 Presence of other vascular implants and grafts: Secondary | ICD-10-CM

## 2018-05-31 DIAGNOSIS — Z9221 Personal history of antineoplastic chemotherapy: Secondary | ICD-10-CM | POA: Diagnosis not present

## 2018-05-31 DIAGNOSIS — R809 Proteinuria, unspecified: Secondary | ICD-10-CM | POA: Diagnosis not present

## 2018-05-31 DIAGNOSIS — L309 Dermatitis, unspecified: Secondary | ICD-10-CM | POA: Diagnosis not present

## 2018-05-31 DIAGNOSIS — Z923 Personal history of irradiation: Secondary | ICD-10-CM

## 2018-05-31 DIAGNOSIS — Z5112 Encounter for antineoplastic immunotherapy: Secondary | ICD-10-CM | POA: Diagnosis not present

## 2018-05-31 LAB — COMPREHENSIVE METABOLIC PANEL
ALT: 17 U/L (ref 0–44)
AST: 20 U/L (ref 15–41)
Albumin: 3.3 g/dL — ABNORMAL LOW (ref 3.5–5.0)
Alkaline Phosphatase: 55 U/L (ref 38–126)
Anion gap: 9 (ref 5–15)
BUN: 19 mg/dL (ref 8–23)
CO2: 27 mmol/L (ref 22–32)
Calcium: 9.6 mg/dL (ref 8.9–10.3)
Chloride: 106 mmol/L (ref 98–111)
Creatinine, Ser: 0.81 mg/dL (ref 0.44–1.00)
GFR calc Af Amer: >60 mL/min (ref >60)
GFR calc non Af Amer: >60 mL/min (ref >60)
Glucose, Bld: 98 mg/dL (ref 70–99)
Potassium: 4.2 mmol/L (ref 3.5–5.1)
Sodium: 142 mmol/L (ref 135–145)
Total Bilirubin: 0.4 mg/dL (ref 0.3–1.2)
Total Protein: 6.8 g/dL (ref 6.5–8.1)

## 2018-05-31 LAB — CBC WITH DIFFERENTIAL (CANCER CENTER ONLY)
Abs Immature Granulocytes: 0.01 10*3/uL (ref 0.00–0.07)
Basophils Absolute: 0 10*3/uL (ref 0.0–0.1)
Basophils Relative: 1 %
Eosinophils Absolute: 0.3 10*3/uL (ref 0.0–0.5)
Eosinophils Relative: 7 %
HCT: 37.3 % (ref 36.0–46.0)
Hemoglobin: 12.1 g/dL (ref 12.0–15.0)
Immature Granulocytes: 0 %
Lymphocytes Relative: 13 %
Lymphs Abs: 0.6 10*3/uL — ABNORMAL LOW (ref 0.7–4.0)
MCH: 31.4 pg (ref 26.0–34.0)
MCHC: 32.4 g/dL (ref 30.0–36.0)
MCV: 96.9 fL (ref 80.0–100.0)
Monocytes Absolute: 0.4 10*3/uL (ref 0.1–1.0)
Monocytes Relative: 8 %
Neutro Abs: 3.1 10*3/uL (ref 1.7–7.7)
Neutrophils Relative %: 71 %
Platelet Count: 186 10*3/uL (ref 150–400)
RBC: 3.85 MIL/uL — ABNORMAL LOW (ref 3.87–5.11)
RDW: 12.5 % (ref 11.5–15.5)
WBC Count: 4.4 10*3/uL (ref 4.0–10.5)
nRBC: 0 % (ref 0.0–0.2)

## 2018-05-31 LAB — TOTAL PROTEIN, URINE DIPSTICK: Protein, ur: 100 mg/dL — AB

## 2018-05-31 MED ORDER — SODIUM CHLORIDE 0.9% FLUSH
10.0000 mL | Freq: Once | INTRAVENOUS | Status: AC
  Administered 2018-05-31: 10 mL
  Filled 2018-05-31: qty 10

## 2018-05-31 NOTE — Assessment & Plan Note (Signed)
Her last PET scan showed minimum changes even though the radiologist felt her disease has progressed We discussed various options She is not keen to repeat biopsy I think it is reasonable to continue single agent bevacizumab and plan to recheck imaging study again in 3 months, due next around February 2020 She agreed with the plan of care

## 2018-05-31 NOTE — Assessment & Plan Note (Addendum)
She has poorly controlled hypertension due to side effects of bevacizumab She has heavy proteinuria detected on urine test today I will hold bevacizumab and continue to work with her to optimize blood pressure control before resumption of bevacizumab I recommend increasing hydrochlorothiazide to full dose 25 mg daily I will call her next week to follow-up on blood pressure control at home

## 2018-05-31 NOTE — Telephone Encounter (Signed)
Scheduled appt per 12/27 los - pt aware - no print out needed - my chart active.

## 2018-05-31 NOTE — Progress Notes (Signed)
[place] OFFICE PROGRESS NOTE  Patient Care Team: [name], MD as PCP - General (Family Medicine) Reynold Bowen, MD as Consulting Physician (Endocrinology)  ASSESSMENT & PLAN:  Endometrial cancer [facility]) Her last PET scan showed minimum changes even though the radiologist felt her disease has progressed We discussed various options She is not keen to repeat biopsy I think it is reasonable to continue single agent bevacizumab and plan to recheck imaging study again in 3 months, due next around February 2020 She agreed with the plan of care  Essential hypertension She has poorly controlled hypertension due to side effects of bevacizumab She has heavy proteinuria detected on urine test today I will hold bevacizumab and continue to work with her to optimize blood pressure control before resumption of bevacizumab I recommend increasing hydrochlorothiazide to full dose 25 mg daily I will call her next week to follow-up on blood pressure control at home   No orders of the defined types were placed in this encounter.   INTERVAL HISTORY: Please see below for problem oriented charting. She returns for treatment today She felt that her blood pressure control at home has been good She denies headache or changes in vision Denies changes in bowel habits, nausea or abdominal bloating Her husband is not doing well at home due to recurrent kidney stones She denies new lymphadenopathy  SUMMARY OF ONCOLOGIC HISTORY: [place] [facility] testing done 11-2015 on surgical path from 2014: MS stable TMB low 4 muts/mb ATM [id number] ERBB3 T389K E2H2 rearrangement exon 9 PPP2R1A P179R TP53 [id number]    ER- APPROXIMATELY 25-35% STAINING IN NEOPLASTIC CELLS (INTERMEDIATE)  PR- APPROXIMATELY 25-35% STAINING IN NEOPLASTIC CELLS (STRONG)  Repeat biopsy 04/02/17: ER negative, Her 2 negative  Progressed on Doxil     Endometrial cancer ([place])   06/20/2012  Pathology Results    Biopsy positive for papillary serous carcinoma    06/20/2012 Genetic Testing    [facility] testing done 11-2015 on surgical path from 2014: MS stable TMB low 4 muts/mb ATM [id number] ERBB3 T389K E2H2 rearrangement exon 9 PPP2R1A P179R TP53 I195N     06/20/2012 Initial Diagnosis    Patient presented to PCP with intermittent vaginal bleeding since ~ Oct 2013, endometrial biopsy 06-20-12 with complex endometrial hyperplasia with atypia    06/26/2012 Imaging    Thickened endometrial lining in a postmenopausal patient experiencing vaginal bleeding. In the setting of post-menopausal bleeding, endometrial sampling is indicated to exclude carcinoma. No focal myometrial abnormalities are seen.  Normal left ovary and non-visualized right ovary    07/30/2012 Surgery    Dr. Polly Cobia performed robotic hysterectomy with bilateral salpingo-oophorectomy, bilateral pelvic lymph node dissection and periaortic lymph node dissection. Intraoperatively on frozen section, the patient was noted to have a large endometrial polyp with changes within the polyp consistent for high-grade malignancy, possibly papillary serous carcinoma. There is no obvious extrauterine disease noted.      07/30/2012 Pathology Results    [phone] SUPPLEMENTAL REPORT  THE ENDOMETRIAL CARCINOMA WAS ANALYZED FOR DNA MISMATCH REPAIR PROTEINS.IMMUNOHISTOCHEMICALLY, THE NEOPLASM RETAINED NUCLEAR EXPRESSION OF 4 GENE PRODUCTS, MLH1, MSH2, MSH6, AND PMS2, INVOLVED IN DNA MISMATCH REPAIR.POSITIVE AND NEGATIVE CONTROLS WORKED APPROPRIATELY.  PER REQUEST, AN ER AND PR ARE PERFORMED ON BLOCK 1I.  ER- APPROXIMATELY 25-35% STAINING IN NEOPLASTIC CELLS (INTERMEDIATE)  PR- APPROXIMATELY 25-35% STAINING IN NEOPLASTIC CELLS (STRONG)  ER AND PR PREDOMINANTLY SHOW STAINING IN THE SEROUS COMPONENT.  DIAGNOSIS  1. UTERUS, CERVIX WITH BILATERAL FALLOPIAN TUBES AND  OVARIES,  HYSTERECTOMY AND BILATERAL  SALPINGO-OOPHORECTOMY:  HIGH GRADE/POORLY DIFFERENTIATED ENDOMETRIAL ADENOCARCINOMA WITH SOLID AND SEROUS PAPILLARY COMPONENTS.  HISTOPATHOLOGIC TYPE:A VARIETY OF PATTERNS WERE PRESENT, ALL OF WHICH SHOULD BE CONSIDERED TO BE HIGH GRADE.SEROUS PAPILLARY CARCINOMA WAS SEEN OCCURRING ADJACENT TO SOLID ENDOMETRIAL CARCINOMA WITH MARKED ANAPLASIA AND GIANT CELLS. SIZE:TUMOR MEASURED AT LEAST 4.8 CM IN GREATEST HORIZONTAL DIMENSION.  GRADE: POORLY DIFFERENTIATED OR HIGH GRADE. DEPTH OF INVASION: NO DEFINITE MYOMETRIAL INVOLVEMENT WAS SEEN.THE TUMOR APPEARED CONFINED TO THE POLYP AS WELL AS SURFACE ENDOMETRIUM. WHERE MYOMETRIAL THICKNESS NOT APPLICABLE. SEROSAL INVOLVEMENT: NOT DEMONSTRATED.  ENDOCERVICAL INVOLVEMENT:NOT DEMONSTRATED. RESECTION MARGINS: FREE OF INVOLVEMENT. EXTRAUTERINE EXTENSION:NOT DEMONSTRATED. ANGIOLYMPHATIC INVASION: NOT DEFINITELY SEEN. TOTAL NODES EXAMINED:22.  PELVIC NODES EXAMINED:20.  PELVIC NODES INVOLVED:0.  PARA-AORTIC NODES 2. EXAMINED:  PARA-AORTIC NODES 0. INVOLVED TNM STAGE: T1A N0 MX. AJCC STAGE GROUPING: IA. FIGO STAGE:IA.    08/26/2012 Imaging    No CT evidence for intra-abdominal or pelvic metastatic disease. Trace free pelvic fluid, presumably postoperative although the date of surgery is not documented in the electronic medical record.    08/26/2012 - 12/13/2012 Chemotherapy    She received 6 cycles of carbo/taxol     10/29/2012 - 11/27/2012 Radiation Therapy    May 27, June 5,  June 11, June 19, November 27, 2012: Proximal vagina 30 Gy in 5 fractions      01/17/2013 Imaging    1.  No evidence of recurrent or metastatic disease. 2.  No acute abnormality involving the abdomen or pelvis. 3.  Mild diffuse hepatic steatosis. 4.  Very small supraumbilical midline anterior abdominal wall hernia containing fat, unchanged    01/22/2014 Imaging    No evidence for  metastatic or recurrent disease. 2. No bowel obstruction.  Normal appendix. 3. Small fat containing hernia, stable in appearance. 4. Status post hysterectomy and bilateral oophorectomy    02/19/2014 Imaging    No pulmonary lesions are identified. The abnormality on the chest x-ray is due to asymmetric left-sided sternoclavicular joint degenerative disease    10/12/2014 Imaging    New mild retroperitoneal lymphadenopathy in the left paraaortic region and proximal left common iliac chain, consistent with metastatic disease. No other sites of metastatic disease identified within the abdomen or pelvis.      11/27/2014 - 02/11/2015 Chemotherapy    She received 4 cycles of carbo/taxol     03/01/2015 PET scan    Single hypermetabolic small retroperitoneal lymph node along the aorta. 2. No evidence of metastatic disease otherwise in the abdomen or pelvis. No evidence local recurrence. 3. Intensely hypermetabolic enlarged nodule adjacent to the RIGHT lobe of thyroid gland. This presumably represents the biopsied lesion in clinician report which was found to be benign thyroid tissue.    04/05/2015 - 05/13/2015 Radiation Therapy    She received 50.4 gray in 28 fractions with simultaneous integrated boost to 56 gray    06/17/2015 Imaging    No acute process or evidence of metastatic disease in the abdomen or pelvis. Resolution of previously described retroperitoneal adenopathy. 2.  Possible constipation. 3. Atherosclerosis.    06/17/2015 Tumor Marker    Patient's tumor was tested for the following markers: CA125 Results of the tumor marker test revealed 33    08/12/2015 Tumor Marker    Patient's tumor was tested for the following markers: CA125 Results of the tumor marker test revealed 52    08/31/2015 PET scan    Development of right paratracheal hypermetabolic adenopathy, consistent with nodal metastasis. 2. The previously described  isolated abdominal retroperitoneal hypermetabolic node has resolved. 3.  Persistent hypermetabolic right thyroid nodule, per report previously biopsied. Correlate with those results. 4.  Possible constipation.    09/09/2015 -  Anti-estrogen oral therapy    She has been receiving alternative treatment between megace and tamoxifen    09/09/2015 Tumor Marker    Patient's tumor was tested for the following markers: CA125 Results of the tumor marker test revealed 37.8    09/20/2015 Procedure    Technically successful ultrasound-guided thyroid aspiration biopsy , dominant right nodule    09/20/2015 Pathology Results    THYROID, RIGHT, FINE NEEDLE ASPIRATION (SPECIMEN 1 OF 1 COLLECTED 09/20/15): FINDINGS CONSISTENT WITH BENIGN THYROID NODULE (BETHESDA CATEGORY II).    10/28/2015 Tumor Marker    Patient's tumor was tested for the following markers: CA125 Results of the tumor marker test revealed 53.2    11/04/2015 Imaging    Stable benign right thyroid nodule    12/16/2015 Tumor Marker    Patient's tumor was tested for the following markers: CA125 Results of the tumor marker test revealed 38.1    03/22/2016 PET scan    Interval progression of hypermetabolic right paratracheal lymph node consistent with metastatic involvement. 2. Stable hypermetabolic right thyroid nodule. Reportedly this has been biopsied in the past.    03/23/2016 Tumor Marker    Patient's tumor was tested for the following markers: CA125 Results of the tumor marker test revealed 62.4    04/13/2016 - 06/01/2016 Radiation Therapy    She received 56 Gy to the chest in 28 fractions     05/09/2016 Imaging    No evidence of left lower extremity deep vein thrombosis. No evidence of a superficial thrombosis of the greater and lesser saphenous veins. Positive for thrombus noted in several varicosities of the calf. No evidence of Baker's cyst on the left.    05/17/2016 Tumor Marker    Patient's tumor was tested for the following markers: CA125 Results of the tumor marker test revealed 9    06/29/2016 Tumor  Marker    Patient's tumor was tested for the following markers: CA125 Results of the tumor marker test revealed 5.9    08/04/2016 Tumor Marker    Patient's tumor was tested for the following markers: CA125 Results of the tumor marker test revealed 6.0    09/12/2016 PET scan    Complete metabolic response to therapy, with resolution of hypermetabolic mediastinal lymphadenopathy since prior exam. No residual or new metastatic disease identified. Stable hypermetabolic right thyroid lobe nodule, which was previously biopsied on 09/20/2015.    03/13/2017 PET scan    1. Solitary focus of recurrent right paratracheal hypermetabolic activity, with a 1.0 cm right lower paratracheal node having a maximum SUV of 9.0. Appearance compatible with recurrent malignancy. 2. Continued hypermetabolic right thyroid nodule, previously biopsied, presumed benign -correlate with prior biopsy results. 3. Other imaging findings of potential clinical significance: Aortic Atherosclerosis (ICD10-I70.0). Mild cardiomegaly. Prominent stool throughout the colon favors constipation.    04/02/2017 Pathology Results    FINE NEEDLE ASPIRATION, ENDOSCOPIC, (EBUS) 4 R NODE (SPECIMEN 1 OF 2 COLLECTED 04/02/17): MALIGNANT CELLS PRESENT, CONSISTENT WITH CARCINOMA. SEE COMMENT. COMMENT: THE MALIGNANT CELLS ARE POSITIVE FOR P53 AND NEGATIVE FOR ESTROGEN RECEPTOR AND TTF-1. THIS PROFILE IS NON-SPECIFIC, BUT THE P53 POSITIVE STAINING IS SUGGESTIVE OF GYNECOLOGIC PRIMARY.     04/11/2017 Procedure    Successful 8 French right internal jugular vein power port placement with its tip at the SVC/RA junction    04/12/2017  Imaging    Normal LV size with mild LV hypertrophy. EF 55-60%. Normal RV size and systolic function. Aortic valve sclerosis without significant stenosis.    04/18/2017 - 06/14/2017 Chemotherapy    The patient had chemotherapy with Doxil     07/10/2017 Imaging    - Left ventricle: The cavity size was normal. There was mild  concentric hypertrophy. Systolic function was normal. The estimated ejection fraction was in the range of 55% to 60%. Wall motion was normal; there were no regional wall motion abnormalities. Doppler parameters are consistent with abnormal left ventricular relaxation (grade 1 diastolic dysfunction). - Aortic valve: Noncoronary cusp mobility was mildly restricted. - Mitral valve: There was mild regurgitation. - Left atrium: The atrium was mildly dilated. - Atrial septum: No defect or patent foramen ovale was identified.  Impressions:  - Compared to November 2018, global LV longitudinal strain remains normal (has increased)    07/11/2017 PET scan    Increased size and hypermetabolic activity of 3.3 cm right thyroid lobe nodule. Thyroid carcinoma cannot be excluded. Recommend repeat ultrasound guided fine needle aspiration to exclude thyroid carcinoma.  New adjacent hypermetabolic 10 mm right supraclavicular lymph node, suspicious for lymph node metastasis.  Slight increase in size and hypermetabolic activity of solitary right paratracheal lymph node.  No evidence of abdominal or pelvic metastatic disease.    07/18/2017 Procedure    1. Technically successful ultrasound guided fine needle aspiration of indeterminate hypermetabolic right-sided thyroid nodule/mass. 2. Technically successful ultrasound-guided core needle biopsy of hypermetabolic right lower cervical lymph node.    07/18/2017 Pathology Results    THYROID, FINE NEEDLE ASPIRATION RIGHT (SPECIMEN 1 OF 1, COLLECTED ON 07/18/2017): ATYPIA OF UNDETERMINED SIGNIFICANCE OR FOLLICULAR LESION OF UNDETERMINED SIGNIFICANCE (BETHESDA CATEGORY III). SEE COMMENT. COMMENT: THE SPECIMEN CONSISTS OF SMALL AND MEDIUM SIZED GROUPS OF FOLLICULAR EPITHELIAL CELLS WITH MILD CYTOLOGIC ATYPIA INCLUDING NUCLEAR ENLARGEMENT AND HURTHLE CELL CHANGE. SOME GROUPS ARE ARRANGED AS MICROFOLLICLES. THERE IS MINIMAL BACKGROUND COLLOID. BASED ON THESE FEATURES, A  FOLLICULAR LESION/NEOPLASM CAN NOT BE ENTIRELY RULED OUT. A SPECIMEN WILL BE SENT FOR AFIRMA TESTING.    07/19/2017 Pathology Results    Lymph node, needle/core biopsy - METASTATIC PAPILLARY SEROUS CARCINOMA. - SEE COMMENT. Microscopic Comment Dr. Vicente Males has reviewed the case and concurs with this interpretation    10/15/2017 PET scan    No new or progressive disease. No evidence of abdominal or pelvic metastatic disease.  Stable hypermetabolic right thyroid lobe nodule and adjacent right supraclavicular lymph node.  Decreased size and hypermetabolic activity of solitary right paratracheal lymph node.    01/23/2018 PET scan    1. Overall, no significant change from PET-CT of 3 months ago. There is persistent hypermetabolic activity at the right thoracic inlet and within a right paratracheal mediastinal node. The nodes have not significantly changed in size, although the metabolic activity has minimally increased over this interval. It is uncertain how much of the thoracic inlet metabolic activity is attributed to the thyroid nodule versus adjacent cervical lymph nodes, both previously biopsied. 2. No new hypermetabolic activity within the neck, chest, abdomen or pelvis. No evidence of local recurrence in the pelvis. 3. Stable probable radiation changes in the right lung.    04/16/2018 PET scan    1. Interval increase in metabolic activity of the RIGHT supraclavicular node and RIGHT lower paratracheal mediastinal node with minimal change in size. Findings consistent with persistent and mildly progressed metastatic adenopathy. 2. New hypermetabolic small lymph node in the RIGHT  lower paratracheal nodal station adjacent to the previously followed node. 3. No evidence of local recurrence in the pelvis or new disease elsewhere.     REVIEW OF SYSTEMS:   Constitutional: Denies fevers, chills or abnormal weight loss Eyes: Denies blurriness of vision Ears, nose, mouth, throat, and face:  Denies mucositis or sore throat Respiratory: Denies cough, dyspnea or wheezes Cardiovascular: Denies palpitation, chest discomfort or lower extremity swelling Gastrointestinal:  Denies nausea, heartburn or change in bowel habits Skin: Denies abnormal skin rashes Lymphatics: Denies new lymphadenopathy or easy bruising Neurological:Denies numbness, tingling or new weaknesses Behavioral/Psych: Mood is stable, no new changes  All other systems were reviewed with the patient and are negative.  I have reviewed the past medical history, past surgical history, social history and family history with the patient and they are unchanged from previous note.  ALLERGIES:  has No Known Allergies.  MEDICATIONS:  Current Outpatient Medications  Medication Sig Dispense Refill  . amLODipine (NORVASC) 10 MG tablet TAKE 1 TABLET BY MOUTH DAILY. DECREASE SIMVASTATIN TO 20MG DAILY WHILE ON AMLODIPINE PER MD 90 tablet 0  . Calcium Carb-Cholecalciferol 500-600 MG-UNIT TABS Take 1 tablet by mouth daily.     . Cholecalciferol (VITAMIN D3) 2000 units TABS Take 2,000 Units by mouth daily.     . hydrochlorothiazide (HYDRODIURIL) 25 MG tablet Take 1 tablet (25 mg total) by mouth daily. 30 tablet 9  . LORazepam (ATIVAN) 0.5 MG tablet Take 0.5 mg by mouth 2 (two) times daily as needed.  0  . metoprolol tartrate (LOPRESSOR) 50 MG tablet TAKE 1 TABLET (50 MG TOTAL) BY MOUTH TWICE A DAY 60 tablet 1  . simvastatin (ZOCOR) 40 MG tablet Take 40 mg by mouth every evening.    . tacrolimus (PROTOPIC) 0.1 % ointment Apply 1 application topically daily as needed (rash).     . venlafaxine XR (EFFEXOR-XR) 75 MG 24 hr capsule Take 75 mg by mouth daily with breakfast.      No current facility-administered medications for this visit.     PHYSICAL EXAMINATION: ECOG PERFORMANCE STATUS: 1 - Symptomatic but completely ambulatory  Vitals:   05/31/18 0852 05/31/18 0853  BP: (!) 165/75 (!) 142/81  Pulse: 68   Resp: 18   Temp: 97.9 F  (36.6 C)   SpO2: 98%    Filed Weights   05/31/18 0852  Weight: 160 lb 9.6 oz (72.8 kg)    GENERAL:alert, no distress and comfortable Musculoskeletal:no cyanosis of digits and no clubbing  NEURO: alert & oriented x 3 with fluent speech, no focal motor/sensory deficits  LABORATORY DATA:  I have reviewed the data as listed    Component Value Date/Time   NA 139 05/10/2018 1212   NA 141 05/17/2017 0957   K 4.3 05/10/2018 1212   K 3.6 05/17/2017 0957   CL 103 05/10/2018 1212   CL 105 11/19/2012 0848   CO2 23 05/10/2018 1212   CO2 25 05/17/2017 0957   GLUCOSE 85 05/10/2018 1212   GLUCOSE 85 05/17/2017 0957   GLUCOSE 122 (H) 11/19/2012 0848   BUN 25 (H) 05/10/2018 1212   BUN 15.2 05/17/2017 0957   CREATININE 0.82 05/10/2018 1212   CREATININE 0.8 05/17/2017 0957   CALCIUM 9.8 05/10/2018 1212   CALCIUM 9.6 05/17/2017 0957   PROT 7.2 05/10/2018 1212   PROT 7.0 05/17/2017 0957   ALBUMIN 3.7 05/10/2018 1212   ALBUMIN 3.9 05/17/2017 0957   AST 23 05/10/2018 1212   AST 25 05/17/2017 0957  ALT 18 05/10/2018 1212   ALT 18 05/17/2017 0957   ALKPHOS 56 05/10/2018 1212   ALKPHOS 57 05/17/2017 0957   BILITOT 0.5 05/10/2018 1212   BILITOT 0.47 05/17/2017 0957   GFRNONAA >60 05/10/2018 1212   GFRAA >60 05/10/2018 1212    No results found for: SPEP, UPEP  Lab Results  Component Value Date   WBC 4.4 05/31/2018   NEUTROABS 3.1 05/31/2018   HGB 12.1 05/31/2018   HCT 37.3 05/31/2018   MCV 96.9 05/31/2018   PLT 186 05/31/2018      Chemistry      Component Value Date/Time   NA 139 05/10/2018 1212   NA 141 05/17/2017 0957   K 4.3 05/10/2018 1212   K 3.6 05/17/2017 0957   CL 103 05/10/2018 1212   CL 105 11/19/2012 0848   CO2 23 05/10/2018 1212   CO2 25 05/17/2017 0957   BUN 25 (H) 05/10/2018 1212   BUN 15.2 05/17/2017 0957   CREATININE 0.82 05/10/2018 1212   CREATININE 0.8 05/17/2017 0957   GLU 158 (H) 01/14/2015 1035      Component Value Date/Time   CALCIUM 9.8  05/10/2018 1212   CALCIUM 9.6 05/17/2017 0957   ALKPHOS 56 05/10/2018 1212   ALKPHOS 57 05/17/2017 0957   AST 23 05/10/2018 1212   AST 25 05/17/2017 0957   ALT 18 05/10/2018 1212   ALT 18 05/17/2017 0957   BILITOT 0.5 05/10/2018 1212   BILITOT 0.47 05/17/2017 0957      All questions were answered. The patient knows to call the clinic with any problems, questions or concerns. No barriers to learning was detected.  I spent 15 minutes counseling the patient face to face. The total time spent in the appointment was 20 minutes and more than 50% was on counseling and review of test results  [name], MD 05/31/2018 9:10 AM

## 2018-06-06 ENCOUNTER — Telehealth: Payer: Self-pay | Admitting: *Deleted

## 2018-06-06 NOTE — Telephone Encounter (Signed)
Left message for return call.

## 2018-06-06 NOTE — Telephone Encounter (Signed)
-----   Message from [name], MD sent at 06/06/2018  8:08 AM EST ----- Regarding: BP control Can you call and ask her how is her BP doing?

## 2018-06-08 ENCOUNTER — Encounter: Payer: Self-pay | Admitting: Hematology and Oncology

## 2018-06-10 ENCOUNTER — Other Ambulatory Visit: Payer: Self-pay | Admitting: Hematology and Oncology

## 2018-06-10 DIAGNOSIS — I10 Essential (primary) hypertension: Secondary | ICD-10-CM

## 2018-06-10 MED ORDER — LISINOPRIL 5 MG PO TABS: 5.0000 mg | ORAL_TABLET | Freq: Every day | ORAL | 1 refills | Status: DC

## 2018-06-10 MED FILL — LISINOPRIL 5 MG TABLET: 5 | 30 days supply | Qty: 30 | Fill #0

## 2018-06-12 MED FILL — busPIRone HCL 10 MG TABS: 10 | 30 days supply | Qty: 60 | Fill #1

## 2018-06-17 MED FILL — VENLAFAXINE HCL ER 150 MG C: 150 | 30 days supply | Qty: 60 | Fill #0

## 2018-06-18 DIAGNOSIS — F064 Anxiety disorder due to known physiological condition: Secondary | ICD-10-CM | POA: Diagnosis not present

## 2018-06-21 ENCOUNTER — Telehealth: Payer: Self-pay | Admitting: Hematology and Oncology

## 2018-06-21 ENCOUNTER — Encounter: Payer: Self-pay | Admitting: Hematology and Oncology

## 2018-06-21 ENCOUNTER — Inpatient Hospital Stay: Payer: 59 | Attending: Hematology and Oncology

## 2018-06-21 ENCOUNTER — Inpatient Hospital Stay (HOSPITAL_BASED_OUTPATIENT_CLINIC_OR_DEPARTMENT_OTHER): Payer: 59 | Admitting: Hematology and Oncology

## 2018-06-21 ENCOUNTER — Inpatient Hospital Stay: Payer: 59

## 2018-06-21 VITALS — BP 124/73

## 2018-06-21 DIAGNOSIS — Z923 Personal history of irradiation: Secondary | ICD-10-CM | POA: Diagnosis not present

## 2018-06-21 DIAGNOSIS — Z90710 Acquired absence of both cervix and uterus: Secondary | ICD-10-CM

## 2018-06-21 DIAGNOSIS — I10 Essential (primary) hypertension: Secondary | ICD-10-CM

## 2018-06-21 DIAGNOSIS — Z79899 Other long term (current) drug therapy: Secondary | ICD-10-CM | POA: Diagnosis not present

## 2018-06-21 DIAGNOSIS — Z95828 Presence of other vascular implants and grafts: Secondary | ICD-10-CM

## 2018-06-21 DIAGNOSIS — C541 Malignant neoplasm of endometrium: Secondary | ICD-10-CM | POA: Diagnosis not present

## 2018-06-21 DIAGNOSIS — Z9221 Personal history of antineoplastic chemotherapy: Secondary | ICD-10-CM | POA: Insufficient documentation

## 2018-06-21 DIAGNOSIS — Z9079 Acquired absence of other genital organ(s): Secondary | ICD-10-CM | POA: Diagnosis not present

## 2018-06-21 DIAGNOSIS — D61818 Other pancytopenia: Secondary | ICD-10-CM

## 2018-06-21 DIAGNOSIS — C770 Secondary and unspecified malignant neoplasm of lymph nodes of head, face and neck: Secondary | ICD-10-CM

## 2018-06-21 DIAGNOSIS — Z90722 Acquired absence of ovaries, bilateral: Secondary | ICD-10-CM | POA: Diagnosis not present

## 2018-06-21 DIAGNOSIS — Z7189 Other specified counseling: Secondary | ICD-10-CM

## 2018-06-21 DIAGNOSIS — Z5112 Encounter for antineoplastic immunotherapy: Secondary | ICD-10-CM | POA: Insufficient documentation

## 2018-06-21 LAB — COMPREHENSIVE METABOLIC PANEL
ALT: 17 U/L (ref 0–44)
AST: 19 U/L (ref 15–41)
Albumin: 3.5 g/dL (ref 3.5–5.0)
Alkaline Phosphatase: 59 U/L (ref 38–126)
Anion gap: 9 (ref 5–15)
BUN: 22 mg/dL (ref 8–23)
CO2: 26 mmol/L (ref 22–32)
Calcium: 9.5 mg/dL (ref 8.9–10.3)
Chloride: 106 mmol/L (ref 98–111)
Creatinine, Ser: 0.76 mg/dL (ref 0.44–1.00)
GFR calc Af Amer: >60 mL/min (ref >60)
GFR calc non Af Amer: >60 mL/min (ref >60)
Glucose, Bld: 101 mg/dL — ABNORMAL HIGH (ref 70–99)
Potassium: 4.2 mmol/L (ref 3.5–5.1)
Sodium: 141 mmol/L (ref 135–145)
Total Bilirubin: 0.3 mg/dL (ref 0.3–1.2)
Total Protein: 7 g/dL (ref 6.5–8.1)

## 2018-06-21 LAB — CBC WITH DIFFERENTIAL (CANCER CENTER ONLY)
Abs Immature Granulocytes: 0.01 10*3/uL (ref 0.00–0.07)
Basophils Absolute: 0.1 10*3/uL (ref 0.0–0.1)
Basophils Relative: 1 %
Eosinophils Absolute: 0.3 10*3/uL (ref 0.0–0.5)
Eosinophils Relative: 7 %
HCT: 35.8 % — ABNORMAL LOW (ref 36.0–46.0)
Hemoglobin: 11.8 g/dL — ABNORMAL LOW (ref 12.0–15.0)
Immature Granulocytes: 0 %
Lymphocytes Relative: 14 %
Lymphs Abs: 0.6 10*3/uL — ABNORMAL LOW (ref 0.7–4.0)
MCH: 31.8 pg (ref 26.0–34.0)
MCHC: 33 g/dL (ref 30.0–36.0)
MCV: 96.5 fL (ref 80.0–100.0)
Monocytes Absolute: 0.3 10*3/uL (ref 0.1–1.0)
Monocytes Relative: 7 %
Neutro Abs: 2.8 10*3/uL (ref 1.7–7.7)
Neutrophils Relative %: 71 %
Platelet Count: 184 10*3/uL (ref 150–400)
RBC: 3.71 MIL/uL — ABNORMAL LOW (ref 3.87–5.11)
RDW: 12.4 % (ref 11.5–15.5)
WBC Count: 3.9 10*3/uL — ABNORMAL LOW (ref 4.0–10.5)
nRBC: 0 % (ref 0.0–0.2)

## 2018-06-21 LAB — TOTAL PROTEIN, URINE DIPSTICK: Protein, ur: 30 mg/dL — AB

## 2018-06-21 MED ORDER — SODIUM CHLORIDE 0.9% FLUSH
10.0000 mL | INTRAVENOUS | Status: DC | PRN
  Administered 2018-06-21: 10 mL
  Filled 2018-06-21: qty 10

## 2018-06-21 MED ORDER — HEPARIN SOD (PORK) LOCK FLUSH 100 UNIT/ML IV SOLN
500.0000 [IU] | Freq: Once | INTRAVENOUS | Status: AC | PRN
  Administered 2018-06-21: 500 [IU]
  Filled 2018-06-21: qty 5

## 2018-06-21 MED ORDER — SODIUM CHLORIDE 0.9 % IV SOLN
14.8000 mg/kg | Freq: Once | INTRAVENOUS | Status: AC
  Administered 2018-06-21: 1100 mg via INTRAVENOUS
  Filled 2018-06-21: qty 32

## 2018-06-21 MED ORDER — SODIUM CHLORIDE 0.9% FLUSH
10.0000 mL | Freq: Once | INTRAVENOUS | Status: AC
  Administered 2018-06-21: 10 mL
  Filled 2018-06-21: qty 10

## 2018-06-21 MED ORDER — SODIUM CHLORIDE 0.9 % IV SOLN
Freq: Once | INTRAVENOUS | Status: AC
  Administered 2018-06-21: 11:00:00 via INTRAVENOUS
  Filled 2018-06-21: qty 250

## 2018-06-21 NOTE — Assessment & Plan Note (Signed)
Her blood pressure control has improved with additional blood pressure medication.  We will resume bevacizumab.  She will continue close monitoring of blood pressure

## 2018-06-21 NOTE — Progress Notes (Signed)
[facility] OFF[facility] PROGRESS NOTE  Patient Care Team: [name], MD as PCP - General (Family Medicine) Reynold Bowen, MD as Consulting Physician (Endocrinology)  ASSESSMENT & PLAN:  Endometrial cancer [place] [facility]) Her last PET scan showed minimum changes even though the radiologist felt her disease has progressed Overall, I would say that her PET scan looks stable I think it is reasonable to continue single agent bevacizumab and plan to recheck imaging study again in 3 months, due next around February 2020 She agreed with the plan of care  Essential hypertension Her blood pressure control has improved with additional blood pressure medication.  We will resume bevacizumab.  She will continue close monitoring of blood pressure  Pancytopenia, acquired ([place]) She has intermittent pancytopenia that fluctuate up and down Overall, she is not symptomatic I do not believe the bevacizumab is the cause of this.  We will monitor her blood counts carefully while on treatment   No orders of the defined types were placed in this encounter.   INTERVAL HISTORY: Please see below for problem oriented charting. She returns for further follow-up and treatment She feels well Denies headache, dizziness, blurriness of vision or leg swelling.  Her blood pressure is better controlled with additional blood pressure medicine recently. She denies cough, chest pain or shortness of breath.  No changes in bowel habits or bloating.  SUMMARY OF ONCOLOGIC HISTORY: Oncology History   [employer] testing done 11-2015 on surgical path from 2014: MS stable TMB low 4 muts/mb ATM [id number] ERBB3 T389K E2H2 rearrangement exon 9 PPP2R1A P179R TP53 [id number]    ER- APPROXIMATELY 25-35% STAINING IN NEOPLASTIC CELLS (INTERMEDIATE)  PR- APPROXIMATELY 25-35% STAINING IN NEOPLASTIC CELLS (STRONG)  Repeat biopsy 04/02/17: ER negative, Her 2 negative  Progressed on Doxil     Endometrial cancer  ([place])   06/20/2012 Pathology Results    Biopsy positive for papillary serous carcinoma    06/20/2012 Genetic Testing    [employer] testing done 11-2015 on surgical path from 2014: MS stable TMB low 4 muts/mb ATM [id number] ERBB3 T389K E2H2 rearrangement exon 9 PPP2R1A P179R TP53 I195N     06/20/2012 Initial Diagnosis    Patient presented to PCP with intermittent vaginal bleeding since ~ Oct 2013, endometrial biopsy 06-20-12 with complex endometrial hyperplasia with atypia    06/26/2012 Imaging    Thickened endometrial lining in a postmenopausal patient experiencing vaginal bleeding. In the setting of post-menopausal bleeding, endometrial sampling is indicated to exclude carcinoma. No focal myometrial abnormalities are seen.  Normal left ovary and non-visualized right ovary    07/30/2012 Surgery    Dr. Polly Cobia performed robotic hysterectomy with bilateral salpingo-oophorectomy, bilateral pelvic lymph node dissection and periaortic lymph node dissection. Intraoperatively on frozen section, the patient was noted to have a large endometrial polyp with changes within the polyp consistent for high-grade malignancy, possibly papillary serous carcinoma. There is no obvious extrauterine disease noted.      07/30/2012 Pathology Results    [id number] SUPPLEMENTAL REPORT  THE ENDOMETRIAL CARCINOMA WAS ANALYZED FOR DNA MISMATCH REPAIR PROTEINS.IMMUNOHISTOCHEMICALLY, THE NEOPLASM RETAINED NUCLEAR EXPRESSION OF 4 GENE PRODUCTS, MLH1, MSH2, MSH6, AND PMS2, INVOLVED IN DNA MISMATCH REPAIR.POSITIVE AND NEGATIVE CONTROLS WORKED APPROPRIATELY.  PER REQUEST, AN ER AND PR ARE PERFORMED ON BLOCK 1I.  ER- APPROXIMATELY 25-35% STAINING IN NEOPLASTIC CELLS (INTERMEDIATE)  PR- APPROXIMATELY 25-35% STAINING IN NEOPLASTIC CELLS (STRONG)  ER AND PR PREDOMINANTLY SHOW STAINING IN THE SEROUS COMPONENT.  DIAGNOSIS  1. UTERUS, CERVIX WITH BILATERAL FALLOPIAN TUBES AND  OVARIES,  HYSTERECTOMY AND  BILATERAL SALPINGO-OOPHORECTOMY:  HIGH GRADE/POORLY DIFFERENTIATED ENDOMETRIAL ADENOCARCINOMA WITH SOLID AND SEROUS PAPILLARY COMPONENTS.  HISTOPATHOLOGIC TYPE:A VARIETY OF PATTERNS WERE PRESENT, ALL OF WHICH SHOULD BE CONSIDERED TO BE HIGH GRADE.SEROUS PAPILLARY CARCINOMA WAS SEEN OCCURRING ADJACENT TO SOLID ENDOMETRIAL CARCINOMA WITH MARKED ANAPLASIA AND GIANT CELLS. SIZE:TUMOR MEASURED AT LEAST 4.8 CM IN GREATEST HORIZONTAL DIMENSION.  GRADE: POORLY DIFFERENTIATED OR HIGH GRADE. DEPTH OF INVASION: NO DEFINITE MYOMETRIAL INVOLVEMENT WAS SEEN.THE TUMOR APPEARED CONFINED TO THE POLYP AS WELL AS SURFACE ENDOMETRIUM. WHERE MYOMETRIAL THICKNESS NOT APPLICABLE. SEROSAL INVOLVEMENT: NOT DEMONSTRATED.  ENDOCERVICAL INVOLVEMENT:NOT DEMONSTRATED. RESECTION MARGINS: FREE OF INVOLVEMENT. EXTRAUTERINE EXTENSION:NOT DEMONSTRATED. ANGIOLYMPHATIC INVASION: NOT DEFINITELY SEEN. TOTAL NODES EXAMINED:22.  PELVIC NODES EXAMINED:20.  PELVIC NODES INVOLVED:0.  PARA-AORTIC NODES 2. EXAMINED:  PARA-AORTIC NODES 0. INVOLVED TNM STAGE: T1A N0 MX. AJCC STAGE GROUPING: IA. FIGO STAGE:IA.    08/26/2012 Imaging    No CT evidence for intra-abdominal or pelvic metastatic disease. Trace free pelvic fluid, presumably postoperative although the date of surgery is not documented in the electronic medical record.    08/26/2012 - 12/13/2012 Chemotherapy    She received 6 cycles of carbo/taxol     10/29/2012 - 11/27/2012 Radiation Therapy    May 27, June 5,  June 11, June 19, November 27, 2012: Proximal vagina 30 Gy in 5 fractions      01/17/2013 Imaging    1.  No evidence of recurrent or metastatic disease. 2.  No acute abnormality involving the abdomen or pelvis. 3.  Mild diffuse hepatic steatosis. 4.  Very small supraumbilical midline anterior abdominal wall hernia containing fat, unchanged    01/22/2014 Imaging    No  evidence for metastatic or recurrent disease. 2. No bowel obstruction.  Normal appendix. 3. Small fat containing hernia, stable in appearance. 4. Status post hysterectomy and bilateral oophorectomy    02/19/2014 Imaging    No pulmonary lesions are identified. The abnormality on the chest x-ray is due to asymmetric left-sided sternoclavicular joint degenerative disease    10/12/2014 Imaging    New mild retroperitoneal lymphadenopathy in the left paraaortic region and proximal left common iliac chain, consistent with metastatic disease. No other sites of metastatic disease identified within the abdomen or pelvis.      11/27/2014 - 02/11/2015 Chemotherapy    She received 4 cycles of carbo/taxol     03/01/2015 PET scan    Single hypermetabolic small retroperitoneal lymph node along the aorta. 2. No evidence of metastatic disease otherwise in the abdomen or pelvis. No evidence local recurrence. 3. Intensely hypermetabolic enlarged nodule adjacent to the RIGHT lobe of thyroid gland. This presumably represents the biopsied lesion in clinician report which was found to be benign thyroid tissue.    04/05/2015 - 05/13/2015 Radiation Therapy    She received 50.4 gray in 28 fractions with simultaneous integrated boost to 56 gray    06/17/2015 Imaging    No acute process or evidence of metastatic disease in the abdomen or pelvis. Resolution of previously described retroperitoneal adenopathy. 2.  Possible constipation. 3. Atherosclerosis.    06/17/2015 Tumor Marker    Patient's tumor was tested for the following markers: CA125 Results of the tumor marker test revealed 33    08/12/2015 Tumor Marker    Patient's tumor was tested for the following markers: CA125 Results of the tumor marker test revealed 52    08/31/2015 PET scan    Development of right paratracheal hypermetabolic adenopathy, consistent with nodal metastasis. 2. The previously described  isolated abdominal retroperitoneal hypermetabolic node has  resolved. 3. Persistent hypermetabolic right thyroid nodule, per report previously biopsied. Correlate with those results. 4.  Possible constipation.    09/09/2015 -  Anti-estrogen oral therapy    She has been receiving alternative treatment between megace and tamoxifen    09/09/2015 Tumor Marker    Patient's tumor was tested for the following markers: CA125 Results of the tumor marker test revealed 37.8    09/20/2015 Procedure    Technically successful ultrasound-guided thyroid aspiration biopsy , dominant right nodule    09/20/2015 Pathology Results    THYROID, RIGHT, FINE NEEDLE ASPIRATION (SPECIMEN 1 OF 1 COLLECTED 09/20/15): FINDINGS CONSISTENT WITH BENIGN THYROID NODULE (BETHESDA CATEGORY II).    10/28/2015 Tumor Marker    Patient's tumor was tested for the following markers: CA125 Results of the tumor marker test revealed 53.2    11/04/2015 Imaging    Stable benign right thyroid nodule    12/16/2015 Tumor Marker    Patient's tumor was tested for the following markers: CA125 Results of the tumor marker test revealed 38.1    03/22/2016 PET scan    Interval progression of hypermetabolic right paratracheal lymph node consistent with metastatic involvement. 2. Stable hypermetabolic right thyroid nodule. Reportedly this has been biopsied in the past.    03/23/2016 Tumor Marker    Patient's tumor was tested for the following markers: CA125 Results of the tumor marker test revealed 62.4    04/13/2016 - 06/01/2016 Radiation Therapy    She received 56 Gy to the chest in 28 fractions     05/09/2016 Imaging    No evidence of left lower extremity deep vein thrombosis. No evidence of a superficial thrombosis of the greater and lesser saphenous veins. Positive for thrombus noted in several varicosities of the calf. No evidence of Baker's cyst on the left.    05/17/2016 Tumor Marker    Patient's tumor was tested for the following markers: CA125 Results of the tumor marker test revealed 9     06/29/2016 Tumor Marker    Patient's tumor was tested for the following markers: CA125 Results of the tumor marker test revealed 5.9    08/04/2016 Tumor Marker    Patient's tumor was tested for the following markers: CA125 Results of the tumor marker test revealed 6.0    09/12/2016 PET scan    Complete metabolic response to therapy, with resolution of hypermetabolic mediastinal lymphadenopathy since prior exam. No residual or new metastatic disease identified. Stable hypermetabolic right thyroid lobe nodule, which was previously biopsied on 09/20/2015.    03/13/2017 PET scan    1. Solitary focus of recurrent right paratracheal hypermetabolic activity, with a 1.0 cm right lower paratracheal node having a maximum SUV of 9.0. Appearance compatible with recurrent malignancy. 2. Continued hypermetabolic right thyroid nodule, previously biopsied, presumed benign -correlate with prior biopsy results. 3. Other imaging findings of potential clinical significance: Aortic Atherosclerosis (ICD10-I70.0). Mild cardiomegaly. Prominent stool throughout the colon favors constipation.    04/02/2017 Pathology Results    FINE NEEDLE ASPIRATION, ENDOSCOPIC, (EBUS) 4 R NODE (SPECIMEN 1 OF 2 COLLECTED 04/02/17): MALIGNANT CELLS PRESENT, CONSISTENT WITH CARCINOMA. SEE COMMENT. COMMENT: THE MALIGNANT CELLS ARE POSITIVE FOR P53 AND NEGATIVE FOR ESTROGEN RECEPTOR AND TTF-1. THIS PROFILE IS NON-SPECIFIC, BUT THE P53 POSITIVE STAINING IS SUGGESTIVE OF GYNECOLOGIC PRIMARY.     04/11/2017 Procedure    Successful 8 French right internal jugular vein power port placement with its tip at the SVC/RA junction    04/12/2017  Imaging    Normal LV size with mild LV hypertrophy. EF 55-60%. Normal RV size and systolic function. Aortic valve sclerosis without significant stenosis.    04/18/2017 - 06/14/2017 Chemotherapy    The patient had chemotherapy with Doxil     07/10/2017 Imaging    - Left ventricle: The cavity size was normal.  There was mild concentric hypertrophy. Systolic function was normal. The estimated ejection fraction was in the range of 55% to 60%. Wall motion was normal; there were no regional wall motion abnormalities. Doppler parameters are consistent with abnormal left ventricular relaxation (grade 1 diastolic dysfunction). - Aortic valve: Noncoronary cusp mobility was mildly restricted. - Mitral valve: There was mild regurgitation. - Left atrium: The atrium was mildly dilated. - Atrial septum: No defect or patent foramen ovale was identified.  Impressions:  - Compared to November 2018, global LV longitudinal strain remains normal (has increased)    07/11/2017 PET scan    Increased size and hypermetabolic activity of 3.3 cm right thyroid lobe nodule. Thyroid carcinoma cannot be excluded. Recommend repeat ultrasound guided fine needle aspiration to exclude thyroid carcinoma.  New adjacent hypermetabolic 10 mm right supraclavicular lymph node, suspicious for lymph node metastasis.  Slight increase in size and hypermetabolic activity of solitary right paratracheal lymph node.  No evidence of abdominal or pelvic metastatic disease.    07/18/2017 Procedure    1. Technically successful ultrasound guided fine needle aspiration of indeterminate hypermetabolic right-sided thyroid nodule/mass. 2. Technically successful ultrasound-guided core needle biopsy of hypermetabolic right lower cervical lymph node.    07/18/2017 Pathology Results    THYROID, FINE NEEDLE ASPIRATION RIGHT (SPECIMEN 1 OF 1, COLLECTED ON 07/18/2017): ATYPIA OF UNDETERMINED SIGNIFICANCE OR FOLLICULAR LESION OF UNDETERMINED SIGNIFICANCE (BETHESDA CATEGORY III). SEE COMMENT. COMMENT: THE SPECIMEN CONSISTS OF SMALL AND MEDIUM SIZED GROUPS OF FOLLICULAR EPITHELIAL CELLS WITH MILD CYTOLOGIC ATYPIA INCLUDING NUCLEAR ENLARGEMENT AND HURTHLE CELL CHANGE. SOME GROUPS ARE ARRANGED AS MICROFOLLICLES. THERE IS MINIMAL BACKGROUND COLLOID. BASED ON THESE  FEATURES, A FOLLICULAR LESION/NEOPLASM CAN NOT BE ENTIRELY RULED OUT. A SPECIMEN WILL BE SENT FOR AFIRMA TESTING.    07/19/2017 Pathology Results    Lymph node, needle/core biopsy - METASTATIC PAPILLARY SEROUS CARCINOMA. - SEE COMMENT. Microscopic Comment Dr. Vicente Males has reviewed the case and concurs with this interpretation    10/15/2017 PET scan    No new or progressive disease. No evidence of abdominal or pelvic metastatic disease.  Stable hypermetabolic right thyroid lobe nodule and adjacent right supraclavicular lymph node.  Decreased size and hypermetabolic activity of solitary right paratracheal lymph node.    01/23/2018 PET scan    1. Overall, no significant change from PET-CT of 3 months ago. There is persistent hypermetabolic activity at the right thoracic inlet and within a right paratracheal mediastinal node. The nodes have not significantly changed in size, although the metabolic activity has minimally increased over this interval. It is uncertain how much of the thoracic inlet metabolic activity is attributed to the thyroid nodule versus adjacent cervical lymph nodes, both previously biopsied. 2. No new hypermetabolic activity within the neck, chest, abdomen or pelvis. No evidence of local recurrence in the pelvis. 3. Stable probable radiation changes in the right lung.    04/16/2018 PET scan    1. Interval increase in metabolic activity of the RIGHT supraclavicular node and RIGHT lower paratracheal mediastinal node with minimal change in size. Findings consistent with persistent and mildly progressed metastatic adenopathy. 2. New hypermetabolic small lymph node in the RIGHT  lower paratracheal nodal station adjacent to the previously followed node. 3. No evidence of local recurrence in the pelvis or new disease elsewhere.     REVIEW OF SYSTEMS:   Constitutional: Denies fevers, chills or abnormal weight loss Eyes: Denies blurriness of vision Ears, nose, mouth, throat, and  face: Denies mucositis or sore throat Respiratory: Denies cough, dyspnea or wheezes Cardiovascular: Denies palpitation, chest discomfort or lower extremity swelling Gastrointestinal:  Denies nausea, heartburn or change in bowel habits Skin: Denies abnormal skin rashes Lymphatics: Denies new lymphadenopathy or easy bruising Neurological:Denies numbness, tingling or new weaknesses Behavioral/Psych: Mood is stable, no new changes  All other systems were reviewed with the patient and are negative.  I have reviewed the past medical history, past surgical history, social history and family history with the patient and they are unchanged from previous note.  ALLERGIES:  has No Known Allergies.  MEDICATIONS:  Current Outpatient Medications  Medication Sig Dispense Refill  . amLODipine (NORVASC) 10 MG tablet TAKE 1 TABLET BY MOUTH DAILY. DECREASE SIMVASTATIN TO 20MG DAILY WHILE ON AMLODIPINE PER MD 90 tablet 0  . Calcium Carb-Cholecalciferol 500-600 MG-UNIT TABS Take 1 tablet by mouth daily.     . Cholecalciferol (VITAMIN D3) 2000 units TABS Take 2,000 Units by mouth daily.     . hydrochlorothiazide (HYDRODIURIL) 25 MG tablet Take 1 tablet (25 mg total) by mouth daily. 30 tablet 9  . lisinopril (PRINIVIL,ZESTRIL) 5 MG tablet Take 1 tablet (5 mg total) by mouth daily. 30 tablet 1  . LORazepam (ATIVAN) 0.5 MG tablet Take 0.5 mg by mouth 2 (two) times daily as needed.  0  . metoprolol tartrate (LOPRESSOR) 50 MG tablet TAKE 1 TABLET (50 MG TOTAL) BY MOUTH TWICE A DAY 60 tablet 1  . simvastatin (ZOCOR) 40 MG tablet Take 40 mg by mouth every evening.    . tacrolimus (PROTOPIC) 0.1 % ointment Apply 1 application topically daily as needed (rash).     . venlafaxine XR (EFFEXOR-XR) 75 MG 24 hr capsule Take 75 mg by mouth daily with breakfast.      No current facility-administered medications for this visit.     PHYSICAL EXAMINATION: ECOG PERFORMANCE STATUS: 0 - Asymptomatic  Vitals:   06/21/18 1006   BP: (!) 132/59  Pulse: 71  Resp: 18  Temp: 97.6 F (36.4 C)  SpO2: 98%   Filed Weights   06/21/18 1006  Weight: 161 lb (73 kg)    GENERAL:alert, no distress and comfortable SKIN: skin color, texture, turgor are normal, no rashes or significant lesions EYES: normal, Conjunctiva are pink and non-injected, sclera clear OROPHARYNX:no exudate, no erythema and lips, buccal mucosa, and tongue normal  NECK: supple, thyroid normal size, non-tender, without nodularity LYMPH:  no palpable lymphadenopathy in the cervical, axillary or inguinal LUNGS: clear to auscultation and percussion with normal breathing effort HEART: regular rate & rhythm and no murmurs and no lower extremity edema ABDOMEN:abdomen soft, non-tender and normal bowel sounds Musculoskeletal:no cyanosis of digits and no clubbing  NEURO: alert & oriented x 3 with fluent speech, no focal motor/sensory deficits  LABORATORY DATA:  I have reviewed the data as listed    Component Value Date/Time   NA 141 06/21/2018 0934   NA 141 05/17/2017 0957   K 4.2 06/21/2018 0934   K 3.6 05/17/2017 0957   CL 106 06/21/2018 0934   CL 105 11/19/2012 0848   CO2 26 06/21/2018 0934   CO2 25 05/17/2017 0957   GLUCOSE 101 (H) 06/21/2018  0934   GLUCOSE 85 05/17/2017 0957   GLUCOSE 122 (H) 11/19/2012 0848   BUN 22 06/21/2018 0934   BUN 15.2 05/17/2017 0957   CREATININE 0.76 06/21/2018 0934   CREATININE 0.8 05/17/2017 0957   CALCIUM 9.5 06/21/2018 0934   CALCIUM 9.6 05/17/2017 0957   PROT 7.0 06/21/2018 0934   PROT 7.0 05/17/2017 0957   ALBUMIN 3.5 06/21/2018 0934   ALBUMIN 3.9 05/17/2017 0957   AST 19 06/21/2018 0934   AST 25 05/17/2017 0957   ALT 17 06/21/2018 0934   ALT 18 05/17/2017 0957   ALKPHOS 59 06/21/2018 0934   ALKPHOS 57 05/17/2017 0957   BILITOT 0.3 06/21/2018 0934   BILITOT 0.47 05/17/2017 0957   GFRNONAA >60 06/21/2018 0934   GFRAA >60 06/21/2018 0934    No results found for: SPEP, UPEP  Lab Results  Component  Value Date   WBC 3.9 (L) 06/21/2018   NEUTROABS 2.8 06/21/2018   HGB 11.8 (L) 06/21/2018   HCT 35.8 (L) 06/21/2018   MCV 96.5 06/21/2018   PLT 184 06/21/2018      Chemistry      Component Value Date/Time   NA 141 06/21/2018 0934   NA 141 05/17/2017 0957   K 4.2 06/21/2018 0934   K 3.6 05/17/2017 0957   CL 106 06/21/2018 0934   CL 105 11/19/2012 0848   CO2 26 06/21/2018 0934   CO2 25 05/17/2017 0957   BUN 22 06/21/2018 0934   BUN 15.2 05/17/2017 0957   CREATININE 0.76 06/21/2018 0934   CREATININE 0.8 05/17/2017 0957   GLU 158 (H) 01/14/2015 1035      Component Value Date/Time   CALCIUM 9.5 06/21/2018 0934   CALCIUM 9.6 05/17/2017 0957   ALKPHOS 59 06/21/2018 0934   ALKPHOS 57 05/17/2017 0957   AST 19 06/21/2018 0934   AST 25 05/17/2017 0957   ALT 17 06/21/2018 0934   ALT 18 05/17/2017 0957   BILITOT 0.3 06/21/2018 0934   BILITOT 0.47 05/17/2017 0957      All questions were answered. The patient knows to call the clinic with any problems, questions or concerns. No barriers to learning was detected.  I spent 15 minutes counseling the patient face to face. The total time spent in the appointment was 20 minutes and more than 50% was on counseling and review of test results  [name], MD 06/21/2018 5:42 PM

## 2018-06-21 NOTE — Telephone Encounter (Signed)
No los °

## 2018-06-21 NOTE — Assessment & Plan Note (Signed)
She has intermittent pancytopenia that fluctuate up and down Overall, she is not symptomatic I do not believe the bevacizumab is the cause of this.  We will monitor her blood counts carefully while on treatment

## 2018-06-21 NOTE — Assessment & Plan Note (Signed)
Her last PET scan showed minimum changes even though the radiologist felt her disease has progressed Overall, I would say that her PET scan looks stable I think it is reasonable to continue single agent bevacizumab and plan to recheck imaging study again in 3 months, due next around February 2020 She agreed with the plan of care

## 2018-06-24 ENCOUNTER — Other Ambulatory Visit: Payer: Self-pay | Admitting: Hematology and Oncology

## 2018-06-24 MED FILL — METOPROLOL TARTRATE 50 MG T: 50 | 30 days supply | Qty: 60 | Fill #1

## 2018-06-24 MED FILL — HYDROCHLOROTHIAZIDE 25 MG T: 25 | 90 days supply | Qty: 90 | Fill #1

## 2018-06-24 MED FILL — AMLODIPINE BESYLATE 10 MG T: 10 | 90 days supply | Qty: 90 | Fill #0

## 2018-07-08 DIAGNOSIS — D72819 Decreased white blood cell count, unspecified: Secondary | ICD-10-CM | POA: Diagnosis not present

## 2018-07-08 DIAGNOSIS — M859 Disorder of bone density and structure, unspecified: Secondary | ICD-10-CM | POA: Diagnosis not present

## 2018-07-08 DIAGNOSIS — C541 Malignant neoplasm of endometrium: Secondary | ICD-10-CM | POA: Diagnosis not present

## 2018-07-08 DIAGNOSIS — E041 Nontoxic single thyroid nodule: Secondary | ICD-10-CM | POA: Diagnosis not present

## 2018-07-08 MED FILL — LISINOPRIL 5 MG TABLET: 5 | 30 days supply | Qty: 30 | Fill #1

## 2018-07-12 ENCOUNTER — Inpatient Hospital Stay: Payer: 59

## 2018-07-12 ENCOUNTER — Inpatient Hospital Stay: Payer: 59 | Attending: Hematology and Oncology

## 2018-07-12 ENCOUNTER — Encounter: Payer: Self-pay | Admitting: Hematology and Oncology

## 2018-07-12 ENCOUNTER — Telehealth: Payer: Self-pay | Admitting: Hematology and Oncology

## 2018-07-12 ENCOUNTER — Inpatient Hospital Stay (HOSPITAL_BASED_OUTPATIENT_CLINIC_OR_DEPARTMENT_OTHER): Payer: 59 | Admitting: Hematology and Oncology

## 2018-07-12 VITALS — BP 107/66 | HR 65 | Temp 97.6°F | Resp 18 | Ht 63.0 in | Wt 159.6 lb

## 2018-07-12 DIAGNOSIS — D61818 Other pancytopenia: Secondary | ICD-10-CM

## 2018-07-12 DIAGNOSIS — C786 Secondary malignant neoplasm of retroperitoneum and peritoneum: Secondary | ICD-10-CM | POA: Insufficient documentation

## 2018-07-12 DIAGNOSIS — Z9221 Personal history of antineoplastic chemotherapy: Secondary | ICD-10-CM | POA: Diagnosis not present

## 2018-07-12 DIAGNOSIS — C541 Malignant neoplasm of endometrium: Secondary | ICD-10-CM | POA: Diagnosis not present

## 2018-07-12 DIAGNOSIS — Z90722 Acquired absence of ovaries, bilateral: Secondary | ICD-10-CM

## 2018-07-12 DIAGNOSIS — C770 Secondary and unspecified malignant neoplasm of lymph nodes of head, face and neck: Secondary | ICD-10-CM

## 2018-07-12 DIAGNOSIS — Z79899 Other long term (current) drug therapy: Secondary | ICD-10-CM

## 2018-07-12 DIAGNOSIS — Z5112 Encounter for antineoplastic immunotherapy: Secondary | ICD-10-CM | POA: Diagnosis not present

## 2018-07-12 DIAGNOSIS — Z923 Personal history of irradiation: Secondary | ICD-10-CM | POA: Insufficient documentation

## 2018-07-12 DIAGNOSIS — I10 Essential (primary) hypertension: Secondary | ICD-10-CM | POA: Diagnosis not present

## 2018-07-12 DIAGNOSIS — Z7189 Other specified counseling: Secondary | ICD-10-CM

## 2018-07-12 DIAGNOSIS — Z95828 Presence of other vascular implants and grafts: Secondary | ICD-10-CM

## 2018-07-12 DIAGNOSIS — Z90710 Acquired absence of both cervix and uterus: Secondary | ICD-10-CM | POA: Diagnosis not present

## 2018-07-12 LAB — COMPREHENSIVE METABOLIC PANEL
ALT: 15 U/L (ref 0–44)
AST: 20 U/L (ref 15–41)
Albumin: 3.7 g/dL (ref 3.5–5.0)
Alkaline Phosphatase: 54 U/L (ref 38–126)
Anion gap: 9 (ref 5–15)
BUN: 24 mg/dL — ABNORMAL HIGH (ref 8–23)
CO2: 28 mmol/L (ref 22–32)
Calcium: 9.9 mg/dL (ref 8.9–10.3)
Chloride: 102 mmol/L (ref 98–111)
Creatinine, Ser: 0.81 mg/dL (ref 0.44–1.00)
GFR calc Af Amer: >60 mL/min (ref >60)
GFR calc non Af Amer: >60 mL/min (ref >60)
Glucose, Bld: 84 mg/dL (ref 70–99)
Potassium: 4.4 mmol/L (ref 3.5–5.1)
Sodium: 139 mmol/L (ref 135–145)
Total Bilirubin: 0.7 mg/dL (ref 0.3–1.2)
Total Protein: 7.1 g/dL (ref 6.5–8.1)

## 2018-07-12 LAB — CBC WITH DIFFERENTIAL (CANCER CENTER ONLY)
Abs Immature Granulocytes: 0 10*3/uL (ref 0.00–0.07)
Basophils Absolute: 0.1 10*3/uL (ref 0.0–0.1)
Basophils Relative: 1 %
Eosinophils Absolute: 0.3 10*3/uL (ref 0.0–0.5)
Eosinophils Relative: 6 %
HCT: 35.1 % — ABNORMAL LOW (ref 36.0–46.0)
Hemoglobin: 11.7 g/dL — ABNORMAL LOW (ref 12.0–15.0)
Immature Granulocytes: 0 %
Lymphocytes Relative: 16 %
Lymphs Abs: 0.7 10*3/uL (ref 0.7–4.0)
MCH: 32.1 pg (ref 26.0–34.0)
MCHC: 33.3 g/dL (ref 30.0–36.0)
MCV: 96.2 fL (ref 80.0–100.0)
Monocytes Absolute: 0.3 10*3/uL (ref 0.1–1.0)
Monocytes Relative: 8 %
Neutro Abs: 2.8 10*3/uL (ref 1.7–7.7)
Neutrophils Relative %: 69 %
Platelet Count: 187 10*3/uL (ref 150–400)
RBC: 3.65 MIL/uL — ABNORMAL LOW (ref 3.87–5.11)
RDW: 12.5 % (ref 11.5–15.5)
WBC Count: 4.1 10*3/uL (ref 4.0–10.5)
nRBC: 0 % (ref 0.0–0.2)

## 2018-07-12 LAB — TOTAL PROTEIN, URINE DIPSTICK: Protein, ur: <30 mg/dL — AB

## 2018-07-12 MED ORDER — SODIUM CHLORIDE 0.9% FLUSH
10.0000 mL | Freq: Once | INTRAVENOUS | Status: AC
  Administered 2018-07-12: 10 mL
  Filled 2018-07-12: qty 10

## 2018-07-12 MED ORDER — SODIUM CHLORIDE 0.9 % IV SOLN
14.8000 mg/kg | Freq: Once | INTRAVENOUS | Status: AC
  Administered 2018-07-12: 1100 mg via INTRAVENOUS
  Filled 2018-07-12: qty 32

## 2018-07-12 MED ORDER — SODIUM CHLORIDE 0.9 % IV SOLN
Freq: Once | INTRAVENOUS | Status: AC
  Administered 2018-07-12: 11:00:00 via INTRAVENOUS
  Filled 2018-07-12: qty 250

## 2018-07-12 MED ORDER — HEPARIN SOD (PORK) LOCK FLUSH 100 UNIT/ML IV SOLN
500.0000 [IU] | Freq: Once | INTRAVENOUS | Status: AC | PRN
  Administered 2018-07-12: 500 [IU]
  Filled 2018-07-12: qty 5

## 2018-07-12 MED ORDER — SODIUM CHLORIDE 0.9% FLUSH
10.0000 mL | INTRAVENOUS | Status: DC | PRN
  Administered 2018-07-12: 10 mL
  Filled 2018-07-12: qty 10

## 2018-07-12 NOTE — Telephone Encounter (Signed)
Gave avs and calendar ° °

## 2018-07-12 NOTE — Assessment & Plan Note (Signed)
She has intermittent pancytopenia that fluctuate up and down Overall, she is not symptomatic I do not believe the bevacizumab is the cause of this.  We will monitor her blood counts carefully while on treatment

## 2018-07-12 NOTE — Patient Instructions (Signed)
[facility] Discharge Instructions for Patients Receiving Chemotherapy  Today you received the following chemotherapy agents: Bevacizumab (Avastin)  To help prevent nausea and vomiting after your treatment, we encourage you to take your nausea medication as directed.    If you develop nausea and vomiting that is not controlled by your nausea medication, call the clinic.   BELOW ARE SYMPTOMS THAT SHOULD BE REPORTED IMMEDIATELY:  *FEVER GREATER THAN 100.5 F  *CHILLS WITH OR WITHOUT FEVER  NAUSEA AND VOMITING THAT IS NOT CONTROLLED WITH YOUR NAUSEA MEDICATION  *UNUSUAL SHORTNESS OF BREATH  *UNUSUAL BRUISING OR BLEEDING  TENDERNESS IN MOUTH AND THROAT WITH OR WITHOUT PRESENCE OF ULCERS  *URINARY PROBLEMS  *BOWEL PROBLEMS  UNUSUAL RASH Items with * indicate a potential emergency and should be followed up as soon as possible.  Feel free to call the clinic should you have any questions or concerns. The clinic phone number is [phone].  Please show the CHEMO ALERT CARD at check-in to the Emergency Department and triage nurse.   

## 2018-07-12 NOTE — Assessment & Plan Note (Signed)
Her last PET scan showed minimum changes even though the radiologist felt her disease has progressed Overall, I would say that her PET scan looks stable I think it is reasonable to continue single agent bevacizumab and plan to recheck imaging study again due next around end of the month She agreed with the plan of care

## 2018-07-12 NOTE — Assessment & Plan Note (Signed)
Her blood pressure control has improved with additional blood pressure medication.  We will continue bevacizumab.  She will continue close monitoring of blood pressure

## 2018-07-12 NOTE — Progress Notes (Signed)
[facility] OFFICE PROGRESS NOTE  Patient Care Team: [name], MD as PCP - General (Family Medicine) Reynold Bowen, MD as Consulting Physician (Endocrinology)  ASSESSMENT & PLAN:  Endometrial cancer [facility]) Her last PET scan showed minimum changes even though the radiologist felt her disease has progressed Overall, I would say that her PET scan looks stable I think it is reasonable to continue single agent bevacizumab and plan to recheck imaging study again due next around end of the month She agreed with the plan of care  Essential hypertension Her blood pressure control has improved with additional blood pressure medication.  We will continue bevacizumab.  She will continue close monitoring of blood pressure  Pancytopenia, acquired ([place]) She has intermittent pancytopenia that fluctuate up and down Overall, she is not symptomatic I do not believe the bevacizumab is the cause of this.  We will monitor her blood counts carefully while on treatment   Orders Placed This Encounter  Procedures  . NM PET Image Restag (PS) Skull Base To Thigh    Standing Status:   Future    Standing Expiration Date:   07/13/2019    Order Specific Question:   If indicated for the ordered procedure, I authorize the administration of a radiopharmaceutical per Radiology protocol    Answer:   Yes    Order Specific Question:   Preferred imaging location?    Answer:   [facility]    Order Specific Question:   Radiology Contrast Protocol - do NOT remove file path    Answer:   _[email or web address]    INTERVAL HISTORY: Please see below for problem oriented charting. She returns for chemotherapy and follow-up She feels well She denies recent headache No chest pain or shortness of breath No new lymphadenopathy Her blood pressure control has improved at home  [place]: [facility] testing done 11-2015 on surgical path  from 2014: MS stable TMB low 4 muts/mb ATM [id number] ERBB3 T389K E2H2 rearrangement exon 9 PPP2R1A P179R TP53 [id number]    ER- APPROXIMATELY 25-35% STAINING IN NEOPLASTIC CELLS (INTERMEDIATE)  PR- APPROXIMATELY 25-35% STAINING IN NEOPLASTIC CELLS (STRONG)  Repeat biopsy 04/02/17: ER negative, Her 2 negative  Progressed on Doxil     [place]omet[place]   06/20/2012 Pathology Results    Biopsy positive for papillary serous carcinoma    06/20/2012 Genetic Testing    [facility] testing done 11-2015 on surgical path from 2014: MS stable TMB low 4 muts/mb ATM [id number] ERBB3 T389K E2H2 rearrangement exon 9 PPP2R1A P179R TP53 I195N     06/20/2012 Initial Diagnosis    Patient presented to PCP with intermittent vaginal bleeding since ~ Oct 2013, endometrial biopsy 06-20-12 with complex endometrial hyperplasia with atypia    06/26/2012 Imaging    Thickened endometrial lining in a postmenopausal patient experiencing vaginal bleeding. In the setting of post-menopausal bleeding, endometrial sampling is indicated to exclude carcinoma. No focal myometrial abnormalities are seen.  Normal left ovary and non-visualized right ovary    07/30/2012 Surgery    Dr. Polly Cobia performed robotic hysterectomy with bilateral salpingo-oophorectomy, bilateral pelvic lymph node dissection and periaortic lymph node dissection. Intraoperatively on frozen section, the patient was noted to have a large endometrial polyp with changes within the polyp consistent for high-grade malignancy, possibly papillary serous carcinoma. There is no obvious extrauterine disease noted.      07/30/2012 Pathology Results    [phone] SUPPLEMENTAL REPORT  THE ENDOMETRIAL CARCINOMA WAS ANALYZED FOR  DNA MISMATCH REPAIR PROTEINS.IMMUNOHISTOCHEMICALLY, THE NEOPLASM RETAINED NUCLEAR EXPRESSION OF 4 GENE PRODUCTS, MLH1, MSH2, MSH6, AND PMS2, INVOLVED IN DNA MISMATCH REPAIR.POSITIVE AND NEGATIVE  CONTROLS WORKED APPROPRIATELY.  PER REQUEST, AN ER AND PR ARE PERFORMED ON BLOCK 1I.  ER- APPROXIMATELY 25-35% STAINING IN NEOPLASTIC CELLS (INTERMEDIATE)  PR- APPROXIMATELY 25-35% STAINING IN NEOPLASTIC CELLS (STRONG)  ER AND PR PREDOMINANTLY SHOW STAINING IN THE SEROUS COMPONENT.  DIAGNOSIS  1. UTERUS, CERVIX WITH BILATERAL FALLOPIAN TUBES AND OVARIES,  HYSTERECTOMY AND BILATERAL SALPINGO-OOPHORECTOMY:  HIGH GRADE/POORLY DIFFERENTIATED ENDOMETRIAL ADENOCARCINOMA WITH SOLID AND SEROUS PAPILLARY COMPONENTS.  HISTOPATHOLOGIC TYPE:A VARIETY OF PATTERNS WERE PRESENT, ALL OF WHICH SHOULD BE CONSIDERED TO BE HIGH GRADE.SEROUS PAPILLARY CARCINOMA WAS SEEN OCCURRING ADJACENT TO SOLID ENDOMETRIAL CARCINOMA WITH MARKED ANAPLASIA AND GIANT CELLS. SIZE:TUMOR MEASURED AT LEAST 4.8 CM IN GREATEST HORIZONTAL DIMENSION.  GRADE: POORLY DIFFERENTIATED OR HIGH GRADE. DEPTH OF INVASION: NO DEFINITE MYOMETRIAL INVOLVEMENT WAS SEEN.THE TUMOR APPEARED CONFINED TO THE POLYP AS WELL AS SURFACE ENDOMETRIUM. WHERE MYOMETRIAL THICKNESS NOT APPLICABLE. SEROSAL INVOLVEMENT: NOT DEMONSTRATED.  ENDOCERVICAL INVOLVEMENT:NOT DEMONSTRATED. RESECTION MARGINS: FREE OF INVOLVEMENT. EXTRAUTERINE EXTENSION:NOT DEMONSTRATED. ANGIOLYMPHATIC INVASION: NOT DEFINITELY SEEN. TOTAL NODES EXAMINED:22.  PELVIC NODES EXAMINED:20.  PELVIC NODES INVOLVED:0.  PARA-AORTIC NODES 2. EXAMINED:  PARA-AORTIC NODES 0. INVOLVED TNM STAGE: T1A N0 MX. AJCC STAGE GROUPING: IA. FIGO STAGE:IA.    08/26/2012 Imaging    No CT evidence for intra-abdominal or pelvic metastatic disease. Trace free pelvic fluid, presumably postoperative although the date of surgery is not documented in the electronic medical record.    08/26/2012 - 12/13/2012 Chemotherapy    She received 6 cycles of carbo/taxol     10/29/2012 - 11/27/2012 Radiation Therapy    May 27, June  5,  June 11, June 19, November 27, 2012: Proximal vagina 30 Gy in 5 fractions      01/17/2013 Imaging    1.  No evidence of recurrent or metastatic disease. 2.  No acute abnormality involving the abdomen or pelvis. 3.  Mild diffuse hepatic steatosis. 4.  Very small supraumbilical midline anterior abdominal wall hernia containing fat, unchanged    01/22/2014 Imaging    No evidence for metastatic or recurrent disease. 2. No bowel obstruction.  Normal appendix. 3. Small fat containing hernia, stable in appearance. 4. Status post hysterectomy and bilateral oophorectomy    02/19/2014 Imaging    No pulmonary lesions are identified. The abnormality on the chest x-ray is due to asymmetric left-sided sternoclavicular joint degenerative disease    10/12/2014 Imaging    New mild retroperitoneal lymphadenopathy in the left paraaortic region and proximal left common iliac chain, consistent with metastatic disease. No other sites of metastatic disease identified within the abdomen or pelvis.      11/27/2014 - 02/11/2015 Chemotherapy    She received 4 cycles of carbo/taxol     03/01/2015 PET scan    Single hypermetabolic small retroperitoneal lymph node along the aorta. 2. No evidence of metastatic disease otherwise in the abdomen or pelvis. No evidence local recurrence. 3. Intensely hypermetabolic enlarged nodule adjacent to the RIGHT lobe of thyroid gland. This presumably represents the biopsied lesion in clinician report which was found to be benign thyroid tissue.    04/05/2015 - 05/13/2015 Radiation Therapy    She received 50.4 gray in 28 fractions with simultaneous integrated boost to 56 gray    06/17/2015 Imaging    No acute process or evidence of metastatic disease in the abdomen or pelvis. Resolution of previously described retroperitoneal adenopathy. 2.  Possible constipation. 3. Atherosclerosis.    06/17/2015 Tumor Marker    Patient's tumor was tested for the following markers: CA125 Results of the  tumor marker test revealed 33    08/12/2015 Tumor Marker    Patient's tumor was tested for the following markers: CA125 Results of the tumor marker test revealed 52    08/31/2015 PET scan    Development of right paratracheal hypermetabolic adenopathy, consistent with nodal metastasis. 2. The previously described isolated abdominal retroperitoneal hypermetabolic node has resolved. 3. Persistent hypermetabolic right thyroid nodule, per report previously biopsied. Correlate with those results. 4.  Possible constipation.    09/09/2015 -  Anti-estrogen oral therapy    She has been receiving alternative treatment between megace and tamoxifen    09/09/2015 Tumor Marker    Patient's tumor was tested for the following markers: CA125 Results of the tumor marker test revealed 37.8    09/20/2015 Procedure    Technically successful ultrasound-guided thyroid aspiration biopsy , dominant right nodule    09/20/2015 Pathology Results    THYROID, RIGHT, FINE NEEDLE ASPIRATION (SPECIMEN 1 OF 1 COLLECTED 09/20/15): FINDINGS CONSISTENT WITH BENIGN THYROID NODULE (BETHESDA CATEGORY II).    10/28/2015 Tumor Marker    Patient's tumor was tested for the following markers: CA125 Results of the tumor marker test revealed 53.2    11/04/2015 Imaging    Stable benign right thyroid nodule    12/16/2015 Tumor Marker    Patient's tumor was tested for the following markers: CA125 Results of the tumor marker test revealed 38.1    03/22/2016 PET scan    Interval progression of hypermetabolic right paratracheal lymph node consistent with metastatic involvement. 2. Stable hypermetabolic right thyroid nodule. Reportedly this has been biopsied in the past.    03/23/2016 Tumor Marker    Patient's tumor was tested for the following markers: CA125 Results of the tumor marker test revealed 62.4    04/13/2016 - 06/01/2016 Radiation Therapy    She received 56 Gy to the chest in 28 fractions     05/09/2016 Imaging    No evidence of  left lower extremity deep vein thrombosis. No evidence of a superficial thrombosis of the greater and lesser saphenous veins. Positive for thrombus noted in several varicosities of the calf. No evidence of Baker's cyst on the left.    05/17/2016 Tumor Marker    Patient's tumor was tested for the following markers: CA125 Results of the tumor marker test revealed 9    06/29/2016 Tumor Marker    Patient's tumor was tested for the following markers: CA125 Results of the tumor marker test revealed 5.9    08/04/2016 Tumor Marker    Patient's tumor was tested for the following markers: CA125 Results of the tumor marker test revealed 6.0    09/12/2016 PET scan    Complete metabolic response to therapy, with resolution of hypermetabolic mediastinal lymphadenopathy since prior exam. No residual or new metastatic disease identified. Stable hypermetabolic right thyroid lobe nodule, which was previously biopsied on 09/20/2015.    03/13/2017 PET scan    1. Solitary focus of recurrent right paratracheal hypermetabolic activity, with a 1.0 cm right lower paratracheal node having a maximum SUV of 9.0. Appearance compatible with recurrent malignancy. 2. Continued hypermetabolic right thyroid nodule, previously biopsied, presumed benign -correlate with prior biopsy results. 3. Other imaging findings of potential clinical significance: Aortic Atherosclerosis (ICD10-I70.0). Mild cardiomegaly. Prominent stool throughout the colon favors constipation.    04/02/2017 Pathology Results    FINE NEEDLE  ASPIRATION, ENDOSCOPIC, (EBUS) 4 R NODE (SPECIMEN 1 OF 2 COLLECTED 04/02/17): MALIGNANT CELLS PRESENT, CONSISTENT WITH CARCINOMA. SEE COMMENT. COMMENT: THE MALIGNANT CELLS ARE POSITIVE FOR P53 AND NEGATIVE FOR ESTROGEN RECEPTOR AND TTF-1. THIS PROFILE IS NON-SPECIFIC, BUT THE P53 POSITIVE STAINING IS SUGGESTIVE OF GYNECOLOGIC PRIMARY.     04/11/2017 Procedure    Successful 8 French right internal jugular vein power port  placement with its tip at the SVC/RA junction    04/12/2017 Imaging    Normal LV size with mild LV hypertrophy. EF 55-60%. Normal RV size and systolic function. Aortic valve sclerosis without significant stenosis.    04/18/2017 - 06/14/2017 Chemotherapy    The patient had chemotherapy with Doxil     07/10/2017 Imaging    - Left ventricle: The cavity size was normal. There was mild concentric hypertrophy. Systolic function was normal. The estimated ejection fraction was in the range of 55% to 60%. Wall motion was normal; there were no regional wall motion abnormalities. Doppler parameters are consistent with abnormal left ventricular relaxation (grade 1 diastolic dysfunction). - Aortic valve: Noncoronary cusp mobility was mildly restricted. - Mitral valve: There was mild regurgitation. - Left atrium: The atrium was mildly dilated. - Atrial septum: No defect or patent foramen ovale was identified.  Impressions:  - Compared to November 2018, global LV longitudinal strain remains normal (has increased)    07/11/2017 PET scan    Increased size and hypermetabolic activity of 3.3 cm right thyroid lobe nodule. Thyroid carcinoma cannot be excluded. Recommend repeat ultrasound guided fine needle aspiration to exclude thyroid carcinoma.  New adjacent hypermetabolic 10 mm right supraclavicular lymph node, suspicious for lymph node metastasis.  Slight increase in size and hypermetabolic activity of solitary right paratracheal lymph node.  No evidence of abdominal or pelvic metastatic disease.    07/18/2017 Procedure    1. Technically successful ultrasound guided fine needle aspiration of indeterminate hypermetabolic right-sided thyroid nodule/mass. 2. Technically successful ultrasound-guided core needle biopsy of hypermetabolic right lower cervical lymph node.    07/18/2017 Pathology Results    THYROID, FINE NEEDLE ASPIRATION RIGHT (SPECIMEN 1 OF 1, COLLECTED ON 07/18/2017): ATYPIA OF UNDETERMINED  SIGNIFICANCE OR FOLLICULAR LESION OF UNDETERMINED SIGNIFICANCE (BETHESDA CATEGORY III). SEE COMMENT. COMMENT: THE SPECIMEN CONSISTS OF SMALL AND MEDIUM SIZED GROUPS OF FOLLICULAR EPITHELIAL CELLS WITH MILD CYTOLOGIC ATYPIA INCLUDING NUCLEAR ENLARGEMENT AND HURTHLE CELL CHANGE. SOME GROUPS ARE ARRANGED AS MICROFOLLICLES. THERE IS MINIMAL BACKGROUND COLLOID. BASED ON THESE FEATURES, A FOLLICULAR LESION/NEOPLASM CAN NOT BE ENTIRELY RULED OUT. A SPECIMEN WILL BE SENT FOR AFIRMA TESTING.    07/19/2017 Pathology Results    Lymph node, needle/core biopsy - METASTATIC PAPILLARY SEROUS CARCINOMA. - SEE COMMENT. Microscopic Comment Dr. Vicente Males has reviewed the case and concurs with this interpretation    10/15/2017 PET scan    No new or progressive disease. No evidence of abdominal or pelvic metastatic disease.  Stable hypermetabolic right thyroid lobe nodule and adjacent right supraclavicular lymph node.  Decreased size and hypermetabolic activity of solitary right paratracheal lymph node.    01/23/2018 PET scan    1. Overall, no significant change from PET-CT of 3 months ago. There is persistent hypermetabolic activity at the right thoracic inlet and within a right paratracheal mediastinal node. The nodes have not significantly changed in size, although the metabolic activity has minimally increased over this interval. It is uncertain how much of the thoracic inlet metabolic activity is attributed to the thyroid nodule versus adjacent cervical lymph nodes, both previously  biopsied. 2. No new hypermetabolic activity within the neck, chest, abdomen or pelvis. No evidence of local recurrence in the pelvis. 3. Stable probable radiation changes in the right lung.    04/16/2018 PET scan    1. Interval increase in metabolic activity of the RIGHT supraclavicular node and RIGHT lower paratracheal mediastinal node with minimal change in size. Findings consistent with persistent and mildly progressed  metastatic adenopathy. 2. New hypermetabolic small lymph node in the RIGHT lower paratracheal nodal station adjacent to the previously followed node. 3. No evidence of local recurrence in the pelvis or new disease elsewhere.     REVIEW OF SYSTEMS:   Constitutional: Denies fevers, chills or abnormal weight loss Eyes: Denies blurriness of vision Ears, nose, mouth, throat, and face: Denies mucositis or sore throat Respiratory: Denies cough, dyspnea or wheezes Cardiovascular: Denies palpitation, chest discomfort or lower extremity swelling Gastrointestinal:  Denies nausea, heartburn or change in bowel habits Skin: Denies abnormal skin rashes Lymphatics: Denies new lymphadenopathy or easy bruising Neurological:Denies numbness, tingling or new weaknesses Behavioral/Psych: Mood is stable, no new changes  All other systems were reviewed with the patient and are negative.  I have reviewed the past medical history, past surgical history, social history and family history with the patient and they are unchanged from previous note.  ALLERGIES:  has No Known Allergies.  MEDICATIONS:  Current Outpatient Medications  Medication Sig Dispense Refill  . amLODipine (NORVASC) 10 MG tablet TAKE 1 TABLET BY MOUTH DAILY. DECREASE SIMVASTATIN TO 20MG DAILY WHILE ON AMLODIPINE PER MD 90 tablet 0  . Calcium Carb-Cholecalciferol 500-600 MG-UNIT TABS Take 1 tablet by mouth daily.     . Cholecalciferol (VITAMIN D3) 2000 units TABS Take 2,000 Units by mouth daily.     . hydrochlorothiazide (HYDRODIURIL) 25 MG tablet Take 1 tablet (25 mg total) by mouth daily. 30 tablet 9  . lisinopril (PRINIVIL,ZESTRIL) 5 MG tablet Take 1 tablet (5 mg total) by mouth daily. 30 tablet 1  . LORazepam (ATIVAN) 0.5 MG tablet Take 0.5 mg by mouth 2 (two) times daily as needed.  0  . metoprolol tartrate (LOPRESSOR) 50 MG tablet TAKE 1 TABLET (50 MG TOTAL) BY MOUTH TWICE A DAY 60 tablet 1  . simvastatin (ZOCOR) 40 MG tablet Take 40 mg  by mouth every evening.    . tacrolimus (PROTOPIC) 0.1 % ointment Apply 1 application topically daily as needed (rash).     . venlafaxine XR (EFFEXOR-XR) 75 MG 24 hr capsule Take 75 mg by mouth daily with breakfast.      No current facility-administered medications for this visit.    Facility-Administered Medications Ordered in Other Visits  Medication Dose Route Frequency Provider Last Rate Last Dose  . bevacizumab (AVASTIN) 1,100 mg in sodium chloride 0.9 % 100 mL chemo infusion  14.8 mg/kg (Treatment Plan Recorded) Intravenous Once Alvy Bimler, Ni, MD      . heparin lock flush 100 unit/mL  500 Units Intracatheter Once PRN Alvy Bimler, Ni, MD      . sodium chloride flush (NS) 0.9 % injection 10 mL  10 mL Intracatheter PRN Alvy Bimler, Ni, MD        PHYSICAL EXAMINATION: ECOG PERFORMANCE STATUS: 0 - Asymptomatic  Vitals:   07/12/18 1023  BP: 107/66  Pulse: 65  Resp: 18  Temp: 97.6 F (36.4 C)  SpO2: 100%   Filed Weights   07/12/18 1023  Weight: 159 lb 9.6 oz (72.4 kg)    GENERAL:alert, no distress and comfortable SKIN: skin color,  texture, turgor are normal, no rashes or significant lesions EYES: normal, Conjunctiva are pink and non-injected, sclera clear OROPHARYNX:no exudate, no erythema and lips, buccal mucosa, and tongue normal  NECK: supple, thyroid normal size, non-tender, without nodularity LYMPH:  no palpable lymphadenopathy in the cervical, axillary or inguinal LUNGS: clear to auscultation and percussion with normal breathing effort HEART: regular rate & rhythm and no murmurs and no lower extremity edema ABDOMEN:abdomen soft, non-tender and normal bowel sounds Musculoskeletal:no cyanosis of digits and no clubbing  NEURO: alert & oriented x 3 with fluent speech, no focal motor/sensory deficits  LABORATORY DATA:  I have reviewed the data as listed    Component Value Date/Time   NA 139 07/12/2018 1000   NA 141 05/17/2017 0957   K 4.4 07/12/2018 1000   K 3.6 05/17/2017 0957    CL 102 07/12/2018 1000   CL 105 11/19/2012 0848   CO2 28 07/12/2018 1000   CO2 25 05/17/2017 0957   GLUCOSE 84 07/12/2018 1000   GLUCOSE 85 05/17/2017 0957   GLUCOSE 122 (H) 11/19/2012 0848   BUN 24 (H) 07/12/2018 1000   BUN 15.2 05/17/2017 0957   CREATININE 0.81 07/12/2018 1000   CREATININE 0.8 05/17/2017 0957   CALCIUM 9.9 07/12/2018 1000   CALCIUM 9.6 05/17/2017 0957   PROT 7.1 07/12/2018 1000   PROT 7.0 05/17/2017 0957   ALBUMIN 3.7 07/12/2018 1000   ALBUMIN 3.9 05/17/2017 0957   AST 20 07/12/2018 1000   AST 25 05/17/2017 0957   ALT 15 07/12/2018 1000   ALT 18 05/17/2017 0957   ALKPHOS 54 07/12/2018 1000   ALKPHOS 57 05/17/2017 0957   BILITOT 0.7 07/12/2018 1000   BILITOT 0.47 05/17/2017 0957   GFRNONAA >60 07/12/2018 1000   GFRAA >60 07/12/2018 1000    No results found for: SPEP, UPEP  Lab Results  Component Value Date   WBC 4.1 07/12/2018   NEUTROABS 2.8 07/12/2018   HGB 11.7 (L) 07/12/2018   HCT 35.1 (L) 07/12/2018   MCV 96.2 07/12/2018   PLT 187 07/12/2018      Chemistry      Component Value Date/Time   NA 139 07/12/2018 1000   NA 141 05/17/2017 0957   K 4.4 07/12/2018 1000   K 3.6 05/17/2017 0957   CL 102 07/12/2018 1000   CL 105 11/19/2012 0848   CO2 28 07/12/2018 1000   CO2 25 05/17/2017 0957   BUN 24 (H) 07/12/2018 1000   BUN 15.2 05/17/2017 0957   CREATININE 0.81 07/12/2018 1000   CREATININE 0.8 05/17/2017 0957   GLU 158 (H) 01/14/2015 1035      Component Value Date/Time   CALCIUM 9.9 07/12/2018 1000   CALCIUM 9.6 05/17/2017 0957   ALKPHOS 54 07/12/2018 1000   ALKPHOS 57 05/17/2017 0957   AST 20 07/12/2018 1000   AST 25 05/17/2017 0957   ALT 15 07/12/2018 1000   ALT 18 05/17/2017 0957   BILITOT 0.7 07/12/2018 1000   BILITOT 0.47 05/17/2017 0957       All questions were answered. The patient knows to call the clinic with any problems, questions or concerns. No barriers to learning was detected.  I spent 15 minutes counseling  the patient face to face. The total time spent in the appointment was 20 minutes and more than 50% was on counseling and review of test results  [name], MD 07/12/2018 11:10 AM

## 2018-07-17 MED FILL — TACROLIMUS 0.1 % OINT: 0.1 | 15 days supply | Qty: 60 | Fill #1

## 2018-07-18 MED FILL — busPIRone HCL 10 MG TABS: 10 | 30 days supply | Qty: 60 | Fill #2

## 2018-07-18 MED FILL — VENLAFAXINE HCL ER 150 MG C: 150 | 30 days supply | Qty: 60 | Fill #1

## 2018-07-23 ENCOUNTER — Telehealth: Payer: Self-pay | Admitting: Hematology and Oncology

## 2018-07-23 NOTE — Telephone Encounter (Signed)
R/s appt per 2/18 sch message - pt is aware of appt date and time

## 2018-07-26 ENCOUNTER — Other Ambulatory Visit: Payer: Self-pay | Admitting: Hematology and Oncology

## 2018-07-26 MED FILL — METOPROLOL TARTRATE 50 MG T: 50 | 30 days supply | Qty: 60 | Fill #0

## 2018-07-29 ENCOUNTER — Other Ambulatory Visit: Payer: Self-pay | Admitting: Hematology and Oncology

## 2018-07-30 ENCOUNTER — Ambulatory Visit (HOSPITAL_COMMUNITY)
Admission: RE | Admit: 2018-07-30 | Discharge: 2018-07-30 | Disposition: A | Discharge status: Home / Self Care | Payer: 59 | Source: Ambulatory Visit | Attending: Hematology and Oncology | Admitting: Hematology and Oncology

## 2018-07-30 ENCOUNTER — Ambulatory Visit: Payer: Commercial Managed Care - PPO | Admitting: Hematology and Oncology

## 2018-07-30 DIAGNOSIS — C541 Malignant neoplasm of endometrium: Secondary | ICD-10-CM | POA: Diagnosis not present

## 2018-07-30 LAB — GLUCOSE, CAPILLARY: Glucose-Capillary: 77 mg/dL (ref 70–99)

## 2018-07-30 MED ORDER — FLUDEOXYGLUCOSE F - 18 (FDG) INJECTION
7.9000 | Freq: Once | INTRAVENOUS | Status: AC | PRN
  Administered 2018-07-30: 7.9 via INTRAVENOUS

## 2018-07-31 ENCOUNTER — Inpatient Hospital Stay (HOSPITAL_BASED_OUTPATIENT_CLINIC_OR_DEPARTMENT_OTHER): Payer: 59 | Admitting: Hematology and Oncology

## 2018-07-31 ENCOUNTER — Encounter: Payer: Self-pay | Admitting: Hematology and Oncology

## 2018-07-31 ENCOUNTER — Telehealth: Payer: Self-pay

## 2018-07-31 ENCOUNTER — Telehealth: Payer: Self-pay | Admitting: Pharmacist

## 2018-07-31 ENCOUNTER — Telehealth: Payer: Self-pay | Admitting: Hematology and Oncology

## 2018-07-31 ENCOUNTER — Other Ambulatory Visit: Payer: Self-pay

## 2018-07-31 VITALS — BP 120/71 | HR 68 | Temp 98.1°F | Resp 18 | Ht 63.0 in | Wt 159.6 lb

## 2018-07-31 DIAGNOSIS — Z9221 Personal history of antineoplastic chemotherapy: Secondary | ICD-10-CM | POA: Diagnosis not present

## 2018-07-31 DIAGNOSIS — D61818 Other pancytopenia: Secondary | ICD-10-CM | POA: Diagnosis not present

## 2018-07-31 DIAGNOSIS — C541 Malignant neoplasm of endometrium: Secondary | ICD-10-CM

## 2018-07-31 DIAGNOSIS — I10 Essential (primary) hypertension: Secondary | ICD-10-CM

## 2018-07-31 DIAGNOSIS — Z7189 Other specified counseling: Secondary | ICD-10-CM

## 2018-07-31 DIAGNOSIS — Z90722 Acquired absence of ovaries, bilateral: Secondary | ICD-10-CM | POA: Diagnosis not present

## 2018-07-31 DIAGNOSIS — Z5112 Encounter for antineoplastic immunotherapy: Secondary | ICD-10-CM | POA: Diagnosis not present

## 2018-07-31 DIAGNOSIS — Z923 Personal history of irradiation: Secondary | ICD-10-CM

## 2018-07-31 DIAGNOSIS — Z90710 Acquired absence of both cervix and uterus: Secondary | ICD-10-CM

## 2018-07-31 DIAGNOSIS — R946 Abnormal results of thyroid function studies: Secondary | ICD-10-CM

## 2018-07-31 DIAGNOSIS — H269 Unspecified cataract: Secondary | ICD-10-CM

## 2018-07-31 DIAGNOSIS — Z79899 Other long term (current) drug therapy: Secondary | ICD-10-CM

## 2018-07-31 DIAGNOSIS — C786 Secondary malignant neoplasm of retroperitoneum and peritoneum: Secondary | ICD-10-CM

## 2018-07-31 MED ORDER — LENVATINIB (20 MG DAILY DOSE) 2 X 10 MG PO CPPK: 20.0000 mg | ORAL_CAPSULE | Freq: Every day | ORAL | 11 refills | Status: DC

## 2018-07-31 NOTE — Assessment & Plan Note (Signed)
She had biopsy done before that was unremarkable for thyroid cancer She is aware about risk of hypothyroidism with pembrolizumab She is also aware that lenvatinib is approved for treatment of thyroid cancer

## 2018-07-31 NOTE — Telephone Encounter (Signed)
Oral Chemotherapy Pharmacist Encounter   I spoke with in exam room patient for overview of: Lenvima (lenvatinib) for the treatment of previously treated endometrial carcinoma in conjunction with infusional Keytruda (pembrolizumab), planned duration until disease progression or unacceptable toxicity.   Counseled patient on administration, dosing, side effects, monitoring, drug-food interactions, safe handling, storage, and disposal.  Patient will take Lenvima 10mg  capsules, 2 capsules (20mg ) by mouth once daily, with or without food, at approximately the same time each day.  Lenvima start date: planned for 08/16/2018  Adverse effects include but are not limited to: hypertension, hand-foot syndrome, diarrhea, joint pain, fatigue, headache, decreased calcium, proteinuria, increased risk of blood clots, and cardiac conduction issues.   Patient will obtain anti diarrheal and alert the office of 4 or more loose stools above baseline.  Patient instructed to notify office of any upcoming invasive procedures.  Michel Santee will be held for 6 days prior to scheduled surgery, restart based on healing and clinical judgement.   Reviewed with patient importance of keeping a medication schedule and plan for any missed doses.  Ms. Vohs voiced understanding and appreciation.   All questions answered. Medication reconciliation performed and medication/allergy list updated.  Patient informed that insurance authorization will be submitted. Once approved we will have a test claim run at a local pharmacy and be able to provide copayment information. We extensively discussed medicare copayment and possibility for high copayment. We discussed options for copayment assistance including copayment grant foundations and manufacturer compassionate use program. Patient instructed that if they are enrolled into manufacturer compassionate use program that medication will be shipped directly to their home from dispensing  pharmacy of program's choosing.  We will follow-up with patient once insurance authorization is approved and copayment is known to provide further details of medication acquisition.  Patient knows to call the office with questions or concerns. Oral Oncology Clinic will continue to follow.  Johny Drilling, PharmD, BCPS, BCOP  07/31/2018   8:50 AM Oral Oncology Clinic [phone]

## 2018-07-31 NOTE — Assessment & Plan Note (Signed)
We had extensive goals of care in the past. She understood that treatment goal is palliative.

## 2018-07-31 NOTE — Progress Notes (Signed)
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

## 2018-07-31 NOTE — Telephone Encounter (Signed)
Oral Oncology Patient Advocate Encounter  Received notification from [facility] that prior authorization for Lenvima is required.  PA submitted on CoverMyMeds Key [id number] Status is pending  Oral Oncology Clinic will continue to follow.  [facility] [place] Phone [phone] Fax [phone] 07/31/2018    11:44 AM

## 2018-07-31 NOTE — Progress Notes (Signed)
[place] OFFICE PROGRESS NOTE  Patient Care Team: [name], MD as PCP - General (Family Medicine) Reynold Bowen, MD as Consulting Physician (Endocrinology)  ASSESSMENT & PLAN:  Endometrial cancer [facility]) I have reviewed PET CT scan with the patient Unfortunately, she has signs of disease progression We discussed the current guidelines I recommend her to proceed with combination chemotherapy with Lenvatinib and pembrolizumab.  The combination was approved following review conducted under [employer], an intiative of the [employer] which provides a framework for concurrent submission and review of oncology drugs among international partners. The FDA approved the combination with the [place] Therapeutic Goods Administration and [facility] [place].  Efficacy of the drugs together was investigated in Study 111/KEYNOTE-146 ([id number]), a single-arm, multicenter, open-label, multi-cohort trial that enrolled 108 patients with metastatic endometrial cancer that had progressed following at least one prior systemic therapy in any setting.  Patients took 20 mg of lenvatinib orally once daily in combination with 200 mg of pembrolizumab administered intravenously every 3 weeks until unacceptable toxicity or disease progression.  Among the 108 patients, 94 had tumors that were not MSI-H or dMMR, 11 had tumors that were MSI-H or dMMR, and in 3 patients the tumor MSI-H or dMMR status was not known.  Results  The major efficacy outcome measures were objective response rate (ORR) and duration of response (DOR) by independent radiologic review committee using RECIST 1.1. The ORR in the 94 patients whose tumors were not MSI-H or dMMR was 38.3% (95% CI: 29%, 49%) with 10 complete responses (10.6%) and 26 partial responses (27.7%). Median DOR was not reached at the time of data cutoff and 25 patients (69% of responders) had response durations ?6 months.  The most common  adverse reactions for endometrial cancer were fatigue, hypertension, musculoskeletal pain, diarrhea, decreased appetite, hypothyroidism, nausea, stomatitis, vomiting, decreased weight, abdominal pain, headache, constipation, urinary tract infection, dysphonia, hemorrhagic events, hypomagnesemia, palmar-plantar erythrodysesthesia, dyspnea, cough, and rash.  I discussed the risk, benefits, side effects of treatment with the patient and she agreed to proceed I will get insurance prior authorization My plan would be to start her treatment in 2 weeks  Cataracts, bilateral She is concerned about her vision and desire cataract surgery Hopefully, she can contact her eye specialist to get her cataract surgery done before we start her on treatment  Essential hypertension Her blood pressure control has improved with additional blood pressure medication. She will continue close monitoring of blood pressure  Goals of care, counseling/discussion We had extensive goals of care in the past. She understood that treatment goal is palliative.  Abnormal thyroid scan She had biopsy done before that was unremarkable for thyroid cancer She is aware about risk of hypothyroidism with pembrolizumab She is also aware that lenvatinib is approved for treatment of thyroid cancer   Orders Placed This Encounter  Procedures  . CBC with Differential/Platelet    Standing Status:   Standing    Number of Occurrences:   22    Standing Expiration Date:   08/01/2019  . TSH    Standing Status:   Standing    Number of Occurrences:   22    Standing Expiration Date:   08/01/2019    INTERVAL HISTORY: Please see below for problem oriented charting. She returns for further follow-up and review test result Since last time I saw her, she feels fine Denies hypertension She complained of blurriness of vision and would like to have cataract surgery done before chemotherapy  SUMMARY OF ONCOLOGIC HISTORY: Oncology History    [employer] testing done 11-2015 on surgical path from 2014: MS stable TMB low 4 muts/mb ATM [id number] ERBB3 T389K E2H2 rearrangement exon 9 PPP2R1A P179R TP53 [id number]    ER- APPROXIMATELY 25-35% STAINING IN NEOPLASTIC CELLS (INTERMEDIATE)  PR- APPROXIMATELY 25-35% STAINING IN NEOPLASTIC CELLS (STRONG)  Repeat biopsy 04/02/17: ER negative, Her 2 negative  Progressed on Doxil     Endometrial cancer ([place])   06/20/2012 Pathology Results    Biopsy positive for papillary serous carcinoma    06/20/2012 Genetic Testing    [employer] testing done 11-2015 on surgical path from 2014: MS stable TMB low 4 muts/mb ATM [id number] ERBB3 T389K E2H2 rearrangement exon 9 PPP2R1A P179R TP53 I195N     06/20/2012 Initial Diagnosis    Patient presented to PCP with intermittent vaginal bleeding since ~ Oct 2013, endometrial biopsy 06-20-12 with complex endometrial hyperplasia with atypia    06/26/2012 Imaging    Thickened endometrial lining in a postmenopausal patient experiencing vaginal bleeding. In the setting of post-menopausal bleeding, endometrial sampling is indicated to exclude carcinoma. No focal myometrial abnormalities are seen.  Normal left ovary and non-visualized right ovary    07/30/2012 Surgery    Dr. Polly Cobia performed robotic hysterectomy with bilateral salpingo-oophorectomy, bilateral pelvic lymph node dissection and periaortic lymph node dissection. Intraoperatively on frozen section, the patient was noted to have a large endometrial polyp with changes within the polyp consistent for high-grade malignancy, possibly papillary serous carcinoma. There is no obvious extrauterine disease noted.      07/30/2012 Pathology Results    [phone] SUPPLEMENTAL REPORT  THE ENDOMETRIAL CARCINOMA WAS ANALYZED FOR DNA MISMATCH REPAIR PROTEINS.IMMUNOHISTOCHEMICALLY, THE NEOPLASM RETAINED NUCLEAR EXPRESSION OF 4 GENE PRODUCTS, MLH1, MSH2, MSH6, AND PMS2, INVOLVED  IN DNA MISMATCH REPAIR.POSITIVE AND NEGATIVE CONTROLS WORKED APPROPRIATELY.  PER REQUEST, AN ER AND PR ARE PERFORMED ON BLOCK 1I.  ER- APPROXIMATELY 25-35% STAINING IN NEOPLASTIC CELLS (INTERMEDIATE)  PR- APPROXIMATELY 25-35% STAINING IN NEOPLASTIC CELLS (STRONG)  ER AND PR PREDOMINANTLY SHOW STAINING IN THE SEROUS COMPONENT.  DIAGNOSIS  1. UTERUS, CERVIX WITH BILATERAL FALLOPIAN TUBES AND OVARIES,  HYSTERECTOMY AND BILATERAL SALPINGO-OOPHORECTOMY:  HIGH GRADE/POORLY DIFFERENTIATED ENDOMETRIAL ADENOCARCINOMA WITH SOLID AND SEROUS PAPILLARY COMPONENTS.  HISTOPATHOLOGIC TYPE:A VARIETY OF PATTERNS WERE PRESENT, ALL OF WHICH SHOULD BE CONSIDERED TO BE HIGH GRADE.SEROUS PAPILLARY CARCINOMA WAS SEEN OCCURRING ADJACENT TO SOLID ENDOMETRIAL CARCINOMA WITH MARKED ANAPLASIA AND GIANT CELLS. SIZE:TUMOR MEASURED AT LEAST 4.8 CM IN GREATEST HORIZONTAL DIMENSION.  GRADE: POORLY DIFFERENTIATED OR HIGH GRADE. DEPTH OF INVASION: NO DEFINITE MYOMETRIAL INVOLVEMENT WAS SEEN.THE TUMOR APPEARED CONFINED TO THE POLYP AS WELL AS SURFACE ENDOMETRIUM. WHERE MYOMETRIAL THICKNESS NOT APPLICABLE. SEROSAL INVOLVEMENT: NOT DEMONSTRATED.  ENDOCERVICAL INVOLVEMENT:NOT DEMONSTRATED. RESECTION MARGINS: FREE OF INVOLVEMENT. EXTRAUTERINE EXTENSION:NOT DEMONSTRATED. ANGIOLYMPHATIC INVASION: NOT DEFINITELY SEEN. TOTAL NODES EXAMINED:22.  PELVIC NODES EXAMINED:20.  PELVIC NODES INVOLVED:0.  PARA-AORTIC NODES 2. EXAMINED:  PARA-AORTIC NODES 0. INVOLVED TNM STAGE: T1A N0 MX. AJCC STAGE GROUPING: IA. FIGO STAGE:IA.    08/26/2012 Imaging    No CT evidence for intra-abdominal or pelvic metastatic disease. Trace free pelvic fluid, presumably postoperative although the date of surgery is not documented in the electronic medical record.    08/26/2012 - 12/13/2012 Chemotherapy    She received 6 cycles of carbo/taxol     10/29/2012 -  11/27/2012 Radiation Therapy    May 27, June 5,  June 11, June 19, November 27, 2012: Proximal vagina 30 Gy in 5 fractions  01/17/2013 Imaging    1.  No evidence of recurrent or metastatic disease. 2.  No acute abnormality involving the abdomen or pelvis. 3.  Mild diffuse hepatic steatosis. 4.  Very small supraumbilical midline anterior abdominal wall hernia containing fat, unchanged    01/22/2014 Imaging    No evidence for metastatic or recurrent disease. 2. No bowel obstruction.  Normal appendix. 3. Small fat containing hernia, stable in appearance. 4. Status post hysterectomy and bilateral oophorectomy    02/19/2014 Imaging    No pulmonary lesions are identified. The abnormality on the chest x-ray is due to asymmetric left-sided sternoclavicular joint degenerative disease    10/12/2014 Imaging    New mild retroperitoneal lymphadenopathy in the left paraaortic region and proximal left common iliac chain, consistent with metastatic disease. No other sites of metastatic disease identified within the abdomen or pelvis.      11/27/2014 - 02/11/2015 Chemotherapy    She received 4 cycles of carbo/taxol     03/01/2015 PET scan    Single hypermetabolic small retroperitoneal lymph node along the aorta. 2. No evidence of metastatic disease otherwise in the abdomen or pelvis. No evidence local recurrence. 3. Intensely hypermetabolic enlarged nodule adjacent to the RIGHT lobe of thyroid gland. This presumably represents the biopsied lesion in clinician report which was found to be benign thyroid tissue.    04/05/2015 - 05/13/2015 Radiation Therapy    She received 50.4 gray in 28 fractions with simultaneous integrated boost to 56 gray    06/17/2015 Imaging    No acute process or evidence of metastatic disease in the abdomen or pelvis. Resolution of previously described retroperitoneal adenopathy. 2.  Possible constipation. 3. Atherosclerosis.    06/17/2015 Tumor Marker    Patient's tumor was tested for  the following markers: CA125 Results of the tumor marker test revealed 33    08/12/2015 Tumor Marker    Patient's tumor was tested for the following markers: CA125 Results of the tumor marker test revealed 52    08/31/2015 PET scan    Development of right paratracheal hypermetabolic adenopathy, consistent with nodal metastasis. 2. The previously described isolated abdominal retroperitoneal hypermetabolic node has resolved. 3. Persistent hypermetabolic right thyroid nodule, per report previously biopsied. Correlate with those results. 4.  Possible constipation.    09/09/2015 -  Anti-estrogen oral therapy    She has been receiving alternative treatment between megace and tamoxifen    09/09/2015 Tumor Marker    Patient's tumor was tested for the following markers: CA125 Results of the tumor marker test revealed 37.8    09/20/2015 Procedure    Technically successful ultrasound-guided thyroid aspiration biopsy , dominant right nodule    09/20/2015 Pathology Results    THYROID, RIGHT, FINE NEEDLE ASPIRATION (SPECIMEN 1 OF 1 COLLECTED 09/20/15): FINDINGS CONSISTENT WITH BENIGN THYROID NODULE (BETHESDA CATEGORY II).    10/28/2015 Tumor Marker    Patient's tumor was tested for the following markers: CA125 Results of the tumor marker test revealed 53.2    11/04/2015 Imaging    Stable benign right thyroid nodule    12/16/2015 Tumor Marker    Patient's tumor was tested for the following markers: CA125 Results of the tumor marker test revealed 38.1    03/22/2016 PET scan    Interval progression of hypermetabolic right paratracheal lymph node consistent with metastatic involvement. 2. Stable hypermetabolic right thyroid nodule. Reportedly this has been biopsied in the past.    03/23/2016 Tumor Marker    Patient's tumor was tested for the following  markers: CA125 Results of the tumor marker test revealed 62.4    04/13/2016 - 06/01/2016 Radiation Therapy    She received 56 Gy to the chest in 28  fractions     05/09/2016 Imaging    No evidence of left lower extremity deep vein thrombosis. No evidence of a superficial thrombosis of the greater and lesser saphenous veins. Positive for thrombus noted in several varicosities of the calf. No evidence of Baker's cyst on the left.    05/17/2016 Tumor Marker    Patient's tumor was tested for the following markers: CA125 Results of the tumor marker test revealed 9    06/29/2016 Tumor Marker    Patient's tumor was tested for the following markers: CA125 Results of the tumor marker test revealed 5.9    08/04/2016 Tumor Marker    Patient's tumor was tested for the following markers: CA125 Results of the tumor marker test revealed 6.0    09/12/2016 PET scan    Complete metabolic response to therapy, with resolution of hypermetabolic mediastinal lymphadenopathy since prior exam. No residual or new metastatic disease identified. Stable hypermetabolic right thyroid lobe nodule, which was previously biopsied on 09/20/2015.    03/13/2017 PET scan    1. Solitary focus of recurrent right paratracheal hypermetabolic activity, with a 1.0 cm right lower paratracheal node having a maximum SUV of 9.0. Appearance compatible with recurrent malignancy. 2. Continued hypermetabolic right thyroid nodule, previously biopsied, presumed benign -correlate with prior biopsy results. 3. Other imaging findings of potential clinical significance: Aortic Atherosclerosis (ICD10-I70.0). Mild cardiomegaly. Prominent stool throughout the colon favors constipation.    04/02/2017 Pathology Results    FINE NEEDLE ASPIRATION, ENDOSCOPIC, (EBUS) 4 R NODE (SPECIMEN 1 OF 2 COLLECTED 04/02/17): MALIGNANT CELLS PRESENT, CONSISTENT WITH CARCINOMA. SEE COMMENT. COMMENT: THE MALIGNANT CELLS ARE POSITIVE FOR P53 AND NEGATIVE FOR ESTROGEN RECEPTOR AND TTF-1. THIS PROFILE IS NON-SPECIFIC, BUT THE P53 POSITIVE STAINING IS SUGGESTIVE OF GYNECOLOGIC PRIMARY.     04/11/2017 Procedure     Successful 8 French right internal jugular vein power port placement with its tip at the SVC/RA junction    04/12/2017 Imaging    Normal LV size with mild LV hypertrophy. EF 55-60%. Normal RV size and systolic function. Aortic valve sclerosis without significant stenosis.    04/18/2017 - 06/14/2017 Chemotherapy    The patient had chemotherapy with Doxil     07/10/2017 Imaging    - Left ventricle: The cavity size was normal. There was mild concentric hypertrophy. Systolic function was normal. The estimated ejection fraction was in the range of 55% to 60%. Wall motion was normal; there were no regional wall motion abnormalities. Doppler parameters are consistent with abnormal left ventricular relaxation (grade 1 diastolic dysfunction). - Aortic valve: Noncoronary cusp mobility was mildly restricted. - Mitral valve: There was mild regurgitation. - Left atrium: The atrium was mildly dilated. - Atrial septum: No defect or patent foramen ovale was identified.  Impressions:  - Compared to November 2018, global LV longitudinal strain remains normal (has increased)    07/11/2017 PET scan    Increased size and hypermetabolic activity of 3.3 cm right thyroid lobe nodule. Thyroid carcinoma cannot be excluded. Recommend repeat ultrasound guided fine needle aspiration to exclude thyroid carcinoma.  New adjacent hypermetabolic 10 mm right supraclavicular lymph node, suspicious for lymph node metastasis.  Slight increase in size and hypermetabolic activity of solitary right paratracheal lymph node.  No evidence of abdominal or pelvic metastatic disease.    07/18/2017 Procedure  1. Technically successful ultrasound guided fine needle aspiration of indeterminate hypermetabolic right-sided thyroid nodule/mass. 2. Technically successful ultrasound-guided core needle biopsy of hypermetabolic right lower cervical lymph node.    07/18/2017 Pathology Results    THYROID, FINE NEEDLE ASPIRATION RIGHT (SPECIMEN 1  OF 1, COLLECTED ON 07/18/2017): ATYPIA OF UNDETERMINED SIGNIFICANCE OR FOLLICULAR LESION OF UNDETERMINED SIGNIFICANCE (BETHESDA CATEGORY III). SEE COMMENT. COMMENT: THE SPECIMEN CONSISTS OF SMALL AND MEDIUM SIZED GROUPS OF FOLLICULAR EPITHELIAL CELLS WITH MILD CYTOLOGIC ATYPIA INCLUDING NUCLEAR ENLARGEMENT AND HURTHLE CELL CHANGE. SOME GROUPS ARE ARRANGED AS MICROFOLLICLES. THERE IS MINIMAL BACKGROUND COLLOID. BASED ON THESE FEATURES, A FOLLICULAR LESION/NEOPLASM CAN NOT BE ENTIRELY RULED OUT. A SPECIMEN WILL BE SENT FOR AFIRMA TESTING.    07/19/2017 Pathology Results    Lymph node, needle/core biopsy - METASTATIC PAPILLARY SEROUS CARCINOMA. - SEE COMMENT. Microscopic Comment Dr. Vicente Males has reviewed the case and concurs with this interpretation    10/15/2017 PET scan    No new or progressive disease. No evidence of abdominal or pelvic metastatic disease.  Stable hypermetabolic right thyroid lobe nodule and adjacent right supraclavicular lymph node.  Decreased size and hypermetabolic activity of solitary right paratracheal lymph node.    01/23/2018 PET scan    1. Overall, no significant change from PET-CT of 3 months ago. There is persistent hypermetabolic activity at the right thoracic inlet and within a right paratracheal mediastinal node. The nodes have not significantly changed in size, although the metabolic activity has minimally increased over this interval. It is uncertain how much of the thoracic inlet metabolic activity is attributed to the thyroid nodule versus adjacent cervical lymph nodes, both previously biopsied. 2. No new hypermetabolic activity within the neck, chest, abdomen or pelvis. No evidence of local recurrence in the pelvis. 3. Stable probable radiation changes in the right lung.    04/16/2018 PET scan    1. Interval increase in metabolic activity of the RIGHT supraclavicular node and RIGHT lower paratracheal mediastinal node with minimal change in size. Findings  consistent with persistent and mildly progressed metastatic adenopathy. 2. New hypermetabolic small lymph node in the RIGHT lower paratracheal nodal station adjacent to the previously followed node. 3. No evidence of local recurrence in the pelvis or new disease elsewhere.    07/30/2018 PET scan    1. Mild interval progression of right supraclavicular, mediastinal, and right hilar hypermetabolic metastatic disease. There is a new hypermetabolic lymph node in the subcarinal station on today's study. 2. No hypermetabolic disease in the abdomen or pelvis to suggest recurrent/metastases. 3.  Aortic Atherosclerois (ICD10-170.0)     08/16/2018 -  Chemotherapy    The patient had pembrolizumab (KEYTRUDA) 200 mg in sodium chloride 0.9 % 50 mL chemo infusion, 200 mg, Intravenous, Once, 0 of 6 cycles  for chemotherapy treatment.      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      REVIEW OF SYSTEMS:   Constitutional: Denies fevers, chills or abnormal weight loss Ears, nose, mouth, throat, and face: Denies mucositis or sore throat Respiratory: Denies cough, dyspnea or wheezes Cardiovascular: Denies palpitation, chest discomfort or lower extremity swelling Gastrointestinal:  Denies nausea, heartburn or change in bowel habits Skin: Denies abnormal skin  rashes Lymphatics: Denies new lymphadenopathy or easy bruising Neurological:Denies numbness, tingling or new weaknesses Behavioral/Psych: Mood is stable, no new changes  All other systems were reviewed with the patient and are negative.  I have reviewed the past medical history, past surgical history, social history and family history with the patient and they are  unchanged from previous note.  ALLERGIES:  has No Known Allergies.  MEDICATIONS:  Current Outpatient Medications  Medication Sig Dispense Refill  . amLODipine (NORVASC) 10 MG tablet TAKE 1 TABLET BY MOUTH DAILY. DECREASE SIMVASTATIN TO 20MG DAILY WHILE ON AMLODIPINE PER MD 90 tablet 0  . Calcium Carb-Cholecalciferol 500-600 MG-UNIT TABS Take 1 tablet by mouth daily.     . Cholecalciferol (VITAMIN D3) 2000 units TABS Take 2,000 Units by mouth daily.     . hydrochlorothiazide (HYDRODIURIL) 25 MG tablet Take 1 tablet (25 mg total) by mouth daily. 30 tablet 9  . lisinopril (PRINIVIL,ZESTRIL) 5 MG tablet TAKE 1 TABLET BY MOUTH DAILY. 30 tablet 1  . LORazepam (ATIVAN) 0.5 MG tablet Take 0.5 mg by mouth 2 (two) times daily as needed.  0  . metoprolol tartrate (LOPRESSOR) 50 MG tablet TAKE 1 TABLET BY MOUTH TWICE A DAY 60 tablet 11  . simvastatin (ZOCOR) 40 MG tablet Take 40 mg by mouth every evening.    . tacrolimus (PROTOPIC) 0.1 % ointment Apply 1 application topically daily as needed (rash).     . venlafaxine XR (EFFEXOR-XR) 75 MG 24 hr capsule Take 75 mg by mouth daily with breakfast.      No current facility-administered medications for this visit.     PHYSICAL EXAMINATION: ECOG PERFORMANCE STATUS: 0 - Asymptomatic  Vitals:   07/31/18 0819  BP: 120/71  Pulse: 68  Resp: 18  Temp: 98.1 F (36.7 C)  SpO2: 100%   Filed Weights   07/31/18 0819  Weight: 159 lb 9.6 oz (72.4 kg)    GENERAL:alert, no distress and comfortable Musculoskeletal:no cyanosis of digits and no clubbing  NEURO: alert & oriented x 3 with fluent speech, no focal motor/sensory deficits  LABORATORY DATA:  I have reviewed the data as listed    Component Value Date/Time   NA 139 07/12/2018 1000   NA 141 05/17/2017 0957   K 4.4 07/12/2018 1000   K 3.6 05/17/2017 0957   CL 102 07/12/2018 1000   CL 105 11/19/2012 0848   CO2 28 07/12/2018 1000   CO2 25 05/17/2017 0957   GLUCOSE 84 07/12/2018 1000   GLUCOSE  85 05/17/2017 0957   GLUCOSE 122 (H) 11/19/2012 0848   BUN 24 (H) 07/12/2018 1000   BUN 15.2 05/17/2017 0957   CREATININE 0.81 07/12/2018 1000   CREATININE 0.8 05/17/2017 0957   CALCIUM 9.9 07/12/2018 1000   CALCIUM 9.6 05/17/2017 0957   PROT 7.1 07/12/2018 1000   PROT 7.0 05/17/2017 0957   ALBUMIN 3.7 07/12/2018 1000   ALBUMIN 3.9 05/17/2017 0957   AST 20 07/12/2018 1000   AST 25 05/17/2017 0957   ALT 15 07/12/2018 1000   ALT 18 05/17/2017 0957   ALKPHOS 54 07/12/2018 1000   ALKPHOS 57 05/17/2017 0957   BILITOT 0.7 07/12/2018 1000   BILITOT 0.47 05/17/2017 0957   GFRNONAA >60 07/12/2018 1000   GFRAA >60 07/12/2018 1000    No results found for: SPEP, UPEP  Lab Results  Component Value Date   WBC 4.1 07/12/2018   NEUTROABS 2.8 07/12/2018   HGB 11.7 (L) 07/12/2018   HCT 35.1 (L) 07/12/2018  MCV 96.2 07/12/2018   PLT 187 07/12/2018      Chemistry      Component Value Date/Time   NA 139 07/12/2018 1000   NA 141 05/17/2017 0957   K 4.4 07/12/2018 1000   K 3.6 05/17/2017 0957   CL 102 07/12/2018 1000   CL 105 11/19/2012 0848   CO2 28 07/12/2018 1000   CO2 25 05/17/2017 0957   BUN 24 (H) 07/12/2018 1000   BUN 15.2 05/17/2017 0957   CREATININE 0.81 07/12/2018 1000   CREATININE 0.8 05/17/2017 0957   GLU 158 (H) 01/14/2015 1035      Component Value Date/Time   CALCIUM 9.9 07/12/2018 1000   CALCIUM 9.6 05/17/2017 0957   ALKPHOS 54 07/12/2018 1000   ALKPHOS 57 05/17/2017 0957   AST 20 07/12/2018 1000   AST 25 05/17/2017 0957   ALT 15 07/12/2018 1000   ALT 18 05/17/2017 0957   BILITOT 0.7 07/12/2018 1000   BILITOT 0.47 05/17/2017 0957       RADIOGRAPHIC STUDIES: I have personally reviewed the radiological images as listed and agreed with the findings in the report. Nm Pet Image Restag (ps) Skull Base To Thigh  Result Date: 07/30/2018 CLINICAL DATA:  Subsequent treatment strategy for endometrial cancer. EXAM: NUCLEAR MEDICINE PET SKULL BASE TO THIGH  TECHNIQUE: 7.9 mCi F-18 FDG was injected intravenously. Full-ring PET imaging was performed from the skull base to thigh after the radiotracer. CT data was obtained and used for attenuation correction and anatomic localization. Fasting blood glucose: 77 mg/dl COMPARISON:  04/16/2018 FINDINGS: Mediastinal blood pool activity: SUV max 2.2 NECK: No hypermetabolic lymph nodes in the neck. Incidental CT findings: The hypermetabolic right thyroid gland is again identified, similar to prior. Immediately adjacent hypermetabolic lymph node in the right thoracic inlet has progressed, measuring 14 mm today compared to 9 mm previously. SUV max = 11.0 compared to 7.3 previously. CHEST: Previously seen hypermetabolic lower right paratracheal index node is similar to prior with SUV max = 10.8 today compared to 11.1 previously. Lymph node size is similar at 11 mm short axis. The right hilar hypermetabolic disease has progressed with SUV max = 18.3 today compared to 6.8 previously. 7 mm short axis subcarinal lymph node has become hypermetabolic in the interval with SUV max = 5.5 Incidental CT findings: Right Port-A-Cath tip is positioned in the mid right [facility]. Medial right upper lobe scarring is similar to prior. 3 mm right upper lobe nodule (25/8) is more prominent than on the prior study. No focal airspace consolidation. No pleural effusion. ABDOMEN/PELVIS: No abnormal hypermetabolic activity within the liver, pancreas, adrenal glands, or spleen. No hypermetabolic lymph nodes in the abdomen or pelvis. Incidental CT findings: There is abdominal aortic atherosclerosis without aneurysm. SKELETON: No osseous FDG accumulation to raise concern for bony metastatic disease. Uptake in the paraspinal musculature of the upper thoracic region and in the posterior left shoulder musculature likely related to patient motion after radiopharmaceutical injection. Incidental CT findings: none IMPRESSION: 1. Mild interval progression of right  supraclavicular, mediastinal, and right hilar hypermetabolic metastatic disease. There is a new hypermetabolic lymph node in the subcarinal station on today's study. 2. No hypermetabolic disease in the abdomen or pelvis to suggest recurrent/metastases. 3.  Aortic Atherosclerois (ICD10-170.0) Electronically Signed   By: Misty Stanley M.D.   On: 07/30/2018 13:16    All questions were answered. The patient knows to call the clinic with any problems, questions or concerns. No barriers to learning was detected.  I spent 30 minutes counseling the patient face to face. The total time spent in the appointment was 40 minutes and more than 50% was on counseling and review of test results  [name], MD 07/31/2018 8:49 AM

## 2018-07-31 NOTE — Assessment & Plan Note (Addendum)
She is concerned about her vision and desire cataract surgery Hopefully, she can contact her eye specialist to get her cataract surgery done before we start her on treatment

## 2018-07-31 NOTE — Telephone Encounter (Signed)
Gave avs and calendar ° °

## 2018-07-31 NOTE — Assessment & Plan Note (Addendum)
I have reviewed PET CT scan with the patient Unfortunately, she has signs of disease progression We discussed the current guidelines I recommend her to proceed with combination chemotherapy with Lenvatinib and pembrolizumab.  The combination was approved following review conducted under [employer], an intiative of the [employer] which provides a framework for concurrent submission and review of oncology drugs among international partners. The FDA approved the combination with the [place] Therapeutic Goods Administration and [facility] [place].  Efficacy of the drugs together was investigated in Study 111/KEYNOTE-146 ([id number]), a single-arm, multicenter, open-label, multi-cohort trial that enrolled 108 patients with metastatic endometrial cancer that had progressed following at least one prior systemic therapy in any setting.  Patients took 20 mg of lenvatinib orally once daily in combination with 200 mg of pembrolizumab administered intravenously every 3 weeks until unacceptable toxicity or disease progression.  Among the 108 patients, 94 had tumors that were not MSI-H or dMMR, 11 had tumors that were MSI-H or dMMR, and in 3 patients the tumor MSI-H or dMMR status was not known.  Results  The major efficacy outcome measures were objective response rate (ORR) and duration of response (DOR) by independent radiologic review committee using RECIST 1.1. The ORR in the 94 patients whose tumors were not MSI-H or dMMR was 38.3% (95% CI: 29%, 49%) with 10 complete responses (10.6%) and 26 partial responses (27.7%). Median DOR was not reached at the time of data cutoff and 25 patients (69% of responders) had response durations ?6 months.  The most common adverse reactions for endometrial cancer were fatigue, hypertension, musculoskeletal pain, diarrhea, decreased appetite, hypothyroidism, nausea, stomatitis, vomiting, decreased weight, abdominal pain, headache, constipation, urinary  tract infection, dysphonia, hemorrhagic events, hypomagnesemia, palmar-plantar erythrodysesthesia, dyspnea, cough, and rash.  I discussed the risk, benefits, side effects of treatment with the patient and she agreed to proceed I will get insurance prior authorization My plan would be to start her treatment in 2 weeks

## 2018-07-31 NOTE — Telephone Encounter (Signed)
Oral Oncology Pharmacist Encounter  Received new prescription for Lenvima (lenvatinib) for the treatment of previously treated endometrial carcinoma in conjunction with infusional Keytruda (pembrolizumab), planned duration until disease progression or unacceptable toxicity.  Original diagnosis in January 2014 Robotic hysterectomy with bilateral salpingo-oophorectomy was performed on 07/30/2012 Patient then received carboplatin and paclitaxel x6 cycles March-July 2014, followed by radiation therapy in May-June 2014 Recurrence was noted on imaging in May 2016 and patient was subsequently treated with carboplatin and paclitaxel x4 cycles in June-September 2016 Patient received additional radiation therapy in October-December 2016 Recurrence was again noted in March 2017 and patient was started on antiestrogen oral therapy in April 2017 Additional radiation therapy was given in November-December 2017 to the thoracic area Patient experienced another recurrence in November 2018 and was treated with Doxil from November 2018-January 2019 without response  Patient is again noted with recurrence and progression of disease seen on PET scan on 07/30/2018 and is under evaluation to start fourth line treatment with Lenvima and Keytruda.  Labs from 07/12/2018 assessed, [facility] for treatment. 07/12/2018 urine protein trace, will continue to be monitored BPs reviewed, most readings WNL, will continue to be monitored TSH has been ordered and will be monitored periodically during treatment No cardiac history noted  Current medication list in Epic reviewed, no DDIs with Lenvima identified.  Prescription has been e-scribed to the [facility] for benefits analysis and approval.  Oral Oncology Clinic will continue to follow for insurance authorization, copayment issues, initial counseling and start date.  Johny Drilling, PharmD, BCPS, BCOP  07/31/2018 8:42 AM Oral Oncology Clinic [phone]

## 2018-07-31 NOTE — Telephone Encounter (Signed)
Oral Oncology Patient Advocate Encounter  Prior Authorization for Diane Lozano has been approved.    PA# [id number] Effective dates: 07/31/18 through 07/31/19  Oral Oncology Clinic will continue to follow.   [place] Patient [place] Phone [phone] Fax [phone] 07/31/2018    3:59 PM

## 2018-07-31 NOTE — Assessment & Plan Note (Signed)
Her blood pressure control has improved with additional blood pressure medication. She will continue close monitoring of blood pressure

## 2018-08-01 ENCOUNTER — Encounter: Payer: Self-pay | Admitting: Hematology and Oncology

## 2018-08-01 MED FILL — LISINOPRIL 5 MG TABLET: 5 | 30 days supply | Qty: 30 | Fill #0

## 2018-08-02 ENCOUNTER — Other Ambulatory Visit: Payer: 59

## 2018-08-02 ENCOUNTER — Ambulatory Visit: Payer: 59

## 2018-08-05 MED FILL — LENVIMA 20 MG DAILY DOSE: 2 X 10 | 30 days supply | Qty: 60 | Fill #0

## 2018-08-06 ENCOUNTER — Encounter: Payer: Self-pay | Admitting: Hematology and Oncology

## 2018-08-06 NOTE — Telephone Encounter (Signed)
Oral Oncology Patient Advocate Encounter  Confirmed with [place] that Diane Lozano was picked up on 08/05/18 with a $0 copay.   [place] Patient [place] Phone [phone] Fax [phone] 08/06/2018   9:14 AM

## 2018-08-08 ENCOUNTER — Telehealth: Payer: Self-pay

## 2018-08-08 NOTE — Telephone Encounter (Signed)
-----   Message from [name], MD sent at 08/08/2018  7:49 AM EST ----- Regarding: start Nathanial Rancher,  If she still cannot get her cataract surgery, she can start her Lenvima tomorrow Her infusion we keep at next week

## 2018-08-08 NOTE — Telephone Encounter (Signed)
Called and left below message. Ask her to call the office back. 

## 2018-08-08 NOTE — Telephone Encounter (Signed)
Called back and given below message. Instructed to take Lenvima at the same time every day with or without food. She verbalized understanding.  She wants to take about on her appt on 3/13. Would it be okay to have laser cataract surgery while she is taking treatment.

## 2018-08-08 NOTE — Telephone Encounter (Signed)
It's not recommended due to potential poor wound healing

## 2018-08-08 NOTE — Telephone Encounter (Signed)
Called and given below message. She verbalized understanding. 

## 2018-08-12 ENCOUNTER — Other Ambulatory Visit: Payer: Self-pay | Admitting: Hematology and Oncology

## 2018-08-14 ENCOUNTER — Encounter: Payer: Self-pay | Admitting: Hematology and Oncology

## 2018-08-14 NOTE — Progress Notes (Signed)
FMLA successfully faxed to [employer] at [phone]. Mailed copy to patient address on file. 

## 2018-08-15 ENCOUNTER — Telehealth: Payer: Self-pay

## 2018-08-15 NOTE — Telephone Encounter (Signed)
Returned phone call to pt regarding s/e's Pt states she woke up 2 nights ago with a red sore area near her gum line that was very painful.  She has been using salt water rinses and taking Ibuprofen which seem to help but sore spot has not improved in appearance, also not any worse.  Pt states that she has also been feeling very run down and tired since starting the lenvatinib - she had to come home from work early yesterday d/t to this.   Pt has appt with Dr Alvy Bimler tomorrow.   I instructed pt to make Dr Alvy Bimler aware of all side effects she is having at visit tomorrow.  Pt verbalizes understanding.

## 2018-08-16 ENCOUNTER — Inpatient Hospital Stay: Payer: 59 | Attending: Hematology and Oncology

## 2018-08-16 ENCOUNTER — Inpatient Hospital Stay: Payer: 59

## 2018-08-16 ENCOUNTER — Other Ambulatory Visit: Payer: Self-pay

## 2018-08-16 ENCOUNTER — Inpatient Hospital Stay (HOSPITAL_BASED_OUTPATIENT_CLINIC_OR_DEPARTMENT_OTHER): Payer: 59 | Admitting: Hematology and Oncology

## 2018-08-16 ENCOUNTER — Encounter: Payer: Self-pay | Admitting: Hematology and Oncology

## 2018-08-16 DIAGNOSIS — K1231 Oral mucositis (ulcerative) due to antineoplastic therapy: Secondary | ICD-10-CM

## 2018-08-16 DIAGNOSIS — Z95828 Presence of other vascular implants and grafts: Secondary | ICD-10-CM

## 2018-08-16 DIAGNOSIS — E041 Nontoxic single thyroid nodule: Secondary | ICD-10-CM | POA: Insufficient documentation

## 2018-08-16 DIAGNOSIS — C541 Malignant neoplasm of endometrium: Secondary | ICD-10-CM

## 2018-08-16 DIAGNOSIS — E039 Hypothyroidism, unspecified: Secondary | ICD-10-CM

## 2018-08-16 DIAGNOSIS — L271 Localized skin eruption due to drugs and medicaments taken internally: Secondary | ICD-10-CM | POA: Insufficient documentation

## 2018-08-16 DIAGNOSIS — J040 Acute laryngitis: Secondary | ICD-10-CM | POA: Diagnosis not present

## 2018-08-16 DIAGNOSIS — C770 Secondary and unspecified malignant neoplasm of lymph nodes of head, face and neck: Secondary | ICD-10-CM | POA: Insufficient documentation

## 2018-08-16 DIAGNOSIS — Z9221 Personal history of antineoplastic chemotherapy: Secondary | ICD-10-CM

## 2018-08-16 DIAGNOSIS — Z923 Personal history of irradiation: Secondary | ICD-10-CM | POA: Diagnosis not present

## 2018-08-16 DIAGNOSIS — Z79899 Other long term (current) drug therapy: Secondary | ICD-10-CM | POA: Diagnosis not present

## 2018-08-16 DIAGNOSIS — I10 Essential (primary) hypertension: Secondary | ICD-10-CM | POA: Insufficient documentation

## 2018-08-16 DIAGNOSIS — Z5112 Encounter for antineoplastic immunotherapy: Secondary | ICD-10-CM | POA: Diagnosis not present

## 2018-08-16 DIAGNOSIS — Z7189 Other specified counseling: Secondary | ICD-10-CM

## 2018-08-16 LAB — CBC WITH DIFFERENTIAL/PLATELET
Abs Immature Granulocytes: 0.01 10*3/uL (ref 0.00–0.07)
Basophils Absolute: 0.1 10*3/uL (ref 0.0–0.1)
Basophils Relative: 1 %
Eosinophils Absolute: 0.2 10*3/uL (ref 0.0–0.5)
Eosinophils Relative: 3 %
HCT: 38.5 % (ref 36.0–46.0)
Hemoglobin: 13 g/dL (ref 12.0–15.0)
Immature Granulocytes: 0 %
Lymphocytes Relative: 11 %
Lymphs Abs: 0.8 10*3/uL (ref 0.7–4.0)
MCH: 31.8 pg (ref 26.0–34.0)
MCHC: 33.8 g/dL (ref 30.0–36.0)
MCV: 94.1 fL (ref 80.0–100.0)
Monocytes Absolute: 0.5 10*3/uL (ref 0.1–1.0)
Monocytes Relative: 7 %
Neutro Abs: 5.2 10*3/uL (ref 1.7–7.7)
Neutrophils Relative %: 78 %
Platelets: 203 10*3/uL (ref 150–400)
RBC: 4.09 MIL/uL (ref 3.87–5.11)
RDW: 12.3 % (ref 11.5–15.5)
WBC: 6.7 10*3/uL (ref 4.0–10.5)
nRBC: 0 % (ref 0.0–0.2)

## 2018-08-16 LAB — COMPREHENSIVE METABOLIC PANEL
ALT: 13 U/L (ref 0–44)
AST: 19 U/L (ref 15–41)
Albumin: 3.3 g/dL — ABNORMAL LOW (ref 3.5–5.0)
Alkaline Phosphatase: 66 U/L (ref 38–126)
Anion gap: 12 (ref 5–15)
BUN: 26 mg/dL — ABNORMAL HIGH (ref 8–23)
CO2: 25 mmol/L (ref 22–32)
Calcium: 9.4 mg/dL (ref 8.9–10.3)
Chloride: 100 mmol/L (ref 98–111)
Creatinine, Ser: 0.96 mg/dL (ref 0.44–1.00)
GFR calc Af Amer: >60 mL/min (ref >60)
GFR calc non Af Amer: >60 mL/min (ref >60)
Glucose, Bld: 113 mg/dL — ABNORMAL HIGH (ref 70–99)
Potassium: 4 mmol/L (ref 3.5–5.1)
Sodium: 137 mmol/L (ref 135–145)
Total Bilirubin: 0.6 mg/dL (ref 0.3–1.2)
Total Protein: 7.4 g/dL (ref 6.5–8.1)

## 2018-08-16 LAB — TOTAL PROTEIN, URINE DIPSTICK: Protein, ur: 300 mg/dL — AB

## 2018-08-16 LAB — TSH: TSH: 4.593 u[IU]/mL — ABNORMAL HIGH (ref 0.308–3.960)

## 2018-08-16 MED ORDER — HEPARIN SOD (PORK) LOCK FLUSH 100 UNIT/ML IV SOLN
500.0000 [IU] | Freq: Once | INTRAVENOUS | Status: AC | PRN
  Administered 2018-08-16: 500 [IU]
  Filled 2018-08-16: qty 5

## 2018-08-16 MED ORDER — SODIUM CHLORIDE 0.9% FLUSH
10.0000 mL | Freq: Once | INTRAVENOUS | Status: AC
  Administered 2018-08-16: 10 mL
  Filled 2018-08-16: qty 10

## 2018-08-16 MED ORDER — SODIUM CHLORIDE 0.9 % IV SOLN
200.0000 mg | Freq: Once | INTRAVENOUS | Status: AC
  Administered 2018-08-16: 200 mg via INTRAVENOUS
  Filled 2018-08-16: qty 8

## 2018-08-16 MED ORDER — LEVOTHYROXINE SODIUM 25 MCG PO TABS: 25.0000 ug | ORAL_TABLET | Freq: Every day | ORAL | 3 refills | Status: DC

## 2018-08-16 MED ORDER — SODIUM CHLORIDE 0.9% FLUSH
10.0000 mL | INTRAVENOUS | Status: DC | PRN
  Administered 2018-08-16: 10 mL
  Filled 2018-08-16: qty 10

## 2018-08-16 MED ORDER — SODIUM CHLORIDE 0.9 % IV SOLN
Freq: Once | INTRAVENOUS | Status: AC
  Administered 2018-08-16: 12:00:00 via INTRAVENOUS
  Filled 2018-08-16: qty 250

## 2018-08-16 MED FILL — VENLAFAXINE HCL ER 150 MG C: 150 | 30 days supply | Qty: 60 | Fill #2

## 2018-08-16 MED FILL — LEVOTHYROXINE 25 MCG TABLET: 25 | 30 days supply | Qty: 30 | Fill #0 | Status: TO

## 2018-08-16 MED FILL — busPIRone HCL 10 MG TABS: 10 | 30 days supply | Qty: 60 | Fill #0 | Status: TO

## 2018-08-16 NOTE — Telephone Encounter (Signed)
Spoke with pt by phone to discuss TSH result and need for medication.  Prescription has been sent to [facility] per pt request.

## 2018-08-16 NOTE — Assessment & Plan Note (Signed)
This is due to side effects of treatment I will start her on low-dose levothyroxine

## 2018-08-16 NOTE — Assessment & Plan Note (Signed)
She has heavy proteinuria due to [facility] She will hold her treatment We will proceed with pembrolizumab I plan to see her back within 12 days from today to reassess In the meantime, she will continue aggressive blood pressure monitoring at home

## 2018-08-16 NOTE — Progress Notes (Signed)
[place] OFFICE PROGRESS NOTE  Patient Care Team: [name], MD as PCP - General (Family Medicine) Reynold Bowen, MD as Consulting Physician (Endocrinology)  ASSESSMENT & PLAN:  Endometrial cancer [facility]) She has heavy proteinuria due to [facility] She will hold her treatment We will proceed with pembrolizumab I plan to see her back within 12 days from today to reassess In the meantime, she will continue aggressive blood pressure monitoring at home  Essential hypertension Her blood pressure control is satisfactory but yet she has heavy proteinuria likely due to [facility] She will continue her blood pressure medicine and will report to me in her next visit her blood pressure control at home  Hand foot syndrome She has symptoms of pain in the palms and feet I recommend her to hold Lenvima for now We will continue conservative approach with moisturizing cream  Mucositis due to chemotherapy This is due to [facility] She has no signs of oral bleeding or severe ulceration prohibiting oral intake Hopefully, by taking a chemo break, this will heal We will use conservative management with baking soda with salt water gargle only  Acquired hypothyroidism This is due to side effects of treatment I will start her on low-dose levothyroxine   No orders of the defined types were placed in this encounter.   INTERVAL HISTORY: Please see below for problem oriented charting. She returns for Keytruda infusion She started oral chemotherapy last week She started to experience mouth sore, pain in her hands and feet She denies swallowing difficulties No nausea or changes in bowel habits She complained of excessive fatigue  SUMMARY OF ONCOLOGIC HISTORY: Oncology History   [employer] testing done 11-2015 on surgical path from 2014: MS stable TMB low 4 muts/mb ATM [id number] ERBB3 T389K E2H2 rearrangement exon 9 PPP2R1A P179R TP53 [id number]    ER- APPROXIMATELY  25-35% STAINING IN NEOPLASTIC CELLS (INTERMEDIATE)  PR- APPROXIMATELY 25-35% STAINING IN NEOPLASTIC CELLS (STRONG)  Repeat biopsy 04/02/17: ER negative, Her 2 negative  Progressed on Doxil     Endometrial cancer ([place])   06/20/2012 Pathology Results    Biopsy positive for papillary serous carcinoma    06/20/2012 Genetic Testing    [employer] testing done 11-2015 on surgical path from 2014: MS stable TMB low 4 muts/mb ATM [id number] ERBB3 T389K E2H2 rearrangement exon 9 PPP2R1A P179R TP53 [id number]     06/20/2012 Initial Diagnosis    Patient presented to PCP with intermittent vaginal bleeding since ~ Oct 2013, endometrial biopsy 06-20-12 with complex endometrial hyperplasia with atypia    06/26/2012 Imaging    Thickened endometrial lining in a postmenopausal patient experiencing vaginal bleeding. In the setting of post-menopausal bleeding, endometrial sampling is indicated to exclude carcinoma. No focal myometrial abnormalities are seen.  Normal left ovary and non-visualized right ovary    07/30/2012 Surgery    Dr. Polly Cobia performed robotic hysterectomy with bilateral salpingo-oophorectomy, bilateral pelvic lymph node dissection and periaortic lymph node dissection. Intraoperatively on frozen section, the patient was noted to have a large endometrial polyp with changes within the polyp consistent for high-grade malignancy, possibly papillary serous carcinoma. There is no obvious extrauterine disease noted.      07/30/2012 Pathology Results    [phone] SUPPLEMENTAL REPORT  THE ENDOMETRIAL CARCINOMA WAS ANALYZED FOR DNA MISMATCH REPAIR PROTEINS.IMMUNOHISTOCHEMICALLY, THE NEOPLASM RETAINED NUCLEAR EXPRESSION OF 4 GENE PRODUCTS, MLH1, MSH2, MSH6, AND PMS2, INVOLVED IN DNA MISMATCH REPAIR.POSITIVE AND NEGATIVE CONTROLS WORKED APPROPRIATELY.  PER REQUEST, AN ER AND PR ARE PERFORMED ON BLOCK 1I.  ER- APPROXIMATELY 25-35% STAINING IN NEOPLASTIC CELLS (INTERMEDIATE)  PR-  APPROXIMATELY 25-35% STAINING IN NEOPLASTIC CELLS (STRONG)  ER AND PR PREDOMINANTLY SHOW STAINING IN THE SEROUS COMPONENT.  DIAGNOSIS  1. UTERUS, CERVIX WITH BILATERAL FALLOPIAN TUBES AND OVARIES,  HYSTERECTOMY AND BILATERAL SALPINGO-OOPHORECTOMY:  HIGH GRADE/POORLY DIFFERENTIATED ENDOMETRIAL ADENOCARCINOMA WITH SOLID AND SEROUS PAPILLARY COMPONENTS.  HISTOPATHOLOGIC TYPE:A VARIETY OF PATTERNS WERE PRESENT, ALL OF WHICH SHOULD BE CONSIDERED TO BE HIGH GRADE.SEROUS PAPILLARY CARCINOMA WAS SEEN OCCURRING ADJACENT TO SOLID ENDOMETRIAL CARCINOMA WITH MARKED ANAPLASIA AND GIANT CELLS. SIZE:TUMOR MEASURED AT LEAST 4.8 CM IN GREATEST HORIZONTAL DIMENSION.  GRADE: POORLY DIFFERENTIATED OR HIGH GRADE. DEPTH OF INVASION: NO DEFINITE MYOMETRIAL INVOLVEMENT WAS SEEN.THE TUMOR APPEARED CONFINED TO THE POLYP AS WELL AS SURFACE ENDOMETRIUM. WHERE MYOMETRIAL THICKNESS NOT APPLICABLE. SEROSAL INVOLVEMENT: NOT DEMONSTRATED.  ENDOCERVICAL INVOLVEMENT:NOT DEMONSTRATED. RESECTION MARGINS: FREE OF INVOLVEMENT. EXTRAUTERINE EXTENSION:NOT DEMONSTRATED. ANGIOLYMPHATIC INVASION: NOT DEFINITELY SEEN. TOTAL NODES EXAMINED:22.  PELVIC NODES EXAMINED:20.  PELVIC NODES INVOLVED:0.  PARA-AORTIC NODES 2. EXAMINED:  PARA-AORTIC NODES 0. INVOLVED TNM STAGE: T1A N0 MX. AJCC STAGE GROUPING: IA. FIGO STAGE:IA.    08/26/2012 Imaging    No CT evidence for intra-abdominal or pelvic metastatic disease. Trace free pelvic fluid, presumably postoperative although the date of surgery is not documented in the electronic medical record.    08/26/2012 - 12/13/2012 Chemotherapy    She received 6 cycles of carbo/taxol     10/29/2012 - 11/27/2012 Radiation Therapy    May 27, June 5,  June 11, June 19, November 27, 2012: Proximal vagina 30 Gy in 5 fractions      01/17/2013 Imaging    1.  No evidence of recurrent or metastatic disease. 2.  No  acute abnormality involving the abdomen or pelvis. 3.  Mild diffuse hepatic steatosis. 4.  Very small supraumbilical midline anterior abdominal wall hernia containing fat, unchanged    01/22/2014 Imaging    No evidence for metastatic or recurrent disease. 2. No bowel obstruction.  Normal appendix. 3. Small fat containing hernia, stable in appearance. 4. Status post hysterectomy and bilateral oophorectomy    02/19/2014 Imaging    No pulmonary lesions are identified. The abnormality on the chest x-ray is due to asymmetric left-sided sternoclavicular joint degenerative disease    10/12/2014 Imaging    New mild retroperitoneal lymphadenopathy in the left paraaortic region and proximal left common iliac chain, consistent with metastatic disease. No other sites of metastatic disease identified within the abdomen or pelvis.      11/27/2014 - 02/11/2015 Chemotherapy    She received 4 cycles of carbo/taxol     03/01/2015 PET scan    Single hypermetabolic small retroperitoneal lymph node along the aorta. 2. No evidence of metastatic disease otherwise in the abdomen or pelvis. No evidence local recurrence. 3. Intensely hypermetabolic enlarged nodule adjacent to the RIGHT lobe of thyroid gland. This presumably represents the biopsied lesion in clinician report which was found to be benign thyroid tissue.    04/05/2015 - 05/13/2015 Radiation Therapy    She received 50.4 gray in 28 fractions with simultaneous integrated boost to 56 gray    06/17/2015 Imaging    No acute process or evidence of metastatic disease in the abdomen or pelvis. Resolution of previously described retroperitoneal adenopathy. 2.  Possible constipation. 3. Atherosclerosis.    06/17/2015 Tumor Marker    Patient's tumor was tested for the following markers: CA125 Results of the tumor marker test revealed 33    08/12/2015 Tumor Marker    Patient's  tumor was tested for the following markers: CA125 Results of the tumor marker test revealed  52    08/31/2015 PET scan    Development of right paratracheal hypermetabolic adenopathy, consistent with nodal metastasis. 2. The previously described isolated abdominal retroperitoneal hypermetabolic node has resolved. 3. Persistent hypermetabolic right thyroid nodule, per report previously biopsied. Correlate with those results. 4.  Possible constipation.    09/09/2015 -  Anti-estrogen oral therapy    She has been receiving alternative treatment between megace and tamoxifen    09/09/2015 Tumor Marker    Patient's tumor was tested for the following markers: CA125 Results of the tumor marker test revealed 37.8    09/20/2015 Procedure    Technically successful ultrasound-guided thyroid aspiration biopsy , dominant right nodule    09/20/2015 Pathology Results    THYROID, RIGHT, FINE NEEDLE ASPIRATION (SPECIMEN 1 OF 1 COLLECTED 09/20/15): FINDINGS CONSISTENT WITH BENIGN THYROID NODULE (BETHESDA CATEGORY II).    10/28/2015 Tumor Marker    Patient's tumor was tested for the following markers: CA125 Results of the tumor marker test revealed 53.2    11/04/2015 Imaging    Stable benign right thyroid nodule    12/16/2015 Tumor Marker    Patient's tumor was tested for the following markers: CA125 Results of the tumor marker test revealed 38.1    03/22/2016 PET scan    Interval progression of hypermetabolic right paratracheal lymph node consistent with metastatic involvement. 2. Stable hypermetabolic right thyroid nodule. Reportedly this has been biopsied in the past.    03/23/2016 Tumor Marker    Patient's tumor was tested for the following markers: CA125 Results of the tumor marker test revealed 62.4    04/13/2016 - 06/01/2016 Radiation Therapy    She received 56 Gy to the chest in 28 fractions     05/09/2016 Imaging    No evidence of left lower extremity deep vein thrombosis. No evidence of a superficial thrombosis of the greater and lesser saphenous veins. Positive for thrombus noted in several  varicosities of the calf. No evidence of Baker's cyst on the left.    05/17/2016 Tumor Marker    Patient's tumor was tested for the following markers: CA125 Results of the tumor marker test revealed 9    06/29/2016 Tumor Marker    Patient's tumor was tested for the following markers: CA125 Results of the tumor marker test revealed 5.9    08/04/2016 Tumor Marker    Patient's tumor was tested for the following markers: CA125 Results of the tumor marker test revealed 6.0    09/12/2016 PET scan    Complete metabolic response to therapy, with resolution of hypermetabolic mediastinal lymphadenopathy since prior exam. No residual or new metastatic disease identified. Stable hypermetabolic right thyroid lobe nodule, which was previously biopsied on 09/20/2015.    03/13/2017 PET scan    1. Solitary focus of recurrent right paratracheal hypermetabolic activity, with a 1.0 cm right lower paratracheal node having a maximum SUV of 9.0. Appearance compatible with recurrent malignancy. 2. Continued hypermetabolic right thyroid nodule, previously biopsied, presumed benign -correlate with prior biopsy results. 3. Other imaging findings of potential clinical significance: Aortic Atherosclerosis (ICD10-I70.0). Mild cardiomegaly. Prominent stool throughout the colon favors constipation.    04/02/2017 Pathology Results    FINE NEEDLE ASPIRATION, ENDOSCOPIC, (EBUS) 4 R NODE (SPECIMEN 1 OF 2 COLLECTED 04/02/17): MALIGNANT CELLS PRESENT, CONSISTENT WITH CARCINOMA. SEE COMMENT. COMMENT: THE MALIGNANT CELLS ARE POSITIVE FOR P53 AND NEGATIVE FOR ESTROGEN RECEPTOR AND TTF-1. THIS PROFILE IS NON-SPECIFIC, BUT  THE P53 POSITIVE STAINING IS SUGGESTIVE OF GYNECOLOGIC PRIMARY.     04/11/2017 Procedure    Successful 8 French right internal jugular vein power port placement with its tip at the SVC/RA junction    04/12/2017 Imaging    Normal LV size with mild LV hypertrophy. EF 55-60%. Normal RV size and systolic function.  Aortic valve sclerosis without significant stenosis.    04/18/2017 - 06/14/2017 Chemotherapy    The patient had chemotherapy with Doxil     07/10/2017 Imaging    - Left ventricle: The cavity size was normal. There was mild concentric hypertrophy. Systolic function was normal. The estimated ejection fraction was in the range of 55% to 60%. Wall motion was normal; there were no regional wall motion abnormalities. Doppler parameters are consistent with abnormal left ventricular relaxation (grade 1 diastolic dysfunction). - Aortic valve: Noncoronary cusp mobility was mildly restricted. - Mitral valve: There was mild regurgitation. - Left atrium: The atrium was mildly dilated. - Atrial septum: No defect or patent foramen ovale was identified.  Impressions:  - Compared to November 2018, global LV longitudinal strain remains normal (has increased)    07/11/2017 PET scan    Increased size and hypermetabolic activity of 3.3 cm right thyroid lobe nodule. Thyroid carcinoma cannot be excluded. Recommend repeat ultrasound guided fine needle aspiration to exclude thyroid carcinoma.  New adjacent hypermetabolic 10 mm right supraclavicular lymph node, suspicious for lymph node metastasis.  Slight increase in size and hypermetabolic activity of solitary right paratracheal lymph node.  No evidence of abdominal or pelvic metastatic disease.    07/18/2017 Procedure    1. Technically successful ultrasound guided fine needle aspiration of indeterminate hypermetabolic right-sided thyroid nodule/mass. 2. Technically successful ultrasound-guided core needle biopsy of hypermetabolic right lower cervical lymph node.    07/18/2017 Pathology Results    THYROID, FINE NEEDLE ASPIRATION RIGHT (SPECIMEN 1 OF 1, COLLECTED ON 07/18/2017): ATYPIA OF UNDETERMINED SIGNIFICANCE OR FOLLICULAR LESION OF UNDETERMINED SIGNIFICANCE (BETHESDA CATEGORY III). SEE COMMENT. COMMENT: THE SPECIMEN CONSISTS OF SMALL AND MEDIUM SIZED GROUPS  OF FOLLICULAR EPITHELIAL CELLS WITH MILD CYTOLOGIC ATYPIA INCLUDING NUCLEAR ENLARGEMENT AND HURTHLE CELL CHANGE. SOME GROUPS ARE ARRANGED AS MICROFOLLICLES. THERE IS MINIMAL BACKGROUND COLLOID. BASED ON THESE FEATURES, A FOLLICULAR LESION/NEOPLASM CAN NOT BE ENTIRELY RULED OUT. A SPECIMEN WILL BE SENT FOR AFIRMA TESTING.    07/19/2017 Pathology Results    Lymph node, needle/core biopsy - METASTATIC PAPILLARY SEROUS CARCINOMA. - SEE COMMENT. Microscopic Comment Dr. Vicente Males has reviewed the case and concurs with this interpretation    10/15/2017 PET scan    No new or progressive disease. No evidence of abdominal or pelvic metastatic disease.  Stable hypermetabolic right thyroid lobe nodule and adjacent right supraclavicular lymph node.  Decreased size and hypermetabolic activity of solitary right paratracheal lymph node.    01/23/2018 PET scan    1. Overall, no significant change from PET-CT of 3 months ago. There is persistent hypermetabolic activity at the right thoracic inlet and within a right paratracheal mediastinal node. The nodes have not significantly changed in size, although the metabolic activity has minimally increased over this interval. It is uncertain how much of the thoracic inlet metabolic activity is attributed to the thyroid nodule versus adjacent cervical lymph nodes, both previously biopsied. 2. No new hypermetabolic activity within the neck, chest, abdomen or pelvis. No evidence of local recurrence in the pelvis. 3. Stable probable radiation changes in the right lung.    04/16/2018 PET scan    1.  Interval increase in metabolic activity of the RIGHT supraclavicular node and RIGHT lower paratracheal mediastinal node with minimal change in size. Findings consistent with persistent and mildly progressed metastatic adenopathy. 2. New hypermetabolic small lymph node in the RIGHT lower paratracheal nodal station adjacent to the previously followed node. 3. No evidence of local  recurrence in the pelvis or new disease elsewhere.    07/30/2018 PET scan    1. Mild interval progression of right supraclavicular, mediastinal, and right hilar hypermetabolic metastatic disease. There is a new hypermetabolic lymph node in the subcarinal station on today's study. 2. No hypermetabolic disease in the abdomen or pelvis to suggest recurrent/metastases. 3.  Aortic Atherosclerois (ICD10-170.0)     08/09/2018 -  Chemotherapy    The patient had Lenvima since 3/6 and pembrolizumab since 3/13 for chemotherapy treatment.      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      REVIEW OF SYSTEMS:   Constitutional: Denies fevers, chills or abnormal weight loss Eyes: Denies blurriness of vision Respiratory: Denies cough, dyspnea or wheezes Cardiovascular: Denies palpitation, chest discomfort or lower extremity swelling Gastrointestinal:  Denies nausea, heartburn or change in bowel habits Lymphatics: Denies new lymphadenopathy or easy bruising Neurological:Denies numbness, tingling or new weaknesses Behavioral/Psych: Mood is stable, no new changes  All other systems were reviewed with the patient and are negative.  I have reviewed the past medical history, past surgical history, social history and family history with the patient and they are unchanged from previous note.  ALLERGIES:  has No Known Allergies.  MEDICATIONS:  Current Outpatient Medications  Medication Sig Dispense Refill  . amLODipine (NORVASC) 10 MG tablet TAKE 1 TABLET BY MOUTH DAILY. DECREASE SIMVASTATIN TO 20MG DAILY WHILE ON AMLODIPINE PER MD 90 tablet 0  . Calcium Carb-Cholecalciferol 500-600 MG-UNIT  TABS Take 1 tablet by mouth daily.     . Cholecalciferol (VITAMIN D3) 2000 units TABS Take 2,000 Units by mouth daily.     . hydrochlorothiazide (HYDRODIURIL) 25 MG tablet Take 1 tablet (25 mg total) by mouth daily. 30 tablet 9  . lenvatinib 20 mg daily dose (LENVIMA, 20 MG DAILY DOSE,) 2 x 10 MG capsule Take 2 capsules (20 mg total) by mouth daily. 60 capsule 11  . levothyroxine (SYNTHROID, LEVOTHROID) 25 MCG tablet Take 1 tablet (25 mcg total) by mouth daily before breakfast. 30 tablet 3  . lisinopril (PRINIVIL,ZESTRIL) 5 MG tablet TAKE 1 TABLET BY MOUTH DAILY. 30 tablet 1  . LORazepam (ATIVAN) 0.5 MG tablet Take 0.5 mg by mouth 2 (two) times daily as needed.  0  . metoprolol tartrate (LOPRESSOR) 50 MG tablet TAKE 1 TABLET BY MOUTH TWICE A DAY 60 tablet 11  . simvastatin (ZOCOR) 40 MG tablet Take 40 mg by mouth every evening.    . tacrolimus (PROTOPIC) 0.1 % ointment Apply 1 application topically daily as needed (rash).     . venlafaxine XR (EFFEXOR-XR) 75 MG 24 hr capsule Take 75 mg by mouth daily with breakfast.      No current facility-administered medications for this visit.     PHYSICAL EXAMINATION: ECOG PERFORMANCE STATUS: 2 - Symptomatic, <50% confined to bed  Vitals:  08/16/18 1100  BP: (!) 125/55  Pulse: 70  Resp: 18  Temp: 98 F (36.7 C)  SpO2: 98%   Filed Weights   08/16/18 1100  Weight: 156 lb 6.4 oz (70.9 kg)    GENERAL:alert, no distress and comfortable SKIN: skin color, texture, turgor are normal, no rashes or significant lesions EYES: normal, Conjunctiva are pink and non-injected, sclera clear OROPHARYNX:no exudate, no erythema and lips, buccal mucosa, and tongue normal.  Noted swelling on the lower lip NECK: supple, thyroid normal size, non-tender, without nodularity LYMPH:  no palpable lymphadenopathy in the cervical, axillary or inguinal LUNGS: clear to auscultation and percussion with normal breathing effort HEART: regular rate & rhythm and no murmurs and  no lower extremity edema ABDOMEN:abdomen soft, non-tender and normal bowel sounds Musculoskeletal:no cyanosis of digits and no clubbing  NEURO: alert & oriented x 3 with fluent speech, no focal motor/sensory deficits  LABORATORY DATA:  I have reviewed the data as listed    Component Value Date/Time   NA 137 08/16/2018 1008   NA 141 05/17/2017 0957   K 4.0 08/16/2018 1008   K 3.6 05/17/2017 0957   CL 100 08/16/2018 1008   CL 105 11/19/2012 0848   CO2 25 08/16/2018 1008   CO2 25 05/17/2017 0957   GLUCOSE 113 (H) 08/16/2018 1008   GLUCOSE 85 05/17/2017 0957   GLUCOSE 122 (H) 11/19/2012 0848   BUN 26 (H) 08/16/2018 1008   BUN 15.2 05/17/2017 0957   CREATININE 0.96 08/16/2018 1008   CREATININE 0.8 05/17/2017 0957   CALCIUM 9.4 08/16/2018 1008   CALCIUM 9.6 05/17/2017 0957   PROT 7.4 08/16/2018 1008   PROT 7.0 05/17/2017 0957   ALBUMIN 3.3 (L) 08/16/2018 1008   ALBUMIN 3.9 05/17/2017 0957   AST 19 08/16/2018 1008   AST 25 05/17/2017 0957   ALT 13 08/16/2018 1008   ALT 18 05/17/2017 0957   ALKPHOS 66 08/16/2018 1008   ALKPHOS 57 05/17/2017 0957   BILITOT 0.6 08/16/2018 1008   BILITOT 0.47 05/17/2017 0957   GFRNONAA >60 08/16/2018 1008   GFRAA >60 08/16/2018 1008    No results found for: SPEP, UPEP  Lab Results  Component Value Date   WBC 6.7 08/16/2018   NEUTROABS 5.2 08/16/2018   HGB 13.0 08/16/2018   HCT 38.5 08/16/2018   MCV 94.1 08/16/2018   PLT 203 08/16/2018      Chemistry      Component Value Date/Time   NA 137 08/16/2018 1008   NA 141 05/17/2017 0957   K 4.0 08/16/2018 1008   K 3.6 05/17/2017 0957   CL 100 08/16/2018 1008   CL 105 11/19/2012 0848   CO2 25 08/16/2018 1008   CO2 25 05/17/2017 0957   BUN 26 (H) 08/16/2018 1008   BUN 15.2 05/17/2017 0957   CREATININE 0.96 08/16/2018 1008   CREATININE 0.8 05/17/2017 0957   GLU 158 (H) 01/14/2015 1035      Component Value Date/Time   CALCIUM 9.4 08/16/2018 1008   CALCIUM 9.6 05/17/2017 0957    ALKPHOS 66 08/16/2018 1008   ALKPHOS 57 05/17/2017 0957   AST 19 08/16/2018 1008   AST 25 05/17/2017 0957   ALT 13 08/16/2018 1008   ALT 18 05/17/2017 0957   BILITOT 0.6 08/16/2018 1008   BILITOT 0.47 05/17/2017 0957       RADIOGRAPHIC STUDIES: I have personally reviewed the radiological images as listed and agreed with the findings in the report. Nm Pet Image Restag (  ps) Skull Base To Thigh  Result Date: 07/30/2018 CLINICAL DATA:  Subsequent treatment strategy for endometrial cancer. EXAM: NUCLEAR MEDICINE PET SKULL BASE TO THIGH TECHNIQUE: 7.9 mCi F-18 FDG was injected intravenously. Full-ring PET imaging was performed from the skull base to thigh after the radiotracer. CT data was obtained and used for attenuation correction and anatomic localization. Fasting blood glucose: 77 mg/dl COMPARISON:  04/16/2018 FINDINGS: Mediastinal blood pool activity: SUV max 2.2 NECK: No hypermetabolic lymph nodes in the neck. Incidental CT findings: The hypermetabolic right thyroid gland is again identified, similar to prior. Immediately adjacent hypermetabolic lymph node in the right thoracic inlet has progressed, measuring 14 mm today compared to 9 mm previously. SUV max = 11.0 compared to 7.3 previously. CHEST: Previously seen hypermetabolic lower right paratracheal index node is similar to prior with SUV max = 10.8 today compared to 11.1 previously. Lymph node size is similar at 11 mm short axis. The right hilar hypermetabolic disease has progressed with SUV max = 18.3 today compared to 6.8 previously. 7 mm short axis subcarinal lymph node has become hypermetabolic in the interval with SUV max = 5.5 Incidental CT findings: Right Port-A-Cath tip is positioned in the mid right atrium. Medial right upper lobe scarring is similar to prior. 3 mm right upper lobe nodule (25/8) is more prominent than on the prior study. No focal airspace consolidation. No pleural effusion. ABDOMEN/PELVIS: No abnormal hypermetabolic  activity within the liver, pancreas, adrenal glands, or spleen. No hypermetabolic lymph nodes in the abdomen or pelvis. Incidental CT findings: There is abdominal aortic atherosclerosis without aneurysm. SKELETON: No osseous FDG accumulation to raise concern for bony metastatic disease. Uptake in the paraspinal musculature of the upper thoracic region and in the posterior left shoulder musculature likely related to patient motion after radiopharmaceutical injection. Incidental CT findings: none IMPRESSION: 1. Mild interval progression of right supraclavicular, mediastinal, and right hilar hypermetabolic metastatic disease. There is a new hypermetabolic lymph node in the subcarinal station on today's study. 2. No hypermetabolic disease in the abdomen or pelvis to suggest recurrent/metastases. 3.  Aortic Atherosclerois (ICD10-170.0) Electronically Signed   By: Misty Stanley M.D.   On: 07/30/2018 13:16    All questions were answered. The patient knows to call the clinic with any problems, questions or concerns. No barriers to learning was detected.  I spent 30 minutes counseling the patient face to face. The total time spent in the appointment was 40 minutes and more than 50% was on counseling and review of test results  [name], MD 08/16/2018 5:00 PM

## 2018-08-16 NOTE — Assessment & Plan Note (Signed)
She has symptoms of pain in the palms and feet I recommend her to hold Lenvima for now We will continue conservative approach with moisturizing cream

## 2018-08-16 NOTE — Patient Instructions (Signed)
Pembrolizumab injection  What is this medicine?  PEMBROLIZUMAB (pem broe liz ue mab) is a monoclonal antibody. It is used to treat cervical cancer, esophageal cancer, head and neck cancer, hepatocellular cancer, Hodgkin lymphoma, kidney cancer, lymphoma, melanoma, Merkel cell carcinoma, lung cancer, stomach cancer, urothelial cancer, and cancers that have a certain genetic condition.  This medicine may be used for other purposes; ask your health care provider or pharmacist if you have questions.  COMMON BRAND NAME(S): Keytruda  What should I tell my health care provider before I take this medicine?  They need to know if you have any of these conditions:  -diabetes  -immune system problems  -inflammatory bowel disease  -liver disease  -lung or breathing disease  -lupus  -received or scheduled to receive an organ transplant or a stem-cell transplant that uses donor stem cells  -an unusual or allergic reaction to pembrolizumab, other medicines, foods, dyes, or preservatives  -pregnant or trying to get pregnant  -breast-feeding  How should I use this medicine?  This medicine is for infusion into a vein. It is given by a health care professional in a hospital or clinic setting.  A special MedGuide will be given to you before each treatment. Be sure to read this information carefully each time.  Talk to your pediatrician regarding the use of this medicine in children. While this drug may be prescribed for selected conditions, precautions do apply.  Overdosage: If you think you have taken too much of this medicine contact a poison control center or emergency room at once.  NOTE: This medicine is only for you. Do not share this medicine with others.  What if I miss a dose?  It is important not to miss your dose. Call your doctor or health care professional if you are unable to keep an appointment.  What may interact with this medicine?  Interactions have not been studied.  Give your health care provider a list of all the  medicines, herbs, non-prescription drugs, or dietary supplements you use. Also tell them if you smoke, drink alcohol, or use illegal drugs. Some items may interact with your medicine.  This list may not describe all possible interactions. Give your health care provider a list of all the medicines, herbs, non-prescription drugs, or dietary supplements you use. Also tell them if you smoke, drink alcohol, or use illegal drugs. Some items may interact with your medicine.  What should I watch for while using this medicine?  Your condition will be monitored carefully while you are receiving this medicine.  You may need blood work done while you are taking this medicine.  Do not become pregnant while taking this medicine or for 4 months after stopping it. Women should inform their doctor if they wish to become pregnant or think they might be pregnant. There is a potential for serious side effects to an unborn child. Talk to your health care professional or pharmacist for more information. Do not breast-feed an infant while taking this medicine or for 4 months after the last dose.  What side effects may I notice from receiving this medicine?  Side effects that you should report to your doctor or health care professional as soon as possible:  -allergic reactions like skin rash, itching or hives, swelling of the face, lips, or tongue  -bloody or black, tarry  -breathing problems  -changes in vision  -chest pain  -chills  -confusion  -constipation  -cough  -diarrhea  -dizziness or feeling faint or lightheaded  -  fast or irregular heartbeat  -fever  -flushing  -hair loss  -joint pain  -low blood counts - this medicine may decrease the number of white blood cells, red blood cells and platelets. You may be at increased risk for infections and bleeding.  -muscle pain  -muscle weakness  -persistent headache  -redness, blistering, peeling or loosening of the skin, including inside the mouth  -signs and symptoms of high blood sugar  such as dizziness; dry mouth; dry skin; fruity breath; nausea; stomach pain; increased hunger or thirst; increased urination  -signs and symptoms of kidney injury like trouble passing urine or change in the amount of urine  -signs and symptoms of liver injury like dark urine, light-colored stools, loss of appetite, nausea, right upper belly pain, yellowing of the eyes or skin  -sweating  -swollen lymph nodes  -weight loss  Side effects that usually do not require medical attention (report to your doctor or health care professional if they continue or are bothersome):  -decreased appetite  -muscle pain  -tiredness  This list may not describe all possible side effects. Call your doctor for medical advice about side effects. You may report side effects to FDA at 1-800-FDA-1088.  Where should I keep my medicine?  This drug is given in a hospital or clinic and will not be stored at home.  NOTE: This sheet is a summary. It may not cover all possible information. If you have questions about this medicine, talk to your doctor, pharmacist, or health care provider.   2019 [employer] (2018-01-03 15:06:10)

## 2018-08-16 NOTE — Assessment & Plan Note (Signed)
This is due to [facility] She has no signs of oral bleeding or severe ulceration prohibiting oral intake Hopefully, by taking a chemo break, this will heal We will use conservative management with baking soda with salt water gargle only

## 2018-08-16 NOTE — Assessment & Plan Note (Signed)
Her blood pressure control is satisfactory but yet she has heavy proteinuria likely due to [facility] She will continue her blood pressure medicine and will report to me in her next visit her blood pressure control at home

## 2018-08-21 DIAGNOSIS — J060 Acute laryngopharyngitis: Secondary | ICD-10-CM | POA: Diagnosis not present

## 2018-08-21 DIAGNOSIS — R05 Cough: Secondary | ICD-10-CM | POA: Diagnosis not present

## 2018-08-23 ENCOUNTER — Other Ambulatory Visit: Payer: 59

## 2018-08-23 ENCOUNTER — Ambulatory Visit: Payer: 59 | Admitting: Hematology and Oncology

## 2018-08-27 ENCOUNTER — Other Ambulatory Visit: Payer: Self-pay

## 2018-08-27 ENCOUNTER — Other Ambulatory Visit: Payer: Self-pay | Admitting: Hematology and Oncology

## 2018-08-27 ENCOUNTER — Inpatient Hospital Stay (HOSPITAL_BASED_OUTPATIENT_CLINIC_OR_DEPARTMENT_OTHER): Payer: 59 | Admitting: Hematology and Oncology

## 2018-08-27 ENCOUNTER — Encounter: Payer: Self-pay | Admitting: Hematology and Oncology

## 2018-08-27 ENCOUNTER — Inpatient Hospital Stay: Payer: 59

## 2018-08-27 DIAGNOSIS — E039 Hypothyroidism, unspecified: Secondary | ICD-10-CM | POA: Diagnosis not present

## 2018-08-27 DIAGNOSIS — Z5112 Encounter for antineoplastic immunotherapy: Secondary | ICD-10-CM | POA: Diagnosis not present

## 2018-08-27 DIAGNOSIS — I10 Essential (primary) hypertension: Secondary | ICD-10-CM

## 2018-08-27 DIAGNOSIS — J040 Acute laryngitis: Secondary | ICD-10-CM | POA: Diagnosis not present

## 2018-08-27 DIAGNOSIS — E041 Nontoxic single thyroid nodule: Secondary | ICD-10-CM | POA: Diagnosis not present

## 2018-08-27 DIAGNOSIS — Z923 Personal history of irradiation: Secondary | ICD-10-CM | POA: Diagnosis not present

## 2018-08-27 DIAGNOSIS — C541 Malignant neoplasm of endometrium: Secondary | ICD-10-CM

## 2018-08-27 DIAGNOSIS — C770 Secondary and unspecified malignant neoplasm of lymph nodes of head, face and neck: Secondary | ICD-10-CM | POA: Diagnosis not present

## 2018-08-27 DIAGNOSIS — Z79899 Other long term (current) drug therapy: Secondary | ICD-10-CM

## 2018-08-27 DIAGNOSIS — K1231 Oral mucositis (ulcerative) due to antineoplastic therapy: Secondary | ICD-10-CM | POA: Diagnosis not present

## 2018-08-27 DIAGNOSIS — Z9221 Personal history of antineoplastic chemotherapy: Secondary | ICD-10-CM

## 2018-08-27 NOTE — Assessment & Plan Note (Signed)
Her blood pressure is trending down due to discontinuation of bevacizumab I recommend slow taper of blood pressure medicine She will slowly wean herself off metoprolol and lisinopril I will continue to assess her blood pressure control in the next visit

## 2018-08-27 NOTE — Assessment & Plan Note (Signed)
Unfortunately, the patient is sick today with some symptoms of laryngitis I do not recommend her to return back to [facility] until I see her back in 2 weeks Continue supportive care

## 2018-08-27 NOTE — Assessment & Plan Note (Signed)
She has symptoms of acute laryngitis without fever or chills I recommend over-the-counter conservative management with nasal spray to reduce postnasal drip, cough drops and Tylenol as needed. We discussed precaution against infectious breath She will not resume Lenvima until I see her back.

## 2018-08-27 NOTE — Progress Notes (Signed)
[place] OFFICE PROGRESS NOTE  Patient Care Team: [name], MD as PCP - General (Family Medicine) Reynold Bowen, MD as Consulting Physician (Endocrinology)  ASSESSMENT & PLAN:  Endometrial cancer [facility]) Unfortunately, the patient is sick today with some symptoms of laryngitis I do not recommend her to return back to [facility] until I see her back in 2 weeks Continue supportive care  Acute laryngitis She has symptoms of acute laryngitis without fever or chills I recommend over-the-counter conservative management with nasal spray to reduce postnasal drip, cough drops and Tylenol as needed. We discussed precaution against infectious breath She will not resume Lenvima until I see her back.  Essential hypertension Her blood pressure is trending down due to discontinuation of bevacizumab I recommend slow taper of blood pressure medicine She will slowly wean herself off metoprolol and lisinopril I will continue to assess her blood pressure control in the next visit   No orders of the defined types were placed in this encounter.   INTERVAL HISTORY: Please see below for problem oriented charting. She is seen today to see if she can resume Lenvima In her previous visit she had mucositis and hand-foot syndrome Since discontinuation of Lenvima, her symptoms resolved Over the last 3 to 4 days, she complained of nasal drip, laryngitis with sore throat and some cough She denies fever or chills Denies shortness of breath SUMMARY OF ONCOLOGIC HISTORY: [place] [facility] testing done 11-2015 on surgical path from 2014: MS stable TMB low 4 muts/mb ATM [id number] ERBB3 T389K E2H2 rearrangement exon 9 PPP2R1A P179R TP53 [id number]    ER- APPROXIMATELY 25-35% STAINING IN NEOPLASTIC CELLS (INTERMEDIATE)  PR- APPROXIMATELY 25-35% STAINING IN NEOPLASTIC CELLS (STRONG)  Repeat biopsy 04/02/17: ER negative, Her 2 negative  Progressed on Doxil      Endometrial cancer ([place])   06/20/2012 Pathology Results    Biopsy positive for papillary serous carcinoma    06/20/2012 Genetic Testing    [place] [facility] testing done 11-2015 on surgical path from 2014: MS stable TMB low 4 muts/mb ATM [id number] ERBB3 T389K E2H2 rearrangement exon 9 PPP2R1A P179R TP53 I195N     06/20/2012 Initial Diagnosis    Patient presented to PCP with intermittent vaginal bleeding since ~ Oct 2013, endometrial biopsy 06-20-12 with complex endometrial hyperplasia with atypia    06/26/2012 Imaging    Thickened endometrial lining in a postmenopausal patient experiencing vaginal bleeding. In the setting of post-menopausal bleeding, endometrial sampling is indicated to exclude carcinoma. No focal myometrial abnormalities are seen.  Normal left ovary and non-visualized right ovary    07/30/2012 Surgery    Dr. Polly Cobia performed robotic hysterectomy with bilateral salpingo-oophorectomy, bilateral pelvic lymph node dissection and periaortic lymph node dissection. Intraoperatively on frozen section, the patient was noted to have a large endometrial polyp with changes within the polyp consistent for high-grade malignancy, possibly papillary serous carcinoma. There is no obvious extrauterine disease noted.      07/30/2012 Pathology Results    [phone] SUPPLEMENTAL REPORT  THE ENDOMETRIAL CARCINOMA WAS ANALYZED FOR DNA MISMATCH REPAIR PROTEINS.IMMUNOHISTOCHEMICALLY, THE NEOPLASM RETAINED NUCLEAR EXPRESSION OF 4 GENE PRODUCTS, MLH1, MSH2, MSH6, AND PMS2, INVOLVED IN DNA MISMATCH REPAIR.POSITIVE AND NEGATIVE CONTROLS WORKED APPROPRIATELY.  PER REQUEST, AN ER AND PR ARE PERFORMED ON BLOCK 1I.  ER- APPROXIMATELY 25-35% STAINING IN NEOPLASTIC CELLS (INTERMEDIATE)  PR- APPROXIMATELY 25-35% STAINING IN NEOPLASTIC CELLS (STRONG)  ER AND PR PREDOMINANTLY SHOW STAINING IN THE SEROUS COMPONENT.  DIAGNOSIS  1. UTERUS, CERVIX WITH BILATERAL  FALLOPIAN TUBES AND OVARIES,   HYSTERECTOMY AND BILATERAL SALPINGO-OOPHORECTOMY:  HIGH GRADE/POORLY DIFFERENTIATED ENDOMETRIAL ADENOCARCINOMA WITH SOLID AND SEROUS PAPILLARY COMPONENTS.  HISTOPATHOLOGIC TYPE:A VARIETY OF PATTERNS WERE PRESENT, ALL OF WHICH SHOULD BE CONSIDERED TO BE HIGH GRADE.SEROUS PAPILLARY CARCINOMA WAS SEEN OCCURRING ADJACENT TO SOLID ENDOMETRIAL CARCINOMA WITH MARKED ANAPLASIA AND GIANT CELLS. SIZE:TUMOR MEASURED AT LEAST 4.8 CM IN GREATEST HORIZONTAL DIMENSION.  GRADE: POORLY DIFFERENTIATED OR HIGH GRADE. DEPTH OF INVASION: NO DEFINITE MYOMETRIAL INVOLVEMENT WAS SEEN.THE TUMOR APPEARED CONFINED TO THE POLYP AS WELL AS SURFACE ENDOMETRIUM. WHERE MYOMETRIAL THICKNESS NOT APPLICABLE. SEROSAL INVOLVEMENT: NOT DEMONSTRATED.  ENDOCERVICAL INVOLVEMENT:NOT DEMONSTRATED. RESECTION MARGINS: FREE OF INVOLVEMENT. EXTRAUTERINE EXTENSION:NOT DEMONSTRATED. ANGIOLYMPHATIC INVASION: NOT DEFINITELY SEEN. TOTAL NODES EXAMINED:22.  PELVIC NODES EXAMINED:20.  PELVIC NODES INVOLVED:0.  PARA-AORTIC NODES 2. EXAMINED:  PARA-AORTIC NODES 0. INVOLVED TNM STAGE: T1A N0 MX. AJCC STAGE GROUPING: IA. FIGO STAGE:IA.    08/26/2012 Imaging    No CT evidence for intra-abdominal or pelvic metastatic disease. Trace free pelvic fluid, presumably postoperative although the date of surgery is not documented in the electronic medical record.    08/26/2012 - 12/13/2012 Chemotherapy    She received 6 cycles of carbo/taxol     10/29/2012 - 11/27/2012 Radiation Therapy    May 27, June 5,  June 11, June 19, November 27, 2012: Proximal vagina 30 Gy in 5 fractions      01/17/2013 Imaging    1.  No evidence of recurrent or metastatic disease. 2.  No acute abnormality involving the abdomen or pelvis. 3.  Mild diffuse hepatic steatosis. 4.  Very small supraumbilical midline anterior abdominal wall hernia containing fat, unchanged    01/22/2014  Imaging    No evidence for metastatic or recurrent disease. 2. No bowel obstruction.  Normal appendix. 3. Small fat containing hernia, stable in appearance. 4. Status post hysterectomy and bilateral oophorectomy    02/19/2014 Imaging    No pulmonary lesions are identified. The abnormality on the chest x-ray is due to asymmetric left-sided sternoclavicular joint degenerative disease    10/12/2014 Imaging    New mild retroperitoneal lymphadenopathy in the left paraaortic region and proximal left common iliac chain, consistent with metastatic disease. No other sites of metastatic disease identified within the abdomen or pelvis.      11/27/2014 - 02/11/2015 Chemotherapy    She received 4 cycles of carbo/taxol     03/01/2015 PET scan    Single hypermetabolic small retroperitoneal lymph node along the aorta. 2. No evidence of metastatic disease otherwise in the abdomen or pelvis. No evidence local recurrence. 3. Intensely hypermetabolic enlarged nodule adjacent to the RIGHT lobe of thyroid gland. This presumably represents the biopsied lesion in clinician report which was found to be benign thyroid tissue.    04/05/2015 - 05/13/2015 Radiation Therapy    She received 50.4 gray in 28 fractions with simultaneous integrated boost to 56 gray    06/17/2015 Imaging    No acute process or evidence of metastatic disease in the abdomen or pelvis. Resolution of previously described retroperitoneal adenopathy. 2.  Possible constipation. 3. Atherosclerosis.    06/17/2015 Tumor Marker    Patient's tumor was tested for the following markers: CA125 Results of the tumor marker test revealed 33    08/12/2015 Tumor Marker    Patient's tumor was tested for the following markers: CA125 Results of the tumor marker test revealed 52    08/31/2015 PET scan    Development of right paratracheal hypermetabolic adenopathy, consistent with nodal metastasis. 2.  The previously described isolated abdominal retroperitoneal  hypermetabolic node has resolved. 3. Persistent hypermetabolic right thyroid nodule, per report previously biopsied. Correlate with those results. 4.  Possible constipation.    09/09/2015 -  Anti-estrogen oral therapy    She has been receiving alternative treatment between megace and tamoxifen    09/09/2015 Tumor Marker    Patient's tumor was tested for the following markers: CA125 Results of the tumor marker test revealed 37.8    09/20/2015 Procedure    Technically successful ultrasound-guided thyroid aspiration biopsy , dominant right nodule    09/20/2015 Pathology Results    THYROID, RIGHT, FINE NEEDLE ASPIRATION (SPECIMEN 1 OF 1 COLLECTED 09/20/15): FINDINGS CONSISTENT WITH BENIGN THYROID NODULE (BETHESDA CATEGORY II).    10/28/2015 Tumor Marker    Patient's tumor was tested for the following markers: CA125 Results of the tumor marker test revealed 53.2    11/04/2015 Imaging    Stable benign right thyroid nodule    12/16/2015 Tumor Marker    Patient's tumor was tested for the following markers: CA125 Results of the tumor marker test revealed 38.1    03/22/2016 PET scan    Interval progression of hypermetabolic right paratracheal lymph node consistent with metastatic involvement. 2. Stable hypermetabolic right thyroid nodule. Reportedly this has been biopsied in the past.    03/23/2016 Tumor Marker    Patient's tumor was tested for the following markers: CA125 Results of the tumor marker test revealed 62.4    04/13/2016 - 06/01/2016 Radiation Therapy    She received 56 Gy to the chest in 28 fractions     05/09/2016 Imaging    No evidence of left lower extremity deep vein thrombosis. No evidence of a superficial thrombosis of the greater and lesser saphenous veins. Positive for thrombus noted in several varicosities of the calf. No evidence of Baker's cyst on the left.    05/17/2016 Tumor Marker    Patient's tumor was tested for the following markers: CA125 Results of the tumor marker  test revealed 9    06/29/2016 Tumor Marker    Patient's tumor was tested for the following markers: CA125 Results of the tumor marker test revealed 5.9    08/04/2016 Tumor Marker    Patient's tumor was tested for the following markers: CA125 Results of the tumor marker test revealed 6.0    09/12/2016 PET scan    Complete metabolic response to therapy, with resolution of hypermetabolic mediastinal lymphadenopathy since prior exam. No residual or new metastatic disease identified. Stable hypermetabolic right thyroid lobe nodule, which was previously biopsied on 09/20/2015.    03/13/2017 PET scan    1. Solitary focus of recurrent right paratracheal hypermetabolic activity, with a 1.0 cm right lower paratracheal node having a maximum SUV of 9.0. Appearance compatible with recurrent malignancy. 2. Continued hypermetabolic right thyroid nodule, previously biopsied, presumed benign -correlate with prior biopsy results. 3. Other imaging findings of potential clinical significance: Aortic Atherosclerosis (ICD10-I70.0). Mild cardiomegaly. Prominent stool throughout the colon favors constipation.    04/02/2017 Pathology Results    FINE NEEDLE ASPIRATION, ENDOSCOPIC, (EBUS) 4 R NODE (SPECIMEN 1 OF 2 COLLECTED 04/02/17): MALIGNANT CELLS PRESENT, CONSISTENT WITH CARCINOMA. SEE COMMENT. COMMENT: THE MALIGNANT CELLS ARE POSITIVE FOR P53 AND NEGATIVE FOR ESTROGEN RECEPTOR AND TTF-1. THIS PROFILE IS NON-SPECIFIC, BUT THE P53 POSITIVE STAINING IS SUGGESTIVE OF GYNECOLOGIC PRIMARY.     04/11/2017 Procedure    Successful 8 French right internal jugular vein power port placement with its tip at the SVC/RA junction  04/12/2017 Imaging    Normal LV size with mild LV hypertrophy. EF 55-60%. Normal RV size and systolic function. Aortic valve sclerosis without significant stenosis.    04/18/2017 - 06/14/2017 Chemotherapy    The patient had chemotherapy with Doxil     07/10/2017 Imaging    - Left ventricle: The  cavity size was normal. There was mild concentric hypertrophy. Systolic function was normal. The estimated ejection fraction was in the range of 55% to 60%. Wall motion was normal; there were no regional wall motion abnormalities. Doppler parameters are consistent with abnormal left ventricular relaxation (grade 1 diastolic dysfunction). - Aortic valve: Noncoronary cusp mobility was mildly restricted. - Mitral valve: There was mild regurgitation. - Left atrium: The atrium was mildly dilated. - Atrial septum: No defect or patent foramen ovale was identified.  Impressions:  - Compared to November 2018, global LV longitudinal strain remains normal (has increased)    07/11/2017 PET scan    Increased size and hypermetabolic activity of 3.3 cm right thyroid lobe nodule. Thyroid carcinoma cannot be excluded. Recommend repeat ultrasound guided fine needle aspiration to exclude thyroid carcinoma.  New adjacent hypermetabolic 10 mm right supraclavicular lymph node, suspicious for lymph node metastasis.  Slight increase in size and hypermetabolic activity of solitary right paratracheal lymph node.  No evidence of abdominal or pelvic metastatic disease.    07/18/2017 Procedure    1. Technically successful ultrasound guided fine needle aspiration of indeterminate hypermetabolic right-sided thyroid nodule/mass. 2. Technically successful ultrasound-guided core needle biopsy of hypermetabolic right lower cervical lymph node.    07/18/2017 Pathology Results    THYROID, FINE NEEDLE ASPIRATION RIGHT (SPECIMEN 1 OF 1, COLLECTED ON 07/18/2017): ATYPIA OF UNDETERMINED SIGNIFICANCE OR FOLLICULAR LESION OF UNDETERMINED SIGNIFICANCE ([place] CATEGORY III). SEE COMMENT. COMMENT: THE SPECIMEN CONSISTS OF SMALL AND MEDIUM SIZED GROUPS OF FOLLICULAR EPITHELIAL CELLS WITH MILD CYTOLOGIC ATYPIA INCLUDING NUCLEAR ENLARGEMENT AND HURTHLE CELL CHANGE. SOME GROUPS ARE ARRANGED AS MICROFOLLICLES. THERE IS MINIMAL BACKGROUND  COLLOID. BASED ON THESE FEATURES, A FOLLICULAR LESION/NEOPLASM CAN NOT BE ENTIRELY RULED OUT. A SPECIMEN WILL BE SENT FOR AFIRMA TESTING.    07/19/2017 Pathology Results    Lymph node, needle/core biopsy - METASTATIC PAPILLARY SEROUS CARCINOMA. - SEE COMMENT. Microscopic Comment Dr. Vicente Males has reviewed the case and concurs with this interpretation    10/15/2017 PET scan    No new or progressive disease. No evidence of abdominal or pelvic metastatic disease.  Stable hypermetabolic right thyroid lobe nodule and adjacent right supraclavicular lymph node.  Decreased size and hypermetabolic activity of solitary right paratracheal lymph node.    01/23/2018 PET scan    1. Overall, no significant change from PET-CT of 3 months ago. There is persistent hypermetabolic activity at the right thoracic inlet and within a right paratracheal mediastinal node. The nodes have not significantly changed in size, although the metabolic activity has minimally increased over this interval. It is uncertain how much of the thoracic inlet metabolic activity is attributed to the thyroid nodule versus adjacent cervical lymph nodes, both previously biopsied. 2. No new hypermetabolic activity within the neck, chest, abdomen or pelvis. No evidence of local recurrence in the pelvis. 3. Stable probable radiation changes in the right lung.    04/16/2018 PET scan    1. Interval increase in metabolic activity of the RIGHT supraclavicular node and RIGHT lower paratracheal mediastinal node with minimal change in size. Findings consistent with persistent and mildly progressed metastatic adenopathy. 2. New hypermetabolic small lymph node in the  RIGHT lower paratracheal nodal station adjacent to the previously followed node. 3. No evidence of local recurrence in the pelvis or new disease elsewhere.    07/30/2018 PET scan    1. Mild interval progression of right supraclavicular, mediastinal, and right hilar hypermetabolic  metastatic disease. There is a new hypermetabolic lymph node in the subcarinal station on today's study. 2. No hypermetabolic disease in the abdomen or pelvis to suggest recurrent/metastases. 3.  Aortic Atherosclerois (ICD10-170.0)     08/09/2018 -  Chemotherapy    The patient had Lenvima since 3/6 and pembrolizumab since 3/13 for chemotherapy treatment.      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      REVIEW OF SYSTEMS:   Constitutional: Denies fevers, chills or abnormal weight loss Eyes: Denies blurriness of vision Cardiovascular: Denies palpitation, chest discomfort or lower extremity swelling Gastrointestinal:  Denies nausea, heartburn or change in bowel habits Skin: Denies abnormal skin rashes Lymphatics: Denies new lymphadenopathy or easy bruising Neurological:Denies numbness, tingling or new weaknesses Behavioral/Psych: Mood is stable, no new changes  All other systems were reviewed with the patient and are negative.  I have reviewed the past medical history, past surgical history, social history and family history with the patient and they are unchanged from previous note.  ALLERGIES:  has No Known Allergies.  MEDICATIONS:  Current Outpatient Medications  Medication Sig Dispense Refill  . amLODipine (NORVASC) 10 MG tablet TAKE 1 TABLET BY MOUTH DAILY. DECREASE SIMVASTATIN TO 20MG DAILY WHILE ON AMLODIPINE PER MD 90 tablet 0  . Calcium Carb-Cholecalciferol 500-600 MG-UNIT TABS Take 1 tablet by mouth daily.     . Cholecalciferol (VITAMIN D3) 2000 units TABS Take 2,000 Units by mouth daily.     . hydrochlorothiazide (HYDRODIURIL) 25 MG tablet Take 1 tablet  (25 mg total) by mouth daily. 30 tablet 9  . lenvatinib 20 mg daily dose (LENVIMA, 20 MG DAILY DOSE,) 2 x 10 MG capsule Take 2 capsules (20 mg total) by mouth daily. 60 capsule 11  . levothyroxine (SYNTHROID, LEVOTHROID) 25 MCG tablet Take 1 tablet (25 mcg total) by mouth daily before breakfast. 30 tablet 3  . LORazepam (ATIVAN) 0.5 MG tablet Take 0.5 mg by mouth 2 (two) times daily as needed.  0  . simvastatin (ZOCOR) 40 MG tablet Take 40 mg by mouth every evening.    . tacrolimus (PROTOPIC) 0.1 % ointment Apply 1 application topically daily as needed (rash).     . venlafaxine XR (EFFEXOR-XR) 75 MG 24 hr capsule Take 75 mg by mouth daily with breakfast.      No current facility-administered medications for this visit.     PHYSICAL EXAMINATION: ECOG PERFORMANCE STATUS: 1 - Symptomatic but completely ambulatory  Vitals:   08/27/18 0833  BP: 124/60  Pulse: 70  Resp: 18  Temp: 98.3 F (36.8 C)  SpO2: 100%   Filed Weights   08/27/18 0833  Weight: 158 lb (71.7 kg)    GENERAL:alert, no distress and comfortable NEURO: alert & oriented x 3 with fluent speech, no focal motor/sensory deficits  LABORATORY DATA:  I have reviewed the data as listed    Component Value Date/Time  NA 137 08/16/2018 1008   NA 141 05/17/2017 0957   K 4.0 08/16/2018 1008   K 3.6 05/17/2017 0957   CL 100 08/16/2018 1008   CL 105 11/19/2012 0848   CO2 25 08/16/2018 1008   CO2 25 05/17/2017 0957   GLUCOSE 113 (H) 08/16/2018 1008   GLUCOSE 85 05/17/2017 0957   GLUCOSE 122 (H) 11/19/2012 0848   BUN 26 (H) 08/16/2018 1008   BUN 15.2 05/17/2017 0957   CREATININE 0.96 08/16/2018 1008   CREATININE 0.8 05/17/2017 0957   CALCIUM 9.4 08/16/2018 1008   CALCIUM 9.6 05/17/2017 0957   PROT 7.4 08/16/2018 1008   PROT 7.0 05/17/2017 0957   ALBUMIN 3.3 (L) 08/16/2018 1008   ALBUMIN 3.9 05/17/2017 0957   AST 19 08/16/2018 1008   AST 25 05/17/2017 0957   ALT 13 08/16/2018 1008   ALT 18 05/17/2017 0957   ALKPHOS  66 08/16/2018 1008   ALKPHOS 57 05/17/2017 0957   BILITOT 0.6 08/16/2018 1008   BILITOT 0.47 05/17/2017 0957   GFRNONAA >60 08/16/2018 1008   GFRAA >60 08/16/2018 1008    No results found for: SPEP, UPEP  Lab Results  Component Value Date   WBC 6.7 08/16/2018   NEUTROABS 5.2 08/16/2018   HGB 13.0 08/16/2018   HCT 38.5 08/16/2018   MCV 94.1 08/16/2018   PLT 203 08/16/2018      Chemistry      Component Value Date/Time   NA 137 08/16/2018 1008   NA 141 05/17/2017 0957   K 4.0 08/16/2018 1008   K 3.6 05/17/2017 0957   CL 100 08/16/2018 1008   CL 105 11/19/2012 0848   CO2 25 08/16/2018 1008   CO2 25 05/17/2017 0957   BUN 26 (H) 08/16/2018 1008   BUN 15.2 05/17/2017 0957   CREATININE 0.96 08/16/2018 1008   CREATININE 0.8 05/17/2017 0957   GLU 158 (H) 01/14/2015 1035      Component Value Date/Time   CALCIUM 9.4 08/16/2018 1008   CALCIUM 9.6 05/17/2017 0957   ALKPHOS 66 08/16/2018 1008   ALKPHOS 57 05/17/2017 0957   AST 19 08/16/2018 1008   AST 25 05/17/2017 0957   ALT 13 08/16/2018 1008   ALT 18 05/17/2017 0957   BILITOT 0.6 08/16/2018 1008   BILITOT 0.47 05/17/2017 0957       RADIOGRAPHIC STUDIES: I have personally reviewed the radiological images as listed and agreed with the findings in the report. Nm Pet Image Restag (ps) Skull Base To Thigh  Result Date: 07/30/2018 CLINICAL DATA:  Subsequent treatment strategy for endometrial cancer. EXAM: NUCLEAR MEDICINE PET SKULL BASE TO THIGH TECHNIQUE: 7.9 mCi F-18 FDG was injected intravenously. Full-ring PET imaging was performed from the skull base to thigh after the radiotracer. CT data was obtained and used for attenuation correction and anatomic localization. Fasting blood glucose: 77 mg/dl COMPARISON:  04/16/2018 FINDINGS: Mediastinal blood pool activity: SUV max 2.2 NECK: No hypermetabolic lymph nodes in the neck. Incidental CT findings: The hypermetabolic right thyroid gland is again identified, similar to prior.  Immediately adjacent hypermetabolic lymph node in the right thoracic inlet has progressed, measuring 14 mm today compared to 9 mm previously. SUV max = 11.0 compared to 7.3 previously. CHEST: Previously seen hypermetabolic lower right paratracheal index node is similar to prior with SUV max = 10.8 today compared to 11.1 previously. Lymph node size is similar at 11 mm short axis. The right hilar hypermetabolic disease has progressed with SUV max = 18.3 today  compared to 6.8 previously. 7 mm short axis subcarinal lymph node has become hypermetabolic in the interval with SUV max = 5.5 Incidental CT findings: Right Port-A-Cath tip is positioned in the mid right atrium. Medial right upper lobe scarring is similar to prior. 3 mm right upper lobe nodule (25/8) is more prominent than on the prior study. No focal airspace consolidation. No pleural effusion. ABDOMEN/PELVIS: No abnormal hypermetabolic activity within the liver, pancreas, adrenal glands, or spleen. No hypermetabolic lymph nodes in the abdomen or pelvis. Incidental CT findings: There is abdominal aortic atherosclerosis without aneurysm. SKELETON: No osseous FDG accumulation to raise concern for bony metastatic disease. Uptake in the paraspinal musculature of the upper thoracic region and in the posterior left shoulder musculature likely related to patient motion after radiopharmaceutical injection. Incidental CT findings: none IMPRESSION: 1. Mild interval progression of right supraclavicular, mediastinal, and right hilar hypermetabolic metastatic disease. There is a new hypermetabolic lymph node in the subcarinal station on today's study. 2. No hypermetabolic disease in the abdomen or pelvis to suggest recurrent/metastases. 3.  Aortic Atherosclerois (ICD10-170.0) Electronically Signed   By: Misty Stanley M.D.   On: 07/30/2018 13:16    All questions were answered. The patient knows to call the clinic with any problems, questions or concerns. No barriers to  learning was detected.  I spent 15 minutes counseling the patient face to face. The total time spent in the appointment was 20 minutes and more than 50% was on counseling and review of test results  [name], MD 08/27/2018 9:15 AM

## 2018-08-29 ENCOUNTER — Other Ambulatory Visit: Payer: Self-pay | Admitting: Hematology and Oncology

## 2018-08-29 MED FILL — HYDROCHLOROTHIAZIDE 25 MG T: 25 | 60 days supply | Qty: 60 | Fill #0

## 2018-08-29 MED FILL — SIMVASTATIN 20 MG TABLET: 20 | 90 days supply | Qty: 90 | Fill #0

## 2018-08-29 MED FILL — LEVOTHYROXINE 25 MCG TABLET: 25 | 30 days supply | Qty: 30 | Fill #0

## 2018-08-30 ENCOUNTER — Other Ambulatory Visit: Payer: 59

## 2018-09-04 ENCOUNTER — Other Ambulatory Visit: Payer: Self-pay | Admitting: Hematology and Oncology

## 2018-09-04 MED FILL — VENLAFAXINE HCL ER 150 MG C: 150 | 30 days supply | Qty: 60 | Fill #0

## 2018-09-06 ENCOUNTER — Other Ambulatory Visit: Payer: Self-pay

## 2018-09-06 ENCOUNTER — Inpatient Hospital Stay: Payer: 59 | Attending: Hematology and Oncology

## 2018-09-06 ENCOUNTER — Inpatient Hospital Stay (HOSPITAL_BASED_OUTPATIENT_CLINIC_OR_DEPARTMENT_OTHER): Payer: 59 | Admitting: Hematology and Oncology

## 2018-09-06 ENCOUNTER — Other Ambulatory Visit: Payer: Self-pay | Admitting: Hematology and Oncology

## 2018-09-06 ENCOUNTER — Encounter: Payer: Self-pay | Admitting: Hematology and Oncology

## 2018-09-06 ENCOUNTER — Inpatient Hospital Stay: Payer: 59

## 2018-09-06 DIAGNOSIS — Z9221 Personal history of antineoplastic chemotherapy: Secondary | ICD-10-CM | POA: Insufficient documentation

## 2018-09-06 DIAGNOSIS — C786 Secondary malignant neoplasm of retroperitoneum and peritoneum: Secondary | ICD-10-CM

## 2018-09-06 DIAGNOSIS — I10 Essential (primary) hypertension: Secondary | ICD-10-CM | POA: Insufficient documentation

## 2018-09-06 DIAGNOSIS — Z90722 Acquired absence of ovaries, bilateral: Secondary | ICD-10-CM | POA: Insufficient documentation

## 2018-09-06 DIAGNOSIS — E032 Hypothyroidism due to medicaments and other exogenous substances: Secondary | ICD-10-CM | POA: Diagnosis not present

## 2018-09-06 DIAGNOSIS — Z79899 Other long term (current) drug therapy: Secondary | ICD-10-CM | POA: Insufficient documentation

## 2018-09-06 DIAGNOSIS — C541 Malignant neoplasm of endometrium: Secondary | ICD-10-CM

## 2018-09-06 DIAGNOSIS — Z90710 Acquired absence of both cervix and uterus: Secondary | ICD-10-CM | POA: Diagnosis not present

## 2018-09-06 DIAGNOSIS — R809 Proteinuria, unspecified: Secondary | ICD-10-CM | POA: Insufficient documentation

## 2018-09-06 DIAGNOSIS — Z95828 Presence of other vascular implants and grafts: Secondary | ICD-10-CM

## 2018-09-06 DIAGNOSIS — C770 Secondary and unspecified malignant neoplasm of lymph nodes of head, face and neck: Secondary | ICD-10-CM

## 2018-09-06 DIAGNOSIS — L271 Localized skin eruption due to drugs and medicaments taken internally: Secondary | ICD-10-CM

## 2018-09-06 DIAGNOSIS — I700 Atherosclerosis of aorta: Secondary | ICD-10-CM | POA: Diagnosis not present

## 2018-09-06 DIAGNOSIS — R5383 Other fatigue: Secondary | ICD-10-CM | POA: Diagnosis not present

## 2018-09-06 DIAGNOSIS — Z5112 Encounter for antineoplastic immunotherapy: Secondary | ICD-10-CM | POA: Insufficient documentation

## 2018-09-06 DIAGNOSIS — Z923 Personal history of irradiation: Secondary | ICD-10-CM | POA: Insufficient documentation

## 2018-09-06 DIAGNOSIS — Z7189 Other specified counseling: Secondary | ICD-10-CM

## 2018-09-06 LAB — TSH: TSH: 2.088 u[IU]/mL (ref 0.308–3.960)

## 2018-09-06 LAB — CBC WITH DIFFERENTIAL/PLATELET
Abs Immature Granulocytes: 0.01 10*3/uL (ref 0.00–0.07)
Basophils Absolute: 0.1 10*3/uL (ref 0.0–0.1)
Basophils Relative: 1 %
Eosinophils Absolute: 0.2 10*3/uL (ref 0.0–0.5)
Eosinophils Relative: 6 %
HCT: 34.1 % — ABNORMAL LOW (ref 36.0–46.0)
Hemoglobin: 11 g/dL — ABNORMAL LOW (ref 12.0–15.0)
Immature Granulocytes: 0 %
Lymphocytes Relative: 16 %
Lymphs Abs: 0.7 10*3/uL (ref 0.7–4.0)
MCH: 31.7 pg (ref 26.0–34.0)
MCHC: 32.3 g/dL (ref 30.0–36.0)
MCV: 98.3 fL (ref 80.0–100.0)
Monocytes Absolute: 0.4 10*3/uL (ref 0.1–1.0)
Monocytes Relative: 9 %
Neutro Abs: 2.8 10*3/uL (ref 1.7–7.7)
Neutrophils Relative %: 68 %
Platelets: 214 10*3/uL (ref 150–400)
RBC: 3.47 MIL/uL — ABNORMAL LOW (ref 3.87–5.11)
RDW: 13.5 % (ref 11.5–15.5)
WBC: 4.2 10*3/uL (ref 4.0–10.5)
nRBC: 0 % (ref 0.0–0.2)

## 2018-09-06 LAB — COMPREHENSIVE METABOLIC PANEL
ALT: 18 U/L (ref 0–44)
AST: 23 U/L (ref 15–41)
Albumin: 3.6 g/dL (ref 3.5–5.0)
Alkaline Phosphatase: 56 U/L (ref 38–126)
Anion gap: 10 (ref 5–15)
BUN: 22 mg/dL (ref 8–23)
CO2: 26 mmol/L (ref 22–32)
Calcium: 9.6 mg/dL (ref 8.9–10.3)
Chloride: 101 mmol/L (ref 98–111)
Creatinine, Ser: 0.83 mg/dL (ref 0.44–1.00)
GFR calc Af Amer: >60 mL/min (ref >60)
GFR calc non Af Amer: >60 mL/min (ref >60)
Glucose, Bld: 76 mg/dL (ref 70–99)
Potassium: 4 mmol/L (ref 3.5–5.1)
Sodium: 137 mmol/L (ref 135–145)
Total Bilirubin: 0.4 mg/dL (ref 0.3–1.2)
Total Protein: 7.4 g/dL (ref 6.5–8.1)

## 2018-09-06 LAB — TOTAL PROTEIN, URINE DIPSTICK: Protein, ur: <30 mg/dL — AB

## 2018-09-06 MED ORDER — SODIUM CHLORIDE 0.9 % IV SOLN
200.0000 mg | Freq: Once | INTRAVENOUS | Status: AC
  Administered 2018-09-06: 200 mg via INTRAVENOUS
  Filled 2018-09-06: qty 8

## 2018-09-06 MED ORDER — SODIUM CHLORIDE 0.9 % IV SOLN
Freq: Once | INTRAVENOUS | Status: AC
  Administered 2018-09-06: 11:00:00 via INTRAVENOUS
  Filled 2018-09-06: qty 250

## 2018-09-06 MED ORDER — SODIUM CHLORIDE 0.9% FLUSH
10.0000 mL | Freq: Once | INTRAVENOUS | Status: AC
  Administered 2018-09-06: 10 mL
  Filled 2018-09-06: qty 10

## 2018-09-06 MED ORDER — HEPARIN SOD (PORK) LOCK FLUSH 100 UNIT/ML IV SOLN
500.0000 [IU] | Freq: Once | INTRAVENOUS | Status: AC | PRN
  Administered 2018-09-06: 500 [IU]
  Filled 2018-09-06: qty 5

## 2018-09-06 MED ORDER — SODIUM CHLORIDE 0.9% FLUSH
10.0000 mL | INTRAVENOUS | Status: DC | PRN
  Administered 2018-09-06: 10 mL
  Filled 2018-09-06: qty 10

## 2018-09-06 MED ORDER — LENVATINIB (20 MG DAILY DOSE) 2 X 10 MG PO CPPK: 10.0000 mg | ORAL_CAPSULE | Freq: Every day | ORAL | 11 refills | Status: DC

## 2018-09-06 MED FILL — busPIRone HCL 10 MG TABS: 10 | 30 days supply | Qty: 60 | Fill #0

## 2018-09-06 NOTE — Assessment & Plan Note (Signed)
Her blood pressure is satisfactory Her proteinuria has improved She will proceed with Lenvima and will continue her prescription blood pressure medication I will call her next week to check on her

## 2018-09-06 NOTE — Progress Notes (Signed)
[place] OFFICE PROGRESS NOTE  Patient Care Team: [name], MD as PCP - General (Family Medicine) Reynold Bowen, MD as Consulting Physician (Endocrinology)  ASSESSMENT & PLAN:  Endometrial cancer [facility]) She has fully recovered from recent laryngitis She will proceed with [facility] today I recommend her to restart Lenvima at half a dose of 10 mg daily I will call her next week to check on her for toxicity I will see her back again at the end of the month for next dose of treatment  Essential hypertension Her blood pressure is satisfactory Her proteinuria has improved She will proceed with Lenvima and will continue her prescription blood pressure medication I will call her next week to check on her  Hand foot syndrome She has no signs of hand-foot syndrome and has fully recovered since we stopped her Lenvima several weeks ago She will resume at half a dose   No orders of the defined types were placed in this encounter.   INTERVAL HISTORY: Please see below for problem oriented charting. She returns for chemotherapy and follow-up She feels well Laryngitis has almost completely resolved She has no further symptoms of hand-foot syndrome Her blood pressure control is satisfactory No recent fever or chills Denies shortness of breath  SUMMARY OF ONCOLOGIC HISTORY: Oncology History   [employer] testing done 11-2015 on surgical path from 2014: MS stable TMB low 4 muts/mb ATM [id number] ERBB3 T389K E2H2 rearrangement exon 9 PPP2R1A P179R TP53 I195N    ER- APPROXIMATELY 25-35% STAINING IN NEOPLASTIC CELLS (INTERMEDIATE)  PR- APPROXIMATELY 25-35% STAINING IN NEOPLASTIC CELLS (STRONG)  Repeat biopsy 04/02/17: ER negative, Her 2 negative  Progressed on Doxil     Endometrial cancer ([place])   06/20/2012 Pathology Results    Biopsy positive for papillary serous carcinoma    06/20/2012 Genetic Testing    [employer] testing done 11-2015 on  surgical path from 2014: MS stable TMB low 4 muts/mb ATM [id number] ERBB3 T389K E2H2 rearrangement exon 9 PPP2R1A P179R TP53 I195N     06/20/2012 Initial Diagnosis    Patient presented to PCP with intermittent vaginal bleeding since ~ Oct 2013, endometrial biopsy 06-20-12 with complex endometrial hyperplasia with atypia    06/26/2012 Imaging    Thickened endometrial lining in a postmenopausal patient experiencing vaginal bleeding. In the setting of post-menopausal bleeding, endometrial sampling is indicated to exclude carcinoma. No focal myometrial abnormalities are seen.  Normal left ovary and non-visualized right ovary    07/30/2012 Surgery    Dr. Polly Cobia performed robotic hysterectomy with bilateral salpingo-oophorectomy, bilateral pelvic lymph node dissection and periaortic lymph node dissection. Intraoperatively on frozen section, the patient was noted to have a large endometrial polyp with changes within the polyp consistent for high-grade malignancy, possibly papillary serous carcinoma. There is no obvious extrauterine disease noted.      07/30/2012 Pathology Results    [phone] SUPPLEMENTAL REPORT  THE ENDOMETRIAL CARCINOMA WAS ANALYZED FOR DNA MISMATCH REPAIR PROTEINS.IMMUNOHISTOCHEMICALLY, THE NEOPLASM RETAINED NUCLEAR EXPRESSION OF 4 GENE PRODUCTS, MLH1, MSH2, MSH6, AND PMS2, INVOLVED IN DNA MISMATCH REPAIR.POSITIVE AND NEGATIVE CONTROLS WORKED APPROPRIATELY.  PER REQUEST, AN ER AND PR ARE PERFORMED ON BLOCK 1I.  ER- APPROXIMATELY 25-35% STAINING IN NEOPLASTIC CELLS (INTERMEDIATE)  PR- APPROXIMATELY 25-35% STAINING IN NEOPLASTIC CELLS (STRONG)  ER AND PR PREDOMINANTLY SHOW STAINING IN THE SEROUS COMPONENT.  DIAGNOSIS  1. UTERUS, CERVIX WITH BILATERAL FALLOPIAN TUBES AND OVARIES,  HYSTERECTOMY AND BILATERAL SALPINGO-OOPHORECTOMY:  HIGH GRADE/POORLY DIFFERENTIATED ENDOMETRIAL ADENOCARCINOMA WITH SOLID AND SEROUS PAPILLARY  COMPONENTS.  HISTOPATHOLOGIC  TYPE:A VARIETY OF PATTERNS WERE PRESENT, ALL OF WHICH SHOULD BE CONSIDERED TO BE HIGH GRADE.SEROUS PAPILLARY CARCINOMA WAS SEEN OCCURRING ADJACENT TO SOLID ENDOMETRIAL CARCINOMA WITH MARKED ANAPLASIA AND GIANT CELLS. SIZE:TUMOR MEASURED AT LEAST 4.8 CM IN GREATEST HORIZONTAL DIMENSION.  GRADE: POORLY DIFFERENTIATED OR HIGH GRADE. DEPTH OF INVASION: NO DEFINITE MYOMETRIAL INVOLVEMENT WAS SEEN.THE TUMOR APPEARED CONFINED TO THE POLYP AS WELL AS SURFACE ENDOMETRIUM. WHERE MYOMETRIAL THICKNESS NOT APPLICABLE. SEROSAL INVOLVEMENT: NOT DEMONSTRATED.  ENDOCERVICAL INVOLVEMENT:NOT DEMONSTRATED. RESECTION MARGINS: FREE OF INVOLVEMENT. EXTRAUTERINE EXTENSION:NOT DEMONSTRATED. ANGIOLYMPHATIC INVASION: NOT DEFINITELY SEEN. TOTAL NODES EXAMINED:22.  PELVIC NODES EXAMINED:20.  PELVIC NODES INVOLVED:0.  PARA-AORTIC NODES 2. EXAMINED:  PARA-AORTIC NODES 0. INVOLVED TNM STAGE: T1A N0 MX. AJCC STAGE GROUPING: IA. FIGO STAGE:IA.    08/26/2012 Imaging    No CT evidence for intra-abdominal or pelvic metastatic disease. Trace free pelvic fluid, presumably postoperative although the date of surgery is not documented in the electronic medical record.    08/26/2012 - 12/13/2012 Chemotherapy    She received 6 cycles of carbo/taxol     10/29/2012 - 11/27/2012 Radiation Therapy    May 27, June 5,  June 11, June 19, November 27, 2012: Proximal vagina 30 Gy in 5 fractions      01/17/2013 Imaging    1.  No evidence of recurrent or metastatic disease. 2.  No acute abnormality involving the abdomen or pelvis. 3.  Mild diffuse hepatic steatosis. 4.  Very small supraumbilical midline anterior abdominal wall hernia containing fat, unchanged    01/22/2014 Imaging    No evidence for metastatic or recurrent disease. 2. No bowel obstruction.  Normal appendix. 3. Small fat containing hernia, stable in appearance. 4. Status post  hysterectomy and bilateral oophorectomy    02/19/2014 Imaging    No pulmonary lesions are identified. The abnormality on the chest x-ray is due to asymmetric left-sided sternoclavicular joint degenerative disease    10/12/2014 Imaging    New mild retroperitoneal lymphadenopathy in the left paraaortic region and proximal left common iliac chain, consistent with metastatic disease. No other sites of metastatic disease identified within the abdomen or pelvis.      11/27/2014 - 02/11/2015 Chemotherapy    She received 4 cycles of carbo/taxol     03/01/2015 PET scan    Single hypermetabolic small retroperitoneal lymph node along the aorta. 2. No evidence of metastatic disease otherwise in the abdomen or pelvis. No evidence local recurrence. 3. Intensely hypermetabolic enlarged nodule adjacent to the RIGHT lobe of thyroid gland. This presumably represents the biopsied lesion in clinician report which was found to be benign thyroid tissue.    04/05/2015 - 05/13/2015 Radiation Therapy    She received 50.4 gray in 28 fractions with simultaneous integrated boost to 56 gray    06/17/2015 Imaging    No acute process or evidence of metastatic disease in the abdomen or pelvis. Resolution of previously described retroperitoneal adenopathy. 2.  Possible constipation. 3. Atherosclerosis.    06/17/2015 Tumor Marker    Patient's tumor was tested for the following markers: CA125 Results of the tumor marker test revealed 33    08/12/2015 Tumor Marker    Patient's tumor was tested for the following markers: CA125 Results of the tumor marker test revealed 52    08/31/2015 PET scan    Development of right paratracheal hypermetabolic adenopathy, consistent with nodal metastasis. 2. The previously described isolated abdominal retroperitoneal hypermetabolic node has resolved. 3. Persistent hypermetabolic right thyroid nodule, per report previously biopsied.  Correlate with those results. 4.  Possible constipation.     09/09/2015 -  Anti-estrogen oral therapy    She has been receiving alternative treatment between megace and tamoxifen    09/09/2015 Tumor Marker    Patient's tumor was tested for the following markers: CA125 Results of the tumor marker test revealed 37.8    09/20/2015 Procedure    Technically successful ultrasound-guided thyroid aspiration biopsy , dominant right nodule    09/20/2015 Pathology Results    THYROID, RIGHT, FINE NEEDLE ASPIRATION (SPECIMEN 1 OF 1 COLLECTED 09/20/15): FINDINGS CONSISTENT WITH BENIGN THYROID NODULE (BETHESDA CATEGORY II).    10/28/2015 Tumor Marker    Patient's tumor was tested for the following markers: CA125 Results of the tumor marker test revealed 53.2    11/04/2015 Imaging    Stable benign right thyroid nodule    12/16/2015 Tumor Marker    Patient's tumor was tested for the following markers: CA125 Results of the tumor marker test revealed 38.1    03/22/2016 PET scan    Interval progression of hypermetabolic right paratracheal lymph node consistent with metastatic involvement. 2. Stable hypermetabolic right thyroid nodule. Reportedly this has been biopsied in the past.    03/23/2016 Tumor Marker    Patient's tumor was tested for the following markers: CA125 Results of the tumor marker test revealed 62.4    04/13/2016 - 06/01/2016 Radiation Therapy    She received 56 Gy to the chest in 28 fractions     05/09/2016 Imaging    No evidence of left lower extremity deep vein thrombosis. No evidence of a superficial thrombosis of the greater and lesser saphenous veins. Positive for thrombus noted in several varicosities of the calf. No evidence of Baker's cyst on the left.    05/17/2016 Tumor Marker    Patient's tumor was tested for the following markers: CA125 Results of the tumor marker test revealed 9    06/29/2016 Tumor Marker    Patient's tumor was tested for the following markers: CA125 Results of the tumor marker test revealed 5.9    08/04/2016 Tumor  Marker    Patient's tumor was tested for the following markers: CA125 Results of the tumor marker test revealed 6.0    09/12/2016 PET scan    Complete metabolic response to therapy, with resolution of hypermetabolic mediastinal lymphadenopathy since prior exam. No residual or new metastatic disease identified. Stable hypermetabolic right thyroid lobe nodule, which was previously biopsied on 09/20/2015.    03/13/2017 PET scan    1. Solitary focus of recurrent right paratracheal hypermetabolic activity, with a 1.0 cm right lower paratracheal node having a maximum SUV of 9.0. Appearance compatible with recurrent malignancy. 2. Continued hypermetabolic right thyroid nodule, previously biopsied, presumed benign -correlate with prior biopsy results. 3. Other imaging findings of potential clinical significance: Aortic Atherosclerosis (ICD10-I70.0). Mild cardiomegaly. Prominent stool throughout the colon favors constipation.    04/02/2017 Pathology Results    FINE NEEDLE ASPIRATION, ENDOSCOPIC, (EBUS) 4 R NODE (SPECIMEN 1 OF 2 COLLECTED 04/02/17): MALIGNANT CELLS PRESENT, CONSISTENT WITH CARCINOMA. SEE COMMENT. COMMENT: THE MALIGNANT CELLS ARE POSITIVE FOR P53 AND NEGATIVE FOR ESTROGEN RECEPTOR AND TTF-1. THIS PROFILE IS NON-SPECIFIC, BUT THE P53 POSITIVE STAINING IS SUGGESTIVE OF GYNECOLOGIC PRIMARY.     04/11/2017 Procedure    Successful 8 French right internal jugular vein power port placement with its tip at the SVC/RA junction    04/12/2017 Imaging    Normal LV size with mild LV hypertrophy. EF 55-60%. Normal RV size and  systolic function. Aortic valve sclerosis without significant stenosis.    04/18/2017 - 06/14/2017 Chemotherapy    The patient had chemotherapy with Doxil     07/10/2017 Imaging    - Left ventricle: The cavity size was normal. There was mild concentric hypertrophy. Systolic function was normal. The estimated ejection fraction was in the range of 55% to 60%. Wall motion was  normal; there were no regional wall motion abnormalities. Doppler parameters are consistent with abnormal left ventricular relaxation (grade 1 diastolic dysfunction). - Aortic valve: Noncoronary cusp mobility was mildly restricted. - Mitral valve: There was mild regurgitation. - Left atrium: The atrium was mildly dilated. - Atrial septum: No defect or patent foramen ovale was identified.  Impressions:  - Compared to November 2018, global LV longitudinal strain remains normal (has increased)    07/11/2017 PET scan    Increased size and hypermetabolic activity of 3.3 cm right thyroid lobe nodule. Thyroid carcinoma cannot be excluded. Recommend repeat ultrasound guided fine needle aspiration to exclude thyroid carcinoma.  New adjacent hypermetabolic 10 mm right supraclavicular lymph node, suspicious for lymph node metastasis.  Slight increase in size and hypermetabolic activity of solitary right paratracheal lymph node.  No evidence of abdominal or pelvic metastatic disease.    07/18/2017 Procedure    1. Technically successful ultrasound guided fine needle aspiration of indeterminate hypermetabolic right-sided thyroid nodule/mass. 2. Technically successful ultrasound-guided core needle biopsy of hypermetabolic right lower cervical lymph node.    07/18/2017 Pathology Results    THYROID, FINE NEEDLE ASPIRATION RIGHT (SPECIMEN 1 OF 1, COLLECTED ON 07/18/2017): ATYPIA OF UNDETERMINED SIGNIFICANCE OR FOLLICULAR LESION OF UNDETERMINED SIGNIFICANCE (BETHESDA CATEGORY III). SEE COMMENT. COMMENT: THE SPECIMEN CONSISTS OF SMALL AND MEDIUM SIZED GROUPS OF FOLLICULAR EPITHELIAL CELLS WITH MILD CYTOLOGIC ATYPIA INCLUDING NUCLEAR ENLARGEMENT AND HURTHLE CELL CHANGE. SOME GROUPS ARE ARRANGED AS MICROFOLLICLES. THERE IS MINIMAL BACKGROUND COLLOID. BASED ON THESE FEATURES, A FOLLICULAR LESION/NEOPLASM CAN NOT BE ENTIRELY RULED OUT. A SPECIMEN WILL BE SENT FOR AFIRMA TESTING.    07/19/2017 Pathology Results     Lymph node, needle/core biopsy - METASTATIC PAPILLARY SEROUS CARCINOMA. - SEE COMMENT. Microscopic Comment Dr. Vicente Males has reviewed the case and concurs with this interpretation    10/15/2017 PET scan    No new or progressive disease. No evidence of abdominal or pelvic metastatic disease.  Stable hypermetabolic right thyroid lobe nodule and adjacent right supraclavicular lymph node.  Decreased size and hypermetabolic activity of solitary right paratracheal lymph node.    01/23/2018 PET scan    1. Overall, no significant change from PET-CT of 3 months ago. There is persistent hypermetabolic activity at the right thoracic inlet and within a right paratracheal mediastinal node. The nodes have not significantly changed in size, although the metabolic activity has minimally increased over this interval. It is uncertain how much of the thoracic inlet metabolic activity is attributed to the thyroid nodule versus adjacent cervical lymph nodes, both previously biopsied. 2. No new hypermetabolic activity within the neck, chest, abdomen or pelvis. No evidence of local recurrence in the pelvis. 3. Stable probable radiation changes in the right lung.    04/16/2018 PET scan    1. Interval increase in metabolic activity of the RIGHT supraclavicular node and RIGHT lower paratracheal mediastinal node with minimal change in size. Findings consistent with persistent and mildly progressed metastatic adenopathy. 2. New hypermetabolic small lymph node in the RIGHT lower paratracheal nodal station adjacent to the previously followed node. 3. No evidence of local recurrence in  the pelvis or new disease elsewhere.    07/30/2018 PET scan    1. Mild interval progression of right supraclavicular, mediastinal, and right hilar hypermetabolic metastatic disease. There is a new hypermetabolic lymph node in the subcarinal station on today's study. 2. No hypermetabolic disease in the abdomen or pelvis to suggest  recurrent/metastases. 3.  Aortic Atherosclerois (ICD10-170.0)     08/09/2018 -  Chemotherapy    The patient had Lenvima since 3/6 and pembrolizumab since 3/13 for chemotherapy treatment. 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      REVIEW OF SYSTEMS:   Constitutional: Denies fevers, chills or abnormal weight loss Eyes: Denies blurriness of vision Ears, nose, mouth, throat, and face: Denies mucositis Respiratory: Denies cough, dyspnea or wheezes Cardiovascular: Denies palpitation, chest discomfort or lower extremity swelling Gastrointestinal:  Denies nausea, heartburn or change in bowel habits Skin: Denies abnormal skin rashes Lymphatics: Denies new lymphadenopathy or easy bruising Neurological:Denies numbness, tingling or new weaknesses Behavioral/Psych: Mood is stable, no new changes  All other systems were reviewed with the patient and are negative.  I have reviewed the past medical history, past surgical history, social history and family history with the patient and they are unchanged from previous note.  ALLERGIES:  has No Known Allergies.  MEDICATIONS:  Current Outpatient Medications  Medication Sig Dispense Refill  . amLODipine (NORVASC) 10 MG tablet TAKE 1 TABLET BY MOUTH DAILY. DECREASE SIMVASTATIN TO 20MG DAILY WHILE ON AMLODIPINE PER MD 90 tablet 0  . Calcium Carb-Cholecalciferol 500-600 MG-UNIT TABS Take 1 tablet by mouth daily.     . Cholecalciferol (VITAMIN D3) 2000 units TABS Take 2,000 Units by mouth daily.     . hydrochlorothiazide (HYDRODIURIL) 25 MG tablet Take 1 tablet  (25 mg total) by mouth daily. 30 tablet 9  . lenvatinib 20 mg daily dose (LENVIMA, 20 MG DAILY DOSE,) 2 x 10 MG capsule Take 1 capsule (10 mg total) by mouth daily. 30 capsule 11  . levothyroxine (SYNTHROID, LEVOTHROID) 25 MCG tablet Take 1 tablet (25 mcg total) by mouth daily before breakfast. 30 tablet 3  . LORazepam (ATIVAN) 0.5 MG tablet Take 0.5 mg by mouth 2 (two) times daily as needed.  0  . simvastatin (ZOCOR) 40 MG tablet Take 40 mg by mouth every evening.    . tacrolimus (PROTOPIC) 0.1 % ointment Apply 1 application topically daily as needed (rash).     . venlafaxine XR (EFFEXOR-XR) 75 MG 24 hr capsule Take 75 mg by mouth daily with breakfast.      No current facility-administered medications for this visit.     PHYSICAL EXAMINATION: ECOG PERFORMANCE STATUS: 1 - Symptomatic but completely ambulatory  Vitals:   09/06/18 1050  BP: 132/73  Pulse: 86  Resp: 18  Temp: 97.6 F (36.4 C)  SpO2: 99%   Filed Weights   09/06/18 1050  Weight: 159 lb 4.8 oz (72.3 kg)    GENERAL:alert, no distress and comfortable SKIN: skin color, texture, turgor are normal, no rashes or significant lesions EYES: normal, Conjunctiva are pink and non-injected, sclera clear OROPHARYNX:no exudate, no erythema  and lips, buccal mucosa, and tongue normal  NECK: supple, thyroid normal size, non-tender, without nodularity LYMPH:  no palpable lymphadenopathy in the cervical, axillary or inguinal LUNGS: clear to auscultation and percussion with normal breathing effort HEART: regular rate & rhythm and no murmurs and no lower extremity edema ABDOMEN:abdomen soft, non-tender and normal bowel sounds Musculoskeletal:no cyanosis of digits and no clubbing  NEURO: alert & oriented x 3 with fluent speech, no focal motor/sensory deficits  LABORATORY DATA:  I have reviewed the data as listed    Component Value Date/Time   NA 137 08/16/2018 1008   NA 141 05/17/2017 0957   K 4.0 08/16/2018 1008   K 3.6 05/17/2017  0957   CL 100 08/16/2018 1008   CL 105 11/19/2012 0848   CO2 25 08/16/2018 1008   CO2 25 05/17/2017 0957   GLUCOSE 113 (H) 08/16/2018 1008   GLUCOSE 85 05/17/2017 0957   GLUCOSE 122 (H) 11/19/2012 0848   BUN 26 (H) 08/16/2018 1008   BUN 15.2 05/17/2017 0957   CREATININE 0.96 08/16/2018 1008   CREATININE 0.8 05/17/2017 0957   CALCIUM 9.4 08/16/2018 1008   CALCIUM 9.6 05/17/2017 0957   PROT 7.4 08/16/2018 1008   PROT 7.0 05/17/2017 0957   ALBUMIN 3.3 (L) 08/16/2018 1008   ALBUMIN 3.9 05/17/2017 0957   AST 19 08/16/2018 1008   AST 25 05/17/2017 0957   ALT 13 08/16/2018 1008   ALT 18 05/17/2017 0957   ALKPHOS 66 08/16/2018 1008   ALKPHOS 57 05/17/2017 0957   BILITOT 0.6 08/16/2018 1008   BILITOT 0.47 05/17/2017 0957   GFRNONAA >60 08/16/2018 1008   GFRAA >60 08/16/2018 1008    No results found for: SPEP, UPEP  Lab Results  Component Value Date   WBC 4.2 09/06/2018   NEUTROABS 2.8 09/06/2018   HGB 11.0 (L) 09/06/2018   HCT 34.1 (L) 09/06/2018   MCV 98.3 09/06/2018   PLT 214 09/06/2018      Chemistry      Component Value Date/Time   NA 137 08/16/2018 1008   NA 141 05/17/2017 0957   K 4.0 08/16/2018 1008   K 3.6 05/17/2017 0957   CL 100 08/16/2018 1008   CL 105 11/19/2012 0848   CO2 25 08/16/2018 1008   CO2 25 05/17/2017 0957   BUN 26 (H) 08/16/2018 1008   BUN 15.2 05/17/2017 0957   CREATININE 0.96 08/16/2018 1008   CREATININE 0.8 05/17/2017 0957   GLU 158 (H) 01/14/2015 1035      Component Value Date/Time   CALCIUM 9.4 08/16/2018 1008   CALCIUM 9.6 05/17/2017 0957   ALKPHOS 66 08/16/2018 1008   ALKPHOS 57 05/17/2017 0957   AST 19 08/16/2018 1008   AST 25 05/17/2017 0957   ALT 13 08/16/2018 1008   ALT 18 05/17/2017 0957   BILITOT 0.6 08/16/2018 1008   BILITOT 0.47 05/17/2017 0957       All questions were answered. The patient knows to call the clinic with any problems, questions or concerns. No barriers to learning was detected.  I spent 15 minutes  counseling the patient face to face. The total time spent in the appointment was 20 minutes and more than 50% was on counseling and review of test results  [name], MD 09/06/2018 11:06 AM

## 2018-09-06 NOTE — Assessment & Plan Note (Signed)
She has fully recovered from recent laryngitis She will proceed with Keytruda today I recommend her to restart Lenvima at half a dose of 10 mg daily I will call her next week to check on her for toxicity I will see her back again at the end of the month for next dose of treatment

## 2018-09-06 NOTE — Patient Instructions (Signed)
[facility] Discharge Instructions for Patients Receiving Chemotherapy  Today you received the following chemotherapy agents:  Keytruda.  To help prevent nausea and vomiting after your treatment, we encourage you to take your nausea medication as directed.   If you develop nausea and vomiting that is not controlled by your nausea medication, call the clinic.   BELOW ARE SYMPTOMS THAT SHOULD BE REPORTED IMMEDIATELY:  *FEVER GREATER THAN 100.5 F  *CHILLS WITH OR WITHOUT FEVER  NAUSEA AND VOMITING THAT IS NOT CONTROLLED WITH YOUR NAUSEA MEDICATION  *UNUSUAL SHORTNESS OF BREATH  *UNUSUAL BRUISING OR BLEEDING  TENDERNESS IN MOUTH AND THROAT WITH OR WITHOUT PRESENCE OF ULCERS  *URINARY PROBLEMS  *BOWEL PROBLEMS  UNUSUAL RASH Items with * indicate a potential emergency and should be followed up as soon as possible.  Feel free to call the clinic should you have any questions or concerns. The clinic phone number is [phone].  Please show the CHEMO ALERT CARD at check-in to the Emergency Department and triage nurse.    

## 2018-09-06 NOTE — Assessment & Plan Note (Signed)
She has no signs of hand-foot syndrome and has fully recovered since we stopped her Lenvima several weeks ago She will resume at half a dose

## 2018-09-09 ENCOUNTER — Other Ambulatory Visit: Payer: Self-pay | Admitting: Hematology and Oncology

## 2018-09-09 ENCOUNTER — Telehealth: Payer: Self-pay | Admitting: Hematology and Oncology

## 2018-09-09 DIAGNOSIS — C541 Malignant neoplasm of endometrium: Secondary | ICD-10-CM

## 2018-09-09 MED ORDER — LENVATINIB (10 MG DAILY DOSE) 10 MG PO CPPK: 10.0000 mg | ORAL_CAPSULE | Freq: Every day | ORAL | 11 refills | Status: DC

## 2018-09-09 NOTE — Telephone Encounter (Signed)
Scheduled per sch msg. Called and spoke with patient. Informed no availability for Tuesday. Patient is ok with Monday. Confirmed date and time

## 2018-09-13 ENCOUNTER — Other Ambulatory Visit: Payer: Self-pay | Admitting: Hematology and Oncology

## 2018-09-13 ENCOUNTER — Encounter: Payer: Self-pay | Admitting: Hematology and Oncology

## 2018-09-13 MED ORDER — AMLODIPINE BESYLATE 10 MG PO TABS: ORAL_TABLET | ORAL | 1 refills | Status: DC

## 2018-09-13 MED FILL — AMLODIPINE BESYLATE 10 MG T: 10 | 90 days supply | Qty: 90 | Fill #0

## 2018-09-20 ENCOUNTER — Encounter: Payer: Self-pay | Admitting: Hematology and Oncology

## 2018-09-20 ENCOUNTER — Telehealth: Payer: Self-pay

## 2018-09-20 ENCOUNTER — Other Ambulatory Visit: Payer: Self-pay | Admitting: Hematology and Oncology

## 2018-09-20 DIAGNOSIS — I10 Essential (primary) hypertension: Secondary | ICD-10-CM

## 2018-09-20 MED ORDER — LOSARTAN POTASSIUM 50 MG PO TABS: 50.0000 mg | ORAL_TABLET | Freq: Every day | ORAL | 11 refills | Status: DC

## 2018-09-20 MED FILL — LOSARTAN POTASSIUM 50 MG TA: 50 | 30 days supply | Qty: 30 | Fill #0

## 2018-09-20 NOTE — Telephone Encounter (Signed)
Called and given below message. She verbalized understanding. 

## 2018-09-20 NOTE — Telephone Encounter (Signed)
-----   Message from [name], MD sent at 09/20/2018  8:45 AM EDT ----- Regarding: BP Can you remind her to scan her BP to mychart this morning?

## 2018-09-26 ENCOUNTER — Encounter: Payer: Self-pay | Admitting: Hematology and Oncology

## 2018-09-27 ENCOUNTER — Other Ambulatory Visit: Payer: Self-pay | Admitting: Hematology and Oncology

## 2018-09-27 DIAGNOSIS — I10 Essential (primary) hypertension: Secondary | ICD-10-CM

## 2018-09-27 MED ORDER — METOPROLOL TARTRATE 25 MG PO TABS: 25.0000 mg | ORAL_TABLET | Freq: Two times a day (BID) | ORAL | 1 refills | Status: DC

## 2018-09-30 ENCOUNTER — Inpatient Hospital Stay (HOSPITAL_BASED_OUTPATIENT_CLINIC_OR_DEPARTMENT_OTHER): Payer: 59 | Admitting: Hematology and Oncology

## 2018-09-30 ENCOUNTER — Other Ambulatory Visit: Payer: Self-pay

## 2018-09-30 ENCOUNTER — Encounter: Payer: Self-pay | Admitting: Hematology and Oncology

## 2018-09-30 ENCOUNTER — Telehealth: Payer: Self-pay

## 2018-09-30 ENCOUNTER — Inpatient Hospital Stay: Payer: 59

## 2018-09-30 DIAGNOSIS — R5383 Other fatigue: Secondary | ICD-10-CM | POA: Diagnosis not present

## 2018-09-30 DIAGNOSIS — Z90710 Acquired absence of both cervix and uterus: Secondary | ICD-10-CM

## 2018-09-30 DIAGNOSIS — Z79899 Other long term (current) drug therapy: Secondary | ICD-10-CM

## 2018-09-30 DIAGNOSIS — Z923 Personal history of irradiation: Secondary | ICD-10-CM | POA: Diagnosis not present

## 2018-09-30 DIAGNOSIS — I10 Essential (primary) hypertension: Secondary | ICD-10-CM

## 2018-09-30 DIAGNOSIS — E039 Hypothyroidism, unspecified: Secondary | ICD-10-CM

## 2018-09-30 DIAGNOSIS — C770 Secondary and unspecified malignant neoplasm of lymph nodes of head, face and neck: Secondary | ICD-10-CM

## 2018-09-30 DIAGNOSIS — C541 Malignant neoplasm of endometrium: Secondary | ICD-10-CM

## 2018-09-30 DIAGNOSIS — I700 Atherosclerosis of aorta: Secondary | ICD-10-CM

## 2018-09-30 DIAGNOSIS — E032 Hypothyroidism due to medicaments and other exogenous substances: Secondary | ICD-10-CM | POA: Diagnosis not present

## 2018-09-30 DIAGNOSIS — Z5112 Encounter for antineoplastic immunotherapy: Secondary | ICD-10-CM | POA: Diagnosis not present

## 2018-09-30 DIAGNOSIS — Z90722 Acquired absence of ovaries, bilateral: Secondary | ICD-10-CM

## 2018-09-30 DIAGNOSIS — R809 Proteinuria, unspecified: Secondary | ICD-10-CM | POA: Diagnosis not present

## 2018-09-30 DIAGNOSIS — Z7189 Other specified counseling: Secondary | ICD-10-CM

## 2018-09-30 DIAGNOSIS — C786 Secondary malignant neoplasm of retroperitoneum and peritoneum: Secondary | ICD-10-CM

## 2018-09-30 DIAGNOSIS — Z9221 Personal history of antineoplastic chemotherapy: Secondary | ICD-10-CM

## 2018-09-30 LAB — COMPREHENSIVE METABOLIC PANEL
ALT: 17 U/L (ref 0–44)
AST: 22 U/L (ref 15–41)
Albumin: 3.1 g/dL — ABNORMAL LOW (ref 3.5–5.0)
Alkaline Phosphatase: 59 U/L (ref 38–126)
Anion gap: 10 (ref 5–15)
BUN: 25 mg/dL — ABNORMAL HIGH (ref 8–23)
CO2: 25 mmol/L (ref 22–32)
Calcium: 9.1 mg/dL (ref 8.9–10.3)
Chloride: 103 mmol/L (ref 98–111)
Creatinine, Ser: 0.92 mg/dL (ref 0.44–1.00)
GFR calc Af Amer: >60 mL/min (ref >60)
GFR calc non Af Amer: >60 mL/min (ref >60)
Glucose, Bld: 103 mg/dL — ABNORMAL HIGH (ref 70–99)
Potassium: 4.1 mmol/L (ref 3.5–5.1)
Sodium: 138 mmol/L (ref 135–145)
Total Bilirubin: 0.5 mg/dL (ref 0.3–1.2)
Total Protein: 6.8 g/dL (ref 6.5–8.1)

## 2018-09-30 LAB — CBC WITH DIFFERENTIAL/PLATELET
Abs Immature Granulocytes: 0 10*3/uL (ref 0.00–0.07)
Basophils Absolute: 0.1 10*3/uL (ref 0.0–0.1)
Basophils Relative: 1 %
Eosinophils Absolute: 0.2 10*3/uL (ref 0.0–0.5)
Eosinophils Relative: 5 %
HCT: 36.7 % (ref 36.0–46.0)
Hemoglobin: 12.1 g/dL (ref 12.0–15.0)
Immature Granulocytes: 0 %
Lymphocytes Relative: 19 %
Lymphs Abs: 0.8 10*3/uL (ref 0.7–4.0)
MCH: 31.7 pg (ref 26.0–34.0)
MCHC: 33 g/dL (ref 30.0–36.0)
MCV: 96.1 fL (ref 80.0–100.0)
Monocytes Absolute: 0.3 10*3/uL (ref 0.1–1.0)
Monocytes Relative: 7 %
Neutro Abs: 2.8 10*3/uL (ref 1.7–7.7)
Neutrophils Relative %: 68 %
Platelets: 219 10*3/uL (ref 150–400)
RBC: 3.82 MIL/uL — ABNORMAL LOW (ref 3.87–5.11)
RDW: 13 % (ref 11.5–15.5)
WBC: 4.2 10*3/uL (ref 4.0–10.5)
nRBC: 0 % (ref 0.0–0.2)

## 2018-09-30 LAB — TSH: TSH: 6.675 u[IU]/mL — ABNORMAL HIGH (ref 0.308–3.960)

## 2018-09-30 LAB — TOTAL PROTEIN, URINE DIPSTICK: Protein, ur: 300 mg/dL — AB

## 2018-09-30 MED ORDER — SODIUM CHLORIDE 0.9% FLUSH
10.0000 mL | INTRAVENOUS | Status: DC | PRN
  Administered 2018-09-30: 10 mL
  Filled 2018-09-30: qty 10

## 2018-09-30 MED ORDER — SODIUM CHLORIDE 0.9 % IV SOLN
200.0000 mg | Freq: Once | INTRAVENOUS | Status: AC
  Administered 2018-09-30: 200 mg via INTRAVENOUS
  Filled 2018-09-30: qty 8

## 2018-09-30 MED ORDER — SODIUM CHLORIDE 0.9 % IV SOLN
Freq: Once | INTRAVENOUS | Status: AC
  Administered 2018-09-30: 13:00:00 via INTRAVENOUS
  Filled 2018-09-30: qty 250

## 2018-09-30 MED ORDER — HEPARIN SOD (PORK) LOCK FLUSH 100 UNIT/ML IV SOLN
500.0000 [IU] | Freq: Once | INTRAVENOUS | Status: AC | PRN
  Administered 2018-09-30: 14:00:00 500 [IU]
  Filled 2018-09-30: qty 5

## 2018-09-30 MED FILL — busPIRone HCL 10 MG TABS: 10 | 30 days supply | Qty: 60 | Fill #1

## 2018-09-30 NOTE — Telephone Encounter (Signed)
-----   Message from [name], MD sent at 09/30/2018  1:44 PM EDT ----- Regarding: TSH is high PLs let her know Increase levothyroxine to 50 mcg

## 2018-09-30 NOTE — Telephone Encounter (Signed)
Called and left below message and ask her to call the office for questions.

## 2018-09-30 NOTE — Patient Instructions (Signed)
[place] Discharge Instructions for Patients Receiving Chemotherapy  Today you received the following chemotherapy agents Keytruda  To help prevent nausea and vomiting after your treatment, we encourage you to take your nausea medication as directed by MD   If you develop nausea and vomiting that is not controlled by your nausea medication, call the clinic.   BELOW ARE SYMPTOMS THAT SHOULD BE REPORTED IMMEDIATELY:  *FEVER GREATER THAN 100.5 F  *CHILLS WITH OR WITHOUT FEVER  NAUSEA AND VOMITING THAT IS NOT CONTROLLED WITH YOUR NAUSEA MEDICATION  *UNUSUAL SHORTNESS OF BREATH  *UNUSUAL BRUISING OR BLEEDING  TENDERNESS IN MOUTH AND THROAT WITH OR WITHOUT PRESENCE OF ULCERS  *URINARY PROBLEMS  *BOWEL PROBLEMS  UNUSUAL RASH Items with * indicate a potential emergency and should be followed up as soon as possible.  Feel free to call the clinic should you have any questions or concerns. The clinic phone number is [phone].  Please show the [place] at check-in to the Emergency Department and triage nurse.

## 2018-09-30 NOTE — Assessment & Plan Note (Signed)
She has severe, uncontrolled hypertension and proteinuria secondary to lenvatinib We will proceed with pembrolizumab but will hold her lenvatinib I will call her next week for further follow-up

## 2018-10-01 ENCOUNTER — Other Ambulatory Visit: Payer: Self-pay | Admitting: *Deleted

## 2018-10-01 MED ORDER — LEVOTHYROXINE SODIUM 50 MCG PO TABS: 25.0000 ug | ORAL_TABLET | Freq: Every day | ORAL | 0 refills | Status: DC

## 2018-10-01 MED FILL — LEVOTHYROXINE 50 MCG TABLET: 50 | 15 days supply | Qty: 15 | Fill #0

## 2018-10-01 NOTE — Assessment & Plan Note (Signed)
She has required hypothyroidism secondary to pembrolizumab I recommend increasing the dose of levothyroxine and we will continue to monitor her thyroid function carefully

## 2018-10-01 NOTE — Assessment & Plan Note (Signed)
She has severe, uncontrolled hypertension with heavy proteinuria, likely due to lenvatinib We will hold her treatment today We will continue to titrate her blood pressure medication over the next few weeks I will call her next week to check on her blood pressure control and if her blood pressure has stabilized, we will resume lenvatinib

## 2018-10-01 NOTE — Progress Notes (Signed)
[facility] OFFICE PROGRESS NOTE  Patient Care Team: [name], MD as PCP - General (Family Medicine) Reynold Bowen, MD as Consulting Physician (Endocrinology)  ASSESSMENT & PLAN:  Endometrial cancer [facility]) She has severe, uncontrolled hypertension and proteinuria secondary to lenvatinib We will proceed with pembrolizumab but will hold her lenvatinib I will call her next week for further follow-up  Essential hypertension She has severe, uncontrolled hypertension with heavy proteinuria, likely due to lenvatinib We will hold her treatment today We will continue to titrate her blood pressure medication over the next few weeks I will call her next week to check on her blood pressure control and if her blood pressure has stabilized, we will resume lenvatinib  Acquired hypothyroidism She has required hypothyroidism secondary to pembrolizumab I recommend increasing the dose of levothyroxine and we will continue to monitor her thyroid function carefully   No orders of the defined types were placed in this encounter.   INTERVAL HISTORY: Please see below for problem oriented charting. She returns for chemotherapy and follow-up Since last time I saw her, she continues to have high, uncontrolled hypertension She denies headache or changes in vision No leg swelling She also complained of excessive fatigue Denies recent infection, fever or chills No abdominal bloating or changes in bowel habits Overall, she feels unwell while on treatment  SUMMARY OF ONCOLOGIC HISTORY: [employer] One testing done 11-2015 on surgical path from 2014: MS stable TMB low 4 muts/mb ATM [id number] ERBB3 T389K E2H2 rearrangement exon 9 PPP2R1A P179R TP53 [id number]    ER- APPROXIMATELY 25-35% STAINING IN NEOPLASTIC CELLS (INTERMEDIATE)  PR- APPROXIMATELY 25-35% STAINING IN NEOPLASTIC CELLS (STRONG)  Repeat biopsy 04/02/17: ER negative, Her 2  negative  Progressed on Doxil     Endometrial cancer ([place])   06/20/2012 Pathology Results    Biopsy positive for papillary serous carcinoma    06/20/2012 Genetic Testing    [employer] One testing done 11-2015 on surgical path from 2014: MS stable TMB low 4 muts/mb ATM [id number] ERBB3 T389K E2H2 rearrangement exon 9 PPP2R1A P179R TP53 I195N     06/20/2012 Initial Diagnosis    Patient presented to PCP with intermittent vaginal bleeding since ~ Oct 2013, endometrial biopsy 06-20-12 with complex endometrial hyperplasia with atypia    06/26/2012 Imaging    Thickened endometrial lining in a postmenopausal patient experiencing vaginal bleeding. In the setting of post-menopausal bleeding, endometrial sampling is indicated to exclude carcinoma. No focal myometrial abnormalities are seen.  Normal left ovary and non-visualized right ovary    07/30/2012 Surgery    Dr. Polly Cobia performed robotic hysterectomy with bilateral salpingo-oophorectomy, bilateral pelvic lymph node dissection and periaortic lymph node dissection. Intraoperatively on frozen section, the patient was noted to have a large endometrial polyp with changes within the polyp consistent for high-grade malignancy, possibly papillary serous carcinoma. There is no obvious extrauterine disease noted.      07/30/2012 Pathology Results    [phone] SUPPLEMENTAL REPORT  THE ENDOMETRIAL CARCINOMA WAS ANALYZED FOR DNA MISMATCH REPAIR PROTEINS.IMMUNOHISTOCHEMICALLY, THE NEOPLASM RETAINED NUCLEAR EXPRESSION OF 4 GENE PRODUCTS, MLH1, MSH2, MSH6, AND PMS2, INVOLVED IN DNA MISMATCH REPAIR.POSITIVE AND NEGATIVE CONTROLS WORKED APPROPRIATELY.  PER REQUEST, AN ER AND PR ARE PERFORMED ON BLOCK 1I.  ER- APPROXIMATELY 25-35% STAINING IN NEOPLASTIC CELLS (INTERMEDIATE)  PR- APPROXIMATELY 25-35% STAINING IN NEOPLASTIC CELLS (STRONG)  ER AND PR PREDOMINANTLY SHOW STAINING IN THE SEROUS COMPONENT.  DIAGNOSIS  1. UTERUS, CERVIX WITH  BILATERAL FALLOPIAN TUBES AND OVARIES,  HYSTERECTOMY  AND BILATERAL SALPINGO-OOPHORECTOMY:  HIGH GRADE/POORLY DIFFERENTIATED ENDOMETRIAL ADENOCARCINOMA WITH SOLID AND SEROUS PAPILLARY COMPONENTS.  HISTOPATHOLOGIC TYPE:A VARIETY OF PATTERNS WERE PRESENT, ALL OF WHICH SHOULD BE CONSIDERED TO BE HIGH GRADE.SEROUS PAPILLARY CARCINOMA WAS SEEN OCCURRING ADJACENT TO SOLID ENDOMETRIAL CARCINOMA WITH MARKED ANAPLASIA AND GIANT CELLS. SIZE:TUMOR MEASURED AT LEAST 4.8 CM IN GREATEST HORIZONTAL DIMENSION.  GRADE: POORLY DIFFERENTIATED OR HIGH GRADE. DEPTH OF INVASION: NO DEFINITE MYOMETRIAL INVOLVEMENT WAS SEEN.THE TUMOR APPEARED CONFINED TO THE POLYP AS WELL AS SURFACE ENDOMETRIUM. WHERE MYOMETRIAL THICKNESS NOT APPLICABLE. SEROSAL INVOLVEMENT: NOT DEMONSTRATED.  ENDOCERVICAL INVOLVEMENT:NOT DEMONSTRATED. RESECTION MARGINS: FREE OF INVOLVEMENT. EXTRAUTERINE EXTENSION:NOT DEMONSTRATED. ANGIOLYMPHATIC INVASION: NOT DEFINITELY SEEN. TOTAL NODES EXAMINED:22.  PELVIC NODES EXAMINED:20.  PELVIC NODES INVOLVED:0.  PARA-AORTIC NODES 2. EXAMINED:  PARA-AORTIC NODES 0. INVOLVED TNM STAGE: T1A N0 MX. AJCC STAGE GROUPING: IA. FIGO STAGE:IA.    08/26/2012 Imaging    No CT evidence for intra-abdominal or pelvic metastatic disease. Trace free pelvic fluid, presumably postoperative although the date of surgery is not documented in the electronic medical record.    08/26/2012 - 12/13/2012 Chemotherapy    She received 6 cycles of carbo/taxol     10/29/2012 - 11/27/2012 Radiation Therapy    May 27, June 5,  June 11, June 19, November 27, 2012: Proximal vagina 30 Gy in 5 fractions      01/17/2013 Imaging    1.  No evidence of recurrent or metastatic disease. 2.  No acute abnormality involving the abdomen or pelvis. 3.  Mild diffuse hepatic steatosis. 4.  Very small supraumbilical midline anterior abdominal wall hernia  containing fat, unchanged    01/22/2014 Imaging    No evidence for metastatic or recurrent disease. 2. No bowel obstruction.  Normal appendix. 3. Small fat containing hernia, stable in appearance. 4. Status post hysterectomy and bilateral oophorectomy    02/19/2014 Imaging    No pulmonary lesions are identified. The abnormality on the chest x-ray is due to asymmetric left-sided sternoclavicular joint degenerative disease    10/12/2014 Imaging    New mild retroperitoneal lymphadenopathy in the left paraaortic region and proximal left common iliac chain, consistent with metastatic disease. No other sites of metastatic disease identified within the abdomen or pelvis.      11/27/2014 - 02/11/2015 Chemotherapy    She received 4 cycles of carbo/taxol     03/01/2015 PET scan    Single hypermetabolic small retroperitoneal lymph node along the aorta. 2. No evidence of metastatic disease otherwise in the abdomen or pelvis. No evidence local recurrence. 3. Intensely hypermetabolic enlarged nodule adjacent to the RIGHT lobe of thyroid gland. This presumably represents the biopsied lesion in clinician report which was found to be benign thyroid tissue.    04/05/2015 - 05/13/2015 Radiation Therapy    She received 50.4 gray in 28 fractions with simultaneous integrated boost to 56 gray    06/17/2015 Imaging    No acute process or evidence of metastatic disease in the abdomen or pelvis. Resolution of previously described retroperitoneal adenopathy. 2.  Possible constipation. 3. Atherosclerosis.    06/17/2015 Tumor Marker    Patient's tumor was tested for the following markers: CA125 Results of the tumor marker test revealed 33    08/12/2015 Tumor Marker    Patient's tumor was tested for the following markers: CA125 Results of the tumor marker test revealed 52    08/31/2015 PET scan    Development of right paratracheal hypermetabolic adenopathy, consistent with nodal metastasis. 2. The previously described  isolated abdominal retroperitoneal  hypermetabolic node has resolved. 3. Persistent hypermetabolic right thyroid nodule, per report previously biopsied. Correlate with those results. 4.  Possible constipation.    09/09/2015 -  Anti-estrogen oral therapy    She has been receiving alternative treatment between megace and tamoxifen    09/09/2015 Tumor Marker    Patient's tumor was tested for the following markers: CA125 Results of the tumor marker test revealed 37.8    09/20/2015 Procedure    Technically successful ultrasound-guided thyroid aspiration biopsy , dominant right nodule    09/20/2015 Pathology Results    THYROID, RIGHT, FINE NEEDLE ASPIRATION (SPECIMEN 1 OF 1 COLLECTED 09/20/15): FINDINGS CONSISTENT WITH BENIGN THYROID NODULE (BETHESDA CATEGORY II).    10/28/2015 Tumor Marker    Patient's tumor was tested for the following markers: CA125 Results of the tumor marker test revealed 53.2    11/04/2015 Imaging    Stable benign right thyroid nodule    12/16/2015 Tumor Marker    Patient's tumor was tested for the following markers: CA125 Results of the tumor marker test revealed 38.1    03/22/2016 PET scan    Interval progression of hypermetabolic right paratracheal lymph node consistent with metastatic involvement. 2. Stable hypermetabolic right thyroid nodule. Reportedly this has been biopsied in the past.    03/23/2016 Tumor Marker    Patient's tumor was tested for the following markers: CA125 Results of the tumor marker test revealed 62.4    04/13/2016 - 06/01/2016 Radiation Therapy    She received 56 Gy to the chest in 28 fractions     05/09/2016 Imaging    No evidence of left lower extremity deep vein thrombosis. No evidence of a superficial thrombosis of the greater and lesser saphenous veins. Positive for thrombus noted in several varicosities of the calf. No evidence of Baker's cyst on the left.    05/17/2016 Tumor Marker    Patient's tumor was tested for the following markers:  CA125 Results of the tumor marker test revealed 9    06/29/2016 Tumor Marker    Patient's tumor was tested for the following markers: CA125 Results of the tumor marker test revealed 5.9    08/04/2016 Tumor Marker    Patient's tumor was tested for the following markers: CA125 Results of the tumor marker test revealed 6.0    09/12/2016 PET scan    Complete metabolic response to therapy, with resolution of hypermetabolic mediastinal lymphadenopathy since prior exam. No residual or new metastatic disease identified. Stable hypermetabolic right thyroid lobe nodule, which was previously biopsied on 09/20/2015.    03/13/2017 PET scan    1. Solitary focus of recurrent right paratracheal hypermetabolic activity, with a 1.0 cm right lower paratracheal node having a maximum SUV of 9.0. Appearance compatible with recurrent malignancy. 2. Continued hypermetabolic right thyroid nodule, previously biopsied, presumed benign -correlate with prior biopsy results. 3. Other imaging findings of potential clinical significance: Aortic Atherosclerosis (ICD10-I70.0). Mild cardiomegaly. Prominent stool throughout the colon favors constipation.    04/02/2017 Pathology Results    FINE NEEDLE ASPIRATION, ENDOSCOPIC, (EBUS) 4 R NODE (SPECIMEN 1 OF 2 COLLECTED 04/02/17): MALIGNANT CELLS PRESENT, CONSISTENT WITH CARCINOMA. SEE COMMENT. COMMENT: THE MALIGNANT CELLS ARE POSITIVE FOR P53 AND NEGATIVE FOR ESTROGEN RECEPTOR AND TTF-1. THIS PROFILE IS NON-SPECIFIC, BUT THE P53 POSITIVE STAINING IS SUGGESTIVE OF GYNECOLOGIC PRIMARY.     04/11/2017 Procedure    Successful 8 French right internal jugular vein power port placement with its tip at the SVC/RA junction    04/12/2017 Imaging  Normal LV size with mild LV hypertrophy. EF 55-60%. Normal RV size and systolic function. Aortic valve sclerosis without significant stenosis.    04/18/2017 - 06/14/2017 Chemotherapy    The patient had chemotherapy with Doxil     07/10/2017  Imaging    - Left ventricle: The cavity size was normal. There was mild concentric hypertrophy. Systolic function was normal. The estimated ejection fraction was in the range of 55% to 60%. Wall motion was normal; there were no regional wall motion abnormalities. Doppler parameters are consistent with abnormal left ventricular relaxation (grade 1 diastolic dysfunction). - Aortic valve: Noncoronary cusp mobility was mildly restricted. - Mitral valve: There was mild regurgitation. - Left atrium: The atrium was mildly dilated. - Atrial septum: No defect or patent foramen ovale was identified.  Impressions:  - Compared to November 2018, global LV longitudinal strain remains normal (has increased)    07/11/2017 PET scan    Increased size and hypermetabolic activity of 3.3 cm right thyroid lobe nodule. Thyroid carcinoma cannot be excluded. Recommend repeat ultrasound guided fine needle aspiration to exclude thyroid carcinoma.  New adjacent hypermetabolic 10 mm right supraclavicular lymph node, suspicious for lymph node metastasis.  Slight increase in size and hypermetabolic activity of solitary right paratracheal lymph node.  No evidence of abdominal or pelvic metastatic disease.    07/18/2017 Procedure    1. Technically successful ultrasound guided fine needle aspiration of indeterminate hypermetabolic right-sided thyroid nodule/mass. 2. Technically successful ultrasound-guided core needle biopsy of hypermetabolic right lower cervical lymph node.    07/18/2017 Pathology Results    THYROID, FINE NEEDLE ASPIRATION RIGHT (SPECIMEN 1 OF 1, COLLECTED ON 07/18/2017): ATYPIA OF UNDETERMINED SIGNIFICANCE OR FOLLICULAR LESION OF UNDETERMINED SIGNIFICANCE (BETHESDA CATEGORY III). SEE COMMENT. COMMENT: THE SPECIMEN CONSISTS OF SMALL AND MEDIUM SIZED GROUPS OF FOLLICULAR EPITHELIAL CELLS WITH MILD CYTOLOGIC ATYPIA INCLUDING NUCLEAR ENLARGEMENT AND HURTHLE CELL CHANGE. SOME GROUPS ARE ARRANGED AS  MICROFOLLICLES. THERE IS MINIMAL BACKGROUND COLLOID. BASED ON THESE FEATURES, A FOLLICULAR LESION/NEOPLASM CAN NOT BE ENTIRELY RULED OUT. A SPECIMEN WILL BE SENT FOR AFIRMA TESTING.    07/19/2017 Pathology Results    Lymph node, needle/core biopsy - METASTATIC PAPILLARY SEROUS CARCINOMA. - SEE COMMENT. Microscopic Comment Dr. Vicente Males has reviewed the case and concurs with this interpretation    10/15/2017 PET scan    No new or progressive disease. No evidence of abdominal or pelvic metastatic disease.  Stable hypermetabolic right thyroid lobe nodule and adjacent right supraclavicular lymph node.  Decreased size and hypermetabolic activity of solitary right paratracheal lymph node.    01/23/2018 PET scan    1. Overall, no significant change from PET-CT of 3 months ago. There is persistent hypermetabolic activity at the right thoracic inlet and within a right paratracheal mediastinal node. The nodes have not significantly changed in size, although the metabolic activity has minimally increased over this interval. It is uncertain how much of the thoracic inlet metabolic activity is attributed to the thyroid nodule versus adjacent cervical lymph nodes, both previously biopsied. 2. No new hypermetabolic activity within the neck, chest, abdomen or pelvis. No evidence of local recurrence in the pelvis. 3. Stable probable radiation changes in the right lung.    04/16/2018 PET scan    1. Interval increase in metabolic activity of the RIGHT supraclavicular node and RIGHT lower paratracheal mediastinal node with minimal change in size. Findings consistent with persistent and mildly progressed metastatic adenopathy. 2. New hypermetabolic small lymph node in the RIGHT lower paratracheal nodal station  adjacent to the previously followed node. 3. No evidence of local recurrence in the pelvis or new disease elsewhere.    07/30/2018 PET scan    1. Mild interval progression of right supraclavicular,  mediastinal, and right hilar hypermetabolic metastatic disease. There is a new hypermetabolic lymph node in the subcarinal station on today's study. 2. No hypermetabolic disease in the abdomen or pelvis to suggest recurrent/metastases. 3.  Aortic Atherosclerois (ICD10-170.0)     08/09/2018 -  Chemotherapy    The patient had Lenvima since 3/6 and pembrolizumab since 3/13 for chemotherapy treatment. 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      REVIEW OF SYSTEMS:   Constitutional: Denies fevers, chills or abnormal weight loss Eyes: Denies blurriness of vision Ears, nose, mouth, throat, and face: Denies mucositis or sore throat Respiratory: Denies cough, dyspnea or wheezes Cardiovascular: Denies palpitation, chest discomfort or lower extremity swelling Gastrointestinal:  Denies nausea, heartburn or change in bowel habits Skin: Denies abnormal skin rashes Lymphatics: Denies new lymphadenopathy or easy bruising Neurological:Denies numbness, tingling or new weaknesses Behavioral/Psych: Mood is stable, no new changes  All other systems were reviewed with the patient and are negative.  I have reviewed the past medical history, past surgical history, social history and family history with the patient and they are unchanged from previous note.  ALLERGIES:  has No Known Allergies.  MEDICATIONS:  Current Outpatient Medications  Medication Sig Dispense Refill  . amLODipine (NORVASC) 10 MG tablet TAKE 1 TABLET BY MOUTH DAILY. DECREASE SIMVASTATIN TO '20MG'$  DAILY WHILE ON  AMLODIPINE PER MD 90 tablet 1  . Calcium Carb-Cholecalciferol 500-600 MG-UNIT TABS Take 1 tablet by mouth daily.     . Cholecalciferol (VITAMIN D3) 2000 units TABS Take 2,000 Units by mouth daily.     . hydrochlorothiazide (HYDRODIURIL) 25 MG tablet Take 1 tablet (25 mg total) by mouth daily. 30 tablet 9  . lenvatinib 10 mg daily dose (LENVIMA, 10 MG DAILY DOSE,) capsule Take 1 capsule (10 mg total) by mouth daily. 30 capsule 11  . levothyroxine (SYNTHROID, LEVOTHROID) 25 MCG tablet Take 1 tablet (25 mcg total) by mouth daily before breakfast. 30 tablet 3  . LORazepam (ATIVAN) 0.5 MG tablet Take 0.5 mg by mouth 2 (two) times daily as needed.  0  . losartan (COZAAR) 50 MG tablet Take 1 tablet (50 mg total) by mouth daily. 30 tablet 11  . metoprolol tartrate (LOPRESSOR) 25 MG tablet Take 1 tablet (25 mg total) by mouth 2 (two) times daily. 60 tablet 1  . simvastatin (ZOCOR) 40 MG tablet Take 40 mg by mouth every evening.    . tacrolimus (PROTOPIC) 0.1 % ointment Apply 1 application topically daily as needed (rash).     . venlafaxine XR (EFFEXOR-XR) 75 MG 24 hr capsule Take 75 mg by mouth daily with breakfast.      No current facility-administered medications for this visit.     PHYSICAL EXAMINATION: ECOG PERFORMANCE STATUS: 1 - Symptomatic but completely ambulatory  Vitals:   09/30/18 1207  BP: (!) 143/87  Pulse: 72  Resp: 18  Temp: 97.8 F (36.6 C)  SpO2: 97%   Filed Weights   09/30/18 1207  Weight: 157 lb (71.2 kg)    GENERAL:alert, no distress and comfortable SKIN: skin color, texture, turgor are normal, no rashes or significant lesions EYES: normal, Conjunctiva are pink and non-injected, sclera clear OROPHARYNX:no exudate, no erythema and lips, buccal mucosa, and tongue normal  NECK: supple, thyroid normal size, non-tender, without nodularity LYMPH:  no palpable lymphadenopathy in the cervical, axillary or inguinal LUNGS: clear to auscultation and percussion with normal  breathing effort HEART: regular rate & rhythm and no murmurs and no lower extremity edema ABDOMEN:abdomen soft, non-tender and normal bowel sounds Musculoskeletal:no cyanosis of digits and no clubbing  NEURO: alert & oriented x 3 with fluent speech, no focal motor/sensory deficits  LABORATORY DATA:  I have reviewed the data as listed    Component Value Date/Time   NA 138 09/30/2018 1119   NA 141 05/17/2017 0957   K 4.1 09/30/2018 1119   K 3.6 05/17/2017 0957   CL 103 09/30/2018 1119   CL 105 11/19/2012 0848   CO2 25 09/30/2018 1119   CO2 25 05/17/2017 0957   GLUCOSE 103 (H) 09/30/2018 1119   GLUCOSE 85 05/17/2017 0957   GLUCOSE 122 (H) 11/19/2012 0848   BUN 25 (H) 09/30/2018 1119   BUN 15.2 05/17/2017 0957   CREATININE 0.92 09/30/2018 1119   CREATININE 0.8 05/17/2017 0957   CALCIUM 9.1 09/30/2018 1119   CALCIUM 9.6 05/17/2017 0957   PROT 6.8 09/30/2018 1119   PROT 7.0 05/17/2017 0957   ALBUMIN 3.1 (L) 09/30/2018 1119   ALBUMIN 3.9 05/17/2017 0957   AST 22 09/30/2018 1119   AST 25 05/17/2017 0957   ALT 17 09/30/2018 1119   ALT 18 05/17/2017 0957   ALKPHOS 59 09/30/2018 1119   ALKPHOS 57 05/17/2017 0957   BILITOT 0.5 09/30/2018 1119   BILITOT 0.47 05/17/2017 0957   GFRNONAA >60 09/30/2018 1119   GFRAA >60 09/30/2018 1119    No results found for: SPEP, UPEP  Lab Results  Component Value Date   WBC 4.2 09/30/2018   NEUTROABS 2.8 09/30/2018   HGB 12.1 09/30/2018   HCT 36.7 09/30/2018   MCV 96.1 09/30/2018   PLT 219 09/30/2018      Chemistry      Component Value Date/Time   NA 138 09/30/2018 1119   NA 141 05/17/2017 0957   K 4.1 09/30/2018 1119   K 3.6 05/17/2017 0957   CL 103 09/30/2018 1119   CL 105 11/19/2012 0848   CO2 25 09/30/2018 1119   CO2 25 05/17/2017 0957   BUN 25 (H) 09/30/2018 1119   BUN 15.2 05/17/2017 0957   CREATININE 0.92 09/30/2018 1119   CREATININE 0.8 05/17/2017 0957   GLU 158 (H) 01/14/2015 1035      Component Value Date/Time    CALCIUM 9.1 09/30/2018 1119   CALCIUM 9.6 05/17/2017 0957   ALKPHOS 59 09/30/2018 1119   ALKPHOS 57 05/17/2017 0957   AST 22 09/30/2018 1119   AST 25 05/17/2017 0957   ALT 17 09/30/2018 1119   ALT 18 05/17/2017 0957   BILITOT 0.5 09/30/2018 1119   BILITOT 0.47 05/17/2017 0957       All questions were answered. The patient knows to call the clinic with any problems, questions or concerns. No barriers to learning was detected.  I spent 25 minutes counseling the patient face to face. The total time spent in the appointment  was 30 minutes and more than 50% was on counseling and review of test results  [name], MD 10/01/2018 8:24 AM

## 2018-10-01 NOTE — Telephone Encounter (Signed)
Patient called in response to message left yesterday. Patient needs Synthroid refilled. She only has enough to double the dose for 4 days. Refill sent in for 23mcg tablets to [facility]. Rn called to request for prescription mailed.

## 2018-10-07 ENCOUNTER — Other Ambulatory Visit: Payer: Self-pay | Admitting: *Deleted

## 2018-10-07 MED ORDER — LEVOTHYROXINE SODIUM 50 MCG PO TABS: 50.0000 ug | ORAL_TABLET | Freq: Every day | ORAL | 0 refills | Status: DC

## 2018-10-09 ENCOUNTER — Encounter: Payer: Self-pay | Admitting: Hematology and Oncology

## 2018-10-13 MED FILL — LEVOTHYROXINE 50 MCG TABLET: 50 | 30 days supply | Qty: 30 | Fill #0

## 2018-10-14 MED FILL — VENLAFAXINE HCL ER 150 MG C: 150 | 30 days supply | Qty: 60 | Fill #1

## 2018-10-17 DIAGNOSIS — F064 Anxiety disorder due to known physiological condition: Secondary | ICD-10-CM | POA: Diagnosis not present

## 2018-10-18 MED FILL — VENLAFAXINE HCL ER 150 MG C: 150 | 90 days supply | Qty: 180 | Fill #0

## 2018-10-18 MED FILL — busPIRone HCL 10 MG TABS: 10 | 90 days supply | Qty: 180 | Fill #0

## 2018-10-21 ENCOUNTER — Inpatient Hospital Stay: Payer: 59

## 2018-10-21 ENCOUNTER — Other Ambulatory Visit: Payer: Self-pay

## 2018-10-21 ENCOUNTER — Inpatient Hospital Stay: Payer: 59 | Attending: Hematology and Oncology

## 2018-10-21 ENCOUNTER — Inpatient Hospital Stay (HOSPITAL_BASED_OUTPATIENT_CLINIC_OR_DEPARTMENT_OTHER): Payer: 59 | Admitting: Hematology and Oncology

## 2018-10-21 VITALS — BP 116/72 | HR 67 | Temp 97.8°F | Resp 18 | Ht 63.0 in | Wt 156.0 lb

## 2018-10-21 DIAGNOSIS — C770 Secondary and unspecified malignant neoplasm of lymph nodes of head, face and neck: Secondary | ICD-10-CM

## 2018-10-21 DIAGNOSIS — C541 Malignant neoplasm of endometrium: Secondary | ICD-10-CM | POA: Diagnosis not present

## 2018-10-21 DIAGNOSIS — Z79899 Other long term (current) drug therapy: Secondary | ICD-10-CM

## 2018-10-21 DIAGNOSIS — E039 Hypothyroidism, unspecified: Secondary | ICD-10-CM

## 2018-10-21 DIAGNOSIS — Z95828 Presence of other vascular implants and grafts: Secondary | ICD-10-CM

## 2018-10-21 DIAGNOSIS — I10 Essential (primary) hypertension: Secondary | ICD-10-CM | POA: Insufficient documentation

## 2018-10-21 DIAGNOSIS — E041 Nontoxic single thyroid nodule: Secondary | ICD-10-CM | POA: Diagnosis not present

## 2018-10-21 DIAGNOSIS — Z5112 Encounter for antineoplastic immunotherapy: Secondary | ICD-10-CM | POA: Insufficient documentation

## 2018-10-21 DIAGNOSIS — Z7189 Other specified counseling: Secondary | ICD-10-CM

## 2018-10-21 LAB — CBC WITH DIFFERENTIAL/PLATELET
Abs Immature Granulocytes: 0.01 10*3/uL (ref 0.00–0.07)
Basophils Absolute: 0.1 10*3/uL (ref 0.0–0.1)
Basophils Relative: 1 %
Eosinophils Absolute: 0.3 10*3/uL (ref 0.0–0.5)
Eosinophils Relative: 7 %
HCT: 35.3 % — ABNORMAL LOW (ref 36.0–46.0)
Hemoglobin: 11.8 g/dL — ABNORMAL LOW (ref 12.0–15.0)
Immature Granulocytes: 0 %
Lymphocytes Relative: 20 %
Lymphs Abs: 0.8 10*3/uL (ref 0.7–4.0)
MCH: 31.9 pg (ref 26.0–34.0)
MCHC: 33.4 g/dL (ref 30.0–36.0)
MCV: 95.4 fL (ref 80.0–100.0)
Monocytes Absolute: 0.3 10*3/uL (ref 0.1–1.0)
Monocytes Relative: 7 %
Neutro Abs: 2.8 10*3/uL (ref 1.7–7.7)
Neutrophils Relative %: 65 %
Platelets: 166 10*3/uL (ref 150–400)
RBC: 3.7 MIL/uL — ABNORMAL LOW (ref 3.87–5.11)
RDW: 12.4 % (ref 11.5–15.5)
WBC: 4.3 10*3/uL (ref 4.0–10.5)
nRBC: 0 % (ref 0.0–0.2)

## 2018-10-21 LAB — COMPREHENSIVE METABOLIC PANEL
ALT: 18 U/L (ref 0–44)
AST: 25 U/L (ref 15–41)
Albumin: 3.6 g/dL (ref 3.5–5.0)
Alkaline Phosphatase: 58 U/L (ref 38–126)
Anion gap: 8 (ref 5–15)
BUN: 26 mg/dL — ABNORMAL HIGH (ref 8–23)
CO2: 29 mmol/L (ref 22–32)
Calcium: 9.6 mg/dL (ref 8.9–10.3)
Chloride: 102 mmol/L (ref 98–111)
Creatinine, Ser: 0.93 mg/dL (ref 0.44–1.00)
GFR calc Af Amer: >60 mL/min (ref >60)
GFR calc non Af Amer: >60 mL/min (ref >60)
Glucose, Bld: 107 mg/dL — ABNORMAL HIGH (ref 70–99)
Potassium: 3.9 mmol/L (ref 3.5–5.1)
Sodium: 139 mmol/L (ref 135–145)
Total Bilirubin: 0.5 mg/dL (ref 0.3–1.2)
Total Protein: 7.3 g/dL (ref 6.5–8.1)

## 2018-10-21 LAB — TSH: TSH: 3.664 u[IU]/mL (ref 0.308–3.960)

## 2018-10-21 LAB — TOTAL PROTEIN, URINE DIPSTICK: Protein, ur: 100 mg/dL — AB

## 2018-10-21 MED ORDER — SODIUM CHLORIDE 0.9 % IV SOLN
200.0000 mg | Freq: Once | INTRAVENOUS | Status: AC
  Administered 2018-10-21: 200 mg via INTRAVENOUS
  Filled 2018-10-21: qty 8

## 2018-10-21 MED ORDER — SODIUM CHLORIDE 0.9% FLUSH
10.0000 mL | Freq: Once | INTRAVENOUS | Status: AC
  Administered 2018-10-21: 10 mL
  Filled 2018-10-21: qty 10

## 2018-10-21 MED ORDER — HEPARIN SOD (PORK) LOCK FLUSH 100 UNIT/ML IV SOLN
500.0000 [IU] | Freq: Once | INTRAVENOUS | Status: AC | PRN
  Administered 2018-10-21: 500 [IU]
  Filled 2018-10-21: qty 5

## 2018-10-21 MED ORDER — SODIUM CHLORIDE 0.9% FLUSH
10.0000 mL | INTRAVENOUS | Status: DC | PRN
  Administered 2018-10-21 (×2): 10 mL
  Filled 2018-10-21: qty 10

## 2018-10-21 MED ORDER — SODIUM CHLORIDE 0.9 % IV SOLN
Freq: Once | INTRAVENOUS | Status: AC
  Administered 2018-10-21: 13:00:00 via INTRAVENOUS
  Filled 2018-10-21: qty 250

## 2018-10-21 NOTE — Patient Instructions (Signed)
[facility] Discharge Instructions for Patients Receiving Chemotherapy  Today you received the following chemotherapy agents Pembrolizumab (KEYTRUDA).  To help prevent nausea and vomiting after your treatment, we encourage you to take your nausea medication as prescribed.   If you develop nausea and vomiting that is not controlled by your nausea medication, call the clinic.   BELOW ARE SYMPTOMS THAT SHOULD BE REPORTED IMMEDIATELY:  *FEVER GREATER THAN 100.5 F  *CHILLS WITH OR WITHOUT FEVER  NAUSEA AND VOMITING THAT IS NOT CONTROLLED WITH YOUR NAUSEA MEDICATION  *UNUSUAL SHORTNESS OF BREATH  *UNUSUAL BRUISING OR BLEEDING  TENDERNESS IN MOUTH AND THROAT WITH OR WITHOUT PRESENCE OF ULCERS  *URINARY PROBLEMS  *BOWEL PROBLEMS  UNUSUAL RASH Items with * indicate a potential emergency and should be followed up as soon as possible.  Feel free to call the clinic should you have any questions or concerns. The clinic phone number is [phone].  Please show the CHEMO ALERT CARD at check-in to the Emergency Department and triage nurse.  Coronavirus (COVID-19) Are you at risk?  Are you at risk for the Coronavirus (COVID-19)?  To be considered HIGH RISK for Coronavirus (COVID-19), you have to meet the following criteria:  . Traveled to [place], [place], [place], [place] or [place]; or in the [place] to [place], [place], [place], or [place]; and have fever, cough, and shortness of breath within the last 2 weeks of travel OR . Been in close contact with a person diagnosed with COVID-19 within the last 2 weeks and have fever, cough, and shortness of breath . IF YOU DO NOT MEET THESE CRITERIA, YOU ARE CONSIDERED LOW RISK FOR COVID-19.  What to do if you are HIGH RISK for COVID-19?  . If you are having a medical emergency, call 911. . Seek medical care right away. Before you go to a doctor's office, urgent care or emergency department, call ahead and tell  them about your recent travel, contact with someone diagnosed with COVID-19, and your symptoms. You should receive instructions from your physician's office regarding next steps of care.  . When you arrive at healthcare provider, tell the healthcare staff immediately you have returned from visiting [place], [place], [place], [place] or [place]; or traveled in the [place] to [place], [place], [place], or [place]; in the last two weeks or you have been in close contact with a person diagnosed with COVID-19 in the last 2 weeks.   . Tell the health care staff about your symptoms: fever, cough and shortness of breath. . After you have been seen by a medical provider, you will be either: o Tested for (COVID-19) and discharged home on quarantine except to seek medical care if symptoms worsen, and asked to  - Stay home and avoid contact with others until you get your results (4-5 days)  - Avoid travel on public transportation if possible (such as bus, train, or airplane) or o Sent to the Emergency Department by EMS for evaluation, COVID-19 testing, and possible admission depending on your condition and test results.  What to do if you are LOW RISK for COVID-19?  Reduce your risk of any infection by using the same precautions used for avoiding the common cold or flu:  . Wash your hands often with soap and warm water for at least 20 seconds.  If soap and water are not readily available, use an alcohol-based hand sanitizer with at least 60% alcohol.  . If coughing or   sneezing, cover your mouth and nose by coughing or sneezing into the elbow areas of your shirt or coat, into a tissue or into your sleeve (not your hands). . Avoid shaking hands with others and consider head nods or verbal greetings only. . Avoid touching your eyes, nose, or mouth with unwashed hands.  . Avoid close contact with people who are sick. . Avoid places or events with large numbers of people in one location, like concerts or  sporting events. . Carefully consider travel plans you have or are making. . If you are planning any travel outside or inside the [place], visit the CDC's Travelers' Health webpage for the latest health notices. . If you have some symptoms but not all symptoms, continue to monitor at home and seek medical attention if your symptoms worsen. . If you are having a medical emergency, call 911.   ADDITIONAL HEALTHCARE OPTIONS FOR PATIENTS  [facility] / e-Visit: [email or web address]         [facility]: [phone]  [facility]: [phone]                   [facility]: [phone]    

## 2018-10-22 ENCOUNTER — Encounter: Payer: Self-pay | Admitting: Hematology and Oncology

## 2018-10-22 ENCOUNTER — Telehealth: Payer: Self-pay | Admitting: Oncology

## 2018-10-22 MED FILL — LOSARTAN POTASSIUM 50 MG TA: 50 | 30 days supply | Qty: 30 | Fill #1

## 2018-10-22 NOTE — Progress Notes (Signed)
[facility] OFFICE PROGRESS NOTE  Patient Care Team: [name], MD as PCP - General (Family Medicine) Reynold Bowen, MD as Consulting Physician (Endocrinology)  ASSESSMENT & PLAN:  Endometrial cancer [facility]) She is able to tolerate reduced dose lenvatinib well along with Beryle Flock So far, her only side effects are related to hypertension which is currently well controlled We will proceed with treatment as scheduled today I will order a PET CT scan for objective assessment of response to therapy next week and I will see her back to review everything  Essential hypertension Her blood pressure is well controlled now with reduced dose lenvatinib She will continue blood pressure monitoring and current prescription blood pressure medications.  Acquired hypothyroidism Her TSH is currently within normal range She will continue current prescription levothyroxine and I will adjust her medication accordingly   Orders Placed This Encounter  Procedures  . NM PET Image Restag (PS) Skull Base To Thigh    Standing Status:   Future    Standing Expiration Date:   10/21/2019    Order Specific Question:   If indicated for the ordered procedure, I authorize the administration of a radiopharmaceutical per Radiology protocol    Answer:   Yes    Order Specific Question:   Preferred imaging location?    Answer:   [facility]    Order Specific Question:   Radiology Contrast Protocol - do NOT remove file path    Answer:   [email or web address]    INTERVAL HISTORY: Please see below for problem oriented charting. She returns for cycle 4 of chemotherapy With reduced dose lenvatinib, she felt better Denies dizziness or headache No ankle swelling Her blood pressure control at home is satisfactory No recent cough, chest pain or shortness of breath No new lymphadenopathy  SUMMARY OF ONCOLOGIC HISTORY: Oncology History   [employer] testing done 11-2015 on  surgical path from 2014: MS stable TMB low 4 muts/mb ATM [id number] ERBB3 T389K E2H2 rearrangement exon 9 PPP2R1A P179R TP53 I195N    ER- APPROXIMATELY 25-35% STAINING IN NEOPLASTIC CELLS (INTERMEDIATE)  PR- APPROXIMATELY 25-35% STAINING IN NEOPLASTIC CELLS (STRONG)  Repeat biopsy 04/02/17: ER negative, Her 2 negative  Progressed on Doxil     Endometrial cancer ([place])   06/20/2012 Pathology Results    Biopsy positive for papillary serous carcinoma    06/20/2012 Genetic Testing    [employer] One testing done 11-2015 on surgical path from 2014: MS stable TMB low 4 muts/mb ATM [id number] ERBB3 T389K E2H2 rearrangement exon 9 PPP2R1A P179R TP53 I195N     06/20/2012 Initial Diagnosis    Patient presented to PCP with intermittent vaginal bleeding since ~ Oct 2013, endometrial biopsy 06-20-12 with complex endometrial hyperplasia with atypia    06/26/2012 Imaging    Thickened endometrial lining in a postmenopausal patient experiencing vaginal bleeding. In the setting of post-menopausal bleeding, endometrial sampling is indicated to exclude carcinoma. No focal myometrial abnormalities are seen.  Normal left ovary and non-visualized right ovary    07/30/2012 Surgery    Dr. Polly Cobia performed robotic hysterectomy with bilateral salpingo-oophorectomy, bilateral pelvic lymph node dissection and periaortic lymph node dissection. Intraoperatively on frozen section, the patient was noted to have a large endometrial polyp with changes within the polyp consistent for high-grade malignancy, possibly papillary serous carcinoma. There is no obvious extrauterine disease noted.      07/30/2012 Pathology Results    [id number] SUPPLEMENTAL REPORT  THE ENDOMETRIAL CARCINOMA WAS ANALYZED FOR DNA MISMATCH REPAIR PROTEINS.IMMUNOHISTOCHEMICALLY, THE NEOPLASM  RETAINED NUCLEAR EXPRESSION OF 4 GENE PRODUCTS, MLH1, MSH2, MSH6, AND PMS2, INVOLVED IN DNA MISMATCH REPAIR.POSITIVE AND  NEGATIVE CONTROLS WORKED APPROPRIATELY.  PER REQUEST, AN ER AND PR ARE PERFORMED ON BLOCK 1I.  ER- APPROXIMATELY 25-35% STAINING IN NEOPLASTIC CELLS (INTERMEDIATE)  PR- APPROXIMATELY 25-35% STAINING IN NEOPLASTIC CELLS (STRONG)  ER AND PR PREDOMINANTLY SHOW STAINING IN THE SEROUS COMPONENT.  DIAGNOSIS  1. UTERUS, CERVIX WITH BILATERAL FALLOPIAN TUBES AND OVARIES,  HYSTERECTOMY AND BILATERAL SALPINGO-OOPHORECTOMY:  HIGH GRADE/POORLY DIFFERENTIATED ENDOMETRIAL ADENOCARCINOMA WITH SOLID AND SEROUS PAPILLARY COMPONENTS.  HISTOPATHOLOGIC TYPE:A VARIETY OF PATTERNS WERE PRESENT, ALL OF WHICH SHOULD BE CONSIDERED TO BE HIGH GRADE.SEROUS PAPILLARY CARCINOMA WAS SEEN OCCURRING ADJACENT TO SOLID ENDOMETRIAL CARCINOMA WITH MARKED ANAPLASIA AND GIANT CELLS. SIZE:TUMOR MEASURED AT LEAST 4.8 CM IN GREATEST HORIZONTAL DIMENSION.  GRADE: POORLY DIFFERENTIATED OR HIGH GRADE. DEPTH OF INVASION: NO DEFINITE MYOMETRIAL INVOLVEMENT WAS SEEN.THE TUMOR APPEARED CONFINED TO THE POLYP AS WELL AS SURFACE ENDOMETRIUM. WHERE MYOMETRIAL THICKNESS NOT APPLICABLE. SEROSAL INVOLVEMENT: NOT DEMONSTRATED.  ENDOCERVICAL INVOLVEMENT:NOT DEMONSTRATED. RESECTION MARGINS: FREE OF INVOLVEMENT. EXTRAUTERINE EXTENSION:NOT DEMONSTRATED. ANGIOLYMPHATIC INVASION: NOT DEFINITELY SEEN. TOTAL NODES EXAMINED:22.  PELVIC NODES EXAMINED:20.  PELVIC NODES INVOLVED:0.  PARA-AORTIC NODES 2. EXAMINED:  PARA-AORTIC NODES 0. INVOLVED TNM STAGE: T1A N0 MX. AJCC STAGE GROUPING: IA. FIGO STAGE:IA.    08/26/2012 Imaging    No CT evidence for intra-abdominal or pelvic metastatic disease. Trace free pelvic fluid, presumably postoperative although the date of surgery is not documented in the electronic medical record.    08/26/2012 - 12/13/2012 Chemotherapy    She received 6 cycles of carbo/taxol     10/29/2012 - 11/27/2012 Radiation Therapy    May  27, June 5,  June 11, June 19, November 27, 2012: Proximal vagina 30 Gy in 5 fractions      01/17/2013 Imaging    1.  No evidence of recurrent or metastatic disease. 2.  No acute abnormality involving the abdomen or pelvis. 3.  Mild diffuse hepatic steatosis. 4.  Very small supraumbilical midline anterior abdominal wall hernia containing fat, unchanged    01/22/2014 Imaging    No evidence for metastatic or recurrent disease. 2. No bowel obstruction.  Normal appendix. 3. Small fat containing hernia, stable in appearance. 4. Status post hysterectomy and bilateral oophorectomy    02/19/2014 Imaging    No pulmonary lesions are identified. The abnormality on the chest x-ray is due to asymmetric left-sided sternoclavicular joint degenerative disease    10/12/2014 Imaging    New mild retroperitoneal lymphadenopathy in the left paraaortic region and proximal left common iliac chain, consistent with metastatic disease. No other sites of metastatic disease identified within the abdomen or pelvis.      11/27/2014 - 02/11/2015 Chemotherapy    She received 4 cycles of carbo/taxol     03/01/2015 PET scan    Single hypermetabolic small retroperitoneal lymph node along the aorta. 2. No evidence of metastatic disease otherwise in the abdomen or pelvis. No evidence local recurrence. 3. Intensely hypermetabolic enlarged nodule adjacent to the RIGHT lobe of thyroid gland. This presumably represents the biopsied lesion in clinician report which was found to be benign thyroid tissue.    04/05/2015 - 05/13/2015 Radiation Therapy    She received 50.4 gray in 28 fractions with simultaneous integrated boost to 56 gray    06/17/2015 Imaging    No acute process or evidence of metastatic disease in the abdomen or pelvis. Resolution of previously described retroperitoneal adenopathy. 2.  Possible constipation. 3. Atherosclerosis.  06/17/2015 Tumor Marker    Patient's tumor was tested for the following markers: CA125 Results  of the tumor marker test revealed 33    08/12/2015 Tumor Marker    Patient's tumor was tested for the following markers: CA125 Results of the tumor marker test revealed 52    08/31/2015 PET scan    Development of right paratracheal hypermetabolic adenopathy, consistent with nodal metastasis. 2. The previously described isolated abdominal retroperitoneal hypermetabolic node has resolved. 3. Persistent hypermetabolic right thyroid nodule, per report previously biopsied. Correlate with those results. 4.  Possible constipation.    09/09/2015 -  Anti-estrogen oral therapy    She has been receiving alternative treatment between megace and tamoxifen    09/09/2015 Tumor Marker    Patient's tumor was tested for the following markers: CA125 Results of the tumor marker test revealed 37.8    09/20/2015 Procedure    Technically successful ultrasound-guided thyroid aspiration biopsy , dominant right nodule    09/20/2015 Pathology Results    THYROID, RIGHT, FINE NEEDLE ASPIRATION (SPECIMEN 1 OF 1 COLLECTED 09/20/15): FINDINGS CONSISTENT WITH BENIGN THYROID NODULE (BETHESDA CATEGORY II).    10/28/2015 Tumor Marker    Patient's tumor was tested for the following markers: CA125 Results of the tumor marker test revealed 53.2    11/04/2015 Imaging    Stable benign right thyroid nodule    12/16/2015 Tumor Marker    Patient's tumor was tested for the following markers: CA125 Results of the tumor marker test revealed 38.1    03/22/2016 PET scan    Interval progression of hypermetabolic right paratracheal lymph node consistent with metastatic involvement. 2. Stable hypermetabolic right thyroid nodule. Reportedly this has been biopsied in the past.    03/23/2016 Tumor Marker    Patient's tumor was tested for the following markers: CA125 Results of the tumor marker test revealed 62.4    04/13/2016 - 06/01/2016 Radiation Therapy    She received 56 Gy to the chest in 28 fractions     05/09/2016 Imaging    No  evidence of left lower extremity deep vein thrombosis. No evidence of a superficial thrombosis of the greater and lesser saphenous veins. Positive for thrombus noted in several varicosities of the calf. No evidence of Baker's cyst on the left.    05/17/2016 Tumor Marker    Patient's tumor was tested for the following markers: CA125 Results of the tumor marker test revealed 9    06/29/2016 Tumor Marker    Patient's tumor was tested for the following markers: CA125 Results of the tumor marker test revealed 5.9    08/04/2016 Tumor Marker    Patient's tumor was tested for the following markers: CA125 Results of the tumor marker test revealed 6.0    09/12/2016 PET scan    Complete metabolic response to therapy, with resolution of hypermetabolic mediastinal lymphadenopathy since prior exam. No residual or new metastatic disease identified. Stable hypermetabolic right thyroid lobe nodule, which was previously biopsied on 09/20/2015.    03/13/2017 PET scan    1. Solitary focus of recurrent right paratracheal hypermetabolic activity, with a 1.0 cm right lower paratracheal node having a maximum SUV of 9.0. Appearance compatible with recurrent malignancy. 2. Continued hypermetabolic right thyroid nodule, previously biopsied, presumed benign -correlate with prior biopsy results. 3. Other imaging findings of potential clinical significance: Aortic Atherosclerosis (ICD10-I70.0). Mild cardiomegaly. Prominent stool throughout the colon favors constipation.    04/02/2017 Pathology Results    FINE NEEDLE ASPIRATION, ENDOSCOPIC, (EBUS) 4 R NODE (SPECIMEN  1 OF 2 COLLECTED 04/02/17): MALIGNANT CELLS PRESENT, CONSISTENT WITH CARCINOMA. SEE COMMENT. COMMENT: THE MALIGNANT CELLS ARE POSITIVE FOR P53 AND NEGATIVE FOR ESTROGEN RECEPTOR AND TTF-1. THIS PROFILE IS NON-SPECIFIC, BUT THE P53 POSITIVE STAINING IS SUGGESTIVE OF GYNECOLOGIC PRIMARY.     04/11/2017 Procedure    Successful 8 French right internal jugular vein  power port placement with its tip at the SVC/RA junction    04/12/2017 Imaging    Normal LV size with mild LV hypertrophy. EF 55-60%. Normal RV size and systolic function. Aortic valve sclerosis without significant stenosis.    04/18/2017 - 06/14/2017 Chemotherapy    The patient had chemotherapy with Doxil     07/10/2017 Imaging    - Left ventricle: The cavity size was normal. There was mild concentric hypertrophy. Systolic function was normal. The estimated ejection fraction was in the range of 55% to 60%. Wall motion was normal; there were no regional wall motion abnormalities. Doppler parameters are consistent with abnormal left ventricular relaxation (grade 1 diastolic dysfunction). - Aortic valve: Noncoronary cusp mobility was mildly restricted. - Mitral valve: There was mild regurgitation. - Left atrium: The atrium was mildly dilated. - Atrial septum: No defect or patent foramen ovale was identified.  Impressions:  - Compared to November 2018, global LV longitudinal strain remains normal (has increased)    07/11/2017 PET scan    Increased size and hypermetabolic activity of 3.3 cm right thyroid lobe nodule. Thyroid carcinoma cannot be excluded. Recommend repeat ultrasound guided fine needle aspiration to exclude thyroid carcinoma.  New adjacent hypermetabolic 10 mm right supraclavicular lymph node, suspicious for lymph node metastasis.  Slight increase in size and hypermetabolic activity of solitary right paratracheal lymph node.  No evidence of abdominal or pelvic metastatic disease.    07/18/2017 Procedure    1. Technically successful ultrasound guided fine needle aspiration of indeterminate hypermetabolic right-sided thyroid nodule/mass. 2. Technically successful ultrasound-guided core needle biopsy of hypermetabolic right lower cervical lymph node.    07/18/2017 Pathology Results    THYROID, FINE NEEDLE ASPIRATION RIGHT (SPECIMEN 1 OF 1, COLLECTED ON 07/18/2017): ATYPIA OF  UNDETERMINED SIGNIFICANCE OR FOLLICULAR LESION OF UNDETERMINED SIGNIFICANCE (BETHESDA CATEGORY III). SEE COMMENT. COMMENT: THE SPECIMEN CONSISTS OF SMALL AND MEDIUM SIZED GROUPS OF FOLLICULAR EPITHELIAL CELLS WITH MILD CYTOLOGIC ATYPIA INCLUDING NUCLEAR ENLARGEMENT AND HURTHLE CELL CHANGE. SOME GROUPS ARE ARRANGED AS MICROFOLLICLES. THERE IS MINIMAL BACKGROUND COLLOID. BASED ON THESE FEATURES, A FOLLICULAR LESION/NEOPLASM CAN NOT BE ENTIRELY RULED OUT. A SPECIMEN WILL BE SENT FOR AFIRMA TESTING.    07/19/2017 Pathology Results    Lymph node, needle/core biopsy - METASTATIC PAPILLARY SEROUS CARCINOMA. - SEE COMMENT. Microscopic Comment Dr. Vicente Males has reviewed the case and concurs with this interpretation    10/15/2017 PET scan    No new or progressive disease. No evidence of abdominal or pelvic metastatic disease.  Stable hypermetabolic right thyroid lobe nodule and adjacent right supraclavicular lymph node.  Decreased size and hypermetabolic activity of solitary right paratracheal lymph node.    01/23/2018 PET scan    1. Overall, no significant change from PET-CT of 3 months ago. There is persistent hypermetabolic activity at the right thoracic inlet and within a right paratracheal mediastinal node. The nodes have not significantly changed in size, although the metabolic activity has minimally increased over this interval. It is uncertain how much of the thoracic inlet metabolic activity is attributed to the thyroid nodule versus adjacent cervical lymph nodes, both previously biopsied. 2. No new hypermetabolic activity within  the neck, chest, abdomen or pelvis. No evidence of local recurrence in the pelvis. 3. Stable probable radiation changes in the right lung.    04/16/2018 PET scan    1. Interval increase in metabolic activity of the RIGHT supraclavicular node and RIGHT lower paratracheal mediastinal node with minimal change in size. Findings consistent with persistent and mildly  progressed metastatic adenopathy. 2. New hypermetabolic small lymph node in the RIGHT lower paratracheal nodal station adjacent to the previously followed node. 3. No evidence of local recurrence in the pelvis or new disease elsewhere.    07/30/2018 PET scan    1. Mild interval progression of right supraclavicular, mediastinal, and right hilar hypermetabolic metastatic disease. There is a new hypermetabolic lymph node in the subcarinal station on today's study. 2. No hypermetabolic disease in the abdomen or pelvis to suggest recurrent/metastases. 3.  Aortic Atherosclerois (ICD10-170.0)     08/09/2018 -  Chemotherapy    The patient had Lenvima since 3/6 and pembrolizumab since 3/13 for chemotherapy treatment. 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      REVIEW OF SYSTEMS:   Constitutional: Denies fevers, chills or abnormal weight loss Eyes: Denies blurriness of vision Ears, nose, mouth, throat, and face: Denies mucositis or sore throat Respiratory: Denies cough, dyspnea or wheezes Cardiovascular: Denies palpitation, chest discomfort or lower extremity swelling Gastrointestinal:  Denies nausea, heartburn or change in bowel habits Skin: Denies abnormal skin rashes Lymphatics: Denies new lymphadenopathy or easy bruising Neurological:Denies numbness, tingling or new weaknesses Behavioral/Psych: Mood is stable, no new changes  All other systems were reviewed with the patient and are negative.  I have reviewed the past medical history, past  surgical history, social history and family history with the patient and they are unchanged from previous note.  ALLERGIES:  has No Known Allergies.  MEDICATIONS:  Current Outpatient Medications  Medication Sig Dispense Refill  . amLODipine (NORVASC) 10 MG tablet TAKE 1 TABLET BY MOUTH DAILY. DECREASE SIMVASTATIN TO 20MG DAILY WHILE ON AMLODIPINE PER MD 90 tablet 1  . Calcium Carb-Cholecalciferol 500-600 MG-UNIT TABS Take 1 tablet by mouth daily.     . Cholecalciferol (VITAMIN D3) 2000 units TABS Take 2,000 Units by mouth daily.     . hydrochlorothiazide (HYDRODIURIL) 25 MG tablet Take 1 tablet (25 mg total) by mouth daily. 30 tablet 9  . lenvatinib 10 mg daily dose (LENVIMA, 10 MG DAILY DOSE,) capsule Take 1 capsule (10 mg total) by mouth daily. 30 capsule 11  . levothyroxine (SYNTHROID) 50 MCG tablet Take 1 tablet (50 mcg total) by mouth daily before breakfast. 30 tablet 0  . LORazepam (ATIVAN) 0.5 MG tablet Take 0.5 mg by mouth 2 (two) times daily as needed.  0  . losartan (COZAAR) 50 MG tablet Take 1 tablet (50 mg total) by mouth daily. 30 tablet 11  . metoprolol tartrate (LOPRESSOR) 25 MG tablet Take 1 tablet (25 mg total) by mouth 2 (two) times daily. 60 tablet 1  . simvastatin (ZOCOR) 40 MG tablet Take 40 mg by mouth every evening.    Marland Kitchen  tacrolimus (PROTOPIC) 0.1 % ointment Apply 1 application topically daily as needed (rash).     . venlafaxine XR (EFFEXOR-XR) 75 MG 24 hr capsule Take 75 mg by mouth daily with breakfast.      No current facility-administered medications for this visit.     PHYSICAL EXAMINATION: ECOG PERFORMANCE STATUS: 1 - Symptomatic but completely ambulatory  Vitals:   10/21/18 1117  BP: 116/72  Pulse: 67  Resp: 18  Temp: 97.8 F (36.6 C)  SpO2: 99%   Filed Weights   10/21/18 1117  Weight: 156 lb (70.8 kg)    GENERAL:alert, no distress and comfortable SKIN: skin color, texture, turgor are normal, no rashes or significant lesions EYES: normal,  Conjunctiva are pink and non-injected, sclera clear OROPHARYNX:no exudate, no erythema and lips, buccal mucosa, and tongue normal  NECK: supple, thyroid normal size, non-tender, without nodularity LYMPH:  no palpable lymphadenopathy in the cervical, axillary or inguinal LUNGS: clear to auscultation and percussion with normal breathing effort HEART: regular rate & rhythm and no murmurs and no lower extremity edema ABDOMEN:abdomen soft, non-tender and normal bowel sounds Musculoskeletal:no cyanosis of digits and no clubbing  NEURO: alert & oriented x 3 with fluent speech, no focal motor/sensory deficits  LABORATORY DATA:  I have reviewed the data as listed    Component Value Date/Time   NA 139 10/21/2018 1036   NA 141 05/17/2017 0957   K 3.9 10/21/2018 1036   K 3.6 05/17/2017 0957   CL 102 10/21/2018 1036   CL 105 11/19/2012 0848   CO2 29 10/21/2018 1036   CO2 25 05/17/2017 0957   GLUCOSE 107 (H) 10/21/2018 1036   GLUCOSE 85 05/17/2017 0957   GLUCOSE 122 (H) 11/19/2012 0848   BUN 26 (H) 10/21/2018 1036   BUN 15.2 05/17/2017 0957   CREATININE 0.93 10/21/2018 1036   CREATININE 0.8 05/17/2017 0957   CALCIUM 9.6 10/21/2018 1036   CALCIUM 9.6 05/17/2017 0957   PROT 7.3 10/21/2018 1036   PROT 7.0 05/17/2017 0957   ALBUMIN 3.6 10/21/2018 1036   ALBUMIN 3.9 05/17/2017 0957   AST 25 10/21/2018 1036   AST 25 05/17/2017 0957   ALT 18 10/21/2018 1036   ALT 18 05/17/2017 0957   ALKPHOS 58 10/21/2018 1036   ALKPHOS 57 05/17/2017 0957   BILITOT 0.5 10/21/2018 1036   BILITOT 0.47 05/17/2017 0957   GFRNONAA >60 10/21/2018 1036   GFRAA >60 10/21/2018 1036    No results found for: SPEP, UPEP  Lab Results  Component Value Date   WBC 4.3 10/21/2018   NEUTROABS 2.8 10/21/2018   HGB 11.8 (L) 10/21/2018   HCT 35.3 (L) 10/21/2018   MCV 95.4 10/21/2018   PLT 166 10/21/2018      Chemistry      Component Value Date/Time   NA 139 10/21/2018 1036   NA 141 05/17/2017 0957   K 3.9  10/21/2018 1036   K 3.6 05/17/2017 0957   CL 102 10/21/2018 1036   CL 105 11/19/2012 0848   CO2 29 10/21/2018 1036   CO2 25 05/17/2017 0957   BUN 26 (H) 10/21/2018 1036   BUN 15.2 05/17/2017 0957   CREATININE 0.93 10/21/2018 1036   CREATININE 0.8 05/17/2017 0957   GLU 158 (H) 01/14/2015 1035      Component Value Date/Time   CALCIUM 9.6 10/21/2018 1036   CALCIUM 9.6 05/17/2017 0957   ALKPHOS 58 10/21/2018 1036   ALKPHOS 57 05/17/2017 0957   AST 25 10/21/2018 1036  AST 25 05/17/2017 0957   ALT 18 10/21/2018 1036   ALT 18 05/17/2017 0957   BILITOT 0.5 10/21/2018 1036   BILITOT 0.47 05/17/2017 0957       All questions were answered. The patient knows to call the clinic with any problems, questions or concerns. No barriers to learning was detected.  I spent 25 minutes counseling the patient face to face. The total time spent in the appointment was 30 minutes and more than 50% was on counseling and review of test results  [name], MD 10/22/2018 7:19 AM

## 2018-10-22 NOTE — Assessment & Plan Note (Signed)
Her TSH is currently within normal range She will continue current prescription levothyroxine and I will adjust her medication accordingly

## 2018-10-22 NOTE — Assessment & Plan Note (Signed)
She is able to tolerate reduced dose lenvatinib well along with Beryle Flock So far, her only side effects are related to hypertension which is currently well controlled We will proceed with treatment as scheduled today I will order a PET CT scan for objective assessment of response to therapy next week and I will see her back to review everything

## 2018-10-22 NOTE — Telephone Encounter (Signed)
Left a message for Erik regarding PET scan apt on 10/31/18 at 8 am (7:30 am arrival at [facility], NPO 6 hours prior and no carbs 12 hours prior to scan).  Requested a return call to confirm.

## 2018-10-22 NOTE — Assessment & Plan Note (Signed)
Her blood pressure is well controlled now with reduced dose lenvatinib She will continue blood pressure monitoring and current prescription blood pressure medications.

## 2018-10-23 ENCOUNTER — Telehealth: Payer: Self-pay | Admitting: Hematology and Oncology

## 2018-10-23 NOTE — Telephone Encounter (Signed)
Tried to reach regarding schedule °

## 2018-10-25 ENCOUNTER — Telehealth: Payer: Self-pay

## 2018-10-25 NOTE — Telephone Encounter (Signed)
Oral Oncology Patient Advocate Encounter  Received notification from [place] that prior authorization for Lenvima 10mg  is required.  PA submitted over the phone [phone], Reference for the call is the representatives name, Aldona Bar Status is pending  [facility] will continue to follow.  [place] Patient [place] Phone [phone] Fax [phone] 10/25/2018    4:51 PM

## 2018-10-26 MED FILL — LENVIMA 10 MG DAILY DOSE: 10 | 30 days supply | Qty: 30 | Fill #0

## 2018-10-29 MED FILL — METOPROLOL TARTRATE 25 MG T: 25 | 30 days supply | Qty: 60 | Fill #0

## 2018-10-29 NOTE — Telephone Encounter (Signed)
Oral Oncology Patient Advocate Encounter  Prior Authorization for Lenvima 10mg  has been approved.    PA# [id number] Effective dates: 10/26/18 through 10/25/19  [facility] will continue to follow.   [place] Patient [place] Phone [phone] Fax [phone] 10/29/2018    8:54 AM

## 2018-10-31 ENCOUNTER — Ambulatory Visit (HOSPITAL_COMMUNITY)
Admission: RE | Admit: 2018-10-31 | Discharge: 2018-10-31 | Disposition: A | Discharge status: Home / Self Care | Payer: 59 | Source: Ambulatory Visit | Attending: Hematology and Oncology | Admitting: Hematology and Oncology

## 2018-10-31 ENCOUNTER — Other Ambulatory Visit: Payer: Self-pay

## 2018-10-31 DIAGNOSIS — C541 Malignant neoplasm of endometrium: Secondary | ICD-10-CM | POA: Insufficient documentation

## 2018-10-31 LAB — GLUCOSE, CAPILLARY: Glucose-Capillary: 79 mg/dL (ref 70–99)

## 2018-10-31 MED ORDER — FLUDEOXYGLUCOSE F - 18 (FDG) INJECTION
8.9000 | Freq: Once | INTRAVENOUS | Status: AC | PRN
  Administered 2018-10-31: 8.9 via INTRAVENOUS

## 2018-11-01 ENCOUNTER — Other Ambulatory Visit: Payer: Self-pay

## 2018-11-01 ENCOUNTER — Inpatient Hospital Stay (HOSPITAL_BASED_OUTPATIENT_CLINIC_OR_DEPARTMENT_OTHER): Payer: 59 | Admitting: Hematology and Oncology

## 2018-11-01 ENCOUNTER — Encounter: Payer: Self-pay | Admitting: Hematology and Oncology

## 2018-11-01 DIAGNOSIS — E041 Nontoxic single thyroid nodule: Secondary | ICD-10-CM | POA: Diagnosis not present

## 2018-11-01 DIAGNOSIS — E039 Hypothyroidism, unspecified: Secondary | ICD-10-CM | POA: Diagnosis not present

## 2018-11-01 DIAGNOSIS — I10 Essential (primary) hypertension: Secondary | ICD-10-CM

## 2018-11-01 DIAGNOSIS — C541 Malignant neoplasm of endometrium: Secondary | ICD-10-CM | POA: Diagnosis not present

## 2018-11-01 DIAGNOSIS — Z79899 Other long term (current) drug therapy: Secondary | ICD-10-CM

## 2018-11-01 DIAGNOSIS — Z5112 Encounter for antineoplastic immunotherapy: Secondary | ICD-10-CM | POA: Diagnosis not present

## 2018-11-01 NOTE — Progress Notes (Signed)
[place] OFFICE PROGRESS NOTE  Patient Care Team: [name], MD as PCP - General (Family Medicine) Reynold Bowen, MD as Consulting Physician (Endocrinology)  ASSESSMENT & PLAN:  Endometrial cancer [facility]) I have reviewed imaging study with the patient's She has near complete resolution of lymphadenopathy in her chest The right neck persistent uptake is stable She will continue a few more months of treatment with reduced dose lenvatinib  Essential hypertension Her blood pressure control is satisfactory She will continue the same She is instructed to monitor her blood pressure closely.   No orders of the defined types were placed in this encounter.   INTERVAL HISTORY: Please see below for problem oriented charting. She returns for further follow-up and review of imaging study Since her last time I saw her, she feels fine Denies chest pain or shortness of breath Blood pressure control is stable She tolerated reduced to lenvatinib well  SUMMARY OF ONCOLOGIC HISTORY: [place] [facility] testing done 11-2015 on surgical path from 2014: MS stable TMB low 4 muts/mb ATM [id number] ERBB3 T389K E2H2 rearrangement exon 9 PPP2R1A P179R TP53 I195N    ER- APPROXIMATELY 25-35% STAINING IN NEOPLASTIC CELLS (INTERMEDIATE)  PR- APPROXIMATELY 25-35% STAINING IN NEOPLASTIC CELLS (STRONG)  Repeat biopsy 04/02/17: ER negative, Her 2 negative  Progressed on Doxil     Endometrial cancer ([place])   06/20/2012 Pathology Results    Biopsy positive for papillary serous carcinoma    06/20/2012 Genetic Testing    [facility] testing done 11-2015 on surgical path from 2014: MS stable TMB low 4 muts/mb ATM [id number] ERBB3 T389K E2H2 rearrangement exon 9 PPP2R1A P179R TP53 I195N     06/20/2012 Initial Diagnosis    Patient presented to PCP with intermittent vaginal bleeding since ~ Oct 2013, endometrial biopsy 06-20-12 with complex  endometrial hyperplasia with atypia    06/26/2012 Imaging    Thickened endometrial lining in a postmenopausal patient experiencing vaginal bleeding. In the setting of post-menopausal bleeding, endometrial sampling is indicated to exclude carcinoma. No focal myometrial abnormalities are seen.  Normal left ovary and non-visualized right ovary    07/30/2012 Surgery    Dr. Polly Cobia performed robotic hysterectomy with bilateral salpingo-oophorectomy, bilateral pelvic lymph node dissection and periaortic lymph node dissection. Intraoperatively on frozen section, the patient was noted to have a large endometrial polyp with changes within the polyp consistent for high-grade malignancy, possibly papillary serous carcinoma. There is no obvious extrauterine disease noted.      07/30/2012 Pathology Results    [phone] SUPPLEMENTAL REPORT  THE ENDOMETRIAL CARCINOMA WAS ANALYZED FOR DNA MISMATCH REPAIR PROTEINS.IMMUNOHISTOCHEMICALLY, THE NEOPLASM RETAINED NUCLEAR EXPRESSION OF 4 GENE PRODUCTS, MLH1, MSH2, MSH6, AND PMS2, INVOLVED IN DNA MISMATCH REPAIR.POSITIVE AND NEGATIVE CONTROLS WORKED APPROPRIATELY.  PER REQUEST, AN ER AND PR ARE PERFORMED ON BLOCK 1I.  ER- APPROXIMATELY 25-35% STAINING IN NEOPLASTIC CELLS (INTERMEDIATE)  PR- APPROXIMATELY 25-35% STAINING IN NEOPLASTIC CELLS (STRONG)  ER AND PR PREDOMINANTLY SHOW STAINING IN THE SEROUS COMPONENT.  DIAGNOSIS  1. UTERUS, CERVIX WITH BILATERAL FALLOPIAN TUBES AND OVARIES,  HYSTERECTOMY AND BILATERAL SALPINGO-OOPHORECTOMY:  HIGH GRADE/POORLY DIFFERENTIATED ENDOMETRIAL ADENOCARCINOMA WITH SOLID AND SEROUS PAPILLARY COMPONENTS.  HISTOPATHOLOGIC TYPE:A VARIETY OF PATTERNS WERE PRESENT, ALL OF WHICH SHOULD BE CONSIDERED TO BE HIGH GRADE.SEROUS PAPILLARY CARCINOMA WAS SEEN OCCURRING ADJACENT TO SOLID ENDOMETRIAL CARCINOMA WITH MARKED ANAPLASIA AND GIANT CELLS. SIZE:TUMOR MEASURED AT LEAST 4.8 CM IN GREATEST HORIZONTAL DIMENSION.  GRADE: POORLY  DIFFERENTIATED OR HIGH GRADE. DEPTH OF INVASION: NO DEFINITE MYOMETRIAL  INVOLVEMENT WAS SEEN.THE TUMOR APPEARED CONFINED TO THE POLYP AS WELL AS SURFACE ENDOMETRIUM. WHERE MYOMETRIAL THICKNESS NOT APPLICABLE. SEROSAL INVOLVEMENT: NOT DEMONSTRATED.  ENDOCERVICAL INVOLVEMENT:NOT DEMONSTRATED. RESECTION MARGINS: FREE OF INVOLVEMENT. EXTRAUTERINE EXTENSION:NOT DEMONSTRATED. ANGIOLYMPHATIC INVASION: NOT DEFINITELY SEEN. TOTAL NODES EXAMINED:22.  PELVIC NODES EXAMINED:20.  PELVIC NODES INVOLVED:0.  PARA-AORTIC NODES 2. EXAMINED:  PARA-AORTIC NODES 0. INVOLVED TNM STAGE: T1A N0 MX. AJCC STAGE GROUPING: IA. FIGO STAGE:IA.    08/26/2012 Imaging    No CT evidence for intra-abdominal or pelvic metastatic disease. Trace free pelvic fluid, presumably postoperative although the date of surgery is not documented in the electronic medical record.    08/26/2012 - 12/13/2012 Chemotherapy    She received 6 cycles of carbo/taxol     10/29/2012 - 11/27/2012 Radiation Therapy    May 27, June 5,  June 11, June 19, November 27, 2012: Proximal vagina 30 Gy in 5 fractions      01/17/2013 Imaging    1.  No evidence of recurrent or metastatic disease. 2.  No acute abnormality involving the abdomen or pelvis. 3.  Mild diffuse hepatic steatosis. 4.  Very small supraumbilical midline anterior abdominal wall hernia containing fat, unchanged    01/22/2014 Imaging    No evidence for metastatic or recurrent disease. 2. No bowel obstruction.  Normal appendix. 3. Small fat containing hernia, stable in appearance. 4. Status post hysterectomy and bilateral oophorectomy    02/19/2014 Imaging    No pulmonary lesions are identified. The abnormality on the chest x-ray is due to asymmetric left-sided sternoclavicular joint degenerative disease    10/12/2014 Imaging    New mild retroperitoneal lymphadenopathy in the left paraaortic region and  proximal left common iliac chain, consistent with metastatic disease. No other sites of metastatic disease identified within the abdomen or pelvis.      11/27/2014 - 02/11/2015 Chemotherapy    She received 4 cycles of carbo/taxol     03/01/2015 PET scan    Single hypermetabolic small retroperitoneal lymph node along the aorta. 2. No evidence of metastatic disease otherwise in the abdomen or pelvis. No evidence local recurrence. 3. Intensely hypermetabolic enlarged nodule adjacent to the RIGHT lobe of thyroid gland. This presumably represents the biopsied lesion in clinician report which was found to be benign thyroid tissue.    04/05/2015 - 05/13/2015 Radiation Therapy    She received 50.4 gray in 28 fractions with simultaneous integrated boost to 56 gray    06/17/2015 Imaging    No acute process or evidence of metastatic disease in the abdomen or pelvis. Resolution of previously described retroperitoneal adenopathy. 2.  Possible constipation. 3. Atherosclerosis.    06/17/2015 Tumor Marker    Patient's tumor was tested for the following markers: CA125 Results of the tumor marker test revealed 33    08/12/2015 Tumor Marker    Patient's tumor was tested for the following markers: CA125 Results of the tumor marker test revealed 52    08/31/2015 PET scan    Development of right paratracheal hypermetabolic adenopathy, consistent with nodal metastasis. 2. The previously described isolated abdominal retroperitoneal hypermetabolic node has resolved. 3. Persistent hypermetabolic right thyroid nodule, per report previously biopsied. Correlate with those results. 4.  Possible constipation.    09/09/2015 -  Anti-estrogen oral therapy    She has been receiving alternative treatment between megace and tamoxifen    09/09/2015 Tumor Marker    Patient's tumor was tested for the following markers: CA125 Results of the tumor marker test revealed 37.8  09/20/2015 Procedure    Technically successful  ultrasound-guided thyroid aspiration biopsy , dominant right nodule    09/20/2015 Pathology Results    THYROID, RIGHT, FINE NEEDLE ASPIRATION (SPECIMEN 1 OF 1 COLLECTED 09/20/15): FINDINGS CONSISTENT WITH BENIGN THYROID NODULE (BETHESDA CATEGORY II).    10/28/2015 Tumor Marker    Patient's tumor was tested for the following markers: CA125 Results of the tumor marker test revealed 53.2    11/04/2015 Imaging    Stable benign right thyroid nodule    12/16/2015 Tumor Marker    Patient's tumor was tested for the following markers: CA125 Results of the tumor marker test revealed 38.1    03/22/2016 PET scan    Interval progression of hypermetabolic right paratracheal lymph node consistent with metastatic involvement. 2. Stable hypermetabolic right thyroid nodule. Reportedly this has been biopsied in the past.    03/23/2016 Tumor Marker    Patient's tumor was tested for the following markers: CA125 Results of the tumor marker test revealed 62.4    04/13/2016 - 06/01/2016 Radiation Therapy    She received 56 Gy to the chest in 28 fractions     05/09/2016 Imaging    No evidence of left lower extremity deep vein thrombosis. No evidence of a superficial thrombosis of the greater and lesser saphenous veins. Positive for thrombus noted in several varicosities of the calf. No evidence of Baker's cyst on the left.    05/17/2016 Tumor Marker    Patient's tumor was tested for the following markers: CA125 Results of the tumor marker test revealed 9    06/29/2016 Tumor Marker    Patient's tumor was tested for the following markers: CA125 Results of the tumor marker test revealed 5.9    08/04/2016 Tumor Marker    Patient's tumor was tested for the following markers: CA125 Results of the tumor marker test revealed 6.0    09/12/2016 PET scan    Complete metabolic response to therapy, with resolution of hypermetabolic mediastinal lymphadenopathy since prior exam. No residual or new metastatic disease  identified. Stable hypermetabolic right thyroid lobe nodule, which was previously biopsied on 09/20/2015.    03/13/2017 PET scan    1. Solitary focus of recurrent right paratracheal hypermetabolic activity, with a 1.0 cm right lower paratracheal node having a maximum SUV of 9.0. Appearance compatible with recurrent malignancy. 2. Continued hypermetabolic right thyroid nodule, previously biopsied, presumed benign -correlate with prior biopsy results. 3. Other imaging findings of potential clinical significance: Aortic Atherosclerosis (ICD10-I70.0). Mild cardiomegaly. Prominent stool throughout the colon favors constipation.    04/02/2017 Pathology Results    FINE NEEDLE ASPIRATION, ENDOSCOPIC, (EBUS) 4 R NODE (SPECIMEN 1 OF 2 COLLECTED 04/02/17): MALIGNANT CELLS PRESENT, CONSISTENT WITH CARCINOMA. SEE COMMENT. COMMENT: THE MALIGNANT CELLS ARE POSITIVE FOR P53 AND NEGATIVE FOR ESTROGEN RECEPTOR AND TTF-1. THIS PROFILE IS NON-SPECIFIC, BUT THE P53 POSITIVE STAINING IS SUGGESTIVE OF GYNECOLOGIC PRIMARY.     04/11/2017 Procedure    Successful 8 French right internal jugular vein power port placement with its tip at the SVC/RA junction    04/12/2017 Imaging    Normal LV size with mild LV hypertrophy. EF 55-60%. Normal RV size and systolic function. Aortic valve sclerosis without significant stenosis.    04/18/2017 - 06/14/2017 Chemotherapy    The patient had chemotherapy with Doxil     07/10/2017 Imaging    - Left ventricle: The cavity size was normal. There was mild concentric hypertrophy. Systolic function was normal. The estimated ejection fraction was in the range of  55% to 60%. Wall motion was normal; there were no regional wall motion abnormalities. Doppler parameters are consistent with abnormal left ventricular relaxation (grade 1 diastolic dysfunction). - Aortic valve: Noncoronary cusp mobility was mildly restricted. - Mitral valve: There was mild regurgitation. - Left atrium: The atrium  was mildly dilated. - Atrial septum: No defect or patent foramen ovale was identified.  Impressions:  - Compared to November 2018, global LV longitudinal strain remains normal (has increased)    07/11/2017 PET scan    Increased size and hypermetabolic activity of 3.3 cm right thyroid lobe nodule. Thyroid carcinoma cannot be excluded. Recommend repeat ultrasound guided fine needle aspiration to exclude thyroid carcinoma.  New adjacent hypermetabolic 10 mm right supraclavicular lymph node, suspicious for lymph node metastasis.  Slight increase in size and hypermetabolic activity of solitary right paratracheal lymph node.  No evidence of abdominal or pelvic metastatic disease.    07/18/2017 Procedure    1. Technically successful ultrasound guided fine needle aspiration of indeterminate hypermetabolic right-sided thyroid nodule/mass. 2. Technically successful ultrasound-guided core needle biopsy of hypermetabolic right lower cervical lymph node.    07/18/2017 Pathology Results    THYROID, FINE NEEDLE ASPIRATION RIGHT (SPECIMEN 1 OF 1, COLLECTED ON 07/18/2017): ATYPIA OF UNDETERMINED SIGNIFICANCE OR FOLLICULAR LESION OF UNDETERMINED SIGNIFICANCE (BETHESDA CATEGORY III). SEE COMMENT. COMMENT: THE SPECIMEN CONSISTS OF SMALL AND MEDIUM SIZED GROUPS OF FOLLICULAR EPITHELIAL CELLS WITH MILD CYTOLOGIC ATYPIA INCLUDING NUCLEAR ENLARGEMENT AND HURTHLE CELL CHANGE. SOME GROUPS ARE ARRANGED AS MICROFOLLICLES. THERE IS MINIMAL BACKGROUND COLLOID. BASED ON THESE FEATURES, A FOLLICULAR LESION/NEOPLASM CAN NOT BE ENTIRELY RULED OUT. A SPECIMEN WILL BE SENT FOR AFIRMA TESTING.    07/19/2017 Pathology Results    Lymph node, needle/core biopsy - METASTATIC PAPILLARY SEROUS CARCINOMA. - SEE COMMENT. Microscopic Comment Dr. Vicente Males has reviewed the case and concurs with this interpretation    10/15/2017 PET scan    No new or progressive disease. No evidence of abdominal or pelvic metastatic disease.  Stable  hypermetabolic right thyroid lobe nodule and adjacent right supraclavicular lymph node.  Decreased size and hypermetabolic activity of solitary right paratracheal lymph node.    01/23/2018 PET scan    1. Overall, no significant change from PET-CT of 3 months ago. There is persistent hypermetabolic activity at the right thoracic inlet and within a right paratracheal mediastinal node. The nodes have not significantly changed in size, although the metabolic activity has minimally increased over this interval. It is uncertain how much of the thoracic inlet metabolic activity is attributed to the thyroid nodule versus adjacent cervical lymph nodes, both previously biopsied. 2. No new hypermetabolic activity within the neck, chest, abdomen or pelvis. No evidence of local recurrence in the pelvis. 3. Stable probable radiation changes in the right lung.    04/16/2018 PET scan    1. Interval increase in metabolic activity of the RIGHT supraclavicular node and RIGHT lower paratracheal mediastinal node with minimal change in size. Findings consistent with persistent and mildly progressed metastatic adenopathy. 2. New hypermetabolic small lymph node in the RIGHT lower paratracheal nodal station adjacent to the previously followed node. 3. No evidence of local recurrence in the pelvis or new disease elsewhere.    07/30/2018 PET scan    1. Mild interval progression of right supraclavicular, mediastinal, and right hilar hypermetabolic metastatic disease. There is a new hypermetabolic lymph node in the subcarinal station on today's study. 2. No hypermetabolic disease in the abdomen or pelvis to suggest recurrent/metastases. 3.  Aortic Atherosclerois (  ICD10-170.0)     08/09/2018 -  Chemotherapy    The patient had Lenvima since 3/6 and pembrolizumab since 3/13 for chemotherapy treatment. Michel Santee was held temporarily due to infection and severe hypertension    10/31/2018 PET scan    Partial response to therapy,  with decreased hypermetabolic lymphadenopathy in mediastinum and right hilar region.  No significant change in hypermetabolic lymphadenopathy in right inferior neck.  No new or progressive metastatic disease identified. 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      REVIEW OF SYSTEMS:   Constitutional: Denies fevers, chills or abnormal weight loss Eyes: Denies blurriness of vision Ears, nose, mouth, throat, and face: Denies mucositis or sore throat Respiratory: Denies cough, dyspnea or wheezes Cardiovascular: Denies palpitation, chest discomfort or lower extremity swelling Gastrointestinal:  Denies nausea, heartburn or change in bowel habits Skin: Denies abnormal skin rashes Lymphatics: Denies new lymphadenopathy or easy bruising Neurological:Denies numbness, tingling or new weaknesses Behavioral/Psych: Mood is stable, no new changes  All other systems were reviewed with the patient and are negative.  I have reviewed the past medical history, past surgical history, social history and family history with the patient and they are unchanged from previous note.  ALLERGIES:  has No Known Allergies.  MEDICATIONS:  Current Outpatient Medications  Medication Sig Dispense Refill  . amLODipine (NORVASC) 10 MG tablet TAKE 1 TABLET BY MOUTH DAILY. DECREASE SIMVASTATIN TO 20MG DAILY WHILE ON AMLODIPINE PER MD 90 tablet 1  . Calcium Carb-Cholecalciferol 500-600 MG-UNIT TABS Take 1 tablet by mouth daily.     . Cholecalciferol (VITAMIN D3) 2000  units TABS Take 2,000 Units by mouth daily.     . hydrochlorothiazide (HYDRODIURIL) 25 MG tablet Take 1 tablet (25 mg total) by mouth daily. 30 tablet 9  . lenvatinib 10 mg daily dose (LENVIMA, 10 MG DAILY DOSE,) capsule Take 1 capsule (10 mg total) by mouth daily. 30 capsule 11  . levothyroxine (SYNTHROID) 50 MCG tablet Take 1 tablet (50 mcg total) by mouth daily before breakfast. 30 tablet 0  . LORazepam (ATIVAN) 0.5 MG tablet Take 0.5 mg by mouth 2 (two) times daily as needed.  0  . losartan (COZAAR) 50 MG tablet Take 1 tablet (50 mg total) by mouth daily. 30 tablet 11  . metoprolol tartrate (LOPRESSOR) 25 MG tablet Take 1 tablet (25 mg total) by mouth 2 (two) times daily. 60 tablet 1  . simvastatin (ZOCOR) 40 MG tablet Take 40 mg by mouth every evening.    . tacrolimus (PROTOPIC) 0.1 % ointment Apply 1 application topically daily as needed (rash).     . venlafaxine XR (EFFEXOR-XR) 75 MG 24 hr capsule Take 75 mg by mouth daily with breakfast.      No current facility-administered medications for this visit.     PHYSICAL EXAMINATION: ECOG PERFORMANCE STATUS: 0 - Asymptomatic  Vitals:   11/01/18 0941  BP: 129/61  Pulse: 78  Resp: 18  Temp: 98.7 F (37.1 C)  SpO2: 99%   Filed Weights   11/01/18 0941  Weight: 157 lb 14.4 oz (71.6 kg)    GENERAL:alert, no distress and comfortable Musculoskeletal:no cyanosis of digits and no clubbing  NEURO: alert & oriented x 3 with fluent speech, no focal motor/sensory deficits  LABORATORY DATA:  I have reviewed the data as listed    Component Value Date/Time   NA 139 10/21/2018 1036   NA 141 05/17/2017 0957   K 3.9 10/21/2018 1036   K 3.6 05/17/2017 0957   CL 102 10/21/2018 1036   CL 105 11/19/2012 0848   CO2 29 10/21/2018 1036   CO2 25 05/17/2017 0957   GLUCOSE 107 (H) 10/21/2018 1036   GLUCOSE 85 05/17/2017 0957   GLUCOSE 122 (H) 11/19/2012 0848   BUN 26 (H) 10/21/2018 1036   BUN 15.2 05/17/2017 0957   CREATININE 0.93 10/21/2018  1036   CREATININE 0.8 05/17/2017 0957   CALCIUM 9.6 10/21/2018 1036   CALCIUM 9.6 05/17/2017 0957   PROT 7.3 10/21/2018 1036   PROT 7.0 05/17/2017 0957   ALBUMIN 3.6 10/21/2018 1036   ALBUMIN 3.9 05/17/2017 0957   AST 25 10/21/2018 1036   AST 25 05/17/2017 0957   ALT 18 10/21/2018 1036   ALT 18 05/17/2017 0957   ALKPHOS 58 10/21/2018 1036   ALKPHOS 57 05/17/2017 0957   BILITOT 0.5 10/21/2018 1036   BILITOT 0.47 05/17/2017 0957   GFRNONAA >60 10/21/2018 1036   GFRAA >60 10/21/2018 1036    No results found for: SPEP, UPEP  Lab Results  Component Value Date   WBC 4.3 10/21/2018   NEUTROABS 2.8 10/21/2018   HGB 11.8 (L) 10/21/2018   HCT 35.3 (L) 10/21/2018   MCV 95.4 10/21/2018   PLT 166 10/21/2018      Chemistry      Component Value Date/Time   NA 139 10/21/2018 1036   NA 141 05/17/2017 0957   K 3.9 10/21/2018 1036   K 3.6 05/17/2017 0957   CL 102 10/21/2018 1036   CL 105 11/19/2012 0848   CO2 29 10/21/2018 1036   CO2 25 05/17/2017 0957   BUN 26 (H) 10/21/2018 1036   BUN 15.2 05/17/2017 0957   CREATININE 0.93 10/21/2018 1036   CREATININE 0.8 05/17/2017 0957   GLU 158 (H) 01/14/2015 1035      Component Value Date/Time   CALCIUM 9.6 10/21/2018 1036   CALCIUM 9.6 05/17/2017 0957   ALKPHOS 58 10/21/2018 1036   ALKPHOS 57 05/17/2017 0957   AST 25 10/21/2018 1036   AST 25 05/17/2017 0957   ALT 18 10/21/2018 1036   ALT 18 05/17/2017 0957   BILITOT 0.5 10/21/2018 1036   BILITOT 0.47 05/17/2017 0957       RADIOGRAPHIC STUDIES: I have reviewed multiple imaging studies with the patient I have personally reviewed the radiological images as listed and agreed with the findings in the report. Nm Pet Image Restag (ps) Skull Base To Thigh  Result Date: 10/31/2018 CLINICAL DATA:  Subsequent treatment strategy for endometrial carcinoma. Currently undergoing immunotherapy. EXAM: NUCLEAR MEDICINE PET SKULL BASE TO THIGH TECHNIQUE: 8.9 mCi F-18 FDG was injected  intravenously. Full-ring PET imaging was performed from the skull base to thigh after the radiotracer. CT data was obtained and used for attenuation correction and anatomic localization. Fasting blood glucose: 79 mg/dl COMPARISON:  07/30/2018 FINDINGS: Mediastinal blood-pool activity (background): SUV max = 2.2 Liver activity (reference): SUV max = N/A NECK: Mild right supraclavicular and lower jugular lymphadenopathy again seen, with largest lymph node measuring 1.5 cm without significant change. These  nodes have SUV max of 12.8, compared to 11.4 previously. New or increased cervical lymphadenopathy identified. Incidental CT findings:  None. CHEST: Previously seen hypermetabolic lymphadenopathy in the right hilar and subcarinal regions has resolved since previous study. A 9 mm right paratracheal lymph node has decreased from 11 mm on previous study, and has SUV max of 7.2 compared to 10.8 previously. No new or increased sites of hypermetabolic lymphadenopathy identified. No suspicious pulmonary nodules seen on CT images. Incidental CT findings:  None. ABDOMEN/PELVIS: No abnormal hypermetabolic activity within the liver, pancreas, adrenal glands, or spleen. No hypermetabolic lymph nodes in the abdomen or pelvis. Incidental CT findings:  None. SKELETON: No focal hypermetabolic bone lesions to suggest skeletal metastasis. Incidental CT findings:  None. IMPRESSION: Partial response to therapy, with decreased hypermetabolic lymphadenopathy in mediastinum and right hilar region. No significant change in hypermetabolic lymphadenopathy in right inferior neck. No new or progressive metastatic disease identified. No evidence of recurrent or metastatic carcinoma in the pelvis or abdomen. Electronically Signed   By: Earle Gell M.D.   On: 10/31/2018 10:12    All questions were answered. The patient knows to call the clinic with any problems, questions or concerns. No barriers to learning was detected.  I spent 15 minutes  counseling the patient face to face. The total time spent in the appointment was 20 minutes and more than 50% was on counseling and review of test results  [name], MD 11/01/2018 10:20 AM

## 2018-11-01 NOTE — Assessment & Plan Note (Signed)
I have reviewed imaging study with the patient's She has near complete resolution of lymphadenopathy in her chest The right neck persistent uptake is stable She will continue a few more months of treatment with reduced dose lenvatinib

## 2018-11-01 NOTE — Assessment & Plan Note (Signed)
Her blood pressure control is satisfactory She will continue the same She is instructed to monitor her blood pressure closely.

## 2018-11-04 ENCOUNTER — Telehealth: Payer: Self-pay | Admitting: Hematology and Oncology

## 2018-11-04 NOTE — Telephone Encounter (Signed)
Called regarding schedule °

## 2018-11-11 ENCOUNTER — Other Ambulatory Visit: Payer: 59

## 2018-11-11 ENCOUNTER — Ambulatory Visit: Payer: 59

## 2018-11-11 ENCOUNTER — Telehealth: Payer: Self-pay

## 2018-11-11 ENCOUNTER — Inpatient Hospital Stay: Payer: 59 | Attending: Hematology and Oncology

## 2018-11-11 ENCOUNTER — Inpatient Hospital Stay: Payer: 59

## 2018-11-11 ENCOUNTER — Other Ambulatory Visit: Payer: Self-pay

## 2018-11-11 VITALS — BP 108/78 | HR 73 | Temp 98.3°F | Resp 16

## 2018-11-11 DIAGNOSIS — R809 Proteinuria, unspecified: Secondary | ICD-10-CM | POA: Insufficient documentation

## 2018-11-11 DIAGNOSIS — E039 Hypothyroidism, unspecified: Secondary | ICD-10-CM | POA: Diagnosis not present

## 2018-11-11 DIAGNOSIS — Z923 Personal history of irradiation: Secondary | ICD-10-CM | POA: Insufficient documentation

## 2018-11-11 DIAGNOSIS — Z79899 Other long term (current) drug therapy: Secondary | ICD-10-CM | POA: Insufficient documentation

## 2018-11-11 DIAGNOSIS — Z90710 Acquired absence of both cervix and uterus: Secondary | ICD-10-CM | POA: Diagnosis not present

## 2018-11-11 DIAGNOSIS — I10 Essential (primary) hypertension: Secondary | ICD-10-CM | POA: Diagnosis not present

## 2018-11-11 DIAGNOSIS — Z95828 Presence of other vascular implants and grafts: Secondary | ICD-10-CM

## 2018-11-11 DIAGNOSIS — Z90722 Acquired absence of ovaries, bilateral: Secondary | ICD-10-CM | POA: Insufficient documentation

## 2018-11-11 DIAGNOSIS — C770 Secondary and unspecified malignant neoplasm of lymph nodes of head, face and neck: Secondary | ICD-10-CM

## 2018-11-11 DIAGNOSIS — Z9221 Personal history of antineoplastic chemotherapy: Secondary | ICD-10-CM | POA: Insufficient documentation

## 2018-11-11 DIAGNOSIS — C786 Secondary malignant neoplasm of retroperitoneum and peritoneum: Secondary | ICD-10-CM | POA: Insufficient documentation

## 2018-11-11 DIAGNOSIS — C541 Malignant neoplasm of endometrium: Secondary | ICD-10-CM

## 2018-11-11 DIAGNOSIS — Z5112 Encounter for antineoplastic immunotherapy: Secondary | ICD-10-CM | POA: Diagnosis not present

## 2018-11-11 DIAGNOSIS — Z7189 Other specified counseling: Secondary | ICD-10-CM

## 2018-11-11 LAB — CBC WITH DIFFERENTIAL/PLATELET
Abs Immature Granulocytes: 0.01 10*3/uL (ref 0.00–0.07)
Basophils Absolute: 0.1 10*3/uL (ref 0.0–0.1)
Basophils Relative: 1 %
Eosinophils Absolute: 0.3 10*3/uL (ref 0.0–0.5)
Eosinophils Relative: 5 %
HCT: 35.1 % — ABNORMAL LOW (ref 36.0–46.0)
Hemoglobin: 11.4 g/dL — ABNORMAL LOW (ref 12.0–15.0)
Immature Granulocytes: 0 %
Lymphocytes Relative: 18 %
Lymphs Abs: 0.9 10*3/uL (ref 0.7–4.0)
MCH: 31.6 pg (ref 26.0–34.0)
MCHC: 32.5 g/dL (ref 30.0–36.0)
MCV: 97.2 fL (ref 80.0–100.0)
Monocytes Absolute: 0.4 10*3/uL (ref 0.1–1.0)
Monocytes Relative: 8 %
Neutro Abs: 3.5 10*3/uL (ref 1.7–7.7)
Neutrophils Relative %: 68 %
Platelets: 184 10*3/uL (ref 150–400)
RBC: 3.61 MIL/uL — ABNORMAL LOW (ref 3.87–5.11)
RDW: 12.5 % (ref 11.5–15.5)
WBC: 5.2 10*3/uL (ref 4.0–10.5)
nRBC: 0 % (ref 0.0–0.2)

## 2018-11-11 LAB — COMPREHENSIVE METABOLIC PANEL
ALT: 17 U/L (ref 0–44)
AST: 23 U/L (ref 15–41)
Albumin: 3.5 g/dL (ref 3.5–5.0)
Alkaline Phosphatase: 54 U/L (ref 38–126)
Anion gap: 10 (ref 5–15)
BUN: 27 mg/dL — ABNORMAL HIGH (ref 8–23)
CO2: 25 mmol/L (ref 22–32)
Calcium: 9.2 mg/dL (ref 8.9–10.3)
Chloride: 102 mmol/L (ref 98–111)
Creatinine, Ser: 0.82 mg/dL (ref 0.44–1.00)
GFR calc Af Amer: >60 mL/min (ref >60)
GFR calc non Af Amer: >60 mL/min (ref >60)
Glucose, Bld: 83 mg/dL (ref 70–99)
Potassium: 4.1 mmol/L (ref 3.5–5.1)
Sodium: 137 mmol/L (ref 135–145)
Total Bilirubin: 0.5 mg/dL (ref 0.3–1.2)
Total Protein: 7.2 g/dL (ref 6.5–8.1)

## 2018-11-11 LAB — TOTAL PROTEIN, URINE DIPSTICK: Protein, ur: 300 mg/dL — AB

## 2018-11-11 LAB — TSH: TSH: 4.113 u[IU]/mL — ABNORMAL HIGH (ref 0.308–3.960)

## 2018-11-11 MED ORDER — SODIUM CHLORIDE 0.9 % IV SOLN
Freq: Once | INTRAVENOUS | Status: AC
  Administered 2018-11-11: 14:00:00 via INTRAVENOUS
  Filled 2018-11-11: qty 250

## 2018-11-11 MED ORDER — SODIUM CHLORIDE 0.9% FLUSH
10.0000 mL | INTRAVENOUS | Status: DC | PRN
  Administered 2018-11-11: 10 mL
  Filled 2018-11-11: qty 10

## 2018-11-11 MED ORDER — SODIUM CHLORIDE 0.9% FLUSH
10.0000 mL | Freq: Once | INTRAVENOUS | Status: AC
  Administered 2018-11-11: 10 mL
  Filled 2018-11-11: qty 10

## 2018-11-11 MED ORDER — SODIUM CHLORIDE 0.9 % IV SOLN
200.0000 mg | Freq: Once | INTRAVENOUS | Status: AC
  Administered 2018-11-11: 200 mg via INTRAVENOUS
  Filled 2018-11-11: qty 8

## 2018-11-11 MED ORDER — HEPARIN SOD (PORK) LOCK FLUSH 100 UNIT/ML IV SOLN
500.0000 [IU] | Freq: Once | INTRAVENOUS | Status: AC | PRN
  Administered 2018-11-11: 500 [IU]
  Filled 2018-11-11: qty 5

## 2018-11-11 NOTE — Patient Instructions (Signed)
[facility] Discharge Instructions for Patients Receiving Chemotherapy  Today you received the following chemotherapy agents Pembrolizumab (KEYTRUDA).  To help prevent nausea and vomiting after your treatment, we encourage you to take your nausea medication as prescribed.   If you develop nausea and vomiting that is not controlled by your nausea medication, call the clinic.   BELOW ARE SYMPTOMS THAT SHOULD BE REPORTED IMMEDIATELY:  *FEVER GREATER THAN 100.5 F  *CHILLS WITH OR WITHOUT FEVER  NAUSEA AND VOMITING THAT IS NOT CONTROLLED WITH YOUR NAUSEA MEDICATION  *UNUSUAL SHORTNESS OF BREATH  *UNUSUAL BRUISING OR BLEEDING  TENDERNESS IN MOUTH AND THROAT WITH OR WITHOUT PRESENCE OF ULCERS  *URINARY PROBLEMS  *BOWEL PROBLEMS  UNUSUAL RASH Items with * indicate a potential emergency and should be followed up as soon as possible.  Feel free to call the clinic should you have any questions or concerns. The clinic phone number is [phone].  Please show the CHEMO ALERT CARD at check-in to the Emergency Department and triage nurse.  Coronavirus (COVID-19) Are you at risk?  Are you at risk for the Coronavirus (COVID-19)?  To be considered HIGH RISK for Coronavirus (COVID-19), you have to meet the following criteria:  . Traveled to [place], [place], [place], [place] or [place]; or in the [place] to [place], [place], [place], or [place]; and have fever, cough, and shortness of breath within the last 2 weeks of travel OR . Been in close contact with a person diagnosed with COVID-19 within the last 2 weeks and have fever, cough, and shortness of breath . IF YOU DO NOT MEET THESE CRITERIA, YOU ARE CONSIDERED LOW RISK FOR COVID-19.  What to do if you are HIGH RISK for COVID-19?  . If you are having a medical emergency, call 911. . Seek medical care right away. Before you go to a doctor's office, urgent care or emergency department, call ahead and tell  them about your recent travel, contact with someone diagnosed with COVID-19, and your symptoms. You should receive instructions from your physician's office regarding next steps of care.  . When you arrive at healthcare provider, tell the healthcare staff immediately you have returned from visiting [place], [place], [place], [place] or [place]; or traveled in the [place] to [place], [place], [place], or [place]; in the last two weeks or you have been in close contact with a person diagnosed with COVID-19 in the last 2 weeks.   . Tell the health care staff about your symptoms: fever, cough and shortness of breath. . After you have been seen by a medical provider, you will be either: o Tested for (COVID-19) and discharged home on quarantine except to seek medical care if symptoms worsen, and asked to  - Stay home and avoid contact with others until you get your results (4-5 days)  - Avoid travel on public transportation if possible (such as bus, train, or airplane) or o Sent to the Emergency Department by EMS for evaluation, COVID-19 testing, and possible admission depending on your condition and test results.  What to do if you are LOW RISK for COVID-19?  Reduce your risk of any infection by using the same precautions used for avoiding the common cold or flu:  . Wash your hands often with soap and warm water for at least 20 seconds.  If soap and water are not readily available, use an alcohol-based hand sanitizer with at least 60% alcohol.  . If coughing or   sneezing, cover your mouth and nose by coughing or sneezing into the elbow areas of your shirt or coat, into a tissue or into your sleeve (not your hands). . Avoid shaking hands with others and consider head nods or verbal greetings only. . Avoid touching your eyes, nose, or mouth with unwashed hands.  . Avoid close contact with people who are sick. . Avoid places or events with large numbers of people in one location, like concerts or  sporting events. . Carefully consider travel plans you have or are making. . If you are planning any travel outside or inside the [place], visit the CDC's Travelers' Health webpage for the latest health notices. . If you have some symptoms but not all symptoms, continue to monitor at home and seek medical attention if your symptoms worsen. . If you are having a medical emergency, call 911.   ADDITIONAL HEALTHCARE OPTIONS FOR PATIENTS   Telehealth / e-Visit: [email or web address]         [facility]: [phone]  [facility]: [phone]                   [facility]: [phone]    

## 2018-11-11 NOTE — Telephone Encounter (Signed)
-----   Message from [name], MD sent at 11/11/2018  1:06 PM EDT ----- Regarding: proteinuria Her protein level is high How is her BP at home? If BP is ok, tell her to hold Mangham for a week and then restart

## 2018-11-11 NOTE — Telephone Encounter (Signed)
Called and given below message. She verbalized understanding. Blood pressure is good. She will stop Lenvima for one week and then restart.

## 2018-11-14 ENCOUNTER — Other Ambulatory Visit: Payer: Self-pay | Admitting: Hematology and Oncology

## 2018-11-14 MED FILL — LEVOTHYROXINE 50 MCG TABLET: 50 | 30 days supply | Qty: 30 | Fill #0

## 2018-11-14 MED FILL — HYDROCHLOROTHIAZIDE 25 MG T: 25 | 30 days supply | Qty: 30 | Fill #0

## 2018-11-15 MED FILL — LOSARTAN POTASSIUM 50 MG TA: 50 | 30 days supply | Qty: 30 | Fill #2

## 2018-11-25 MED FILL — METOPROLOL TARTRATE 25 MG T: 25 | 30 days supply | Qty: 60 | Fill #0

## 2018-11-28 MED FILL — LENVIMA 10 MG DAILY DOSE: 10 | 30 days supply | Qty: 30 | Fill #1

## 2018-12-02 ENCOUNTER — Inpatient Hospital Stay: Payer: 59

## 2018-12-02 ENCOUNTER — Ambulatory Visit: Payer: 59 | Admitting: Hematology and Oncology

## 2018-12-02 ENCOUNTER — Encounter: Payer: Self-pay | Admitting: Hematology and Oncology

## 2018-12-02 ENCOUNTER — Telehealth: Payer: Self-pay | Admitting: Hematology and Oncology

## 2018-12-02 ENCOUNTER — Other Ambulatory Visit: Payer: Self-pay

## 2018-12-02 ENCOUNTER — Ambulatory Visit: Payer: 59

## 2018-12-02 ENCOUNTER — Other Ambulatory Visit: Payer: 59

## 2018-12-02 ENCOUNTER — Inpatient Hospital Stay (HOSPITAL_BASED_OUTPATIENT_CLINIC_OR_DEPARTMENT_OTHER): Payer: 59 | Admitting: Hematology and Oncology

## 2018-12-02 DIAGNOSIS — C541 Malignant neoplasm of endometrium: Secondary | ICD-10-CM | POA: Diagnosis not present

## 2018-12-02 DIAGNOSIS — Z90722 Acquired absence of ovaries, bilateral: Secondary | ICD-10-CM | POA: Diagnosis not present

## 2018-12-02 DIAGNOSIS — E039 Hypothyroidism, unspecified: Secondary | ICD-10-CM

## 2018-12-02 DIAGNOSIS — Z923 Personal history of irradiation: Secondary | ICD-10-CM | POA: Diagnosis not present

## 2018-12-02 DIAGNOSIS — Z95828 Presence of other vascular implants and grafts: Secondary | ICD-10-CM

## 2018-12-02 DIAGNOSIS — C786 Secondary malignant neoplasm of retroperitoneum and peritoneum: Secondary | ICD-10-CM

## 2018-12-02 DIAGNOSIS — C770 Secondary and unspecified malignant neoplasm of lymph nodes of head, face and neck: Secondary | ICD-10-CM

## 2018-12-02 DIAGNOSIS — I10 Essential (primary) hypertension: Secondary | ICD-10-CM

## 2018-12-02 DIAGNOSIS — Z90710 Acquired absence of both cervix and uterus: Secondary | ICD-10-CM

## 2018-12-02 DIAGNOSIS — Z9221 Personal history of antineoplastic chemotherapy: Secondary | ICD-10-CM | POA: Diagnosis not present

## 2018-12-02 DIAGNOSIS — R809 Proteinuria, unspecified: Secondary | ICD-10-CM | POA: Diagnosis not present

## 2018-12-02 DIAGNOSIS — Z5112 Encounter for antineoplastic immunotherapy: Secondary | ICD-10-CM | POA: Diagnosis not present

## 2018-12-02 DIAGNOSIS — Z7189 Other specified counseling: Secondary | ICD-10-CM

## 2018-12-02 DIAGNOSIS — Z79899 Other long term (current) drug therapy: Secondary | ICD-10-CM

## 2018-12-02 LAB — CBC WITH DIFFERENTIAL/PLATELET
Abs Immature Granulocytes: 0.01 10*3/uL (ref 0.00–0.07)
Basophils Absolute: 0 10*3/uL (ref 0.0–0.1)
Basophils Relative: 1 %
Eosinophils Absolute: 0.3 10*3/uL (ref 0.0–0.5)
Eosinophils Relative: 5 %
HCT: 32.3 % — ABNORMAL LOW (ref 36.0–46.0)
Hemoglobin: 10.8 g/dL — ABNORMAL LOW (ref 12.0–15.0)
Immature Granulocytes: 0 %
Lymphocytes Relative: 18 %
Lymphs Abs: 0.8 10*3/uL (ref 0.7–4.0)
MCH: 31.9 pg (ref 26.0–34.0)
MCHC: 33.4 g/dL (ref 30.0–36.0)
MCV: 95.3 fL (ref 80.0–100.0)
Monocytes Absolute: 0.3 10*3/uL (ref 0.1–1.0)
Monocytes Relative: 7 %
Neutro Abs: 3.2 10*3/uL (ref 1.7–7.7)
Neutrophils Relative %: 69 %
Platelets: 159 10*3/uL (ref 150–400)
RBC: 3.39 MIL/uL — ABNORMAL LOW (ref 3.87–5.11)
RDW: 12.5 % (ref 11.5–15.5)
WBC: 4.6 10*3/uL (ref 4.0–10.5)
nRBC: 0 % (ref 0.0–0.2)

## 2018-12-02 LAB — COMPREHENSIVE METABOLIC PANEL
ALT: 20 U/L (ref 0–44)
AST: 30 U/L (ref 15–41)
Albumin: 3.5 g/dL (ref 3.5–5.0)
Alkaline Phosphatase: 53 U/L (ref 38–126)
Anion gap: 9 (ref 5–15)
BUN: 20 mg/dL (ref 8–23)
CO2: 26 mmol/L (ref 22–32)
Calcium: 9.3 mg/dL (ref 8.9–10.3)
Chloride: 102 mmol/L (ref 98–111)
Creatinine, Ser: 0.8 mg/dL (ref 0.44–1.00)
GFR calc Af Amer: >60 mL/min (ref >60)
GFR calc non Af Amer: >60 mL/min (ref >60)
Glucose, Bld: 85 mg/dL (ref 70–99)
Potassium: 4.4 mmol/L (ref 3.5–5.1)
Sodium: 137 mmol/L (ref 135–145)
Total Bilirubin: 0.4 mg/dL (ref 0.3–1.2)
Total Protein: 7.1 g/dL (ref 6.5–8.1)

## 2018-12-02 LAB — TSH: TSH: 0.806 u[IU]/mL (ref 0.308–3.960)

## 2018-12-02 LAB — TOTAL PROTEIN, URINE DIPSTICK: Protein, ur: 30 mg/dL — AB

## 2018-12-02 MED ORDER — HEPARIN SOD (PORK) LOCK FLUSH 100 UNIT/ML IV SOLN
500.0000 [IU] | Freq: Once | INTRAVENOUS | Status: AC | PRN
  Administered 2018-12-02: 500 [IU]
  Filled 2018-12-02: qty 5

## 2018-12-02 MED ORDER — SODIUM CHLORIDE 0.9% FLUSH
10.0000 mL | Freq: Once | INTRAVENOUS | Status: AC
  Administered 2018-12-02: 10 mL
  Filled 2018-12-02: qty 10

## 2018-12-02 MED ORDER — SODIUM CHLORIDE 0.9 % IV SOLN
Freq: Once | INTRAVENOUS | Status: AC
  Administered 2018-12-02: 14:00:00 via INTRAVENOUS
  Filled 2018-12-02: qty 250

## 2018-12-02 MED ORDER — LOSARTAN POTASSIUM 50 MG PO TABS: 50.0000 mg | ORAL_TABLET | Freq: Every day | ORAL | 11 refills | Status: DC

## 2018-12-02 MED ORDER — SODIUM CHLORIDE 0.9 % IV SOLN
200.0000 mg | Freq: Once | INTRAVENOUS | Status: AC
  Administered 2018-12-02: 200 mg via INTRAVENOUS
  Filled 2018-12-02: qty 8

## 2018-12-02 MED ORDER — SODIUM CHLORIDE 0.9% FLUSH
10.0000 mL | INTRAVENOUS | Status: DC | PRN
  Administered 2018-12-02: 10 mL
  Filled 2018-12-02: qty 10

## 2018-12-02 MED ORDER — HYDROCHLOROTHIAZIDE 25 MG PO TABS: 25.0000 mg | ORAL_TABLET | Freq: Every day | ORAL | 1 refills | Status: DC

## 2018-12-02 MED ORDER — METOPROLOL TARTRATE 25 MG PO TABS: 25.0000 mg | ORAL_TABLET | Freq: Two times a day (BID) | ORAL | 3 refills | Status: DC

## 2018-12-02 NOTE — Assessment & Plan Note (Signed)
Her last imaging study showed near complete resolution of lymphadenopathy in her chest The right neck persistent uptake is stable She will continue a few more months of treatment with reduced dose lenvatinib

## 2018-12-02 NOTE — Assessment & Plan Note (Signed)
Her TSH is currently within normal range She will continue current prescription levothyroxine and I will adjust her medication accordingly

## 2018-12-02 NOTE — Patient Instructions (Signed)
[facility] Discharge Instructions for Patients Receiving Chemotherapy  Today you received the following chemotherapy agents: Pembrolizumab (Keytruda)  To help prevent nausea and vomiting after your treatment, we encourage you to take your nausea medication as directed.   If you develop nausea and vomiting that is not controlled by your nausea medication, call the clinic.   BELOW ARE SYMPTOMS THAT SHOULD BE REPORTED IMMEDIATELY:  *FEVER GREATER THAN 100.5 F  *CHILLS WITH OR WITHOUT FEVER  NAUSEA AND VOMITING THAT IS NOT CONTROLLED WITH YOUR NAUSEA MEDICATION  *UNUSUAL SHORTNESS OF BREATH  *UNUSUAL BRUISING OR BLEEDING  TENDERNESS IN MOUTH AND THROAT WITH OR WITHOUT PRESENCE OF ULCERS  *URINARY PROBLEMS  *BOWEL PROBLEMS  UNUSUAL RASH Items with * indicate a potential emergency and should be followed up as soon as possible.  Feel free to call the clinic should you have any questions or concerns. The clinic phone number is [phone].  Please show the CHEMO ALERT CARD at check-in to the Emergency Department and triage nurse.  Coronavirus (COVID-19) Are you at risk?  Are you at risk for the Coronavirus (COVID-19)?  To be considered HIGH RISK for Coronavirus (COVID-19), you have to meet the following criteria:  . Traveled to [place], [place], [place], [place] or [place]; or in the [place] to [place], [place], [place], or [place]; and have fever, cough, and shortness of breath within the last 2 weeks of travel OR . Been in close contact with a person diagnosed with COVID-19 within the last 2 weeks and have fever, cough, and shortness of breath . IF YOU DO NOT MEET THESE CRITERIA, YOU ARE CONSIDERED LOW RISK FOR COVID-19.  What to do if you are HIGH RISK for COVID-19?  . If you are having a medical emergency, call 911. . Seek medical care right away. Before you go to a doctor's office, urgent care or emergency department, call ahead and tell them  about your recent travel, contact with someone diagnosed with COVID-19, and your symptoms. You should receive instructions from your physician's office regarding next steps of care.  . When you arrive at healthcare provider, tell the healthcare staff immediately you have returned from visiting [place], [place], [place], [place] or [place]; or traveled in the [place] to [place], [place], [place], or [place]; in the last two weeks or you have been in close contact with a person diagnosed with COVID-19 in the last 2 weeks.   . Tell the health care staff about your symptoms: fever, cough and shortness of breath. . After you have been seen by a medical provider, you will be either: o Tested for (COVID-19) and discharged home on quarantine except to seek medical care if symptoms worsen, and asked to  - Stay home and avoid contact with others until you get your results (4-5 days)  - Avoid travel on public transportation if possible (such as bus, train, or airplane) or o Sent to the Emergency Department by EMS for evaluation, COVID-19 testing, and possible admission depending on your condition and test results.  What to do if you are LOW RISK for COVID-19?  Reduce your risk of any infection by using the same precautions used for avoiding the common cold or flu:  . Wash your hands often with soap and warm water for at least 20 seconds.  If soap and water are not readily available, use an alcohol-based hand sanitizer with at least 60% alcohol.  . If coughing or   sneezing, cover your mouth and nose by coughing or sneezing into the elbow areas of your shirt or coat, into a tissue or into your sleeve (not your hands). . Avoid shaking hands with others and consider head nods or verbal greetings only. . Avoid touching your eyes, nose, or mouth with unwashed hands.  . Avoid close contact with people who are sick. . Avoid places or events with large numbers of people in one location, like concerts or  sporting events. . Carefully consider travel plans you have or are making. . If you are planning any travel outside or inside the [place], visit the CDC's Travelers' Health webpage for the latest health notices. . If you have some symptoms but not all symptoms, continue to monitor at home and seek medical attention if your symptoms worsen. . If you are having a medical emergency, call 911.   ADDITIONAL HEALTHCARE OPTIONS FOR PATIENTS  [facility] Tele[facility] / e-Visit: [email or web address]         [facility]: [phone]  [facility]: [phone]                   [facility]: [phone]   

## 2018-12-02 NOTE — Progress Notes (Signed)
[place] OFFICE PROGRESS NOTE  Patient Care Team: [name], MD as PCP - General (Family Medicine) Reynold Bowen, MD as Consulting Physician (Endocrinology)  ASSESSMENT & PLAN:  Endometrial cancer [facility]) Her last imaging study showed near complete resolution of lymphadenopathy in her chest The right neck persistent uptake is stable She will continue a few more months of treatment with reduced dose lenvatinib  Acquired hypothyroidism Her TSH is currently within normal range She will continue current prescription levothyroxine and I will adjust her medication accordingly  Essential hypertension Her blood pressure control is satisfactory She will continue the same She is instructed to monitor her blood pressure closely. Her proteinuria has improved since recent dose reduce lenvatinib   No orders of the defined types were placed in this encounter.   INTERVAL HISTORY: Please see below for problem oriented charting. She returns for further follow-up She denies recent cough, chest pain or shortness of breath No new lymphadenopathy Denies mucositis She tolerated reduced dose lenvatinib well Her blood pressure at home is reasonably controlled  SUMMARY OF ONCOLOGIC HISTORY: Oncology History Overview Note  [employer] testing done 11-2015 on surgical path from 2014: MS stable TMB low 4 muts/mb ATM [id number] ERBB3 T389K E2H2 rearrangement exon 9 PPP2R1A P179R TP53 I195N    ER- APPROXIMATELY 25-35% STAINING IN NEOPLASTIC CELLS (INTERMEDIATE)  PR- APPROXIMATELY 25-35% STAINING IN NEOPLASTIC CELLS (STRONG)  Repeat biopsy 04/02/17: ER negative, Her 2 negative  Progressed on Doxil   Endometrial cancer ([place])  06/20/2012 Pathology Results   Biopsy positive for papillary serous carcinoma   06/20/2012 Genetic Testing   Foundation One testing done 11-2015 on surgical path from 2014: MS stable TMB low 4 muts/mb ATM [id number] ERBB3  T389K E2H2 rearrangement exon 9 PPP2R1A P179R TP53 I195N    06/20/2012 Initial Diagnosis   Patient presented to PCP with intermittent vaginal bleeding since ~ Oct 2013, endometrial biopsy 06-20-12 with complex endometrial hyperplasia with atypia   06/26/2012 Imaging   Thickened endometrial lining in a postmenopausal patient experiencing vaginal bleeding. In the setting of post-menopausal bleeding, endometrial sampling is indicated to exclude carcinoma. No focal myometrial abnormalities are seen.  Normal left ovary and non-visualized right ovary   07/30/2012 Surgery   Dr. Polly Cobia performed robotic hysterectomy with bilateral salpingo-oophorectomy, bilateral pelvic lymph node dissection and periaortic lymph node dissection. Intraoperatively on frozen section, the patient was noted to have a large endometrial polyp with changes within the polyp consistent for high-grade malignancy, possibly papillary serous carcinoma. There is no obvious extrauterine disease noted.     07/30/2012 Pathology Results   [phone] SUPPLEMENTAL REPORT  THE ENDOMETRIAL CARCINOMA WAS ANALYZED FOR DNA MISMATCH REPAIR PROTEINS.IMMUNOHISTOCHEMICALLY, THE NEOPLASM RETAINED NUCLEAR EXPRESSION OF 4 GENE PRODUCTS, MLH1, MSH2, MSH6, AND PMS2, INVOLVED IN DNA MISMATCH REPAIR.POSITIVE AND NEGATIVE CONTROLS WORKED APPROPRIATELY.  PER REQUEST, AN ER AND PR ARE PERFORMED ON BLOCK 1I.  ER- APPROXIMATELY 25-35% STAINING IN NEOPLASTIC CELLS (INTERMEDIATE)  PR- APPROXIMATELY 25-35% STAINING IN NEOPLASTIC CELLS (STRONG)  ER AND PR PREDOMINANTLY SHOW STAINING IN THE SEROUS COMPONENT.  DIAGNOSIS  1. UTERUS, CERVIX WITH BILATERAL FALLOPIAN TUBES AND OVARIES,  HYSTERECTOMY AND BILATERAL SALPINGO-OOPHORECTOMY:  HIGH GRADE/POORLY DIFFERENTIATED ENDOMETRIAL ADENOCARCINOMA WITH SOLID AND SEROUS PAPILLARY COMPONENTS.  HISTOPATHOLOGIC TYPE:A VARIETY OF PATTERNS WERE PRESENT, ALL OF WHICH SHOULD BE CONSIDERED TO BE HIGH  GRADE.SEROUS PAPILLARY CARCINOMA WAS SEEN OCCURRING ADJACENT TO SOLID ENDOMETRIAL CARCINOMA WITH MARKED ANAPLASIA AND GIANT CELLS. SIZE:TUMOR MEASURED AT LEAST 4.8 CM IN GREATEST HORIZONTAL DIMENSION.  GRADE: POORLY  DIFFERENTIATED OR HIGH GRADE. DEPTH OF INVASION: NO DEFINITE MYOMETRIAL INVOLVEMENT WAS SEEN.THE TUMOR APPEARED CONFINED TO THE POLYP AS WELL AS SURFACE ENDOMETRIUM. WHERE MYOMETRIAL THICKNESS NOT APPLICABLE. SEROSAL INVOLVEMENT: NOT DEMONSTRATED.  ENDOCERVICAL INVOLVEMENT:NOT DEMONSTRATED. RESECTION MARGINS: FREE OF INVOLVEMENT. EXTRAUTERINE EXTENSION:NOT DEMONSTRATED. ANGIOLYMPHATIC INVASION: NOT DEFINITELY SEEN. TOTAL NODES EXAMINED:22.  PELVIC NODES EXAMINED:20.  PELVIC NODES INVOLVED:0.  PARA-AORTIC NODES 2. EXAMINED:  PARA-AORTIC NODES 0. INVOLVED TNM STAGE: T1A N0 MX. AJCC STAGE GROUPING: IA. FIGO STAGE:IA.   08/26/2012 Imaging   No CT evidence for intra-abdominal or pelvic metastatic disease. Trace free pelvic fluid, presumably postoperative although the date of surgery is not documented in the electronic medical record.   08/26/2012 - 12/13/2012 Chemotherapy   She received 6 cycles of carbo/taxol    10/29/2012 - 11/27/2012 Radiation Therapy   May 27, June 5,  June 11, June 19, November 27, 2012: Proximal vagina 30 Gy in 5 fractions     01/17/2013 Imaging   1.  No evidence of recurrent or metastatic disease. 2.  No acute abnormality involving the abdomen or pelvis. 3.  Mild diffuse hepatic steatosis. 4.  Very small supraumbilical midline anterior abdominal wall hernia containing fat, unchanged   01/22/2014 Imaging   No evidence for metastatic or recurrent disease. 2. No bowel obstruction.  Normal appendix. 3. Small fat containing hernia, stable in appearance. 4. Status post hysterectomy and bilateral oophorectomy   02/19/2014 Imaging   No pulmonary lesions are identified. The  abnormality on the chest x-ray is due to asymmetric left-sided sternoclavicular joint degenerative disease   10/12/2014 Imaging   New mild retroperitoneal lymphadenopathy in the left paraaortic region and proximal left common iliac chain, consistent with metastatic disease. No other sites of metastatic disease identified within the abdomen or pelvis.     11/27/2014 - 02/11/2015 Chemotherapy   She received 4 cycles of carbo/taxol    03/01/2015 PET scan   Single hypermetabolic small retroperitoneal lymph node along the aorta. 2. No evidence of metastatic disease otherwise in the abdomen or pelvis. No evidence local recurrence. 3. Intensely hypermetabolic enlarged nodule adjacent to the RIGHT lobe of thyroid gland. This presumably represents the biopsied lesion in clinician report which was found to be benign thyroid tissue.   04/05/2015 - 05/13/2015 Radiation Therapy   She received 50.4 gray in 28 fractions with simultaneous integrated boost to 56 gray   06/17/2015 Imaging   No acute process or evidence of metastatic disease in the abdomen or pelvis. Resolution of previously described retroperitoneal adenopathy. 2.  Possible constipation. 3. Atherosclerosis.   06/17/2015 Tumor Marker   Patient's tumor was tested for the following markers: CA125 Results of the tumor marker test revealed 33   08/12/2015 Tumor Marker   Patient's tumor was tested for the following markers: CA125 Results of the tumor marker test revealed 52   08/31/2015 PET scan   Development of right paratracheal hypermetabolic adenopathy, consistent with nodal metastasis. 2. The previously described isolated abdominal retroperitoneal hypermetabolic node has resolved. 3. Persistent hypermetabolic right thyroid nodule, per report previously biopsied. Correlate with those results. 4.  Possible constipation.   09/09/2015 -  Anti-estrogen oral therapy   She has been receiving alternative treatment between megace and tamoxifen   09/09/2015  Tumor Marker   Patient's tumor was tested for the following markers: CA125 Results of the tumor marker test revealed 37.8   09/20/2015 Procedure   Technically successful ultrasound-guided thyroid aspiration biopsy , dominant right nodule   09/20/2015 Pathology Results   THYROID,  RIGHT, FINE NEEDLE ASPIRATION (SPECIMEN 1 OF 1 COLLECTED 09/20/15): FINDINGS CONSISTENT WITH BENIGN THYROID NODULE (BETHESDA CATEGORY II).   10/28/2015 Tumor Marker   Patient's tumor was tested for the following markers: CA125 Results of the tumor marker test revealed 53.2   11/04/2015 Imaging   Stable benign right thyroid nodule   12/16/2015 Tumor Marker   Patient's tumor was tested for the following markers: CA125 Results of the tumor marker test revealed 38.1   03/22/2016 PET scan   Interval progression of hypermetabolic right paratracheal lymph node consistent with metastatic involvement. 2. Stable hypermetabolic right thyroid nodule. Reportedly this has been biopsied in the past.   03/23/2016 Tumor Marker   Patient's tumor was tested for the following markers: CA125 Results of the tumor marker test revealed 62.4   04/13/2016 - 06/01/2016 Radiation Therapy   She received 56 Gy to the chest in 28 fractions    05/09/2016 Imaging   No evidence of left lower extremity deep vein thrombosis. No evidence of a superficial thrombosis of the greater and lesser saphenous veins. Positive for thrombus noted in several varicosities of the calf. No evidence of Baker's cyst on the left.   05/17/2016 Tumor Marker   Patient's tumor was tested for the following markers: CA125 Results of the tumor marker test revealed 9   06/29/2016 Tumor Marker   Patient's tumor was tested for the following markers: CA125 Results of the tumor marker test revealed 5.9   08/04/2016 Tumor Marker   Patient's tumor was tested for the following markers: CA125 Results of the tumor marker test revealed 6.0   09/12/2016 PET scan   Complete  metabolic response to therapy, with resolution of hypermetabolic mediastinal lymphadenopathy since prior exam. No residual or new metastatic disease identified. Stable hypermetabolic right thyroid lobe nodule, which was previously biopsied on 09/20/2015.   03/13/2017 PET scan   1. Solitary focus of recurrent right paratracheal hypermetabolic activity, with a 1.0 cm right lower paratracheal node having a maximum SUV of 9.0. Appearance compatible with recurrent malignancy. 2. Continued hypermetabolic right thyroid nodule, previously biopsied, presumed benign -correlate with prior biopsy results. 3. Other imaging findings of potential clinical significance: Aortic Atherosclerosis (ICD10-I70.0). Mild cardiomegaly. Prominent stool throughout the colon favors constipation.   04/02/2017 Pathology Results   FINE NEEDLE ASPIRATION, ENDOSCOPIC, (EBUS) 4 R NODE (SPECIMEN 1 OF 2 COLLECTED 04/02/17): MALIGNANT CELLS PRESENT, CONSISTENT WITH CARCINOMA. SEE COMMENT. COMMENT: THE MALIGNANT CELLS ARE POSITIVE FOR P53 AND NEGATIVE FOR ESTROGEN RECEPTOR AND TTF-1. THIS PROFILE IS NON-SPECIFIC, BUT THE P53 POSITIVE STAINING IS SUGGESTIVE OF GYNECOLOGIC PRIMARY.    04/11/2017 Procedure   Successful 8 French right internal jugular vein power port placement with its tip at the SVC/RA junction   04/12/2017 Imaging   Normal LV size with mild LV hypertrophy. EF 55-60%. Normal RV size and systolic function. Aortic valve sclerosis without significant stenosis.   04/18/2017 - 06/14/2017 Chemotherapy   The patient had chemotherapy with Doxil    07/10/2017 Imaging   - Left ventricle: The cavity size was normal. There was mild concentric hypertrophy. Systolic function was normal. The estimated ejection fraction was in the range of 55% to 60%. Wall motion was normal; there were no regional wall motion abnormalities. Doppler parameters are consistent with abnormal left ventricular relaxation (grade 1 diastolic dysfunction). -  Aortic valve: Noncoronary cusp mobility was mildly restricted. - Mitral valve: There was mild regurgitation. - Left atrium: The atrium was mildly dilated. - Atrial septum: No defect or patent foramen  ovale was identified.  Impressions:  - Compared to November 2018, global LV longitudinal strain remains normal (has increased)   07/11/2017 PET scan   Increased size and hypermetabolic activity of 3.3 cm right thyroid lobe nodule. Thyroid carcinoma cannot be excluded. Recommend repeat ultrasound guided fine needle aspiration to exclude thyroid carcinoma.  New adjacent hypermetabolic 10 mm right supraclavicular lymph node, suspicious for lymph node metastasis.  Slight increase in size and hypermetabolic activity of solitary right paratracheal lymph node.  No evidence of abdominal or pelvic metastatic disease.   07/18/2017 Procedure   1. Technically successful ultrasound guided fine needle aspiration of indeterminate hypermetabolic right-sided thyroid nodule/mass. 2. Technically successful ultrasound-guided core needle biopsy of hypermetabolic right lower cervical lymph node.   07/18/2017 Pathology Results   THYROID, FINE NEEDLE ASPIRATION RIGHT (SPECIMEN 1 OF 1, COLLECTED ON 07/18/2017): ATYPIA OF UNDETERMINED SIGNIFICANCE OR FOLLICULAR LESION OF UNDETERMINED SIGNIFICANCE (BETHESDA CATEGORY III). SEE COMMENT. COMMENT: THE SPECIMEN CONSISTS OF SMALL AND MEDIUM SIZED GROUPS OF FOLLICULAR EPITHELIAL CELLS WITH MILD CYTOLOGIC ATYPIA INCLUDING NUCLEAR ENLARGEMENT AND HURTHLE CELL CHANGE. SOME GROUPS ARE ARRANGED AS MICROFOLLICLES. THERE IS MINIMAL BACKGROUND COLLOID. BASED ON THESE FEATURES, A FOLLICULAR LESION/NEOPLASM CAN NOT BE ENTIRELY RULED OUT. A SPECIMEN WILL BE SENT FOR AFIRMA TESTING.   07/19/2017 Pathology Results   Lymph node, needle/core biopsy - METASTATIC PAPILLARY SEROUS CARCINOMA. - SEE COMMENT. Microscopic Comment Dr. Vicente Males has reviewed the case and concurs with this  interpretation   10/15/2017 PET scan   No new or progressive disease. No evidence of abdominal or pelvic metastatic disease.  Stable hypermetabolic right thyroid lobe nodule and adjacent right supraclavicular lymph node.  Decreased size and hypermetabolic activity of solitary right paratracheal lymph node.   01/23/2018 PET scan   1. Overall, no significant change from PET-CT of 3 months ago. There is persistent hypermetabolic activity at the right thoracic inlet and within a right paratracheal mediastinal node. The nodes have not significantly changed in size, although the metabolic activity has minimally increased over this interval. It is uncertain how much of the thoracic inlet metabolic activity is attributed to the thyroid nodule versus adjacent cervical lymph nodes, both previously biopsied. 2. No new hypermetabolic activity within the neck, chest, abdomen or pelvis. No evidence of local recurrence in the pelvis. 3. Stable probable radiation changes in the right lung.   04/16/2018 PET scan   1. Interval increase in metabolic activity of the RIGHT supraclavicular node and RIGHT lower paratracheal mediastinal node with minimal change in size. Findings consistent with persistent and mildly progressed metastatic adenopathy. 2. New hypermetabolic small lymph node in the RIGHT lower paratracheal nodal station adjacent to the previously followed node. 3. No evidence of local recurrence in the pelvis or new disease elsewhere.   07/30/2018 PET scan   1. Mild interval progression of right supraclavicular, mediastinal, and right hilar hypermetabolic metastatic disease. There is a new hypermetabolic lymph node in the subcarinal station on today's study. 2. No hypermetabolic disease in the abdomen or pelvis to suggest recurrent/metastases. 3.  Aortic Atherosclerois (ICD10-170.0)    08/09/2018 -  Chemotherapy   The patient had Lenvima since 3/6 and pembrolizumab since 3/13 for chemotherapy treatment.  Michel Santee was held temporarily due to infection and severe hypertension   10/31/2018 PET scan   Partial response to therapy, with decreased hypermetabolic lymphadenopathy in mediastinum and right hilar region.  No significant change in hypermetabolic lymphadenopathy in right inferior neck.  No new or progressive metastatic disease identified. 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      REVIEW OF SYSTEMS:   Constitutional: Denies fevers, chills or abnormal weight loss Eyes: Denies blurriness of vision Ears, nose, mouth, throat, and face: Denies mucositis or sore throat Respiratory: Denies cough, dyspnea or wheezes Cardiovascular: Denies palpitation, chest discomfort or lower extremity swelling Gastrointestinal:  Denies nausea, heartburn or change in bowel habits Skin: Denies abnormal skin rashes Lymphatics: Denies new lymphadenopathy or easy bruising Neurological:Denies numbness, tingling or new weaknesses Behavioral/Psych: Mood is stable, no new changes  All other systems were reviewed with the patient and are negative.  I have reviewed the past medical history, past surgical history, social history and family history with the patient and they are unchanged from previous note.  ALLERGIES:  has No Known Allergies.  MEDICATIONS:  Current Outpatient Medications  Medication Sig Dispense Refill  . amLODipine (NORVASC) 10 MG tablet TAKE 1 TABLET BY MOUTH DAILY. DECREASE SIMVASTATIN TO 20MG DAILY WHILE ON AMLODIPINE PER MD  90 tablet 1  . Calcium Carb-Cholecalciferol 500-600 MG-UNIT TABS Take 1 tablet by mouth daily.     . Cholecalciferol (VITAMIN D3) 2000 units TABS Take 2,000 Units by mouth daily.     . hydrochlorothiazide (HYDRODIURIL) 25 MG tablet Take 1 tablet (25 mg total) by mouth daily. 90 tablet 1  . lenvatinib 10 mg daily dose (LENVIMA, 10 MG DAILY DOSE,) capsule Take 1 capsule (10 mg total) by mouth daily. 30 capsule 11  . levothyroxine (SYNTHROID) 50 MCG tablet TAKE 1 TABLET (50 MCG TOTAL) BY MOUTH DAILY BEFORE BREAKFAST. 30 tablet 0  . LORazepam (ATIVAN) 0.5 MG tablet Take 0.5 mg by mouth 2 (two) times daily as needed.  0  . losartan (COZAAR) 50 MG tablet Take 1 tablet (50 mg total) by mouth daily. 90 tablet 11  . metoprolol tartrate (LOPRESSOR) 25 MG tablet Take 1 tablet (25 mg total) by mouth 2 (two) times daily. 180 tablet 3  . simvastatin (ZOCOR) 40 MG tablet Take 40 mg by mouth every evening.    . tacrolimus (PROTOPIC) 0.1 % ointment Apply 1 application topically daily as needed (rash).     . venlafaxine XR (EFFEXOR-XR) 75 MG 24 hr capsule Take 75 mg by mouth daily with breakfast.      No current facility-administered medications for this visit.    Facility-Administered Medications Ordered in Other Visits  Medication Dose Route Frequency Provider Last Rate Last Dose  . 0.9 %  sodium chloride infusion   Intravenous Once Alvy Bimler, Sierra Spargo, MD      . heparin lock flush 100 unit/mL  500 Units Intracatheter Once PRN Alvy Bimler, Harwood Nall, MD      . pembrolizumab (KEYTRUDA) 200 mg in sodium chloride 0.9 % 50 mL chemo infusion  200 mg Intravenous Once Dajon Rowe, MD      . sodium chloride flush (NS) 0.9 % injection 10 mL  10 mL Intracatheter PRN Alvy Bimler, Danford Tat, MD        PHYSICAL EXAMINATION: ECOG PERFORMANCE STATUS: 0 - Asymptomatic  Vitals:   12/02/18 1255  BP:  119/77  Pulse: 73  Resp: 18  Temp: 98.7 F (37.1 C)  SpO2: 98%   Filed Weights   12/02/18 1255  Weight: 162 lb 6.4 oz (73.7 kg)     GENERAL:alert, no distress and comfortable SKIN: skin color, texture, turgor are normal, no rashes or significant lesions EYES: normal, Conjunctiva are pink and non-injected, sclera clear OROPHARYNX:no exudate, no erythema and lips, buccal mucosa, and tongue normal  NECK: supple, thyroid normal size, non-tender, without nodularity LYMPH:  no palpable lymphadenopathy in the cervical, axillary or inguinal LUNGS: clear to auscultation and percussion with normal breathing effort HEART: regular rate & rhythm and no murmurs and no lower extremity edema ABDOMEN:abdomen soft, non-tender and normal bowel sounds Musculoskeletal:no cyanosis of digits and no clubbing  NEURO: alert & oriented x 3 with fluent speech, no focal motor/sensory deficits  LABORATORY DATA:  I have reviewed the data as listed    Component Value Date/Time   NA 137 12/02/2018 1235   NA 141 05/17/2017 0957   K 4.4 12/02/2018 1235   K 3.6 05/17/2017 0957   CL 102 12/02/2018 1235   CL 105 11/19/2012 0848   CO2 26 12/02/2018 1235   CO2 25 05/17/2017 0957   GLUCOSE 85 12/02/2018 1235   GLUCOSE 85 05/17/2017 0957   GLUCOSE 122 (H) 11/19/2012 0848   BUN 20 12/02/2018 1235   BUN 15.2 05/17/2017 0957   CREATININE 0.80 12/02/2018 1235   CREATININE 0.8 05/17/2017 0957   CALCIUM 9.3 12/02/2018 1235   CALCIUM 9.6 05/17/2017 0957   PROT 7.1 12/02/2018 1235   PROT 7.0 05/17/2017 0957   ALBUMIN 3.5 12/02/2018 1235   ALBUMIN 3.9 05/17/2017 0957   AST 30 12/02/2018 1235   AST 25 05/17/2017 0957   ALT 20 12/02/2018 1235   ALT 18 05/17/2017 0957   ALKPHOS 53 12/02/2018 1235   ALKPHOS 57 05/17/2017 0957   BILITOT 0.4 12/02/2018 1235   BILITOT 0.47 05/17/2017 0957   GFRNONAA >60 12/02/2018 1235   GFRAA >60 12/02/2018 1235    No results found for: SPEP, UPEP  Lab Results  Component Value Date   WBC 4.6 12/02/2018   NEUTROABS 3.2 12/02/2018   HGB 10.8 (L) 12/02/2018   HCT 32.3 (L) 12/02/2018   MCV 95.3 12/02/2018    PLT 159 12/02/2018      Chemistry      Component Value Date/Time   NA 137 12/02/2018 1235   NA 141 05/17/2017 0957   K 4.4 12/02/2018 1235   K 3.6 05/17/2017 0957   CL 102 12/02/2018 1235   CL 105 11/19/2012 0848   CO2 26 12/02/2018 1235   CO2 25 05/17/2017 0957   BUN 20 12/02/2018 1235   BUN 15.2 05/17/2017 0957   CREATININE 0.80 12/02/2018 1235   CREATININE 0.8 05/17/2017 0957   GLU 158 (H) 01/14/2015 1035      Component Value Date/Time   CALCIUM 9.3 12/02/2018 1235   CALCIUM 9.6 05/17/2017 0957   ALKPHOS 53 12/02/2018 1235   ALKPHOS 57 05/17/2017 0957   AST 30 12/02/2018 1235   AST 25 05/17/2017 0957   ALT 20 12/02/2018 1235   ALT 18 05/17/2017 0957   BILITOT 0.4 12/02/2018 1235   BILITOT 0.47 05/17/2017 0957      All questions were answered. The patient knows to call the clinic with any problems, questions or concerns. No barriers to learning was detected.  I spent 15 minutes counseling the patient face to face. The total time  spent in the appointment was 20 minutes and more than 50% was on counseling and review of test results  [name], MD 12/02/2018 1:59 PM

## 2018-12-02 NOTE — Assessment & Plan Note (Addendum)
Her blood pressure control is satisfactory She will continue the same She is instructed to monitor her blood pressure closely. Her proteinuria has improved since recent dose reduce lenvatinib

## 2018-12-02 NOTE — Telephone Encounter (Signed)
I left a message regarding schedule  

## 2018-12-06 MED FILL — SIMVASTATIN 20 MG TABLET: 20 | 90 days supply | Qty: 90 | Fill #1

## 2018-12-19 MED FILL — LEVOTHYROXINE 25 MCG TABLET: 25 | 30 days supply | Qty: 30 | Fill #1

## 2018-12-19 MED FILL — LOSARTAN POTASSIUM 50 MG TA: 50 | 30 days supply | Qty: 30 | Fill #3

## 2018-12-19 MED FILL — HYDROCHLOROTHIAZIDE 25 MG T: 25 | 30 days supply | Qty: 30 | Fill #1

## 2018-12-23 ENCOUNTER — Inpatient Hospital Stay (HOSPITAL_BASED_OUTPATIENT_CLINIC_OR_DEPARTMENT_OTHER): Payer: 59 | Admitting: Hematology and Oncology

## 2018-12-23 ENCOUNTER — Other Ambulatory Visit: Payer: Self-pay

## 2018-12-23 ENCOUNTER — Inpatient Hospital Stay: Payer: 59

## 2018-12-23 ENCOUNTER — Inpatient Hospital Stay: Payer: 59 | Attending: Hematology and Oncology

## 2018-12-23 ENCOUNTER — Other Ambulatory Visit: Payer: Self-pay | Admitting: Hematology and Oncology

## 2018-12-23 DIAGNOSIS — C770 Secondary and unspecified malignant neoplasm of lymph nodes of head, face and neck: Secondary | ICD-10-CM

## 2018-12-23 DIAGNOSIS — E039 Hypothyroidism, unspecified: Secondary | ICD-10-CM | POA: Insufficient documentation

## 2018-12-23 DIAGNOSIS — I10 Essential (primary) hypertension: Secondary | ICD-10-CM

## 2018-12-23 DIAGNOSIS — Z79899 Other long term (current) drug therapy: Secondary | ICD-10-CM | POA: Insufficient documentation

## 2018-12-23 DIAGNOSIS — Z5112 Encounter for antineoplastic immunotherapy: Secondary | ICD-10-CM | POA: Diagnosis not present

## 2018-12-23 DIAGNOSIS — C541 Malignant neoplasm of endometrium: Secondary | ICD-10-CM

## 2018-12-23 DIAGNOSIS — Z7189 Other specified counseling: Secondary | ICD-10-CM

## 2018-12-23 DIAGNOSIS — Z95828 Presence of other vascular implants and grafts: Secondary | ICD-10-CM

## 2018-12-23 DIAGNOSIS — E041 Nontoxic single thyroid nodule: Secondary | ICD-10-CM

## 2018-12-23 LAB — CBC WITH DIFFERENTIAL/PLATELET
Abs Immature Granulocytes: 0.01 10*3/uL (ref 0.00–0.07)
Basophils Absolute: 0 10*3/uL (ref 0.0–0.1)
Basophils Relative: 1 %
Eosinophils Absolute: 0.2 10*3/uL (ref 0.0–0.5)
Eosinophils Relative: 4 %
HCT: 32.1 % — ABNORMAL LOW (ref 36.0–46.0)
Hemoglobin: 10.9 g/dL — ABNORMAL LOW (ref 12.0–15.0)
Immature Granulocytes: 0 %
Lymphocytes Relative: 19 %
Lymphs Abs: 0.9 10*3/uL (ref 0.7–4.0)
MCH: 32.1 pg (ref 26.0–34.0)
MCHC: 34 g/dL (ref 30.0–36.0)
MCV: 94.4 fL (ref 80.0–100.0)
Monocytes Absolute: 0.4 10*3/uL (ref 0.1–1.0)
Monocytes Relative: 9 %
Neutro Abs: 3.1 10*3/uL (ref 1.7–7.7)
Neutrophils Relative %: 67 %
Platelets: 182 10*3/uL (ref 150–400)
RBC: 3.4 MIL/uL — ABNORMAL LOW (ref 3.87–5.11)
RDW: 12.8 % (ref 11.5–15.5)
WBC: 4.7 10*3/uL (ref 4.0–10.5)
nRBC: 0 % (ref 0.0–0.2)

## 2018-12-23 LAB — COMPREHENSIVE METABOLIC PANEL
ALT: 15 U/L (ref 0–44)
AST: 22 U/L (ref 15–41)
Albumin: 3.6 g/dL (ref 3.5–5.0)
Alkaline Phosphatase: 56 U/L (ref 38–126)
Anion gap: 9 (ref 5–15)
BUN: 25 mg/dL — ABNORMAL HIGH (ref 8–23)
CO2: 26 mmol/L (ref 22–32)
Calcium: 9.7 mg/dL (ref 8.9–10.3)
Chloride: 101 mmol/L (ref 98–111)
Creatinine, Ser: 0.86 mg/dL (ref 0.44–1.00)
GFR calc Af Amer: >60 mL/min (ref >60)
GFR calc non Af Amer: >60 mL/min (ref >60)
Glucose, Bld: 81 mg/dL (ref 70–99)
Potassium: 4.1 mmol/L (ref 3.5–5.1)
Sodium: 136 mmol/L (ref 135–145)
Total Bilirubin: 0.5 mg/dL (ref 0.3–1.2)
Total Protein: 7.2 g/dL (ref 6.5–8.1)

## 2018-12-23 LAB — TOTAL PROTEIN, URINE DIPSTICK: Protein, ur: NEGATIVE mg/dL

## 2018-12-23 LAB — TSH: TSH: 3.887 u[IU]/mL (ref 0.308–3.960)

## 2018-12-23 MED ORDER — SODIUM CHLORIDE 0.9 % IV SOLN
Freq: Once | INTRAVENOUS | Status: AC
  Administered 2018-12-23: 13:00:00 via INTRAVENOUS
  Filled 2018-12-23: qty 250

## 2018-12-23 MED ORDER — SODIUM CHLORIDE 0.9% FLUSH
10.0000 mL | Freq: Once | INTRAVENOUS | Status: AC
  Administered 2018-12-23: 10 mL
  Filled 2018-12-23: qty 10

## 2018-12-23 MED ORDER — SODIUM CHLORIDE 0.9 % IV SOLN
200.0000 mg | Freq: Once | INTRAVENOUS | Status: AC
  Administered 2018-12-23: 200 mg via INTRAVENOUS
  Filled 2018-12-23: qty 8

## 2018-12-23 MED ORDER — AMLODIPINE BESYLATE 10 MG PO TABS: ORAL_TABLET | ORAL | 1 refills | Status: DC

## 2018-12-23 MED ORDER — METOPROLOL TARTRATE 25 MG PO TABS: 25.0000 mg | ORAL_TABLET | Freq: Two times a day (BID) | ORAL | 3 refills | Status: DC

## 2018-12-23 MED ORDER — HEPARIN SOD (PORK) LOCK FLUSH 100 UNIT/ML IV SOLN
500.0000 [IU] | Freq: Once | INTRAVENOUS | Status: AC | PRN
  Administered 2018-12-23: 500 [IU]
  Filled 2018-12-23: qty 5

## 2018-12-23 MED ORDER — SODIUM CHLORIDE 0.9% FLUSH
10.0000 mL | INTRAVENOUS | Status: DC | PRN
  Administered 2018-12-23: 10 mL
  Filled 2018-12-23: qty 10

## 2018-12-23 NOTE — Patient Instructions (Signed)
[facility] Discharge Instructions for Patients Receiving Chemotherapy  Today you received the following chemotherapy agent: Keytruda.  To help prevent nausea and vomiting after your treatment, we encourage you to take your nausea medication as directed.   If you develop nausea and vomiting that is not controlled by your nausea medication, call the clinic.   BELOW ARE SYMPTOMS THAT SHOULD BE REPORTED IMMEDIATELY:  *FEVER GREATER THAN 100.5 F  *CHILLS WITH OR WITHOUT FEVER  NAUSEA AND VOMITING THAT IS NOT CONTROLLED WITH YOUR NAUSEA MEDICATION  *UNUSUAL SHORTNESS OF BREATH  *UNUSUAL BRUISING OR BLEEDING  TENDERNESS IN MOUTH AND THROAT WITH OR WITHOUT PRESENCE OF ULCERS  *URINARY PROBLEMS  *BOWEL PROBLEMS  UNUSUAL RASH Items with * indicate a potential emergency and should be followed up as soon as possible.  Feel free to call the clinic should you have any questions or concerns. The clinic phone number is [phone].  Please show the CHEMO ALERT CARD at check-in to the Emergency Department and triage nurse.   

## 2018-12-24 ENCOUNTER — Encounter: Payer: Self-pay | Admitting: Hematology and Oncology

## 2018-12-24 ENCOUNTER — Other Ambulatory Visit: Payer: Self-pay | Admitting: Hematology and Oncology

## 2018-12-24 MED FILL — METOPROLOL TARTRATE 25 MG T: 25 | 90 days supply | Qty: 180 | Fill #0

## 2018-12-24 MED FILL — LEVOTHYROXINE 50 MCG TABLET: 50 | 90 days supply | Qty: 90 | Fill #0

## 2018-12-24 MED FILL — AMLODIPINE BESYLATE 10 MG T: 10 | 90 days supply | Qty: 90 | Fill #0

## 2018-12-24 NOTE — Assessment & Plan Note (Signed)
Her TSH is currently within normal range She will continue current prescription levothyroxine and I will adjust her medication accordingly

## 2018-12-24 NOTE — Assessment & Plan Note (Signed)
Her blood pressure control is satisfactory She will continue the same She is instructed to monitor her blood pressure closely. Her proteinuria has improved since recent dose reduce lenvatinib

## 2018-12-24 NOTE — Progress Notes (Signed)
[place] OFFICE PROGRESS NOTE  Patient Care Team: [name], MD as PCP - General (Family Medicine) Reynold Bowen, MD as Consulting Physician (Endocrinology)  ASSESSMENT & PLAN:  Endometrial cancer [facility]) Her last imaging study showed near complete resolution of lymphadenopathy in her chest The right neck persistent uptake is stable She will continue a few more months of treatment with reduced dose lenvatinib Plan to repeat imaging study again probably around end of August  Acquired hypothyroidism Her TSH is currently within normal range She will continue current prescription levothyroxine and I will adjust her medication accordingly  Essential hypertension Her blood pressure control is satisfactory She will continue the same She is instructed to monitor her blood pressure closely. Her proteinuria has improved since recent dose reduce lenvatinib   No orders of the defined types were placed in this encounter.   INTERVAL HISTORY: Please see below for problem oriented charting. She returns for further follow-up Denies infusion reaction Her blood pressure control at home is good No recent new lymphadenopathy Overall, she tolerated recent treatment very well  SUMMARY OF ONCOLOGIC HISTORY: Oncology History Overview Note  [employer] testing done 11-2015 on surgical path from 2014: MS stable TMB low 4 muts/mb ATM [id number] ERBB3 T389K E2H2 rearrangement exon 9 PPP2R1A P179R TP53 I195N    ER- APPROXIMATELY 25-35% STAINING IN NEOPLASTIC CELLS (INTERMEDIATE)  PR- APPROXIMATELY 25-35% STAINING IN NEOPLASTIC CELLS (STRONG)  Repeat biopsy 04/02/17: ER negative, Her 2 negative  Progressed on Doxil   Endometrial cancer ([place])  06/20/2012 Pathology Results   Biopsy positive for papillary serous carcinoma   06/20/2012 Genetic Testing   Foundation One testing done 11-2015 on surgical path from 2014: MS stable TMB low 4 muts/mb ATM  [id number] ERBB3 T389K E2H2 rearrangement exon 9 PPP2R1A P179R TP53 I195N    06/20/2012 Initial Diagnosis   Patient presented to PCP with intermittent vaginal bleeding since ~ Oct 2013, endometrial biopsy 06-20-12 with complex endometrial hyperplasia with atypia   06/26/2012 Imaging   Thickened endometrial lining in a postmenopausal patient experiencing vaginal bleeding. In the setting of post-menopausal bleeding, endometrial sampling is indicated to exclude carcinoma. No focal myometrial abnormalities are seen.  Normal left ovary and non-visualized right ovary   07/30/2012 Surgery   Dr. Polly Cobia performed robotic hysterectomy with bilateral salpingo-oophorectomy, bilateral pelvic lymph node dissection and periaortic lymph node dissection. Intraoperatively on frozen section, the patient was noted to have a large endometrial polyp with changes within the polyp consistent for high-grade malignancy, possibly papillary serous carcinoma. There is no obvious extrauterine disease noted.     07/30/2012 Pathology Results   [phone] SUPPLEMENTAL REPORT  THE ENDOMETRIAL CARCINOMA WAS ANALYZED FOR DNA MISMATCH REPAIR PROTEINS.IMMUNOHISTOCHEMICALLY, THE NEOPLASM RETAINED NUCLEAR EXPRESSION OF 4 GENE PRODUCTS, MLH1, MSH2, MSH6, AND PMS2, INVOLVED IN DNA MISMATCH REPAIR.POSITIVE AND NEGATIVE CONTROLS WORKED APPROPRIATELY.  PER REQUEST, AN ER AND PR ARE PERFORMED ON BLOCK 1I.  ER- APPROXIMATELY 25-35% STAINING IN NEOPLASTIC CELLS (INTERMEDIATE)  PR- APPROXIMATELY 25-35% STAINING IN NEOPLASTIC CELLS (STRONG)  ER AND PR PREDOMINANTLY SHOW STAINING IN THE SEROUS COMPONENT.  DIAGNOSIS  1. UTERUS, CERVIX WITH BILATERAL FALLOPIAN TUBES AND OVARIES,  HYSTERECTOMY AND BILATERAL SALPINGO-OOPHORECTOMY:  HIGH GRADE/POORLY DIFFERENTIATED ENDOMETRIAL ADENOCARCINOMA WITH SOLID AND SEROUS PAPILLARY COMPONENTS.  HISTOPATHOLOGIC TYPE:A VARIETY OF PATTERNS WERE PRESENT, ALL OF WHICH SHOULD BE CONSIDERED TO  BE HIGH GRADE.SEROUS PAPILLARY CARCINOMA WAS SEEN OCCURRING ADJACENT TO SOLID ENDOMETRIAL CARCINOMA WITH MARKED ANAPLASIA AND GIANT CELLS. SIZE:TUMOR MEASURED AT LEAST 4.8 CM IN GREATEST HORIZONTAL  DIMENSION.  GRADE: POORLY DIFFERENTIATED OR HIGH GRADE. DEPTH OF INVASION: NO DEFINITE MYOMETRIAL INVOLVEMENT WAS SEEN.THE TUMOR APPEARED CONFINED TO THE POLYP AS WELL AS SURFACE ENDOMETRIUM. WHERE MYOMETRIAL THICKNESS NOT APPLICABLE. SEROSAL INVOLVEMENT: NOT DEMONSTRATED.  ENDOCERVICAL INVOLVEMENT:NOT DEMONSTRATED. RESECTION MARGINS: FREE OF INVOLVEMENT. EXTRAUTERINE EXTENSION:NOT DEMONSTRATED. ANGIOLYMPHATIC INVASION: NOT DEFINITELY SEEN. TOTAL NODES EXAMINED:22.  PELVIC NODES EXAMINED:20.  PELVIC NODES INVOLVED:0.  PARA-AORTIC NODES 2. EXAMINED:  PARA-AORTIC NODES 0. INVOLVED TNM STAGE: T1A N0 MX. AJCC STAGE GROUPING: IA. FIGO STAGE:IA.   08/26/2012 Imaging   No CT evidence for intra-abdominal or pelvic metastatic disease. Trace free pelvic fluid, presumably postoperative although the date of surgery is not documented in the electronic medical record.   08/26/2012 - 12/13/2012 Chemotherapy   She received 6 cycles of carbo/taxol    10/29/2012 - 11/27/2012 Radiation Therapy   May 27, June 5,  June 11, June 19, November 27, 2012: Proximal vagina 30 Gy in 5 fractions     01/17/2013 Imaging   1.  No evidence of recurrent or metastatic disease. 2.  No acute abnormality involving the abdomen or pelvis. 3.  Mild diffuse hepatic steatosis. 4.  Very small supraumbilical midline anterior abdominal wall hernia containing fat, unchanged   01/22/2014 Imaging   No evidence for metastatic or recurrent disease. 2. No bowel obstruction.  Normal appendix. 3. Small fat containing hernia, stable in appearance. 4. Status post hysterectomy and bilateral oophorectomy   02/19/2014 Imaging   No pulmonary lesions are  identified. The abnormality on the chest x-ray is due to asymmetric left-sided sternoclavicular joint degenerative disease   10/12/2014 Imaging   New mild retroperitoneal lymphadenopathy in the left paraaortic region and proximal left common iliac chain, consistent with metastatic disease. No other sites of metastatic disease identified within the abdomen or pelvis.     11/27/2014 - 02/11/2015 Chemotherapy   She received 4 cycles of carbo/taxol    03/01/2015 PET scan   Single hypermetabolic small retroperitoneal lymph node along the aorta. 2. No evidence of metastatic disease otherwise in the abdomen or pelvis. No evidence local recurrence. 3. Intensely hypermetabolic enlarged nodule adjacent to the RIGHT lobe of thyroid gland. This presumably represents the biopsied lesion in clinician report which was found to be benign thyroid tissue.   04/05/2015 - 05/13/2015 Radiation Therapy   She received 50.4 gray in 28 fractions with simultaneous integrated boost to 56 gray   06/17/2015 Imaging   No acute process or evidence of metastatic disease in the abdomen or pelvis. Resolution of previously described retroperitoneal adenopathy. 2.  Possible constipation. 3. Atherosclerosis.   06/17/2015 Tumor Marker   Patient's tumor was tested for the following markers: CA125 Results of the tumor marker test revealed 33   08/12/2015 Tumor Marker   Patient's tumor was tested for the following markers: CA125 Results of the tumor marker test revealed 52   08/31/2015 PET scan   Development of right paratracheal hypermetabolic adenopathy, consistent with nodal metastasis. 2. The previously described isolated abdominal retroperitoneal hypermetabolic node has resolved. 3. Persistent hypermetabolic right thyroid nodule, per report previously biopsied. Correlate with those results. 4.  Possible constipation.   09/09/2015 -  Anti-estrogen oral therapy   She has been receiving alternative treatment between megace and  tamoxifen   09/09/2015 Tumor Marker   Patient's tumor was tested for the following markers: CA125 Results of the tumor marker test revealed 37.8   09/20/2015 Procedure   Technically successful ultrasound-guided thyroid aspiration biopsy , dominant right nodule   09/20/2015 Pathology  Results   THYROID, RIGHT, FINE NEEDLE ASPIRATION (SPECIMEN 1 OF 1 COLLECTED 09/20/15): FINDINGS CONSISTENT WITH BENIGN THYROID NODULE (BETHESDA CATEGORY II).   10/28/2015 Tumor Marker   Patient's tumor was tested for the following markers: CA125 Results of the tumor marker test revealed 53.2   11/04/2015 Imaging   Stable benign right thyroid nodule   12/16/2015 Tumor Marker   Patient's tumor was tested for the following markers: CA125 Results of the tumor marker test revealed 38.1   03/22/2016 PET scan   Interval progression of hypermetabolic right paratracheal lymph node consistent with metastatic involvement. 2. Stable hypermetabolic right thyroid nodule. Reportedly this has been biopsied in the past.   03/23/2016 Tumor Marker   Patient's tumor was tested for the following markers: CA125 Results of the tumor marker test revealed 62.4   04/13/2016 - 06/01/2016 Radiation Therapy   She received 56 Gy to the chest in 28 fractions    05/09/2016 Imaging   No evidence of left lower extremity deep vein thrombosis. No evidence of a superficial thrombosis of the greater and lesser saphenous veins. Positive for thrombus noted in several varicosities of the calf. No evidence of Baker's cyst on the left.   05/17/2016 Tumor Marker   Patient's tumor was tested for the following markers: CA125 Results of the tumor marker test revealed 9   06/29/2016 Tumor Marker   Patient's tumor was tested for the following markers: CA125 Results of the tumor marker test revealed 5.9   08/04/2016 Tumor Marker   Patient's tumor was tested for the following markers: CA125 Results of the tumor marker test revealed 6.0   09/12/2016 PET  scan   Complete metabolic response to therapy, with resolution of hypermetabolic mediastinal lymphadenopathy since prior exam. No residual or new metastatic disease identified. Stable hypermetabolic right thyroid lobe nodule, which was previously biopsied on 09/20/2015.   03/13/2017 PET scan   1. Solitary focus of recurrent right paratracheal hypermetabolic activity, with a 1.0 cm right lower paratracheal node having a maximum SUV of 9.0. Appearance compatible with recurrent malignancy. 2. Continued hypermetabolic right thyroid nodule, previously biopsied, presumed benign -correlate with prior biopsy results. 3. Other imaging findings of potential clinical significance: Aortic Atherosclerosis (ICD10-I70.0). Mild cardiomegaly. Prominent stool throughout the colon favors constipation.   04/02/2017 Pathology Results   FINE NEEDLE ASPIRATION, ENDOSCOPIC, (EBUS) 4 R NODE (SPECIMEN 1 OF 2 COLLECTED 04/02/17): MALIGNANT CELLS PRESENT, CONSISTENT WITH CARCINOMA. SEE COMMENT. COMMENT: THE MALIGNANT CELLS ARE POSITIVE FOR P53 AND NEGATIVE FOR ESTROGEN RECEPTOR AND TTF-1. THIS PROFILE IS NON-SPECIFIC, BUT THE P53 POSITIVE STAINING IS SUGGESTIVE OF GYNECOLOGIC PRIMARY.    04/11/2017 Procedure   Successful 8 French right internal jugular vein power port placement with its tip at the SVC/RA junction   04/12/2017 Imaging   Normal LV size with mild LV hypertrophy. EF 55-60%. Normal RV size and systolic function. Aortic valve sclerosis without significant stenosis.   04/18/2017 - 06/14/2017 Chemotherapy   The patient had chemotherapy with Doxil    07/10/2017 Imaging   - Left ventricle: The cavity size was normal. There was mild concentric hypertrophy. Systolic function was normal. The estimated ejection fraction was in the range of 55% to 60%. Wall motion was normal; there were no regional wall motion abnormalities. Doppler parameters are consistent with abnormal left ventricular relaxation (grade 1 diastolic  dysfunction). - Aortic valve: Noncoronary cusp mobility was mildly restricted. - Mitral valve: There was mild regurgitation. - Left atrium: The atrium was mildly dilated. - Atrial septum: No  defect or patent foramen ovale was identified.  Impressions:  - Compared to November 2018, global LV longitudinal strain remains normal (has increased)   07/11/2017 PET scan   Increased size and hypermetabolic activity of 3.3 cm right thyroid lobe nodule. Thyroid carcinoma cannot be excluded. Recommend repeat ultrasound guided fine needle aspiration to exclude thyroid carcinoma.  New adjacent hypermetabolic 10 mm right supraclavicular lymph node, suspicious for lymph node metastasis.  Slight increase in size and hypermetabolic activity of solitary right paratracheal lymph node.  No evidence of abdominal or pelvic metastatic disease.   07/18/2017 Procedure   1. Technically successful ultrasound guided fine needle aspiration of indeterminate hypermetabolic right-sided thyroid nodule/mass. 2. Technically successful ultrasound-guided core needle biopsy of hypermetabolic right lower cervical lymph node.   07/18/2017 Pathology Results   THYROID, FINE NEEDLE ASPIRATION RIGHT (SPECIMEN 1 OF 1, COLLECTED ON 07/18/2017): ATYPIA OF UNDETERMINED SIGNIFICANCE OR FOLLICULAR LESION OF UNDETERMINED SIGNIFICANCE (BETHESDA CATEGORY III). SEE COMMENT. COMMENT: THE SPECIMEN CONSISTS OF SMALL AND MEDIUM SIZED GROUPS OF FOLLICULAR EPITHELIAL CELLS WITH MILD CYTOLOGIC ATYPIA INCLUDING NUCLEAR ENLARGEMENT AND HURTHLE CELL CHANGE. SOME GROUPS ARE ARRANGED AS MICROFOLLICLES. THERE IS MINIMAL BACKGROUND COLLOID. BASED ON THESE FEATURES, A FOLLICULAR LESION/NEOPLASM CAN NOT BE ENTIRELY RULED OUT. A SPECIMEN WILL BE SENT FOR AFIRMA TESTING.   07/19/2017 Pathology Results   Lymph node, needle/core biopsy - METASTATIC PAPILLARY SEROUS CARCINOMA. - SEE COMMENT. Microscopic Comment Dr. Vicente Males has reviewed the case and concurs  with this interpretation   10/15/2017 PET scan   No new or progressive disease. No evidence of abdominal or pelvic metastatic disease.  Stable hypermetabolic right thyroid lobe nodule and adjacent right supraclavicular lymph node.  Decreased size and hypermetabolic activity of solitary right paratracheal lymph node.   01/23/2018 PET scan   1. Overall, no significant change from PET-CT of 3 months ago. There is persistent hypermetabolic activity at the right thoracic inlet and within a right paratracheal mediastinal node. The nodes have not significantly changed in size, although the metabolic activity has minimally increased over this interval. It is uncertain how much of the thoracic inlet metabolic activity is attributed to the thyroid nodule versus adjacent cervical lymph nodes, both previously biopsied. 2. No new hypermetabolic activity within the neck, chest, abdomen or pelvis. No evidence of local recurrence in the pelvis. 3. Stable probable radiation changes in the right lung.   04/16/2018 PET scan   1. Interval increase in metabolic activity of the RIGHT supraclavicular node and RIGHT lower paratracheal mediastinal node with minimal change in size. Findings consistent with persistent and mildly progressed metastatic adenopathy. 2. New hypermetabolic small lymph node in the RIGHT lower paratracheal nodal station adjacent to the previously followed node. 3. No evidence of local recurrence in the pelvis or new disease elsewhere.   07/30/2018 PET scan   1. Mild interval progression of right supraclavicular, mediastinal, and right hilar hypermetabolic metastatic disease. There is a new hypermetabolic lymph node in the subcarinal station on today's study. 2. No hypermetabolic disease in the abdomen or pelvis to suggest recurrent/metastases. 3.  Aortic Atherosclerois (ICD10-170.0)    08/09/2018 -  Chemotherapy   The patient had Lenvima since 3/6 and pembrolizumab since 3/13 for chemotherapy  treatment. Michel Santee was held temporarily due to infection and severe hypertension   10/31/2018 PET scan   Partial response to therapy, with decreased hypermetabolic lymphadenopathy in mediastinum and right hilar region.  No significant change in hypermetabolic lymphadenopathy in right inferior neck.  No new or progressive metastatic  disease identified. 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      REVIEW OF SYSTEMS:   Constitutional: Denies fevers, chills or abnormal weight loss Eyes: Denies blurriness of vision Ears, nose, mouth, throat, and face: Denies mucositis or sore throat Respiratory: Denies cough, dyspnea or wheezes Cardiovascular: Denies palpitation, chest discomfort or lower extremity swelling Gastrointestinal:  Denies nausea, heartburn or change in bowel habits Skin: Denies abnormal skin rashes Lymphatics: Denies new lymphadenopathy or easy bruising Neurological:Denies numbness, tingling or new weaknesses Behavioral/Psych: Mood is stable, no new changes  All other systems were reviewed with the patient and are negative.  I have reviewed the past medical history, past surgical history, social history and family history with the patient and they are unchanged from previous note.  ALLERGIES:  has No Known Allergies.  MEDICATIONS:  Current Outpatient Medications  Medication Sig Dispense Refill  . amLODipine (NORVASC) 10 MG tablet TAKE 1 TABLET BY MOUTH DAILY. DECREASE  SIMVASTATIN TO 20MG DAILY WHILE ON AMLODIPINE PER MD 90 tablet 1  . Calcium Carb-Cholecalciferol 500-600 MG-UNIT TABS Take 1 tablet by mouth daily.     . Cholecalciferol (VITAMIN D3) 2000 units TABS Take 2,000 Units by mouth daily.     . hydrochlorothiazide (HYDRODIURIL) 50 MG tablet Take 1 tablet (50 mg total) by mouth daily. 90 tablet 11  . lenvatinib 10 mg daily dose (LENVIMA, 10 MG DAILY DOSE,) capsule Take 1 capsule (10 mg total) by mouth daily. 30 capsule 11  . levothyroxine (SYNTHROID) 50 MCG tablet TAKE 1 TABLET (50 MCG TOTAL) BY MOUTH DAILY BEFORE BREAKFAST. 90 tablet 3  . LORazepam (ATIVAN) 0.5 MG tablet Take 0.5 mg by mouth 2 (two) times daily as needed.  0  . losartan (COZAAR) 50 MG tablet Take 1 tablet (50 mg total) by mouth daily. 90 tablet 11  . metoprolol tartrate (LOPRESSOR) 25 MG tablet Take 1 tablet (25 mg total) by mouth 2 (two) times daily. 180 tablet 3  . simvastatin (ZOCOR) 40 MG tablet Take 40 mg by mouth every evening.    . tacrolimus (PROTOPIC) 0.1 % ointment Apply 1 application topically daily as needed (rash).     . venlafaxine XR (EFFEXOR-XR) 75 MG 24 hr capsule Take 75 mg by mouth daily with breakfast.      No current facility-administered medications for this visit.     PHYSICAL EXAMINATION: ECOG PERFORMANCE STATUS: 0 - Asymptomatic  Vitals:   12/23/18 1231  BP: 120/66  Pulse: 67  Resp: 18  Temp: 99.1 F (37.3 C)  SpO2: 99%   Filed Weights   12/23/18 1231  Weight: 159 lb 6.4 oz (72.3 kg)    GENERAL:alert, no distress and comfortable SKIN: skin color, texture, turgor are normal, no rashes or significant lesions EYES: normal, Conjunctiva are pink and non-injected, sclera clear OROPHARYNX:no exudate, no erythema and lips, buccal mucosa, and tongue normal  NECK: supple, thyroid normal size, non-tender, without nodularity LYMPH:  no palpable lymphadenopathy in the cervical, axillary or inguinal LUNGS: clear to auscultation and  percussion with normal  breathing effort HEART: regular rate & rhythm and no murmurs and no lower extremity edema ABDOMEN:abdomen soft, non-tender and normal bowel sounds Musculoskeletal:no cyanosis of digits and no clubbing  NEURO: alert & oriented x 3 with fluent speech, no focal motor/sensory deficits  LABORATORY DATA:  I have reviewed the data as listed    Component Value Date/Time   NA 136 12/23/2018 1218   NA 141 05/17/2017 0957   K 4.1 12/23/2018 1218   K 3.6 05/17/2017 0957   CL 101 12/23/2018 1218   CL 105 11/19/2012 0848   CO2 26 12/23/2018 1218   CO2 25 05/17/2017 0957   GLUCOSE 81 12/23/2018 1218   GLUCOSE 85 05/17/2017 0957   GLUCOSE 122 (H) 11/19/2012 0848   BUN 25 (H) 12/23/2018 1218   BUN 15.2 05/17/2017 0957   CREATININE 0.86 12/23/2018 1218   CREATININE 0.8 05/17/2017 0957   CALCIUM 9.7 12/23/2018 1218   CALCIUM 9.6 05/17/2017 0957   PROT 7.2 12/23/2018 1218   PROT 7.0 05/17/2017 0957   ALBUMIN 3.6 12/23/2018 1218   ALBUMIN 3.9 05/17/2017 0957   AST 22 12/23/2018 1218   AST 25 05/17/2017 0957   ALT 15 12/23/2018 1218   ALT 18 05/17/2017 0957   ALKPHOS 56 12/23/2018 1218   ALKPHOS 57 05/17/2017 0957   BILITOT 0.5 12/23/2018 1218   BILITOT 0.47 05/17/2017 0957   GFRNONAA >60 12/23/2018 1218   GFRAA >60 12/23/2018 1218    No results found for: SPEP, UPEP  Lab Results  Component Value Date   WBC 4.7 12/23/2018   NEUTROABS 3.1 12/23/2018   HGB 10.9 (L) 12/23/2018   HCT 32.1 (L) 12/23/2018   MCV 94.4 12/23/2018   PLT 182 12/23/2018      Chemistry      Component Value Date/Time   NA 136 12/23/2018 1218   NA 141 05/17/2017 0957   K 4.1 12/23/2018 1218   K 3.6 05/17/2017 0957   CL 101 12/23/2018 1218   CL 105 11/19/2012 0848   CO2 26 12/23/2018 1218   CO2 25 05/17/2017 0957   BUN 25 (H) 12/23/2018 1218   BUN 15.2 05/17/2017 0957   CREATININE 0.86 12/23/2018 1218   CREATININE 0.8 05/17/2017 0957   GLU 158 (H) 01/14/2015 1035      Component Value Date/Time    CALCIUM 9.7 12/23/2018 1218   CALCIUM 9.6 05/17/2017 0957   ALKPHOS 56 12/23/2018 1218   ALKPHOS 57 05/17/2017 0957   AST 22 12/23/2018 1218   AST 25 05/17/2017 0957   ALT 15 12/23/2018 1218   ALT 18 05/17/2017 0957   BILITOT 0.5 12/23/2018 1218   BILITOT 0.47 05/17/2017 0957      All questions were answered. The patient knows to call the clinic with any problems, questions or concerns. No barriers to learning was detected.  I spent 15 minutes counseling the patient face to face. The total time spent in the appointment was 20 minutes and more than 50% was on counseling and review of test results  [name], MD 12/24/2018 2:46 PM

## 2018-12-24 NOTE — Assessment & Plan Note (Addendum)
Her last imaging study showed near complete resolution of lymphadenopathy in her chest The right neck persistent uptake is stable She will continue a few more months of treatment with reduced dose lenvatinib Plan to repeat imaging study again probably around end of August

## 2019-01-02 MED FILL — LENVIMA 10 MG DAILY DOSE: 10 | 30 days supply | Qty: 30 | Fill #2

## 2019-01-13 ENCOUNTER — Inpatient Hospital Stay: Payer: 59

## 2019-01-13 ENCOUNTER — Inpatient Hospital Stay: Payer: 59 | Attending: Hematology and Oncology

## 2019-01-13 ENCOUNTER — Other Ambulatory Visit: Payer: Self-pay

## 2019-01-13 ENCOUNTER — Inpatient Hospital Stay (HOSPITAL_BASED_OUTPATIENT_CLINIC_OR_DEPARTMENT_OTHER): Payer: 59 | Admitting: Hematology and Oncology

## 2019-01-13 DIAGNOSIS — E041 Nontoxic single thyroid nodule: Secondary | ICD-10-CM | POA: Diagnosis not present

## 2019-01-13 DIAGNOSIS — Z9079 Acquired absence of other genital organ(s): Secondary | ICD-10-CM | POA: Insufficient documentation

## 2019-01-13 DIAGNOSIS — R946 Abnormal results of thyroid function studies: Secondary | ICD-10-CM | POA: Diagnosis not present

## 2019-01-13 DIAGNOSIS — Z90722 Acquired absence of ovaries, bilateral: Secondary | ICD-10-CM | POA: Insufficient documentation

## 2019-01-13 DIAGNOSIS — C541 Malignant neoplasm of endometrium: Secondary | ICD-10-CM

## 2019-01-13 DIAGNOSIS — Z90710 Acquired absence of both cervix and uterus: Secondary | ICD-10-CM | POA: Diagnosis not present

## 2019-01-13 DIAGNOSIS — C770 Secondary and unspecified malignant neoplasm of lymph nodes of head, face and neck: Secondary | ICD-10-CM

## 2019-01-13 DIAGNOSIS — L271 Localized skin eruption due to drugs and medicaments taken internally: Secondary | ICD-10-CM

## 2019-01-13 DIAGNOSIS — I10 Essential (primary) hypertension: Secondary | ICD-10-CM | POA: Diagnosis not present

## 2019-01-13 DIAGNOSIS — Z5112 Encounter for antineoplastic immunotherapy: Secondary | ICD-10-CM | POA: Diagnosis not present

## 2019-01-13 DIAGNOSIS — E039 Hypothyroidism, unspecified: Secondary | ICD-10-CM | POA: Diagnosis not present

## 2019-01-13 DIAGNOSIS — Z95828 Presence of other vascular implants and grafts: Secondary | ICD-10-CM

## 2019-01-13 DIAGNOSIS — Z923 Personal history of irradiation: Secondary | ICD-10-CM | POA: Insufficient documentation

## 2019-01-13 DIAGNOSIS — Z7189 Other specified counseling: Secondary | ICD-10-CM

## 2019-01-13 DIAGNOSIS — Z79899 Other long term (current) drug therapy: Secondary | ICD-10-CM | POA: Insufficient documentation

## 2019-01-13 LAB — CBC WITH DIFFERENTIAL/PLATELET
Abs Immature Granulocytes: 0.01 10*3/uL (ref 0.00–0.07)
Basophils Absolute: 0 10*3/uL (ref 0.0–0.1)
Basophils Relative: 1 %
Eosinophils Absolute: 0.3 10*3/uL (ref 0.0–0.5)
Eosinophils Relative: 6 %
HCT: 33.5 % — ABNORMAL LOW (ref 36.0–46.0)
Hemoglobin: 11.2 g/dL — ABNORMAL LOW (ref 12.0–15.0)
Immature Granulocytes: 0 %
Lymphocytes Relative: 18 %
Lymphs Abs: 0.8 10*3/uL (ref 0.7–4.0)
MCH: 32.5 pg (ref 26.0–34.0)
MCHC: 33.4 g/dL (ref 30.0–36.0)
MCV: 97.1 fL (ref 80.0–100.0)
Monocytes Absolute: 0.4 10*3/uL (ref 0.1–1.0)
Monocytes Relative: 8 %
Neutro Abs: 3 10*3/uL (ref 1.7–7.7)
Neutrophils Relative %: 67 %
Platelets: 199 10*3/uL (ref 150–400)
RBC: 3.45 MIL/uL — ABNORMAL LOW (ref 3.87–5.11)
RDW: 13.1 % (ref 11.5–15.5)
WBC: 4.4 10*3/uL (ref 4.0–10.5)
nRBC: 0 % (ref 0.0–0.2)

## 2019-01-13 LAB — COMPREHENSIVE METABOLIC PANEL
ALT: 22 U/L (ref 0–44)
AST: 25 U/L (ref 15–41)
Albumin: 3.6 g/dL (ref 3.5–5.0)
Alkaline Phosphatase: 57 U/L (ref 38–126)
Anion gap: 13 (ref 5–15)
BUN: 23 mg/dL (ref 8–23)
CO2: 24 mmol/L (ref 22–32)
Calcium: 9.4 mg/dL (ref 8.9–10.3)
Chloride: 102 mmol/L (ref 98–111)
Creatinine, Ser: 0.92 mg/dL (ref 0.44–1.00)
GFR calc Af Amer: >60 mL/min (ref >60)
GFR calc non Af Amer: >60 mL/min (ref >60)
Glucose, Bld: 95 mg/dL (ref 70–99)
Potassium: 4.3 mmol/L (ref 3.5–5.1)
Sodium: 139 mmol/L (ref 135–145)
Total Bilirubin: 0.4 mg/dL (ref 0.3–1.2)
Total Protein: 7.2 g/dL (ref 6.5–8.1)

## 2019-01-13 LAB — TOTAL PROTEIN, URINE DIPSTICK: Protein, ur: <30 mg/dL — AB

## 2019-01-13 LAB — TSH: TSH: 4.248 u[IU]/mL — ABNORMAL HIGH (ref 0.308–3.960)

## 2019-01-13 MED ORDER — SODIUM CHLORIDE 0.9% FLUSH
10.0000 mL | INTRAVENOUS | Status: DC | PRN
  Administered 2019-01-13: 10 mL
  Filled 2019-01-13: qty 10

## 2019-01-13 MED ORDER — SODIUM CHLORIDE 0.9 % IV SOLN
200.0000 mg | Freq: Once | INTRAVENOUS | Status: AC
  Administered 2019-01-13: 200 mg via INTRAVENOUS
  Filled 2019-01-13: qty 8

## 2019-01-13 MED ORDER — HEPARIN SOD (PORK) LOCK FLUSH 100 UNIT/ML IV SOLN
500.0000 [IU] | Freq: Once | INTRAVENOUS | Status: AC | PRN
  Administered 2019-01-13: 500 [IU]
  Filled 2019-01-13: qty 5

## 2019-01-13 MED ORDER — SODIUM CHLORIDE 0.9 % IV SOLN
Freq: Once | INTRAVENOUS | Status: AC
  Administered 2019-01-13: 12:00:00 via INTRAVENOUS
  Filled 2019-01-13: qty 250

## 2019-01-13 MED ORDER — SODIUM CHLORIDE 0.9% FLUSH
10.0000 mL | Freq: Once | INTRAVENOUS | Status: AC
  Administered 2019-01-13: 10 mL
  Filled 2019-01-13: qty 10

## 2019-01-13 NOTE — Patient Instructions (Signed)
[facility] Discharge Instructions for Patients Receiving Chemotherapy  Today you received the following chemotherapy agent: Keytruda.  To help prevent nausea and vomiting after your treatment, we encourage you to take your nausea medication as directed.   If you develop nausea and vomiting that is not controlled by your nausea medication, call the clinic.   BELOW ARE SYMPTOMS THAT SHOULD BE REPORTED IMMEDIATELY:  *FEVER GREATER THAN 100.5 F  *CHILLS WITH OR WITHOUT FEVER  NAUSEA AND VOMITING THAT IS NOT CONTROLLED WITH YOUR NAUSEA MEDICATION  *UNUSUAL SHORTNESS OF BREATH  *UNUSUAL BRUISING OR BLEEDING  TENDERNESS IN MOUTH AND THROAT WITH OR WITHOUT PRESENCE OF ULCERS  *URINARY PROBLEMS  *BOWEL PROBLEMS  UNUSUAL RASH Items with * indicate a potential emergency and should be followed up as soon as possible.  Feel free to call the clinic should you have any questions or concerns. The clinic phone number is [phone].  Please show the CHEMO ALERT CARD at check-in to the Emergency Department and triage nurse.   

## 2019-01-14 ENCOUNTER — Encounter: Payer: Self-pay | Admitting: Hematology and Oncology

## 2019-01-14 NOTE — Assessment & Plan Note (Signed)
She has intermittent elevated TSH Observe for now If her TSH is elevated again in the future, I will adjust her thyroid medicine

## 2019-01-14 NOTE — Assessment & Plan Note (Signed)
She has mild intermittent flare of hand-foot syndrome I recommend taking a week off Lenvima We have already reduced her treatment recently

## 2019-01-14 NOTE — Assessment & Plan Note (Signed)
Her last imaging study showed near complete resolution of lymphadenopathy in her chest The right neck persistent uptake is stable She will continue a few more months of treatment with reduced dose lenvatinib Plan to repeat imaging study again in September

## 2019-01-14 NOTE — Progress Notes (Signed)
[place] OFFICE PROGRESS NOTE  Patient Care Team: [name], MD as PCP - General (Family Medicine) Reynold Bowen, MD as Consulting Physician (Endocrinology)  ASSESSMENT & PLAN:  Endometrial cancer [facility]) Her last imaging study showed near complete resolution of lymphadenopathy in her chest The right neck persistent uptake is stable She will continue a few more months of treatment with reduced dose lenvatinib Plan to repeat imaging study again in September  Hand foot syndrome She has mild intermittent flare of hand-foot syndrome I recommend taking a week off Lenvima We have already reduced her treatment recently  Essential hypertension She has intermittent proteinuria Her blood pressure at home is satisfactory Observe only for now We may have to adjust the blood pressure medication in the future if her proteinuria is worse  Acquired hypothyroidism She has intermittent elevated TSH Observe for now If her TSH is elevated again in the future, I will adjust her thyroid medicine   Orders Placed This Encounter  Procedures  . NM PET Image Restag (PS) Skull Base To Thigh    Standing Status:   Future    Standing Expiration Date:   01/14/2020    Order Specific Question:   If indicated for the ordered procedure, I authorize the administration of a radiopharmaceutical per Radiology protocol    Answer:   Yes    Order Specific Question:   Preferred imaging location?    Answer:   [facility]    Order Specific Question:   Radiology Contrast Protocol - do NOT remove file path    Answer:   [email or web address]    INTERVAL HISTORY: Please see below for problem oriented charting. She returns for further follow-up She complains of blisters/rash and pain at the bottom of her feet Denies mucositis Her blood pressure at home is satisfactory Denies infusion reaction No recent infection, fever or chills  SUMMARY OF ONCOLOGIC  HISTORY: Oncology History Overview Note  [employer] testing done 11-2015 on surgical path from 2014: MS stable TMB low 4 muts/mb ATM [id number] ERBB3 T389K E2H2 rearrangement exon 9 PPP2R1A P179R TP53 I195N    ER- APPROXIMATELY 25-35% STAINING IN NEOPLASTIC CELLS (INTERMEDIATE)  PR- APPROXIMATELY 25-35% STAINING IN NEOPLASTIC CELLS (STRONG)  Repeat biopsy 04/02/17: ER negative, Her 2 negative  Progressed on Doxil   Endometrial cancer ([place])  06/20/2012 Pathology Results   Biopsy positive for papillary serous carcinoma   06/20/2012 Genetic Testing   Foundation One testing done 11-2015 on surgical path from 2014: MS stable TMB low 4 muts/mb ATM [id number] ERBB3 T389K E2H2 rearrangement exon 9 PPP2R1A P179R TP53 I195N    06/20/2012 Initial Diagnosis   Patient presented to PCP with intermittent vaginal bleeding since ~ Oct 2013, endometrial biopsy 06-20-12 with complex endometrial hyperplasia with atypia   06/26/2012 Imaging   Thickened endometrial lining in a postmenopausal patient experiencing vaginal bleeding. In the setting of post-menopausal bleeding, endometrial sampling is indicated to exclude carcinoma. No focal myometrial abnormalities are seen.  Normal left ovary and non-visualized right ovary   07/30/2012 Surgery   Dr. Polly Cobia performed robotic hysterectomy with bilateral salpingo-oophorectomy, bilateral pelvic lymph node dissection and periaortic lymph node dissection. Intraoperatively on frozen section, the patient was noted to have a large endometrial polyp with changes within the polyp consistent for high-grade malignancy, possibly papillary serous carcinoma. There is no obvious extrauterine disease noted.     07/30/2012 Pathology Results   [phone] SUPPLEMENTAL REPORT  THE ENDOMETRIAL CARCINOMA WAS ANALYZED FOR DNA MISMATCH REPAIR PROTEINS.IMMUNOHISTOCHEMICALLY, THE NEOPLASM  RETAINED NUCLEAR EXPRESSION OF 4 GENE PRODUCTS, MLH1,  MSH2, MSH6, AND PMS2, INVOLVED IN DNA MISMATCH REPAIR.POSITIVE AND NEGATIVE CONTROLS WORKED APPROPRIATELY.  PER REQUEST, AN ER AND PR ARE PERFORMED ON BLOCK 1I.  ER- APPROXIMATELY 25-35% STAINING IN NEOPLASTIC CELLS (INTERMEDIATE)  PR- APPROXIMATELY 25-35% STAINING IN NEOPLASTIC CELLS (STRONG)  ER AND PR PREDOMINANTLY SHOW STAINING IN THE SEROUS COMPONENT.  DIAGNOSIS  1. UTERUS, CERVIX WITH BILATERAL FALLOPIAN TUBES AND OVARIES,  HYSTERECTOMY AND BILATERAL SALPINGO-OOPHORECTOMY:  HIGH GRADE/POORLY DIFFERENTIATED ENDOMETRIAL ADENOCARCINOMA WITH SOLID AND SEROUS PAPILLARY COMPONENTS.  HISTOPATHOLOGIC TYPE:A VARIETY OF PATTERNS WERE PRESENT, ALL OF WHICH SHOULD BE CONSIDERED TO BE HIGH GRADE.SEROUS PAPILLARY CARCINOMA WAS SEEN OCCURRING ADJACENT TO SOLID ENDOMETRIAL CARCINOMA WITH MARKED ANAPLASIA AND GIANT CELLS. SIZE:TUMOR MEASURED AT LEAST 4.8 CM IN GREATEST HORIZONTAL DIMENSION.  GRADE: POORLY DIFFERENTIATED OR HIGH GRADE. DEPTH OF INVASION: NO DEFINITE MYOMETRIAL INVOLVEMENT WAS SEEN.THE TUMOR APPEARED CONFINED TO THE POLYP AS WELL AS SURFACE ENDOMETRIUM. WHERE MYOMETRIAL THICKNESS NOT APPLICABLE. SEROSAL INVOLVEMENT: NOT DEMONSTRATED.  ENDOCERVICAL INVOLVEMENT:NOT DEMONSTRATED. RESECTION MARGINS: FREE OF INVOLVEMENT. EXTRAUTERINE EXTENSION:NOT DEMONSTRATED. ANGIOLYMPHATIC INVASION: NOT DEFINITELY SEEN. TOTAL NODES EXAMINED:22.  PELVIC NODES EXAMINED:20.  PELVIC NODES INVOLVED:0.  PARA-AORTIC NODES 2. EXAMINED:  PARA-AORTIC NODES 0. INVOLVED TNM STAGE: T1A N0 MX. AJCC STAGE GROUPING: IA. FIGO STAGE:IA.   08/26/2012 Imaging   No CT evidence for intra-abdominal or pelvic metastatic disease. Trace free pelvic fluid, presumably postoperative although the date of surgery is not documented in the electronic medical record.   08/26/2012 - 12/13/2012 Chemotherapy   She received 6 cycles of  carbo/taxol    10/29/2012 - 11/27/2012 Radiation Therapy   May 27, June 5,  June 11, June 19, November 27, 2012: Proximal vagina 30 Gy in 5 fractions     01/17/2013 Imaging   1.  No evidence of recurrent or metastatic disease. 2.  No acute abnormality involving the abdomen or pelvis. 3.  Mild diffuse hepatic steatosis. 4.  Very small supraumbilical midline anterior abdominal wall hernia containing fat, unchanged   01/22/2014 Imaging   No evidence for metastatic or recurrent disease. 2. No bowel obstruction.  Normal appendix. 3. Small fat containing hernia, stable in appearance. 4. Status post hysterectomy and bilateral oophorectomy   02/19/2014 Imaging   No pulmonary lesions are identified. The abnormality on the chest x-ray is due to asymmetric left-sided sternoclavicular joint degenerative disease   10/12/2014 Imaging   New mild retroperitoneal lymphadenopathy in the left paraaortic region and proximal left common iliac chain, consistent with metastatic disease. No other sites of metastatic disease identified within the abdomen or pelvis.     11/27/2014 - 02/11/2015 Chemotherapy   She received 4 cycles of carbo/taxol    03/01/2015 PET scan   Single hypermetabolic small retroperitoneal lymph node along the aorta. 2. No evidence of metastatic disease otherwise in the abdomen or pelvis. No evidence local recurrence. 3. Intensely hypermetabolic enlarged nodule adjacent to the RIGHT lobe of thyroid gland. This presumably represents the biopsied lesion in clinician report which was found to be benign thyroid tissue.   04/05/2015 - 05/13/2015 Radiation Therapy   She received 50.4 gray in 28 fractions with simultaneous integrated boost to 56 gray   06/17/2015 Imaging   No acute process or evidence of metastatic disease in the abdomen or pelvis. Resolution of previously described retroperitoneal adenopathy. 2.  Possible constipation. 3. Atherosclerosis.   06/17/2015 Tumor Marker   Patient's tumor was  tested for the following markers: CA125 Results of the tumor marker test  revealed 33   08/12/2015 Tumor Marker   Patient's tumor was tested for the following markers: CA125 Results of the tumor marker test revealed 52   08/31/2015 PET scan   Development of right paratracheal hypermetabolic adenopathy, consistent with nodal metastasis. 2. The previously described isolated abdominal retroperitoneal hypermetabolic node has resolved. 3. Persistent hypermetabolic right thyroid nodule, per report previously biopsied. Correlate with those results. 4.  Possible constipation.   09/09/2015 -  Anti-estrogen oral therapy   She has been receiving alternative treatment between megace and tamoxifen   09/09/2015 Tumor Marker   Patient's tumor was tested for the following markers: CA125 Results of the tumor marker test revealed 37.8   09/20/2015 Procedure   Technically successful ultrasound-guided thyroid aspiration biopsy , dominant right nodule   09/20/2015 Pathology Results   THYROID, RIGHT, FINE NEEDLE ASPIRATION (SPECIMEN 1 OF 1 COLLECTED 09/20/15): FINDINGS CONSISTENT WITH BENIGN THYROID NODULE (BETHESDA CATEGORY II).   10/28/2015 Tumor Marker   Patient's tumor was tested for the following markers: CA125 Results of the tumor marker test revealed 53.2   11/04/2015 Imaging   Stable benign right thyroid nodule   12/16/2015 Tumor Marker   Patient's tumor was tested for the following markers: CA125 Results of the tumor marker test revealed 38.1   03/22/2016 PET scan   Interval progression of hypermetabolic right paratracheal lymph node consistent with metastatic involvement. 2. Stable hypermetabolic right thyroid nodule. Reportedly this has been biopsied in the past.   03/23/2016 Tumor Marker   Patient's tumor was tested for the following markers: CA125 Results of the tumor marker test revealed 62.4   04/13/2016 - 06/01/2016 Radiation Therapy   She received 56 Gy to the chest in 28 fractions     05/09/2016 Imaging   No evidence of left lower extremity deep vein thrombosis. No evidence of a superficial thrombosis of the greater and lesser saphenous veins. Positive for thrombus noted in several varicosities of the calf. No evidence of Baker's cyst on the left.   05/17/2016 Tumor Marker   Patient's tumor was tested for the following markers: CA125 Results of the tumor marker test revealed 9   06/29/2016 Tumor Marker   Patient's tumor was tested for the following markers: CA125 Results of the tumor marker test revealed 5.9   08/04/2016 Tumor Marker   Patient's tumor was tested for the following markers: CA125 Results of the tumor marker test revealed 6.0   09/12/2016 PET scan   Complete metabolic response to therapy, with resolution of hypermetabolic mediastinal lymphadenopathy since prior exam. No residual or new metastatic disease identified. Stable hypermetabolic right thyroid lobe nodule, which was previously biopsied on 09/20/2015.   03/13/2017 PET scan   1. Solitary focus of recurrent right paratracheal hypermetabolic activity, with a 1.0 cm right lower paratracheal node having a maximum SUV of 9.0. Appearance compatible with recurrent malignancy. 2. Continued hypermetabolic right thyroid nodule, previously biopsied, presumed benign -correlate with prior biopsy results. 3. Other imaging findings of potential clinical significance: Aortic Atherosclerosis (ICD10-I70.0). Mild cardiomegaly. Prominent stool throughout the colon favors constipation.   04/02/2017 Pathology Results   FINE NEEDLE ASPIRATION, ENDOSCOPIC, (EBUS) 4 R NODE (SPECIMEN 1 OF 2 COLLECTED 04/02/17): MALIGNANT CELLS PRESENT, CONSISTENT WITH CARCINOMA. SEE COMMENT. COMMENT: THE MALIGNANT CELLS ARE POSITIVE FOR P53 AND NEGATIVE FOR ESTROGEN RECEPTOR AND TTF-1. THIS PROFILE IS NON-SPECIFIC, BUT THE P53 POSITIVE STAINING IS SUGGESTIVE OF GYNECOLOGIC PRIMARY.    04/11/2017 Procedure   Successful 8 French right internal  jugular vein power port placement  with its tip at the SVC/RA junction   04/12/2017 Imaging   Normal LV size with mild LV hypertrophy. EF 55-60%. Normal RV size and systolic function. Aortic valve sclerosis without significant stenosis.   04/18/2017 - 06/14/2017 Chemotherapy   The patient had chemotherapy with Doxil    07/10/2017 Imaging   - Left ventricle: The cavity size was normal. There was mild concentric hypertrophy. Systolic function was normal. The estimated ejection fraction was in the range of 55% to 60%. Wall motion was normal; there were no regional wall motion abnormalities. Doppler parameters are consistent with abnormal left ventricular relaxation (grade 1 diastolic dysfunction). - Aortic valve: Noncoronary cusp mobility was mildly restricted. - Mitral valve: There was mild regurgitation. - Left atrium: The atrium was mildly dilated. - Atrial septum: No defect or patent foramen ovale was identified.  Impressions:  - Compared to November 2018, global LV longitudinal strain remains normal (has increased)   07/11/2017 PET scan   Increased size and hypermetabolic activity of 3.3 cm right thyroid lobe nodule. Thyroid carcinoma cannot be excluded. Recommend repeat ultrasound guided fine needle aspiration to exclude thyroid carcinoma.  New adjacent hypermetabolic 10 mm right supraclavicular lymph node, suspicious for lymph node metastasis.  Slight increase in size and hypermetabolic activity of solitary right paratracheal lymph node.  No evidence of abdominal or pelvic metastatic disease.   07/18/2017 Procedure   1. Technically successful ultrasound guided fine needle aspiration of indeterminate hypermetabolic right-sided thyroid nodule/mass. 2. Technically successful ultrasound-guided core needle biopsy of hypermetabolic right lower cervical lymph node.   07/18/2017 Pathology Results   THYROID, FINE NEEDLE ASPIRATION RIGHT (SPECIMEN 1 OF 1, COLLECTED ON 07/18/2017): ATYPIA OF  UNDETERMINED SIGNIFICANCE OR FOLLICULAR LESION OF UNDETERMINED SIGNIFICANCE (BETHESDA CATEGORY III). SEE COMMENT. COMMENT: THE SPECIMEN CONSISTS OF SMALL AND MEDIUM SIZED GROUPS OF FOLLICULAR EPITHELIAL CELLS WITH MILD CYTOLOGIC ATYPIA INCLUDING NUCLEAR ENLARGEMENT AND HURTHLE CELL CHANGE. SOME GROUPS ARE ARRANGED AS MICROFOLLICLES. THERE IS MINIMAL BACKGROUND COLLOID. BASED ON THESE FEATURES, A FOLLICULAR LESION/NEOPLASM CAN NOT BE ENTIRELY RULED OUT. A SPECIMEN WILL BE SENT FOR AFIRMA TESTING.   07/19/2017 Pathology Results   Lymph node, needle/core biopsy - METASTATIC PAPILLARY SEROUS CARCINOMA. - SEE COMMENT. Microscopic Comment Dr. Vicente Males has reviewed the case and concurs with this interpretation   10/15/2017 PET scan   No new or progressive disease. No evidence of abdominal or pelvic metastatic disease.  Stable hypermetabolic right thyroid lobe nodule and adjacent right supraclavicular lymph node.  Decreased size and hypermetabolic activity of solitary right paratracheal lymph node.   01/23/2018 PET scan   1. Overall, no significant change from PET-CT of 3 months ago. There is persistent hypermetabolic activity at the right thoracic inlet and within a right paratracheal mediastinal node. The nodes have not significantly changed in size, although the metabolic activity has minimally increased over this interval. It is uncertain how much of the thoracic inlet metabolic activity is attributed to the thyroid nodule versus adjacent cervical lymph nodes, both previously biopsied. 2. No new hypermetabolic activity within the neck, chest, abdomen or pelvis. No evidence of local recurrence in the pelvis. 3. Stable probable radiation changes in the right lung.   04/16/2018 PET scan   1. Interval increase in metabolic activity of the RIGHT supraclavicular node and RIGHT lower paratracheal mediastinal node with minimal change in size. Findings consistent with persistent and mildly progressed  metastatic adenopathy. 2. New hypermetabolic small lymph node in the RIGHT lower paratracheal nodal station adjacent to the previously followed  node. 3. No evidence of local recurrence in the pelvis or new disease elsewhere.   07/30/2018 PET scan   1. Mild interval progression of right supraclavicular, mediastinal, and right hilar hypermetabolic metastatic disease. There is a new hypermetabolic lymph node in the subcarinal station on today's study. 2. No hypermetabolic disease in the abdomen or pelvis to suggest recurrent/metastases. 3.  Aortic Atherosclerois (ICD10-170.0)    08/09/2018 -  Chemotherapy   The patient had Lenvima since 3/6 and pembrolizumab since 3/13 for chemotherapy treatment. Michel Santee was held temporarily due to infection and severe hypertension   10/31/2018 PET scan   Partial response to therapy, with decreased hypermetabolic lymphadenopathy in mediastinum and right hilar region.  No significant change in hypermetabolic lymphadenopathy in right inferior neck.  No new or progressive metastatic disease identified. 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      REVIEW OF SYSTEMS:   Constitutional: Denies fevers, chills or abnormal weight loss Eyes: Denies blurriness of vision Ears, nose, mouth, throat, and face: Denies mucositis or sore throat Respiratory: Denies cough, dyspnea or wheezes Cardiovascular: Denies palpitation, chest discomfort or lower  extremity swelling Gastrointestinal:  Denies nausea, heartburn or change in bowel habits Lymphatics: Denies new lymphadenopathy or easy bruising Neurological:Denies numbness, tingling or new weaknesses Behavioral/Psych: Mood is stable, no new changes  All other systems were reviewed with the patient and are negative.  I have reviewed the past medical history, past surgical history, social history and family history with the patient and they are unchanged from previous note.  ALLERGIES:  has No Known Allergies.  MEDICATIONS:  Current Outpatient Medications  Medication Sig Dispense Refill  . amLODipine (NORVASC) 10 MG tablet TAKE 1 TABLET BY MOUTH DAILY. DECREASE SIMVASTATIN TO 20MG DAILY WHILE ON AMLODIPINE PER MD 90 tablet 1  . Calcium Carb-Cholecalciferol 500-600 MG-UNIT TABS Take 1 tablet by mouth daily.     . Cholecalciferol (VITAMIN D3) 2000 units TABS Take 2,000 Units by mouth daily.     . hydrochlorothiazide (HYDRODIURIL) 50 MG tablet Take 1 tablet (50 mg total) by mouth daily. 90 tablet 11  . lenvatinib 10 mg daily dose (LENVIMA, 10 MG DAILY DOSE,) capsule Take 1 capsule (10 mg total) by mouth daily. 30 capsule 11  . levothyroxine (SYNTHROID) 50 MCG tablet TAKE 1 TABLET (50 MCG TOTAL) BY MOUTH DAILY BEFORE BREAKFAST. 90 tablet 3  . LORazepam (ATIVAN) 0.5 MG tablet Take 0.5 mg by mouth 2 (two) times daily as needed.  0  . losartan (COZAAR) 50 MG tablet Take 1 tablet (50 mg total) by mouth daily. 90 tablet 11  . metoprolol tartrate (LOPRESSOR) 25 MG tablet Take 1 tablet (25 mg total) by mouth 2 (two) times daily. 180 tablet 3  . simvastatin (ZOCOR) 40 MG tablet Take 40 mg by mouth every evening.    . tacrolimus (PROTOPIC) 0.1 % ointment Apply 1 application topically daily as needed (rash).     . venlafaxine XR (EFFEXOR-XR) 75 MG 24 hr capsule Take 75 mg  by mouth daily with breakfast.      No current facility-administered medications for this visit.     PHYSICAL EXAMINATION: ECOG  PERFORMANCE STATUS: 1 - Symptomatic but completely ambulatory  Vitals:   01/13/19 1141  BP: 125/72  Pulse: 71  Resp: 18  Temp: 98.5 F (36.9 C)  SpO2: 99%   Filed Weights   01/13/19 1141  Weight: 160 lb 6.4 oz (72.8 kg)    GENERAL:alert, no distress and comfortable SKIN: Noted skin discoloration and changes consistent with hand-foot syndrome affecting her feet EYES: normal, Conjunctiva are pink and non-injected, sclera clear OROPHARYNX:no exudate, no erythema and lips, buccal mucosa, and tongue normal  NECK: supple, thyroid normal size, non-tender, without nodularity LYMPH:  no palpable lymphadenopathy in the cervical, axillary or inguinal LUNGS: clear to auscultation and percussion with normal breathing effort HEART: regular rate & rhythm and no murmurs and no lower extremity edema ABDOMEN:abdomen soft, non-tender and normal bowel sounds Musculoskeletal:no cyanosis of digits and no clubbing  NEURO: alert & oriented x 3 with fluent speech, no focal motor/sensory deficits  LABORATORY DATA:  I have reviewed the data as listed    Component Value Date/Time   NA 139 01/13/2019 1117   NA 141 05/17/2017 0957   K 4.3 01/13/2019 1117   K 3.6 05/17/2017 0957   CL 102 01/13/2019 1117   CL 105 11/19/2012 0848   CO2 24 01/13/2019 1117   CO2 25 05/17/2017 0957   GLUCOSE 95 01/13/2019 1117   GLUCOSE 85 05/17/2017 0957   GLUCOSE 122 (H) 11/19/2012 0848   BUN 23 01/13/2019 1117   BUN 15.2 05/17/2017 0957   CREATININE 0.92 01/13/2019 1117   CREATININE 0.8 05/17/2017 0957   CALCIUM 9.4 01/13/2019 1117   CALCIUM 9.6 05/17/2017 0957   PROT 7.2 01/13/2019 1117   PROT 7.0 05/17/2017 0957   ALBUMIN 3.6 01/13/2019 1117   ALBUMIN 3.9 05/17/2017 0957   AST 25 01/13/2019 1117   AST 25 05/17/2017 0957   ALT 22 01/13/2019 1117   ALT 18 05/17/2017 0957   ALKPHOS 57 01/13/2019 1117   ALKPHOS 57 05/17/2017 0957   BILITOT 0.4 01/13/2019 1117   BILITOT 0.47 05/17/2017 0957   GFRNONAA >60  01/13/2019 1117   GFRAA >60 01/13/2019 1117    No results found for: SPEP, UPEP  Lab Results  Component Value Date   WBC 4.4 01/13/2019   NEUTROABS 3.0 01/13/2019   HGB 11.2 (L) 01/13/2019   HCT 33.5 (L) 01/13/2019   MCV 97.1 01/13/2019   PLT 199 01/13/2019      Chemistry      Component Value Date/Time   NA 139 01/13/2019 1117   NA 141 05/17/2017 0957   K 4.3 01/13/2019 1117   K 3.6 05/17/2017 0957   CL 102 01/13/2019 1117   CL 105 11/19/2012 0848   CO2 24 01/13/2019 1117   CO2 25 05/17/2017 0957   BUN 23 01/13/2019 1117   BUN 15.2 05/17/2017 0957   CREATININE 0.92 01/13/2019 1117   CREATININE 0.8 05/17/2017 0957   GLU 158 (H) 01/14/2015 1035      Component Value Date/Time   CALCIUM 9.4 01/13/2019 1117   CALCIUM 9.6 05/17/2017 0957   ALKPHOS 57 01/13/2019 1117   ALKPHOS 57 05/17/2017 0957   AST 25 01/13/2019 1117   AST 25 05/17/2017 0957   ALT 22 01/13/2019 1117   ALT 18 05/17/2017 0957   BILITOT 0.4 01/13/2019 1117   BILITOT 0.47 05/17/2017 0957  All questions were answered. The patient knows to call the clinic with any problems, questions or concerns. No barriers to learning was detected.  I spent 25 minutes counseling the patient face to face. The total time spent in the appointment was 30 minutes and more than 50% was on counseling and review of test results  [name], MD 01/14/2019 4:58 PM

## 2019-01-14 NOTE — Assessment & Plan Note (Signed)
She has intermittent proteinuria Her blood pressure at home is satisfactory Observe only for now We may have to adjust the blood pressure medication in the future if her proteinuria is worse

## 2019-01-20 ENCOUNTER — Other Ambulatory Visit: Payer: Self-pay | Admitting: Hematology and Oncology

## 2019-01-20 DIAGNOSIS — E041 Nontoxic single thyroid nodule: Secondary | ICD-10-CM | POA: Diagnosis not present

## 2019-01-20 DIAGNOSIS — D649 Anemia, unspecified: Secondary | ICD-10-CM | POA: Diagnosis not present

## 2019-01-20 DIAGNOSIS — I10 Essential (primary) hypertension: Secondary | ICD-10-CM | POA: Diagnosis not present

## 2019-01-20 DIAGNOSIS — I5189 Other ill-defined heart diseases: Secondary | ICD-10-CM | POA: Diagnosis not present

## 2019-01-20 DIAGNOSIS — C541 Malignant neoplasm of endometrium: Secondary | ICD-10-CM | POA: Diagnosis not present

## 2019-01-20 DIAGNOSIS — D72819 Decreased white blood cell count, unspecified: Secondary | ICD-10-CM | POA: Diagnosis not present

## 2019-01-20 DIAGNOSIS — E785 Hyperlipidemia, unspecified: Secondary | ICD-10-CM | POA: Diagnosis not present

## 2019-01-20 DIAGNOSIS — I700 Atherosclerosis of aorta: Secondary | ICD-10-CM | POA: Diagnosis not present

## 2019-01-20 DIAGNOSIS — M8580 Other specified disorders of bone density and structure, unspecified site: Secondary | ICD-10-CM | POA: Diagnosis not present

## 2019-01-21 MED FILL — HYDROCHLOROTHIAZIDE 25 MG T: 25 | 90 days supply | Qty: 90 | Fill #0

## 2019-01-22 MED FILL — LOSARTAN POTASSIUM 50 MG TA: 50 | 30 days supply | Qty: 30 | Fill #4

## 2019-02-03 ENCOUNTER — Ambulatory Visit: Payer: 59

## 2019-02-03 ENCOUNTER — Other Ambulatory Visit: Payer: 59

## 2019-02-03 ENCOUNTER — Ambulatory Visit: Payer: 59 | Admitting: Hematology and Oncology

## 2019-02-04 ENCOUNTER — Other Ambulatory Visit: Payer: Self-pay

## 2019-02-04 ENCOUNTER — Ambulatory Visit: Payer: 59

## 2019-02-04 ENCOUNTER — Inpatient Hospital Stay: Payer: 59 | Attending: Hematology and Oncology

## 2019-02-04 ENCOUNTER — Ambulatory Visit: Payer: 59 | Admitting: Hematology and Oncology

## 2019-02-04 ENCOUNTER — Inpatient Hospital Stay: Payer: 59

## 2019-02-04 ENCOUNTER — Ambulatory Visit (HOSPITAL_COMMUNITY)
Admission: RE | Admit: 2019-02-04 | Discharge: 2019-02-04 | Disposition: A | Discharge status: Home / Self Care | Payer: 59 | Source: Ambulatory Visit | Attending: Hematology and Oncology | Admitting: Hematology and Oncology

## 2019-02-04 DIAGNOSIS — Z9221 Personal history of antineoplastic chemotherapy: Secondary | ICD-10-CM | POA: Insufficient documentation

## 2019-02-04 DIAGNOSIS — C541 Malignant neoplasm of endometrium: Secondary | ICD-10-CM | POA: Insufficient documentation

## 2019-02-04 DIAGNOSIS — C770 Secondary and unspecified malignant neoplasm of lymph nodes of head, face and neck: Secondary | ICD-10-CM

## 2019-02-04 DIAGNOSIS — R946 Abnormal results of thyroid function studies: Secondary | ICD-10-CM | POA: Insufficient documentation

## 2019-02-04 DIAGNOSIS — Z5112 Encounter for antineoplastic immunotherapy: Secondary | ICD-10-CM | POA: Insufficient documentation

## 2019-02-04 DIAGNOSIS — E041 Nontoxic single thyroid nodule: Secondary | ICD-10-CM | POA: Insufficient documentation

## 2019-02-04 DIAGNOSIS — Z23 Encounter for immunization: Secondary | ICD-10-CM | POA: Insufficient documentation

## 2019-02-04 DIAGNOSIS — E039 Hypothyroidism, unspecified: Secondary | ICD-10-CM | POA: Insufficient documentation

## 2019-02-04 DIAGNOSIS — Z79899 Other long term (current) drug therapy: Secondary | ICD-10-CM | POA: Insufficient documentation

## 2019-02-04 DIAGNOSIS — Z95828 Presence of other vascular implants and grafts: Secondary | ICD-10-CM

## 2019-02-04 DIAGNOSIS — Z923 Personal history of irradiation: Secondary | ICD-10-CM | POA: Insufficient documentation

## 2019-02-04 DIAGNOSIS — R809 Proteinuria, unspecified: Secondary | ICD-10-CM | POA: Insufficient documentation

## 2019-02-04 DIAGNOSIS — Z7689 Persons encountering health services in other specified circumstances: Secondary | ICD-10-CM | POA: Insufficient documentation

## 2019-02-04 LAB — CBC WITH DIFFERENTIAL/PLATELET
Abs Immature Granulocytes: 0.01 10*3/uL (ref 0.00–0.07)
Basophils Absolute: 0.1 10*3/uL (ref 0.0–0.1)
Basophils Relative: 1 %
Eosinophils Absolute: 0.3 10*3/uL (ref 0.0–0.5)
Eosinophils Relative: 7 %
HCT: 32.5 % — ABNORMAL LOW (ref 36.0–46.0)
Hemoglobin: 10.9 g/dL — ABNORMAL LOW (ref 12.0–15.0)
Immature Granulocytes: 0 %
Lymphocytes Relative: 18 %
Lymphs Abs: 0.7 10*3/uL (ref 0.7–4.0)
MCH: 32.2 pg (ref 26.0–34.0)
MCHC: 33.5 g/dL (ref 30.0–36.0)
MCV: 95.9 fL (ref 80.0–100.0)
Monocytes Absolute: 0.3 10*3/uL (ref 0.1–1.0)
Monocytes Relative: 7 %
Neutro Abs: 2.7 10*3/uL (ref 1.7–7.7)
Neutrophils Relative %: 67 %
Platelets: 178 10*3/uL (ref 150–400)
RBC: 3.39 MIL/uL — ABNORMAL LOW (ref 3.87–5.11)
RDW: 12.8 % (ref 11.5–15.5)
WBC: 4 10*3/uL (ref 4.0–10.5)
nRBC: 0 % (ref 0.0–0.2)

## 2019-02-04 LAB — GLUCOSE, CAPILLARY: Glucose-Capillary: 94 mg/dL (ref 70–99)

## 2019-02-04 LAB — TSH: TSH: 3.02 u[IU]/mL (ref 0.308–3.960)

## 2019-02-04 LAB — TOTAL PROTEIN, URINE DIPSTICK: Protein, ur: 100 mg/dL — AB

## 2019-02-04 MED ORDER — SODIUM CHLORIDE 0.9% FLUSH
10.0000 mL | Freq: Once | INTRAVENOUS | Status: AC
  Administered 2019-02-04: 10 mL
  Filled 2019-02-04: qty 10

## 2019-02-04 MED ORDER — FLUDEOXYGLUCOSE F - 18 (FDG) INJECTION
7.9300 | Freq: Once | INTRAVENOUS | Status: AC | PRN
  Administered 2019-02-04: 7.93 via INTRAVENOUS

## 2019-02-04 NOTE — Patient Instructions (Signed)

## 2019-02-05 ENCOUNTER — Inpatient Hospital Stay: Payer: 59

## 2019-02-05 ENCOUNTER — Other Ambulatory Visit: Payer: Self-pay

## 2019-02-05 ENCOUNTER — Inpatient Hospital Stay (HOSPITAL_BASED_OUTPATIENT_CLINIC_OR_DEPARTMENT_OTHER): Payer: 59 | Admitting: Hematology and Oncology

## 2019-02-05 ENCOUNTER — Other Ambulatory Visit: Payer: Self-pay | Admitting: Hematology and Oncology

## 2019-02-05 DIAGNOSIS — Z5112 Encounter for antineoplastic immunotherapy: Secondary | ICD-10-CM | POA: Diagnosis not present

## 2019-02-05 DIAGNOSIS — E041 Nontoxic single thyroid nodule: Secondary | ICD-10-CM | POA: Diagnosis not present

## 2019-02-05 DIAGNOSIS — Z7689 Persons encountering health services in other specified circumstances: Secondary | ICD-10-CM | POA: Diagnosis not present

## 2019-02-05 DIAGNOSIS — I10 Essential (primary) hypertension: Secondary | ICD-10-CM | POA: Diagnosis not present

## 2019-02-05 DIAGNOSIS — R946 Abnormal results of thyroid function studies: Secondary | ICD-10-CM | POA: Diagnosis not present

## 2019-02-05 DIAGNOSIS — E039 Hypothyroidism, unspecified: Secondary | ICD-10-CM | POA: Diagnosis not present

## 2019-02-05 DIAGNOSIS — Z7189 Other specified counseling: Secondary | ICD-10-CM

## 2019-02-05 DIAGNOSIS — C541 Malignant neoplasm of endometrium: Secondary | ICD-10-CM

## 2019-02-05 DIAGNOSIS — Z23 Encounter for immunization: Secondary | ICD-10-CM | POA: Diagnosis not present

## 2019-02-05 DIAGNOSIS — R809 Proteinuria, unspecified: Secondary | ICD-10-CM | POA: Diagnosis not present

## 2019-02-05 DIAGNOSIS — C770 Secondary and unspecified malignant neoplasm of lymph nodes of head, face and neck: Secondary | ICD-10-CM

## 2019-02-05 DIAGNOSIS — Z79899 Other long term (current) drug therapy: Secondary | ICD-10-CM | POA: Diagnosis not present

## 2019-02-05 DIAGNOSIS — Z923 Personal history of irradiation: Secondary | ICD-10-CM | POA: Diagnosis not present

## 2019-02-05 DIAGNOSIS — Z9221 Personal history of antineoplastic chemotherapy: Secondary | ICD-10-CM | POA: Diagnosis not present

## 2019-02-05 LAB — COMPREHENSIVE METABOLIC PANEL
ALT: 21 U/L (ref 0–44)
AST: 24 U/L (ref 15–41)
Albumin: 3.7 g/dL (ref 3.5–5.0)
Alkaline Phosphatase: 69 U/L (ref 38–126)
Anion gap: 10 (ref 5–15)
BUN: 20 mg/dL (ref 8–23)
CO2: 28 mmol/L (ref 22–32)
Calcium: 9.4 mg/dL (ref 8.9–10.3)
Chloride: 101 mmol/L (ref 98–111)
Creatinine, Ser: 0.81 mg/dL (ref 0.44–1.00)
GFR calc Af Amer: >60 mL/min (ref >60)
GFR calc non Af Amer: >60 mL/min (ref >60)
Glucose, Bld: 98 mg/dL (ref 70–99)
Potassium: 3.9 mmol/L (ref 3.5–5.1)
Sodium: 139 mmol/L (ref 135–145)
Total Bilirubin: 0.3 mg/dL (ref 0.3–1.2)
Total Protein: 7.1 g/dL (ref 6.5–8.1)

## 2019-02-05 MED ORDER — SODIUM CHLORIDE 0.9 % IV SOLN
200.0000 mg | Freq: Once | INTRAVENOUS | Status: AC
  Administered 2019-02-05: 200 mg via INTRAVENOUS
  Filled 2019-02-05: qty 8

## 2019-02-05 MED ORDER — SODIUM CHLORIDE 0.9% FLUSH
10.0000 mL | INTRAVENOUS | Status: DC | PRN
  Administered 2019-02-05: 10 mL
  Filled 2019-02-05: qty 10

## 2019-02-05 MED ORDER — HEPARIN SOD (PORK) LOCK FLUSH 100 UNIT/ML IV SOLN
500.0000 [IU] | Freq: Once | INTRAVENOUS | Status: AC | PRN
  Administered 2019-02-05: 500 [IU]
  Filled 2019-02-05: qty 5

## 2019-02-05 MED ORDER — SODIUM CHLORIDE 0.9 % IV SOLN
Freq: Once | INTRAVENOUS | Status: AC
  Administered 2019-02-05: 10:00:00 via INTRAVENOUS
  Filled 2019-02-05: qty 250

## 2019-02-05 NOTE — Progress Notes (Signed)
Ok to proceed with treatment using CMP from 01/13/19 per Dr. Alvy Bimler.

## 2019-02-05 NOTE — Patient Instructions (Signed)
[facility] Discharge Instructions for Patients Receiving Chemotherapy  Today you received the following chemotherapy agent: Keytruda.  To help prevent nausea and vomiting after your treatment, we encourage you to take your nausea medication as directed.   If you develop nausea and vomiting that is not controlled by your nausea medication, call the clinic.   BELOW ARE SYMPTOMS THAT SHOULD BE REPORTED IMMEDIATELY:  *FEVER GREATER THAN 100.5 F  *CHILLS WITH OR WITHOUT FEVER  NAUSEA AND VOMITING THAT IS NOT CONTROLLED WITH YOUR NAUSEA MEDICATION  *UNUSUAL SHORTNESS OF BREATH  *UNUSUAL BRUISING OR BLEEDING  TENDERNESS IN MOUTH AND THROAT WITH OR WITHOUT PRESENCE OF ULCERS  *URINARY PROBLEMS  *BOWEL PROBLEMS  UNUSUAL RASH Items with * indicate a potential emergency and should be followed up as soon as possible.  Feel free to call the clinic should you have any questions or concerns. The clinic phone number is [phone].  Please show the CHEMO ALERT CARD at check-in to the Emergency Department and triage nurse.   

## 2019-02-06 ENCOUNTER — Encounter: Payer: Self-pay | Admitting: Hematology and Oncology

## 2019-02-06 NOTE — Assessment & Plan Note (Signed)
She has intermittent proteinuria even though her blood pressure remains well controlled I have adjusted her blood pressure medication again She will hold Lenvima for 1 week and resume next week

## 2019-02-06 NOTE — Assessment & Plan Note (Signed)
The thyroid nodule has been biopsied before We will observe only for now even though PET CT scan show activity

## 2019-02-06 NOTE — Assessment & Plan Note (Signed)
She has intermittent elevated TSH Observe for now If her TSH is elevated again in the future, I will adjust her thyroid medicine 

## 2019-02-06 NOTE — Progress Notes (Signed)
[place] OFFICE PROGRESS NOTE  Patient Care Team: [name], MD as PCP - General (Family Medicine) Reynold Bowen, MD as Consulting Physician (Endocrinology)  ASSESSMENT & PLAN:  Endometrial cancer [facility]) I have reviewed multiple PET scan with the patient The patient has positive response to treatment She has intermittent proteinuria secondary to [facility], of which the dose has already been reduced We will proceed with pembrolizumab but will hold Lenvima for 1 week before resumption The plan would be to continue treatment indefinitely I do not plan to repeat another imaging study for another 6 months, due next around March 2021  Acquired hypothyroidism She has intermittent elevated TSH Observe for now If her TSH is elevated again in the future, I will adjust her thyroid medicine  Essential hypertension She has intermittent proteinuria even though her blood pressure remains well controlled I have adjusted her blood pressure medication again She will hold Lenvima for 1 week and resume next week  Right thyroid nodule The thyroid nodule has been biopsied before We will observe only for now even though PET CT scan show activity   No orders of the defined types were placed in this encounter.   INTERVAL HISTORY: Please see below for problem oriented charting. She returns for treatment and review of PET scan She tolerated treatment well Denies headache or changes in vision or severe uncontrolled hypertension at home No recent hand-foot syndrome No recent infection, fever or chills No infusion reactions  SUMMARY OF ONCOLOGIC HISTORY: Oncology History Overview Note  [employer] testing done 11-2015 on surgical path from 2014: MS stable TMB low 4 muts/mb ATM [id number] ERBB3 T389K E2H2 rearrangement exon 9 PPP2R1A P179R TP53 I195N    ER- APPROXIMATELY 25-35% STAINING IN NEOPLASTIC CELLS (INTERMEDIATE)  PR- APPROXIMATELY 25-35% STAINING IN  NEOPLASTIC CELLS (STRONG)  Repeat biopsy 04/02/17: ER negative, Her 2 negative  Progressed on Doxil   Endometrial cancer ([place])  06/20/2012 Pathology Results   Biopsy positive for papillary serous carcinoma   06/20/2012 Genetic Testing   [employer] testing done 11-2015 on surgical path from 2014: MS stable TMB low 4 muts/mb ATM [id number] ERBB3 T389K E2H2 rearrangement exon 9 PPP2R1A P179R TP53 I195N    06/20/2012 Initial Diagnosis   Patient presented to PCP with intermittent vaginal bleeding since ~ Oct 2013, endometrial biopsy 06-20-12 with complex endometrial hyperplasia with atypia   06/26/2012 Imaging   Thickened endometrial lining in a postmenopausal patient experiencing vaginal bleeding. In the setting of post-menopausal bleeding, endometrial sampling is indicated to exclude carcinoma. No focal myometrial abnormalities are seen.  Normal left ovary and non-visualized right ovary   07/30/2012 Surgery   Dr. Polly Cobia performed robotic hysterectomy with bilateral salpingo-oophorectomy, bilateral pelvic lymph node dissection and periaortic lymph node dissection. Intraoperatively on frozen section, the patient was noted to have a large endometrial polyp with changes within the polyp consistent for high-grade malignancy, possibly papillary serous carcinoma. There is no obvious extrauterine disease noted.     07/30/2012 Pathology Results   [phone] SUPPLEMENTAL REPORT  THE ENDOMETRIAL CARCINOMA WAS ANALYZED FOR DNA MISMATCH REPAIR PROTEINS.IMMUNOHISTOCHEMICALLY, THE NEOPLASM RETAINED NUCLEAR EXPRESSION OF 4 GENE PRODUCTS, MLH1, MSH2, MSH6, AND PMS2, INVOLVED IN DNA MISMATCH REPAIR.POSITIVE AND NEGATIVE CONTROLS WORKED APPROPRIATELY.  PER REQUEST, AN ER AND PR ARE PERFORMED ON BLOCK 1I.  ER- APPROXIMATELY 25-35% STAINING IN NEOPLASTIC CELLS (INTERMEDIATE)  PR- APPROXIMATELY 25-35% STAINING IN NEOPLASTIC CELLS (STRONG)  ER AND PR PREDOMINANTLY SHOW STAINING IN THE  SEROUS COMPONENT.  DIAGNOSIS  1. UTERUS,  CERVIX WITH BILATERAL FALLOPIAN TUBES AND OVARIES,  HYSTERECTOMY AND BILATERAL SALPINGO-OOPHORECTOMY:  HIGH GRADE/POORLY DIFFERENTIATED ENDOMETRIAL ADENOCARCINOMA WITH SOLID AND SEROUS PAPILLARY COMPONENTS.  HISTOPATHOLOGIC TYPE:A VARIETY OF PATTERNS WERE PRESENT, ALL OF WHICH SHOULD BE CONSIDERED TO BE HIGH GRADE.SEROUS PAPILLARY CARCINOMA WAS SEEN OCCURRING ADJACENT TO SOLID ENDOMETRIAL CARCINOMA WITH MARKED ANAPLASIA AND GIANT CELLS. SIZE:TUMOR MEASURED AT LEAST 4.8 CM IN GREATEST HORIZONTAL DIMENSION.  GRADE: POORLY DIFFERENTIATED OR HIGH GRADE. DEPTH OF INVASION: NO DEFINITE MYOMETRIAL INVOLVEMENT WAS SEEN.THE TUMOR APPEARED CONFINED TO THE POLYP AS WELL AS SURFACE ENDOMETRIUM. WHERE MYOMETRIAL THICKNESS NOT APPLICABLE. SEROSAL INVOLVEMENT: NOT DEMONSTRATED.  ENDOCERVICAL INVOLVEMENT:NOT DEMONSTRATED. RESECTION MARGINS: FREE OF INVOLVEMENT. EXTRAUTERINE EXTENSION:NOT DEMONSTRATED. ANGIOLYMPHATIC INVASION: NOT DEFINITELY SEEN. TOTAL NODES EXAMINED:22.  PELVIC NODES EXAMINED:20.  PELVIC NODES INVOLVED:0.  PARA-AORTIC NODES 2. EXAMINED:  PARA-AORTIC NODES 0. INVOLVED TNM STAGE: T1A N0 MX. AJCC STAGE GROUPING: IA. FIGO STAGE:IA.   08/26/2012 Imaging   No CT evidence for intra-abdominal or pelvic metastatic disease. Trace free pelvic fluid, presumably postoperative although the date of surgery is not documented in the electronic medical record.   08/26/2012 - 12/13/2012 Chemotherapy   She received 6 cycles of carbo/taxol    10/29/2012 - 11/27/2012 Radiation Therapy   May 27, June 5,  June 11, June 19, November 27, 2012: Proximal vagina 30 Gy in 5 fractions     01/17/2013 Imaging   1.  No evidence of recurrent or metastatic disease. 2.  No acute abnormality involving the abdomen or pelvis. 3.  Mild diffuse hepatic steatosis. 4.  Very small  supraumbilical midline anterior abdominal wall hernia containing fat, unchanged   01/22/2014 Imaging   No evidence for metastatic or recurrent disease. 2. No bowel obstruction.  Normal appendix. 3. Small fat containing hernia, stable in appearance. 4. Status post hysterectomy and bilateral oophorectomy   02/19/2014 Imaging   No pulmonary lesions are identified. The abnormality on the chest x-ray is due to asymmetric left-sided sternoclavicular joint degenerative disease   10/12/2014 Imaging   New mild retroperitoneal lymphadenopathy in the left paraaortic region and proximal left common iliac chain, consistent with metastatic disease. No other sites of metastatic disease identified within the abdomen or pelvis.     11/27/2014 - 02/11/2015 Chemotherapy   She received 4 cycles of carbo/taxol    03/01/2015 PET scan   Single hypermetabolic small retroperitoneal lymph node along the aorta. 2. No evidence of metastatic disease otherwise in the abdomen or pelvis. No evidence local recurrence. 3. Intensely hypermetabolic enlarged nodule adjacent to the RIGHT lobe of thyroid gland. This presumably represents the biopsied lesion in clinician report which was found to be benign thyroid tissue.   04/05/2015 - 05/13/2015 Radiation Therapy   She received 50.4 gray in 28 fractions with simultaneous integrated boost to 56 gray   06/17/2015 Imaging   No acute process or evidence of metastatic disease in the abdomen or pelvis. Resolution of previously described retroperitoneal adenopathy. 2.  Possible constipation. 3. Atherosclerosis.   06/17/2015 Tumor Marker   Patient's tumor was tested for the following markers: CA125 Results of the tumor marker test revealed 33   08/12/2015 Tumor Marker   Patient's tumor was tested for the following markers: CA125 Results of the tumor marker test revealed 52   08/31/2015 PET scan   Development of right paratracheal hypermetabolic adenopathy, consistent with nodal  metastasis. 2. The previously described isolated abdominal retroperitoneal hypermetabolic node has resolved. 3. Persistent hypermetabolic right thyroid nodule, per report previously biopsied. Correlate with those results. 4.  Possible constipation.   09/09/2015 -  Anti-estrogen oral therapy   She has been receiving alternative treatment between megace and tamoxifen   09/09/2015 Tumor Marker   Patient's tumor was tested for the following markers: CA125 Results of the tumor marker test revealed 37.8   09/20/2015 Procedure   Technically successful ultrasound-guided thyroid aspiration biopsy , dominant right nodule   09/20/2015 Pathology Results   THYROID, RIGHT, FINE NEEDLE ASPIRATION (SPECIMEN 1 OF 1 COLLECTED 09/20/15): FINDINGS CONSISTENT WITH BENIGN THYROID NODULE (BETHESDA CATEGORY II).   10/28/2015 Tumor Marker   Patient's tumor was tested for the following markers: CA125 Results of the tumor marker test revealed 53.2   11/04/2015 Imaging   Stable benign right thyroid nodule   12/16/2015 Tumor Marker   Patient's tumor was tested for the following markers: CA125 Results of the tumor marker test revealed 38.1   03/22/2016 PET scan   Interval progression of hypermetabolic right paratracheal lymph node consistent with metastatic involvement. 2. Stable hypermetabolic right thyroid nodule. Reportedly this has been biopsied in the past.   03/23/2016 Tumor Marker   Patient's tumor was tested for the following markers: CA125 Results of the tumor marker test revealed 62.4   04/13/2016 - 06/01/2016 Radiation Therapy   She received 56 Gy to the chest in 28 fractions    05/09/2016 Imaging   No evidence of left lower extremity deep vein thrombosis. No evidence of a superficial thrombosis of the greater and lesser saphenous veins. Positive for thrombus noted in several varicosities of the calf. No evidence of Baker's cyst on the left.   05/17/2016 Tumor Marker   Patient's tumor was tested for the  following markers: CA125 Results of the tumor marker test revealed 9   06/29/2016 Tumor Marker   Patient's tumor was tested for the following markers: CA125 Results of the tumor marker test revealed 5.9   08/04/2016 Tumor Marker   Patient's tumor was tested for the following markers: CA125 Results of the tumor marker test revealed 6.0   09/12/2016 PET scan   Complete metabolic response to therapy, with resolution of hypermetabolic mediastinal lymphadenopathy since prior exam. No residual or new metastatic disease identified. Stable hypermetabolic right thyroid lobe nodule, which was previously biopsied on 09/20/2015.   03/13/2017 PET scan   1. Solitary focus of recurrent right paratracheal hypermetabolic activity, with a 1.0 cm right lower paratracheal node having a maximum SUV of 9.0. Appearance compatible with recurrent malignancy. 2. Continued hypermetabolic right thyroid nodule, previously biopsied, presumed benign -correlate with prior biopsy results. 3. Other imaging findings of potential clinical significance: Aortic Atherosclerosis (ICD10-I70.0). Mild cardiomegaly. Prominent stool throughout the colon favors constipation.   04/02/2017 Pathology Results   FINE NEEDLE ASPIRATION, ENDOSCOPIC, (EBUS) 4 R NODE (SPECIMEN 1 OF 2 COLLECTED 04/02/17): MALIGNANT CELLS PRESENT, CONSISTENT WITH CARCINOMA. SEE COMMENT. COMMENT: THE MALIGNANT CELLS ARE POSITIVE FOR P53 AND NEGATIVE FOR ESTROGEN RECEPTOR AND TTF-1. THIS PROFILE IS NON-SPECIFIC, BUT THE P53 POSITIVE STAINING IS SUGGESTIVE OF GYNECOLOGIC PRIMARY.    04/11/2017 Procedure   Successful 8 French right internal jugular vein power port placement with its tip at the SVC/RA junction   04/12/2017 Imaging   Normal LV size with mild LV hypertrophy. EF 55-60%. Normal RV size and systolic function. Aortic valve sclerosis without significant stenosis.   04/18/2017 - 06/14/2017 Chemotherapy   The patient had chemotherapy with Doxil    07/10/2017  Imaging   - Left ventricle: The cavity size was normal. There was mild concentric hypertrophy. Systolic function  was normal. The estimated ejection fraction was in the range of 55% to 60%. Wall motion was normal; there were no regional wall motion abnormalities. Doppler parameters are consistent with abnormal left ventricular relaxation (grade 1 diastolic dysfunction). - Aortic valve: Noncoronary cusp mobility was mildly restricted. - Mitral valve: There was mild regurgitation. - Left atrium: The atrium was mildly dilated. - Atrial septum: No defect or patent foramen ovale was identified.  Impressions:  - Compared to November 2018, global LV longitudinal strain remains normal (has increased)   07/11/2017 PET scan   Increased size and hypermetabolic activity of 3.3 cm right thyroid lobe nodule. Thyroid carcinoma cannot be excluded. Recommend repeat ultrasound guided fine needle aspiration to exclude thyroid carcinoma.  New adjacent hypermetabolic 10 mm right supraclavicular lymph node, suspicious for lymph node metastasis.  Slight increase in size and hypermetabolic activity of solitary right paratracheal lymph node.  No evidence of abdominal or pelvic metastatic disease.   07/18/2017 Procedure   1. Technically successful ultrasound guided fine needle aspiration of indeterminate hypermetabolic right-sided thyroid nodule/mass. 2. Technically successful ultrasound-guided core needle biopsy of hypermetabolic right lower cervical lymph node.   07/18/2017 Pathology Results   THYROID, FINE NEEDLE ASPIRATION RIGHT (SPECIMEN 1 OF 1, COLLECTED ON 07/18/2017): ATYPIA OF UNDETERMINED SIGNIFICANCE OR FOLLICULAR LESION OF UNDETERMINED SIGNIFICANCE (BETHESDA CATEGORY III). SEE COMMENT. COMMENT: THE SPECIMEN CONSISTS OF SMALL AND MEDIUM SIZED GROUPS OF FOLLICULAR EPITHELIAL CELLS WITH MILD CYTOLOGIC ATYPIA INCLUDING NUCLEAR ENLARGEMENT AND HURTHLE CELL CHANGE. SOME GROUPS ARE ARRANGED AS MICROFOLLICLES. THERE  IS MINIMAL BACKGROUND COLLOID. BASED ON THESE FEATURES, A FOLLICULAR LESION/NEOPLASM CAN NOT BE ENTIRELY RULED OUT. A SPECIMEN WILL BE SENT FOR AFIRMA TESTING.   07/19/2017 Pathology Results   Lymph node, needle/core biopsy - METASTATIC PAPILLARY SEROUS CARCINOMA. - SEE COMMENT. Microscopic Comment Dr. Vicente Males has reviewed the case and concurs with this interpretation   10/15/2017 PET scan   No new or progressive disease. No evidence of abdominal or pelvic metastatic disease.  Stable hypermetabolic right thyroid lobe nodule and adjacent right supraclavicular lymph node.  Decreased size and hypermetabolic activity of solitary right paratracheal lymph node.   01/23/2018 PET scan   1. Overall, no significant change from PET-CT of 3 months ago. There is persistent hypermetabolic activity at the right thoracic inlet and within a right paratracheal mediastinal node. The nodes have not significantly changed in size, although the metabolic activity has minimally increased over this interval. It is uncertain how much of the thoracic inlet metabolic activity is attributed to the thyroid nodule versus adjacent cervical lymph nodes, both previously biopsied. 2. No new hypermetabolic activity within the neck, chest, abdomen or pelvis. No evidence of local recurrence in the pelvis. 3. Stable probable radiation changes in the right lung.   04/16/2018 PET scan   1. Interval increase in metabolic activity of the RIGHT supraclavicular node and RIGHT lower paratracheal mediastinal node with minimal change in size. Findings consistent with persistent and mildly progressed metastatic adenopathy. 2. New hypermetabolic small lymph node in the RIGHT lower paratracheal nodal station adjacent to the previously followed node. 3. No evidence of local recurrence in the pelvis or new disease elsewhere.   07/30/2018 PET scan   1. Mild interval progression of right supraclavicular, mediastinal, and right hilar  hypermetabolic metastatic disease. There is a new hypermetabolic lymph node in the subcarinal station on today's study. 2. No hypermetabolic disease in the abdomen or pelvis to suggest recurrent/metastases. 3.  Aortic Atherosclerois (ICD10-170.0)    08/16/2018 -  Chemotherapy   The patient had Lenvima since 3/6 and pembrolizumab since 3/13 for chemotherapy treatment. Michel Santee was held temporarily due to infection and severe hypertension   10/31/2018 PET scan   Partial response to therapy, with decreased hypermetabolic lymphadenopathy in mediastinum and right hilar region.  No significant change in hypermetabolic lymphadenopathy in right inferior neck.  No new or progressive metastatic disease identified. No evidence of recurrent or metastatic carcinoma in the pelvis or abdomen   02/04/2019 PET scan   1. Decrease in metabolic activity of RIGHT supraclavicular node and RIGHT paratracheal lymph nodes. 2. Persistent activity in the RIGHT thyroid nodule unchanged. 3. Diffuse activity through the esophagus is favored esophagitis. No change. 4. Scattered foci of intense metabolic activity associated the bowel without focal lesion on CT favored benign. 5. No evidence of peritoneal metastasis. 6. No evidence of metastatic adenopathy in the abdomen pelvis. 7. No evidence of local pelvic sidewall recurrence.   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      REVIEW OF SYSTEMS:   Constitutional: Denies fevers, chills or abnormal weight loss Eyes: Denies  blurriness of vision Ears, nose, mouth, throat, and face: Denies mucositis or sore throat Respiratory: Denies cough, dyspnea or wheezes Cardiovascular: Denies palpitation, chest discomfort or lower extremity swelling Gastrointestinal:  Denies nausea, heartburn or change in bowel habits Skin: Denies abnormal skin rashes Lymphatics: Denies new lymphadenopathy or easy bruising Neurological:Denies numbness, tingling or new weaknesses Behavioral/Psych: Mood is stable, no new changes  All other systems were reviewed with the patient and are negative.  I have reviewed the past medical history, past surgical history, social history and family history with the patient and they are unchanged from previous note.  ALLERGIES:  has No Known Allergies.  MEDICATIONS:  Current Outpatient Medications  Medication Sig Dispense Refill  . amLODipine (NORVASC) 10 MG tablet TAKE 1 TABLET BY MOUTH DAILY. DECREASE SIMVASTATIN TO 20MG DAILY WHILE ON AMLODIPINE PER MD 90 tablet 1  . Calcium Carb-Cholecalciferol 500-600 MG-UNIT TABS Take 1 tablet by mouth daily.     . Cholecalciferol (VITAMIN D3) 2000 units TABS Take 2,000 Units by mouth daily.     . hydrochlorothiazide (HYDRODIURIL) 50 MG tablet Take 1 tablet (50 mg total) by mouth daily. 90 tablet 11  . lenvatinib 10 mg daily dose (LENVIMA, 10 MG DAILY DOSE,) capsule Take 1 capsule (10 mg total) by mouth daily. 30 capsule 11  . levothyroxine (SYNTHROID) 50 MCG tablet TAKE 1 TABLET (50 MCG TOTAL) BY MOUTH DAILY BEFORE BREAKFAST. 90 tablet 3  . LORazepam (ATIVAN) 0.5 MG tablet Take 0.5 mg by mouth 2 (two) times daily as needed.  0  . losartan (COZAAR) 50 MG tablet Take 1 tablet (50 mg total) by mouth daily. 90 tablet 11  . metoprolol tartrate (LOPRESSOR) 25 MG tablet Take 1 tablet (25 mg total) by mouth 2 (two) times daily. 180 tablet 3  . simvastatin (ZOCOR) 40 MG tablet Take 40 mg by mouth every evening.    . tacrolimus (PROTOPIC) 0.1 % ointment Apply 1 application  topically daily as needed (rash).     Marland Kitchen  venlafaxine XR (EFFEXOR-XR) 75 MG 24 hr capsule Take 75 mg by mouth daily with breakfast.      No current facility-administered medications for this visit.     PHYSICAL EXAMINATION: ECOG PERFORMANCE STATUS: 0 - Asymptomatic  Vitals:   02/05/19 1001  BP: 132/60  Pulse: 85  Resp: 18  Temp: 98.2 F (36.8 C)  SpO2: 100%   Filed Weights   02/05/19 1001  Weight: 160 lb 12.8 oz (72.9 kg)    GENERAL:alert, no distress and comfortable SKIN: skin color, texture, turgor are normal, no rashes or significant lesions EYES: normal, Conjunctiva are pink and non-injected, sclera clear OROPHARYNX:no exudate, no erythema and lips, buccal mucosa, and tongue normal  NECK: supple, thyroid normal size, non-tender, without nodularity LYMPH:  no palpable lymphadenopathy in the cervical, axillary or inguinal LUNGS: clear to auscultation and percussion with normal breathing effort HEART: regular rate & rhythm and no murmurs and no lower extremity edema ABDOMEN:abdomen soft, non-tender and normal bowel sounds Musculoskeletal:no cyanosis of digits and no clubbing  NEURO: alert & oriented x 3 with fluent speech, no focal motor/sensory deficits  LABORATORY DATA:  I have reviewed the data as listed    Component Value Date/Time   NA 139 02/05/2019 1020   NA 141 05/17/2017 0957   K 3.9 02/05/2019 1020   K 3.6 05/17/2017 0957   CL 101 02/05/2019 1020   CL 105 11/19/2012 0848   CO2 28 02/05/2019 1020   CO2 25 05/17/2017 0957   GLUCOSE 98 02/05/2019 1020   GLUCOSE 85 05/17/2017 0957   GLUCOSE 122 (H) 11/19/2012 0848   BUN 20 02/05/2019 1020   BUN 15.2 05/17/2017 0957   CREATININE 0.81 02/05/2019 1020   CREATININE 0.8 05/17/2017 0957   CALCIUM 9.4 02/05/2019 1020   CALCIUM 9.6 05/17/2017 0957   PROT 7.1 02/05/2019 1020   PROT 7.0 05/17/2017 0957   ALBUMIN 3.7 02/05/2019 1020   ALBUMIN 3.9 05/17/2017 0957   AST 24 02/05/2019 1020   AST 25 05/17/2017 0957    ALT 21 02/05/2019 1020   ALT 18 05/17/2017 0957   ALKPHOS 69 02/05/2019 1020   ALKPHOS 57 05/17/2017 0957   BILITOT 0.3 02/05/2019 1020   BILITOT 0.47 05/17/2017 0957   GFRNONAA >60 02/05/2019 1020   GFRAA >60 02/05/2019 1020    No results found for: SPEP, UPEP  Lab Results  Component Value Date   WBC 4.0 02/04/2019   NEUTROABS 2.7 02/04/2019   HGB 10.9 (L) 02/04/2019   HCT 32.5 (L) 02/04/2019   MCV 95.9 02/04/2019   PLT 178 02/04/2019      Chemistry      Component Value Date/Time   NA 139 02/05/2019 1020   NA 141 05/17/2017 0957   K 3.9 02/05/2019 1020   K 3.6 05/17/2017 0957   CL 101 02/05/2019 1020   CL 105 11/19/2012 0848   CO2 28 02/05/2019 1020   CO2 25 05/17/2017 0957   BUN 20 02/05/2019 1020   BUN 15.2 05/17/2017 0957   CREATININE 0.81 02/05/2019 1020   CREATININE 0.8 05/17/2017 0957   GLU 158 (H) 01/14/2015 1035      Component Value Date/Time   CALCIUM 9.4 02/05/2019 1020   CALCIUM 9.6 05/17/2017 0957   ALKPHOS 69 02/05/2019 1020   ALKPHOS 57 05/17/2017 0957   AST 24 02/05/2019 1020   AST 25 05/17/2017 0957   ALT 21 02/05/2019 1020   ALT 18 05/17/2017 0957   BILITOT 0.3 02/05/2019 1020  BILITOT 0.47 05/17/2017 0957       RADIOGRAPHIC STUDIES: I have reviewed multiple imaging studies with the patient I have personally reviewed the radiological images as listed and agreed with the findings in the report. Nm Pet Image Restag (ps) Skull Base To Thigh  Result Date: 02/04/2019 CLINICAL DATA:  Sub treatment strategy for endometrial carcinoma. Immunotherapy ongoing. EXAM: NUCLEAR MEDICINE PET SKULL BASE TO THIGH TECHNIQUE: 7.93 mCi F-18 FDG was injected intravenously. Full-ring PET imaging was performed from the skull base to thigh after the radiotracer. CT data was obtained and used for attenuation correction and anatomic localization. Fasting blood glucose: 94 mg/dl COMPARISON:  PET-CT 10/31/2018, 07/30/2018, 04/16/2018 FINDINGS: Mediastinal blood pool  activity: SUV max 2.1 Liver activity: SUV max NA NECK: Persistent intense hypermetabolic activity associated with the RIGHT thyroid nodule similar to comparison exam. Adjacent hypermetabolic RIGHT supraclavicular node with SUV max equal 5.7 is decreased from SUV max equal 11.5. Incidental CT findings: None CHEST: RIGHT paratracheal lymph node is decrease in metabolic activity SUV max equal 3.3 compared with SUV max equal 7.2. Long segment of metabolic activity associated with the esophagus without focal lesion on CT. No change from prior. Incidental CT findings: No suspicious pulmonary nodules ABDOMEN/PELVIS: No abnormal activity liver. Scattered activity associated with colon is similar to comparison exam. No focal thickening identified on the CT portion exam. No peritoneal or retroperitoneal metastasis identified. No pelvic sidewall mass.  Post hysterectomy anatomy. No retroperitoneal hypermetabolic lymph nodes. The pelvic lymph nodes. Incidental CT findings: none SKELETON: No focal hypermetabolic activity to suggest skeletal metastasis. Incidental CT findings: none IMPRESSION: 1. Decrease in metabolic activity of RIGHT supraclavicular node and RIGHT paratracheal lymph nodes. 2. Persistent activity in the RIGHT thyroid nodule unchanged. 3. Diffuse activity through the esophagus is favored esophagitis. No change. 4. Scattered foci of intense metabolic activity associated the bowel without focal lesion on CT favored benign. 5. No evidence of peritoneal metastasis. 6. No evidence of metastatic adenopathy in the abdomen pelvis. 7. No evidence of local pelvic sidewall recurrence. Electronically Signed   By: Suzy Bouchard M.D.   On: 02/04/2019 12:41    All questions were answered. The patient knows to call the clinic with any problems, questions or concerns. No barriers to learning was detected.  I spent 25 minutes counseling the patient face to face. The total time spent in the appointment was 30 minutes and  more than 50% was on counseling and review of test results  [name], MD 02/06/2019 9:52 AM

## 2019-02-06 NOTE — Assessment & Plan Note (Signed)
I have reviewed multiple PET scan with the patient The patient has positive response to treatment She has intermittent proteinuria secondary to [facility], of which the dose has already been reduced We will proceed with pembrolizumab but will hold Lenvima for 1 week before resumption The plan would be to continue treatment indefinitely I do not plan to repeat another imaging study for another 6 months, due next around March 2021

## 2019-02-10 MED FILL — LENVIMA 10 MG DAILY DOSE: 10 | 30 days supply | Qty: 30 | Fill #3

## 2019-02-11 IMAGING — CT NM PET TUM IMG RESTAG (PS) SKULL BASE T - THIGH
1 of 9 series · 1 of 25 positions shown · non-contrast
Comparison: PET-CT 10/15/2017 and 07/11/2017. Images from thyroid
and right cervical nodal biopsy 07/18/2017.

CLINICAL DATA: Subsequent treatment strategy for endometrial cancer
(high-grade papillary serous endometrial carcinoma diagnosed in
2128).

EXAM:
NUCLEAR MEDICINE PET SKULL BASE TO THIGH
TECHNIQUE: 7.7 mCi F-18 FDG was injected intravenously. Full-ring PET imaging
was performed from the skull base to thigh after the radiotracer. CT
data was obtained and used for attenuation correction and anatomic
localization.
Fasting blood glucose: 69 mg/dl

[Series 4: ct sk_thigh 5.0 b31f · axial · 5.0mm · 0.98mm/px · 1 of 215 slices shown]
[im 215/215  brain]
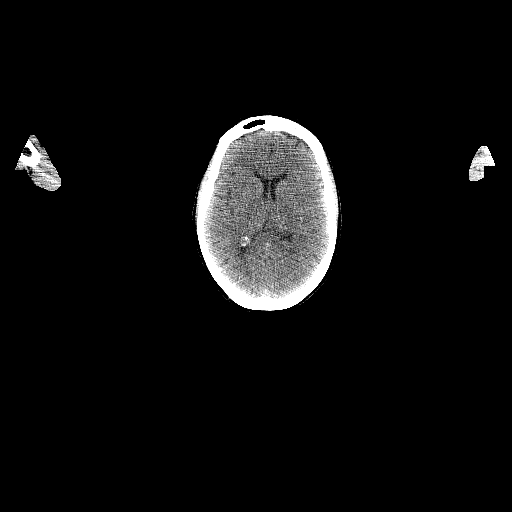

[1 of 25 positions shown; findings below may reference images not displayed]

FINDINGS: Mediastinal blood pool activity: SUV max

NECK:

There is persistent hypermetabolic lower right paratracheal
activity, corresponding with a right thyroid nodule and/or adjacent
cervical adenopathy. This currently has an SUV max of
(previously 10.2). Corresponding soft tissue nodule measuring 2.9 x
1.8 cm on image 46/4 is unchanged. Both the thyroid nodule and an
adjacent cervical lymph node have been previously biopsied.
Metastatic disease was present in the cervical lymph node. No other
hypermetabolic cervical lymph nodes are demonstrated.There are no
lesions of the pharyngeal mucosal space. Activity within the
lymphoid tissue of Waldeyer's ring is similar to the previous study
and within physiologic limits.

Incidental CT findings: none

CHEST:

9 mm hypermetabolic right paratracheal node is not significantly
changed in size, although has slightly increased metabolic activity
compared with the prior study. Currently, this has an SUV max of
7.9. Previously 5.2. No other hypermetabolic mediastinal, hilar or
axillary lymph nodes. No suspicious pulmonary activity.

Incidental CT findings: There is stable linear scarring medially in
the right upper lobe, likely from previous radiation therapy. Right
IJ Port-A-Cath extends to the level of the upper right atrium. Mild
aortic atherosclerosis and probable aortic valvular calcifications.

ABDOMEN/PELVIS:

There is no hypermetabolic activity within the liver, adrenal
glands, spleen or pancreas. There is no hypermetabolic nodal
activity. There is no adnexal mass or abnormal pelvic activity post
hysterectomy.

Incidental CT findings: Mild aortic atherosclerosis.

SKELETON:

There is no hypermetabolic activity to suggest osseous metastatic
disease.

Incidental CT findings: none
IMPRESSION: 1. Overall, no significant change from PET-CT of 3 months ago. There
is persistent hypermetabolic activity at the right thoracic inlet
and within a right paratracheal mediastinal node. The nodes have not
significantly changed in size, although the metabolic activity has
minimally increased over this interval. It is uncertain how much of
the thoracic inlet metabolic activity is attributed to the thyroid
nodule versus adjacent cervical lymph nodes, both previously
biopsied.
2. No new hypermetabolic activity within the neck, chest, abdomen or
pelvis. No evidence of local recurrence in the pelvis.
3. Stable probable radiation changes in the right lung.

## 2019-02-13 MED FILL — VENLAFAXINE HCL ER 150 MG C: 150 | 90 days supply | Qty: 180 | Fill #1

## 2019-02-17 MED FILL — LOSARTAN POTASSIUM 50 MG TA: 50 | 30 days supply | Qty: 30 | Fill #5

## 2019-02-25 MED FILL — busPIRone HCL 10 MG TABS: 10 | 90 days supply | Qty: 180 | Fill #1

## 2019-02-27 ENCOUNTER — Encounter: Payer: Self-pay | Admitting: Hematology and Oncology

## 2019-02-27 ENCOUNTER — Inpatient Hospital Stay (HOSPITAL_BASED_OUTPATIENT_CLINIC_OR_DEPARTMENT_OTHER): Payer: 59 | Admitting: Hematology and Oncology

## 2019-02-27 ENCOUNTER — Other Ambulatory Visit: Payer: Self-pay

## 2019-02-27 ENCOUNTER — Inpatient Hospital Stay: Payer: 59

## 2019-02-27 DIAGNOSIS — Z7689 Persons encountering health services in other specified circumstances: Secondary | ICD-10-CM | POA: Diagnosis not present

## 2019-02-27 DIAGNOSIS — Z95828 Presence of other vascular implants and grafts: Secondary | ICD-10-CM

## 2019-02-27 DIAGNOSIS — C770 Secondary and unspecified malignant neoplasm of lymph nodes of head, face and neck: Secondary | ICD-10-CM

## 2019-02-27 DIAGNOSIS — I10 Essential (primary) hypertension: Secondary | ICD-10-CM

## 2019-02-27 DIAGNOSIS — L271 Localized skin eruption due to drugs and medicaments taken internally: Secondary | ICD-10-CM

## 2019-02-27 DIAGNOSIS — C541 Malignant neoplasm of endometrium: Secondary | ICD-10-CM

## 2019-02-27 DIAGNOSIS — Z23 Encounter for immunization: Secondary | ICD-10-CM | POA: Diagnosis not present

## 2019-02-27 DIAGNOSIS — Z9221 Personal history of antineoplastic chemotherapy: Secondary | ICD-10-CM | POA: Diagnosis not present

## 2019-02-27 DIAGNOSIS — Z7189 Other specified counseling: Secondary | ICD-10-CM

## 2019-02-27 DIAGNOSIS — Z5112 Encounter for antineoplastic immunotherapy: Secondary | ICD-10-CM | POA: Diagnosis not present

## 2019-02-27 DIAGNOSIS — R809 Proteinuria, unspecified: Secondary | ICD-10-CM | POA: Diagnosis not present

## 2019-02-27 DIAGNOSIS — E041 Nontoxic single thyroid nodule: Secondary | ICD-10-CM | POA: Diagnosis not present

## 2019-02-27 DIAGNOSIS — E039 Hypothyroidism, unspecified: Secondary | ICD-10-CM | POA: Diagnosis not present

## 2019-02-27 DIAGNOSIS — Z923 Personal history of irradiation: Secondary | ICD-10-CM | POA: Diagnosis not present

## 2019-02-27 LAB — CBC WITH DIFFERENTIAL/PLATELET
Abs Immature Granulocytes: 0 10*3/uL (ref 0.00–0.07)
Basophils Absolute: 0.1 10*3/uL (ref 0.0–0.1)
Basophils Relative: 1 %
Eosinophils Absolute: 0.2 10*3/uL (ref 0.0–0.5)
Eosinophils Relative: 6 %
HCT: 34.4 % — ABNORMAL LOW (ref 36.0–46.0)
Hemoglobin: 11.6 g/dL — ABNORMAL LOW (ref 12.0–15.0)
Immature Granulocytes: 0 %
Lymphocytes Relative: 22 %
Lymphs Abs: 0.8 10*3/uL (ref 0.7–4.0)
MCH: 32.8 pg (ref 26.0–34.0)
MCHC: 33.7 g/dL (ref 30.0–36.0)
MCV: 97.2 fL (ref 80.0–100.0)
Monocytes Absolute: 0.3 10*3/uL (ref 0.1–1.0)
Monocytes Relative: 7 %
Neutro Abs: 2.5 10*3/uL (ref 1.7–7.7)
Neutrophils Relative %: 64 %
Platelets: 172 10*3/uL (ref 150–400)
RBC: 3.54 MIL/uL — ABNORMAL LOW (ref 3.87–5.11)
RDW: 12.4 % (ref 11.5–15.5)
WBC: 3.8 10*3/uL — ABNORMAL LOW (ref 4.0–10.5)
nRBC: 0 % (ref 0.0–0.2)

## 2019-02-27 LAB — COMPREHENSIVE METABOLIC PANEL
ALT: 16 U/L (ref 0–44)
AST: 22 U/L (ref 15–41)
Albumin: 4 g/dL (ref 3.5–5.0)
Alkaline Phosphatase: 60 U/L (ref 38–126)
Anion gap: 7 (ref 5–15)
BUN: 20 mg/dL (ref 8–23)
CO2: 30 mmol/L (ref 22–32)
Calcium: 9.7 mg/dL (ref 8.9–10.3)
Chloride: 102 mmol/L (ref 98–111)
Creatinine, Ser: 0.9 mg/dL (ref 0.44–1.00)
GFR calc Af Amer: >60 mL/min (ref >60)
GFR calc non Af Amer: >60 mL/min (ref >60)
Glucose, Bld: 101 mg/dL — ABNORMAL HIGH (ref 70–99)
Potassium: 4 mmol/L (ref 3.5–5.1)
Sodium: 139 mmol/L (ref 135–145)
Total Bilirubin: 0.4 mg/dL (ref 0.3–1.2)
Total Protein: 7.3 g/dL (ref 6.5–8.1)

## 2019-02-27 LAB — TSH: TSH: 1.41 u[IU]/mL (ref 0.308–3.960)

## 2019-02-27 LAB — TOTAL PROTEIN, URINE DIPSTICK: Protein, ur: 30 mg/dL — AB

## 2019-02-27 MED ORDER — SODIUM CHLORIDE 0.9 % IV SOLN
Freq: Once | INTRAVENOUS | Status: AC
  Administered 2019-02-27: 12:00:00 via INTRAVENOUS
  Filled 2019-02-27: qty 250

## 2019-02-27 MED ORDER — INFLUENZA VAC A&B SA ADJ QUAD 0.5 ML IM PRSY
0.5000 mL | PREFILLED_SYRINGE | Freq: Once | INTRAMUSCULAR | Status: AC
  Administered 2019-02-27: 0.5 mL via INTRAMUSCULAR

## 2019-02-27 MED ORDER — HEPARIN SOD (PORK) LOCK FLUSH 100 UNIT/ML IV SOLN
500.0000 [IU] | Freq: Once | INTRAVENOUS | Status: AC | PRN
  Administered 2019-02-27: 500 [IU]
  Filled 2019-02-27: qty 5

## 2019-02-27 MED ORDER — INFLUENZA VAC A&B SA ADJ QUAD 0.5 ML IM PRSY
PREFILLED_SYRINGE | INTRAMUSCULAR | Status: AC
  Filled 2019-02-27: qty 0.5

## 2019-02-27 MED ORDER — SODIUM CHLORIDE 0.9% FLUSH
10.0000 mL | Freq: Once | INTRAVENOUS | Status: AC
  Administered 2019-02-27: 10 mL
  Filled 2019-02-27: qty 10

## 2019-02-27 MED ORDER — SODIUM CHLORIDE 0.9 % IV SOLN
200.0000 mg | Freq: Once | INTRAVENOUS | Status: AC
  Administered 2019-02-27: 200 mg via INTRAVENOUS
  Filled 2019-02-27: qty 8

## 2019-02-27 MED ORDER — SODIUM CHLORIDE 0.9% FLUSH
10.0000 mL | INTRAVENOUS | Status: DC | PRN
  Administered 2019-02-27: 10 mL
  Filled 2019-02-27: qty 10

## 2019-02-27 NOTE — Assessment & Plan Note (Signed)
She has mild intermittent flare of hand-foot syndrome We have already reduced her treatment recently I recommend topical emollient cream

## 2019-02-27 NOTE — Patient Instructions (Signed)

## 2019-02-27 NOTE — Progress Notes (Signed)
[facility] OFFICE PROGRESS NOTE  Patient Care Team: [name], MD as PCP - General (Family Medicine) Reynold Bowen, MD as Consulting Physician (Endocrinology)  ASSESSMENT & PLAN:  Endometrial cancer [facility]) Recent PET scan showed positive response to treatment She has intermittent proteinuria secondary to [facility], of which the dose has already been reduced We will proceed with pembrolizumab The plan would be to continue treatment indefinitely I do not plan to repeat another imaging study for another few months, due next around March 2021  Essential hypertension She has intermittent proteinuria even though her blood pressure remains well controlled She will continue aggressive blood pressure management  Hand foot syndrome She has mild intermittent flare of hand-foot syndrome We have already reduced her treatment recently I recommend topical emollient cream   No orders of the defined types were placed in this encounter.   INTERVAL HISTORY: Please see below for problem oriented charting. She returns for further follow-up She continues to have intermittent flare of hand-foot syndrome but it does not bother her recently No recent infection, fever or chills Her appetite is stable Denies infusion reaction  SUMMARY OF ONCOLOGIC HISTORY: Oncology History Overview Note  [employer] testing done 11-2015 on surgical path from 2014: MS stable TMB low 4 muts/mb ATM [id number] ERBB3 T389K E2H2 rearrangement exon 9 PPP2R1A P179R TP53 I195N    ER- APPROXIMATELY 25-35% STAINING IN NEOPLASTIC CELLS (INTERMEDIATE)  PR- APPROXIMATELY 25-35% STAINING IN NEOPLASTIC CELLS (STRONG)  Repeat biopsy 04/02/17: ER negative, Her 2 negative  Progressed on Doxil   Endometrial cancer ([place])  06/20/2012 Pathology Results   Biopsy positive for papillary serous carcinoma   06/20/2012 Genetic Testing   Foundation One testing done 11-2015 on surgical path from  2014: MS stable TMB low 4 muts/mb ATM G40102 ERBB3 T389K E2H2 rearrangement exon 9 PPP2R1A P179R TP53 I195N    06/20/2012 Initial Diagnosis   Patient presented to PCP with intermittent vaginal bleeding since ~ Oct 2013, endometrial biopsy 06-20-12 with complex endometrial hyperplasia with atypia   06/26/2012 Imaging   Thickened endometrial lining in a postmenopausal patient experiencing vaginal bleeding. In the setting of post-menopausal bleeding, endometrial sampling is indicated to exclude carcinoma. No focal myometrial abnormalities are seen.  Normal left ovary and non-visualized right ovary   07/30/2012 Surgery   Dr. Polly Cobia performed robotic hysterectomy with bilateral salpingo-oophorectomy, bilateral pelvic lymph node dissection and periaortic lymph node dissection. Intraoperatively on frozen section, the patient was noted to have a large endometrial polyp with changes within the polyp consistent for high-grade malignancy, possibly papillary serous carcinoma. There is no obvious extrauterine disease noted.     07/30/2012 Pathology Results   [phone] SUPPLEMENTAL REPORT  THE ENDOMETRIAL CARCINOMA WAS ANALYZED FOR DNA MISMATCH REPAIR PROTEINS.IMMUNOHISTOCHEMICALLY, THE NEOPLASM RETAINED NUCLEAR EXPRESSION OF 4 GENE PRODUCTS, MLH1, MSH2, MSH6, AND PMS2, INVOLVED IN DNA MISMATCH REPAIR.POSITIVE AND NEGATIVE CONTROLS WORKED APPROPRIATELY.  PER REQUEST, AN ER AND PR ARE PERFORMED ON BLOCK 1I.  ER- APPROXIMATELY 25-35% STAINING IN NEOPLASTIC CELLS (INTERMEDIATE)  PR- APPROXIMATELY 25-35% STAINING IN NEOPLASTIC CELLS (STRONG)  ER AND PR PREDOMINANTLY SHOW STAINING IN THE SEROUS COMPONENT.  DIAGNOSIS  1. UTERUS, CERVIX WITH BILATERAL FALLOPIAN TUBES AND OVARIES,  HYSTERECTOMY AND BILATERAL SALPINGO-OOPHORECTOMY:  HIGH GRADE/POORLY DIFFERENTIATED ENDOMETRIAL ADENOCARCINOMA WITH SOLID AND SEROUS PAPILLARY COMPONENTS.  HISTOPATHOLOGIC TYPE:A VARIETY OF PATTERNS  WERE PRESENT, ALL OF WHICH SHOULD BE CONSIDERED TO BE HIGH GRADE.SEROUS PAPILLARY CARCINOMA WAS SEEN OCCURRING ADJACENT TO SOLID ENDOMETRIAL CARCINOMA WITH MARKED ANAPLASIA AND GIANT CELLS. QQVZ:DGLOV  MEASURED AT LEAST 4.8 CM IN GREATEST HORIZONTAL DIMENSION.  GRADE: POORLY DIFFERENTIATED OR HIGH GRADE. DEPTH OF INVASION: NO DEFINITE MYOMETRIAL INVOLVEMENT WAS SEEN.THE TUMOR APPEARED CONFINED TO THE POLYP AS WELL AS SURFACE ENDOMETRIUM. WHERE MYOMETRIAL THICKNESS NOT APPLICABLE. SEROSAL INVOLVEMENT: NOT DEMONSTRATED.  ENDOCERVICAL INVOLVEMENT:NOT DEMONSTRATED. RESECTION MARGINS: FREE OF INVOLVEMENT. EXTRAUTERINE EXTENSION:NOT DEMONSTRATED. ANGIOLYMPHATIC INVASION: NOT DEFINITELY SEEN. TOTAL NODES EXAMINED:22.  PELVIC NODES EXAMINED:20.  PELVIC NODES INVOLVED:0.  PARA-AORTIC NODES 2. EXAMINED:  PARA-AORTIC NODES 0. INVOLVED TNM STAGE: T1A N0 MX. AJCC STAGE GROUPING: IA. FIGO STAGE:IA.   08/26/2012 Imaging   No CT evidence for intra-abdominal or pelvic metastatic disease. Trace free pelvic fluid, presumably postoperative although the date of surgery is not documented in the electronic medical record.   08/26/2012 - 12/13/2012 Chemotherapy   She received 6 cycles of carbo/taxol    10/29/2012 - 11/27/2012 Radiation Therapy   May 27, June 5,  June 11, June 19, November 27, 2012: Proximal vagina 30 Gy in 5 fractions     01/17/2013 Imaging   1.  No evidence of recurrent or metastatic disease. 2.  No acute abnormality involving the abdomen or pelvis. 3.  Mild diffuse hepatic steatosis. 4.  Very small supraumbilical midline anterior abdominal wall hernia containing fat, unchanged   01/22/2014 Imaging   No evidence for metastatic or recurrent disease. 2. No bowel obstruction.  Normal appendix. 3. Small fat containing hernia, stable in appearance. 4. Status post hysterectomy and bilateral oophorectomy    02/19/2014 Imaging   No pulmonary lesions are identified. The abnormality on the chest x-ray is due to asymmetric left-sided sternoclavicular joint degenerative disease   10/12/2014 Imaging   New mild retroperitoneal lymphadenopathy in the left paraaortic region and proximal left common iliac chain, consistent with metastatic disease. No other sites of metastatic disease identified within the abdomen or pelvis.     11/27/2014 - 02/11/2015 Chemotherapy   She received 4 cycles of carbo/taxol    03/01/2015 PET scan   Single hypermetabolic small retroperitoneal lymph node along the aorta. 2. No evidence of metastatic disease otherwise in the abdomen or pelvis. No evidence local recurrence. 3. Intensely hypermetabolic enlarged nodule adjacent to the RIGHT lobe of thyroid gland. This presumably represents the biopsied lesion in clinician report which was found to be benign thyroid tissue.   04/05/2015 - 05/13/2015 Radiation Therapy   She received 50.4 gray in 28 fractions with simultaneous integrated boost to 56 gray   06/17/2015 Imaging   No acute process or evidence of metastatic disease in the abdomen or pelvis. Resolution of previously described retroperitoneal adenopathy. 2.  Possible constipation. 3. Atherosclerosis.   06/17/2015 Tumor Marker   Patient's tumor was tested for the following markers: CA125 Results of the tumor marker test revealed 33   08/12/2015 Tumor Marker   Patient's tumor was tested for the following markers: CA125 Results of the tumor marker test revealed 52   08/31/2015 PET scan   Development of right paratracheal hypermetabolic adenopathy, consistent with nodal metastasis. 2. The previously described isolated abdominal retroperitoneal hypermetabolic node has resolved. 3. Persistent hypermetabolic right thyroid nodule, per report previously biopsied. Correlate with those results. 4.  Possible constipation.   09/09/2015 -  Anti-estrogen oral therapy   She has been receiving  alternative treatment between megace and tamoxifen   09/09/2015 Tumor Marker   Patient's tumor was tested for the following markers: CA125 Results of the tumor marker test revealed 37.8   09/20/2015 Procedure   Technically successful ultrasound-guided thyroid aspiration biopsy ,  dominant right nodule   09/20/2015 Pathology Results   THYROID, RIGHT, FINE NEEDLE ASPIRATION (SPECIMEN 1 OF 1 COLLECTED 09/20/15): FINDINGS CONSISTENT WITH BENIGN THYROID NODULE (BETHESDA CATEGORY II).   10/28/2015 Tumor Marker   Patient's tumor was tested for the following markers: CA125 Results of the tumor marker test revealed 53.2   11/04/2015 Imaging   Stable benign right thyroid nodule   12/16/2015 Tumor Marker   Patient's tumor was tested for the following markers: CA125 Results of the tumor marker test revealed 38.1   03/22/2016 PET scan   Interval progression of hypermetabolic right paratracheal lymph node consistent with metastatic involvement. 2. Stable hypermetabolic right thyroid nodule. Reportedly this has been biopsied in the past.   03/23/2016 Tumor Marker   Patient's tumor was tested for the following markers: CA125 Results of the tumor marker test revealed 62.4   04/13/2016 - 06/01/2016 Radiation Therapy   She received 56 Gy to the chest in 28 fractions    05/09/2016 Imaging   No evidence of left lower extremity deep vein thrombosis. No evidence of a superficial thrombosis of the greater and lesser saphenous veins. Positive for thrombus noted in several varicosities of the calf. No evidence of Baker's cyst on the left.   05/17/2016 Tumor Marker   Patient's tumor was tested for the following markers: CA125 Results of the tumor marker test revealed 9   06/29/2016 Tumor Marker   Patient's tumor was tested for the following markers: CA125 Results of the tumor marker test revealed 5.9   08/04/2016 Tumor Marker   Patient's tumor was tested for the following markers: CA125 Results of the tumor  marker test revealed 6.0   09/12/2016 PET scan   Complete metabolic response to therapy, with resolution of hypermetabolic mediastinal lymphadenopathy since prior exam. No residual or new metastatic disease identified. Stable hypermetabolic right thyroid lobe nodule, which was previously biopsied on 09/20/2015.   03/13/2017 PET scan   1. Solitary focus of recurrent right paratracheal hypermetabolic activity, with a 1.0 cm right lower paratracheal node having a maximum SUV of 9.0. Appearance compatible with recurrent malignancy. 2. Continued hypermetabolic right thyroid nodule, previously biopsied, presumed benign -correlate with prior biopsy results. 3. Other imaging findings of potential clinical significance: Aortic Atherosclerosis (ICD10-I70.0). Mild cardiomegaly. Prominent stool throughout the colon favors constipation.   04/02/2017 Pathology Results   FINE NEEDLE ASPIRATION, ENDOSCOPIC, (EBUS) 4 R NODE (SPECIMEN 1 OF 2 COLLECTED 04/02/17): MALIGNANT CELLS PRESENT, CONSISTENT WITH CARCINOMA. SEE COMMENT. COMMENT: THE MALIGNANT CELLS ARE POSITIVE FOR P53 AND NEGATIVE FOR ESTROGEN RECEPTOR AND TTF-1. THIS PROFILE IS NON-SPECIFIC, BUT THE P53 POSITIVE STAINING IS SUGGESTIVE OF GYNECOLOGIC PRIMARY.    04/11/2017 Procedure   Successful 8 French right internal jugular vein power port placement with its tip at the SVC/RA junction   04/12/2017 Imaging   Normal LV size with mild LV hypertrophy. EF 55-60%. Normal RV size and systolic function. Aortic valve sclerosis without significant stenosis.   04/18/2017 - 06/14/2017 Chemotherapy   The patient had chemotherapy with Doxil    07/10/2017 Imaging   - Left ventricle: The cavity size was normal. There was mild concentric hypertrophy. Systolic function was normal. The estimated ejection fraction was in the range of 55% to 60%. Wall motion was normal; there were no regional wall motion abnormalities. Doppler parameters are consistent with abnormal left  ventricular relaxation (grade 1 diastolic dysfunction). - Aortic valve: Noncoronary cusp mobility was mildly restricted. - Mitral valve: There was mild regurgitation. - Left atrium: The atrium  was mildly dilated. - Atrial septum: No defect or patent foramen ovale was identified.  Impressions:  - Compared to November 2018, global LV longitudinal strain remains normal (has increased)   07/11/2017 PET scan   Increased size and hypermetabolic activity of 3.3 cm right thyroid lobe nodule. Thyroid carcinoma cannot be excluded. Recommend repeat ultrasound guided fine needle aspiration to exclude thyroid carcinoma.  New adjacent hypermetabolic 10 mm right supraclavicular lymph node, suspicious for lymph node metastasis.  Slight increase in size and hypermetabolic activity of solitary right paratracheal lymph node.  No evidence of abdominal or pelvic metastatic disease.   07/18/2017 Procedure   1. Technically successful ultrasound guided fine needle aspiration of indeterminate hypermetabolic right-sided thyroid nodule/mass. 2. Technically successful ultrasound-guided core needle biopsy of hypermetabolic right lower cervical lymph node.   07/18/2017 Pathology Results   THYROID, FINE NEEDLE ASPIRATION RIGHT (SPECIMEN 1 OF 1, COLLECTED ON 07/18/2017): ATYPIA OF UNDETERMINED SIGNIFICANCE OR FOLLICULAR LESION OF UNDETERMINED SIGNIFICANCE (BETHESDA CATEGORY III). SEE COMMENT. COMMENT: THE SPECIMEN CONSISTS OF SMALL AND MEDIUM SIZED GROUPS OF FOLLICULAR EPITHELIAL CELLS WITH MILD CYTOLOGIC ATYPIA INCLUDING NUCLEAR ENLARGEMENT AND HURTHLE CELL CHANGE. SOME GROUPS ARE ARRANGED AS MICROFOLLICLES. THERE IS MINIMAL BACKGROUND COLLOID. BASED ON THESE FEATURES, A FOLLICULAR LESION/NEOPLASM CAN NOT BE ENTIRELY RULED OUT. A SPECIMEN WILL BE SENT FOR AFIRMA TESTING.   07/19/2017 Pathology Results   Lymph node, needle/core biopsy - METASTATIC PAPILLARY SEROUS CARCINOMA. - SEE COMMENT. Microscopic Comment Dr. Vicente Males has reviewed the case and concurs with this interpretation   10/15/2017 PET scan   No new or progressive disease. No evidence of abdominal or pelvic metastatic disease.  Stable hypermetabolic right thyroid lobe nodule and adjacent right supraclavicular lymph node.  Decreased size and hypermetabolic activity of solitary right paratracheal lymph node.   01/23/2018 PET scan   1. Overall, no significant change from PET-CT of 3 months ago. There is persistent hypermetabolic activity at the right thoracic inlet and within a right paratracheal mediastinal node. The nodes have not significantly changed in size, although the metabolic activity has minimally increased over this interval. It is uncertain how much of the thoracic inlet metabolic activity is attributed to the thyroid nodule versus adjacent cervical lymph nodes, both previously biopsied. 2. No new hypermetabolic activity within the neck, chest, abdomen or pelvis. No evidence of local recurrence in the pelvis. 3. Stable probable radiation changes in the right lung.   04/16/2018 PET scan   1. Interval increase in metabolic activity of the RIGHT supraclavicular node and RIGHT lower paratracheal mediastinal node with minimal change in size. Findings consistent with persistent and mildly progressed metastatic adenopathy. 2. New hypermetabolic small lymph node in the RIGHT lower paratracheal nodal station adjacent to the previously followed node. 3. No evidence of local recurrence in the pelvis or new disease elsewhere.   07/30/2018 PET scan   1. Mild interval progression of right supraclavicular, mediastinal, and right hilar hypermetabolic metastatic disease. There is a new hypermetabolic lymph node in the subcarinal station on today's study. 2. No hypermetabolic disease in the abdomen or pelvis to suggest recurrent/metastases. 3.  Aortic Atherosclerois (ICD10-170.0)    08/16/2018 -  Chemotherapy   The patient had Lenvima since 3/6 and  pembrolizumab since 3/13 for chemotherapy treatment. Michel Santee was held temporarily due to infection and severe hypertension   10/31/2018 PET scan   Partial response to therapy, with decreased hypermetabolic lymphadenopathy in mediastinum and right hilar region.  No significant change in hypermetabolic lymphadenopathy in right inferior  neck.  No new or progressive metastatic disease identified. No evidence of recurrent or metastatic carcinoma in the pelvis or abdomen   02/04/2019 PET scan   1. Decrease in metabolic activity of RIGHT supraclavicular node and RIGHT paratracheal lymph nodes. 2. Persistent activity in the RIGHT thyroid nodule unchanged. 3. Diffuse activity through the esophagus is favored esophagitis. No change. 4. Scattered foci of intense metabolic activity associated the bowel without focal lesion on CT favored benign. 5. No evidence of peritoneal metastasis. 6. No evidence of metastatic adenopathy in the abdomen pelvis. 7. No evidence of local pelvic sidewall recurrence.   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      REVIEW OF SYSTEMS:   Constitutional: Denies fevers, chills or abnormal weight loss Eyes: Denies blurriness of vision Ears, nose, mouth, throat, and face: Denies mucositis or sore throat Respiratory: Denies cough, dyspnea or wheezes Cardiovascular: Denies palpitation, chest discomfort or lower extremity swelling Gastrointestinal:  Denies nausea, heartburn or change in bowel habits Lymphatics: Denies new  lymphadenopathy or easy bruising Neurological:Denies numbness, tingling or new weaknesses Behavioral/Psych: Mood is stable, no new changes  All other systems were reviewed with the patient and are negative.  I have reviewed the past medical history, past surgical history, social history and family history with the patient and they are unchanged from previous note.  ALLERGIES:  has No Known Allergies.  MEDICATIONS:  Current Outpatient Medications  Medication Sig Dispense Refill  . amLODipine (NORVASC) 10 MG tablet TAKE 1 TABLET BY MOUTH DAILY. DECREASE SIMVASTATIN TO 20MG DAILY WHILE ON AMLODIPINE PER MD 90 tablet 1  . Calcium Carb-Cholecalciferol 500-600 MG-UNIT TABS Take 1 tablet by mouth daily.     . Cholecalciferol (VITAMIN D3) 2000 units TABS Take 2,000 Units by mouth daily.     . hydrochlorothiazide (HYDRODIURIL) 50 MG tablet Take 1 tablet (50 mg total) by mouth daily. 90 tablet 11  . lenvatinib 10 mg daily dose (LENVIMA, 10 MG DAILY DOSE,) capsule Take 1 capsule (10 mg total) by mouth daily. 30 capsule 11  . levothyroxine (SYNTHROID) 50 MCG tablet TAKE 1 TABLET (50 MCG TOTAL) BY MOUTH DAILY BEFORE BREAKFAST. 90 tablet 3  . LORazepam (ATIVAN) 0.5 MG tablet Take 0.5 mg by mouth 2 (two) times daily as needed.  0  . losartan (COZAAR) 50 MG tablet Take 1 tablet (50 mg total) by mouth daily. 90 tablet 11  . metoprolol tartrate (LOPRESSOR) 25 MG tablet Take 1 tablet (25 mg total) by mouth 2 (two) times daily. 180 tablet 3  . simvastatin (ZOCOR) 40 MG tablet Take 40 mg by mouth every evening.    . tacrolimus (PROTOPIC) 0.1 % ointment Apply 1 application topically daily as needed (rash).     . venlafaxine XR (EFFEXOR-XR) 75 MG 24 hr capsule Take 75 mg by mouth daily with breakfast.      No current facility-administered medications for this visit.     PHYSICAL EXAMINATION: ECOG PERFORMANCE STATUS: 1 - Symptomatic but completely ambulatory  Vitals:   02/27/19 1119  BP: (!) 127/55  Pulse:  71  Resp: 18  Temp: 98.9 F (37.2 C)  SpO2: 100%   Filed Weights   02/27/19 1119  Weight: 158 lb 6.4 oz (71.8 kg)    GENERAL:alert, no distress and comfortable SKIN: Noted dry skin on her hands EYES: normal, Conjunctiva are pink and non-injected, sclera clear OROPHARYNX:no exudate, no erythema and lips, buccal mucosa, and tongue normal  NECK: supple, thyroid normal size, non-tender, without nodularity LYMPH:  no palpable lymphadenopathy in the cervical, axillary or inguinal LUNGS: clear to auscultation and percussion with normal breathing effort HEART: regular rate & rhythm and no murmurs and no lower extremity edema ABDOMEN:abdomen soft, non-tender and normal bowel sounds Musculoskeletal:no cyanosis of digits and no clubbing  NEURO: alert & oriented x 3 with fluent speech, no focal motor/sensory deficits  LABORATORY DATA:  I have reviewed the data as listed    Component Value Date/Time   NA 139 02/05/2019 1020   NA 141 05/17/2017 0957   K 3.9 02/05/2019 1020   K 3.6 05/17/2017 0957   CL 101 02/05/2019 1020   CL 105 11/19/2012 0848   CO2 28 02/05/2019 1020   CO2 25 05/17/2017 0957   GLUCOSE 98 02/05/2019 1020   GLUCOSE 85 05/17/2017 0957   GLUCOSE 122 (H) 11/19/2012 0848   BUN 20 02/05/2019 1020   BUN 15.2 05/17/2017 0957   CREATININE 0.81 02/05/2019 1020   CREATININE 0.8 05/17/2017 0957   CALCIUM 9.4 02/05/2019 1020   CALCIUM 9.6 05/17/2017 0957   PROT 7.1 02/05/2019 1020   PROT 7.0 05/17/2017 0957   ALBUMIN 3.7 02/05/2019 1020   ALBUMIN 3.9 05/17/2017 0957   AST 24 02/05/2019 1020   AST 25 05/17/2017 0957   ALT 21 02/05/2019 1020   ALT 18 05/17/2017 0957   ALKPHOS 69 02/05/2019 1020   ALKPHOS 57 05/17/2017 0957   BILITOT 0.3 02/05/2019 1020   BILITOT 0.47 05/17/2017 0957   GFRNONAA >60 02/05/2019 1020   GFRAA >60 02/05/2019 1020    No results found for: SPEP, UPEP  Lab Results  Component Value Date   WBC 3.8 (L) 02/27/2019   NEUTROABS 2.5 02/27/2019    HGB 11.6 (L) 02/27/2019   HCT 34.4 (L) 02/27/2019   MCV 97.2 02/27/2019   PLT 172 02/27/2019      Chemistry      Component Value Date/Time   NA 139 02/05/2019 1020   NA 141 05/17/2017 0957   K 3.9 02/05/2019 1020   K 3.6 05/17/2017 0957   CL 101 02/05/2019 1020   CL 105 11/19/2012 0848   CO2 28 02/05/2019 1020   CO2 25 05/17/2017 0957   BUN 20 02/05/2019 1020   BUN 15.2 05/17/2017 0957   CREATININE 0.81 02/05/2019 1020   CREATININE 0.8 05/17/2017 0957   GLU 158 (H) 01/14/2015 1035      Component Value Date/Time   CALCIUM 9.4 02/05/2019 1020   CALCIUM 9.6 05/17/2017 0957   ALKPHOS 69 02/05/2019 1020   ALKPHOS 57 05/17/2017 0957   AST 24 02/05/2019 1020   AST 25 05/17/2017 0957   ALT 21 02/05/2019 1020   ALT 18 05/17/2017 0957   BILITOT 0.3 02/05/2019 1020   BILITOT 0.47 05/17/2017 0957       RADIOGRAPHIC STUDIES: I have personally reviewed the radiological images as listed and agreed with the findings in the report. Nm Pet Image Restag (ps) Skull Base To Thigh  Result Date: 02/04/2019 CLINICAL DATA:  Sub treatment strategy for endometrial carcinoma. Immunotherapy ongoing. EXAM: NUCLEAR MEDICINE  PET SKULL BASE TO THIGH TECHNIQUE: 7.93 mCi F-18 FDG was injected intravenously. Full-ring PET imaging was performed from the skull base to thigh after the radiotracer. CT data was obtained and used for attenuation correction and anatomic localization. Fasting blood glucose: 94 mg/dl COMPARISON:  PET-CT 10/31/2018, 07/30/2018, 04/16/2018 FINDINGS: Mediastinal blood pool activity: SUV max 2.1 Liver activity: SUV max NA NECK: Persistent intense hypermetabolic activity associated with the RIGHT thyroid nodule similar to comparison exam. Adjacent hypermetabolic RIGHT supraclavicular node with SUV max equal 5.7 is decreased from SUV max equal 11.5. Incidental CT findings: None CHEST: RIGHT paratracheal lymph node is decrease in metabolic activity SUV max equal 3.3 compared with SUV max  equal 7.2. Long segment of metabolic activity associated with the esophagus without focal lesion on CT. No change from prior. Incidental CT findings: No suspicious pulmonary nodules ABDOMEN/PELVIS: No abnormal activity liver. Scattered activity associated with colon is similar to comparison exam. No focal thickening identified on the CT portion exam. No peritoneal or retroperitoneal metastasis identified. No pelvic sidewall mass.  Post hysterectomy anatomy. No retroperitoneal hypermetabolic lymph nodes. The pelvic lymph nodes. Incidental CT findings: none SKELETON: No focal hypermetabolic activity to suggest skeletal metastasis. Incidental CT findings: none IMPRESSION: 1. Decrease in metabolic activity of RIGHT supraclavicular node and RIGHT paratracheal lymph nodes. 2. Persistent activity in the RIGHT thyroid nodule unchanged. 3. Diffuse activity through the esophagus is favored esophagitis. No change. 4. Scattered foci of intense metabolic activity associated the bowel without focal lesion on CT favored benign. 5. No evidence of peritoneal metastasis. 6. No evidence of metastatic adenopathy in the abdomen pelvis. 7. No evidence of local pelvic sidewall recurrence. Electronically Signed   By: Suzy Bouchard M.D.   On: 02/04/2019 12:41    All questions were answered. The patient knows to call the clinic with any problems, questions or concerns. No barriers to learning was detected.  I spent 15 minutes counseling the patient face to face. The total time spent in the appointment was 20 minutes and more than 50% was on counseling and review of test results  [name], MD 02/27/2019 11:27 AM

## 2019-02-27 NOTE — Assessment & Plan Note (Signed)
Recent PET scan showed positive response to treatment She has intermittent proteinuria secondary to [facility], of which the dose has already been reduced We will proceed with pembrolizumab The plan would be to continue treatment indefinitely I do not plan to repeat another imaging study for another few months, due next around March 2021

## 2019-02-27 NOTE — Assessment & Plan Note (Signed)
She has intermittent proteinuria even though her blood pressure remains well controlled She will continue aggressive blood pressure management 

## 2019-03-12 ENCOUNTER — Other Ambulatory Visit: Payer: Self-pay | Admitting: Hematology and Oncology

## 2019-03-12 MED FILL — SIMVASTATIN 20 MG TABLET: 20 | 90 days supply | Qty: 90 | Fill #0

## 2019-03-13 MED FILL — HYDROCHLOROTHIAZIDE 50 MG T: 50 | 90 days supply | Qty: 90 | Fill #0

## 2019-03-17 MED FILL — LENVIMA 10 MG DAILY DOSE: 10 | 30 days supply | Qty: 30 | Fill #4

## 2019-03-18 DIAGNOSIS — C541 Malignant neoplasm of endometrium: Secondary | ICD-10-CM | POA: Diagnosis not present

## 2019-03-18 DIAGNOSIS — F411 Generalized anxiety disorder: Secondary | ICD-10-CM | POA: Diagnosis not present

## 2019-03-18 DIAGNOSIS — I10 Essential (primary) hypertension: Secondary | ICD-10-CM | POA: Diagnosis not present

## 2019-03-18 DIAGNOSIS — E7800 Pure hypercholesterolemia, unspecified: Secondary | ICD-10-CM | POA: Diagnosis not present

## 2019-03-20 ENCOUNTER — Inpatient Hospital Stay: Payer: 59

## 2019-03-20 ENCOUNTER — Other Ambulatory Visit: Payer: Self-pay

## 2019-03-20 ENCOUNTER — Telehealth: Payer: Self-pay | Admitting: Hematology and Oncology

## 2019-03-20 ENCOUNTER — Inpatient Hospital Stay (HOSPITAL_BASED_OUTPATIENT_CLINIC_OR_DEPARTMENT_OTHER): Payer: 59 | Admitting: Hematology and Oncology

## 2019-03-20 ENCOUNTER — Inpatient Hospital Stay: Payer: 59 | Attending: Hematology and Oncology

## 2019-03-20 ENCOUNTER — Telehealth: Payer: Self-pay

## 2019-03-20 ENCOUNTER — Encounter: Payer: Self-pay | Admitting: Hematology and Oncology

## 2019-03-20 DIAGNOSIS — R946 Abnormal results of thyroid function studies: Secondary | ICD-10-CM | POA: Insufficient documentation

## 2019-03-20 DIAGNOSIS — C541 Malignant neoplasm of endometrium: Secondary | ICD-10-CM

## 2019-03-20 DIAGNOSIS — Z9079 Acquired absence of other genital organ(s): Secondary | ICD-10-CM | POA: Insufficient documentation

## 2019-03-20 DIAGNOSIS — Z9221 Personal history of antineoplastic chemotherapy: Secondary | ICD-10-CM | POA: Insufficient documentation

## 2019-03-20 DIAGNOSIS — I10 Essential (primary) hypertension: Secondary | ICD-10-CM | POA: Diagnosis not present

## 2019-03-20 DIAGNOSIS — C770 Secondary and unspecified malignant neoplasm of lymph nodes of head, face and neck: Secondary | ICD-10-CM | POA: Insufficient documentation

## 2019-03-20 DIAGNOSIS — Z95828 Presence of other vascular implants and grafts: Secondary | ICD-10-CM

## 2019-03-20 DIAGNOSIS — Z90710 Acquired absence of both cervix and uterus: Secondary | ICD-10-CM | POA: Diagnosis not present

## 2019-03-20 DIAGNOSIS — Z5112 Encounter for antineoplastic immunotherapy: Secondary | ICD-10-CM | POA: Diagnosis not present

## 2019-03-20 DIAGNOSIS — Z79899 Other long term (current) drug therapy: Secondary | ICD-10-CM | POA: Insufficient documentation

## 2019-03-20 DIAGNOSIS — D61818 Other pancytopenia: Secondary | ICD-10-CM | POA: Diagnosis not present

## 2019-03-20 DIAGNOSIS — Z923 Personal history of irradiation: Secondary | ICD-10-CM | POA: Diagnosis not present

## 2019-03-20 DIAGNOSIS — E039 Hypothyroidism, unspecified: Secondary | ICD-10-CM | POA: Insufficient documentation

## 2019-03-20 DIAGNOSIS — Z90722 Acquired absence of ovaries, bilateral: Secondary | ICD-10-CM | POA: Insufficient documentation

## 2019-03-20 DIAGNOSIS — R809 Proteinuria, unspecified: Secondary | ICD-10-CM | POA: Insufficient documentation

## 2019-03-20 DIAGNOSIS — Z7189 Other specified counseling: Secondary | ICD-10-CM

## 2019-03-20 LAB — COMPREHENSIVE METABOLIC PANEL
ALT: 16 U/L (ref 0–44)
AST: 22 U/L (ref 15–41)
Albumin: 3.8 g/dL (ref 3.5–5.0)
Alkaline Phosphatase: 57 U/L (ref 38–126)
Anion gap: 10 (ref 5–15)
BUN: 27 mg/dL — ABNORMAL HIGH (ref 8–23)
CO2: 28 mmol/L (ref 22–32)
Calcium: 9.7 mg/dL (ref 8.9–10.3)
Chloride: 102 mmol/L (ref 98–111)
Creatinine, Ser: 0.88 mg/dL (ref 0.44–1.00)
GFR calc Af Amer: >60 mL/min (ref >60)
GFR calc non Af Amer: >60 mL/min (ref >60)
Glucose, Bld: 101 mg/dL — ABNORMAL HIGH (ref 70–99)
Potassium: 3.8 mmol/L (ref 3.5–5.1)
Sodium: 140 mmol/L (ref 135–145)
Total Bilirubin: 0.4 mg/dL (ref 0.3–1.2)
Total Protein: 7.3 g/dL (ref 6.5–8.1)

## 2019-03-20 LAB — CBC WITH DIFFERENTIAL/PLATELET
Abs Immature Granulocytes: 0 10*3/uL (ref 0.00–0.07)
Basophils Absolute: 0 10*3/uL (ref 0.0–0.1)
Basophils Relative: 1 %
Eosinophils Absolute: 0.3 10*3/uL (ref 0.0–0.5)
Eosinophils Relative: 8 %
HCT: 32.3 % — ABNORMAL LOW (ref 36.0–46.0)
Hemoglobin: 10.8 g/dL — ABNORMAL LOW (ref 12.0–15.0)
Immature Granulocytes: 0 %
Lymphocytes Relative: 19 %
Lymphs Abs: 0.7 10*3/uL (ref 0.7–4.0)
MCH: 32.4 pg (ref 26.0–34.0)
MCHC: 33.4 g/dL (ref 30.0–36.0)
MCV: 97 fL (ref 80.0–100.0)
Monocytes Absolute: 0.3 10*3/uL (ref 0.1–1.0)
Monocytes Relative: 8 %
Neutro Abs: 2.3 10*3/uL (ref 1.7–7.7)
Neutrophils Relative %: 65 %
Platelets: 178 10*3/uL (ref 150–400)
RBC: 3.33 MIL/uL — ABNORMAL LOW (ref 3.87–5.11)
RDW: 12.9 % (ref 11.5–15.5)
WBC: 3.5 10*3/uL — ABNORMAL LOW (ref 4.0–10.5)
nRBC: 0 % (ref 0.0–0.2)

## 2019-03-20 LAB — TSH: TSH: 5.024 u[IU]/mL — ABNORMAL HIGH (ref 0.308–3.960)

## 2019-03-20 LAB — TOTAL PROTEIN, URINE DIPSTICK: Protein, ur: 30 mg/dL — AB

## 2019-03-20 MED ORDER — LEVOTHYROXINE SODIUM 75 MCG PO TABS: 75.0000 ug | ORAL_TABLET | Freq: Every day | ORAL | 2 refills | Status: DC

## 2019-03-20 MED ORDER — SODIUM CHLORIDE 0.9% FLUSH
10.0000 mL | INTRAVENOUS | Status: DC | PRN
  Administered 2019-03-20: 10 mL
  Filled 2019-03-20: qty 10

## 2019-03-20 MED ORDER — HEPARIN SOD (PORK) LOCK FLUSH 100 UNIT/ML IV SOLN
500.0000 [IU] | Freq: Once | INTRAVENOUS | Status: AC | PRN
  Administered 2019-03-20: 500 [IU]
  Filled 2019-03-20: qty 5

## 2019-03-20 MED ORDER — SODIUM CHLORIDE 0.9 % IV SOLN
Freq: Once | INTRAVENOUS | Status: AC
  Administered 2019-03-20: 10:00:00 via INTRAVENOUS
  Filled 2019-03-20: qty 250

## 2019-03-20 MED ORDER — SODIUM CHLORIDE 0.9% FLUSH
10.0000 mL | Freq: Once | INTRAVENOUS | Status: AC
  Administered 2019-03-20: 10 mL
  Filled 2019-03-20: qty 10

## 2019-03-20 MED ORDER — SODIUM CHLORIDE 0.9 % IV SOLN
200.0000 mg | Freq: Once | INTRAVENOUS | Status: AC
  Administered 2019-03-20: 200 mg via INTRAVENOUS
  Filled 2019-03-20: qty 8

## 2019-03-20 MED FILL — LEVOTHYROXINE 75 MCG TABLET: 75 | 30 days supply | Qty: 30 | Fill #0

## 2019-03-20 NOTE — Telephone Encounter (Signed)
Called and given below message. She verbalized understanding. She is taking it consistently. Rx sent to pharmacy.

## 2019-03-20 NOTE — Assessment & Plan Note (Signed)
Recent PET scan showed positive response to treatment She has intermittent proteinuria secondary to [facility], of which the dose has already been reduced We will proceed with pembrolizumab The plan would be to continue treatment indefinitely I do not plan to repeat another imaging study for another few months, due next around March 2021

## 2019-03-20 NOTE — Telephone Encounter (Signed)
I talk with patient regarding schedule  

## 2019-03-20 NOTE — Assessment & Plan Note (Signed)
She has intermittent elevated TSH I will adjust her thyroid medicine accordingly 

## 2019-03-20 NOTE — Progress Notes (Signed)
[facility] OFFICE PROGRESS NOTE  Patient Care Team: [name], MD as PCP - General (Family Medicine) Reynold Bowen, MD as Consulting Physician (Endocrinology)  ASSESSMENT & PLAN:  Endometrial cancer [facility]) Recent PET scan showed positive response to treatment She has intermittent proteinuria secondary to [facility], of which the dose has already been reduced We will proceed with pembrolizumab The plan would be to continue treatment indefinitely I do not plan to repeat another imaging study for another few months, due next around March 2021  Pancytopenia, acquired [facility]) This is due to side effects of treatment She is not symptomatic Observe only  Acquired hypothyroidism She has intermittent elevated TSH I will adjust her thyroid medicine accordingly  Essential hypertension She has intermittent proteinuria even though her blood pressure remains well controlled She will continue aggressive blood pressure management   No orders of the defined types were placed in this encounter.   INTERVAL HISTORY: Please see below for problem oriented charting. She returns for further follow-up She tolerated treatment well Her blood pressure at home is satisfactory She denies recent infection, fever or chills  SUMMARY OF ONCOLOGIC HISTORY: Oncology History Overview Note  [employer] testing done 11-2015 on surgical path from 2014: MS stable TMB low 4 muts/mb ATM [id number] ERBB3 T389K E2H2 rearrangement exon 9 PPP2R1A P179R TP53 I195N    ER- APPROXIMATELY 25-35% STAINING IN NEOPLASTIC CELLS (INTERMEDIATE)  PR- APPROXIMATELY 25-35% STAINING IN NEOPLASTIC CELLS (STRONG)  Repeat biopsy 04/02/17: ER negative, Her 2 negative  Progressed on Doxil   Endometrial cancer ([place])  06/20/2012 Pathology Results   Biopsy positive for papillary serous carcinoma   06/20/2012 Genetic Testing   [employer] One testing done 11-2015 on surgical path from 2014: MS  stable TMB low 4 muts/mb ATM [id number] ERBB3 T389K E2H2 rearrangement exon 9 PPP2R1A P179R TP53 I195N    06/20/2012 Initial Diagnosis   Patient presented to PCP with intermittent vaginal bleeding since ~ Oct 2013, endometrial biopsy 06-20-12 with complex endometrial hyperplasia with atypia   06/26/2012 Imaging   Thickened endometrial lining in a postmenopausal patient experiencing vaginal bleeding. In the setting of post-menopausal bleeding, endometrial sampling is indicated to exclude carcinoma. No focal myometrial abnormalities are seen.  Normal left ovary and non-visualized right ovary   07/30/2012 Surgery   Dr. Polly Cobia performed robotic hysterectomy with bilateral salpingo-oophorectomy, bilateral pelvic lymph node dissection and periaortic lymph node dissection. Intraoperatively on frozen section, the patient was noted to have a large endometrial polyp with changes within the polyp consistent for high-grade malignancy, possibly papillary serous carcinoma. There is no obvious extrauterine disease noted.     07/30/2012 Pathology Results   [phone] SUPPLEMENTAL REPORT  THE ENDOMETRIAL CARCINOMA WAS ANALYZED FOR DNA MISMATCH REPAIR PROTEINS.IMMUNOHISTOCHEMICALLY, THE NEOPLASM RETAINED NUCLEAR EXPRESSION OF 4 GENE PRODUCTS, MLH1, MSH2, MSH6, AND PMS2, INVOLVED IN DNA MISMATCH REPAIR.POSITIVE AND NEGATIVE CONTROLS WORKED APPROPRIATELY.  PER REQUEST, AN ER AND PR ARE PERFORMED ON BLOCK 1I.  ER- APPROXIMATELY 25-35% STAINING IN NEOPLASTIC CELLS (INTERMEDIATE)  PR- APPROXIMATELY 25-35% STAINING IN NEOPLASTIC CELLS (STRONG)  ER AND PR PREDOMINANTLY SHOW STAINING IN THE SEROUS COMPONENT.  DIAGNOSIS  1. UTERUS, CERVIX WITH BILATERAL FALLOPIAN TUBES AND OVARIES,  HYSTERECTOMY AND BILATERAL SALPINGO-OOPHORECTOMY:  HIGH GRADE/POORLY DIFFERENTIATED ENDOMETRIAL ADENOCARCINOMA WITH SOLID AND SEROUS PAPILLARY COMPONENTS.  HISTOPATHOLOGIC TYPE:A VARIETY OF PATTERNS WERE PRESENT,  ALL OF WHICH SHOULD BE CONSIDERED TO BE HIGH GRADE.SEROUS PAPILLARY CARCINOMA WAS SEEN OCCURRING ADJACENT TO SOLID ENDOMETRIAL CARCINOMA WITH MARKED ANAPLASIA AND GIANT CELLS. SIZE:TUMOR MEASURED AT  LEAST 4.8 CM IN GREATEST HORIZONTAL DIMENSION.  GRADE: POORLY DIFFERENTIATED OR HIGH GRADE. DEPTH OF INVASION: NO DEFINITE MYOMETRIAL INVOLVEMENT WAS SEEN.THE TUMOR APPEARED CONFINED TO THE POLYP AS WELL AS SURFACE ENDOMETRIUM. WHERE MYOMETRIAL THICKNESS NOT APPLICABLE. SEROSAL INVOLVEMENT: NOT DEMONSTRATED.  ENDOCERVICAL INVOLVEMENT:NOT DEMONSTRATED. RESECTION MARGINS: FREE OF INVOLVEMENT. EXTRAUTERINE EXTENSION:NOT DEMONSTRATED. ANGIOLYMPHATIC INVASION: NOT DEFINITELY SEEN. TOTAL NODES EXAMINED:22.  PELVIC NODES EXAMINED:20.  PELVIC NODES INVOLVED:0.  PARA-AORTIC NODES 2. EXAMINED:  PARA-AORTIC NODES 0. INVOLVED TNM STAGE: T1A N0 MX. AJCC STAGE GROUPING: IA. FIGO STAGE:IA.   08/26/2012 Imaging   No CT evidence for intra-abdominal or pelvic metastatic disease. Trace free pelvic fluid, presumably postoperative although the date of surgery is not documented in the electronic medical record.   08/26/2012 - 12/13/2012 Chemotherapy   She received 6 cycles of carbo/taxol    10/29/2012 - 11/27/2012 Radiation Therapy   May 27, June 5,  June 11, June 19, November 27, 2012: Proximal vagina 30 Gy in 5 fractions     01/17/2013 Imaging   1.  No evidence of recurrent or metastatic disease. 2.  No acute abnormality involving the abdomen or pelvis. 3.  Mild diffuse hepatic steatosis. 4.  Very small supraumbilical midline anterior abdominal wall hernia containing fat, unchanged   01/22/2014 Imaging   No evidence for metastatic or recurrent disease. 2. No bowel obstruction.  Normal appendix. 3. Small fat containing hernia, stable in appearance. 4. Status post hysterectomy and bilateral oophorectomy   02/19/2014  Imaging   No pulmonary lesions are identified. The abnormality on the chest x-ray is due to asymmetric left-sided sternoclavicular joint degenerative disease   10/12/2014 Imaging   New mild retroperitoneal lymphadenopathy in the left paraaortic region and proximal left common iliac chain, consistent with metastatic disease. No other sites of metastatic disease identified within the abdomen or pelvis.     11/27/2014 - 02/11/2015 Chemotherapy   She received 4 cycles of carbo/taxol    03/01/2015 PET scan   Single hypermetabolic small retroperitoneal lymph node along the aorta. 2. No evidence of metastatic disease otherwise in the abdomen or pelvis. No evidence local recurrence. 3. Intensely hypermetabolic enlarged nodule adjacent to the RIGHT lobe of thyroid gland. This presumably represents the biopsied lesion in clinician report which was found to be benign thyroid tissue.   04/05/2015 - 05/13/2015 Radiation Therapy   She received 50.4 gray in 28 fractions with simultaneous integrated boost to 56 gray   06/17/2015 Imaging   No acute process or evidence of metastatic disease in the abdomen or pelvis. Resolution of previously described retroperitoneal adenopathy. 2.  Possible constipation. 3. Atherosclerosis.   06/17/2015 Tumor Marker   Patient's tumor was tested for the following markers: CA125 Results of the tumor marker test revealed 33   08/12/2015 Tumor Marker   Patient's tumor was tested for the following markers: CA125 Results of the tumor marker test revealed 52   08/31/2015 PET scan   Development of right paratracheal hypermetabolic adenopathy, consistent with nodal metastasis. 2. The previously described isolated abdominal retroperitoneal hypermetabolic node has resolved. 3. Persistent hypermetabolic right thyroid nodule, per report previously biopsied. Correlate with those results. 4.  Possible constipation.   09/09/2015 -  Anti-estrogen oral therapy   She has been receiving alternative  treatment between megace and tamoxifen   09/09/2015 Tumor Marker   Patient's tumor was tested for the following markers: CA125 Results of the tumor marker test revealed 37.8   09/20/2015 Procedure   Technically successful ultrasound-guided thyroid aspiration biopsy , dominant  right nodule   09/20/2015 Pathology Results   THYROID, RIGHT, FINE NEEDLE ASPIRATION (SPECIMEN 1 OF 1 COLLECTED 09/20/15): FINDINGS CONSISTENT WITH BENIGN THYROID NODULE (BETHESDA CATEGORY II).   10/28/2015 Tumor Marker   Patient's tumor was tested for the following markers: CA125 Results of the tumor marker test revealed 53.2   11/04/2015 Imaging   Stable benign right thyroid nodule   12/16/2015 Tumor Marker   Patient's tumor was tested for the following markers: CA125 Results of the tumor marker test revealed 38.1   03/22/2016 PET scan   Interval progression of hypermetabolic right paratracheal lymph node consistent with metastatic involvement. 2. Stable hypermetabolic right thyroid nodule. Reportedly this has been biopsied in the past.   03/23/2016 Tumor Marker   Patient's tumor was tested for the following markers: CA125 Results of the tumor marker test revealed 62.4   04/13/2016 - 06/01/2016 Radiation Therapy   She received 56 Gy to the chest in 28 fractions    05/09/2016 Imaging   No evidence of left lower extremity deep vein thrombosis. No evidence of a superficial thrombosis of the greater and lesser saphenous veins. Positive for thrombus noted in several varicosities of the calf. No evidence of Baker's cyst on the left.   05/17/2016 Tumor Marker   Patient's tumor was tested for the following markers: CA125 Results of the tumor marker test revealed 9   06/29/2016 Tumor Marker   Patient's tumor was tested for the following markers: CA125 Results of the tumor marker test revealed 5.9   08/04/2016 Tumor Marker   Patient's tumor was tested for the following markers: CA125 Results of the tumor marker test  revealed 6.0   09/12/2016 PET scan   Complete metabolic response to therapy, with resolution of hypermetabolic mediastinal lymphadenopathy since prior exam. No residual or new metastatic disease identified. Stable hypermetabolic right thyroid lobe nodule, which was previously biopsied on 09/20/2015.   03/13/2017 PET scan   1. Solitary focus of recurrent right paratracheal hypermetabolic activity, with a 1.0 cm right lower paratracheal node having a maximum SUV of 9.0. Appearance compatible with recurrent malignancy. 2. Continued hypermetabolic right thyroid nodule, previously biopsied, presumed benign -correlate with prior biopsy results. 3. Other imaging findings of potential clinical significance: Aortic Atherosclerosis (ICD10-I70.0). Mild cardiomegaly. Prominent stool throughout the colon favors constipation.   04/02/2017 Pathology Results   FINE NEEDLE ASPIRATION, ENDOSCOPIC, (EBUS) 4 R NODE (SPECIMEN 1 OF 2 COLLECTED 04/02/17): MALIGNANT CELLS PRESENT, CONSISTENT WITH CARCINOMA. SEE COMMENT. COMMENT: THE MALIGNANT CELLS ARE POSITIVE FOR P53 AND NEGATIVE FOR ESTROGEN RECEPTOR AND TTF-1. THIS PROFILE IS NON-SPECIFIC, BUT THE P53 POSITIVE STAINING IS SUGGESTIVE OF GYNECOLOGIC PRIMARY.    04/11/2017 Procedure   Successful 8 French right internal jugular vein power port placement with its tip at the SVC/RA junction   04/12/2017 Imaging   Normal LV size with mild LV hypertrophy. EF 55-60%. Normal RV size and systolic function. Aortic valve sclerosis without significant stenosis.   04/18/2017 - 06/14/2017 Chemotherapy   The patient had chemotherapy with Doxil    07/10/2017 Imaging   - Left ventricle: The cavity size was normal. There was mild concentric hypertrophy. Systolic function was normal. The estimated ejection fraction was in the range of 55% to 60%. Wall motion was normal; there were no regional wall motion abnormalities. Doppler parameters are consistent with abnormal left ventricular  relaxation (grade 1 diastolic dysfunction). - Aortic valve: Noncoronary cusp mobility was mildly restricted. - Mitral valve: There was mild regurgitation. - Left atrium: The atrium was  mildly dilated. - Atrial septum: No defect or patent foramen ovale was identified.  Impressions:  - Compared to November 2018, global LV longitudinal strain remains normal (has increased)   07/11/2017 PET scan   Increased size and hypermetabolic activity of 3.3 cm right thyroid lobe nodule. Thyroid carcinoma cannot be excluded. Recommend repeat ultrasound guided fine needle aspiration to exclude thyroid carcinoma.  New adjacent hypermetabolic 10 mm right supraclavicular lymph node, suspicious for lymph node metastasis.  Slight increase in size and hypermetabolic activity of solitary right paratracheal lymph node.  No evidence of abdominal or pelvic metastatic disease.   07/18/2017 Procedure   1. Technically successful ultrasound guided fine needle aspiration of indeterminate hypermetabolic right-sided thyroid nodule/mass. 2. Technically successful ultrasound-guided core needle biopsy of hypermetabolic right lower cervical lymph node.   07/18/2017 Pathology Results   THYROID, FINE NEEDLE ASPIRATION RIGHT (SPECIMEN 1 OF 1, COLLECTED ON 07/18/2017): ATYPIA OF UNDETERMINED SIGNIFICANCE OR FOLLICULAR LESION OF UNDETERMINED SIGNIFICANCE (BETHESDA CATEGORY III). SEE COMMENT. COMMENT: THE SPECIMEN CONSISTS OF SMALL AND MEDIUM SIZED GROUPS OF FOLLICULAR EPITHELIAL CELLS WITH MILD CYTOLOGIC ATYPIA INCLUDING NUCLEAR ENLARGEMENT AND HURTHLE CELL CHANGE. SOME GROUPS ARE ARRANGED AS MICROFOLLICLES. THERE IS MINIMAL BACKGROUND COLLOID. BASED ON THESE FEATURES, A FOLLICULAR LESION/NEOPLASM CAN NOT BE ENTIRELY RULED OUT. A SPECIMEN WILL BE SENT FOR AFIRMA TESTING.   07/19/2017 Pathology Results   Lymph node, needle/core biopsy - METASTATIC PAPILLARY SEROUS CARCINOMA. - SEE COMMENT. Microscopic Comment Dr. Vicente Males has  reviewed the case and concurs with this interpretation   10/15/2017 PET scan   No new or progressive disease. No evidence of abdominal or pelvic metastatic disease.  Stable hypermetabolic right thyroid lobe nodule and adjacent right supraclavicular lymph node.  Decreased size and hypermetabolic activity of solitary right paratracheal lymph node.   01/23/2018 PET scan   1. Overall, no significant change from PET-CT of 3 months ago. There is persistent hypermetabolic activity at the right thoracic inlet and within a right paratracheal mediastinal node. The nodes have not significantly changed in size, although the metabolic activity has minimally increased over this interval. It is uncertain how much of the thoracic inlet metabolic activity is attributed to the thyroid nodule versus adjacent cervical lymph nodes, both previously biopsied. 2. No new hypermetabolic activity within the neck, chest, abdomen or pelvis. No evidence of local recurrence in the pelvis. 3. Stable probable radiation changes in the right lung.   04/16/2018 PET scan   1. Interval increase in metabolic activity of the RIGHT supraclavicular node and RIGHT lower paratracheal mediastinal node with minimal change in size. Findings consistent with persistent and mildly progressed metastatic adenopathy. 2. New hypermetabolic small lymph node in the RIGHT lower paratracheal nodal station adjacent to the previously followed node. 3. No evidence of local recurrence in the pelvis or new disease elsewhere.   07/30/2018 PET scan   1. Mild interval progression of right supraclavicular, mediastinal, and right hilar hypermetabolic metastatic disease. There is a new hypermetabolic lymph node in the subcarinal station on today's study. 2. No hypermetabolic disease in the abdomen or pelvis to suggest recurrent/metastases. 3.  Aortic Atherosclerois (ICD10-170.0)    08/16/2018 -  Chemotherapy   The patient had Lenvima since 3/6 and  pembrolizumab since 3/13 for chemotherapy treatment. Michel Santee was held temporarily due to infection and severe hypertension   10/31/2018 PET scan   Partial response to therapy, with decreased hypermetabolic lymphadenopathy in mediastinum and right hilar region.  No significant change in hypermetabolic lymphadenopathy in right inferior neck.  No new or progressive metastatic disease identified. No evidence of recurrent or metastatic carcinoma in the pelvis or abdomen   02/04/2019 PET scan   1. Decrease in metabolic activity of RIGHT supraclavicular node and RIGHT paratracheal lymph nodes. 2. Persistent activity in the RIGHT thyroid nodule unchanged. 3. Diffuse activity through the esophagus is favored esophagitis. No change. 4. Scattered foci of intense metabolic activity associated the bowel without focal lesion on CT favored benign. 5. No evidence of peritoneal metastasis. 6. No evidence of metastatic adenopathy in the abdomen pelvis. 7. No evidence of local pelvic sidewall recurrence.   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      REVIEW OF SYSTEMS:   Constitutional: Denies fevers, chills or abnormal weight loss Eyes: Denies blurriness of vision Ears, nose, mouth, throat, and face: Denies mucositis or sore throat Respiratory: Denies cough, dyspnea or wheezes Cardiovascular: Denies palpitation, chest discomfort or lower extremity swelling Gastrointestinal:  Denies nausea, heartburn or change in bowel habits Skin: Denies abnormal skin  rashes Lymphatics: Denies new lymphadenopathy or easy bruising Neurological:Denies numbness, tingling or new weaknesses Behavioral/Psych: Mood is stable, no new changes  All other systems were reviewed with the patient and are negative.  I have reviewed the past medical history, past surgical history, social history and family history with the patient and they are unchanged from previous note.  ALLERGIES:  has No Known Allergies.  MEDICATIONS:  Current Outpatient Medications  Medication Sig Dispense Refill  . amLODipine (NORVASC) 10 MG tablet TAKE 1 TABLET BY MOUTH DAILY. DECREASE SIMVASTATIN TO 20MG DAILY WHILE ON AMLODIPINE PER MD 90 tablet 1  . Calcium Carb-Cholecalciferol 500-600 MG-UNIT TABS Take 1 tablet by mouth daily.     . Cholecalciferol (VITAMIN D3) 2000 units TABS Take 2,000 Units by mouth daily.     . hydrochlorothiazide (HYDRODIURIL) 50 MG tablet TAKE 1 TABLET (50 MG TOTAL) BY MOUTH DAILY. 90 tablet 11  . lenvatinib 10 mg daily dose (LENVIMA, 10 MG DAILY DOSE,) capsule Take 1 capsule (10 mg total) by mouth daily. 30 capsule 11  . levothyroxine (SYNTHROID) 50 MCG tablet TAKE 1 TABLET (50 MCG TOTAL) BY MOUTH DAILY BEFORE BREAKFAST. 90 tablet 3  . LORazepam (ATIVAN) 0.5 MG tablet Take 0.5 mg by mouth 2 (two) times daily as needed.  0  . losartan (COZAAR) 50 MG tablet Take 1 tablet (50 mg total) by mouth daily. 90 tablet 11  . metoprolol tartrate (LOPRESSOR) 25 MG tablet Take 1 tablet (25 mg total) by mouth 2 (two) times daily. 180 tablet 3  . simvastatin (ZOCOR) 40 MG tablet Take 40 mg by mouth every evening.    . tacrolimus (PROTOPIC) 0.1 % ointment Apply 1 application topically daily as needed (rash).     . venlafaxine XR (EFFEXOR-XR) 75 MG 24 hr capsule Take 75 mg by mouth daily with breakfast.      No current facility-administered medications for this visit.    Facility-Administered Medications Ordered in Other Visits  Medication Dose Route Frequency Provider Last Rate  Last Dose  . sodium chloride flush (NS) 0.9 %  injection 10 mL  10 mL Intracatheter PRN [name], MD   10 mL at 03/20/19 1121    PHYSICAL EXAMINATION: ECOG PERFORMANCE STATUS: 1 - Symptomatic but completely ambulatory  Vitals:   03/20/19 0934  BP: 126/64  Pulse: 65  Resp: 18  Temp: 99.1 F (37.3 C)  SpO2: 94%   Filed Weights   03/20/19 0934  Weight: 159 lb 3.2 oz (72.2 kg)    GENERAL:alert, no distress and comfortable SKIN: skin color, texture, turgor are normal, no rashes or significant lesions EYES: normal, Conjunctiva are pink and non-injected, sclera clear OROPHARYNX:no exudate, no erythema and lips, buccal mucosa, and tongue normal  NECK: supple, thyroid normal size, non-tender, without nodularity LYMPH:  no palpable lymphadenopathy in the cervical, axillary or inguinal LUNGS: clear to auscultation and percussion with normal breathing effort HEART: regular rate & rhythm and no murmurs and no lower extremity edema ABDOMEN:abdomen soft, non-tender and normal bowel sounds Musculoskeletal:no cyanosis of digits and no clubbing  NEURO: alert & oriented x 3 with fluent speech, no focal motor/sensory deficits  LABORATORY DATA:  I have reviewed the data as listed    Component Value Date/Time   NA 140 03/20/2019 0835   NA 141 05/17/2017 0957   K 3.8 03/20/2019 0835   K 3.6 05/17/2017 0957   CL 102 03/20/2019 0835   CL 105 11/19/2012 0848   CO2 28 03/20/2019 0835   CO2 25 05/17/2017 0957   GLUCOSE 101 (H) 03/20/2019 0835   GLUCOSE 85 05/17/2017 0957   GLUCOSE 122 (H) 11/19/2012 0848   BUN 27 (H) 03/20/2019 0835   BUN 15.2 05/17/2017 0957   CREATININE 0.88 03/20/2019 0835   CREATININE 0.8 05/17/2017 0957   CALCIUM 9.7 03/20/2019 0835   CALCIUM 9.6 05/17/2017 0957   PROT 7.3 03/20/2019 0835   PROT 7.0 05/17/2017 0957   ALBUMIN 3.8 03/20/2019 0835   ALBUMIN 3.9 05/17/2017 0957   AST 22 03/20/2019 0835   AST 25 05/17/2017 0957   ALT 16 03/20/2019 0835   ALT 18  05/17/2017 0957   ALKPHOS 57 03/20/2019 0835   ALKPHOS 57 05/17/2017 0957   BILITOT 0.4 03/20/2019 0835   BILITOT 0.47 05/17/2017 0957   GFRNONAA >60 03/20/2019 0835   GFRAA >60 03/20/2019 0835    No results found for: SPEP, UPEP  Lab Results  Component Value Date   WBC 3.5 (L) 03/20/2019   NEUTROABS 2.3 03/20/2019   HGB 10.8 (L) 03/20/2019   HCT 32.3 (L) 03/20/2019   MCV 97.0 03/20/2019   PLT 178 03/20/2019      Chemistry      Component Value Date/Time   NA 140 03/20/2019 0835   NA 141 05/17/2017 0957   K 3.8 03/20/2019 0835   K 3.6 05/17/2017 0957   CL 102 03/20/2019 0835   CL 105 11/19/2012 0848   CO2 28 03/20/2019 0835   CO2 25 05/17/2017 0957   BUN 27 (H) 03/20/2019 0835   BUN 15.2 05/17/2017 0957   CREATININE 0.88 03/20/2019 0835   CREATININE 0.8 05/17/2017 0957   GLU 158 (H) 01/14/2015 1035      Component Value Date/Time   CALCIUM 9.7 03/20/2019 0835   CALCIUM 9.6 05/17/2017 0957   ALKPHOS 57 03/20/2019 0835   ALKPHOS 57 05/17/2017 0957   AST 22 03/20/2019 0835   AST 25 05/17/2017 0957   ALT 16 03/20/2019 0835   ALT 18 05/17/2017 0957   BILITOT 0.4 03/20/2019 0835   BILITOT 0.47 05/17/2017  [id number]       All questions were answered. The patient knows to call the clinic with any problems, questions or concerns. No barriers to learning was detected.  I spent 15 minutes counseling the patient face to face. The total time spent in the appointment was 20 minutes and more than 50% was on counseling and review of test results  [name], MD 03/20/2019 11:23 AM

## 2019-03-20 NOTE — Assessment & Plan Note (Addendum)
This is due to side effects of treatment She is not symptomatic Observe only 

## 2019-03-20 NOTE — Telephone Encounter (Signed)
-----   Message from [name], MD sent at 03/20/2019 11:18 AM EDT ----- Regarding: high TSH Pls let her know TSH a bit high Is she taking her thyroid medicine consistently? If yes, pls call in 75 mcg If not, reinforce compliance and continue same dose

## 2019-03-20 NOTE — Assessment & Plan Note (Signed)
She has intermittent proteinuria even though her blood pressure remains well controlled She will continue aggressive blood pressure management 

## 2019-03-20 NOTE — Patient Instructions (Signed)
[facility] Discharge Instructions for Patients Receiving Chemotherapy  Today you received the following chemotherapy agent: Keytruda.  To help prevent nausea and vomiting after your treatment, we encourage you to take your nausea medication as directed.   If you develop nausea and vomiting that is not controlled by your nausea medication, call the clinic.   BELOW ARE SYMPTOMS THAT SHOULD BE REPORTED IMMEDIATELY:  *FEVER GREATER THAN 100.5 F  *CHILLS WITH OR WITHOUT FEVER  NAUSEA AND VOMITING THAT IS NOT CONTROLLED WITH YOUR NAUSEA MEDICATION  *UNUSUAL SHORTNESS OF BREATH  *UNUSUAL BRUISING OR BLEEDING  TENDERNESS IN MOUTH AND THROAT WITH OR WITHOUT PRESENCE OF ULCERS  *URINARY PROBLEMS  *BOWEL PROBLEMS  UNUSUAL RASH Items with * indicate a potential emergency and should be followed up as soon as possible.  Feel free to call the clinic should you have any questions or concerns. The clinic phone number is [phone].  Please show the CHEMO ALERT CARD at check-in to the Emergency Department and triage nurse.   

## 2019-03-26 MED FILL — AMLODIPINE BESYLATE 10 MG T: 10 | 90 days supply | Qty: 90 | Fill #1

## 2019-03-27 MED FILL — LOSARTAN POTASSIUM 50 MG TA: 50 | 30 days supply | Qty: 30 | Fill #6

## 2019-04-01 MED FILL — METOPROLOL TARTRATE 25 MG T: 25 | 90 days supply | Qty: 180 | Fill #1

## 2019-04-10 ENCOUNTER — Inpatient Hospital Stay: Payer: 59

## 2019-04-10 ENCOUNTER — Inpatient Hospital Stay: Payer: 59 | Attending: Hematology and Oncology

## 2019-04-10 ENCOUNTER — Other Ambulatory Visit: Payer: Self-pay

## 2019-04-10 VITALS — BP 132/77 | HR 82 | Temp 98.0°F | Resp 18

## 2019-04-10 DIAGNOSIS — Z5112 Encounter for antineoplastic immunotherapy: Secondary | ICD-10-CM | POA: Diagnosis not present

## 2019-04-10 DIAGNOSIS — C541 Malignant neoplasm of endometrium: Secondary | ICD-10-CM

## 2019-04-10 DIAGNOSIS — Z9221 Personal history of antineoplastic chemotherapy: Secondary | ICD-10-CM | POA: Insufficient documentation

## 2019-04-10 DIAGNOSIS — Z90710 Acquired absence of both cervix and uterus: Secondary | ICD-10-CM | POA: Insufficient documentation

## 2019-04-10 DIAGNOSIS — C786 Secondary malignant neoplasm of retroperitoneum and peritoneum: Secondary | ICD-10-CM | POA: Insufficient documentation

## 2019-04-10 DIAGNOSIS — Z923 Personal history of irradiation: Secondary | ICD-10-CM | POA: Diagnosis not present

## 2019-04-10 DIAGNOSIS — Z90722 Acquired absence of ovaries, bilateral: Secondary | ICD-10-CM | POA: Diagnosis not present

## 2019-04-10 DIAGNOSIS — Z7189 Other specified counseling: Secondary | ICD-10-CM

## 2019-04-10 DIAGNOSIS — Z95828 Presence of other vascular implants and grafts: Secondary | ICD-10-CM

## 2019-04-10 DIAGNOSIS — C770 Secondary and unspecified malignant neoplasm of lymph nodes of head, face and neck: Secondary | ICD-10-CM

## 2019-04-10 LAB — CBC WITH DIFFERENTIAL/PLATELET
Abs Immature Granulocytes: 0.01 10*3/uL (ref 0.00–0.07)
Basophils Absolute: 0.1 10*3/uL (ref 0.0–0.1)
Basophils Relative: 1 %
Eosinophils Absolute: 0.3 10*3/uL (ref 0.0–0.5)
Eosinophils Relative: 7 %
HCT: 34 % — ABNORMAL LOW (ref 36.0–46.0)
Hemoglobin: 11 g/dL — ABNORMAL LOW (ref 12.0–15.0)
Immature Granulocytes: 0 %
Lymphocytes Relative: 21 %
Lymphs Abs: 0.9 10*3/uL (ref 0.7–4.0)
MCH: 32.2 pg (ref 26.0–34.0)
MCHC: 32.4 g/dL (ref 30.0–36.0)
MCV: 99.4 fL (ref 80.0–100.0)
Monocytes Absolute: 0.3 10*3/uL (ref 0.1–1.0)
Monocytes Relative: 6 %
Neutro Abs: 2.8 10*3/uL (ref 1.7–7.7)
Neutrophils Relative %: 65 %
Platelets: 202 10*3/uL (ref 150–400)
RBC: 3.42 MIL/uL — ABNORMAL LOW (ref 3.87–5.11)
RDW: 12.9 % (ref 11.5–15.5)
WBC: 4.3 10*3/uL (ref 4.0–10.5)
nRBC: 0 % (ref 0.0–0.2)

## 2019-04-10 LAB — COMPREHENSIVE METABOLIC PANEL
ALT: 21 U/L (ref 0–44)
AST: 23 U/L (ref 15–41)
Albumin: 3.8 g/dL (ref 3.5–5.0)
Alkaline Phosphatase: 49 U/L (ref 38–126)
Anion gap: 10 (ref 5–15)
BUN: 21 mg/dL (ref 8–23)
CO2: 27 mmol/L (ref 22–32)
Calcium: 9.9 mg/dL (ref 8.9–10.3)
Chloride: 102 mmol/L (ref 98–111)
Creatinine, Ser: 0.85 mg/dL (ref 0.44–1.00)
GFR calc Af Amer: >60 mL/min (ref >60)
GFR calc non Af Amer: >60 mL/min (ref >60)
Glucose, Bld: 93 mg/dL (ref 70–99)
Potassium: 4.1 mmol/L (ref 3.5–5.1)
Sodium: 139 mmol/L (ref 135–145)
Total Bilirubin: 0.4 mg/dL (ref 0.3–1.2)
Total Protein: 7.1 g/dL (ref 6.5–8.1)

## 2019-04-10 LAB — TOTAL PROTEIN, URINE DIPSTICK: Protein, ur: 30 mg/dL — AB

## 2019-04-10 MED ORDER — SODIUM CHLORIDE 0.9% FLUSH
10.0000 mL | Freq: Once | INTRAVENOUS | Status: AC
  Administered 2019-04-10: 10 mL
  Filled 2019-04-10: qty 10

## 2019-04-10 MED ORDER — SODIUM CHLORIDE 0.9% FLUSH
10.0000 mL | INTRAVENOUS | Status: DC | PRN
  Administered 2019-04-10: 10 mL
  Filled 2019-04-10: qty 10

## 2019-04-10 MED ORDER — SODIUM CHLORIDE 0.9 % IV SOLN
200.0000 mg | Freq: Once | INTRAVENOUS | Status: AC
  Administered 2019-04-10: 200 mg via INTRAVENOUS
  Filled 2019-04-10: qty 8

## 2019-04-10 MED ORDER — SODIUM CHLORIDE 0.9 % IV SOLN
Freq: Once | INTRAVENOUS | Status: AC
  Administered 2019-04-10: 16:00:00 via INTRAVENOUS
  Filled 2019-04-10: qty 250

## 2019-04-10 MED ORDER — HEPARIN SOD (PORK) LOCK FLUSH 100 UNIT/ML IV SOLN
500.0000 [IU] | Freq: Once | INTRAVENOUS | Status: AC | PRN
  Administered 2019-04-10: 500 [IU]
  Filled 2019-04-10: qty 5

## 2019-04-10 NOTE — Patient Instructions (Signed)

## 2019-04-11 LAB — TSH: TSH: 2.088 u[IU]/mL (ref 0.308–3.960)

## 2019-04-18 MED FILL — LEVOTHYROXINE 75 MCG TABLET: 75 | 30 days supply | Qty: 30 | Fill #1

## 2019-04-21 MED FILL — LENVIMA 10 MG DAILY DOSE: 10 | 30 days supply | Qty: 30 | Fill #5

## 2019-04-28 MED FILL — LOSARTAN POTASSIUM 50 MG TA: 50 | 30 days supply | Qty: 30 | Fill #7

## 2019-05-08 ENCOUNTER — Inpatient Hospital Stay: Payer: 59

## 2019-05-08 ENCOUNTER — Inpatient Hospital Stay (HOSPITAL_BASED_OUTPATIENT_CLINIC_OR_DEPARTMENT_OTHER): Payer: 59 | Admitting: Hematology and Oncology

## 2019-05-08 ENCOUNTER — Inpatient Hospital Stay: Payer: 59 | Attending: Hematology and Oncology

## 2019-05-08 ENCOUNTER — Other Ambulatory Visit: Payer: Self-pay

## 2019-05-08 ENCOUNTER — Encounter: Payer: Self-pay | Admitting: Hematology and Oncology

## 2019-05-08 VITALS — BP 136/73 | HR 70 | Temp 98.3°F | Resp 18 | Ht 63.0 in | Wt 158.4 lb

## 2019-05-08 DIAGNOSIS — Z95828 Presence of other vascular implants and grafts: Secondary | ICD-10-CM

## 2019-05-08 DIAGNOSIS — C770 Secondary and unspecified malignant neoplasm of lymph nodes of head, face and neck: Secondary | ICD-10-CM

## 2019-05-08 DIAGNOSIS — Z79899 Other long term (current) drug therapy: Secondary | ICD-10-CM | POA: Diagnosis not present

## 2019-05-08 DIAGNOSIS — T451X5A Adverse effect of antineoplastic and immunosuppressive drugs, initial encounter: Secondary | ICD-10-CM | POA: Insufficient documentation

## 2019-05-08 DIAGNOSIS — C541 Malignant neoplasm of endometrium: Secondary | ICD-10-CM

## 2019-05-08 DIAGNOSIS — R809 Proteinuria, unspecified: Secondary | ICD-10-CM | POA: Insufficient documentation

## 2019-05-08 DIAGNOSIS — Z5112 Encounter for antineoplastic immunotherapy: Secondary | ICD-10-CM | POA: Diagnosis not present

## 2019-05-08 DIAGNOSIS — Z923 Personal history of irradiation: Secondary | ICD-10-CM | POA: Diagnosis not present

## 2019-05-08 DIAGNOSIS — D61818 Other pancytopenia: Secondary | ICD-10-CM | POA: Insufficient documentation

## 2019-05-08 DIAGNOSIS — Z9221 Personal history of antineoplastic chemotherapy: Secondary | ICD-10-CM | POA: Insufficient documentation

## 2019-05-08 DIAGNOSIS — Z171 Estrogen receptor negative status [ER-]: Secondary | ICD-10-CM | POA: Insufficient documentation

## 2019-05-08 DIAGNOSIS — I10 Essential (primary) hypertension: Secondary | ICD-10-CM

## 2019-05-08 DIAGNOSIS — Z7189 Other specified counseling: Secondary | ICD-10-CM

## 2019-05-08 LAB — COMPREHENSIVE METABOLIC PANEL
ALT: 17 U/L (ref 0–44)
AST: 21 U/L (ref 15–41)
Albumin: 3.7 g/dL (ref 3.5–5.0)
Alkaline Phosphatase: 48 U/L (ref 38–126)
Anion gap: 8 (ref 5–15)
BUN: 27 mg/dL — ABNORMAL HIGH (ref 8–23)
CO2: 30 mmol/L (ref 22–32)
Calcium: 9.5 mg/dL (ref 8.9–10.3)
Chloride: 103 mmol/L (ref 98–111)
Creatinine, Ser: 0.85 mg/dL (ref 0.44–1.00)
GFR calc Af Amer: >60 mL/min (ref >60)
GFR calc non Af Amer: >60 mL/min (ref >60)
Glucose, Bld: 95 mg/dL (ref 70–99)
Potassium: 3.8 mmol/L (ref 3.5–5.1)
Sodium: 141 mmol/L (ref 135–145)
Total Bilirubin: 0.4 mg/dL (ref 0.3–1.2)
Total Protein: 6.8 g/dL (ref 6.5–8.1)

## 2019-05-08 LAB — CBC WITH DIFFERENTIAL/PLATELET
Abs Immature Granulocytes: 0 10*3/uL (ref 0.00–0.07)
Basophils Absolute: 0 10*3/uL (ref 0.0–0.1)
Basophils Relative: 0 %
Eosinophils Absolute: 0.3 10*3/uL (ref 0.0–0.5)
Eosinophils Relative: 8 %
HCT: 32.1 % — ABNORMAL LOW (ref 36.0–46.0)
Hemoglobin: 10.4 g/dL — ABNORMAL LOW (ref 12.0–15.0)
Immature Granulocytes: 0 %
Lymphocytes Relative: 20 %
Lymphs Abs: 0.7 10*3/uL (ref 0.7–4.0)
MCH: 32.2 pg (ref 26.0–34.0)
MCHC: 32.4 g/dL (ref 30.0–36.0)
MCV: 99.4 fL (ref 80.0–100.0)
Monocytes Absolute: 0.3 10*3/uL (ref 0.1–1.0)
Monocytes Relative: 8 %
Neutro Abs: 2.2 10*3/uL (ref 1.7–7.7)
Neutrophils Relative %: 64 %
Platelets: 162 10*3/uL (ref 150–400)
RBC: 3.23 MIL/uL — ABNORMAL LOW (ref 3.87–5.11)
RDW: 13 % (ref 11.5–15.5)
WBC: 3.4 10*3/uL — ABNORMAL LOW (ref 4.0–10.5)
nRBC: 0 % (ref 0.0–0.2)

## 2019-05-08 LAB — TOTAL PROTEIN, URINE DIPSTICK: Protein, ur: <30 mg/dL — AB

## 2019-05-08 LAB — TSH: TSH: 2.983 u[IU]/mL (ref 0.308–3.960)

## 2019-05-08 MED ORDER — ONDANSETRON HCL 8 MG PO TABS: 8.0000 mg | ORAL_TABLET | Freq: Three times a day (TID) | ORAL | 3 refills | Status: DC | PRN

## 2019-05-08 MED ORDER — SODIUM CHLORIDE 0.9 % IV SOLN
Freq: Once | INTRAVENOUS | Status: AC
  Administered 2019-05-08: 11:00:00 via INTRAVENOUS
  Filled 2019-05-08: qty 250

## 2019-05-08 MED ORDER — HEPARIN SOD (PORK) LOCK FLUSH 100 UNIT/ML IV SOLN
500.0000 [IU] | Freq: Once | INTRAVENOUS | Status: AC | PRN
  Administered 2019-05-08: 500 [IU]
  Filled 2019-05-08: qty 5

## 2019-05-08 MED ORDER — SODIUM CHLORIDE 0.9% FLUSH
10.0000 mL | Freq: Once | INTRAVENOUS | Status: AC
  Administered 2019-05-08: 10 mL
  Filled 2019-05-08: qty 10

## 2019-05-08 MED ORDER — SODIUM CHLORIDE 0.9 % IV SOLN
200.0000 mg | Freq: Once | INTRAVENOUS | Status: AC
  Administered 2019-05-08: 200 mg via INTRAVENOUS
  Filled 2019-05-08: qty 8

## 2019-05-08 MED ORDER — SODIUM CHLORIDE 0.9% FLUSH
10.0000 mL | INTRAVENOUS | Status: DC | PRN
  Administered 2019-05-08: 10 mL
  Filled 2019-05-08: qty 10

## 2019-05-08 MED FILL — ONDANSETRON HCL 8 MG TABLET: 8 | 30 days supply | Qty: 30 | Fill #0

## 2019-05-08 NOTE — Assessment & Plan Note (Signed)
This is due to side effects of treatment She is not symptomatic Observe only 

## 2019-05-08 NOTE — Progress Notes (Signed)
[facility] OFFICE PROGRESS NOTE  Patient Care Team: [name], MD as PCP - General (Family Medicine) Reynold Bowen, MD as Consulting Physician (Endocrinology)  ASSESSMENT & PLAN:  Endometrial cancer [facility]) Recent PET scan showed positive response to treatment She has intermittent proteinuria secondary to [facility], of which the dose has already been reduced We will proceed with pembrolizumab The plan would be to continue treatment indefinitely I do not plan to repeat another imaging study for another few months, due next around March 2021  Essential hypertension She has intermittent proteinuria even though her blood pressure remains well controlled She will continue aggressive blood pressure management  Pancytopenia, acquired ([place]) This is due to side effects of treatment She is not symptomatic Observe only   No orders of the defined types were placed in this encounter.   INTERVAL HISTORY: Please see below for problem oriented charting. She returns for treatment and follow-up She tolerated treatment well No recent side effects Denies infusion reaction She has occasional symptoms of mild hand-foot syndrome but it does not bother her Her blood pressure control at home is good  SUMMARY OF ONCOLOGIC HISTORY: Oncology History Overview Note  [employer] testing done 11-2015 on surgical path from 2014: MS stable TMB low 4 muts/mb ATM [id number] ERBB3 T389K E2H2 rearrangement exon 9 PPP2R1A P179R TP53 I195N    ER- APPROXIMATELY 25-35% STAINING IN NEOPLASTIC CELLS (INTERMEDIATE)  PR- APPROXIMATELY 25-35% STAINING IN NEOPLASTIC CELLS (STRONG)  Repeat biopsy 04/02/17: ER negative, Her 2 negative  Progressed on Doxil   Endometrial cancer ([place])  06/20/2012 Pathology Results   Biopsy positive for papillary serous carcinoma   06/20/2012 Genetic Testing   Foundation One testing done 11-2015 on surgical path from 2014: MS stable TMB low  4 muts/mb ATM [id number] ERBB3 T389K E2H2 rearrangement exon 9 PPP2R1A P179R TP53 I195N    06/20/2012 Initial Diagnosis   Patient presented to PCP with intermittent vaginal bleeding since ~ Oct 2013, endometrial biopsy 06-20-12 with complex endometrial hyperplasia with atypia   06/26/2012 Imaging   Thickened endometrial lining in a postmenopausal patient experiencing vaginal bleeding. In the setting of post-menopausal bleeding, endometrial sampling is indicated to exclude carcinoma. No focal myometrial abnormalities are seen.  Normal left ovary and non-visualized right ovary   07/30/2012 Surgery   Dr. Polly Cobia performed robotic hysterectomy with bilateral salpingo-oophorectomy, bilateral pelvic lymph node dissection and periaortic lymph node dissection. Intraoperatively on frozen section, the patient was noted to have a large endometrial polyp with changes within the polyp consistent for high-grade malignancy, possibly papillary serous carcinoma. There is no obvious extrauterine disease noted.     07/30/2012 Pathology Results   [phone] SUPPLEMENTAL REPORT  THE ENDOMETRIAL CARCINOMA WAS ANALYZED FOR DNA MISMATCH REPAIR PROTEINS.IMMUNOHISTOCHEMICALLY, THE NEOPLASM RETAINED NUCLEAR EXPRESSION OF 4 GENE PRODUCTS, MLH1, MSH2, MSH6, AND PMS2, INVOLVED IN DNA MISMATCH REPAIR.POSITIVE AND NEGATIVE CONTROLS WORKED APPROPRIATELY.  PER REQUEST, AN ER AND PR ARE PERFORMED ON BLOCK 1I.  ER- APPROXIMATELY 25-35% STAINING IN NEOPLASTIC CELLS (INTERMEDIATE)  PR- APPROXIMATELY 25-35% STAINING IN NEOPLASTIC CELLS (STRONG)  ER AND PR PREDOMINANTLY SHOW STAINING IN THE SEROUS COMPONENT.  DIAGNOSIS  1. UTERUS, CERVIX WITH BILATERAL FALLOPIAN TUBES AND OVARIES,  HYSTERECTOMY AND BILATERAL SALPINGO-OOPHORECTOMY:  HIGH GRADE/POORLY DIFFERENTIATED ENDOMETRIAL ADENOCARCINOMA WITH SOLID AND SEROUS PAPILLARY COMPONENTS.  HISTOPATHOLOGIC TYPE:A VARIETY OF PATTERNS WERE PRESENT, ALL OF WHICH SHOULD  BE CONSIDERED TO BE HIGH GRADE.SEROUS PAPILLARY CARCINOMA WAS SEEN OCCURRING ADJACENT TO SOLID ENDOMETRIAL CARCINOMA WITH MARKED ANAPLASIA AND GIANT CELLS. SIZE:TUMOR MEASURED  AT LEAST 4.8 CM IN GREATEST HORIZONTAL DIMENSION.  GRADE: POORLY DIFFERENTIATED OR HIGH GRADE. DEPTH OF INVASION: NO DEFINITE MYOMETRIAL INVOLVEMENT WAS SEEN.THE TUMOR APPEARED CONFINED TO THE POLYP AS WELL AS SURFACE ENDOMETRIUM. WHERE MYOMETRIAL THICKNESS NOT APPLICABLE. SEROSAL INVOLVEMENT: NOT DEMONSTRATED.  ENDOCERVICAL INVOLVEMENT:NOT DEMONSTRATED. RESECTION MARGINS: FREE OF INVOLVEMENT. EXTRAUTERINE EXTENSION:NOT DEMONSTRATED. ANGIOLYMPHATIC INVASION: NOT DEFINITELY SEEN. TOTAL NODES EXAMINED:22.  PELVIC NODES EXAMINED:20.  PELVIC NODES INVOLVED:0.  PARA-AORTIC NODES 2. EXAMINED:  PARA-AORTIC NODES 0. INVOLVED TNM STAGE: T1A N0 MX. AJCC STAGE GROUPING: IA. FIGO STAGE:IA.   08/26/2012 Imaging   No CT evidence for intra-abdominal or pelvic metastatic disease. Trace free pelvic fluid, presumably postoperative although the date of surgery is not documented in the electronic medical record.   08/26/2012 - 12/13/2012 Chemotherapy   She received 6 cycles of carbo/taxol    10/29/2012 - 11/27/2012 Radiation Therapy   May 27, June 5,  June 11, June 19, November 27, 2012: Proximal vagina 30 Gy in 5 fractions     01/17/2013 Imaging   1.  No evidence of recurrent or metastatic disease. 2.  No acute abnormality involving the abdomen or pelvis. 3.  Mild diffuse hepatic steatosis. 4.  Very small supraumbilical midline anterior abdominal wall hernia containing fat, unchanged   01/22/2014 Imaging   No evidence for metastatic or recurrent disease. 2. No bowel obstruction.  Normal appendix. 3. Small fat containing hernia, stable in appearance. 4. Status post hysterectomy and bilateral oophorectomy   02/19/2014 Imaging   No pulmonary  lesions are identified. The abnormality on the chest x-ray is due to asymmetric left-sided sternoclavicular joint degenerative disease   10/12/2014 Imaging   New mild retroperitoneal lymphadenopathy in the left paraaortic region and proximal left common iliac chain, consistent with metastatic disease. No other sites of metastatic disease identified within the abdomen or pelvis.     11/27/2014 - 02/11/2015 Chemotherapy   She received 4 cycles of carbo/taxol    03/01/2015 PET scan   Single hypermetabolic small retroperitoneal lymph node along the aorta. 2. No evidence of metastatic disease otherwise in the abdomen or pelvis. No evidence local recurrence. 3. Intensely hypermetabolic enlarged nodule adjacent to the RIGHT lobe of thyroid gland. This presumably represents the biopsied lesion in clinician report which was found to be benign thyroid tissue.   04/05/2015 - 05/13/2015 Radiation Therapy   She received 50.4 gray in 28 fractions with simultaneous integrated boost to 56 gray   06/17/2015 Imaging   No acute process or evidence of metastatic disease in the abdomen or pelvis. Resolution of previously described retroperitoneal adenopathy. 2.  Possible constipation. 3. Atherosclerosis.   06/17/2015 Tumor Marker   Patient's tumor was tested for the following markers: CA125 Results of the tumor marker test revealed 33   08/12/2015 Tumor Marker   Patient's tumor was tested for the following markers: CA125 Results of the tumor marker test revealed 52   08/31/2015 PET scan   Development of right paratracheal hypermetabolic adenopathy, consistent with nodal metastasis. 2. The previously described isolated abdominal retroperitoneal hypermetabolic node has resolved. 3. Persistent hypermetabolic right thyroid nodule, per report previously biopsied. Correlate with those results. 4.  Possible constipation.   09/09/2015 -  Anti-estrogen oral therapy   She has been receiving alternative treatment between megace  and tamoxifen   09/09/2015 Tumor Marker   Patient's tumor was tested for the following markers: CA125 Results of the tumor marker test revealed 37.8   09/20/2015 Procedure   Technically successful ultrasound-guided thyroid aspiration biopsy ,  dominant right nodule   09/20/2015 Pathology Results   THYROID, RIGHT, FINE NEEDLE ASPIRATION (SPECIMEN 1 OF 1 COLLECTED 09/20/15): FINDINGS CONSISTENT WITH BENIGN THYROID NODULE (BETHESDA CATEGORY II).   10/28/2015 Tumor Marker   Patient's tumor was tested for the following markers: CA125 Results of the tumor marker test revealed 53.2   11/04/2015 Imaging   Stable benign right thyroid nodule   12/16/2015 Tumor Marker   Patient's tumor was tested for the following markers: CA125 Results of the tumor marker test revealed 38.1   03/22/2016 PET scan   Interval progression of hypermetabolic right paratracheal lymph node consistent with metastatic involvement. 2. Stable hypermetabolic right thyroid nodule. Reportedly this has been biopsied in the past.   03/23/2016 Tumor Marker   Patient's tumor was tested for the following markers: CA125 Results of the tumor marker test revealed 62.4   04/13/2016 - 06/01/2016 Radiation Therapy   She received 56 Gy to the chest in 28 fractions    05/09/2016 Imaging   No evidence of left lower extremity deep vein thrombosis. No evidence of a superficial thrombosis of the greater and lesser saphenous veins. Positive for thrombus noted in several varicosities of the calf. No evidence of Baker's cyst on the left.   05/17/2016 Tumor Marker   Patient's tumor was tested for the following markers: CA125 Results of the tumor marker test revealed 9   06/29/2016 Tumor Marker   Patient's tumor was tested for the following markers: CA125 Results of the tumor marker test revealed 5.9   08/04/2016 Tumor Marker   Patient's tumor was tested for the following markers: CA125 Results of the tumor marker test revealed 6.0   09/12/2016  PET scan   Complete metabolic response to therapy, with resolution of hypermetabolic mediastinal lymphadenopathy since prior exam. No residual or new metastatic disease identified. Stable hypermetabolic right thyroid lobe nodule, which was previously biopsied on 09/20/2015.   03/13/2017 PET scan   1. Solitary focus of recurrent right paratracheal hypermetabolic activity, with a 1.0 cm right lower paratracheal node having a maximum SUV of 9.0. Appearance compatible with recurrent malignancy. 2. Continued hypermetabolic right thyroid nodule, previously biopsied, presumed benign -correlate with prior biopsy results. 3. Other imaging findings of potential clinical significance: Aortic Atherosclerosis (ICD10-I70.0). Mild cardiomegaly. Prominent stool throughout the colon favors constipation.   04/02/2017 Pathology Results   FINE NEEDLE ASPIRATION, ENDOSCOPIC, (EBUS) 4 R NODE (SPECIMEN 1 OF 2 COLLECTED 04/02/17): MALIGNANT CELLS PRESENT, CONSISTENT WITH CARCINOMA. SEE COMMENT. COMMENT: THE MALIGNANT CELLS ARE POSITIVE FOR P53 AND NEGATIVE FOR ESTROGEN RECEPTOR AND TTF-1. THIS PROFILE IS NON-SPECIFIC, BUT THE P53 POSITIVE STAINING IS SUGGESTIVE OF GYNECOLOGIC PRIMARY.    04/11/2017 Procedure   Successful 8 French right internal jugular vein power port placement with its tip at the SVC/RA junction   04/12/2017 Imaging   Normal LV size with mild LV hypertrophy. EF 55-60%. Normal RV size and systolic function. Aortic valve sclerosis without significant stenosis.   04/18/2017 - 06/14/2017 Chemotherapy   The patient had chemotherapy with Doxil    07/10/2017 Imaging   - Left ventricle: The cavity size was normal. There was mild concentric hypertrophy. Systolic function was normal. The estimated ejection fraction was in the range of 55% to 60%. Wall motion was normal; there were no regional wall motion abnormalities. Doppler parameters are consistent with abnormal left ventricular relaxation (grade 1  diastolic dysfunction). - Aortic valve: Noncoronary cusp mobility was mildly restricted. - Mitral valve: There was mild regurgitation. - Left atrium: The atrium  was mildly dilated. - Atrial septum: No defect or patent foramen ovale was identified.  Impressions:  - Compared to November 2018, global LV longitudinal strain remains normal (has increased)   07/11/2017 PET scan   Increased size and hypermetabolic activity of 3.3 cm right thyroid lobe nodule. Thyroid carcinoma cannot be excluded. Recommend repeat ultrasound guided fine needle aspiration to exclude thyroid carcinoma.  New adjacent hypermetabolic 10 mm right supraclavicular lymph node, suspicious for lymph node metastasis.  Slight increase in size and hypermetabolic activity of solitary right paratracheal lymph node.  No evidence of abdominal or pelvic metastatic disease.   07/18/2017 Procedure   1. Technically successful ultrasound guided fine needle aspiration of indeterminate hypermetabolic right-sided thyroid nodule/mass. 2. Technically successful ultrasound-guided core needle biopsy of hypermetabolic right lower cervical lymph node.   07/18/2017 Pathology Results   THYROID, FINE NEEDLE ASPIRATION RIGHT (SPECIMEN 1 OF 1, COLLECTED ON 07/18/2017): ATYPIA OF UNDETERMINED SIGNIFICANCE OR FOLLICULAR LESION OF UNDETERMINED SIGNIFICANCE (BETHESDA CATEGORY III). SEE COMMENT. COMMENT: THE SPECIMEN CONSISTS OF SMALL AND MEDIUM SIZED GROUPS OF FOLLICULAR EPITHELIAL CELLS WITH MILD CYTOLOGIC ATYPIA INCLUDING NUCLEAR ENLARGEMENT AND HURTHLE CELL CHANGE. SOME GROUPS ARE ARRANGED AS MICROFOLLICLES. THERE IS MINIMAL BACKGROUND COLLOID. BASED ON THESE FEATURES, A FOLLICULAR LESION/NEOPLASM CAN NOT BE ENTIRELY RULED OUT. A SPECIMEN WILL BE SENT FOR AFIRMA TESTING.   07/19/2017 Pathology Results   Lymph node, needle/core biopsy - METASTATIC PAPILLARY SEROUS CARCINOMA. - SEE COMMENT. Microscopic Comment Dr. Vicente Males has reviewed the case and  concurs with this interpretation   10/15/2017 PET scan   No new or progressive disease. No evidence of abdominal or pelvic metastatic disease.  Stable hypermetabolic right thyroid lobe nodule and adjacent right supraclavicular lymph node.  Decreased size and hypermetabolic activity of solitary right paratracheal lymph node.   01/23/2018 PET scan   1. Overall, no significant change from PET-CT of 3 months ago. There is persistent hypermetabolic activity at the right thoracic inlet and within a right paratracheal mediastinal node. The nodes have not significantly changed in size, although the metabolic activity has minimally increased over this interval. It is uncertain how much of the thoracic inlet metabolic activity is attributed to the thyroid nodule versus adjacent cervical lymph nodes, both previously biopsied. 2. No new hypermetabolic activity within the neck, chest, abdomen or pelvis. No evidence of local recurrence in the pelvis. 3. Stable probable radiation changes in the right lung.   04/16/2018 PET scan   1. Interval increase in metabolic activity of the RIGHT supraclavicular node and RIGHT lower paratracheal mediastinal node with minimal change in size. Findings consistent with persistent and mildly progressed metastatic adenopathy. 2. New hypermetabolic small lymph node in the RIGHT lower paratracheal nodal station adjacent to the previously followed node. 3. No evidence of local recurrence in the pelvis or new disease elsewhere.   07/30/2018 PET scan   1. Mild interval progression of right supraclavicular, mediastinal, and right hilar hypermetabolic metastatic disease. There is a new hypermetabolic lymph node in the subcarinal station on today's study. 2. No hypermetabolic disease in the abdomen or pelvis to suggest recurrent/metastases. 3.  Aortic Atherosclerois (ICD10-170.0)    08/16/2018 -  Chemotherapy   The patient had Lenvima since 3/6 and pembrolizumab since 3/13 for  chemotherapy treatment. Michel Santee was held temporarily due to infection and severe hypertension   10/31/2018 PET scan   Partial response to therapy, with decreased hypermetabolic lymphadenopathy in mediastinum and right hilar region.  No significant change in hypermetabolic lymphadenopathy in right inferior  neck.  No new or progressive metastatic disease identified. No evidence of recurrent or metastatic carcinoma in the pelvis or abdomen   02/04/2019 PET scan   1. Decrease in metabolic activity of RIGHT supraclavicular node and RIGHT paratracheal lymph nodes. 2. Persistent activity in the RIGHT thyroid nodule unchanged. 3. Diffuse activity through the esophagus is favored esophagitis. No change. 4. Scattered foci of intense metabolic activity associated the bowel without focal lesion on CT favored benign. 5. No evidence of peritoneal metastasis. 6. No evidence of metastatic adenopathy in the abdomen pelvis. 7. No evidence of local pelvic sidewall recurrence.   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      REVIEW OF SYSTEMS:   Constitutional: Denies fevers, chills or abnormal weight loss Eyes: Denies blurriness of vision Ears, nose, mouth, throat, and face: Denies mucositis or sore throat Respiratory: Denies cough, dyspnea or wheezes Cardiovascular: Denies palpitation, chest discomfort or lower extremity swelling Gastrointestinal:  Denies nausea, heartburn or change in bowel habits Skin: Denies abnormal skin rashes Lymphatics: Denies new  lymphadenopathy or easy bruising Neurological:Denies numbness, tingling or new weaknesses Behavioral/Psych: Mood is stable, no new changes  All other systems were reviewed with the patient and are negative.  I have reviewed the past medical history, past surgical history, social history and family history with the patient and they are unchanged from previous note.  ALLERGIES:  has No Known Allergies.  MEDICATIONS:  Current Outpatient Medications  Medication Sig Dispense Refill  . amLODipine (NORVASC) 10 MG tablet TAKE 1 TABLET BY MOUTH DAILY. DECREASE SIMVASTATIN TO 20MG DAILY WHILE ON AMLODIPINE PER MD 90 tablet 1  . Calcium Carb-Cholecalciferol 500-600 MG-UNIT TABS Take 1 tablet by mouth daily.     . Cholecalciferol (VITAMIN D3) 2000 units TABS Take 2,000 Units by mouth daily.     . hydrochlorothiazide (HYDRODIURIL) 50 MG tablet TAKE 1 TABLET (50 MG TOTAL) BY MOUTH DAILY. 90 tablet 11  . lenvatinib 10 mg daily dose (LENVIMA, 10 MG DAILY DOSE,) capsule Take 1 capsule (10 mg total) by mouth daily. 30 capsule 11  . levothyroxine (SYNTHROID) 75 MCG tablet Take 1 tablet (75 mcg total) by mouth daily before breakfast. 30 tablet 2  . LORazepam (ATIVAN) 0.5 MG tablet Take 0.5 mg by mouth 2 (two) times daily as needed.  0  . losartan (COZAAR) 50 MG tablet Take 1 tablet (50 mg total) by mouth daily. 90 tablet 11  . metoprolol tartrate (LOPRESSOR) 25 MG tablet Take 1 tablet (25 mg total) by mouth 2 (two) times daily. 180 tablet 3  . ondansetron (ZOFRAN) 8 MG tablet Take 1 tablet (8 mg total) by mouth every 8 (eight) hours as needed for nausea. 30 tablet 3  . simvastatin (ZOCOR) 40 MG tablet Take 40 mg by mouth every evening.    . tacrolimus (PROTOPIC) 0.1 % ointment Apply 1 application topically daily as needed (rash).     . venlafaxine XR (EFFEXOR-XR) 75 MG 24 hr capsule Take 75 mg by mouth daily with breakfast.      No current facility-administered medications for this  visit.     Facility-Administered Medications Ordered in Other Visits  Medication Dose Route Frequency Provider Last Rate Last Dose  . heparin lock flush 100 unit/mL  500 Units Intracatheter Once PRN Alvy Bimler, Retal Tonkinson, MD      . pembrolizumab (KEYTRUDA) 200 mg in sodium chloride 0.9 % 50 mL chemo infusion  200 mg Intravenous Once Kiyoto Slomski, MD      . sodium chloride flush (NS) 0.9 % injection 10 mL  10 mL Intracatheter PRN Alvy Bimler, Tamyah Cutbirth, MD        PHYSICAL EXAMINATION: ECOG PERFORMANCE STATUS: 1 - Symptomatic but completely ambulatory  Vitals:   05/08/19 1018  BP: 136/73  Pulse: 70  Resp: 18  Temp: 98.3 F (36.8 C)  SpO2: 100%   Filed Weights   05/08/19 1018  Weight: 158 lb 6.4 oz (71.8 kg)    GENERAL:alert, no distress and comfortable SKIN: skin color, texture, turgor are normal, no rashes or significant lesions EYES: normal, Conjunctiva are pink and non-injected, sclera clear OROPHARYNX:no exudate, no erythema and lips, buccal mucosa, and tongue normal  NECK: supple, thyroid normal size, non-tender, without nodularity LYMPH:  no palpable lymphadenopathy in the cervical, axillary or inguinal LUNGS: clear to auscultation and percussion with normal breathing effort HEART: regular rate & rhythm and no murmurs and no lower extremity edema ABDOMEN:abdomen soft, non-tender and normal bowel sounds Musculoskeletal:no cyanosis of digits and no clubbing  NEURO: alert & oriented x 3 with fluent speech, no focal motor/sensory deficits  LABORATORY DATA:  I have reviewed the data as listed    Component Value Date/Time   NA 141 05/08/2019 0915   NA 141 05/17/2017 0957   K 3.8 05/08/2019 0915   K 3.6 05/17/2017 0957   CL 103 05/08/2019 0915   CL 105 11/19/2012 0848   CO2 30 05/08/2019 0915   CO2 25 05/17/2017 0957   GLUCOSE 95 05/08/2019 0915   GLUCOSE 85 05/17/2017 0957   GLUCOSE 122 (H) 11/19/2012 0848   BUN 27 (H) 05/08/2019 0915   BUN 15.2 05/17/2017 0957   CREATININE 0.85 05/08/2019 0915    CREATININE 0.8 05/17/2017 0957   CALCIUM 9.5 05/08/2019 0915   CALCIUM 9.6 05/17/2017 0957   PROT 6.8 05/08/2019 0915   PROT 7.0 05/17/2017 0957   ALBUMIN 3.7 05/08/2019 0915   ALBUMIN 3.9 05/17/2017 0957   AST 21 05/08/2019 0915   AST 25 05/17/2017 0957   ALT 17 05/08/2019 0915   ALT 18 05/17/2017 0957   ALKPHOS 48 05/08/2019 0915   ALKPHOS 57 05/17/2017 0957   BILITOT 0.4 05/08/2019 0915   BILITOT 0.47 05/17/2017 0957   GFRNONAA >60 05/08/2019 0915   GFRAA >60 05/08/2019 0915    No results found for: SPEP, UPEP  Lab Results  Component Value Date   WBC 3.4 (L) 05/08/2019   NEUTROABS 2.2 05/08/2019   HGB 10.4 (L) 05/08/2019   HCT 32.1 (L) 05/08/2019   MCV 99.4 05/08/2019   PLT 162 05/08/2019      Chemistry      Component Value Date/Time   NA 141 05/08/2019 0915   NA 141 05/17/2017 0957   K 3.8 05/08/2019 0915   K 3.6 05/17/2017 0957   CL 103 05/08/2019 0915   CL 105 11/19/2012 0848   CO2 30 05/08/2019 0915   CO2 25 05/17/2017 0957   BUN 27 (H) 05/08/2019 0915   BUN 15.2 05/17/2017 0957   CREATININE 0.85 05/08/2019 0915   CREATININE 0.8 05/17/2017 0957  GLU 158 (H) 01/14/2015 1035      Component Value Date/Time   CALCIUM 9.5 05/08/2019 0915   CALCIUM 9.6 05/17/2017 0957   ALKPHOS 48 05/08/2019 0915   ALKPHOS 57 05/17/2017 0957   AST 21 05/08/2019 0915   AST 25 05/17/2017 0957   ALT 17 05/08/2019 0915   ALT 18 05/17/2017 0957   BILITOT 0.4 05/08/2019 0915   BILITOT 0.47 05/17/2017 0957       All questions were answered. The patient knows to call the clinic with any problems, questions or concerns. No barriers to learning was detected.  I spent 15 minutes counseling the patient face to face. The total time spent in the appointment was 20 minutes and more than 50% was on counseling and review of test results  [name], MD 05/08/2019 11:03 AM

## 2019-05-08 NOTE — Assessment & Plan Note (Signed)
She has intermittent proteinuria even though her blood pressure remains well controlled She will continue aggressive blood pressure management 

## 2019-05-08 NOTE — Assessment & Plan Note (Signed)
Recent PET scan showed positive response to treatment She has intermittent proteinuria secondary to [facility], of which the dose has already been reduced We will proceed with pembrolizumab The plan would be to continue treatment indefinitely I do not plan to repeat another imaging study for another few months, due next around March 2021

## 2019-05-12 ENCOUNTER — Telehealth: Payer: Self-pay | Admitting: Hematology and Oncology

## 2019-05-12 NOTE — Telephone Encounter (Signed)
Scheduled appt per 12/3 sch message . Pt is aware of appt date and time

## 2019-05-19 MED FILL — VENLAFAXINE HCL ER 150 MG C: 150 | 30 days supply | Qty: 60 | Fill #2

## 2019-05-19 MED FILL — LEVOTHYROXINE 75 MCG TABLET: 75 | 30 days supply | Qty: 30 | Fill #2

## 2019-05-23 MED FILL — LENVIMA 10 MG DAILY DOSE: 10 | 30 days supply | Qty: 30 | Fill #6

## 2019-05-23 MED FILL — LOSARTAN POTASSIUM 50 MG TA: 50 | 30 days supply | Qty: 30 | Fill #8

## 2019-05-27 MED FILL — busPIRone HCL 10 MG TABS: 10 | 90 days supply | Qty: 180 | Fill #0

## 2019-05-29 ENCOUNTER — Inpatient Hospital Stay: Payer: 59

## 2019-05-29 ENCOUNTER — Other Ambulatory Visit: Payer: Self-pay

## 2019-05-29 VITALS — BP 128/71 | HR 68 | Temp 98.0°F

## 2019-05-29 DIAGNOSIS — D61818 Other pancytopenia: Secondary | ICD-10-CM | POA: Diagnosis not present

## 2019-05-29 DIAGNOSIS — I10 Essential (primary) hypertension: Secondary | ICD-10-CM | POA: Diagnosis not present

## 2019-05-29 DIAGNOSIS — Z9221 Personal history of antineoplastic chemotherapy: Secondary | ICD-10-CM | POA: Diagnosis not present

## 2019-05-29 DIAGNOSIS — Z923 Personal history of irradiation: Secondary | ICD-10-CM | POA: Diagnosis not present

## 2019-05-29 DIAGNOSIS — C770 Secondary and unspecified malignant neoplasm of lymph nodes of head, face and neck: Secondary | ICD-10-CM

## 2019-05-29 DIAGNOSIS — C541 Malignant neoplasm of endometrium: Secondary | ICD-10-CM | POA: Diagnosis not present

## 2019-05-29 DIAGNOSIS — T451X5A Adverse effect of antineoplastic and immunosuppressive drugs, initial encounter: Secondary | ICD-10-CM | POA: Diagnosis not present

## 2019-05-29 DIAGNOSIS — Z7189 Other specified counseling: Secondary | ICD-10-CM

## 2019-05-29 DIAGNOSIS — R809 Proteinuria, unspecified: Secondary | ICD-10-CM | POA: Diagnosis not present

## 2019-05-29 DIAGNOSIS — Z171 Estrogen receptor negative status [ER-]: Secondary | ICD-10-CM | POA: Diagnosis not present

## 2019-05-29 DIAGNOSIS — Z95828 Presence of other vascular implants and grafts: Secondary | ICD-10-CM

## 2019-05-29 DIAGNOSIS — Z5112 Encounter for antineoplastic immunotherapy: Secondary | ICD-10-CM | POA: Diagnosis not present

## 2019-05-29 LAB — CBC WITH DIFFERENTIAL (CANCER CENTER ONLY)
Abs Immature Granulocytes: 0.01 10*3/uL (ref 0.00–0.07)
Basophils Absolute: 0.1 10*3/uL (ref 0.0–0.1)
Basophils Relative: 1 %
Eosinophils Absolute: 0.3 10*3/uL (ref 0.0–0.5)
Eosinophils Relative: 6 %
HCT: 32.6 % — ABNORMAL LOW (ref 36.0–46.0)
Hemoglobin: 11 g/dL — ABNORMAL LOW (ref 12.0–15.0)
Immature Granulocytes: 0 %
Lymphocytes Relative: 17 %
Lymphs Abs: 0.8 10*3/uL (ref 0.7–4.0)
MCH: 32.4 pg (ref 26.0–34.0)
MCHC: 33.7 g/dL (ref 30.0–36.0)
MCV: 96.2 fL (ref 80.0–100.0)
Monocytes Absolute: 0.4 10*3/uL (ref 0.1–1.0)
Monocytes Relative: 8 %
Neutro Abs: 3 10*3/uL (ref 1.7–7.7)
Neutrophils Relative %: 68 %
Platelet Count: 176 10*3/uL (ref 150–400)
RBC: 3.39 MIL/uL — ABNORMAL LOW (ref 3.87–5.11)
RDW: 12.9 % (ref 11.5–15.5)
WBC Count: 4.5 10*3/uL (ref 4.0–10.5)
nRBC: 0 % (ref 0.0–0.2)

## 2019-05-29 LAB — COMPREHENSIVE METABOLIC PANEL
ALT: 20 U/L (ref 0–44)
AST: 26 U/L (ref 15–41)
Albumin: 3.9 g/dL (ref 3.5–5.0)
Alkaline Phosphatase: 52 U/L (ref 38–126)
Anion gap: 10 (ref 5–15)
BUN: 27 mg/dL — ABNORMAL HIGH (ref 8–23)
CO2: 26 mmol/L (ref 22–32)
Calcium: 9.1 mg/dL (ref 8.9–10.3)
Chloride: 102 mmol/L (ref 98–111)
Creatinine, Ser: 0.88 mg/dL (ref 0.44–1.00)
GFR calc Af Amer: >60 mL/min (ref >60)
GFR calc non Af Amer: >60 mL/min (ref >60)
Glucose, Bld: 101 mg/dL — ABNORMAL HIGH (ref 70–99)
Potassium: 3.6 mmol/L (ref 3.5–5.1)
Sodium: 138 mmol/L (ref 135–145)
Total Bilirubin: 0.5 mg/dL (ref 0.3–1.2)
Total Protein: 7.2 g/dL (ref 6.5–8.1)

## 2019-05-29 LAB — TOTAL PROTEIN, URINE DIPSTICK: Protein, ur: <30 mg/dL — AB

## 2019-05-29 LAB — TSH: TSH: 4.189 u[IU]/mL — ABNORMAL HIGH (ref 0.308–3.960)

## 2019-05-29 MED ORDER — SODIUM CHLORIDE 0.9 % IV SOLN
200.0000 mg | Freq: Once | INTRAVENOUS | Status: AC
  Administered 2019-05-29: 200 mg via INTRAVENOUS
  Filled 2019-05-29: qty 8

## 2019-05-29 MED ORDER — HEPARIN SOD (PORK) LOCK FLUSH 100 UNIT/ML IV SOLN
500.0000 [IU] | Freq: Once | INTRAVENOUS | Status: AC | PRN
  Administered 2019-05-29: 500 [IU]
  Filled 2019-05-29: qty 5

## 2019-05-29 MED ORDER — SODIUM CHLORIDE 0.9% FLUSH
10.0000 mL | Freq: Once | INTRAVENOUS | Status: AC
  Administered 2019-05-29: 10 mL
  Filled 2019-05-29: qty 10

## 2019-05-29 MED ORDER — SODIUM CHLORIDE 0.9 % IV SOLN
Freq: Once | INTRAVENOUS | Status: AC
  Filled 2019-05-29: qty 250

## 2019-05-29 MED ORDER — SODIUM CHLORIDE 0.9% FLUSH
10.0000 mL | INTRAVENOUS | Status: DC | PRN
  Administered 2019-05-29: 10 mL
  Filled 2019-05-29: qty 10

## 2019-05-29 NOTE — Patient Instructions (Signed)
[facility] Discharge Instructions for Patients Receiving Chemotherapy  Today you received the following chemotherapy agent: Keytruda.  To help prevent nausea and vomiting after your treatment, we encourage you to take your nausea medication as directed.   If you develop nausea and vomiting that is not controlled by your nausea medication, call the clinic.   BELOW ARE SYMPTOMS THAT SHOULD BE REPORTED IMMEDIATELY:  *FEVER GREATER THAN 100.5 F  *CHILLS WITH OR WITHOUT FEVER  NAUSEA AND VOMITING THAT IS NOT CONTROLLED WITH YOUR NAUSEA MEDICATION  *UNUSUAL SHORTNESS OF BREATH  *UNUSUAL BRUISING OR BLEEDING  TENDERNESS IN MOUTH AND THROAT WITH OR WITHOUT PRESENCE OF ULCERS  *URINARY PROBLEMS  *BOWEL PROBLEMS  UNUSUAL RASH Items with * indicate a potential emergency and should be followed up as soon as possible.  Feel free to call the clinic should you have any questions or concerns. The clinic phone number is [phone].  Please show the CHEMO ALERT CARD at check-in to the Emergency Department and triage nurse.   

## 2019-06-11 MED FILL — HYDROCHLOROTHIAZIDE 50 MG T: 50 | 90 days supply | Qty: 90 | Fill #1

## 2019-06-11 MED FILL — SIMVASTATIN 20 MG TABLET: 20 | 90 days supply | Qty: 90 | Fill #1

## 2019-06-12 MED FILL — VENLAFAXINE HCL ER 150 MG C: 150 | 30 days supply | Qty: 60 | Fill #3

## 2019-06-17 MED FILL — LENVIMA 10 MG DAILY DOSE: 10 | 30 days supply | Qty: 30 | Fill #7

## 2019-06-17 MED FILL — LOSARTAN POTASSIUM 50 MG TA: 50 | 30 days supply | Qty: 30 | Fill #9

## 2019-06-19 ENCOUNTER — Inpatient Hospital Stay (HOSPITAL_BASED_OUTPATIENT_CLINIC_OR_DEPARTMENT_OTHER): Payer: 59 | Admitting: Hematology and Oncology

## 2019-06-19 ENCOUNTER — Inpatient Hospital Stay: Payer: 59

## 2019-06-19 ENCOUNTER — Encounter: Payer: Self-pay | Admitting: Hematology and Oncology

## 2019-06-19 ENCOUNTER — Inpatient Hospital Stay: Payer: 59 | Attending: Hematology and Oncology

## 2019-06-19 ENCOUNTER — Other Ambulatory Visit: Payer: Self-pay | Admitting: Hematology and Oncology

## 2019-06-19 ENCOUNTER — Telehealth: Payer: Self-pay | Admitting: Hematology and Oncology

## 2019-06-19 ENCOUNTER — Other Ambulatory Visit: Payer: Self-pay

## 2019-06-19 VITALS — BP 127/64 | HR 71 | Temp 97.4°F | Resp 18 | Ht 63.0 in | Wt 160.6 lb

## 2019-06-19 DIAGNOSIS — I10 Essential (primary) hypertension: Secondary | ICD-10-CM

## 2019-06-19 DIAGNOSIS — Z95828 Presence of other vascular implants and grafts: Secondary | ICD-10-CM

## 2019-06-19 DIAGNOSIS — C770 Secondary and unspecified malignant neoplasm of lymph nodes of head, face and neck: Secondary | ICD-10-CM

## 2019-06-19 DIAGNOSIS — Z9221 Personal history of antineoplastic chemotherapy: Secondary | ICD-10-CM | POA: Diagnosis not present

## 2019-06-19 DIAGNOSIS — Z79899 Other long term (current) drug therapy: Secondary | ICD-10-CM | POA: Diagnosis not present

## 2019-06-19 DIAGNOSIS — H269 Unspecified cataract: Secondary | ICD-10-CM

## 2019-06-19 DIAGNOSIS — R809 Proteinuria, unspecified: Secondary | ICD-10-CM | POA: Insufficient documentation

## 2019-06-19 DIAGNOSIS — Z923 Personal history of irradiation: Secondary | ICD-10-CM | POA: Diagnosis not present

## 2019-06-19 DIAGNOSIS — C541 Malignant neoplasm of endometrium: Secondary | ICD-10-CM

## 2019-06-19 DIAGNOSIS — Z7189 Other specified counseling: Secondary | ICD-10-CM

## 2019-06-19 DIAGNOSIS — D61818 Other pancytopenia: Secondary | ICD-10-CM | POA: Diagnosis not present

## 2019-06-19 DIAGNOSIS — E039 Hypothyroidism, unspecified: Secondary | ICD-10-CM | POA: Diagnosis not present

## 2019-06-19 DIAGNOSIS — G35 Multiple sclerosis: Secondary | ICD-10-CM | POA: Insufficient documentation

## 2019-06-19 DIAGNOSIS — Z5112 Encounter for antineoplastic immunotherapy: Secondary | ICD-10-CM | POA: Diagnosis not present

## 2019-06-19 DIAGNOSIS — C772 Secondary and unspecified malignant neoplasm of intra-abdominal lymph nodes: Secondary | ICD-10-CM

## 2019-06-19 DIAGNOSIS — R946 Abnormal results of thyroid function studies: Secondary | ICD-10-CM | POA: Diagnosis not present

## 2019-06-19 DIAGNOSIS — Z171 Estrogen receptor negative status [ER-]: Secondary | ICD-10-CM | POA: Insufficient documentation

## 2019-06-19 LAB — CBC WITH DIFFERENTIAL/PLATELET
Abs Immature Granulocytes: 0 10*3/uL (ref 0.00–0.07)
Basophils Absolute: 0 10*3/uL (ref 0.0–0.1)
Basophils Relative: 1 %
Eosinophils Absolute: 0.2 10*3/uL (ref 0.0–0.5)
Eosinophils Relative: 7 %
HCT: 31.7 % — ABNORMAL LOW (ref 36.0–46.0)
Hemoglobin: 10.6 g/dL — ABNORMAL LOW (ref 12.0–15.0)
Immature Granulocytes: 0 %
Lymphocytes Relative: 21 %
Lymphs Abs: 0.7 10*3/uL (ref 0.7–4.0)
MCH: 33 pg (ref 26.0–34.0)
MCHC: 33.4 g/dL (ref 30.0–36.0)
MCV: 98.8 fL (ref 80.0–100.0)
Monocytes Absolute: 0.3 10*3/uL (ref 0.1–1.0)
Monocytes Relative: 8 %
Neutro Abs: 2.1 10*3/uL (ref 1.7–7.7)
Neutrophils Relative %: 63 %
Platelets: 169 10*3/uL (ref 150–400)
RBC: 3.21 MIL/uL — ABNORMAL LOW (ref 3.87–5.11)
RDW: 12.6 % (ref 11.5–15.5)
WBC: 3.4 10*3/uL — ABNORMAL LOW (ref 4.0–10.5)
nRBC: 0 % (ref 0.0–0.2)

## 2019-06-19 LAB — COMPREHENSIVE METABOLIC PANEL
ALT: 19 U/L (ref 0–44)
AST: 22 U/L (ref 15–41)
Albumin: 3.7 g/dL (ref 3.5–5.0)
Alkaline Phosphatase: 49 U/L (ref 38–126)
Anion gap: 10 (ref 5–15)
BUN: 26 mg/dL — ABNORMAL HIGH (ref 8–23)
CO2: 28 mmol/L (ref 22–32)
Calcium: 9 mg/dL (ref 8.9–10.3)
Chloride: 101 mmol/L (ref 98–111)
Creatinine, Ser: 0.88 mg/dL (ref 0.44–1.00)
GFR calc Af Amer: >60 mL/min (ref >60)
GFR calc non Af Amer: >60 mL/min (ref >60)
Glucose, Bld: 103 mg/dL — ABNORMAL HIGH (ref 70–99)
Potassium: 4.1 mmol/L (ref 3.5–5.1)
Sodium: 139 mmol/L (ref 135–145)
Total Bilirubin: 0.5 mg/dL (ref 0.3–1.2)
Total Protein: 6.8 g/dL (ref 6.5–8.1)

## 2019-06-19 LAB — TOTAL PROTEIN, URINE DIPSTICK

## 2019-06-19 LAB — TSH: TSH: 2.878 u[IU]/mL (ref 0.308–3.960)

## 2019-06-19 MED ORDER — SODIUM CHLORIDE 0.9% FLUSH
10.0000 mL | INTRAVENOUS | Status: DC | PRN
  Administered 2019-06-19: 10 mL
  Filled 2019-06-19: qty 10

## 2019-06-19 MED ORDER — LEVOTHYROXINE SODIUM 75 MCG PO TABS: 75.0000 ug | ORAL_TABLET | Freq: Every day | ORAL | 2 refills | Status: DC

## 2019-06-19 MED ORDER — HEPARIN SOD (PORK) LOCK FLUSH 100 UNIT/ML IV SOLN
500.0000 [IU] | Freq: Once | INTRAVENOUS | Status: AC | PRN
  Administered 2019-06-19: 500 [IU]
  Filled 2019-06-19: qty 5

## 2019-06-19 MED ORDER — SODIUM CHLORIDE 0.9 % IV SOLN
Freq: Once | INTRAVENOUS | Status: AC
  Filled 2019-06-19: qty 250

## 2019-06-19 MED ORDER — SODIUM CHLORIDE 0.9% FLUSH
10.0000 mL | Freq: Once | INTRAVENOUS | Status: AC
  Administered 2019-06-19: 10 mL
  Filled 2019-06-19: qty 10

## 2019-06-19 MED ORDER — SODIUM CHLORIDE 0.9 % IV SOLN
200.0000 mg | Freq: Once | INTRAVENOUS | Status: AC
  Administered 2019-06-19: 200 mg via INTRAVENOUS
  Filled 2019-06-19: qty 8

## 2019-06-19 MED FILL — LEVOTHYROXINE 75 MCG TABLET: 75 | 90 days supply | Qty: 90 | Fill #0

## 2019-06-19 NOTE — Assessment & Plan Note (Signed)
She has intermittent elevated TSH I will adjust her thyroid medicine accordingly 

## 2019-06-19 NOTE — Patient Instructions (Signed)

## 2019-06-19 NOTE — Telephone Encounter (Signed)
Scheduled appt per 11/4 sch message - unable to reach pt . Left message with appt date and time   

## 2019-06-19 NOTE — Assessment & Plan Note (Addendum)
Recent PET scan showed positive response to treatment She has intermittent proteinuria secondary to [facility], of which the dose has already been reduced We will proceed with pembrolizumab The plan would be to continue treatment indefinitely I plan to repeat imaging study next month for objective assessment of response to therapy There is no contraindication for her to proceed with Covid vaccine

## 2019-06-19 NOTE — Assessment & Plan Note (Signed)
She is inquiring about cataract surgery I would prefer her to hold off until after PET CT scan result is available next month

## 2019-06-19 NOTE — Patient Instructions (Signed)
Pembrolizumab injection What is this medicine? PEMBROLIZUMAB (pem broe liz ue mab) is a monoclonal antibody. It is used to treat certain types of cancer. This medicine may be used for other purposes; ask your health care provider or pharmacist if you have questions. COMMON BRAND NAME(S): Keytruda What should I tell my health care provider before I take this medicine? They need to know if you have any of these conditions:  diabetes  immune system problems  inflammatory bowel disease  liver disease  lung or breathing disease  lupus  received or scheduled to receive an organ transplant or a stem-cell transplant that uses donor stem cells  an unusual or allergic reaction to pembrolizumab, other medicines, foods, dyes, or preservatives  pregnant or trying to get pregnant  breast-feeding How should I use this medicine? This medicine is for infusion into a vein. It is given by a health care professional in a hospital or clinic setting. A special MedGuide will be given to you before each treatment. Be sure to read this information carefully each time. Talk to your pediatrician regarding the use of this medicine in children. While this drug may be prescribed for children as young as 6 months for selected conditions, precautions do apply. Overdosage: If you think you have taken too much of this medicine contact a poison control center or emergency room at once. NOTE: This medicine is only for you. Do not share this medicine with others. What if I miss a dose? It is important not to miss your dose. Call your doctor or health care professional if you are unable to keep an appointment. What may interact with this medicine? Interactions have not been studied. Give your health care provider a list of all the medicines, herbs, non-prescription drugs, or dietary supplements you use. Also tell them if you smoke, drink alcohol, or use illegal drugs. Some items may interact with your medicine. This  list may not describe all possible interactions. Give your health care provider a list of all the medicines, herbs, non-prescription drugs, or dietary supplements you use. Also tell them if you smoke, drink alcohol, or use illegal drugs. Some items may interact with your medicine. What should I watch for while using this medicine? Your condition will be monitored carefully while you are receiving this medicine. You may need blood work done while you are taking this medicine. Do not become pregnant while taking this medicine or for 4 months after stopping it. Women should inform their doctor if they wish to become pregnant or think they might be pregnant. There is a potential for serious side effects to an unborn child. Talk to your health care professional or pharmacist for more information. Do not breast-feed an infant while taking this medicine or for 4 months after the last dose. What side effects may I notice from receiving this medicine? Side effects that you should report to your doctor or health care professional as soon as possible:  allergic reactions like skin rash, itching or hives, swelling of the face, lips, or tongue  bloody or black, tarry  breathing problems  changes in vision  chest pain  chills  confusion  constipation  cough  diarrhea  dizziness or feeling faint or lightheaded  fast or irregular heartbeat  fever  flushing  joint pain  low blood counts - this medicine may decrease the number of white blood cells, red blood cells and platelets. You may be at increased risk for infections and bleeding.  muscle pain  muscle   weakness  pain, tingling, numbness in the hands or feet  persistent headache  redness, blistering, peeling or loosening of the skin, including inside the mouth  signs and symptoms of high blood sugar such as dizziness; dry mouth; dry skin; fruity breath; nausea; stomach pain; increased hunger or thirst; increased urination  signs  and symptoms of kidney injury like trouble passing urine or change in the amount of urine  signs and symptoms of liver injury like dark urine, light-colored stools, loss of appetite, nausea, right upper belly pain, yellowing of the eyes or skin  sweating  swollen lymph nodes  weight loss Side effects that usually do not require medical attention (report to your doctor or health care professional if they continue or are bothersome):  decreased appetite  hair loss  muscle pain  tiredness This list may not describe all possible side effects. Call your doctor for medical advice about side effects. You may report side effects to FDA at 1-800-FDA-1088. Where should I keep my medicine? This drug is given in a hospital or clinic and will not be stored at home. NOTE: This sheet is a summary. It may not cover all possible information. If you have questions about this medicine, talk to your doctor, pharmacist, or health care provider.  2020 Elsevier/Gold Standard (2019-03-28 18:07:58)  Coronavirus (COVID-19) Are you at risk?  Are you at risk for the Coronavirus (COVID-19)?  To be considered HIGH RISK for Coronavirus (COVID-19), you have to meet the following criteria:  . Traveled to [place], [place], [place], [place] or [place]; or in the [place] to [place], [place], [place], or [place]; and have fever, cough, and shortness of breath within the last 2 weeks of travel OR . Been in close contact with a person diagnosed with COVID-19 within the last 2 weeks and have fever, cough, and shortness of breath . IF YOU DO NOT MEET THESE CRITERIA, YOU ARE CONSIDERED LOW RISK FOR COVID-19.  What to do if you are HIGH RISK for COVID-19?  . If you are having a medical emergency, call 911. . Seek medical care right away. Before you go to a doctor's office, urgent care or emergency department, call ahead and tell them about your recent travel, contact with someone diagnosed with COVID-19, and  your symptoms. You should receive instructions from your physician's office regarding next steps of care.  . When you arrive at healthcare provider, tell the healthcare staff immediately you have returned from visiting [place], [place], [place], [place] or [place]; or traveled in the [place] to [place], [place], [place], or [place]; in the last two weeks or you have been in close contact with a person diagnosed with COVID-19 in the last 2 weeks.   . Tell the health care staff about your symptoms: fever, cough and shortness of breath. . After you have been seen by a medical provider, you will be either: o Tested for (COVID-19) and discharged home on quarantine except to seek medical care if symptoms worsen, and asked to  - Stay home and avoid contact with others until you get your results (4-5 days)  - Avoid travel on public transportation if possible (such as bus, train, or airplane) or o Sent to the Emergency Department by EMS for evaluation, COVID-19 testing, and possible admission depending on your condition and test results.  What to do if you are LOW RISK for COVID-19?  Reduce your risk of any infection by using the same precautions used   for avoiding the common cold or flu:  . Wash your hands often with soap and warm water for at least 20 seconds.  If soap and water are not readily available, use an alcohol-based hand sanitizer with at least 60% alcohol.  . If coughing or sneezing, cover your mouth and nose by coughing or sneezing into the elbow areas of your shirt or coat, into a tissue or into your sleeve (not your hands). . Avoid shaking hands with others and consider head nods or verbal greetings only. . Avoid touching your eyes, nose, or mouth with unwashed hands.  . Avoid close contact with people who are sick. . Avoid places or events with large numbers of people in one location, like concerts or sporting events. . Carefully consider travel plans you have or are  making. . If you are planning any travel outside or inside the [place], visit the CDC's Travelers' Health webpage for the latest health notices. . If you have some symptoms but not all symptoms, continue to monitor at home and seek medical attention if your symptoms worsen. . If you are having a medical emergency, call 911.   ADDITIONAL HEALTHCARE OPTIONS FOR PATIENTS  [facility] / e-Visit: [email or web address]         [facility]: [phone]  [facility]: [phone]                   [facility]: [phone]   

## 2019-06-19 NOTE — Assessment & Plan Note (Signed)
This is due to side effects of treatment She is not symptomatic Observe only 

## 2019-06-19 NOTE — Progress Notes (Signed)
[facility] OFFICE PROGRESS NOTE  Patient Care Team: [name], MD as PCP - General (Family Medicine) Reynold Bowen, MD as Consulting Physician (Endocrinology)  ASSESSMENT & PLAN:  Endometrial cancer [facility]) Recent PET scan showed positive response to treatment She has intermittent proteinuria secondary to [facility], of which the dose has already been reduced We will proceed with pembrolizumab The plan would be to continue treatment indefinitely I plan to repeat imaging study next month for objective assessment of response to therapy There is no contraindication for her to proceed with Covid vaccine  Pancytopenia, acquired ([place]) This is due to side effects of treatment She is not symptomatic Observe only  Essential hypertension She has intermittent proteinuria even though her blood pressure remains well controlled She will continue aggressive blood pressure management  Acquired hypothyroidism She has intermittent elevated TSH I will adjust her thyroid medicine accordingly  Cataracts, bilateral She is inquiring about cataract surgery I would prefer her to hold off until after PET CT scan result is available next month   Orders Placed This Encounter  Procedures  . NM PET Image Restag (PS) Skull Base To Thigh    Standing Status:   Future    Standing Expiration Date:   06/18/2020    Order Specific Question:   If indicated for the ordered procedure, I authorize the administration of a radiopharmaceutical per Radiology protocol    Answer:   Yes    Order Specific Question:   Preferred imaging location?    Answer:   [facility]    Order Specific Question:   Radiology Contrast Protocol - do NOT remove file path    Answer:   [email or web address]    All questions were answered. The patient knows to call the clinic with any problems, questions or concerns. The total time spent in the appointment was 20 minutes encounter with patients  including review of chart and various tests results, discussions about plan of care and coordination of care plan   [name], MD 06/19/2019 11:06 AM  INTERVAL HISTORY: Please see below for problem oriented charting. She returns for further follow-up She tolerated treatment well Her blood pressure control is satisfactory She complained of intermittent changes in her vision and is wondering whether she can interrupt treatment for eye surgery in the future She is also interested to get the Covid vaccine No new lymphadenopathy Denies recent infection, fever or chills  SUMMARY OF ONCOLOGIC HISTORY: Oncology History Overview Note  [employer] testing done 11-2015 on surgical path from 2014: MS stable TMB low 4 muts/mb ATM [id number] ERBB3 T389K E2H2 rearrangement exon 9 PPP2R1A P179R TP53 I195N    ER- APPROXIMATELY 25-35% STAINING IN NEOPLASTIC CELLS (INTERMEDIATE)  PR- APPROXIMATELY 25-35% STAINING IN NEOPLASTIC CELLS (STRONG)  Repeat biopsy 04/02/17: ER negative, Her 2 negative  Progressed on Doxil   Endometrial cancer ([place])  06/20/2012 Pathology Results   Biopsy positive for papillary serous carcinoma   06/20/2012 Genetic Testing   [employer] testing done 11-2015 on surgical path from 2014: MS stable TMB low 4 muts/mb ATM [id number] ERBB3 T389K E2H2 rearrangement exon 9 PPP2R1A P179R TP53 I195N    06/20/2012 Initial Diagnosis   Patient presented to PCP with intermittent vaginal bleeding since ~ Oct 2013, endometrial biopsy 06-20-12 with complex endometrial hyperplasia with atypia   06/26/2012 Imaging   Thickened endometrial lining in a postmenopausal patient experiencing vaginal bleeding. In the setting of post-menopausal bleeding, endometrial sampling is indicated to exclude carcinoma. No focal myometrial abnormalities are  seen.  Normal left ovary and non-visualized right ovary   07/30/2012 Surgery   Dr. Polly Cobia performed robotic  hysterectomy with bilateral salpingo-oophorectomy, bilateral pelvic lymph node dissection and periaortic lymph node dissection. Intraoperatively on frozen section, the patient was noted to have a large endometrial polyp with changes within the polyp consistent for high-grade malignancy, possibly papillary serous carcinoma. There is no obvious extrauterine disease noted.     07/30/2012 Pathology Results   [phone] SUPPLEMENTAL REPORT  THE ENDOMETRIAL CARCINOMA WAS ANALYZED FOR DNA MISMATCH REPAIR PROTEINS.IMMUNOHISTOCHEMICALLY, THE NEOPLASM RETAINED NUCLEAR EXPRESSION OF 4 GENE PRODUCTS, MLH1, MSH2, MSH6, AND PMS2, INVOLVED IN DNA MISMATCH REPAIR.POSITIVE AND NEGATIVE CONTROLS WORKED APPROPRIATELY.  PER REQUEST, AN ER AND PR ARE PERFORMED ON BLOCK 1I.  ER- APPROXIMATELY 25-35% STAINING IN NEOPLASTIC CELLS (INTERMEDIATE)  PR- APPROXIMATELY 25-35% STAINING IN NEOPLASTIC CELLS (STRONG)  ER AND PR PREDOMINANTLY SHOW STAINING IN THE SEROUS COMPONENT.  DIAGNOSIS  1. UTERUS, CERVIX WITH BILATERAL FALLOPIAN TUBES AND OVARIES,  HYSTERECTOMY AND BILATERAL SALPINGO-OOPHORECTOMY:  HIGH GRADE/POORLY DIFFERENTIATED ENDOMETRIAL ADENOCARCINOMA WITH SOLID AND SEROUS PAPILLARY COMPONENTS.  HISTOPATHOLOGIC TYPE:A VARIETY OF PATTERNS WERE PRESENT, ALL OF WHICH SHOULD BE CONSIDERED TO BE HIGH GRADE.SEROUS PAPILLARY CARCINOMA WAS SEEN OCCURRING ADJACENT TO SOLID ENDOMETRIAL CARCINOMA WITH MARKED ANAPLASIA AND GIANT CELLS. SIZE:TUMOR MEASURED AT LEAST 4.8 CM IN GREATEST HORIZONTAL DIMENSION.  GRADE: POORLY DIFFERENTIATED OR HIGH GRADE. DEPTH OF INVASION: NO DEFINITE MYOMETRIAL INVOLVEMENT WAS SEEN.THE TUMOR APPEARED CONFINED TO THE POLYP AS WELL AS SURFACE ENDOMETRIUM. WHERE MYOMETRIAL THICKNESS NOT APPLICABLE. SEROSAL INVOLVEMENT: NOT DEMONSTRATED.  ENDOCERVICAL INVOLVEMENT:NOT DEMONSTRATED. RESECTION MARGINS: FREE OF INVOLVEMENT. EXTRAUTERINE EXTENSION:NOT  DEMONSTRATED. ANGIOLYMPHATIC INVASION: NOT DEFINITELY SEEN. TOTAL NODES EXAMINED:22.  PELVIC NODES EXAMINED:20.  PELVIC NODES INVOLVED:0.  PARA-AORTIC NODES 2. EXAMINED:  PARA-AORTIC NODES 0. INVOLVED TNM STAGE: T1A N0 MX. AJCC STAGE GROUPING: IA. FIGO STAGE:IA.   08/26/2012 Imaging   No CT evidence for intra-abdominal or pelvic metastatic disease. Trace free pelvic fluid, presumably postoperative although the date of surgery is not documented in the electronic medical record.   08/26/2012 - 12/13/2012 Chemotherapy   She received 6 cycles of carbo/taxol    10/29/2012 - 11/27/2012 Radiation Therapy   May 27, June 5,  June 11, June 19, November 27, 2012: Proximal vagina 30 Gy in 5 fractions     01/17/2013 Imaging   1.  No evidence of recurrent or metastatic disease. 2.  No acute abnormality involving the abdomen or pelvis. 3.  Mild diffuse hepatic steatosis. 4.  Very small supraumbilical midline anterior abdominal wall hernia containing fat, unchanged   01/22/2014 Imaging   No evidence for metastatic or recurrent disease. 2. No bowel obstruction.  Normal appendix. 3. Small fat containing hernia, stable in appearance. 4. Status post hysterectomy and bilateral oophorectomy   02/19/2014 Imaging   No pulmonary lesions are identified. The abnormality on the chest x-ray is due to asymmetric left-sided sternoclavicular joint degenerative disease   10/12/2014 Imaging   New mild retroperitoneal lymphadenopathy in the left paraaortic region and proximal left common iliac chain, consistent with metastatic disease. No other sites of metastatic disease identified within the abdomen or pelvis.     11/27/2014 - 02/11/2015 Chemotherapy   She received 4 cycles of carbo/taxol    03/01/2015 PET scan   Single hypermetabolic small retroperitoneal lymph node along the aorta. 2. No evidence of metastatic disease otherwise in the abdomen or  pelvis. No evidence local recurrence. 3. Intensely hypermetabolic enlarged nodule adjacent to the RIGHT lobe of thyroid gland. This  presumably represents the biopsied lesion in clinician report which was found to be benign thyroid tissue.   04/05/2015 - 05/13/2015 Radiation Therapy   She received 50.4 gray in 28 fractions with simultaneous integrated boost to 56 gray   06/17/2015 Imaging   No acute process or evidence of metastatic disease in the abdomen or pelvis. Resolution of previously described retroperitoneal adenopathy. 2.  Possible constipation. 3. Atherosclerosis.   06/17/2015 Tumor Marker   Patient's tumor was tested for the following markers: CA125 Results of the tumor marker test revealed 33   08/12/2015 Tumor Marker   Patient's tumor was tested for the following markers: CA125 Results of the tumor marker test revealed 52   08/31/2015 PET scan   Development of right paratracheal hypermetabolic adenopathy, consistent with nodal metastasis. 2. The previously described isolated abdominal retroperitoneal hypermetabolic node has resolved. 3. Persistent hypermetabolic right thyroid nodule, per report previously biopsied. Correlate with those results. 4.  Possible constipation.   09/09/2015 -  Anti-estrogen oral therapy   She has been receiving alternative treatment between megace and tamoxifen   09/09/2015 Tumor Marker   Patient's tumor was tested for the following markers: CA125 Results of the tumor marker test revealed 37.8   09/20/2015 Procedure   Technically successful ultrasound-guided thyroid aspiration biopsy , dominant right nodule   09/20/2015 Pathology Results   THYROID, RIGHT, FINE NEEDLE ASPIRATION (SPECIMEN 1 OF 1 COLLECTED 09/20/15): FINDINGS CONSISTENT WITH BENIGN THYROID NODULE (BETHESDA CATEGORY II).   10/28/2015 Tumor Marker   Patient's tumor was tested for the following markers: CA125 Results of the tumor marker test revealed 53.2   11/04/2015 Imaging   Stable benign  right thyroid nodule   12/16/2015 Tumor Marker   Patient's tumor was tested for the following markers: CA125 Results of the tumor marker test revealed 38.1   03/22/2016 PET scan   Interval progression of hypermetabolic right paratracheal lymph node consistent with metastatic involvement. 2. Stable hypermetabolic right thyroid nodule. Reportedly this has been biopsied in the past.   03/23/2016 Tumor Marker   Patient's tumor was tested for the following markers: CA125 Results of the tumor marker test revealed 62.4   04/13/2016 - 06/01/2016 Radiation Therapy   She received 56 Gy to the chest in 28 fractions    05/09/2016 Imaging   No evidence of left lower extremity deep vein thrombosis. No evidence of a superficial thrombosis of the greater and lesser saphenous veins. Positive for thrombus noted in several varicosities of the calf. No evidence of Baker's cyst on the left.   05/17/2016 Tumor Marker   Patient's tumor was tested for the following markers: CA125 Results of the tumor marker test revealed 9   06/29/2016 Tumor Marker   Patient's tumor was tested for the following markers: CA125 Results of the tumor marker test revealed 5.9   08/04/2016 Tumor Marker   Patient's tumor was tested for the following markers: CA125 Results of the tumor marker test revealed 6.0   09/12/2016 PET scan   Complete metabolic response to therapy, with resolution of hypermetabolic mediastinal lymphadenopathy since prior exam. No residual or new metastatic disease identified. Stable hypermetabolic right thyroid lobe nodule, which was previously biopsied on 09/20/2015.   03/13/2017 PET scan   1. Solitary focus of recurrent right paratracheal hypermetabolic activity, with a 1.0 cm right lower paratracheal node having a maximum SUV of 9.0. Appearance compatible with recurrent malignancy. 2. Continued hypermetabolic right thyroid nodule, previously biopsied, presumed benign -correlate with prior biopsy results. 3.  Other imaging  findings of potential clinical significance: Aortic Atherosclerosis (ICD10-I70.0). Mild cardiomegaly. Prominent stool throughout the colon favors constipation.   04/02/2017 Pathology Results   FINE NEEDLE ASPIRATION, ENDOSCOPIC, (EBUS) 4 R NODE (SPECIMEN 1 OF 2 COLLECTED 04/02/17): MALIGNANT CELLS PRESENT, CONSISTENT WITH CARCINOMA. SEE COMMENT. COMMENT: THE MALIGNANT CELLS ARE POSITIVE FOR P53 AND NEGATIVE FOR ESTROGEN RECEPTOR AND TTF-1. THIS PROFILE IS NON-SPECIFIC, BUT THE P53 POSITIVE STAINING IS SUGGESTIVE OF GYNECOLOGIC PRIMARY.    04/11/2017 Procedure   Successful 8 French right internal jugular vein power port placement with its tip at the SVC/RA junction   04/12/2017 Imaging   Normal LV size with mild LV hypertrophy. EF 55-60%. Normal RV size and systolic function. Aortic valve sclerosis without significant stenosis.   04/18/2017 - 06/14/2017 Chemotherapy   The patient had chemotherapy with Doxil    07/10/2017 Imaging   - Left ventricle: The cavity size was normal. There was mild concentric hypertrophy. Systolic function was normal. The estimated ejection fraction was in the range of 55% to 60%. Wall motion was normal; there were no regional wall motion abnormalities. Doppler parameters are consistent with abnormal left ventricular relaxation (grade 1 diastolic dysfunction). - Aortic valve: Noncoronary cusp mobility was mildly restricted. - Mitral valve: There was mild regurgitation. - Left atrium: The atrium was mildly dilated. - Atrial septum: No defect or patent foramen ovale was identified.  Impressions:  - Compared to November 2018, global LV longitudinal strain remains normal (has increased)   07/11/2017 PET scan   Increased size and hypermetabolic activity of 3.3 cm right thyroid lobe nodule. Thyroid carcinoma cannot be excluded. Recommend repeat ultrasound guided fine needle aspiration to exclude thyroid carcinoma.  New adjacent hypermetabolic 10 mm right  supraclavicular lymph node, suspicious for lymph node metastasis.  Slight increase in size and hypermetabolic activity of solitary right paratracheal lymph node.  No evidence of abdominal or pelvic metastatic disease.   07/18/2017 Procedure   1. Technically successful ultrasound guided fine needle aspiration of indeterminate hypermetabolic right-sided thyroid nodule/mass. 2. Technically successful ultrasound-guided core needle biopsy of hypermetabolic right lower cervical lymph node.   07/18/2017 Pathology Results   THYROID, FINE NEEDLE ASPIRATION RIGHT (SPECIMEN 1 OF 1, COLLECTED ON 07/18/2017): ATYPIA OF UNDETERMINED SIGNIFICANCE OR FOLLICULAR LESION OF UNDETERMINED SIGNIFICANCE (BETHESDA CATEGORY III). SEE COMMENT. COMMENT: THE SPECIMEN CONSISTS OF SMALL AND MEDIUM SIZED GROUPS OF FOLLICULAR EPITHELIAL CELLS WITH MILD CYTOLOGIC ATYPIA INCLUDING NUCLEAR ENLARGEMENT AND HURTHLE CELL CHANGE. SOME GROUPS ARE ARRANGED AS MICROFOLLICLES. THERE IS MINIMAL BACKGROUND COLLOID. BASED ON THESE FEATURES, A FOLLICULAR LESION/NEOPLASM CAN NOT BE ENTIRELY RULED OUT. A SPECIMEN WILL BE SENT FOR AFIRMA TESTING.   07/19/2017 Pathology Results   Lymph node, needle/core biopsy - METASTATIC PAPILLARY SEROUS CARCINOMA. - SEE COMMENT. Microscopic Comment Dr. Vicente Males has reviewed the case and concurs with this interpretation   10/15/2017 PET scan   No new or progressive disease. No evidence of abdominal or pelvic metastatic disease.  Stable hypermetabolic right thyroid lobe nodule and adjacent right supraclavicular lymph node.  Decreased size and hypermetabolic activity of solitary right paratracheal lymph node.   01/23/2018 PET scan   1. Overall, no significant change from PET-CT of 3 months ago. There is persistent hypermetabolic activity at the right thoracic inlet and within a right paratracheal mediastinal node. The nodes have not significantly changed in size, although the metabolic activity has  minimally increased over this interval. It is uncertain how much of the thoracic inlet metabolic activity is attributed to the thyroid nodule versus  adjacent cervical lymph nodes, both previously biopsied. 2. No new hypermetabolic activity within the neck, chest, abdomen or pelvis. No evidence of local recurrence in the pelvis. 3. Stable probable radiation changes in the right lung.   04/16/2018 PET scan   1. Interval increase in metabolic activity of the RIGHT supraclavicular node and RIGHT lower paratracheal mediastinal node with minimal change in size. Findings consistent with persistent and mildly progressed metastatic adenopathy. 2. New hypermetabolic small lymph node in the RIGHT lower paratracheal nodal station adjacent to the previously followed node. 3. No evidence of local recurrence in the pelvis or new disease elsewhere.   07/30/2018 PET scan   1. Mild interval progression of right supraclavicular, mediastinal, and right hilar hypermetabolic metastatic disease. There is a new hypermetabolic lymph node in the subcarinal station on today's study. 2. No hypermetabolic disease in the abdomen or pelvis to suggest recurrent/metastases. 3.  Aortic Atherosclerois (ICD10-170.0)    08/16/2018 -  Chemotherapy   The patient had Lenvima since 3/6 and pembrolizumab since 3/13 for chemotherapy treatment. Michel Santee was held temporarily due to infection and severe hypertension   10/31/2018 PET scan   Partial response to therapy, with decreased hypermetabolic lymphadenopathy in mediastinum and right hilar region.  No significant change in hypermetabolic lymphadenopathy in right inferior neck.  No new or progressive metastatic disease identified. No evidence of recurrent or metastatic carcinoma in the pelvis or abdomen   02/04/2019 PET scan   1. Decrease in metabolic activity of RIGHT supraclavicular node and RIGHT paratracheal lymph nodes. 2. Persistent activity in the RIGHT thyroid nodule  unchanged. 3. Diffuse activity through the esophagus is favored esophagitis. No change. 4. Scattered foci of intense metabolic activity associated the bowel without focal lesion on CT favored benign. 5. No evidence of peritoneal metastasis. 6. No evidence of metastatic adenopathy in the abdomen pelvis. 7. No evidence of local pelvic sidewall recurrence.   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      REVIEW OF SYSTEMS:   Constitutional: Denies fevers, chills or abnormal weight loss Ears, nose, mouth, throat, and face: Denies mucositis or sore throat Respiratory: Denies cough, dyspnea or wheezes Cardiovascular: Denies palpitation, chest discomfort or lower extremity swelling Gastrointestinal:  Denies nausea, heartburn or change in bowel habits Skin: Denies abnormal skin rashes Lymphatics: Denies new lymphadenopathy or easy bruising Neurological:Denies numbness, tingling or new weaknesses Behavioral/Psych: Mood is stable, no new changes  All other systems were reviewed with the patient and are negative.  I have reviewed the past medical history, past surgical history, social history and family history with the patient and they are unchanged from previous note.  ALLERGIES:  has No Known Allergies.  MEDICATIONS:  Current Outpatient Medications  Medication Sig Dispense Refill  . amLODipine (NORVASC) 10 MG tablet TAKE 1 TABLET BY MOUTH DAILY. DECREASE SIMVASTATIN TO 20MG DAILY WHILE ON AMLODIPINE PER MD 90 tablet 1  . Calcium  Carb-Cholecalciferol 500-600 MG-UNIT TABS Take 1 tablet by mouth daily.     . Cholecalciferol (VITAMIN D3) 2000 units TABS Take 2,000 Units by mouth daily.     Marland Kitchen  hydrochlorothiazide (HYDRODIURIL) 50 MG tablet TAKE 1 TABLET (50 MG TOTAL) BY MOUTH DAILY. 90 tablet 11  . lenvatinib 10 mg daily dose (LENVIMA, 10 MG DAILY DOSE,) capsule Take 1 capsule (10 mg total) by mouth daily. 30 capsule 11  . levothyroxine (SYNTHROID) 75 MCG tablet Take 1 tablet (75 mcg total) by mouth daily before breakfast. 30 tablet 2  . LORazepam (ATIVAN) 0.5 MG tablet Take 0.5 mg by mouth 2 (two) times daily as needed.  0  . losartan (COZAAR) 50 MG tablet Take 1 tablet (50 mg total) by mouth daily. 90 tablet 11  . metoprolol tartrate (LOPRESSOR) 25 MG tablet Take 1 tablet (25 mg total) by mouth 2 (two) times daily. 180 tablet 3  . ondansetron (ZOFRAN) 8 MG tablet Take 1 tablet (8 mg total) by mouth every 8 (eight) hours as needed for nausea. 30 tablet 3  . simvastatin (ZOCOR) 40 MG tablet Take 40 mg by mouth every evening.    . tacrolimus (PROTOPIC) 0.1 % ointment Apply 1 application topically daily as needed (rash).     . venlafaxine XR (EFFEXOR-XR) 75 MG 24 hr capsule Take 75 mg by mouth daily with breakfast.      No current facility-administered medications for this visit.    PHYSICAL EXAMINATION: ECOG PERFORMANCE STATUS: 1 - Symptomatic but completely ambulatory  Vitals:   06/19/19 1051  BP: 127/64  Pulse: 71  Resp: 18  Temp: (!) 97.4 F (36.3 C)  SpO2: 98%   Filed Weights   06/19/19 1051  Weight: 160 lb 9.6 oz (72.8 kg)    GENERAL:alert, no distress and comfortable SKIN: skin color, texture, turgor are normal, no rashes or significant lesions EYES: normal, Conjunctiva are pink and non-injected, sclera clear OROPHARYNX:no exudate, no erythema and lips, buccal mucosa, and tongue normal  NECK: supple, thyroid normal size, non-tender, without nodularity LYMPH:  no palpable lymphadenopathy in the cervical,  axillary or inguinal LUNGS: clear to auscultation and percussion with normal breathing effort HEART: regular rate & rhythm and no murmurs and no lower extremity edema ABDOMEN:abdomen soft, non-tender and normal bowel sounds Musculoskeletal:no cyanosis of digits and no clubbing  NEURO: alert & oriented x 3 with fluent speech, no focal motor/sensory deficits  LABORATORY DATA:  I have reviewed the data as listed    Component Value Date/Time   NA 138 05/29/2019 0855   NA 141 05/17/2017 0957   K 3.6 05/29/2019 0855   K 3.6 05/17/2017 0957   CL 102 05/29/2019 0855   CL 105 11/19/2012 0848   CO2 26 05/29/2019 0855   CO2 25 05/17/2017 0957   GLUCOSE 101 (H) 05/29/2019 0855   GLUCOSE 85 05/17/2017 0957   GLUCOSE 122 (H) 11/19/2012 0848   BUN 27 (H) 05/29/2019 0855   BUN 15.2 05/17/2017 0957   CREATININE 0.88 05/29/2019 0855   CREATININE 0.8 05/17/2017 0957   CALCIUM 9.1 05/29/2019 0855   CALCIUM 9.6 05/17/2017 0957   PROT 7.2 05/29/2019 0855   PROT 7.0 05/17/2017 0957   ALBUMIN 3.9 05/29/2019 0855   ALBUMIN 3.9 05/17/2017 0957   AST 26 05/29/2019 0855   AST 25 05/17/2017 0957   ALT 20 05/29/2019 0855   ALT 18 05/17/2017 0957   ALKPHOS 52 05/29/2019 0855   ALKPHOS 57 05/17/2017 0957   BILITOT 0.5 05/29/2019 0855   BILITOT 0.47 05/17/2017 0957   GFRNONAA >60 05/29/2019 0855   GFRAA >60 05/29/2019 0855    No results found for: SPEP, UPEP  Lab Results  Component Value Date   WBC 3.4 (L) 06/19/2019   NEUTROABS 2.1 06/19/2019   HGB 10.6 (L) 06/19/2019   HCT 31.7 (L) 06/19/2019   MCV 98.8 06/19/2019   PLT 169 06/19/2019      Chemistry      Component Value Date/Time   NA 138 05/29/2019 0855   NA 141 05/17/2017 0957   K 3.6 05/29/2019 0855   K 3.6 05/17/2017 0957   CL 102 05/29/2019 0855   CL 105 11/19/2012 0848   CO2 26 05/29/2019 0855   CO2 25 05/17/2017 0957   BUN 27 (H) 05/29/2019 0855   BUN 15.2 05/17/2017 0957   CREATININE 0.88 05/29/2019 0855   CREATININE  0.8 05/17/2017 0957   GLU 158 (H) 01/14/2015 1035      Component Value Date/Time   CALCIUM 9.1 05/29/2019 0855   CALCIUM 9.6 05/17/2017 0957   ALKPHOS 52 05/29/2019 0855   ALKPHOS 57 05/17/2017 0957   AST 26 05/29/2019 0855   AST 25 05/17/2017 0957   ALT 20 05/29/2019 0855   ALT 18 05/17/2017 0957   BILITOT 0.5 05/29/2019 0855   BILITOT 0.47 05/17/2017 0957

## 2019-06-19 NOTE — Assessment & Plan Note (Signed)
She has intermittent proteinuria even though her blood pressure remains well controlled She will continue aggressive blood pressure management 

## 2019-06-24 MED FILL — AMLODIPINE BESYLATE 10 MG T: 10 | 90 days supply | Qty: 90 | Fill #1

## 2019-07-01 MED FILL — METOPROLOL TARTRATE 25 MG T: 25 | 90 days supply | Qty: 180 | Fill #2

## 2019-07-10 ENCOUNTER — Inpatient Hospital Stay: Payer: 59 | Attending: Hematology and Oncology

## 2019-07-10 ENCOUNTER — Inpatient Hospital Stay: Payer: 59

## 2019-07-10 ENCOUNTER — Other Ambulatory Visit: Payer: Self-pay

## 2019-07-10 VITALS — BP 127/68 | HR 71 | Temp 97.8°F | Resp 17

## 2019-07-10 DIAGNOSIS — R809 Proteinuria, unspecified: Secondary | ICD-10-CM | POA: Insufficient documentation

## 2019-07-10 DIAGNOSIS — Z5112 Encounter for antineoplastic immunotherapy: Secondary | ICD-10-CM | POA: Insufficient documentation

## 2019-07-10 DIAGNOSIS — I10 Essential (primary) hypertension: Secondary | ICD-10-CM | POA: Insufficient documentation

## 2019-07-10 DIAGNOSIS — Z9221 Personal history of antineoplastic chemotherapy: Secondary | ICD-10-CM | POA: Diagnosis not present

## 2019-07-10 DIAGNOSIS — C541 Malignant neoplasm of endometrium: Secondary | ICD-10-CM

## 2019-07-10 DIAGNOSIS — C770 Secondary and unspecified malignant neoplasm of lymph nodes of head, face and neck: Secondary | ICD-10-CM | POA: Insufficient documentation

## 2019-07-10 DIAGNOSIS — D61818 Other pancytopenia: Secondary | ICD-10-CM | POA: Diagnosis not present

## 2019-07-10 DIAGNOSIS — K2100 Gastro-esophageal reflux disease with esophagitis, without bleeding: Secondary | ICD-10-CM | POA: Insufficient documentation

## 2019-07-10 DIAGNOSIS — Z95828 Presence of other vascular implants and grafts: Secondary | ICD-10-CM

## 2019-07-10 DIAGNOSIS — Z7189 Other specified counseling: Secondary | ICD-10-CM

## 2019-07-10 DIAGNOSIS — Z923 Personal history of irradiation: Secondary | ICD-10-CM | POA: Diagnosis not present

## 2019-07-10 LAB — CBC WITH DIFFERENTIAL/PLATELET
Abs Immature Granulocytes: 0.01 10*3/uL (ref 0.00–0.07)
Basophils Absolute: 0.1 10*3/uL (ref 0.0–0.1)
Basophils Relative: 2 %
Eosinophils Absolute: 0.2 10*3/uL (ref 0.0–0.5)
Eosinophils Relative: 5 %
HCT: 32.5 % — ABNORMAL LOW (ref 36.0–46.0)
Hemoglobin: 10.8 g/dL — ABNORMAL LOW (ref 12.0–15.0)
Immature Granulocytes: 0 %
Lymphocytes Relative: 23 %
Lymphs Abs: 0.9 10*3/uL (ref 0.7–4.0)
MCH: 32.3 pg (ref 26.0–34.0)
MCHC: 33.2 g/dL (ref 30.0–36.0)
MCV: 97.3 fL (ref 80.0–100.0)
Monocytes Absolute: 0.3 10*3/uL (ref 0.1–1.0)
Monocytes Relative: 8 %
Neutro Abs: 2.5 10*3/uL (ref 1.7–7.7)
Neutrophils Relative %: 62 %
Platelets: 170 10*3/uL (ref 150–400)
RBC: 3.34 MIL/uL — ABNORMAL LOW (ref 3.87–5.11)
RDW: 12.9 % (ref 11.5–15.5)
WBC: 4 10*3/uL (ref 4.0–10.5)
nRBC: 0 % (ref 0.0–0.2)

## 2019-07-10 LAB — CMP (CANCER CENTER ONLY)
ALT: 20 U/L (ref 0–44)
AST: 23 U/L (ref 15–41)
Albumin: 3.8 g/dL (ref 3.5–5.0)
Alkaline Phosphatase: 50 U/L (ref 38–126)
Anion gap: 9 (ref 5–15)
BUN: 26 mg/dL — ABNORMAL HIGH (ref 8–23)
CO2: 27 mmol/L (ref 22–32)
Calcium: 9.4 mg/dL (ref 8.9–10.3)
Chloride: 102 mmol/L (ref 98–111)
Creatinine: 0.83 mg/dL (ref 0.44–1.00)
GFR, Est AFR Am: >60 mL/min (ref >60)
GFR, Estimated: >60 mL/min (ref >60)
Glucose, Bld: 67 mg/dL — ABNORMAL LOW (ref 70–99)
Potassium: 3.8 mmol/L (ref 3.5–5.1)
Sodium: 138 mmol/L (ref 135–145)
Total Bilirubin: 0.4 mg/dL (ref 0.3–1.2)
Total Protein: 7 g/dL (ref 6.5–8.1)

## 2019-07-10 LAB — TSH: TSH: 3.797 u[IU]/mL (ref 0.308–3.960)

## 2019-07-10 LAB — TOTAL PROTEIN, URINE DIPSTICK

## 2019-07-10 MED ORDER — SODIUM CHLORIDE 0.9 % IV SOLN
200.0000 mg | Freq: Once | INTRAVENOUS | Status: AC
  Administered 2019-07-10: 200 mg via INTRAVENOUS
  Filled 2019-07-10: qty 8

## 2019-07-10 MED ORDER — SODIUM CHLORIDE 0.9 % IV SOLN
Freq: Once | INTRAVENOUS | Status: AC
  Filled 2019-07-10: qty 250

## 2019-07-10 MED ORDER — SODIUM CHLORIDE 0.9% FLUSH
10.0000 mL | Freq: Once | INTRAVENOUS | Status: AC
  Administered 2019-07-10: 13:00:00 10 mL
  Filled 2019-07-10: qty 10

## 2019-07-10 MED ORDER — HEPARIN SOD (PORK) LOCK FLUSH 100 UNIT/ML IV SOLN
500.0000 [IU] | Freq: Once | INTRAVENOUS | Status: AC | PRN
  Administered 2019-07-10: 500 [IU]
  Filled 2019-07-10: qty 5

## 2019-07-10 MED ORDER — SODIUM CHLORIDE 0.9% FLUSH
10.0000 mL | INTRAVENOUS | Status: DC | PRN
  Administered 2019-07-10: 10 mL
  Filled 2019-07-10: qty 10

## 2019-07-10 NOTE — Patient Instructions (Signed)

## 2019-07-10 NOTE — Patient Instructions (Signed)
[facility] Discharge Instructions for Patients Receiving Chemotherapy  Today you received the following chemotherapy agent: Keytruda.  To help prevent nausea and vomiting after your treatment, we encourage you to take your nausea medication as directed.   If you develop nausea and vomiting that is not controlled by your nausea medication, call the clinic.   BELOW ARE SYMPTOMS THAT SHOULD BE REPORTED IMMEDIATELY:  *FEVER GREATER THAN 100.5 F  *CHILLS WITH OR WITHOUT FEVER  NAUSEA AND VOMITING THAT IS NOT CONTROLLED WITH YOUR NAUSEA MEDICATION  *UNUSUAL SHORTNESS OF BREATH  *UNUSUAL BRUISING OR BLEEDING  TENDERNESS IN MOUTH AND THROAT WITH OR WITHOUT PRESENCE OF ULCERS  *URINARY PROBLEMS  *BOWEL PROBLEMS  UNUSUAL RASH Items with * indicate a potential emergency and should be followed up as soon as possible.  Feel free to call the clinic should you have any questions or concerns. The clinic phone number is [phone].  Please show the CHEMO ALERT CARD at check-in to the Emergency Department and triage nurse.   

## 2019-07-14 DIAGNOSIS — F064 Anxiety disorder due to known physiological condition: Secondary | ICD-10-CM | POA: Diagnosis not present

## 2019-07-14 MED FILL — VENLAFAXINE HCL ER 150 MG C: 150 | 90 days supply | Qty: 180 | Fill #0

## 2019-07-18 MED FILL — LOSARTAN POTASSIUM 50 MG TA: 50 | 30 days supply | Qty: 30 | Fill #10

## 2019-07-18 MED FILL — LENVIMA 10 MG DAILY DOSE: 10 | 30 days supply | Qty: 30 | Fill #8

## 2019-07-21 ENCOUNTER — Other Ambulatory Visit (HOSPITAL_COMMUNITY): Payer: Self-pay | Admitting: Family Medicine

## 2019-07-21 DIAGNOSIS — M859 Disorder of bone density and structure, unspecified: Secondary | ICD-10-CM | POA: Diagnosis not present

## 2019-07-21 DIAGNOSIS — Z0000 Encounter for general adult medical examination without abnormal findings: Secondary | ICD-10-CM | POA: Diagnosis not present

## 2019-07-21 DIAGNOSIS — Z6827 Body mass index (BMI) 27.0-27.9, adult: Secondary | ICD-10-CM | POA: Diagnosis not present

## 2019-07-21 DIAGNOSIS — C772 Secondary and unspecified malignant neoplasm of intra-abdominal lymph nodes: Secondary | ICD-10-CM | POA: Diagnosis not present

## 2019-07-21 DIAGNOSIS — C541 Malignant neoplasm of endometrium: Secondary | ICD-10-CM | POA: Diagnosis not present

## 2019-07-21 DIAGNOSIS — E663 Overweight: Secondary | ICD-10-CM | POA: Diagnosis not present

## 2019-07-21 DIAGNOSIS — I10 Essential (primary) hypertension: Secondary | ICD-10-CM | POA: Diagnosis not present

## 2019-07-21 DIAGNOSIS — E041 Nontoxic single thyroid nodule: Secondary | ICD-10-CM | POA: Diagnosis not present

## 2019-07-21 DIAGNOSIS — E7800 Pure hypercholesterolemia, unspecified: Secondary | ICD-10-CM | POA: Diagnosis not present

## 2019-07-30 ENCOUNTER — Other Ambulatory Visit: Payer: Self-pay

## 2019-07-30 ENCOUNTER — Ambulatory Visit (HOSPITAL_COMMUNITY)
Admission: RE | Admit: 2019-07-30 | Discharge: 2019-07-30 | Disposition: A | Discharge status: Home / Self Care | Payer: 59 | Source: Ambulatory Visit | Attending: Hematology and Oncology | Admitting: Hematology and Oncology

## 2019-07-30 DIAGNOSIS — C55 Malignant neoplasm of uterus, part unspecified: Secondary | ICD-10-CM | POA: Diagnosis not present

## 2019-07-30 DIAGNOSIS — C772 Secondary and unspecified malignant neoplasm of intra-abdominal lymph nodes: Secondary | ICD-10-CM | POA: Insufficient documentation

## 2019-07-30 DIAGNOSIS — C770 Secondary and unspecified malignant neoplasm of lymph nodes of head, face and neck: Secondary | ICD-10-CM | POA: Diagnosis not present

## 2019-07-30 DIAGNOSIS — C541 Malignant neoplasm of endometrium: Secondary | ICD-10-CM | POA: Insufficient documentation

## 2019-07-30 LAB — GLUCOSE, CAPILLARY: Glucose-Capillary: 90 mg/dL (ref 70–99)

## 2019-07-30 MED ORDER — FLUDEOXYGLUCOSE F - 18 (FDG) INJECTION
8.0000 | Freq: Once | INTRAVENOUS | Status: AC
  Administered 2019-07-30: 8 via INTRAVENOUS

## 2019-07-31 ENCOUNTER — Inpatient Hospital Stay: Payer: 59

## 2019-07-31 ENCOUNTER — Other Ambulatory Visit: Payer: Self-pay | Admitting: Hematology and Oncology

## 2019-07-31 ENCOUNTER — Other Ambulatory Visit: Payer: Self-pay

## 2019-07-31 ENCOUNTER — Inpatient Hospital Stay (HOSPITAL_BASED_OUTPATIENT_CLINIC_OR_DEPARTMENT_OTHER): Payer: 59 | Admitting: Hematology and Oncology

## 2019-07-31 DIAGNOSIS — Z95828 Presence of other vascular implants and grafts: Secondary | ICD-10-CM

## 2019-07-31 DIAGNOSIS — C541 Malignant neoplasm of endometrium: Secondary | ICD-10-CM

## 2019-07-31 DIAGNOSIS — R809 Proteinuria, unspecified: Secondary | ICD-10-CM | POA: Diagnosis not present

## 2019-07-31 DIAGNOSIS — C770 Secondary and unspecified malignant neoplasm of lymph nodes of head, face and neck: Secondary | ICD-10-CM

## 2019-07-31 DIAGNOSIS — Z923 Personal history of irradiation: Secondary | ICD-10-CM | POA: Diagnosis not present

## 2019-07-31 DIAGNOSIS — Z5112 Encounter for antineoplastic immunotherapy: Secondary | ICD-10-CM | POA: Diagnosis not present

## 2019-07-31 DIAGNOSIS — D61818 Other pancytopenia: Secondary | ICD-10-CM | POA: Diagnosis not present

## 2019-07-31 DIAGNOSIS — Z9221 Personal history of antineoplastic chemotherapy: Secondary | ICD-10-CM | POA: Diagnosis not present

## 2019-07-31 DIAGNOSIS — Z7189 Other specified counseling: Secondary | ICD-10-CM

## 2019-07-31 DIAGNOSIS — I10 Essential (primary) hypertension: Secondary | ICD-10-CM | POA: Diagnosis not present

## 2019-07-31 DIAGNOSIS — K2100 Gastro-esophageal reflux disease with esophagitis, without bleeding: Secondary | ICD-10-CM | POA: Diagnosis not present

## 2019-07-31 LAB — COMPREHENSIVE METABOLIC PANEL
ALT: 17 U/L (ref 0–44)
AST: 19 U/L (ref 15–41)
Albumin: 3.7 g/dL (ref 3.5–5.0)
Alkaline Phosphatase: 58 U/L (ref 38–126)
Anion gap: 11 (ref 5–15)
BUN: 25 mg/dL — ABNORMAL HIGH (ref 8–23)
CO2: 27 mmol/L (ref 22–32)
Calcium: 9.2 mg/dL (ref 8.9–10.3)
Chloride: 103 mmol/L (ref 98–111)
Creatinine, Ser: 0.87 mg/dL (ref 0.44–1.00)
GFR calc Af Amer: >60 mL/min (ref >60)
GFR calc non Af Amer: >60 mL/min (ref >60)
Glucose, Bld: 99 mg/dL (ref 70–99)
Potassium: 3.8 mmol/L (ref 3.5–5.1)
Sodium: 141 mmol/L (ref 135–145)
Total Bilirubin: 0.5 mg/dL (ref 0.3–1.2)
Total Protein: 7.1 g/dL (ref 6.5–8.1)

## 2019-07-31 LAB — CBC WITH DIFFERENTIAL/PLATELET
Abs Immature Granulocytes: 0.01 10*3/uL (ref 0.00–0.07)
Basophils Absolute: 0 10*3/uL (ref 0.0–0.1)
Basophils Relative: 1 %
Eosinophils Absolute: 0.2 10*3/uL (ref 0.0–0.5)
Eosinophils Relative: 5 %
HCT: 32.1 % — ABNORMAL LOW (ref 36.0–46.0)
Hemoglobin: 10.5 g/dL — ABNORMAL LOW (ref 12.0–15.0)
Immature Granulocytes: 0 %
Lymphocytes Relative: 18 %
Lymphs Abs: 0.7 10*3/uL (ref 0.7–4.0)
MCH: 31.7 pg (ref 26.0–34.0)
MCHC: 32.7 g/dL (ref 30.0–36.0)
MCV: 97 fL (ref 80.0–100.0)
Monocytes Absolute: 0.2 10*3/uL (ref 0.1–1.0)
Monocytes Relative: 6 %
Neutro Abs: 2.8 10*3/uL (ref 1.7–7.7)
Neutrophils Relative %: 70 %
Platelets: 195 10*3/uL (ref 150–400)
RBC: 3.31 MIL/uL — ABNORMAL LOW (ref 3.87–5.11)
RDW: 12.8 % (ref 11.5–15.5)
WBC: 3.9 10*3/uL — ABNORMAL LOW (ref 4.0–10.5)
nRBC: 0 % (ref 0.0–0.2)

## 2019-07-31 LAB — TOTAL PROTEIN, URINE DIPSTICK: Protein, ur: 30 mg/dL — AB

## 2019-07-31 LAB — TSH: TSH: 4.051 u[IU]/mL — ABNORMAL HIGH (ref 0.308–3.960)

## 2019-07-31 MED ORDER — PANTOPRAZOLE SODIUM 40 MG PO TBEC: 40.0000 mg | DELAYED_RELEASE_TABLET | Freq: Every day | ORAL | 3 refills | Status: DC

## 2019-07-31 MED ORDER — SODIUM CHLORIDE 0.9 % IV SOLN
Freq: Once | INTRAVENOUS | Status: AC
  Filled 2019-07-31: qty 250

## 2019-07-31 MED ORDER — HEPARIN SOD (PORK) LOCK FLUSH 100 UNIT/ML IV SOLN
500.0000 [IU] | Freq: Once | INTRAVENOUS | Status: AC | PRN
  Administered 2019-07-31: 500 [IU]
  Filled 2019-07-31: qty 5

## 2019-07-31 MED ORDER — SODIUM CHLORIDE 0.9% FLUSH
10.0000 mL | Freq: Once | INTRAVENOUS | Status: AC
  Administered 2019-07-31: 10 mL
  Filled 2019-07-31: qty 10

## 2019-07-31 MED ORDER — SODIUM CHLORIDE 0.9 % IV SOLN
200.0000 mg | Freq: Once | INTRAVENOUS | Status: AC
  Administered 2019-07-31: 200 mg via INTRAVENOUS
  Filled 2019-07-31: qty 8

## 2019-07-31 MED ORDER — SODIUM CHLORIDE 0.9% FLUSH
10.0000 mL | INTRAVENOUS | Status: DC | PRN
  Administered 2019-07-31: 10 mL
  Filled 2019-07-31: qty 10

## 2019-07-31 MED FILL — PANTOPRAZOLE SOD DR 40 MG T: 40 | 90 days supply | Qty: 90 | Fill #0

## 2019-07-31 NOTE — Patient Instructions (Signed)
[facility] Discharge Instructions for Patients Receiving Chemotherapy  Today you received the following chemotherapy agent: Keytruda.  To help prevent nausea and vomiting after your treatment, we encourage you to take your nausea medication as directed.   If you develop nausea and vomiting that is not controlled by your nausea medication, call the clinic.   BELOW ARE SYMPTOMS THAT SHOULD BE REPORTED IMMEDIATELY:  *FEVER GREATER THAN 100.5 F  *CHILLS WITH OR WITHOUT FEVER  NAUSEA AND VOMITING THAT IS NOT CONTROLLED WITH YOUR NAUSEA MEDICATION  *UNUSUAL SHORTNESS OF BREATH  *UNUSUAL BRUISING OR BLEEDING  TENDERNESS IN MOUTH AND THROAT WITH OR WITHOUT PRESENCE OF ULCERS  *URINARY PROBLEMS  *BOWEL PROBLEMS  UNUSUAL RASH Items with * indicate a potential emergency and should be followed up as soon as possible.  Feel free to call the clinic should you have any questions or concerns. The clinic phone number is [phone].  Please show the CHEMO ALERT CARD at check-in to the Emergency Department and triage nurse.   

## 2019-08-01 ENCOUNTER — Encounter: Payer: Self-pay | Admitting: Hematology and Oncology

## 2019-08-01 ENCOUNTER — Telehealth: Payer: Self-pay | Admitting: Hematology and Oncology

## 2019-08-01 NOTE — Assessment & Plan Note (Signed)
She has intermittent symptoms of reflux and PET CT scan show signs of esophagitis I recommend high-dose proton pump inhibitor with pantoprazole daily and she agreed

## 2019-08-01 NOTE — Progress Notes (Signed)
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) The supraclavicular lymph node is not evident on recent PET scan Observe only  Esophagitis, reflux She has intermittent symptoms of reflux and PET CT scan show signs of esophagitis I recommend high-dose proton pump inhibitor with pantoprazole daily and she agreed  Essential hypertension She has intermittent proteinuria even though her blood pressure remains well controlled She will continue aggressive blood pressure management  Pancytopenia, acquired ([place]) This is due to side effects of treatment She is not symptomatic Observe only   No orders of the defined types were placed in this encounter.   All questions were answered. The patient knows to call the clinic with any problems, questions or concerns. The total time spent in the appointment was 30 minutes encounter with patients including review of chart and various tests results, discussions about plan of care and coordination of care plan   [name], MD 08/01/2019 11:15 AM  INTERVAL HISTORY: Please see below for problem oriented charting. She returns for further follow-up and review imaging study She had recent Covid vaccination prior to the PET CT scan which would explain some of the abnormalities seen She does complain of intermittent reflux but otherwise tolerated treatment very well She had  recent intermittent loose stool No other new side effects from treatment so far No recent infection, fever or chills Her blood pressure control at home is satisfactory SUMMARY OF ONCOLOGIC HISTORY: Oncology History Overview Note  [employer] testing done 11-2015 on surgical path from 2014: MS stable TMB low 4 muts/mb ATM [id number] ERBB3 T389K E2H2 rearrangement exon 9 PPP2R1A P179R TP53 I195N    ER- APPROXIMATELY 25-35% STAINING IN NEOPLASTIC CELLS (INTERMEDIATE)  PR- APPROXIMATELY 25-35% STAINING IN NEOPLASTIC CELLS (STRONG)  Repeat biopsy 04/02/17: ER negative, Her 2 negative  Progressed on Doxil   Endometrial cancer ([place])  06/20/2012 Pathology Results   Biopsy positive for papillary serous carcinoma   06/20/2012 Genetic Testing   [employer] testing done 11-2015 on surgical path from 2014: MS stable TMB low 4 muts/mb ATM [id number] ERBB3 T389K E2H2 rearrangement exon 9 PPP2R1A P179R TP53 I195N    06/20/2012 Initial Diagnosis   Patient presented to PCP with intermittent vaginal bleeding since ~ Oct 2013, endometrial biopsy 06-20-12 with complex endometrial hyperplasia with atypia   06/26/2012 Imaging   Thickened endometrial lining in a postmenopausal patient experiencing vaginal bleeding. In the setting of post-menopausal bleeding, endometrial sampling is indicated to exclude carcinoma. No focal myometrial abnormalities are seen.  Normal left ovary and non-visualized right ovary   07/30/2012 Surgery   Dr. Polly Cobia performed robotic hysterectomy with bilateral salpingo-oophorectomy, bilateral pelvic lymph node dissection and periaortic lymph node dissection. Intraoperatively on frozen section, the patient was noted to have a large endometrial polyp with changes within the polyp consistent for high-grade malignancy, possibly papillary serous carcinoma. There is no obvious extrauterine disease noted.     07/30/2012 Pathology Results    [phone] SUPPLEMENTAL REPORT  THE ENDOMETRIAL CARCINOMA WAS ANALYZED FOR DNA MISMATCH REPAIR PROTEINS.IMMUNOHISTOCHEMICALLY, THE NEOPLASM RETAINED NUCLEAR EXPRESSION OF 4 GENE PRODUCTS, MLH1, MSH2, MSH6, AND PMS2, INVOLVED IN DNA MISMATCH REPAIR.POSITIVE AND NEGATIVE CONTROLS WORKED APPROPRIATELY.  PER REQUEST, AN ER AND PR ARE PERFORMED ON BLOCK 1I.  ER- APPROXIMATELY 25-35% STAINING IN NEOPLASTIC CELLS (INTERMEDIATE)  PR- APPROXIMATELY 25-35% STAINING IN NEOPLASTIC CELLS (STRONG)  ER AND PR PREDOMINANTLY SHOW STAINING IN THE SEROUS COMPONENT.  DIAGNOSIS  1. UTERUS, CERVIX WITH BILATERAL FALLOPIAN TUBES AND OVARIES,  HYSTERECTOMY AND BILATERAL SALPINGO-OOPHORECTOMY:  HIGH GRADE/POORLY DIFFERENTIATED ENDOMETRIAL ADENOCARCINOMA WITH SOLID AND SEROUS PAPILLARY COMPONENTS.  HISTOPATHOLOGIC TYPE:A VARIETY OF PATTERNS WERE PRESENT, ALL OF WHICH SHOULD BE CONSIDERED TO BE HIGH GRADE.SEROUS PAPILLARY CARCINOMA WAS SEEN OCCURRING ADJACENT TO SOLID ENDOMETRIAL CARCINOMA WITH MARKED ANAPLASIA AND GIANT CELLS. SIZE:TUMOR MEASURED AT LEAST 4.8 CM IN GREATEST HORIZONTAL DIMENSION.  GRADE: POORLY DIFFERENTIATED OR HIGH GRADE. DEPTH OF INVASION: NO DEFINITE MYOMETRIAL INVOLVEMENT WAS SEEN.THE TUMOR APPEARED CONFINED TO THE POLYP AS WELL AS SURFACE ENDOMETRIUM. WHERE MYOMETRIAL THICKNESS NOT APPLICABLE. SEROSAL INVOLVEMENT: NOT DEMONSTRATED.  ENDOCERVICAL INVOLVEMENT:NOT DEMONSTRATED. RESECTION MARGINS: FREE OF INVOLVEMENT. EXTRAUTERINE EXTENSION:NOT DEMONSTRATED. ANGIOLYMPHATIC INVASION: NOT DEFINITELY SEEN. TOTAL NODES EXAMINED:22.  PELVIC NODES EXAMINED:20.  PELVIC NODES INVOLVED:0.  PARA-AORTIC NODES 2. EXAMINED:  PARA-AORTIC NODES 0. INVOLVED TNM STAGE: T1A N0 MX. AJCC STAGE GROUPING: IA. FIGO STAGE:IA.   08/26/2012 Imaging   No CT evidence for intra-abdominal or pelvic metastatic disease.  Trace free pelvic fluid, presumably postoperative although the date of surgery is not documented in the electronic medical record.   08/26/2012 - 12/13/2012 Chemotherapy   She received 6 cycles of carbo/taxol    10/29/2012 - 11/27/2012 Radiation Therapy   May 27, June 5,  June 11, June 19, November 27, 2012: Proximal vagina 30 Gy in 5 fractions     01/17/2013 Imaging   1.  No evidence of recurrent or metastatic disease. 2.  No acute abnormality involving the abdomen or pelvis. 3.  Mild diffuse hepatic steatosis. 4.  Very small supraumbilical midline anterior abdominal wall hernia containing fat, unchanged   01/22/2014 Imaging   No evidence for metastatic or recurrent disease. 2. No bowel obstruction.  Normal appendix. 3. Small fat containing hernia, stable in appearance. 4. Status post hysterectomy and bilateral oophorectomy   02/19/2014 Imaging   No pulmonary lesions are identified. The abnormality on the chest x-ray is due to asymmetric left-sided sternoclavicular joint degenerative disease   10/12/2014 Imaging   New mild retroperitoneal lymphadenopathy in the left paraaortic region and proximal left common iliac chain, consistent with metastatic disease. No other sites of metastatic disease identified within the abdomen or pelvis.     11/27/2014 - 02/11/2015 Chemotherapy   She received 4 cycles of carbo/taxol    03/01/2015 PET scan   Single hypermetabolic small retroperitoneal lymph node along the aorta. 2. No evidence of metastatic disease otherwise in the abdomen or pelvis. No evidence local recurrence. 3. Intensely hypermetabolic enlarged nodule adjacent to the RIGHT lobe of thyroid gland. This presumably represents the biopsied lesion in clinician report which was found to be benign thyroid tissue.   04/05/2015 - 05/13/2015 Radiation Therapy   She received 50.4 gray in 28 fractions with simultaneous integrated boost to 56 gray   06/17/2015 Imaging   No acute process or evidence of  metastatic disease in the abdomen or pelvis. Resolution of previously described retroperitoneal adenopathy. 2.  Possible constipation. 3. Atherosclerosis.   06/17/2015 Tumor Marker   Patient's tumor was tested  for the following markers: CA125 Results of the tumor marker test revealed 33   08/12/2015 Tumor Marker   Patient's tumor was tested for the following markers: CA125 Results of the tumor marker test revealed 52   08/31/2015 PET scan   Development of right paratracheal hypermetabolic adenopathy, consistent with nodal metastasis. 2. The previously described isolated abdominal retroperitoneal hypermetabolic node has resolved. 3. Persistent hypermetabolic right thyroid nodule, per report previously biopsied. Correlate with those results. 4.  Possible constipation.   09/09/2015 -  Anti-estrogen oral therapy   She has been receiving alternative treatment between megace and tamoxifen   09/09/2015 Tumor Marker   Patient's tumor was tested for the following markers: CA125 Results of the tumor marker test revealed 37.8   09/20/2015 Procedure   Technically successful ultrasound-guided thyroid aspiration biopsy , dominant right nodule   09/20/2015 Pathology Results   THYROID, RIGHT, FINE NEEDLE ASPIRATION (SPECIMEN 1 OF 1 COLLECTED 09/20/15): FINDINGS CONSISTENT WITH BENIGN THYROID NODULE (BETHESDA CATEGORY II).   10/28/2015 Tumor Marker   Patient's tumor was tested for the following markers: CA125 Results of the tumor marker test revealed 53.2   11/04/2015 Imaging   Stable benign right thyroid nodule   12/16/2015 Tumor Marker   Patient's tumor was tested for the following markers: CA125 Results of the tumor marker test revealed 38.1   03/22/2016 PET scan   Interval progression of hypermetabolic right paratracheal lymph node consistent with metastatic involvement. 2. Stable hypermetabolic right thyroid nodule. Reportedly this has been biopsied in the past.   03/23/2016 Tumor Marker   Patient's  tumor was tested for the following markers: CA125 Results of the tumor marker test revealed 62.4   04/13/2016 - 06/01/2016 Radiation Therapy   She received 56 Gy to the chest in 28 fractions    05/09/2016 Imaging   No evidence of left lower extremity deep vein thrombosis. No evidence of a superficial thrombosis of the greater and lesser saphenous veins. Positive for thrombus noted in several varicosities of the calf. No evidence of Baker's cyst on the left.   05/17/2016 Tumor Marker   Patient's tumor was tested for the following markers: CA125 Results of the tumor marker test revealed 9   06/29/2016 Tumor Marker   Patient's tumor was tested for the following markers: CA125 Results of the tumor marker test revealed 5.9   08/04/2016 Tumor Marker   Patient's tumor was tested for the following markers: CA125 Results of the tumor marker test revealed 6.0   09/12/2016 PET scan   Complete metabolic response to therapy, with resolution of hypermetabolic mediastinal lymphadenopathy since prior exam. No residual or new metastatic disease identified. Stable hypermetabolic right thyroid lobe nodule, which was previously biopsied on 09/20/2015.   03/13/2017 PET scan   1. Solitary focus of recurrent right paratracheal hypermetabolic activity, with a 1.0 cm right lower paratracheal node having a maximum SUV of 9.0. Appearance compatible with recurrent malignancy. 2. Continued hypermetabolic right thyroid nodule, previously biopsied, presumed benign -correlate with prior biopsy results. 3. Other imaging findings of potential clinical significance: Aortic Atherosclerosis (ICD10-I70.0). Mild cardiomegaly. Prominent stool throughout the colon favors constipation.   04/02/2017 Pathology Results   FINE NEEDLE ASPIRATION, ENDOSCOPIC, (EBUS) 4 R NODE (SPECIMEN 1 OF 2 COLLECTED 04/02/17): MALIGNANT CELLS PRESENT, CONSISTENT WITH CARCINOMA. SEE COMMENT. COMMENT: THE MALIGNANT CELLS ARE POSITIVE FOR P53 AND  NEGATIVE FOR ESTROGEN RECEPTOR AND TTF-1. THIS PROFILE IS NON-SPECIFIC, BUT THE P53 POSITIVE STAINING IS SUGGESTIVE OF GYNECOLOGIC PRIMARY.    04/11/2017 Procedure  Successful 8 French right internal jugular vein power port placement with its tip at the SVC/RA junction   04/12/2017 Imaging   Normal LV size with mild LV hypertrophy. EF 55-60%. Normal RV size and systolic function. Aortic valve sclerosis without significant stenosis.   04/18/2017 - 06/14/2017 Chemotherapy   The patient had chemotherapy with Doxil    07/10/2017 Imaging   - Left ventricle: The cavity size was normal. There was mild concentric hypertrophy. Systolic function was normal. The estimated ejection fraction was in the range of 55% to 60%. Wall motion was normal; there were no regional wall motion abnormalities. Doppler parameters are consistent with abnormal left ventricular relaxation (grade 1 diastolic dysfunction). - Aortic valve: Noncoronary cusp mobility was mildly restricted. - Mitral valve: There was mild regurgitation. - Left atrium: The atrium was mildly dilated. - Atrial septum: No defect or patent foramen ovale was identified.  Impressions:  - Compared to November 2018, global LV longitudinal strain remains normal (has increased)   07/11/2017 PET scan   Increased size and hypermetabolic activity of 3.3 cm right thyroid lobe nodule. Thyroid carcinoma cannot be excluded. Recommend repeat ultrasound guided fine needle aspiration to exclude thyroid carcinoma.  New adjacent hypermetabolic 10 mm right supraclavicular lymph node, suspicious for lymph node metastasis.  Slight increase in size and hypermetabolic activity of solitary right paratracheal lymph node.  No evidence of abdominal or pelvic metastatic disease.   07/18/2017 Procedure   1. Technically successful ultrasound guided fine needle aspiration of indeterminate hypermetabolic right-sided thyroid nodule/mass. 2. Technically successful ultrasound-guided  core needle biopsy of hypermetabolic right lower cervical lymph node.   07/18/2017 Pathology Results   THYROID, FINE NEEDLE ASPIRATION RIGHT (SPECIMEN 1 OF 1, COLLECTED ON 07/18/2017): ATYPIA OF UNDETERMINED SIGNIFICANCE OR FOLLICULAR LESION OF UNDETERMINED SIGNIFICANCE (BETHESDA CATEGORY III). SEE COMMENT. COMMENT: THE SPECIMEN CONSISTS OF SMALL AND MEDIUM SIZED GROUPS OF FOLLICULAR EPITHELIAL CELLS WITH MILD CYTOLOGIC ATYPIA INCLUDING NUCLEAR ENLARGEMENT AND HURTHLE CELL CHANGE. SOME GROUPS ARE ARRANGED AS MICROFOLLICLES. THERE IS MINIMAL BACKGROUND COLLOID. BASED ON THESE FEATURES, A FOLLICULAR LESION/NEOPLASM CAN NOT BE ENTIRELY RULED OUT. A SPECIMEN WILL BE SENT FOR AFIRMA TESTING.   07/19/2017 Pathology Results   Lymph node, needle/core biopsy - METASTATIC PAPILLARY SEROUS CARCINOMA. - SEE COMMENT. Microscopic Comment Dr. Vicente Males has reviewed the case and concurs with this interpretation   10/15/2017 PET scan   No new or progressive disease. No evidence of abdominal or pelvic metastatic disease.  Stable hypermetabolic right thyroid lobe nodule and adjacent right supraclavicular lymph node.  Decreased size and hypermetabolic activity of solitary right paratracheal lymph node.   01/23/2018 PET scan   1. Overall, no significant change from PET-CT of 3 months ago. There is persistent hypermetabolic activity at the right thoracic inlet and within a right paratracheal mediastinal node. The nodes have not significantly changed in size, although the metabolic activity has minimally increased over this interval. It is uncertain how much of the thoracic inlet metabolic activity is attributed to the thyroid nodule versus adjacent cervical lymph nodes, both previously biopsied. 2. No new hypermetabolic activity within the neck, chest, abdomen or pelvis. No evidence of local recurrence in the pelvis. 3. Stable probable radiation changes in the right lung.   04/16/2018 PET scan   1. Interval  increase in metabolic activity of the RIGHT supraclavicular node and RIGHT lower paratracheal mediastinal node with minimal change in size. Findings consistent with persistent and mildly progressed metastatic adenopathy. 2. New hypermetabolic small lymph node in the  RIGHT lower paratracheal nodal station adjacent to the previously followed node. 3. No evidence of local recurrence in the pelvis or new disease elsewhere.   07/30/2018 PET scan   1. Mild interval progression of right supraclavicular, mediastinal, and right hilar hypermetabolic metastatic disease. There is a new hypermetabolic lymph node in the subcarinal station on today's study. 2. No hypermetabolic disease in the abdomen or pelvis to suggest recurrent/metastases. 3.  Aortic Atherosclerois (ICD10-170.0)    08/16/2018 -  Chemotherapy   The patient had Lenvima since 3/6 and pembrolizumab since 3/13 for chemotherapy treatment. Michel Santee was held temporarily due to infection and severe hypertension   10/31/2018 PET scan   Partial response to therapy, with decreased hypermetabolic lymphadenopathy in mediastinum and right hilar region.  No significant change in hypermetabolic lymphadenopathy in right inferior neck.  No new or progressive metastatic disease identified. No evidence of recurrent or metastatic carcinoma in the pelvis or abdomen   02/04/2019 PET scan   1. Decrease in metabolic activity of RIGHT supraclavicular node and RIGHT paratracheal lymph nodes. 2. Persistent activity in the RIGHT thyroid nodule unchanged. 3. Diffuse activity through the esophagus is favored esophagitis. No change. 4. Scattered foci of intense metabolic activity associated the bowel without focal lesion on CT favored benign. 5. No evidence of peritoneal metastasis. 6. No evidence of metastatic adenopathy in the abdomen pelvis. 7. No evidence of local pelvic sidewall recurrence.   07/30/2019 PET scan   1. Mildly increased uptake within small nodes  along the right sternocleidomastoid without change in size and associated with increased deltoid activity may reflect changes related to right arm injection or recent COVID vaccination, consider clinical correlation and attention on follow-up. 2. Persistent activity in the right thyroid nodule, unchanged from previous exams. 3. New activity in a juxta esophageal lymph node in the chest is suspicious though the node is unchanged with respect to size. Endoscopic correlation could potentially be helpful or close attention on follow-up. 4. Subtle increased FDG uptake along the left mainstem bronchus and adjacent to the aorta may represent a small lymph node. No discrete correlate is demonstrated on today's study.   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[place])  07/11/2017 Initial Diagnosis   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[place])   08/16/2018 -  Chemotherapy   The patient had pembrolizumab (KEYTRUDA) 200 mg in sodium chloride 0.9 % 50 mL chemo infusion, 200 mg, Intravenous, Once, 8 of 9 cycles Administration: 200 mg (08/16/2018), 200 mg (09/06/2018), 200 mg (09/30/2018), 200 mg (10/21/2018), 200 mg (11/11/2018), 200 mg (12/02/2018), 200 mg (12/23/2018), 200 mg (01/13/2019)  for chemotherapy treatment.      REVIEW OF SYSTEMS:   Constitutional: Denies fevers, chills or abnormal weight loss Eyes: Denies blurriness of vision Ears, nose, mouth, throat, and face: Denies mucositis or sore throat Respiratory: Denies cough, dyspnea or wheezes Cardiovascular: Denies palpitation, chest discomfort or lower extremity swelling Skin: Denies abnormal skin rashes Lymphatics: Denies new lymphadenopathy or easy bruising Neurological:Denies numbness, tingling or new weaknesses Behavioral/Psych: Mood is stable, no new changes  All other systems were reviewed with the patient and are negative.  I have reviewed the past medical history, past surgical history, social history and family history with the patient and they  are unchanged from previous note.  ALLERGIES:  has No Known Allergies.  MEDICATIONS:  Current Outpatient Medications  Medication Sig Dispense Refill  . amLODipine (NORVASC) 10 MG tablet TAKE 1 TABLET BY MOUTH DAILY. DECREASE SIMVASTATIN TO 20MG DAILY WHILE ON AMLODIPINE PER MD  90 tablet 1  . busPIRone (BUSPAR) 10 MG tablet Take 10 mg by mouth 2 (two) times daily.    . Calcium Carb-Cholecalciferol 500-600 MG-UNIT TABS Take 1 tablet by mouth daily.     . Cholecalciferol (VITAMIN D3) 2000 units TABS Take 2,000 Units by mouth daily.     . hydrochlorothiazide (HYDRODIURIL) 50 MG tablet TAKE 1 TABLET (50 MG TOTAL) BY MOUTH DAILY. 90 tablet 11  . lenvatinib 10 mg daily dose (LENVIMA, 10 MG DAILY DOSE,) capsule Take 1 capsule (10 mg total) by mouth daily. 30 capsule 11  . levothyroxine (SYNTHROID) 75 MCG tablet Take 1 tablet (75 mcg total) by mouth daily before breakfast. 90 tablet 2  . LORazepam (ATIVAN) 0.5 MG tablet Take 0.5 mg by mouth 2 (two) times daily as needed.  0  . losartan (COZAAR) 50 MG tablet Take 1 tablet (50 mg total) by mouth daily. 90 tablet 11  . metoprolol tartrate (LOPRESSOR) 25 MG tablet Take 1 tablet (25 mg total) by mouth 2 (two) times daily. 180 tablet 3  . ondansetron (ZOFRAN) 8 MG tablet Take 1 tablet (8 mg total) by mouth every 8 (eight) hours as needed for nausea. 30 tablet 3  . pantoprazole (PROTONIX) 40 MG tablet Take 1 tablet (40 mg total) by mouth daily. 90 tablet 3  . simvastatin (ZOCOR) 40 MG tablet Take 40 mg by mouth every evening.    . tacrolimus (PROTOPIC) 0.1 % ointment Apply 1 application topically daily as needed (rash).     . venlafaxine XR (EFFEXOR-XR) 75 MG 24 hr capsule Take 300 mg by mouth daily with breakfast.      No current facility-administered medications for this visit.    PHYSICAL EXAMINATION: ECOG PERFORMANCE STATUS: 1 - Symptomatic but completely ambulatory  Vitals:   07/31/19 1049  BP: 135/76  Pulse: 72  Resp: 17  Temp: 98 F (36.7  C)  SpO2: 100%   Filed Weights   07/31/19 1049  Weight: 157 lb 6.4 oz (71.4 kg)    GENERAL:alert, no distress and comfortable Musculoskeletal:no cyanosis of digits and no clubbing  NEURO: alert & oriented x 3 with fluent speech, no focal motor/sensory deficits  LABORATORY DATA:  I have reviewed the data as listed    Component Value Date/Time   NA 141 07/31/2019 1038   NA 141 05/17/2017 0957   K 3.8 07/31/2019 1038   K 3.6 05/17/2017 0957   CL 103 07/31/2019 1038   CL 105 11/19/2012 0848   CO2 27 07/31/2019 1038   CO2 25 05/17/2017 0957   GLUCOSE 99 07/31/2019 1038   GLUCOSE 85 05/17/2017 0957   GLUCOSE 122 (H) 11/19/2012 0848   BUN 25 (H) 07/31/2019 1038   BUN 15.2 05/17/2017 0957   CREATININE 0.87 07/31/2019 1038   CREATININE 0.83 07/10/2019 1240   CREATININE 0.8 05/17/2017 0957   CALCIUM 9.2 07/31/2019 1038   CALCIUM 9.6 05/17/2017 0957   PROT 7.1 07/31/2019 1038   PROT 7.0 05/17/2017 0957   ALBUMIN 3.7 07/31/2019 1038   ALBUMIN 3.9 05/17/2017 0957   AST 19 07/31/2019 1038   AST 23 07/10/2019 1240   AST 25 05/17/2017 0957   ALT 17 07/31/2019 1038   ALT 20 07/10/2019 1240   ALT 18 05/17/2017 0957   ALKPHOS 58 07/31/2019 1038   ALKPHOS 57 05/17/2017 0957   BILITOT 0.5 07/31/2019 1038   BILITOT 0.4 07/10/2019 1240   BILITOT 0.47 05/17/2017 0957   GFRNONAA >60 07/31/2019 1038   GFRNONAA >  60 07/10/2019 1240   GFRAA >60 07/31/2019 1038   GFRAA >60 07/10/2019 1240    No results found for: SPEP, UPEP  Lab Results  Component Value Date   WBC 3.9 (L) 07/31/2019   NEUTROABS 2.8 07/31/2019   HGB 10.5 (L) 07/31/2019   HCT 32.1 (L) 07/31/2019   MCV 97.0 07/31/2019   PLT 195 07/31/2019      Chemistry      Component Value Date/Time   NA 141 07/31/2019 1038   NA 141 05/17/2017 0957   K 3.8 07/31/2019 1038   K 3.6 05/17/2017 0957   CL 103 07/31/2019 1038   CL 105 11/19/2012 0848   CO2 27 07/31/2019 1038   CO2 25 05/17/2017 0957   BUN 25 (H) 07/31/2019  1038   BUN 15.2 05/17/2017 0957   CREATININE 0.87 07/31/2019 1038   CREATININE 0.83 07/10/2019 1240   CREATININE 0.8 05/17/2017 0957   GLU 158 (H) 01/14/2015 1035      Component Value Date/Time   CALCIUM 9.2 07/31/2019 1038   CALCIUM 9.6 05/17/2017 0957   ALKPHOS 58 07/31/2019 1038   ALKPHOS 57 05/17/2017 0957   AST 19 07/31/2019 1038   AST 23 07/10/2019 1240   AST 25 05/17/2017 0957   ALT 17 07/31/2019 1038   ALT 20 07/10/2019 1240   ALT 18 05/17/2017 0957   BILITOT 0.5 07/31/2019 1038   BILITOT 0.4 07/10/2019 1240   BILITOT 0.47 05/17/2017 0957       RADIOGRAPHIC STUDIES: I have reviewed multiple imaging studies with the patient I have personally reviewed the radiological images as listed and agreed with the findings in the report. NM PET Image Restag (PS) Skull Base To Thigh  Result Date: 07/30/2019 CLINICAL DATA:  Subsequent treatment strategy for uterine/cervical cancer. EXAM: NUCLEAR MEDICINE PET SKULL BASE TO THIGH TECHNIQUE: Eight mCi F-18 FDG was injected intravenously. Full-ring PET imaging was performed from the skull base to thigh after the radiotracer. CT data was obtained and used for attenuation correction and anatomic localization. Fasting blood glucose: 90 mg/dl COMPARISON:  Multiple prior examinations available for comparison most recent of 02/04/2019 FINDINGS: Mediastinal blood pool activity: SUV max 2.38 Liver activity: SUV max NA NECK: Increase in number of right supraclavicular lymph nodes and increase in FDG uptake in some of these areas compared to the previous study. Previous right supraclavicular lymph node tracking into the thoracic inlet is no longer clearly visualized. Small lymph node (image 50, series 4) 5 mm,(SUVmax = 4.86 a previous maximum SUV in this area approximately 5.7 Hypermetabolic right thyroid lesion with similar appearance. (Image 48, series 4) this measures approximately 1.7 cm greatest dimension. Other small lymph nodes lateral to the is  sternocleidomastoid muscle are noted the show mildly increased FDG uptake similar to areas on the prior exam. Perhaps slightly increased but associated with right deltoid that is asymmetric to the left. Incidental CT findings: Right IJ Port-A-Cath remains in place terminating at the caval to atrial junction. CHEST: Signs of diffuse esophageal activity. Juxta esophageal lymph node measuring 7 mm may have been present on the prior study but not show FDG uptake. (SUVmax = 9.55) Subtle increased activity along the left mainstem bronchus along the left anterolateral margin of the aorta (image 67, series 4) (SUVmax = 4.35) no measurable lesion in this location. Incidental CT findings: Signs of scarring along the right mediastinal border with similar appearance. No signs of consolidation, pleural effusion or suspicious nodule. Signs of atherosclerotic calcification. No signs of  pericardial effusion. ABDOMEN/PELVIS: No abnormal hypermetabolic activity within the liver, pancreas, adrenal glands, or spleen. No hypermetabolic lymph nodes in the abdomen or pelvis. Incidental CT findings: None SKELETON: No focal hypermetabolic activity to suggest skeletal metastasis. Incidental CT findings: none IMPRESSION: 1. Mildly increased uptake within small nodes along the right sternocleidomastoid without change in size and associated with increased deltoid activity may reflect changes related to right arm injection or recent COVID vaccination, consider clinical correlation and attention on follow-up. 2. Persistent activity in the right thyroid nodule, unchanged from previous exams. 3. New activity in a juxta esophageal lymph node in the chest is suspicious though the node is unchanged with respect to size. Endoscopic correlation could potentially be helpful or close attention on follow-up. 4. Subtle increased FDG uptake along the left mainstem bronchus and adjacent to the aorta may represent a small lymph node. No discrete correlate is  demonstrated on today's study. Electronically Signed   By: Zetta Bills M.D.   On: 07/30/2019 14:47

## 2019-08-01 NOTE — Telephone Encounter (Signed)
Scheduled appt per 2/26 sch message - unable to reach pt . Left message with appt date and time

## 2019-08-01 NOTE — Assessment & Plan Note (Signed)
I have reviewed multiple imaging studies with the patient She has no clear signs of disease progression The abnormalities seen near her esophagus and the bronchial airway are not typical for cancer progression I recommend observation only with repeat PET CT scan in 3 months, around end of May 2021 We will proceed with treatment as scheduled without delay

## 2019-08-01 NOTE — Assessment & Plan Note (Signed)
The supraclavicular lymph node is not evident on recent PET scan Observe only

## 2019-08-01 NOTE — Assessment & Plan Note (Signed)
She has intermittent proteinuria even though her blood pressure remains well controlled She will continue aggressive blood pressure management 

## 2019-08-01 NOTE — Assessment & Plan Note (Signed)
This is due to side effects of treatment She is not symptomatic Observe only 

## 2019-08-06 DIAGNOSIS — M8580 Other specified disorders of bone density and structure, unspecified site: Secondary | ICD-10-CM | POA: Diagnosis not present

## 2019-08-06 DIAGNOSIS — D61810 Antineoplastic chemotherapy induced pancytopenia: Secondary | ICD-10-CM | POA: Diagnosis not present

## 2019-08-06 DIAGNOSIS — I700 Atherosclerosis of aorta: Secondary | ICD-10-CM | POA: Diagnosis not present

## 2019-08-06 DIAGNOSIS — E041 Nontoxic single thyroid nodule: Secondary | ICD-10-CM | POA: Diagnosis not present

## 2019-08-06 DIAGNOSIS — I5189 Other ill-defined heart diseases: Secondary | ICD-10-CM | POA: Diagnosis not present

## 2019-08-06 DIAGNOSIS — E785 Hyperlipidemia, unspecified: Secondary | ICD-10-CM | POA: Diagnosis not present

## 2019-08-06 DIAGNOSIS — C541 Malignant neoplasm of endometrium: Secondary | ICD-10-CM | POA: Diagnosis not present

## 2019-08-06 DIAGNOSIS — K219 Gastro-esophageal reflux disease without esophagitis: Secondary | ICD-10-CM | POA: Diagnosis not present

## 2019-08-06 MED FILL — LEVOTHYROXINE 88 MCG TABLET: 88 | 90 days supply | Qty: 90 | Fill #0

## 2019-08-13 MED FILL — LOSARTAN POTASSIUM 50 MG TA: 50 | 30 days supply | Qty: 30 | Fill #11

## 2019-08-21 ENCOUNTER — Other Ambulatory Visit: Payer: Self-pay

## 2019-08-21 ENCOUNTER — Inpatient Hospital Stay: Payer: 59

## 2019-08-21 ENCOUNTER — Inpatient Hospital Stay: Payer: 59 | Attending: Hematology and Oncology

## 2019-08-21 VITALS — BP 110/67 | HR 75 | Temp 97.9°F | Resp 18

## 2019-08-21 DIAGNOSIS — Z5112 Encounter for antineoplastic immunotherapy: Secondary | ICD-10-CM | POA: Diagnosis not present

## 2019-08-21 DIAGNOSIS — C541 Malignant neoplasm of endometrium: Secondary | ICD-10-CM | POA: Insufficient documentation

## 2019-08-21 DIAGNOSIS — Z95828 Presence of other vascular implants and grafts: Secondary | ICD-10-CM

## 2019-08-21 DIAGNOSIS — Z7189 Other specified counseling: Secondary | ICD-10-CM

## 2019-08-21 DIAGNOSIS — C770 Secondary and unspecified malignant neoplasm of lymph nodes of head, face and neck: Secondary | ICD-10-CM

## 2019-08-21 LAB — COMPREHENSIVE METABOLIC PANEL
ALT: 15 U/L (ref 0–44)
AST: 20 U/L (ref 15–41)
Albumin: 3.6 g/dL (ref 3.5–5.0)
Alkaline Phosphatase: 50 U/L (ref 38–126)
Anion gap: 10 (ref 5–15)
BUN: 28 mg/dL — ABNORMAL HIGH (ref 8–23)
CO2: 25 mmol/L (ref 22–32)
Calcium: 9.2 mg/dL (ref 8.9–10.3)
Chloride: 101 mmol/L (ref 98–111)
Creatinine, Ser: 1.02 mg/dL — ABNORMAL HIGH (ref 0.44–1.00)
GFR calc Af Amer: >60 mL/min (ref >60)
GFR calc non Af Amer: 57 mL/min — ABNORMAL LOW (ref >60)
Glucose, Bld: 84 mg/dL (ref 70–99)
Potassium: 3.8 mmol/L (ref 3.5–5.1)
Sodium: 136 mmol/L (ref 135–145)
Total Bilirubin: 0.4 mg/dL (ref 0.3–1.2)
Total Protein: 6.8 g/dL (ref 6.5–8.1)

## 2019-08-21 LAB — CBC WITH DIFFERENTIAL/PLATELET
Abs Immature Granulocytes: 0.01 10*3/uL (ref 0.00–0.07)
Basophils Absolute: 0 10*3/uL (ref 0.0–0.1)
Basophils Relative: 1 %
Eosinophils Absolute: 0.2 10*3/uL (ref 0.0–0.5)
Eosinophils Relative: 6 %
HCT: 31.5 % — ABNORMAL LOW (ref 36.0–46.0)
Hemoglobin: 10.2 g/dL — ABNORMAL LOW (ref 12.0–15.0)
Immature Granulocytes: 0 %
Lymphocytes Relative: 25 %
Lymphs Abs: 0.9 10*3/uL (ref 0.7–4.0)
MCH: 32.5 pg (ref 26.0–34.0)
MCHC: 32.4 g/dL (ref 30.0–36.0)
MCV: 100.3 fL — ABNORMAL HIGH (ref 80.0–100.0)
Monocytes Absolute: 0.3 10*3/uL (ref 0.1–1.0)
Monocytes Relative: 10 %
Neutro Abs: 2.1 10*3/uL (ref 1.7–7.7)
Neutrophils Relative %: 58 %
Platelets: 176 10*3/uL (ref 150–400)
RBC: 3.14 MIL/uL — ABNORMAL LOW (ref 3.87–5.11)
RDW: 13.1 % (ref 11.5–15.5)
WBC: 3.6 10*3/uL — ABNORMAL LOW (ref 4.0–10.5)
nRBC: 0 % (ref 0.0–0.2)

## 2019-08-21 LAB — TOTAL PROTEIN, URINE DIPSTICK: Protein, ur: NEGATIVE mg/dL

## 2019-08-21 MED ORDER — HEPARIN SOD (PORK) LOCK FLUSH 100 UNIT/ML IV SOLN
500.0000 [IU] | Freq: Once | INTRAVENOUS | Status: AC | PRN
  Administered 2019-08-21: 500 [IU]
  Filled 2019-08-21: qty 5

## 2019-08-21 MED ORDER — SODIUM CHLORIDE 0.9% FLUSH
10.0000 mL | Freq: Once | INTRAVENOUS | Status: AC
  Administered 2019-08-21: 10 mL
  Filled 2019-08-21: qty 10

## 2019-08-21 MED ORDER — SODIUM CHLORIDE 0.9% FLUSH
10.0000 mL | INTRAVENOUS | Status: DC | PRN
  Administered 2019-08-21: 10 mL
  Filled 2019-08-21: qty 10

## 2019-08-21 MED ORDER — SODIUM CHLORIDE 0.9 % IV SOLN
200.0000 mg | Freq: Once | INTRAVENOUS | Status: AC
  Administered 2019-08-21: 200 mg via INTRAVENOUS
  Filled 2019-08-21: qty 8

## 2019-08-21 MED ORDER — SODIUM CHLORIDE 0.9 % IV SOLN
Freq: Once | INTRAVENOUS | Status: AC
  Filled 2019-08-21: qty 250

## 2019-08-21 NOTE — Patient Instructions (Signed)
[facility] Discharge Instructions for Patients Receiving Chemotherapy  Today you received the following chemotherapy agent: Keytruda.  To help prevent nausea and vomiting after your treatment, we encourage you to take your nausea medication as directed.   If you develop nausea and vomiting that is not controlled by your nausea medication, call the clinic.   BELOW ARE SYMPTOMS THAT SHOULD BE REPORTED IMMEDIATELY:  *FEVER GREATER THAN 100.5 F  *CHILLS WITH OR WITHOUT FEVER  NAUSEA AND VOMITING THAT IS NOT CONTROLLED WITH YOUR NAUSEA MEDICATION  *UNUSUAL SHORTNESS OF BREATH  *UNUSUAL BRUISING OR BLEEDING  TENDERNESS IN MOUTH AND THROAT WITH OR WITHOUT PRESENCE OF ULCERS  *URINARY PROBLEMS  *BOWEL PROBLEMS  UNUSUAL RASH Items with * indicate a potential emergency and should be followed up as soon as possible.  Feel free to call the clinic should you have any questions or concerns. The clinic phone number is [phone].  Please show the CHEMO ALERT CARD at check-in to the Emergency Department and triage nurse.   

## 2019-08-22 LAB — TSH: TSH: 2.011 u[IU]/mL (ref 0.308–3.960)

## 2019-08-25 MED FILL — LENVIMA 10 MG DAILY DOSE: 10 | 30 days supply | Qty: 30 | Fill #9

## 2019-08-29 MED FILL — busPIRone HCL 10 MG TABS: 10 | 90 days supply | Qty: 180 | Fill #0

## 2019-09-04 NOTE — Progress Notes (Signed)

## 2019-09-09 MED FILL — SIMVASTATIN 20 MG TABLET: 20 | 90 days supply | Qty: 90 | Fill #0

## 2019-09-09 MED FILL — HYDROCHLOROTHIAZIDE 50 MG T: 50 | 90 days supply | Qty: 90 | Fill #2

## 2019-09-11 ENCOUNTER — Inpatient Hospital Stay: Payer: 59 | Attending: Hematology and Oncology

## 2019-09-11 ENCOUNTER — Inpatient Hospital Stay (HOSPITAL_BASED_OUTPATIENT_CLINIC_OR_DEPARTMENT_OTHER): Payer: 59 | Admitting: Hematology and Oncology

## 2019-09-11 ENCOUNTER — Other Ambulatory Visit: Payer: Self-pay | Admitting: Hematology and Oncology

## 2019-09-11 ENCOUNTER — Inpatient Hospital Stay: Payer: 59

## 2019-09-11 ENCOUNTER — Other Ambulatory Visit: Payer: Self-pay

## 2019-09-11 ENCOUNTER — Encounter: Payer: Self-pay | Admitting: Hematology and Oncology

## 2019-09-11 VITALS — BP 120/66 | HR 71 | Temp 98.2°F | Resp 18 | Ht 63.0 in | Wt 158.8 lb

## 2019-09-11 DIAGNOSIS — Z9221 Personal history of antineoplastic chemotherapy: Secondary | ICD-10-CM | POA: Diagnosis not present

## 2019-09-11 DIAGNOSIS — C541 Malignant neoplasm of endometrium: Secondary | ICD-10-CM

## 2019-09-11 DIAGNOSIS — E039 Hypothyroidism, unspecified: Secondary | ICD-10-CM | POA: Diagnosis not present

## 2019-09-11 DIAGNOSIS — Z90710 Acquired absence of both cervix and uterus: Secondary | ICD-10-CM | POA: Insufficient documentation

## 2019-09-11 DIAGNOSIS — Z90722 Acquired absence of ovaries, bilateral: Secondary | ICD-10-CM | POA: Diagnosis not present

## 2019-09-11 DIAGNOSIS — R946 Abnormal results of thyroid function studies: Secondary | ICD-10-CM | POA: Diagnosis not present

## 2019-09-11 DIAGNOSIS — Z7189 Other specified counseling: Secondary | ICD-10-CM

## 2019-09-11 DIAGNOSIS — D61818 Other pancytopenia: Secondary | ICD-10-CM

## 2019-09-11 DIAGNOSIS — Z79899 Other long term (current) drug therapy: Secondary | ICD-10-CM | POA: Insufficient documentation

## 2019-09-11 DIAGNOSIS — Z5112 Encounter for antineoplastic immunotherapy: Secondary | ICD-10-CM | POA: Insufficient documentation

## 2019-09-11 DIAGNOSIS — Z95828 Presence of other vascular implants and grafts: Secondary | ICD-10-CM

## 2019-09-11 DIAGNOSIS — E041 Nontoxic single thyroid nodule: Secondary | ICD-10-CM | POA: Diagnosis not present

## 2019-09-11 DIAGNOSIS — Z9079 Acquired absence of other genital organ(s): Secondary | ICD-10-CM | POA: Insufficient documentation

## 2019-09-11 DIAGNOSIS — G35 Multiple sclerosis: Secondary | ICD-10-CM | POA: Insufficient documentation

## 2019-09-11 DIAGNOSIS — I10 Essential (primary) hypertension: Secondary | ICD-10-CM | POA: Diagnosis not present

## 2019-09-11 DIAGNOSIS — Z923 Personal history of irradiation: Secondary | ICD-10-CM | POA: Insufficient documentation

## 2019-09-11 DIAGNOSIS — C770 Secondary and unspecified malignant neoplasm of lymph nodes of head, face and neck: Secondary | ICD-10-CM | POA: Diagnosis not present

## 2019-09-11 LAB — COMPREHENSIVE METABOLIC PANEL
ALT: 19 U/L (ref 0–44)
AST: 21 U/L (ref 15–41)
Albumin: 3.6 g/dL (ref 3.5–5.0)
Alkaline Phosphatase: 51 U/L (ref 38–126)
Anion gap: 8 (ref 5–15)
BUN: 29 mg/dL — ABNORMAL HIGH (ref 8–23)
CO2: 28 mmol/L (ref 22–32)
Calcium: 9.4 mg/dL (ref 8.9–10.3)
Chloride: 103 mmol/L (ref 98–111)
Creatinine, Ser: 0.84 mg/dL (ref 0.44–1.00)
GFR calc Af Amer: >60 mL/min (ref >60)
GFR calc non Af Amer: >60 mL/min (ref >60)
Glucose, Bld: 86 mg/dL (ref 70–99)
Potassium: 3.9 mmol/L (ref 3.5–5.1)
Sodium: 139 mmol/L (ref 135–145)
Total Bilirubin: 0.4 mg/dL (ref 0.3–1.2)
Total Protein: 7 g/dL (ref 6.5–8.1)

## 2019-09-11 LAB — CBC WITH DIFFERENTIAL/PLATELET
Abs Immature Granulocytes: 0.01 10*3/uL (ref 0.00–0.07)
Basophils Absolute: 0 10*3/uL (ref 0.0–0.1)
Basophils Relative: 1 %
Eosinophils Absolute: 0.2 10*3/uL (ref 0.0–0.5)
Eosinophils Relative: 6 %
HCT: 32.8 % — ABNORMAL LOW (ref 36.0–46.0)
Hemoglobin: 10.4 g/dL — ABNORMAL LOW (ref 12.0–15.0)
Immature Granulocytes: 0 %
Lymphocytes Relative: 19 %
Lymphs Abs: 0.7 10*3/uL (ref 0.7–4.0)
MCH: 31.5 pg (ref 26.0–34.0)
MCHC: 31.7 g/dL (ref 30.0–36.0)
MCV: 99.4 fL (ref 80.0–100.0)
Monocytes Absolute: 0.3 10*3/uL (ref 0.1–1.0)
Monocytes Relative: 8 %
Neutro Abs: 2.5 10*3/uL (ref 1.7–7.7)
Neutrophils Relative %: 66 %
Platelets: 167 10*3/uL (ref 150–400)
RBC: 3.3 MIL/uL — ABNORMAL LOW (ref 3.87–5.11)
RDW: 12.9 % (ref 11.5–15.5)
WBC: 3.8 10*3/uL — ABNORMAL LOW (ref 4.0–10.5)
nRBC: 0 % (ref 0.0–0.2)

## 2019-09-11 LAB — TSH: TSH: 2.217 u[IU]/mL (ref 0.308–3.960)

## 2019-09-11 MED ORDER — HEPARIN SOD (PORK) LOCK FLUSH 100 UNIT/ML IV SOLN
500.0000 [IU] | Freq: Once | INTRAVENOUS | Status: AC | PRN
  Administered 2019-09-11: 500 [IU]
  Filled 2019-09-11: qty 5

## 2019-09-11 MED ORDER — SODIUM CHLORIDE 0.9 % IV SOLN
200.0000 mg | Freq: Once | INTRAVENOUS | Status: AC
  Administered 2019-09-11: 200 mg via INTRAVENOUS
  Filled 2019-09-11: qty 8

## 2019-09-11 MED ORDER — SODIUM CHLORIDE 0.9 % IV SOLN
Freq: Once | INTRAVENOUS | Status: AC
  Filled 2019-09-11: qty 250

## 2019-09-11 MED ORDER — SODIUM CHLORIDE 0.9% FLUSH
10.0000 mL | INTRAVENOUS | Status: DC | PRN
  Administered 2019-09-11: 10 mL
  Filled 2019-09-11: qty 10

## 2019-09-11 MED ORDER — SODIUM CHLORIDE 0.9% FLUSH
10.0000 mL | Freq: Once | INTRAVENOUS | Status: AC
  Administered 2019-09-11: 10 mL
  Filled 2019-09-11: qty 10

## 2019-09-11 NOTE — Assessment & Plan Note (Signed)
This is due to side effects of treatment She is not symptomatic Observe only 

## 2019-09-11 NOTE — Assessment & Plan Note (Signed)
She tolerated treatment well I plan to repeat PET scan end of next month We will proceed with treatment as scheduled without delay

## 2019-09-11 NOTE — Assessment & Plan Note (Signed)
She has intermittent elevated TSH I will adjust her thyroid medicine accordingly 

## 2019-09-11 NOTE — Patient Instructions (Signed)
[facility] Discharge Instructions for Patients Receiving Chemotherapy  Today you received the following chemotherapy agents: pembrolizumab.  To help prevent nausea and vomiting after your treatment, we encourage you to take your nausea medication as directed.   If you develop nausea and vomiting that is not controlled by your nausea medication, call the clinic.   BELOW ARE SYMPTOMS THAT SHOULD BE REPORTED IMMEDIATELY:  *FEVER GREATER THAN 100.5 F  *CHILLS WITH OR WITHOUT FEVER  NAUSEA AND VOMITING THAT IS NOT CONTROLLED WITH YOUR NAUSEA MEDICATION  *UNUSUAL SHORTNESS OF BREATH  *UNUSUAL BRUISING OR BLEEDING  TENDERNESS IN MOUTH AND THROAT WITH OR WITHOUT PRESENCE OF ULCERS  *URINARY PROBLEMS  *BOWEL PROBLEMS  UNUSUAL RASH Items with * indicate a potential emergency and should be followed up as soon as possible.  Feel free to call the clinic should you have any questions or concerns. The clinic phone number is [phone].  Please show the CHEMO ALERT CARD at check-in to the Emergency Department and triage nurse.   

## 2019-09-11 NOTE — Assessment & Plan Note (Signed)
She has intermittent proteinuria even though her blood pressure remains well controlled She will continue aggressive blood pressure management 

## 2019-09-11 NOTE — Progress Notes (Signed)
[place] OFFICE PROGRESS NOTE  Patient Care Team: [name], MD as PCP - General (Family Medicine) Reynold Bowen, MD as Consulting Physician (Endocrinology)  ASSESSMENT & PLAN:  Endometrial cancer [facility]) She tolerated treatment well I plan to repeat PET scan end of next month We will proceed with treatment as scheduled without delay  Pancytopenia, acquired [facility]) This is due to side effects of treatment She is not symptomatic Observe only  Essential hypertension She has intermittent proteinuria even though her blood pressure remains well controlled She will continue aggressive blood pressure management  Acquired hypothyroidism She has intermittent elevated TSH I will adjust her thyroid medicine accordingly   Orders Placed This Encounter  Procedures  . NM PET Image Restag (PS) Skull Base To Thigh    Standing Status:   Future    Standing Expiration Date:   09/10/2020    Order Specific Question:   If indicated for the ordered procedure, I authorize the administration of a radiopharmaceutical per Radiology protocol    Answer:   Yes    Order Specific Question:   Preferred imaging location?    Answer:   [facility]    Order Specific Question:   Radiology Contrast Protocol - do NOT remove file path    Answer:   [email or web address]  . Total Protein, Urine dipstick    Standing Status:   Standing    Number of Occurrences:   11    Standing Expiration Date:   09/10/2020    All questions were answered. The patient knows to call the clinic with any problems, questions or concerns. The total time spent in the appointment was 20 minutes encounter with patients including review of chart and various tests results, discussions about plan of care and coordination of care plan   [name], MD 09/11/2019 12:40 PM  INTERVAL HISTORY: Please see below for problem oriented charting. She returns for treatment and follow-up No side-effects so  far BP control is good  SUMMARY OF ONCOLOGIC HISTORY: Oncology History Overview Note  [employer] testing done 11-2015 on surgical path from 2014: MS stable TMB low 4 muts/mb ATM [id number] ERBB3 T389K E2H2 rearrangement exon 9 PPP2R1A P179R TP53 I195N    ER- APPROXIMATELY 25-35% STAINING IN NEOPLASTIC CELLS (INTERMEDIATE)  PR- APPROXIMATELY 25-35% STAINING IN NEOPLASTIC CELLS (STRONG)  Repeat biopsy 04/02/17: ER negative, Her 2 negative  Progressed on Doxil   Endometrial cancer ([place])  06/20/2012 Pathology Results   Biopsy positive for papillary serous carcinoma   06/20/2012 Genetic Testing   Foundation One testing done 11-2015 on surgical path from 2014: MS stable TMB low 4 muts/mb ATM [id number] ERBB3 T389K E2H2 rearrangement exon 9 PPP2R1A P179R TP53 I195N    06/20/2012 Initial Diagnosis   Patient presented to PCP with intermittent vaginal bleeding since ~ Oct 2013, endometrial biopsy 06-20-12 with complex endometrial hyperplasia with atypia   06/26/2012 Imaging   Thickened endometrial lining in a postmenopausal patient experiencing vaginal bleeding. In the setting of post-menopausal bleeding, endometrial sampling is indicated to exclude carcinoma. No focal myometrial abnormalities are seen.  Normal left ovary and non-visualized right ovary   07/30/2012 Surgery   Dr. Polly Cobia performed robotic hysterectomy with bilateral salpingo-oophorectomy, bilateral pelvic lymph node dissection and periaortic lymph node dissection. Intraoperatively on frozen section, the patient was noted to have a large endometrial polyp with changes within the polyp consistent for high-grade malignancy, possibly papillary serous carcinoma. There is no obvious extrauterine disease noted.     07/30/2012 Pathology Results  [phone] SUPPLEMENTAL REPORT  THE ENDOMETRIAL CARCINOMA WAS ANALYZED FOR DNA MISMATCH REPAIR PROTEINS.IMMUNOHISTOCHEMICALLY, THE NEOPLASM RETAINED  NUCLEAR EXPRESSION OF 4 GENE PRODUCTS, MLH1, MSH2, MSH6, AND PMS2, INVOLVED IN DNA MISMATCH REPAIR.POSITIVE AND NEGATIVE CONTROLS WORKED APPROPRIATELY.  PER REQUEST, AN ER AND PR ARE PERFORMED ON BLOCK 1I.  ER- APPROXIMATELY 25-35% STAINING IN NEOPLASTIC CELLS (INTERMEDIATE)  PR- APPROXIMATELY 25-35% STAINING IN NEOPLASTIC CELLS (STRONG)  ER AND PR PREDOMINANTLY SHOW STAINING IN THE SEROUS COMPONENT.  DIAGNOSIS  1. UTERUS, CERVIX WITH BILATERAL FALLOPIAN TUBES AND OVARIES,  HYSTERECTOMY AND BILATERAL SALPINGO-OOPHORECTOMY:  HIGH GRADE/POORLY DIFFERENTIATED ENDOMETRIAL ADENOCARCINOMA WITH SOLID AND SEROUS PAPILLARY COMPONENTS.  HISTOPATHOLOGIC TYPE:A VARIETY OF PATTERNS WERE PRESENT, ALL OF WHICH SHOULD BE CONSIDERED TO BE HIGH GRADE.SEROUS PAPILLARY CARCINOMA WAS SEEN OCCURRING ADJACENT TO SOLID ENDOMETRIAL CARCINOMA WITH MARKED ANAPLASIA AND GIANT CELLS. SIZE:TUMOR MEASURED AT LEAST 4.8 CM IN GREATEST HORIZONTAL DIMENSION.  GRADE: POORLY DIFFERENTIATED OR HIGH GRADE. DEPTH OF INVASION: NO DEFINITE MYOMETRIAL INVOLVEMENT WAS SEEN.THE TUMOR APPEARED CONFINED TO THE POLYP AS WELL AS SURFACE ENDOMETRIUM. WHERE MYOMETRIAL THICKNESS NOT APPLICABLE. SEROSAL INVOLVEMENT: NOT DEMONSTRATED.  ENDOCERVICAL INVOLVEMENT:NOT DEMONSTRATED. RESECTION MARGINS: FREE OF INVOLVEMENT. EXTRAUTERINE EXTENSION:NOT DEMONSTRATED. ANGIOLYMPHATIC INVASION: NOT DEFINITELY SEEN. TOTAL NODES EXAMINED:22.  PELVIC NODES EXAMINED:20.  PELVIC NODES INVOLVED:0.  PARA-AORTIC NODES 2. EXAMINED:  PARA-AORTIC NODES 0. INVOLVED TNM STAGE: T1A N0 MX. AJCC STAGE GROUPING: IA. FIGO STAGE:IA.   08/26/2012 Imaging   No CT evidence for intra-abdominal or pelvic metastatic disease. Trace free pelvic fluid, presumably postoperative although the date of surgery is not documented in the electronic medical record.   08/26/2012 - 12/13/2012  Chemotherapy   She received 6 cycles of carbo/taxol    10/29/2012 - 11/27/2012 Radiation Therapy   May 27, June 5,  June 11, June 19, November 27, 2012: Proximal vagina 30 Gy in 5 fractions     01/17/2013 Imaging   1.  No evidence of recurrent or metastatic disease. 2.  No acute abnormality involving the abdomen or pelvis. 3.  Mild diffuse hepatic steatosis. 4.  Very small supraumbilical midline anterior abdominal wall hernia containing fat, unchanged   01/22/2014 Imaging   No evidence for metastatic or recurrent disease. 2. No bowel obstruction.  Normal appendix. 3. Small fat containing hernia, stable in appearance. 4. Status post hysterectomy and bilateral oophorectomy   02/19/2014 Imaging   No pulmonary lesions are identified. The abnormality on the chest x-ray is due to asymmetric left-sided sternoclavicular joint degenerative disease   10/12/2014 Imaging   New mild retroperitoneal lymphadenopathy in the left paraaortic region and proximal left common iliac chain, consistent with metastatic disease. No other sites of metastatic disease identified within the abdomen or pelvis.     11/27/2014 - 02/11/2015 Chemotherapy   She received 4 cycles of carbo/taxol    03/01/2015 PET scan   Single hypermetabolic small retroperitoneal lymph node along the aorta. 2. No evidence of metastatic disease otherwise in the abdomen or pelvis. No evidence local recurrence. 3. Intensely hypermetabolic enlarged nodule adjacent to the RIGHT lobe of thyroid gland. This presumably represents the biopsied lesion in clinician report which was found to be benign thyroid tissue.   04/05/2015 - 05/13/2015 Radiation Therapy   She received 50.4 gray in 28 fractions with simultaneous integrated boost to 56 gray   06/17/2015 Imaging   No acute process or evidence of metastatic disease in the abdomen or pelvis. Resolution of previously described retroperitoneal adenopathy. 2.  Possible constipation. 3. Atherosclerosis.    06/17/2015 Tumor Marker  Patient's tumor was tested for the following markers: CA125 Results of the tumor marker test revealed 33   08/12/2015 Tumor Marker   Patient's tumor was tested for the following markers: CA125 Results of the tumor marker test revealed 52   08/31/2015 PET scan   Development of right paratracheal hypermetabolic adenopathy, consistent with nodal metastasis. 2. The previously described isolated abdominal retroperitoneal hypermetabolic node has resolved. 3. Persistent hypermetabolic right thyroid nodule, per report previously biopsied. Correlate with those results. 4.  Possible constipation.   09/09/2015 -  Anti-estrogen oral therapy   She has been receiving alternative treatment between megace and tamoxifen   09/09/2015 Tumor Marker   Patient's tumor was tested for the following markers: CA125 Results of the tumor marker test revealed 37.8   09/20/2015 Procedure   Technically successful ultrasound-guided thyroid aspiration biopsy , dominant right nodule   09/20/2015 Pathology Results   THYROID, RIGHT, FINE NEEDLE ASPIRATION (SPECIMEN 1 OF 1 COLLECTED 09/20/15): FINDINGS CONSISTENT WITH BENIGN THYROID NODULE (BETHESDA CATEGORY II).   10/28/2015 Tumor Marker   Patient's tumor was tested for the following markers: CA125 Results of the tumor marker test revealed 53.2   11/04/2015 Imaging   Stable benign right thyroid nodule   12/16/2015 Tumor Marker   Patient's tumor was tested for the following markers: CA125 Results of the tumor marker test revealed 38.1   03/22/2016 PET scan   Interval progression of hypermetabolic right paratracheal lymph node consistent with metastatic involvement. 2. Stable hypermetabolic right thyroid nodule. Reportedly this has been biopsied in the past.   03/23/2016 Tumor Marker   Patient's tumor was tested for the following markers: CA125 Results of the tumor marker test revealed 62.4   04/13/2016 - 06/01/2016 Radiation Therapy   She received  56 Gy to the chest in 28 fractions    05/09/2016 Imaging   No evidence of left lower extremity deep vein thrombosis. No evidence of a superficial thrombosis of the greater and lesser saphenous veins. Positive for thrombus noted in several varicosities of the calf. No evidence of Baker's cyst on the left.   05/17/2016 Tumor Marker   Patient's tumor was tested for the following markers: CA125 Results of the tumor marker test revealed 9   06/29/2016 Tumor Marker   Patient's tumor was tested for the following markers: CA125 Results of the tumor marker test revealed 5.9   08/04/2016 Tumor Marker   Patient's tumor was tested for the following markers: CA125 Results of the tumor marker test revealed 6.0   09/12/2016 PET scan   Complete metabolic response to therapy, with resolution of hypermetabolic mediastinal lymphadenopathy since prior exam. No residual or new metastatic disease identified. Stable hypermetabolic right thyroid lobe nodule, which was previously biopsied on 09/20/2015.   03/13/2017 PET scan   1. Solitary focus of recurrent right paratracheal hypermetabolic activity, with a 1.0 cm right lower paratracheal node having a maximum SUV of 9.0. Appearance compatible with recurrent malignancy. 2. Continued hypermetabolic right thyroid nodule, previously biopsied, presumed benign -correlate with prior biopsy results. 3. Other imaging findings of potential clinical significance: Aortic Atherosclerosis (ICD10-I70.0). Mild cardiomegaly. Prominent stool throughout the colon favors constipation.   04/02/2017 Pathology Results   FINE NEEDLE ASPIRATION, ENDOSCOPIC, (EBUS) 4 R NODE (SPECIMEN 1 OF 2 COLLECTED 04/02/17): MALIGNANT CELLS PRESENT, CONSISTENT WITH CARCINOMA. SEE COMMENT. COMMENT: THE MALIGNANT CELLS ARE POSITIVE FOR P53 AND NEGATIVE FOR ESTROGEN RECEPTOR AND TTF-1. THIS PROFILE IS NON-SPECIFIC, BUT THE P53 POSITIVE STAINING IS SUGGESTIVE OF GYNECOLOGIC PRIMARY.  04/11/2017 Procedure    Successful 8 French right internal jugular vein power port placement with its tip at the SVC/RA junction   04/12/2017 Imaging   Normal LV size with mild LV hypertrophy. EF 55-60%. Normal RV size and systolic function. Aortic valve sclerosis without significant stenosis.   04/18/2017 - 06/14/2017 Chemotherapy   The patient had chemotherapy with Doxil    07/10/2017 Imaging   - Left ventricle: The cavity size was normal. There was mild concentric hypertrophy. Systolic function was normal. The estimated ejection fraction was in the range of 55% to 60%. Wall motion was normal; there were no regional wall motion abnormalities. Doppler parameters are consistent with abnormal left ventricular relaxation (grade 1 diastolic dysfunction). - Aortic valve: Noncoronary cusp mobility was mildly restricted. - Mitral valve: There was mild regurgitation. - Left atrium: The atrium was mildly dilated. - Atrial septum: No defect or patent foramen ovale was identified.  Impressions:  - Compared to November 2018, global LV longitudinal strain remains normal (has increased)   07/11/2017 PET scan   Increased size and hypermetabolic activity of 3.3 cm right thyroid lobe nodule. Thyroid carcinoma cannot be excluded. Recommend repeat ultrasound guided fine needle aspiration to exclude thyroid carcinoma.  New adjacent hypermetabolic 10 mm right supraclavicular lymph node, suspicious for lymph node metastasis.  Slight increase in size and hypermetabolic activity of solitary right paratracheal lymph node.  No evidence of abdominal or pelvic metastatic disease.   07/18/2017 Procedure   1. Technically successful ultrasound guided fine needle aspiration of indeterminate hypermetabolic right-sided thyroid nodule/mass. 2. Technically successful ultrasound-guided core needle biopsy of hypermetabolic right lower cervical lymph node.   07/18/2017 Pathology Results   THYROID, FINE NEEDLE ASPIRATION RIGHT (SPECIMEN 1 OF 1,  COLLECTED ON 07/18/2017): ATYPIA OF UNDETERMINED SIGNIFICANCE OR FOLLICULAR LESION OF UNDETERMINED SIGNIFICANCE (BETHESDA CATEGORY III). SEE COMMENT. COMMENT: THE SPECIMEN CONSISTS OF SMALL AND MEDIUM SIZED GROUPS OF FOLLICULAR EPITHELIAL CELLS WITH MILD CYTOLOGIC ATYPIA INCLUDING NUCLEAR ENLARGEMENT AND HURTHLE CELL CHANGE. SOME GROUPS ARE ARRANGED AS MICROFOLLICLES. THERE IS MINIMAL BACKGROUND COLLOID. BASED ON THESE FEATURES, A FOLLICULAR LESION/NEOPLASM CAN NOT BE ENTIRELY RULED OUT. A SPECIMEN WILL BE SENT FOR AFIRMA TESTING.   07/19/2017 Pathology Results   Lymph node, needle/core biopsy - METASTATIC PAPILLARY SEROUS CARCINOMA. - SEE COMMENT. Microscopic Comment Dr. Vicente Males has reviewed the case and concurs with this interpretation   10/15/2017 PET scan   No new or progressive disease. No evidence of abdominal or pelvic metastatic disease.  Stable hypermetabolic right thyroid lobe nodule and adjacent right supraclavicular lymph node.  Decreased size and hypermetabolic activity of solitary right paratracheal lymph node.   01/23/2018 PET scan   1. Overall, no significant change from PET-CT of 3 months ago. There is persistent hypermetabolic activity at the right thoracic inlet and within a right paratracheal mediastinal node. The nodes have not significantly changed in size, although the metabolic activity has minimally increased over this interval. It is uncertain how much of the thoracic inlet metabolic activity is attributed to the thyroid nodule versus adjacent cervical lymph nodes, both previously biopsied. 2. No new hypermetabolic activity within the neck, chest, abdomen or pelvis. No evidence of local recurrence in the pelvis. 3. Stable probable radiation changes in the right lung.   04/16/2018 PET scan   1. Interval increase in metabolic activity of the RIGHT supraclavicular node and RIGHT lower paratracheal mediastinal node with minimal change in size. Findings consistent with  persistent and mildly progressed metastatic adenopathy. 2. New hypermetabolic  small lymph node in the RIGHT lower paratracheal nodal station adjacent to the previously followed node. 3. No evidence of local recurrence in the pelvis or new disease elsewhere.   07/30/2018 PET scan   1. Mild interval progression of right supraclavicular, mediastinal, and right hilar hypermetabolic metastatic disease. There is a new hypermetabolic lymph node in the subcarinal station on today's study. 2. No hypermetabolic disease in the abdomen or pelvis to suggest recurrent/metastases. 3.  Aortic Atherosclerois (ICD10-170.0)    08/16/2018 -  Chemotherapy   The patient had Lenvima since 3/6 and pembrolizumab since 3/13 for chemotherapy treatment. Michel Santee was held temporarily due to infection and severe hypertension   10/31/2018 PET scan   Partial response to therapy, with decreased hypermetabolic lymphadenopathy in mediastinum and right hilar region.  No significant change in hypermetabolic lymphadenopathy in right inferior neck.  No new or progressive metastatic disease identified. No evidence of recurrent or metastatic carcinoma in the pelvis or abdomen   02/04/2019 PET scan   1. Decrease in metabolic activity of RIGHT supraclavicular node and RIGHT paratracheal lymph nodes. 2. Persistent activity in the RIGHT thyroid nodule unchanged. 3. Diffuse activity through the esophagus is favored esophagitis. No change. 4. Scattered foci of intense metabolic activity associated the bowel without focal lesion on CT favored benign. 5. No evidence of peritoneal metastasis. 6. No evidence of metastatic adenopathy in the abdomen pelvis. 7. No evidence of local pelvic sidewall recurrence.   07/30/2019 PET scan   1. Mildly increased uptake within small nodes along the right sternocleidomastoid without change in size and associated with increased deltoid activity may reflect changes related to right arm injection or recent  COVID vaccination, consider clinical correlation and attention on follow-up. 2. Persistent activity in the right thyroid nodule, unchanged from previous exams. 3. New activity in a juxta esophageal lymph node in the chest is suspicious though the node is unchanged with respect to size. Endoscopic correlation could potentially be helpful or close attention on follow-up. 4. Subtle increased FDG uptake along the left mainstem bronchus and adjacent to the aorta may represent a small lymph node. No discrete correlate is demonstrated on today's study.   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      REVIEW OF SYSTEMS:   Constitutional: Denies fevers, chills or abnormal weight loss Eyes: Denies blurriness of vision Ears, nose, mouth, throat, and face: Denies mucositis or sore throat Respiratory: Denies cough, dyspnea or wheezes Cardiovascular: Denies palpitation, chest discomfort or lower extremity swelling Gastrointestinal:  Denies nausea, heartburn or change in bowel habits Skin: Denies abnormal skin rashes Lymphatics: Denies new lymphadenopathy or easy bruising Neurological:Denies numbness, tingling or new weaknesses Behavioral/Psych: Mood is stable, no new changes  All other systems were reviewed with the patient and are negative.  I have reviewed the past medical history, past surgical history, social history and family history with the patient and they are unchanged from previous note.  ALLERGIES:  has No Known Allergies.  MEDICATIONS:   Current Outpatient Medications  Medication Sig Dispense Refill  . amLODipine (NORVASC) 10 MG tablet TAKE  1 TABLET BY MOUTH DAILY. DECREASE SIMVASTATIN TO 20MG DAILY WHILE ON AMLODIPINE PER MD 90 tablet 1  . busPIRone (BUSPAR) 10 MG tablet Take 10 mg by mouth 2 (two) times daily.    . Calcium Carb-Cholecalciferol 500-600 MG-UNIT TABS Take 1 tablet by mouth daily.     . Cholecalciferol (VITAMIN D3) 2000 units TABS Take 2,000 Units by mouth daily.     . hydrochlorothiazide (HYDRODIURIL) 50 MG tablet TAKE 1 TABLET (50 MG TOTAL) BY MOUTH DAILY. 90 tablet 11  . lenvatinib 10 mg daily dose (LENVIMA, 10 MG DAILY DOSE,) capsule Take 1 capsule (10 mg total) by mouth daily. 30 capsule 11  . levothyroxine (SYNTHROID) 75 MCG tablet Take 1 tablet (75 mcg total) by mouth daily before breakfast. 90 tablet 2  . LORazepam (ATIVAN) 0.5 MG tablet Take 0.5 mg by mouth 2 (two) times daily as needed.  0  . losartan (COZAAR) 50 MG tablet Take 1 tablet (50 mg total) by mouth daily. 90 tablet 11  . metoprolol tartrate (LOPRESSOR) 25 MG tablet Take 1 tablet (25 mg total) by mouth 2 (two) times daily. 180 tablet 3  . ondansetron (ZOFRAN) 8 MG tablet Take 1 tablet (8 mg total) by mouth every 8 (eight) hours as needed for nausea. 30 tablet 3  . pantoprazole (PROTONIX) 40 MG tablet Take 1 tablet (40 mg total) by mouth daily. 90 tablet 3  . simvastatin (ZOCOR) 40 MG tablet Take 40 mg by mouth every evening.    . tacrolimus (PROTOPIC) 0.1 % ointment Apply 1 application topically daily as needed (rash).     . venlafaxine XR (EFFEXOR-XR) 75 MG 24 hr capsule Take 300 mg by mouth daily with breakfast.      No current facility-administered medications for this visit.   Facility-Administered Medications Ordered in Other Visits  Medication Dose Route Frequency Provider Last Rate Last Admin  . heparin lock flush 100 unit/mL  500 Units Intracatheter Once PRN Alvy Bimler, Christoper Bushey, MD      . pembrolizumab (KEYTRUDA) 200 mg in sodium chloride  0.9 % 50 mL chemo infusion  200 mg Intravenous Once Taniaya Rudder, MD      . sodium chloride flush (NS) 0.9 % injection 10 mL  10 mL Intracatheter PRN Alvy Bimler, Jeramia Saleeby, MD        PHYSICAL EXAMINATION: ECOG PERFORMANCE STATUS: 0 - Asymptomatic  Vitals:   09/11/19 1146  BP: 120/66  Pulse: 71  Resp: 18  Temp: 98.2 F (36.8 C)  SpO2: 99%   Filed Weights   09/11/19 1146  Weight: 158 lb 12.8 oz (72 kg)    GENERAL:alert, no distress and comfortable SKIN: skin color, texture, turgor are normal, no rashes or significant lesions EYES: normal, Conjunctiva are pink and non-injected, sclera clear OROPHARYNX:no exudate, no erythema and lips, buccal mucosa, and tongue normal  NECK: supple, thyroid normal size, non-tender, without nodularity LYMPH:  no palpable lymphadenopathy in the cervical, axillary or inguinal LUNGS: clear to auscultation and percussion with normal breathing effort HEART: regular rate & rhythm and no murmurs and no lower extremity edema ABDOMEN:abdomen soft, non-tender and normal bowel sounds Musculoskeletal:no cyanosis of digits and no clubbing  NEURO: alert & oriented x 3 with fluent speech, no focal motor/sensory deficits  LABORATORY DATA:  I have reviewed the data as listed    Component Value Date/Time   NA 139 09/11/2019 1119   NA 141 05/17/2017 0957   K 3.9 09/11/2019 1119   K 3.6 05/17/2017 0957   CL  103 09/11/2019 1119   CL 105 11/19/2012 0848   CO2 28 09/11/2019 1119   CO2 25 05/17/2017 0957   GLUCOSE 86 09/11/2019 1119   GLUCOSE 85 05/17/2017 0957   GLUCOSE 122 (H) 11/19/2012 0848   BUN 29 (H) 09/11/2019 1119   BUN 15.2 05/17/2017 0957   CREATININE 0.84 09/11/2019 1119   CREATININE 0.83 07/10/2019 1240   CREATININE 0.8 05/17/2017 0957   CALCIUM 9.4 09/11/2019 1119   CALCIUM 9.6 05/17/2017 0957   PROT 7.0 09/11/2019 1119   PROT 7.0 05/17/2017 0957   ALBUMIN 3.6 09/11/2019 1119   ALBUMIN 3.9 05/17/2017 0957   AST 21 09/11/2019 1119   AST 23 07/10/2019  1240   AST 25 05/17/2017 0957   ALT 19 09/11/2019 1119   ALT 20 07/10/2019 1240   ALT 18 05/17/2017 0957   ALKPHOS 51 09/11/2019 1119   ALKPHOS 57 05/17/2017 0957   BILITOT 0.4 09/11/2019 1119   BILITOT 0.4 07/10/2019 1240   BILITOT 0.47 05/17/2017 0957   GFRNONAA >60 09/11/2019 1119   GFRNONAA >60 07/10/2019 1240   GFRAA >60 09/11/2019 1119   GFRAA >60 07/10/2019 1240    No results found for: SPEP, UPEP  Lab Results  Component Value Date   WBC 3.8 (L) 09/11/2019   NEUTROABS 2.5 09/11/2019   HGB 10.4 (L) 09/11/2019   HCT 32.8 (L) 09/11/2019   MCV 99.4 09/11/2019   PLT 167 09/11/2019      Chemistry      Component Value Date/Time   NA 139 09/11/2019 1119   NA 141 05/17/2017 0957   K 3.9 09/11/2019 1119   K 3.6 05/17/2017 0957   CL 103 09/11/2019 1119   CL 105 11/19/2012 0848   CO2 28 09/11/2019 1119   CO2 25 05/17/2017 0957   BUN 29 (H) 09/11/2019 1119   BUN 15.2 05/17/2017 0957   CREATININE 0.84 09/11/2019 1119   CREATININE 0.83 07/10/2019 1240   CREATININE 0.8 05/17/2017 0957   GLU 158 (H) 01/14/2015 1035      Component Value Date/Time   CALCIUM 9.4 09/11/2019 1119   CALCIUM 9.6 05/17/2017 0957   ALKPHOS 51 09/11/2019 1119   ALKPHOS 57 05/17/2017 0957   AST 21 09/11/2019 1119   AST 23 07/10/2019 1240   AST 25 05/17/2017 0957   ALT 19 09/11/2019 1119   ALT 20 07/10/2019 1240   ALT 18 05/17/2017 0957   BILITOT 0.4 09/11/2019 1119   BILITOT 0.4 07/10/2019 1240   BILITOT 0.47 05/17/2017 0957

## 2019-09-12 ENCOUNTER — Telehealth: Payer: Self-pay | Admitting: Hematology and Oncology

## 2019-09-12 NOTE — Telephone Encounter (Signed)
Scheduled per 4/8 sch msg. Called pt and left msg. Mailing printout

## 2019-09-17 ENCOUNTER — Other Ambulatory Visit: Payer: Self-pay | Admitting: Hematology and Oncology

## 2019-09-24 ENCOUNTER — Other Ambulatory Visit: Payer: Self-pay | Admitting: Hematology and Oncology

## 2019-09-24 MED FILL — LENVIMA 10 MG DAILY DOSE: 10 | 30 days supply | Qty: 30 | Fill #0

## 2019-09-25 MED FILL — LOSARTAN POTASSIUM 50 MG TA: 50 | 30 days supply | Qty: 30 | Fill #0

## 2019-09-25 MED FILL — AMLODIPINE BESYLATE 10 MG T: 10 | 90 days supply | Qty: 90 | Fill #0

## 2019-09-26 NOTE — Progress Notes (Signed)
Pharmacist Chemotherapy Monitoring - Follow Up Assessment    I verify that I have reviewed each item in the below checklist:  . Regimen for the patient is scheduled for the appropriate day and plan matches scheduled date. Marland Kitchen Appropriate non-routine labs are ordered dependent on drug ordered. . If applicable, additional medications reviewed and ordered per protocol based on lifetime cumulative doses and/or treatment regimen.   Plan for follow-up and/or issues identified: No . I-vent associated with next due treatment: No . MD and/or nursing notified: No   Kennith Center, Pharm.D., CPP 09/26/2019@4 :22 PM

## 2019-09-29 MED FILL — METOPROLOL TARTRATE 25 MG T: 25 | 90 days supply | Qty: 180 | Fill #3

## 2019-10-02 ENCOUNTER — Inpatient Hospital Stay: Payer: 59

## 2019-10-02 ENCOUNTER — Other Ambulatory Visit: Payer: Self-pay

## 2019-10-02 VITALS — BP 114/63 | HR 82 | Temp 98.7°F | Resp 18

## 2019-10-02 DIAGNOSIS — I10 Essential (primary) hypertension: Secondary | ICD-10-CM | POA: Diagnosis not present

## 2019-10-02 DIAGNOSIS — G35 Multiple sclerosis: Secondary | ICD-10-CM | POA: Diagnosis not present

## 2019-10-02 DIAGNOSIS — R946 Abnormal results of thyroid function studies: Secondary | ICD-10-CM | POA: Diagnosis not present

## 2019-10-02 DIAGNOSIS — C541 Malignant neoplasm of endometrium: Secondary | ICD-10-CM

## 2019-10-02 DIAGNOSIS — E039 Hypothyroidism, unspecified: Secondary | ICD-10-CM | POA: Diagnosis not present

## 2019-10-02 DIAGNOSIS — C770 Secondary and unspecified malignant neoplasm of lymph nodes of head, face and neck: Secondary | ICD-10-CM | POA: Diagnosis not present

## 2019-10-02 DIAGNOSIS — E041 Nontoxic single thyroid nodule: Secondary | ICD-10-CM | POA: Diagnosis not present

## 2019-10-02 DIAGNOSIS — Z5112 Encounter for antineoplastic immunotherapy: Secondary | ICD-10-CM | POA: Diagnosis not present

## 2019-10-02 DIAGNOSIS — Z7189 Other specified counseling: Secondary | ICD-10-CM

## 2019-10-02 DIAGNOSIS — D61818 Other pancytopenia: Secondary | ICD-10-CM | POA: Diagnosis not present

## 2019-10-02 LAB — CBC WITH DIFFERENTIAL/PLATELET
Abs Immature Granulocytes: 0.01 10*3/uL (ref 0.00–0.07)
Basophils Absolute: 0.1 10*3/uL (ref 0.0–0.1)
Basophils Relative: 1 %
Eosinophils Absolute: 0.2 10*3/uL (ref 0.0–0.5)
Eosinophils Relative: 4 %
HCT: 33.3 % — ABNORMAL LOW (ref 36.0–46.0)
Hemoglobin: 10.7 g/dL — ABNORMAL LOW (ref 12.0–15.0)
Immature Granulocytes: 0 %
Lymphocytes Relative: 23 %
Lymphs Abs: 0.9 10*3/uL (ref 0.7–4.0)
MCH: 31 pg (ref 26.0–34.0)
MCHC: 32.1 g/dL (ref 30.0–36.0)
MCV: 96.5 fL (ref 80.0–100.0)
Monocytes Absolute: 0.3 10*3/uL (ref 0.1–1.0)
Monocytes Relative: 7 %
Neutro Abs: 2.6 10*3/uL (ref 1.7–7.7)
Neutrophils Relative %: 65 %
Platelets: 175 10*3/uL (ref 150–400)
RBC: 3.45 MIL/uL — ABNORMAL LOW (ref 3.87–5.11)
RDW: 12.8 % (ref 11.5–15.5)
WBC: 4.1 10*3/uL (ref 4.0–10.5)
nRBC: 0 % (ref 0.0–0.2)

## 2019-10-02 LAB — COMPREHENSIVE METABOLIC PANEL
ALT: 19 U/L (ref 0–44)
AST: 24 U/L (ref 15–41)
Albumin: 3.7 g/dL (ref 3.5–5.0)
Alkaline Phosphatase: 49 U/L (ref 38–126)
Anion gap: 11 (ref 5–15)
BUN: 21 mg/dL (ref 8–23)
CO2: 27 mmol/L (ref 22–32)
Calcium: 9.4 mg/dL (ref 8.9–10.3)
Chloride: 102 mmol/L (ref 98–111)
Creatinine, Ser: 0.85 mg/dL (ref 0.44–1.00)
GFR calc Af Amer: >60 mL/min (ref >60)
GFR calc non Af Amer: >60 mL/min (ref >60)
Glucose, Bld: 102 mg/dL — ABNORMAL HIGH (ref 70–99)
Potassium: 3.9 mmol/L (ref 3.5–5.1)
Sodium: 140 mmol/L (ref 135–145)
Total Bilirubin: 0.4 mg/dL (ref 0.3–1.2)
Total Protein: 7.2 g/dL (ref 6.5–8.1)

## 2019-10-02 LAB — TSH: TSH: 2.842 u[IU]/mL (ref 0.308–3.960)

## 2019-10-02 LAB — TOTAL PROTEIN, URINE DIPSTICK: Protein, ur: NEGATIVE mg/dL

## 2019-10-02 MED ORDER — HEPARIN SOD (PORK) LOCK FLUSH 100 UNIT/ML IV SOLN
500.0000 [IU] | Freq: Once | INTRAVENOUS | Status: AC | PRN
  Administered 2019-10-02: 500 [IU]
  Filled 2019-10-02: qty 5

## 2019-10-02 MED ORDER — SODIUM CHLORIDE 0.9% FLUSH
10.0000 mL | INTRAVENOUS | Status: DC | PRN
  Administered 2019-10-02: 10 mL
  Filled 2019-10-02: qty 10

## 2019-10-02 MED ORDER — SODIUM CHLORIDE 0.9 % IV SOLN
200.0000 mg | Freq: Once | INTRAVENOUS | Status: AC
  Administered 2019-10-02: 200 mg via INTRAVENOUS
  Filled 2019-10-02: qty 8

## 2019-10-02 MED ORDER — SODIUM CHLORIDE 0.9 % IV SOLN
Freq: Once | INTRAVENOUS | Status: AC
  Filled 2019-10-02: qty 250

## 2019-10-02 NOTE — Patient Instructions (Signed)
[facility] Discharge Instructions for Patients Receiving Chemotherapy  Today you received the following chemotherapy agents: Pembrolizumab   To help prevent nausea and vomiting after your treatment, we encourage you to take your nausea medication as directed.    If you develop nausea and vomiting that is not controlled by your nausea medication, call the clinic.   BELOW ARE SYMPTOMS THAT SHOULD BE REPORTED IMMEDIATELY:  *FEVER GREATER THAN 100.5 F  *CHILLS WITH OR WITHOUT FEVER  NAUSEA AND VOMITING THAT IS NOT CONTROLLED WITH YOUR NAUSEA MEDICATION  *UNUSUAL SHORTNESS OF BREATH  *UNUSUAL BRUISING OR BLEEDING  TENDERNESS IN MOUTH AND THROAT WITH OR WITHOUT PRESENCE OF ULCERS  *URINARY PROBLEMS  *BOWEL PROBLEMS  UNUSUAL RASH Items with * indicate a potential emergency and should be followed up as soon as possible.  Feel free to call the clinic should you have any questions or concerns. The clinic phone number is [phone].  Please show the CHEMO ALERT CARD at check-in to the Emergency Department and triage nurse.   

## 2019-10-17 NOTE — Progress Notes (Signed)
Pharmacist Chemotherapy Monitoring - Follow Up Assessment    I verify that I have reviewed each item in the below checklist:  . Regimen for the patient is scheduled for the appropriate day and plan matches scheduled date. Marland Kitchen Appropriate non-routine labs are ordered dependent on drug ordered. . If applicable, additional medications reviewed and ordered per protocol based on lifetime cumulative doses and/or treatment regimen.   Plan for follow-up and/or issues identified: No . I-vent associated with next due treatment: No . MD and/or nursing notified: No   Kennith Center, Pharm.D., CPP 10/17/2019@12 :30 PM

## 2019-10-22 ENCOUNTER — Other Ambulatory Visit: Payer: Self-pay

## 2019-10-22 ENCOUNTER — Ambulatory Visit (HOSPITAL_COMMUNITY)
Admission: RE | Admit: 2019-10-22 | Discharge: 2019-10-22 | Disposition: A | Discharge status: Home / Self Care | Payer: 59 | Source: Ambulatory Visit | Attending: Hematology and Oncology | Admitting: Hematology and Oncology

## 2019-10-22 DIAGNOSIS — C55 Malignant neoplasm of uterus, part unspecified: Secondary | ICD-10-CM | POA: Diagnosis not present

## 2019-10-22 DIAGNOSIS — C541 Malignant neoplasm of endometrium: Secondary | ICD-10-CM | POA: Diagnosis not present

## 2019-10-22 LAB — GLUCOSE, CAPILLARY: Glucose-Capillary: 86 mg/dL (ref 70–99)

## 2019-10-22 MED ORDER — FLUDEOXYGLUCOSE F - 18 (FDG) INJECTION
7.9100 | Freq: Once | INTRAVENOUS | Status: AC | PRN
  Administered 2019-10-22: 7.91 via INTRAVENOUS

## 2019-10-22 MED FILL — LENVIMA 10 MG DAILY DOSE: 10 | 30 days supply | Qty: 30 | Fill #1

## 2019-10-22 MED FILL — LOSARTAN POTASSIUM 50 MG TA: 50 | 30 days supply | Qty: 30 | Fill #1

## 2019-10-23 ENCOUNTER — Inpatient Hospital Stay: Payer: 59

## 2019-10-23 ENCOUNTER — Telehealth: Payer: Self-pay | Admitting: Hematology and Oncology

## 2019-10-23 ENCOUNTER — Inpatient Hospital Stay (HOSPITAL_BASED_OUTPATIENT_CLINIC_OR_DEPARTMENT_OTHER): Payer: 59 | Admitting: Hematology and Oncology

## 2019-10-23 ENCOUNTER — Encounter: Payer: Self-pay | Admitting: Hematology and Oncology

## 2019-10-23 ENCOUNTER — Other Ambulatory Visit: Payer: Self-pay

## 2019-10-23 ENCOUNTER — Inpatient Hospital Stay: Payer: 59 | Attending: Hematology and Oncology

## 2019-10-23 DIAGNOSIS — I10 Essential (primary) hypertension: Secondary | ICD-10-CM | POA: Diagnosis not present

## 2019-10-23 DIAGNOSIS — Z9079 Acquired absence of other genital organ(s): Secondary | ICD-10-CM | POA: Insufficient documentation

## 2019-10-23 DIAGNOSIS — Z923 Personal history of irradiation: Secondary | ICD-10-CM | POA: Insufficient documentation

## 2019-10-23 DIAGNOSIS — G35 Multiple sclerosis: Secondary | ICD-10-CM | POA: Diagnosis not present

## 2019-10-23 DIAGNOSIS — Z90722 Acquired absence of ovaries, bilateral: Secondary | ICD-10-CM | POA: Insufficient documentation

## 2019-10-23 DIAGNOSIS — E039 Hypothyroidism, unspecified: Secondary | ICD-10-CM

## 2019-10-23 DIAGNOSIS — Z5112 Encounter for antineoplastic immunotherapy: Secondary | ICD-10-CM | POA: Diagnosis not present

## 2019-10-23 DIAGNOSIS — Z171 Estrogen receptor negative status [ER-]: Secondary | ICD-10-CM | POA: Diagnosis not present

## 2019-10-23 DIAGNOSIS — Z79899 Other long term (current) drug therapy: Secondary | ICD-10-CM | POA: Diagnosis not present

## 2019-10-23 DIAGNOSIS — Z7189 Other specified counseling: Secondary | ICD-10-CM

## 2019-10-23 DIAGNOSIS — Z95828 Presence of other vascular implants and grafts: Secondary | ICD-10-CM

## 2019-10-23 DIAGNOSIS — E041 Nontoxic single thyroid nodule: Secondary | ICD-10-CM | POA: Insufficient documentation

## 2019-10-23 DIAGNOSIS — K2100 Gastro-esophageal reflux disease with esophagitis, without bleeding: Secondary | ICD-10-CM

## 2019-10-23 DIAGNOSIS — Z9221 Personal history of antineoplastic chemotherapy: Secondary | ICD-10-CM | POA: Diagnosis not present

## 2019-10-23 DIAGNOSIS — Z90710 Acquired absence of both cervix and uterus: Secondary | ICD-10-CM | POA: Insufficient documentation

## 2019-10-23 DIAGNOSIS — C541 Malignant neoplasm of endometrium: Secondary | ICD-10-CM

## 2019-10-23 DIAGNOSIS — C770 Secondary and unspecified malignant neoplasm of lymph nodes of head, face and neck: Secondary | ICD-10-CM | POA: Diagnosis not present

## 2019-10-23 LAB — CBC WITH DIFFERENTIAL/PLATELET
Abs Immature Granulocytes: 0.01 10*3/uL (ref 0.00–0.07)
Basophils Absolute: 0.1 10*3/uL (ref 0.0–0.1)
Basophils Relative: 1 %
Eosinophils Absolute: 0.2 10*3/uL (ref 0.0–0.5)
Eosinophils Relative: 3 %
HCT: 34.4 % — ABNORMAL LOW (ref 36.0–46.0)
Hemoglobin: 11.2 g/dL — ABNORMAL LOW (ref 12.0–15.0)
Immature Granulocytes: 0 %
Lymphocytes Relative: 11 %
Lymphs Abs: 0.7 10*3/uL (ref 0.7–4.0)
MCH: 31.4 pg (ref 26.0–34.0)
MCHC: 32.6 g/dL (ref 30.0–36.0)
MCV: 96.4 fL (ref 80.0–100.0)
Monocytes Absolute: 0.4 10*3/uL (ref 0.1–1.0)
Monocytes Relative: 6 %
Neutro Abs: 5.1 10*3/uL (ref 1.7–7.7)
Neutrophils Relative %: 79 %
Platelets: 192 10*3/uL (ref 150–400)
RBC: 3.57 MIL/uL — ABNORMAL LOW (ref 3.87–5.11)
RDW: 13 % (ref 11.5–15.5)
WBC: 6.5 10*3/uL (ref 4.0–10.5)
nRBC: 0 % (ref 0.0–0.2)

## 2019-10-23 LAB — COMPREHENSIVE METABOLIC PANEL
ALT: 18 U/L (ref 0–44)
AST: 23 U/L (ref 15–41)
Albumin: 3.9 g/dL (ref 3.5–5.0)
Alkaline Phosphatase: 53 U/L (ref 38–126)
Anion gap: 9 (ref 5–15)
BUN: 26 mg/dL — ABNORMAL HIGH (ref 8–23)
CO2: 27 mmol/L (ref 22–32)
Calcium: 9.8 mg/dL (ref 8.9–10.3)
Chloride: 102 mmol/L (ref 98–111)
Creatinine, Ser: 1.12 mg/dL — ABNORMAL HIGH (ref 0.44–1.00)
GFR calc Af Amer: 59 mL/min — ABNORMAL LOW (ref >60)
GFR calc non Af Amer: 51 mL/min — ABNORMAL LOW (ref >60)
Glucose, Bld: 101 mg/dL — ABNORMAL HIGH (ref 70–99)
Potassium: 3.7 mmol/L (ref 3.5–5.1)
Sodium: 138 mmol/L (ref 135–145)
Total Bilirubin: 0.5 mg/dL (ref 0.3–1.2)
Total Protein: 7.5 g/dL (ref 6.5–8.1)

## 2019-10-23 LAB — TSH: TSH: 3.277 u[IU]/mL (ref 0.308–3.960)

## 2019-10-23 LAB — TOTAL PROTEIN, URINE DIPSTICK: Protein, ur: 30 mg/dL — AB

## 2019-10-23 MED ORDER — SODIUM CHLORIDE 0.9% FLUSH
10.0000 mL | Freq: Once | INTRAVENOUS | Status: AC
  Administered 2019-10-23: 10 mL
  Filled 2019-10-23: qty 10

## 2019-10-23 MED ORDER — SODIUM CHLORIDE 0.9% FLUSH
10.0000 mL | INTRAVENOUS | Status: DC | PRN
  Administered 2019-10-23: 10 mL
  Filled 2019-10-23: qty 10

## 2019-10-23 MED ORDER — SODIUM CHLORIDE 0.9 % IV SOLN
200.0000 mg | Freq: Once | INTRAVENOUS | Status: AC
  Administered 2019-10-23: 200 mg via INTRAVENOUS
  Filled 2019-10-23: qty 8

## 2019-10-23 MED ORDER — SODIUM CHLORIDE 0.9 % IV SOLN
Freq: Once | INTRAVENOUS | Status: AC
  Filled 2019-10-23: qty 250

## 2019-10-23 MED ORDER — HEPARIN SOD (PORK) LOCK FLUSH 100 UNIT/ML IV SOLN
500.0000 [IU] | Freq: Once | INTRAVENOUS | Status: AC | PRN
  Administered 2019-10-23: 500 [IU]
  Filled 2019-10-23: qty 5

## 2019-10-23 NOTE — Patient Instructions (Signed)
[facility] Discharge Instructions for Patients Receiving Chemotherapy  Today you received the following Immunotherapy agent: Pembrolizumab (Keytruda)  To help prevent nausea and vomiting after your treatment, we encourage you to take your nausea medication as directed by your MD.   If you develop nausea and vomiting that is not controlled by your nausea medication, call the clinic.   BELOW ARE SYMPTOMS THAT SHOULD BE REPORTED IMMEDIATELY:  *FEVER GREATER THAN 100.5 F  *CHILLS WITH OR WITHOUT FEVER  NAUSEA AND VOMITING THAT IS NOT CONTROLLED WITH YOUR NAUSEA MEDICATION  *UNUSUAL SHORTNESS OF BREATH  *UNUSUAL BRUISING OR BLEEDING  TENDERNESS IN MOUTH AND THROAT WITH OR WITHOUT PRESENCE OF ULCERS  *URINARY PROBLEMS  *BOWEL PROBLEMS  UNUSUAL RASH Items with * indicate a potential emergency and should be followed up as soon as possible.  Feel free to call the clinic should you have any questions or concerns. The clinic phone number is [phone].  Please show the CHEMO ALERT CARD at check-in to the Emergency Department and triage nurse.  Coronavirus (COVID-19) Are you at risk?  Are you at risk for the Coronavirus (COVID-19)?  To be considered HIGH RISK for Coronavirus (COVID-19), you have to meet the following criteria:  . Traveled to [place], [place], [place], [place] or [place]; or in the [place] to [place], [place], [place], or [place]; and have fever, cough, and shortness of breath within the last 2 weeks of travel OR . Been in close contact with a person diagnosed with COVID-19 within the last 2 weeks and have fever, cough, and shortness of breath . IF YOU DO NOT MEET THESE CRITERIA, YOU ARE CONSIDERED LOW RISK FOR COVID-19.  What to do if you are HIGH RISK for COVID-19?  . If you are having a medical emergency, call 911. . Seek medical care right away. Before you go to a doctor's office, urgent care or emergency department, call ahead and  tell them about your recent travel, contact with someone diagnosed with COVID-19, and your symptoms. You should receive instructions from your physician's office regarding next steps of care.  . When you arrive at healthcare provider, tell the healthcare staff immediately you have returned from visiting [place], [place], [place], [place] or [place]; or traveled in the [place] to [place], [place], [place], or [place]; in the last two weeks or you have been in close contact with a person diagnosed with COVID-19 in the last 2 weeks.   . Tell the health care staff about your symptoms: fever, cough and shortness of breath. . After you have been seen by a medical provider, you will be either: o Tested for (COVID-19) and discharged home on quarantine except to seek medical care if symptoms worsen, and asked to  - Stay home and avoid contact with others until you get your results (4-5 days)  - Avoid travel on public transportation if possible (such as bus, train, or airplane) or o Sent to the Emergency Department by EMS for evaluation, COVID-19 testing, and possible admission depending on your condition and test results.  What to do if you are LOW RISK for COVID-19?  Reduce your risk of any infection by using the same precautions used for avoiding the common cold or flu:  . Wash your hands often with soap and warm water for at least 20 seconds.  If soap and water are not readily available, use an alcohol-based hand sanitizer with at least 60% alcohol.  .   If coughing or sneezing, cover your mouth and nose by coughing or sneezing into the elbow areas of your shirt or coat, into a tissue or into your sleeve (not your hands). . Avoid shaking hands with others and consider head nods or verbal greetings only. . Avoid touching your eyes, nose, or mouth with unwashed hands.  . Avoid close contact with people who are sick. . Avoid places or events with large numbers of people in one location, like  concerts or sporting events. . Carefully consider travel plans you have or are making. . If you are planning any travel outside or inside the [place], visit the CDC's Travelers' Health webpage for the latest health notices. . If you have some symptoms but not all symptoms, continue to monitor at home and seek medical attention if your symptoms worsen. . If you are having a medical emergency, call 911.   ADDITIONAL HEALTHCARE OPTIONS FOR PATIENTS  [facility] / e-Visit: [email or web address]         [facility]: [phone]  [facility]: [phone]                   [facility]: [phone]  

## 2019-10-23 NOTE — Progress Notes (Signed)
[place] OFFICE PROGRESS NOTE  Patient Care Team: [name], MD as PCP - General (Family Medicine) Reynold Bowen, MD as Consulting Physician (Endocrinology)  ASSESSMENT & PLAN:  Endometrial cancer [facility]) I have reviewed PET imaging study from Oct 22, 2019 with the patient which show stable disease She tolerated treatment well without major side effects We will continue treatment indefinitely I will see her every other visit She will continue Lenvima at home and monitor her blood pressure carefully I recommend lengthening the interval between imaging studies to another, and plan to repeat imaging study in 4 months, due around September  Essential hypertension She has intermittent proteinuria even though her blood pressure remains well controlled She will continue aggressive blood pressure management  Esophagitis, reflux PET CT scan show mild esophagitis Symptoms wise, she feels better with pantoprazole We will continue the same  Acquired hypothyroidism She has intermittent elevated TSH I will adjust her thyroid medicine accordingly   No orders of the defined types were placed in this encounter.   All questions were answered. The patient knows to call the clinic with any problems, questions or concerns. The total time spent in the appointment was 20 minutes encounter with patients including review of chart and various tests results, discussions about plan of care and coordination of care plan   [name], MD 10/23/2019 10:47 AM  INTERVAL HISTORY: Please see below for problem oriented charting. She returns for treatment and follow-up She is doing well except for fatigue Her blood pressure control at home is good She is doing well with pantoprazole for treatment of occasional reflux/esophagitis No other new side effects from treatment so far  SUMMARY OF ONCOLOGIC HISTORY: Oncology History Overview Note  [employer] testing done 11-2015 on surgical  path from 2014: MS stable TMB low 4 muts/mb ATM [id number] ERBB3 T389K E2H2 rearrangement exon 9 PPP2R1A P179R TP53 I195N    ER- APPROXIMATELY 25-35% STAINING IN NEOPLASTIC CELLS (INTERMEDIATE)  PR- APPROXIMATELY 25-35% STAINING IN NEOPLASTIC CELLS (STRONG)  Repeat biopsy 04/02/17: ER negative, Her 2 negative  Progressed on Doxil   Endometrial cancer ([place])  06/20/2012 Pathology Results   Biopsy positive for papillary serous carcinoma   06/20/2012 Genetic Testing   Foundation One testing done 11-2015 on surgical path from 2014: MS stable TMB low 4 muts/mb ATM [id number] ERBB3 T389K E2H2 rearrangement exon 9 PPP2R1A P179R TP53 I195N    06/20/2012 Initial Diagnosis   Patient presented to PCP with intermittent vaginal bleeding since ~ Oct 2013, endometrial biopsy 06-20-12 with complex endometrial hyperplasia with atypia   06/26/2012 Imaging   Thickened endometrial lining in a postmenopausal patient experiencing vaginal bleeding. In the setting of post-menopausal bleeding, endometrial sampling is indicated to exclude carcinoma. No focal myometrial abnormalities are seen.  Normal left ovary and non-visualized right ovary   07/30/2012 Surgery   Dr. Polly Cobia performed robotic hysterectomy with bilateral salpingo-oophorectomy, bilateral pelvic lymph node dissection and periaortic lymph node dissection. Intraoperatively on frozen section, the patient was noted to have a large endometrial polyp with changes within the polyp consistent for high-grade malignancy, possibly papillary serous carcinoma. There is no obvious extrauterine disease noted.     07/30/2012 Pathology Results   [phone] SUPPLEMENTAL REPORT  THE ENDOMETRIAL CARCINOMA WAS ANALYZED FOR DNA MISMATCH REPAIR PROTEINS.IMMUNOHISTOCHEMICALLY, THE NEOPLASM RETAINED NUCLEAR EXPRESSION OF 4 GENE PRODUCTS, MLH1, MSH2, MSH6, AND PMS2, INVOLVED IN DNA MISMATCH REPAIR.POSITIVE AND NEGATIVE CONTROLS  WORKED APPROPRIATELY.  PER REQUEST, AN ER AND PR ARE PERFORMED ON BLOCK 1I.  ER- APPROXIMATELY 25-35% STAINING IN NEOPLASTIC CELLS (INTERMEDIATE)  PR- APPROXIMATELY 25-35% STAINING IN NEOPLASTIC CELLS (STRONG)  ER AND PR PREDOMINANTLY SHOW STAINING IN THE SEROUS COMPONENT.  DIAGNOSIS  1. UTERUS, CERVIX WITH BILATERAL FALLOPIAN TUBES AND OVARIES,  HYSTERECTOMY AND BILATERAL SALPINGO-OOPHORECTOMY:  HIGH GRADE/POORLY DIFFERENTIATED ENDOMETRIAL ADENOCARCINOMA WITH SOLID AND SEROUS PAPILLARY COMPONENTS.  HISTOPATHOLOGIC TYPE:A VARIETY OF PATTERNS WERE PRESENT, ALL OF WHICH SHOULD BE CONSIDERED TO BE HIGH GRADE.SEROUS PAPILLARY CARCINOMA WAS SEEN OCCURRING ADJACENT TO SOLID ENDOMETRIAL CARCINOMA WITH MARKED ANAPLASIA AND GIANT CELLS. SIZE:TUMOR MEASURED AT LEAST 4.8 CM IN GREATEST HORIZONTAL DIMENSION.  GRADE: POORLY DIFFERENTIATED OR HIGH GRADE. DEPTH OF INVASION: NO DEFINITE MYOMETRIAL INVOLVEMENT WAS SEEN.THE TUMOR APPEARED CONFINED TO THE POLYP AS WELL AS SURFACE ENDOMETRIUM. WHERE MYOMETRIAL THICKNESS NOT APPLICABLE. SEROSAL INVOLVEMENT: NOT DEMONSTRATED.  ENDOCERVICAL INVOLVEMENT:NOT DEMONSTRATED. RESECTION MARGINS: FREE OF INVOLVEMENT. EXTRAUTERINE EXTENSION:NOT DEMONSTRATED. ANGIOLYMPHATIC INVASION: NOT DEFINITELY SEEN. TOTAL NODES EXAMINED:22.  PELVIC NODES EXAMINED:20.  PELVIC NODES INVOLVED:0.  PARA-AORTIC NODES 2. EXAMINED:  PARA-AORTIC NODES 0. INVOLVED TNM STAGE: T1A N0 MX. AJCC STAGE GROUPING: IA. FIGO STAGE:IA.   08/26/2012 Imaging   No CT evidence for intra-abdominal or pelvic metastatic disease. Trace free pelvic fluid, presumably postoperative although the date of surgery is not documented in the electronic medical record.   08/26/2012 - 12/13/2012 Chemotherapy   She received 6 cycles of carbo/taxol    10/29/2012 - 11/27/2012 Radiation Therapy   May 27, June 5,  June 11,  June 19, November 27, 2012: Proximal vagina 30 Gy in 5 fractions     01/17/2013 Imaging   1.  No evidence of recurrent or metastatic disease. 2.  No acute abnormality involving the abdomen or pelvis. 3.  Mild diffuse hepatic steatosis. 4.  Very small supraumbilical midline anterior abdominal wall hernia containing fat, unchanged   01/22/2014 Imaging   No evidence for metastatic or recurrent disease. 2. No bowel obstruction.  Normal appendix. 3. Small fat containing hernia, stable in appearance. 4. Status post hysterectomy and bilateral oophorectomy   02/19/2014 Imaging   No pulmonary lesions are identified. The abnormality on the chest x-ray is due to asymmetric left-sided sternoclavicular joint degenerative disease   10/12/2014 Imaging   New mild retroperitoneal lymphadenopathy in the left paraaortic region and proximal left common iliac chain, consistent with metastatic disease. No other sites of metastatic disease identified within the abdomen or pelvis.     11/27/2014 - 02/11/2015 Chemotherapy   She received 4 cycles of carbo/taxol    03/01/2015 PET scan   Single hypermetabolic small retroperitoneal lymph node along the aorta. 2. No evidence of metastatic disease otherwise in the abdomen or pelvis. No evidence local recurrence. 3. Intensely hypermetabolic enlarged nodule adjacent to the RIGHT lobe of thyroid gland. This presumably represents the biopsied lesion in clinician report which was found to be benign thyroid tissue.   04/05/2015 - 05/13/2015 Radiation Therapy   She received 50.4 gray in 28 fractions with simultaneous integrated boost to 56 gray   06/17/2015 Imaging   No acute process or evidence of metastatic disease in the abdomen or pelvis. Resolution of previously described retroperitoneal adenopathy. 2.  Possible constipation. 3. Atherosclerosis.   06/17/2015 Tumor Marker   Patient's tumor was tested for the following markers: CA125 Results of the tumor marker test revealed 33    08/12/2015 Tumor Marker   Patient's tumor was tested for the following markers: CA125 Results of the tumor marker test revealed 52   08/31/2015 PET scan   Development of right  paratracheal hypermetabolic adenopathy, consistent with nodal metastasis. 2. The previously described isolated abdominal retroperitoneal hypermetabolic node has resolved. 3. Persistent hypermetabolic right thyroid nodule, per report previously biopsied. Correlate with those results. 4.  Possible constipation.   09/09/2015 -  Anti-estrogen oral therapy   She has been receiving alternative treatment between megace and tamoxifen   09/09/2015 Tumor Marker   Patient's tumor was tested for the following markers: CA125 Results of the tumor marker test revealed 37.8   09/20/2015 Procedure   Technically successful ultrasound-guided thyroid aspiration biopsy , dominant right nodule   09/20/2015 Pathology Results   THYROID, RIGHT, FINE NEEDLE ASPIRATION (SPECIMEN 1 OF 1 COLLECTED 09/20/15): FINDINGS CONSISTENT WITH BENIGN THYROID NODULE (BETHESDA CATEGORY II).   10/28/2015 Tumor Marker   Patient's tumor was tested for the following markers: CA125 Results of the tumor marker test revealed 53.2   11/04/2015 Imaging   Stable benign right thyroid nodule   12/16/2015 Tumor Marker   Patient's tumor was tested for the following markers: CA125 Results of the tumor marker test revealed 38.1   03/22/2016 PET scan   Interval progression of hypermetabolic right paratracheal lymph node consistent with metastatic involvement. 2. Stable hypermetabolic right thyroid nodule. Reportedly this has been biopsied in the past.   03/23/2016 Tumor Marker   Patient's tumor was tested for the following markers: CA125 Results of the tumor marker test revealed 62.4   04/13/2016 - 06/01/2016 Radiation Therapy   She received 56 Gy to the chest in 28 fractions    05/09/2016 Imaging   No evidence of left lower extremity deep vein thrombosis. No evidence of  a superficial thrombosis of the greater and lesser saphenous veins. Positive for thrombus noted in several varicosities of the calf. No evidence of Baker's cyst on the left.   05/17/2016 Tumor Marker   Patient's tumor was tested for the following markers: CA125 Results of the tumor marker test revealed 9   06/29/2016 Tumor Marker   Patient's tumor was tested for the following markers: CA125 Results of the tumor marker test revealed 5.9   08/04/2016 Tumor Marker   Patient's tumor was tested for the following markers: CA125 Results of the tumor marker test revealed 6.0   09/12/2016 PET scan   Complete metabolic response to therapy, with resolution of hypermetabolic mediastinal lymphadenopathy since prior exam. No residual or new metastatic disease identified. Stable hypermetabolic right thyroid lobe nodule, which was previously biopsied on 09/20/2015.   03/13/2017 PET scan   1. Solitary focus of recurrent right paratracheal hypermetabolic activity, with a 1.0 cm right lower paratracheal node having a maximum SUV of 9.0. Appearance compatible with recurrent malignancy. 2. Continued hypermetabolic right thyroid nodule, previously biopsied, presumed benign -correlate with prior biopsy results. 3. Other imaging findings of potential clinical significance: Aortic Atherosclerosis (ICD10-I70.0). Mild cardiomegaly. Prominent stool throughout the colon favors constipation.   04/02/2017 Pathology Results   FINE NEEDLE ASPIRATION, ENDOSCOPIC, (EBUS) 4 R NODE (SPECIMEN 1 OF 2 COLLECTED 04/02/17): MALIGNANT CELLS PRESENT, CONSISTENT WITH CARCINOMA. SEE COMMENT. COMMENT: THE MALIGNANT CELLS ARE POSITIVE FOR P53 AND NEGATIVE FOR ESTROGEN RECEPTOR AND TTF-1. THIS PROFILE IS NON-SPECIFIC, BUT THE P53 POSITIVE STAINING IS SUGGESTIVE OF GYNECOLOGIC PRIMARY.    04/11/2017 Procedure   Successful 8 French right internal jugular vein power port placement with its tip at the SVC/RA junction   04/12/2017 Imaging    Normal LV size with mild LV hypertrophy. EF 55-60%. Normal RV size and systolic function. Aortic valve sclerosis without significant stenosis.  04/18/2017 - 06/14/2017 Chemotherapy   The patient had chemotherapy with Doxil    07/10/2017 Imaging   - Left ventricle: The cavity size was normal. There was mild concentric hypertrophy. Systolic function was normal. The estimated ejection fraction was in the range of 55% to 60%. Wall motion was normal; there were no regional wall motion abnormalities. Doppler parameters are consistent with abnormal left ventricular relaxation (grade 1 diastolic dysfunction). - Aortic valve: Noncoronary cusp mobility was mildly restricted. - Mitral valve: There was mild regurgitation. - Left atrium: The atrium was mildly dilated. - Atrial septum: No defect or patent foramen ovale was identified.  Impressions:  - Compared to November 2018, global LV longitudinal strain remains normal (has increased)   07/11/2017 PET scan   Increased size and hypermetabolic activity of 3.3 cm right thyroid lobe nodule. Thyroid carcinoma cannot be excluded. Recommend repeat ultrasound guided fine needle aspiration to exclude thyroid carcinoma.  New adjacent hypermetabolic 10 mm right supraclavicular lymph node, suspicious for lymph node metastasis.  Slight increase in size and hypermetabolic activity of solitary right paratracheal lymph node.  No evidence of abdominal or pelvic metastatic disease.   07/18/2017 Procedure   1. Technically successful ultrasound guided fine needle aspiration of indeterminate hypermetabolic right-sided thyroid nodule/mass. 2. Technically successful ultrasound-guided core needle biopsy of hypermetabolic right lower cervical lymph node.   07/18/2017 Pathology Results   THYROID, FINE NEEDLE ASPIRATION RIGHT (SPECIMEN 1 OF 1, COLLECTED ON 07/18/2017): ATYPIA OF UNDETERMINED SIGNIFICANCE OR FOLLICULAR LESION OF UNDETERMINED SIGNIFICANCE (BETHESDA CATEGORY  III). SEE COMMENT. COMMENT: THE SPECIMEN CONSISTS OF SMALL AND MEDIUM SIZED GROUPS OF FOLLICULAR EPITHELIAL CELLS WITH MILD CYTOLOGIC ATYPIA INCLUDING NUCLEAR ENLARGEMENT AND HURTHLE CELL CHANGE. SOME GROUPS ARE ARRANGED AS MICROFOLLICLES. THERE IS MINIMAL BACKGROUND COLLOID. BASED ON THESE FEATURES, A FOLLICULAR LESION/NEOPLASM CAN NOT BE ENTIRELY RULED OUT. A SPECIMEN WILL BE SENT FOR AFIRMA TESTING.   07/19/2017 Pathology Results   Lymph node, needle/core biopsy - METASTATIC PAPILLARY SEROUS CARCINOMA. - SEE COMMENT. Microscopic Comment Dr. Vicente Males has reviewed the case and concurs with this interpretation   10/15/2017 PET scan   No new or progressive disease. No evidence of abdominal or pelvic metastatic disease.  Stable hypermetabolic right thyroid lobe nodule and adjacent right supraclavicular lymph node.  Decreased size and hypermetabolic activity of solitary right paratracheal lymph node.   01/23/2018 PET scan   1. Overall, no significant change from PET-CT of 3 months ago. There is persistent hypermetabolic activity at the right thoracic inlet and within a right paratracheal mediastinal node. The nodes have not significantly changed in size, although the metabolic activity has minimally increased over this interval. It is uncertain how much of the thoracic inlet metabolic activity is attributed to the thyroid nodule versus adjacent cervical lymph nodes, both previously biopsied. 2. No new hypermetabolic activity within the neck, chest, abdomen or pelvis. No evidence of local recurrence in the pelvis. 3. Stable probable radiation changes in the right lung.   04/16/2018 PET scan   1. Interval increase in metabolic activity of the RIGHT supraclavicular node and RIGHT lower paratracheal mediastinal node with minimal change in size. Findings consistent with persistent and mildly progressed metastatic adenopathy. 2. New hypermetabolic small lymph node in the RIGHT lower paratracheal  nodal station adjacent to the previously followed node. 3. No evidence of local recurrence in the pelvis or new disease elsewhere.   07/30/2018 PET scan   1. Mild interval progression of right supraclavicular, mediastinal, and right hilar hypermetabolic metastatic disease. There  is a new hypermetabolic lymph node in the subcarinal station on today's study. 2. No hypermetabolic disease in the abdomen or pelvis to suggest recurrent/metastases. 3.  Aortic Atherosclerois (ICD10-170.0)    08/16/2018 -  Chemotherapy   The patient had Lenvima since 3/6 and pembrolizumab since 3/13 for chemotherapy treatment. Michel Santee was held temporarily due to infection and severe hypertension   10/31/2018 PET scan   Partial response to therapy, with decreased hypermetabolic lymphadenopathy in mediastinum and right hilar region.  No significant change in hypermetabolic lymphadenopathy in right inferior neck.  No new or progressive metastatic disease identified. No evidence of recurrent or metastatic carcinoma in the pelvis or abdomen   02/04/2019 PET scan   1. Decrease in metabolic activity of RIGHT supraclavicular node and RIGHT paratracheal lymph nodes. 2. Persistent activity in the RIGHT thyroid nodule unchanged. 3. Diffuse activity through the esophagus is favored esophagitis. No change. 4. Scattered foci of intense metabolic activity associated the bowel without focal lesion on CT favored benign. 5. No evidence of peritoneal metastasis. 6. No evidence of metastatic adenopathy in the abdomen pelvis. 7. No evidence of local pelvic sidewall recurrence.   07/30/2019 PET scan   1. Mildly increased uptake within small nodes along the right sternocleidomastoid without change in size and associated with increased deltoid activity may reflect changes related to right arm injection or recent COVID vaccination, consider clinical correlation and attention on follow-up. 2. Persistent activity in the right thyroid  nodule, unchanged from previous exams. 3. New activity in a juxta esophageal lymph node in the chest is suspicious though the node is unchanged with respect to size. Endoscopic correlation could potentially be helpful or close attention on follow-up. 4. Subtle increased FDG uptake along the left mainstem bronchus and adjacent to the aorta may represent a small lymph node. No discrete correlate is demonstrated on today's study.   10/22/2019 PET scan   1. Persistent FDG avid subcentimeter right supraclavicular and juxta esophageal lymph nodes. The degree of FDG uptake is similar to the previous exam. No new or progressive findings identified. 2. No findings of solid organ metastasis, skeletal metastasis or metastatic disease to the abdomen or pelvis. 3. FDG avid right lobe of thyroid gland nodule is again noted This nodule is not incidental and been worked up previously with 2 biopsies. Please refer to results from the most recent biopsy dated 07/18/2017. (Ref: J Am Coll Radiol. 2015 Feb;12(2): 143-50). 4.  Aortic Atherosclerosis (ICD10-I70.0).   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      REVIEW OF SYSTEMS:   Constitutional: Denies fevers, chills or abnormal weight loss Eyes: Denies blurriness of vision Ears, nose, mouth, throat, and face: Denies mucositis or sore throat Respiratory: Denies cough, dyspnea or wheezes Cardiovascular: Denies palpitation, chest discomfort or lower extremity swelling Gastrointestinal:  Denies nausea, heartburn or  change in bowel habits Skin: Denies abnormal skin rashes Lymphatics: Denies new lymphadenopathy or easy bruising Neurological:Denies numbness, tingling or new weaknesses Behavioral/Psych: Mood is stable, no new changes  All other systems were reviewed with the patient and are negative.  I have reviewed the past medical history, past surgical history, social history and family history with the patient and they are unchanged from previous note.  ALLERGIES:  has No Known Allergies.  MEDICATIONS:  Current Outpatient Medications  Medication Sig Dispense Refill  . amLODipine (NORVASC) 10 MG tablet TAKE 1 TABLET BY MOUTH DAILY. DECREASE SIMVASTATIN TO 20MG DAILY WHILE ON AMLODIPINE PER MD 90 tablet 1  . busPIRone (BUSPAR) 10 MG tablet Take 10 mg by mouth 2 (two) times daily.    . Calcium Carb-Cholecalciferol 500-600 MG-UNIT TABS Take 1 tablet by mouth daily.     . Cholecalciferol (VITAMIN D3) 2000 units TABS Take 2,000 Units by mouth daily.     . hydrochlorothiazide (HYDRODIURIL) 50 MG tablet TAKE 1 TABLET (50 MG TOTAL) BY MOUTH DAILY. 90 tablet 11  . LENVIMA, 10 MG DAILY DOSE, capsule TAKE 1 CAPSULE (10 MG TOTAL) BY MOUTH DAILY. 30 capsule 11  . levothyroxine (SYNTHROID) 75 MCG tablet Take 1 tablet (75 mcg total) by mouth daily before breakfast. 90 tablet 2  . LORazepam (ATIVAN) 0.5 MG tablet Take 0.5 mg by mouth 2 (two) times daily as needed.  0  . losartan (COZAAR) 50 MG tablet TAKE 1 TABLET (50 MG TOTAL) BY MOUTH DAILY. 30 tablet 11  . metoprolol tartrate (LOPRESSOR) 25 MG tablet Take 1 tablet (25 mg total) by mouth 2 (two) times daily. 180 tablet 3  . ondansetron (ZOFRAN) 8 MG tablet Take 1 tablet (8 mg total) by mouth every 8 (eight) hours as needed for nausea. 30 tablet 3  . pantoprazole (PROTONIX) 40 MG tablet Take 1 tablet (40 mg total) by mouth daily. 90 tablet 3  . simvastatin (ZOCOR) 40 MG tablet Take 40 mg by mouth every evening.    . tacrolimus (PROTOPIC) 0.1 % ointment Apply 1  application topically daily as needed (rash).     . venlafaxine XR (EFFEXOR-XR) 75 MG 24 hr capsule Take 300 mg by mouth daily with breakfast.      No current facility-administered medications for this visit.   Facility-Administered Medications Ordered in Other Visits  Medication Dose Route Frequency Provider Last Rate Last Admin  . heparin lock flush 100 unit/mL  500 Units Intracatheter Once PRN Alvy Bimler, Lorraina Spring, MD      . pembrolizumab (KEYTRUDA) 200 mg in sodium chloride 0.9 % 50 mL chemo infusion  200 mg Intravenous Once Schwanda Zima, MD      . sodium chloride flush (NS) 0.9 % injection 10 mL  10 mL Intracatheter PRN Alvy Bimler, Exie Chrismer, MD        PHYSICAL EXAMINATION: ECOG PERFORMANCE STATUS: 1 - Symptomatic but completely ambulatory  Vitals:   10/23/19 0934  BP: 112/66  Pulse: 76  Resp: 18  Temp: 98.5 F (36.9 C)  SpO2: 100%   Filed Weights   10/23/19 0934  Weight: 154 lb 12.8 oz (70.2 kg)    GENERAL:alert, no distress and comfortable NEURO: alert & oriented x 3 with fluent speech, no focal motor/sensory deficits  LABORATORY DATA:  I have reviewed the data as listed    Component Value Date/Time   NA 138 10/23/2019 0916   NA 141 05/17/2017 0957   K 3.7 10/23/2019 0916   K 3.6 05/17/2017 0957   CL 102 10/23/2019 0916   CL 105 11/19/2012 0848   CO2 27 10/23/2019 0916   CO2 25 05/17/2017 0957   GLUCOSE 101 (H) 10/23/2019 0916   GLUCOSE 85 05/17/2017 0957   GLUCOSE 122 (H) 11/19/2012 0848  BUN 26 (H) 10/23/2019 0916   BUN 15.2 05/17/2017 0957   CREATININE 1.12 (H) 10/23/2019 0916   CREATININE 0.83 07/10/2019 1240   CREATININE 0.8 05/17/2017 0957   CALCIUM 9.8 10/23/2019 0916   CALCIUM 9.6 05/17/2017 0957   PROT 7.5 10/23/2019 0916   PROT 7.0 05/17/2017 0957   ALBUMIN 3.9 10/23/2019 0916   ALBUMIN 3.9 05/17/2017 0957   AST 23 10/23/2019 0916   AST 23 07/10/2019 1240   AST 25 05/17/2017 0957   ALT 18 10/23/2019 0916   ALT 20 07/10/2019 1240   ALT 18 05/17/2017 0957    ALKPHOS 53 10/23/2019 0916   ALKPHOS 57 05/17/2017 0957   BILITOT 0.5 10/23/2019 0916   BILITOT 0.4 07/10/2019 1240   BILITOT 0.47 05/17/2017 0957   GFRNONAA 51 (L) 10/23/2019 0916   GFRNONAA >60 07/10/2019 1240   GFRAA 59 (L) 10/23/2019 0916   GFRAA >60 07/10/2019 1240    No results found for: SPEP, UPEP  Lab Results  Component Value Date   WBC 6.5 10/23/2019   NEUTROABS 5.1 10/23/2019   HGB 11.2 (L) 10/23/2019   HCT 34.4 (L) 10/23/2019   MCV 96.4 10/23/2019   PLT 192 10/23/2019      Chemistry      Component Value Date/Time   NA 138 10/23/2019 0916   NA 141 05/17/2017 0957   K 3.7 10/23/2019 0916   K 3.6 05/17/2017 0957   CL 102 10/23/2019 0916   CL 105 11/19/2012 0848   CO2 27 10/23/2019 0916   CO2 25 05/17/2017 0957   BUN 26 (H) 10/23/2019 0916   BUN 15.2 05/17/2017 0957   CREATININE 1.12 (H) 10/23/2019 0916   CREATININE 0.83 07/10/2019 1240   CREATININE 0.8 05/17/2017 0957   GLU 158 (H) 01/14/2015 1035      Component Value Date/Time   CALCIUM 9.8 10/23/2019 0916   CALCIUM 9.6 05/17/2017 0957   ALKPHOS 53 10/23/2019 0916   ALKPHOS 57 05/17/2017 0957   AST 23 10/23/2019 0916   AST 23 07/10/2019 1240   AST 25 05/17/2017 0957   ALT 18 10/23/2019 0916   ALT 20 07/10/2019 1240   ALT 18 05/17/2017 0957   BILITOT 0.5 10/23/2019 0916   BILITOT 0.4 07/10/2019 1240   BILITOT 0.47 05/17/2017 0957       RADIOGRAPHIC STUDIES: I have reviewed multiple imaging studies with the patient I have personally reviewed the radiological images as listed and agreed with the findings in the report. NM PET Image Restag (PS) Skull Base To Thigh  Result Date: 10/22/2019 CLINICAL DATA:  Subsequent treatment strategy for uterine/cervical cancer. EXAM: NUCLEAR MEDICINE PET SKULL BASE TO THIGH TECHNIQUE: 7.91 mCi F-18 FDG was injected intravenously. Full-ring PET imaging was performed from the skull base to thigh after the radiotracer. CT data was obtained and used for attenuation  correction and anatomic localization. Fasting blood glucose: 86 mg/dl COMPARISON:  07/30/2019 FINDINGS: Mediastinal blood pool activity: SUV max 2.58 Liver activity: SUV max NA NECK: No hypermetabolic lymph nodes in the neck. Incidental CT findings: Again seen is a an FDG avid right lobe of thyroid gland nodule measuring 2 cm within SUV max of 18.24. CHEST: There are 2 right supraclavicular lymph nodes which are FDG at measuring up to 5 mm within SUV max of 5.87. Previously this measured the same within SUV max of 4.4. Index juxta esophageal lymph node is FDG avid within SUV max of 7.18. Previously 9.55. No FDG avid pulmonary nodules identified. Incidental  CT findings: Aortic atherosclerosis. ABDOMEN/PELVIS: No abnormal tracer uptake identified within the liver, pancreas, spleen or adrenal glands. No FDG avid lymph nodes within the abdomen or pelvis. Incidental CT findings: Aortic atherosclerosis. SKELETON: No focal hypermetabolic activity to suggest skeletal metastasis. Incidental CT findings: none IMPRESSION: 1. Persistent FDG avid subcentimeter right supraclavicular and juxta esophageal lymph nodes. The degree of FDG uptake is similar to the previous exam. No new or progressive findings identified. 2. No findings of solid organ metastasis, skeletal metastasis or metastatic disease to the abdomen or pelvis. 3. FDG avid right lobe of thyroid gland nodule is again noted This nodule is not incidental and been worked up previously with 2 biopsies. Please refer to results from the most recent biopsy dated 07/18/2017. (Ref: J Am Coll Radiol. 2015 Feb;12(2): 143-50). 4.  Aortic Atherosclerosis (ICD10-I70.0). Electronically Signed   By: Kerby Moors M.D.   On: 10/22/2019 12:47

## 2019-10-23 NOTE — Telephone Encounter (Signed)
Scheduled appt per 5/20 sch message -pt to get an updated schedule in treatment area.

## 2019-10-23 NOTE — Assessment & Plan Note (Signed)
I have reviewed PET imaging study from Oct 22, 2019 with the patient which show stable disease She tolerated treatment well without major side effects We will continue treatment indefinitely I will see her every other visit She will continue Lenvima at home and monitor her blood pressure carefully I recommend lengthening the interval between imaging studies to another, and plan to repeat imaging study in 4 months, due around September

## 2019-10-23 NOTE — Assessment & Plan Note (Signed)
She has intermittent elevated TSH I will adjust her thyroid medicine accordingly 

## 2019-10-23 NOTE — Assessment & Plan Note (Signed)
She has intermittent proteinuria even though her blood pressure remains well controlled She will continue aggressive blood pressure management 

## 2019-10-23 NOTE — Assessment & Plan Note (Signed)
PET CT scan show mild esophagitis Symptoms wise, she feels better with pantoprazole We will continue the same

## 2019-10-24 ENCOUNTER — Other Ambulatory Visit: Payer: Self-pay | Admitting: Hematology and Oncology

## 2019-10-27 MED FILL — PANTOPRAZOLE SOD DR 40 MG T: 40 | 90 days supply | Qty: 90 | Fill #1

## 2019-11-04 MED FILL — LEVOTHYROXINE 88 MCG TABLET: 88 | 90 days supply | Qty: 90 | Fill #1

## 2019-11-13 ENCOUNTER — Inpatient Hospital Stay: Payer: 59

## 2019-11-13 ENCOUNTER — Inpatient Hospital Stay: Payer: 59 | Attending: Hematology and Oncology

## 2019-11-13 ENCOUNTER — Other Ambulatory Visit: Payer: Self-pay

## 2019-11-13 VITALS — BP 129/73 | HR 67 | Temp 97.9°F | Resp 18 | Wt 156.5 lb

## 2019-11-13 DIAGNOSIS — Z5112 Encounter for antineoplastic immunotherapy: Secondary | ICD-10-CM | POA: Insufficient documentation

## 2019-11-13 DIAGNOSIS — C541 Malignant neoplasm of endometrium: Secondary | ICD-10-CM | POA: Diagnosis not present

## 2019-11-13 DIAGNOSIS — C770 Secondary and unspecified malignant neoplasm of lymph nodes of head, face and neck: Secondary | ICD-10-CM | POA: Diagnosis not present

## 2019-11-13 DIAGNOSIS — Z7189 Other specified counseling: Secondary | ICD-10-CM

## 2019-11-13 LAB — CBC WITH DIFFERENTIAL/PLATELET
Abs Immature Granulocytes: 0.01 10*3/uL (ref 0.00–0.07)
Basophils Absolute: 0.1 10*3/uL (ref 0.0–0.1)
Basophils Relative: 1 %
Eosinophils Absolute: 0.3 10*3/uL (ref 0.0–0.5)
Eosinophils Relative: 6 %
HCT: 32 % — ABNORMAL LOW (ref 36.0–46.0)
Hemoglobin: 10.7 g/dL — ABNORMAL LOW (ref 12.0–15.0)
Immature Granulocytes: 0 %
Lymphocytes Relative: 20 %
Lymphs Abs: 1 10*3/uL (ref 0.7–4.0)
MCH: 31.8 pg (ref 26.0–34.0)
MCHC: 33.4 g/dL (ref 30.0–36.0)
MCV: 95 fL (ref 80.0–100.0)
Monocytes Absolute: 0.3 10*3/uL (ref 0.1–1.0)
Monocytes Relative: 7 %
Neutro Abs: 3.1 10*3/uL (ref 1.7–7.7)
Neutrophils Relative %: 66 %
Platelets: 194 10*3/uL (ref 150–400)
RBC: 3.37 MIL/uL — ABNORMAL LOW (ref 3.87–5.11)
RDW: 13.3 % (ref 11.5–15.5)
WBC: 4.8 10*3/uL (ref 4.0–10.5)
nRBC: 0 % (ref 0.0–0.2)

## 2019-11-13 LAB — COMPREHENSIVE METABOLIC PANEL
ALT: 23 U/L (ref 0–44)
AST: 23 U/L (ref 15–41)
Albumin: 4 g/dL (ref 3.5–5.0)
Alkaline Phosphatase: 54 U/L (ref 38–126)
Anion gap: 11 (ref 5–15)
BUN: 27 mg/dL — ABNORMAL HIGH (ref 8–23)
CO2: 26 mmol/L (ref 22–32)
Calcium: 9.9 mg/dL (ref 8.9–10.3)
Chloride: 99 mmol/L (ref 98–111)
Creatinine, Ser: 1 mg/dL (ref 0.44–1.00)
GFR calc Af Amer: >60 mL/min (ref >60)
GFR calc non Af Amer: 59 mL/min — ABNORMAL LOW (ref >60)
Glucose, Bld: 100 mg/dL — ABNORMAL HIGH (ref 70–99)
Potassium: 3.8 mmol/L (ref 3.5–5.1)
Sodium: 136 mmol/L (ref 135–145)
Total Bilirubin: 0.5 mg/dL (ref 0.3–1.2)
Total Protein: 7.5 g/dL (ref 6.5–8.1)

## 2019-11-13 LAB — TSH: TSH: 4.271 u[IU]/mL — ABNORMAL HIGH (ref 0.308–3.960)

## 2019-11-13 MED ORDER — HEPARIN SOD (PORK) LOCK FLUSH 100 UNIT/ML IV SOLN
500.0000 [IU] | Freq: Once | INTRAVENOUS | Status: DC | PRN
  Filled 2019-11-13: qty 5

## 2019-11-13 MED ORDER — SODIUM CHLORIDE 0.9% FLUSH
10.0000 mL | INTRAVENOUS | Status: DC | PRN
  Filled 2019-11-13: qty 10

## 2019-11-13 MED ORDER — SODIUM CHLORIDE 0.9 % IV SOLN
Freq: Once | INTRAVENOUS | Status: AC
  Filled 2019-11-13: qty 250

## 2019-11-13 MED ORDER — SODIUM CHLORIDE 0.9 % IV SOLN
200.0000 mg | Freq: Once | INTRAVENOUS | Status: AC
  Administered 2019-11-13: 200 mg via INTRAVENOUS
  Filled 2019-11-13: qty 8

## 2019-11-13 NOTE — Patient Instructions (Signed)
[facility] Discharge Instructions for Patients Receiving Chemotherapy  Today you received the following Immunotherapy agent: Pembrolizumab (Keytruda)  To help prevent nausea and vomiting after your treatment, we encourage you to take your nausea medication as directed by your MD.   If you develop nausea and vomiting that is not controlled by your nausea medication, call the clinic.   BELOW ARE SYMPTOMS THAT SHOULD BE REPORTED IMMEDIATELY:  *FEVER GREATER THAN 100.5 F  *CHILLS WITH OR WITHOUT FEVER  NAUSEA AND VOMITING THAT IS NOT CONTROLLED WITH YOUR NAUSEA MEDICATION  *UNUSUAL SHORTNESS OF BREATH  *UNUSUAL BRUISING OR BLEEDING  TENDERNESS IN MOUTH AND THROAT WITH OR WITHOUT PRESENCE OF ULCERS  *URINARY PROBLEMS  *BOWEL PROBLEMS  UNUSUAL RASH Items with * indicate a potential emergency and should be followed up as soon as possible.  Feel free to call the clinic should you have any questions or concerns. The clinic phone number is [phone].  Please show the CHEMO ALERT CARD at check-in to the Emergency Department and triage nurse.   

## 2019-11-17 ENCOUNTER — Ambulatory Visit (INDEPENDENT_AMBULATORY_CARE_PROVIDER_SITE_OTHER): Payer: 59 | Admitting: Podiatry

## 2019-11-17 ENCOUNTER — Encounter: Payer: Self-pay | Admitting: Podiatry

## 2019-11-17 ENCOUNTER — Other Ambulatory Visit: Payer: Self-pay

## 2019-11-17 DIAGNOSIS — M79672 Pain in left foot: Secondary | ICD-10-CM | POA: Diagnosis not present

## 2019-11-17 DIAGNOSIS — M79671 Pain in right foot: Secondary | ICD-10-CM | POA: Diagnosis not present

## 2019-11-17 DIAGNOSIS — L989 Disorder of the skin and subcutaneous tissue, unspecified: Secondary | ICD-10-CM

## 2019-11-17 MED ORDER — UREA 40 % EX CREA: 1.0000 "application " | TOPICAL_CREAM | Freq: Every day | CUTANEOUS | 1 refills | Status: DC

## 2019-11-17 MED FILL — UREA 40 % CREA: 40 | 30 days supply | Qty: 85 | Fill #0

## 2019-11-24 ENCOUNTER — Telehealth: Payer: Self-pay

## 2019-11-24 MED FILL — LOSARTAN POTASSIUM 50 MG TA: 50 | 30 days supply | Qty: 30 | Fill #2

## 2019-11-24 NOTE — Progress Notes (Signed)
Subjective:   Patient ID: Diane Lozano, female   DOB: 64 y.o.   MRN: [id number]   HPI 64 year old female presents the office today for concerns of hardened skin to the feet bilaterally.  She tried scraping them but makes it feel worse.  She states this is a side effect of taking Lenvima.  Areas are painful but denies any drainage or pus or any swelling or redness.  No other concerns today.   Review of Systems  All other systems reviewed and are negative.  Past Medical History:  Diagnosis Date  . Anemia   . Anxiety    hx of  . Depression    hx of  . 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 History of radiation therapy 04/13/16-06/01/16   chest 56 Gy in 28 fractions  . Hx of radiation therapy 5/27, 6/5, 6/11, 6/19, 11/27/2012   proximal vagina 30 Gy in 5 fx, HDR  . Hyperlipidemia   . Hypertension    no current bp meds due to weight loss   . Lung mass   . PONV (postoperative nausea and vomiting)   . Radiation 04/05/15 - 05/13/15   retroperitoneal nodes 50.4 gray boosted to 56 gray  . Thyroid nodule    sees dr Forde Dandy q 6 months for    Past Surgical History:  Procedure Laterality Date  . ABDOMINAL HYSTERECTOMY     robotic hysterectomy with BSO and bilateral pelvic and periaortic node dissection  . CESAREAN SECTION    . CESAREAN SECTION    . COLONOSCOPY  2008  . ENDOBRONCHIAL ULTRASOUND Bilateral 04/02/2017   Procedure: ENDOBRONCHIAL ULTRASOUND;  Surgeon: Marshell Garfinkel, MD;  Location: [facility];  Service: Cardiopulmonary;  Laterality: Bilateral;  . IR FLUORO GUIDE PORT INSERTION RIGHT  04/11/2017  . IR US GUIDE VASC ACCESS RIGHT  04/11/2017     Current Outpatient Medications:  .  amLODipine (NORVASC) 10 MG tablet, TAKE 1 TABLET BY MOUTH DAILY. DECREASE SIMVASTATIN TO 20MG  DAILY WHILE ON AMLODIPINE PER MD, Disp: 90 tablet, Rfl: 1 .  busPIRone (BUSPAR) 10 MG tablet, Take 10 mg by mouth 2 (two) times daily., Disp: , Rfl:  .  Calcium  Carb-Cholecalciferol 500-600 MG-UNIT TABS, Take 1 tablet by mouth daily. , Disp: , Rfl:  .  Cholecalciferol (VITAMIN D3) 2000 units TABS, Take 2,000 Units by mouth daily. , Disp: , Rfl:  .  hydrochlorothiazide (HYDRODIURIL) 50 MG tablet, TAKE 1 TABLET (50 MG TOTAL) BY MOUTH DAILY., Disp: 90 tablet, Rfl: 11 .  LENVIMA, 10 MG DAILY DOSE, capsule, TAKE 1 CAPSULE (10 MG TOTAL) BY MOUTH DAILY., Disp: 30 capsule, Rfl: 11 .  levothyroxine (SYNTHROID) 75 MCG tablet, Take 1 tablet (75 mcg total) by mouth daily before breakfast., Disp: 90 tablet, Rfl: 2 .  LORazepam (ATIVAN) 0.5 MG tablet, Take 0.5 mg by mouth 2 (two) times daily as needed., Disp: , Rfl: 0 .  losartan (COZAAR) 50 MG tablet, TAKE 1 TABLET (50 MG TOTAL) BY MOUTH DAILY., Disp: 30 tablet, Rfl: 11 .  metoprolol tartrate (LOPRESSOR) 25 MG tablet, Take 1 tablet (25 mg total) by mouth 2 (two) times daily., Disp: 180 tablet, Rfl: 3 .  ondansetron (ZOFRAN) 8 MG tablet, Take 1 tablet (8 mg total) by mouth every 8 (eight) hours as needed for nausea., Disp: 30 tablet, Rfl: 3 .  pantoprazole (PROTONIX) 40 MG tablet, Take 1 tablet (40 mg total) by mouth daily., Disp: 90 tablet, Rfl: 3 .  simvastatin (ZOCOR) 40 MG tablet, Take 40 mg by mouth every evening., Disp: , Rfl:  .  tacrolimus (PROTOPIC) 0.1 % ointment, Apply 1 application topically daily as needed (rash). , Disp: , Rfl:  .  urea (CARMOL) 40 % CREA, Apply 1 application topically daily., Disp: 85 g, Rfl: 1 .  venlafaxine XR (EFFEXOR-XR) 75 MG 24 hr capsule, Take 300 mg by mouth daily with breakfast. , Disp: , Rfl:   No Known Allergies      Objective:  Physical Exam  General: AAO x3, NAD  Dermatological: Thick hyperkeratotic lesion submetatarsal area bilaterally without any underlying ulceration drainage or any signs of infection today.  No erythema.  No open lesions identified today..  Vascular: Dorsalis Pedis artery and Posterior Tibial artery pedal pulses are 2/4 bilateral with immedate  capillary fill time.  There is no pain with calf compression, swelling, warmth, erythema.   Neruologic: Grossly intact via light touch bilateral.   Musculoskeletal: No gross boney pedal deformities bilateral. No pain, crepitus, or limitation noted with foot and ankle range of motion bilateral. Muscular strength 5/5 in all groups tested bilateral.  Gait: Unassisted, Nonantalgic.       Assessment:   Hyperkeratotic lesions    Plan:  -Treatment options discussed including all alternatives, risks, and complications -Etiology of symptoms were discussed -I debrided the hyperkeratotic lesions without any complications or bleeding.  Recommended urea cream daily.  Offloading.  Return if symptoms worsen or fail to improve.  Trula Slade DPM

## 2019-11-24 NOTE — Telephone Encounter (Signed)
Oral Oncology Patient Advocate Encounter  Received notification from [facility] that prior authorization for Lenvima is required.  PA submitted on CoverMyMeds Key [id number] Status is pending  Oral Oncology Clinic will continue to follow.  [place] Patient [place] Phone [phone] Fax [phone] 11/24/2019 10:02 AM

## 2019-11-25 MED FILL — LENVIMA 10 MG DAILY DOSE: 10 | 30 days supply | Qty: 30 | Fill #2

## 2019-11-25 NOTE — Telephone Encounter (Signed)
Oral Oncology Patient Advocate Encounter  Prior Authorization for [employer] has been approved.    PA# [id number] Effective dates: 11/24/19 through 11/22/20   Oral Oncology Clinic will continue to follow.   [place] Patient [place] Phone [phone] Fax [phone] 11/25/2019 9:12 AM

## 2019-12-01 MED FILL — busPIRone HCL 10 MG TABS: 10 | 90 days supply | Qty: 180 | Fill #1

## 2019-12-02 MED FILL — SIMVASTATIN 20 MG TABLET: 20 | 90 days supply | Qty: 90 | Fill #1

## 2019-12-02 MED FILL — HYDROCHLOROTHIAZIDE 50 MG T: 50 | 90 days supply | Qty: 90 | Fill #3

## 2019-12-04 ENCOUNTER — Inpatient Hospital Stay (HOSPITAL_BASED_OUTPATIENT_CLINIC_OR_DEPARTMENT_OTHER): Payer: 59 | Admitting: Hematology and Oncology

## 2019-12-04 ENCOUNTER — Other Ambulatory Visit: Payer: Self-pay

## 2019-12-04 ENCOUNTER — Encounter: Payer: Self-pay | Admitting: Hematology and Oncology

## 2019-12-04 ENCOUNTER — Inpatient Hospital Stay: Payer: 59

## 2019-12-04 ENCOUNTER — Inpatient Hospital Stay: Payer: 59 | Attending: Hematology and Oncology

## 2019-12-04 ENCOUNTER — Other Ambulatory Visit: Payer: Self-pay | Admitting: Hematology and Oncology

## 2019-12-04 ENCOUNTER — Telehealth: Payer: Self-pay | Admitting: Hematology and Oncology

## 2019-12-04 DIAGNOSIS — C541 Malignant neoplasm of endometrium: Secondary | ICD-10-CM

## 2019-12-04 DIAGNOSIS — I10 Essential (primary) hypertension: Secondary | ICD-10-CM | POA: Diagnosis not present

## 2019-12-04 DIAGNOSIS — Z923 Personal history of irradiation: Secondary | ICD-10-CM | POA: Insufficient documentation

## 2019-12-04 DIAGNOSIS — T451X5A Adverse effect of antineoplastic and immunosuppressive drugs, initial encounter: Secondary | ICD-10-CM | POA: Insufficient documentation

## 2019-12-04 DIAGNOSIS — Z95828 Presence of other vascular implants and grafts: Secondary | ICD-10-CM

## 2019-12-04 DIAGNOSIS — E039 Hypothyroidism, unspecified: Secondary | ICD-10-CM | POA: Insufficient documentation

## 2019-12-04 DIAGNOSIS — Z5112 Encounter for antineoplastic immunotherapy: Secondary | ICD-10-CM | POA: Insufficient documentation

## 2019-12-04 DIAGNOSIS — D6481 Anemia due to antineoplastic chemotherapy: Secondary | ICD-10-CM

## 2019-12-04 DIAGNOSIS — R809 Proteinuria, unspecified: Secondary | ICD-10-CM | POA: Diagnosis not present

## 2019-12-04 DIAGNOSIS — Z9221 Personal history of antineoplastic chemotherapy: Secondary | ICD-10-CM | POA: Insufficient documentation

## 2019-12-04 DIAGNOSIS — C770 Secondary and unspecified malignant neoplasm of lymph nodes of head, face and neck: Secondary | ICD-10-CM

## 2019-12-04 DIAGNOSIS — Z7189 Other specified counseling: Secondary | ICD-10-CM

## 2019-12-04 DIAGNOSIS — Z79899 Other long term (current) drug therapy: Secondary | ICD-10-CM | POA: Insufficient documentation

## 2019-12-04 LAB — COMPREHENSIVE METABOLIC PANEL
ALT: 21 U/L (ref 0–44)
AST: 23 U/L (ref 15–41)
Albumin: 3.8 g/dL (ref 3.5–5.0)
Alkaline Phosphatase: 43 U/L (ref 38–126)
Anion gap: 9 (ref 5–15)
BUN: 25 mg/dL — ABNORMAL HIGH (ref 8–23)
CO2: 28 mmol/L (ref 22–32)
Calcium: 9.7 mg/dL (ref 8.9–10.3)
Chloride: 102 mmol/L (ref 98–111)
Creatinine, Ser: 0.89 mg/dL (ref 0.44–1.00)
GFR calc Af Amer: >60 mL/min (ref >60)
GFR calc non Af Amer: >60 mL/min (ref >60)
Glucose, Bld: 77 mg/dL (ref 70–99)
Potassium: 4.1 mmol/L (ref 3.5–5.1)
Sodium: 139 mmol/L (ref 135–145)
Total Bilirubin: 0.6 mg/dL (ref 0.3–1.2)
Total Protein: 7.2 g/dL (ref 6.5–8.1)

## 2019-12-04 LAB — CBC WITH DIFFERENTIAL/PLATELET
Abs Immature Granulocytes: 0.01 10*3/uL (ref 0.00–0.07)
Basophils Absolute: 0.1 10*3/uL (ref 0.0–0.1)
Basophils Relative: 1 %
Eosinophils Absolute: 0.3 10*3/uL (ref 0.0–0.5)
Eosinophils Relative: 7 %
HCT: 31.1 % — ABNORMAL LOW (ref 36.0–46.0)
Hemoglobin: 10 g/dL — ABNORMAL LOW (ref 12.0–15.0)
Immature Granulocytes: 0 %
Lymphocytes Relative: 20 %
Lymphs Abs: 0.8 10*3/uL (ref 0.7–4.0)
MCH: 31.4 pg (ref 26.0–34.0)
MCHC: 32.2 g/dL (ref 30.0–36.0)
MCV: 97.8 fL (ref 80.0–100.0)
Monocytes Absolute: 0.4 10*3/uL (ref 0.1–1.0)
Monocytes Relative: 9 %
Neutro Abs: 2.5 10*3/uL (ref 1.7–7.7)
Neutrophils Relative %: 63 %
Platelets: 169 10*3/uL (ref 150–400)
RBC: 3.18 MIL/uL — ABNORMAL LOW (ref 3.87–5.11)
RDW: 13.2 % (ref 11.5–15.5)
WBC: 4 10*3/uL (ref 4.0–10.5)
nRBC: 0 % (ref 0.0–0.2)

## 2019-12-04 LAB — TOTAL PROTEIN, URINE DIPSTICK: Protein, ur: NEGATIVE mg/dL

## 2019-12-04 MED ORDER — SODIUM CHLORIDE 0.9% FLUSH
10.0000 mL | Freq: Once | INTRAVENOUS | Status: AC
  Administered 2019-12-04: 10 mL
  Filled 2019-12-04: qty 10

## 2019-12-04 MED ORDER — SODIUM CHLORIDE 0.9% FLUSH
10.0000 mL | INTRAVENOUS | Status: DC | PRN
  Administered 2019-12-04: 10 mL
  Filled 2019-12-04: qty 10

## 2019-12-04 MED ORDER — HEPARIN SOD (PORK) LOCK FLUSH 100 UNIT/ML IV SOLN
500.0000 [IU] | Freq: Once | INTRAVENOUS | Status: AC | PRN
  Administered 2019-12-04: 500 [IU]
  Filled 2019-12-04: qty 5

## 2019-12-04 MED ORDER — SODIUM CHLORIDE 0.9 % IV SOLN
200.0000 mg | Freq: Once | INTRAVENOUS | Status: AC
  Administered 2019-12-04: 200 mg via INTRAVENOUS
  Filled 2019-12-04: qty 8

## 2019-12-04 MED ORDER — SODIUM CHLORIDE 0.9 % IV SOLN
Freq: Once | INTRAVENOUS | Status: AC
  Filled 2019-12-04: qty 250

## 2019-12-04 NOTE — Patient Instructions (Signed)
[facility] Discharge Instructions for Patients Receiving Chemotherapy  Today you received the following Immunotherapy agent: Pembrolizumab (Keytruda)  To help prevent nausea and vomiting after your treatment, we encourage you to take your nausea medication as directed by your MD.   If you develop nausea and vomiting that is not controlled by your nausea medication, call the clinic.   BELOW ARE SYMPTOMS THAT SHOULD BE REPORTED IMMEDIATELY:  *FEVER GREATER THAN 100.5 F  *CHILLS WITH OR WITHOUT FEVER  NAUSEA AND VOMITING THAT IS NOT CONTROLLED WITH YOUR NAUSEA MEDICATION  *UNUSUAL SHORTNESS OF BREATH  *UNUSUAL BRUISING OR BLEEDING  TENDERNESS IN MOUTH AND THROAT WITH OR WITHOUT PRESENCE OF ULCERS  *URINARY PROBLEMS  *BOWEL PROBLEMS  UNUSUAL RASH Items with * indicate a potential emergency and should be followed up as soon as possible.  Feel free to call the clinic should you have any questions or concerns. The clinic phone number is [phone].  Please show the CHEMO ALERT CARD at check-in to the Emergency Department and triage nurse.   

## 2019-12-04 NOTE — Assessment & Plan Note (Signed)

## 2019-12-04 NOTE — Assessment & Plan Note (Signed)
She has intermittent elevated TSH I will adjust her thyroid medicine accordingly 

## 2019-12-04 NOTE — Assessment & Plan Note (Signed)
I have reviewed PET imaging study from Oct 22, 2019 with the patient which show stable disease She tolerated treatment well without major side effects We will continue treatment indefinitely I will see her every other visit She will continue Lenvima at home and monitor her blood pressure carefully I recommend lengthening the interval between imaging studies to another, and plan to repeat imaging study in 4-6 months, after the fall

## 2019-12-04 NOTE — Assessment & Plan Note (Signed)
She has intermittent proteinuria even though her blood pressure remains well controlled She will continue aggressive blood pressure management

## 2019-12-04 NOTE — Patient Instructions (Signed)

## 2019-12-04 NOTE — Progress Notes (Signed)
[place] OFFICE PROGRESS NOTE  Patient Care Team: [name], MD as PCP - General (Family Medicine) Reynold Bowen, MD as Consulting Physician (Endocrinology)  ASSESSMENT & PLAN:  Endometrial cancer [facility]) I have reviewed PET imaging study from Oct 22, 2019 with the patient which show stable disease She tolerated treatment well without major side effects We will continue treatment indefinitely I will see her every other visit She will continue Lenvima at home and monitor her blood pressure carefully I recommend lengthening the interval between imaging studies to another, and plan to repeat imaging study in 4-6 months, after the fall  Acquired hypothyroidism She has intermittent elevated TSH I will adjust her thyroid medicine accordingly  Essential hypertension She has intermittent proteinuria even though her blood pressure remains well controlled She will continue aggressive blood pressure management  Anemia due to antineoplastic chemotherapy This is likely due to recent treatment. The patient denies recent history of bleeding such as epistaxis, hematuria or hematochezia. She is asymptomatic from the anemia. I will observe for now.  She does not require transfusion now. I will continue the chemotherapy at current dose without dosage adjustment.  If the anemia gets progressive worse in the future, I might have to delay her treatment or adjust the chemotherapy dose.    No orders of the defined types were placed in this encounter.   All questions were answered. The patient knows to call the clinic with any problems, questions or concerns. The total time spent in the appointment was 20 minutes encounter with patients including review of chart and various tests results, discussions about plan of care and coordination of care plan   [name], MD 12/04/2019 9:36 AM  INTERVAL HISTORY: Please see below for problem oriented charting. She returns for chemotherapy and  follow-up She saw a podiatrist recently because of foot pain It is bothering her intermittently Her blood pressure control at home is satisfactory Denies infusion reaction No recent infection, fever or chills  SUMMARY OF ONCOLOGIC HISTORY: Oncology History Overview Note  [employer] testing done 11-2015 on surgical path from 2014: MS stable TMB low 4 muts/mb ATM [id number] ERBB3 T389K E2H2 rearrangement exon 9 PPP2R1A P179R TP53 I195N    ER- APPROXIMATELY 25-35% STAINING IN NEOPLASTIC CELLS (INTERMEDIATE)  PR- APPROXIMATELY 25-35% STAINING IN NEOPLASTIC CELLS (STRONG)  Repeat biopsy 04/02/17: ER negative, Her 2 negative  Progressed on Doxil   Endometrial cancer ([place])  06/20/2012 Pathology Results   Biopsy positive for papillary serous carcinoma   06/20/2012 Genetic Testing   [employer] One testing done 11-2015 on surgical path from 2014: MS stable TMB low 4 muts/mb ATM [id number] ERBB3 T389K E2H2 rearrangement exon 9 PPP2R1A P179R TP53 I195N    06/20/2012 Initial Diagnosis   Patient presented to PCP with intermittent vaginal bleeding since ~ Oct 2013, endometrial biopsy 06-20-12 with complex endometrial hyperplasia with atypia   06/26/2012 Imaging   Thickened endometrial lining in a postmenopausal patient experiencing vaginal bleeding. In the setting of post-menopausal bleeding, endometrial sampling is indicated to exclude carcinoma. No focal myometrial abnormalities are seen.  Normal left ovary and non-visualized right ovary   07/30/2012 Surgery   Dr. Polly Cobia performed robotic hysterectomy with bilateral salpingo-oophorectomy, bilateral pelvic lymph node dissection and periaortic lymph node dissection. Intraoperatively on frozen section, the patient was noted to have a large endometrial polyp with changes within the polyp consistent for high-grade malignancy, possibly papillary serous carcinoma. There is no obvious extrauterine disease  noted.     07/30/2012 Pathology  Results   [phone] SUPPLEMENTAL REPORT  THE ENDOMETRIAL CARCINOMA WAS ANALYZED FOR DNA MISMATCH REPAIR PROTEINS.IMMUNOHISTOCHEMICALLY, THE NEOPLASM RETAINED NUCLEAR EXPRESSION OF 4 GENE PRODUCTS, MLH1, MSH2, MSH6, AND PMS2, INVOLVED IN DNA MISMATCH REPAIR.POSITIVE AND NEGATIVE CONTROLS WORKED APPROPRIATELY.  PER REQUEST, AN ER AND PR ARE PERFORMED ON BLOCK 1I.  ER- APPROXIMATELY 25-35% STAINING IN NEOPLASTIC CELLS (INTERMEDIATE)  PR- APPROXIMATELY 25-35% STAINING IN NEOPLASTIC CELLS (STRONG)  ER AND PR PREDOMINANTLY SHOW STAINING IN THE SEROUS COMPONENT.  DIAGNOSIS  1. UTERUS, CERVIX WITH BILATERAL FALLOPIAN TUBES AND OVARIES,  HYSTERECTOMY AND BILATERAL SALPINGO-OOPHORECTOMY:  HIGH GRADE/POORLY DIFFERENTIATED ENDOMETRIAL ADENOCARCINOMA WITH SOLID AND SEROUS PAPILLARY COMPONENTS.  HISTOPATHOLOGIC TYPE:A VARIETY OF PATTERNS WERE PRESENT, ALL OF WHICH SHOULD BE CONSIDERED TO BE HIGH GRADE.SEROUS PAPILLARY CARCINOMA WAS SEEN OCCURRING ADJACENT TO SOLID ENDOMETRIAL CARCINOMA WITH MARKED ANAPLASIA AND GIANT CELLS. SIZE:TUMOR MEASURED AT LEAST 4.8 CM IN GREATEST HORIZONTAL DIMENSION.  GRADE: POORLY DIFFERENTIATED OR HIGH GRADE. DEPTH OF INVASION: NO DEFINITE MYOMETRIAL INVOLVEMENT WAS SEEN.THE TUMOR APPEARED CONFINED TO THE POLYP AS WELL AS SURFACE ENDOMETRIUM. WHERE MYOMETRIAL THICKNESS NOT APPLICABLE. SEROSAL INVOLVEMENT: NOT DEMONSTRATED.  ENDOCERVICAL INVOLVEMENT:NOT DEMONSTRATED. RESECTION MARGINS: FREE OF INVOLVEMENT. EXTRAUTERINE EXTENSION:NOT DEMONSTRATED. ANGIOLYMPHATIC INVASION: NOT DEFINITELY SEEN. TOTAL NODES EXAMINED:22.  PELVIC NODES EXAMINED:20.  PELVIC NODES INVOLVED:0.  PARA-AORTIC NODES 2. EXAMINED:  PARA-AORTIC NODES 0. INVOLVED TNM STAGE: T1A N0 MX. AJCC STAGE GROUPING: IA. FIGO STAGE:IA.   08/26/2012 Imaging   No CT evidence for  intra-abdominal or pelvic metastatic disease. Trace free pelvic fluid, presumably postoperative although the date of surgery is not documented in the electronic medical record.   08/26/2012 - 12/13/2012 Chemotherapy   She received 6 cycles of carbo/taxol    10/29/2012 - 11/27/2012 Radiation Therapy   May 27, June 5,  June 11, June 19, November 27, 2012: Proximal vagina 30 Gy in 5 fractions     01/17/2013 Imaging   1.  No evidence of recurrent or metastatic disease. 2.  No acute abnormality involving the abdomen or pelvis. 3.  Mild diffuse hepatic steatosis. 4.  Very small supraumbilical midline anterior abdominal wall hernia containing fat, unchanged   01/22/2014 Imaging   No evidence for metastatic or recurrent disease. 2. No bowel obstruction.  Normal appendix. 3. Small fat containing hernia, stable in appearance. 4. Status post hysterectomy and bilateral oophorectomy   02/19/2014 Imaging   No pulmonary lesions are identified. The abnormality on the chest x-ray is due to asymmetric left-sided sternoclavicular joint degenerative disease   10/12/2014 Imaging   New mild retroperitoneal lymphadenopathy in the left paraaortic region and proximal left common iliac chain, consistent with metastatic disease. No other sites of metastatic disease identified within the abdomen or pelvis.     11/27/2014 - 02/11/2015 Chemotherapy   She received 4 cycles of carbo/taxol    03/01/2015 PET scan   Single hypermetabolic small retroperitoneal lymph node along the aorta. 2. No evidence of metastatic disease otherwise in the abdomen or pelvis. No evidence local recurrence. 3. Intensely hypermetabolic enlarged nodule adjacent to the RIGHT lobe of thyroid gland. This presumably represents the biopsied lesion in clinician report which was found to be benign thyroid tissue.   04/05/2015 - 05/13/2015 Radiation Therapy   She received 50.4 gray in 28 fractions with simultaneous integrated boost to 56 gray   06/17/2015  Imaging   No acute process or evidence of metastatic disease in the abdomen or pelvis. Resolution of previously described retroperitoneal adenopathy. 2.  Possible constipation. 3. Atherosclerosis.   06/17/2015 Tumor  Marker   Patient's tumor was tested for the following markers: CA125 Results of the tumor marker test revealed 33   08/12/2015 Tumor Marker   Patient's tumor was tested for the following markers: CA125 Results of the tumor marker test revealed 52   08/31/2015 PET scan   Development of right paratracheal hypermetabolic adenopathy, consistent with nodal metastasis. 2. The previously described isolated abdominal retroperitoneal hypermetabolic node has resolved. 3. Persistent hypermetabolic right thyroid nodule, per report previously biopsied. Correlate with those results. 4.  Possible constipation.   09/09/2015 -  Anti-estrogen oral therapy   She has been receiving alternative treatment between megace and tamoxifen   09/09/2015 Tumor Marker   Patient's tumor was tested for the following markers: CA125 Results of the tumor marker test revealed 37.8   09/20/2015 Procedure   Technically successful ultrasound-guided thyroid aspiration biopsy , dominant right nodule   09/20/2015 Pathology Results   THYROID, RIGHT, FINE NEEDLE ASPIRATION (SPECIMEN 1 OF 1 COLLECTED 09/20/15): FINDINGS CONSISTENT WITH BENIGN THYROID NODULE (BETHESDA CATEGORY II).   10/28/2015 Tumor Marker   Patient's tumor was tested for the following markers: CA125 Results of the tumor marker test revealed 53.2   11/04/2015 Imaging   Stable benign right thyroid nodule   12/16/2015 Tumor Marker   Patient's tumor was tested for the following markers: CA125 Results of the tumor marker test revealed 38.1   03/22/2016 PET scan   Interval progression of hypermetabolic right paratracheal lymph node consistent with metastatic involvement. 2. Stable hypermetabolic right thyroid nodule. Reportedly this has been biopsied in the  past.   03/23/2016 Tumor Marker   Patient's tumor was tested for the following markers: CA125 Results of the tumor marker test revealed 62.4   04/13/2016 - 06/01/2016 Radiation Therapy   She received 56 Gy to the chest in 28 fractions    05/09/2016 Imaging   No evidence of left lower extremity deep vein thrombosis. No evidence of a superficial thrombosis of the greater and lesser saphenous veins. Positive for thrombus noted in several varicosities of the calf. No evidence of Baker's cyst on the left.   05/17/2016 Tumor Marker   Patient's tumor was tested for the following markers: CA125 Results of the tumor marker test revealed 9   06/29/2016 Tumor Marker   Patient's tumor was tested for the following markers: CA125 Results of the tumor marker test revealed 5.9   08/04/2016 Tumor Marker   Patient's tumor was tested for the following markers: CA125 Results of the tumor marker test revealed 6.0   09/12/2016 PET scan   Complete metabolic response to therapy, with resolution of hypermetabolic mediastinal lymphadenopathy since prior exam. No residual or new metastatic disease identified. Stable hypermetabolic right thyroid lobe nodule, which was previously biopsied on 09/20/2015.   03/13/2017 PET scan   1. Solitary focus of recurrent right paratracheal hypermetabolic activity, with a 1.0 cm right lower paratracheal node having a maximum SUV of 9.0. Appearance compatible with recurrent malignancy. 2. Continued hypermetabolic right thyroid nodule, previously biopsied, presumed benign -correlate with prior biopsy results. 3. Other imaging findings of potential clinical significance: Aortic Atherosclerosis (ICD10-I70.0). Mild cardiomegaly. Prominent stool throughout the colon favors constipation.   04/02/2017 Pathology Results   FINE NEEDLE ASPIRATION, ENDOSCOPIC, (EBUS) 4 R NODE (SPECIMEN 1 OF 2 COLLECTED 04/02/17): MALIGNANT CELLS PRESENT, CONSISTENT WITH CARCINOMA. SEE COMMENT. COMMENT: THE  MALIGNANT CELLS ARE POSITIVE FOR P53 AND NEGATIVE FOR ESTROGEN RECEPTOR AND TTF-1. THIS PROFILE IS NON-SPECIFIC, BUT THE P53 POSITIVE STAINING IS SUGGESTIVE OF GYNECOLOGIC  PRIMARY.    04/11/2017 Procedure   Successful 8 French right internal jugular vein power port placement with its tip at the SVC/RA junction   04/12/2017 Imaging   Normal LV size with mild LV hypertrophy. EF 55-60%. Normal RV size and systolic function. Aortic valve sclerosis without significant stenosis.   04/18/2017 - 06/14/2017 Chemotherapy   The patient had chemotherapy with Doxil    07/10/2017 Imaging   - Left ventricle: The cavity size was normal. There was mild concentric hypertrophy. Systolic function was normal. The estimated ejection fraction was in the range of 55% to 60%. Wall motion was normal; there were no regional wall motion abnormalities. Doppler parameters are consistent with abnormal left ventricular relaxation (grade 1 diastolic dysfunction). - Aortic valve: Noncoronary cusp mobility was mildly restricted. - Mitral valve: There was mild regurgitation. - Left atrium: The atrium was mildly dilated. - Atrial septum: No defect or patent foramen ovale was identified.  Impressions:  - Compared to November 2018, global LV longitudinal strain remains normal (has increased)   07/11/2017 PET scan   Increased size and hypermetabolic activity of 3.3 cm right thyroid lobe nodule. Thyroid carcinoma cannot be excluded. Recommend repeat ultrasound guided fine needle aspiration to exclude thyroid carcinoma.  New adjacent hypermetabolic 10 mm right supraclavicular lymph node, suspicious for lymph node metastasis.  Slight increase in size and hypermetabolic activity of solitary right paratracheal lymph node.  No evidence of abdominal or pelvic metastatic disease.   07/18/2017 Procedure   1. Technically successful ultrasound guided fine needle aspiration of indeterminate hypermetabolic right-sided thyroid nodule/mass. 2.  Technically successful ultrasound-guided core needle biopsy of hypermetabolic right lower cervical lymph node.   07/18/2017 Pathology Results   THYROID, FINE NEEDLE ASPIRATION RIGHT (SPECIMEN 1 OF 1, COLLECTED ON 07/18/2017): ATYPIA OF UNDETERMINED SIGNIFICANCE OR FOLLICULAR LESION OF UNDETERMINED SIGNIFICANCE (BETHESDA CATEGORY III). SEE COMMENT. COMMENT: THE SPECIMEN CONSISTS OF SMALL AND MEDIUM SIZED GROUPS OF FOLLICULAR EPITHELIAL CELLS WITH MILD CYTOLOGIC ATYPIA INCLUDING NUCLEAR ENLARGEMENT AND HURTHLE CELL CHANGE. SOME GROUPS ARE ARRANGED AS MICROFOLLICLES. THERE IS MINIMAL BACKGROUND COLLOID. BASED ON THESE FEATURES, A FOLLICULAR LESION/NEOPLASM CAN NOT BE ENTIRELY RULED OUT. A SPECIMEN WILL BE SENT FOR AFIRMA TESTING.   07/19/2017 Pathology Results   Lymph node, needle/core biopsy - METASTATIC PAPILLARY SEROUS CARCINOMA. - SEE COMMENT. Microscopic Comment Dr. Vicente Males has reviewed the case and concurs with this interpretation   10/15/2017 PET scan   No new or progressive disease. No evidence of abdominal or pelvic metastatic disease.  Stable hypermetabolic right thyroid lobe nodule and adjacent right supraclavicular lymph node.  Decreased size and hypermetabolic activity of solitary right paratracheal lymph node.   01/23/2018 PET scan   1. Overall, no significant change from PET-CT of 3 months ago. There is persistent hypermetabolic activity at the right thoracic inlet and within a right paratracheal mediastinal node. The nodes have not significantly changed in size, although the metabolic activity has minimally increased over this interval. It is uncertain how much of the thoracic inlet metabolic activity is attributed to the thyroid nodule versus adjacent cervical lymph nodes, both previously biopsied. 2. No new hypermetabolic activity within the neck, chest, abdomen or pelvis. No evidence of local recurrence in the pelvis. 3. Stable probable radiation changes in the right lung.    04/16/2018 PET scan   1. Interval increase in metabolic activity of the RIGHT supraclavicular node and RIGHT lower paratracheal mediastinal node with minimal change in size. Findings consistent with persistent and mildly progressed metastatic adenopathy.  2. New hypermetabolic small lymph node in the RIGHT lower paratracheal nodal station adjacent to the previously followed node. 3. No evidence of local recurrence in the pelvis or new disease elsewhere.   07/30/2018 PET scan   1. Mild interval progression of right supraclavicular, mediastinal, and right hilar hypermetabolic metastatic disease. There is a new hypermetabolic lymph node in the subcarinal station on today's study. 2. No hypermetabolic disease in the abdomen or pelvis to suggest recurrent/metastases. 3.  Aortic Atherosclerois (ICD10-170.0)    08/16/2018 -  Chemotherapy   The patient had Lenvima since 3/6 and pembrolizumab since 3/13 for chemotherapy treatment. Michel Santee was held temporarily due to infection and severe hypertension   10/31/2018 PET scan   Partial response to therapy, with decreased hypermetabolic lymphadenopathy in mediastinum and right hilar region.  No significant change in hypermetabolic lymphadenopathy in right inferior neck.  No new or progressive metastatic disease identified. No evidence of recurrent or metastatic carcinoma in the pelvis or abdomen   02/04/2019 PET scan   1. Decrease in metabolic activity of RIGHT supraclavicular node and RIGHT paratracheal lymph nodes. 2. Persistent activity in the RIGHT thyroid nodule unchanged. 3. Diffuse activity through the esophagus is favored esophagitis. No change. 4. Scattered foci of intense metabolic activity associated the bowel without focal lesion on CT favored benign. 5. No evidence of peritoneal metastasis. 6. No evidence of metastatic adenopathy in the abdomen pelvis. 7. No evidence of local pelvic sidewall recurrence.   07/30/2019 PET scan   1. Mildly  increased uptake within small nodes along the right sternocleidomastoid without change in size and associated with increased deltoid activity may reflect changes related to right arm injection or recent COVID vaccination, consider clinical correlation and attention on follow-up. 2. Persistent activity in the right thyroid nodule, unchanged from previous exams. 3. New activity in a juxta esophageal lymph node in the chest is suspicious though the node is unchanged with respect to size. Endoscopic correlation could potentially be helpful or close attention on follow-up. 4. Subtle increased FDG uptake along the left mainstem bronchus and adjacent to the aorta may represent a small lymph node. No discrete correlate is demonstrated on today's study.   10/22/2019 PET scan   1. Persistent FDG avid subcentimeter right supraclavicular and juxta esophageal lymph nodes. The degree of FDG uptake is similar to the previous exam. No new or progressive findings identified. 2. No findings of solid organ metastasis, skeletal metastasis or metastatic disease to the abdomen or pelvis. 3. FDG avid right lobe of thyroid gland nodule is again noted This nodule is not incidental and been worked up previously with 2 biopsies. Please refer to results from the most recent biopsy dated 07/18/2017. (Ref: J Am Coll Radiol. 2015 Feb;12(2): 143-50). 4.  Aortic Atherosclerosis (ICD10-I70.0).   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      REVIEW OF SYSTEMS:   Constitutional: Denies  fevers, chills or abnormal weight loss Eyes: Denies blurriness of vision Ears, nose, mouth, throat, and face: Denies mucositis or sore throat Respiratory: Denies cough, dyspnea or wheezes Cardiovascular: Denies palpitation,  chest discomfort or lower extremity swelling Gastrointestinal:  Denies nausea, heartburn or change in bowel habits Skin: Denies abnormal skin rashes Lymphatics: Denies new lymphadenopathy or easy bruising Neurological:Denies numbness, tingling or new weaknesses Behavioral/Psych: Mood is stable, no new changes  All other systems were reviewed with the patient and are negative.  I have reviewed the past medical history, past surgical history, social history and family history with the patient and they are unchanged from previous note.  ALLERGIES:  has No Known Allergies.  MEDICATIONS:  Current Outpatient Medications  Medication Sig Dispense Refill  . amLODipine (NORVASC) 10 MG tablet TAKE 1 TABLET BY MOUTH DAILY. DECREASE SIMVASTATIN TO 20MG DAILY WHILE ON AMLODIPINE PER MD 90 tablet 1  . busPIRone (BUSPAR) 10 MG tablet Take 10 mg by mouth 2 (two) times daily.    . Calcium Carb-Cholecalciferol 500-600 MG-UNIT TABS Take 1 tablet by mouth daily.     . Cholecalciferol (VITAMIN D3) 2000 units TABS Take 2,000 Units by mouth daily.     . hydrochlorothiazide (HYDRODIURIL) 50 MG tablet TAKE 1 TABLET (50 MG TOTAL) BY MOUTH DAILY. 90 tablet 11  . LENVIMA, 10 MG DAILY DOSE, capsule TAKE 1 CAPSULE (10 MG TOTAL) BY MOUTH DAILY. 30 capsule 11  . levothyroxine (SYNTHROID) 75 MCG tablet Take 1 tablet (75 mcg total) by mouth daily before breakfast. 90 tablet 2  . LORazepam (ATIVAN) 0.5 MG tablet Take 0.5 mg by mouth 2 (two) times daily as needed.  0  . losartan (COZAAR) 50 MG tablet TAKE 1 TABLET (50 MG TOTAL) BY MOUTH DAILY. 30 tablet 11  . metoprolol tartrate (LOPRESSOR) 25 MG tablet Take 1 tablet (25 mg total) by mouth 2 (two) times daily. 180 tablet 3  . ondansetron (ZOFRAN) 8 MG  tablet Take 1 tablet (8 mg total) by mouth every 8 (eight) hours as needed for nausea. 30 tablet 3  . pantoprazole (PROTONIX) 40 MG tablet Take 1 tablet (40 mg total) by mouth daily. 90 tablet 3  . simvastatin (ZOCOR) 40 MG tablet Take 40 mg by mouth every evening.    . tacrolimus (PROTOPIC) 0.1 % ointment Apply 1 application topically daily as needed (rash).     . urea (CARMOL) 40 % CREA Apply 1 application topically daily. 85 g 1  . venlafaxine XR (EFFEXOR-XR) 75 MG 24 hr capsule Take 300 mg by mouth daily with breakfast.      No current facility-administered medications for this visit.    PHYSICAL EXAMINATION: ECOG PERFORMANCE STATUS: 1 - Symptomatic but completely ambulatory  Vitals:   12/04/19 0924  BP: 129/64  Pulse: 73  Resp: 18  Temp: 98 F (36.7 C)  SpO2: 97%   Filed Weights   12/04/19 0924  Weight: 156 lb (70.8 kg)    GENERAL:alert, no distress and comfortable SKIN: skin color, texture, turgor are normal, no rashes or significant lesions EYES: normal, Conjunctiva are pink and non-injected, sclera clear OROPHARYNX:no exudate, no erythema and lips, buccal mucosa, and tongue normal  NECK: supple, thyroid normal size, non-tender, without nodularity LYMPH:  no palpable lymphadenopathy in the cervical, axillary or inguinal LUNGS: clear to auscultation and percussion with normal breathing effort HEART: regular rate & rhythm and no murmurs and no lower extremity edema ABDOMEN:abdomen soft, non-tender and normal bowel sounds Musculoskeletal:no cyanosis of digits and no clubbing  NEURO: alert & oriented x 3 with fluent speech, no focal motor/sensory deficits  LABORATORY DATA:  I have reviewed the data as listed    Component Value  Date/Time   NA 136 11/13/2019 0840   NA 141 05/17/2017 0957   K 3.8 11/13/2019 0840   K 3.6 05/17/2017 0957   CL 99 11/13/2019 0840   CL 105 11/19/2012 0848   CO2 26 11/13/2019 0840   CO2 25 05/17/2017 0957   GLUCOSE 100 (H) 11/13/2019 0840    GLUCOSE 85 05/17/2017 0957   GLUCOSE 122 (H) 11/19/2012 0848   BUN 27 (H) 11/13/2019 0840   BUN 15.2 05/17/2017 0957   CREATININE 1.00 11/13/2019 0840   CREATININE 0.83 07/10/2019 1240   CREATININE 0.8 05/17/2017 0957   CALCIUM 9.9 11/13/2019 0840   CALCIUM 9.6 05/17/2017 0957   PROT 7.5 11/13/2019 0840   PROT 7.0 05/17/2017 0957   ALBUMIN 4.0 11/13/2019 0840   ALBUMIN 3.9 05/17/2017 0957   AST 23 11/13/2019 0840   AST 23 07/10/2019 1240   AST 25 05/17/2017 0957   ALT 23 11/13/2019 0840   ALT 20 07/10/2019 1240   ALT 18 05/17/2017 0957   ALKPHOS 54 11/13/2019 0840   ALKPHOS 57 05/17/2017 0957   BILITOT 0.5 11/13/2019 0840   BILITOT 0.4 07/10/2019 1240   BILITOT 0.47 05/17/2017 0957   GFRNONAA 59 (L) 11/13/2019 0840   GFRNONAA >60 07/10/2019 1240   GFRAA >60 11/13/2019 0840   GFRAA >60 07/10/2019 1240    No results found for: SPEP, UPEP  Lab Results  Component Value Date   WBC 4.0 12/04/2019   NEUTROABS 2.5 12/04/2019   HGB 10.0 (L) 12/04/2019   HCT 31.1 (L) 12/04/2019   MCV 97.8 12/04/2019   PLT 169 12/04/2019      Chemistry      Component Value Date/Time   NA 136 11/13/2019 0840   NA 141 05/17/2017 0957   K 3.8 11/13/2019 0840   K 3.6 05/17/2017 0957   CL 99 11/13/2019 0840   CL 105 11/19/2012 0848   CO2 26 11/13/2019 0840   CO2 25 05/17/2017 0957   BUN 27 (H) 11/13/2019 0840   BUN 15.2 05/17/2017 0957   CREATININE 1.00 11/13/2019 0840   CREATININE 0.83 07/10/2019 1240   CREATININE 0.8 05/17/2017 0957   GLU 158 (H) 01/14/2015 1035      Component Value Date/Time   CALCIUM 9.9 11/13/2019 0840   CALCIUM 9.6 05/17/2017 0957   ALKPHOS 54 11/13/2019 0840   ALKPHOS 57 05/17/2017 0957   AST 23 11/13/2019 0840   AST 23 07/10/2019 1240   AST 25 05/17/2017 0957   ALT 23 11/13/2019 0840   ALT 20 07/10/2019 1240   ALT 18 05/17/2017 0957   BILITOT 0.5 11/13/2019 0840   BILITOT 0.4 07/10/2019 1240   BILITOT 0.47 05/17/2017 0957

## 2019-12-04 NOTE — Telephone Encounter (Signed)
Scheduled appts per 7/1 los. Pt declined print out of AVS and stated she would refer to mychart.

## 2019-12-16 ENCOUNTER — Other Ambulatory Visit: Payer: Self-pay | Admitting: Hematology and Oncology

## 2019-12-17 ENCOUNTER — Encounter: Payer: Self-pay | Admitting: Hematology and Oncology

## 2019-12-17 ENCOUNTER — Other Ambulatory Visit: Payer: Self-pay | Admitting: Hematology and Oncology

## 2019-12-17 MED FILL — METOPROLOL TARTRATE 25 MG T: 25 | 90 days supply | Qty: 180 | Fill #0

## 2019-12-18 ENCOUNTER — Telehealth: Payer: Self-pay

## 2019-12-18 MED FILL — AMLODIPINE BESYLATE 10 MG T: 10 | 90 days supply | Qty: 90 | Fill #1

## 2019-12-18 MED FILL — LOSARTAN POTASSIUM 50 MG TA: 50 | 30 days supply | Qty: 30 | Fill #3

## 2019-12-18 NOTE — Telephone Encounter (Signed)
Returned TC to pt and let her know that Dr Alvy Bimler stated that  If she has already maxed out on imodium she can send in prescriptions for lomotil  If not, she recommend scheduled 4 mg (2 tabs OTC imodium) 4 times a day first for a few days. Patient stated that she had not taken imodium consistently and would try that first.

## 2019-12-18 NOTE — Telephone Encounter (Signed)
Patient question: Patient stated that she has been having diarrhea for over a week. She's been taking imodium which works some of the time. She would like to know if you suggest anything else that can stop the diarrhea?  Dr Alvy Bimler made aware of patient concern

## 2019-12-18 NOTE — Telephone Encounter (Signed)
Left voicemail for patient to call back [facility] 

## 2019-12-23 MED FILL — LENVIMA 10 MG DAILY DOSE: 10 | 30 days supply | Qty: 30 | Fill #3

## 2019-12-25 ENCOUNTER — Inpatient Hospital Stay: Payer: 59

## 2019-12-25 ENCOUNTER — Other Ambulatory Visit: Payer: Self-pay

## 2019-12-25 VITALS — BP 116/65 | HR 76 | Temp 98.0°F | Resp 16 | Wt 152.5 lb

## 2019-12-25 DIAGNOSIS — C541 Malignant neoplasm of endometrium: Secondary | ICD-10-CM | POA: Diagnosis not present

## 2019-12-25 DIAGNOSIS — T451X5A Adverse effect of antineoplastic and immunosuppressive drugs, initial encounter: Secondary | ICD-10-CM | POA: Diagnosis not present

## 2019-12-25 DIAGNOSIS — I10 Essential (primary) hypertension: Secondary | ICD-10-CM | POA: Diagnosis not present

## 2019-12-25 DIAGNOSIS — D6481 Anemia due to antineoplastic chemotherapy: Secondary | ICD-10-CM | POA: Diagnosis not present

## 2019-12-25 DIAGNOSIS — Z9221 Personal history of antineoplastic chemotherapy: Secondary | ICD-10-CM | POA: Diagnosis not present

## 2019-12-25 DIAGNOSIS — C770 Secondary and unspecified malignant neoplasm of lymph nodes of head, face and neck: Secondary | ICD-10-CM

## 2019-12-25 DIAGNOSIS — Z95828 Presence of other vascular implants and grafts: Secondary | ICD-10-CM

## 2019-12-25 DIAGNOSIS — R809 Proteinuria, unspecified: Secondary | ICD-10-CM | POA: Diagnosis not present

## 2019-12-25 DIAGNOSIS — Z7189 Other specified counseling: Secondary | ICD-10-CM

## 2019-12-25 DIAGNOSIS — Z923 Personal history of irradiation: Secondary | ICD-10-CM | POA: Diagnosis not present

## 2019-12-25 DIAGNOSIS — E039 Hypothyroidism, unspecified: Secondary | ICD-10-CM | POA: Diagnosis not present

## 2019-12-25 DIAGNOSIS — Z5112 Encounter for antineoplastic immunotherapy: Secondary | ICD-10-CM | POA: Diagnosis not present

## 2019-12-25 LAB — CBC WITH DIFFERENTIAL (CANCER CENTER ONLY)
Abs Immature Granulocytes: 0.01 10*3/uL (ref 0.00–0.07)
Basophils Absolute: 0 10*3/uL (ref 0.0–0.1)
Basophils Relative: 1 %
Eosinophils Absolute: 0.2 10*3/uL (ref 0.0–0.5)
Eosinophils Relative: 4 %
HCT: 29.9 % — ABNORMAL LOW (ref 36.0–46.0)
Hemoglobin: 9.9 g/dL — ABNORMAL LOW (ref 12.0–15.0)
Immature Granulocytes: 0 %
Lymphocytes Relative: 15 %
Lymphs Abs: 0.9 10*3/uL (ref 0.7–4.0)
MCH: 31.6 pg (ref 26.0–34.0)
MCHC: 33.1 g/dL (ref 30.0–36.0)
MCV: 95.5 fL (ref 80.0–100.0)
Monocytes Absolute: 0.4 10*3/uL (ref 0.1–1.0)
Monocytes Relative: 6 %
Neutro Abs: 4.1 10*3/uL (ref 1.7–7.7)
Neutrophils Relative %: 74 %
Platelet Count: 161 10*3/uL (ref 150–400)
RBC: 3.13 MIL/uL — ABNORMAL LOW (ref 3.87–5.11)
RDW: 13.3 % (ref 11.5–15.5)
WBC Count: 5.6 10*3/uL (ref 4.0–10.5)
nRBC: 0 % (ref 0.0–0.2)

## 2019-12-25 LAB — COMPREHENSIVE METABOLIC PANEL
ALT: 17 U/L (ref 0–44)
AST: 24 U/L (ref 15–41)
Albumin: 3.7 g/dL (ref 3.5–5.0)
Alkaline Phosphatase: 44 U/L (ref 38–126)
Anion gap: 13 (ref 5–15)
BUN: 24 mg/dL — ABNORMAL HIGH (ref 8–23)
CO2: 24 mmol/L (ref 22–32)
Calcium: 9.9 mg/dL (ref 8.9–10.3)
Chloride: 102 mmol/L (ref 98–111)
Creatinine, Ser: 0.95 mg/dL (ref 0.44–1.00)
GFR calc Af Amer: >60 mL/min (ref >60)
GFR calc non Af Amer: >60 mL/min (ref >60)
Glucose, Bld: 84 mg/dL (ref 70–99)
Potassium: 3.8 mmol/L (ref 3.5–5.1)
Sodium: 139 mmol/L (ref 135–145)
Total Bilirubin: 0.6 mg/dL (ref 0.3–1.2)
Total Protein: 7.2 g/dL (ref 6.5–8.1)

## 2019-12-25 MED ORDER — SODIUM CHLORIDE 0.9 % IV SOLN
200.0000 mg | Freq: Once | INTRAVENOUS | Status: AC
  Administered 2019-12-25: 200 mg via INTRAVENOUS
  Filled 2019-12-25: qty 8

## 2019-12-25 MED ORDER — SODIUM CHLORIDE 0.9 % IV SOLN
Freq: Once | INTRAVENOUS | Status: AC
  Filled 2019-12-25: qty 250

## 2019-12-25 MED ORDER — SODIUM CHLORIDE 0.9% FLUSH
10.0000 mL | Freq: Once | INTRAVENOUS | Status: AC
  Administered 2019-12-25: 10 mL
  Filled 2019-12-25: qty 10

## 2019-12-25 MED ORDER — SODIUM CHLORIDE 0.9% FLUSH
10.0000 mL | INTRAVENOUS | Status: DC | PRN
  Administered 2019-12-25: 10 mL
  Filled 2019-12-25: qty 10

## 2019-12-25 MED ORDER — HEPARIN SOD (PORK) LOCK FLUSH 100 UNIT/ML IV SOLN
500.0000 [IU] | Freq: Once | INTRAVENOUS | Status: AC | PRN
  Administered 2019-12-25: 500 [IU]
  Filled 2019-12-25: qty 5

## 2019-12-25 NOTE — Patient Instructions (Signed)
[facility] Discharge Instructions for Patients Receiving Chemotherapy  Today you received the following Immunotherapy agent: Pembrolizumab (Keytruda)  To help prevent nausea and vomiting after your treatment, we encourage you to take your nausea medication as directed by your MD.   If you develop nausea and vomiting that is not controlled by your nausea medication, call the clinic.   BELOW ARE SYMPTOMS THAT SHOULD BE REPORTED IMMEDIATELY:  *FEVER GREATER THAN 100.5 F  *CHILLS WITH OR WITHOUT FEVER  NAUSEA AND VOMITING THAT IS NOT CONTROLLED WITH YOUR NAUSEA MEDICATION  *UNUSUAL SHORTNESS OF BREATH  *UNUSUAL BRUISING OR BLEEDING  TENDERNESS IN MOUTH AND THROAT WITH OR WITHOUT PRESENCE OF ULCERS  *URINARY PROBLEMS  *BOWEL PROBLEMS  UNUSUAL RASH Items with * indicate a potential emergency and should be followed up as soon as possible.  Feel free to call the clinic should you have any questions or concerns. The clinic phone number is [phone].  Please show the CHEMO ALERT CARD at check-in to the Emergency Department and triage nurse.   

## 2019-12-25 NOTE — Patient Instructions (Signed)

## 2020-01-08 ENCOUNTER — Other Ambulatory Visit (HOSPITAL_COMMUNITY): Payer: Self-pay | Admitting: *Deleted

## 2020-01-08 DIAGNOSIS — F064 Anxiety disorder due to known physiological condition: Secondary | ICD-10-CM | POA: Diagnosis not present

## 2020-01-08 MED FILL — VENLAFAXINE HCL ER 150 MG C: 150 | 90 days supply | Qty: 180 | Fill #0

## 2020-01-09 MED FILL — TACROLIMUS 0.1% OINTMENT: 0.1 | 30 days supply | Qty: 30 | Fill #0

## 2020-01-15 ENCOUNTER — Other Ambulatory Visit: Payer: Self-pay

## 2020-01-15 ENCOUNTER — Inpatient Hospital Stay (HOSPITAL_BASED_OUTPATIENT_CLINIC_OR_DEPARTMENT_OTHER): Payer: 59 | Admitting: Hematology and Oncology

## 2020-01-15 ENCOUNTER — Other Ambulatory Visit: Payer: Self-pay | Admitting: Hematology and Oncology

## 2020-01-15 ENCOUNTER — Encounter: Payer: Self-pay | Admitting: Hematology and Oncology

## 2020-01-15 ENCOUNTER — Telehealth: Payer: Self-pay | Admitting: Hematology and Oncology

## 2020-01-15 ENCOUNTER — Inpatient Hospital Stay: Payer: 59

## 2020-01-15 ENCOUNTER — Telehealth: Payer: Self-pay

## 2020-01-15 ENCOUNTER — Inpatient Hospital Stay: Payer: 59 | Attending: Hematology and Oncology

## 2020-01-15 DIAGNOSIS — E039 Hypothyroidism, unspecified: Secondary | ICD-10-CM

## 2020-01-15 DIAGNOSIS — Z9221 Personal history of antineoplastic chemotherapy: Secondary | ICD-10-CM | POA: Insufficient documentation

## 2020-01-15 DIAGNOSIS — R197 Diarrhea, unspecified: Secondary | ICD-10-CM | POA: Insufficient documentation

## 2020-01-15 DIAGNOSIS — Z923 Personal history of irradiation: Secondary | ICD-10-CM | POA: Insufficient documentation

## 2020-01-15 DIAGNOSIS — T451X5A Adverse effect of antineoplastic and immunosuppressive drugs, initial encounter: Secondary | ICD-10-CM | POA: Diagnosis not present

## 2020-01-15 DIAGNOSIS — I10 Essential (primary) hypertension: Secondary | ICD-10-CM

## 2020-01-15 DIAGNOSIS — Z79899 Other long term (current) drug therapy: Secondary | ICD-10-CM | POA: Diagnosis not present

## 2020-01-15 DIAGNOSIS — Z7189 Other specified counseling: Secondary | ICD-10-CM

## 2020-01-15 DIAGNOSIS — C541 Malignant neoplasm of endometrium: Secondary | ICD-10-CM | POA: Diagnosis not present

## 2020-01-15 DIAGNOSIS — C772 Secondary and unspecified malignant neoplasm of intra-abdominal lymph nodes: Secondary | ICD-10-CM

## 2020-01-15 DIAGNOSIS — Z5112 Encounter for antineoplastic immunotherapy: Secondary | ICD-10-CM | POA: Diagnosis not present

## 2020-01-15 DIAGNOSIS — Z95828 Presence of other vascular implants and grafts: Secondary | ICD-10-CM

## 2020-01-15 DIAGNOSIS — R946 Abnormal results of thyroid function studies: Secondary | ICD-10-CM | POA: Insufficient documentation

## 2020-01-15 DIAGNOSIS — D6481 Anemia due to antineoplastic chemotherapy: Secondary | ICD-10-CM | POA: Diagnosis not present

## 2020-01-15 DIAGNOSIS — C770 Secondary and unspecified malignant neoplasm of lymph nodes of head, face and neck: Secondary | ICD-10-CM

## 2020-01-15 LAB — COMPREHENSIVE METABOLIC PANEL
ALT: 16 U/L (ref 0–44)
AST: 22 U/L (ref 15–41)
Albumin: 3.7 g/dL (ref 3.5–5.0)
Alkaline Phosphatase: 49 U/L (ref 38–126)
Anion gap: 9 (ref 5–15)
BUN: 22 mg/dL (ref 8–23)
CO2: 25 mmol/L (ref 22–32)
Calcium: 10.4 mg/dL — ABNORMAL HIGH (ref 8.9–10.3)
Chloride: 102 mmol/L (ref 98–111)
Creatinine, Ser: 0.92 mg/dL (ref 0.44–1.00)
GFR calc Af Amer: >60 mL/min (ref >60)
GFR calc non Af Amer: >60 mL/min (ref >60)
Glucose, Bld: 79 mg/dL (ref 70–99)
Potassium: 4.3 mmol/L (ref 3.5–5.1)
Sodium: 136 mmol/L (ref 135–145)
Total Bilirubin: 0.5 mg/dL (ref 0.3–1.2)
Total Protein: 7.4 g/dL (ref 6.5–8.1)

## 2020-01-15 LAB — CBC WITH DIFFERENTIAL/PLATELET
Abs Immature Granulocytes: 0.01 10*3/uL (ref 0.00–0.07)
Basophils Absolute: 0 10*3/uL (ref 0.0–0.1)
Basophils Relative: 1 %
Eosinophils Absolute: 0.2 10*3/uL (ref 0.0–0.5)
Eosinophils Relative: 4 %
HCT: 31.4 % — ABNORMAL LOW (ref 36.0–46.0)
Hemoglobin: 10.2 g/dL — ABNORMAL LOW (ref 12.0–15.0)
Immature Granulocytes: 0 %
Lymphocytes Relative: 17 %
Lymphs Abs: 0.9 10*3/uL (ref 0.7–4.0)
MCH: 30.8 pg (ref 26.0–34.0)
MCHC: 32.5 g/dL (ref 30.0–36.0)
MCV: 94.9 fL (ref 80.0–100.0)
Monocytes Absolute: 0.4 10*3/uL (ref 0.1–1.0)
Monocytes Relative: 8 %
Neutro Abs: 3.5 10*3/uL (ref 1.7–7.7)
Neutrophils Relative %: 70 %
Platelets: 181 10*3/uL (ref 150–400)
RBC: 3.31 MIL/uL — ABNORMAL LOW (ref 3.87–5.11)
RDW: 13 % (ref 11.5–15.5)
WBC: 4.9 10*3/uL (ref 4.0–10.5)
nRBC: 0 % (ref 0.0–0.2)

## 2020-01-15 LAB — TOTAL PROTEIN, URINE DIPSTICK

## 2020-01-15 LAB — TSH: TSH: 4.834 u[IU]/mL — ABNORMAL HIGH (ref 0.308–3.960)

## 2020-01-15 MED ORDER — SODIUM CHLORIDE 0.9 % IV SOLN
200.0000 mg | Freq: Once | INTRAVENOUS | Status: AC
  Administered 2020-01-15: 200 mg via INTRAVENOUS
  Filled 2020-01-15: qty 8

## 2020-01-15 MED ORDER — SODIUM CHLORIDE 0.9% FLUSH
10.0000 mL | Freq: Once | INTRAVENOUS | Status: AC
  Administered 2020-01-15: 10 mL
  Filled 2020-01-15: qty 10

## 2020-01-15 MED ORDER — LEVOTHYROXINE SODIUM 88 MCG PO TABS: 88.0000 ug | ORAL_TABLET | Freq: Every day | ORAL | 1 refills | Status: DC

## 2020-01-15 MED ORDER — SODIUM CHLORIDE 0.9% FLUSH
10.0000 mL | INTRAVENOUS | Status: DC | PRN
  Administered 2020-01-15: 10 mL
  Filled 2020-01-15: qty 10

## 2020-01-15 MED ORDER — HEPARIN SOD (PORK) LOCK FLUSH 100 UNIT/ML IV SOLN
500.0000 [IU] | Freq: Once | INTRAVENOUS | Status: AC | PRN
  Administered 2020-01-15: 500 [IU]
  Filled 2020-01-15: qty 5

## 2020-01-15 MED ORDER — SODIUM CHLORIDE 0.9 % IV SOLN
Freq: Once | INTRAVENOUS | Status: AC
  Filled 2020-01-15: qty 250

## 2020-01-15 NOTE — Assessment & Plan Note (Signed)
This is not diet related It could be due to side effects of Lenvima or pembrolizumab I recommend spacing out Lenvima to every other day If that does not help, I plan to lengthen the interval between pembrolizumab in the future I also recommend her to take Imodium on a regular basis

## 2020-01-15 NOTE — Progress Notes (Signed)
[place] OFFICE PROGRESS NOTE  Patient Care Team: [name], MD as PCP - General (Family Medicine) Reynold Bowen, MD as Consulting Physician (Endocrinology)  ASSESSMENT & PLAN:  Endometrial cancer [facility]) PET imaging study from Oct 22, 2019 showed stable disease She complained of frequent intermittent loose stool  I suspect this is side effects of either Lenvima or pembrolizumab I recommend spacing out Lenvima to every other day and reassess next visit we will continue treatment indefinitely I recommend lengthening the interval between imaging studies to another, and plan to repeat imaging study in 4-6 months, after the fall  Acquired hypothyroidism She has intermittent elevated TSH I will adjust her thyroid medicine accordingly  Anemia due to antineoplastic chemotherapy This is likely due to recent treatment. The patient denies recent history of bleeding such as epistaxis, hematuria or hematochezia. She is asymptomatic from the anemia. I will observe for now  Diarrhea This is not diet related It could be due to side effects of Lenvima or pembrolizumab I recommend spacing out Lenvima to every other day If that does not help, I plan to lengthen the interval between pembrolizumab in the future I also recommend her to take Imodium on a regular basis   No orders of the defined types were placed in this encounter.   All questions were answered. The patient knows to call the clinic with any problems, questions or concerns. The total time spent in the appointment was 20 minutes encounter with patients including review of chart and various tests results, discussions about plan of care and coordination of care plan   [name], MD 01/15/2020 11:45 AM  INTERVAL HISTORY: Please see below for problem oriented charting. She returns for treatment and follow-up She complains of intermittent diarrhea It is not food related No recent fever or chills Denies infusion  reactions  SUMMARY OF ONCOLOGIC HISTORY: Oncology History Overview Note  [employer] testing done 11-2015 on surgical path from 2014: MS stable TMB low 4 muts/mb ATM [id number] ERBB3 T389K E2H2 rearrangement exon 9 PPP2R1A P179R TP53 I195N    ER- APPROXIMATELY 25-35% STAINING IN NEOPLASTIC CELLS (INTERMEDIATE)  PR- APPROXIMATELY 25-35% STAINING IN NEOPLASTIC CELLS (STRONG)  Repeat biopsy 04/02/17: ER negative, Her 2 negative  Progressed on Doxil   Endometrial cancer ([place])  06/20/2012 Pathology Results   Biopsy positive for papillary serous carcinoma   06/20/2012 Genetic Testing   [employer] One testing done 11-2015 on surgical path from 2014: MS stable TMB low 4 muts/mb ATM [id number] ERBB3 T389K E2H2 rearrangement exon 9 PPP2R1A P179R TP53 I195N    06/20/2012 Initial Diagnosis   Patient presented to PCP with intermittent vaginal bleeding since ~ Oct 2013, endometrial biopsy 06-20-12 with complex endometrial hyperplasia with atypia   06/26/2012 Imaging   Thickened endometrial lining in a postmenopausal patient experiencing vaginal bleeding. In the setting of post-menopausal bleeding, endometrial sampling is indicated to exclude carcinoma. No focal myometrial abnormalities are seen.  Normal left ovary and non-visualized right ovary   07/30/2012 Surgery   Dr. Polly Cobia performed robotic hysterectomy with bilateral salpingo-oophorectomy, bilateral pelvic lymph node dissection and periaortic lymph node dissection. Intraoperatively on frozen section, the patient was noted to have a large endometrial polyp with changes within the polyp consistent for high-grade malignancy, possibly papillary serous carcinoma. There is no obvious extrauterine disease noted.     07/30/2012 Pathology Results   [phone] SUPPLEMENTAL REPORT  THE ENDOMETRIAL CARCINOMA WAS ANALYZED FOR DNA MISMATCH REPAIR PROTEINS.IMMUNOHISTOCHEMICALLY, THE NEOPLASM RETAINED NUCLEAR  EXPRESSION OF 4  GENE PRODUCTS, MLH1, MSH2, MSH6, AND PMS2, INVOLVED IN DNA MISMATCH REPAIR.POSITIVE AND NEGATIVE CONTROLS WORKED APPROPRIATELY.  PER REQUEST, AN ER AND PR ARE PERFORMED ON BLOCK 1I.  ER- APPROXIMATELY 25-35% STAINING IN NEOPLASTIC CELLS (INTERMEDIATE)  PR- APPROXIMATELY 25-35% STAINING IN NEOPLASTIC CELLS (STRONG)  ER AND PR PREDOMINANTLY SHOW STAINING IN THE SEROUS COMPONENT.  DIAGNOSIS  1. UTERUS, CERVIX WITH BILATERAL FALLOPIAN TUBES AND OVARIES,  HYSTERECTOMY AND BILATERAL SALPINGO-OOPHORECTOMY:  HIGH GRADE/POORLY DIFFERENTIATED ENDOMETRIAL ADENOCARCINOMA WITH SOLID AND SEROUS PAPILLARY COMPONENTS.  HISTOPATHOLOGIC TYPE:A VARIETY OF PATTERNS WERE PRESENT, ALL OF WHICH SHOULD BE CONSIDERED TO BE HIGH GRADE.SEROUS PAPILLARY CARCINOMA WAS SEEN OCCURRING ADJACENT TO SOLID ENDOMETRIAL CARCINOMA WITH MARKED ANAPLASIA AND GIANT CELLS. SIZE:TUMOR MEASURED AT LEAST 4.8 CM IN GREATEST HORIZONTAL DIMENSION.  GRADE: POORLY DIFFERENTIATED OR HIGH GRADE. DEPTH OF INVASION: NO DEFINITE MYOMETRIAL INVOLVEMENT WAS SEEN.THE TUMOR APPEARED CONFINED TO THE POLYP AS WELL AS SURFACE ENDOMETRIUM. WHERE MYOMETRIAL THICKNESS NOT APPLICABLE. SEROSAL INVOLVEMENT: NOT DEMONSTRATED.  ENDOCERVICAL INVOLVEMENT:NOT DEMONSTRATED. RESECTION MARGINS: FREE OF INVOLVEMENT. EXTRAUTERINE EXTENSION:NOT DEMONSTRATED. ANGIOLYMPHATIC INVASION: NOT DEFINITELY SEEN. TOTAL NODES EXAMINED:22.  PELVIC NODES EXAMINED:20.  PELVIC NODES INVOLVED:0.  PARA-AORTIC NODES 2. EXAMINED:  PARA-AORTIC NODES 0. INVOLVED TNM STAGE: T1A N0 MX. AJCC STAGE GROUPING: IA. FIGO STAGE:IA.   08/26/2012 Imaging   No CT evidence for intra-abdominal or pelvic metastatic disease. Trace free pelvic fluid, presumably postoperative although the date of surgery is not documented in the electronic medical record.   08/26/2012 - 12/13/2012  Chemotherapy   She received 6 cycles of carbo/taxol    10/29/2012 - 11/27/2012 Radiation Therapy   May 27, June 5,  June 11, June 19, November 27, 2012: Proximal vagina 30 Gy in 5 fractions     01/17/2013 Imaging   1.  No evidence of recurrent or metastatic disease. 2.  No acute abnormality involving the abdomen or pelvis. 3.  Mild diffuse hepatic steatosis. 4.  Very small supraumbilical midline anterior abdominal wall hernia containing fat, unchanged   01/22/2014 Imaging   No evidence for metastatic or recurrent disease. 2. No bowel obstruction.  Normal appendix. 3. Small fat containing hernia, stable in appearance. 4. Status post hysterectomy and bilateral oophorectomy   02/19/2014 Imaging   No pulmonary lesions are identified. The abnormality on the chest x-ray is due to asymmetric left-sided sternoclavicular joint degenerative disease   10/12/2014 Imaging   New mild retroperitoneal lymphadenopathy in the left paraaortic region and proximal left common iliac chain, consistent with metastatic disease. No other sites of metastatic disease identified within the abdomen or pelvis.     11/27/2014 - 02/11/2015 Chemotherapy   She received 4 cycles of carbo/taxol    03/01/2015 PET scan   Single hypermetabolic small retroperitoneal lymph node along the aorta. 2. No evidence of metastatic disease otherwise in the abdomen or pelvis. No evidence local recurrence. 3. Intensely hypermetabolic enlarged nodule adjacent to the RIGHT lobe of thyroid gland. This presumably represents the biopsied lesion in clinician report which was found to be benign thyroid tissue.   04/05/2015 - 05/13/2015 Radiation Therapy   She received 50.4 gray in 28 fractions with simultaneous integrated boost to 56 gray   06/17/2015 Imaging   No acute process or evidence of metastatic disease in the abdomen or pelvis. Resolution of previously described retroperitoneal adenopathy. 2.  Possible constipation. 3. Atherosclerosis.    06/17/2015 Tumor Marker   Patient's tumor was tested for the following markers: CA125 Results of the tumor marker test revealed 33   08/12/2015 Tumor  Marker   Patient's tumor was tested for the following markers: CA125 Results of the tumor marker test revealed 52   08/31/2015 PET scan   Development of right paratracheal hypermetabolic adenopathy, consistent with nodal metastasis. 2. The previously described isolated abdominal retroperitoneal hypermetabolic node has resolved. 3. Persistent hypermetabolic right thyroid nodule, per report previously biopsied. Correlate with those results. 4.  Possible constipation.   09/09/2015 -  Anti-estrogen oral therapy   She has been receiving alternative treatment between megace and tamoxifen   09/09/2015 Tumor Marker   Patient's tumor was tested for the following markers: CA125 Results of the tumor marker test revealed 37.8   09/20/2015 Procedure   Technically successful ultrasound-guided thyroid aspiration biopsy , dominant right nodule   09/20/2015 Pathology Results   THYROID, RIGHT, FINE NEEDLE ASPIRATION (SPECIMEN 1 OF 1 COLLECTED 09/20/15): FINDINGS CONSISTENT WITH BENIGN THYROID NODULE (BETHESDA CATEGORY II).   10/28/2015 Tumor Marker   Patient's tumor was tested for the following markers: CA125 Results of the tumor marker test revealed 53.2   11/04/2015 Imaging   Stable benign right thyroid nodule   12/16/2015 Tumor Marker   Patient's tumor was tested for the following markers: CA125 Results of the tumor marker test revealed 38.1   03/22/2016 PET scan   Interval progression of hypermetabolic right paratracheal lymph node consistent with metastatic involvement. 2. Stable hypermetabolic right thyroid nodule. Reportedly this has been biopsied in the past.   03/23/2016 Tumor Marker   Patient's tumor was tested for the following markers: CA125 Results of the tumor marker test revealed 62.4   04/13/2016 - 06/01/2016 Radiation Therapy   She received  56 Gy to the chest in 28 fractions    05/09/2016 Imaging   No evidence of left lower extremity deep vein thrombosis. No evidence of a superficial thrombosis of the greater and lesser saphenous veins. Positive for thrombus noted in several varicosities of the calf. No evidence of Baker's cyst on the left.   05/17/2016 Tumor Marker   Patient's tumor was tested for the following markers: CA125 Results of the tumor marker test revealed 9   06/29/2016 Tumor Marker   Patient's tumor was tested for the following markers: CA125 Results of the tumor marker test revealed 5.9   08/04/2016 Tumor Marker   Patient's tumor was tested for the following markers: CA125 Results of the tumor marker test revealed 6.0   09/12/2016 PET scan   Complete metabolic response to therapy, with resolution of hypermetabolic mediastinal lymphadenopathy since prior exam. No residual or new metastatic disease identified. Stable hypermetabolic right thyroid lobe nodule, which was previously biopsied on 09/20/2015.   03/13/2017 PET scan   1. Solitary focus of recurrent right paratracheal hypermetabolic activity, with a 1.0 cm right lower paratracheal node having a maximum SUV of 9.0. Appearance compatible with recurrent malignancy. 2. Continued hypermetabolic right thyroid nodule, previously biopsied, presumed benign -correlate with prior biopsy results. 3. Other imaging findings of potential clinical significance: Aortic Atherosclerosis (ICD10-I70.0). Mild cardiomegaly. Prominent stool throughout the colon favors constipation.   04/02/2017 Pathology Results   FINE NEEDLE ASPIRATION, ENDOSCOPIC, (EBUS) 4 R NODE (SPECIMEN 1 OF 2 COLLECTED 04/02/17): MALIGNANT CELLS PRESENT, CONSISTENT WITH CARCINOMA. SEE COMMENT. COMMENT: THE MALIGNANT CELLS ARE POSITIVE FOR P53 AND NEGATIVE FOR ESTROGEN RECEPTOR AND TTF-1. THIS PROFILE IS NON-SPECIFIC, BUT THE P53 POSITIVE STAINING IS SUGGESTIVE OF GYNECOLOGIC PRIMARY.    04/11/2017 Procedure    Successful 8 French right internal jugular vein power port placement with its tip at the SVC/RA  junction   04/12/2017 Imaging   Normal LV size with mild LV hypertrophy. EF 55-60%. Normal RV size and systolic function. Aortic valve sclerosis without significant stenosis.   04/18/2017 - 06/14/2017 Chemotherapy   The patient had chemotherapy with Doxil    07/10/2017 Imaging   - Left ventricle: The cavity size was normal. There was mild concentric hypertrophy. Systolic function was normal. The estimated ejection fraction was in the range of 55% to 60%. Wall motion was normal; there were no regional wall motion abnormalities. Doppler parameters are consistent with abnormal left ventricular relaxation (grade 1 diastolic dysfunction). - Aortic valve: Noncoronary cusp mobility was mildly restricted. - Mitral valve: There was mild regurgitation. - Left atrium: The atrium was mildly dilated. - Atrial septum: No defect or patent foramen ovale was identified.  Impressions:  - Compared to November 2018, global LV longitudinal strain remains normal (has increased)   07/11/2017 PET scan   Increased size and hypermetabolic activity of 3.3 cm right thyroid lobe nodule. Thyroid carcinoma cannot be excluded. Recommend repeat ultrasound guided fine needle aspiration to exclude thyroid carcinoma.  New adjacent hypermetabolic 10 mm right supraclavicular lymph node, suspicious for lymph node metastasis.  Slight increase in size and hypermetabolic activity of solitary right paratracheal lymph node.  No evidence of abdominal or pelvic metastatic disease.   07/18/2017 Procedure   1. Technically successful ultrasound guided fine needle aspiration of indeterminate hypermetabolic right-sided thyroid nodule/mass. 2. Technically successful ultrasound-guided core needle biopsy of hypermetabolic right lower cervical lymph node.   07/18/2017 Pathology Results   THYROID, FINE NEEDLE ASPIRATION RIGHT (SPECIMEN 1 OF 1,  COLLECTED ON 07/18/2017): ATYPIA OF UNDETERMINED SIGNIFICANCE OR FOLLICULAR LESION OF UNDETERMINED SIGNIFICANCE (BETHESDA CATEGORY III). SEE COMMENT. COMMENT: THE SPECIMEN CONSISTS OF SMALL AND MEDIUM SIZED GROUPS OF FOLLICULAR EPITHELIAL CELLS WITH MILD CYTOLOGIC ATYPIA INCLUDING NUCLEAR ENLARGEMENT AND HURTHLE CELL CHANGE. SOME GROUPS ARE ARRANGED AS MICROFOLLICLES. THERE IS MINIMAL BACKGROUND COLLOID. BASED ON THESE FEATURES, A FOLLICULAR LESION/NEOPLASM CAN NOT BE ENTIRELY RULED OUT. A SPECIMEN WILL BE SENT FOR AFIRMA TESTING.   07/19/2017 Pathology Results   Lymph node, needle/core biopsy - METASTATIC PAPILLARY SEROUS CARCINOMA. - SEE COMMENT. Microscopic Comment Dr. Vicente Males has reviewed the case and concurs with this interpretation   10/15/2017 PET scan   No new or progressive disease. No evidence of abdominal or pelvic metastatic disease.  Stable hypermetabolic right thyroid lobe nodule and adjacent right supraclavicular lymph node.  Decreased size and hypermetabolic activity of solitary right paratracheal lymph node.   01/23/2018 PET scan   1. Overall, no significant change from PET-CT of 3 months ago. There is persistent hypermetabolic activity at the right thoracic inlet and within a right paratracheal mediastinal node. The nodes have not significantly changed in size, although the metabolic activity has minimally increased over this interval. It is uncertain how much of the thoracic inlet metabolic activity is attributed to the thyroid nodule versus adjacent cervical lymph nodes, both previously biopsied. 2. No new hypermetabolic activity within the neck, chest, abdomen or pelvis. No evidence of local recurrence in the pelvis. 3. Stable probable radiation changes in the right lung.   04/16/2018 PET scan   1. Interval increase in metabolic activity of the RIGHT supraclavicular node and RIGHT lower paratracheal mediastinal node with minimal change in size. Findings consistent with  persistent and mildly progressed metastatic adenopathy. 2. New hypermetabolic small lymph node in the RIGHT lower paratracheal nodal station adjacent to the previously followed node. 3. No evidence of local  recurrence in the pelvis or new disease elsewhere.   07/30/2018 PET scan   1. Mild interval progression of right supraclavicular, mediastinal, and right hilar hypermetabolic metastatic disease. There is a new hypermetabolic lymph node in the subcarinal station on today's study. 2. No hypermetabolic disease in the abdomen or pelvis to suggest recurrent/metastases. 3.  Aortic Atherosclerois (ICD10-170.0)    08/16/2018 -  Chemotherapy   The patient had Lenvima since 3/6 and pembrolizumab since 3/13 for chemotherapy treatment. Michel Santee was held temporarily due to infection and severe hypertension   10/31/2018 PET scan   Partial response to therapy, with decreased hypermetabolic lymphadenopathy in mediastinum and right hilar region.  No significant change in hypermetabolic lymphadenopathy in right inferior neck.  No new or progressive metastatic disease identified. No evidence of recurrent or metastatic carcinoma in the pelvis or abdomen   02/04/2019 PET scan   1. Decrease in metabolic activity of RIGHT supraclavicular node and RIGHT paratracheal lymph nodes. 2. Persistent activity in the RIGHT thyroid nodule unchanged. 3. Diffuse activity through the esophagus is favored esophagitis. No change. 4. Scattered foci of intense metabolic activity associated the bowel without focal lesion on CT favored benign. 5. No evidence of peritoneal metastasis. 6. No evidence of metastatic adenopathy in the abdomen pelvis. 7. No evidence of local pelvic sidewall recurrence.   07/30/2019 PET scan   1. Mildly increased uptake within small nodes along the right sternocleidomastoid without change in size and associated with increased deltoid activity may reflect changes related to right arm injection or recent  COVID vaccination, consider clinical correlation and attention on follow-up. 2. Persistent activity in the right thyroid nodule, unchanged from previous exams. 3. New activity in a juxta esophageal lymph node in the chest is suspicious though the node is unchanged with respect to size. Endoscopic correlation could potentially be helpful or close attention on follow-up. 4. Subtle increased FDG uptake along the left mainstem bronchus and adjacent to the aorta may represent a small lymph node. No discrete correlate is demonstrated on today's study.   10/22/2019 PET scan   1. Persistent FDG avid subcentimeter right supraclavicular and juxta esophageal lymph nodes. The degree of FDG uptake is similar to the previous exam. No new or progressive findings identified. 2. No findings of solid organ metastasis, skeletal metastasis or metastatic disease to the abdomen or pelvis. 3. FDG avid right lobe of thyroid gland nodule is again noted This nodule is not incidental and been worked up previously with 2 biopsies. Please refer to results from the most recent biopsy dated 07/18/2017. (Ref: J Am Coll Radiol. 2015 Feb;12(2): 143-50). 4.  Aortic Atherosclerosis (ICD10-I70.0).   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      REVIEW OF SYSTEMS:   Constitutional: Denies fevers, chills or abnormal weight loss Eyes: Denies blurriness of vision Ears, nose, mouth, throat, and face: Denies mucositis or sore throat Respiratory: Denies cough, dyspnea or  wheezes Cardiovascular: Denies palpitation, chest discomfort or lower extremity swelling Skin: Denies abnormal skin rashes Lymphatics: Denies new lymphadenopathy or easy bruising Neurological:Denies numbness, tingling or new weaknesses  Behavioral/Psych: Mood is stable, no new changes  All other systems were reviewed with the patient and are negative.  I have reviewed the past medical history, past surgical history, social history and family history with the patient and they are unchanged from previous note.  ALLERGIES:  has No Known Allergies.  MEDICATIONS:  Current Outpatient Medications  Medication Sig Dispense Refill  . amLODipine (NORVASC) 10 MG tablet TAKE 1 TABLET BY MOUTH DAILY. DECREASE SIMVASTATIN TO 20MG DAILY WHILE ON AMLODIPINE PER MD 90 tablet 1  . busPIRone (BUSPAR) 10 MG tablet Take 10 mg by mouth 2 (two) times daily.    . Calcium Carb-Cholecalciferol 500-600 MG-UNIT TABS Take 1 tablet by mouth daily.     . Cholecalciferol (VITAMIN D3) 2000 units TABS Take 2,000 Units by mouth daily.     . hydrochlorothiazide (HYDRODIURIL) 50 MG tablet TAKE 1 TABLET (50 MG TOTAL) BY MOUTH DAILY. 90 tablet 11  . LENVIMA, 10 MG DAILY DOSE, capsule TAKE 1 CAPSULE (10 MG TOTAL) BY MOUTH DAILY. 30 capsule 11  . levothyroxine (SYNTHROID) 75 MCG tablet Take 1 tablet (75 mcg total) by mouth daily before breakfast. 90 tablet 2  . LORazepam (ATIVAN) 0.5 MG tablet Take 0.5 mg by mouth 2 (two) times daily as needed.  0  . losartan (COZAAR) 50 MG tablet TAKE 1 TABLET (50 MG TOTAL) BY MOUTH DAILY. 30 tablet 11  . metoprolol tartrate (LOPRESSOR) 25 MG tablet TAKE 1 TABLET (25 MG TOTAL) BY MOUTH 2 (TWO) TIMES DAILY. 180 tablet 3  . ondansetron (ZOFRAN) 8 MG tablet Take 1 tablet (8 mg total) by mouth every 8 (eight) hours as needed for nausea. 30 tablet 3  . pantoprazole (PROTONIX) 40 MG tablet Take 1 tablet (40 mg total) by mouth daily. 90 tablet 3  . simvastatin (ZOCOR) 40 MG tablet Take 40 mg by mouth  every evening.    . tacrolimus (PROTOPIC) 0.1 % ointment Apply 1 application topically daily as needed (rash).     . urea (CARMOL) 40 % CREA Apply 1 application topically daily. 85 g 1  . venlafaxine XR (EFFEXOR-XR) 75 MG 24 hr capsule Take 300 mg by mouth daily with breakfast.      No current facility-administered medications for this visit.   Facility-Administered Medications Ordered in Other Visits  Medication Dose Route Frequency Provider Last Rate Last Admin  . heparin lock flush 100 unit/mL  500 Units Intracatheter Once PRN Alvy Bimler, Karrissa Parchment, MD      . sodium chloride flush (NS) 0.9 % injection 10 mL  10 mL Intracatheter PRN Alvy Bimler, Keonia Pasko, MD        PHYSICAL EXAMINATION: ECOG PERFORMANCE STATUS: 1 - Symptomatic but completely ambulatory  Vitals:   01/15/20 0945  BP: 127/62  Pulse: 72  Resp: 16  Temp: (!) 97.5 F (36.4 C)  SpO2: 100%   Filed Weights   01/15/20 0945  Weight: 150 lb 4.8 oz (68.2 kg)    GENERAL:alert, no distress and comfortable NEURO: alert & oriented x 3 with fluent speech, no focal motor/sensory deficits  LABORATORY DATA:  I have reviewed the data as listed    Component Value Date/Time   NA 136 01/15/2020 0934   NA 141 05/17/2017 0957   K 4.3 01/15/2020 0934   K 3.6 05/17/2017 0957   CL 102 01/15/2020 0934   CL 105 11/19/2012 0848   CO2 25 01/15/2020 0934   CO2 25 05/17/2017 0957   GLUCOSE 79 01/15/2020 0934   GLUCOSE 85 05/17/2017  0957   GLUCOSE 122 (H) 11/19/2012 0848   BUN 22 01/15/2020 0934   BUN 15.2 05/17/2017 0957   CREATININE 0.92 01/15/2020 0934   CREATININE 0.83 07/10/2019 1240   CREATININE 0.8 05/17/2017 0957   CALCIUM 10.4 (H) 01/15/2020 0934   CALCIUM 9.6 05/17/2017 0957   PROT 7.4 01/15/2020 0934   PROT 7.0 05/17/2017 0957   ALBUMIN 3.7 01/15/2020 0934   ALBUMIN 3.9 05/17/2017 0957   AST 22 01/15/2020 0934   AST 23 07/10/2019 1240   AST 25 05/17/2017 0957   ALT 16 01/15/2020 0934   ALT 20 07/10/2019 1240   ALT 18 05/17/2017  0957   ALKPHOS 49 01/15/2020 0934   ALKPHOS 57 05/17/2017 0957   BILITOT 0.5 01/15/2020 0934   BILITOT 0.4 07/10/2019 1240   BILITOT 0.47 05/17/2017 0957   GFRNONAA >60 01/15/2020 0934   GFRNONAA >60 07/10/2019 1240   GFRAA >60 01/15/2020 0934   GFRAA >60 07/10/2019 1240    No results found for: SPEP, UPEP  Lab Results  Component Value Date   WBC 4.9 01/15/2020   NEUTROABS 3.5 01/15/2020   HGB 10.2 (L) 01/15/2020   HCT 31.4 (L) 01/15/2020   MCV 94.9 01/15/2020   PLT 181 01/15/2020      Chemistry      Component Value Date/Time   NA 136 01/15/2020 0934   NA 141 05/17/2017 0957   K 4.3 01/15/2020 0934   K 3.6 05/17/2017 0957   CL 102 01/15/2020 0934   CL 105 11/19/2012 0848   CO2 25 01/15/2020 0934   CO2 25 05/17/2017 0957   BUN 22 01/15/2020 0934   BUN 15.2 05/17/2017 0957   CREATININE 0.92 01/15/2020 0934   CREATININE 0.83 07/10/2019 1240   CREATININE 0.8 05/17/2017 0957   GLU 158 (H) 01/14/2015 1035      Component Value Date/Time   CALCIUM 10.4 (H) 01/15/2020 0934   CALCIUM 9.6 05/17/2017 0957   ALKPHOS 49 01/15/2020 0934   ALKPHOS 57 05/17/2017 0957   AST 22 01/15/2020 0934   AST 23 07/10/2019 1240   AST 25 05/17/2017 0957   ALT 16 01/15/2020 0934   ALT 20 07/10/2019 1240   ALT 18 05/17/2017 0957   BILITOT 0.5 01/15/2020 0934   BILITOT 0.4 07/10/2019 1240   BILITOT 0.47 05/17/2017 0957

## 2020-01-15 NOTE — Assessment & Plan Note (Signed)
She has intermittent elevated TSH I will adjust her thyroid medicine accordingly 

## 2020-01-15 NOTE — Telephone Encounter (Signed)
Called and given TSH results. Dr. Alvy Bimler sent a new Synthroid Rx to [facility] for 88 mcg. She verbalized understanding.

## 2020-01-15 NOTE — Patient Instructions (Signed)
[facility] Discharge Instructions for Patients Receiving Chemotherapy  Today you received the following chemotherapy agents Pembrolizumab (KEYTRUDA).  To help prevent nausea and vomiting after your treatment, we encourage you to take your nausea medication as prescribed.   If you develop nausea and vomiting that is not controlled by your nausea medication, call the clinic.   BELOW ARE SYMPTOMS THAT SHOULD BE REPORTED IMMEDIATELY:  *FEVER GREATER THAN 100.5 F  *CHILLS WITH OR WITHOUT FEVER  NAUSEA AND VOMITING THAT IS NOT CONTROLLED WITH YOUR NAUSEA MEDICATION  *UNUSUAL SHORTNESS OF BREATH  *UNUSUAL BRUISING OR BLEEDING  TENDERNESS IN MOUTH AND THROAT WITH OR WITHOUT PRESENCE OF ULCERS  *URINARY PROBLEMS  *BOWEL PROBLEMS  UNUSUAL RASH Items with * indicate a potential emergency and should be followed up as soon as possible.  Feel free to call the clinic should you have any questions or concerns. The clinic phone number is [phone].  Please show the CHEMO ALERT CARD at check-in to the Emergency Department and triage nurse.   

## 2020-01-15 NOTE — Assessment & Plan Note (Signed)
This is likely due to recent treatment. The patient denies recent history of bleeding such as epistaxis, hematuria or hematochezia. She is asymptomatic from the anemia. I will observe for now.   

## 2020-01-15 NOTE — Patient Instructions (Signed)

## 2020-01-15 NOTE — Assessment & Plan Note (Signed)
PET imaging study from Oct 22, 2019 showed stable disease She complained of frequent intermittent loose stool  I suspect this is side effects of either Lenvima or pembrolizumab I recommend spacing out Lenvima to every other day and reassess next visit we will continue treatment indefinitely I recommend lengthening the interval between imaging studies to another, and plan to repeat imaging study in 4-6 months, after the fall

## 2020-01-15 NOTE — Telephone Encounter (Signed)
Scheduled appts per 8/12 sch msg. Pt declined print out of AVS and stated she would refer to mychart.  

## 2020-01-16 ENCOUNTER — Telehealth: Payer: Self-pay

## 2020-01-16 MED FILL — LOSARTAN POTASSIUM 50 MG TA: 50 | 30 days supply | Qty: 30 | Fill #4

## 2020-01-16 NOTE — Telephone Encounter (Signed)
She called regarding Synthroid Rx. She was already taking 88 mcg, she was given Rx from Dr. Forde Dandy and has been taken it since March.  Told her I would call her back on Monday. She verbalzied understanding.

## 2020-01-19 ENCOUNTER — Other Ambulatory Visit: Payer: Self-pay

## 2020-01-19 MED ORDER — LEVOTHYROXINE SODIUM 100 MCG PO TABS
100.0000 ug | ORAL_TABLET | Freq: Every day | ORAL | 1 refills | Status: DC
Start: 2020-01-19 — End: 2020-02-26

## 2020-01-19 MED FILL — LEVOTHYROXINE SODIUM 100 MC: 100 | 90 days supply | Qty: 90 | Fill #0

## 2020-01-19 NOTE — Telephone Encounter (Signed)
Called and left below message. Ask her to call the office back for questions. Rx sent to pharmacy. 

## 2020-01-19 NOTE — Telephone Encounter (Signed)
She needs to be aware her Beryle Flock is causing abnormal TSH and I will be managing it in the future Please send in 100 mcg and make changes to her med list

## 2020-01-20 ENCOUNTER — Encounter: Payer: Self-pay | Admitting: Hematology and Oncology

## 2020-01-23 MED FILL — PANTOPRAZOLE SOD DR 40 MG T: 40 | 90 days supply | Qty: 90 | Fill #2

## 2020-01-28 ENCOUNTER — Ambulatory Visit: Payer: 59

## 2020-02-04 MED FILL — LENVIMA 10 MG DAILY DOSE: 10 | 30 days supply | Qty: 30 | Fill #4

## 2020-02-05 ENCOUNTER — Inpatient Hospital Stay: Payer: 59

## 2020-02-05 ENCOUNTER — Telehealth: Payer: Self-pay | Admitting: Hematology and Oncology

## 2020-02-05 ENCOUNTER — Inpatient Hospital Stay (HOSPITAL_BASED_OUTPATIENT_CLINIC_OR_DEPARTMENT_OTHER): Payer: 59 | Admitting: Hematology and Oncology

## 2020-02-05 ENCOUNTER — Encounter: Payer: Self-pay | Admitting: Hematology and Oncology

## 2020-02-05 ENCOUNTER — Other Ambulatory Visit: Payer: Self-pay

## 2020-02-05 ENCOUNTER — Inpatient Hospital Stay: Payer: 59 | Attending: Hematology and Oncology

## 2020-02-05 ENCOUNTER — Other Ambulatory Visit: Payer: Self-pay | Admitting: Hematology and Oncology

## 2020-02-05 DIAGNOSIS — C541 Malignant neoplasm of endometrium: Secondary | ICD-10-CM

## 2020-02-05 DIAGNOSIS — Z923 Personal history of irradiation: Secondary | ICD-10-CM | POA: Insufficient documentation

## 2020-02-05 DIAGNOSIS — Z5112 Encounter for antineoplastic immunotherapy: Secondary | ICD-10-CM | POA: Insufficient documentation

## 2020-02-05 DIAGNOSIS — I10 Essential (primary) hypertension: Secondary | ICD-10-CM | POA: Insufficient documentation

## 2020-02-05 DIAGNOSIS — Z79899 Other long term (current) drug therapy: Secondary | ICD-10-CM | POA: Diagnosis not present

## 2020-02-05 DIAGNOSIS — R42 Dizziness and giddiness: Secondary | ICD-10-CM | POA: Diagnosis not present

## 2020-02-05 DIAGNOSIS — D61818 Other pancytopenia: Secondary | ICD-10-CM

## 2020-02-05 DIAGNOSIS — Z299 Encounter for prophylactic measures, unspecified: Secondary | ICD-10-CM | POA: Diagnosis not present

## 2020-02-05 DIAGNOSIS — C770 Secondary and unspecified malignant neoplasm of lymph nodes of head, face and neck: Secondary | ICD-10-CM

## 2020-02-05 DIAGNOSIS — Z7189 Other specified counseling: Secondary | ICD-10-CM

## 2020-02-05 DIAGNOSIS — Z9221 Personal history of antineoplastic chemotherapy: Secondary | ICD-10-CM | POA: Diagnosis not present

## 2020-02-05 DIAGNOSIS — Z23 Encounter for immunization: Secondary | ICD-10-CM | POA: Diagnosis not present

## 2020-02-05 DIAGNOSIS — E039 Hypothyroidism, unspecified: Secondary | ICD-10-CM

## 2020-02-05 DIAGNOSIS — Z95828 Presence of other vascular implants and grafts: Secondary | ICD-10-CM

## 2020-02-05 LAB — TSH: TSH: 0.6 u[IU]/mL (ref 0.308–3.960)

## 2020-02-05 LAB — CBC WITH DIFFERENTIAL/PLATELET
Abs Immature Granulocytes: 0.01 10*3/uL (ref 0.00–0.07)
Basophils Absolute: 0.1 10*3/uL (ref 0.0–0.1)
Basophils Relative: 1 %
Eosinophils Absolute: 0.2 10*3/uL (ref 0.0–0.5)
Eosinophils Relative: 6 %
HCT: 31.2 % — ABNORMAL LOW (ref 36.0–46.0)
Hemoglobin: 10.1 g/dL — ABNORMAL LOW (ref 12.0–15.0)
Immature Granulocytes: 0 %
Lymphocytes Relative: 19 %
Lymphs Abs: 0.7 10*3/uL (ref 0.7–4.0)
MCH: 31.6 pg (ref 26.0–34.0)
MCHC: 32.4 g/dL (ref 30.0–36.0)
MCV: 97.5 fL (ref 80.0–100.0)
Monocytes Absolute: 0.3 10*3/uL (ref 0.1–1.0)
Monocytes Relative: 8 %
Neutro Abs: 2.5 10*3/uL (ref 1.7–7.7)
Neutrophils Relative %: 66 %
Platelets: 181 10*3/uL (ref 150–400)
RBC: 3.2 MIL/uL — ABNORMAL LOW (ref 3.87–5.11)
RDW: 13.4 % (ref 11.5–15.5)
WBC: 3.8 10*3/uL — ABNORMAL LOW (ref 4.0–10.5)
nRBC: 0 % (ref 0.0–0.2)

## 2020-02-05 LAB — TOTAL PROTEIN, URINE DIPSTICK: Protein, ur: NEGATIVE mg/dL

## 2020-02-05 LAB — COMPREHENSIVE METABOLIC PANEL
ALT: 20 U/L (ref 0–44)
AST: 22 U/L (ref 15–41)
Albumin: 3.7 g/dL (ref 3.5–5.0)
Alkaline Phosphatase: 51 U/L (ref 38–126)
Anion gap: 9 (ref 5–15)
BUN: 23 mg/dL (ref 8–23)
CO2: 27 mmol/L (ref 22–32)
Calcium: 10.4 mg/dL — ABNORMAL HIGH (ref 8.9–10.3)
Chloride: 103 mmol/L (ref 98–111)
Creatinine, Ser: 0.85 mg/dL (ref 0.44–1.00)
GFR calc Af Amer: >60 mL/min (ref >60)
GFR calc non Af Amer: >60 mL/min (ref >60)
Glucose, Bld: 100 mg/dL — ABNORMAL HIGH (ref 70–99)
Potassium: 4.1 mmol/L (ref 3.5–5.1)
Sodium: 139 mmol/L (ref 135–145)
Total Bilirubin: 0.5 mg/dL (ref 0.3–1.2)
Total Protein: 7.1 g/dL (ref 6.5–8.1)

## 2020-02-05 MED ORDER — SODIUM CHLORIDE 0.9 % IV SOLN
200.0000 mg | Freq: Once | INTRAVENOUS | Status: AC
  Administered 2020-02-05: 200 mg via INTRAVENOUS
  Filled 2020-02-05: qty 8

## 2020-02-05 MED ORDER — SODIUM CHLORIDE 0.9% FLUSH
10.0000 mL | Freq: Once | INTRAVENOUS | Status: AC
  Administered 2020-02-05: 10 mL
  Filled 2020-02-05: qty 10

## 2020-02-05 MED ORDER — LOSARTAN POTASSIUM 25 MG PO TABS
25.0000 mg | ORAL_TABLET | Freq: Every day | ORAL | 11 refills | Status: DC
Start: 2020-02-05 — End: 2020-07-29

## 2020-02-05 MED ORDER — HEPARIN SOD (PORK) LOCK FLUSH 100 UNIT/ML IV SOLN
500.0000 [IU] | Freq: Once | INTRAVENOUS | Status: AC | PRN
  Administered 2020-02-05: 500 [IU]
  Filled 2020-02-05: qty 5

## 2020-02-05 MED ORDER — SODIUM CHLORIDE 0.9 % IV SOLN
Freq: Once | INTRAVENOUS | Status: AC
  Filled 2020-02-05: qty 250

## 2020-02-05 MED ORDER — SODIUM CHLORIDE 0.9% FLUSH
10.0000 mL | INTRAVENOUS | Status: DC | PRN
  Administered 2020-02-05: 10 mL
  Filled 2020-02-05: qty 10

## 2020-02-05 MED FILL — LOSARTAN POTASSIUM 25 MG TA: 25 | 30 days supply | Qty: 30 | Fill #0

## 2020-02-05 NOTE — Patient Instructions (Signed)

## 2020-02-05 NOTE — Assessment & Plan Note (Signed)
This is due to side effects of treatment She is not symptomatic Observe only 

## 2020-02-05 NOTE — Progress Notes (Signed)
   Covid-19 Vaccination Clinic  Name:  Diane Lozano    MRN: [id number] DOB: 17-Jan-1956  02/05/2020  [name] was observed post Covid-19 immunization for 15 minutes without incident. She was provided with Vaccine Information Sheet and instruction to access the V-Safe system.   [name] was instructed to call 911 with any severe reactions post vaccine: Marland Kitchen Difficulty breathing  . Swelling of face and throat  . A fast heartbeat  . A bad rash all over body  . Dizziness and weakness   Immunizations Administered    Name Date Dose VIS Date Route   Pfizer COVID-19 Vaccine 02/05/2020  1:10 PM 0.3 mL 07/30/2018 Intramuscular   Manufacturer: [place]   Lot: [id number]   [place]: [id number]    3

## 2020-02-05 NOTE — Progress Notes (Signed)
[place] OFFICE PROGRESS NOTE  Patient Care Team: [name], MD as PCP - General (Family Medicine) Reynold Bowen, MD as Consulting Physician (Endocrinology)  ASSESSMENT & PLAN:  Endometrial cancer [facility]) PET imaging study from Oct 22, 2019 showed stable disease She tolerated Lenvima better with every other day dosing We will continue the same I plan to repeat PET CT scan in November  Pancytopenia, acquired [facility]) This is due to side effects of treatment She is not symptomatic Observe only  Essential hypertension She has intermittent dizziness due to changes in her blood pressure I recommend reducing losartan to 25 mg daily We will continue to monitor her blood pressure closely I will consider discontinuation of one of her blood pressure medication in her next visit if her blood pressure remains low  Preventive measure We discussed the importance of preventive care and reviewed the vaccination programs. She does not have any prior allergic reactions to Covid-19 vaccination. She agrees to proceed with booster dose of Covid-19 vaccination today and we will administer it today at the clinic.    No orders of the defined types were placed in this encounter.   All questions were answered. The patient knows to call the clinic with any problems, questions or concerns. The total time spent in the appointment was 20 minutes encounter with patients including review of chart and various tests results, discussions about plan of care and coordination of care plan   [name], MD 02/05/2020 2:27 PM  INTERVAL HISTORY: Please see below for problem oriented charting. She returns for further follow-up She is doing well She has occasional dizziness recently Once we space out her Lenvima to every other day, she tolerated that much better No new lymphadenopathy No recent infection, fever or chills  SUMMARY OF ONCOLOGIC HISTORY: Oncology History Overview Note  Foundation  One testing done 11-2015 on surgical path from 2014: MS stable TMB low 4 muts/mb ATM [id number] ERBB3 T389K E2H2 rearrangement exon 9 PPP2R1A P179R TP53 I195N    ER- APPROXIMATELY 25-35% STAINING IN NEOPLASTIC CELLS (INTERMEDIATE)  PR- APPROXIMATELY 25-35% STAINING IN NEOPLASTIC CELLS (STRONG)  Repeat biopsy 04/02/17: ER negative, Her 2 negative  Progressed on Doxil   Endometrial cancer ([place])  06/20/2012 Pathology Results   Biopsy positive for papillary serous carcinoma   06/20/2012 Genetic Testing   Foundation One testing done 11-2015 on surgical path from 2014: MS stable TMB low 4 muts/mb ATM [id number] ERBB3 T389K E2H2 rearrangement exon 9 PPP2R1A P179R TP53 I195N    06/20/2012 Initial Diagnosis   Patient presented to PCP with intermittent vaginal bleeding since ~ Oct 2013, endometrial biopsy 06-20-12 with complex endometrial hyperplasia with atypia   06/26/2012 Imaging   Thickened endometrial lining in a postmenopausal patient experiencing vaginal bleeding. In the setting of post-menopausal bleeding, endometrial sampling is indicated to exclude carcinoma. No focal myometrial abnormalities are seen.  Normal left ovary and non-visualized right ovary   07/30/2012 Surgery   Dr. Polly Cobia performed robotic hysterectomy with bilateral salpingo-oophorectomy, bilateral pelvic lymph node dissection and periaortic lymph node dissection. Intraoperatively on frozen section, the patient was noted to have a large endometrial polyp with changes within the polyp consistent for high-grade malignancy, possibly papillary serous carcinoma. There is no obvious extrauterine disease noted.     07/30/2012 Pathology Results   [phone] SUPPLEMENTAL REPORT  THE ENDOMETRIAL CARCINOMA WAS ANALYZED FOR DNA MISMATCH REPAIR PROTEINS.IMMUNOHISTOCHEMICALLY, THE NEOPLASM RETAINED NUCLEAR EXPRESSION OF 4 GENE PRODUCTS, MLH1, MSH2, MSH6, AND PMS2, INVOLVED IN DNA MISMATCH  REPAIR.POSITIVE AND NEGATIVE CONTROLS WORKED APPROPRIATELY.  PER REQUEST, AN ER AND PR ARE PERFORMED ON BLOCK 1I.  ER- APPROXIMATELY 25-35% STAINING IN NEOPLASTIC CELLS (INTERMEDIATE)  PR- APPROXIMATELY 25-35% STAINING IN NEOPLASTIC CELLS (STRONG)  ER AND PR PREDOMINANTLY SHOW STAINING IN THE SEROUS COMPONENT.  DIAGNOSIS  1. UTERUS, CERVIX WITH BILATERAL FALLOPIAN TUBES AND OVARIES,  HYSTERECTOMY AND BILATERAL SALPINGO-OOPHORECTOMY:  HIGH GRADE/POORLY DIFFERENTIATED ENDOMETRIAL ADENOCARCINOMA WITH SOLID AND SEROUS PAPILLARY COMPONENTS.  HISTOPATHOLOGIC TYPE:A VARIETY OF PATTERNS WERE PRESENT, ALL OF WHICH SHOULD BE CONSIDERED TO BE HIGH GRADE.SEROUS PAPILLARY CARCINOMA WAS SEEN OCCURRING ADJACENT TO SOLID ENDOMETRIAL CARCINOMA WITH MARKED ANAPLASIA AND GIANT CELLS. SIZE:TUMOR MEASURED AT LEAST 4.8 CM IN GREATEST HORIZONTAL DIMENSION.  GRADE: POORLY DIFFERENTIATED OR HIGH GRADE. DEPTH OF INVASION: NO DEFINITE MYOMETRIAL INVOLVEMENT WAS SEEN.THE TUMOR APPEARED CONFINED TO THE POLYP AS WELL AS SURFACE ENDOMETRIUM. WHERE MYOMETRIAL THICKNESS NOT APPLICABLE. SEROSAL INVOLVEMENT: NOT DEMONSTRATED.  ENDOCERVICAL INVOLVEMENT:NOT DEMONSTRATED. RESECTION MARGINS: FREE OF INVOLVEMENT. EXTRAUTERINE EXTENSION:NOT DEMONSTRATED. ANGIOLYMPHATIC INVASION: NOT DEFINITELY SEEN. TOTAL NODES EXAMINED:22.  PELVIC NODES EXAMINED:20.  PELVIC NODES INVOLVED:0.  PARA-AORTIC NODES 2. EXAMINED:  PARA-AORTIC NODES 0. INVOLVED TNM STAGE: T1A N0 MX. AJCC STAGE GROUPING: IA. FIGO STAGE:IA.   08/26/2012 Imaging   No CT evidence for intra-abdominal or pelvic metastatic disease. Trace free pelvic fluid, presumably postoperative although the date of surgery is not documented in the electronic medical record.   08/26/2012 - 12/13/2012 Chemotherapy   She received 6 cycles of carbo/taxol    10/29/2012 - 11/27/2012 Radiation  Therapy   May 27, June 5,  June 11, June 19, November 27, 2012: Proximal vagina 30 Gy in 5 fractions     01/17/2013 Imaging   1.  No evidence of recurrent or metastatic disease. 2.  No acute abnormality involving the abdomen or pelvis. 3.  Mild diffuse hepatic steatosis. 4.  Very small supraumbilical midline anterior abdominal wall hernia containing fat, unchanged   01/22/2014 Imaging   No evidence for metastatic or recurrent disease. 2. No bowel obstruction.  Normal appendix. 3. Small fat containing hernia, stable in appearance. 4. Status post hysterectomy and bilateral oophorectomy   02/19/2014 Imaging   No pulmonary lesions are identified. The abnormality on the chest x-ray is due to asymmetric left-sided sternoclavicular joint degenerative disease   10/12/2014 Imaging   New mild retroperitoneal lymphadenopathy in the left paraaortic region and proximal left common iliac chain, consistent with metastatic disease. No other sites of metastatic disease identified within the abdomen or pelvis.     11/27/2014 - 02/11/2015 Chemotherapy   She received 4 cycles of carbo/taxol    03/01/2015 PET scan   Single hypermetabolic small retroperitoneal lymph node along the aorta. 2. No evidence of metastatic disease otherwise in the abdomen or pelvis. No evidence local recurrence. 3. Intensely hypermetabolic enlarged nodule adjacent to the RIGHT lobe of thyroid gland. This presumably represents the biopsied lesion in clinician report which was found to be benign thyroid tissue.   04/05/2015 - 05/13/2015 Radiation Therapy   She received 50.4 gray in 28 fractions with simultaneous integrated boost to 56 gray   06/17/2015 Imaging   No acute process or evidence of metastatic disease in the abdomen or pelvis. Resolution of previously described retroperitoneal adenopathy. 2.  Possible constipation. 3. Atherosclerosis.   06/17/2015 Tumor Marker   Patient's tumor was tested for the following markers: CA125 Results of  the tumor marker test revealed 33   08/12/2015 Tumor Marker   Patient's tumor was tested for the following markers:  CA125 Results of the tumor marker test revealed 52   08/31/2015 PET scan   Development of right paratracheal hypermetabolic adenopathy, consistent with nodal metastasis. 2. The previously described isolated abdominal retroperitoneal hypermetabolic node has resolved. 3. Persistent hypermetabolic right thyroid nodule, per report previously biopsied. Correlate with those results. 4.  Possible constipation.   09/09/2015 -  Anti-estrogen oral therapy   She has been receiving alternative treatment between megace and tamoxifen   09/09/2015 Tumor Marker   Patient's tumor was tested for the following markers: CA125 Results of the tumor marker test revealed 37.8   09/20/2015 Procedure   Technically successful ultrasound-guided thyroid aspiration biopsy , dominant right nodule   09/20/2015 Pathology Results   THYROID, RIGHT, FINE NEEDLE ASPIRATION (SPECIMEN 1 OF 1 COLLECTED 09/20/15): FINDINGS CONSISTENT WITH BENIGN THYROID NODULE (BETHESDA CATEGORY II).   10/28/2015 Tumor Marker   Patient's tumor was tested for the following markers: CA125 Results of the tumor marker test revealed 53.2   11/04/2015 Imaging   Stable benign right thyroid nodule   12/16/2015 Tumor Marker   Patient's tumor was tested for the following markers: CA125 Results of the tumor marker test revealed 38.1   03/22/2016 PET scan   Interval progression of hypermetabolic right paratracheal lymph node consistent with metastatic involvement. 2. Stable hypermetabolic right thyroid nodule. Reportedly this has been biopsied in the past.   03/23/2016 Tumor Marker   Patient's tumor was tested for the following markers: CA125 Results of the tumor marker test revealed 62.4   04/13/2016 - 06/01/2016 Radiation Therapy   She received 56 Gy to the chest in 28 fractions    05/09/2016 Imaging   No evidence of left lower extremity  deep vein thrombosis. No evidence of a superficial thrombosis of the greater and lesser saphenous veins. Positive for thrombus noted in several varicosities of the calf. No evidence of Baker's cyst on the left.   05/17/2016 Tumor Marker   Patient's tumor was tested for the following markers: CA125 Results of the tumor marker test revealed 9   06/29/2016 Tumor Marker   Patient's tumor was tested for the following markers: CA125 Results of the tumor marker test revealed 5.9   08/04/2016 Tumor Marker   Patient's tumor was tested for the following markers: CA125 Results of the tumor marker test revealed 6.0   09/12/2016 PET scan   Complete metabolic response to therapy, with resolution of hypermetabolic mediastinal lymphadenopathy since prior exam. No residual or new metastatic disease identified. Stable hypermetabolic right thyroid lobe nodule, which was previously biopsied on 09/20/2015.   03/13/2017 PET scan   1. Solitary focus of recurrent right paratracheal hypermetabolic activity, with a 1.0 cm right lower paratracheal node having a maximum SUV of 9.0. Appearance compatible with recurrent malignancy. 2. Continued hypermetabolic right thyroid nodule, previously biopsied, presumed benign -correlate with prior biopsy results. 3. Other imaging findings of potential clinical significance: Aortic Atherosclerosis (ICD10-I70.0). Mild cardiomegaly. Prominent stool throughout the colon favors constipation.   04/02/2017 Pathology Results   FINE NEEDLE ASPIRATION, ENDOSCOPIC, (EBUS) 4 R NODE (SPECIMEN 1 OF 2 COLLECTED 04/02/17): MALIGNANT CELLS PRESENT, CONSISTENT WITH CARCINOMA. SEE COMMENT. COMMENT: THE MALIGNANT CELLS ARE POSITIVE FOR P53 AND NEGATIVE FOR ESTROGEN RECEPTOR AND TTF-1. THIS PROFILE IS NON-SPECIFIC, BUT THE P53 POSITIVE STAINING IS SUGGESTIVE OF GYNECOLOGIC PRIMARY.    04/11/2017 Procedure   Successful 8 French right internal jugular vein power port placement with its tip at the  SVC/RA junction   04/12/2017 Imaging   Normal LV size with  mild LV hypertrophy. EF 55-60%. Normal RV size and systolic function. Aortic valve sclerosis without significant stenosis.   04/18/2017 - 06/14/2017 Chemotherapy   The patient had chemotherapy with Doxil    07/10/2017 Imaging   - Left ventricle: The cavity size was normal. There was mild concentric hypertrophy. Systolic function was normal. The estimated ejection fraction was in the range of 55% to 60%. Wall motion was normal; there were no regional wall motion abnormalities. Doppler parameters are consistent with abnormal left ventricular relaxation (grade 1 diastolic dysfunction). - Aortic valve: Noncoronary cusp mobility was mildly restricted. - Mitral valve: There was mild regurgitation. - Left atrium: The atrium was mildly dilated. - Atrial septum: No defect or patent foramen ovale was identified.  Impressions:  - Compared to November 2018, global LV longitudinal strain remains normal (has increased)   07/11/2017 PET scan   Increased size and hypermetabolic activity of 3.3 cm right thyroid lobe nodule. Thyroid carcinoma cannot be excluded. Recommend repeat ultrasound guided fine needle aspiration to exclude thyroid carcinoma.  New adjacent hypermetabolic 10 mm right supraclavicular lymph node, suspicious for lymph node metastasis.  Slight increase in size and hypermetabolic activity of solitary right paratracheal lymph node.  No evidence of abdominal or pelvic metastatic disease.   07/18/2017 Procedure   1. Technically successful ultrasound guided fine needle aspiration of indeterminate hypermetabolic right-sided thyroid nodule/mass. 2. Technically successful ultrasound-guided core needle biopsy of hypermetabolic right lower cervical lymph node.   07/18/2017 Pathology Results   THYROID, FINE NEEDLE ASPIRATION RIGHT (SPECIMEN 1 OF 1, COLLECTED ON 07/18/2017): ATYPIA OF UNDETERMINED SIGNIFICANCE OR FOLLICULAR LESION OF  UNDETERMINED SIGNIFICANCE (BETHESDA CATEGORY III). SEE COMMENT. COMMENT: THE SPECIMEN CONSISTS OF SMALL AND MEDIUM SIZED GROUPS OF FOLLICULAR EPITHELIAL CELLS WITH MILD CYTOLOGIC ATYPIA INCLUDING NUCLEAR ENLARGEMENT AND HURTHLE CELL CHANGE. SOME GROUPS ARE ARRANGED AS MICROFOLLICLES. THERE IS MINIMAL BACKGROUND COLLOID. BASED ON THESE FEATURES, A FOLLICULAR LESION/NEOPLASM CAN NOT BE ENTIRELY RULED OUT. A SPECIMEN WILL BE SENT FOR AFIRMA TESTING.   07/19/2017 Pathology Results   Lymph node, needle/core biopsy - METASTATIC PAPILLARY SEROUS CARCINOMA. - SEE COMMENT. Microscopic Comment Dr. Vicente Males has reviewed the case and concurs with this interpretation   10/15/2017 PET scan   No new or progressive disease. No evidence of abdominal or pelvic metastatic disease.  Stable hypermetabolic right thyroid lobe nodule and adjacent right supraclavicular lymph node.  Decreased size and hypermetabolic activity of solitary right paratracheal lymph node.   01/23/2018 PET scan   1. Overall, no significant change from PET-CT of 3 months ago. There is persistent hypermetabolic activity at the right thoracic inlet and within a right paratracheal mediastinal node. The nodes have not significantly changed in size, although the metabolic activity has minimally increased over this interval. It is uncertain how much of the thoracic inlet metabolic activity is attributed to the thyroid nodule versus adjacent cervical lymph nodes, both previously biopsied. 2. No new hypermetabolic activity within the neck, chest, abdomen or pelvis. No evidence of local recurrence in the pelvis. 3. Stable probable radiation changes in the right lung.   04/16/2018 PET scan   1. Interval increase in metabolic activity of the RIGHT supraclavicular node and RIGHT lower paratracheal mediastinal node with minimal change in size. Findings consistent with persistent and mildly progressed metastatic adenopathy. 2. New hypermetabolic small  lymph node in the RIGHT lower paratracheal nodal station adjacent to the previously followed node. 3. No evidence of local recurrence in the pelvis or new disease elsewhere.   07/30/2018  PET scan   1. Mild interval progression of right supraclavicular, mediastinal, and right hilar hypermetabolic metastatic disease. There is a new hypermetabolic lymph node in the subcarinal station on today's study. 2. No hypermetabolic disease in the abdomen or pelvis to suggest recurrent/metastases. 3.  Aortic Atherosclerois (ICD10-170.0)    08/16/2018 -  Chemotherapy   The patient had Lenvima since 3/6 and pembrolizumab since 3/13 for chemotherapy treatment. Michel Santee was held temporarily due to infection and severe hypertension   10/31/2018 PET scan   Partial response to therapy, with decreased hypermetabolic lymphadenopathy in mediastinum and right hilar region.  No significant change in hypermetabolic lymphadenopathy in right inferior neck.  No new or progressive metastatic disease identified. No evidence of recurrent or metastatic carcinoma in the pelvis or abdomen   02/04/2019 PET scan   1. Decrease in metabolic activity of RIGHT supraclavicular node and RIGHT paratracheal lymph nodes. 2. Persistent activity in the RIGHT thyroid nodule unchanged. 3. Diffuse activity through the esophagus is favored esophagitis. No change. 4. Scattered foci of intense metabolic activity associated the bowel without focal lesion on CT favored benign. 5. No evidence of peritoneal metastasis. 6. No evidence of metastatic adenopathy in the abdomen pelvis. 7. No evidence of local pelvic sidewall recurrence.   07/30/2019 PET scan   1. Mildly increased uptake within small nodes along the right sternocleidomastoid without change in size and associated with increased deltoid activity may reflect changes related to right arm injection or recent COVID vaccination, consider clinical correlation and attention on follow-up. 2.  Persistent activity in the right thyroid nodule, unchanged from previous exams. 3. New activity in a juxta esophageal lymph node in the chest is suspicious though the node is unchanged with respect to size. Endoscopic correlation could potentially be helpful or close attention on follow-up. 4. Subtle increased FDG uptake along the left mainstem bronchus and adjacent to the aorta may represent a small lymph node. No discrete correlate is demonstrated on today's study.   10/22/2019 PET scan   1. Persistent FDG avid subcentimeter right supraclavicular and juxta esophageal lymph nodes. The degree of FDG uptake is similar to the previous exam. No new or progressive findings identified. 2. No findings of solid organ metastasis, skeletal metastasis or metastatic disease to the abdomen or pelvis. 3. FDG avid right lobe of thyroid gland nodule is again noted This nodule is not incidental and been worked up previously with 2 biopsies. Please refer to results from the most recent biopsy dated 07/18/2017. (Ref: J Am Coll Radiol. 2015 Feb;12(2): 143-50). 4.  Aortic Atherosclerosis (ICD10-I70.0).   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      REVIEW OF SYSTEMS:   Constitutional: Denies fevers, chills or abnormal weight loss Eyes: Denies blurriness of vision Ears, nose, mouth, throat, and face: Denies mucositis or sore throat Respiratory: Denies cough, dyspnea or wheezes Cardiovascular: Denies palpitation, chest discomfort or lower extremity  swelling Gastrointestinal:  Denies nausea, heartburn or change in bowel habits Skin: Denies abnormal skin rashes Lymphatics: Denies new lymphadenopathy or easy bruising Neurological:Denies numbness, tingling or new weaknesses Behavioral/Psych:  Mood is stable, no new changes  All other systems were reviewed with the patient and are negative.  I have reviewed the past medical history, past surgical history, social history and family history with the patient and they are unchanged from previous note.  ALLERGIES:  has No Known Allergies.  MEDICATIONS:  Current Outpatient Medications  Medication Sig Dispense Refill  . amLODipine (NORVASC) 10 MG tablet TAKE 1 TABLET BY MOUTH DAILY. DECREASE SIMVASTATIN TO 20MG DAILY WHILE ON AMLODIPINE PER MD 90 tablet 1  . busPIRone (BUSPAR) 10 MG tablet Take 10 mg by mouth 2 (two) times daily.    . Calcium Carb-Cholecalciferol 500-600 MG-UNIT TABS Take 1 tablet by mouth daily.     . Cholecalciferol (VITAMIN D3) 2000 units TABS Take 2,000 Units by mouth daily.     . hydrochlorothiazide (HYDRODIURIL) 50 MG tablet TAKE 1 TABLET (50 MG TOTAL) BY MOUTH DAILY. 90 tablet 11  . LENVIMA, 10 MG DAILY DOSE, capsule TAKE 1 CAPSULE (10 MG TOTAL) BY MOUTH DAILY. 30 capsule 11  . levothyroxine (SYNTHROID) 100 MCG tablet Take 1 tablet (100 mcg total) by mouth daily before breakfast. 90 tablet 1  . LORazepam (ATIVAN) 0.5 MG tablet Take 0.5 mg by mouth 2 (two) times daily as needed.  0  . losartan (COZAAR) 25 MG tablet Take 1 tablet (25 mg total) by mouth daily. 30 tablet 11  . metoprolol tartrate (LOPRESSOR) 25 MG tablet TAKE 1 TABLET (25 MG TOTAL) BY MOUTH 2 (TWO) TIMES DAILY. 180 tablet 3  . ondansetron (ZOFRAN) 8 MG tablet Take 1 tablet (8 mg total) by mouth every 8 (eight) hours as needed for nausea. 30 tablet 3  . pantoprazole (PROTONIX) 40 MG tablet Take 1 tablet (40 mg total) by mouth daily. 90 tablet 3  . simvastatin (ZOCOR) 40 MG tablet Take 40 mg by mouth every  evening.    . tacrolimus (PROTOPIC) 0.1 % ointment Apply 1 application topically daily as needed (rash).     . urea (CARMOL) 40 % CREA Apply 1 application topically daily. 85 g 1  . venlafaxine XR (EFFEXOR-XR) 75 MG 24 hr capsule Take 300 mg by mouth daily with breakfast.      No current facility-administered medications for this visit.   Facility-Administered Medications Ordered in Other Visits  Medication Dose Route Frequency Provider Last Rate Last Admin  . sodium chloride flush (NS) 0.9 % injection 10 mL  10 mL Intracatheter PRN Alvy Bimler, Laquitta Dominski, MD   10 mL at 02/05/20 1317    PHYSICAL EXAMINATION: ECOG PERFORMANCE STATUS: 1 - Symptomatic but completely ambulatory  Vitals:   02/05/20 1118  BP: 114/63  Pulse: 73  Resp: 18  Temp: (!) 97.3 F (36.3 C)  SpO2: 100%   Filed Weights   02/05/20 1118  Weight: 155 lb 12.8 oz (70.7 kg)    GENERAL:alert, no distress and comfortable SKIN: skin color, texture, turgor are normal, no rashes or significant lesions EYES: normal, Conjunctiva are pink and non-injected, sclera clear OROPHARYNX:no exudate, no erythema and lips, buccal mucosa, and tongue normal  NECK: supple, thyroid normal size, non-tender, without nodularity LYMPH:  no palpable lymphadenopathy in the cervical, axillary or inguinal LUNGS: clear to auscultation and percussion with normal breathing effort HEART: regular rate & rhythm and no murmurs and no lower extremity edema ABDOMEN:abdomen soft, non-tender and normal bowel sounds Musculoskeletal:no cyanosis of digits and no clubbing  NEURO: alert & oriented x 3 with fluent speech, no focal motor/sensory deficits  LABORATORY DATA:  I have reviewed the data as listed    Component Value Date/Time   NA 139 02/05/2020 1111   NA 141 05/17/2017 0957   K 4.1 02/05/2020 1111   K 3.6 05/17/2017 0957   CL 103 02/05/2020 1111   CL 105 11/19/2012 0848   CO2 27 02/05/2020 1111   CO2 25 05/17/2017 0957   GLUCOSE 100 (H) 02/05/2020  1111   GLUCOSE 85 05/17/2017 0957   GLUCOSE 122 (H) 11/19/2012 0848   BUN 23 02/05/2020 1111   BUN 15.2 05/17/2017 0957   CREATININE 0.85 02/05/2020 1111   CREATININE 0.83 07/10/2019 1240   CREATININE 0.8 05/17/2017 0957   CALCIUM 10.4 (H) 02/05/2020 1111   CALCIUM 9.6 05/17/2017 0957   PROT 7.1 02/05/2020 1111   PROT 7.0 05/17/2017 0957   ALBUMIN 3.7 02/05/2020 1111   ALBUMIN 3.9 05/17/2017 0957   AST 22 02/05/2020 1111   AST 23 07/10/2019 1240   AST 25 05/17/2017 0957   ALT 20 02/05/2020 1111   ALT 20 07/10/2019 1240   ALT 18 05/17/2017 0957   ALKPHOS 51 02/05/2020 1111   ALKPHOS 57 05/17/2017 0957   BILITOT 0.5 02/05/2020 1111   BILITOT 0.4 07/10/2019 1240   BILITOT 0.47 05/17/2017 0957   GFRNONAA >60 02/05/2020 1111   GFRNONAA >60 07/10/2019 1240   GFRAA >60 02/05/2020 1111   GFRAA >60 07/10/2019 1240    No results found for: SPEP, UPEP  Lab Results  Component Value Date   WBC 3.8 (L) 02/05/2020   NEUTROABS 2.5 02/05/2020   HGB 10.1 (L) 02/05/2020   HCT 31.2 (L) 02/05/2020   MCV 97.5 02/05/2020   PLT 181 02/05/2020      Chemistry      Component Value Date/Time   NA 139 02/05/2020 1111   NA 141 05/17/2017 0957   K 4.1 02/05/2020 1111   K 3.6 05/17/2017 0957   CL 103 02/05/2020 1111   CL 105 11/19/2012 0848   CO2 27 02/05/2020 1111   CO2 25 05/17/2017 0957   BUN 23 02/05/2020 1111   BUN 15.2 05/17/2017 0957   CREATININE 0.85 02/05/2020 1111   CREATININE 0.83 07/10/2019 1240   CREATININE 0.8 05/17/2017 0957   GLU 158 (H) 01/14/2015 1035      Component Value Date/Time   CALCIUM 10.4 (H) 02/05/2020 1111   CALCIUM 9.6 05/17/2017 0957   ALKPHOS 51 02/05/2020 1111   ALKPHOS 57 05/17/2017 0957   AST 22 02/05/2020 1111   AST 23 07/10/2019 1240   AST 25 05/17/2017 0957   ALT 20 02/05/2020 1111   ALT 20 07/10/2019 1240   ALT 18 05/17/2017 0957   BILITOT 0.5 02/05/2020 1111   BILITOT 0.4 07/10/2019 1240   BILITOT 0.47 05/17/2017 0957

## 2020-02-05 NOTE — Telephone Encounter (Signed)
Scheduled appts per 9/2 sch msg. Pt confirmed appt date and time.

## 2020-02-05 NOTE — Assessment & Plan Note (Signed)
She has intermittent dizziness due to changes in her blood pressure I recommend reducing losartan to 25 mg daily We will continue to monitor her blood pressure closely I will consider discontinuation of one of her blood pressure medication in her next visit if her blood pressure remains low

## 2020-02-05 NOTE — Assessment & Plan Note (Signed)
PET imaging study from Oct 22, 2019 showed stable disease She tolerated Lenvima better with every other day dosing We will continue the same I plan to repeat PET CT scan in November

## 2020-02-05 NOTE — Assessment & Plan Note (Signed)
We discussed the importance of preventive care and reviewed the vaccination programs. She does not have any prior allergic reactions to Covid-19 vaccination. She agrees to proceed with booster dose of Covid-19 vaccination today and we will administer it today at the clinic.  

## 2020-02-20 DIAGNOSIS — K219 Gastro-esophageal reflux disease without esophagitis: Secondary | ICD-10-CM | POA: Diagnosis not present

## 2020-02-20 DIAGNOSIS — E785 Hyperlipidemia, unspecified: Secondary | ICD-10-CM | POA: Diagnosis not present

## 2020-02-20 DIAGNOSIS — M8580 Other specified disorders of bone density and structure, unspecified site: Secondary | ICD-10-CM | POA: Diagnosis not present

## 2020-02-20 DIAGNOSIS — I10 Essential (primary) hypertension: Secondary | ICD-10-CM | POA: Diagnosis not present

## 2020-02-20 DIAGNOSIS — I700 Atherosclerosis of aorta: Secondary | ICD-10-CM | POA: Diagnosis not present

## 2020-02-20 DIAGNOSIS — D61810 Antineoplastic chemotherapy induced pancytopenia: Secondary | ICD-10-CM | POA: Diagnosis not present

## 2020-02-20 DIAGNOSIS — C541 Malignant neoplasm of endometrium: Secondary | ICD-10-CM | POA: Diagnosis not present

## 2020-02-20 DIAGNOSIS — D649 Anemia, unspecified: Secondary | ICD-10-CM | POA: Diagnosis not present

## 2020-02-20 DIAGNOSIS — E041 Nontoxic single thyroid nodule: Secondary | ICD-10-CM | POA: Diagnosis not present

## 2020-02-25 MED FILL — HYDROCHLOROTHIAZIDE 50 MG T: 50 | 90 days supply | Qty: 90 | Fill #4

## 2020-02-26 ENCOUNTER — Inpatient Hospital Stay: Payer: 59

## 2020-02-26 ENCOUNTER — Telehealth: Payer: Self-pay

## 2020-02-26 ENCOUNTER — Other Ambulatory Visit: Payer: Self-pay

## 2020-02-26 ENCOUNTER — Other Ambulatory Visit: Payer: Self-pay | Admitting: Hematology and Oncology

## 2020-02-26 VITALS — BP 129/69 | HR 63 | Temp 98.1°F | Resp 16

## 2020-02-26 DIAGNOSIS — Z79899 Other long term (current) drug therapy: Secondary | ICD-10-CM | POA: Diagnosis not present

## 2020-02-26 DIAGNOSIS — Z9221 Personal history of antineoplastic chemotherapy: Secondary | ICD-10-CM | POA: Diagnosis not present

## 2020-02-26 DIAGNOSIS — Z95828 Presence of other vascular implants and grafts: Secondary | ICD-10-CM

## 2020-02-26 DIAGNOSIS — C541 Malignant neoplasm of endometrium: Secondary | ICD-10-CM | POA: Diagnosis not present

## 2020-02-26 DIAGNOSIS — Z923 Personal history of irradiation: Secondary | ICD-10-CM | POA: Diagnosis not present

## 2020-02-26 DIAGNOSIS — E039 Hypothyroidism, unspecified: Secondary | ICD-10-CM

## 2020-02-26 DIAGNOSIS — Z5112 Encounter for antineoplastic immunotherapy: Secondary | ICD-10-CM | POA: Diagnosis not present

## 2020-02-26 DIAGNOSIS — I10 Essential (primary) hypertension: Secondary | ICD-10-CM | POA: Diagnosis not present

## 2020-02-26 DIAGNOSIS — D61818 Other pancytopenia: Secondary | ICD-10-CM | POA: Diagnosis not present

## 2020-02-26 DIAGNOSIS — R42 Dizziness and giddiness: Secondary | ICD-10-CM | POA: Diagnosis not present

## 2020-02-26 DIAGNOSIS — Z7189 Other specified counseling: Secondary | ICD-10-CM

## 2020-02-26 DIAGNOSIS — C770 Secondary and unspecified malignant neoplasm of lymph nodes of head, face and neck: Secondary | ICD-10-CM

## 2020-02-26 DIAGNOSIS — Z23 Encounter for immunization: Secondary | ICD-10-CM | POA: Diagnosis not present

## 2020-02-26 LAB — COMPREHENSIVE METABOLIC PANEL
ALT: 15 U/L (ref 0–44)
AST: 20 U/L (ref 15–41)
Albumin: 3.7 g/dL (ref 3.5–5.0)
Alkaline Phosphatase: 50 U/L (ref 38–126)
Anion gap: 5 (ref 5–15)
BUN: 20 mg/dL (ref 8–23)
CO2: 31 mmol/L (ref 22–32)
Calcium: 9.6 mg/dL (ref 8.9–10.3)
Chloride: 101 mmol/L (ref 98–111)
Creatinine, Ser: 0.88 mg/dL (ref 0.44–1.00)
GFR calc Af Amer: >60 mL/min (ref >60)
GFR calc non Af Amer: >60 mL/min (ref >60)
Glucose, Bld: 87 mg/dL (ref 70–99)
Potassium: 3.8 mmol/L (ref 3.5–5.1)
Sodium: 137 mmol/L (ref 135–145)
Total Bilirubin: 0.4 mg/dL (ref 0.3–1.2)
Total Protein: 7 g/dL (ref 6.5–8.1)

## 2020-02-26 LAB — CBC WITH DIFFERENTIAL/PLATELET
Abs Immature Granulocytes: 0.01 10*3/uL (ref 0.00–0.07)
Basophils Absolute: 0 10*3/uL (ref 0.0–0.1)
Basophils Relative: 1 %
Eosinophils Absolute: 0.2 10*3/uL (ref 0.0–0.5)
Eosinophils Relative: 6 %
HCT: 29.2 % — ABNORMAL LOW (ref 36.0–46.0)
Hemoglobin: 9.5 g/dL — ABNORMAL LOW (ref 12.0–15.0)
Immature Granulocytes: 0 %
Lymphocytes Relative: 20 %
Lymphs Abs: 0.6 10*3/uL — ABNORMAL LOW (ref 0.7–4.0)
MCH: 31.1 pg (ref 26.0–34.0)
MCHC: 32.5 g/dL (ref 30.0–36.0)
MCV: 95.7 fL (ref 80.0–100.0)
Monocytes Absolute: 0.3 10*3/uL (ref 0.1–1.0)
Monocytes Relative: 10 %
Neutro Abs: 2 10*3/uL (ref 1.7–7.7)
Neutrophils Relative %: 63 %
Platelets: 145 10*3/uL — ABNORMAL LOW (ref 150–400)
RBC: 3.05 MIL/uL — ABNORMAL LOW (ref 3.87–5.11)
RDW: 13 % (ref 11.5–15.5)
WBC: 3.2 10*3/uL — ABNORMAL LOW (ref 4.0–10.5)
nRBC: 0 % (ref 0.0–0.2)

## 2020-02-26 LAB — TSH: TSH: 0.099 u[IU]/mL — ABNORMAL LOW (ref 0.308–3.960)

## 2020-02-26 MED ORDER — HEPARIN SOD (PORK) LOCK FLUSH 100 UNIT/ML IV SOLN
500.0000 [IU] | Freq: Once | INTRAVENOUS | Status: AC | PRN
  Administered 2020-02-26: 500 [IU]
  Filled 2020-02-26: qty 5

## 2020-02-26 MED ORDER — SODIUM CHLORIDE 0.9% FLUSH
10.0000 mL | Freq: Once | INTRAVENOUS | Status: AC
  Administered 2020-02-26: 10 mL
  Filled 2020-02-26: qty 10

## 2020-02-26 MED ORDER — SODIUM CHLORIDE 0.9 % IV SOLN
Freq: Once | INTRAVENOUS | Status: AC
  Filled 2020-02-26: qty 250

## 2020-02-26 MED ORDER — SODIUM CHLORIDE 0.9 % IV SOLN
200.0000 mg | Freq: Once | INTRAVENOUS | Status: AC
  Administered 2020-02-26: 200 mg via INTRAVENOUS
  Filled 2020-02-26: qty 8

## 2020-02-26 MED ORDER — SODIUM CHLORIDE 0.9% FLUSH
10.0000 mL | INTRAVENOUS | Status: DC | PRN
  Administered 2020-02-26: 10 mL
  Filled 2020-02-26: qty 10

## 2020-02-26 MED ORDER — LEVOTHYROXINE SODIUM 75 MCG PO TABS: 75.0000 ug | ORAL_TABLET | Freq: Every day | ORAL | Status: DC

## 2020-02-26 NOTE — Patient Instructions (Signed)

## 2020-02-26 NOTE — Patient Instructions (Signed)
[facility] Discharge Instructions for Patients Receiving Chemotherapy  Today you received the following chemotherapy agents:  Keytruda.  To help prevent nausea and vomiting after your treatment, we encourage you to take your nausea medication as directed.   If you develop nausea and vomiting that is not controlled by your nausea medication, call the clinic.   BELOW ARE SYMPTOMS THAT SHOULD BE REPORTED IMMEDIATELY:  *FEVER GREATER THAN 100.5 F  *CHILLS WITH OR WITHOUT FEVER  NAUSEA AND VOMITING THAT IS NOT CONTROLLED WITH YOUR NAUSEA MEDICATION  *UNUSUAL SHORTNESS OF BREATH  *UNUSUAL BRUISING OR BLEEDING  TENDERNESS IN MOUTH AND THROAT WITH OR WITHOUT PRESENCE OF ULCERS  *URINARY PROBLEMS  *BOWEL PROBLEMS  UNUSUAL RASH Items with * indicate a potential emergency and should be followed up as soon as possible.  Feel free to call the clinic should you have any questions or concerns. The clinic phone number is [phone].  Please show the CHEMO ALERT CARD at check-in to the Emergency Department and triage nurse.    

## 2020-02-26 NOTE — Telephone Encounter (Signed)
-----   Message from [name], MD sent at 02/26/2020 11:30 AM EDT ----- Regarding: TSH is low Pls call her and tell her to cut back synthroid to 75 mcg Let me know if she needs medication refill or whether she has 75 mcg at home

## 2020-02-26 NOTE — Telephone Encounter (Signed)
Called and left below message. Ask for a call back to the office. 

## 2020-02-26 NOTE — Telephone Encounter (Signed)
She called back. She has 75 mcg Rx's and does not need a refill.

## 2020-02-27 MED FILL — busPIRone HCL 10 MG TABS: 10 | 90 days supply | Qty: 180 | Fill #0

## 2020-03-10 ENCOUNTER — Other Ambulatory Visit: Payer: Self-pay | Admitting: Hematology and Oncology

## 2020-03-10 MED FILL — LIDOCAINE-PRILOCAINE CREAM: 2.5-2.5 | 30 days supply | Qty: 30 | Fill #0

## 2020-03-10 MED FILL — SIMVASTATIN 20 MG TABLET: 20 | 90 days supply | Qty: 90 | Fill #2

## 2020-03-16 ENCOUNTER — Other Ambulatory Visit: Payer: Self-pay | Admitting: Hematology and Oncology

## 2020-03-17 ENCOUNTER — Other Ambulatory Visit: Payer: Self-pay | Admitting: Hematology and Oncology

## 2020-03-17 MED FILL — AMLODIPINE BESYLATE 10 MG T: 10 | 90 days supply | Qty: 90 | Fill #0

## 2020-03-18 ENCOUNTER — Other Ambulatory Visit: Payer: Self-pay

## 2020-03-18 ENCOUNTER — Encounter: Payer: Self-pay | Admitting: Hematology and Oncology

## 2020-03-18 ENCOUNTER — Inpatient Hospital Stay: Payer: 59 | Attending: Hematology and Oncology

## 2020-03-18 ENCOUNTER — Inpatient Hospital Stay (HOSPITAL_BASED_OUTPATIENT_CLINIC_OR_DEPARTMENT_OTHER): Payer: 59 | Admitting: Hematology and Oncology

## 2020-03-18 ENCOUNTER — Inpatient Hospital Stay: Payer: 59

## 2020-03-18 ENCOUNTER — Other Ambulatory Visit (HOSPITAL_COMMUNITY): Payer: Self-pay | Admitting: Hematology and Oncology

## 2020-03-18 VITALS — BP 103/66 | HR 68 | Temp 97.4°F | Resp 18 | Ht 63.0 in | Wt 157.0 lb

## 2020-03-18 DIAGNOSIS — R946 Abnormal results of thyroid function studies: Secondary | ICD-10-CM

## 2020-03-18 DIAGNOSIS — Z9221 Personal history of antineoplastic chemotherapy: Secondary | ICD-10-CM | POA: Insufficient documentation

## 2020-03-18 DIAGNOSIS — E039 Hypothyroidism, unspecified: Secondary | ICD-10-CM

## 2020-03-18 DIAGNOSIS — I10 Essential (primary) hypertension: Secondary | ICD-10-CM | POA: Insufficient documentation

## 2020-03-18 DIAGNOSIS — Z923 Personal history of irradiation: Secondary | ICD-10-CM | POA: Insufficient documentation

## 2020-03-18 DIAGNOSIS — Z5112 Encounter for antineoplastic immunotherapy: Secondary | ICD-10-CM | POA: Diagnosis not present

## 2020-03-18 DIAGNOSIS — Z299 Encounter for prophylactic measures, unspecified: Secondary | ICD-10-CM | POA: Diagnosis not present

## 2020-03-18 DIAGNOSIS — C770 Secondary and unspecified malignant neoplasm of lymph nodes of head, face and neck: Secondary | ICD-10-CM | POA: Insufficient documentation

## 2020-03-18 DIAGNOSIS — Z79899 Other long term (current) drug therapy: Secondary | ICD-10-CM | POA: Diagnosis not present

## 2020-03-18 DIAGNOSIS — D61818 Other pancytopenia: Secondary | ICD-10-CM | POA: Insufficient documentation

## 2020-03-18 DIAGNOSIS — C541 Malignant neoplasm of endometrium: Secondary | ICD-10-CM

## 2020-03-18 DIAGNOSIS — G35 Multiple sclerosis: Secondary | ICD-10-CM | POA: Diagnosis not present

## 2020-03-18 DIAGNOSIS — Z90722 Acquired absence of ovaries, bilateral: Secondary | ICD-10-CM | POA: Insufficient documentation

## 2020-03-18 DIAGNOSIS — Z7189 Other specified counseling: Secondary | ICD-10-CM

## 2020-03-18 LAB — CBC WITH DIFFERENTIAL/PLATELET
Abs Immature Granulocytes: 0 10*3/uL (ref 0.00–0.07)
Basophils Absolute: 0.1 10*3/uL (ref 0.0–0.1)
Basophils Relative: 1 %
Eosinophils Absolute: 0.2 10*3/uL (ref 0.0–0.5)
Eosinophils Relative: 5 %
HCT: 31.3 % — ABNORMAL LOW (ref 36.0–46.0)
Hemoglobin: 10.1 g/dL — ABNORMAL LOW (ref 12.0–15.0)
Immature Granulocytes: 0 %
Lymphocytes Relative: 17 %
Lymphs Abs: 0.7 10*3/uL (ref 0.7–4.0)
MCH: 31.4 pg (ref 26.0–34.0)
MCHC: 32.3 g/dL (ref 30.0–36.0)
MCV: 97.2 fL (ref 80.0–100.0)
Monocytes Absolute: 0.3 10*3/uL (ref 0.1–1.0)
Monocytes Relative: 8 %
Neutro Abs: 2.7 10*3/uL (ref 1.7–7.7)
Neutrophils Relative %: 69 %
Platelets: 186 10*3/uL (ref 150–400)
RBC: 3.22 MIL/uL — ABNORMAL LOW (ref 3.87–5.11)
RDW: 13.2 % (ref 11.5–15.5)
WBC: 3.9 10*3/uL — ABNORMAL LOW (ref 4.0–10.5)
nRBC: 0 % (ref 0.0–0.2)

## 2020-03-18 LAB — COMPREHENSIVE METABOLIC PANEL
ALT: 11 U/L (ref 0–44)
AST: 17 U/L (ref 15–41)
Albumin: 3.7 g/dL (ref 3.5–5.0)
Alkaline Phosphatase: 47 U/L (ref 38–126)
Anion gap: 4 — ABNORMAL LOW (ref 5–15)
BUN: 24 mg/dL — ABNORMAL HIGH (ref 8–23)
CO2: 32 mmol/L (ref 22–32)
Calcium: 9.7 mg/dL (ref 8.9–10.3)
Chloride: 102 mmol/L (ref 98–111)
Creatinine, Ser: 0.93 mg/dL (ref 0.44–1.00)
GFR, Estimated: >60 mL/min (ref >60)
Glucose, Bld: 82 mg/dL (ref 70–99)
Potassium: 3.9 mmol/L (ref 3.5–5.1)
Sodium: 138 mmol/L (ref 135–145)
Total Bilirubin: 0.4 mg/dL (ref 0.3–1.2)
Total Protein: 7.1 g/dL (ref 6.5–8.1)

## 2020-03-18 LAB — TOTAL PROTEIN, URINE DIPSTICK: Protein, ur: NEGATIVE mg/dL

## 2020-03-18 LAB — TSH: TSH: 0.687 u[IU]/mL (ref 0.308–3.960)

## 2020-03-18 MED ORDER — HEPARIN SOD (PORK) LOCK FLUSH 100 UNIT/ML IV SOLN
500.0000 [IU] | Freq: Once | INTRAVENOUS | Status: AC | PRN
  Administered 2020-03-18: 500 [IU]
  Filled 2020-03-18: qty 5

## 2020-03-18 MED ORDER — SODIUM CHLORIDE 0.9% FLUSH
10.0000 mL | INTRAVENOUS | Status: DC | PRN
  Administered 2020-03-18: 10 mL
  Filled 2020-03-18: qty 10

## 2020-03-18 MED ORDER — SODIUM CHLORIDE 0.9 % IV SOLN
Freq: Once | INTRAVENOUS | Status: AC
  Filled 2020-03-18: qty 250

## 2020-03-18 MED ORDER — SODIUM CHLORIDE 0.9 % IV SOLN
200.0000 mg | Freq: Once | INTRAVENOUS | Status: AC
  Administered 2020-03-18: 200 mg via INTRAVENOUS
  Filled 2020-03-18: qty 8

## 2020-03-18 NOTE — Assessment & Plan Note (Signed)
She is due for influenza vaccination Unfortunately, we ran out of flu shot I recommend she gets a flu shot from work

## 2020-03-18 NOTE — Patient Instructions (Signed)
[facility] Discharge Instructions for Patients Receiving Chemotherapy  Today you received the following chemotherapy agents:  Keytruda.  To help prevent nausea and vomiting after your treatment, we encourage you to take your nausea medication as directed.   If you develop nausea and vomiting that is not controlled by your nausea medication, call the clinic.   BELOW ARE SYMPTOMS THAT SHOULD BE REPORTED IMMEDIATELY:  *FEVER GREATER THAN 100.5 F  *CHILLS WITH OR WITHOUT FEVER  NAUSEA AND VOMITING THAT IS NOT CONTROLLED WITH YOUR NAUSEA MEDICATION  *UNUSUAL SHORTNESS OF BREATH  *UNUSUAL BRUISING OR BLEEDING  TENDERNESS IN MOUTH AND THROAT WITH OR WITHOUT PRESENCE OF ULCERS  *URINARY PROBLEMS  *BOWEL PROBLEMS  UNUSUAL RASH Items with * indicate a potential emergency and should be followed up as soon as possible.  Feel free to call the clinic should you have any questions or concerns. The clinic phone number is [phone].  Please show the CHEMO ALERT CARD at check-in to the Emergency Department and triage nurse.    

## 2020-03-18 NOTE — Assessment & Plan Note (Signed)
She has intermittent elevated TSH I will adjust her thyroid medicine accordingly 

## 2020-03-18 NOTE — Progress Notes (Signed)
[place] OFFICE PROGRESS NOTE  Patient Care Team: [name], MD as PCP - General (Family Medicine) Reynold Bowen, MD as Consulting Physician (Endocrinology)  ASSESSMENT & PLAN:  Endometrial cancer [facility]) PET imaging study from Oct 22, 2019 showed stable disease She tolerated Lenvima better with every other day dosing We will continue the same I plan to repeat PET CT scan on the first week of December  Pancytopenia, acquired [facility]) This is due to side effects of treatment She is not symptomatic Observe only  Essential hypertension Her blood pressure is still quite low I recommend she reduce her hydrochlorothiazide from 50 mg to 25 mg and then finish the prescription and not refill that  Acquired hypothyroidism She has intermittent elevated TSH I will adjust her thyroid medicine accordingly  Preventive measure She is due for influenza vaccination Unfortunately, we ran out of flu shot I recommend she gets a flu shot from work   Orders Placed This Encounter  Procedures  . NM PET Image Restage (PS) Skull Base to Thigh    Standing Status:   Future    Standing Expiration Date:   03/18/2021    Order Specific Question:   If indicated for the ordered procedure, I authorize the administration of a radiopharmaceutical per Radiology protocol    Answer:   Yes    Order Specific Question:   Preferred imaging location?    Answer:   [facility]    Order Specific Question:   Radiology Contrast Protocol - do NOT remove file path    Answer:   [email or web address]    All questions were answered. The patient knows to call the clinic with any problems, questions or concerns. The total time spent in the appointment was 30 minutes encounter with patients including review of chart and various tests results, discussions about plan of care and coordination of care plan   [name], MD 03/18/2020 11:29 AM  INTERVAL  HISTORY: Please see below for problem oriented charting. She returns for treatment and follow-up She tolerated recent treatment well Her blood pressure remained low No new lymphadenopathy No recent infection, fever or chills She denies recent infusion reaction  SUMMARY OF ONCOLOGIC HISTORY: Oncology History Overview Note  [employer] testing done 11-2015 on surgical path from 2014: MS stable TMB low 4 muts/mb ATM [id number] ERBB3 T389K E2H2 rearrangement exon 9 PPP2R1A P179R TP53 I195N    ER- APPROXIMATELY 25-35% STAINING IN NEOPLASTIC CELLS (INTERMEDIATE)  PR- APPROXIMATELY 25-35% STAINING IN NEOPLASTIC CELLS (STRONG)  Repeat biopsy 04/02/17: ER negative, Her 2 negative  Progressed on Doxil   Endometrial cancer ([place])  06/20/2012 Pathology Results   Biopsy positive for papillary serous carcinoma   06/20/2012 Genetic Testing   [employer] One testing done 11-2015 on surgical path from 2014: MS stable TMB low 4 muts/mb ATM [id number] ERBB3 T389K E2H2 rearrangement exon 9 PPP2R1A P179R TP53 I195N    06/20/2012 Initial Diagnosis   Patient presented to PCP with intermittent vaginal bleeding since ~ Oct 2013, endometrial biopsy 06-20-12 with complex endometrial hyperplasia with atypia   06/26/2012 Imaging   Thickened endometrial lining in a postmenopausal patient experiencing vaginal bleeding. In the setting of post-menopausal bleeding, endometrial sampling is indicated to exclude carcinoma. No focal myometrial abnormalities are seen.  Normal left ovary and non-visualized right ovary   07/30/2012 Surgery   Dr. Polly Cobia performed robotic hysterectomy with bilateral salpingo-oophorectomy, bilateral pelvic lymph node dissection and periaortic lymph node dissection. Intraoperatively on frozen section, the patient was noted to  have a large endometrial polyp with changes within the polyp consistent for high-grade malignancy, possibly papillary serous carcinoma.  There is no obvious extrauterine disease noted.     07/30/2012 Pathology Results   [phone] SUPPLEMENTAL REPORT  THE ENDOMETRIAL CARCINOMA WAS ANALYZED FOR DNA MISMATCH REPAIR PROTEINS.IMMUNOHISTOCHEMICALLY, THE NEOPLASM RETAINED NUCLEAR EXPRESSION OF 4 GENE PRODUCTS, MLH1, MSH2, MSH6, AND PMS2, INVOLVED IN DNA MISMATCH REPAIR.POSITIVE AND NEGATIVE CONTROLS WORKED APPROPRIATELY.  PER REQUEST, AN ER AND PR ARE PERFORMED ON BLOCK 1I.  ER- APPROXIMATELY 25-35% STAINING IN NEOPLASTIC CELLS (INTERMEDIATE)  PR- APPROXIMATELY 25-35% STAINING IN NEOPLASTIC CELLS (STRONG)  ER AND PR PREDOMINANTLY SHOW STAINING IN THE SEROUS COMPONENT.  DIAGNOSIS  1. UTERUS, CERVIX WITH BILATERAL FALLOPIAN TUBES AND OVARIES,  HYSTERECTOMY AND BILATERAL SALPINGO-OOPHORECTOMY:  HIGH GRADE/POORLY DIFFERENTIATED ENDOMETRIAL ADENOCARCINOMA WITH SOLID AND SEROUS PAPILLARY COMPONENTS.  HISTOPATHOLOGIC TYPE:A VARIETY OF PATTERNS WERE PRESENT, ALL OF WHICH SHOULD BE CONSIDERED TO BE HIGH GRADE.SEROUS PAPILLARY CARCINOMA WAS SEEN OCCURRING ADJACENT TO SOLID ENDOMETRIAL CARCINOMA WITH MARKED ANAPLASIA AND GIANT CELLS. SIZE:TUMOR MEASURED AT LEAST 4.8 CM IN GREATEST HORIZONTAL DIMENSION.  GRADE: POORLY DIFFERENTIATED OR HIGH GRADE. DEPTH OF INVASION: NO DEFINITE MYOMETRIAL INVOLVEMENT WAS SEEN.THE TUMOR APPEARED CONFINED TO THE POLYP AS WELL AS SURFACE ENDOMETRIUM. WHERE MYOMETRIAL THICKNESS NOT APPLICABLE. SEROSAL INVOLVEMENT: NOT DEMONSTRATED.  ENDOCERVICAL INVOLVEMENT:NOT DEMONSTRATED. RESECTION MARGINS: FREE OF INVOLVEMENT. EXTRAUTERINE EXTENSION:NOT DEMONSTRATED. ANGIOLYMPHATIC INVASION: NOT DEFINITELY SEEN. TOTAL NODES EXAMINED:22.  PELVIC NODES EXAMINED:20.  PELVIC NODES INVOLVED:0.  PARA-AORTIC NODES 2. EXAMINED:  PARA-AORTIC NODES 0. INVOLVED TNM STAGE: T1A N0 MX. AJCC STAGE GROUPING: IA. FIGO STAGE:IA.    08/26/2012 Imaging   No CT evidence for intra-abdominal or pelvic metastatic disease. Trace free pelvic fluid, presumably postoperative although the date of surgery is not documented in the electronic medical record.   08/26/2012 - 12/13/2012 Chemotherapy   She received 6 cycles of carbo/taxol    10/29/2012 - 11/27/2012 Radiation Therapy   May 27, June 5,  June 11, June 19, November 27, 2012: Proximal vagina 30 Gy in 5 fractions     01/17/2013 Imaging   1.  No evidence of recurrent or metastatic disease. 2.  No acute abnormality involving the abdomen or pelvis. 3.  Mild diffuse hepatic steatosis. 4.  Very small supraumbilical midline anterior abdominal wall hernia containing fat, unchanged   01/22/2014 Imaging   No evidence for metastatic or recurrent disease. 2. No bowel obstruction.  Normal appendix. 3. Small fat containing hernia, stable in appearance. 4. Status post hysterectomy and bilateral oophorectomy   02/19/2014 Imaging   No pulmonary lesions are identified. The abnormality on the chest x-ray is due to asymmetric left-sided sternoclavicular joint degenerative disease   10/12/2014 Imaging   New mild retroperitoneal lymphadenopathy in the left paraaortic region and proximal left common iliac chain, consistent with metastatic disease. No other sites of metastatic disease identified within the abdomen or pelvis.     11/27/2014 - 02/11/2015 Chemotherapy   She received 4 cycles of carbo/taxol    03/01/2015 PET scan   Single hypermetabolic small retroperitoneal lymph node along the aorta. 2. No evidence of metastatic disease otherwise in the abdomen or pelvis. No evidence local recurrence. 3. Intensely hypermetabolic enlarged nodule adjacent to the RIGHT lobe of thyroid gland. This presumably represents the biopsied lesion in clinician report which was found to be benign thyroid tissue.   04/05/2015 - 05/13/2015 Radiation Therapy   She received 50.4 gray in 28 fractions with simultaneous  integrated boost to 56 gray   06/17/2015  Imaging   No acute process or evidence of metastatic disease in the abdomen or pelvis. Resolution of previously described retroperitoneal adenopathy. 2.  Possible constipation. 3. Atherosclerosis.   06/17/2015 Tumor Marker   Patient's tumor was tested for the following markers: CA125 Results of the tumor marker test revealed 33   08/12/2015 Tumor Marker   Patient's tumor was tested for the following markers: CA125 Results of the tumor marker test revealed 52   08/31/2015 PET scan   Development of right paratracheal hypermetabolic adenopathy, consistent with nodal metastasis. 2. The previously described isolated abdominal retroperitoneal hypermetabolic node has resolved. 3. Persistent hypermetabolic right thyroid nodule, per report previously biopsied. Correlate with those results. 4.  Possible constipation.   09/09/2015 -  Anti-estrogen oral therapy   She has been receiving alternative treatment between megace and tamoxifen   09/09/2015 Tumor Marker   Patient's tumor was tested for the following markers: CA125 Results of the tumor marker test revealed 37.8   09/20/2015 Procedure   Technically successful ultrasound-guided thyroid aspiration biopsy , dominant right nodule   09/20/2015 Pathology Results   THYROID, RIGHT, FINE NEEDLE ASPIRATION (SPECIMEN 1 OF 1 COLLECTED 09/20/15): FINDINGS CONSISTENT WITH BENIGN THYROID NODULE (BETHESDA CATEGORY II).   10/28/2015 Tumor Marker   Patient's tumor was tested for the following markers: CA125 Results of the tumor marker test revealed 53.2   11/04/2015 Imaging   Stable benign right thyroid nodule   12/16/2015 Tumor Marker   Patient's tumor was tested for the following markers: CA125 Results of the tumor marker test revealed 38.1   03/22/2016 PET scan   Interval progression of hypermetabolic right paratracheal lymph node consistent with metastatic involvement. 2. Stable hypermetabolic right thyroid nodule.  Reportedly this has been biopsied in the past.   03/23/2016 Tumor Marker   Patient's tumor was tested for the following markers: CA125 Results of the tumor marker test revealed 62.4   04/13/2016 - 06/01/2016 Radiation Therapy   She received 56 Gy to the chest in 28 fractions    05/09/2016 Imaging   No evidence of left lower extremity deep vein thrombosis. No evidence of a superficial thrombosis of the greater and lesser saphenous veins. Positive for thrombus noted in several varicosities of the calf. No evidence of Baker's cyst on the left.   05/17/2016 Tumor Marker   Patient's tumor was tested for the following markers: CA125 Results of the tumor marker test revealed 9   06/29/2016 Tumor Marker   Patient's tumor was tested for the following markers: CA125 Results of the tumor marker test revealed 5.9   08/04/2016 Tumor Marker   Patient's tumor was tested for the following markers: CA125 Results of the tumor marker test revealed 6.0   09/12/2016 PET scan   Complete metabolic response to therapy, with resolution of hypermetabolic mediastinal lymphadenopathy since prior exam. No residual or new metastatic disease identified. Stable hypermetabolic right thyroid lobe nodule, which was previously biopsied on 09/20/2015.   03/13/2017 PET scan   1. Solitary focus of recurrent right paratracheal hypermetabolic activity, with a 1.0 cm right lower paratracheal node having a maximum SUV of 9.0. Appearance compatible with recurrent malignancy. 2. Continued hypermetabolic right thyroid nodule, previously biopsied, presumed benign -correlate with prior biopsy results. 3. Other imaging findings of potential clinical significance: Aortic Atherosclerosis (ICD10-I70.0). Mild cardiomegaly. Prominent stool throughout the colon favors constipation.   04/02/2017 Pathology Results   FINE NEEDLE ASPIRATION, ENDOSCOPIC, (EBUS) 4 R NODE (SPECIMEN 1 OF 2 COLLECTED 04/02/17): MALIGNANT CELLS PRESENT, CONSISTENT WITH  CARCINOMA. SEE COMMENT. COMMENT: THE MALIGNANT CELLS ARE POSITIVE FOR P53 AND NEGATIVE FOR ESTROGEN RECEPTOR AND TTF-1. THIS PROFILE IS NON-SPECIFIC, BUT THE P53 POSITIVE STAINING IS SUGGESTIVE OF GYNECOLOGIC PRIMARY.    04/11/2017 Procedure   Successful 8 French right internal jugular vein power port placement with its tip at the SVC/RA junction   04/12/2017 Imaging   Normal LV size with mild LV hypertrophy. EF 55-60%. Normal RV size and systolic function. Aortic valve sclerosis without significant stenosis.   04/18/2017 - 06/14/2017 Chemotherapy   The patient had chemotherapy with Doxil    07/10/2017 Imaging   - Left ventricle: The cavity size was normal. There was mild concentric hypertrophy. Systolic function was normal. The estimated ejection fraction was in the range of 55% to 60%. Wall motion was normal; there were no regional wall motion abnormalities. Doppler parameters are consistent with abnormal left ventricular relaxation (grade 1 diastolic dysfunction). - Aortic valve: Noncoronary cusp mobility was mildly restricted. - Mitral valve: There was mild regurgitation. - Left atrium: The atrium was mildly dilated. - Atrial septum: No defect or patent foramen ovale was identified.  Impressions:  - Compared to November 2018, global LV longitudinal strain remains normal (has increased)   07/11/2017 PET scan   Increased size and hypermetabolic activity of 3.3 cm right thyroid lobe nodule. Thyroid carcinoma cannot be excluded. Recommend repeat ultrasound guided fine needle aspiration to exclude thyroid carcinoma.  New adjacent hypermetabolic 10 mm right supraclavicular lymph node, suspicious for lymph node metastasis.  Slight increase in size and hypermetabolic activity of solitary right paratracheal lymph node.  No evidence of abdominal or pelvic metastatic disease.   07/18/2017 Procedure   1. Technically successful ultrasound guided fine needle aspiration of indeterminate  hypermetabolic right-sided thyroid nodule/mass. 2. Technically successful ultrasound-guided core needle biopsy of hypermetabolic right lower cervical lymph node.   07/18/2017 Pathology Results   THYROID, FINE NEEDLE ASPIRATION RIGHT (SPECIMEN 1 OF 1, COLLECTED ON 07/18/2017): ATYPIA OF UNDETERMINED SIGNIFICANCE OR FOLLICULAR LESION OF UNDETERMINED SIGNIFICANCE (BETHESDA CATEGORY III). SEE COMMENT. COMMENT: THE SPECIMEN CONSISTS OF SMALL AND MEDIUM SIZED GROUPS OF FOLLICULAR EPITHELIAL CELLS WITH MILD CYTOLOGIC ATYPIA INCLUDING NUCLEAR ENLARGEMENT AND HURTHLE CELL CHANGE. SOME GROUPS ARE ARRANGED AS MICROFOLLICLES. THERE IS MINIMAL BACKGROUND COLLOID. BASED ON THESE FEATURES, A FOLLICULAR LESION/NEOPLASM CAN NOT BE ENTIRELY RULED OUT. A SPECIMEN WILL BE SENT FOR AFIRMA TESTING.   07/19/2017 Pathology Results   Lymph node, needle/core biopsy - METASTATIC PAPILLARY SEROUS CARCINOMA. - SEE COMMENT. Microscopic Comment Dr. Vicente Males has reviewed the case and concurs with this interpretation   10/15/2017 PET scan   No new or progressive disease. No evidence of abdominal or pelvic metastatic disease.  Stable hypermetabolic right thyroid lobe nodule and adjacent right supraclavicular lymph node.  Decreased size and hypermetabolic activity of solitary right paratracheal lymph node.   01/23/2018 PET scan   1. Overall, no significant change from PET-CT of 3 months ago. There is persistent hypermetabolic activity at the right thoracic inlet and within a right paratracheal mediastinal node. The nodes have not significantly changed in size, although the metabolic activity has minimally increased over this interval. It is uncertain how much of the thoracic inlet metabolic activity is attributed to the thyroid nodule versus adjacent cervical lymph nodes, both previously biopsied. 2. No new hypermetabolic activity within the neck, chest, abdomen or pelvis. No evidence of local recurrence in the pelvis. 3.  Stable probable radiation changes in the right lung.   04/16/2018 PET scan  1. Interval increase in metabolic activity of the RIGHT supraclavicular node and RIGHT lower paratracheal mediastinal node with minimal change in size. Findings consistent with persistent and mildly progressed metastatic adenopathy. 2. New hypermetabolic small lymph node in the RIGHT lower paratracheal nodal station adjacent to the previously followed node. 3. No evidence of local recurrence in the pelvis or new disease elsewhere.   07/30/2018 PET scan   1. Mild interval progression of right supraclavicular, mediastinal, and right hilar hypermetabolic metastatic disease. There is a new hypermetabolic lymph node in the subcarinal station on today's study. 2. No hypermetabolic disease in the abdomen or pelvis to suggest recurrent/metastases. 3.  Aortic Atherosclerois (ICD10-170.0)    08/16/2018 -  Chemotherapy   The patient had Lenvima since 3/6 and pembrolizumab since 3/13 for chemotherapy treatment. Michel Santee was held temporarily due to infection and severe hypertension   10/31/2018 PET scan   Partial response to therapy, with decreased hypermetabolic lymphadenopathy in mediastinum and right hilar region.  No significant change in hypermetabolic lymphadenopathy in right inferior neck.  No new or progressive metastatic disease identified. No evidence of recurrent or metastatic carcinoma in the pelvis or abdomen   02/04/2019 PET scan   1. Decrease in metabolic activity of RIGHT supraclavicular node and RIGHT paratracheal lymph nodes. 2. Persistent activity in the RIGHT thyroid nodule unchanged. 3. Diffuse activity through the esophagus is favored esophagitis. No change. 4. Scattered foci of intense metabolic activity associated the bowel without focal lesion on CT favored benign. 5. No evidence of peritoneal metastasis. 6. No evidence of metastatic adenopathy in the abdomen pelvis. 7. No evidence of local pelvic  sidewall recurrence.   07/30/2019 PET scan   1. Mildly increased uptake within small nodes along the right sternocleidomastoid without change in size and associated with increased deltoid activity may reflect changes related to right arm injection or recent COVID vaccination, consider clinical correlation and attention on follow-up. 2. Persistent activity in the right thyroid nodule, unchanged from previous exams. 3. New activity in a juxta esophageal lymph node in the chest is suspicious though the node is unchanged with respect to size. Endoscopic correlation could potentially be helpful or close attention on follow-up. 4. Subtle increased FDG uptake along the left mainstem bronchus and adjacent to the aorta may represent a small lymph node. No discrete correlate is demonstrated on today's study.   10/22/2019 PET scan   1. Persistent FDG avid subcentimeter right supraclavicular and juxta esophageal lymph nodes. The degree of FDG uptake is similar to the previous exam. No new or progressive findings identified. 2. No findings of solid organ metastasis, skeletal metastasis or metastatic disease to the abdomen or pelvis. 3. FDG avid right lobe of thyroid gland nodule is again noted This nodule is not incidental and been worked up previously with 2 biopsies. Please refer to results from the most recent biopsy dated 07/18/2017. (Ref: J Am Coll Radiol. 2015 Feb;12(2): 143-50). 4.  Aortic Atherosclerosis (ICD10-I70.0).   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[place])   08/16/2018 -  Chemotherapy   The patient had pembrolizumab (KEYTRUDA) 200 mg in sodium chloride 0.9 % 50 mL chemo infusion, 200 mg, Intravenous, Once, 27 of 28 cycles Administration: 200 mg (08/16/2018), 200 mg (09/06/2018), 200 mg (09/30/2018), 200 mg (10/21/2018), 200 mg (11/11/2018), 200 mg (12/02/2018), 200 mg (12/23/2018), 200 mg (01/13/2019), 200 mg (02/05/2019), 200  mg (02/27/2019), 200 mg (03/20/2019), 200 mg (04/10/2019), 200 mg (05/08/2019), 200  mg (05/29/2019), 200 mg (06/19/2019), 200 mg (07/10/2019), 200 mg (07/31/2019), 200 mg (08/21/2019), 200 mg (09/11/2019), 200 mg (10/02/2019), 200 mg (10/23/2019), 200 mg (11/13/2019), 200 mg (12/04/2019), 200 mg (12/25/2019), 200 mg (01/15/2020), 200 mg (02/05/2020), 200 mg (02/26/2020)  for chemotherapy treatment.      REVIEW OF SYSTEMS:   Constitutional: Denies fevers, chills or abnormal weight loss Eyes: Denies blurriness of vision Ears, nose, mouth, throat, and face: Denies mucositis or sore throat Respiratory: Denies cough, dyspnea or wheezes Cardiovascular: Denies palpitation, chest discomfort or lower extremity swelling Gastrointestinal:  Denies nausea, heartburn or change in bowel habits Skin: Denies abnormal skin rashes Lymphatics: Denies new lymphadenopathy or easy bruising Neurological:Denies numbness, tingling or new weaknesses Behavioral/Psych: Mood is stable, no new changes  All other systems were reviewed with the patient and are negative.  I have reviewed the past medical history, past surgical history, social history and family history with the patient and they are unchanged from previous note.  ALLERGIES:  has No Known Allergies.  MEDICATIONS:  Current Outpatient Medications  Medication Sig Dispense Refill  . amLODipine (NORVASC) 10 MG tablet TAKE 1 TABLET BY MOUTH ONCE DAILY. **DECREASE SIMVASTATIN TO 20 MG DAILY WHILE ON AMLODIPINE PER MD** 90 tablet 1  . busPIRone (BUSPAR) 10 MG tablet Take 10 mg by mouth 2 (two) times daily.    . Calcium Carb-Cholecalciferol 500-600 MG-UNIT TABS Take 1 tablet by mouth daily.     . Cholecalciferol (VITAMIN D3) 2000 units TABS Take 2,000 Units by mouth daily.     Marland Kitchen LENVIMA, 10 MG DAILY DOSE, capsule TAKE 1 CAPSULE (10 MG TOTAL) BY MOUTH DAILY. 30 capsule 11  . levothyroxine (SYNTHROID) 75 MCG tablet Take 1 tablet (75 mcg total) by mouth daily before breakfast.    .  lidocaine-prilocaine (EMLA) cream APPLY TO AFFECTED AREA ONCE AS DIRECTED 30 g 3  . LORazepam (ATIVAN) 0.5 MG tablet Take 0.5 mg by mouth 2 (two) times daily as needed.  0  . losartan (COZAAR) 25 MG tablet Take 1 tablet (25 mg total) by mouth daily. 30 tablet 11  . metoprolol tartrate (LOPRESSOR) 25 MG tablet TAKE 1 TABLET (25 MG TOTAL) BY MOUTH 2 (TWO) TIMES DAILY. 180 tablet 3  . ondansetron (ZOFRAN) 8 MG tablet Take 1 tablet (8 mg total) by mouth every 8 (eight) hours as needed for nausea. 30 tablet 3  . pantoprazole (PROTONIX) 40 MG tablet Take 1 tablet (40 mg total) by mouth daily. 90 tablet 3  . simvastatin (ZOCOR) 40 MG tablet Take 40 mg by mouth every evening.    . tacrolimus (PROTOPIC) 0.1 % ointment Apply 1 application topically daily as needed (rash).     . urea (CARMOL) 40 % CREA Apply 1 application topically daily. 85 g 1  . venlafaxine XR (EFFEXOR-XR) 75 MG 24 hr capsule Take 300 mg by mouth daily with breakfast.      No current facility-administered medications for this visit.    PHYSICAL EXAMINATION: ECOG PERFORMANCE STATUS: 0 - Asymptomatic  Vitals:   03/18/20 1055  BP: 103/66  Pulse: 68  Resp: 18  Temp: (!) 97.4 F (36.3 C)  SpO2: 99%   Filed Weights   03/18/20 1055  Weight: 157 lb (71.2 kg)    GENERAL:alert, no distress and comfortable SKIN: skin color, texture, turgor are normal, no rashes or significant lesions EYES: normal, Conjunctiva are pink and non-injected, sclera clear OROPHARYNX:no exudate, no erythema and lips, buccal mucosa, and tongue normal  NECK: supple, thyroid  normal size, non-tender, without nodularity LYMPH:  no palpable lymphadenopathy in the cervical, axillary or inguinal LUNGS: clear to auscultation and percussion with normal breathing effort HEART: regular rate & rhythm and no murmurs and no lower extremity edema ABDOMEN:abdomen soft, non-tender and normal bowel sounds Musculoskeletal:no cyanosis of digits and no clubbing  NEURO:  alert & oriented x 3 with fluent speech, no focal motor/sensory deficits  LABORATORY DATA:  I have reviewed the data as listed    Component Value Date/Time   NA 137 02/26/2020 0940   NA 141 05/17/2017 0957   K 3.8 02/26/2020 0940   K 3.6 05/17/2017 0957   CL 101 02/26/2020 0940   CL 105 11/19/2012 0848   CO2 31 02/26/2020 0940   CO2 25 05/17/2017 0957   GLUCOSE 87 02/26/2020 0940   GLUCOSE 85 05/17/2017 0957   GLUCOSE 122 (H) 11/19/2012 0848   BUN 20 02/26/2020 0940   BUN 15.2 05/17/2017 0957   CREATININE 0.88 02/26/2020 0940   CREATININE 0.83 07/10/2019 1240   CREATININE 0.8 05/17/2017 0957   CALCIUM 9.6 02/26/2020 0940   CALCIUM 9.6 05/17/2017 0957   PROT 7.0 02/26/2020 0940   PROT 7.0 05/17/2017 0957   ALBUMIN 3.7 02/26/2020 0940   ALBUMIN 3.9 05/17/2017 0957   AST 20 02/26/2020 0940   AST 23 07/10/2019 1240   AST 25 05/17/2017 0957   ALT 15 02/26/2020 0940   ALT 20 07/10/2019 1240   ALT 18 05/17/2017 0957   ALKPHOS 50 02/26/2020 0940   ALKPHOS 57 05/17/2017 0957   BILITOT 0.4 02/26/2020 0940   BILITOT 0.4 07/10/2019 1240   BILITOT 0.47 05/17/2017 0957   GFRNONAA >60 02/26/2020 0940   GFRNONAA >60 07/10/2019 1240   GFRAA >60 02/26/2020 0940   GFRAA >60 07/10/2019 1240    No results found for: SPEP, UPEP  Lab Results  Component Value Date   WBC 3.9 (L) 03/18/2020   NEUTROABS 2.7 03/18/2020   HGB 10.1 (L) 03/18/2020   HCT 31.3 (L) 03/18/2020   MCV 97.2 03/18/2020   PLT 186 03/18/2020      Chemistry      Component Value Date/Time   NA 137 02/26/2020 0940   NA 141 05/17/2017 0957   K 3.8 02/26/2020 0940   K 3.6 05/17/2017 0957   CL 101 02/26/2020 0940   CL 105 11/19/2012 0848   CO2 31 02/26/2020 0940   CO2 25 05/17/2017 0957   BUN 20 02/26/2020 0940   BUN 15.2 05/17/2017 0957   CREATININE 0.88 02/26/2020 0940   CREATININE 0.83 07/10/2019 1240   CREATININE 0.8 05/17/2017 0957   GLU 158 (H) 01/14/2015 1035      Component Value Date/Time    CALCIUM 9.6 02/26/2020 0940   CALCIUM 9.6 05/17/2017 0957   ALKPHOS 50 02/26/2020 0940   ALKPHOS 57 05/17/2017 0957   AST 20 02/26/2020 0940   AST 23 07/10/2019 1240   AST 25 05/17/2017 0957   ALT 15 02/26/2020 0940   ALT 20 07/10/2019 1240   ALT 18 05/17/2017 0957   BILITOT 0.4 02/26/2020 0940   BILITOT 0.4 07/10/2019 1240   BILITOT 0.47 05/17/2017 0957

## 2020-03-18 NOTE — Assessment & Plan Note (Signed)
PET imaging study from Oct 22, 2019 showed stable disease She tolerated Lenvima better with every other day dosing We will continue the same I plan to repeat PET CT scan on the first week of December

## 2020-03-18 NOTE — Assessment & Plan Note (Signed)
This is due to side effects of treatment She is not symptomatic Observe only 

## 2020-03-18 NOTE — Assessment & Plan Note (Signed)
Her blood pressure is still quite low I recommend she reduce her hydrochlorothiazide from 50 mg to 25 mg and then finish the prescription and not refill that

## 2020-03-26 MED FILL — METOPROLOL TARTRATE 25 MG T: 25 | 90 days supply | Qty: 180 | Fill #1

## 2020-03-26 MED FILL — LEVOTHYROXINE 75 MCG TABLET: 75 | 90 days supply | Qty: 90 | Fill #1

## 2020-04-01 MED FILL — LENVIMA 10 MG DAILY DOSE: 10 | 30 days supply | Qty: 15 | Fill #0

## 2020-04-02 ENCOUNTER — Encounter: Payer: Self-pay | Admitting: Hematology and Oncology

## 2020-04-07 MED FILL — VENLAFAXINE HCL ER 150 MG C: 150 | 90 days supply | Qty: 180 | Fill #1

## 2020-04-08 ENCOUNTER — Inpatient Hospital Stay: Payer: 59

## 2020-04-08 ENCOUNTER — Other Ambulatory Visit: Payer: Self-pay

## 2020-04-08 ENCOUNTER — Inpatient Hospital Stay: Payer: 59 | Attending: Hematology and Oncology

## 2020-04-08 VITALS — BP 121/83 | HR 66 | Temp 98.6°F | Resp 16

## 2020-04-08 DIAGNOSIS — C541 Malignant neoplasm of endometrium: Secondary | ICD-10-CM

## 2020-04-08 DIAGNOSIS — Z95828 Presence of other vascular implants and grafts: Secondary | ICD-10-CM

## 2020-04-08 DIAGNOSIS — Z5112 Encounter for antineoplastic immunotherapy: Secondary | ICD-10-CM | POA: Insufficient documentation

## 2020-04-08 DIAGNOSIS — E039 Hypothyroidism, unspecified: Secondary | ICD-10-CM

## 2020-04-08 DIAGNOSIS — C770 Secondary and unspecified malignant neoplasm of lymph nodes of head, face and neck: Secondary | ICD-10-CM | POA: Diagnosis not present

## 2020-04-08 DIAGNOSIS — I10 Essential (primary) hypertension: Secondary | ICD-10-CM

## 2020-04-08 DIAGNOSIS — Z7189 Other specified counseling: Secondary | ICD-10-CM

## 2020-04-08 LAB — CBC WITH DIFFERENTIAL/PLATELET
Abs Immature Granulocytes: 0.01 10*3/uL (ref 0.00–0.07)
Basophils Absolute: 0 10*3/uL (ref 0.0–0.1)
Basophils Relative: 1 %
Eosinophils Absolute: 0.2 10*3/uL (ref 0.0–0.5)
Eosinophils Relative: 6 %
HCT: 30.1 % — ABNORMAL LOW (ref 36.0–46.0)
Hemoglobin: 9.5 g/dL — ABNORMAL LOW (ref 12.0–15.0)
Immature Granulocytes: 0 %
Lymphocytes Relative: 23 %
Lymphs Abs: 0.8 10*3/uL (ref 0.7–4.0)
MCH: 30.7 pg (ref 26.0–34.0)
MCHC: 31.6 g/dL (ref 30.0–36.0)
MCV: 97.4 fL (ref 80.0–100.0)
Monocytes Absolute: 0.3 10*3/uL (ref 0.1–1.0)
Monocytes Relative: 8 %
Neutro Abs: 2.1 10*3/uL (ref 1.7–7.7)
Neutrophils Relative %: 62 %
Platelets: 161 10*3/uL (ref 150–400)
RBC: 3.09 MIL/uL — ABNORMAL LOW (ref 3.87–5.11)
RDW: 13.4 % (ref 11.5–15.5)
WBC: 3.3 10*3/uL — ABNORMAL LOW (ref 4.0–10.5)
nRBC: 0 % (ref 0.0–0.2)

## 2020-04-08 LAB — COMPREHENSIVE METABOLIC PANEL
ALT: 15 U/L (ref 0–44)
AST: 22 U/L (ref 15–41)
Albumin: 3.6 g/dL (ref 3.5–5.0)
Alkaline Phosphatase: 54 U/L (ref 38–126)
Anion gap: 8 (ref 5–15)
BUN: 24 mg/dL — ABNORMAL HIGH (ref 8–23)
CO2: 26 mmol/L (ref 22–32)
Calcium: 9.4 mg/dL (ref 8.9–10.3)
Chloride: 107 mmol/L (ref 98–111)
Creatinine, Ser: 0.91 mg/dL (ref 0.44–1.00)
GFR, Estimated: >60 mL/min (ref >60)
Glucose, Bld: 99 mg/dL (ref 70–99)
Potassium: 4.4 mmol/L (ref 3.5–5.1)
Sodium: 141 mmol/L (ref 135–145)
Total Bilirubin: 0.5 mg/dL (ref 0.3–1.2)
Total Protein: 6.8 g/dL (ref 6.5–8.1)

## 2020-04-08 LAB — TSH: TSH: 1.285 u[IU]/mL (ref 0.308–3.960)

## 2020-04-08 LAB — TOTAL PROTEIN, URINE DIPSTICK

## 2020-04-08 MED ORDER — SODIUM CHLORIDE 0.9% FLUSH
10.0000 mL | Freq: Once | INTRAVENOUS | Status: AC
  Administered 2020-04-08: 10 mL
  Filled 2020-04-08: qty 10

## 2020-04-08 MED ORDER — SODIUM CHLORIDE 0.9% FLUSH
10.0000 mL | INTRAVENOUS | Status: DC | PRN
  Administered 2020-04-08: 10 mL
  Filled 2020-04-08: qty 10

## 2020-04-08 MED ORDER — SODIUM CHLORIDE 0.9 % IV SOLN
Freq: Once | INTRAVENOUS | Status: AC
  Filled 2020-04-08: qty 250

## 2020-04-08 MED ORDER — SODIUM CHLORIDE 0.9 % IV SOLN
200.0000 mg | Freq: Once | INTRAVENOUS | Status: AC
  Administered 2020-04-08: 200 mg via INTRAVENOUS
  Filled 2020-04-08: qty 8

## 2020-04-08 MED ORDER — HEPARIN SOD (PORK) LOCK FLUSH 100 UNIT/ML IV SOLN
500.0000 [IU] | Freq: Once | INTRAVENOUS | Status: AC | PRN
  Administered 2020-04-08: 500 [IU]
  Filled 2020-04-08: qty 5

## 2020-04-08 NOTE — Patient Instructions (Signed)

## 2020-04-08 NOTE — Patient Instructions (Signed)
[facility] Discharge Instructions for Patients Receiving Chemotherapy  Today you received the following chemotherapy agents keytruda  To help prevent nausea and vomiting after your treatment, we encourage you to take your nausea medication as directed If you develop nausea and vomiting that is not controlled by your nausea medication, call the clinic.   BELOW ARE SYMPTOMS THAT SHOULD BE REPORTED IMMEDIATELY:  *FEVER GREATER THAN 100.5 F  *CHILLS WITH OR WITHOUT FEVER  NAUSEA AND VOMITING THAT IS NOT CONTROLLED WITH YOUR NAUSEA MEDICATION  *UNUSUAL SHORTNESS OF BREATH  *UNUSUAL BRUISING OR BLEEDING  TENDERNESS IN MOUTH AND THROAT WITH OR WITHOUT PRESENCE OF ULCERS  *URINARY PROBLEMS  *BOWEL PROBLEMS  UNUSUAL RASH Items with * indicate a potential emergency and should be followed up as soon as possible.  Feel free to call the clinic should you have any questions or concerns. The clinic phone number is [phone].  Please show the CHEMO ALERT CARD at check-in to the Emergency Department and triage nurse.   

## 2020-04-15 MED FILL — PANTOPRAZOLE SOD DR 40 MG T: 40 | 90 days supply | Qty: 90 | Fill #3

## 2020-04-15 MED FILL — LOSARTAN POTASSIUM 25 MG TA: 25 | 30 days supply | Qty: 30 | Fill #1

## 2020-05-05 ENCOUNTER — Ambulatory Visit (HOSPITAL_COMMUNITY)
Admission: RE | Admit: 2020-05-05 | Discharge: 2020-05-05 | Disposition: A | Discharge status: Home / Self Care | Payer: 59 | Source: Ambulatory Visit | Attending: Hematology and Oncology | Admitting: Hematology and Oncology

## 2020-05-05 ENCOUNTER — Other Ambulatory Visit: Payer: Self-pay

## 2020-05-05 DIAGNOSIS — Z90710 Acquired absence of both cervix and uterus: Secondary | ICD-10-CM | POA: Diagnosis not present

## 2020-05-05 DIAGNOSIS — R946 Abnormal results of thyroid function studies: Secondary | ICD-10-CM | POA: Insufficient documentation

## 2020-05-05 DIAGNOSIS — E039 Hypothyroidism, unspecified: Secondary | ICD-10-CM | POA: Insufficient documentation

## 2020-05-05 DIAGNOSIS — C541 Malignant neoplasm of endometrium: Secondary | ICD-10-CM | POA: Insufficient documentation

## 2020-05-05 DIAGNOSIS — I700 Atherosclerosis of aorta: Secondary | ICD-10-CM | POA: Diagnosis not present

## 2020-05-05 DIAGNOSIS — E041 Nontoxic single thyroid nodule: Secondary | ICD-10-CM | POA: Diagnosis not present

## 2020-05-05 LAB — GLUCOSE, CAPILLARY: Glucose-Capillary: 83 mg/dL (ref 70–99)

## 2020-05-05 MED ORDER — FLUDEOXYGLUCOSE F - 18 (FDG) INJECTION
7.6000 | Freq: Once | INTRAVENOUS | Status: AC | PRN
  Administered 2020-05-05: 7.6 via INTRAVENOUS

## 2020-05-06 ENCOUNTER — Inpatient Hospital Stay (HOSPITAL_BASED_OUTPATIENT_CLINIC_OR_DEPARTMENT_OTHER): Payer: 59 | Admitting: Hematology and Oncology

## 2020-05-06 ENCOUNTER — Inpatient Hospital Stay: Payer: 59

## 2020-05-06 ENCOUNTER — Other Ambulatory Visit: Payer: Self-pay

## 2020-05-06 ENCOUNTER — Other Ambulatory Visit: Payer: Self-pay | Admitting: Hematology and Oncology

## 2020-05-06 ENCOUNTER — Encounter: Payer: Self-pay | Admitting: Hematology and Oncology

## 2020-05-06 DIAGNOSIS — Z7189 Other specified counseling: Secondary | ICD-10-CM

## 2020-05-06 DIAGNOSIS — D61818 Other pancytopenia: Secondary | ICD-10-CM | POA: Insufficient documentation

## 2020-05-06 DIAGNOSIS — C770 Secondary and unspecified malignant neoplasm of lymph nodes of head, face and neck: Secondary | ICD-10-CM | POA: Insufficient documentation

## 2020-05-06 DIAGNOSIS — E041 Nontoxic single thyroid nodule: Secondary | ICD-10-CM | POA: Insufficient documentation

## 2020-05-06 DIAGNOSIS — Z90710 Acquired absence of both cervix and uterus: Secondary | ICD-10-CM | POA: Insufficient documentation

## 2020-05-06 DIAGNOSIS — Z90722 Acquired absence of ovaries, bilateral: Secondary | ICD-10-CM | POA: Insufficient documentation

## 2020-05-06 DIAGNOSIS — E039 Hypothyroidism, unspecified: Secondary | ICD-10-CM

## 2020-05-06 DIAGNOSIS — Z5112 Encounter for antineoplastic immunotherapy: Secondary | ICD-10-CM | POA: Insufficient documentation

## 2020-05-06 DIAGNOSIS — Z79899 Other long term (current) drug therapy: Secondary | ICD-10-CM | POA: Insufficient documentation

## 2020-05-06 DIAGNOSIS — I10 Essential (primary) hypertension: Secondary | ICD-10-CM

## 2020-05-06 DIAGNOSIS — Z7982 Long term (current) use of aspirin: Secondary | ICD-10-CM | POA: Insufficient documentation

## 2020-05-06 DIAGNOSIS — C541 Malignant neoplasm of endometrium: Secondary | ICD-10-CM

## 2020-05-06 DIAGNOSIS — Z923 Personal history of irradiation: Secondary | ICD-10-CM | POA: Diagnosis not present

## 2020-05-06 DIAGNOSIS — Z510 Encounter for antineoplastic radiation therapy: Secondary | ICD-10-CM | POA: Diagnosis not present

## 2020-05-06 DIAGNOSIS — G35 Multiple sclerosis: Secondary | ICD-10-CM | POA: Insufficient documentation

## 2020-05-06 LAB — CBC WITH DIFFERENTIAL/PLATELET
Abs Immature Granulocytes: 0.01 10*3/uL (ref 0.00–0.07)
Basophils Absolute: 0 10*3/uL (ref 0.0–0.1)
Basophils Relative: 1 %
Eosinophils Absolute: 0.2 10*3/uL (ref 0.0–0.5)
Eosinophils Relative: 7 %
HCT: 31.5 % — ABNORMAL LOW (ref 36.0–46.0)
Hemoglobin: 10.4 g/dL — ABNORMAL LOW (ref 12.0–15.0)
Immature Granulocytes: 0 %
Lymphocytes Relative: 22 %
Lymphs Abs: 0.7 10*3/uL (ref 0.7–4.0)
MCH: 31.1 pg (ref 26.0–34.0)
MCHC: 33 g/dL (ref 30.0–36.0)
MCV: 94.3 fL (ref 80.0–100.0)
Monocytes Absolute: 0.3 10*3/uL (ref 0.1–1.0)
Monocytes Relative: 10 %
Neutro Abs: 2 10*3/uL (ref 1.7–7.7)
Neutrophils Relative %: 60 %
Platelets: 159 10*3/uL (ref 150–400)
RBC: 3.34 MIL/uL — ABNORMAL LOW (ref 3.87–5.11)
RDW: 13.2 % (ref 11.5–15.5)
WBC: 3.4 10*3/uL — ABNORMAL LOW (ref 4.0–10.5)
nRBC: 0 % (ref 0.0–0.2)

## 2020-05-06 LAB — COMPREHENSIVE METABOLIC PANEL
ALT: 21 U/L (ref 0–44)
AST: 25 U/L (ref 15–41)
Albumin: 3.9 g/dL (ref 3.5–5.0)
Alkaline Phosphatase: 51 U/L (ref 38–126)
Anion gap: 8 (ref 5–15)
BUN: 23 mg/dL (ref 8–23)
CO2: 28 mmol/L (ref 22–32)
Calcium: 10 mg/dL (ref 8.9–10.3)
Chloride: 102 mmol/L (ref 98–111)
Creatinine, Ser: 0.85 mg/dL (ref 0.44–1.00)
GFR, Estimated: >60 mL/min (ref >60)
Glucose, Bld: 105 mg/dL — ABNORMAL HIGH (ref 70–99)
Potassium: 3.8 mmol/L (ref 3.5–5.1)
Sodium: 138 mmol/L (ref 135–145)
Total Bilirubin: 0.4 mg/dL (ref 0.3–1.2)
Total Protein: 7.4 g/dL (ref 6.5–8.1)

## 2020-05-06 LAB — TOTAL PROTEIN, URINE DIPSTICK: Protein, ur: NEGATIVE mg/dL

## 2020-05-06 LAB — TSH: TSH: 0.88 u[IU]/mL (ref 0.308–3.960)

## 2020-05-06 MED ORDER — SODIUM CHLORIDE 0.9 % IV SOLN
200.0000 mg | Freq: Once | INTRAVENOUS | Status: AC
  Administered 2020-05-06: 200 mg via INTRAVENOUS
  Filled 2020-05-06: qty 8

## 2020-05-06 MED ORDER — HEPARIN SOD (PORK) LOCK FLUSH 100 UNIT/ML IV SOLN
500.0000 [IU] | Freq: Once | INTRAVENOUS | Status: AC | PRN
  Administered 2020-05-06: 500 [IU]
  Filled 2020-05-06: qty 5

## 2020-05-06 MED ORDER — LENVIMA (10 MG DAILY DOSE) 10 MG PO CPPK
10.0000 mg | ORAL_CAPSULE | Freq: Every day | ORAL | 11 refills | Status: DC
Start: 2020-05-06 — End: 2020-05-06

## 2020-05-06 MED ORDER — SODIUM CHLORIDE 0.9% FLUSH
10.0000 mL | INTRAVENOUS | Status: DC | PRN
  Administered 2020-05-06: 10 mL
  Filled 2020-05-06: qty 10

## 2020-05-06 MED ORDER — SODIUM CHLORIDE 0.9 % IV SOLN
Freq: Once | INTRAVENOUS | Status: AC
  Filled 2020-05-06: qty 250

## 2020-05-06 MED FILL — LENVIMA 10 MG DAILY DOSE: 10 | 30 days supply | Qty: 15 | Fill #1

## 2020-05-06 NOTE — Assessment & Plan Note (Signed)
This is due to side effects of treatment She is not symptomatic Observe only 

## 2020-05-06 NOTE — Patient Instructions (Signed)
[facility] Discharge Instructions for Patients Receiving Chemotherapy  Today you received the following monoclonal antibody agents Pembrolizumab (KEYTRUDA).  To help prevent nausea and vomiting after your treatment, we encourage you to take your nausea medication as prescribed.   If you develop nausea and vomiting that is not controlled by your nausea medication, call the clinic.   BELOW ARE SYMPTOMS THAT SHOULD BE REPORTED IMMEDIATELY:  *FEVER GREATER THAN 100.5 F  *CHILLS WITH OR WITHOUT FEVER  NAUSEA AND VOMITING THAT IS NOT CONTROLLED WITH YOUR NAUSEA MEDICATION  *UNUSUAL SHORTNESS OF BREATH  *UNUSUAL BRUISING OR BLEEDING  TENDERNESS IN MOUTH AND THROAT WITH OR WITHOUT PRESENCE OF ULCERS  *URINARY PROBLEMS  *BOWEL PROBLEMS  UNUSUAL RASH Items with * indicate a potential emergency and should be followed up as soon as possible.  Feel free to call the clinic should you have any questions or concerns. The clinic phone number is [phone].  Please show the CHEMO ALERT CARD at check-in to the Emergency Department and triage nurse.   

## 2020-05-06 NOTE — Assessment & Plan Note (Signed)
She has intermittent elevated TSH I will adjust her thyroid medicine accordingly 

## 2020-05-06 NOTE — Progress Notes (Signed)
[place] OFFICE PROGRESS NOTE  Patient Care Team: [name], MD as PCP - General (Family Medicine) Reynold Bowen, MD as Consulting Physician (Endocrinology)  ASSESSMENT & PLAN:  Endometrial cancer [facility]) I have reviewed multiple imaging studies with the patient The lymphadenopathy in the mediastinum are stable but the one in the right supraclavicular region are enlarged The rate of the enlargement is very slow She is not symptomatic The area of the lymph node has been biopsied before that confirmed recurrent metastatic uterine cancer We discussed the risk and benefits of continuing current treatment with the exception of increasing lenvatinib to daily and to add radiation treatment in a palliative fashion to the supraclavicular region versus discontinuing all treatment and switch back to chemotherapy After much discussions, she is in agreement to increase lenvatinib back to daily and to be referred back to radiation oncologist for palliative radiation to the neck  Pancytopenia, acquired [facility]) This is due to side effects of treatment She is not symptomatic Observe only  Acquired hypothyroidism She has intermittent elevated TSH I will adjust her thyroid medicine accordingly  Goals of care, counseling/discussion We have extensive discussions about goals of care and she is in agreement with the plan of care as outlined above   No orders of the defined types were placed in this encounter.   All questions were answered. The patient knows to call the clinic with any problems, questions or concerns. The total time spent in the appointment was 40 minutes encounter with patients including review of chart and various tests results, discussions about plan of care and coordination of care plan   [name], MD 05/06/2020 12:40 PM  INTERVAL HISTORY: Please see below for problem oriented charting. She returns for further follow-up and review test results Her husband was  recently hospitalized for decompensated liver failure She tolerated recent treatment well No new side effects  SUMMARY OF ONCOLOGIC HISTORY: Oncology History Overview Note  [employer] testing done 11-2015 on surgical path from 2014: MS stable TMB low 4 muts/mb ATM [id number] ERBB3 T389K E2H2 rearrangement exon 9 PPP2R1A P179R TP53 I195N    ER- APPROXIMATELY 25-35% STAINING IN NEOPLASTIC CELLS (INTERMEDIATE)  PR- APPROXIMATELY 25-35% STAINING IN NEOPLASTIC CELLS (STRONG)  Repeat biopsy 04/02/17: ER negative, Her 2 negative  Progressed on Doxil   Endometrial cancer ([place])  06/20/2012 Pathology Results   Biopsy positive for papillary serous carcinoma   06/20/2012 Genetic Testing   Foundation One testing done 11-2015 on surgical path from 2014: MS stable TMB low 4 muts/mb ATM [id number] ERBB3 T389K E2H2 rearrangement exon 9 PPP2R1A P179R TP53 I195N    06/20/2012 Initial Diagnosis   Patient presented to PCP with intermittent vaginal bleeding since ~ Oct 2013, endometrial biopsy 06-20-12 with complex endometrial hyperplasia with atypia   06/26/2012 Imaging   Thickened endometrial lining in a postmenopausal patient experiencing vaginal bleeding. In the setting of post-menopausal bleeding, endometrial sampling is indicated to exclude carcinoma. No focal myometrial abnormalities are seen.  Normal left ovary and non-visualized right ovary   07/30/2012 Surgery   Dr. Polly Cobia performed robotic hysterectomy with bilateral salpingo-oophorectomy, bilateral pelvic lymph node dissection and periaortic lymph node dissection. Intraoperatively on frozen section, the patient was noted to have a large endometrial polyp with changes within the polyp consistent for high-grade malignancy, possibly papillary serous carcinoma. There is no obvious extrauterine disease noted.     07/30/2012 Pathology Results   [phone] SUPPLEMENTAL REPORT  THE ENDOMETRIAL CARCINOMA WAS  ANALYZED FOR DNA MISMATCH REPAIR  PROTEINS.IMMUNOHISTOCHEMICALLY, THE NEOPLASM RETAINED NUCLEAR EXPRESSION OF 4 GENE PRODUCTS, MLH1, MSH2, MSH6, AND PMS2, INVOLVED IN DNA MISMATCH REPAIR.POSITIVE AND NEGATIVE CONTROLS WORKED APPROPRIATELY.  PER REQUEST, AN ER AND PR ARE PERFORMED ON BLOCK 1I.  ER- APPROXIMATELY 25-35% STAINING IN NEOPLASTIC CELLS (INTERMEDIATE)  PR- APPROXIMATELY 25-35% STAINING IN NEOPLASTIC CELLS (STRONG)  ER AND PR PREDOMINANTLY SHOW STAINING IN THE SEROUS COMPONENT.  DIAGNOSIS  1. UTERUS, CERVIX WITH BILATERAL FALLOPIAN TUBES AND OVARIES,  HYSTERECTOMY AND BILATERAL SALPINGO-OOPHORECTOMY:  HIGH GRADE/POORLY DIFFERENTIATED ENDOMETRIAL ADENOCARCINOMA WITH SOLID AND SEROUS PAPILLARY COMPONENTS.  HISTOPATHOLOGIC TYPE:A VARIETY OF PATTERNS WERE PRESENT, ALL OF WHICH SHOULD BE CONSIDERED TO BE HIGH GRADE.SEROUS PAPILLARY CARCINOMA WAS SEEN OCCURRING ADJACENT TO SOLID ENDOMETRIAL CARCINOMA WITH MARKED ANAPLASIA AND GIANT CELLS. SIZE:TUMOR MEASURED AT LEAST 4.8 CM IN GREATEST HORIZONTAL DIMENSION.  GRADE: POORLY DIFFERENTIATED OR HIGH GRADE. DEPTH OF INVASION: NO DEFINITE MYOMETRIAL INVOLVEMENT WAS SEEN.THE TUMOR APPEARED CONFINED TO THE POLYP AS WELL AS SURFACE ENDOMETRIUM. WHERE MYOMETRIAL THICKNESS NOT APPLICABLE. SEROSAL INVOLVEMENT: NOT DEMONSTRATED.  ENDOCERVICAL INVOLVEMENT:NOT DEMONSTRATED. RESECTION MARGINS: FREE OF INVOLVEMENT. EXTRAUTERINE EXTENSION:NOT DEMONSTRATED. ANGIOLYMPHATIC INVASION: NOT DEFINITELY SEEN. TOTAL NODES EXAMINED:22.  PELVIC NODES EXAMINED:20.  PELVIC NODES INVOLVED:0.  PARA-AORTIC NODES 2. EXAMINED:  PARA-AORTIC NODES 0. INVOLVED TNM STAGE: T1A N0 MX. AJCC STAGE GROUPING: IA. FIGO STAGE:IA.   08/26/2012 Imaging   No CT evidence for intra-abdominal or pelvic metastatic disease. Trace free pelvic fluid, presumably postoperative although the date  of surgery is not documented in the electronic medical record.   08/26/2012 - 12/13/2012 Chemotherapy   She received 6 cycles of carbo/taxol    10/29/2012 - 11/27/2012 Radiation Therapy   May 27, June 5,  June 11, June 19, November 27, 2012: Proximal vagina 30 Gy in 5 fractions     01/17/2013 Imaging   1.  No evidence of recurrent or metastatic disease. 2.  No acute abnormality involving the abdomen or pelvis. 3.  Mild diffuse hepatic steatosis. 4.  Very small supraumbilical midline anterior abdominal wall hernia containing fat, unchanged   01/22/2014 Imaging   No evidence for metastatic or recurrent disease. 2. No bowel obstruction.  Normal appendix. 3. Small fat containing hernia, stable in appearance. 4. Status post hysterectomy and bilateral oophorectomy   02/19/2014 Imaging   No pulmonary lesions are identified. The abnormality on the chest x-ray is due to asymmetric left-sided sternoclavicular joint degenerative disease   10/12/2014 Imaging   New mild retroperitoneal lymphadenopathy in the left paraaortic region and proximal left common iliac chain, consistent with metastatic disease. No other sites of metastatic disease identified within the abdomen or pelvis.     11/27/2014 - 02/11/2015 Chemotherapy   She received 4 cycles of carbo/taxol    03/01/2015 PET scan   Single hypermetabolic small retroperitoneal lymph node along the aorta. 2. No evidence of metastatic disease otherwise in the abdomen or pelvis. No evidence local recurrence. 3. Intensely hypermetabolic enlarged nodule adjacent to the RIGHT lobe of thyroid gland. This presumably represents the biopsied lesion in clinician report which was found to be benign thyroid tissue.   04/05/2015 - 05/13/2015 Radiation Therapy   She received 50.4 gray in 28 fractions with simultaneous integrated boost to 56 gray   06/17/2015 Imaging   No acute process or evidence of metastatic disease in the abdomen or pelvis. Resolution of previously  described retroperitoneal adenopathy. 2.  Possible constipation. 3. Atherosclerosis.   06/17/2015 Tumor Marker   Patient's tumor was tested for the following markers: CA125 Results of the tumor  marker test revealed 33   08/12/2015 Tumor Marker   Patient's tumor was tested for the following markers: CA125 Results of the tumor marker test revealed 52   08/31/2015 PET scan   Development of right paratracheal hypermetabolic adenopathy, consistent with nodal metastasis. 2. The previously described isolated abdominal retroperitoneal hypermetabolic node has resolved. 3. Persistent hypermetabolic right thyroid nodule, per report previously biopsied. Correlate with those results. 4.  Possible constipation.   09/09/2015 -  Anti-estrogen oral therapy   She has been receiving alternative treatment between megace and tamoxifen   09/09/2015 Tumor Marker   Patient's tumor was tested for the following markers: CA125 Results of the tumor marker test revealed 37.8   09/20/2015 Procedure   Technically successful ultrasound-guided thyroid aspiration biopsy , dominant right nodule   09/20/2015 Pathology Results   THYROID, RIGHT, FINE NEEDLE ASPIRATION (SPECIMEN 1 OF 1 COLLECTED 09/20/15): FINDINGS CONSISTENT WITH BENIGN THYROID NODULE (BETHESDA CATEGORY II).   10/28/2015 Tumor Marker   Patient's tumor was tested for the following markers: CA125 Results of the tumor marker test revealed 53.2   11/04/2015 Imaging   Stable benign right thyroid nodule   12/16/2015 Tumor Marker   Patient's tumor was tested for the following markers: CA125 Results of the tumor marker test revealed 38.1   03/22/2016 PET scan   Interval progression of hypermetabolic right paratracheal lymph node consistent with metastatic involvement. 2. Stable hypermetabolic right thyroid nodule. Reportedly this has been biopsied in the past.   03/23/2016 Tumor Marker   Patient's tumor was tested for the following markers: CA125 Results of the tumor  marker test revealed 62.4   04/13/2016 - 06/01/2016 Radiation Therapy   She received 56 Gy to the chest in 28 fractions    05/09/2016 Imaging   No evidence of left lower extremity deep vein thrombosis. No evidence of a superficial thrombosis of the greater and lesser saphenous veins. Positive for thrombus noted in several varicosities of the calf. No evidence of Baker's cyst on the left.   05/17/2016 Tumor Marker   Patient's tumor was tested for the following markers: CA125 Results of the tumor marker test revealed 9   06/29/2016 Tumor Marker   Patient's tumor was tested for the following markers: CA125 Results of the tumor marker test revealed 5.9   08/04/2016 Tumor Marker   Patient's tumor was tested for the following markers: CA125 Results of the tumor marker test revealed 6.0   09/12/2016 PET scan   Complete metabolic response to therapy, with resolution of hypermetabolic mediastinal lymphadenopathy since prior exam. No residual or new metastatic disease identified. Stable hypermetabolic right thyroid lobe nodule, which was previously biopsied on 09/20/2015.   03/13/2017 PET scan   1. Solitary focus of recurrent right paratracheal hypermetabolic activity, with a 1.0 cm right lower paratracheal node having a maximum SUV of 9.0. Appearance compatible with recurrent malignancy. 2. Continued hypermetabolic right thyroid nodule, previously biopsied, presumed benign -correlate with prior biopsy results. 3. Other imaging findings of potential clinical significance: Aortic Atherosclerosis (ICD10-I70.0). Mild cardiomegaly. Prominent stool throughout the colon favors constipation.   04/02/2017 Pathology Results   FINE NEEDLE ASPIRATION, ENDOSCOPIC, (EBUS) 4 R NODE (SPECIMEN 1 OF 2 COLLECTED 04/02/17): MALIGNANT CELLS PRESENT, CONSISTENT WITH CARCINOMA. SEE COMMENT. COMMENT: THE MALIGNANT CELLS ARE POSITIVE FOR P53 AND NEGATIVE FOR ESTROGEN RECEPTOR AND TTF-1. THIS PROFILE IS NON-SPECIFIC, BUT  THE P53 POSITIVE STAINING IS SUGGESTIVE OF GYNECOLOGIC PRIMARY.    04/11/2017 Procedure   Successful 8 French right internal jugular vein power  port placement with its tip at the SVC/RA junction   04/12/2017 Imaging   Normal LV size with mild LV hypertrophy. EF 55-60%. Normal RV size and systolic function. Aortic valve sclerosis without significant stenosis.   04/18/2017 - 06/14/2017 Chemotherapy   The patient had chemotherapy with Doxil    07/10/2017 Imaging   - Left ventricle: The cavity size was normal. There was mild concentric hypertrophy. Systolic function was normal. The estimated ejection fraction was in the range of 55% to 60%. Wall motion was normal; there were no regional wall motion abnormalities. Doppler parameters are consistent with abnormal left ventricular relaxation (grade 1 diastolic dysfunction). - Aortic valve: Noncoronary cusp mobility was mildly restricted. - Mitral valve: There was mild regurgitation. - Left atrium: The atrium was mildly dilated. - Atrial septum: No defect or patent foramen ovale was identified.  Impressions:  - Compared to November 2018, global LV longitudinal strain remains normal (has increased)   07/11/2017 PET scan   Increased size and hypermetabolic activity of 3.3 cm right thyroid lobe nodule. Thyroid carcinoma cannot be excluded. Recommend repeat ultrasound guided fine needle aspiration to exclude thyroid carcinoma.  New adjacent hypermetabolic 10 mm right supraclavicular lymph node, suspicious for lymph node metastasis.  Slight increase in size and hypermetabolic activity of solitary right paratracheal lymph node.  No evidence of abdominal or pelvic metastatic disease.   07/18/2017 Procedure   1. Technically successful ultrasound guided fine needle aspiration of indeterminate hypermetabolic right-sided thyroid nodule/mass. 2. Technically successful ultrasound-guided core needle biopsy of hypermetabolic right lower cervical lymph node.    07/18/2017 Pathology Results   THYROID, FINE NEEDLE ASPIRATION RIGHT (SPECIMEN 1 OF 1, COLLECTED ON 07/18/2017): ATYPIA OF UNDETERMINED SIGNIFICANCE OR FOLLICULAR LESION OF UNDETERMINED SIGNIFICANCE (BETHESDA CATEGORY III). SEE COMMENT. COMMENT: THE SPECIMEN CONSISTS OF SMALL AND MEDIUM SIZED GROUPS OF FOLLICULAR EPITHELIAL CELLS WITH MILD CYTOLOGIC ATYPIA INCLUDING NUCLEAR ENLARGEMENT AND HURTHLE CELL CHANGE. SOME GROUPS ARE ARRANGED AS MICROFOLLICLES. THERE IS MINIMAL BACKGROUND COLLOID. BASED ON THESE FEATURES, A FOLLICULAR LESION/NEOPLASM CAN NOT BE ENTIRELY RULED OUT. A SPECIMEN WILL BE SENT FOR AFIRMA TESTING.   07/19/2017 Pathology Results   Lymph node, needle/core biopsy - METASTATIC PAPILLARY SEROUS CARCINOMA. - SEE COMMENT. Microscopic Comment Dr. Vicente Males has reviewed the case and concurs with this interpretation   10/15/2017 PET scan   No new or progressive disease. No evidence of abdominal or pelvic metastatic disease.  Stable hypermetabolic right thyroid lobe nodule and adjacent right supraclavicular lymph node.  Decreased size and hypermetabolic activity of solitary right paratracheal lymph node.   01/23/2018 PET scan   1. Overall, no significant change from PET-CT of 3 months ago. There is persistent hypermetabolic activity at the right thoracic inlet and within a right paratracheal mediastinal node. The nodes have not significantly changed in size, although the metabolic activity has minimally increased over this interval. It is uncertain how much of the thoracic inlet metabolic activity is attributed to the thyroid nodule versus adjacent cervical lymph nodes, both previously biopsied. 2. No new hypermetabolic activity within the neck, chest, abdomen or pelvis. No evidence of local recurrence in the pelvis. 3. Stable probable radiation changes in the right lung.   04/16/2018 PET scan   1. Interval increase in metabolic activity of the RIGHT supraclavicular node and RIGHT  lower paratracheal mediastinal node with minimal change in size. Findings consistent with persistent and mildly progressed metastatic adenopathy. 2. New hypermetabolic small lymph node in the RIGHT lower paratracheal nodal station adjacent to the  previously followed node. 3. No evidence of local recurrence in the pelvis or new disease elsewhere.   07/30/2018 PET scan   1. Mild interval progression of right supraclavicular, mediastinal, and right hilar hypermetabolic metastatic disease. There is a new hypermetabolic lymph node in the subcarinal station on today's study. 2. No hypermetabolic disease in the abdomen or pelvis to suggest recurrent/metastases. 3.  Aortic Atherosclerois (ICD10-170.0)    08/16/2018 -  Chemotherapy   The patient had Lenvima since 3/6 and pembrolizumab since 3/13 for chemotherapy treatment. Michel Santee was held temporarily due to infection and severe hypertension   10/31/2018 PET scan   Partial response to therapy, with decreased hypermetabolic lymphadenopathy in mediastinum and right hilar region.  No significant change in hypermetabolic lymphadenopathy in right inferior neck.  No new or progressive metastatic disease identified. No evidence of recurrent or metastatic carcinoma in the pelvis or abdomen   02/04/2019 PET scan   1. Decrease in metabolic activity of RIGHT supraclavicular node and RIGHT paratracheal lymph nodes. 2. Persistent activity in the RIGHT thyroid nodule unchanged. 3. Diffuse activity through the esophagus is favored esophagitis. No change. 4. Scattered foci of intense metabolic activity associated the bowel without focal lesion on CT favored benign. 5. No evidence of peritoneal metastasis. 6. No evidence of metastatic adenopathy in the abdomen pelvis. 7. No evidence of local pelvic sidewall recurrence.   07/30/2019 PET scan   1. Mildly increased uptake within small nodes along the right sternocleidomastoid without change in size and associated  with increased deltoid activity may reflect changes related to right arm injection or recent COVID vaccination, consider clinical correlation and attention on follow-up. 2. Persistent activity in the right thyroid nodule, unchanged from previous exams. 3. New activity in a juxta esophageal lymph node in the chest is suspicious though the node is unchanged with respect to size. Endoscopic correlation could potentially be helpful or close attention on follow-up. 4. Subtle increased FDG uptake along the left mainstem bronchus and adjacent to the aorta may represent a small lymph node. No discrete correlate is demonstrated on today's study.   10/22/2019 PET scan   1. Persistent FDG avid subcentimeter right supraclavicular and juxta esophageal lymph nodes. The degree of FDG uptake is similar to the previous exam. No new or progressive findings identified. 2. No findings of solid organ metastasis, skeletal metastasis or metastatic disease to the abdomen or pelvis. 3. FDG avid right lobe of thyroid gland nodule is again noted This nodule is not incidental and been worked up previously with 2 biopsies. Please refer to results from the most recent biopsy dated 07/18/2017. (Ref: J Am Coll Radiol. 2015 Feb;12(2): 143-50). 4.  Aortic Atherosclerosis (ICD10-I70.0).   05/05/2020 PET scan   1. Progressive disease, as evidenced by increased hypermetabolism within right supraclavicular and mediastinal nodes. 2. No infradiaphragmatic hypermetabolic metastasis. 3.  Aortic Atherosclerosis (ICD10-I70.0). 4. 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)  07/11/2017 Initial Diagnosis   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[place])   08/16/2018 -  Chemotherapy   The patient had pembrolizumab (KEYTRUDA) 200 mg in sodium chloride 0.9 % 50 mL chemo infusion, 200 mg, Intravenous, Once, 27 of 28 cycles Administration: 200  mg (08/16/2018), 200 mg (09/06/2018), 200 mg (09/30/2018), 200 mg (10/21/2018), 200 mg (11/11/2018), 200 mg (12/02/2018), 200 mg (12/23/2018), 200 mg (01/13/2019), 200 mg (02/05/2019), 200 mg (02/27/2019), 200 mg (03/20/2019), 200 mg (04/10/2019), 200 mg (05/08/2019), 200  mg (05/29/2019), 200 mg (06/19/2019), 200 mg (07/10/2019), 200 mg (07/31/2019), 200 mg (08/21/2019), 200 mg (09/11/2019), 200 mg (10/02/2019), 200 mg (10/23/2019), 200 mg (11/13/2019), 200 mg (12/04/2019), 200 mg (12/25/2019), 200 mg (01/15/2020), 200 mg (02/05/2020), 200 mg (02/26/2020)  for chemotherapy treatment.      REVIEW OF SYSTEMS:   Constitutional: Denies fevers, chills or abnormal weight loss Eyes: Denies blurriness of vision Ears, nose, mouth, throat, and face: Denies mucositis or sore throat Respiratory: Denies cough, dyspnea or wheezes Cardiovascular: Denies palpitation, chest discomfort or lower extremity swelling Gastrointestinal:  Denies nausea, heartburn or change in bowel habits Skin: Denies abnormal skin rashes Lymphatics: Denies new lymphadenopathy or easy bruising Neurological:Denies numbness, tingling or new weaknesses Behavioral/Psych: Mood is stable, no new changes  All other systems were reviewed with the patient and are negative.  I have reviewed the past medical history, past surgical history, social history and family history with the patient and they are unchanged from previous note.  ALLERGIES:  has No Known Allergies.  MEDICATIONS:  Current Outpatient Medications  Medication Sig Dispense Refill  . amLODipine (NORVASC) 10 MG tablet TAKE 1 TABLET BY MOUTH ONCE DAILY. **DECREASE SIMVASTATIN TO 20 MG DAILY WHILE ON AMLODIPINE PER MD** 90 tablet 1  . busPIRone (BUSPAR) 10 MG tablet Take 10 mg by mouth 2 (two) times daily.    . Calcium Carb-Cholecalciferol 500-600 MG-UNIT TABS Take 1 tablet by mouth daily.     . Cholecalciferol (VITAMIN D3) 2000 units TABS Take 2,000 Units by mouth daily.     Marland Kitchen lenvatinib 10 mg daily dose  (LENVIMA, 10 MG DAILY DOSE,) capsule Take 1 capsule (10 mg total) by mouth daily. 30 capsule 11  . levothyroxine (SYNTHROID) 75 MCG tablet Take 1 tablet (75 mcg total) by mouth daily before breakfast.    . lidocaine-prilocaine (EMLA) cream APPLY TO AFFECTED AREA ONCE AS DIRECTED 30 g 3  . LORazepam (ATIVAN) 0.5 MG tablet Take 0.5 mg by mouth 2 (two) times daily as needed.  0  . losartan (COZAAR) 25 MG tablet Take 1 tablet (25 mg total) by mouth daily. 30 tablet 11  . metoprolol tartrate (LOPRESSOR) 25 MG tablet TAKE 1 TABLET (25 MG TOTAL) BY MOUTH 2 (TWO) TIMES DAILY. 180 tablet 3  . ondansetron (ZOFRAN) 8 MG tablet Take 1 tablet (8 mg total) by mouth every 8 (eight) hours as needed for nausea. 30 tablet 3  . pantoprazole (PROTONIX) 40 MG tablet Take 1 tablet (40 mg total) by mouth daily. 90 tablet 3  . simvastatin (ZOCOR) 40 MG tablet Take 40 mg by mouth every evening.    . tacrolimus (PROTOPIC) 0.1 % ointment Apply 1 application topically daily as needed (rash).     . urea (CARMOL) 40 % CREA Apply 1 application topically daily. 85 g 1  . venlafaxine XR (EFFEXOR-XR) 75 MG 24 hr capsule Take 300 mg by mouth daily with breakfast.      No current facility-administered medications for this visit.    PHYSICAL EXAMINATION: ECOG PERFORMANCE STATUS: 1 - Symptomatic but completely ambulatory  Vitals:   05/06/20 1140  BP: (!) 109/58  Pulse: 85  Resp: 18  Temp: 97.6 F (36.4 C)  SpO2: 93%   Filed Weights   05/06/20 1140  Weight: 157 lb (71.2 kg)    GENERAL:alert, no distress and comfortable NEURO: alert & oriented x 3 with fluent speech, no focal motor/sensory deficits  LABORATORY DATA:  I have reviewed the data as listed    Component  Value Date/Time   NA 141 04/08/2020 1122   NA 141 05/17/2017 0957   K 4.4 04/08/2020 1122   K 3.6 05/17/2017 0957   CL 107 04/08/2020 1122   CL 105 11/19/2012 0848   CO2 26 04/08/2020 1122   CO2 25 05/17/2017 0957   GLUCOSE 99 04/08/2020 1122    GLUCOSE 85 05/17/2017 0957   GLUCOSE 122 (H) 11/19/2012 0848   BUN 24 (H) 04/08/2020 1122   BUN 15.2 05/17/2017 0957   CREATININE 0.91 04/08/2020 1122   CREATININE 0.83 07/10/2019 1240   CREATININE 0.8 05/17/2017 0957   CALCIUM 9.4 04/08/2020 1122   CALCIUM 9.6 05/17/2017 0957   PROT 6.8 04/08/2020 1122   PROT 7.0 05/17/2017 0957   ALBUMIN 3.6 04/08/2020 1122   ALBUMIN 3.9 05/17/2017 0957   AST 22 04/08/2020 1122   AST 23 07/10/2019 1240   AST 25 05/17/2017 0957   ALT 15 04/08/2020 1122   ALT 20 07/10/2019 1240   ALT 18 05/17/2017 0957   ALKPHOS 54 04/08/2020 1122   ALKPHOS 57 05/17/2017 0957   BILITOT 0.5 04/08/2020 1122   BILITOT 0.4 07/10/2019 1240   BILITOT 0.47 05/17/2017 0957   GFRNONAA >60 04/08/2020 1122   GFRNONAA >60 07/10/2019 1240   GFRAA >60 02/26/2020 0940   GFRAA >60 07/10/2019 1240    No results found for: SPEP, UPEP  Lab Results  Component Value Date   WBC 3.3 (L) 04/08/2020   NEUTROABS 2.1 04/08/2020   HGB 9.5 (L) 04/08/2020   HCT 30.1 (L) 04/08/2020   MCV 97.4 04/08/2020   PLT 161 04/08/2020      Chemistry      Component Value Date/Time   NA 141 04/08/2020 1122   NA 141 05/17/2017 0957   K 4.4 04/08/2020 1122   K 3.6 05/17/2017 0957   CL 107 04/08/2020 1122   CL 105 11/19/2012 0848   CO2 26 04/08/2020 1122   CO2 25 05/17/2017 0957   BUN 24 (H) 04/08/2020 1122   BUN 15.2 05/17/2017 0957   CREATININE 0.91 04/08/2020 1122   CREATININE 0.83 07/10/2019 1240   CREATININE 0.8 05/17/2017 0957   GLU 158 (H) 01/14/2015 1035      Component Value Date/Time   CALCIUM 9.4 04/08/2020 1122   CALCIUM 9.6 05/17/2017 0957   ALKPHOS 54 04/08/2020 1122   ALKPHOS 57 05/17/2017 0957   AST 22 04/08/2020 1122   AST 23 07/10/2019 1240   AST 25 05/17/2017 0957   ALT 15 04/08/2020 1122   ALT 20 07/10/2019 1240   ALT 18 05/17/2017 0957   BILITOT 0.5 04/08/2020 1122   BILITOT 0.4 07/10/2019 1240   BILITOT 0.47 05/17/2017 0957       RADIOGRAPHIC  STUDIES: I have reviewed multiple imaging studies with the patient I have personally reviewed the radiological images as listed and agreed with the findings in the report. NM PET Image Restage (PS) Skull Base to Thigh  Result Date: 05/06/2020 CLINICAL DATA:  Subsequent treatment strategy for restaging of endometrial cancer. COVID-19 vaccine 02/05/2020 EXAM: NUCLEAR MEDICINE PET SKULL BASE TO THIGH TECHNIQUE: 7.6 mCi F-18 FDG was injected intravenously. Full-ring PET imaging was performed from the skull base to thigh after the radiotracer. CT data was obtained and used for attenuation correction and anatomic localization. Fasting blood glucose: 83 mg/dl COMPARISON:  30/81/6838 FINDINGS: Mediastinal blood pool activity: SUV max 1.4 Liver activity: SUV max NA NECK: Right supraclavicular nodes. The more cephalad and posterior measures 8 mm and  a S.U.V. max of 17.7 on 42/4. New since the prior. More medial and inferior right supraclavicular node measures 11 mm and a S.U.V. max of 10.5 on 45/4. Compare 5 mm and a S.U.V. max of 5.9 on the prior. Hypermetabolic right thyroid nodule measures 1.9 cm and a S.U.V. max of 10.4. 2.0 cm and a S.U.V. max of 18.2 on the prior. Incidental CT findings: none CHEST: A left-sided mediastinal node measures 6 mm and a S.U.V. max of 5.0 on 66/4 versus a S.U.V. max of 3.6 on the prior exam. Index periesophageal node measures 8 mm and a S.U.V. max of 8.8 on 75/4 versus similar in size and a S.U.V. max of 7.2 on the prior. Incidental CT findings: Right Port-A-Cath tip at high right atrium. Aortic atherosclerosis. Suspect mild right paramediastinal radiation induced fibrosis including on 24/8. ABDOMEN/PELVIS: No abdominopelvic parenchymal or nodal hypermetabolism. Incidental CT findings: Normal adrenal glands. Abdominal aortic atherosclerosis. Hysterectomy. SKELETON: No abnormal marrow activity. Incidental CT findings: none IMPRESSION: 1. Progressive disease, as evidenced by increased  hypermetabolism within right supraclavicular and mediastinal nodes. 2. No infradiaphragmatic hypermetabolic metastasis. 3.  Aortic Atherosclerosis (ICD10-I70.0). 4. Hypermetabolic right thyroid nodule has been detailed on prior exams and was biopsied on 07/18/2017. Electronically Signed   By: Abigail Miyamoto M.D.   On: 05/06/2020 09:36

## 2020-05-06 NOTE — Assessment & Plan Note (Signed)
I have reviewed multiple imaging studies with the patient The lymphadenopathy in the mediastinum are stable but the one in the right supraclavicular region are enlarged The rate of the enlargement is very slow She is not symptomatic The area of the lymph node has been biopsied before that confirmed recurrent metastatic uterine cancer We discussed the risk and benefits of continuing current treatment with the exception of increasing lenvatinib to daily and to add radiation treatment in a palliative fashion to the supraclavicular region versus discontinuing all treatment and switch back to chemotherapy After much discussions, she is in agreement to increase lenvatinib back to daily and to be referred back to radiation oncologist for palliative radiation to the neck

## 2020-05-06 NOTE — Assessment & Plan Note (Signed)
We have extensive discussions about goals of care and she is in agreement with the plan of care as outlined above

## 2020-05-07 ENCOUNTER — Telehealth: Payer: Self-pay | Admitting: Hematology and Oncology

## 2020-05-07 NOTE — Telephone Encounter (Signed)
Scheduled appt per 12/2 sch msg - pt is aware of appt date and time   

## 2020-05-12 ENCOUNTER — Other Ambulatory Visit: Payer: Self-pay

## 2020-05-12 ENCOUNTER — Ambulatory Visit
Admission: RE | Admit: 2020-05-12 | Discharge: 2020-05-12 | Disposition: A | Discharge status: Home / Self Care | Payer: 59 | Source: Ambulatory Visit | Attending: Radiation Oncology | Admitting: Radiation Oncology

## 2020-05-12 ENCOUNTER — Encounter: Payer: Self-pay | Admitting: Radiation Oncology

## 2020-05-12 VITALS — BP 143/69 | HR 77 | Temp 97.0°F | Resp 18 | Ht 63.0 in | Wt 157.1 lb

## 2020-05-12 DIAGNOSIS — Z7982 Long term (current) use of aspirin: Secondary | ICD-10-CM | POA: Insufficient documentation

## 2020-05-12 DIAGNOSIS — Z923 Personal history of irradiation: Secondary | ICD-10-CM | POA: Diagnosis not present

## 2020-05-12 DIAGNOSIS — C770 Secondary and unspecified malignant neoplasm of lymph nodes of head, face and neck: Secondary | ICD-10-CM

## 2020-05-12 DIAGNOSIS — E041 Nontoxic single thyroid nodule: Secondary | ICD-10-CM | POA: Diagnosis not present

## 2020-05-12 DIAGNOSIS — I10 Essential (primary) hypertension: Secondary | ICD-10-CM | POA: Insufficient documentation

## 2020-05-12 DIAGNOSIS — C778 Secondary and unspecified malignant neoplasm of lymph nodes of multiple regions: Secondary | ICD-10-CM | POA: Diagnosis not present

## 2020-05-12 DIAGNOSIS — Z79899 Other long term (current) drug therapy: Secondary | ICD-10-CM | POA: Insufficient documentation

## 2020-05-12 DIAGNOSIS — C541 Malignant neoplasm of endometrium: Secondary | ICD-10-CM | POA: Diagnosis not present

## 2020-05-12 DIAGNOSIS — I700 Atherosclerosis of aorta: Secondary | ICD-10-CM | POA: Insufficient documentation

## 2020-05-12 NOTE — Progress Notes (Signed)
Histology and Location of Primary Cancer: Endometrial cancer  Location(s) of Symptomatic tumor(s): Right supraclavicular lymph nodes  Past/Anticipated chemotherapy by medical oncology, if any:  Increased LENVATINIB to daily   Patient's main complaints related to symptomatic tumor(s) are: None. Right supraclavicular nodes are slow growing at this time. Mediastinal nodes are stable.   Pain on a scale of 0-10 is: denies pain at this time     If Spine Met(s), symptoms, if any, include:  Bowel/Bladder retention or incontinence (please describe): denies  Numbness or weakness in extremities (please describe): denies  Current Decadron regimen, if applicable: denies  Ambulatory status? Walker? Wheelchair?: ambulatory  SAFETY ISSUES:  Prior radiation? yes  Pacemaker/ICD? denies  Possible current pregnancy? No, total hysterectomy  Is the patient on methotrexate? denies  Additional Complaints / other details:  64 year old female. Married.

## 2020-05-12 NOTE — Progress Notes (Signed)
Radiation Oncology         [phone] ________________________________  Name: Diane Lozano MRN: [id number]  Date: 05/12/2020  DOB: 12-25-1955  Re-Evaluation Note  CC: [name], MD  [name], MD    ICD-10-CM   1. 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 Recurrent carcinoma of endometrium (HCC)  C54.1     Diagnosis:  Recurrent endometrial cancer (high-grade serous adenocarcinoma) with metastasis to the retroperitoneal lymph nodes and isolated intrathoracic nodal recurrence, now with progressive disease within the right supraclavicular   Narrative:  The patient returns today to discuss radiation treatment options. She was initially seen in consultation on 09/09/2012, after which time she was treated intracavitary high-dose-rate radiation therapy using Iridium 192 for 30 Gy in 5 fractions directed at the proximal vagina (10/29/2012 - 11/27/2012).  She was then seen on 12/31/2014 for local recurrence/PET positive retroperitoneal nodes and underwent IMRT (helical, 6 megavolt photons) for 50.4 Gy in 28 fractIions with simultaneous integrated boost to 56 Gy from 04/05/2015 - 05/13/2015.  She was then seen on 04/05/2016 for progression of a hypermetabolic right paratracheal lymph node that was consistent with metastatic involvement. She was treated with 56 Gy in 28 fractions (3D // 10X, 6X photon) directed at the chest from 04/13/2016 - 06/01/2016.  PET scan on 09/12/2016 showed a complete metabolic response to therapy with resolution of the hypermetabolic mediastinal lymphoadenopathy. There was no residual or new metastatic disease identified.  PET scan on 03/13/2017 showed a solitary focus of recurrent right paratracheal hypermetabolic activity with a 1.0 cm right lower paratracheal node that had a maximum SUV of 9.0. Appearance was compatible with recurrent malignancy. Additionally, there was a continued hypermetabolic right thyroid nodule that was previously biopsied  and presumed benign.   The patient underwent a bronchoscopy on 04/02/2017 under the care of Dr. Vaughan Browner. Fine needle aspiration, endoscopic (EBUS) 4 R node revealed malignant cells consistnet with carcinoma suggestive of gynecologic primary. By immunohistochemistry, the tumor cells were negative for Her2.  The patient underwent chemotherapy with Doxil from 04/18/2017 - 06/14/2017.  PET scan on 07/11/2017 showed increased size and metabolic activity of the 3.3 cm right thyroid lobe nodule; carcinoma could not be excluded. Repeat ultrasound-guided fine needle aspiration was recommended. Additionally, there was a new adjacent hypermetabolic 10 mm right supraclavicular lymph node that was suspicious for lymph node metastasis as well as a slight increase in size and activity of the solitary right paratracheal lymph node. There was no evidence of abdominal or pelvic metastatic disease.  Biopsy of the thyroid nodule on 07/18/2017 showed atypia of undetermined significance or follicular lesion of undetermined significance. Biopsy of hypermetabolic right lower cervical lymph node at that same time revealed metastatic papillary serous carcinoma.  PET scan on 10/15/2017 showed a decrease in size and metabolic activity of the solitary right paratracheal lymph node. The hypermetabolic right thyroid lobe nodule and adjacent right supraclavicular lymph node were stable. There was no new or progressive disease, nor was there any evidence of abdominal or pelvic metastatic disease.  PET scan on 01/23/2018 showed persistent hypermetabolic activity at the right thoracic inlet and within a right paratracheal mediastinal node. The nodes had no significantly changed in size, although the metabolic activity had minimally increased over the interval. It was uncertain how much of the thoracic inlet metabolic activity was attributed to the thyroid nodule versus adjacent cervical lymph nodes. Radiation changes in the right lung were  stable. There was no new hypermetabolic activity  within the neck, chest, abdomen, or pelvis, nor was there evidence of local recurrence in the pelvis.  PET scan on 04/16/2018 showed an interval increase in the metabolic activity of the right supraclavicular node and right lower paratracheal mediastinal node with minimal change in size. Findings were consistent with persistent and mildly progressed metastatic adenopathy. Additionally, there was a new hypermetabolic small lymph node in the right lower paratracheal nodal station adjacent to the previously followed node. There was no evidence of local recurrence in the pelvis or new disease elsewhere.  PET scan on 07/30/2018 showed mild interval progression of right supraclavicular, mediastinal, and right hilar hypermetabolic metastatic disease. There was also a new hypermetabolic lymph node in the subcarinal station. However, there was no hypermetabolic disease in the abdomen or pelvis to suggest recurrence/metastases.  The patient began Lenvima on 08/09/2018 and then Pembrolizumab on 08/16/2019 for  treatment. Lenvima was held temporarily due to infection and severe hypertension.  PET scan on 10/31/2018 showed a partial response to therapy with decreased hypermetabolic lymphadenopathy in the mediastinum and right hilar region. There was no significant change in hypermetabolic lymphadenopathy in the right inferior neck, no new or progressive metastatic disease identified, and no evidence of recurrent or metastatic carcinoma in the pelvis or abdomen.  PET scan on 02/04/2019 showed a decrease in metabolic activity of the right supraclavicular node and right paratracheal lymph nodes. There was persistent activity in the right thyroid nodule that was unchanged. Additionally, there was diffuse activity through the esophagus that was favored to be esophagitis without change. Finally, there was scattered foci of intense metabolic activity associated to the bowel  without focal lesion, favored to be benign. There was no evidence of peritoneal metastasis, metastatic adenopathy in the abdomen/pelvis, or evidence of local pelvic sidewall recurrence.   PET scan on 07/30/2019 showed mildly increased uptake within the small nodes along the right sternocleidomastoid without change in size and associated with increase deltoid activity, which may have reflected changes related to right arm injection/COVID vaccination. There was persistent activity in the right thyroid nodule that was unchanged from previous exams. Additionally, there was new activity in a juxta esophageal lymph node in the chest that was suspicious though the node was unchanged with respect to size. Endoscopic correlation or close attention on follow-up was recommended. Finally, there was subtle increased FDG uptake along the left mainstem bronchus and adjacent to the aorta that may have represented a small lymph node. However, no discrete correlate was demonstrated on exam.  PET scan on 10/22/2019 showed persistent FDG avid subcentimeter right supraclavicular and juxta esophageal lymph nodes. The degree of FDG uptake was similar to the previous exam. There was no new or progressive findings identified. The right FDG avid right lobe of the thyroid gland was again noted. There were no findings of solid organ metastasis, skeletal metastasis, or metastatic disease to the abdomen or pelvis.   Last chemotherapy treatment with Beryle Flock was given on 02/26/2020.  Most recent PET scan on 05/05/2020 showed progressive disease as evidenced by increased hypermetabolism within the right supraclavicular, mediastinal nodes relatively stable with minimal activity. There was no infradiaphragmatic hypermetabolic metastasis.   On review of systems, the patient reports no complaints. She denies pain, bowel/bladder retention or incontinence, numbness or weakness in extremities, and any other symptoms.   Patient is now referred  to radiation oncology for consideration for additional treatment directed at the active disease in the right supraclavicular fossa.   Allergies:  has No Known Allergies.  Meds:  Current Outpatient Medications  Medication Sig Dispense Refill  . amLODipine (NORVASC) 10 MG tablet TAKE 1 TABLET BY MOUTH ONCE DAILY. **DECREASE SIMVASTATIN TO 20 MG DAILY WHILE ON AMLODIPINE PER MD** 90 tablet 1  . busPIRone (BUSPAR) 10 MG tablet Take 10 mg by mouth 2 (two) times daily.    . Calcium Carb-Cholecalciferol 500-600 MG-UNIT TABS Take 1 tablet by mouth daily.     . Cholecalciferol (VITAMIN D3) 2000 units TABS Take 2,000 Units by mouth daily.     . hydrochlorothiazide (MICROZIDE) 12.5 MG capsule Take 12.5 mg by mouth daily.    Marland Kitchen lenvatinib 10 mg daily dose (LENVIMA, 10 MG DAILY DOSE,) capsule Take 1 capsule (10 mg total) by mouth daily. 30 capsule 11  . levothyroxine (SYNTHROID) 75 MCG tablet Take 1 tablet (75 mcg total) by mouth daily before breakfast.    . lidocaine-prilocaine (EMLA) cream APPLY TO AFFECTED AREA ONCE AS DIRECTED 30 g 3  . losartan (COZAAR) 25 MG tablet Take 1 tablet (25 mg total) by mouth daily. 30 tablet 11  . metoprolol tartrate (LOPRESSOR) 25 MG tablet TAKE 1 TABLET (25 MG TOTAL) BY MOUTH 2 (TWO) TIMES DAILY. 180 tablet 3  . pantoprazole (PROTONIX) 40 MG tablet Take 1 tablet (40 mg total) by mouth daily. 90 tablet 3  . simvastatin (ZOCOR) 20 MG tablet SMARTSIG:1 Tablet(s) By Mouth Every Evening    . tacrolimus (PROTOPIC) 0.1 % ointment Apply 1 application topically daily as needed (rash).     . venlafaxine XR (EFFEXOR-XR) 150 MG 24 hr capsule Take 300 mg by mouth every morning.    Marland Kitchen aspirin 81 MG EC tablet Take by mouth. (Patient not taking: Reported on 05/12/2020)    . LORazepam (ATIVAN) 0.5 MG tablet Take 0.5 mg by mouth 2 (two) times daily as needed. (Patient not taking: Reported on 05/12/2020)  0  . ondansetron (ZOFRAN) 8 MG tablet Take 1 tablet (8 mg total) by mouth every 8 (eight)  hours as needed for nausea. (Patient not taking: Reported on 05/12/2020) 30 tablet 3   No current facility-administered medications for this encounter.    Physical Findings: The patient is in no acute distress. Patient is alert and oriented.  height is _0  (1.6 m) and weight is 157 lb 2 oz (71.3 kg). Her temporal temperature is 97 F (36.1 C) (abnormal). Her blood pressure is 143/69 (abnormal) and her pulse is 77. Her respiration is 18 and oxygen saturation is 97%.   Lungs are clear to auscultation bilaterally. Heart has regular rate and rhythm. No palpable cervical,  or axillary adenopathy. Abdomen soft, non-tender, normal bowel sounds. The patient has a approximately 1 cm palpable node in the right supraclavicular fossa.  Lab Findings: Lab Results  Component Value Date   WBC 3.4 (L) 05/06/2020   HGB 10.4 (L) 05/06/2020   HCT 31.5 (L) 05/06/2020   MCV 94.3 05/06/2020   PLT 159 05/06/2020    Radiographic Findings: NM PET Image Restage (PS) Skull Base to Thigh  Result Date: 05/06/2020 CLINICAL DATA:  Subsequent treatment strategy for restaging of endometrial cancer. COVID-19 vaccine 02/05/2020 EXAM: NUCLEAR MEDICINE PET SKULL BASE TO THIGH TECHNIQUE: 7.6 mCi F-18 FDG was injected intravenously. Full-ring PET imaging was performed from the skull base to thigh after the radiotracer. CT data was obtained and used for attenuation correction and anatomic localization. Fasting blood glucose: 83 mg/dl COMPARISON:  10/22/2019 FINDINGS: Mediastinal blood pool activity: SUV max 1.4 Liver activity: SUV max NA NECK: Right supraclavicular nodes.  The more cephalad and posterior measures 8 mm and a S.U.V. max of 17.7 on 42/4. New since the prior. More medial and inferior right supraclavicular node measures 11 mm and a S.U.V. max of 10.5 on 45/4. Compare 5 mm and a S.U.V. max of 5.9 on the prior. Hypermetabolic right thyroid nodule measures 1.9 cm and a S.U.V. max of 10.4. 2.0 cm and a S.U.V. max of 18.2 on  the prior. Incidental CT findings: none CHEST: A left-sided mediastinal node measures 6 mm and a S.U.V. max of 5.0 on 66/4 versus a S.U.V. max of 3.6 on the prior exam. Index periesophageal node measures 8 mm and a S.U.V. max of 8.8 on 75/4 versus similar in size and a S.U.V. max of 7.2 on the prior. Incidental CT findings: Right Port-A-Cath tip at high right atrium. Aortic atherosclerosis. Suspect mild right paramediastinal radiation induced fibrosis including on 24/8. ABDOMEN/PELVIS: No abdominopelvic parenchymal or nodal hypermetabolism. Incidental CT findings: Normal adrenal glands. Abdominal aortic atherosclerosis. Hysterectomy. SKELETON: No abnormal marrow activity. Incidental CT findings: none IMPRESSION: 1. Progressive disease, as evidenced by increased hypermetabolism within right supraclavicular and mediastinal nodes. 2. No infradiaphragmatic hypermetabolic metastasis. 3.  Aortic Atherosclerosis (ICD10-I70.0). 4. Hypermetabolic right thyroid nodule has been detailed on prior exams and was biopsied on 07/18/2017. Electronically Signed   By: Abigail Miyamoto M.D.   On: 05/06/2020 09:36    Impression: Recurrent endometrial cancer (high-grade serous adenocarcinoma) with metastasis to the retroperitoneal lymph nodes and isolated intrathoracic nodal recurrence, now with progressive disease within the right supraclavicular  nodes  I have carefully reviewed the patient's previous treatment as it relates to her current active PET scan along the right supraclavicular region.  She would appear to be a candidate for additional radiation therapy directed this area.  Number of treatments are pending the extent of overlap with her previous radiation therapy to the chest.  The PET scan showed some activity in the mediastinal region but intensity is minimal compared to the activity noted in the right supraclavicular region.  I discussed the overall treatment course side effects and potential toxicities of radiation  therapy in this situation with the patient.  The appears to understand and wishes to proceed with planned course of treatment.  Plan:  Patient is scheduled for CT simulation December 10 with treatments to begin approximately 5 days later on December 15.  Anticipate between 3 and 5 weeks of radiation therapy directed at the right supraclavicular region.  Total time spent in this encounter was 35 minutes which included reviewing the patient's most recent PET scans, chemotherapy, follow-ups, procedure, pathology/cytology reports, physical examination, and documentation.  -----------------------------------  Blair Promise, PhD, MD  This document serves as a record of services personally performed by Gery Pray, MD. It was created on his behalf by Clerance Lav, a trained medical scribe. The creation of this record is based on the scribe's personal observations and the provider's statements to them. This document has been checked and approved by the attending provider.

## 2020-05-14 ENCOUNTER — Other Ambulatory Visit: Payer: Self-pay

## 2020-05-14 ENCOUNTER — Ambulatory Visit
Admission: RE | Admit: 2020-05-14 | Discharge: 2020-05-14 | Disposition: A | Discharge status: Home / Self Care | Payer: 59 | Source: Ambulatory Visit | Attending: Radiation Oncology | Admitting: Radiation Oncology

## 2020-05-14 DIAGNOSIS — E039 Hypothyroidism, unspecified: Secondary | ICD-10-CM | POA: Insufficient documentation

## 2020-05-14 DIAGNOSIS — Z90710 Acquired absence of both cervix and uterus: Secondary | ICD-10-CM | POA: Insufficient documentation

## 2020-05-14 DIAGNOSIS — Z923 Personal history of irradiation: Secondary | ICD-10-CM | POA: Insufficient documentation

## 2020-05-14 DIAGNOSIS — D61818 Other pancytopenia: Secondary | ICD-10-CM | POA: Insufficient documentation

## 2020-05-14 DIAGNOSIS — Z7982 Long term (current) use of aspirin: Secondary | ICD-10-CM | POA: Insufficient documentation

## 2020-05-14 DIAGNOSIS — Z510 Encounter for antineoplastic radiation therapy: Secondary | ICD-10-CM | POA: Insufficient documentation

## 2020-05-14 DIAGNOSIS — C770 Secondary and unspecified malignant neoplasm of lymph nodes of head, face and neck: Secondary | ICD-10-CM | POA: Insufficient documentation

## 2020-05-14 DIAGNOSIS — Z79899 Other long term (current) drug therapy: Secondary | ICD-10-CM | POA: Insufficient documentation

## 2020-05-14 DIAGNOSIS — G35 Multiple sclerosis: Secondary | ICD-10-CM | POA: Insufficient documentation

## 2020-05-14 DIAGNOSIS — C541 Malignant neoplasm of endometrium: Secondary | ICD-10-CM | POA: Diagnosis not present

## 2020-05-14 DIAGNOSIS — Z5112 Encounter for antineoplastic immunotherapy: Secondary | ICD-10-CM | POA: Diagnosis not present

## 2020-05-14 DIAGNOSIS — E041 Nontoxic single thyroid nodule: Secondary | ICD-10-CM | POA: Diagnosis not present

## 2020-05-14 DIAGNOSIS — Z90722 Acquired absence of ovaries, bilateral: Secondary | ICD-10-CM | POA: Insufficient documentation

## 2020-05-18 DIAGNOSIS — Z7982 Long term (current) use of aspirin: Secondary | ICD-10-CM | POA: Diagnosis not present

## 2020-05-18 DIAGNOSIS — Z510 Encounter for antineoplastic radiation therapy: Secondary | ICD-10-CM | POA: Diagnosis not present

## 2020-05-18 DIAGNOSIS — E039 Hypothyroidism, unspecified: Secondary | ICD-10-CM | POA: Diagnosis not present

## 2020-05-18 DIAGNOSIS — C770 Secondary and unspecified malignant neoplasm of lymph nodes of head, face and neck: Secondary | ICD-10-CM | POA: Diagnosis not present

## 2020-05-18 DIAGNOSIS — C541 Malignant neoplasm of endometrium: Secondary | ICD-10-CM | POA: Diagnosis not present

## 2020-05-18 DIAGNOSIS — E041 Nontoxic single thyroid nodule: Secondary | ICD-10-CM | POA: Diagnosis not present

## 2020-05-18 DIAGNOSIS — Z5112 Encounter for antineoplastic immunotherapy: Secondary | ICD-10-CM | POA: Diagnosis not present

## 2020-05-18 DIAGNOSIS — D61818 Other pancytopenia: Secondary | ICD-10-CM | POA: Diagnosis not present

## 2020-05-18 DIAGNOSIS — G35 Multiple sclerosis: Secondary | ICD-10-CM | POA: Diagnosis not present

## 2020-05-19 ENCOUNTER — Ambulatory Visit
Admission: RE | Admit: 2020-05-19 | Discharge: 2020-05-19 | Disposition: A | Discharge status: Home / Self Care | Payer: 59 | Source: Ambulatory Visit | Attending: Radiation Oncology | Admitting: Radiation Oncology

## 2020-05-19 ENCOUNTER — Other Ambulatory Visit: Payer: Self-pay

## 2020-05-19 DIAGNOSIS — D61818 Other pancytopenia: Secondary | ICD-10-CM | POA: Diagnosis not present

## 2020-05-19 DIAGNOSIS — Z7982 Long term (current) use of aspirin: Secondary | ICD-10-CM | POA: Diagnosis not present

## 2020-05-19 DIAGNOSIS — E039 Hypothyroidism, unspecified: Secondary | ICD-10-CM | POA: Diagnosis not present

## 2020-05-19 DIAGNOSIS — Z5112 Encounter for antineoplastic immunotherapy: Secondary | ICD-10-CM | POA: Diagnosis not present

## 2020-05-19 DIAGNOSIS — G35 Multiple sclerosis: Secondary | ICD-10-CM | POA: Diagnosis not present

## 2020-05-19 DIAGNOSIS — E041 Nontoxic single thyroid nodule: Secondary | ICD-10-CM | POA: Diagnosis not present

## 2020-05-19 DIAGNOSIS — Z510 Encounter for antineoplastic radiation therapy: Secondary | ICD-10-CM | POA: Diagnosis not present

## 2020-05-19 DIAGNOSIS — C541 Malignant neoplasm of endometrium: Secondary | ICD-10-CM | POA: Diagnosis not present

## 2020-05-19 DIAGNOSIS — C770 Secondary and unspecified malignant neoplasm of lymph nodes of head, face and neck: Secondary | ICD-10-CM | POA: Diagnosis not present

## 2020-05-19 MED FILL — LENVIMA 10 MG DAILY DOSE: 10 | 30 days supply | Qty: 30 | Fill #0

## 2020-05-19 MED FILL — LOSARTAN POTASSIUM 25 MG TA: 25 | 30 days supply | Qty: 30 | Fill #2

## 2020-05-19 MED FILL — busPIRone HCL 10 MG TABS: 10 | 90 days supply | Qty: 180 | Fill #1

## 2020-05-20 ENCOUNTER — Ambulatory Visit
Admission: RE | Admit: 2020-05-20 | Discharge: 2020-05-20 | Disposition: A | Discharge status: Home / Self Care | Payer: 59 | Source: Ambulatory Visit | Attending: Radiation Oncology | Admitting: Radiation Oncology

## 2020-05-20 ENCOUNTER — Other Ambulatory Visit: Payer: Self-pay

## 2020-05-20 DIAGNOSIS — E039 Hypothyroidism, unspecified: Secondary | ICD-10-CM | POA: Diagnosis not present

## 2020-05-20 DIAGNOSIS — Z510 Encounter for antineoplastic radiation therapy: Secondary | ICD-10-CM | POA: Diagnosis not present

## 2020-05-20 DIAGNOSIS — C770 Secondary and unspecified malignant neoplasm of lymph nodes of head, face and neck: Secondary | ICD-10-CM | POA: Diagnosis not present

## 2020-05-20 DIAGNOSIS — G35 Multiple sclerosis: Secondary | ICD-10-CM | POA: Diagnosis not present

## 2020-05-20 DIAGNOSIS — E041 Nontoxic single thyroid nodule: Secondary | ICD-10-CM | POA: Diagnosis not present

## 2020-05-20 DIAGNOSIS — C541 Malignant neoplasm of endometrium: Secondary | ICD-10-CM | POA: Diagnosis not present

## 2020-05-20 DIAGNOSIS — Z7982 Long term (current) use of aspirin: Secondary | ICD-10-CM | POA: Diagnosis not present

## 2020-05-20 DIAGNOSIS — Z5112 Encounter for antineoplastic immunotherapy: Secondary | ICD-10-CM | POA: Diagnosis not present

## 2020-05-20 DIAGNOSIS — D61818 Other pancytopenia: Secondary | ICD-10-CM | POA: Diagnosis not present

## 2020-05-21 ENCOUNTER — Ambulatory Visit
Admission: RE | Admit: 2020-05-21 | Discharge: 2020-05-21 | Disposition: A | Discharge status: Home / Self Care | Payer: 59 | Source: Ambulatory Visit | Attending: Radiation Oncology | Admitting: Radiation Oncology

## 2020-05-21 ENCOUNTER — Other Ambulatory Visit: Payer: Self-pay

## 2020-05-21 DIAGNOSIS — D61818 Other pancytopenia: Secondary | ICD-10-CM | POA: Diagnosis not present

## 2020-05-21 DIAGNOSIS — Z7982 Long term (current) use of aspirin: Secondary | ICD-10-CM | POA: Diagnosis not present

## 2020-05-21 DIAGNOSIS — E039 Hypothyroidism, unspecified: Secondary | ICD-10-CM | POA: Diagnosis not present

## 2020-05-21 DIAGNOSIS — G35 Multiple sclerosis: Secondary | ICD-10-CM | POA: Diagnosis not present

## 2020-05-21 DIAGNOSIS — C770 Secondary and unspecified malignant neoplasm of lymph nodes of head, face and neck: Secondary | ICD-10-CM | POA: Diagnosis not present

## 2020-05-21 DIAGNOSIS — Z510 Encounter for antineoplastic radiation therapy: Secondary | ICD-10-CM | POA: Diagnosis not present

## 2020-05-21 DIAGNOSIS — Z5112 Encounter for antineoplastic immunotherapy: Secondary | ICD-10-CM | POA: Diagnosis not present

## 2020-05-21 DIAGNOSIS — C541 Malignant neoplasm of endometrium: Secondary | ICD-10-CM | POA: Diagnosis not present

## 2020-05-21 DIAGNOSIS — E041 Nontoxic single thyroid nodule: Secondary | ICD-10-CM | POA: Diagnosis not present

## 2020-05-24 ENCOUNTER — Ambulatory Visit
Admission: RE | Admit: 2020-05-24 | Discharge: 2020-05-24 | Disposition: A | Discharge status: Home / Self Care | Payer: 59 | Source: Ambulatory Visit | Attending: Radiation Oncology | Admitting: Radiation Oncology

## 2020-05-24 DIAGNOSIS — E039 Hypothyroidism, unspecified: Secondary | ICD-10-CM | POA: Diagnosis not present

## 2020-05-24 DIAGNOSIS — G35 Multiple sclerosis: Secondary | ICD-10-CM | POA: Diagnosis not present

## 2020-05-24 DIAGNOSIS — D61818 Other pancytopenia: Secondary | ICD-10-CM | POA: Diagnosis not present

## 2020-05-24 DIAGNOSIS — C541 Malignant neoplasm of endometrium: Secondary | ICD-10-CM | POA: Diagnosis not present

## 2020-05-24 DIAGNOSIS — C770 Secondary and unspecified malignant neoplasm of lymph nodes of head, face and neck: Secondary | ICD-10-CM | POA: Diagnosis not present

## 2020-05-24 DIAGNOSIS — E041 Nontoxic single thyroid nodule: Secondary | ICD-10-CM | POA: Diagnosis not present

## 2020-05-24 DIAGNOSIS — Z510 Encounter for antineoplastic radiation therapy: Secondary | ICD-10-CM | POA: Diagnosis not present

## 2020-05-24 DIAGNOSIS — Z5112 Encounter for antineoplastic immunotherapy: Secondary | ICD-10-CM | POA: Diagnosis not present

## 2020-05-24 DIAGNOSIS — Z7982 Long term (current) use of aspirin: Secondary | ICD-10-CM | POA: Diagnosis not present

## 2020-05-25 ENCOUNTER — Ambulatory Visit
Admission: RE | Admit: 2020-05-25 | Discharge: 2020-05-25 | Disposition: A | Discharge status: Home / Self Care | Payer: 59 | Source: Ambulatory Visit | Attending: Radiation Oncology | Admitting: Radiation Oncology

## 2020-05-25 DIAGNOSIS — D61818 Other pancytopenia: Secondary | ICD-10-CM | POA: Diagnosis not present

## 2020-05-25 DIAGNOSIS — G35 Multiple sclerosis: Secondary | ICD-10-CM | POA: Diagnosis not present

## 2020-05-25 DIAGNOSIS — Z7982 Long term (current) use of aspirin: Secondary | ICD-10-CM | POA: Diagnosis not present

## 2020-05-25 DIAGNOSIS — E041 Nontoxic single thyroid nodule: Secondary | ICD-10-CM | POA: Diagnosis not present

## 2020-05-25 DIAGNOSIS — C541 Malignant neoplasm of endometrium: Secondary | ICD-10-CM | POA: Diagnosis not present

## 2020-05-25 DIAGNOSIS — Z5112 Encounter for antineoplastic immunotherapy: Secondary | ICD-10-CM | POA: Diagnosis not present

## 2020-05-25 DIAGNOSIS — Z510 Encounter for antineoplastic radiation therapy: Secondary | ICD-10-CM | POA: Diagnosis not present

## 2020-05-25 DIAGNOSIS — E039 Hypothyroidism, unspecified: Secondary | ICD-10-CM | POA: Diagnosis not present

## 2020-05-25 DIAGNOSIS — C770 Secondary and unspecified malignant neoplasm of lymph nodes of head, face and neck: Secondary | ICD-10-CM | POA: Diagnosis not present

## 2020-05-26 ENCOUNTER — Ambulatory Visit
Admission: RE | Admit: 2020-05-26 | Discharge: 2020-05-26 | Disposition: A | Discharge status: Home / Self Care | Payer: 59 | Source: Ambulatory Visit | Attending: Radiation Oncology | Admitting: Radiation Oncology

## 2020-05-26 ENCOUNTER — Other Ambulatory Visit: Payer: Self-pay

## 2020-05-26 DIAGNOSIS — Z510 Encounter for antineoplastic radiation therapy: Secondary | ICD-10-CM | POA: Diagnosis not present

## 2020-05-26 DIAGNOSIS — D61818 Other pancytopenia: Secondary | ICD-10-CM | POA: Diagnosis not present

## 2020-05-26 DIAGNOSIS — E041 Nontoxic single thyroid nodule: Secondary | ICD-10-CM | POA: Diagnosis not present

## 2020-05-26 DIAGNOSIS — C770 Secondary and unspecified malignant neoplasm of lymph nodes of head, face and neck: Secondary | ICD-10-CM | POA: Diagnosis not present

## 2020-05-26 DIAGNOSIS — G35 Multiple sclerosis: Secondary | ICD-10-CM | POA: Diagnosis not present

## 2020-05-26 DIAGNOSIS — E039 Hypothyroidism, unspecified: Secondary | ICD-10-CM | POA: Diagnosis not present

## 2020-05-26 DIAGNOSIS — Z5112 Encounter for antineoplastic immunotherapy: Secondary | ICD-10-CM | POA: Diagnosis not present

## 2020-05-26 DIAGNOSIS — Z7982 Long term (current) use of aspirin: Secondary | ICD-10-CM | POA: Diagnosis not present

## 2020-05-26 DIAGNOSIS — C541 Malignant neoplasm of endometrium: Secondary | ICD-10-CM | POA: Diagnosis not present

## 2020-05-27 ENCOUNTER — Inpatient Hospital Stay: Payer: 59

## 2020-05-27 ENCOUNTER — Encounter: Payer: Self-pay | Admitting: Hematology and Oncology

## 2020-05-27 ENCOUNTER — Other Ambulatory Visit: Payer: Self-pay

## 2020-05-27 ENCOUNTER — Ambulatory Visit
Admission: RE | Admit: 2020-05-27 | Discharge: 2020-05-27 | Disposition: A | Discharge status: Home / Self Care | Payer: 59 | Source: Ambulatory Visit | Attending: Radiation Oncology | Admitting: Radiation Oncology

## 2020-05-27 ENCOUNTER — Inpatient Hospital Stay (HOSPITAL_BASED_OUTPATIENT_CLINIC_OR_DEPARTMENT_OTHER): Payer: 59 | Admitting: Hematology and Oncology

## 2020-05-27 VITALS — BP 120/80 | HR 71 | Temp 98.3°F | Resp 18 | Ht 63.0 in | Wt 156.0 lb

## 2020-05-27 DIAGNOSIS — Z5112 Encounter for antineoplastic immunotherapy: Secondary | ICD-10-CM | POA: Diagnosis not present

## 2020-05-27 DIAGNOSIS — D61818 Other pancytopenia: Secondary | ICD-10-CM | POA: Diagnosis not present

## 2020-05-27 DIAGNOSIS — C541 Malignant neoplasm of endometrium: Secondary | ICD-10-CM | POA: Diagnosis not present

## 2020-05-27 DIAGNOSIS — Z7982 Long term (current) use of aspirin: Secondary | ICD-10-CM | POA: Diagnosis not present

## 2020-05-27 DIAGNOSIS — C770 Secondary and unspecified malignant neoplasm of lymph nodes of head, face and neck: Secondary | ICD-10-CM | POA: Diagnosis not present

## 2020-05-27 DIAGNOSIS — E039 Hypothyroidism, unspecified: Secondary | ICD-10-CM | POA: Diagnosis not present

## 2020-05-27 DIAGNOSIS — Z7189 Other specified counseling: Secondary | ICD-10-CM

## 2020-05-27 DIAGNOSIS — Z510 Encounter for antineoplastic radiation therapy: Secondary | ICD-10-CM | POA: Diagnosis not present

## 2020-05-27 DIAGNOSIS — E041 Nontoxic single thyroid nodule: Secondary | ICD-10-CM | POA: Diagnosis not present

## 2020-05-27 DIAGNOSIS — Z95828 Presence of other vascular implants and grafts: Secondary | ICD-10-CM

## 2020-05-27 DIAGNOSIS — G35 Multiple sclerosis: Secondary | ICD-10-CM | POA: Diagnosis not present

## 2020-05-27 LAB — CBC WITH DIFFERENTIAL/PLATELET
Abs Immature Granulocytes: 0.01 10*3/uL (ref 0.00–0.07)
Basophils Absolute: 0 10*3/uL (ref 0.0–0.1)
Basophils Relative: 1 %
Eosinophils Absolute: 0.2 10*3/uL (ref 0.0–0.5)
Eosinophils Relative: 4 %
HCT: 32.3 % — ABNORMAL LOW (ref 36.0–46.0)
Hemoglobin: 10.6 g/dL — ABNORMAL LOW (ref 12.0–15.0)
Immature Granulocytes: 0 %
Lymphocytes Relative: 20 %
Lymphs Abs: 0.8 10*3/uL (ref 0.7–4.0)
MCH: 31 pg (ref 26.0–34.0)
MCHC: 32.8 g/dL (ref 30.0–36.0)
MCV: 94.4 fL (ref 80.0–100.0)
Monocytes Absolute: 0.3 10*3/uL (ref 0.1–1.0)
Monocytes Relative: 7 %
Neutro Abs: 2.7 10*3/uL (ref 1.7–7.7)
Neutrophils Relative %: 68 %
Platelets: 155 10*3/uL (ref 150–400)
RBC: 3.42 MIL/uL — ABNORMAL LOW (ref 3.87–5.11)
RDW: 13.5 % (ref 11.5–15.5)
WBC: 3.9 10*3/uL — ABNORMAL LOW (ref 4.0–10.5)
nRBC: 0 % (ref 0.0–0.2)

## 2020-05-27 LAB — COMPREHENSIVE METABOLIC PANEL
ALT: 18 U/L (ref 0–44)
AST: 24 U/L (ref 15–41)
Albumin: 3.8 g/dL (ref 3.5–5.0)
Alkaline Phosphatase: 55 U/L (ref 38–126)
Anion gap: 9 (ref 5–15)
BUN: 28 mg/dL — ABNORMAL HIGH (ref 8–23)
CO2: 27 mmol/L (ref 22–32)
Calcium: 9.2 mg/dL (ref 8.9–10.3)
Chloride: 101 mmol/L (ref 98–111)
Creatinine, Ser: 1.03 mg/dL — ABNORMAL HIGH (ref 0.44–1.00)
GFR, Estimated: 60 mL/min — ABNORMAL LOW (ref >60)
Glucose, Bld: 97 mg/dL (ref 70–99)
Potassium: 4 mmol/L (ref 3.5–5.1)
Sodium: 137 mmol/L (ref 135–145)
Total Bilirubin: 0.6 mg/dL (ref 0.3–1.2)
Total Protein: 6.8 g/dL (ref 6.5–8.1)

## 2020-05-27 LAB — TSH: TSH: 1.562 u[IU]/mL (ref 0.308–3.960)

## 2020-05-27 MED ORDER — SODIUM CHLORIDE 0.9 % IV SOLN
Freq: Once | INTRAVENOUS | Status: AC
  Filled 2020-05-27: qty 250

## 2020-05-27 MED ORDER — SODIUM CHLORIDE 0.9% FLUSH
10.0000 mL | Freq: Once | INTRAVENOUS | Status: AC
  Administered 2020-05-27: 10 mL
  Filled 2020-05-27: qty 10

## 2020-05-27 MED ORDER — SODIUM CHLORIDE 0.9% FLUSH
10.0000 mL | INTRAVENOUS | Status: DC | PRN
  Administered 2020-05-27 (×2): 10 mL
  Filled 2020-05-27: qty 10

## 2020-05-27 MED ORDER — SODIUM CHLORIDE 0.9 % IV SOLN
200.0000 mg | Freq: Once | INTRAVENOUS | Status: AC
  Administered 2020-05-27: 200 mg via INTRAVENOUS
  Filled 2020-05-27: qty 8

## 2020-05-27 MED ORDER — HEPARIN SOD (PORK) LOCK FLUSH 100 UNIT/ML IV SOLN
500.0000 [IU] | Freq: Once | INTRAVENOUS | Status: AC | PRN
  Administered 2020-05-27: 500 [IU]
  Filled 2020-05-27: qty 5

## 2020-05-27 NOTE — Assessment & Plan Note (Signed)
She is tolerating treatment well She has some mild fatigue slightly worse than before with additional radiation treatment Continue supportive care for now Plan to wait at least 6 to 8 weeks after radiation treatment is completed before repeating another imaging study

## 2020-05-27 NOTE — Progress Notes (Signed)
[place] OFFICE PROGRESS NOTE  Patient Care Team: [name], MD as PCP - General (Family Medicine) Reynold Bowen, MD as Consulting Physician (Endocrinology)  ASSESSMENT & PLAN:  Endometrial cancer [facility]) She is tolerating treatment well She has some mild fatigue slightly worse than before with additional radiation treatment Continue supportive care for now Plan to wait at least 6 to 8 weeks after radiation treatment is completed before repeating another imaging study  Pancytopenia, acquired [facility]) This is due to side effects of treatment She is not symptomatic Observe only  Acquired hypothyroidism She has intermittent elevated TSH I will adjust her thyroid medicine accordingly   Orders Placed This Encounter  Procedures  . Comprehensive metabolic panel    Standing Status:   Standing    Number of Occurrences:   33    Standing Expiration Date:   05/27/2021  . CBC with Differential/Platelet    Standing Status:   Standing    Number of Occurrences:   22    Standing Expiration Date:   05/27/2021    All questions were answered. The patient knows to call the clinic with any problems, questions or concerns. The total time spent in the appointment was 20 minutes encounter with patients including review of chart and various tests results, discussions about plan of care and coordination of care plan   [name], MD 05/27/2020 4:43 PM  INTERVAL HISTORY: Please see below for problem oriented charting. She returns for further follow-up She has some mild worsening fatigue and some skin changes at the radiation site She is worried about her husband who was recently readmitted to the hospital for encephalopathy Her appetite is fair She has occasional loose bowel movement  SUMMARY OF ONCOLOGIC HISTORY: Oncology History Overview Note  [employer] testing done 11-2015 on surgical path from 2014: MS stable TMB low 4 muts/mb ATM [id number] ERBB3  T389K E2H2 rearrangement exon 9 PPP2R1A P179R TP53 I195N    ER- APPROXIMATELY 25-35% STAINING IN NEOPLASTIC CELLS (INTERMEDIATE)  PR- APPROXIMATELY 25-35% STAINING IN NEOPLASTIC CELLS (STRONG)  Repeat biopsy 04/02/17: ER negative, Her 2 negative  Progressed on Doxil   Endometrial cancer (Diane Lozano)  06/20/2012 Pathology Results   Biopsy positive for papillary serous carcinoma   06/20/2012 Genetic Testing   [employer] One testing done 11-2015 on surgical path from 2014: MS stable TMB low 4 muts/mb ATM [id number] ERBB3 T389K E2H2 rearrangement exon 9 PPP2R1A P179R TP53 I195N    06/20/2012 Initial Diagnosis   Patient presented to PCP with intermittent vaginal bleeding since ~ Oct 2013, endometrial biopsy 06-20-12 with complex endometrial hyperplasia with atypia   06/26/2012 Imaging   Thickened endometrial lining in a postmenopausal patient experiencing vaginal bleeding. In the setting of post-menopausal bleeding, endometrial sampling is indicated to exclude carcinoma. No focal myometrial abnormalities are seen.  Normal left ovary and non-visualized right ovary   07/30/2012 Surgery   Dr. Polly Cobia performed robotic hysterectomy with bilateral salpingo-oophorectomy, bilateral pelvic lymph node dissection and periaortic lymph node dissection. Intraoperatively on frozen section, the patient was noted to have a large endometrial polyp with changes within the polyp consistent for high-grade malignancy, possibly papillary serous carcinoma. There is no obvious extrauterine disease noted.     07/30/2012 Pathology Results   [phone] SUPPLEMENTAL REPORT  THE ENDOMETRIAL CARCINOMA WAS ANALYZED FOR DNA MISMATCH REPAIR PROTEINS.IMMUNOHISTOCHEMICALLY, THE NEOPLASM RETAINED NUCLEAR EXPRESSION OF 4 GENE PRODUCTS, MLH1, MSH2, MSH6, AND PMS2, INVOLVED IN DNA MISMATCH REPAIR.POSITIVE AND NEGATIVE CONTROLS WORKED APPROPRIATELY.  PER REQUEST, AN ER AND  PR ARE PERFORMED ON BLOCK 1I.   ER- APPROXIMATELY 25-35% STAINING IN NEOPLASTIC CELLS (INTERMEDIATE)  PR- APPROXIMATELY 25-35% STAINING IN NEOPLASTIC CELLS (STRONG)  ER AND PR PREDOMINANTLY SHOW STAINING IN THE SEROUS COMPONENT.  DIAGNOSIS  1. UTERUS, CERVIX WITH BILATERAL FALLOPIAN TUBES AND OVARIES,  HYSTERECTOMY AND BILATERAL SALPINGO-OOPHORECTOMY:  HIGH GRADE/POORLY DIFFERENTIATED ENDOMETRIAL ADENOCARCINOMA WITH SOLID AND SEROUS PAPILLARY COMPONENTS.  HISTOPATHOLOGIC TYPE:A VARIETY OF PATTERNS WERE PRESENT, ALL OF WHICH SHOULD BE CONSIDERED TO BE HIGH GRADE.SEROUS PAPILLARY CARCINOMA WAS SEEN OCCURRING ADJACENT TO SOLID ENDOMETRIAL CARCINOMA WITH MARKED ANAPLASIA AND GIANT CELLS. SIZE:TUMOR MEASURED AT LEAST 4.8 CM IN GREATEST HORIZONTAL DIMENSION.  GRADE: POORLY DIFFERENTIATED OR HIGH GRADE. DEPTH OF INVASION: NO DEFINITE MYOMETRIAL INVOLVEMENT WAS SEEN.THE TUMOR APPEARED CONFINED TO THE POLYP AS WELL AS SURFACE ENDOMETRIUM. WHERE MYOMETRIAL THICKNESS NOT APPLICABLE. SEROSAL INVOLVEMENT: NOT DEMONSTRATED.  ENDOCERVICAL INVOLVEMENT:NOT DEMONSTRATED. RESECTION MARGINS: FREE OF INVOLVEMENT. EXTRAUTERINE EXTENSION:NOT DEMONSTRATED. ANGIOLYMPHATIC INVASION: NOT DEFINITELY SEEN. TOTAL NODES EXAMINED:22.  PELVIC NODES EXAMINED:20.  PELVIC NODES INVOLVED:0.  PARA-AORTIC NODES 2. EXAMINED:  PARA-AORTIC NODES 0. INVOLVED TNM STAGE: T1A N0 MX. AJCC STAGE GROUPING: IA. FIGO STAGE:IA.   08/26/2012 Imaging   No CT evidence for intra-abdominal or pelvic metastatic disease. Trace free pelvic fluid, presumably postoperative although the date of surgery is not documented in the electronic medical record.   08/26/2012 - 12/13/2012 Chemotherapy   She received 6 cycles of carbo/taxol    10/29/2012 - 11/27/2012 Radiation Therapy   May 27, June 5,  June 11, June 19, November 27, 2012: Proximal vagina 30 Gy in 5 fractions     01/17/2013  Imaging   1.  No evidence of recurrent or metastatic disease. 2.  No acute abnormality involving the abdomen or pelvis. 3.  Mild diffuse hepatic steatosis. 4.  Very small supraumbilical midline anterior abdominal wall hernia containing fat, unchanged   01/22/2014 Imaging   No evidence for metastatic or recurrent disease. 2. No bowel obstruction.  Normal appendix. 3. Small fat containing hernia, stable in appearance. 4. Status post hysterectomy and bilateral oophorectomy   02/19/2014 Imaging   No pulmonary lesions are identified. The abnormality on the chest x-ray is due to asymmetric left-sided sternoclavicular joint degenerative disease   10/12/2014 Imaging   New mild retroperitoneal lymphadenopathy in the left paraaortic region and proximal left common iliac chain, consistent with metastatic disease. No other sites of metastatic disease identified within the abdomen or pelvis.     11/27/2014 - 02/11/2015 Chemotherapy   She received 4 cycles of carbo/taxol    03/01/2015 PET scan   Single hypermetabolic small retroperitoneal lymph node along the aorta. 2. No evidence of metastatic disease otherwise in the abdomen or pelvis. No evidence local recurrence. 3. Intensely hypermetabolic enlarged nodule adjacent to the RIGHT lobe of thyroid gland. This presumably represents the biopsied lesion in clinician report which was found to be benign thyroid tissue.   04/05/2015 - 05/13/2015 Radiation Therapy   She received 50.4 gray in 28 fractions with simultaneous integrated boost to 56 gray   06/17/2015 Imaging   No acute process or evidence of metastatic disease in the abdomen or pelvis. Resolution of previously described retroperitoneal adenopathy. 2.  Possible constipation. 3. Atherosclerosis.   06/17/2015 Tumor Marker   Patient's tumor was tested for the following markers: CA125 Results of the tumor marker test revealed 33   08/12/2015 Tumor Marker   Patient's tumor was tested for the following markers:  CA125 Results of the tumor marker test revealed 52  08/31/2015 PET scan   Development of right paratracheal hypermetabolic adenopathy, consistent with nodal metastasis. 2. The previously described isolated abdominal retroperitoneal hypermetabolic node has resolved. 3. Persistent hypermetabolic right thyroid nodule, per report previously biopsied. Correlate with those results. 4.  Possible constipation.   09/09/2015 -  Anti-estrogen oral therapy   She has been receiving alternative treatment between megace and tamoxifen   09/09/2015 Tumor Marker   Patient's tumor was tested for the following markers: CA125 Results of the tumor marker test revealed 37.8   09/20/2015 Procedure   Technically successful ultrasound-guided thyroid aspiration biopsy , dominant right nodule   09/20/2015 Pathology Results   THYROID, RIGHT, FINE NEEDLE ASPIRATION (SPECIMEN 1 OF 1 COLLECTED 09/20/15): FINDINGS CONSISTENT WITH BENIGN THYROID NODULE (BETHESDA CATEGORY II).   10/28/2015 Tumor Marker   Patient's tumor was tested for the following markers: CA125 Results of the tumor marker test revealed 53.2   11/04/2015 Imaging   Stable benign right thyroid nodule   12/16/2015 Tumor Marker   Patient's tumor was tested for the following markers: CA125 Results of the tumor marker test revealed 38.1   03/22/2016 PET scan   Interval progression of hypermetabolic right paratracheal lymph node consistent with metastatic involvement. 2. Stable hypermetabolic right thyroid nodule. Reportedly this has been biopsied in the past.   03/23/2016 Tumor Marker   Patient's tumor was tested for the following markers: CA125 Results of the tumor marker test revealed 62.4   04/13/2016 - 06/01/2016 Radiation Therapy   She received 56 Gy to the chest in 28 fractions    05/09/2016 Imaging   No evidence of left lower extremity deep vein thrombosis. No evidence of a superficial thrombosis of the greater and lesser saphenous veins. Positive for  thrombus noted in several varicosities of the calf. No evidence of Baker's cyst on the left.   05/17/2016 Tumor Marker   Patient's tumor was tested for the following markers: CA125 Results of the tumor marker test revealed 9   06/29/2016 Tumor Marker   Patient's tumor was tested for the following markers: CA125 Results of the tumor marker test revealed 5.9   08/04/2016 Tumor Marker   Patient's tumor was tested for the following markers: CA125 Results of the tumor marker test revealed 6.0   09/12/2016 PET scan   Complete metabolic response to therapy, with resolution of hypermetabolic mediastinal lymphadenopathy since prior exam. No residual or new metastatic disease identified. Stable hypermetabolic right thyroid lobe nodule, which was previously biopsied on 09/20/2015.   03/13/2017 PET scan   1. Solitary focus of recurrent right paratracheal hypermetabolic activity, with a 1.0 cm right lower paratracheal node having a maximum SUV of 9.0. Appearance compatible with recurrent malignancy. 2. Continued hypermetabolic right thyroid nodule, previously biopsied, presumed benign -correlate with prior biopsy results. 3. Other imaging findings of potential clinical significance: Aortic Atherosclerosis (ICD10-I70.0). Mild cardiomegaly. Prominent stool throughout the colon favors constipation.   04/02/2017 Pathology Results   FINE NEEDLE ASPIRATION, ENDOSCOPIC, (EBUS) 4 R NODE (SPECIMEN 1 OF 2 COLLECTED 04/02/17): MALIGNANT CELLS PRESENT, CONSISTENT WITH CARCINOMA. SEE COMMENT. COMMENT: THE MALIGNANT CELLS ARE POSITIVE FOR P53 AND NEGATIVE FOR ESTROGEN RECEPTOR AND TTF-1. THIS PROFILE IS NON-SPECIFIC, BUT THE P53 POSITIVE STAINING IS SUGGESTIVE OF GYNECOLOGIC PRIMARY.    04/11/2017 Procedure   Successful 8 French right internal jugular vein power port placement with its tip at the SVC/RA junction   04/12/2017 Imaging   Normal LV size with mild LV hypertrophy. EF 55-60%. Normal RV size and systolic  function.  Aortic valve sclerosis without significant stenosis.   04/18/2017 - 06/14/2017 Chemotherapy   The patient had chemotherapy with Doxil    07/10/2017 Imaging   - Left ventricle: The cavity size was normal. There was mild concentric hypertrophy. Systolic function was normal. The estimated ejection fraction was in the range of 55% to 60%. Wall motion was normal; there were no regional wall motion abnormalities. Doppler parameters are consistent with abnormal left ventricular relaxation (grade 1 diastolic dysfunction). - Aortic valve: Noncoronary cusp mobility was mildly restricted. - Mitral valve: There was mild regurgitation. - Left atrium: The atrium was mildly dilated. - Atrial septum: No defect or patent foramen ovale was identified.  Impressions:  - Compared to November 2018, global LV longitudinal strain remains normal (has increased)   07/11/2017 PET scan   Increased size and hypermetabolic activity of 3.3 cm right thyroid lobe nodule. Thyroid carcinoma cannot be excluded. Recommend repeat ultrasound guided fine needle aspiration to exclude thyroid carcinoma.  New adjacent hypermetabolic 10 mm right supraclavicular lymph node, suspicious for lymph node metastasis.  Slight increase in size and hypermetabolic activity of solitary right paratracheal lymph node.  No evidence of abdominal or pelvic metastatic disease.   07/18/2017 Procedure   1. Technically successful ultrasound guided fine needle aspiration of indeterminate hypermetabolic right-sided thyroid nodule/mass. 2. Technically successful ultrasound-guided core needle biopsy of hypermetabolic right lower cervical lymph node.   07/18/2017 Pathology Results   THYROID, FINE NEEDLE ASPIRATION RIGHT (SPECIMEN 1 OF 1, COLLECTED ON 07/18/2017): ATYPIA OF UNDETERMINED SIGNIFICANCE OR FOLLICULAR LESION OF UNDETERMINED SIGNIFICANCE (BETHESDA CATEGORY III). SEE COMMENT. COMMENT: THE SPECIMEN CONSISTS OF SMALL AND MEDIUM SIZED GROUPS  OF FOLLICULAR EPITHELIAL CELLS WITH MILD CYTOLOGIC ATYPIA INCLUDING NUCLEAR ENLARGEMENT AND HURTHLE CELL CHANGE. SOME GROUPS ARE ARRANGED AS MICROFOLLICLES. THERE IS MINIMAL BACKGROUND COLLOID. BASED ON THESE FEATURES, A FOLLICULAR LESION/NEOPLASM CAN NOT BE ENTIRELY RULED OUT. A SPECIMEN WILL BE SENT FOR AFIRMA TESTING.   07/19/2017 Pathology Results   Lymph node, needle/core biopsy - METASTATIC PAPILLARY SEROUS CARCINOMA. - SEE COMMENT. Microscopic Comment Dr. Valinda Hoar has reviewed the case and concurs with this interpretation   10/15/2017 PET scan   No new or progressive disease. No evidence of abdominal or pelvic metastatic disease.  Stable hypermetabolic right thyroid lobe nodule and adjacent right supraclavicular lymph node.  Decreased size and hypermetabolic activity of solitary right paratracheal lymph node.   01/23/2018 PET scan   1. Overall, no significant change from PET-CT of 3 months ago. There is persistent hypermetabolic activity at the right thoracic inlet and within a right paratracheal mediastinal node. The nodes have not significantly changed in size, although the metabolic activity has minimally increased over this interval. It is uncertain how much of the thoracic inlet metabolic activity is attributed to the thyroid nodule versus adjacent cervical lymph nodes, both previously biopsied. 2. No new hypermetabolic activity within the neck, chest, abdomen or pelvis. No evidence of local recurrence in the pelvis. 3. Stable probable radiation changes in the right lung.   04/16/2018 PET scan   1. Interval increase in metabolic activity of the RIGHT supraclavicular node and RIGHT lower paratracheal mediastinal node with minimal change in size. Findings consistent with persistent and mildly progressed metastatic adenopathy. 2. New hypermetabolic small lymph node in the RIGHT lower paratracheal nodal station adjacent to the previously followed node. 3. No evidence of local  recurrence in the pelvis or new disease elsewhere.   07/30/2018 PET scan   1. Mild interval progression of right supraclavicular,  mediastinal, and right hilar hypermetabolic metastatic disease. There is a new hypermetabolic lymph node in the subcarinal station on today's study. 2. No hypermetabolic disease in the abdomen or pelvis to suggest recurrent/metastases. 3.  Aortic Atherosclerois (ICD10-170.0)    08/16/2018 -  Chemotherapy   The patient had Lenvima since 3/6 and pembrolizumab since 3/13 for chemotherapy treatment. Michel Santee was held temporarily due to infection and severe hypertension   10/31/2018 PET scan   Partial response to therapy, with decreased hypermetabolic lymphadenopathy in mediastinum and right hilar region.  No significant change in hypermetabolic lymphadenopathy in right inferior neck.  No new or progressive metastatic disease identified. No evidence of recurrent or metastatic carcinoma in the pelvis or abdomen   02/04/2019 PET scan   1. Decrease in metabolic activity of RIGHT supraclavicular node and RIGHT paratracheal lymph nodes. 2. Persistent activity in the RIGHT thyroid nodule unchanged. 3. Diffuse activity through the esophagus is favored esophagitis. No change. 4. Scattered foci of intense metabolic activity associated the bowel without focal lesion on CT favored benign. 5. No evidence of peritoneal metastasis. 6. No evidence of metastatic adenopathy in the abdomen pelvis. 7. No evidence of local pelvic sidewall recurrence.   07/30/2019 PET scan   1. Mildly increased uptake within small nodes along the right sternocleidomastoid without change in size and associated with increased deltoid activity may reflect changes related to right arm injection or recent COVID vaccination, consider clinical correlation and attention on follow-up. 2. Persistent activity in the right thyroid nodule, unchanged from previous exams. 3. New activity in a juxta esophageal lymph  node in the chest is suspicious though the node is unchanged with respect to size. Endoscopic correlation could potentially be helpful or close attention on follow-up. 4. Subtle increased FDG uptake along the left mainstem bronchus and adjacent to the aorta may represent a small lymph node. No discrete correlate is demonstrated on today's study.   10/22/2019 PET scan   1. Persistent FDG avid subcentimeter right supraclavicular and juxta esophageal lymph nodes. The degree of FDG uptake is similar to the previous exam. No new or progressive findings identified. 2. No findings of solid organ metastasis, skeletal metastasis or metastatic disease to the abdomen or pelvis. 3. FDG avid right lobe of thyroid gland nodule is again noted This nodule is not incidental and been worked up previously with 2 biopsies. Please refer to results from the most recent biopsy dated 07/18/2017. (Ref: J Am Coll Radiol. 2015 Feb;12(2): 143-50). 4.  Aortic Atherosclerosis (ICD10-I70.0).   05/05/2020 PET scan   1. Progressive disease, as evidenced by increased hypermetabolism within right supraclavicular and mediastinal nodes. 2. No infradiaphragmatic hypermetabolic metastasis. 3.  Aortic Atherosclerosis (ICD10-I70.0). 4. 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)  07/11/2017 Initial Diagnosis   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[place])   08/16/2018 -  Chemotherapy   The patient had pembrolizumab (KEYTRUDA) 200 mg in sodium chloride 0.9 % 50 mL chemo infusion, 200 mg, Intravenous, Once, 27 of 28 cycles Administration: 200 mg (08/16/2018), 200 mg (09/06/2018), 200 mg (09/30/2018), 200 mg (10/21/2018), 200 mg (11/11/2018), 200 mg (12/02/2018), 200 mg (12/23/2018), 200 mg (01/13/2019), 200 mg (02/05/2019), 200 mg (02/27/2019), 200 mg (03/20/2019), 200 mg (04/10/2019), 200 mg (05/08/2019), 200 mg (05/29/2019), 200 mg (06/19/2019), 200  mg (07/10/2019), 200 mg (07/31/2019), 200 mg (08/21/2019), 200 mg (09/11/2019), 200 mg (10/02/2019), 200 mg (10/23/2019), 200 mg (11/13/2019), 200 mg (12/04/2019), 200  mg (12/25/2019), 200 mg (01/15/2020), 200 mg (02/05/2020), 200 mg (02/26/2020)  for chemotherapy treatment.      REVIEW OF SYSTEMS:   Constitutional: Denies fevers, chills or abnormal weight loss Eyes: Denies blurriness of vision Ears, nose, mouth, throat, and face: Denies mucositis or sore throat Respiratory: Denies cough, dyspnea or wheezes Cardiovascular: Denies palpitation, chest discomfort or lower extremity swelling Gastrointestinal:  Denies nausea, heartburn or change in bowel habits Lymphatics: Denies new lymphadenopathy or easy bruising Neurological:Denies numbness, tingling or new weaknesses Behavioral/Psych: Mood is stable, no new changes  All other systems were reviewed with the patient and are negative.  I have reviewed the past medical history, past surgical history, social history and family history with the patient and they are unchanged from previous note.  ALLERGIES:  is allergic to other.  MEDICATIONS:  Current Outpatient Medications  Medication Sig Dispense Refill  . amLODipine (NORVASC) 10 MG tablet TAKE 1 TABLET BY MOUTH ONCE DAILY. **DECREASE SIMVASTATIN TO 20 MG DAILY WHILE ON AMLODIPINE PER MD** 90 tablet 1  . aspirin 81 MG EC tablet Take by mouth. (Patient not taking: Reported on 05/12/2020)    . busPIRone (BUSPAR) 10 MG tablet Take 10 mg by mouth 2 (two) times daily.    . Calcium Carb-Cholecalciferol 500-600 MG-UNIT TABS Take 1 tablet by mouth daily.     . Cholecalciferol (VITAMIN D3) 2000 units TABS Take 2,000 Units by mouth daily.     . hydrochlorothiazide (MICROZIDE) 12.5 MG capsule Take 12.5 mg by mouth daily.    Marland Kitchen lenvatinib 10 mg daily dose (LENVIMA, 10 MG DAILY DOSE,) capsule Take 1 capsule (10 mg total) by mouth daily. 30 capsule 11  . levothyroxine (SYNTHROID) 75 MCG tablet Take 1 tablet (75 mcg total)  by mouth daily before breakfast.    . lidocaine-prilocaine (EMLA) cream APPLY TO AFFECTED AREA ONCE AS DIRECTED 30 g 3  . LORazepam (ATIVAN) 0.5 MG tablet Take 0.5 mg by mouth 2 (two) times daily as needed. (Patient not taking: Reported on 05/12/2020)  0  . losartan (COZAAR) 25 MG tablet Take 1 tablet (25 mg total) by mouth daily. 30 tablet 11  . metoprolol tartrate (LOPRESSOR) 25 MG tablet TAKE 1 TABLET (25 MG TOTAL) BY MOUTH 2 (TWO) TIMES DAILY. 180 tablet 3  . ondansetron (ZOFRAN) 8 MG tablet Take 1 tablet (8 mg total) by mouth every 8 (eight) hours as needed for nausea. (Patient not taking: Reported on 05/12/2020) 30 tablet 3  . pantoprazole (PROTONIX) 40 MG tablet Take 1 tablet (40 mg total) by mouth daily. 90 tablet 3  . simvastatin (ZOCOR) 20 MG tablet SMARTSIG:1 Tablet(s) By Mouth Every Evening    . tacrolimus (PROTOPIC) 0.1 % ointment Apply 1 application topically daily as needed (rash).     . venlafaxine XR (EFFEXOR-XR) 150 MG 24 hr capsule Take 300 mg by mouth every morning.     No current facility-administered medications for this visit.   Facility-Administered Medications Ordered in Other Visits  Medication Dose Route Frequency Provider Last Rate Last Admin  . sodium chloride flush (NS) 0.9 % injection 10 mL  10 mL Intracatheter PRN Alvy Bimler, Soraiya Ahner, MD   10 mL at 05/27/20 1451    PHYSICAL EXAMINATION: ECOG PERFORMANCE STATUS: 1 - Symptomatic but completely ambulatory  Vitals:   05/27/20 1225  BP: 120/80  Pulse: 71  Resp: 18  Temp: 98.3 F (36.8 C)  SpO2: 96%   Filed Weights   05/27/20 1225  Weight: 156 lb (70.8 kg)  GENERAL:alert, no distress and comfortable SKIN: Noted some skin rash on the right side of her supraclavicular region consistent with mild radiation-induced skin injury  NEURO: alert & oriented x 3 with fluent speech, no focal motor/sensory deficits  LABORATORY DATA:  I have reviewed the data as listed    Component Value Date/Time   NA 137 05/27/2020  1211   NA 141 05/17/2017 0957   K 4.0 05/27/2020 1211   K 3.6 05/17/2017 0957   CL 101 05/27/2020 1211   CL 105 11/19/2012 0848   CO2 27 05/27/2020 1211   CO2 25 05/17/2017 0957   GLUCOSE 97 05/27/2020 1211   GLUCOSE 85 05/17/2017 0957   GLUCOSE 122 (H) 11/19/2012 0848   BUN 28 (H) 05/27/2020 1211   BUN 15.2 05/17/2017 0957   CREATININE 1.03 (H) 05/27/2020 1211   CREATININE 0.83 07/10/2019 1240   CREATININE 0.8 05/17/2017 0957   CALCIUM 9.2 05/27/2020 1211   CALCIUM 9.6 05/17/2017 0957   PROT 6.8 05/27/2020 1211   PROT 7.0 05/17/2017 0957   ALBUMIN 3.8 05/27/2020 1211   ALBUMIN 3.9 05/17/2017 0957   AST 24 05/27/2020 1211   AST 23 07/10/2019 1240   AST 25 05/17/2017 0957   ALT 18 05/27/2020 1211   ALT 20 07/10/2019 1240   ALT 18 05/17/2017 0957   ALKPHOS 55 05/27/2020 1211   ALKPHOS 57 05/17/2017 0957   BILITOT 0.6 05/27/2020 1211   BILITOT 0.4 07/10/2019 1240   BILITOT 0.47 05/17/2017 0957   GFRNONAA 60 (L) 05/27/2020 1211   GFRNONAA >60 07/10/2019 1240   GFRAA >60 02/26/2020 0940   GFRAA >60 07/10/2019 1240    No results found for: SPEP, UPEP  Lab Results  Component Value Date   WBC 3.9 (L) 05/27/2020   NEUTROABS 2.7 05/27/2020   HGB 10.6 (L) 05/27/2020   HCT 32.3 (L) 05/27/2020   MCV 94.4 05/27/2020   PLT 155 05/27/2020      Chemistry      Component Value Date/Time   NA 137 05/27/2020 1211   NA 141 05/17/2017 0957   K 4.0 05/27/2020 1211   K 3.6 05/17/2017 0957   CL 101 05/27/2020 1211   CL 105 11/19/2012 0848   CO2 27 05/27/2020 1211   CO2 25 05/17/2017 0957   BUN 28 (H) 05/27/2020 1211   BUN 15.2 05/17/2017 0957   CREATININE 1.03 (H) 05/27/2020 1211   CREATININE 0.83 07/10/2019 1240   CREATININE 0.8 05/17/2017 0957   GLU 158 (H) 01/14/2015 1035      Component Value Date/Time   CALCIUM 9.2 05/27/2020 1211   CALCIUM 9.6 05/17/2017 0957   ALKPHOS 55 05/27/2020 1211   ALKPHOS 57 05/17/2017 0957   AST 24 05/27/2020 1211   AST 23 07/10/2019  1240   AST 25 05/17/2017 0957   ALT 18 05/27/2020 1211   ALT 20 07/10/2019 1240   ALT 18 05/17/2017 0957   BILITOT 0.6 05/27/2020 1211   BILITOT 0.4 07/10/2019 1240   BILITOT 0.47 05/17/2017 0957

## 2020-05-27 NOTE — Patient Instructions (Signed)

## 2020-05-27 NOTE — Patient Instructions (Signed)
[facility] Discharge Instructions for Patients Receiving Chemotherapy  Today you received the following chemotherapy agents: pembrolizumab.  To help prevent nausea and vomiting after your treatment, we encourage you to take your nausea medication as directed.   If you develop nausea and vomiting that is not controlled by your nausea medication, call the clinic.   BELOW ARE SYMPTOMS THAT SHOULD BE REPORTED IMMEDIATELY:  *FEVER GREATER THAN 100.5 F  *CHILLS WITH OR WITHOUT FEVER  NAUSEA AND VOMITING THAT IS NOT CONTROLLED WITH YOUR NAUSEA MEDICATION  *UNUSUAL SHORTNESS OF BREATH  *UNUSUAL BRUISING OR BLEEDING  TENDERNESS IN MOUTH AND THROAT WITH OR WITHOUT PRESENCE OF ULCERS  *URINARY PROBLEMS  *BOWEL PROBLEMS  UNUSUAL RASH Items with * indicate a potential emergency and should be followed up as soon as possible.  Feel free to call the clinic should you have any questions or concerns. The clinic phone number is [phone].  Please show the CHEMO ALERT CARD at check-in to the Emergency Department and triage nurse.   

## 2020-05-27 NOTE — Assessment & Plan Note (Signed)
This is due to side effects of treatment She is not symptomatic Observe only 

## 2020-05-27 NOTE — Progress Notes (Signed)
Per Dr. Alvy Bimler, proceed with treatment before CMP results.

## 2020-05-27 NOTE — Assessment & Plan Note (Signed)
She has intermittent elevated TSH I will adjust her thyroid medicine accordingly 

## 2020-05-31 ENCOUNTER — Ambulatory Visit
Admission: RE | Admit: 2020-05-31 | Discharge: 2020-05-31 | Disposition: A | Discharge status: Home / Self Care | Payer: 59 | Source: Ambulatory Visit | Attending: Radiation Oncology | Admitting: Radiation Oncology

## 2020-05-31 DIAGNOSIS — D61818 Other pancytopenia: Secondary | ICD-10-CM | POA: Diagnosis not present

## 2020-05-31 DIAGNOSIS — C541 Malignant neoplasm of endometrium: Secondary | ICD-10-CM | POA: Diagnosis not present

## 2020-05-31 DIAGNOSIS — Z7982 Long term (current) use of aspirin: Secondary | ICD-10-CM | POA: Diagnosis not present

## 2020-05-31 DIAGNOSIS — Z510 Encounter for antineoplastic radiation therapy: Secondary | ICD-10-CM | POA: Diagnosis not present

## 2020-05-31 DIAGNOSIS — E039 Hypothyroidism, unspecified: Secondary | ICD-10-CM | POA: Diagnosis not present

## 2020-05-31 DIAGNOSIS — Z5112 Encounter for antineoplastic immunotherapy: Secondary | ICD-10-CM | POA: Diagnosis not present

## 2020-05-31 DIAGNOSIS — C770 Secondary and unspecified malignant neoplasm of lymph nodes of head, face and neck: Secondary | ICD-10-CM | POA: Diagnosis not present

## 2020-05-31 DIAGNOSIS — E041 Nontoxic single thyroid nodule: Secondary | ICD-10-CM | POA: Diagnosis not present

## 2020-05-31 DIAGNOSIS — G35 Multiple sclerosis: Secondary | ICD-10-CM | POA: Diagnosis not present

## 2020-06-01 ENCOUNTER — Other Ambulatory Visit: Payer: Self-pay

## 2020-06-01 ENCOUNTER — Ambulatory Visit
Admission: RE | Admit: 2020-06-01 | Discharge: 2020-06-01 | Disposition: A | Discharge status: Home / Self Care | Payer: 59 | Source: Ambulatory Visit | Attending: Radiation Oncology | Admitting: Radiation Oncology

## 2020-06-01 DIAGNOSIS — C770 Secondary and unspecified malignant neoplasm of lymph nodes of head, face and neck: Secondary | ICD-10-CM | POA: Diagnosis not present

## 2020-06-01 DIAGNOSIS — C541 Malignant neoplasm of endometrium: Secondary | ICD-10-CM | POA: Diagnosis not present

## 2020-06-01 DIAGNOSIS — G35 Multiple sclerosis: Secondary | ICD-10-CM | POA: Diagnosis not present

## 2020-06-01 DIAGNOSIS — Z7982 Long term (current) use of aspirin: Secondary | ICD-10-CM | POA: Diagnosis not present

## 2020-06-01 DIAGNOSIS — Z510 Encounter for antineoplastic radiation therapy: Secondary | ICD-10-CM | POA: Diagnosis not present

## 2020-06-01 DIAGNOSIS — D61818 Other pancytopenia: Secondary | ICD-10-CM | POA: Diagnosis not present

## 2020-06-01 DIAGNOSIS — Z5112 Encounter for antineoplastic immunotherapy: Secondary | ICD-10-CM | POA: Diagnosis not present

## 2020-06-01 DIAGNOSIS — E039 Hypothyroidism, unspecified: Secondary | ICD-10-CM | POA: Diagnosis not present

## 2020-06-01 DIAGNOSIS — E041 Nontoxic single thyroid nodule: Secondary | ICD-10-CM | POA: Diagnosis not present

## 2020-06-02 ENCOUNTER — Telehealth: Payer: Self-pay

## 2020-06-02 ENCOUNTER — Other Ambulatory Visit: Payer: Self-pay

## 2020-06-02 ENCOUNTER — Ambulatory Visit
Admission: RE | Admit: 2020-06-02 | Discharge: 2020-06-02 | Disposition: A | Discharge status: Home / Self Care | Payer: 59 | Source: Ambulatory Visit | Attending: Radiation Oncology | Admitting: Radiation Oncology

## 2020-06-02 DIAGNOSIS — Z510 Encounter for antineoplastic radiation therapy: Secondary | ICD-10-CM | POA: Diagnosis not present

## 2020-06-02 DIAGNOSIS — Z5112 Encounter for antineoplastic immunotherapy: Secondary | ICD-10-CM | POA: Diagnosis not present

## 2020-06-02 DIAGNOSIS — G35 Multiple sclerosis: Secondary | ICD-10-CM | POA: Diagnosis not present

## 2020-06-02 DIAGNOSIS — E039 Hypothyroidism, unspecified: Secondary | ICD-10-CM | POA: Diagnosis not present

## 2020-06-02 DIAGNOSIS — E041 Nontoxic single thyroid nodule: Secondary | ICD-10-CM | POA: Diagnosis not present

## 2020-06-02 DIAGNOSIS — C541 Malignant neoplasm of endometrium: Secondary | ICD-10-CM | POA: Diagnosis not present

## 2020-06-02 DIAGNOSIS — D61818 Other pancytopenia: Secondary | ICD-10-CM | POA: Diagnosis not present

## 2020-06-02 DIAGNOSIS — C770 Secondary and unspecified malignant neoplasm of lymph nodes of head, face and neck: Secondary | ICD-10-CM | POA: Diagnosis not present

## 2020-06-02 DIAGNOSIS — Z7982 Long term (current) use of aspirin: Secondary | ICD-10-CM | POA: Diagnosis not present

## 2020-06-02 NOTE — Telephone Encounter (Signed)
Error

## 2020-06-03 ENCOUNTER — Ambulatory Visit
Admission: RE | Admit: 2020-06-03 | Discharge: 2020-06-03 | Disposition: A | Discharge status: Home / Self Care | Payer: 59 | Source: Ambulatory Visit | Attending: Radiation Oncology | Admitting: Radiation Oncology

## 2020-06-03 DIAGNOSIS — Z5112 Encounter for antineoplastic immunotherapy: Secondary | ICD-10-CM | POA: Diagnosis not present

## 2020-06-03 DIAGNOSIS — D61818 Other pancytopenia: Secondary | ICD-10-CM | POA: Diagnosis not present

## 2020-06-03 DIAGNOSIS — G35 Multiple sclerosis: Secondary | ICD-10-CM | POA: Diagnosis not present

## 2020-06-03 DIAGNOSIS — E041 Nontoxic single thyroid nodule: Secondary | ICD-10-CM | POA: Diagnosis not present

## 2020-06-03 DIAGNOSIS — Z7982 Long term (current) use of aspirin: Secondary | ICD-10-CM | POA: Diagnosis not present

## 2020-06-03 DIAGNOSIS — C770 Secondary and unspecified malignant neoplasm of lymph nodes of head, face and neck: Secondary | ICD-10-CM | POA: Diagnosis not present

## 2020-06-03 DIAGNOSIS — Z510 Encounter for antineoplastic radiation therapy: Secondary | ICD-10-CM | POA: Diagnosis not present

## 2020-06-03 DIAGNOSIS — C541 Malignant neoplasm of endometrium: Secondary | ICD-10-CM | POA: Diagnosis not present

## 2020-06-03 DIAGNOSIS — E039 Hypothyroidism, unspecified: Secondary | ICD-10-CM | POA: Diagnosis not present

## 2020-06-07 ENCOUNTER — Other Ambulatory Visit: Payer: Self-pay

## 2020-06-07 ENCOUNTER — Ambulatory Visit
Admission: RE | Admit: 2020-06-07 | Discharge: 2020-06-07 | Disposition: A | Discharge status: Home / Self Care | Payer: 59 | Source: Ambulatory Visit | Attending: Radiation Oncology | Admitting: Radiation Oncology

## 2020-06-07 DIAGNOSIS — C541 Malignant neoplasm of endometrium: Secondary | ICD-10-CM | POA: Insufficient documentation

## 2020-06-07 DIAGNOSIS — E039 Hypothyroidism, unspecified: Secondary | ICD-10-CM | POA: Insufficient documentation

## 2020-06-07 DIAGNOSIS — Z90710 Acquired absence of both cervix and uterus: Secondary | ICD-10-CM | POA: Insufficient documentation

## 2020-06-07 DIAGNOSIS — Z510 Encounter for antineoplastic radiation therapy: Secondary | ICD-10-CM | POA: Insufficient documentation

## 2020-06-07 DIAGNOSIS — C770 Secondary and unspecified malignant neoplasm of lymph nodes of head, face and neck: Secondary | ICD-10-CM | POA: Diagnosis not present

## 2020-06-07 DIAGNOSIS — G35 Multiple sclerosis: Secondary | ICD-10-CM | POA: Insufficient documentation

## 2020-06-07 DIAGNOSIS — Z79899 Other long term (current) drug therapy: Secondary | ICD-10-CM | POA: Insufficient documentation

## 2020-06-07 DIAGNOSIS — E041 Nontoxic single thyroid nodule: Secondary | ICD-10-CM | POA: Insufficient documentation

## 2020-06-07 DIAGNOSIS — D61818 Other pancytopenia: Secondary | ICD-10-CM | POA: Insufficient documentation

## 2020-06-07 DIAGNOSIS — Z90722 Acquired absence of ovaries, bilateral: Secondary | ICD-10-CM | POA: Insufficient documentation

## 2020-06-07 DIAGNOSIS — Z923 Personal history of irradiation: Secondary | ICD-10-CM | POA: Insufficient documentation

## 2020-06-07 DIAGNOSIS — Z5112 Encounter for antineoplastic immunotherapy: Secondary | ICD-10-CM | POA: Insufficient documentation

## 2020-06-07 DIAGNOSIS — Z7982 Long term (current) use of aspirin: Secondary | ICD-10-CM | POA: Insufficient documentation

## 2020-06-08 ENCOUNTER — Other Ambulatory Visit: Payer: Self-pay

## 2020-06-08 ENCOUNTER — Ambulatory Visit
Admission: RE | Admit: 2020-06-08 | Discharge: 2020-06-08 | Disposition: A | Discharge status: Home / Self Care | Payer: 59 | Source: Ambulatory Visit | Attending: Radiation Oncology | Admitting: Radiation Oncology

## 2020-06-08 DIAGNOSIS — C541 Malignant neoplasm of endometrium: Secondary | ICD-10-CM | POA: Diagnosis not present

## 2020-06-08 DIAGNOSIS — Z510 Encounter for antineoplastic radiation therapy: Secondary | ICD-10-CM | POA: Diagnosis not present

## 2020-06-08 DIAGNOSIS — Z5112 Encounter for antineoplastic immunotherapy: Secondary | ICD-10-CM | POA: Diagnosis not present

## 2020-06-08 DIAGNOSIS — Z79899 Other long term (current) drug therapy: Secondary | ICD-10-CM | POA: Diagnosis not present

## 2020-06-08 DIAGNOSIS — D61818 Other pancytopenia: Secondary | ICD-10-CM | POA: Diagnosis not present

## 2020-06-08 DIAGNOSIS — E039 Hypothyroidism, unspecified: Secondary | ICD-10-CM | POA: Diagnosis not present

## 2020-06-08 DIAGNOSIS — C770 Secondary and unspecified malignant neoplasm of lymph nodes of head, face and neck: Secondary | ICD-10-CM | POA: Diagnosis not present

## 2020-06-09 ENCOUNTER — Ambulatory Visit
Admission: RE | Admit: 2020-06-09 | Discharge: 2020-06-09 | Disposition: A | Discharge status: Home / Self Care | Payer: 59 | Source: Ambulatory Visit | Attending: Radiation Oncology | Admitting: Radiation Oncology

## 2020-06-09 ENCOUNTER — Other Ambulatory Visit: Payer: Self-pay

## 2020-06-09 DIAGNOSIS — C541 Malignant neoplasm of endometrium: Secondary | ICD-10-CM | POA: Diagnosis not present

## 2020-06-09 DIAGNOSIS — Z510 Encounter for antineoplastic radiation therapy: Secondary | ICD-10-CM | POA: Diagnosis not present

## 2020-06-09 DIAGNOSIS — D61818 Other pancytopenia: Secondary | ICD-10-CM | POA: Diagnosis not present

## 2020-06-09 DIAGNOSIS — C770 Secondary and unspecified malignant neoplasm of lymph nodes of head, face and neck: Secondary | ICD-10-CM | POA: Diagnosis not present

## 2020-06-09 DIAGNOSIS — Z79899 Other long term (current) drug therapy: Secondary | ICD-10-CM | POA: Diagnosis not present

## 2020-06-09 DIAGNOSIS — E039 Hypothyroidism, unspecified: Secondary | ICD-10-CM | POA: Diagnosis not present

## 2020-06-09 DIAGNOSIS — Z5112 Encounter for antineoplastic immunotherapy: Secondary | ICD-10-CM | POA: Diagnosis not present

## 2020-06-10 ENCOUNTER — Ambulatory Visit
Admission: RE | Admit: 2020-06-10 | Discharge: 2020-06-10 | Disposition: A | Discharge status: Home / Self Care | Payer: 59 | Source: Ambulatory Visit | Attending: Radiation Oncology | Admitting: Radiation Oncology

## 2020-06-10 ENCOUNTER — Other Ambulatory Visit: Payer: Self-pay

## 2020-06-10 DIAGNOSIS — Z79899 Other long term (current) drug therapy: Secondary | ICD-10-CM | POA: Diagnosis not present

## 2020-06-10 DIAGNOSIS — C541 Malignant neoplasm of endometrium: Secondary | ICD-10-CM | POA: Diagnosis not present

## 2020-06-10 DIAGNOSIS — D61818 Other pancytopenia: Secondary | ICD-10-CM | POA: Diagnosis not present

## 2020-06-10 DIAGNOSIS — Z510 Encounter for antineoplastic radiation therapy: Secondary | ICD-10-CM | POA: Diagnosis not present

## 2020-06-10 DIAGNOSIS — Z5112 Encounter for antineoplastic immunotherapy: Secondary | ICD-10-CM | POA: Diagnosis not present

## 2020-06-10 DIAGNOSIS — C770 Secondary and unspecified malignant neoplasm of lymph nodes of head, face and neck: Secondary | ICD-10-CM | POA: Diagnosis not present

## 2020-06-10 DIAGNOSIS — E039 Hypothyroidism, unspecified: Secondary | ICD-10-CM | POA: Diagnosis not present

## 2020-06-11 ENCOUNTER — Ambulatory Visit
Admission: RE | Admit: 2020-06-11 | Discharge: 2020-06-11 | Disposition: A | Discharge status: Home / Self Care | Payer: 59 | Source: Ambulatory Visit | Attending: Radiation Oncology | Admitting: Radiation Oncology

## 2020-06-11 DIAGNOSIS — E039 Hypothyroidism, unspecified: Secondary | ICD-10-CM | POA: Diagnosis not present

## 2020-06-11 DIAGNOSIS — D61818 Other pancytopenia: Secondary | ICD-10-CM | POA: Diagnosis not present

## 2020-06-11 DIAGNOSIS — C770 Secondary and unspecified malignant neoplasm of lymph nodes of head, face and neck: Secondary | ICD-10-CM | POA: Diagnosis not present

## 2020-06-11 DIAGNOSIS — Z510 Encounter for antineoplastic radiation therapy: Secondary | ICD-10-CM | POA: Diagnosis not present

## 2020-06-11 DIAGNOSIS — Z79899 Other long term (current) drug therapy: Secondary | ICD-10-CM | POA: Diagnosis not present

## 2020-06-11 DIAGNOSIS — Z5112 Encounter for antineoplastic immunotherapy: Secondary | ICD-10-CM | POA: Diagnosis not present

## 2020-06-11 DIAGNOSIS — C541 Malignant neoplasm of endometrium: Secondary | ICD-10-CM | POA: Diagnosis not present

## 2020-06-14 ENCOUNTER — Other Ambulatory Visit: Payer: Self-pay

## 2020-06-14 ENCOUNTER — Ambulatory Visit
Admission: RE | Admit: 2020-06-14 | Discharge: 2020-06-14 | Disposition: A | Discharge status: Home / Self Care | Payer: 59 | Source: Ambulatory Visit | Attending: Radiation Oncology | Admitting: Radiation Oncology

## 2020-06-14 DIAGNOSIS — Z510 Encounter for antineoplastic radiation therapy: Secondary | ICD-10-CM | POA: Diagnosis not present

## 2020-06-14 DIAGNOSIS — C770 Secondary and unspecified malignant neoplasm of lymph nodes of head, face and neck: Secondary | ICD-10-CM | POA: Diagnosis not present

## 2020-06-14 DIAGNOSIS — C541 Malignant neoplasm of endometrium: Secondary | ICD-10-CM | POA: Diagnosis not present

## 2020-06-14 DIAGNOSIS — D61818 Other pancytopenia: Secondary | ICD-10-CM | POA: Diagnosis not present

## 2020-06-14 DIAGNOSIS — Z79899 Other long term (current) drug therapy: Secondary | ICD-10-CM | POA: Diagnosis not present

## 2020-06-14 DIAGNOSIS — E039 Hypothyroidism, unspecified: Secondary | ICD-10-CM | POA: Diagnosis not present

## 2020-06-14 DIAGNOSIS — Z5112 Encounter for antineoplastic immunotherapy: Secondary | ICD-10-CM | POA: Diagnosis not present

## 2020-06-15 ENCOUNTER — Ambulatory Visit
Admission: RE | Admit: 2020-06-15 | Discharge: 2020-06-15 | Disposition: A | Discharge status: Home / Self Care | Payer: 59 | Source: Ambulatory Visit | Attending: Radiation Oncology | Admitting: Radiation Oncology

## 2020-06-15 DIAGNOSIS — D61818 Other pancytopenia: Secondary | ICD-10-CM | POA: Diagnosis not present

## 2020-06-15 DIAGNOSIS — C541 Malignant neoplasm of endometrium: Secondary | ICD-10-CM | POA: Diagnosis not present

## 2020-06-15 DIAGNOSIS — Z510 Encounter for antineoplastic radiation therapy: Secondary | ICD-10-CM | POA: Diagnosis not present

## 2020-06-15 DIAGNOSIS — Z79899 Other long term (current) drug therapy: Secondary | ICD-10-CM | POA: Diagnosis not present

## 2020-06-15 DIAGNOSIS — C770 Secondary and unspecified malignant neoplasm of lymph nodes of head, face and neck: Secondary | ICD-10-CM | POA: Diagnosis not present

## 2020-06-15 DIAGNOSIS — E039 Hypothyroidism, unspecified: Secondary | ICD-10-CM | POA: Diagnosis not present

## 2020-06-15 DIAGNOSIS — Z5112 Encounter for antineoplastic immunotherapy: Secondary | ICD-10-CM | POA: Diagnosis not present

## 2020-06-15 MED FILL — SIMVASTATIN 20 MG TABLET: 20 | 90 days supply | Qty: 90 | Fill #3

## 2020-06-15 MED FILL — LENVIMA 10 MG DAILY DOSE: 10 | 30 days supply | Qty: 30 | Fill #1

## 2020-06-16 ENCOUNTER — Ambulatory Visit
Admission: RE | Admit: 2020-06-16 | Discharge: 2020-06-16 | Disposition: A | Discharge status: Home / Self Care | Payer: 59 | Source: Ambulatory Visit | Attending: Radiation Oncology | Admitting: Radiation Oncology

## 2020-06-16 ENCOUNTER — Other Ambulatory Visit: Payer: Self-pay

## 2020-06-16 DIAGNOSIS — Z510 Encounter for antineoplastic radiation therapy: Secondary | ICD-10-CM | POA: Diagnosis not present

## 2020-06-16 DIAGNOSIS — Z5112 Encounter for antineoplastic immunotherapy: Secondary | ICD-10-CM | POA: Diagnosis not present

## 2020-06-16 DIAGNOSIS — E039 Hypothyroidism, unspecified: Secondary | ICD-10-CM | POA: Diagnosis not present

## 2020-06-16 DIAGNOSIS — C541 Malignant neoplasm of endometrium: Secondary | ICD-10-CM | POA: Diagnosis not present

## 2020-06-16 DIAGNOSIS — Z79899 Other long term (current) drug therapy: Secondary | ICD-10-CM | POA: Diagnosis not present

## 2020-06-16 DIAGNOSIS — D61818 Other pancytopenia: Secondary | ICD-10-CM | POA: Diagnosis not present

## 2020-06-16 DIAGNOSIS — C770 Secondary and unspecified malignant neoplasm of lymph nodes of head, face and neck: Secondary | ICD-10-CM | POA: Diagnosis not present

## 2020-06-17 ENCOUNTER — Inpatient Hospital Stay (HOSPITAL_BASED_OUTPATIENT_CLINIC_OR_DEPARTMENT_OTHER): Payer: 59 | Admitting: Hematology and Oncology

## 2020-06-17 ENCOUNTER — Inpatient Hospital Stay: Payer: 59

## 2020-06-17 ENCOUNTER — Inpatient Hospital Stay: Payer: 59 | Attending: Hematology and Oncology

## 2020-06-17 ENCOUNTER — Other Ambulatory Visit: Payer: Self-pay

## 2020-06-17 ENCOUNTER — Encounter: Payer: Self-pay | Admitting: Hematology and Oncology

## 2020-06-17 ENCOUNTER — Ambulatory Visit
Admission: RE | Admit: 2020-06-17 | Discharge: 2020-06-17 | Disposition: A | Discharge status: Home / Self Care | Payer: 59 | Source: Ambulatory Visit | Attending: Radiation Oncology | Admitting: Radiation Oncology

## 2020-06-17 DIAGNOSIS — C541 Malignant neoplasm of endometrium: Secondary | ICD-10-CM | POA: Diagnosis not present

## 2020-06-17 DIAGNOSIS — Z7189 Other specified counseling: Secondary | ICD-10-CM

## 2020-06-17 DIAGNOSIS — I10 Essential (primary) hypertension: Secondary | ICD-10-CM

## 2020-06-17 DIAGNOSIS — Z79899 Other long term (current) drug therapy: Secondary | ICD-10-CM | POA: Diagnosis not present

## 2020-06-17 DIAGNOSIS — C770 Secondary and unspecified malignant neoplasm of lymph nodes of head, face and neck: Secondary | ICD-10-CM | POA: Diagnosis not present

## 2020-06-17 DIAGNOSIS — D61818 Other pancytopenia: Secondary | ICD-10-CM | POA: Diagnosis not present

## 2020-06-17 DIAGNOSIS — E039 Hypothyroidism, unspecified: Secondary | ICD-10-CM

## 2020-06-17 DIAGNOSIS — Z510 Encounter for antineoplastic radiation therapy: Secondary | ICD-10-CM | POA: Insufficient documentation

## 2020-06-17 DIAGNOSIS — Z5112 Encounter for antineoplastic immunotherapy: Secondary | ICD-10-CM | POA: Insufficient documentation

## 2020-06-17 LAB — CBC WITH DIFFERENTIAL/PLATELET
Abs Immature Granulocytes: 0 10*3/uL (ref 0.00–0.07)
Basophils Absolute: 0 10*3/uL (ref 0.0–0.1)
Basophils Relative: 1 %
Eosinophils Absolute: 0.2 10*3/uL (ref 0.0–0.5)
Eosinophils Relative: 5 %
HCT: 31.5 % — ABNORMAL LOW (ref 36.0–46.0)
Hemoglobin: 10.3 g/dL — ABNORMAL LOW (ref 12.0–15.0)
Immature Granulocytes: 0 %
Lymphocytes Relative: 23 %
Lymphs Abs: 0.8 10*3/uL (ref 0.7–4.0)
MCH: 31 pg (ref 26.0–34.0)
MCHC: 32.7 g/dL (ref 30.0–36.0)
MCV: 94.9 fL (ref 80.0–100.0)
Monocytes Absolute: 0.3 10*3/uL (ref 0.1–1.0)
Monocytes Relative: 9 %
Neutro Abs: 2.1 10*3/uL (ref 1.7–7.7)
Neutrophils Relative %: 62 %
Platelets: 159 10*3/uL (ref 150–400)
RBC: 3.32 MIL/uL — ABNORMAL LOW (ref 3.87–5.11)
RDW: 13.5 % (ref 11.5–15.5)
WBC: 3.3 10*3/uL — ABNORMAL LOW (ref 4.0–10.5)
nRBC: 0 % (ref 0.0–0.2)

## 2020-06-17 LAB — COMPREHENSIVE METABOLIC PANEL
ALT: 20 U/L (ref 0–44)
AST: 24 U/L (ref 15–41)
Albumin: 3.6 g/dL (ref 3.5–5.0)
Alkaline Phosphatase: 51 U/L (ref 38–126)
Anion gap: 8 (ref 5–15)
BUN: 21 mg/dL (ref 8–23)
CO2: 29 mmol/L (ref 22–32)
Calcium: 9.5 mg/dL (ref 8.9–10.3)
Chloride: 104 mmol/L (ref 98–111)
Creatinine, Ser: 0.85 mg/dL (ref 0.44–1.00)
GFR, Estimated: >60 mL/min (ref >60)
Glucose, Bld: 113 mg/dL — ABNORMAL HIGH (ref 70–99)
Potassium: 3.8 mmol/L (ref 3.5–5.1)
Sodium: 141 mmol/L (ref 135–145)
Total Bilirubin: 0.5 mg/dL (ref 0.3–1.2)
Total Protein: 7 g/dL (ref 6.5–8.1)

## 2020-06-17 LAB — TSH: TSH: 1.608 u[IU]/mL (ref 0.308–3.960)

## 2020-06-17 LAB — TOTAL PROTEIN, URINE DIPSTICK: Protein, ur: NEGATIVE mg/dL

## 2020-06-17 MED ORDER — SODIUM CHLORIDE 0.9% FLUSH
10.0000 mL | INTRAVENOUS | Status: DC | PRN
  Administered 2020-06-17: 10 mL
  Filled 2020-06-17: qty 10

## 2020-06-17 MED ORDER — SODIUM CHLORIDE 0.9 % IV SOLN
Freq: Once | INTRAVENOUS | Status: AC
  Filled 2020-06-17: qty 250

## 2020-06-17 MED ORDER — SODIUM CHLORIDE 0.9 % IV SOLN
200.0000 mg | Freq: Once | INTRAVENOUS | Status: AC
  Administered 2020-06-17: 200 mg via INTRAVENOUS
  Filled 2020-06-17: qty 8

## 2020-06-17 MED ORDER — HEPARIN SOD (PORK) LOCK FLUSH 100 UNIT/ML IV SOLN
500.0000 [IU] | Freq: Once | INTRAVENOUS | Status: AC | PRN
  Administered 2020-06-17: 500 [IU]
  Filled 2020-06-17: qty 5

## 2020-06-17 NOTE — Assessment & Plan Note (Signed)
This is due to side effects of treatment She is not symptomatic Observe only 

## 2020-06-17 NOTE — Assessment & Plan Note (Addendum)
She is tolerating treatment well She has some mild fatigue slightly worse than before with additional radiation treatment Continue supportive care for now and we will continue pembrolizumab as scheduled Plan to wait at least 6 to 8 weeks after radiation treatment is completed before repeating another imaging study

## 2020-06-17 NOTE — Progress Notes (Signed)
[place] OFFICE PROGRESS NOTE  Patient Care Team: [name], MD as PCP - General (Family Medicine) Reynold Bowen, MD as Consulting Physician (Endocrinology)  ASSESSMENT & PLAN:  Endometrial cancer [facility]) She is tolerating treatment well She has some mild fatigue slightly worse than before with additional radiation treatment Continue supportive care for now and we will continue pembrolizumab as scheduled Plan to wait at least 6 to 8 weeks after radiation treatment is completed before repeating another imaging study  Pancytopenia, acquired [facility]) This is due to side effects of treatment She is not symptomatic Observe only  Acquired hypothyroidism She has intermittent elevated TSH I will adjust her thyroid medicine accordingly   No orders of the defined types were placed in this encounter.   All questions were answered. The patient knows to call the clinic with any problems, questions or concerns. The total time spent in the appointment was 20 minutes encounter with patients including review of chart and various tests results, discussions about plan of care and coordination of care plan   [name], MD 06/17/2020 12:27 PM  INTERVAL HISTORY: Please see below for problem oriented charting. She returns for further follow-up She is doing well apart from occasional fatigue Denies sore throat or pain from radiation She has occasional nausea Appetite is fair No recent infection, fever or chills  SUMMARY OF ONCOLOGIC HISTORY: Oncology History Overview Note  [employer] testing done 11-2015 on surgical path from 2014: MS stable TMB low 4 muts/mb ATM [id number] ERBB3 T389K E2H2 rearrangement exon 9 PPP2R1A P179R TP53 I195N    ER- APPROXIMATELY 25-35% STAINING IN NEOPLASTIC CELLS (INTERMEDIATE)  PR- APPROXIMATELY 25-35% STAINING IN NEOPLASTIC CELLS (STRONG)  Repeat biopsy 04/02/17: ER negative, Her 2 negative  Progressed on Doxil    Endometrial cancer ([place])  06/20/2012 Pathology Results   Biopsy positive for papillary serous carcinoma   06/20/2012 Genetic Testing   Foundation One testing done 11-2015 on surgical path from 2014: MS stable TMB low 4 muts/mb ATM [id number] ERBB3 T389K E2H2 rearrangement exon 9 PPP2R1A P179R TP53 I195N    06/20/2012 Initial Diagnosis   Patient presented to PCP with intermittent vaginal bleeding since ~ Oct 2013, endometrial biopsy 06-20-12 with complex endometrial hyperplasia with atypia   06/26/2012 Imaging   Thickened endometrial lining in a postmenopausal patient experiencing vaginal bleeding. In the setting of post-menopausal bleeding, endometrial sampling is indicated to exclude carcinoma. No focal myometrial abnormalities are seen.  Normal left ovary and non-visualized right ovary   07/30/2012 Surgery   Dr. Polly Cobia performed robotic hysterectomy with bilateral salpingo-oophorectomy, bilateral pelvic lymph node dissection and periaortic lymph node dissection. Intraoperatively on frozen section, the patient was noted to have a large endometrial polyp with changes within the polyp consistent for high-grade malignancy, possibly papillary serous carcinoma. There is no obvious extrauterine disease noted.     07/30/2012 Pathology Results   [phone] SUPPLEMENTAL REPORT  THE ENDOMETRIAL CARCINOMA WAS ANALYZED FOR DNA MISMATCH REPAIR PROTEINS.IMMUNOHISTOCHEMICALLY, THE NEOPLASM RETAINED NUCLEAR EXPRESSION OF 4 GENE PRODUCTS, MLH1, MSH2, MSH6, AND PMS2, INVOLVED IN DNA MISMATCH REPAIR.POSITIVE AND NEGATIVE CONTROLS WORKED APPROPRIATELY.  PER REQUEST, AN ER AND PR ARE PERFORMED ON BLOCK 1I.  ER- APPROXIMATELY 25-35% STAINING IN NEOPLASTIC CELLS (INTERMEDIATE)  PR- APPROXIMATELY 25-35% STAINING IN NEOPLASTIC CELLS (STRONG)  ER AND PR PREDOMINANTLY SHOW STAINING IN THE SEROUS COMPONENT.  DIAGNOSIS  1. UTERUS, CERVIX WITH BILATERAL FALLOPIAN TUBES AND OVARIES,   HYSTERECTOMY AND BILATERAL SALPINGO-OOPHORECTOMY:  HIGH GRADE/POORLY DIFFERENTIATED ENDOMETRIAL ADENOCARCINOMA WITH SOLID AND  SEROUS PAPILLARY COMPONENTS.  HISTOPATHOLOGIC TYPE:A VARIETY OF PATTERNS WERE PRESENT, ALL OF WHICH SHOULD BE CONSIDERED TO BE HIGH GRADE.SEROUS PAPILLARY CARCINOMA WAS SEEN OCCURRING ADJACENT TO SOLID ENDOMETRIAL CARCINOMA WITH MARKED ANAPLASIA AND GIANT CELLS. SIZE:TUMOR MEASURED AT LEAST 4.8 CM IN GREATEST HORIZONTAL DIMENSION.  GRADE: POORLY DIFFERENTIATED OR HIGH GRADE. DEPTH OF INVASION: NO DEFINITE MYOMETRIAL INVOLVEMENT WAS SEEN.THE TUMOR APPEARED CONFINED TO THE POLYP AS WELL AS SURFACE ENDOMETRIUM. WHERE MYOMETRIAL THICKNESS NOT APPLICABLE. SEROSAL INVOLVEMENT: NOT DEMONSTRATED.  ENDOCERVICAL INVOLVEMENT:NOT DEMONSTRATED. RESECTION MARGINS: FREE OF INVOLVEMENT. EXTRAUTERINE EXTENSION:NOT DEMONSTRATED. ANGIOLYMPHATIC INVASION: NOT DEFINITELY SEEN. TOTAL NODES EXAMINED:22.  PELVIC NODES EXAMINED:20.  PELVIC NODES INVOLVED:0.  PARA-AORTIC NODES 2. EXAMINED:  PARA-AORTIC NODES 0. INVOLVED TNM STAGE: T1A N0 MX. AJCC STAGE GROUPING: IA. FIGO STAGE:IA.   08/26/2012 Imaging   No CT evidence for intra-abdominal or pelvic metastatic disease. Trace free pelvic fluid, presumably postoperative although the date of surgery is not documented in the electronic medical record.   08/26/2012 - 12/13/2012 Chemotherapy   She received 6 cycles of carbo/taxol    10/29/2012 - 11/27/2012 Radiation Therapy   May 27, June 5,  June 11, June 19, November 27, 2012: Proximal vagina 30 Gy in 5 fractions     01/17/2013 Imaging   1.  No evidence of recurrent or metastatic disease. 2.  No acute abnormality involving the abdomen or pelvis. 3.  Mild diffuse hepatic steatosis. 4.  Very small supraumbilical midline anterior abdominal wall hernia containing fat, unchanged   01/22/2014 Imaging    No evidence for metastatic or recurrent disease. 2. No bowel obstruction.  Normal appendix. 3. Small fat containing hernia, stable in appearance. 4. Status post hysterectomy and bilateral oophorectomy   02/19/2014 Imaging   No pulmonary lesions are identified. The abnormality on the chest x-ray is due to asymmetric left-sided sternoclavicular joint degenerative disease   10/12/2014 Imaging   New mild retroperitoneal lymphadenopathy in the left paraaortic region and proximal left common iliac chain, consistent with metastatic disease. No other sites of metastatic disease identified within the abdomen or pelvis.     11/27/2014 - 02/11/2015 Chemotherapy   She received 4 cycles of carbo/taxol    03/01/2015 PET scan   Single hypermetabolic small retroperitoneal lymph node along the aorta. 2. No evidence of metastatic disease otherwise in the abdomen or pelvis. No evidence local recurrence. 3. Intensely hypermetabolic enlarged nodule adjacent to the RIGHT lobe of thyroid gland. This presumably represents the biopsied lesion in clinician report which was found to be benign thyroid tissue.   04/05/2015 - 05/13/2015 Radiation Therapy   She received 50.4 gray in 28 fractions with simultaneous integrated boost to 56 gray   06/17/2015 Imaging   No acute process or evidence of metastatic disease in the abdomen or pelvis. Resolution of previously described retroperitoneal adenopathy. 2.  Possible constipation. 3. Atherosclerosis.   06/17/2015 Tumor Marker   Patient's tumor was tested for the following markers: CA125 Results of the tumor marker test revealed 33   08/12/2015 Tumor Marker   Patient's tumor was tested for the following markers: CA125 Results of the tumor marker test revealed 52   08/31/2015 PET scan   Development of right paratracheal hypermetabolic adenopathy, consistent with nodal metastasis. 2. The previously described isolated abdominal retroperitoneal hypermetabolic node has resolved. 3.  Persistent hypermetabolic right thyroid nodule, per report previously biopsied. Correlate with those results. 4.  Possible constipation.   09/09/2015 -  Anti-estrogen oral therapy   She has been receiving alternative treatment between megace  and tamoxifen   09/09/2015 Tumor Marker   Patient's tumor was tested for the following markers: CA125 Results of the tumor marker test revealed 37.8   09/20/2015 Procedure   Technically successful ultrasound-guided thyroid aspiration biopsy , dominant right nodule   09/20/2015 Pathology Results   THYROID, RIGHT, FINE NEEDLE ASPIRATION (SPECIMEN 1 OF 1 COLLECTED 09/20/15): FINDINGS CONSISTENT WITH BENIGN THYROID NODULE (BETHESDA CATEGORY II).   10/28/2015 Tumor Marker   Patient's tumor was tested for the following markers: CA125 Results of the tumor marker test revealed 53.2   11/04/2015 Imaging   Stable benign right thyroid nodule   12/16/2015 Tumor Marker   Patient's tumor was tested for the following markers: CA125 Results of the tumor marker test revealed 38.1   03/22/2016 PET scan   Interval progression of hypermetabolic right paratracheal lymph node consistent with metastatic involvement. 2. Stable hypermetabolic right thyroid nodule. Reportedly this has been biopsied in the past.   03/23/2016 Tumor Marker   Patient's tumor was tested for the following markers: CA125 Results of the tumor marker test revealed 62.4   04/13/2016 - 06/01/2016 Radiation Therapy   She received 56 Gy to the chest in 28 fractions    05/09/2016 Imaging   No evidence of left lower extremity deep vein thrombosis. No evidence of a superficial thrombosis of the greater and lesser saphenous veins. Positive for thrombus noted in several varicosities of the calf. No evidence of Baker's cyst on the left.   05/17/2016 Tumor Marker   Patient's tumor was tested for the following markers: CA125 Results of the tumor marker test revealed 9   06/29/2016 Tumor Marker   Patient's  tumor was tested for the following markers: CA125 Results of the tumor marker test revealed 5.9   08/04/2016 Tumor Marker   Patient's tumor was tested for the following markers: CA125 Results of the tumor marker test revealed 6.0   09/12/2016 PET scan   Complete metabolic response to therapy, with resolution of hypermetabolic mediastinal lymphadenopathy since prior exam. No residual or new metastatic disease identified. Stable hypermetabolic right thyroid lobe nodule, which was previously biopsied on 09/20/2015.   03/13/2017 PET scan   1. Solitary focus of recurrent right paratracheal hypermetabolic activity, with a 1.0 cm right lower paratracheal node having a maximum SUV of 9.0. Appearance compatible with recurrent malignancy. 2. Continued hypermetabolic right thyroid nodule, previously biopsied, presumed benign -correlate with prior biopsy results. 3. Other imaging findings of potential clinical significance: Aortic Atherosclerosis (ICD10-I70.0). Mild cardiomegaly. Prominent stool throughout the colon favors constipation.   04/02/2017 Pathology Results   FINE NEEDLE ASPIRATION, ENDOSCOPIC, (EBUS) 4 R NODE (SPECIMEN 1 OF 2 COLLECTED 04/02/17): MALIGNANT CELLS PRESENT, CONSISTENT WITH CARCINOMA. SEE COMMENT. COMMENT: THE MALIGNANT CELLS ARE POSITIVE FOR P53 AND NEGATIVE FOR ESTROGEN RECEPTOR AND TTF-1. THIS PROFILE IS NON-SPECIFIC, BUT THE P53 POSITIVE STAINING IS SUGGESTIVE OF GYNECOLOGIC PRIMARY.    04/11/2017 Procedure   Successful 8 French right internal jugular vein power port placement with its tip at the SVC/RA junction   04/12/2017 Imaging   Normal LV size with mild LV hypertrophy. EF 55-60%. Normal RV size and systolic function. Aortic valve sclerosis without significant stenosis.   04/18/2017 - 06/14/2017 Chemotherapy   The patient had chemotherapy with Doxil    07/10/2017 Imaging   - Left ventricle: The cavity size was normal. There was mild concentric hypertrophy. Systolic  function was normal. The estimated ejection fraction was in the range of 55% to 60%. Wall motion was normal; there were  no regional wall motion abnormalities. Doppler parameters are consistent with abnormal left ventricular relaxation (grade 1 diastolic dysfunction). - Aortic valve: Noncoronary cusp mobility was mildly restricted. - Mitral valve: There was mild regurgitation. - Left atrium: The atrium was mildly dilated. - Atrial septum: No defect or patent foramen ovale was identified.  Impressions:  - Compared to November 2018, global LV longitudinal strain remains normal (has increased)   07/11/2017 PET scan   Increased size and hypermetabolic activity of 3.3 cm right thyroid lobe nodule. Thyroid carcinoma cannot be excluded. Recommend repeat ultrasound guided fine needle aspiration to exclude thyroid carcinoma.  New adjacent hypermetabolic 10 mm right supraclavicular lymph node, suspicious for lymph node metastasis.  Slight increase in size and hypermetabolic activity of solitary right paratracheal lymph node.  No evidence of abdominal or pelvic metastatic disease.   07/18/2017 Procedure   1. Technically successful ultrasound guided fine needle aspiration of indeterminate hypermetabolic right-sided thyroid nodule/mass. 2. Technically successful ultrasound-guided core needle biopsy of hypermetabolic right lower cervical lymph node.   07/18/2017 Pathology Results   THYROID, FINE NEEDLE ASPIRATION RIGHT (SPECIMEN 1 OF 1, COLLECTED ON 07/18/2017): ATYPIA OF UNDETERMINED SIGNIFICANCE OR FOLLICULAR LESION OF UNDETERMINED SIGNIFICANCE (BETHESDA CATEGORY III). SEE COMMENT. COMMENT: THE SPECIMEN CONSISTS OF SMALL AND MEDIUM SIZED GROUPS OF FOLLICULAR EPITHELIAL CELLS WITH MILD CYTOLOGIC ATYPIA INCLUDING NUCLEAR ENLARGEMENT AND HURTHLE CELL CHANGE. SOME GROUPS ARE ARRANGED AS MICROFOLLICLES. THERE IS MINIMAL BACKGROUND COLLOID. BASED ON THESE FEATURES, A FOLLICULAR LESION/NEOPLASM CAN NOT BE ENTIRELY  RULED OUT. A SPECIMEN WILL BE SENT FOR AFIRMA TESTING.   07/19/2017 Pathology Results   Lymph node, needle/core biopsy - METASTATIC PAPILLARY SEROUS CARCINOMA. - SEE COMMENT. Microscopic Comment Dr. Vicente Males has reviewed the case and concurs with this interpretation   10/15/2017 PET scan   No new or progressive disease. No evidence of abdominal or pelvic metastatic disease.  Stable hypermetabolic right thyroid lobe nodule and adjacent right supraclavicular lymph node.  Decreased size and hypermetabolic activity of solitary right paratracheal lymph node.   01/23/2018 PET scan   1. Overall, no significant change from PET-CT of 3 months ago. There is persistent hypermetabolic activity at the right thoracic inlet and within a right paratracheal mediastinal node. The nodes have not significantly changed in size, although the metabolic activity has minimally increased over this interval. It is uncertain how much of the thoracic inlet metabolic activity is attributed to the thyroid nodule versus adjacent cervical lymph nodes, both previously biopsied. 2. No new hypermetabolic activity within the neck, chest, abdomen or pelvis. No evidence of local recurrence in the pelvis. 3. Stable probable radiation changes in the right lung.   04/16/2018 PET scan   1. Interval increase in metabolic activity of the RIGHT supraclavicular node and RIGHT lower paratracheal mediastinal node with minimal change in size. Findings consistent with persistent and mildly progressed metastatic adenopathy. 2. New hypermetabolic small lymph node in the RIGHT lower paratracheal nodal station adjacent to the previously followed node. 3. No evidence of local recurrence in the pelvis or new disease elsewhere.   07/30/2018 PET scan   1. Mild interval progression of right supraclavicular, mediastinal, and right hilar hypermetabolic metastatic disease. There is a new hypermetabolic lymph node in the subcarinal station on today's  study. 2. No hypermetabolic disease in the abdomen or pelvis to suggest recurrent/metastases. 3.  Aortic Atherosclerois (ICD10-170.0)    08/16/2018 -  Chemotherapy   The patient had Lenvima since 3/6 and pembrolizumab since 3/13 for chemotherapy treatment. Michel Santee was  held temporarily due to infection and severe hypertension   10/31/2018 PET scan   Partial response to therapy, with decreased hypermetabolic lymphadenopathy in mediastinum and right hilar region.  No significant change in hypermetabolic lymphadenopathy in right inferior neck.  No new or progressive metastatic disease identified. No evidence of recurrent or metastatic carcinoma in the pelvis or abdomen   02/04/2019 PET scan   1. Decrease in metabolic activity of RIGHT supraclavicular node and RIGHT paratracheal lymph nodes. 2. Persistent activity in the RIGHT thyroid nodule unchanged. 3. Diffuse activity through the esophagus is favored esophagitis. No change. 4. Scattered foci of intense metabolic activity associated the bowel without focal lesion on CT favored benign. 5. No evidence of peritoneal metastasis. 6. No evidence of metastatic adenopathy in the abdomen pelvis. 7. No evidence of local pelvic sidewall recurrence.   07/30/2019 PET scan   1. Mildly increased uptake within small nodes along the right sternocleidomastoid without change in size and associated with increased deltoid activity may reflect changes related to right arm injection or recent COVID vaccination, consider clinical correlation and attention on follow-up. 2. Persistent activity in the right thyroid nodule, unchanged from previous exams. 3. New activity in a juxta esophageal lymph node in the chest is suspicious though the node is unchanged with respect to size. Endoscopic correlation could potentially be helpful or close attention on follow-up. 4. Subtle increased FDG uptake along the left mainstem bronchus and adjacent to the aorta may represent a  small lymph node. No discrete correlate is demonstrated on today's study.   10/22/2019 PET scan   1. Persistent FDG avid subcentimeter right supraclavicular and juxta esophageal lymph nodes. The degree of FDG uptake is similar to the previous exam. No new or progressive findings identified. 2. No findings of solid organ metastasis, skeletal metastasis or metastatic disease to the abdomen or pelvis. 3. FDG avid right lobe of thyroid gland nodule is again noted This nodule is not incidental and been worked up previously with 2 biopsies. Please refer to results from the most recent biopsy dated 07/18/2017. (Ref: J Am Coll Radiol. 2015 Feb;12(2): 143-50). 4.  Aortic Atherosclerosis (ICD10-I70.0).   05/05/2020 PET scan   1. Progressive disease, as evidenced by increased hypermetabolism within right supraclavicular and mediastinal nodes. 2. No infradiaphragmatic hypermetabolic metastasis. 3.  Aortic Atherosclerosis (ICD10-I70.0). 4. 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)  07/11/2017 Initial Diagnosis   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[place])   08/16/2018 -  Chemotherapy   The patient had pembrolizumab (KEYTRUDA) 200 mg in sodium chloride 0.9 % 50 mL chemo infusion, 200 mg, Intravenous, Once, 27 of 28 cycles Administration: 200 mg (08/16/2018), 200 mg (09/06/2018), 200 mg (09/30/2018), 200 mg (10/21/2018), 200 mg (11/11/2018), 200 mg (12/02/2018), 200 mg (12/23/2018), 200 mg (01/13/2019), 200 mg (02/05/2019), 200 mg (02/27/2019), 200 mg (03/20/2019), 200 mg (04/10/2019), 200 mg (05/08/2019), 200 mg (05/29/2019), 200 mg (06/19/2019), 200 mg (07/10/2019), 200 mg (07/31/2019), 200 mg (08/21/2019), 200 mg (09/11/2019), 200 mg (10/02/2019), 200 mg (10/23/2019), 200 mg (11/13/2019), 200 mg (12/04/2019), 200 mg (12/25/2019), 200 mg (01/15/2020), 200 mg (02/05/2020), 200 mg (02/26/2020)  for chemotherapy treatment.      REVIEW  OF SYSTEMS:   Constitutional: Denies fevers, chills or abnormal weight loss Eyes: Denies blurriness of vision Ears, nose, mouth, throat, and face: Denies mucositis or sore throat Respiratory: Denies cough, dyspnea or wheezes Cardiovascular: Denies palpitation, chest discomfort or lower  extremity swelling Gastrointestinal:  Denies nausea, heartburn or change in bowel habits Skin: Denies abnormal skin rashes Lymphatics: Denies new lymphadenopathy or easy bruising Neurological:Denies numbness, tingling or new weaknesses Behavioral/Psych: Mood is stable, no new changes  All other systems were reviewed with the patient and are negative.  I have reviewed the past medical history, past surgical history, social history and family history with the patient and they are unchanged from previous note.  ALLERGIES:  is allergic to other.  MEDICATIONS:  Current Outpatient Medications  Medication Sig Dispense Refill  . amLODipine (NORVASC) 10 MG tablet TAKE 1 TABLET BY MOUTH ONCE DAILY. **DECREASE SIMVASTATIN TO 20 MG DAILY WHILE ON AMLODIPINE PER MD** 90 tablet 1  . aspirin 81 MG EC tablet Take by mouth. (Patient not taking: Reported on 05/12/2020)    . busPIRone (BUSPAR) 10 MG tablet Take 10 mg by mouth 2 (two) times daily.    . Calcium Carb-Cholecalciferol 500-600 MG-UNIT TABS Take 1 tablet by mouth daily.     . Cholecalciferol (VITAMIN D3) 2000 units TABS Take 2,000 Units by mouth daily.     . hydrochlorothiazide (MICROZIDE) 12.5 MG capsule Take 12.5 mg by mouth daily.    Marland Kitchen lenvatinib 10 mg daily dose (LENVIMA, 10 MG DAILY DOSE,) capsule Take 1 capsule (10 mg total) by mouth daily. 30 capsule 11  . levothyroxine (SYNTHROID) 75 MCG tablet Take 1 tablet (75 mcg total) by mouth daily before breakfast.    . lidocaine-prilocaine (EMLA) cream APPLY TO AFFECTED AREA ONCE AS DIRECTED 30 g 3  . LORazepam (ATIVAN) 0.5 MG tablet Take 0.5 mg by mouth 2 (two) times daily as needed. (Patient not taking: Reported on  05/12/2020)  0  . losartan (COZAAR) 25 MG tablet Take 1 tablet (25 mg total) by mouth daily. 30 tablet 11  . metoprolol tartrate (LOPRESSOR) 25 MG tablet TAKE 1 TABLET (25 MG TOTAL) BY MOUTH 2 (TWO) TIMES DAILY. 180 tablet 3  . ondansetron (ZOFRAN) 8 MG tablet Take 1 tablet (8 mg total) by mouth every 8 (eight) hours as needed for nausea. (Patient not taking: Reported on 05/12/2020) 30 tablet 3  . pantoprazole (PROTONIX) 40 MG tablet Take 1 tablet (40 mg total) by mouth daily. 90 tablet 3  . simvastatin (ZOCOR) 20 MG tablet SMARTSIG:1 Tablet(s) By Mouth Every Evening    . tacrolimus (PROTOPIC) 0.1 % ointment Apply 1 application topically daily as needed (rash).     . venlafaxine XR (EFFEXOR-XR) 150 MG 24 hr capsule Take 300 mg by mouth every morning.     No current facility-administered medications for this visit.    PHYSICAL EXAMINATION: ECOG PERFORMANCE STATUS: 1 - Symptomatic but completely ambulatory  Vitals:   06/17/20 1214  BP: 120/62  Pulse: 66  Resp: 18  Temp: 97.6 F (36.4 C)  SpO2: 99%   Filed Weights   06/17/20 1214  Weight: 154 lb 9.6 oz (70.1 kg)    GENERAL:alert, no distress and comfortable SKIN: skin color, texture, turgor are normal, no rashes or significant lesions EYES: normal, Conjunctiva are pink and non-injected, sclera clear OROPHARYNX:no exudate, no erythema and lips, buccal mucosa, and tongue normal  NECK: supple, thyroid normal size, non-tender, without nodularity LYMPH:  no palpable lymphadenopathy in the cervical, axillary or inguinal LUNGS: clear to auscultation and percussion with normal breathing effort HEART: regular rate & rhythm and no murmurs and no lower extremity edema ABDOMEN:abdomen soft, non-tender and normal bowel sounds Musculoskeletal:no cyanosis of digits and no clubbing  NEURO: alert &  oriented x 3 with fluent speech, no focal motor/sensory deficits  LABORATORY DATA:  I have reviewed the data as listed    Component Value Date/Time    NA 137 05/27/2020 1211   NA 141 05/17/2017 0957   K 4.0 05/27/2020 1211   K 3.6 05/17/2017 0957   CL 101 05/27/2020 1211   CL 105 11/19/2012 0848   CO2 27 05/27/2020 1211   CO2 25 05/17/2017 0957   GLUCOSE 97 05/27/2020 1211   GLUCOSE 85 05/17/2017 0957   GLUCOSE 122 (H) 11/19/2012 0848   BUN 28 (H) 05/27/2020 1211   BUN 15.2 05/17/2017 0957   CREATININE 1.03 (H) 05/27/2020 1211   CREATININE 0.83 07/10/2019 1240   CREATININE 0.8 05/17/2017 0957   CALCIUM 9.2 05/27/2020 1211   CALCIUM 9.6 05/17/2017 0957   PROT 6.8 05/27/2020 1211   PROT 7.0 05/17/2017 0957   ALBUMIN 3.8 05/27/2020 1211   ALBUMIN 3.9 05/17/2017 0957   AST 24 05/27/2020 1211   AST 23 07/10/2019 1240   AST 25 05/17/2017 0957   ALT 18 05/27/2020 1211   ALT 20 07/10/2019 1240   ALT 18 05/17/2017 0957   ALKPHOS 55 05/27/2020 1211   ALKPHOS 57 05/17/2017 0957   BILITOT 0.6 05/27/2020 1211   BILITOT 0.4 07/10/2019 1240   BILITOT 0.47 05/17/2017 0957   GFRNONAA 60 (L) 05/27/2020 1211   GFRNONAA >60 07/10/2019 1240   GFRAA >60 02/26/2020 0940   GFRAA >60 07/10/2019 1240    No results found for: SPEP, UPEP  Lab Results  Component Value Date   WBC 3.3 (L) 06/17/2020   NEUTROABS 2.1 06/17/2020   HGB 10.3 (L) 06/17/2020   HCT 31.5 (L) 06/17/2020   MCV 94.9 06/17/2020   PLT 159 06/17/2020      Chemistry      Component Value Date/Time   NA 137 05/27/2020 1211   NA 141 05/17/2017 0957   K 4.0 05/27/2020 1211   K 3.6 05/17/2017 0957   CL 101 05/27/2020 1211   CL 105 11/19/2012 0848   CO2 27 05/27/2020 1211   CO2 25 05/17/2017 0957   BUN 28 (H) 05/27/2020 1211   BUN 15.2 05/17/2017 0957   CREATININE 1.03 (H) 05/27/2020 1211   CREATININE 0.83 07/10/2019 1240   CREATININE 0.8 05/17/2017 0957   GLU 158 (H) 01/14/2015 1035      Component Value Date/Time   CALCIUM 9.2 05/27/2020 1211   CALCIUM 9.6 05/17/2017 0957   ALKPHOS 55 05/27/2020 1211   ALKPHOS 57 05/17/2017 0957   AST 24 05/27/2020 1211    AST 23 07/10/2019 1240   AST 25 05/17/2017 0957   ALT 18 05/27/2020 1211   ALT 20 07/10/2019 1240   ALT 18 05/17/2017 0957   BILITOT 0.6 05/27/2020 1211   BILITOT 0.4 07/10/2019 1240   BILITOT 0.47 05/17/2017 0957

## 2020-06-17 NOTE — Assessment & Plan Note (Signed)
She has intermittent elevated TSH I will adjust her thyroid medicine accordingly 

## 2020-06-17 NOTE — Patient Instructions (Signed)
[facility] Discharge Instructions for Patients Receiving Chemotherapy  Today you received the following chemotherapy agents: pembrolizumab.  To help prevent nausea and vomiting after your treatment, we encourage you to take your nausea medication as directed.   If you develop nausea and vomiting that is not controlled by your nausea medication, call the clinic.   BELOW ARE SYMPTOMS THAT SHOULD BE REPORTED IMMEDIATELY:  *FEVER GREATER THAN 100.5 F  *CHILLS WITH OR WITHOUT FEVER  NAUSEA AND VOMITING THAT IS NOT CONTROLLED WITH YOUR NAUSEA MEDICATION  *UNUSUAL SHORTNESS OF BREATH  *UNUSUAL BRUISING OR BLEEDING  TENDERNESS IN MOUTH AND THROAT WITH OR WITHOUT PRESENCE OF ULCERS  *URINARY PROBLEMS  *BOWEL PROBLEMS  UNUSUAL RASH Items with * indicate a potential emergency and should be followed up as soon as possible.  Feel free to call the clinic should you have any questions or concerns. The clinic phone number is [phone].  Please show the CHEMO ALERT CARD at check-in to the Emergency Department and triage nurse.   

## 2020-06-18 ENCOUNTER — Ambulatory Visit
Admission: RE | Admit: 2020-06-18 | Discharge: 2020-06-18 | Disposition: A | Discharge status: Home / Self Care | Payer: 59 | Source: Ambulatory Visit | Attending: Radiation Oncology | Admitting: Radiation Oncology

## 2020-06-18 DIAGNOSIS — Z5112 Encounter for antineoplastic immunotherapy: Secondary | ICD-10-CM | POA: Diagnosis not present

## 2020-06-18 DIAGNOSIS — Z510 Encounter for antineoplastic radiation therapy: Secondary | ICD-10-CM | POA: Diagnosis not present

## 2020-06-18 DIAGNOSIS — Z79899 Other long term (current) drug therapy: Secondary | ICD-10-CM | POA: Diagnosis not present

## 2020-06-18 DIAGNOSIS — E039 Hypothyroidism, unspecified: Secondary | ICD-10-CM | POA: Diagnosis not present

## 2020-06-18 DIAGNOSIS — D61818 Other pancytopenia: Secondary | ICD-10-CM | POA: Diagnosis not present

## 2020-06-18 DIAGNOSIS — C541 Malignant neoplasm of endometrium: Secondary | ICD-10-CM | POA: Diagnosis not present

## 2020-06-18 DIAGNOSIS — C770 Secondary and unspecified malignant neoplasm of lymph nodes of head, face and neck: Secondary | ICD-10-CM | POA: Diagnosis not present

## 2020-06-18 MED FILL — AMLODIPINE BESYLATE 10 MG T: 10 | 90 days supply | Qty: 90 | Fill #1

## 2020-06-21 ENCOUNTER — Ambulatory Visit: Payer: 59

## 2020-06-22 ENCOUNTER — Ambulatory Visit
Admission: RE | Admit: 2020-06-22 | Discharge: 2020-06-22 | Disposition: A | Discharge status: Home / Self Care | Payer: 59 | Source: Ambulatory Visit | Attending: Radiation Oncology | Admitting: Radiation Oncology

## 2020-06-22 DIAGNOSIS — C541 Malignant neoplasm of endometrium: Secondary | ICD-10-CM | POA: Diagnosis not present

## 2020-06-22 DIAGNOSIS — D61818 Other pancytopenia: Secondary | ICD-10-CM | POA: Diagnosis not present

## 2020-06-22 DIAGNOSIS — Z510 Encounter for antineoplastic radiation therapy: Secondary | ICD-10-CM | POA: Diagnosis not present

## 2020-06-22 DIAGNOSIS — C770 Secondary and unspecified malignant neoplasm of lymph nodes of head, face and neck: Secondary | ICD-10-CM | POA: Diagnosis not present

## 2020-06-22 DIAGNOSIS — E039 Hypothyroidism, unspecified: Secondary | ICD-10-CM | POA: Diagnosis not present

## 2020-06-22 DIAGNOSIS — Z5112 Encounter for antineoplastic immunotherapy: Secondary | ICD-10-CM | POA: Diagnosis not present

## 2020-06-22 DIAGNOSIS — Z79899 Other long term (current) drug therapy: Secondary | ICD-10-CM | POA: Diagnosis not present

## 2020-06-23 ENCOUNTER — Ambulatory Visit
Admission: RE | Admit: 2020-06-23 | Discharge: 2020-06-23 | Disposition: A | Discharge status: Home / Self Care | Payer: 59 | Source: Ambulatory Visit | Attending: Radiation Oncology | Admitting: Radiation Oncology

## 2020-06-23 DIAGNOSIS — C770 Secondary and unspecified malignant neoplasm of lymph nodes of head, face and neck: Secondary | ICD-10-CM | POA: Diagnosis not present

## 2020-06-23 DIAGNOSIS — Z510 Encounter for antineoplastic radiation therapy: Secondary | ICD-10-CM | POA: Diagnosis not present

## 2020-06-23 DIAGNOSIS — E039 Hypothyroidism, unspecified: Secondary | ICD-10-CM | POA: Diagnosis not present

## 2020-06-23 DIAGNOSIS — D61818 Other pancytopenia: Secondary | ICD-10-CM | POA: Diagnosis not present

## 2020-06-23 DIAGNOSIS — C541 Malignant neoplasm of endometrium: Secondary | ICD-10-CM | POA: Diagnosis not present

## 2020-06-23 DIAGNOSIS — Z5112 Encounter for antineoplastic immunotherapy: Secondary | ICD-10-CM | POA: Diagnosis not present

## 2020-06-23 DIAGNOSIS — Z79899 Other long term (current) drug therapy: Secondary | ICD-10-CM | POA: Diagnosis not present

## 2020-06-23 MED FILL — LOSARTAN POTASSIUM 25 MG TA: 25 | 30 days supply | Qty: 30 | Fill #3

## 2020-06-23 MED FILL — METOPROLOL TARTRATE 25 MG T: 25 | 90 days supply | Qty: 180 | Fill #2

## 2020-06-24 ENCOUNTER — Ambulatory Visit
Admission: RE | Admit: 2020-06-24 | Discharge: 2020-06-24 | Disposition: A | Discharge status: Home / Self Care | Payer: 59 | Source: Ambulatory Visit | Attending: Radiation Oncology | Admitting: Radiation Oncology

## 2020-06-24 DIAGNOSIS — E039 Hypothyroidism, unspecified: Secondary | ICD-10-CM | POA: Diagnosis not present

## 2020-06-24 DIAGNOSIS — Z79899 Other long term (current) drug therapy: Secondary | ICD-10-CM | POA: Diagnosis not present

## 2020-06-24 DIAGNOSIS — C541 Malignant neoplasm of endometrium: Secondary | ICD-10-CM | POA: Diagnosis not present

## 2020-06-24 DIAGNOSIS — D61818 Other pancytopenia: Secondary | ICD-10-CM | POA: Diagnosis not present

## 2020-06-24 DIAGNOSIS — Z5112 Encounter for antineoplastic immunotherapy: Secondary | ICD-10-CM | POA: Diagnosis not present

## 2020-06-24 DIAGNOSIS — Z510 Encounter for antineoplastic radiation therapy: Secondary | ICD-10-CM | POA: Diagnosis not present

## 2020-06-24 DIAGNOSIS — C770 Secondary and unspecified malignant neoplasm of lymph nodes of head, face and neck: Secondary | ICD-10-CM | POA: Diagnosis not present

## 2020-06-25 ENCOUNTER — Other Ambulatory Visit: Payer: Self-pay

## 2020-06-25 ENCOUNTER — Ambulatory Visit
Admission: RE | Admit: 2020-06-25 | Discharge: 2020-06-25 | Disposition: A | Discharge status: Home / Self Care | Payer: 59 | Source: Ambulatory Visit | Attending: Radiation Oncology | Admitting: Radiation Oncology

## 2020-06-25 DIAGNOSIS — C770 Secondary and unspecified malignant neoplasm of lymph nodes of head, face and neck: Secondary | ICD-10-CM | POA: Diagnosis not present

## 2020-06-25 DIAGNOSIS — C541 Malignant neoplasm of endometrium: Secondary | ICD-10-CM | POA: Diagnosis not present

## 2020-06-25 DIAGNOSIS — E039 Hypothyroidism, unspecified: Secondary | ICD-10-CM | POA: Diagnosis not present

## 2020-06-25 DIAGNOSIS — Z510 Encounter for antineoplastic radiation therapy: Secondary | ICD-10-CM | POA: Diagnosis not present

## 2020-06-25 DIAGNOSIS — Z5112 Encounter for antineoplastic immunotherapy: Secondary | ICD-10-CM | POA: Diagnosis not present

## 2020-06-25 DIAGNOSIS — Z79899 Other long term (current) drug therapy: Secondary | ICD-10-CM | POA: Diagnosis not present

## 2020-06-25 DIAGNOSIS — D61818 Other pancytopenia: Secondary | ICD-10-CM | POA: Diagnosis not present

## 2020-06-28 ENCOUNTER — Ambulatory Visit
Admission: RE | Admit: 2020-06-28 | Discharge: 2020-06-28 | Disposition: A | Discharge status: Home / Self Care | Payer: 59 | Source: Ambulatory Visit | Attending: Radiation Oncology | Admitting: Radiation Oncology

## 2020-06-28 DIAGNOSIS — D61818 Other pancytopenia: Secondary | ICD-10-CM | POA: Diagnosis not present

## 2020-06-28 DIAGNOSIS — C770 Secondary and unspecified malignant neoplasm of lymph nodes of head, face and neck: Secondary | ICD-10-CM | POA: Diagnosis not present

## 2020-06-28 DIAGNOSIS — Z510 Encounter for antineoplastic radiation therapy: Secondary | ICD-10-CM | POA: Diagnosis not present

## 2020-06-28 DIAGNOSIS — Z79899 Other long term (current) drug therapy: Secondary | ICD-10-CM | POA: Diagnosis not present

## 2020-06-28 DIAGNOSIS — C541 Malignant neoplasm of endometrium: Secondary | ICD-10-CM | POA: Diagnosis not present

## 2020-06-28 DIAGNOSIS — E039 Hypothyroidism, unspecified: Secondary | ICD-10-CM | POA: Diagnosis not present

## 2020-06-28 DIAGNOSIS — Z5112 Encounter for antineoplastic immunotherapy: Secondary | ICD-10-CM | POA: Diagnosis not present

## 2020-06-29 ENCOUNTER — Other Ambulatory Visit: Payer: Self-pay

## 2020-06-29 ENCOUNTER — Other Ambulatory Visit: Payer: Self-pay | Admitting: Hematology and Oncology

## 2020-06-29 ENCOUNTER — Ambulatory Visit: Payer: 59

## 2020-06-29 ENCOUNTER — Ambulatory Visit
Admission: RE | Admit: 2020-06-29 | Discharge: 2020-06-29 | Disposition: A | Discharge status: Home / Self Care | Payer: 59 | Source: Ambulatory Visit | Attending: Radiation Oncology | Admitting: Radiation Oncology

## 2020-06-29 DIAGNOSIS — Z510 Encounter for antineoplastic radiation therapy: Secondary | ICD-10-CM | POA: Diagnosis not present

## 2020-06-29 DIAGNOSIS — C541 Malignant neoplasm of endometrium: Secondary | ICD-10-CM | POA: Diagnosis not present

## 2020-06-29 DIAGNOSIS — D61818 Other pancytopenia: Secondary | ICD-10-CM | POA: Diagnosis not present

## 2020-06-29 DIAGNOSIS — E039 Hypothyroidism, unspecified: Secondary | ICD-10-CM | POA: Diagnosis not present

## 2020-06-29 DIAGNOSIS — C770 Secondary and unspecified malignant neoplasm of lymph nodes of head, face and neck: Secondary | ICD-10-CM | POA: Diagnosis not present

## 2020-06-29 DIAGNOSIS — Z79899 Other long term (current) drug therapy: Secondary | ICD-10-CM | POA: Diagnosis not present

## 2020-06-29 DIAGNOSIS — Z5112 Encounter for antineoplastic immunotherapy: Secondary | ICD-10-CM | POA: Diagnosis not present

## 2020-06-29 MED FILL — LEVOTHYROXINE 75 MCG TABLET: 75 | 90 days supply | Qty: 90 | Fill #0

## 2020-06-30 ENCOUNTER — Other Ambulatory Visit (HOSPITAL_COMMUNITY): Payer: Self-pay | Admitting: Hematology and Oncology

## 2020-06-30 ENCOUNTER — Other Ambulatory Visit: Payer: Self-pay

## 2020-06-30 ENCOUNTER — Ambulatory Visit
Admission: RE | Admit: 2020-06-30 | Discharge: 2020-06-30 | Disposition: A | Discharge status: Home / Self Care | Payer: 59 | Source: Ambulatory Visit | Attending: Radiation Oncology | Admitting: Radiation Oncology

## 2020-06-30 ENCOUNTER — Encounter: Payer: Self-pay | Admitting: Radiation Oncology

## 2020-06-30 DIAGNOSIS — Z510 Encounter for antineoplastic radiation therapy: Secondary | ICD-10-CM | POA: Diagnosis not present

## 2020-06-30 DIAGNOSIS — Z79899 Other long term (current) drug therapy: Secondary | ICD-10-CM | POA: Diagnosis not present

## 2020-06-30 DIAGNOSIS — D61818 Other pancytopenia: Secondary | ICD-10-CM | POA: Diagnosis not present

## 2020-06-30 DIAGNOSIS — E039 Hypothyroidism, unspecified: Secondary | ICD-10-CM | POA: Diagnosis not present

## 2020-06-30 DIAGNOSIS — C541 Malignant neoplasm of endometrium: Secondary | ICD-10-CM | POA: Diagnosis not present

## 2020-06-30 DIAGNOSIS — Z5112 Encounter for antineoplastic immunotherapy: Secondary | ICD-10-CM | POA: Diagnosis not present

## 2020-06-30 DIAGNOSIS — C770 Secondary and unspecified malignant neoplasm of lymph nodes of head, face and neck: Secondary | ICD-10-CM | POA: Diagnosis not present

## 2020-07-08 ENCOUNTER — Inpatient Hospital Stay (HOSPITAL_BASED_OUTPATIENT_CLINIC_OR_DEPARTMENT_OTHER): Payer: 59 | Admitting: Hematology and Oncology

## 2020-07-08 ENCOUNTER — Other Ambulatory Visit: Payer: Self-pay

## 2020-07-08 ENCOUNTER — Inpatient Hospital Stay: Payer: 59

## 2020-07-08 ENCOUNTER — Inpatient Hospital Stay: Payer: 59 | Attending: Hematology and Oncology

## 2020-07-08 ENCOUNTER — Encounter: Payer: Self-pay | Admitting: Hematology and Oncology

## 2020-07-08 DIAGNOSIS — D61818 Other pancytopenia: Secondary | ICD-10-CM | POA: Insufficient documentation

## 2020-07-08 DIAGNOSIS — Z923 Personal history of irradiation: Secondary | ICD-10-CM | POA: Insufficient documentation

## 2020-07-08 DIAGNOSIS — I10 Essential (primary) hypertension: Secondary | ICD-10-CM | POA: Insufficient documentation

## 2020-07-08 DIAGNOSIS — C541 Malignant neoplasm of endometrium: Secondary | ICD-10-CM

## 2020-07-08 DIAGNOSIS — E039 Hypothyroidism, unspecified: Secondary | ICD-10-CM

## 2020-07-08 DIAGNOSIS — Z7189 Other specified counseling: Secondary | ICD-10-CM

## 2020-07-08 DIAGNOSIS — C770 Secondary and unspecified malignant neoplasm of lymph nodes of head, face and neck: Secondary | ICD-10-CM | POA: Diagnosis not present

## 2020-07-08 DIAGNOSIS — Z95828 Presence of other vascular implants and grafts: Secondary | ICD-10-CM

## 2020-07-08 DIAGNOSIS — Z5112 Encounter for antineoplastic immunotherapy: Secondary | ICD-10-CM | POA: Diagnosis not present

## 2020-07-08 DIAGNOSIS — Z9221 Personal history of antineoplastic chemotherapy: Secondary | ICD-10-CM | POA: Insufficient documentation

## 2020-07-08 LAB — CBC WITH DIFFERENTIAL/PLATELET
Abs Immature Granulocytes: 0.01 10*3/uL (ref 0.00–0.07)
Basophils Absolute: 0 10*3/uL (ref 0.0–0.1)
Basophils Relative: 1 %
Eosinophils Absolute: 0.2 10*3/uL (ref 0.0–0.5)
Eosinophils Relative: 5 %
HCT: 32.9 % — ABNORMAL LOW (ref 36.0–46.0)
Hemoglobin: 10.8 g/dL — ABNORMAL LOW (ref 12.0–15.0)
Immature Granulocytes: 0 %
Lymphocytes Relative: 17 %
Lymphs Abs: 0.7 10*3/uL (ref 0.7–4.0)
MCH: 31.2 pg (ref 26.0–34.0)
MCHC: 32.8 g/dL (ref 30.0–36.0)
MCV: 95.1 fL (ref 80.0–100.0)
Monocytes Absolute: 0.3 10*3/uL (ref 0.1–1.0)
Monocytes Relative: 8 %
Neutro Abs: 2.6 10*3/uL (ref 1.7–7.7)
Neutrophils Relative %: 69 %
Platelets: 165 10*3/uL (ref 150–400)
RBC: 3.46 MIL/uL — ABNORMAL LOW (ref 3.87–5.11)
RDW: 14 % (ref 11.5–15.5)
WBC: 3.7 10*3/uL — ABNORMAL LOW (ref 4.0–10.5)
nRBC: 0 % (ref 0.0–0.2)

## 2020-07-08 LAB — TSH: TSH: 1.755 u[IU]/mL (ref 0.308–3.960)

## 2020-07-08 LAB — COMPREHENSIVE METABOLIC PANEL
ALT: 18 U/L (ref 0–44)
AST: 23 U/L (ref 15–41)
Albumin: 3.8 g/dL (ref 3.5–5.0)
Alkaline Phosphatase: 51 U/L (ref 38–126)
Anion gap: 6 (ref 5–15)
BUN: 24 mg/dL — ABNORMAL HIGH (ref 8–23)
CO2: 28 mmol/L (ref 22–32)
Calcium: 9.7 mg/dL (ref 8.9–10.3)
Chloride: 103 mmol/L (ref 98–111)
Creatinine, Ser: 0.89 mg/dL (ref 0.44–1.00)
GFR, Estimated: >60 mL/min (ref >60)
Glucose, Bld: 91 mg/dL (ref 70–99)
Potassium: 4.1 mmol/L (ref 3.5–5.1)
Sodium: 137 mmol/L (ref 135–145)
Total Bilirubin: 0.7 mg/dL (ref 0.3–1.2)
Total Protein: 7.2 g/dL (ref 6.5–8.1)

## 2020-07-08 LAB — TOTAL PROTEIN, URINE DIPSTICK: Protein, ur: NEGATIVE mg/dL

## 2020-07-08 MED ORDER — HEPARIN SOD (PORK) LOCK FLUSH 100 UNIT/ML IV SOLN
500.0000 [IU] | Freq: Once | INTRAVENOUS | Status: AC | PRN
  Administered 2020-07-08: 500 [IU]
  Filled 2020-07-08: qty 5

## 2020-07-08 MED ORDER — SODIUM CHLORIDE 0.9% FLUSH
10.0000 mL | INTRAVENOUS | Status: DC | PRN
  Administered 2020-07-08: 10 mL
  Filled 2020-07-08: qty 10

## 2020-07-08 MED ORDER — SODIUM CHLORIDE 0.9 % IV SOLN
Freq: Once | INTRAVENOUS | Status: AC
  Filled 2020-07-08: qty 250

## 2020-07-08 MED ORDER — SODIUM CHLORIDE 0.9 % IV SOLN
200.0000 mg | Freq: Once | INTRAVENOUS | Status: AC
  Administered 2020-07-08: 200 mg via INTRAVENOUS
  Filled 2020-07-08: qty 8

## 2020-07-08 MED ORDER — SODIUM CHLORIDE 0.9% FLUSH
10.0000 mL | Freq: Once | INTRAVENOUS | Status: AC
Start: 2020-07-08 — End: 2020-07-08
  Administered 2020-07-08: 10 mL
  Filled 2020-07-08: qty 10

## 2020-07-08 NOTE — Assessment & Plan Note (Signed)
She is tolerating treatment  She just completed radiation therapy recently The earliest PET CT scan to be done around March 23 or later We will continue treatment with pembrolizumab and Lenvima 

## 2020-07-08 NOTE — Patient Instructions (Signed)
[facility] Discharge Instructions for Patients Receiving Chemotherapy  Today you received the following chemotherapy agents: pembrolizumab.  To help prevent nausea and vomiting after your treatment, we encourage you to take your nausea medication as directed.   If you develop nausea and vomiting that is not controlled by your nausea medication, call the clinic.   BELOW ARE SYMPTOMS THAT SHOULD BE REPORTED IMMEDIATELY:  *FEVER GREATER THAN 100.5 F  *CHILLS WITH OR WITHOUT FEVER  NAUSEA AND VOMITING THAT IS NOT CONTROLLED WITH YOUR NAUSEA MEDICATION  *UNUSUAL SHORTNESS OF BREATH  *UNUSUAL BRUISING OR BLEEDING  TENDERNESS IN MOUTH AND THROAT WITH OR WITHOUT PRESENCE OF ULCERS  *URINARY PROBLEMS  *BOWEL PROBLEMS  UNUSUAL RASH Items with * indicate a potential emergency and should be followed up as soon as possible.  Feel free to call the clinic should you have any questions or concerns. The clinic phone number is [phone].  Please show the CHEMO ALERT CARD at check-in to the Emergency Department and triage nurse.   

## 2020-07-08 NOTE — Patient Instructions (Signed)

## 2020-07-08 NOTE — Progress Notes (Signed)
[place] OFFICE PROGRESS NOTE  Patient Care Team: [name], MD as PCP - General (Family Medicine) Reynold Bowen, MD as Consulting Physician (Endocrinology)  ASSESSMENT & PLAN:  Endometrial cancer [facility]) She is tolerating treatment  She just completed radiation therapy recently The earliest PET CT scan to be done around March 23 or later We will continue treatment with pembrolizumab and Delta hypertension Her blood pressure is well controlled Observe closely for now  Pancytopenia, acquired ([place]) This is due to side effects of treatment She is not symptomatic Observe only  Acquired hypothyroidism She has intermittent elevated TSH I will adjust her thyroid medicine accordingly   No orders of the defined types were placed in this encounter.   All questions were answered. The patient knows to call the clinic with any problems, questions or concerns. The total time spent in the appointment was 20 minutes encounter with patients including review of chart and various tests results, discussions about plan of care and coordination of care plan   [name], MD 07/08/2020 11:03 AM  INTERVAL HISTORY: Please see below for problem oriented charting. She returns for treatment and follow-up She completed radiation therapy last week She had minor skin burn at the radiation site She is worried about her husband who is having recurrent altered mental status secondary to alcoholic liver disease She is doing well Blood pressure control at home is satisfactory  SUMMARY OF ONCOLOGIC HISTORY: Oncology History Overview Note  [employer] testing done 11-2015 on surgical path from 2014: MS stable TMB low 4 muts/mb ATM [id number] ERBB3 T389K E2H2 rearrangement exon 9 PPP2R1A P179R TP53 I195N    ER- APPROXIMATELY 25-35% STAINING IN NEOPLASTIC CELLS (INTERMEDIATE)  PR- APPROXIMATELY 25-35% STAINING IN NEOPLASTIC CELLS (STRONG)  Repeat  biopsy 04/02/17: ER negative, Her 2 negative  Progressed on Doxil   Endometrial cancer ([place])  06/20/2012 Pathology Results   Biopsy positive for papillary serous carcinoma   06/20/2012 Genetic Testing   [employer] testing done 11-2015 on surgical path from 2014: MS stable TMB low 4 muts/mb ATM [id number] ERBB3 T389K E2H2 rearrangement exon 9 PPP2R1A P179R TP53 I195N    06/20/2012 Initial Diagnosis   Patient presented to PCP with intermittent vaginal bleeding since ~ Oct 2013, endometrial biopsy 06-20-12 with complex endometrial hyperplasia with atypia   06/26/2012 Imaging   Thickened endometrial lining in a postmenopausal patient experiencing vaginal bleeding. In the setting of post-menopausal bleeding, endometrial sampling is indicated to exclude carcinoma. No focal myometrial abnormalities are seen.  Normal left ovary and non-visualized right ovary   07/30/2012 Surgery   Dr. Polly Cobia performed robotic hysterectomy with bilateral salpingo-oophorectomy, bilateral pelvic lymph node dissection and periaortic lymph node dissection. Intraoperatively on frozen section, the patient was noted to have a large endometrial polyp with changes within the polyp consistent for high-grade malignancy, possibly papillary serous carcinoma. There is no obvious extrauterine disease noted.     07/30/2012 Pathology Results   [phone] SUPPLEMENTAL REPORT  THE ENDOMETRIAL CARCINOMA WAS ANALYZED FOR DNA MISMATCH REPAIR PROTEINS.IMMUNOHISTOCHEMICALLY, THE NEOPLASM RETAINED NUCLEAR EXPRESSION OF 4 GENE PRODUCTS, MLH1, MSH2, MSH6, AND PMS2, INVOLVED IN DNA MISMATCH REPAIR.POSITIVE AND NEGATIVE CONTROLS WORKED APPROPRIATELY.  PER REQUEST, AN ER AND PR ARE PERFORMED ON BLOCK 1I.  ER- APPROXIMATELY 25-35% STAINING IN NEOPLASTIC CELLS (INTERMEDIATE)  PR- APPROXIMATELY 25-35% STAINING IN NEOPLASTIC CELLS (STRONG)  ER AND PR PREDOMINANTLY SHOW STAINING IN THE SEROUS COMPONENT.  DIAGNOSIS  1.  UTERUS, CERVIX WITH BILATERAL FALLOPIAN TUBES AND OVARIES,  HYSTERECTOMY AND BILATERAL SALPINGO-OOPHORECTOMY:  HIGH GRADE/POORLY DIFFERENTIATED ENDOMETRIAL ADENOCARCINOMA WITH SOLID AND SEROUS PAPILLARY COMPONENTS.  HISTOPATHOLOGIC TYPE:A VARIETY OF PATTERNS WERE PRESENT, ALL OF WHICH SHOULD BE CONSIDERED TO BE HIGH GRADE.SEROUS PAPILLARY CARCINOMA WAS SEEN OCCURRING ADJACENT TO SOLID ENDOMETRIAL CARCINOMA WITH MARKED ANAPLASIA AND GIANT CELLS. SIZE:TUMOR MEASURED AT LEAST 4.8 CM IN GREATEST HORIZONTAL DIMENSION.  GRADE: POORLY DIFFERENTIATED OR HIGH GRADE. DEPTH OF INVASION: NO DEFINITE MYOMETRIAL INVOLVEMENT WAS SEEN.THE TUMOR APPEARED CONFINED TO THE POLYP AS WELL AS SURFACE ENDOMETRIUM. WHERE MYOMETRIAL THICKNESS NOT APPLICABLE. SEROSAL INVOLVEMENT: NOT DEMONSTRATED.  ENDOCERVICAL INVOLVEMENT:NOT DEMONSTRATED. RESECTION MARGINS: FREE OF INVOLVEMENT. EXTRAUTERINE EXTENSION:NOT DEMONSTRATED. ANGIOLYMPHATIC INVASION: NOT DEFINITELY SEEN. TOTAL NODES EXAMINED:22.  PELVIC NODES EXAMINED:20.  PELVIC NODES INVOLVED:0.  PARA-AORTIC NODES 2. EXAMINED:  PARA-AORTIC NODES 0. INVOLVED TNM STAGE: T1A N0 MX. AJCC STAGE GROUPING: IA. FIGO STAGE:IA.   08/26/2012 Imaging   No CT evidence for intra-abdominal or pelvic metastatic disease. Trace free pelvic fluid, presumably postoperative although the date of surgery is not documented in the electronic medical record.   08/26/2012 - 12/13/2012 Chemotherapy   She received 6 cycles of carbo/taxol    10/29/2012 - 11/27/2012 Radiation Therapy   May 27, June 5,  June 11, June 19, November 27, 2012: Proximal vagina 30 Gy in 5 fractions     01/17/2013 Imaging   1.  No evidence of recurrent or metastatic disease. 2.  No acute abnormality involving the abdomen or pelvis. 3.  Mild diffuse hepatic steatosis. 4.  Very small supraumbilical midline anterior abdominal wall  hernia containing fat, unchanged   01/22/2014 Imaging   No evidence for metastatic or recurrent disease. 2. No bowel obstruction.  Normal appendix. 3. Small fat containing hernia, stable in appearance. 4. Status post hysterectomy and bilateral oophorectomy   02/19/2014 Imaging   No pulmonary lesions are identified. The abnormality on the chest x-ray is due to asymmetric left-sided sternoclavicular joint degenerative disease   10/12/2014 Imaging   New mild retroperitoneal lymphadenopathy in the left paraaortic region and proximal left common iliac chain, consistent with metastatic disease. No other sites of metastatic disease identified within the abdomen or pelvis.     11/27/2014 - 02/11/2015 Chemotherapy   She received 4 cycles of carbo/taxol    03/01/2015 PET scan   Single hypermetabolic small retroperitoneal lymph node along the aorta. 2. No evidence of metastatic disease otherwise in the abdomen or pelvis. No evidence local recurrence. 3. Intensely hypermetabolic enlarged nodule adjacent to the RIGHT lobe of thyroid gland. This presumably represents the biopsied lesion in clinician report which was found to be benign thyroid tissue.   04/05/2015 - 05/13/2015 Radiation Therapy   She received 50.4 gray in 28 fractions with simultaneous integrated boost to 56 gray   06/17/2015 Imaging   No acute process or evidence of metastatic disease in the abdomen or pelvis. Resolution of previously described retroperitoneal adenopathy. 2.  Possible constipation. 3. Atherosclerosis.   06/17/2015 Tumor Marker   Patient's tumor was tested for the following markers: CA125 Results of the tumor marker test revealed 33   08/12/2015 Tumor Marker   Patient's tumor was tested for the following markers: CA125 Results of the tumor marker test revealed 52   08/31/2015 PET scan   Development of right paratracheal hypermetabolic adenopathy, consistent with nodal metastasis. 2. The previously described isolated  abdominal retroperitoneal hypermetabolic node has resolved. 3. Persistent hypermetabolic right thyroid nodule, per report previously biopsied. Correlate with those results. 4.  Possible constipation.   09/09/2015 -  Anti-estrogen oral therapy   She has been receiving alternative treatment between megace and tamoxifen   09/09/2015 Tumor Marker   Patient's tumor was tested for the following markers: CA125 Results of the tumor marker test revealed 37.8   09/20/2015 Procedure   Technically successful ultrasound-guided thyroid aspiration biopsy , dominant right nodule   09/20/2015 Pathology Results   THYROID, RIGHT, FINE NEEDLE ASPIRATION (SPECIMEN 1 OF 1 COLLECTED 09/20/15): FINDINGS CONSISTENT WITH BENIGN THYROID NODULE (BETHESDA CATEGORY II).   10/28/2015 Tumor Marker   Patient's tumor was tested for the following markers: CA125 Results of the tumor marker test revealed 53.2   11/04/2015 Imaging   Stable benign right thyroid nodule   12/16/2015 Tumor Marker   Patient's tumor was tested for the following markers: CA125 Results of the tumor marker test revealed 38.1   03/22/2016 PET scan   Interval progression of hypermetabolic right paratracheal lymph node consistent with metastatic involvement. 2. Stable hypermetabolic right thyroid nodule. Reportedly this has been biopsied in the past.   03/23/2016 Tumor Marker   Patient's tumor was tested for the following markers: CA125 Results of the tumor marker test revealed 62.4   04/13/2016 - 06/01/2016 Radiation Therapy   She received 56 Gy to the chest in 28 fractions    05/09/2016 Imaging   No evidence of left lower extremity deep vein thrombosis. No evidence of a superficial thrombosis of the greater and lesser saphenous veins. Positive for thrombus noted in several varicosities of the calf. No evidence of Baker's cyst on the left.   05/17/2016 Tumor Marker   Patient's tumor was tested for the following markers: CA125 Results of the tumor  marker test revealed 9   06/29/2016 Tumor Marker   Patient's tumor was tested for the following markers: CA125 Results of the tumor marker test revealed 5.9   08/04/2016 Tumor Marker   Patient's tumor was tested for the following markers: CA125 Results of the tumor marker test revealed 6.0   09/12/2016 PET scan   Complete metabolic response to therapy, with resolution of hypermetabolic mediastinal lymphadenopathy since prior exam. No residual or new metastatic disease identified. Stable hypermetabolic right thyroid lobe nodule, which was previously biopsied on 09/20/2015.   03/13/2017 PET scan   1. Solitary focus of recurrent right paratracheal hypermetabolic activity, with a 1.0 cm right lower paratracheal node having a maximum SUV of 9.0. Appearance compatible with recurrent malignancy. 2. Continued hypermetabolic right thyroid nodule, previously biopsied, presumed benign -correlate with prior biopsy results. 3. Other imaging findings of potential clinical significance: Aortic Atherosclerosis (ICD10-I70.0). Mild cardiomegaly. Prominent stool throughout the colon favors constipation.   04/02/2017 Pathology Results   FINE NEEDLE ASPIRATION, ENDOSCOPIC, (EBUS) 4 R NODE (SPECIMEN 1 OF 2 COLLECTED 04/02/17): MALIGNANT CELLS PRESENT, CONSISTENT WITH CARCINOMA. SEE COMMENT. COMMENT: THE MALIGNANT CELLS ARE POSITIVE FOR P53 AND NEGATIVE FOR ESTROGEN RECEPTOR AND TTF-1. THIS PROFILE IS NON-SPECIFIC, BUT THE P53 POSITIVE STAINING IS SUGGESTIVE OF GYNECOLOGIC PRIMARY.    04/11/2017 Procedure   Successful 8 French right internal jugular vein power port placement with its tip at the SVC/RA junction   04/12/2017 Imaging   Normal LV size with mild LV hypertrophy. EF 55-60%. Normal RV size and systolic function. Aortic valve sclerosis without significant stenosis.   04/18/2017 - 06/14/2017 Chemotherapy   The patient had chemotherapy with Doxil    07/10/2017 Imaging   - Left ventricle: The cavity size  was normal. There was mild concentric hypertrophy. Systolic function was normal. The estimated ejection fraction was  in the range of 55% to 60%. Wall motion was normal; there were no regional wall motion abnormalities. Doppler parameters are consistent with abnormal left ventricular relaxation (grade 1 diastolic dysfunction). - Aortic valve: Noncoronary cusp mobility was mildly restricted. - Mitral valve: There was mild regurgitation. - Left atrium: The atrium was mildly dilated. - Atrial septum: No defect or patent foramen ovale was identified.  Impressions:  - Compared to November 2018, global LV longitudinal strain remains normal (has increased)   07/11/2017 PET scan   Increased size and hypermetabolic activity of 3.3 cm right thyroid lobe nodule. Thyroid carcinoma cannot be excluded. Recommend repeat ultrasound guided fine needle aspiration to exclude thyroid carcinoma.  New adjacent hypermetabolic 10 mm right supraclavicular lymph node, suspicious for lymph node metastasis.  Slight increase in size and hypermetabolic activity of solitary right paratracheal lymph node.  No evidence of abdominal or pelvic metastatic disease.   07/18/2017 Procedure   1. Technically successful ultrasound guided fine needle aspiration of indeterminate hypermetabolic right-sided thyroid nodule/mass. 2. Technically successful ultrasound-guided core needle biopsy of hypermetabolic right lower cervical lymph node.   07/18/2017 Pathology Results   THYROID, FINE NEEDLE ASPIRATION RIGHT (SPECIMEN 1 OF 1, COLLECTED ON 07/18/2017): ATYPIA OF UNDETERMINED SIGNIFICANCE OR FOLLICULAR LESION OF UNDETERMINED SIGNIFICANCE (BETHESDA CATEGORY III). SEE COMMENT. COMMENT: THE SPECIMEN CONSISTS OF SMALL AND MEDIUM SIZED GROUPS OF FOLLICULAR EPITHELIAL CELLS WITH MILD CYTOLOGIC ATYPIA INCLUDING NUCLEAR ENLARGEMENT AND HURTHLE CELL CHANGE. SOME GROUPS ARE ARRANGED AS MICROFOLLICLES. THERE IS MINIMAL BACKGROUND COLLOID. BASED ON  THESE FEATURES, A FOLLICULAR LESION/NEOPLASM CAN NOT BE ENTIRELY RULED OUT. A SPECIMEN WILL BE SENT FOR AFIRMA TESTING.   07/19/2017 Pathology Results   Lymph node, needle/core biopsy - METASTATIC PAPILLARY SEROUS CARCINOMA. - SEE COMMENT. Microscopic Comment Dr. Vicente Males has reviewed the case and concurs with this interpretation   10/15/2017 PET scan   No new or progressive disease. No evidence of abdominal or pelvic metastatic disease.  Stable hypermetabolic right thyroid lobe nodule and adjacent right supraclavicular lymph node.  Decreased size and hypermetabolic activity of solitary right paratracheal lymph node.   01/23/2018 PET scan   1. Overall, no significant change from PET-CT of 3 months ago. There is persistent hypermetabolic activity at the right thoracic inlet and within a right paratracheal mediastinal node. The nodes have not significantly changed in size, although the metabolic activity has minimally increased over this interval. It is uncertain how much of the thoracic inlet metabolic activity is attributed to the thyroid nodule versus adjacent cervical lymph nodes, both previously biopsied. 2. No new hypermetabolic activity within the neck, chest, abdomen or pelvis. No evidence of local recurrence in the pelvis. 3. Stable probable radiation changes in the right lung.   04/16/2018 PET scan   1. Interval increase in metabolic activity of the RIGHT supraclavicular node and RIGHT lower paratracheal mediastinal node with minimal change in size. Findings consistent with persistent and mildly progressed metastatic adenopathy. 2. New hypermetabolic small lymph node in the RIGHT lower paratracheal nodal station adjacent to the previously followed node. 3. No evidence of local recurrence in the pelvis or new disease elsewhere.   07/30/2018 PET scan   1. Mild interval progression of right supraclavicular, mediastinal, and right hilar hypermetabolic metastatic disease. There is a  new hypermetabolic lymph node in the subcarinal station on today's study. 2. No hypermetabolic disease in the abdomen or pelvis to suggest recurrent/metastases. 3.  Aortic Atherosclerois (ICD10-170.0)    08/16/2018 -  Chemotherapy   The patient  had Lenvima since 3/6 and pembrolizumab since 3/13 for chemotherapy treatment. Michel Santee was held temporarily due to infection and severe hypertension   10/31/2018 PET scan   Partial response to therapy, with decreased hypermetabolic lymphadenopathy in mediastinum and right hilar region.  No significant change in hypermetabolic lymphadenopathy in right inferior neck.  No new or progressive metastatic disease identified. No evidence of recurrent or metastatic carcinoma in the pelvis or abdomen   02/04/2019 PET scan   1. Decrease in metabolic activity of RIGHT supraclavicular node and RIGHT paratracheal lymph nodes. 2. Persistent activity in the RIGHT thyroid nodule unchanged. 3. Diffuse activity through the esophagus is favored esophagitis. No change. 4. Scattered foci of intense metabolic activity associated the bowel without focal lesion on CT favored benign. 5. No evidence of peritoneal metastasis. 6. No evidence of metastatic adenopathy in the abdomen pelvis. 7. No evidence of local pelvic sidewall recurrence.   07/30/2019 PET scan   1. Mildly increased uptake within small nodes along the right sternocleidomastoid without change in size and associated with increased deltoid activity may reflect changes related to right arm injection or recent COVID vaccination, consider clinical correlation and attention on follow-up. 2. Persistent activity in the right thyroid nodule, unchanged from previous exams. 3. New activity in a juxta esophageal lymph node in the chest is suspicious though the node is unchanged with respect to size. Endoscopic correlation could potentially be helpful or close attention on follow-up. 4. Subtle increased FDG uptake along the  left mainstem bronchus and adjacent to the aorta may represent a small lymph node. No discrete correlate is demonstrated on today's study.   10/22/2019 PET scan   1. Persistent FDG avid subcentimeter right supraclavicular and juxta esophageal lymph nodes. The degree of FDG uptake is similar to the previous exam. No new or progressive findings identified. 2. No findings of solid organ metastasis, skeletal metastasis or metastatic disease to the abdomen or pelvis. 3. FDG avid right lobe of thyroid gland nodule is again noted This nodule is not incidental and been worked up previously with 2 biopsies. Please refer to results from the most recent biopsy dated 07/18/2017. (Ref: J Am Coll Radiol. 2015 Feb;12(2): 143-50). 4.  Aortic Atherosclerosis (ICD10-I70.0).   05/05/2020 PET scan   1. Progressive disease, as evidenced by increased hypermetabolism within right supraclavicular and mediastinal nodes. 2. No infradiaphragmatic hypermetabolic metastasis. 3.  Aortic Atherosclerosis (ICD10-I70.0). 4. 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)  07/11/2017 Initial Diagnosis   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[place])   08/16/2018 -  Chemotherapy   The patient had pembrolizumab (KEYTRUDA) 200 mg in sodium chloride 0.9 % 50 mL chemo infusion, 200 mg, Intravenous, Once, 27 of 28 cycles Administration: 200 mg (08/16/2018), 200 mg (09/06/2018), 200 mg (09/30/2018), 200 mg (10/21/2018), 200 mg (11/11/2018), 200 mg (12/02/2018), 200 mg (12/23/2018), 200 mg (01/13/2019), 200 mg (02/05/2019), 200 mg (02/27/2019), 200 mg (03/20/2019), 200 mg (04/10/2019), 200 mg (05/08/2019), 200 mg (05/29/2019), 200 mg (06/19/2019), 200 mg (07/10/2019), 200 mg (07/31/2019), 200 mg (08/21/2019), 200 mg (09/11/2019), 200 mg (10/02/2019), 200 mg (10/23/2019), 200 mg (11/13/2019), 200 mg (12/04/2019), 200 mg (12/25/2019), 200 mg (01/15/2020), 200 mg (02/05/2020),  200 mg (02/26/2020)  for chemotherapy treatment.      REVIEW OF SYSTEMS:   Constitutional: Denies fevers, chills or abnormal weight loss Eyes: Denies blurriness of vision Ears, nose, mouth, throat, and face: Denies mucositis or sore throat  Respiratory: Denies cough, dyspnea or wheezes Cardiovascular: Denies palpitation, chest discomfort or lower extremity swelling Gastrointestinal:  Denies nausea, heartburn or change in bowel habits Lymphatics: Denies new lymphadenopathy or easy bruising Neurological:Denies numbness, tingling or new weaknesses Behavioral/Psych: Mood is stable, no new changes  All other systems were reviewed with the patient and are negative.  I have reviewed the past medical history, past surgical history, social history and family history with the patient and they are unchanged from previous note.  ALLERGIES:  is allergic to other.  MEDICATIONS:  Current Outpatient Medications  Medication Sig Dispense Refill  . amLODipine (NORVASC) 10 MG tablet TAKE 1 TABLET BY MOUTH ONCE DAILY. **DECREASE SIMVASTATIN TO 20 MG DAILY WHILE ON AMLODIPINE PER MD** 90 tablet 1  . aspirin 81 MG EC tablet Take by mouth. (Patient not taking: Reported on 05/12/2020)    . busPIRone (BUSPAR) 10 MG tablet Take 10 mg by mouth 2 (two) times daily.    . Calcium Carb-Cholecalciferol 500-600 MG-UNIT TABS Take 1 tablet by mouth daily.     . Cholecalciferol (VITAMIN D3) 2000 units TABS Take 2,000 Units by mouth daily.     . hydrochlorothiazide (MICROZIDE) 12.5 MG capsule Take 12.5 mg by mouth daily.    Marland Kitchen lenvatinib 10 mg daily dose (LENVIMA, 10 MG DAILY DOSE,) capsule Take 1 capsule (10 mg total) by mouth daily. 30 capsule 11  . levothyroxine (SYNTHROID) 75 MCG tablet TAKE 1 TABLET (75 MCG TOTAL) BY MOUTH DAILY BEFORE BREAKFAST. 90 tablet 2  . lidocaine-prilocaine (EMLA) cream APPLY TO AFFECTED AREA ONCE AS DIRECTED 30 g 3  . LORazepam (ATIVAN) 0.5 MG tablet Take 0.5 mg by mouth 2 (two) times daily as  needed. (Patient not taking: Reported on 05/12/2020)  0  . losartan (COZAAR) 25 MG tablet Take 1 tablet (25 mg total) by mouth daily. 30 tablet 11  . metoprolol tartrate (LOPRESSOR) 25 MG tablet TAKE 1 TABLET (25 MG TOTAL) BY MOUTH 2 (TWO) TIMES DAILY. 180 tablet 3  . ondansetron (ZOFRAN) 8 MG tablet Take 1 tablet (8 mg total) by mouth every 8 (eight) hours as needed for nausea. (Patient not taking: Reported on 05/12/2020) 30 tablet 3  . pantoprazole (PROTONIX) 40 MG tablet Take 1 tablet (40 mg total) by mouth daily. 90 tablet 3  . simvastatin (ZOCOR) 20 MG tablet SMARTSIG:1 Tablet(s) By Mouth Every Evening    . tacrolimus (PROTOPIC) 0.1 % ointment Apply 1 application topically daily as needed (rash).     . venlafaxine XR (EFFEXOR-XR) 150 MG 24 hr capsule Take 300 mg by mouth every morning.     No current facility-administered medications for this visit.   Facility-Administered Medications Ordered in Other Visits  Medication Dose Route Frequency Provider Last Rate Last Admin  . heparin lock flush 100 unit/mL  500 Units Intracatheter Once PRN Alvy Bimler, Rakeem Colley, MD      . pembrolizumab (KEYTRUDA) 200 mg in sodium chloride 0.9 % 50 mL chemo infusion  200 mg Intravenous Once Kerrilynn Derenzo, MD      . sodium chloride flush (NS) 0.9 % injection 10 mL  10 mL Intracatheter PRN Alvy Bimler, Charliee Krenz, MD        PHYSICAL EXAMINATION: ECOG PERFORMANCE STATUS: 1 - Symptomatic but completely ambulatory  Vitals:   07/08/20 1017  BP: 126/81  Pulse: 72  Resp: 20  Temp: (!) 96.8 F (36 C)  SpO2: 99%   Filed Weights   07/08/20 1017  Weight: 155 lb (70.3 kg)    GENERAL:alert,  no distress and comfortable SKIN: Noted minor skin burn at radiation site near the right supraclavicular region EYES: normal, Conjunctiva are pink and non-injected, sclera clear OROPHARYNX:no exudate, no erythema and lips, buccal mucosa, and tongue normal  NECK: supple, thyroid normal size, non-tender, without nodularity LYMPH:  no palpable  lymphadenopathy in the cervical, axillary or inguinal LUNGS: clear to auscultation and percussion with normal breathing effort HEART: regular rate & rhythm and no murmurs and no lower extremity edema ABDOMEN:abdomen soft, non-tender and normal bowel sounds Musculoskeletal:no cyanosis of digits and no clubbing  NEURO: alert & oriented x 3 with fluent speech, no focal motor/sensory deficits  LABORATORY DATA:  I have reviewed the data as listed    Component Value Date/Time   NA 137 07/08/2020 1012   NA 141 05/17/2017 0957   K 4.1 07/08/2020 1012   K 3.6 05/17/2017 0957   CL 103 07/08/2020 1012   CL 105 11/19/2012 0848   CO2 28 07/08/2020 1012   CO2 25 05/17/2017 0957   GLUCOSE 91 07/08/2020 1012   GLUCOSE 85 05/17/2017 0957   GLUCOSE 122 (H) 11/19/2012 0848   BUN 24 (H) 07/08/2020 1012   BUN 15.2 05/17/2017 0957   CREATININE 0.89 07/08/2020 1012   CREATININE 0.83 07/10/2019 1240   CREATININE 0.8 05/17/2017 0957   CALCIUM 9.7 07/08/2020 1012   CALCIUM 9.6 05/17/2017 0957   PROT 7.2 07/08/2020 1012   PROT 7.0 05/17/2017 0957   ALBUMIN 3.8 07/08/2020 1012   ALBUMIN 3.9 05/17/2017 0957   AST 23 07/08/2020 1012   AST 23 07/10/2019 1240   AST 25 05/17/2017 0957   ALT 18 07/08/2020 1012   ALT 20 07/10/2019 1240   ALT 18 05/17/2017 0957   ALKPHOS 51 07/08/2020 1012   ALKPHOS 57 05/17/2017 0957   BILITOT 0.7 07/08/2020 1012   BILITOT 0.4 07/10/2019 1240   BILITOT 0.47 05/17/2017 0957   GFRNONAA >60 07/08/2020 1012   GFRNONAA >60 07/10/2019 1240   GFRAA >60 02/26/2020 0940   GFRAA >60 07/10/2019 1240    No results found for: SPEP, UPEP  Lab Results  Component Value Date   WBC 3.7 (L) 07/08/2020   NEUTROABS 2.6 07/08/2020   HGB 10.8 (L) 07/08/2020   HCT 32.9 (L) 07/08/2020   MCV 95.1 07/08/2020   PLT 165 07/08/2020      Chemistry      Component Value Date/Time   NA 137 07/08/2020 1012   NA 141 05/17/2017 0957   K 4.1 07/08/2020 1012   K 3.6 05/17/2017 0957   CL  103 07/08/2020 1012   CL 105 11/19/2012 0848   CO2 28 07/08/2020 1012   CO2 25 05/17/2017 0957   BUN 24 (H) 07/08/2020 1012   BUN 15.2 05/17/2017 0957   CREATININE 0.89 07/08/2020 1012   CREATININE 0.83 07/10/2019 1240   CREATININE 0.8 05/17/2017 0957   GLU 158 (H) 01/14/2015 1035      Component Value Date/Time   CALCIUM 9.7 07/08/2020 1012   CALCIUM 9.6 05/17/2017 0957   ALKPHOS 51 07/08/2020 1012   ALKPHOS 57 05/17/2017 0957   AST 23 07/08/2020 1012   AST 23 07/10/2019 1240   AST 25 05/17/2017 0957   ALT 18 07/08/2020 1012   ALT 20 07/10/2019 1240   ALT 18 05/17/2017 0957   BILITOT 0.7 07/08/2020 1012   BILITOT 0.4 07/10/2019 1240   BILITOT 0.47 05/17/2017 0957

## 2020-07-08 NOTE — Assessment & Plan Note (Signed)
Her blood pressure is well controlled Observe closely for now

## 2020-07-08 NOTE — Assessment & Plan Note (Signed)
She has intermittent elevated TSH I will adjust her thyroid medicine accordingly 

## 2020-07-08 NOTE — Assessment & Plan Note (Signed)
This is due to side effects of treatment She is not symptomatic Observe only 

## 2020-07-16 ENCOUNTER — Other Ambulatory Visit (HOSPITAL_COMMUNITY): Payer: Self-pay | Admitting: *Deleted

## 2020-07-19 MED FILL — VENLAFAXINE HCL ER 150 MG C: 150 | 90 days supply | Qty: 180 | Fill #0

## 2020-07-23 MED FILL — LOSARTAN POTASSIUM 25 MG TA: 25 | 30 days supply | Qty: 30 | Fill #4

## 2020-07-26 ENCOUNTER — Other Ambulatory Visit: Payer: Self-pay | Admitting: Family Medicine

## 2020-07-26 DIAGNOSIS — Z1231 Encounter for screening mammogram for malignant neoplasm of breast: Secondary | ICD-10-CM

## 2020-07-26 DIAGNOSIS — E7800 Pure hypercholesterolemia, unspecified: Secondary | ICD-10-CM | POA: Diagnosis not present

## 2020-07-26 DIAGNOSIS — F411 Generalized anxiety disorder: Secondary | ICD-10-CM | POA: Diagnosis not present

## 2020-07-26 DIAGNOSIS — Z0000 Encounter for general adult medical examination without abnormal findings: Secondary | ICD-10-CM | POA: Diagnosis not present

## 2020-07-26 DIAGNOSIS — M859 Disorder of bone density and structure, unspecified: Secondary | ICD-10-CM | POA: Diagnosis not present

## 2020-07-26 DIAGNOSIS — M8580 Other specified disorders of bone density and structure, unspecified site: Secondary | ICD-10-CM

## 2020-07-26 DIAGNOSIS — E663 Overweight: Secondary | ICD-10-CM | POA: Diagnosis not present

## 2020-07-26 DIAGNOSIS — Z6826 Body mass index (BMI) 26.0-26.9, adult: Secondary | ICD-10-CM | POA: Diagnosis not present

## 2020-07-26 DIAGNOSIS — C770 Secondary and unspecified malignant neoplasm of lymph nodes of head, face and neck: Secondary | ICD-10-CM | POA: Diagnosis not present

## 2020-07-26 DIAGNOSIS — E559 Vitamin D deficiency, unspecified: Secondary | ICD-10-CM | POA: Diagnosis not present

## 2020-07-26 DIAGNOSIS — C541 Malignant neoplasm of endometrium: Secondary | ICD-10-CM | POA: Diagnosis not present

## 2020-07-28 MED FILL — LENVIMA 10 MG DAILY DOSE: 10 | 30 days supply | Qty: 30 | Fill #2

## 2020-07-29 ENCOUNTER — Encounter: Payer: Self-pay | Admitting: *Deleted

## 2020-07-29 ENCOUNTER — Inpatient Hospital Stay (HOSPITAL_BASED_OUTPATIENT_CLINIC_OR_DEPARTMENT_OTHER): Payer: 59 | Admitting: Hematology and Oncology

## 2020-07-29 ENCOUNTER — Other Ambulatory Visit: Payer: Self-pay | Admitting: Hematology and Oncology

## 2020-07-29 ENCOUNTER — Inpatient Hospital Stay: Payer: 59

## 2020-07-29 ENCOUNTER — Other Ambulatory Visit: Payer: Self-pay

## 2020-07-29 DIAGNOSIS — I10 Essential (primary) hypertension: Secondary | ICD-10-CM | POA: Diagnosis not present

## 2020-07-29 DIAGNOSIS — C541 Malignant neoplasm of endometrium: Secondary | ICD-10-CM

## 2020-07-29 DIAGNOSIS — Z7189 Other specified counseling: Secondary | ICD-10-CM

## 2020-07-29 DIAGNOSIS — E039 Hypothyroidism, unspecified: Secondary | ICD-10-CM | POA: Diagnosis not present

## 2020-07-29 DIAGNOSIS — D61818 Other pancytopenia: Secondary | ICD-10-CM

## 2020-07-29 DIAGNOSIS — C770 Secondary and unspecified malignant neoplasm of lymph nodes of head, face and neck: Secondary | ICD-10-CM | POA: Diagnosis not present

## 2020-07-29 DIAGNOSIS — Z9221 Personal history of antineoplastic chemotherapy: Secondary | ICD-10-CM | POA: Diagnosis not present

## 2020-07-29 DIAGNOSIS — Z95828 Presence of other vascular implants and grafts: Secondary | ICD-10-CM

## 2020-07-29 DIAGNOSIS — Z923 Personal history of irradiation: Secondary | ICD-10-CM | POA: Diagnosis not present

## 2020-07-29 DIAGNOSIS — Z5112 Encounter for antineoplastic immunotherapy: Secondary | ICD-10-CM | POA: Diagnosis not present

## 2020-07-29 LAB — COMPREHENSIVE METABOLIC PANEL
ALT: 16 U/L (ref 0–44)
AST: 21 U/L (ref 15–41)
Albumin: 3.8 g/dL (ref 3.5–5.0)
Alkaline Phosphatase: 49 U/L (ref 38–126)
Anion gap: 8 (ref 5–15)
BUN: 25 mg/dL — ABNORMAL HIGH (ref 8–23)
CO2: 27 mmol/L (ref 22–32)
Calcium: 9.3 mg/dL (ref 8.9–10.3)
Chloride: 102 mmol/L (ref 98–111)
Creatinine, Ser: 0.86 mg/dL (ref 0.44–1.00)
GFR, Estimated: >60 mL/min (ref >60)
Glucose, Bld: 93 mg/dL (ref 70–99)
Potassium: 3.6 mmol/L (ref 3.5–5.1)
Sodium: 137 mmol/L (ref 135–145)
Total Bilirubin: 0.5 mg/dL (ref 0.3–1.2)
Total Protein: 7 g/dL (ref 6.5–8.1)

## 2020-07-29 LAB — CBC WITH DIFFERENTIAL/PLATELET
Abs Immature Granulocytes: 0.01 10*3/uL (ref 0.00–0.07)
Basophils Absolute: 0 10*3/uL (ref 0.0–0.1)
Basophils Relative: 1 %
Eosinophils Absolute: 0.2 10*3/uL (ref 0.0–0.5)
Eosinophils Relative: 5 %
HCT: 31.2 % — ABNORMAL LOW (ref 36.0–46.0)
Hemoglobin: 10.2 g/dL — ABNORMAL LOW (ref 12.0–15.0)
Immature Granulocytes: 0 %
Lymphocytes Relative: 20 %
Lymphs Abs: 0.7 10*3/uL (ref 0.7–4.0)
MCH: 31.4 pg (ref 26.0–34.0)
MCHC: 32.7 g/dL (ref 30.0–36.0)
MCV: 96 fL (ref 80.0–100.0)
Monocytes Absolute: 0.3 10*3/uL (ref 0.1–1.0)
Monocytes Relative: 8 %
Neutro Abs: 2.2 10*3/uL (ref 1.7–7.7)
Neutrophils Relative %: 66 %
Platelets: 179 10*3/uL (ref 150–400)
RBC: 3.25 MIL/uL — ABNORMAL LOW (ref 3.87–5.11)
RDW: 13.8 % (ref 11.5–15.5)
WBC: 3.3 10*3/uL — ABNORMAL LOW (ref 4.0–10.5)
nRBC: 0 % (ref 0.0–0.2)

## 2020-07-29 LAB — TSH: TSH: 2.318 u[IU]/mL (ref 0.308–3.960)

## 2020-07-29 MED ORDER — SODIUM CHLORIDE 0.9% FLUSH
10.0000 mL | Freq: Once | INTRAVENOUS | Status: AC
  Administered 2020-07-29: 10 mL
  Filled 2020-07-29: qty 10

## 2020-07-29 MED ORDER — LOSARTAN POTASSIUM 25 MG PO TABS: 25.0000 mg | ORAL_TABLET | Freq: Every day | ORAL | 3 refills | Status: DC

## 2020-07-29 MED ORDER — SODIUM CHLORIDE 0.9 % IV SOLN
Freq: Once | INTRAVENOUS | Status: AC
  Filled 2020-07-29: qty 250

## 2020-07-29 MED ORDER — SODIUM CHLORIDE 0.9 % IV SOLN
200.0000 mg | Freq: Once | INTRAVENOUS | Status: AC
  Administered 2020-07-29: 200 mg via INTRAVENOUS
  Filled 2020-07-29: qty 8

## 2020-07-29 MED ORDER — SODIUM CHLORIDE 0.9% FLUSH
10.0000 mL | INTRAVENOUS | Status: DC | PRN
  Administered 2020-07-29: 10 mL
  Filled 2020-07-29: qty 10

## 2020-07-29 MED ORDER — HEPARIN SOD (PORK) LOCK FLUSH 100 UNIT/ML IV SOLN
500.0000 [IU] | Freq: Once | INTRAVENOUS | Status: AC | PRN
  Administered 2020-07-29: 500 [IU]
  Filled 2020-07-29: qty 5

## 2020-07-29 NOTE — Assessment & Plan Note (Signed)
She has intermittent elevated TSH I will adjust her thyroid medicine accordingly 

## 2020-07-29 NOTE — Assessment & Plan Note (Signed)
She is tolerating treatment  She just completed radiation therapy recently The earliest PET CT scan to be done around March 23 or later We will continue treatment with pembrolizumab and Lenvima

## 2020-07-29 NOTE — Assessment & Plan Note (Signed)
This is due to side effects of treatment She is not symptomatic Observe only 

## 2020-07-29 NOTE — Patient Instructions (Signed)
Implanted Port Insertion, Care After This sheet gives you information about how to care for yourself after your procedure. Your health care provider may also give you more specific instructions. If you have problems or questions, contact your health care provider. What can I expect after the procedure? After the procedure, it is common to have:  Discomfort at the port insertion site.  Bruising on the skin over the port. This should improve over 3-4 days. Follow these instructions at home: Port care  After your port is placed, you will get a manufacturer's information card. The card has information about your port. Keep this card with you at all times.  Take care of the port as told by your health care provider. Ask your health care provider if you or a family member can get training for taking care of the port at home. A home health care nurse may also take care of the port.  Make sure to remember what type of port you have. Incision care  Follow instructions from your health care provider about how to take care of your port insertion site. Make sure you: ? Wash your hands with soap and water before and after you change your bandage (dressing). If soap and water are not available, use hand sanitizer. ? Change your dressing as told by your health care provider. ? Leave stitches (sutures), skin glue, or adhesive strips in place. These skin closures may need to stay in place for 2 weeks or longer. If adhesive strip edges start to loosen and curl up, you may trim the loose edges. Do not remove adhesive strips completely unless your health care provider tells you to do that.  Check your port insertion site every day for signs of infection. Check for: ? Redness, swelling, or pain. ? Fluid or blood. ? Warmth. ? Pus or a bad smell.      Activity  Return to your normal activities as told by your health care provider. Ask your health care provider what activities are safe for you.  Do not  lift anything that is heavier than 10 lb (4.5 kg), or the limit that you are told, until your health care provider says that it is safe. General instructions  Take over-the-counter and prescription medicines only as told by your health care provider.  Do not take baths, swim, or use a hot tub until your health care provider approves. Ask your health care provider if you may take showers. You may only be allowed to take sponge baths.  Do not drive for 24 hours if you were given a sedative during your procedure.  Wear a medical alert bracelet in case of an emergency. This will tell any health care providers that you have a port.  Keep all follow-up visits as told by your health care provider. This is important. Contact a health care provider if:  You cannot flush your port with saline as directed, or you cannot draw blood from the port.  You have a fever or chills.  You have redness, swelling, or pain around your port insertion site.  You have fluid or blood coming from your port insertion site.  Your port insertion site feels warm to the touch.  You have pus or a bad smell coming from the port insertion site. Get help right away if:  You have chest pain or shortness of breath.  You have bleeding from your port that you cannot control. Summary  Take care of the port as told by your   health care provider. Keep the manufacturer's information card with you at all times.  Change your dressing as told by your health care provider.  Contact a health care provider if you have a fever or chills or if you have redness, swelling, or pain around your port insertion site.  Keep all follow-up visits as told by your health care provider. This information is not intended to replace advice given to you by your health care provider. Make sure you discuss any questions you have with your health care provider. Document Revised: 12/18/2017 Document Reviewed: 12/18/2017 [employer] Patient Education   2021 [employer]  

## 2020-07-29 NOTE — Progress Notes (Signed)
[facility] OFFICE PROGRESS NOTE  Patient Care Team: [name], MD as PCP - General (Family Medicine) Reynold Bowen, MD as Consulting Physician (Endocrinology)  ASSESSMENT & PLAN:  Endometrial cancer [facility]) She is tolerating treatment  She just completed radiation therapy recently The earliest PET CT scan to be done around March 23 or later We will continue treatment with pembrolizumab and Lenvima  Pancytopenia, acquired [facility]) This is due to side effects of treatment She is not symptomatic Observe only  Acquired hypothyroidism She has intermittent elevated TSH I will adjust her thyroid medicine accordingly   No orders of the defined types were placed in this encounter.   All questions were answered. The patient knows to call the clinic with any problems, questions or concerns. The total time spent in the appointment was 20 minutes encounter with patients including review of chart and various tests results, discussions about plan of care and coordination of care plan   [name], MD 07/29/2020 1:55 PM  INTERVAL HISTORY: Please see below for problem oriented charting. She returns for treatment and follow-up She is doing well No side effects from treatment so far Her blood pressure is under excellent control No new lymphadenopathy The patient is quite tearful Her husband is currently under hospice care for liver failure She is getting a lot of support to take care of her husband  SUMMARY OF ONCOLOGIC HISTORY: Oncology History Overview Note  [employer] testing done 11-2015 on surgical path from 2014: MS stable TMB low 4 muts/mb ATM [id number] ERBB3 T389K E2H2 rearrangement exon 9 PPP2R1A P179R TP53 I195N    ER- APPROXIMATELY 25-35% STAINING IN NEOPLASTIC CELLS (INTERMEDIATE)  PR- APPROXIMATELY 25-35% STAINING IN NEOPLASTIC CELLS (STRONG)  Repeat biopsy 04/02/17: ER negative, Her 2 negative  Progressed on Doxil   Endometrial  cancer ([place])  06/20/2012 Pathology Results   Biopsy positive for papillary serous carcinoma   06/20/2012 Genetic Testing   Foundation One testing done 11-2015 on surgical path from 2014: MS stable TMB low 4 muts/mb ATM T02409 ERBB3 T389K E2H2 rearrangement exon 9 PPP2R1A P179R TP53 I195N    06/20/2012 Initial Diagnosis   Patient presented to PCP with intermittent vaginal bleeding since ~ Oct 2013, endometrial biopsy 06-20-12 with complex endometrial hyperplasia with atypia   06/26/2012 Imaging   Thickened endometrial lining in a postmenopausal patient experiencing vaginal bleeding. In the setting of post-menopausal bleeding, endometrial sampling is indicated to exclude carcinoma. No focal myometrial abnormalities are seen.  Normal left ovary and non-visualized right ovary   07/30/2012 Surgery   Dr. Polly Cobia performed robotic hysterectomy with bilateral salpingo-oophorectomy, bilateral pelvic lymph node dissection and periaortic lymph node dissection. Intraoperatively on frozen section, the patient was noted to have a large endometrial polyp with changes within the polyp consistent for high-grade malignancy, possibly papillary serous carcinoma. There is no obvious extrauterine disease noted.     07/30/2012 Pathology Results   [phone] SUPPLEMENTAL REPORT  THE ENDOMETRIAL CARCINOMA WAS ANALYZED FOR DNA MISMATCH REPAIR PROTEINS.IMMUNOHISTOCHEMICALLY, THE NEOPLASM RETAINED NUCLEAR EXPRESSION OF 4 GENE PRODUCTS, MLH1, MSH2, MSH6, AND PMS2, INVOLVED IN DNA MISMATCH REPAIR.POSITIVE AND NEGATIVE CONTROLS WORKED APPROPRIATELY.  PER REQUEST, AN ER AND PR ARE PERFORMED ON BLOCK 1I.  ER- APPROXIMATELY 25-35% STAINING IN NEOPLASTIC CELLS (INTERMEDIATE)  PR- APPROXIMATELY 25-35% STAINING IN NEOPLASTIC CELLS (STRONG)  ER AND PR PREDOMINANTLY SHOW STAINING IN THE SEROUS COMPONENT.  DIAGNOSIS  1. UTERUS, CERVIX WITH BILATERAL FALLOPIAN TUBES AND OVARIES,  HYSTERECTOMY AND  BILATERAL SALPINGO-OOPHORECTOMY:  HIGH GRADE/POORLY DIFFERENTIATED  ENDOMETRIAL ADENOCARCINOMA WITH SOLID AND SEROUS PAPILLARY COMPONENTS.  HISTOPATHOLOGIC TYPE:A VARIETY OF PATTERNS WERE PRESENT, ALL OF WHICH SHOULD BE CONSIDERED TO BE HIGH GRADE.SEROUS PAPILLARY CARCINOMA WAS SEEN OCCURRING ADJACENT TO SOLID ENDOMETRIAL CARCINOMA WITH MARKED ANAPLASIA AND GIANT CELLS. SIZE:TUMOR MEASURED AT LEAST 4.8 CM IN GREATEST HORIZONTAL DIMENSION.  GRADE: POORLY DIFFERENTIATED OR HIGH GRADE. DEPTH OF INVASION: NO DEFINITE MYOMETRIAL INVOLVEMENT WAS SEEN.THE TUMOR APPEARED CONFINED TO THE POLYP AS WELL AS SURFACE ENDOMETRIUM. WHERE MYOMETRIAL THICKNESS NOT APPLICABLE. SEROSAL INVOLVEMENT: NOT DEMONSTRATED.  ENDOCERVICAL INVOLVEMENT:NOT DEMONSTRATED. RESECTION MARGINS: FREE OF INVOLVEMENT. EXTRAUTERINE EXTENSION:NOT DEMONSTRATED. ANGIOLYMPHATIC INVASION: NOT DEFINITELY SEEN. TOTAL NODES EXAMINED:22.  PELVIC NODES EXAMINED:20.  PELVIC NODES INVOLVED:0.  PARA-AORTIC NODES 2. EXAMINED:  PARA-AORTIC NODES 0. INVOLVED TNM STAGE: T1A N0 MX. AJCC STAGE GROUPING: IA. FIGO STAGE:IA.   08/26/2012 Imaging   No CT evidence for intra-abdominal or pelvic metastatic disease. Trace free pelvic fluid, presumably postoperative although the date of surgery is not documented in the electronic medical record.   08/26/2012 - 12/13/2012 Chemotherapy   She received 6 cycles of carbo/taxol    10/29/2012 - 11/27/2012 Radiation Therapy   May 27, June 5,  June 11, June 19, November 27, 2012: Proximal vagina 30 Gy in 5 fractions     01/17/2013 Imaging   1.  No evidence of recurrent or metastatic disease. 2.  No acute abnormality involving the abdomen or pelvis. 3.  Mild diffuse hepatic steatosis. 4.  Very small supraumbilical midline anterior abdominal wall hernia containing fat, unchanged   01/22/2014 Imaging   No evidence for  metastatic or recurrent disease. 2. No bowel obstruction.  Normal appendix. 3. Small fat containing hernia, stable in appearance. 4. Status post hysterectomy and bilateral oophorectomy   02/19/2014 Imaging   No pulmonary lesions are identified. The abnormality on the chest x-ray is due to asymmetric left-sided sternoclavicular joint degenerative disease   10/12/2014 Imaging   New mild retroperitoneal lymphadenopathy in the left paraaortic region and proximal left common iliac chain, consistent with metastatic disease. No other sites of metastatic disease identified within the abdomen or pelvis.     11/27/2014 - 02/11/2015 Chemotherapy   She received 4 cycles of carbo/taxol    03/01/2015 PET scan   Single hypermetabolic small retroperitoneal lymph node along the aorta. 2. No evidence of metastatic disease otherwise in the abdomen or pelvis. No evidence local recurrence. 3. Intensely hypermetabolic enlarged nodule adjacent to the RIGHT lobe of thyroid gland. This presumably represents the biopsied lesion in clinician report which was found to be benign thyroid tissue.   04/05/2015 - 05/13/2015 Radiation Therapy   She received 50.4 gray in 28 fractions with simultaneous integrated boost to 56 gray   06/17/2015 Imaging   No acute process or evidence of metastatic disease in the abdomen or pelvis. Resolution of previously described retroperitoneal adenopathy. 2.  Possible constipation. 3. Atherosclerosis.   06/17/2015 Tumor Marker   Patient's tumor was tested for the following markers: CA125 Results of the tumor marker test revealed 33   08/12/2015 Tumor Marker   Patient's tumor was tested for the following markers: CA125 Results of the tumor marker test revealed 52   08/31/2015 PET scan   Development of right paratracheal hypermetabolic adenopathy, consistent with nodal metastasis. 2. The previously described isolated abdominal retroperitoneal hypermetabolic node has resolved. 3. Persistent  hypermetabolic right thyroid nodule, per report previously biopsied. Correlate with those results. 4.  Possible constipation.   09/09/2015 -  Anti-estrogen oral therapy   She has been  receiving alternative treatment between megace and tamoxifen   09/09/2015 Tumor Marker   Patient's tumor was tested for the following markers: CA125 Results of the tumor marker test revealed 37.8   09/20/2015 Procedure   Technically successful ultrasound-guided thyroid aspiration biopsy , dominant right nodule   09/20/2015 Pathology Results   THYROID, RIGHT, FINE NEEDLE ASPIRATION (SPECIMEN 1 OF 1 COLLECTED 09/20/15): FINDINGS CONSISTENT WITH BENIGN THYROID NODULE (BETHESDA CATEGORY II).   10/28/2015 Tumor Marker   Patient's tumor was tested for the following markers: CA125 Results of the tumor marker test revealed 53.2   11/04/2015 Imaging   Stable benign right thyroid nodule   12/16/2015 Tumor Marker   Patient's tumor was tested for the following markers: CA125 Results of the tumor marker test revealed 38.1   03/22/2016 PET scan   Interval progression of hypermetabolic right paratracheal lymph node consistent with metastatic involvement. 2. Stable hypermetabolic right thyroid nodule. Reportedly this has been biopsied in the past.   03/23/2016 Tumor Marker   Patient's tumor was tested for the following markers: CA125 Results of the tumor marker test revealed 62.4   04/13/2016 - 06/01/2016 Radiation Therapy   She received 56 Gy to the chest in 28 fractions    05/09/2016 Imaging   No evidence of left lower extremity deep vein thrombosis. No evidence of a superficial thrombosis of the greater and lesser saphenous veins. Positive for thrombus noted in several varicosities of the calf. No evidence of Baker's cyst on the left.   05/17/2016 Tumor Marker   Patient's tumor was tested for the following markers: CA125 Results of the tumor marker test revealed 9   06/29/2016 Tumor Marker   Patient's tumor was  tested for the following markers: CA125 Results of the tumor marker test revealed 5.9   08/04/2016 Tumor Marker   Patient's tumor was tested for the following markers: CA125 Results of the tumor marker test revealed 6.0   09/12/2016 PET scan   Complete metabolic response to therapy, with resolution of hypermetabolic mediastinal lymphadenopathy since prior exam. No residual or new metastatic disease identified. Stable hypermetabolic right thyroid lobe nodule, which was previously biopsied on 09/20/2015.   03/13/2017 PET scan   1. Solitary focus of recurrent right paratracheal hypermetabolic activity, with a 1.0 cm right lower paratracheal node having a maximum SUV of 9.0. Appearance compatible with recurrent malignancy. 2. Continued hypermetabolic right thyroid nodule, previously biopsied, presumed benign -correlate with prior biopsy results. 3. Other imaging findings of potential clinical significance: Aortic Atherosclerosis (ICD10-I70.0). Mild cardiomegaly. Prominent stool throughout the colon favors constipation.   04/02/2017 Pathology Results   FINE NEEDLE ASPIRATION, ENDOSCOPIC, (EBUS) 4 R NODE (SPECIMEN 1 OF 2 COLLECTED 04/02/17): MALIGNANT CELLS PRESENT, CONSISTENT WITH CARCINOMA. SEE COMMENT. COMMENT: THE MALIGNANT CELLS ARE POSITIVE FOR P53 AND NEGATIVE FOR ESTROGEN RECEPTOR AND TTF-1. THIS PROFILE IS NON-SPECIFIC, BUT THE P53 POSITIVE STAINING IS SUGGESTIVE OF GYNECOLOGIC PRIMARY.    04/11/2017 Procedure   Successful 8 French right internal jugular vein power port placement with its tip at the SVC/RA junction   04/12/2017 Imaging   Normal LV size with mild LV hypertrophy. EF 55-60%. Normal RV size and systolic function. Aortic valve sclerosis without significant stenosis.   04/18/2017 - 06/14/2017 Chemotherapy   The patient had chemotherapy with Doxil    07/10/2017 Imaging   - Left ventricle: The cavity size was normal. There was mild concentric hypertrophy. Systolic function was  normal. The estimated ejection fraction was in the range of 55% to 60%. Wall  motion was normal; there were no regional wall motion abnormalities. Doppler parameters are consistent with abnormal left ventricular relaxation (grade 1 diastolic dysfunction). - Aortic valve: Noncoronary cusp mobility was mildly restricted. - Mitral valve: There was mild regurgitation. - Left atrium: The atrium was mildly dilated. - Atrial septum: No defect or patent foramen ovale was identified.  Impressions:  - Compared to November 2018, global LV longitudinal strain remains normal (has increased)   07/11/2017 PET scan   Increased size and hypermetabolic activity of 3.3 cm right thyroid lobe nodule. Thyroid carcinoma cannot be excluded. Recommend repeat ultrasound guided fine needle aspiration to exclude thyroid carcinoma.  New adjacent hypermetabolic 10 mm right supraclavicular lymph node, suspicious for lymph node metastasis.  Slight increase in size and hypermetabolic activity of solitary right paratracheal lymph node.  No evidence of abdominal or pelvic metastatic disease.   07/18/2017 Procedure   1. Technically successful ultrasound guided fine needle aspiration of indeterminate hypermetabolic right-sided thyroid nodule/mass. 2. Technically successful ultrasound-guided core needle biopsy of hypermetabolic right lower cervical lymph node.   07/18/2017 Pathology Results   THYROID, FINE NEEDLE ASPIRATION RIGHT (SPECIMEN 1 OF 1, COLLECTED ON 07/18/2017): ATYPIA OF UNDETERMINED SIGNIFICANCE OR FOLLICULAR LESION OF UNDETERMINED SIGNIFICANCE (BETHESDA CATEGORY III). SEE COMMENT. COMMENT: THE SPECIMEN CONSISTS OF SMALL AND MEDIUM SIZED GROUPS OF FOLLICULAR EPITHELIAL CELLS WITH MILD CYTOLOGIC ATYPIA INCLUDING NUCLEAR ENLARGEMENT AND HURTHLE CELL CHANGE. SOME GROUPS ARE ARRANGED AS MICROFOLLICLES. THERE IS MINIMAL BACKGROUND COLLOID. BASED ON THESE FEATURES, A FOLLICULAR LESION/NEOPLASM CAN NOT BE ENTIRELY RULED OUT. A  SPECIMEN WILL BE SENT FOR AFIRMA TESTING.   07/19/2017 Pathology Results   Lymph node, needle/core biopsy - METASTATIC PAPILLARY SEROUS CARCINOMA. - SEE COMMENT. Microscopic Comment Dr. Vicente Males has reviewed the case and concurs with this interpretation   10/15/2017 PET scan   No new or progressive disease. No evidence of abdominal or pelvic metastatic disease.  Stable hypermetabolic right thyroid lobe nodule and adjacent right supraclavicular lymph node.  Decreased size and hypermetabolic activity of solitary right paratracheal lymph node.   01/23/2018 PET scan   1. Overall, no significant change from PET-CT of 3 months ago. There is persistent hypermetabolic activity at the right thoracic inlet and within a right paratracheal mediastinal node. The nodes have not significantly changed in size, although the metabolic activity has minimally increased over this interval. It is uncertain how much of the thoracic inlet metabolic activity is attributed to the thyroid nodule versus adjacent cervical lymph nodes, both previously biopsied. 2. No new hypermetabolic activity within the neck, chest, abdomen or pelvis. No evidence of local recurrence in the pelvis. 3. Stable probable radiation changes in the right lung.   04/16/2018 PET scan   1. Interval increase in metabolic activity of the RIGHT supraclavicular node and RIGHT lower paratracheal mediastinal node with minimal change in size. Findings consistent with persistent and mildly progressed metastatic adenopathy. 2. New hypermetabolic small lymph node in the RIGHT lower paratracheal nodal station adjacent to the previously followed node. 3. No evidence of local recurrence in the pelvis or new disease elsewhere.   07/30/2018 PET scan   1. Mild interval progression of right supraclavicular, mediastinal, and right hilar hypermetabolic metastatic disease. There is a new hypermetabolic lymph node in the subcarinal station on today's study. 2. No  hypermetabolic disease in the abdomen or pelvis to suggest recurrent/metastases. 3.  Aortic Atherosclerois (ICD10-170.0)    08/16/2018 -  Chemotherapy   The patient had Lenvima since 3/6 and pembrolizumab since 3/13  for chemotherapy treatment. Michel Santee was held temporarily due to infection and severe hypertension   10/31/2018 PET scan   Partial response to therapy, with decreased hypermetabolic lymphadenopathy in mediastinum and right hilar region.  No significant change in hypermetabolic lymphadenopathy in right inferior neck.  No new or progressive metastatic disease identified. No evidence of recurrent or metastatic carcinoma in the pelvis or abdomen   02/04/2019 PET scan   1. Decrease in metabolic activity of RIGHT supraclavicular node and RIGHT paratracheal lymph nodes. 2. Persistent activity in the RIGHT thyroid nodule unchanged. 3. Diffuse activity through the esophagus is favored esophagitis. No change. 4. Scattered foci of intense metabolic activity associated the bowel without focal lesion on CT favored benign. 5. No evidence of peritoneal metastasis. 6. No evidence of metastatic adenopathy in the abdomen pelvis. 7. No evidence of local pelvic sidewall recurrence.   07/30/2019 PET scan   1. Mildly increased uptake within small nodes along the right sternocleidomastoid without change in size and associated with increased deltoid activity may reflect changes related to right arm injection or recent COVID vaccination, consider clinical correlation and attention on follow-up. 2. Persistent activity in the right thyroid nodule, unchanged from previous exams. 3. New activity in a juxta esophageal lymph node in the chest is suspicious though the node is unchanged with respect to size. Endoscopic correlation could potentially be helpful or close attention on follow-up. 4. Subtle increased FDG uptake along the left mainstem bronchus and adjacent to the aorta may represent a small lymph node.  No discrete correlate is demonstrated on today's study.   10/22/2019 PET scan   1. Persistent FDG avid subcentimeter right supraclavicular and juxta esophageal lymph nodes. The degree of FDG uptake is similar to the previous exam. No new or progressive findings identified. 2. No findings of solid organ metastasis, skeletal metastasis or metastatic disease to the abdomen or pelvis. 3. FDG avid right lobe of thyroid gland nodule is again noted This nodule is not incidental and been worked up previously with 2 biopsies. Please refer to results from the most recent biopsy dated 07/18/2017. (Ref: J Am Coll Radiol. 2015 Feb;12(2): 143-50). 4.  Aortic Atherosclerosis (ICD10-I70.0).   05/05/2020 PET scan   1. Progressive disease, as evidenced by increased hypermetabolism within right supraclavicular and mediastinal nodes. 2. No infradiaphragmatic hypermetabolic metastasis. 3.  Aortic Atherosclerosis (ICD10-I70.0). 4. 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)  07/11/2017 Initial Diagnosis   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[place])   08/16/2018 -  Chemotherapy   The patient had pembrolizumab (KEYTRUDA) 200 mg in sodium chloride 0.9 % 50 mL chemo infusion, 200 mg, Intravenous, Once, 27 of 28 cycles Administration: 200 mg (08/16/2018), 200 mg (09/06/2018), 200 mg (09/30/2018), 200 mg (10/21/2018), 200 mg (11/11/2018), 200 mg (12/02/2018), 200 mg (12/23/2018), 200 mg (01/13/2019), 200 mg (02/05/2019), 200 mg (02/27/2019), 200 mg (03/20/2019), 200 mg (04/10/2019), 200 mg (05/08/2019), 200 mg (05/29/2019), 200 mg (06/19/2019), 200 mg (07/10/2019), 200 mg (07/31/2019), 200 mg (08/21/2019), 200 mg (09/11/2019), 200 mg (10/02/2019), 200 mg (10/23/2019), 200 mg (11/13/2019), 200 mg (12/04/2019), 200 mg (12/25/2019), 200 mg (01/15/2020), 200 mg (02/05/2020), 200 mg (02/26/2020)  for chemotherapy treatment.      REVIEW OF SYSTEMS:    Constitutional: Denies fevers, chills or abnormal weight loss Eyes: Denies blurriness of vision Ears, nose, mouth, throat, and face: Denies mucositis or sore throat Respiratory: Denies cough, dyspnea or wheezes Cardiovascular: Denies  palpitation, chest discomfort or lower extremity swelling Gastrointestinal:  Denies nausea, heartburn or change in bowel habits Skin: Denies abnormal skin rashes Lymphatics: Denies new lymphadenopathy or easy bruising Neurological:Denies numbness, tingling or new weaknesses Behavioral/Psych: Mood is stable, no new changes  All other systems were reviewed with the patient and are negative.  I have reviewed the past medical history, past surgical history, social history and family history with the patient and they are unchanged from previous note.  ALLERGIES:  is allergic to other.  MEDICATIONS:  Current Outpatient Medications  Medication Sig Dispense Refill  . amLODipine (NORVASC) 10 MG tablet TAKE 1 TABLET BY MOUTH ONCE DAILY. **DECREASE SIMVASTATIN TO 20 MG DAILY WHILE ON AMLODIPINE PER MD** 90 tablet 1  . aspirin 81 MG EC tablet Take by mouth. (Patient not taking: Reported on 05/12/2020)    . busPIRone (BUSPAR) 10 MG tablet Take 10 mg by mouth 2 (two) times daily.    . Calcium Carb-Cholecalciferol 500-600 MG-UNIT TABS Take 1 tablet by mouth daily.     . Cholecalciferol (VITAMIN D3) 2000 units TABS Take 2,000 Units by mouth daily.     . hydrochlorothiazide (MICROZIDE) 12.5 MG capsule Take 12.5 mg by mouth daily.    Marland Kitchen lenvatinib 10 mg daily dose (LENVIMA, 10 MG DAILY DOSE,) capsule Take 1 capsule (10 mg total) by mouth daily. 30 capsule 11  . levothyroxine (SYNTHROID) 75 MCG tablet TAKE 1 TABLET (75 MCG TOTAL) BY MOUTH DAILY BEFORE BREAKFAST. 90 tablet 2  . lidocaine-prilocaine (EMLA) cream APPLY TO AFFECTED AREA ONCE AS DIRECTED 30 g 3  . LORazepam (ATIVAN) 0.5 MG tablet Take 0.5 mg by mouth 2 (two) times daily as needed. (Patient not taking: Reported on  05/12/2020)  0  . losartan (COZAAR) 25 MG tablet Take 1 tablet (25 mg total) by mouth daily. 90 tablet 3  . metoprolol tartrate (LOPRESSOR) 25 MG tablet TAKE 1 TABLET (25 MG TOTAL) BY MOUTH 2 (TWO) TIMES DAILY. 180 tablet 3  . ondansetron (ZOFRAN) 8 MG tablet Take 1 tablet (8 mg total) by mouth every 8 (eight) hours as needed for nausea. (Patient not taking: Reported on 05/12/2020) 30 tablet 3  . pantoprazole (PROTONIX) 40 MG tablet Take 1 tablet (40 mg total) by mouth daily. 90 tablet 3  . simvastatin (ZOCOR) 20 MG tablet SMARTSIG:1 Tablet(s) By Mouth Every Evening    . tacrolimus (PROTOPIC) 0.1 % ointment Apply 1 application topically daily as needed (rash).     . venlafaxine XR (EFFEXOR-XR) 150 MG 24 hr capsule Take 300 mg by mouth every morning.     No current facility-administered medications for this visit.   Facility-Administered Medications Ordered in Other Visits  Medication Dose Route Frequency Provider Last Rate Last Admin  . sodium chloride flush (NS) 0.9 % injection 10 mL  10 mL Intracatheter PRN Alvy Bimler, Ni, MD   10 mL at 07/29/20 1325    PHYSICAL EXAMINATION: ECOG PERFORMANCE STATUS: 1 - Symptomatic but completely ambulatory  Vitals:   07/29/20 1040  BP: 129/63  Pulse: 68  Resp: 20  Temp: (!) 97 F (36.1 C)  SpO2: 99%   Filed Weights   07/29/20 1040  Weight: 156 lb 9.6 oz (71 kg)    GENERAL:alert, no distress and comfortable Musculoskeletal:no cyanosis of digits and no clubbing  NEURO: alert & oriented x 3 with fluent speech, no focal motor/sensory deficits  LABORATORY DATA:  I have reviewed the data as listed    Component Value Date/Time   NA  137 07/29/2020 1035   NA 141 05/17/2017 0957   K 3.6 07/29/2020 1035   K 3.6 05/17/2017 0957   CL 102 07/29/2020 1035   CL 105 11/19/2012 0848   CO2 27 07/29/2020 1035   CO2 25 05/17/2017 0957   GLUCOSE 93 07/29/2020 1035   GLUCOSE 85 05/17/2017 0957   GLUCOSE 122 (H) 11/19/2012 0848   BUN 25 (H) 07/29/2020 1035    BUN 15.2 05/17/2017 0957   CREATININE 0.86 07/29/2020 1035   CREATININE 0.83 07/10/2019 1240   CREATININE 0.8 05/17/2017 0957   CALCIUM 9.3 07/29/2020 1035   CALCIUM 9.6 05/17/2017 0957   PROT 7.0 07/29/2020 1035   PROT 7.0 05/17/2017 0957   ALBUMIN 3.8 07/29/2020 1035   ALBUMIN 3.9 05/17/2017 0957   AST 21 07/29/2020 1035   AST 23 07/10/2019 1240   AST 25 05/17/2017 0957   ALT 16 07/29/2020 1035   ALT 20 07/10/2019 1240   ALT 18 05/17/2017 0957   ALKPHOS 49 07/29/2020 1035   ALKPHOS 57 05/17/2017 0957   BILITOT 0.5 07/29/2020 1035   BILITOT 0.4 07/10/2019 1240   BILITOT 0.47 05/17/2017 0957   GFRNONAA >60 07/29/2020 1035   GFRNONAA >60 07/10/2019 1240   GFRAA >60 02/26/2020 0940   GFRAA >60 07/10/2019 1240    No results found for: SPEP, UPEP  Lab Results  Component Value Date   WBC 3.3 (L) 07/29/2020   NEUTROABS 2.2 07/29/2020   HGB 10.2 (L) 07/29/2020   HCT 31.2 (L) 07/29/2020   MCV 96.0 07/29/2020   PLT 179 07/29/2020      Chemistry      Component Value Date/Time   NA 137 07/29/2020 1035   NA 141 05/17/2017 0957   K 3.6 07/29/2020 1035   K 3.6 05/17/2017 0957   CL 102 07/29/2020 1035   CL 105 11/19/2012 0848   CO2 27 07/29/2020 1035   CO2 25 05/17/2017 0957   BUN 25 (H) 07/29/2020 1035   BUN 15.2 05/17/2017 0957   CREATININE 0.86 07/29/2020 1035   CREATININE 0.83 07/10/2019 1240   CREATININE 0.8 05/17/2017 0957   GLU 158 (H) 01/14/2015 1035      Component Value Date/Time   CALCIUM 9.3 07/29/2020 1035   CALCIUM 9.6 05/17/2017 0957   ALKPHOS 49 07/29/2020 1035   ALKPHOS 57 05/17/2017 0957   AST 21 07/29/2020 1035   AST 23 07/10/2019 1240   AST 25 05/17/2017 0957   ALT 16 07/29/2020 1035   ALT 20 07/10/2019 1240   ALT 18 05/17/2017 0957   BILITOT 0.5 07/29/2020 1035   BILITOT 0.4 07/10/2019 1240   BILITOT 0.47 05/17/2017 0957

## 2020-07-29 NOTE — Patient Instructions (Signed)
[facility] Discharge Instructions for Patients Receiving Chemotherapy  Today you received the following chemotherapy agents: pembrolizumab.  To help prevent nausea and vomiting after your treatment, we encourage you to take your nausea medication as directed.   If you develop nausea and vomiting that is not controlled by your nausea medication, call the clinic.   BELOW ARE SYMPTOMS THAT SHOULD BE REPORTED IMMEDIATELY:  *FEVER GREATER THAN 100.5 F  *CHILLS WITH OR WITHOUT FEVER  NAUSEA AND VOMITING THAT IS NOT CONTROLLED WITH YOUR NAUSEA MEDICATION  *UNUSUAL SHORTNESS OF BREATH  *UNUSUAL BRUISING OR BLEEDING  TENDERNESS IN MOUTH AND THROAT WITH OR WITHOUT PRESENCE OF ULCERS  *URINARY PROBLEMS  *BOWEL PROBLEMS  UNUSUAL RASH Items with * indicate a potential emergency and should be followed up as soon as possible.  Feel free to call the clinic should you have any questions or concerns. The clinic phone number is [phone].  Please show the CHEMO ALERT CARD at check-in to the Emergency Department and triage nurse.   

## 2020-08-02 ENCOUNTER — Other Ambulatory Visit: Payer: Self-pay

## 2020-08-02 ENCOUNTER — Ambulatory Visit
Admission: RE | Admit: 2020-08-02 | Discharge: 2020-08-02 | Disposition: A | Discharge status: Home / Self Care | Payer: 59 | Source: Ambulatory Visit | Attending: Radiation Oncology | Admitting: Radiation Oncology

## 2020-08-02 ENCOUNTER — Encounter: Payer: Self-pay | Admitting: Radiation Oncology

## 2020-08-02 DIAGNOSIS — Z79899 Other long term (current) drug therapy: Secondary | ICD-10-CM | POA: Diagnosis not present

## 2020-08-02 DIAGNOSIS — C778 Secondary and unspecified malignant neoplasm of lymph nodes of multiple regions: Secondary | ICD-10-CM | POA: Insufficient documentation

## 2020-08-02 DIAGNOSIS — Z7982 Long term (current) use of aspirin: Secondary | ICD-10-CM | POA: Insufficient documentation

## 2020-08-02 DIAGNOSIS — C770 Secondary and unspecified malignant neoplasm of lymph nodes of head, face and neck: Secondary | ICD-10-CM

## 2020-08-02 DIAGNOSIS — C541 Malignant neoplasm of endometrium: Secondary | ICD-10-CM | POA: Insufficient documentation

## 2020-08-02 DIAGNOSIS — Z923 Personal history of irradiation: Secondary | ICD-10-CM | POA: Diagnosis not present

## 2020-08-02 NOTE — Progress Notes (Signed)
Radiation Oncology         [phone] ________________________________  Name: Diane Lozano MRN: [id number]  Date: 08/02/2020  DOB: 1955/12/05  Follow-Up Visit Note  CC: [name], MD  [name], MD    ICD-10-CM   1. 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 She was last seen by Dr. Alvy Bimler on 07/29/2020 and continues treatment with Pembrolizumab and Lenvima.  On review of systems, she reports tolerating her radiation therapy well without any lasting side effects. She denies itching or discomfort in the right supraclavicular region or pain.                ALLERGIES:  has no active allergies.  Meds: Current Outpatient Medications  Medication Sig Dispense Refill  . amLODipine (NORVASC) 10 MG tablet TAKE 1 TABLET BY MOUTH ONCE DAILY. **DECREASE SIMVASTATIN TO 20 MG DAILY WHILE ON AMLODIPINE PER MD** 90 tablet 1  . busPIRone (BUSPAR) 10 MG tablet Take 10 mg by mouth 2 (two) times daily.    . Calcium Carb-Cholecalciferol 500-600 MG-UNIT TABS Take 1 tablet by mouth daily.     . Cholecalciferol (VITAMIN D3) 2000 units TABS Take 2,000 Units by mouth daily.     . hydrochlorothiazide (MICROZIDE) 12.5 MG capsule Take 12.5 mg by mouth daily.    Marland Kitchen lenvatinib 10 mg daily dose (LENVIMA, 10 MG DAILY DOSE,) capsule Take 1 capsule (10 mg total) by mouth daily. 30 capsule 11  . levothyroxine (SYNTHROID) 75 MCG tablet TAKE 1 TABLET (75 MCG TOTAL) BY  MOUTH DAILY BEFORE BREAKFAST. 90 tablet 2  . lidocaine-prilocaine (EMLA) cream APPLY TO AFFECTED AREA ONCE AS DIRECTED 30 g 3  . LORazepam (ATIVAN) 0.5 MG tablet Take 0.5 mg by mouth 2 (two) times daily as needed.  0  . losartan (COZAAR) 25 MG tablet Take 1 tablet (25 mg total) by mouth daily. 90 tablet 3  . metoprolol tartrate (LOPRESSOR) 25 MG tablet TAKE 1 TABLET (25 MG TOTAL) BY MOUTH 2 (TWO) TIMES DAILY. 180 tablet 3  . ondansetron (ZOFRAN) 8 MG tablet Take 1 tablet (8 mg total) by mouth every 8 (eight) hours as needed for nausea. 30 tablet 3  . simvastatin (ZOCOR) 20 MG tablet SMARTSIG:1 Tablet(s) By Mouth Every Evening    . tacrolimus (PROTOPIC) 0.1 % ointment Apply 1 application topically daily as needed (rash).     . venlafaxine XR (EFFEXOR-XR) 150 MG 24 hr capsule Take 300 mg by mouth every morning.    Marland Kitchen aspirin 81 MG EC tablet Take by mouth. (Patient not taking: No sig reported)    . pantoprazole (PROTONIX) 40 MG tablet TAKE 1 TABLET BY MOUTH ONCE DAILY 90 tablet 3   No current facility-administered medications for this encounter.    Physical Findings: The patient is in no acute distress. Patient is alert and oriented.  height is 5\' 4"  (1.626 m) and weight is 157 lb 9.6 oz (71.5 kg). Her temperature is 98.2 F (36.8 C). Her blood pressure is 126/74 and her pulse is  83. Her respiration is 18 and oxygen saturation is 100%.  Lungs are clear to auscultation bilaterally. Heart has regular rate and rhythm. No palpable cervical, supraclavicular, or axillary adenopathy. Abdomen soft, non-tender, normal bowel sounds.  Careful examination of the right supraclavicular region reveals no palpable adenopathy.  No significant residual skin changes noted.  Lab Findings: Lab Results  Component Value Date   WBC 3.3 (L) 07/29/2020   HGB 10.2 (L) 07/29/2020   HCT 31.2 (L) 07/29/2020   MCV 96.0 07/29/2020   PLT 179 07/29/2020    Radiographic Findings: No results found.  Impression: Recurrent  endometrial cancer (high-grade serous adenocarcinoma) with metastasis to the retroperitoneal lymph nodes and isolated intrathoracic nodal recurrence, now with progressive disease within the right supraclavicular   The patient tolerated her radiation therapy quite well.  I am unable to palpate any lymphadenopathy in the right supraclavicular area at this time.  Patient is under a lot of stress at this time as her husband was recently placed in hospice.  Plan: The patient is scheduled to follow up with Dr. Alvy Bimler on 08/19/2020. She will follow up with radiation oncology in as needed basis in light of her close follow-up with medical oncology    ____________________________________   Blair Promise, PhD, MD  This document serves as a record of services personally performed by Gery Pray, MD. It was created on his behalf by Clerance Lav, a trained medical scribe. The creation of this record is based on the scribe's personal observations and the provider's statements to them. This document has been checked and approved by the attending provider.

## 2020-08-02 NOTE — Progress Notes (Incomplete)
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 She did report some ongoing fatigue and nausea. She denied vomiting, diarrhea, constipation, changes in breathing, difficulty swallowing, and issues with range of motion or pain/discomfort of right upper extremity. Examination of the right supraclavicular region revealed minimal erythema and mild hyperpigmentation changes. No skin breakdown.  Plan: The patient will follow-up with radiation oncology in one month.  ________________________________________________   Blair Promise, PhD, MD  This document serves as a record of services personally performed by Gery Pray, MD. It was created on his behalf by Clerance Lav, a trained medical scribe. The creation of this record is based on the scribe's personal observations and the provider's statements to them. This document has been checked and approved by the attending provider.

## 2020-08-02 NOTE — Progress Notes (Signed)
Patient is here today for 1 month follow up to radiation to right supracavicular area.  Patient denies having any pain today.  Patient reports that the peeling to that area has improved.  She states she hasn't inspected the area to see if the skin color has improved.  Denied having any swelling to that area.  Denies having any swallow changes while in therapy.  Vitals:   08/02/20 1527  BP: 126/74  Pulse: 83  Resp: 18  Temp: 98.2 F (36.8 C)  SpO2: 100%  Weight: 157 lb 9.6 oz (71.5 kg)  Height: 5\' 4"  (1.626 m)

## 2020-08-03 ENCOUNTER — Other Ambulatory Visit: Payer: Self-pay | Admitting: Hematology and Oncology

## 2020-08-03 MED FILL — PANTOPRAZOLE SOD DR 40 MG T: 40 | 90 days supply | Qty: 90 | Fill #0

## 2020-08-19 ENCOUNTER — Other Ambulatory Visit: Payer: Self-pay

## 2020-08-19 ENCOUNTER — Encounter: Payer: Self-pay | Admitting: Hematology and Oncology

## 2020-08-19 ENCOUNTER — Inpatient Hospital Stay: Payer: 59

## 2020-08-19 ENCOUNTER — Inpatient Hospital Stay (HOSPITAL_BASED_OUTPATIENT_CLINIC_OR_DEPARTMENT_OTHER): Payer: 59 | Admitting: Hematology and Oncology

## 2020-08-19 ENCOUNTER — Inpatient Hospital Stay: Payer: 59 | Attending: Hematology and Oncology

## 2020-08-19 ENCOUNTER — Encounter: Payer: Self-pay | Admitting: Licensed Clinical Social Worker

## 2020-08-19 VITALS — BP 118/51 | HR 65 | Temp 97.8°F | Resp 18 | Ht 64.0 in | Wt 154.6 lb

## 2020-08-19 DIAGNOSIS — Z79899 Other long term (current) drug therapy: Secondary | ICD-10-CM | POA: Diagnosis not present

## 2020-08-19 DIAGNOSIS — I10 Essential (primary) hypertension: Secondary | ICD-10-CM

## 2020-08-19 DIAGNOSIS — Z9221 Personal history of antineoplastic chemotherapy: Secondary | ICD-10-CM | POA: Insufficient documentation

## 2020-08-19 DIAGNOSIS — F4321 Adjustment disorder with depressed mood: Secondary | ICD-10-CM

## 2020-08-19 DIAGNOSIS — C541 Malignant neoplasm of endometrium: Secondary | ICD-10-CM | POA: Insufficient documentation

## 2020-08-19 DIAGNOSIS — Z7189 Other specified counseling: Secondary | ICD-10-CM

## 2020-08-19 DIAGNOSIS — E039 Hypothyroidism, unspecified: Secondary | ICD-10-CM | POA: Insufficient documentation

## 2020-08-19 DIAGNOSIS — D61818 Other pancytopenia: Secondary | ICD-10-CM | POA: Diagnosis not present

## 2020-08-19 DIAGNOSIS — Z7982 Long term (current) use of aspirin: Secondary | ICD-10-CM | POA: Insufficient documentation

## 2020-08-19 DIAGNOSIS — Z923 Personal history of irradiation: Secondary | ICD-10-CM | POA: Insufficient documentation

## 2020-08-19 DIAGNOSIS — Z5112 Encounter for antineoplastic immunotherapy: Secondary | ICD-10-CM | POA: Diagnosis not present

## 2020-08-19 DIAGNOSIS — Z95828 Presence of other vascular implants and grafts: Secondary | ICD-10-CM

## 2020-08-19 DIAGNOSIS — C770 Secondary and unspecified malignant neoplasm of lymph nodes of head, face and neck: Secondary | ICD-10-CM | POA: Diagnosis not present

## 2020-08-19 LAB — CBC WITH DIFFERENTIAL/PLATELET
Abs Immature Granulocytes: 0.01 10*3/uL (ref 0.00–0.07)
Basophils Absolute: 0 10*3/uL (ref 0.0–0.1)
Basophils Relative: 1 %
Eosinophils Absolute: 0.2 10*3/uL (ref 0.0–0.5)
Eosinophils Relative: 6 %
HCT: 33.3 % — ABNORMAL LOW (ref 36.0–46.0)
Hemoglobin: 10.9 g/dL — ABNORMAL LOW (ref 12.0–15.0)
Immature Granulocytes: 0 %
Lymphocytes Relative: 17 %
Lymphs Abs: 0.6 10*3/uL — ABNORMAL LOW (ref 0.7–4.0)
MCH: 31.3 pg (ref 26.0–34.0)
MCHC: 32.7 g/dL (ref 30.0–36.0)
MCV: 95.7 fL (ref 80.0–100.0)
Monocytes Absolute: 0.2 10*3/uL (ref 0.1–1.0)
Monocytes Relative: 7 %
Neutro Abs: 2.4 10*3/uL (ref 1.7–7.7)
Neutrophils Relative %: 69 %
Platelets: 176 10*3/uL (ref 150–400)
RBC: 3.48 MIL/uL — ABNORMAL LOW (ref 3.87–5.11)
RDW: 13.8 % (ref 11.5–15.5)
WBC: 3.5 10*3/uL — ABNORMAL LOW (ref 4.0–10.5)
nRBC: 0 % (ref 0.0–0.2)

## 2020-08-19 LAB — COMPREHENSIVE METABOLIC PANEL
ALT: 20 U/L (ref 0–44)
AST: 22 U/L (ref 15–41)
Albumin: 3.8 g/dL (ref 3.5–5.0)
Alkaline Phosphatase: 55 U/L (ref 38–126)
Anion gap: 8 (ref 5–15)
BUN: 22 mg/dL (ref 8–23)
CO2: 27 mmol/L (ref 22–32)
Calcium: 9.3 mg/dL (ref 8.9–10.3)
Chloride: 103 mmol/L (ref 98–111)
Creatinine, Ser: 0.83 mg/dL (ref 0.44–1.00)
GFR, Estimated: >60 mL/min (ref >60)
Glucose, Bld: 120 mg/dL — ABNORMAL HIGH (ref 70–99)
Potassium: 3.9 mmol/L (ref 3.5–5.1)
Sodium: 138 mmol/L (ref 135–145)
Total Bilirubin: 0.5 mg/dL (ref 0.3–1.2)
Total Protein: 7.2 g/dL (ref 6.5–8.1)

## 2020-08-19 LAB — TOTAL PROTEIN, URINE DIPSTICK: Protein, ur: NEGATIVE mg/dL

## 2020-08-19 LAB — TSH: TSH: 3.146 u[IU]/mL (ref 0.308–3.960)

## 2020-08-19 MED ORDER — SODIUM CHLORIDE 0.9 % IV SOLN
200.0000 mg | Freq: Once | INTRAVENOUS | Status: AC
  Administered 2020-08-19: 200 mg via INTRAVENOUS
  Filled 2020-08-19: qty 8

## 2020-08-19 MED ORDER — SODIUM CHLORIDE 0.9% FLUSH
10.0000 mL | Freq: Once | INTRAVENOUS | Status: AC
  Administered 2020-08-19: 10 mL
  Filled 2020-08-19: qty 10

## 2020-08-19 MED ORDER — SODIUM CHLORIDE 0.9 % IV SOLN
Freq: Once | INTRAVENOUS | Status: AC
  Filled 2020-08-19: qty 250

## 2020-08-19 MED ORDER — SODIUM CHLORIDE 0.9% FLUSH
10.0000 mL | INTRAVENOUS | Status: DC | PRN
Start: 2020-08-19 — End: 2020-08-19
  Administered 2020-08-19: 10 mL
  Filled 2020-08-19: qty 10

## 2020-08-19 MED ORDER — HEPARIN SOD (PORK) LOCK FLUSH 100 UNIT/ML IV SOLN
500.0000 [IU] | Freq: Once | INTRAVENOUS | Status: AC | PRN
  Administered 2020-08-19: 500 [IU]
  Filled 2020-08-19: qty 5

## 2020-08-19 NOTE — Patient Instructions (Signed)

## 2020-08-19 NOTE — Patient Instructions (Signed)
[facility] Discharge Instructions for Patients Receiving Chemotherapy  Today you received the following chemotherapy agents: Pembrolizumab   To help prevent nausea and vomiting after your treatment, we encourage you to take your nausea medication as directed.    If you develop nausea and vomiting that is not controlled by your nausea medication, call the clinic.   BELOW ARE SYMPTOMS THAT SHOULD BE REPORTED IMMEDIATELY:  *FEVER GREATER THAN 100.5 F  *CHILLS WITH OR WITHOUT FEVER  NAUSEA AND VOMITING THAT IS NOT CONTROLLED WITH YOUR NAUSEA MEDICATION  *UNUSUAL SHORTNESS OF BREATH  *UNUSUAL BRUISING OR BLEEDING  TENDERNESS IN MOUTH AND THROAT WITH OR WITHOUT PRESENCE OF ULCERS  *URINARY PROBLEMS  *BOWEL PROBLEMS  UNUSUAL RASH Items with * indicate a potential emergency and should be followed up as soon as possible.  Feel free to call the clinic should you have any questions or concerns. The clinic phone number is [phone].  Please show the CHEMO ALERT CARD at check-in to the Emergency Department and triage nurse.   

## 2020-08-19 NOTE — Progress Notes (Signed)
[place] OFFICE PROGRESS NOTE  Patient Care Team: [name], MD as PCP - General (Family Medicine) Reynold Bowen, MD as Consulting Physician (Endocrinology)  ASSESSMENT & PLAN:  Endometrial cancer [facility]) She is tolerating treatment well She just completed radiation therapy recently I plan to order PET CT scan before her next visit We will continue treatment with pembrolizumab and Lenvima  Acquired hypothyroidism She has intermittent elevated TSH I will adjust her thyroid medicine accordingly  Pancytopenia, acquired ([place]) This is due to side effects of treatment She is not symptomatic Observe only  Grief She has lost some weight due to grieving process She is widowed 2 days ago She is coping well with family support She would appreciate social worker to reach out to her next week for counseling, I have sent a consult   Orders Placed This Encounter  Procedures  . NM PET Image Restage (PS) Skull Base to Thigh    Standing Status:   Future    Standing Expiration Date:   08/19/2021    Order Specific Question:   If indicated for the ordered procedure, I authorize the administration of a radiopharmaceutical per Radiology protocol    Answer:   Yes    Order Specific Question:   Preferred imaging location?    Answer:   [facility]    Order Specific Question:   Radiology Contrast Protocol - do NOT remove file path    Answer:   [email or web address]  . Ambulatory referral to Social Work    Referral Priority:   Routine    Referral Type:   Consultation    Referral Reason:   Specialty Services Required    Number of Visits Requested:   1    All questions were answered. The patient knows to call the clinic with any problems, questions or concerns. The total time spent in the appointment was 30 minutes encounter with patients including review of chart and various tests results, discussions about plan of care and  coordination of care plan   [name], MD 08/19/2020 2:48 PM  INTERVAL HISTORY: Please see below for problem oriented charting. She returns for treatment and follow-up She shared with me that her husband recently passed away 2 days ago She has good family support but is grieving She had no new side effects from recent treatment No new lymphadenopathy  SUMMARY OF ONCOLOGIC HISTORY: Oncology History Overview Note  [employer] testing done 11-2015 on surgical path from 2014: MS stable TMB low 4 muts/mb ATM [id number] ERBB3 T389K E2H2 rearrangement exon 9 PPP2R1A P179R TP53 I195N    ER- APPROXIMATELY 25-35% STAINING IN NEOPLASTIC CELLS (INTERMEDIATE)  PR- APPROXIMATELY 25-35% STAINING IN NEOPLASTIC CELLS (STRONG)  Repeat biopsy 04/02/17: ER negative, Her 2 negative  Progressed on Doxil   Endometrial cancer ([place])  06/20/2012 Pathology Results   Biopsy positive for papillary serous carcinoma   06/20/2012 Genetic Testing   Foundation One testing done 11-2015 on surgical path from 2014: MS stable TMB low 4 muts/mb ATM [id number] ERBB3 T389K E2H2 rearrangement exon 9 PPP2R1A P179R TP53 I195N    06/20/2012 Initial Diagnosis   Patient presented to PCP with intermittent vaginal bleeding since ~ Oct 2013, endometrial biopsy 06-20-12 with complex endometrial hyperplasia with atypia   06/26/2012 Imaging   Thickened endometrial lining in a postmenopausal patient experiencing vaginal bleeding. In the setting of post-menopausal bleeding, endometrial sampling is indicated to exclude carcinoma. No focal myometrial abnormalities are seen.  Normal left ovary and non-visualized right ovary  07/30/2012 Surgery   Dr. Polly Cobia performed robotic hysterectomy with bilateral salpingo-oophorectomy, bilateral pelvic lymph node dissection and periaortic lymph node dissection. Intraoperatively on frozen section, the patient was noted to have a large endometrial polyp with  changes within the polyp consistent for high-grade malignancy, possibly papillary serous carcinoma. There is no obvious extrauterine disease noted.     07/30/2012 Pathology Results   [phone] SUPPLEMENTAL REPORT  THE ENDOMETRIAL CARCINOMA WAS ANALYZED FOR DNA MISMATCH REPAIR PROTEINS.IMMUNOHISTOCHEMICALLY, THE NEOPLASM RETAINED NUCLEAR EXPRESSION OF 4 GENE PRODUCTS, MLH1, MSH2, MSH6, AND PMS2, INVOLVED IN DNA MISMATCH REPAIR.POSITIVE AND NEGATIVE CONTROLS WORKED APPROPRIATELY.  PER REQUEST, AN ER AND PR ARE PERFORMED ON BLOCK 1I.  ER- APPROXIMATELY 25-35% STAINING IN NEOPLASTIC CELLS (INTERMEDIATE)  PR- APPROXIMATELY 25-35% STAINING IN NEOPLASTIC CELLS (STRONG)  ER AND PR PREDOMINANTLY SHOW STAINING IN THE SEROUS COMPONENT.  DIAGNOSIS  1. UTERUS, CERVIX WITH BILATERAL FALLOPIAN TUBES AND OVARIES,  HYSTERECTOMY AND BILATERAL SALPINGO-OOPHORECTOMY:  HIGH GRADE/POORLY DIFFERENTIATED ENDOMETRIAL ADENOCARCINOMA WITH SOLID AND SEROUS PAPILLARY COMPONENTS.  HISTOPATHOLOGIC TYPE:A VARIETY OF PATTERNS WERE PRESENT, ALL OF WHICH SHOULD BE CONSIDERED TO BE HIGH GRADE.SEROUS PAPILLARY CARCINOMA WAS SEEN OCCURRING ADJACENT TO SOLID ENDOMETRIAL CARCINOMA WITH MARKED ANAPLASIA AND GIANT CELLS. SIZE:TUMOR MEASURED AT LEAST 4.8 CM IN GREATEST HORIZONTAL DIMENSION.  GRADE: POORLY DIFFERENTIATED OR HIGH GRADE. DEPTH OF INVASION: NO DEFINITE MYOMETRIAL INVOLVEMENT WAS SEEN.THE TUMOR APPEARED CONFINED TO THE POLYP AS WELL AS SURFACE ENDOMETRIUM. WHERE MYOMETRIAL THICKNESS NOT APPLICABLE. SEROSAL INVOLVEMENT: NOT DEMONSTRATED.  ENDOCERVICAL INVOLVEMENT:NOT DEMONSTRATED. RESECTION MARGINS: FREE OF INVOLVEMENT. EXTRAUTERINE EXTENSION:NOT DEMONSTRATED. ANGIOLYMPHATIC INVASION: NOT DEFINITELY SEEN. TOTAL NODES EXAMINED:22.  PELVIC NODES EXAMINED:20.  PELVIC NODES INVOLVED:0.  PARA-AORTIC NODES 2. EXAMINED:  PARA-AORTIC NODES 0. INVOLVED TNM  STAGE: T1A N0 MX. AJCC STAGE GROUPING: IA. FIGO STAGE:IA.   08/26/2012 Imaging   No CT evidence for intra-abdominal or pelvic metastatic disease. Trace free pelvic fluid, presumably postoperative although the date of surgery is not documented in the electronic medical record.   08/26/2012 - 12/13/2012 Chemotherapy   She received 6 cycles of carbo/taxol    10/29/2012 - 11/27/2012 Radiation Therapy   May 27, June 5,  June 11, June 19, November 27, 2012: Proximal vagina 30 Gy in 5 fractions     01/17/2013 Imaging   1.  No evidence of recurrent or metastatic disease. 2.  No acute abnormality involving the abdomen or pelvis. 3.  Mild diffuse hepatic steatosis. 4.  Very small supraumbilical midline anterior abdominal wall hernia containing fat, unchanged   01/22/2014 Imaging   No evidence for metastatic or recurrent disease. 2. No bowel obstruction.  Normal appendix. 3. Small fat containing hernia, stable in appearance. 4. Status post hysterectomy and bilateral oophorectomy   02/19/2014 Imaging   No pulmonary lesions are identified. The abnormality on the chest x-ray is due to asymmetric left-sided sternoclavicular joint degenerative disease   10/12/2014 Imaging   New mild retroperitoneal lymphadenopathy in the left paraaortic region and proximal left common iliac chain, consistent with metastatic disease. No other sites of metastatic disease identified within the abdomen or pelvis.     11/27/2014 - 02/11/2015 Chemotherapy   She received 4 cycles of carbo/taxol    03/01/2015 PET scan   Single hypermetabolic small retroperitoneal lymph node along the aorta. 2. No evidence of metastatic disease otherwise in the abdomen or pelvis. No evidence local recurrence. 3. Intensely hypermetabolic enlarged nodule adjacent to the RIGHT lobe of thyroid gland. This presumably represents the biopsied lesion in clinician report which was found to  be benign thyroid tissue.    04/05/2015 - 05/13/2015 Radiation Therapy   She received 50.4 gray in 28 fractions with simultaneous integrated boost to 56 gray   06/17/2015 Imaging   No acute process or evidence of metastatic disease in the abdomen or pelvis. Resolution of previously described retroperitoneal adenopathy. 2.  Possible constipation. 3. Atherosclerosis.   06/17/2015 Tumor Marker   Patient's tumor was tested for the following markers: CA125 Results of the tumor marker test revealed 33   08/12/2015 Tumor Marker   Patient's tumor was tested for the following markers: CA125 Results of the tumor marker test revealed 52   08/31/2015 PET scan   Development of right paratracheal hypermetabolic adenopathy, consistent with nodal metastasis. 2. The previously described isolated abdominal retroperitoneal hypermetabolic node has resolved. 3. Persistent hypermetabolic right thyroid nodule, per report previously biopsied. Correlate with those results. 4.  Possible constipation.   09/09/2015 -  Anti-estrogen oral therapy   She has been receiving alternative treatment between megace and tamoxifen   09/09/2015 Tumor Marker   Patient's tumor was tested for the following markers: CA125 Results of the tumor marker test revealed 37.8   09/20/2015 Procedure   Technically successful ultrasound-guided thyroid aspiration biopsy , dominant right nodule   09/20/2015 Pathology Results   THYROID, RIGHT, FINE NEEDLE ASPIRATION (SPECIMEN 1 OF 1 COLLECTED 09/20/15): FINDINGS CONSISTENT WITH BENIGN THYROID NODULE (BETHESDA CATEGORY II).   10/28/2015 Tumor Marker   Patient's tumor was tested for the following markers: CA125 Results of the tumor marker test revealed 53.2   11/04/2015 Imaging   Stable benign right thyroid nodule   12/16/2015 Tumor Marker   Patient's tumor was tested for the following markers: CA125 Results of the tumor marker test revealed 38.1   03/22/2016 PET scan   Interval progression of hypermetabolic right paratracheal  lymph node consistent with metastatic involvement. 2. Stable hypermetabolic right thyroid nodule. Reportedly this has been biopsied in the past.   03/23/2016 Tumor Marker   Patient's tumor was tested for the following markers: CA125 Results of the tumor marker test revealed 62.4   04/13/2016 - 06/01/2016 Radiation Therapy   She received 56 Gy to the chest in 28 fractions    05/09/2016 Imaging   No evidence of left lower extremity deep vein thrombosis. No evidence of a superficial thrombosis of the greater and lesser saphenous veins. Positive for thrombus noted in several varicosities of the calf. No evidence of Baker's cyst on the left.   05/17/2016 Tumor Marker   Patient's tumor was tested for the following markers: CA125 Results of the tumor marker test revealed 9   06/29/2016 Tumor Marker   Patient's tumor was tested for the following markers: CA125 Results of the tumor marker test revealed 5.9   08/04/2016 Tumor Marker   Patient's tumor was tested for the following markers: CA125 Results of the tumor marker test revealed 6.0   09/12/2016 PET scan   Complete metabolic response to therapy, with resolution of hypermetabolic mediastinal lymphadenopathy since prior exam. No residual or new metastatic disease identified. Stable hypermetabolic right thyroid lobe nodule, which was previously biopsied on 09/20/2015.   03/13/2017 PET scan   1. Solitary focus of recurrent right paratracheal hypermetabolic activity, with a 1.0 cm right lower paratracheal node having a maximum SUV of 9.0. Appearance compatible with recurrent malignancy. 2. Continued hypermetabolic right thyroid nodule, previously biopsied, presumed benign -correlate with prior biopsy results. 3. Other imaging findings of potential clinical significance: Aortic Atherosclerosis (ICD10-I70.0). Mild cardiomegaly. Prominent stool  throughout the colon favors constipation.   04/02/2017 Pathology Results   FINE NEEDLE ASPIRATION,  ENDOSCOPIC, (EBUS) 4 R NODE (SPECIMEN 1 OF 2 COLLECTED 04/02/17): MALIGNANT CELLS PRESENT, CONSISTENT WITH CARCINOMA. SEE COMMENT. COMMENT: THE MALIGNANT CELLS ARE POSITIVE FOR P53 AND NEGATIVE FOR ESTROGEN RECEPTOR AND TTF-1. THIS PROFILE IS NON-SPECIFIC, BUT THE P53 POSITIVE STAINING IS SUGGESTIVE OF GYNECOLOGIC PRIMARY.    04/11/2017 Procedure   Successful 8 French right internal jugular vein power port placement with its tip at the SVC/RA junction   04/12/2017 Imaging   Normal LV size with mild LV hypertrophy. EF 55-60%. Normal RV size and systolic function. Aortic valve sclerosis without significant stenosis.   04/18/2017 - 06/14/2017 Chemotherapy   The patient had chemotherapy with Doxil    07/10/2017 Imaging   - Left ventricle: The cavity size was normal. There was mild concentric hypertrophy. Systolic function was normal. The estimated ejection fraction was in the range of 55% to 60%. Wall motion was normal; there were no regional wall motion abnormalities. Doppler parameters are consistent with abnormal left ventricular relaxation (grade 1 diastolic dysfunction). - Aortic valve: Noncoronary cusp mobility was mildly restricted. - Mitral valve: There was mild regurgitation. - Left atrium: The atrium was mildly dilated. - Atrial septum: No defect or patent foramen ovale was identified.  Impressions:  - Compared to November 2018, global LV longitudinal strain remains normal (has increased)   07/11/2017 PET scan   Increased size and hypermetabolic activity of 3.3 cm right thyroid lobe nodule. Thyroid carcinoma cannot be excluded. Recommend repeat ultrasound guided fine needle aspiration to exclude thyroid carcinoma.  New adjacent hypermetabolic 10 mm right supraclavicular lymph node, suspicious for lymph node metastasis.  Slight increase in size and hypermetabolic activity of solitary right paratracheal lymph node.  No evidence of abdominal or pelvic metastatic disease.   07/18/2017  Procedure   1. Technically successful ultrasound guided fine needle aspiration of indeterminate hypermetabolic right-sided thyroid nodule/mass. 2. Technically successful ultrasound-guided core needle biopsy of hypermetabolic right lower cervical lymph node.   07/18/2017 Pathology Results   THYROID, FINE NEEDLE ASPIRATION RIGHT (SPECIMEN 1 OF 1, COLLECTED ON 07/18/2017): ATYPIA OF UNDETERMINED SIGNIFICANCE OR FOLLICULAR LESION OF UNDETERMINED SIGNIFICANCE (BETHESDA CATEGORY III). SEE COMMENT. COMMENT: THE SPECIMEN CONSISTS OF SMALL AND MEDIUM SIZED GROUPS OF FOLLICULAR EPITHELIAL CELLS WITH MILD CYTOLOGIC ATYPIA INCLUDING NUCLEAR ENLARGEMENT AND HURTHLE CELL CHANGE. SOME GROUPS ARE ARRANGED AS MICROFOLLICLES. THERE IS MINIMAL BACKGROUND COLLOID. BASED ON THESE FEATURES, A FOLLICULAR LESION/NEOPLASM CAN NOT BE ENTIRELY RULED OUT. A SPECIMEN WILL BE SENT FOR AFIRMA TESTING.   07/19/2017 Pathology Results   Lymph node, needle/core biopsy - METASTATIC PAPILLARY SEROUS CARCINOMA. - SEE COMMENT. Microscopic Comment Dr. Vicente Males has reviewed the case and concurs with this interpretation   10/15/2017 PET scan   No new or progressive disease. No evidence of abdominal or pelvic metastatic disease.  Stable hypermetabolic right thyroid lobe nodule and adjacent right supraclavicular lymph node.  Decreased size and hypermetabolic activity of solitary right paratracheal lymph node.   01/23/2018 PET scan   1. Overall, no significant change from PET-CT of 3 months ago. There is persistent hypermetabolic activity at the right thoracic inlet and within a right paratracheal mediastinal node. The nodes have not significantly changed in size, although the metabolic activity has minimally increased over this interval. It is uncertain how much of the thoracic inlet metabolic activity is attributed to the thyroid nodule versus adjacent cervical lymph nodes, both previously biopsied. 2. No new hypermetabolic activity  within the neck, chest, abdomen or pelvis. No evidence of local recurrence in the pelvis. 3. Stable probable radiation changes in the right lung.   04/16/2018 PET scan   1. Interval increase in metabolic activity of the RIGHT supraclavicular node and RIGHT lower paratracheal mediastinal node with minimal change in size. Findings consistent with persistent and mildly progressed metastatic adenopathy. 2. New hypermetabolic small lymph node in the RIGHT lower paratracheal nodal station adjacent to the previously followed node. 3. No evidence of local recurrence in the pelvis or new disease elsewhere.   07/30/2018 PET scan   1. Mild interval progression of right supraclavicular, mediastinal, and right hilar hypermetabolic metastatic disease. There is a new hypermetabolic lymph node in the subcarinal station on today's study. 2. No hypermetabolic disease in the abdomen or pelvis to suggest recurrent/metastases. 3.  Aortic Atherosclerois (ICD10-170.0)    08/16/2018 -  Chemotherapy   The patient had Lenvima since 3/6 and pembrolizumab since 3/13 for chemotherapy treatment. Michel Santee was held temporarily due to infection and severe hypertension   10/31/2018 PET scan   Partial response to therapy, with decreased hypermetabolic lymphadenopathy in mediastinum and right hilar region.  No significant change in hypermetabolic lymphadenopathy in right inferior neck.  No new or progressive metastatic disease identified. No evidence of recurrent or metastatic carcinoma in the pelvis or abdomen   02/04/2019 PET scan   1. Decrease in metabolic activity of RIGHT supraclavicular node and RIGHT paratracheal lymph nodes. 2. Persistent activity in the RIGHT thyroid nodule unchanged. 3. Diffuse activity through the esophagus is favored esophagitis. No change. 4. Scattered foci of intense metabolic activity associated the bowel without focal lesion on CT favored benign. 5. No evidence of peritoneal metastasis. 6.  No evidence of metastatic adenopathy in the abdomen pelvis. 7. No evidence of local pelvic sidewall recurrence.   07/30/2019 PET scan   1. Mildly increased uptake within small nodes along the right sternocleidomastoid without change in size and associated with increased deltoid activity may reflect changes related to right arm injection or recent COVID vaccination, consider clinical correlation and attention on follow-up. 2. Persistent activity in the right thyroid nodule, unchanged from previous exams. 3. New activity in a juxta esophageal lymph node in the chest is suspicious though the node is unchanged with respect to size. Endoscopic correlation could potentially be helpful or close attention on follow-up. 4. Subtle increased FDG uptake along the left mainstem bronchus and adjacent to the aorta may represent a small lymph node. No discrete correlate is demonstrated on today's study.   10/22/2019 PET scan   1. Persistent FDG avid subcentimeter right supraclavicular and juxta esophageal lymph nodes. The degree of FDG uptake is similar to the previous exam. No new or progressive findings identified. 2. No findings of solid organ metastasis, skeletal metastasis or metastatic disease to the abdomen or pelvis. 3. FDG avid right lobe of thyroid gland nodule is again noted This nodule is not incidental and been worked up previously with 2 biopsies. Please refer to results from the most recent biopsy dated 07/18/2017. (Ref: J Am Coll Radiol. 2015 Feb;12(2): 143-50). 4.  Aortic Atherosclerosis (ICD10-I70.0).   05/05/2020 PET scan   1. Progressive disease, as evidenced by increased hypermetabolism within right supraclavicular and mediastinal nodes. 2. No infradiaphragmatic hypermetabolic metastasis. 3.  Aortic Atherosclerosis (ICD10-I70.0). 4. 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[place])  07/11/2017  Initial Diagnosis   Metastasis  to supraclavicular lymph node ([place])   08/16/2018 -  Chemotherapy   The patient had pembrolizumab (KEYTRUDA) 200 mg in sodium chloride 0.9 % 50 mL chemo infusion, 200 mg, Intravenous, Once, 27 of 28 cycles Administration: 200 mg (08/16/2018), 200 mg (09/06/2018), 200 mg (09/30/2018), 200 mg (10/21/2018), 200 mg (11/11/2018), 200 mg (12/02/2018), 200 mg (12/23/2018), 200 mg (01/13/2019), 200 mg (02/05/2019), 200 mg (02/27/2019), 200 mg (03/20/2019), 200 mg (04/10/2019), 200 mg (05/08/2019), 200 mg (05/29/2019), 200 mg (06/19/2019), 200 mg (07/10/2019), 200 mg (07/31/2019), 200 mg (08/21/2019), 200 mg (09/11/2019), 200 mg (10/02/2019), 200 mg (10/23/2019), 200 mg (11/13/2019), 200 mg (12/04/2019), 200 mg (12/25/2019), 200 mg (01/15/2020), 200 mg (02/05/2020), 200 mg (02/26/2020)  for chemotherapy treatment.      REVIEW OF SYSTEMS:   Constitutional: Denies fevers, chills or abnormal weight loss Eyes: Denies blurriness of vision Ears, nose, mouth, throat, and face: Denies mucositis or sore throat Respiratory: Denies cough, dyspnea or wheezes Cardiovascular: Denies palpitation, chest discomfort or lower extremity swelling Gastrointestinal:  Denies nausea, heartburn or change in bowel habits Skin: Denies abnormal skin rashes Lymphatics: Denies new lymphadenopathy or easy bruising Neurological:Denies numbness, tingling or new weaknesses Behavioral/Psych: Mood is stable, no new changes  All other systems were reviewed with the patient and are negative.  I have reviewed the past medical history, past surgical history, social history and family history with the patient and they are unchanged from previous note.  ALLERGIES:  has no active allergies.  MEDICATIONS:  Current Outpatient Medications  Medication Sig Dispense Refill  . amLODipine (NORVASC) 10 MG tablet TAKE 1 TABLET BY MOUTH ONCE DAILY. **DECREASE SIMVASTATIN TO 20 MG DAILY WHILE ON AMLODIPINE PER MD** 90 tablet 1  . aspirin 81 MG EC  tablet Take by mouth. (Patient not taking: No sig reported)    . busPIRone (BUSPAR) 10 MG tablet Take 10 mg by mouth 2 (two) times daily.    . Calcium Carb-Cholecalciferol 500-600 MG-UNIT TABS Take 1 tablet by mouth daily.     . Cholecalciferol (VITAMIN D3) 2000 units TABS Take 2,000 Units by mouth daily.     . hydrochlorothiazide (MICROZIDE) 12.5 MG capsule Take 12.5 mg by mouth daily.    Marland Kitchen lenvatinib 10 mg daily dose (LENVIMA, 10 MG DAILY DOSE,) capsule Take 1 capsule (10 mg total) by mouth daily. 30 capsule 11  . levothyroxine (SYNTHROID) 75 MCG tablet TAKE 1 TABLET (75 MCG TOTAL) BY MOUTH DAILY BEFORE BREAKFAST. 90 tablet 2  . lidocaine-prilocaine (EMLA) cream APPLY TO AFFECTED AREA ONCE AS DIRECTED 30 g 3  . LORazepam (ATIVAN) 0.5 MG tablet Take 0.5 mg by mouth 2 (two) times daily as needed.  0  . losartan (COZAAR) 25 MG tablet Take 1 tablet (25 mg total) by mouth daily. 90 tablet 3  . metoprolol tartrate (LOPRESSOR) 25 MG tablet TAKE 1 TABLET (25 MG TOTAL) BY MOUTH 2 (TWO) TIMES DAILY. 180 tablet 3  . ondansetron (ZOFRAN) 8 MG tablet Take 1 tablet (8 mg total) by mouth every 8 (eight) hours as needed for nausea. 30 tablet 3  . pantoprazole (PROTONIX) 40 MG tablet TAKE 1 TABLET BY MOUTH ONCE DAILY 90 tablet 3  . simvastatin (ZOCOR) 20 MG tablet SMARTSIG:1 Tablet(s) By Mouth Every Evening    . tacrolimus (PROTOPIC) 0.1 % ointment Apply 1 application topically daily as needed (rash).     . venlafaxine XR (EFFEXOR-XR) 150 MG 24 hr capsule Take 300 mg by mouth every morning.     No current facility-administered medications  for this visit.   Facility-Administered Medications Ordered in Other Visits  Medication Dose Route Frequency Provider Last Rate Last Admin  . sodium chloride flush (NS) 0.9 % injection 10 mL  10 mL Intracatheter PRN Alvy Bimler, Tanis Hensarling, MD   10 mL at 08/19/20 1300    PHYSICAL EXAMINATION: ECOG PERFORMANCE STATUS: 1 - Symptomatic but completely ambulatory  Vitals:   08/19/20  1117  BP: (!) 118/51  Pulse: 65  Resp: 18  Temp: 97.8 F (36.6 C)  SpO2: 98%   Filed Weights   08/19/20 1117  Weight: 154 lb 9.6 oz (70.1 kg)    GENERAL:alert, no distress and comfortable SKIN: skin color, texture, turgor are normal, no rashes or significant lesions EYES: normal, Conjunctiva are pink and non-injected, sclera clear OROPHARYNX:no exudate, no erythema and lips, buccal mucosa, and tongue normal  NECK: supple, thyroid normal size, non-tender, without nodularity LYMPH:  no palpable lymphadenopathy in the cervical, axillary or inguinal LUNGS: clear to auscultation and percussion with normal breathing effort HEART: regular rate & rhythm and no murmurs and no lower extremity edema ABDOMEN:abdomen soft, non-tender and normal bowel sounds Musculoskeletal:no cyanosis of digits and no clubbing  NEURO: alert & oriented x 3 with fluent speech, no focal motor/sensory deficits.  She is tearful  LABORATORY DATA:  I have reviewed the data as listed    Component Value Date/Time   NA 138 08/19/2020 1048   NA 141 05/17/2017 0957   K 3.9 08/19/2020 1048   K 3.6 05/17/2017 0957   CL 103 08/19/2020 1048   CL 105 11/19/2012 0848   CO2 27 08/19/2020 1048   CO2 25 05/17/2017 0957   GLUCOSE 120 (H) 08/19/2020 1048   GLUCOSE 85 05/17/2017 0957   GLUCOSE 122 (H) 11/19/2012 0848   BUN 22 08/19/2020 1048   BUN 15.2 05/17/2017 0957   CREATININE 0.83 08/19/2020 1048   CREATININE 0.83 07/10/2019 1240   CREATININE 0.8 05/17/2017 0957   CALCIUM 9.3 08/19/2020 1048   CALCIUM 9.6 05/17/2017 0957   PROT 7.2 08/19/2020 1048   PROT 7.0 05/17/2017 0957   ALBUMIN 3.8 08/19/2020 1048   ALBUMIN 3.9 05/17/2017 0957   AST 22 08/19/2020 1048   AST 23 07/10/2019 1240   AST 25 05/17/2017 0957   ALT 20 08/19/2020 1048   ALT 20 07/10/2019 1240   ALT 18 05/17/2017 0957   ALKPHOS 55 08/19/2020 1048   ALKPHOS 57 05/17/2017 0957   BILITOT 0.5 08/19/2020 1048   BILITOT 0.4 07/10/2019 1240    BILITOT 0.47 05/17/2017 0957   GFRNONAA >60 08/19/2020 1048   GFRNONAA >60 07/10/2019 1240   GFRAA >60 02/26/2020 0940   GFRAA >60 07/10/2019 1240    No results found for: SPEP, UPEP  Lab Results  Component Value Date   WBC 3.5 (L) 08/19/2020   NEUTROABS 2.4 08/19/2020   HGB 10.9 (L) 08/19/2020   HCT 33.3 (L) 08/19/2020   MCV 95.7 08/19/2020   PLT 176 08/19/2020      Chemistry      Component Value Date/Time   NA 138 08/19/2020 1048   NA 141 05/17/2017 0957   K 3.9 08/19/2020 1048   K 3.6 05/17/2017 0957   CL 103 08/19/2020 1048   CL 105 11/19/2012 0848   CO2 27 08/19/2020 1048   CO2 25 05/17/2017 0957   BUN 22 08/19/2020 1048   BUN 15.2 05/17/2017 0957   CREATININE 0.83 08/19/2020 1048   CREATININE 0.83 07/10/2019 1240   CREATININE 0.8 05/17/2017  0957   GLU 158 (H) 01/14/2015 1035      Component Value Date/Time   CALCIUM 9.3 08/19/2020 1048   CALCIUM 9.6 05/17/2017 0957   ALKPHOS 55 08/19/2020 1048   ALKPHOS 57 05/17/2017 0957   AST 22 08/19/2020 1048   AST 23 07/10/2019 1240   AST 25 05/17/2017 0957   ALT 20 08/19/2020 1048   ALT 20 07/10/2019 1240   ALT 18 05/17/2017 0957   BILITOT 0.5 08/19/2020 1048   BILITOT 0.4 07/10/2019 1240   BILITOT 0.47 05/17/2017 0957

## 2020-08-19 NOTE — Assessment & Plan Note (Signed)
This is due to side effects of treatment She is not symptomatic Observe only 

## 2020-08-19 NOTE — Progress Notes (Signed)
[place] Work  Clinical Social Work was referred by MD for support after death of husband.  Clinical Social Worker met with patient  to offer support and assess for needs.  Patient reports doing okay right now as she has a lot of family in town and is receiving support and distraction from them.  Pt was accepting of support information to potentially utilize as people leave and she gets back into her day to day routine (works from home).  Patient has information from husband's hospice agency ([facility]) about their grief counseling and accepted information on [facility] as well as this CSW's contact information.       [place], [place] Worker [employer]

## 2020-08-19 NOTE — Assessment & Plan Note (Signed)
She has intermittent elevated TSH I will adjust her thyroid medicine accordingly 

## 2020-08-19 NOTE — Assessment & Plan Note (Addendum)
She has lost some weight due to grieving process She is widowed 2 days ago She is coping well with family support She would appreciate social worker to reach out to her next week for counseling, I have sent a consult

## 2020-08-19 NOTE — Assessment & Plan Note (Signed)
She is tolerating treatment well She just completed radiation therapy recently I plan to order PET CT scan before her next visit We will continue treatment with pembrolizumab and Lenvima

## 2020-08-26 ENCOUNTER — Other Ambulatory Visit (HOSPITAL_COMMUNITY): Payer: Self-pay | Admitting: *Deleted

## 2020-08-26 ENCOUNTER — Other Ambulatory Visit: Payer: Self-pay | Admitting: Hematology and Oncology

## 2020-08-26 MED FILL — busPIRone HCL 10 MG TABS: 10 | 90 days supply | Qty: 180 | Fill #0

## 2020-08-26 MED FILL — LOSARTAN POTASSIUM 25 MG TA: 25 | 30 days supply | Qty: 30 | Fill #5

## 2020-08-31 ENCOUNTER — Other Ambulatory Visit (HOSPITAL_COMMUNITY): Payer: Self-pay

## 2020-09-02 MED FILL — LENVIMA 10 MG DAILY DOSE: 10 | 30 days supply | Qty: 30 | Fill #3

## 2020-09-06 ENCOUNTER — Other Ambulatory Visit (HOSPITAL_COMMUNITY): Payer: Self-pay

## 2020-09-06 DIAGNOSIS — F419 Anxiety disorder, unspecified: Secondary | ICD-10-CM | POA: Diagnosis not present

## 2020-09-06 MED ORDER — VENLAFAXINE HCL ER 150 MG PO CP24
ORAL_CAPSULE | ORAL | 1 refills | Status: DC
  Filled 2020-09-06 – 2020-11-03 (×2): qty 180, 90d supply, fill #0
  Filled 2021-02-03: qty 180, 90d supply, fill #1

## 2020-09-06 MED ORDER — BUSPIRONE HCL 10 MG PO TABS
ORAL_TABLET | ORAL | 1 refills | Status: DC
  Filled 2020-09-06: qty 180, 90d supply, fill #0

## 2020-09-09 ENCOUNTER — Other Ambulatory Visit: Payer: Self-pay

## 2020-09-09 ENCOUNTER — Ambulatory Visit (HOSPITAL_COMMUNITY)
Admission: RE | Admit: 2020-09-09 | Discharge: 2020-09-09 | Disposition: A | Discharge status: Home / Self Care | Payer: 59 | Source: Ambulatory Visit | Attending: Hematology and Oncology | Admitting: Hematology and Oncology

## 2020-09-09 DIAGNOSIS — C541 Malignant neoplasm of endometrium: Secondary | ICD-10-CM | POA: Insufficient documentation

## 2020-09-09 DIAGNOSIS — C55 Malignant neoplasm of uterus, part unspecified: Secondary | ICD-10-CM | POA: Diagnosis not present

## 2020-09-09 LAB — GLUCOSE, CAPILLARY: Glucose-Capillary: 89 mg/dL (ref 70–99)

## 2020-09-09 MED ORDER — FLUDEOXYGLUCOSE F - 18 (FDG) INJECTION
8.0000 | Freq: Once | INTRAVENOUS | Status: AC | PRN
  Administered 2020-09-09: 7.75 via INTRAVENOUS

## 2020-09-10 ENCOUNTER — Inpatient Hospital Stay: Payer: 59

## 2020-09-10 ENCOUNTER — Inpatient Hospital Stay (HOSPITAL_BASED_OUTPATIENT_CLINIC_OR_DEPARTMENT_OTHER): Payer: 59 | Admitting: Hematology and Oncology

## 2020-09-10 ENCOUNTER — Inpatient Hospital Stay: Payer: 59 | Attending: Hematology and Oncology

## 2020-09-10 ENCOUNTER — Other Ambulatory Visit (HOSPITAL_COMMUNITY): Payer: Self-pay

## 2020-09-10 ENCOUNTER — Telehealth: Payer: Self-pay | Admitting: Hematology and Oncology

## 2020-09-10 ENCOUNTER — Encounter: Payer: Self-pay | Admitting: Hematology and Oncology

## 2020-09-10 DIAGNOSIS — K2100 Gastro-esophageal reflux disease with esophagitis, without bleeding: Secondary | ICD-10-CM

## 2020-09-10 DIAGNOSIS — Z5112 Encounter for antineoplastic immunotherapy: Secondary | ICD-10-CM | POA: Diagnosis not present

## 2020-09-10 DIAGNOSIS — R946 Abnormal results of thyroid function studies: Secondary | ICD-10-CM | POA: Diagnosis not present

## 2020-09-10 DIAGNOSIS — C770 Secondary and unspecified malignant neoplasm of lymph nodes of head, face and neck: Secondary | ICD-10-CM | POA: Insufficient documentation

## 2020-09-10 DIAGNOSIS — Z95828 Presence of other vascular implants and grafts: Secondary | ICD-10-CM

## 2020-09-10 DIAGNOSIS — C541 Malignant neoplasm of endometrium: Secondary | ICD-10-CM

## 2020-09-10 DIAGNOSIS — Z79899 Other long term (current) drug therapy: Secondary | ICD-10-CM | POA: Insufficient documentation

## 2020-09-10 DIAGNOSIS — E041 Nontoxic single thyroid nodule: Secondary | ICD-10-CM | POA: Insufficient documentation

## 2020-09-10 DIAGNOSIS — Z923 Personal history of irradiation: Secondary | ICD-10-CM | POA: Insufficient documentation

## 2020-09-10 DIAGNOSIS — I10 Essential (primary) hypertension: Secondary | ICD-10-CM | POA: Diagnosis not present

## 2020-09-10 DIAGNOSIS — E039 Hypothyroidism, unspecified: Secondary | ICD-10-CM

## 2020-09-10 DIAGNOSIS — Z7189 Other specified counseling: Secondary | ICD-10-CM

## 2020-09-10 LAB — CBC WITH DIFFERENTIAL/PLATELET
Abs Immature Granulocytes: 0.01 10*3/uL (ref 0.00–0.07)
Basophils Absolute: 0.1 10*3/uL (ref 0.0–0.1)
Basophils Relative: 1 %
Eosinophils Absolute: 0.2 10*3/uL (ref 0.0–0.5)
Eosinophils Relative: 6 %
HCT: 31.5 % — ABNORMAL LOW (ref 36.0–46.0)
Hemoglobin: 10.2 g/dL — ABNORMAL LOW (ref 12.0–15.0)
Immature Granulocytes: 0 %
Lymphocytes Relative: 20 %
Lymphs Abs: 0.8 10*3/uL (ref 0.7–4.0)
MCH: 31.4 pg (ref 26.0–34.0)
MCHC: 32.4 g/dL (ref 30.0–36.0)
MCV: 96.9 fL (ref 80.0–100.0)
Monocytes Absolute: 0.4 10*3/uL (ref 0.1–1.0)
Monocytes Relative: 10 %
Neutro Abs: 2.3 10*3/uL (ref 1.7–7.7)
Neutrophils Relative %: 63 %
Platelets: 169 10*3/uL (ref 150–400)
RBC: 3.25 MIL/uL — ABNORMAL LOW (ref 3.87–5.11)
RDW: 13.2 % (ref 11.5–15.5)
WBC: 3.8 10*3/uL — ABNORMAL LOW (ref 4.0–10.5)
nRBC: 0 % (ref 0.0–0.2)

## 2020-09-10 LAB — COMPREHENSIVE METABOLIC PANEL
ALT: 19 U/L (ref 0–44)
AST: 22 U/L (ref 15–41)
Albumin: 3.7 g/dL (ref 3.5–5.0)
Alkaline Phosphatase: 52 U/L (ref 38–126)
Anion gap: 11 (ref 5–15)
BUN: 18 mg/dL (ref 8–23)
CO2: 25 mmol/L (ref 22–32)
Calcium: 8.9 mg/dL (ref 8.9–10.3)
Chloride: 101 mmol/L (ref 98–111)
Creatinine, Ser: 0.8 mg/dL (ref 0.44–1.00)
GFR, Estimated: >60 mL/min (ref >60)
Glucose, Bld: 81 mg/dL (ref 70–99)
Potassium: 4.5 mmol/L (ref 3.5–5.1)
Sodium: 137 mmol/L (ref 135–145)
Total Bilirubin: 0.5 mg/dL (ref 0.3–1.2)
Total Protein: 6.9 g/dL (ref 6.5–8.1)

## 2020-09-10 LAB — TSH: TSH: 2.77 u[IU]/mL (ref 0.308–3.960)

## 2020-09-10 MED ORDER — HEPARIN SOD (PORK) LOCK FLUSH 100 UNIT/ML IV SOLN
500.0000 [IU] | Freq: Once | INTRAVENOUS | Status: AC | PRN
  Administered 2020-09-10: 500 [IU]
  Filled 2020-09-10: qty 5

## 2020-09-10 MED ORDER — SODIUM CHLORIDE 0.9 % IV SOLN
200.0000 mg | Freq: Once | INTRAVENOUS | Status: AC
  Administered 2020-09-10: 200 mg via INTRAVENOUS
  Filled 2020-09-10: qty 8

## 2020-09-10 MED ORDER — SODIUM CHLORIDE 0.9% FLUSH
10.0000 mL | Freq: Once | INTRAVENOUS | Status: AC
  Administered 2020-09-10: 10 mL
  Filled 2020-09-10: qty 10

## 2020-09-10 MED ORDER — SODIUM CHLORIDE 0.9 % IV SOLN
Freq: Once | INTRAVENOUS | Status: AC
  Filled 2020-09-10: qty 250

## 2020-09-10 MED ORDER — HYDROCHLOROTHIAZIDE 12.5 MG PO CAPS
12.5000 mg | ORAL_CAPSULE | Freq: Every day | ORAL | 3 refills | Status: DC
  Filled 2020-09-10: qty 90, 90d supply, fill #0
  Filled 2020-12-07: qty 90, 90d supply, fill #1
  Filled 2021-03-10: qty 90, 90d supply, fill #2
  Filled 2021-06-08: qty 90, 90d supply, fill #3

## 2020-09-10 MED ORDER — SODIUM CHLORIDE 0.9% FLUSH
10.0000 mL | INTRAVENOUS | Status: DC | PRN
  Administered 2020-09-10: 10 mL
  Filled 2020-09-10: qty 10

## 2020-09-10 NOTE — Patient Instructions (Signed)
[facility] Discharge Instructions for Patients Receiving Chemotherapy  Today you received the following chemotherapy agents keytruda  To help prevent nausea and vomiting after your treatment, we encourage you to take your nausea medication as directed If you develop nausea and vomiting that is not controlled by your nausea medication, call the clinic.   BELOW ARE SYMPTOMS THAT SHOULD BE REPORTED IMMEDIATELY:  *FEVER GREATER THAN 100.5 F  *CHILLS WITH OR WITHOUT FEVER  NAUSEA AND VOMITING THAT IS NOT CONTROLLED WITH YOUR NAUSEA MEDICATION  *UNUSUAL SHORTNESS OF BREATH  *UNUSUAL BRUISING OR BLEEDING  TENDERNESS IN MOUTH AND THROAT WITH OR WITHOUT PRESENCE OF ULCERS  *URINARY PROBLEMS  *BOWEL PROBLEMS  UNUSUAL RASH Items with * indicate a potential emergency and should be followed up as soon as possible.  Feel free to call the clinic should you have any questions or concerns. The clinic phone number is [phone].  Please show the CHEMO ALERT CARD at check-in to the Emergency Department and triage nurse.   

## 2020-09-10 NOTE — Telephone Encounter (Signed)
Scheduled follow-up appointment per 4/8 schedule message. Patient is aware.

## 2020-09-10 NOTE — Patient Instructions (Signed)
Implanted Port Insertion, Care After This sheet gives you information about how to care for yourself after your procedure. Your health care provider may also give you more specific instructions. If you have problems or questions, contact your health care provider. What can I expect after the procedure? After the procedure, it is common to have:  Discomfort at the port insertion site.  Bruising on the skin over the port. This should improve over 3-4 days. Follow these instructions at home: Port care  After your port is placed, you will get a manufacturer's information card. The card has information about your port. Keep this card with you at all times.  Take care of the port as told by your health care provider. Ask your health care provider if you or a family member can get training for taking care of the port at home. A home health care nurse may also take care of the port.  Make sure to remember what type of port you have. Incision care  Follow instructions from your health care provider about how to take care of your port insertion site. Make sure you: ? Wash your hands with soap and water before and after you change your bandage (dressing). If soap and water are not available, use hand sanitizer. ? Change your dressing as told by your health care provider. ? Leave stitches (sutures), skin glue, or adhesive strips in place. These skin closures may need to stay in place for 2 weeks or longer. If adhesive strip edges start to loosen and curl up, you may trim the loose edges. Do not remove adhesive strips completely unless your health care provider tells you to do that.  Check your port insertion site every day for signs of infection. Check for: ? Redness, swelling, or pain. ? Fluid or blood. ? Warmth. ? Pus or a bad smell.      Activity  Return to your normal activities as told by your health care provider. Ask your health care provider what activities are safe for you.  Do not  lift anything that is heavier than 10 lb (4.5 kg), or the limit that you are told, until your health care provider says that it is safe. General instructions  Take over-the-counter and prescription medicines only as told by your health care provider.  Do not take baths, swim, or use a hot tub until your health care provider approves. Ask your health care provider if you may take showers. You may only be allowed to take sponge baths.  Do not drive for 24 hours if you were given a sedative during your procedure.  Wear a medical alert bracelet in case of an emergency. This will tell any health care providers that you have a port.  Keep all follow-up visits as told by your health care provider. This is important. Contact a health care provider if:  You cannot flush your port with saline as directed, or you cannot draw blood from the port.  You have a fever or chills.  You have redness, swelling, or pain around your port insertion site.  You have fluid or blood coming from your port insertion site.  Your port insertion site feels warm to the touch.  You have pus or a bad smell coming from the port insertion site. Get help right away if:  You have chest pain or shortness of breath.  You have bleeding from your port that you cannot control. Summary  Take care of the port as told by your   health care provider. Keep the manufacturer's information card with you at all times.  Change your dressing as told by your health care provider.  Contact a health care provider if you have a fever or chills or if you have redness, swelling, or pain around your port insertion site.  Keep all follow-up visits as told by your health care provider. This information is not intended to replace advice given to you by your health care provider. Make sure you discuss any questions you have with your health care provider. Document Revised: 12/18/2017 Document Reviewed: 12/18/2017 [employer] Patient Education   2021 [employer]  

## 2020-09-10 NOTE — Assessment & Plan Note (Signed)
Imaging study reveals signs of esophagitis She will continue pantoprazole

## 2020-09-10 NOTE — Assessment & Plan Note (Signed)
She had prior evaluation for her thyroid nodule in the past Continue to observe for now She will continue levothyroxine as prescribed

## 2020-09-10 NOTE — Progress Notes (Signed)
[facility] OFFICE PROGRESS NOTE  Patient Care Team: [name], MD as PCP - General (Family Medicine) Reynold Bowen, MD as Consulting Physician (Endocrinology)  ASSESSMENT & PLAN:  Endometrial cancer [facility]) I have reviewed multiple imaging studies with the patient She has excellent response of palliative radiation therapy We will continue Lenvima with Keytruda indefinitely I plan to repeat imaging study in 6 months  Essential hypertension She has intermittent elevated blood pressure I refill her prescriptions today I plan to restart checking for proteinuria in the future  Esophagitis, reflux Imaging study reveals signs of esophagitis She will continue pantoprazole  Abnormal thyroid scan She had prior evaluation for her thyroid nodule in the past Continue to observe for now She will continue levothyroxine as prescribed   Orders Placed This Encounter  Procedures  . Total Protein, Urine dipstick    Standing Status:   Standing    Number of Occurrences:   9    Standing Expiration Date:   09/10/2021    All questions were answered. The patient knows to call the clinic with any problems, questions or concerns. The total time spent in the appointment was 30 minutes encounter with patients including review of chart and various tests results, discussions about plan of care and coordination of care plan   [name], MD 09/10/2020 1:02 PM  INTERVAL HISTORY: Please see below for problem oriented charting. She returns for further follow-up She is doing well No side effects of treatment recently Denies signs of throat pain or esophagitis She has not been checking her blood pressure at home She denies recent headaches  SUMMARY OF ONCOLOGIC HISTORY: Oncology History Overview Note  [employer] testing done 11-2015 on surgical path from 2014: MS stable TMB low 4 muts/mb ATM [id number] ERBB3 T389K E2H2 rearrangement exon 9 PPP2R1A P179R TP53 I195N     ER- APPROXIMATELY 25-35% STAINING IN NEOPLASTIC CELLS (INTERMEDIATE)  PR- APPROXIMATELY 25-35% STAINING IN NEOPLASTIC CELLS (STRONG)  Repeat biopsy 04/02/17: ER negative, Her 2 negative  Progressed on Doxil   Endometrial cancer ([place])  06/20/2012 Pathology Results   Biopsy positive for papillary serous carcinoma   06/20/2012 Genetic Testing   Foundation One testing done 11-2015 on surgical path from 2014: MS stable TMB low 4 muts/mb ATM [id number] ERBB3 T389K E2H2 rearrangement exon 9 PPP2R1A P179R TP53 I195N    06/20/2012 Initial Diagnosis   Patient presented to PCP with intermittent vaginal bleeding since ~ Oct 2013, endometrial biopsy 06-20-12 with complex endometrial hyperplasia with atypia   06/26/2012 Imaging   Thickened endometrial lining in a postmenopausal patient experiencing vaginal bleeding. In the setting of post-menopausal bleeding, endometrial sampling is indicated to exclude carcinoma. No focal myometrial abnormalities are seen.  Normal left ovary and non-visualized right ovary   07/30/2012 Surgery   Dr. Polly Cobia performed robotic hysterectomy with bilateral salpingo-oophorectomy, bilateral pelvic lymph node dissection and periaortic lymph node dissection. Intraoperatively on frozen section, the patient was noted to have a large endometrial polyp with changes within the polyp consistent for high-grade malignancy, possibly papillary serous carcinoma. There is no obvious extrauterine disease noted.     07/30/2012 Pathology Results   [phone] SUPPLEMENTAL REPORT  THE ENDOMETRIAL CARCINOMA WAS ANALYZED FOR DNA MISMATCH REPAIR PROTEINS.IMMUNOHISTOCHEMICALLY, THE NEOPLASM RETAINED NUCLEAR EXPRESSION OF 4 GENE PRODUCTS, MLH1, MSH2, MSH6, AND PMS2, INVOLVED IN DNA MISMATCH REPAIR.POSITIVE AND NEGATIVE CONTROLS WORKED APPROPRIATELY.  PER REQUEST, AN ER AND PR ARE PERFORMED ON BLOCK 1I.  ER- APPROXIMATELY 25-35% STAINING IN NEOPLASTIC CELLS  (INTERMEDIATE)  PR- APPROXIMATELY 25-35% STAINING IN NEOPLASTIC CELLS (STRONG)  ER AND PR PREDOMINANTLY SHOW STAINING IN THE SEROUS COMPONENT.  DIAGNOSIS  1. UTERUS, CERVIX WITH BILATERAL FALLOPIAN TUBES AND OVARIES,  HYSTERECTOMY AND BILATERAL SALPINGO-OOPHORECTOMY:  HIGH GRADE/POORLY DIFFERENTIATED ENDOMETRIAL ADENOCARCINOMA WITH SOLID AND SEROUS PAPILLARY COMPONENTS.  HISTOPATHOLOGIC TYPE:A VARIETY OF PATTERNS WERE PRESENT, ALL OF WHICH SHOULD BE CONSIDERED TO BE HIGH GRADE.SEROUS PAPILLARY CARCINOMA WAS SEEN OCCURRING ADJACENT TO SOLID ENDOMETRIAL CARCINOMA WITH MARKED ANAPLASIA AND GIANT CELLS. SIZE:TUMOR MEASURED AT LEAST 4.8 CM IN GREATEST HORIZONTAL DIMENSION.  GRADE: POORLY DIFFERENTIATED OR HIGH GRADE. DEPTH OF INVASION: NO DEFINITE MYOMETRIAL INVOLVEMENT WAS SEEN.THE TUMOR APPEARED CONFINED TO THE POLYP AS WELL AS SURFACE ENDOMETRIUM. WHERE MYOMETRIAL THICKNESS NOT APPLICABLE. SEROSAL INVOLVEMENT: NOT DEMONSTRATED.  ENDOCERVICAL INVOLVEMENT:NOT DEMONSTRATED. RESECTION MARGINS: FREE OF INVOLVEMENT. EXTRAUTERINE EXTENSION:NOT DEMONSTRATED. ANGIOLYMPHATIC INVASION: NOT DEFINITELY SEEN. TOTAL NODES EXAMINED:22.  PELVIC NODES EXAMINED:20.  PELVIC NODES INVOLVED:0.  PARA-AORTIC NODES 2. EXAMINED:  PARA-AORTIC NODES 0. INVOLVED TNM STAGE: T1A N0 MX. AJCC STAGE GROUPING: IA. FIGO STAGE:IA.   08/26/2012 Imaging   No CT evidence for intra-abdominal or pelvic metastatic disease. Trace free pelvic fluid, presumably postoperative although the date of surgery is not documented in the electronic medical record.   08/26/2012 - 12/13/2012 Chemotherapy   She received 6 cycles of carbo/taxol    10/29/2012 - 11/27/2012 Radiation Therapy   May 27, June 5,  June 11, June 19, November 27, 2012: Proximal vagina 30 Gy in 5 fractions     01/17/2013 Imaging   1.  No evidence of recurrent or metastatic  disease. 2.  No acute abnormality involving the abdomen or pelvis. 3.  Mild diffuse hepatic steatosis. 4.  Very small supraumbilical midline anterior abdominal wall hernia containing fat, unchanged   01/22/2014 Imaging   No evidence for metastatic or recurrent disease. 2. No bowel obstruction.  Normal appendix. 3. Small fat containing hernia, stable in appearance. 4. Status post hysterectomy and bilateral oophorectomy   02/19/2014 Imaging   No pulmonary lesions are identified. The abnormality on the chest x-ray is due to asymmetric left-sided sternoclavicular joint degenerative disease   10/12/2014 Imaging   New mild retroperitoneal lymphadenopathy in the left paraaortic region and proximal left common iliac chain, consistent with metastatic disease. No other sites of metastatic disease identified within the abdomen or pelvis.     11/27/2014 - 02/11/2015 Chemotherapy   She received 4 cycles of carbo/taxol    03/01/2015 PET scan   Single hypermetabolic small retroperitoneal lymph node along the aorta. 2. No evidence of metastatic disease otherwise in the abdomen or pelvis. No evidence local recurrence. 3. Intensely hypermetabolic enlarged nodule adjacent to the RIGHT lobe of thyroid gland. This presumably represents the biopsied lesion in clinician report which was found to be benign thyroid tissue.   04/05/2015 - 05/13/2015 Radiation Therapy   She received 50.4 gray in 28 fractions with simultaneous integrated boost to 56 gray   06/17/2015 Imaging   No acute process or evidence of metastatic disease in the abdomen or pelvis. Resolution of previously described retroperitoneal adenopathy. 2.  Possible constipation. 3. Atherosclerosis.   06/17/2015 Tumor Marker   Patient's tumor was tested for the following markers: CA125 Results of the tumor marker test revealed 33   08/12/2015 Tumor Marker   Patient's tumor was tested for the following markers: CA125 Results of the tumor marker test revealed 52    08/31/2015 PET scan   Development of right paratracheal hypermetabolic adenopathy, consistent with nodal metastasis. 2. The  previously described isolated abdominal retroperitoneal hypermetabolic node has resolved. 3. Persistent hypermetabolic right thyroid nodule, per report previously biopsied. Correlate with those results. 4.  Possible constipation.   09/09/2015 -  Anti-estrogen oral therapy   She has been receiving alternative treatment between megace and tamoxifen   09/09/2015 Tumor Marker   Patient's tumor was tested for the following markers: CA125 Results of the tumor marker test revealed 37.8   09/20/2015 Procedure   Technically successful ultrasound-guided thyroid aspiration biopsy , dominant right nodule   09/20/2015 Pathology Results   THYROID, RIGHT, FINE NEEDLE ASPIRATION (SPECIMEN 1 OF 1 COLLECTED 09/20/15): FINDINGS CONSISTENT WITH BENIGN THYROID NODULE (BETHESDA CATEGORY II).   10/28/2015 Tumor Marker   Patient's tumor was tested for the following markers: CA125 Results of the tumor marker test revealed 53.2   11/04/2015 Imaging   Stable benign right thyroid nodule   12/16/2015 Tumor Marker   Patient's tumor was tested for the following markers: CA125 Results of the tumor marker test revealed 38.1   03/22/2016 PET scan   Interval progression of hypermetabolic right paratracheal lymph node consistent with metastatic involvement. 2. Stable hypermetabolic right thyroid nodule. Reportedly this has been biopsied in the past.   03/23/2016 Tumor Marker   Patient's tumor was tested for the following markers: CA125 Results of the tumor marker test revealed 62.4   04/13/2016 - 06/01/2016 Radiation Therapy   She received 56 Gy to the chest in 28 fractions    05/09/2016 Imaging   No evidence of left lower extremity deep vein thrombosis. No evidence of a superficial thrombosis of the greater and lesser saphenous veins. Positive for thrombus noted in several varicosities of the calf. No  evidence of Baker's cyst on the left.   05/17/2016 Tumor Marker   Patient's tumor was tested for the following markers: CA125 Results of the tumor marker test revealed 9   06/29/2016 Tumor Marker   Patient's tumor was tested for the following markers: CA125 Results of the tumor marker test revealed 5.9   08/04/2016 Tumor Marker   Patient's tumor was tested for the following markers: CA125 Results of the tumor marker test revealed 6.0   09/12/2016 PET scan   Complete metabolic response to therapy, with resolution of hypermetabolic mediastinal lymphadenopathy since prior exam. No residual or new metastatic disease identified. Stable hypermetabolic right thyroid lobe nodule, which was previously biopsied on 09/20/2015.   03/13/2017 PET scan   1. Solitary focus of recurrent right paratracheal hypermetabolic activity, with a 1.0 cm right lower paratracheal node having a maximum SUV of 9.0. Appearance compatible with recurrent malignancy. 2. Continued hypermetabolic right thyroid nodule, previously biopsied, presumed benign -correlate with prior biopsy results. 3. Other imaging findings of potential clinical significance: Aortic Atherosclerosis (ICD10-I70.0). Mild cardiomegaly. Prominent stool throughout the colon favors constipation.   04/02/2017 Pathology Results   FINE NEEDLE ASPIRATION, ENDOSCOPIC, (EBUS) 4 R NODE (SPECIMEN 1 OF 2 COLLECTED 04/02/17): MALIGNANT CELLS PRESENT, CONSISTENT WITH CARCINOMA. SEE COMMENT. COMMENT: THE MALIGNANT CELLS ARE POSITIVE FOR P53 AND NEGATIVE FOR ESTROGEN RECEPTOR AND TTF-1. THIS PROFILE IS NON-SPECIFIC, BUT THE P53 POSITIVE STAINING IS SUGGESTIVE OF GYNECOLOGIC PRIMARY.    04/11/2017 Procedure   Successful 8 French right internal jugular vein power port placement with its tip at the SVC/RA junction   04/12/2017 Imaging   Normal LV size with mild LV hypertrophy. EF 55-60%. Normal RV size and systolic function. Aortic valve sclerosis without significant  stenosis.   04/18/2017 - 06/14/2017 Chemotherapy   The patient had  chemotherapy with Doxil    07/10/2017 Imaging   - Left ventricle: The cavity size was normal. There was mild concentric hypertrophy. Systolic function was normal. The estimated ejection fraction was in the range of 55% to 60%. Wall motion was normal; there were no regional wall motion abnormalities. Doppler parameters are consistent with abnormal left ventricular relaxation (grade 1 diastolic dysfunction). - Aortic valve: Noncoronary cusp mobility was mildly restricted. - Mitral valve: There was mild regurgitation. - Left atrium: The atrium was mildly dilated. - Atrial septum: No defect or patent foramen ovale was identified.  Impressions:  - Compared to November 2018, global LV longitudinal strain remains normal (has increased)   07/11/2017 PET scan   Increased size and hypermetabolic activity of 3.3 cm right thyroid lobe nodule. Thyroid carcinoma cannot be excluded. Recommend repeat ultrasound guided fine needle aspiration to exclude thyroid carcinoma.  New adjacent hypermetabolic 10 mm right supraclavicular lymph node, suspicious for lymph node metastasis.  Slight increase in size and hypermetabolic activity of solitary right paratracheal lymph node.  No evidence of abdominal or pelvic metastatic disease.   07/18/2017 Procedure   1. Technically successful ultrasound guided fine needle aspiration of indeterminate hypermetabolic right-sided thyroid nodule/mass. 2. Technically successful ultrasound-guided core needle biopsy of hypermetabolic right lower cervical lymph node.   07/18/2017 Pathology Results   THYROID, FINE NEEDLE ASPIRATION RIGHT (SPECIMEN 1 OF 1, COLLECTED ON 07/18/2017): ATYPIA OF UNDETERMINED SIGNIFICANCE OR FOLLICULAR LESION OF UNDETERMINED SIGNIFICANCE (BETHESDA CATEGORY III). SEE COMMENT. COMMENT: THE SPECIMEN CONSISTS OF SMALL AND MEDIUM SIZED GROUPS OF FOLLICULAR EPITHELIAL CELLS WITH MILD CYTOLOGIC  ATYPIA INCLUDING NUCLEAR ENLARGEMENT AND HURTHLE CELL CHANGE. SOME GROUPS ARE ARRANGED AS MICROFOLLICLES. THERE IS MINIMAL BACKGROUND COLLOID. BASED ON THESE FEATURES, A FOLLICULAR LESION/NEOPLASM CAN NOT BE ENTIRELY RULED OUT. A SPECIMEN WILL BE SENT FOR AFIRMA TESTING.   07/19/2017 Pathology Results   Lymph node, needle/core biopsy - METASTATIC PAPILLARY SEROUS CARCINOMA. - SEE COMMENT. Microscopic Comment Dr. Vicente Males has reviewed the case and concurs with this interpretation   10/15/2017 PET scan   No new or progressive disease. No evidence of abdominal or pelvic metastatic disease.  Stable hypermetabolic right thyroid lobe nodule and adjacent right supraclavicular lymph node.  Decreased size and hypermetabolic activity of solitary right paratracheal lymph node.   01/23/2018 PET scan   1. Overall, no significant change from PET-CT of 3 months ago. There is persistent hypermetabolic activity at the right thoracic inlet and within a right paratracheal mediastinal node. The nodes have not significantly changed in size, although the metabolic activity has minimally increased over this interval. It is uncertain how much of the thoracic inlet metabolic activity is attributed to the thyroid nodule versus adjacent cervical lymph nodes, both previously biopsied. 2. No new hypermetabolic activity within the neck, chest, abdomen or pelvis. No evidence of local recurrence in the pelvis. 3. Stable probable radiation changes in the right lung.   04/16/2018 PET scan   1. Interval increase in metabolic activity of the RIGHT supraclavicular node and RIGHT lower paratracheal mediastinal node with minimal change in size. Findings consistent with persistent and mildly progressed metastatic adenopathy. 2. New hypermetabolic small lymph node in the RIGHT lower paratracheal nodal station adjacent to the previously followed node. 3. No evidence of local recurrence in the pelvis or new disease elsewhere.    07/30/2018 PET scan   1. Mild interval progression of right supraclavicular, mediastinal, and right hilar hypermetabolic metastatic disease. There is a new hypermetabolic lymph node in the subcarinal  station on today's study. 2. No hypermetabolic disease in the abdomen or pelvis to suggest recurrent/metastases. 3.  Aortic Atherosclerois (ICD10-170.0)    08/16/2018 -  Chemotherapy   The patient had Lenvima since 3/6 and pembrolizumab since 3/13 for chemotherapy treatment. Michel Santee was held temporarily due to infection and severe hypertension   10/31/2018 PET scan   Partial response to therapy, with decreased hypermetabolic lymphadenopathy in mediastinum and right hilar region.  No significant change in hypermetabolic lymphadenopathy in right inferior neck.  No new or progressive metastatic disease identified. No evidence of recurrent or metastatic carcinoma in the pelvis or abdomen   02/04/2019 PET scan   1. Decrease in metabolic activity of RIGHT supraclavicular node and RIGHT paratracheal lymph nodes. 2. Persistent activity in the RIGHT thyroid nodule unchanged. 3. Diffuse activity through the esophagus is favored esophagitis. No change. 4. Scattered foci of intense metabolic activity associated the bowel without focal lesion on CT favored benign. 5. No evidence of peritoneal metastasis. 6. No evidence of metastatic adenopathy in the abdomen pelvis. 7. No evidence of local pelvic sidewall recurrence.   07/30/2019 PET scan   1. Mildly increased uptake within small nodes along the right sternocleidomastoid without change in size and associated with increased deltoid activity may reflect changes related to right arm injection or recent COVID vaccination, consider clinical correlation and attention on follow-up. 2. Persistent activity in the right thyroid nodule, unchanged from previous exams. 3. New activity in a juxta esophageal lymph node in the chest is suspicious though the node is  unchanged with respect to size. Endoscopic correlation could potentially be helpful or close attention on follow-up. 4. Subtle increased FDG uptake along the left mainstem bronchus and adjacent to the aorta may represent a small lymph node. No discrete correlate is demonstrated on today's study.   10/22/2019 PET scan   1. Persistent FDG avid subcentimeter right supraclavicular and juxta esophageal lymph nodes. The degree of FDG uptake is similar to the previous exam. No new or progressive findings identified. 2. No findings of solid organ metastasis, skeletal metastasis or metastatic disease to the abdomen or pelvis. 3. FDG avid right lobe of thyroid gland nodule is again noted This nodule is not incidental and been worked up previously with 2 biopsies. Please refer to results from the most recent biopsy dated 07/18/2017. (Ref: J Am Coll Radiol. 2015 Feb;12(2): 143-50). 4.  Aortic Atherosclerosis (ICD10-I70.0).   05/05/2020 PET scan   1. Progressive disease, as evidenced by increased hypermetabolism within right supraclavicular and mediastinal nodes. 2. No infradiaphragmatic hypermetabolic metastasis. 3.  Aortic Atherosclerosis (ICD10-I70.0). 4. Hypermetabolic right thyroid nodule has been detailed on prior exams and was biopsied on 07/18/2017   09/10/2020 PET scan   1. Interval improvement in previously demonstrated hypermetabolic mediastinal and right supraclavicular adenopathy. A single hypermetabolic left paraesophageal lymph node remains, improved from previous study. The other nodes have resolved.  2. No progressive disease. 3. Diffuse esophageal hypermetabolic activity suggesting esophagitis. 4. Hypermetabolic right thyroid nodule has been previously biopsied.   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[place])   08/16/2018 -  Chemotherapy   The patient had pembrolizumab (KEYTRUDA) 200 mg in sodium chloride 0.9 %  50 mL chemo infusion, 200 mg, Intravenous, Once, 27 of 28 cycles Administration: 200 mg (08/16/2018), 200 mg (09/06/2018), 200 mg (09/30/2018), 200 mg (10/21/2018), 200 mg (11/11/2018), 200 mg (12/02/2018), 200 mg (12/23/2018), 200 mg (01/13/2019), 200 mg (02/05/2019), 200 mg (02/27/2019),  200 mg (03/20/2019), 200 mg (04/10/2019), 200 mg (05/08/2019), 200 mg (05/29/2019), 200 mg (06/19/2019), 200 mg (07/10/2019), 200 mg (07/31/2019), 200 mg (08/21/2019), 200 mg (09/11/2019), 200 mg (10/02/2019), 200 mg (10/23/2019), 200 mg (11/13/2019), 200 mg (12/04/2019), 200 mg (12/25/2019), 200 mg (01/15/2020), 200 mg (02/05/2020), 200 mg (02/26/2020)  for chemotherapy treatment.      REVIEW OF SYSTEMS:   Constitutional: Denies fevers, chills or abnormal weight loss Eyes: Denies blurriness of vision Ears, nose, mouth, throat, and face: Denies mucositis or sore throat Respiratory: Denies cough, dyspnea or wheezes Cardiovascular: Denies palpitation, chest discomfort or lower extremity swelling Gastrointestinal:  Denies nausea, heartburn or change in bowel habits Skin: Denies abnormal skin rashes Lymphatics: Denies new lymphadenopathy or easy bruising Neurological:Denies numbness, tingling or new weaknesses Behavioral/Psych: Mood is stable, no new changes  All other systems were reviewed with the patient and are negative.  I have reviewed the past medical history, past surgical history, social history and family history with the patient and they are unchanged from previous note.  ALLERGIES:  has no active allergies.  MEDICATIONS:  Current Outpatient Medications  Medication Sig Dispense Refill  . amLODipine (NORVASC) 10 MG tablet TAKE 1 TABLET BY MOUTH ONCE DAILY. **DECREASE SIMVASTATIN TO 20 MG DAILY WHILE ON AMLODIPINE PER MD** 90 tablet 1  . busPIRone (BUSPAR) 10 MG tablet TAKE 1 TABLET BY MOUTH TWICE DAILY 180 tablet 0  . Calcium Carb-Cholecalciferol 500-600 MG-UNIT TABS Take 1 tablet by mouth daily.     . Cholecalciferol (VITAMIN  D3) 2000 units TABS Take 2,000 Units by mouth daily.     . hydrochlorothiazide (MICROZIDE) 12.5 MG capsule Take 1 capsule (12.5 mg total) by mouth daily. 90 capsule 3  . lenvatinib 10 mg daily dose (LENVIMA) capsule TAKE 1 CAPSULE (10 MG TOTAL) BY MOUTH DAILY. 30 each 11  . levothyroxine (SYNTHROID) 75 MCG tablet TAKE 1 TABLET BY MOUTH DAILY BEFORE BREAKFAST. 90 tablet 2  . lidocaine-prilocaine (EMLA) cream APPLY TO AFFECTED AREA ONCE AS DIRECTED 30 g 3  . LORazepam (ATIVAN) 0.5 MG tablet Take 0.5 mg by mouth 2 (two) times daily as needed.  0  . losartan (COZAAR) 25 MG tablet TAKE 1 TABLET BY MOUTH DAILY 90 tablet 3  . metoprolol tartrate (LOPRESSOR) 25 MG tablet TAKE 1 TABLET BY MOUTH TWICE DAILY 180 tablet 3  . ondansetron (ZOFRAN) 8 MG tablet Take 1 tablet (8 mg total) by mouth every 8 (eight) hours as needed for nausea. 30 tablet 3  . pantoprazole (PROTONIX) 40 MG tablet TAKE 1 TABLET BY MOUTH ONCE DAILY 90 tablet 3  . simvastatin (ZOCOR) 20 MG tablet SMARTSIG:1 Tablet(s) By Mouth Every Evening    . tacrolimus (PROTOPIC) 0.1 % ointment Apply 1 application topically daily as needed (rash).     . venlafaxine XR (EFFEXOR-XR) 150 MG 24 hr capsule Take 2 tablets by mouth each morning with food 180 capsule 1   No current facility-administered medications for this visit.    PHYSICAL EXAMINATION: ECOG PERFORMANCE STATUS: 1 - Symptomatic but completely ambulatory  Vitals:   09/10/20 1219  BP: (!) 148/79  Pulse: 70  Resp: 19  Temp: 98.2 F (36.8 C)  SpO2: 99%   Filed Weights   09/10/20 1219  Weight: 160 lb 9.6 oz (72.8 kg)    GENERAL:alert, no distress and comfortable NEURO: alert & oriented x 3 with fluent speech, no focal motor/sensory deficits  LABORATORY DATA:  I have reviewed the data as listed    Component Value  Date/Time   NA 137 09/10/2020 1215   NA 141 05/17/2017 0957   K 4.5 09/10/2020 1215   K 3.6 05/17/2017 0957   CL 101 09/10/2020 1215   CL 105 11/19/2012 0848    CO2 25 09/10/2020 1215   CO2 25 05/17/2017 0957   GLUCOSE 81 09/10/2020 1215   GLUCOSE 85 05/17/2017 0957   GLUCOSE 122 (H) 11/19/2012 0848   BUN 18 09/10/2020 1215   BUN 15.2 05/17/2017 0957   CREATININE 0.80 09/10/2020 1215   CREATININE 0.83 07/10/2019 1240   CREATININE 0.8 05/17/2017 0957   CALCIUM 8.9 09/10/2020 1215   CALCIUM 9.6 05/17/2017 0957   PROT 6.9 09/10/2020 1215   PROT 7.0 05/17/2017 0957   ALBUMIN 3.7 09/10/2020 1215   ALBUMIN 3.9 05/17/2017 0957   AST 22 09/10/2020 1215   AST 23 07/10/2019 1240   AST 25 05/17/2017 0957   ALT 19 09/10/2020 1215   ALT 20 07/10/2019 1240   ALT 18 05/17/2017 0957   ALKPHOS 52 09/10/2020 1215   ALKPHOS 57 05/17/2017 0957   BILITOT 0.5 09/10/2020 1215   BILITOT 0.4 07/10/2019 1240   BILITOT 0.47 05/17/2017 0957   GFRNONAA >60 09/10/2020 1215   GFRNONAA >60 07/10/2019 1240   GFRAA >60 02/26/2020 0940   GFRAA >60 07/10/2019 1240    No results found for: SPEP, UPEP  Lab Results  Component Value Date   WBC 3.8 (L) 09/10/2020   NEUTROABS 2.3 09/10/2020   HGB 10.2 (L) 09/10/2020   HCT 31.5 (L) 09/10/2020   MCV 96.9 09/10/2020   PLT 169 09/10/2020      Chemistry      Component Value Date/Time   NA 137 09/10/2020 1215   NA 141 05/17/2017 0957   K 4.5 09/10/2020 1215   K 3.6 05/17/2017 0957   CL 101 09/10/2020 1215   CL 105 11/19/2012 0848   CO2 25 09/10/2020 1215   CO2 25 05/17/2017 0957   BUN 18 09/10/2020 1215   BUN 15.2 05/17/2017 0957   CREATININE 0.80 09/10/2020 1215   CREATININE 0.83 07/10/2019 1240   CREATININE 0.8 05/17/2017 0957   GLU 158 (H) 01/14/2015 1035      Component Value Date/Time   CALCIUM 8.9 09/10/2020 1215   CALCIUM 9.6 05/17/2017 0957   ALKPHOS 52 09/10/2020 1215   ALKPHOS 57 05/17/2017 0957   AST 22 09/10/2020 1215   AST 23 07/10/2019 1240   AST 25 05/17/2017 0957   ALT 19 09/10/2020 1215   ALT 20 07/10/2019 1240   ALT 18 05/17/2017 0957   BILITOT 0.5 09/10/2020 1215   BILITOT 0.4  07/10/2019 1240   BILITOT 0.47 05/17/2017 0957       RADIOGRAPHIC STUDIES: I have reviewed multiple imaging studies with the patient I have personally reviewed the radiological images as listed and agreed with the findings in the report. NM PET Image Restage (PS) Skull Base to Thigh  Result Date: 09/10/2020 CLINICAL DATA:  Subsequent treatment strategy for uterine/cervical cancer. EXAM: NUCLEAR MEDICINE PET SKULL BASE TO THIGH TECHNIQUE: 7.7 mCi F-18 FDG was injected intravenously. Full-ring PET imaging was performed from the skull base to thigh after the radiotracer. CT data was obtained and used for attenuation correction and anatomic localization. Fasting blood glucose: 89 mg/dl COMPARISON:  PET-CT 10/22/2019 and 05/05/2020 FINDINGS: Mediastinal blood pool activity: SUV max 2.4 NECK: There are no residual enlarged or hypermetabolic cervical lymph nodes. Specifically, the small hypermetabolic right supraclavicular nodes on the prior study are  no longer demonstrated. There is asymmetric muscular metabolic activity in this area which is within physiologic limits. A hypermetabolic 1.8 cm right thyroid nodule is again noted with an SUV max of 11.2 (previously 10.4).There are no lesions of the pharyngeal mucosal space. Incidental CT findings: none CHEST: Previously demonstrated hypermetabolic left paraesophageal lymph node just inferior to the left atrium is slightly smaller, with less hypermetabolic activity. This currently measures 7 mm on image 75/4 and has an SUV max of 6.4. Previously 8 mm and SUV max of 8.8. No other residual or new hypermetabolic mediastinal, hilar or axillary lymph nodes. There is diffuse thoracic esophageal hypermetabolic activity which is slightly more prominent than on the prior study. No suspicious pulmonary metabolic activity or nodularity. Incidental CT findings: Stable paramediastinal radiation changes and mild aortic atherosclerosis. Right IJ Port-A-Cath extends to the lower  SVC. Probable calcifications of the aortic valve. ABDOMEN/PELVIS: There is no hypermetabolic activity within the liver, adrenal glands, spleen or pancreas. There is no hypermetabolic nodal activity. Incidental CT findings: No adnexal mass. Mild aortic and branch vessel atherosclerosis. SKELETON: There is no hypermetabolic activity to suggest osseous metastatic disease. Incidental CT findings: Lumbar spine facet arthropathy. IMPRESSION: 1. Interval improvement in previously demonstrated hypermetabolic mediastinal and right supraclavicular adenopathy. A single hypermetabolic left paraesophageal lymph node remains, improved from previous study. The other nodes have resolved. 2. No progressive disease. 3. Diffuse esophageal hypermetabolic activity suggesting esophagitis. 4. Hypermetabolic right thyroid nodule has been previously biopsied. Electronically Signed   By: Richardean Sale M.D.   On: 09/10/2020 10:18

## 2020-09-10 NOTE — Assessment & Plan Note (Signed)
I have reviewed multiple imaging studies with the patient She has excellent response of palliative radiation therapy We will continue Lenvima with Keytruda indefinitely I plan to repeat imaging study in 6 months

## 2020-09-10 NOTE — Assessment & Plan Note (Signed)
She has intermittent elevated blood pressure I refill her prescriptions today I plan to restart checking for proteinuria in the future

## 2020-09-13 ENCOUNTER — Telehealth: Payer: Self-pay | Admitting: Hematology and Oncology

## 2020-09-13 NOTE — Telephone Encounter (Signed)
Left message with follow-up appointment per 4/8 schedule message. Gave option to call back to reschedule if needed.

## 2020-09-14 ENCOUNTER — Other Ambulatory Visit (HOSPITAL_COMMUNITY): Payer: Self-pay

## 2020-09-14 ENCOUNTER — Other Ambulatory Visit: Payer: Self-pay | Admitting: Hematology and Oncology

## 2020-09-14 MED ORDER — SIMVASTATIN 20 MG PO TABS
20.0000 mg | ORAL_TABLET | Freq: Every day | ORAL | 1 refills | Status: DC
  Filled 2020-09-14: qty 90, 90d supply, fill #0
  Filled 2020-12-28: qty 90, 90d supply, fill #1

## 2020-09-14 MED ORDER — AMLODIPINE BESYLATE 10 MG PO TABS
ORAL_TABLET | ORAL | 1 refills | Status: DC
  Filled 2020-09-14: qty 90, 90d supply, fill #0
  Filled 2020-12-15: qty 90, 90d supply, fill #1

## 2020-09-14 MED ORDER — TACROLIMUS 0.1 % EX OINT
TOPICAL_OINTMENT | CUTANEOUS | 1 refills | Status: DC
  Filled 2020-09-14: qty 30, 30d supply, fill #0

## 2020-09-15 ENCOUNTER — Other Ambulatory Visit (HOSPITAL_COMMUNITY): Payer: Self-pay

## 2020-09-15 ENCOUNTER — Ambulatory Visit
Admission: RE | Admit: 2020-09-15 | Discharge: 2020-09-15 | Disposition: A | Discharge status: Home / Self Care | Payer: 59 | Source: Ambulatory Visit | Attending: Family Medicine | Admitting: Family Medicine

## 2020-09-15 ENCOUNTER — Other Ambulatory Visit: Payer: Self-pay

## 2020-09-15 DIAGNOSIS — Z1231 Encounter for screening mammogram for malignant neoplasm of breast: Secondary | ICD-10-CM | POA: Diagnosis not present

## 2020-09-16 ENCOUNTER — Other Ambulatory Visit (HOSPITAL_COMMUNITY): Payer: Self-pay

## 2020-09-28 ENCOUNTER — Other Ambulatory Visit (HOSPITAL_COMMUNITY): Payer: Self-pay

## 2020-09-28 MED FILL — Levothyroxine Sodium Tab 75 MCG: ORAL | 90 days supply | Qty: 90 | Fill #0 | Status: AC

## 2020-09-28 MED FILL — Metoprolol Tartrate Tab 25 MG: ORAL | 90 days supply | Qty: 180 | Fill #0 | Status: AC

## 2020-09-28 MED FILL — Lenvatinib Cap Therapy Pack 10 MG (10 MG Daily Dose): ORAL | 30 days supply | Qty: 30 | Fill #0 | Status: AC

## 2020-09-29 ENCOUNTER — Other Ambulatory Visit (HOSPITAL_COMMUNITY): Payer: Self-pay

## 2020-09-30 ENCOUNTER — Inpatient Hospital Stay: Payer: 59

## 2020-09-30 ENCOUNTER — Other Ambulatory Visit: Payer: Self-pay

## 2020-09-30 VITALS — BP 135/63 | HR 64 | Temp 97.8°F | Resp 18

## 2020-09-30 DIAGNOSIS — K2100 Gastro-esophageal reflux disease with esophagitis, without bleeding: Secondary | ICD-10-CM | POA: Diagnosis not present

## 2020-09-30 DIAGNOSIS — Z79899 Other long term (current) drug therapy: Secondary | ICD-10-CM | POA: Diagnosis not present

## 2020-09-30 DIAGNOSIS — Z923 Personal history of irradiation: Secondary | ICD-10-CM | POA: Diagnosis not present

## 2020-09-30 DIAGNOSIS — E039 Hypothyroidism, unspecified: Secondary | ICD-10-CM

## 2020-09-30 DIAGNOSIS — E041 Nontoxic single thyroid nodule: Secondary | ICD-10-CM | POA: Diagnosis not present

## 2020-09-30 DIAGNOSIS — C541 Malignant neoplasm of endometrium: Secondary | ICD-10-CM

## 2020-09-30 DIAGNOSIS — Z5112 Encounter for antineoplastic immunotherapy: Secondary | ICD-10-CM | POA: Diagnosis not present

## 2020-09-30 DIAGNOSIS — Z7189 Other specified counseling: Secondary | ICD-10-CM

## 2020-09-30 DIAGNOSIS — C770 Secondary and unspecified malignant neoplasm of lymph nodes of head, face and neck: Secondary | ICD-10-CM | POA: Diagnosis not present

## 2020-09-30 DIAGNOSIS — I10 Essential (primary) hypertension: Secondary | ICD-10-CM | POA: Diagnosis not present

## 2020-09-30 DIAGNOSIS — Z95828 Presence of other vascular implants and grafts: Secondary | ICD-10-CM

## 2020-09-30 LAB — COMPREHENSIVE METABOLIC PANEL
ALT: 13 U/L (ref 0–44)
AST: 19 U/L (ref 15–41)
Albumin: 3.6 g/dL (ref 3.5–5.0)
Alkaline Phosphatase: 52 U/L (ref 38–126)
Anion gap: 10 (ref 5–15)
BUN: 16 mg/dL (ref 8–23)
CO2: 27 mmol/L (ref 22–32)
Calcium: 9.2 mg/dL (ref 8.9–10.3)
Chloride: 102 mmol/L (ref 98–111)
Creatinine, Ser: 0.78 mg/dL (ref 0.44–1.00)
GFR, Estimated: >60 mL/min (ref >60)
Glucose, Bld: 85 mg/dL (ref 70–99)
Potassium: 3.8 mmol/L (ref 3.5–5.1)
Sodium: 139 mmol/L (ref 135–145)
Total Bilirubin: 0.3 mg/dL (ref 0.3–1.2)
Total Protein: 6.8 g/dL (ref 6.5–8.1)

## 2020-09-30 LAB — TOTAL PROTEIN, URINE DIPSTICK: Protein, ur: NEGATIVE mg/dL

## 2020-09-30 LAB — CBC WITH DIFFERENTIAL/PLATELET
Abs Immature Granulocytes: 0 10*3/uL (ref 0.00–0.07)
Basophils Absolute: 0 10*3/uL (ref 0.0–0.1)
Basophils Relative: 1 %
Eosinophils Absolute: 0.3 10*3/uL (ref 0.0–0.5)
Eosinophils Relative: 8 %
HCT: 30.2 % — ABNORMAL LOW (ref 36.0–46.0)
Hemoglobin: 9.8 g/dL — ABNORMAL LOW (ref 12.0–15.0)
Immature Granulocytes: 0 %
Lymphocytes Relative: 21 %
Lymphs Abs: 0.7 10*3/uL (ref 0.7–4.0)
MCH: 31.7 pg (ref 26.0–34.0)
MCHC: 32.5 g/dL (ref 30.0–36.0)
MCV: 97.7 fL (ref 80.0–100.0)
Monocytes Absolute: 0.3 10*3/uL (ref 0.1–1.0)
Monocytes Relative: 9 %
Neutro Abs: 1.9 10*3/uL (ref 1.7–7.7)
Neutrophils Relative %: 61 %
Platelets: 159 10*3/uL (ref 150–400)
RBC: 3.09 MIL/uL — ABNORMAL LOW (ref 3.87–5.11)
RDW: 13.2 % (ref 11.5–15.5)
WBC: 3.2 10*3/uL — ABNORMAL LOW (ref 4.0–10.5)
nRBC: 0 % (ref 0.0–0.2)

## 2020-09-30 LAB — TSH: TSH: 3.144 u[IU]/mL (ref 0.308–3.960)

## 2020-09-30 MED ORDER — SODIUM CHLORIDE 0.9% FLUSH
10.0000 mL | INTRAVENOUS | Status: DC | PRN
  Administered 2020-09-30: 10 mL
  Filled 2020-09-30: qty 10

## 2020-09-30 MED ORDER — SODIUM CHLORIDE 0.9 % IV SOLN
Freq: Once | INTRAVENOUS | Status: AC
  Filled 2020-09-30: qty 250

## 2020-09-30 MED ORDER — SODIUM CHLORIDE 0.9% FLUSH
10.0000 mL | Freq: Once | INTRAVENOUS | Status: AC
  Administered 2020-09-30: 10 mL
  Filled 2020-09-30: qty 10

## 2020-09-30 MED ORDER — SODIUM CHLORIDE 0.9 % IV SOLN
200.0000 mg | Freq: Once | INTRAVENOUS | Status: AC
  Administered 2020-09-30: 200 mg via INTRAVENOUS
  Filled 2020-09-30: qty 8

## 2020-09-30 MED ORDER — HEPARIN SOD (PORK) LOCK FLUSH 100 UNIT/ML IV SOLN
500.0000 [IU] | Freq: Once | INTRAVENOUS | Status: AC | PRN
  Administered 2020-09-30: 500 [IU]
  Filled 2020-09-30: qty 5

## 2020-09-30 NOTE — Patient Instructions (Signed)
Implanted Port Insertion, Care After This sheet gives you information about how to care for yourself after your procedure. Your health care provider may also give you more specific instructions. If you have problems or questions, contact your health care provider. What can I expect after the procedure? After the procedure, it is common to have:  Discomfort at the port insertion site.  Bruising on the skin over the port. This should improve over 3-4 days. Follow these instructions at home: Port care  After your port is placed, you will get a manufacturer's information card. The card has information about your port. Keep this card with you at all times.  Take care of the port as told by your health care provider. Ask your health care provider if you or a family member can get training for taking care of the port at home. A home health care nurse may also take care of the port.  Make sure to remember what type of port you have. Incision care  Follow instructions from your health care provider about how to take care of your port insertion site. Make sure you: ? Wash your hands with soap and water before and after you change your bandage (dressing). If soap and water are not available, use hand sanitizer. ? Change your dressing as told by your health care provider. ? Leave stitches (sutures), skin glue, or adhesive strips in place. These skin closures may need to stay in place for 2 weeks or longer. If adhesive strip edges start to loosen and curl up, you may trim the loose edges. Do not remove adhesive strips completely unless your health care provider tells you to do that.  Check your port insertion site every day for signs of infection. Check for: ? Redness, swelling, or pain. ? Fluid or blood. ? Warmth. ? Pus or a bad smell.      Activity  Return to your normal activities as told by your health care provider. Ask your health care provider what activities are safe for you.  Do not  lift anything that is heavier than 10 lb (4.5 kg), or the limit that you are told, until your health care provider says that it is safe. General instructions  Take over-the-counter and prescription medicines only as told by your health care provider.  Do not take baths, swim, or use a hot tub until your health care provider approves. Ask your health care provider if you may take showers. You may only be allowed to take sponge baths.  Do not drive for 24 hours if you were given a sedative during your procedure.  Wear a medical alert bracelet in case of an emergency. This will tell any health care providers that you have a port.  Keep all follow-up visits as told by your health care provider. This is important. Contact a health care provider if:  You cannot flush your port with saline as directed, or you cannot draw blood from the port.  You have a fever or chills.  You have redness, swelling, or pain around your port insertion site.  You have fluid or blood coming from your port insertion site.  Your port insertion site feels warm to the touch.  You have pus or a bad smell coming from the port insertion site. Get help right away if:  You have chest pain or shortness of breath.  You have bleeding from your port that you cannot control. Summary  Take care of the port as told by your   health care provider. Keep the manufacturer's information card with you at all times.  Change your dressing as told by your health care provider.  Contact a health care provider if you have a fever or chills or if you have redness, swelling, or pain around your port insertion site.  Keep all follow-up visits as told by your health care provider. This information is not intended to replace advice given to you by your health care provider. Make sure you discuss any questions you have with your health care provider. Document Revised: 12/18/2017 Document Reviewed: 12/18/2017 [employer] Patient Education   2021 [employer]  

## 2020-09-30 NOTE — Patient Instructions (Signed)
[facility]  Discharge Instructions: ?Thank you for choosing [facility] to provide your oncology and hematology care.  ? ?If you have a lab appointment with the Cancer Center, please go directly to the Cancer Center and check in at the registration area. ?  ?Wear comfortable clothing and clothing appropriate for easy access to any Portacath or PICC line.  ? ?We strive to give you quality time with your provider. You may need to reschedule your appointment if you arrive late (15 or more minutes).  Arriving late affects you and other patients whose appointments are after yours.  Also, if you miss three or more appointments without notifying the office, you may be dismissed from the clinic at the provider?s discretion.    ?  ?For prescription refill requests, have your pharmacy contact our office and allow 72 hours for refills to be completed.   ? ?Today you received the following chemotherapy and/or immunotherapy agents: Keytruda ?  ?To help prevent nausea and vomiting after your treatment, we encourage you to take your nausea medication as directed. ? ?BELOW ARE SYMPTOMS THAT SHOULD BE REPORTED IMMEDIATELY: ?*FEVER GREATER THAN 100.4 F (38 ?C) OR HIGHER ?*CHILLS OR SWEATING ?*NAUSEA AND VOMITING THAT IS NOT CONTROLLED WITH YOUR NAUSEA MEDICATION ?*UNUSUAL SHORTNESS OF BREATH ?*UNUSUAL BRUISING OR BLEEDING ?*URINARY PROBLEMS (pain or burning when urinating, or frequent urination) ?*BOWEL PROBLEMS (unusual diarrhea, constipation, pain near the anus) ?TENDERNESS IN MOUTH AND THROAT WITH OR WITHOUT PRESENCE OF ULCERS (sore throat, sores in mouth, or a toothache) ?UNUSUAL RASH, SWELLING OR PAIN  ?UNUSUAL VAGINAL DISCHARGE OR ITCHING  ? ?Items with * indicate a potential emergency and should be followed up as soon as possible or go to the Emergency Department if any problems should occur. ? ?Please show the CHEMOTHERAPY ALERT CARD or IMMUNOTHERAPY ALERT CARD at check-in to the  Emergency Department and triage nurse. ? ?Should you have questions after your visit or need to cancel or reschedule your appointment, please contact [facility]  Dept: [phone]  and follow the prompts.  Office hours are 8:00 a.m. to 4:30 p.m. Monday - Friday. Please note that voicemails left after 4:00 p.m. may not be returned until the following business day.  We are closed weekends and major holidays. You have access to a nurse at all times for urgent questions. Please call the main number to the clinic Dept: [phone] and follow the prompts. ? ? ?For any non-urgent questions, you may also contact your provider using MyChart. We now offer e-Visits for anyone 18 and older to request care online for non-urgent symptoms. For details visit [email or web address]. ?  ?Also download the MyChart app! Go to the app store, search "MyChart", open the app, select [facility], and log in with your MyChart username and password. ? ?Due to Covid, a mask is required upon entering the hospital/clinic. If you do not have a mask, one will be given to you upon arrival. For doctor visits, patients may have 1 support person aged 18 or older with them. For treatment visits, patients cannot have anyone with them due to current Covid guidelines and our immunocompromised population.  ? ?

## 2020-10-04 ENCOUNTER — Other Ambulatory Visit (HOSPITAL_COMMUNITY): Payer: Self-pay

## 2020-10-04 MED FILL — Losartan Potassium Tab 25 MG: ORAL | 90 days supply | Qty: 90 | Fill #0 | Status: AC

## 2020-10-18 ENCOUNTER — Telehealth: Payer: Self-pay | Admitting: Hematology and Oncology

## 2020-10-18 ENCOUNTER — Telehealth: Payer: Self-pay

## 2020-10-18 NOTE — Telephone Encounter (Signed)
-----   Message from [name], MD sent at 10/18/2020 12:11 PM EDT ----- Regarding: RE: COVID + If she is prescribed Paxlovid then we do not need to refer ----- Message ----- From: Rennis Harding, RN Sent: 10/18/2020  12:04 PM EDT To: [name], MD Subject: RE: COVID +                                    Scheduling request placed.   Patient reports that she was given antiviral Paxlovid 5/9-5/13.  Ok to withhold referral?   ----- Message ----- From: [name], MD Sent: 10/18/2020  11:47 AM EDT To: Rennis Harding, RN Subject: RE: COVID +                                    Yes move her appt Send referral to Covid clinic; she might qualify for antivirals ----- Message ----- From: Rennis Harding, RN Sent: 10/18/2020  11:45 AM EDT To: [name], MD Subject: COVID +                                        Patient called to report she tested positive for Covid.  Today is day 7.    Patient is scheduled for Lab/MD/Infusion on 5/19.   21 days out will be 5/30 - Are you OK for me to cancel upcoming infusion and reschedule due to Covid protocols?

## 2020-10-18 NOTE — Telephone Encounter (Signed)
R/s appts per 5/16 sch msg. Pt aware.  

## 2020-10-21 ENCOUNTER — Inpatient Hospital Stay: Payer: 59

## 2020-10-21 ENCOUNTER — Inpatient Hospital Stay: Payer: 59 | Admitting: Hematology and Oncology

## 2020-10-21 ENCOUNTER — Other Ambulatory Visit: Payer: 59

## 2020-10-26 ENCOUNTER — Other Ambulatory Visit (HOSPITAL_COMMUNITY): Payer: Self-pay

## 2020-10-28 ENCOUNTER — Other Ambulatory Visit (HOSPITAL_COMMUNITY): Payer: Self-pay

## 2020-11-03 ENCOUNTER — Other Ambulatory Visit (HOSPITAL_COMMUNITY): Payer: Self-pay

## 2020-11-03 MED FILL — Pantoprazole Sodium EC Tab 40 MG (Base Equiv): ORAL | 90 days supply | Qty: 90 | Fill #0 | Status: AC

## 2020-11-08 ENCOUNTER — Other Ambulatory Visit (HOSPITAL_COMMUNITY): Payer: Self-pay

## 2020-11-08 ENCOUNTER — Other Ambulatory Visit: Payer: Self-pay

## 2020-11-08 ENCOUNTER — Inpatient Hospital Stay (HOSPITAL_BASED_OUTPATIENT_CLINIC_OR_DEPARTMENT_OTHER): Payer: 59 | Admitting: Hematology and Oncology

## 2020-11-08 ENCOUNTER — Other Ambulatory Visit: Payer: Self-pay | Admitting: Hematology and Oncology

## 2020-11-08 ENCOUNTER — Encounter: Payer: Self-pay | Admitting: Hematology and Oncology

## 2020-11-08 ENCOUNTER — Inpatient Hospital Stay: Payer: 59

## 2020-11-08 ENCOUNTER — Inpatient Hospital Stay: Payer: 59 | Attending: Hematology and Oncology

## 2020-11-08 ENCOUNTER — Telehealth: Payer: Self-pay

## 2020-11-08 DIAGNOSIS — Z9221 Personal history of antineoplastic chemotherapy: Secondary | ICD-10-CM | POA: Diagnosis not present

## 2020-11-08 DIAGNOSIS — E039 Hypothyroidism, unspecified: Secondary | ICD-10-CM

## 2020-11-08 DIAGNOSIS — D539 Nutritional anemia, unspecified: Secondary | ICD-10-CM | POA: Insufficient documentation

## 2020-11-08 DIAGNOSIS — D61818 Other pancytopenia: Secondary | ICD-10-CM | POA: Diagnosis not present

## 2020-11-08 DIAGNOSIS — Z5112 Encounter for antineoplastic immunotherapy: Secondary | ICD-10-CM | POA: Diagnosis not present

## 2020-11-08 DIAGNOSIS — Z90722 Acquired absence of ovaries, bilateral: Secondary | ICD-10-CM | POA: Diagnosis not present

## 2020-11-08 DIAGNOSIS — Z79899 Other long term (current) drug therapy: Secondary | ICD-10-CM | POA: Diagnosis not present

## 2020-11-08 DIAGNOSIS — Z923 Personal history of irradiation: Secondary | ICD-10-CM | POA: Insufficient documentation

## 2020-11-08 DIAGNOSIS — C541 Malignant neoplasm of endometrium: Secondary | ICD-10-CM | POA: Diagnosis not present

## 2020-11-08 DIAGNOSIS — E538 Deficiency of other specified B group vitamins: Secondary | ICD-10-CM | POA: Insufficient documentation

## 2020-11-08 DIAGNOSIS — Z9079 Acquired absence of other genital organ(s): Secondary | ICD-10-CM | POA: Diagnosis not present

## 2020-11-08 DIAGNOSIS — G35 Multiple sclerosis: Secondary | ICD-10-CM | POA: Insufficient documentation

## 2020-11-08 DIAGNOSIS — Z90710 Acquired absence of both cervix and uterus: Secondary | ICD-10-CM | POA: Insufficient documentation

## 2020-11-08 DIAGNOSIS — C770 Secondary and unspecified malignant neoplasm of lymph nodes of head, face and neck: Secondary | ICD-10-CM | POA: Diagnosis not present

## 2020-11-08 DIAGNOSIS — Z7189 Other specified counseling: Secondary | ICD-10-CM

## 2020-11-08 DIAGNOSIS — Z95828 Presence of other vascular implants and grafts: Secondary | ICD-10-CM

## 2020-11-08 LAB — VITAMIN B12: Vitamin B-12: 170 pg/mL — ABNORMAL LOW (ref 180–914)

## 2020-11-08 LAB — TOTAL PROTEIN, URINE DIPSTICK: Protein, ur: NEGATIVE mg/dL

## 2020-11-08 LAB — CBC WITH DIFFERENTIAL/PLATELET
Abs Immature Granulocytes: 0.01 10*3/uL (ref 0.00–0.07)
Basophils Absolute: 0.1 10*3/uL (ref 0.0–0.1)
Basophils Relative: 1 %
Eosinophils Absolute: 0.3 10*3/uL (ref 0.0–0.5)
Eosinophils Relative: 9 %
HCT: 31.2 % — ABNORMAL LOW (ref 36.0–46.0)
Hemoglobin: 10.1 g/dL — ABNORMAL LOW (ref 12.0–15.0)
Immature Granulocytes: 0 %
Lymphocytes Relative: 23 %
Lymphs Abs: 0.9 10*3/uL (ref 0.7–4.0)
MCH: 31.1 pg (ref 26.0–34.0)
MCHC: 32.4 g/dL (ref 30.0–36.0)
MCV: 96 fL (ref 80.0–100.0)
Monocytes Absolute: 0.3 10*3/uL (ref 0.1–1.0)
Monocytes Relative: 8 %
Neutro Abs: 2.3 10*3/uL (ref 1.7–7.7)
Neutrophils Relative %: 59 %
Platelets: 186 10*3/uL (ref 150–400)
RBC: 3.25 MIL/uL — ABNORMAL LOW (ref 3.87–5.11)
RDW: 13.6 % (ref 11.5–15.5)
WBC: 3.8 10*3/uL — ABNORMAL LOW (ref 4.0–10.5)
nRBC: 0 % (ref 0.0–0.2)

## 2020-11-08 LAB — COMPREHENSIVE METABOLIC PANEL
ALT: 15 U/L (ref 0–44)
AST: 19 U/L (ref 15–41)
Albumin: 3.8 g/dL (ref 3.5–5.0)
Alkaline Phosphatase: 53 U/L (ref 38–126)
Anion gap: 10 (ref 5–15)
BUN: 22 mg/dL (ref 8–23)
CO2: 25 mmol/L (ref 22–32)
Calcium: 9.8 mg/dL (ref 8.9–10.3)
Chloride: 102 mmol/L (ref 98–111)
Creatinine, Ser: 0.8 mg/dL (ref 0.44–1.00)
GFR, Estimated: >60 mL/min (ref >60)
Glucose, Bld: 91 mg/dL (ref 70–99)
Potassium: 3.9 mmol/L (ref 3.5–5.1)
Sodium: 137 mmol/L (ref 135–145)
Total Bilirubin: 0.5 mg/dL (ref 0.3–1.2)
Total Protein: 7.2 g/dL (ref 6.5–8.1)

## 2020-11-08 LAB — IRON AND TIBC
Iron: 93 ug/dL (ref 41–142)
Saturation Ratios: 32 % (ref 21–57)
TIBC: 289 ug/dL (ref 236–444)
UIBC: 195 ug/dL (ref 120–384)

## 2020-11-08 LAB — FERRITIN: Ferritin: 150 ng/mL (ref 11–307)

## 2020-11-08 LAB — TSH: TSH: 2.473 u[IU]/mL (ref 0.308–3.960)

## 2020-11-08 MED ORDER — SODIUM CHLORIDE 0.9 % IV SOLN
200.0000 mg | Freq: Once | INTRAVENOUS | Status: AC
  Administered 2020-11-08: 200 mg via INTRAVENOUS
  Filled 2020-11-08: qty 8

## 2020-11-08 MED ORDER — HEPARIN SOD (PORK) LOCK FLUSH 100 UNIT/ML IV SOLN
500.0000 [IU] | Freq: Once | INTRAVENOUS | Status: AC | PRN
  Administered 2020-11-08: 500 [IU]
  Filled 2020-11-08: qty 5

## 2020-11-08 MED ORDER — SODIUM CHLORIDE 0.9% FLUSH
10.0000 mL | INTRAVENOUS | Status: DC | PRN
  Administered 2020-11-08: 10 mL
  Filled 2020-11-08: qty 10

## 2020-11-08 MED ORDER — CYANOCOBALAMIN 1000 MCG/ML IJ SOLN
1000.0000 ug | INTRAMUSCULAR | 11 refills | Status: DC
  Filled 2020-11-08: qty 1, 30d supply, fill #0
  Filled 2020-12-07: qty 1, 30d supply, fill #1
  Filled 2021-03-10: qty 1, 30d supply, fill #2
  Filled 2021-04-07: qty 1, 30d supply, fill #3
  Filled 2021-06-08: qty 1, 30d supply, fill #4
  Filled 2021-08-25: qty 1, 30d supply, fill #5
  Filled 2021-10-18: qty 1, 30d supply, fill #6

## 2020-11-08 MED ORDER — SODIUM CHLORIDE 0.9% FLUSH
10.0000 mL | Freq: Once | INTRAVENOUS | Status: AC
  Administered 2020-11-08: 10 mL
  Filled 2020-11-08: qty 10

## 2020-11-08 MED ORDER — SODIUM CHLORIDE 0.9 % IV SOLN
Freq: Once | INTRAVENOUS | Status: AC
  Filled 2020-11-08: qty 250

## 2020-11-08 NOTE — Patient Instructions (Signed)
[facility]  Discharge Instructions: ?Thank you for choosing [facility] to provide your oncology and hematology care.  ? ?If you have a lab appointment with the Cancer Center, please go directly to the Cancer Center and check in at the registration area. ?  ?Wear comfortable clothing and clothing appropriate for easy access to any Portacath or PICC line.  ? ?We strive to give you quality time with your provider. You may need to reschedule your appointment if you arrive late (15 or more minutes).  Arriving late affects you and other patients whose appointments are after yours.  Also, if you miss three or more appointments without notifying the office, you may be dismissed from the clinic at the provider?s discretion.    ?  ?For prescription refill requests, have your pharmacy contact our office and allow 72 hours for refills to be completed.   ? ?Today you received the following chemotherapy and/or immunotherapy agents: Keytruda ?  ?To help prevent nausea and vomiting after your treatment, we encourage you to take your nausea medication as directed. ? ?BELOW ARE SYMPTOMS THAT SHOULD BE REPORTED IMMEDIATELY: ?*FEVER GREATER THAN 100.4 F (38 ?C) OR HIGHER ?*CHILLS OR SWEATING ?*NAUSEA AND VOMITING THAT IS NOT CONTROLLED WITH YOUR NAUSEA MEDICATION ?*UNUSUAL SHORTNESS OF BREATH ?*UNUSUAL BRUISING OR BLEEDING ?*URINARY PROBLEMS (pain or burning when urinating, or frequent urination) ?*BOWEL PROBLEMS (unusual diarrhea, constipation, pain near the anus) ?TENDERNESS IN MOUTH AND THROAT WITH OR WITHOUT PRESENCE OF ULCERS (sore throat, sores in mouth, or a toothache) ?UNUSUAL RASH, SWELLING OR PAIN  ?UNUSUAL VAGINAL DISCHARGE OR ITCHING  ? ?Items with * indicate a potential emergency and should be followed up as soon as possible or go to the Emergency Department if any problems should occur. ? ?Please show the CHEMOTHERAPY ALERT CARD or IMMUNOTHERAPY ALERT CARD at check-in to the  Emergency Department and triage nurse. ? ?Should you have questions after your visit or need to cancel or reschedule your appointment, please contact [facility]  Dept: [phone]  and follow the prompts.  Office hours are 8:00 a.m. to 4:30 p.m. Monday - Friday. Please note that voicemails left after 4:00 p.m. may not be returned until the following business day.  We are closed weekends and major holidays. You have access to a nurse at all times for urgent questions. Please call the main number to the clinic Dept: [phone] and follow the prompts. ? ? ?For any non-urgent questions, you may also contact your provider using MyChart. We now offer e-Visits for anyone 18 and older to request care online for non-urgent symptoms. For details visit [email or web address]. ?  ?Also download the MyChart app! Go to the app store, search "MyChart", open the app, select , and log in with your MyChart username and password. ? ?Due to Covid, a mask is required upon entering the hospital/clinic. If you do not have a mask, one will be given to you upon arrival. For doctor visits, patients may have 1 support person aged 18 or older with them. For treatment visits, patients cannot have anyone with them due to current Covid guidelines and our immunocompromised population.  ? ?

## 2020-11-08 NOTE — Telephone Encounter (Signed)
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 I recommend monthly; if she is ok, I can send to [facility]

## 2020-11-08 NOTE — Progress Notes (Signed)
[place] OFFICE PROGRESS NOTE  Patient Care Team: [name], MD as PCP - General (Family Medicine) Reynold Bowen, MD as Consulting Physician (Endocrinology)  ASSESSMENT & PLAN:  Endometrial cancer [facility]) Her last imaging showed excellent response of palliative radiation therapy We will continue Lenvima with Keytruda indefinitely I plan to space out the interval between 1 imaging to another I plan to repeat imaging study in October  Acquired hypothyroidism She has intermittent elevated TSH I will adjust her thyroid medicine accordingly  Deficiency anemia She had recent pancytopenia I plan to check iron studies and B12 level   No orders of the defined types were placed in this encounter.   All questions were answered. The patient knows to call the clinic with any problems, questions or concerns. The total time spent in the appointment was 20 minutes encounter with patients including review of chart and various tests results, discussions about plan of care and coordination of care plan   [name], MD 11/08/2020 12:25 PM  INTERVAL HISTORY: Please see below for problem oriented charting. She returns for treatment and follow-up She is doing well No side effects from therapy so far She is fully recovered from recent COVID-19 infection Blood pressure control at home is satisfactory  SUMMARY OF ONCOLOGIC HISTORY: Oncology History Overview Note  [employer] testing done 11-2015 on surgical path from 2014: MS stable TMB low 4 muts/mb ATM [id number] ERBB3 T389K E2H2 rearrangement exon 9 PPP2R1A P179R TP53 I195N    ER- APPROXIMATELY 25-35% STAINING IN NEOPLASTIC CELLS (INTERMEDIATE)  PR- APPROXIMATELY 25-35% STAINING IN NEOPLASTIC CELLS (STRONG)  Repeat biopsy 04/02/17: ER negative, Her 2 negative  Progressed on Doxil   Endometrial cancer ([place])  06/20/2012 Pathology Results   Biopsy positive for papillary serous carcinoma   06/20/2012  Genetic Testing   Foundation One testing done 11-2015 on surgical path from 2014: MS stable TMB low 4 muts/mb ATM [id number] ERBB3 T389K E2H2 rearrangement exon 9 PPP2R1A P179R TP53 I195N    06/20/2012 Initial Diagnosis   Patient presented to PCP with intermittent vaginal bleeding since ~ Oct 2013, endometrial biopsy 06-20-12 with complex endometrial hyperplasia with atypia   06/26/2012 Imaging   Thickened endometrial lining in a postmenopausal patient experiencing vaginal bleeding. In the setting of post-menopausal bleeding, endometrial sampling is indicated to exclude carcinoma. No focal myometrial abnormalities are seen.  Normal left ovary and non-visualized right ovary   07/30/2012 Surgery   Dr. Polly Cobia performed robotic hysterectomy with bilateral salpingo-oophorectomy, bilateral pelvic lymph node dissection and periaortic lymph node dissection. Intraoperatively on frozen section, the patient was noted to have a large endometrial polyp with changes within the polyp consistent for high-grade malignancy, possibly papillary serous carcinoma. There is no obvious extrauterine disease noted.     07/30/2012 Pathology Results   [id number] SUPPLEMENTAL REPORT  THE ENDOMETRIAL CARCINOMA WAS ANALYZED FOR DNA MISMATCH REPAIR PROTEINS.IMMUNOHISTOCHEMICALLY, THE NEOPLASM RETAINED NUCLEAR EXPRESSION OF 4 GENE PRODUCTS, MLH1, MSH2, MSH6, AND PMS2, INVOLVED IN DNA MISMATCH REPAIR.POSITIVE AND NEGATIVE CONTROLS WORKED APPROPRIATELY.  PER REQUEST, AN ER AND PR ARE PERFORMED ON BLOCK 1I.  ER- APPROXIMATELY 25-35% STAINING IN NEOPLASTIC CELLS (INTERMEDIATE)  PR- APPROXIMATELY 25-35% STAINING IN NEOPLASTIC CELLS (STRONG)  ER AND PR PREDOMINANTLY SHOW STAINING IN THE SEROUS COMPONENT.  DIAGNOSIS  1. UTERUS, CERVIX WITH BILATERAL FALLOPIAN TUBES AND OVARIES,  HYSTERECTOMY AND BILATERAL SALPINGO-OOPHORECTOMY:  HIGH GRADE/POORLY DIFFERENTIATED ENDOMETRIAL ADENOCARCINOMA WITH SOLID AND  SEROUS PAPILLARY COMPONENTS.  HISTOPATHOLOGIC TYPE:A VARIETY OF PATTERNS WERE PRESENT, ALL OF WHICH SHOULD  BE CONSIDERED TO BE HIGH GRADE.SEROUS PAPILLARY CARCINOMA WAS SEEN OCCURRING ADJACENT TO SOLID ENDOMETRIAL CARCINOMA WITH MARKED ANAPLASIA AND GIANT CELLS. SIZE:TUMOR MEASURED AT LEAST 4.8 CM IN GREATEST HORIZONTAL DIMENSION.  GRADE: POORLY DIFFERENTIATED OR HIGH GRADE. DEPTH OF INVASION: NO DEFINITE MYOMETRIAL INVOLVEMENT WAS SEEN.THE TUMOR APPEARED CONFINED TO THE POLYP AS WELL AS SURFACE ENDOMETRIUM. WHERE MYOMETRIAL THICKNESS NOT APPLICABLE. SEROSAL INVOLVEMENT: NOT DEMONSTRATED.  ENDOCERVICAL INVOLVEMENT:NOT DEMONSTRATED. RESECTION MARGINS: FREE OF INVOLVEMENT. EXTRAUTERINE EXTENSION:NOT DEMONSTRATED. ANGIOLYMPHATIC INVASION: NOT DEFINITELY SEEN. TOTAL NODES EXAMINED:22.  PELVIC NODES EXAMINED:20.  PELVIC NODES INVOLVED:0.  PARA-AORTIC NODES 2. EXAMINED:  PARA-AORTIC NODES 0. INVOLVED TNM STAGE: T1A N0 MX. AJCC STAGE GROUPING: IA. FIGO STAGE:IA.   08/26/2012 Imaging   No CT evidence for intra-abdominal or pelvic metastatic disease. Trace free pelvic fluid, presumably postoperative although the date of surgery is not documented in the electronic medical record.   08/26/2012 - 12/13/2012 Chemotherapy   She received 6 cycles of carbo/taxol    10/29/2012 - 11/27/2012 Radiation Therapy   May 27, June 5,  June 11, June 19, November 27, 2012: Proximal vagina 30 Gy in 5 fractions     01/17/2013 Imaging   1.  No evidence of recurrent or metastatic disease. 2.  No acute abnormality involving the abdomen or pelvis. 3.  Mild diffuse hepatic steatosis. 4.  Very small supraumbilical midline anterior abdominal wall hernia containing fat, unchanged   01/22/2014 Imaging   No evidence for metastatic or recurrent disease. 2. No bowel obstruction.  Normal appendix. 3. Small fat containing hernia,  stable in appearance. 4. Status post hysterectomy and bilateral oophorectomy   02/19/2014 Imaging   No pulmonary lesions are identified. The abnormality on the chest x-ray is due to asymmetric left-sided sternoclavicular joint degenerative disease   10/12/2014 Imaging   New mild retroperitoneal lymphadenopathy in the left paraaortic region and proximal left common iliac chain, consistent with metastatic disease. No other sites of metastatic disease identified within the abdomen or pelvis.     11/27/2014 - 02/11/2015 Chemotherapy   She received 4 cycles of carbo/taxol    03/01/2015 PET scan   Single hypermetabolic small retroperitoneal lymph node along the aorta. 2. No evidence of metastatic disease otherwise in the abdomen or pelvis. No evidence local recurrence. 3. Intensely hypermetabolic enlarged nodule adjacent to the RIGHT lobe of thyroid gland. This presumably represents the biopsied lesion in clinician report which was found to be benign thyroid tissue.   04/05/2015 - 05/13/2015 Radiation Therapy   She received 50.4 gray in 28 fractions with simultaneous integrated boost to 56 gray   06/17/2015 Imaging   No acute process or evidence of metastatic disease in the abdomen or pelvis. Resolution of previously described retroperitoneal adenopathy. 2.  Possible constipation. 3. Atherosclerosis.   06/17/2015 Tumor Marker   Patient's tumor was tested for the following markers: CA125 Results of the tumor marker test revealed 33   08/12/2015 Tumor Marker   Patient's tumor was tested for the following markers: CA125 Results of the tumor marker test revealed 52   08/31/2015 PET scan   Development of right paratracheal hypermetabolic adenopathy, consistent with nodal metastasis. 2. The previously described isolated abdominal retroperitoneal hypermetabolic node has resolved. 3. Persistent hypermetabolic right thyroid nodule, per report previously biopsied. Correlate with those results. 4.  Possible  constipation.   09/09/2015 -  Anti-estrogen oral therapy   She has been receiving alternative treatment between megace and tamoxifen   09/09/2015 Tumor Marker   Patient's tumor was tested for the  following markers: CA125 Results of the tumor marker test revealed 37.8   09/20/2015 Procedure   Technically successful ultrasound-guided thyroid aspiration biopsy , dominant right nodule   09/20/2015 Pathology Results   THYROID, RIGHT, FINE NEEDLE ASPIRATION (SPECIMEN 1 OF 1 COLLECTED 09/20/15): FINDINGS CONSISTENT WITH BENIGN THYROID NODULE (BETHESDA CATEGORY II).   10/28/2015 Tumor Marker   Patient's tumor was tested for the following markers: CA125 Results of the tumor marker test revealed 53.2   11/04/2015 Imaging   Stable benign right thyroid nodule   12/16/2015 Tumor Marker   Patient's tumor was tested for the following markers: CA125 Results of the tumor marker test revealed 38.1   03/22/2016 PET scan   Interval progression of hypermetabolic right paratracheal lymph node consistent with metastatic involvement. 2. Stable hypermetabolic right thyroid nodule. Reportedly this has been biopsied in the past.   03/23/2016 Tumor Marker   Patient's tumor was tested for the following markers: CA125 Results of the tumor marker test revealed 62.4   04/13/2016 - 06/01/2016 Radiation Therapy   She received 56 Gy to the chest in 28 fractions    05/09/2016 Imaging   No evidence of left lower extremity deep vein thrombosis. No evidence of a superficial thrombosis of the greater and lesser saphenous veins. Positive for thrombus noted in several varicosities of the calf. No evidence of Baker's cyst on the left.   05/17/2016 Tumor Marker   Patient's tumor was tested for the following markers: CA125 Results of the tumor marker test revealed 9   06/29/2016 Tumor Marker   Patient's tumor was tested for the following markers: CA125 Results of the tumor marker test revealed 5.9   08/04/2016 Tumor Marker    Patient's tumor was tested for the following markers: CA125 Results of the tumor marker test revealed 6.0   09/12/2016 PET scan   Complete metabolic response to therapy, with resolution of hypermetabolic mediastinal lymphadenopathy since prior exam. No residual or new metastatic disease identified. Stable hypermetabolic right thyroid lobe nodule, which was previously biopsied on 09/20/2015.   03/13/2017 PET scan   1. Solitary focus of recurrent right paratracheal hypermetabolic activity, with a 1.0 cm right lower paratracheal node having a maximum SUV of 9.0. Appearance compatible with recurrent malignancy. 2. Continued hypermetabolic right thyroid nodule, previously biopsied, presumed benign -correlate with prior biopsy results. 3. Other imaging findings of potential clinical significance: Aortic Atherosclerosis (ICD10-I70.0). Mild cardiomegaly. Prominent stool throughout the colon favors constipation.   04/02/2017 Pathology Results   FINE NEEDLE ASPIRATION, ENDOSCOPIC, (EBUS) 4 R NODE (SPECIMEN 1 OF 2 COLLECTED 04/02/17): MALIGNANT CELLS PRESENT, CONSISTENT WITH CARCINOMA. SEE COMMENT. COMMENT: THE MALIGNANT CELLS ARE POSITIVE FOR P53 AND NEGATIVE FOR ESTROGEN RECEPTOR AND TTF-1. THIS PROFILE IS NON-SPECIFIC, BUT THE P53 POSITIVE STAINING IS SUGGESTIVE OF GYNECOLOGIC PRIMARY.    04/11/2017 Procedure   Successful 8 French right internal jugular vein power port placement with its tip at the SVC/RA junction   04/12/2017 Imaging   Normal LV size with mild LV hypertrophy. EF 55-60%. Normal RV size and systolic function. Aortic valve sclerosis without significant stenosis.   04/18/2017 - 06/14/2017 Chemotherapy   The patient had chemotherapy with Doxil    07/10/2017 Imaging   - Left ventricle: The cavity size was normal. There was mild concentric hypertrophy. Systolic function was normal. The estimated ejection fraction was in the range of 55% to 60%. Wall motion was normal; there were no regional  wall motion abnormalities. Doppler parameters are consistent with abnormal left ventricular relaxation (grade  1 diastolic dysfunction). - Aortic valve: Noncoronary cusp mobility was mildly restricted. - Mitral valve: There was mild regurgitation. - Left atrium: The atrium was mildly dilated. - Atrial septum: No defect or patent foramen ovale was identified.  Impressions:  - Compared to November 2018, global LV longitudinal strain remains normal (has increased)   07/11/2017 PET scan   Increased size and hypermetabolic activity of 3.3 cm right thyroid lobe nodule. Thyroid carcinoma cannot be excluded. Recommend repeat ultrasound guided fine needle aspiration to exclude thyroid carcinoma.  New adjacent hypermetabolic 10 mm right supraclavicular lymph node, suspicious for lymph node metastasis.  Slight increase in size and hypermetabolic activity of solitary right paratracheal lymph node.  No evidence of abdominal or pelvic metastatic disease.   07/18/2017 Procedure   1. Technically successful ultrasound guided fine needle aspiration of indeterminate hypermetabolic right-sided thyroid nodule/mass. 2. Technically successful ultrasound-guided core needle biopsy of hypermetabolic right lower cervical lymph node.   07/18/2017 Pathology Results   THYROID, FINE NEEDLE ASPIRATION RIGHT (SPECIMEN 1 OF 1, COLLECTED ON 07/18/2017): ATYPIA OF UNDETERMINED SIGNIFICANCE OR FOLLICULAR LESION OF UNDETERMINED SIGNIFICANCE (BETHESDA CATEGORY III). SEE COMMENT. COMMENT: THE SPECIMEN CONSISTS OF SMALL AND MEDIUM SIZED GROUPS OF FOLLICULAR EPITHELIAL CELLS WITH MILD CYTOLOGIC ATYPIA INCLUDING NUCLEAR ENLARGEMENT AND HURTHLE CELL CHANGE. SOME GROUPS ARE ARRANGED AS MICROFOLLICLES. THERE IS MINIMAL BACKGROUND COLLOID. BASED ON THESE FEATURES, A FOLLICULAR LESION/NEOPLASM CAN NOT BE ENTIRELY RULED OUT. A SPECIMEN WILL BE SENT FOR AFIRMA TESTING.   07/19/2017 Pathology Results   Lymph node, needle/core biopsy -  METASTATIC PAPILLARY SEROUS CARCINOMA. - SEE COMMENT. Microscopic Comment Dr. Vicente Males has reviewed the case and concurs with this interpretation   10/15/2017 PET scan   No new or progressive disease. No evidence of abdominal or pelvic metastatic disease.  Stable hypermetabolic right thyroid lobe nodule and adjacent right supraclavicular lymph node.  Decreased size and hypermetabolic activity of solitary right paratracheal lymph node.   01/23/2018 PET scan   1. Overall, no significant change from PET-CT of 3 months ago. There is persistent hypermetabolic activity at the right thoracic inlet and within a right paratracheal mediastinal node. The nodes have not significantly changed in size, although the metabolic activity has minimally increased over this interval. It is uncertain how much of the thoracic inlet metabolic activity is attributed to the thyroid nodule versus adjacent cervical lymph nodes, both previously biopsied. 2. No new hypermetabolic activity within the neck, chest, abdomen or pelvis. No evidence of local recurrence in the pelvis. 3. Stable probable radiation changes in the right lung.   04/16/2018 PET scan   1. Interval increase in metabolic activity of the RIGHT supraclavicular node and RIGHT lower paratracheal mediastinal node with minimal change in size. Findings consistent with persistent and mildly progressed metastatic adenopathy. 2. New hypermetabolic small lymph node in the RIGHT lower paratracheal nodal station adjacent to the previously followed node. 3. No evidence of local recurrence in the pelvis or new disease elsewhere.   07/30/2018 PET scan   1. Mild interval progression of right supraclavicular, mediastinal, and right hilar hypermetabolic metastatic disease. There is a new hypermetabolic lymph node in the subcarinal station on today's study. 2. No hypermetabolic disease in the abdomen or pelvis to suggest recurrent/metastases. 3.  Aortic Atherosclerois  (ICD10-170.0)    08/16/2018 -  Chemotherapy   The patient had Lenvima since 3/6 and pembrolizumab since 3/13 for chemotherapy treatment. Michel Santee was held temporarily due to infection and severe hypertension   10/31/2018 PET scan  Partial response to therapy, with decreased hypermetabolic lymphadenopathy in mediastinum and right hilar region.  No significant change in hypermetabolic lymphadenopathy in right inferior neck.  No new or progressive metastatic disease identified. No evidence of recurrent or metastatic carcinoma in the pelvis or abdomen   02/04/2019 PET scan   1. Decrease in metabolic activity of RIGHT supraclavicular node and RIGHT paratracheal lymph nodes. 2. Persistent activity in the RIGHT thyroid nodule unchanged. 3. Diffuse activity through the esophagus is favored esophagitis. No change. 4. Scattered foci of intense metabolic activity associated the bowel without focal lesion on CT favored benign. 5. No evidence of peritoneal metastasis. 6. No evidence of metastatic adenopathy in the abdomen pelvis. 7. No evidence of local pelvic sidewall recurrence.   07/30/2019 PET scan   1. Mildly increased uptake within small nodes along the right sternocleidomastoid without change in size and associated with increased deltoid activity may reflect changes related to right arm injection or recent COVID vaccination, consider clinical correlation and attention on follow-up. 2. Persistent activity in the right thyroid nodule, unchanged from previous exams. 3. New activity in a juxta esophageal lymph node in the chest is suspicious though the node is unchanged with respect to size. Endoscopic correlation could potentially be helpful or close attention on follow-up. 4. Subtle increased FDG uptake along the left mainstem bronchus and adjacent to the aorta may represent a small lymph node. No discrete correlate is demonstrated on today's study.   10/22/2019 PET scan   1. Persistent FDG avid  subcentimeter right supraclavicular and juxta esophageal lymph nodes. The degree of FDG uptake is similar to the previous exam. No new or progressive findings identified. 2. No findings of solid organ metastasis, skeletal metastasis or metastatic disease to the abdomen or pelvis. 3. FDG avid right lobe of thyroid gland nodule is again noted This nodule is not incidental and been worked up previously with 2 biopsies. Please refer to results from the most recent biopsy dated 07/18/2017. (Ref: J Am Coll Radiol. 2015 Feb;12(2): 143-50). 4.  Aortic Atherosclerosis (ICD10-I70.0).   05/05/2020 PET scan   1. Progressive disease, as evidenced by increased hypermetabolism within right supraclavicular and mediastinal nodes. 2. No infradiaphragmatic hypermetabolic metastasis. 3.  Aortic Atherosclerosis (ICD10-I70.0). 4. Hypermetabolic right thyroid nodule has been detailed on prior exams and was biopsied on 07/18/2017   09/10/2020 PET scan   1. Interval improvement in previously demonstrated hypermetabolic mediastinal and right supraclavicular adenopathy. A single hypermetabolic left paraesophageal lymph node remains, improved from previous study. The other nodes have resolved.  2. No progressive disease. 3. Diffuse esophageal hypermetabolic activity suggesting esophagitis. 4. Hypermetabolic right thyroid nodule has been previously biopsied.   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[place])   08/16/2018 -  Chemotherapy   The patient had pembrolizumab (KEYTRUDA) 200 mg in sodium chloride 0.9 % 50 mL chemo infusion, 200 mg, Intravenous, Once, 27 of 28 cycles Administration: 200 mg (08/16/2018), 200 mg (09/06/2018), 200 mg (09/30/2018), 200 mg (10/21/2018), 200 mg (11/11/2018), 200 mg (12/02/2018), 200 mg (12/23/2018), 200 mg (01/13/2019), 200 mg (02/05/2019), 200 mg (02/27/2019), 200 mg (03/20/2019), 200 mg (04/10/2019), 200 mg (05/08/2019), 200 mg  (05/29/2019), 200 mg (06/19/2019), 200 mg (07/10/2019), 200 mg (07/31/2019), 200 mg (08/21/2019), 200 mg (09/11/2019), 200 mg (10/02/2019), 200 mg (10/23/2019), 200 mg (11/13/2019), 200 mg (12/04/2019), 200 mg (12/25/2019), 200 mg (01/15/2020), 200 mg (02/05/2020), 200 mg (02/26/2020)  for chemotherapy treatment.  REVIEW OF SYSTEMS:   Constitutional: Denies fevers, chills or abnormal weight loss Eyes: Denies blurriness of vision Ears, nose, mouth, throat, and face: Denies mucositis or sore throat Respiratory: Denies cough, dyspnea or wheezes Cardiovascular: Denies palpitation, chest discomfort or lower extremity swelling Gastrointestinal:  Denies nausea, heartburn or change in bowel habits Skin: Denies abnormal skin rashes Lymphatics: Denies new lymphadenopathy or easy bruising Neurological:Denies numbness, tingling or new weaknesses Behavioral/Psych: Mood is stable, no new changes  All other systems were reviewed with the patient and are negative.  I have reviewed the past medical history, past surgical history, social history and family history with the patient and they are unchanged from previous note.  ALLERGIES:  has no active allergies.  MEDICATIONS:  Current Outpatient Medications  Medication Sig Dispense Refill  . amLODipine (NORVASC) 10 MG tablet TAKE 1 TABLET BY MOUTH ONCE DAILY. **DECREASE SIMVASTATIN TO 20 MG DAILY WHILE ON AMLODIPINE PER MD** 90 tablet 1  . busPIRone (BUSPAR) 10 MG tablet TAKE 1 TABLET BY MOUTH TWICE DAILY 180 tablet 0  . Calcium Carb-Cholecalciferol 500-600 MG-UNIT TABS Take 1 tablet by mouth daily.     . Cholecalciferol (VITAMIN D3) 2000 units TABS Take 2,000 Units by mouth daily.     . hydrochlorothiazide (MICROZIDE) 12.5 MG capsule Take 1 capsule (12.5 mg total) by mouth daily. 90 capsule 3  . lenvatinib 10 mg daily dose (LENVIMA) capsule TAKE 1 CAPSULE (10 MG TOTAL) BY MOUTH DAILY. 30 each 11  . levothyroxine (SYNTHROID) 75 MCG tablet TAKE 1 TABLET BY MOUTH DAILY  BEFORE BREAKFAST. 90 tablet 2  . lidocaine-prilocaine (EMLA) cream APPLY TO AFFECTED AREA ONCE AS DIRECTED 30 g 3  . LORazepam (ATIVAN) 0.5 MG tablet Take 0.5 mg by mouth 2 (two) times daily as needed.  0  . losartan (COZAAR) 25 MG tablet TAKE 1 TABLET BY MOUTH DAILY 90 tablet 3  . metoprolol tartrate (LOPRESSOR) 25 MG tablet TAKE 1 TABLET BY MOUTH TWICE DAILY 180 tablet 3  . ondansetron (ZOFRAN) 8 MG tablet Take 1 tablet (8 mg total) by mouth every 8 (eight) hours as needed for nausea. 30 tablet 3  . pantoprazole (PROTONIX) 40 MG tablet TAKE 1 TABLET BY MOUTH ONCE DAILY 90 tablet 3  . simvastatin (ZOCOR) 20 MG tablet SMARTSIG:1 Tablet(s) By Mouth Every Evening    . simvastatin (ZOCOR) 20 MG tablet Take 1 tablet (20 mg total) by mouth daily in the evening for 90 days. 90 tablet 1  . tacrolimus (PROTOPIC) 0.1 % ointment Apply 1 application topically daily as needed (rash).     . tacrolimus (PROTOPIC) 0.1 % ointment Apply one application topically twice daily. 30 g 1  . venlafaxine XR (EFFEXOR-XR) 150 MG 24 hr capsule Take 2 tablets by mouth each morning with food 180 capsule 1   No current facility-administered medications for this visit.    PHYSICAL EXAMINATION: ECOG PERFORMANCE STATUS: 0 - Asymptomatic  Vitals:   11/08/20 1217  BP: 115/61  Pulse: 63  Resp: 18  Temp: (!) 97.5 F (36.4 C)  SpO2: 100%   Filed Weights   11/08/20 1217  Weight: 157 lb 12.8 oz (71.6 kg)    GENERAL:alert, no distress and comfortable SKIN: skin color, texture, turgor are normal, no rashes or significant lesions EYES: normal, Conjunctiva are pink and non-injected, sclera clear OROPHARYNX:no exudate, no erythema and lips, buccal mucosa, and tongue normal  NECK: supple, thyroid normal size, non-tender, without nodularity LYMPH:  no palpable lymphadenopathy in the cervical, axillary or  inguinal LUNGS: clear to auscultation and percussion with normal breathing effort HEART: regular rate & rhythm and no  murmurs and no lower extremity edema ABDOMEN:abdomen soft, non-tender and normal bowel sounds Musculoskeletal:no cyanosis of digits and no clubbing  NEURO: alert & oriented x 3 with fluent speech, no focal motor/sensory deficits  LABORATORY DATA:  I have reviewed the data as listed    Component Value Date/Time   NA 139 09/30/2020 0842   NA 141 05/17/2017 0957   K 3.8 09/30/2020 0842   K 3.6 05/17/2017 0957   CL 102 09/30/2020 0842   CL 105 11/19/2012 0848   CO2 27 09/30/2020 0842   CO2 25 05/17/2017 0957   GLUCOSE 85 09/30/2020 0842   GLUCOSE 85 05/17/2017 0957   GLUCOSE 122 (H) 11/19/2012 0848   BUN 16 09/30/2020 0842   BUN 15.2 05/17/2017 0957   CREATININE 0.78 09/30/2020 0842   CREATININE 0.83 07/10/2019 1240   CREATININE 0.8 05/17/2017 0957   CALCIUM 9.2 09/30/2020 0842   CALCIUM 9.6 05/17/2017 0957   PROT 6.8 09/30/2020 0842   PROT 7.0 05/17/2017 0957   ALBUMIN 3.6 09/30/2020 0842   ALBUMIN 3.9 05/17/2017 0957   AST 19 09/30/2020 0842   AST 23 07/10/2019 1240   AST 25 05/17/2017 0957   ALT 13 09/30/2020 0842   ALT 20 07/10/2019 1240   ALT 18 05/17/2017 0957   ALKPHOS 52 09/30/2020 0842   ALKPHOS 57 05/17/2017 0957   BILITOT 0.3 09/30/2020 0842   BILITOT 0.4 07/10/2019 1240   BILITOT 0.47 05/17/2017 0957   GFRNONAA >60 09/30/2020 0842   GFRNONAA >60 07/10/2019 1240   GFRAA >60 02/26/2020 0940   GFRAA >60 07/10/2019 1240    No results found for: SPEP, UPEP  Lab Results  Component Value Date   WBC 3.8 (L) 11/08/2020   NEUTROABS 2.3 11/08/2020   HGB 10.1 (L) 11/08/2020   HCT 31.2 (L) 11/08/2020   MCV 96.0 11/08/2020   PLT 186 11/08/2020      Chemistry      Component Value Date/Time   NA 139 09/30/2020 0842   NA 141 05/17/2017 0957   K 3.8 09/30/2020 0842   K 3.6 05/17/2017 0957   CL 102 09/30/2020 0842   CL 105 11/19/2012 0848   CO2 27 09/30/2020 0842   CO2 25 05/17/2017 0957   BUN 16 09/30/2020 0842   BUN 15.2 05/17/2017 0957   CREATININE  0.78 09/30/2020 0842   CREATININE 0.83 07/10/2019 1240   CREATININE 0.8 05/17/2017 0957   GLU 158 (H) 01/14/2015 1035      Component Value Date/Time   CALCIUM 9.2 09/30/2020 0842   CALCIUM 9.6 05/17/2017 0957   ALKPHOS 52 09/30/2020 0842   ALKPHOS 57 05/17/2017 0957   AST 19 09/30/2020 0842   AST 23 07/10/2019 1240   AST 25 05/17/2017 0957   ALT 13 09/30/2020 0842   ALT 20 07/10/2019 1240   ALT 18 05/17/2017 0957   BILITOT 0.3 09/30/2020 0842   BILITOT 0.4 07/10/2019 1240   BILITOT 0.47 05/17/2017 0957

## 2020-11-08 NOTE — Assessment & Plan Note (Signed)
Her last imaging showed excellent response of palliative radiation therapy We will continue Lenvima with Keytruda indefinitely I plan to space out the interval between 1 imaging to another I plan to repeat imaging study in October

## 2020-11-08 NOTE — Telephone Encounter (Signed)
Called and given below message. She verbalized understanding and is comfortable giving B12 injections.

## 2020-11-08 NOTE — Assessment & Plan Note (Signed)
She had recent pancytopenia I plan to check iron studies and B12 level

## 2020-11-08 NOTE — Assessment & Plan Note (Signed)
She has intermittent elevated TSH I will adjust her thyroid medicine accordingly 

## 2020-11-08 NOTE — Telephone Encounter (Signed)
I sent to [facility]

## 2020-11-09 ENCOUNTER — Other Ambulatory Visit (HOSPITAL_COMMUNITY): Payer: Self-pay

## 2020-11-09 MED FILL — Lenvatinib Cap Therapy Pack 10 MG (10 MG Daily Dose): ORAL | 30 days supply | Qty: 30 | Fill #1 | Status: AC

## 2020-11-15 ENCOUNTER — Other Ambulatory Visit (HOSPITAL_COMMUNITY): Payer: Self-pay

## 2020-11-30 ENCOUNTER — Other Ambulatory Visit (HOSPITAL_COMMUNITY): Payer: Self-pay

## 2020-12-03 ENCOUNTER — Other Ambulatory Visit: Payer: Self-pay

## 2020-12-03 ENCOUNTER — Inpatient Hospital Stay: Payer: 59

## 2020-12-03 ENCOUNTER — Encounter: Payer: Self-pay | Admitting: Hematology and Oncology

## 2020-12-03 ENCOUNTER — Inpatient Hospital Stay: Payer: 59 | Attending: Hematology and Oncology

## 2020-12-03 VITALS — Ht 64.0 in | Wt 162.0 lb

## 2020-12-03 DIAGNOSIS — Z90722 Acquired absence of ovaries, bilateral: Secondary | ICD-10-CM | POA: Insufficient documentation

## 2020-12-03 DIAGNOSIS — G35 Multiple sclerosis: Secondary | ICD-10-CM | POA: Insufficient documentation

## 2020-12-03 DIAGNOSIS — C541 Malignant neoplasm of endometrium: Secondary | ICD-10-CM | POA: Diagnosis not present

## 2020-12-03 DIAGNOSIS — E039 Hypothyroidism, unspecified: Secondary | ICD-10-CM

## 2020-12-03 DIAGNOSIS — I10 Essential (primary) hypertension: Secondary | ICD-10-CM | POA: Insufficient documentation

## 2020-12-03 DIAGNOSIS — Z5112 Encounter for antineoplastic immunotherapy: Secondary | ICD-10-CM | POA: Diagnosis not present

## 2020-12-03 DIAGNOSIS — D6481 Anemia due to antineoplastic chemotherapy: Secondary | ICD-10-CM | POA: Insufficient documentation

## 2020-12-03 DIAGNOSIS — C770 Secondary and unspecified malignant neoplasm of lymph nodes of head, face and neck: Secondary | ICD-10-CM | POA: Diagnosis not present

## 2020-12-03 DIAGNOSIS — Z79899 Other long term (current) drug therapy: Secondary | ICD-10-CM | POA: Insufficient documentation

## 2020-12-03 DIAGNOSIS — Z95828 Presence of other vascular implants and grafts: Secondary | ICD-10-CM

## 2020-12-03 DIAGNOSIS — Z7189 Other specified counseling: Secondary | ICD-10-CM

## 2020-12-03 LAB — CBC WITH DIFFERENTIAL/PLATELET
Abs Immature Granulocytes: 0.01 10*3/uL (ref 0.00–0.07)
Basophils Absolute: 0.1 10*3/uL (ref 0.0–0.1)
Basophils Relative: 1 %
Eosinophils Absolute: 0.3 10*3/uL (ref 0.0–0.5)
Eosinophils Relative: 7 %
HCT: 31.5 % — ABNORMAL LOW (ref 36.0–46.0)
Hemoglobin: 10.2 g/dL — ABNORMAL LOW (ref 12.0–15.0)
Immature Granulocytes: 0 %
Lymphocytes Relative: 22 %
Lymphs Abs: 0.9 10*3/uL (ref 0.7–4.0)
MCH: 31 pg (ref 26.0–34.0)
MCHC: 32.4 g/dL (ref 30.0–36.0)
MCV: 95.7 fL (ref 80.0–100.0)
Monocytes Absolute: 0.3 10*3/uL (ref 0.1–1.0)
Monocytes Relative: 8 %
Neutro Abs: 2.6 10*3/uL (ref 1.7–7.7)
Neutrophils Relative %: 62 %
Platelets: 184 10*3/uL (ref 150–400)
RBC: 3.29 MIL/uL — ABNORMAL LOW (ref 3.87–5.11)
RDW: 14 % (ref 11.5–15.5)
WBC: 4.2 10*3/uL (ref 4.0–10.5)
nRBC: 0 % (ref 0.0–0.2)

## 2020-12-03 LAB — COMPREHENSIVE METABOLIC PANEL
ALT: 16 U/L (ref 0–44)
AST: 20 U/L (ref 15–41)
Albumin: 3.5 g/dL (ref 3.5–5.0)
Alkaline Phosphatase: 57 U/L (ref 38–126)
Anion gap: 10 (ref 5–15)
BUN: 18 mg/dL (ref 8–23)
CO2: 26 mmol/L (ref 22–32)
Calcium: 9.3 mg/dL (ref 8.9–10.3)
Chloride: 98 mmol/L (ref 98–111)
Creatinine, Ser: 0.82 mg/dL (ref 0.44–1.00)
GFR, Estimated: >60 mL/min (ref >60)
Glucose, Bld: 70 mg/dL (ref 70–99)
Potassium: 4.3 mmol/L (ref 3.5–5.1)
Sodium: 134 mmol/L — ABNORMAL LOW (ref 135–145)
Total Bilirubin: 0.5 mg/dL (ref 0.3–1.2)
Total Protein: 7 g/dL (ref 6.5–8.1)

## 2020-12-03 LAB — TOTAL PROTEIN, URINE DIPSTICK: Protein, ur: NEGATIVE mg/dL

## 2020-12-03 LAB — TSH: TSH: 2.061 u[IU]/mL (ref 0.308–3.960)

## 2020-12-03 MED ORDER — SODIUM CHLORIDE 0.9% FLUSH
10.0000 mL | Freq: Once | INTRAVENOUS | Status: AC
  Administered 2020-12-03: 10 mL
  Filled 2020-12-03: qty 10

## 2020-12-03 MED ORDER — SODIUM CHLORIDE 0.9 % IV SOLN
Freq: Once | INTRAVENOUS | Status: AC
Start: 2020-12-03 — End: 2020-12-03
  Filled 2020-12-03: qty 250

## 2020-12-03 MED ORDER — SODIUM CHLORIDE 0.9 % IV SOLN
200.0000 mg | Freq: Once | INTRAVENOUS | Status: AC
  Administered 2020-12-03: 200 mg via INTRAVENOUS
  Filled 2020-12-03: qty 8

## 2020-12-03 MED ORDER — SODIUM CHLORIDE 0.9% FLUSH
10.0000 mL | INTRAVENOUS | Status: DC | PRN
  Administered 2020-12-03: 10 mL
  Filled 2020-12-03: qty 10

## 2020-12-03 MED ORDER — HEPARIN SOD (PORK) LOCK FLUSH 100 UNIT/ML IV SOLN
500.0000 [IU] | Freq: Once | INTRAVENOUS | Status: AC | PRN
  Administered 2020-12-03: 500 [IU]
  Filled 2020-12-03: qty 5

## 2020-12-03 NOTE — Patient Instructions (Signed)
[facility]  ° Discharge Instructions: °Thank you for choosing [facility] to provide your oncology and hematology care.  ° °If you have a lab appointment with the Cancer Center, please go directly to the Cancer Center and check in at the registration area. °  °Wear comfortable clothing and clothing appropriate for easy access to any Portacath or PICC line.  ° °We strive to give you quality time with your provider. You may need to reschedule your appointment if you arrive late (15 or more minutes).  Arriving late affects you and other patients whose appointments are after yours.  Also, if you miss three or more appointments without notifying the office, you may be dismissed from the clinic at the provider’s discretion.    °  °For prescription refill requests, have your pharmacy contact our office and allow 72 hours for refills to be completed.   ° °Today you received the following chemotherapy and/or immunotherapy agents: Pembrolizumab (Keytruda) °  °To help prevent nausea and vomiting after your treatment, we encourage you to take your nausea medication as directed. ° °BELOW ARE SYMPTOMS THAT SHOULD BE REPORTED IMMEDIATELY: °*FEVER GREATER THAN 100.4 F (38 °C) OR HIGHER °*CHILLS OR SWEATING °*NAUSEA AND VOMITING THAT IS NOT CONTROLLED WITH YOUR NAUSEA MEDICATION °*UNUSUAL SHORTNESS OF BREATH °*UNUSUAL BRUISING OR BLEEDING °*URINARY PROBLEMS (pain or burning when urinating, or frequent urination) °*BOWEL PROBLEMS (unusual diarrhea, constipation, pain near the anus) °TENDERNESS IN MOUTH AND THROAT WITH OR WITHOUT PRESENCE OF ULCERS (sore throat, sores in mouth, or a toothache) °UNUSUAL RASH, SWELLING OR PAIN  °UNUSUAL VAGINAL DISCHARGE OR ITCHING  ° °Items with * indicate a potential emergency and should be followed up as soon as possible or go to the Emergency Department if any problems should occur. ° °Please show the CHEMOTHERAPY ALERT CARD or IMMUNOTHERAPY ALERT CARD  at check-in to the Emergency Department and triage nurse. ° °Should you have questions after your visit or need to cancel or reschedule your appointment, please contact [facility]  Dept: [phone]  and follow the prompts.  Office hours are 8:00 a.m. to 4:30 p.m. Monday - Friday. Please note that voicemails left after 4:00 p.m. may not be returned until the following business day.  We are closed weekends and major holidays. You have access to a nurse at all times for urgent questions. Please call the main number to the clinic Dept: [phone] and follow the prompts. ° ° °For any non-urgent questions, you may also contact your provider using MyChart. We now offer e-Visits for anyone 18 and older to request care online for non-urgent symptoms. For details visit [email or web address]. °  °Also download the MyChart app! Go to the app store, search "MyChart", open the app, select [facility], and log in with your MyChart username and password. ° °Due to Covid, a mask is required upon entering the hospital/clinic. If you do not have a mask, one will be given to you upon arrival. For doctor visits, patients may have 1 support person aged 18 or older with them. For treatment visits, patients cannot have anyone with them due to current Covid guidelines and our immunocompromised population.  ° °

## 2020-12-07 ENCOUNTER — Other Ambulatory Visit (HOSPITAL_COMMUNITY): Payer: Self-pay

## 2020-12-08 ENCOUNTER — Other Ambulatory Visit (HOSPITAL_COMMUNITY): Payer: Self-pay

## 2020-12-09 ENCOUNTER — Telehealth: Payer: Self-pay

## 2020-12-09 ENCOUNTER — Other Ambulatory Visit (HOSPITAL_COMMUNITY): Payer: Self-pay

## 2020-12-09 MED ORDER — BUSPIRONE HCL 10 MG PO TABS
10.0000 mg | ORAL_TABLET | Freq: Two times a day (BID) | ORAL | 0 refills | Status: DC
  Filled 2020-12-09: qty 180, 90d supply, fill #0

## 2020-12-09 MED ORDER — VENLAFAXINE HCL ER 150 MG PO CP24
300.0000 mg | ORAL_CAPSULE | Freq: Every morning | ORAL | 0 refills | Status: DC
  Filled 2020-12-09: qty 180, 90d supply, fill #0

## 2020-12-09 MED FILL — Lenvatinib Cap Therapy Pack 10 MG (10 MG Daily Dose): ORAL | 30 days supply | Qty: 30 | Fill #2 | Status: AC

## 2020-12-09 NOTE — Telephone Encounter (Signed)
Oral Oncology Patient Advocate Encounter   Received notification from [place] that prior authorization for Lenvima is required.   PA submitted on CoverMyMeds Key [id number] Status is pending   Oral Oncology Clinic will continue to follow.  [place] [facility] [place] Phone [phone] Fax [phone] 12/09/2020 9:40 AM

## 2020-12-10 ENCOUNTER — Other Ambulatory Visit (HOSPITAL_COMMUNITY): Payer: Self-pay

## 2020-12-10 NOTE — Telephone Encounter (Signed)
Oral Oncology Patient Advocate Encounter  Prior Authorization for [employer] has been approved.    PA# [id number] Effective dates: 12/09/20 through 12/08/21  Oral Oncology Clinic will continue to follow.    [place] [facility] [place] Phone [phone] Fax [phone] 12/10/2020 8:20 AM

## 2020-12-15 ENCOUNTER — Other Ambulatory Visit (HOSPITAL_COMMUNITY): Payer: Self-pay

## 2020-12-16 ENCOUNTER — Other Ambulatory Visit (HOSPITAL_COMMUNITY): Payer: Self-pay

## 2020-12-22 ENCOUNTER — Ambulatory Visit: Payer: 59 | Attending: Internal Medicine

## 2020-12-22 ENCOUNTER — Other Ambulatory Visit: Payer: Self-pay

## 2020-12-22 ENCOUNTER — Other Ambulatory Visit (HOSPITAL_BASED_OUTPATIENT_CLINIC_OR_DEPARTMENT_OTHER): Payer: Self-pay

## 2020-12-22 DIAGNOSIS — Z23 Encounter for immunization: Secondary | ICD-10-CM

## 2020-12-22 MED ORDER — PFIZER-BIONT COVID-19 VAC-TRIS 30 MCG/0.3ML IM SUSP
INTRAMUSCULAR | 0 refills | Status: DC
  Filled 2020-12-22: qty 0.3, 1d supply, fill #0

## 2020-12-22 NOTE — Progress Notes (Signed)
   Covid-19 Vaccination Clinic  Name:  CAMRY ROBELLO    MRN: [id number] DOB: [birth year]  12/22/2020  Ms. Kolenovic was observed post Covid-19 immunization for 15 minutes without incident. She was provided with Vaccine Information Sheet and instruction to access the V-Safe system.   Ms. Pleitez was instructed to call 911 with any severe reactions post vaccine: Difficulty breathing  Swelling of face and throat  A fast heartbeat  A bad rash all over body  Dizziness and weakness   Immunizations Administered     Name Date Dose VIS Date Route   PFIZER Comrnaty(Gray TOP) Covid-19 Vaccine 12/22/2020 10:24 AM 0.3 mL 05/13/2020 Intramuscular   Manufacturer: [employer]   Lot: [id number]   [place]: [id number]

## 2020-12-23 ENCOUNTER — Inpatient Hospital Stay: Payer: 59

## 2020-12-23 ENCOUNTER — Encounter: Payer: Self-pay | Admitting: Hematology and Oncology

## 2020-12-23 ENCOUNTER — Inpatient Hospital Stay (HOSPITAL_BASED_OUTPATIENT_CLINIC_OR_DEPARTMENT_OTHER): Payer: 59 | Admitting: Hematology and Oncology

## 2020-12-23 DIAGNOSIS — C541 Malignant neoplasm of endometrium: Secondary | ICD-10-CM

## 2020-12-23 DIAGNOSIS — T451X5A Adverse effect of antineoplastic and immunosuppressive drugs, initial encounter: Secondary | ICD-10-CM | POA: Diagnosis not present

## 2020-12-23 DIAGNOSIS — Z95828 Presence of other vascular implants and grafts: Secondary | ICD-10-CM

## 2020-12-23 DIAGNOSIS — C770 Secondary and unspecified malignant neoplasm of lymph nodes of head, face and neck: Secondary | ICD-10-CM | POA: Diagnosis not present

## 2020-12-23 DIAGNOSIS — D6481 Anemia due to antineoplastic chemotherapy: Secondary | ICD-10-CM | POA: Diagnosis not present

## 2020-12-23 DIAGNOSIS — Z90722 Acquired absence of ovaries, bilateral: Secondary | ICD-10-CM | POA: Diagnosis not present

## 2020-12-23 DIAGNOSIS — E039 Hypothyroidism, unspecified: Secondary | ICD-10-CM

## 2020-12-23 DIAGNOSIS — Z7189 Other specified counseling: Secondary | ICD-10-CM

## 2020-12-23 DIAGNOSIS — G35 Multiple sclerosis: Secondary | ICD-10-CM | POA: Diagnosis not present

## 2020-12-23 DIAGNOSIS — Z5112 Encounter for antineoplastic immunotherapy: Secondary | ICD-10-CM | POA: Diagnosis not present

## 2020-12-23 DIAGNOSIS — I10 Essential (primary) hypertension: Secondary | ICD-10-CM | POA: Diagnosis not present

## 2020-12-23 DIAGNOSIS — Z79899 Other long term (current) drug therapy: Secondary | ICD-10-CM | POA: Diagnosis not present

## 2020-12-23 LAB — CMP (CANCER CENTER ONLY)
ALT: 20 U/L (ref 0–44)
AST: 27 U/L (ref 15–41)
Albumin: 4 g/dL (ref 3.5–5.0)
Alkaline Phosphatase: 41 U/L (ref 38–126)
Anion gap: 8 (ref 5–15)
BUN: 21 mg/dL (ref 8–23)
CO2: 29 mmol/L (ref 22–32)
Calcium: 9.6 mg/dL (ref 8.9–10.3)
Chloride: 99 mmol/L (ref 98–111)
Creatinine: 0.86 mg/dL (ref 0.44–1.00)
GFR, Estimated: >60 mL/min (ref >60)
Glucose, Bld: 104 mg/dL — ABNORMAL HIGH (ref 70–99)
Potassium: 4.2 mmol/L (ref 3.5–5.1)
Sodium: 136 mmol/L (ref 135–145)
Total Bilirubin: 0.8 mg/dL (ref 0.3–1.2)
Total Protein: 7.2 g/dL (ref 6.5–8.1)

## 2020-12-23 LAB — CBC WITH DIFFERENTIAL/PLATELET
Abs Immature Granulocytes: 0.01 10*3/uL (ref 0.00–0.07)
Basophils Absolute: 0 10*3/uL (ref 0.0–0.1)
Basophils Relative: 1 %
Eosinophils Absolute: 0.3 10*3/uL (ref 0.0–0.5)
Eosinophils Relative: 6 %
HCT: 30.9 % — ABNORMAL LOW (ref 36.0–46.0)
Hemoglobin: 10.4 g/dL — ABNORMAL LOW (ref 12.0–15.0)
Immature Granulocytes: 0 %
Lymphocytes Relative: 13 %
Lymphs Abs: 0.6 10*3/uL — ABNORMAL LOW (ref 0.7–4.0)
MCH: 31.7 pg (ref 26.0–34.0)
MCHC: 33.7 g/dL (ref 30.0–36.0)
MCV: 94.2 fL (ref 80.0–100.0)
Monocytes Absolute: 0.3 10*3/uL (ref 0.1–1.0)
Monocytes Relative: 7 %
Neutro Abs: 3.3 10*3/uL (ref 1.7–7.7)
Neutrophils Relative %: 73 %
Platelets: 164 10*3/uL (ref 150–400)
RBC: 3.28 MIL/uL — ABNORMAL LOW (ref 3.87–5.11)
RDW: 14 % (ref 11.5–15.5)
WBC: 4.5 10*3/uL (ref 4.0–10.5)
nRBC: 0 % (ref 0.0–0.2)

## 2020-12-23 LAB — TSH: TSH: 2.615 u[IU]/mL (ref 0.308–3.960)

## 2020-12-23 MED ORDER — SODIUM CHLORIDE 0.9 % IV SOLN
200.0000 mg | Freq: Once | INTRAVENOUS | Status: AC
  Administered 2020-12-23: 200 mg via INTRAVENOUS
  Filled 2020-12-23: qty 8

## 2020-12-23 MED ORDER — SODIUM CHLORIDE 0.9 % IV SOLN
Freq: Once | INTRAVENOUS | Status: AC
  Filled 2020-12-23: qty 250

## 2020-12-23 MED ORDER — HEPARIN SOD (PORK) LOCK FLUSH 100 UNIT/ML IV SOLN
500.0000 [IU] | Freq: Once | INTRAVENOUS | Status: AC | PRN
Start: 2020-12-23 — End: 2020-12-23
  Administered 2020-12-23: 500 [IU]
  Filled 2020-12-23: qty 5

## 2020-12-23 MED ORDER — SODIUM CHLORIDE 0.9% FLUSH
10.0000 mL | INTRAVENOUS | Status: DC | PRN
  Administered 2020-12-23: 10 mL
  Filled 2020-12-23: qty 10

## 2020-12-23 MED ORDER — SODIUM CHLORIDE 0.9% FLUSH
10.0000 mL | Freq: Once | INTRAVENOUS | Status: AC
  Administered 2020-12-23: 10 mL
  Filled 2020-12-23: qty 10

## 2020-12-23 NOTE — Progress Notes (Signed)
[facility] OFFICE PROGRESS NOTE  Patient Care Team: [name], MD as PCP - General (Family Medicine) Reynold Bowen, MD as Consulting Physician (Endocrinology)  ASSESSMENT & PLAN:  Endometrial cancer [facility]) Her last imaging showed excellent response of palliative radiation therapy We will continue Lenvima with Keytruda indefinitely I plan to space out the interval between one imaging to another I plan to repeat imaging study in October  Anemia due to antineoplastic chemotherapy She had intermittent pancytopenia Recent B12 level in June was low She will receive vitamin B12 injection monthly Plan to recheck end of the year  Acquired hypothyroidism She has intermittent elevated TSH I will adjust her thyroid medicine accordingly  No orders of the defined types were placed in this encounter.   All questions were answered. The patient knows to call the clinic with any problems, questions or concerns. The total time spent in the appointment was 20 minutes encounter with patients including review of chart and various tests results, discussions about plan of care and coordination of care plan   [name], MD 12/23/2020 11:55 AM  INTERVAL HISTORY: Please see below for problem oriented charting. She returns for treatment and follow-up She denies new side effects of treatment She has occasional rare joint pain that comes and goes No new lymphadenopathy She did not see much improvement of energy level since she started on B12 injection  SUMMARY OF ONCOLOGIC HISTORY: Oncology History Overview Note  [facility] testing done 11-2015 on surgical path from 2014:    MS stable   TMB low  4 muts/mb   ATM [id number]   ERBB3 T389K   E2H2 rearrangement exon 9   PPP2R1A P179R   TP53 I195N    ER- APPROXIMATELY 25-35% STAINING IN NEOPLASTIC CELLS (INTERMEDIATE)  PR- APPROXIMATELY 25-35% STAINING IN NEOPLASTIC CELLS (STRONG)  Repeat biopsy 04/02/17: ER negative, Her 2  negative  Progressed on Doxil   Endometrial cancer ([place])  06/20/2012 Pathology Results   Biopsy positive for papillary serous carcinoma    06/20/2012 Genetic Testing   [facility] testing done 11-2015 on surgical path from 2014:    MS stable   TMB low  4 muts/mb   ATM [id number]   ERBB3 T389K   E2H2 rearrangement exon 9   PPP2R1A P179R   TP53 I195N     06/20/2012 Initial Diagnosis   Patient presented to PCP with intermittent vaginal bleeding since ~ Oct 2013, endometrial biopsy 06-20-12 with complex endometrial hyperplasia with atypia    06/26/2012 Imaging   Thickened endometrial lining in a postmenopausal patient experiencing vaginal bleeding. In the setting of post-menopausal bleeding, endometrial sampling is indicated to exclude carcinoma. No focal myometrial abnormalities are seen.  Normal left ovary and non-visualized right ovary    07/30/2012 Surgery   Dr. Polly Cobia performed robotic hysterectomy with bilateral salpingo-oophorectomy, bilateral pelvic lymph node dissection and periaortic lymph node dissection. Intraoperatively on frozen section, the patient was noted to have a large endometrial polyp with changes within the polyp consistent for high-grade malignancy, possibly papillary serous carcinoma. There is no obvious extrauterine disease noted.     07/30/2012 Pathology Results   [phone] SUPPLEMENTAL REPORT  THE ENDOMETRIAL CARCINOMA WAS ANALYZED FOR DNA MISMATCH REPAIR PROTEINS.  IMMUNOHISTOCHEMICALLY, THE NEOPLASM RETAINED NUCLEAR EXPRESSION OF 4 GENE PRODUCTS, MLH1, MSH2, MSH6, AND PMS2, INVOLVED IN DNA MISMATCH REPAIR.  POSITIVE AND NEGATIVE CONTROLS WORKED APPROPRIATELY.  PER REQUEST, AN ER AND PR ARE PERFORMED ON BLOCK 1I.  ER- APPROXIMATELY 25-35% STAINING IN NEOPLASTIC CELLS (  INTERMEDIATE)  PR- APPROXIMATELY 25-35% STAINING IN NEOPLASTIC CELLS (STRONG)  ER AND PR PREDOMINANTLY SHOW STAINING IN THE SEROUS COMPONENT.  DIAGNOSIS  1. UTERUS, CERVIX WITH BILATERAL  FALLOPIAN TUBES AND OVARIES,  HYSTERECTOMY AND BILATERAL SALPINGO-OOPHORECTOMY:  HIGH GRADE/POORLY DIFFERENTIATED ENDOMETRIAL ADENOCARCINOMA WITH SOLID AND SEROUS PAPILLARY COMPONENTS.  HISTOPATHOLOGIC TYPE:    A VARIETY OF PATTERNS WERE PRESENT, ALL OF WHICH SHOULD BE CONSIDERED TO BE HIGH GRADE.  SEROUS PAPILLARY CARCINOMA WAS SEEN OCCURRING ADJACENT TO SOLID ENDOMETRIAL CARCINOMA WITH MARKED ANAPLASIA AND GIANT CELLS. SIZE: TUMOR MEASURED AT LEAST 4.8 CM IN GREATEST HORIZONTAL DIMENSION.  GRADE: POORLY DIFFERENTIATED OR HIGH GRADE. DEPTH OF INVASION: NO DEFINITE MYOMETRIAL INVOLVEMENT WAS SEEN.  THE TUMOR APPEARED CONFINED TO THE POLYP AS WELL AS SURFACE ENDOMETRIUM. WHERE MYOMETRIAL THICKNESS NOT APPLICABLE. SEROSAL INVOLVEMENT:       NOT DEMONSTRATED.  ENDOCERVICAL INVOLVEMENT:  NOT DEMONSTRATED. RESECTION MARGINS:         FREE OF INVOLVEMENT. EXTRAUTERINE EXTENSION:    NOT DEMONSTRATED. ANGIOLYMPHATIC INVASION:   NOT DEFINITELY SEEN. TOTAL NODES EXAMINED:      22.    PELVIC NODES EXAMINED:  20.    PELVIC NODES INVOLVED:  0.    PARA-AORTIC NODES       2. EXAMINED:    PARA-AORTIC NODES       0. INVOLVED TNM STAGE:                 T1A N0 MX. AJCC STAGE GROUPING:       IA. FIGO STAGE:                IA.    08/26/2012 Imaging   No CT evidence for intra-abdominal or pelvic metastatic disease. Trace free pelvic fluid, presumably postoperative although the date of surgery is not documented in the electronic medical record.    08/26/2012 - 12/13/2012 Chemotherapy   She received 6 cycles of carbo/taxol     10/29/2012 - 11/27/2012 Radiation Therapy   May 27, June 5,  June 11, June 19, November 27, 2012: Proximal vagina 30 Gy in 5 fractions      01/17/2013 Imaging   1.  No evidence of recurrent or metastatic disease. 2.  No acute abnormality involving the abdomen or pelvis. 3.  Mild diffuse hepatic steatosis. 4.  Very small supraumbilical midline anterior abdominal wall hernia containing fat,  unchanged    01/22/2014 Imaging   No evidence for metastatic or recurrent disease. 2. No bowel obstruction.  Normal appendix. 3. Small fat containing hernia, stable in appearance. 4. Status post hysterectomy and bilateral oophorectomy    02/19/2014 Imaging   No pulmonary lesions are identified. The abnormality on the chest x-ray is due to asymmetric left-sided sternoclavicular joint degenerative disease    10/12/2014 Imaging   New mild retroperitoneal lymphadenopathy in the left paraaortic region and proximal left common iliac chain, consistent with metastatic disease. No other sites of metastatic disease identified within the abdomen or pelvis.        11/27/2014 - 02/11/2015 Chemotherapy   She received 4 cycles of carbo/taxol     03/01/2015 PET scan   Single hypermetabolic small retroperitoneal lymph node along the aorta. 2. No evidence of metastatic disease otherwise in the abdomen or pelvis. No evidence local recurrence. 3. Intensely hypermetabolic enlarged nodule adjacent to the RIGHT lobe of thyroid gland. This presumably represents the biopsied lesion in clinician report which was found to be benign thyroid tissue.    04/05/2015 - 05/13/2015 Radiation Therapy  She received 50.4 gray in 28 fractions with simultaneous integrated boost to 56 gray    06/17/2015 Imaging   No acute process or evidence of metastatic disease in the abdomen or pelvis. Resolution of previously described retroperitoneal adenopathy. 2.  Possible constipation. 3. Atherosclerosis.    06/17/2015 Tumor Marker   Patient's tumor was tested for the following markers: CA125 Results of the tumor marker test revealed 33    08/12/2015 Tumor Marker   Patient's tumor was tested for the following markers: CA125 Results of the tumor marker test revealed 52    08/31/2015 PET scan   Development of right paratracheal hypermetabolic adenopathy, consistent with nodal metastasis. 2. The previously described isolated abdominal  retroperitoneal hypermetabolic node has resolved. 3. Persistent hypermetabolic right thyroid nodule, per report previously biopsied. Correlate with those results. 4.  Possible constipation.    09/09/2015 -  Anti-estrogen oral therapy   She has been receiving alternative treatment between megace and tamoxifen    09/09/2015 Tumor Marker   Patient's tumor was tested for the following markers: CA125 Results of the tumor marker test revealed 37.8    09/20/2015 Procedure   Technically successful ultrasound-guided thyroid aspiration biopsy , dominant right nodule    09/20/2015 Pathology Results   THYROID, RIGHT, FINE NEEDLE ASPIRATION (SPECIMEN 1 OF 1 COLLECTED 09/20/15): FINDINGS CONSISTENT WITH BENIGN THYROID NODULE (BETHESDA CATEGORY II).    10/28/2015 Tumor Marker   Patient's tumor was tested for the following markers: CA125 Results of the tumor marker test revealed 53.2    11/04/2015 Imaging   Stable benign right thyroid nodule    12/16/2015 Tumor Marker   Patient's tumor was tested for the following markers: CA125 Results of the tumor marker test revealed 38.1    03/22/2016 PET scan   Interval progression of hypermetabolic right paratracheal lymph node consistent with metastatic involvement. 2. Stable hypermetabolic right thyroid nodule. Reportedly this has been biopsied in the past.    03/23/2016 Tumor Marker   Patient's tumor was tested for the following markers: CA125 Results of the tumor marker test revealed 62.4    04/13/2016 - 06/01/2016 Radiation Therapy   She received 56 Gy to the chest in 28 fractions    05/09/2016 Imaging   No evidence of left lower extremity deep vein thrombosis. No evidence of a superficial thrombosis of the greater and lesser saphenous veins. Positive for thrombus noted in several varicosities of the calf. No evidence of Baker's cyst on the left.    05/17/2016 Tumor Marker   Patient's tumor was tested for the following markers: CA125 Results of  the tumor marker test revealed 9    06/29/2016 Tumor Marker   Patient's tumor was tested for the following markers: CA125 Results of the tumor marker test revealed 5.9    08/04/2016 Tumor Marker   Patient's tumor was tested for the following markers: CA125 Results of the tumor marker test revealed 6.0    09/12/2016 PET scan   Complete metabolic response to therapy, with resolution of hypermetabolic mediastinal lymphadenopathy since prior exam. No residual or new metastatic disease identified. Stable hypermetabolic right thyroid lobe nodule, which was previously biopsied on 09/20/2015.    03/13/2017 PET scan   1. Solitary focus of recurrent right paratracheal hypermetabolic activity, with a 1.0 cm right lower paratracheal node having a maximum SUV of 9.0. Appearance compatible with recurrent malignancy. 2. Continued hypermetabolic right thyroid nodule, previously biopsied, presumed benign -correlate with prior biopsy results. 3. Other imaging findings of potential clinical significance: Aortic  Atherosclerosis (ICD10-I70.0). Mild cardiomegaly. Prominent stool throughout the colon favors constipation.    04/02/2017 Pathology Results   FINE NEEDLE ASPIRATION, ENDOSCOPIC, (EBUS) 4 R NODE (SPECIMEN 1 OF 2 COLLECTED 04/02/17): MALIGNANT CELLS PRESENT, CONSISTENT WITH CARCINOMA. SEE COMMENT. COMMENT: THE MALIGNANT CELLS ARE POSITIVE FOR P53 AND NEGATIVE FOR ESTROGEN RECEPTOR AND TTF-1. THIS PROFILE IS NON-SPECIFIC, BUT THE P53 POSITIVE STAINING IS SUGGESTIVE OF GYNECOLOGIC PRIMARY.     04/11/2017 Procedure   Successful 8 French right internal jugular vein power port placement with its tip at the SVC/RA junction    04/12/2017 Imaging   Normal LV size with mild LV hypertrophy. EF 55-60%. Normal RV size and systolic function. Aortic valve sclerosis without significant stenosis.    04/18/2017 - 06/14/2017 Chemotherapy   The patient had chemotherapy with Doxil     07/10/2017 Imaging   - Left  ventricle: The cavity size was normal. There was mild concentric hypertrophy. Systolic function was normal. The estimated ejection fraction was in the range of 55% to 60%. Wall motion was normal; there were no regional wall motion abnormalities. Doppler parameters are consistent with abnormal left ventricular relaxation (grade 1 diastolic dysfunction). - Aortic valve: Noncoronary cusp mobility was mildly restricted. - Mitral valve: There was mild regurgitation. - Left atrium: The atrium was mildly dilated. - Atrial septum: No defect or patent foramen ovale was identified.  Impressions:  - Compared to November 2018, global LV longitudinal strain remains normal (has increased)    07/11/2017 PET scan   Increased size and hypermetabolic activity of 3.3 cm right thyroid lobe nodule. Thyroid carcinoma cannot be excluded. Recommend repeat ultrasound guided fine needle aspiration to exclude thyroid carcinoma.  New adjacent hypermetabolic 10 mm right supraclavicular lymph node, suspicious for lymph node metastasis.  Slight increase in size and hypermetabolic activity of solitary right paratracheal lymph node.  No evidence of abdominal or pelvic metastatic disease.    07/18/2017 Procedure   1. Technically successful ultrasound guided fine needle aspiration of indeterminate hypermetabolic right-sided thyroid nodule/mass. 2. Technically successful ultrasound-guided core needle biopsy of hypermetabolic right lower cervical lymph node.    07/18/2017 Pathology Results   THYROID, FINE NEEDLE ASPIRATION RIGHT (SPECIMEN 1 OF 1, COLLECTED ON 07/18/2017): ATYPIA OF UNDETERMINED SIGNIFICANCE OR FOLLICULAR LESION OF UNDETERMINED SIGNIFICANCE (BETHESDA CATEGORY III). SEE COMMENT. COMMENT: THE SPECIMEN CONSISTS OF SMALL AND MEDIUM SIZED GROUPS OF FOLLICULAR EPITHELIAL CELLS WITH MILD CYTOLOGIC ATYPIA INCLUDING NUCLEAR ENLARGEMENT AND HURTHLE CELL CHANGE. SOME GROUPS ARE ARRANGED AS MICROFOLLICLES. THERE IS MINIMAL  BACKGROUND COLLOID. BASED ON THESE FEATURES, A FOLLICULAR LESION/NEOPLASM CAN NOT BE ENTIRELY RULED OUT. A SPECIMEN WILL BE SENT FOR AFIRMA TESTING.    07/19/2017 Pathology Results   Lymph node, needle/core biopsy - METASTATIC PAPILLARY SEROUS CARCINOMA. - SEE COMMENT. Microscopic Comment Dr. Vicente Males has reviewed the case and concurs with this interpretation    10/15/2017 PET scan   No new or progressive disease. No evidence of abdominal or pelvic metastatic disease.  Stable hypermetabolic right thyroid lobe nodule and adjacent right supraclavicular lymph node.  Decreased size and hypermetabolic activity of solitary right paratracheal lymph node.    01/23/2018 PET scan   1. Overall, no significant change from PET-CT of 3 months ago. There is persistent hypermetabolic activity at the right thoracic inlet and within a right paratracheal mediastinal node. The nodes have not significantly changed in size, although the metabolic activity has minimally increased over this interval. It is uncertain how much of the thoracic inlet metabolic activity is attributed  to the thyroid nodule versus adjacent cervical lymph nodes, both previously biopsied. 2. No new hypermetabolic activity within the neck, chest, abdomen or pelvis. No evidence of local recurrence in the pelvis. 3. Stable probable radiation changes in the right lung.    04/16/2018 PET scan   1. Interval increase in metabolic activity of the RIGHT supraclavicular node and RIGHT lower paratracheal mediastinal node with minimal change in size. Findings consistent with persistent and mildly progressed metastatic adenopathy. 2. New hypermetabolic small lymph node in the RIGHT lower paratracheal nodal station adjacent to the previously followed node. 3. No evidence of local recurrence in the pelvis or new disease elsewhere.    07/30/2018 PET scan   1. Mild interval progression of right supraclavicular, mediastinal, and right hilar  hypermetabolic metastatic disease. There is a new hypermetabolic lymph node in the subcarinal station on today's study. 2. No hypermetabolic disease in the abdomen or pelvis to suggest recurrent/metastases. 3.  Aortic Atherosclerois (ICD10-170.0)      08/16/2018 -  Chemotherapy   The patient had Lenvima since 3/6 and pembrolizumab since 3/13 for chemotherapy treatment. Michel Santee was held temporarily due to infection and severe hypertension    10/31/2018 PET scan   Partial response to therapy, with decreased hypermetabolic lymphadenopathy in mediastinum and right hilar region.   No significant change in hypermetabolic lymphadenopathy in right inferior neck.   No new or progressive metastatic disease identified. No evidence of recurrent or metastatic carcinoma in the pelvis or abdomen    02/04/2019 PET scan   1. Decrease in metabolic activity of RIGHT supraclavicular node and RIGHT paratracheal lymph nodes. 2. Persistent activity in the RIGHT thyroid nodule unchanged. 3. Diffuse activity through the esophagus is favored esophagitis. No change. 4. Scattered foci of intense metabolic activity associated the bowel without focal lesion on CT favored benign. 5. No evidence of peritoneal metastasis. 6. No evidence of metastatic adenopathy in the abdomen pelvis. 7. No evidence of local pelvic sidewall recurrence.   07/30/2019 PET scan   1. Mildly increased uptake within small nodes along the right sternocleidomastoid without change in size and associated with increased deltoid activity may reflect changes related to right arm injection or recent COVID vaccination, consider clinical correlation and attention on follow-up. 2. Persistent activity in the right thyroid nodule, unchanged from previous exams. 3. New activity in a juxta esophageal lymph node in the chest is suspicious though the node is unchanged with respect to size. Endoscopic correlation could potentially be helpful or close attention on  follow-up. 4. Subtle increased FDG uptake along the left mainstem bronchus and adjacent to the aorta may represent a small lymph node. No discrete correlate is demonstrated on today's study.   10/22/2019 PET scan   1. Persistent FDG avid subcentimeter right supraclavicular and juxta esophageal lymph nodes. The degree of FDG uptake is similar to the previous exam. No new or progressive findings identified. 2. No findings of solid organ metastasis, skeletal metastasis or metastatic disease to the abdomen or pelvis. 3. FDG avid right lobe of thyroid gland nodule is again noted This nodule is not incidental and been worked up previously with 2 biopsies. Please refer to results from the most recent biopsy dated 07/18/2017. (Ref: J Am Coll Radiol. 2015 Feb;12(2): 143-50). 4.  Aortic Atherosclerosis (ICD10-I70.0).   05/05/2020 PET scan   1. Progressive disease, as evidenced by increased hypermetabolism within right supraclavicular and mediastinal nodes. 2. No infradiaphragmatic hypermetabolic metastasis. 3.  Aortic Atherosclerosis (ICD10-I70.0). 4. Hypermetabolic right thyroid nodule has  been detailed on prior exams and was biopsied on 07/18/2017   09/10/2020 PET scan   1. Interval improvement in previously demonstrated hypermetabolic mediastinal and right supraclavicular adenopathy. A single hypermetabolic left paraesophageal lymph node remains, improved from previous study. The other nodes have resolved.  2. No progressive disease. 3. Diffuse esophageal hypermetabolic activity suggesting esophagitis. 4. Hypermetabolic right thyroid nodule has been previously biopsied.   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 Lenvima was held temporarily due to infection and severe hypertension      REVIEW  OF SYSTEMS:   Constitutional: Denies fevers, chills or abnormal weight loss Eyes: Denies blurriness of vision Ears, nose, mouth, throat, and face: Denies mucositis or sore throat Respiratory: Denies cough, dyspnea or wheezes Cardiovascular: Denies palpitation, chest discomfort or lower extremity swelling Gastrointestinal:  Denies nausea, heartburn or change in bowel habits Skin: Denies abnormal skin rashes Lymphatics: Denies new lymphadenopathy or easy bruising Neurological:Denies numbness, tingling or new weaknesses Behavioral/Psych: Mood is stable, no new changes  All other systems were reviewed with the patient and are negative.  I have reviewed the past medical history, past surgical history, social history and family history with the patient and they are unchanged from previous note.  ALLERGIES:  has no active allergies.  MEDICATIONS:  Current Outpatient Medications  Medication Sig Dispense Refill   amLODipine (NORVASC) 10 MG tablet TAKE 1 TABLET BY MOUTH ONCE DAILY. **DECREASE SIMVASTATIN TO 20 MG DAILY WHILE ON AMLODIPINE PER MD** 90 tablet 1   busPIRone (BUSPAR) 10 MG tablet TAKE 1 TABLET BY MOUTH TWICE DAILY 180 tablet 0   busPIRone (BUSPAR) 10 MG tablet Take 1 tablet (10 mg total) by mouth 2 (two) times daily. 180 tablet 0   Calcium Carb-Cholecalciferol 500-600 MG-UNIT TABS Take 1 tablet by mouth daily.      Cholecalciferol (VITAMIN D3) 2000 units TABS Take 2,000 Units by mouth daily.      COVID-19 mRNA Vac-TriS, Pfizer, (PFIZER-BIONT COVID-19 VAC-TRIS) SUSP injection Inject into the muscle. 0.3 mL 0   cyanocobalamin (,VITAMIN B-12,) 1000 MCG/ML injection Inject 1 mL (1,000 mcg total) into the muscle every 30 (thirty) days. 1 mL 11   hydrochlorothiazide (MICROZIDE) 12.5 MG capsule Take 1 capsule (12.5 mg total) by mouth daily. 90 capsule 3   lenvatinib 10 mg daily dose (LENVIMA) capsule TAKE 1 CAPSULE (10 MG TOTAL) BY MOUTH DAILY. 30 each 11   levothyroxine (SYNTHROID) 75 MCG  tablet TAKE 1 TABLET BY MOUTH DAILY BEFORE BREAKFAST. 90 tablet 2   lidocaine-prilocaine (EMLA) cream APPLY TO AFFECTED AREA ONCE AS DIRECTED 30 g 3   LORazepam (ATIVAN) 0.5 MG tablet Take 0.5 mg by mouth 2 (two) times daily as needed.  0   losartan (COZAAR) 25 MG tablet TAKE 1 TABLET BY MOUTH DAILY 90 tablet 3   metoprolol tartrate (LOPRESSOR) 25 MG tablet TAKE 1 TABLET BY MOUTH TWICE DAILY 180 tablet 3   ondansetron (ZOFRAN) 8 MG tablet Take 1 tablet (8 mg total) by mouth every 8 (eight) hours as needed for nausea. 30 tablet 3   pantoprazole (PROTONIX) 40 MG tablet TAKE 1 TABLET BY MOUTH ONCE DAILY 90 tablet 3   simvastatin (ZOCOR) 20 MG tablet SMARTSIG:1 Tablet(s) By Mouth Every Evening     simvastatin (ZOCOR) 20 MG tablet Take 1 tablet (20 mg  total) by mouth daily in the evening for 90 days. 90 tablet 1   tacrolimus (PROTOPIC) 0.1 % ointment Apply 1 application topically daily as needed (rash).      tacrolimus (PROTOPIC) 0.1 % ointment Apply one application topically twice daily. 30 g 1   venlafaxine XR (EFFEXOR XR) 150 MG 24 hr capsule Take 2 capsules (300 mg total) by mouth every morning with food 180 capsule 0   venlafaxine XR (EFFEXOR-XR) 150 MG 24 hr capsule Take 2 tablets by mouth each morning with food 180 capsule 1   No current facility-administered medications for this visit.    PHYSICAL EXAMINATION: ECOG PERFORMANCE STATUS: 1 - Symptomatic but completely ambulatory  Vitals:   12/23/20 1149  BP: (!) 126/91  Pulse: 75  Resp: 18  Temp: 97.9 F (36.6 C)  SpO2: 99%   Filed Weights   12/23/20 1149  Weight: 159 lb 9.6 oz (72.4 kg)    GENERAL:alert, no distress and comfortable SKIN: skin color, texture, turgor are normal, no rashes or significant lesions EYES: normal, Conjunctiva are pink and non-injected, sclera clear OROPHARYNX:no exudate, no erythema and lips, buccal mucosa, and tongue normal  NECK: supple, thyroid normal size, non-tender, without nodularity LYMPH:  no  palpable lymphadenopathy in the cervical, axillary or inguinal LUNGS: clear to auscultation and percussion with normal breathing effort HEART: regular rate & rhythm and no murmurs and no lower extremity edema ABDOMEN:abdomen soft, non-tender and normal bowel sounds Musculoskeletal:no cyanosis of digits and no clubbing  NEURO: alert & oriented x 3 with fluent speech, no focal motor/sensory deficits  LABORATORY DATA:  I have reviewed the data as listed    Component Value Date/Time   NA 134 (L) 12/03/2020 1224   NA 141 05/17/2017 0957   K 4.3 12/03/2020 1224   K 3.6 05/17/2017 0957   CL 98 12/03/2020 1224   CL 105 11/19/2012 0848   CO2 26 12/03/2020 1224   CO2 25 05/17/2017 0957   GLUCOSE 70 12/03/2020 1224   GLUCOSE 85 05/17/2017 0957   GLUCOSE 122 (H) 11/19/2012 0848   BUN 18 12/03/2020 1224   BUN 15.2 05/17/2017 0957   CREATININE 0.82 12/03/2020 1224   CREATININE 0.83 07/10/2019 1240   CREATININE 0.8 05/17/2017 0957   CALCIUM 9.3 12/03/2020 1224   CALCIUM 9.6 05/17/2017 0957   PROT 7.0 12/03/2020 1224   PROT 7.0 05/17/2017 0957   ALBUMIN 3.5 12/03/2020 1224   ALBUMIN 3.9 05/17/2017 0957   AST 20 12/03/2020 1224   AST 23 07/10/2019 1240   AST 25 05/17/2017 0957   ALT 16 12/03/2020 1224   ALT 20 07/10/2019 1240   ALT 18 05/17/2017 0957   ALKPHOS 57 12/03/2020 1224   ALKPHOS 57 05/17/2017 0957   BILITOT 0.5 12/03/2020 1224   BILITOT 0.4 07/10/2019 1240   BILITOT 0.47 05/17/2017 0957   GFRNONAA >60 12/03/2020 1224   GFRNONAA >60 07/10/2019 1240   GFRAA >60 02/26/2020 0940   GFRAA >60 07/10/2019 1240    No results found for: SPEP, UPEP  Lab Results  Component Value Date   WBC 4.5 12/23/2020   NEUTROABS 3.3 12/23/2020   HGB 10.4 (L) 12/23/2020   HCT 30.9 (L) 12/23/2020   MCV 94.2 12/23/2020   PLT 164 12/23/2020      Chemistry      Component Value Date/Time   NA 134 (L) 12/03/2020 1224   NA 141 05/17/2017 0957   K 4.3 12/03/2020 1224   K 3.6 05/17/2017  0957  CL 98 12/03/2020 1224   CL 105 11/19/2012 0848   CO2 26 12/03/2020 1224   CO2 25 05/17/2017 0957   BUN 18 12/03/2020 1224   BUN 15.2 05/17/2017 0957   CREATININE 0.82 12/03/2020 1224   CREATININE 0.83 07/10/2019 1240   CREATININE 0.8 05/17/2017 0957   GLU 158 (H) 01/14/2015 1035      Component Value Date/Time   CALCIUM 9.3 12/03/2020 1224   CALCIUM 9.6 05/17/2017 0957   ALKPHOS 57 12/03/2020 1224   ALKPHOS 57 05/17/2017 0957   AST 20 12/03/2020 1224   AST 23 07/10/2019 1240   AST 25 05/17/2017 0957   ALT 16 12/03/2020 1224   ALT 20 07/10/2019 1240   ALT 18 05/17/2017 0957   BILITOT 0.5 12/03/2020 1224   BILITOT 0.4 07/10/2019 1240   BILITOT 0.47 05/17/2017 0957

## 2020-12-23 NOTE — Assessment & Plan Note (Signed)
Her last imaging showed excellent response of palliative radiation therapy We will continue Lenvima with Keytruda indefinitely I plan to space out the interval between one imaging to another I plan to repeat imaging study in October

## 2020-12-23 NOTE — Assessment & Plan Note (Addendum)
She had intermittent pancytopenia Recent B12 level in June was low She will receive vitamin B12 injection monthly Plan to recheck end of the year

## 2020-12-23 NOTE — Assessment & Plan Note (Signed)
She has intermittent elevated TSH I will adjust her thyroid medicine accordingly 

## 2020-12-23 NOTE — Patient Instructions (Signed)
[facility]  ° Discharge Instructions: °Thank you for choosing [facility] to provide your oncology and hematology care.  ° °If you have a lab appointment with the Cancer Center, please go directly to the Cancer Center and check in at the registration area. °  °Wear comfortable clothing and clothing appropriate for easy access to any Portacath or PICC line.  ° °We strive to give you quality time with your provider. You may need to reschedule your appointment if you arrive late (15 or more minutes).  Arriving late affects you and other patients whose appointments are after yours.  Also, if you miss three or more appointments without notifying the office, you may be dismissed from the clinic at the provider’s discretion.    °  °For prescription refill requests, have your pharmacy contact our office and allow 72 hours for refills to be completed.   ° °Today you received the following chemotherapy and/or immunotherapy agents: Pembrolizumab (Keytruda) °  °To help prevent nausea and vomiting after your treatment, we encourage you to take your nausea medication as directed. ° °BELOW ARE SYMPTOMS THAT SHOULD BE REPORTED IMMEDIATELY: °*FEVER GREATER THAN 100.4 F (38 °C) OR HIGHER °*CHILLS OR SWEATING °*NAUSEA AND VOMITING THAT IS NOT CONTROLLED WITH YOUR NAUSEA MEDICATION °*UNUSUAL SHORTNESS OF BREATH °*UNUSUAL BRUISING OR BLEEDING °*URINARY PROBLEMS (pain or burning when urinating, or frequent urination) °*BOWEL PROBLEMS (unusual diarrhea, constipation, pain near the anus) °TENDERNESS IN MOUTH AND THROAT WITH OR WITHOUT PRESENCE OF ULCERS (sore throat, sores in mouth, or a toothache) °UNUSUAL RASH, SWELLING OR PAIN  °UNUSUAL VAGINAL DISCHARGE OR ITCHING  ° °Items with * indicate a potential emergency and should be followed up as soon as possible or go to the Emergency Department if any problems should occur. ° °Please show the CHEMOTHERAPY ALERT CARD or IMMUNOTHERAPY ALERT CARD  at check-in to the Emergency Department and triage nurse. ° °Should you have questions after your visit or need to cancel or reschedule your appointment, please contact [facility]  Dept: [phone]  and follow the prompts.  Office hours are 8:00 a.m. to 4:30 p.m. Monday - Friday. Please note that voicemails left after 4:00 p.m. may not be returned until the following business day.  We are closed weekends and major holidays. You have access to a nurse at all times for urgent questions. Please call the main number to the clinic Dept: [phone] and follow the prompts. ° ° °For any non-urgent questions, you may also contact your provider using MyChart. We now offer e-Visits for anyone 18 and older to request care online for non-urgent symptoms. For details visit [email or web address]. °  °Also download the MyChart app! Go to the app store, search "MyChart", open the app, select [facility], and log in with your MyChart username and password. ° °Due to Covid, a mask is required upon entering the hospital/clinic. If you do not have a mask, one will be given to you upon arrival. For doctor visits, patients may have 1 support person aged 18 or older with them. For treatment visits, patients cannot have anyone with them due to current Covid guidelines and our immunocompromised population.  ° °

## 2020-12-28 ENCOUNTER — Other Ambulatory Visit: Payer: Self-pay | Admitting: Hematology and Oncology

## 2020-12-28 ENCOUNTER — Other Ambulatory Visit (HOSPITAL_COMMUNITY): Payer: Self-pay

## 2020-12-28 MED ORDER — METOPROLOL TARTRATE 25 MG PO TABS
ORAL_TABLET | Freq: Two times a day (BID) | ORAL | 3 refills | Status: DC
  Filled 2020-12-28: qty 180, 90d supply, fill #0
  Filled 2021-04-07: qty 180, 90d supply, fill #1
  Filled 2021-07-14: qty 180, 90d supply, fill #2
  Filled 2021-10-11: qty 180, 90d supply, fill #3

## 2020-12-28 MED FILL — Losartan Potassium Tab 25 MG: ORAL | 90 days supply | Qty: 90 | Fill #1 | Status: AC

## 2020-12-28 MED FILL — Levothyroxine Sodium Tab 75 MCG: ORAL | 90 days supply | Qty: 90 | Fill #1 | Status: AC

## 2020-12-31 ENCOUNTER — Ambulatory Visit
Admission: RE | Admit: 2020-12-31 | Discharge: 2020-12-31 | Disposition: A | Discharge status: Home / Self Care | Payer: 59 | Source: Ambulatory Visit | Attending: Family Medicine | Admitting: Family Medicine

## 2020-12-31 ENCOUNTER — Other Ambulatory Visit: Payer: Self-pay

## 2020-12-31 DIAGNOSIS — M8589 Other specified disorders of bone density and structure, multiple sites: Secondary | ICD-10-CM | POA: Diagnosis not present

## 2020-12-31 DIAGNOSIS — M8580 Other specified disorders of bone density and structure, unspecified site: Secondary | ICD-10-CM

## 2020-12-31 DIAGNOSIS — Z780 Asymptomatic menopausal state: Secondary | ICD-10-CM | POA: Diagnosis not present

## 2021-01-04 ENCOUNTER — Other Ambulatory Visit (HOSPITAL_COMMUNITY): Payer: Self-pay

## 2021-01-05 ENCOUNTER — Other Ambulatory Visit (HOSPITAL_COMMUNITY): Payer: Self-pay

## 2021-01-05 MED FILL — Lenvatinib Cap Therapy Pack 10 MG (10 MG Daily Dose): ORAL | 30 days supply | Qty: 30 | Fill #3 | Status: AC

## 2021-01-06 ENCOUNTER — Other Ambulatory Visit (HOSPITAL_COMMUNITY): Payer: Self-pay

## 2021-01-12 ENCOUNTER — Other Ambulatory Visit: Payer: Self-pay

## 2021-01-12 ENCOUNTER — Inpatient Hospital Stay: Payer: 59 | Attending: Hematology and Oncology

## 2021-01-12 ENCOUNTER — Inpatient Hospital Stay: Payer: 59

## 2021-01-12 ENCOUNTER — Other Ambulatory Visit (HOSPITAL_COMMUNITY): Payer: Self-pay

## 2021-01-12 VITALS — BP 147/72 | HR 66 | Temp 98.1°F | Resp 18 | Wt 159.6 lb

## 2021-01-12 DIAGNOSIS — Z5112 Encounter for antineoplastic immunotherapy: Secondary | ICD-10-CM | POA: Diagnosis not present

## 2021-01-12 DIAGNOSIS — C770 Secondary and unspecified malignant neoplasm of lymph nodes of head, face and neck: Secondary | ICD-10-CM | POA: Diagnosis not present

## 2021-01-12 DIAGNOSIS — Z95828 Presence of other vascular implants and grafts: Secondary | ICD-10-CM

## 2021-01-12 DIAGNOSIS — E039 Hypothyroidism, unspecified: Secondary | ICD-10-CM

## 2021-01-12 DIAGNOSIS — C541 Malignant neoplasm of endometrium: Secondary | ICD-10-CM | POA: Insufficient documentation

## 2021-01-12 DIAGNOSIS — Z7189 Other specified counseling: Secondary | ICD-10-CM

## 2021-01-12 LAB — CBC WITH DIFFERENTIAL/PLATELET
Abs Immature Granulocytes: 0 10*3/uL (ref 0.00–0.07)
Basophils Absolute: 0.1 10*3/uL (ref 0.0–0.1)
Basophils Relative: 1 %
Eosinophils Absolute: 0.4 10*3/uL (ref 0.0–0.5)
Eosinophils Relative: 9 %
HCT: 31.1 % — ABNORMAL LOW (ref 36.0–46.0)
Hemoglobin: 10.2 g/dL — ABNORMAL LOW (ref 12.0–15.0)
Immature Granulocytes: 0 %
Lymphocytes Relative: 19 %
Lymphs Abs: 0.7 10*3/uL (ref 0.7–4.0)
MCH: 31.7 pg (ref 26.0–34.0)
MCHC: 32.8 g/dL (ref 30.0–36.0)
MCV: 96.6 fL (ref 80.0–100.0)
Monocytes Absolute: 0.3 10*3/uL (ref 0.1–1.0)
Monocytes Relative: 7 %
Neutro Abs: 2.6 10*3/uL (ref 1.7–7.7)
Neutrophils Relative %: 64 %
Platelets: 195 10*3/uL (ref 150–400)
RBC: 3.22 MIL/uL — ABNORMAL LOW (ref 3.87–5.11)
RDW: 13.8 % (ref 11.5–15.5)
WBC: 4 10*3/uL (ref 4.0–10.5)
nRBC: 0 % (ref 0.0–0.2)

## 2021-01-12 LAB — COMPREHENSIVE METABOLIC PANEL
ALT: 15 U/L (ref 0–44)
AST: 19 U/L (ref 15–41)
Albumin: 3.7 g/dL (ref 3.5–5.0)
Alkaline Phosphatase: 49 U/L (ref 38–126)
Anion gap: 10 (ref 5–15)
BUN: 21 mg/dL (ref 8–23)
CO2: 25 mmol/L (ref 22–32)
Calcium: 9.9 mg/dL (ref 8.9–10.3)
Chloride: 104 mmol/L (ref 98–111)
Creatinine, Ser: 0.84 mg/dL (ref 0.44–1.00)
GFR, Estimated: >60 mL/min (ref >60)
Glucose, Bld: 107 mg/dL — ABNORMAL HIGH (ref 70–99)
Potassium: 4 mmol/L (ref 3.5–5.1)
Sodium: 139 mmol/L (ref 135–145)
Total Bilirubin: 0.4 mg/dL (ref 0.3–1.2)
Total Protein: 7.2 g/dL (ref 6.5–8.1)

## 2021-01-12 LAB — TSH: TSH: 3.121 u[IU]/mL (ref 0.308–3.960)

## 2021-01-12 MED ORDER — HEPARIN SOD (PORK) LOCK FLUSH 100 UNIT/ML IV SOLN
500.0000 [IU] | Freq: Once | INTRAVENOUS | Status: AC | PRN
  Administered 2021-01-12: 500 [IU]
  Filled 2021-01-12: qty 5

## 2021-01-12 MED ORDER — SODIUM CHLORIDE 0.9% FLUSH
10.0000 mL | INTRAVENOUS | Status: DC | PRN
  Administered 2021-01-12: 10 mL
  Filled 2021-01-12: qty 10

## 2021-01-12 MED ORDER — SODIUM CHLORIDE 0.9 % IV SOLN
Freq: Once | INTRAVENOUS | Status: AC
  Filled 2021-01-12: qty 250

## 2021-01-12 MED ORDER — SODIUM CHLORIDE 0.9% FLUSH
10.0000 mL | Freq: Once | INTRAVENOUS | Status: AC
  Administered 2021-01-12: 10 mL
  Filled 2021-01-12: qty 10

## 2021-01-12 MED ORDER — SODIUM CHLORIDE 0.9 % IV SOLN
200.0000 mg | Freq: Once | INTRAVENOUS | Status: AC
  Administered 2021-01-12: 200 mg via INTRAVENOUS
  Filled 2021-01-12: qty 8

## 2021-01-12 NOTE — Patient Instructions (Signed)
[facility]  Discharge Instructions: ?Thank you for choosing [facility] to provide your oncology and hematology care.  ? ?If you have a lab appointment with the Cancer Center, please go directly to the Cancer Center and check in at the registration area. ?  ?Wear comfortable clothing and clothing appropriate for easy access to any Portacath or PICC line.  ? ?We strive to give you quality time with your provider. You may need to reschedule your appointment if you arrive late (15 or more minutes).  Arriving late affects you and other patients whose appointments are after yours.  Also, if you miss three or more appointments without notifying the office, you may be dismissed from the clinic at the provider?s discretion.    ?  ?For prescription refill requests, have your pharmacy contact our office and allow 72 hours for refills to be completed.   ? ?Today you received the following chemotherapy and/or immunotherapy agents: Keytruda ?  ?To help prevent nausea and vomiting after your treatment, we encourage you to take your nausea medication as directed. ? ?BELOW ARE SYMPTOMS THAT SHOULD BE REPORTED IMMEDIATELY: ?*FEVER GREATER THAN 100.4 F (38 ?C) OR HIGHER ?*CHILLS OR SWEATING ?*NAUSEA AND VOMITING THAT IS NOT CONTROLLED WITH YOUR NAUSEA MEDICATION ?*UNUSUAL SHORTNESS OF BREATH ?*UNUSUAL BRUISING OR BLEEDING ?*URINARY PROBLEMS (pain or burning when urinating, or frequent urination) ?*BOWEL PROBLEMS (unusual diarrhea, constipation, pain near the anus) ?TENDERNESS IN MOUTH AND THROAT WITH OR WITHOUT PRESENCE OF ULCERS (sore throat, sores in mouth, or a toothache) ?UNUSUAL RASH, SWELLING OR PAIN  ?UNUSUAL VAGINAL DISCHARGE OR ITCHING  ? ?Items with * indicate a potential emergency and should be followed up as soon as possible or go to the Emergency Department if any problems should occur. ? ?Please show the CHEMOTHERAPY ALERT CARD or IMMUNOTHERAPY ALERT CARD at check-in to the  Emergency Department and triage nurse. ? ?Should you have questions after your visit or need to cancel or reschedule your appointment, please contact [facility]  Dept: [phone]  and follow the prompts.  Office hours are 8:00 a.m. to 4:30 p.m. Monday - Friday. Please note that voicemails left after 4:00 p.m. may not be returned until the following business day.  We are closed weekends and major holidays. You have access to a nurse at all times for urgent questions. Please call the main number to the clinic Dept: [phone] and follow the prompts. ? ? ?For any non-urgent questions, you may also contact your provider using MyChart. We now offer e-Visits for anyone 18 and older to request care online for non-urgent symptoms. For details visit [email or web address]. ?  ?Also download the MyChart app! Go to the app store, search "MyChart", open the app, select , and log in with your MyChart username and password. ? ?Due to Covid, a mask is required upon entering the hospital/clinic. If you do not have a mask, one will be given to you upon arrival. For doctor visits, patients may have 1 support person aged 18 or older with them. For treatment visits, patients cannot have anyone with them due to current Covid guidelines and our immunocompromised population.  ? ?

## 2021-01-13 ENCOUNTER — Other Ambulatory Visit: Payer: 59

## 2021-01-13 ENCOUNTER — Ambulatory Visit: Payer: 59

## 2021-02-03 ENCOUNTER — Other Ambulatory Visit (HOSPITAL_COMMUNITY): Payer: Self-pay

## 2021-02-03 MED FILL — Pantoprazole Sodium EC Tab 40 MG (Base Equiv): ORAL | 90 days supply | Qty: 90 | Fill #1 | Status: AC

## 2021-02-03 MED FILL — Lenvatinib Cap Therapy Pack 10 MG (10 MG Daily Dose): ORAL | 30 days supply | Qty: 30 | Fill #4 | Status: AC

## 2021-02-04 ENCOUNTER — Inpatient Hospital Stay: Payer: 59

## 2021-02-04 ENCOUNTER — Inpatient Hospital Stay (HOSPITAL_BASED_OUTPATIENT_CLINIC_OR_DEPARTMENT_OTHER): Payer: 59 | Admitting: Hematology and Oncology

## 2021-02-04 ENCOUNTER — Inpatient Hospital Stay: Payer: 59 | Attending: Hematology and Oncology

## 2021-02-04 ENCOUNTER — Encounter: Payer: Self-pay | Admitting: Hematology and Oncology

## 2021-02-04 ENCOUNTER — Other Ambulatory Visit: Payer: Self-pay

## 2021-02-04 VITALS — BP 120/72 | HR 64 | Temp 100.0°F | Resp 18 | Ht 64.0 in | Wt 158.0 lb

## 2021-02-04 DIAGNOSIS — Z95828 Presence of other vascular implants and grafts: Secondary | ICD-10-CM

## 2021-02-04 DIAGNOSIS — D539 Nutritional anemia, unspecified: Secondary | ICD-10-CM

## 2021-02-04 DIAGNOSIS — C541 Malignant neoplasm of endometrium: Secondary | ICD-10-CM

## 2021-02-04 DIAGNOSIS — E039 Hypothyroidism, unspecified: Secondary | ICD-10-CM | POA: Diagnosis not present

## 2021-02-04 DIAGNOSIS — Z7189 Other specified counseling: Secondary | ICD-10-CM

## 2021-02-04 DIAGNOSIS — C770 Secondary and unspecified malignant neoplasm of lymph nodes of head, face and neck: Secondary | ICD-10-CM

## 2021-02-04 DIAGNOSIS — Z5112 Encounter for antineoplastic immunotherapy: Secondary | ICD-10-CM | POA: Insufficient documentation

## 2021-02-04 LAB — CBC WITH DIFFERENTIAL/PLATELET
Abs Immature Granulocytes: 0.01 10*3/uL (ref 0.00–0.07)
Basophils Absolute: 0 10*3/uL (ref 0.0–0.1)
Basophils Relative: 1 %
Eosinophils Absolute: 0.3 10*3/uL (ref 0.0–0.5)
Eosinophils Relative: 9 %
HCT: 30.9 % — ABNORMAL LOW (ref 36.0–46.0)
Hemoglobin: 10.1 g/dL — ABNORMAL LOW (ref 12.0–15.0)
Immature Granulocytes: 0 %
Lymphocytes Relative: 19 %
Lymphs Abs: 0.6 10*3/uL — ABNORMAL LOW (ref 0.7–4.0)
MCH: 31.3 pg (ref 26.0–34.0)
MCHC: 32.7 g/dL (ref 30.0–36.0)
MCV: 95.7 fL (ref 80.0–100.0)
Monocytes Absolute: 0.3 10*3/uL (ref 0.1–1.0)
Monocytes Relative: 8 %
Neutro Abs: 2.1 10*3/uL (ref 1.7–7.7)
Neutrophils Relative %: 63 %
Platelets: 147 10*3/uL — ABNORMAL LOW (ref 150–400)
RBC: 3.23 MIL/uL — ABNORMAL LOW (ref 3.87–5.11)
RDW: 13.4 % (ref 11.5–15.5)
WBC: 3.2 10*3/uL — ABNORMAL LOW (ref 4.0–10.5)
nRBC: 0 % (ref 0.0–0.2)

## 2021-02-04 LAB — COMPREHENSIVE METABOLIC PANEL
ALT: 17 U/L (ref 0–44)
AST: 21 U/L (ref 15–41)
Albumin: 3.7 g/dL (ref 3.5–5.0)
Alkaline Phosphatase: 49 U/L (ref 38–126)
Anion gap: 10 (ref 5–15)
BUN: 20 mg/dL (ref 8–23)
CO2: 27 mmol/L (ref 22–32)
Calcium: 9.6 mg/dL (ref 8.9–10.3)
Chloride: 102 mmol/L (ref 98–111)
Creatinine, Ser: 0.8 mg/dL (ref 0.44–1.00)
GFR, Estimated: >60 mL/min (ref >60)
Glucose, Bld: 102 mg/dL — ABNORMAL HIGH (ref 70–99)
Potassium: 4.1 mmol/L (ref 3.5–5.1)
Sodium: 139 mmol/L (ref 135–145)
Total Bilirubin: 0.5 mg/dL (ref 0.3–1.2)
Total Protein: 7 g/dL (ref 6.5–8.1)

## 2021-02-04 LAB — TOTAL PROTEIN, URINE DIPSTICK: Protein, ur: NEGATIVE mg/dL

## 2021-02-04 LAB — TSH: TSH: 2.978 u[IU]/mL (ref 0.308–3.960)

## 2021-02-04 MED ORDER — SODIUM CHLORIDE 0.9% FLUSH
10.0000 mL | Freq: Once | INTRAVENOUS | Status: AC
  Administered 2021-02-04: 10 mL

## 2021-02-04 MED ORDER — HEPARIN SOD (PORK) LOCK FLUSH 100 UNIT/ML IV SOLN
500.0000 [IU] | Freq: Once | INTRAVENOUS | Status: AC | PRN
  Administered 2021-02-04: 500 [IU]

## 2021-02-04 MED ORDER — SODIUM CHLORIDE 0.9% FLUSH
10.0000 mL | INTRAVENOUS | Status: DC | PRN
  Administered 2021-02-04: 10 mL

## 2021-02-04 MED ORDER — SODIUM CHLORIDE 0.9 % IV SOLN: Freq: Once | INTRAVENOUS | Status: AC

## 2021-02-04 MED ORDER — SODIUM CHLORIDE 0.9 % IV SOLN
200.0000 mg | Freq: Once | INTRAVENOUS | Status: AC
  Administered 2021-02-04: 200 mg via INTRAVENOUS
  Filled 2021-02-04: qty 8

## 2021-02-04 NOTE — Progress Notes (Signed)
[place] OFFICE PROGRESS NOTE  Patient Care Team: [name], MD as PCP - General (Family Medicine) Reynold Bowen, MD as Consulting Physician (Endocrinology)  ASSESSMENT & PLAN:  Endometrial cancer [facility]) Her last imaging showed excellent response of palliative radiation therapy We will continue Lenvima with Keytruda indefinitely I plan to space out the interval between one imaging to another I plan to repeat imaging study in October  Deficiency anemia She had intermittent pancytopenia Recent B12 level in June was low She will receive vitamin B12 injection monthly Plan to recheck end of the year  Acquired hypothyroidism She has intermittent elevated TSH I will adjust her thyroid medicine accordingly  Orders Placed This Encounter  Procedures   NM PET Image Restage (PS) Skull Base to Thigh (F-18 FDG)    Standing Status:   Future    Standing Expiration Date:   02/04/2022    Order Specific Question:   If indicated for the ordered procedure, I authorize the administration of a radiopharmaceutical per Radiology protocol    Answer:   Yes    Order Specific Question:   Preferred imaging location?    Answer:   [facility]    Order Specific Question:   Radiology Contrast Protocol - do NOT remove file path    Answer:   [email or web address]    All questions were answered. The patient knows to call the clinic with any problems, questions or concerns. The total time spent in the appointment was 20 minutes encounter with patients including review of chart and various tests results, discussions about plan of care and coordination of care plan   [name], MD 02/04/2021 11:16 AM  INTERVAL HISTORY: Please see below for problem oriented charting. she returns for treatment follow-up on treatment with Keytruda/Lenvima combination She tolerated treatment well Documented blood pressure from home was satisfactory No new  lymphadenopathy near her neck No recent infection, fever or chills  REVIEW OF SYSTEMS:   Constitutional: Denies fevers, chills or abnormal weight loss Eyes: Denies blurriness of vision Ears, nose, mouth, throat, and face: Denies mucositis or sore throat Respiratory: Denies cough, dyspnea or wheezes Cardiovascular: Denies palpitation, chest discomfort or lower extremity swelling Gastrointestinal:  Denies nausea, heartburn or change in bowel habits Skin: Denies abnormal skin rashes Lymphatics: Denies new lymphadenopathy or easy bruising Neurological:Denies numbness, tingling or new weaknesses Behavioral/Psych: Mood is stable, no new changes  All other systems were reviewed with the patient and are negative.  I have reviewed the past medical history, past surgical history, social history and family history with the patient and they are unchanged from previous note.  ALLERGIES:  has no active allergies.  MEDICATIONS:  Current Outpatient Medications  Medication Sig Dispense Refill   amLODipine (NORVASC) 10 MG tablet TAKE 1 TABLET BY MOUTH ONCE DAILY. **DECREASE SIMVASTATIN TO 20 MG DAILY WHILE ON AMLODIPINE PER MD** 90 tablet 1   busPIRone (BUSPAR) 10 MG tablet TAKE 1 TABLET BY MOUTH TWICE DAILY 180 tablet 0   busPIRone (BUSPAR) 10 MG tablet Take 1 tablet (10 mg total) by mouth 2 (two) times daily. 180 tablet 0   Calcium Carb-Cholecalciferol 500-600 MG-UNIT TABS Take 1 tablet by mouth daily.      Cholecalciferol (VITAMIN D3) 2000 units TABS Take 2,000 Units by mouth daily.      COVID-19 mRNA Vac-TriS, Pfizer, (PFIZER-BIONT COVID-19 VAC-TRIS) SUSP injection Inject into the muscle. 0.3 mL 0   cyanocobalamin (,VITAMIN B-12,) 1000 MCG/ML injection Inject 1 mL (1,000 mcg total) into the  muscle every 30 (thirty) days. 1 mL 11   hydrochlorothiazide (MICROZIDE) 12.5 MG capsule Take 1 capsule (12.5 mg total) by mouth daily. 90 capsule 3   lenvatinib 10 mg daily dose (LENVIMA) capsule TAKE 1 CAPSULE  (10 MG TOTAL) BY MOUTH DAILY. 30 each 11   levothyroxine (SYNTHROID) 75 MCG tablet TAKE 1 TABLET BY MOUTH DAILY BEFORE BREAKFAST. 90 tablet 2   lidocaine-prilocaine (EMLA) cream APPLY TO AFFECTED AREA ONCE AS DIRECTED 30 g 3   LORazepam (ATIVAN) 0.5 MG tablet Take 0.5 mg by mouth 2 (two) times daily as needed.  0   losartan (COZAAR) 25 MG tablet TAKE 1 TABLET BY MOUTH DAILY 90 tablet 3   metoprolol tartrate (LOPRESSOR) 25 MG tablet TAKE 1 TABLET BY MOUTH TWICE DAILY 180 tablet 3   ondansetron (ZOFRAN) 8 MG tablet Take 1 tablet (8 mg total) by mouth every 8 (eight) hours as needed for nausea. 30 tablet 3   pantoprazole (PROTONIX) 40 MG tablet TAKE 1 TABLET BY MOUTH ONCE DAILY 90 tablet 3   simvastatin (ZOCOR) 20 MG tablet SMARTSIG:1 Tablet(s) By Mouth Every Evening     simvastatin (ZOCOR) 20 MG tablet Take 1 tablet (20 mg total) by mouth daily in the evening for 90 days. 90 tablet 1   tacrolimus (PROTOPIC) 0.1 % ointment Apply 1 application topically daily as needed (rash).      tacrolimus (PROTOPIC) 0.1 % ointment Apply one application topically twice daily. 30 g 1   venlafaxine XR (EFFEXOR XR) 150 MG 24 hr capsule Take 2 capsules (300 mg total) by mouth every morning with food 180 capsule 0   venlafaxine XR (EFFEXOR-XR) 150 MG 24 hr capsule Take 2 capsules by mouth each morning with food. 180 capsule 1   No current facility-administered medications for this visit.    SUMMARY OF ONCOLOGIC HISTORY: Oncology History Overview Note  [facility] testing done 11-2015 on surgical path from 2014:    MS stable   TMB low  4 muts/mb   ATM [id number]   ERBB3 T389K   E2H2 rearrangement exon 9   PPP2R1A P179R   TP53 I195N    ER- APPROXIMATELY 25-35% STAINING IN NEOPLASTIC CELLS (INTERMEDIATE)  PR- APPROXIMATELY 25-35% STAINING IN NEOPLASTIC CELLS (STRONG)  Repeat biopsy 04/02/17: ER negative, Her 2 negative  Progressed on Doxil   Endometrial cancer ([place])  06/20/2012 Pathology Results   Biopsy  positive for papillary serous carcinoma   06/20/2012 Genetic Testing   [facility] testing done 11-2015 on surgical path from 2014:    MS stable   TMB low  4 muts/mb   ATM [id number]   ERBB3 T389K   E2H2 rearrangement exon 9   PPP2R1A P179R   TP53 I195N    06/20/2012 Initial Diagnosis   Patient presented to PCP with intermittent vaginal bleeding since ~ Oct 2013, endometrial biopsy 06-20-12 with complex endometrial hyperplasia with atypia   06/26/2012 Imaging   Thickened endometrial lining in a postmenopausal patient experiencing vaginal bleeding. In the setting of post-menopausal bleeding, endometrial sampling is indicated to exclude carcinoma. No focal myometrial abnormalities are seen.  Normal left ovary and non-visualized right ovary   07/30/2012 Surgery   Dr. Polly Cobia performed robotic hysterectomy with bilateral salpingo-oophorectomy, bilateral pelvic lymph node dissection and periaortic lymph node dissection. Intraoperatively on frozen section, the patient was noted to have a large endometrial polyp with changes within the polyp consistent for high-grade malignancy, possibly papillary serous carcinoma. There is no obvious extrauterine disease noted.  07/30/2012 Pathology Results   [phone] SUPPLEMENTAL REPORT  THE ENDOMETRIAL CARCINOMA WAS ANALYZED FOR DNA MISMATCH REPAIR PROTEINS.  IMMUNOHISTOCHEMICALLY, THE NEOPLASM RETAINED NUCLEAR EXPRESSION OF 4 GENE PRODUCTS, MLH1, MSH2, MSH6, AND PMS2, INVOLVED IN DNA MISMATCH REPAIR.  POSITIVE AND NEGATIVE CONTROLS WORKED APPROPRIATELY.  PER REQUEST, AN ER AND PR ARE PERFORMED ON BLOCK 1I.  ER- APPROXIMATELY 25-35% STAINING IN NEOPLASTIC CELLS (INTERMEDIATE)  PR- APPROXIMATELY 25-35% STAINING IN NEOPLASTIC CELLS (STRONG)  ER AND PR PREDOMINANTLY SHOW STAINING IN THE SEROUS COMPONENT.  DIAGNOSIS  1. UTERUS, CERVIX WITH BILATERAL FALLOPIAN TUBES AND OVARIES,  HYSTERECTOMY AND BILATERAL SALPINGO-OOPHORECTOMY:  HIGH GRADE/POORLY  DIFFERENTIATED ENDOMETRIAL ADENOCARCINOMA WITH SOLID AND SEROUS PAPILLARY COMPONENTS.  HISTOPATHOLOGIC TYPE:    A VARIETY OF PATTERNS WERE PRESENT, ALL OF WHICH SHOULD BE CONSIDERED TO BE HIGH GRADE.  SEROUS PAPILLARY CARCINOMA WAS SEEN OCCURRING ADJACENT TO SOLID ENDOMETRIAL CARCINOMA WITH MARKED ANAPLASIA AND GIANT CELLS. SIZE: TUMOR MEASURED AT LEAST 4.8 CM IN GREATEST HORIZONTAL DIMENSION.  GRADE: POORLY DIFFERENTIATED OR HIGH GRADE. DEPTH OF INVASION: NO DEFINITE MYOMETRIAL INVOLVEMENT WAS SEEN.  THE TUMOR APPEARED CONFINED TO THE POLYP AS WELL AS SURFACE ENDOMETRIUM. WHERE MYOMETRIAL THICKNESS NOT APPLICABLE. SEROSAL INVOLVEMENT:       NOT DEMONSTRATED.  ENDOCERVICAL INVOLVEMENT:  NOT DEMONSTRATED. RESECTION MARGINS:         FREE OF INVOLVEMENT. EXTRAUTERINE EXTENSION:    NOT DEMONSTRATED. ANGIOLYMPHATIC INVASION:   NOT DEFINITELY SEEN. TOTAL NODES EXAMINED:      22.    PELVIC NODES EXAMINED:  20.    PELVIC NODES INVOLVED:  0.    PARA-AORTIC NODES       2. EXAMINED:    PARA-AORTIC NODES       0. INVOLVED TNM STAGE:                 T1A N0 MX. AJCC STAGE GROUPING:       IA. FIGO STAGE:                IA.   08/26/2012 Imaging   No CT evidence for intra-abdominal or pelvic metastatic disease. Trace free pelvic fluid, presumably postoperative although the date of surgery is not documented in the electronic medical record.   08/26/2012 - 12/13/2012 Chemotherapy   She received 6 cycles of carbo/taxol    10/29/2012 - 11/27/2012 Radiation Therapy   May 27, June 5,  June 11, June 19, November 27, 2012: Proximal vagina 30 Gy in 5 fractions      01/17/2013 Imaging   1.  No evidence of recurrent or metastatic disease. 2.  No acute abnormality involving the abdomen or pelvis. 3.  Mild diffuse hepatic steatosis. 4.  Very small supraumbilical midline anterior abdominal wall hernia containing fat, unchanged   01/22/2014 Imaging   No evidence for metastatic or recurrent disease. 2. No bowel  obstruction.  Normal appendix. 3. Small fat containing hernia, stable in appearance. 4. Status post hysterectomy and bilateral oophorectomy   02/19/2014 Imaging   No pulmonary lesions are identified. The abnormality on the chest x-ray is due to asymmetric left-sided sternoclavicular joint degenerative disease   10/12/2014 Imaging   New mild retroperitoneal lymphadenopathy in the left paraaortic region and proximal left common iliac chain, consistent with metastatic disease. No other sites of metastatic disease identified within the abdomen or pelvis.       11/27/2014 - 02/11/2015 Chemotherapy   She received 4 cycles of carbo/taxol    03/01/2015 PET scan   Single hypermetabolic small retroperitoneal lymph  node along the aorta. 2. No evidence of metastatic disease otherwise in the abdomen or pelvis. No evidence local recurrence. 3. Intensely hypermetabolic enlarged nodule adjacent to the RIGHT lobe of thyroid gland. This presumably represents the biopsied lesion in clinician report which was found to be benign thyroid tissue.   04/05/2015 - 05/13/2015 Radiation Therapy   She received 50.4 gray in 28 fractions with simultaneous integrated boost to 56 gray   06/17/2015 Imaging   No acute process or evidence of metastatic disease in the abdomen or pelvis. Resolution of previously described retroperitoneal adenopathy. 2.  Possible constipation. 3. Atherosclerosis.   06/17/2015 Tumor Marker   Patient's tumor was tested for the following markers: CA125 Results of the tumor marker test revealed 33   08/12/2015 Tumor Marker   Patient's tumor was tested for the following markers: CA125 Results of the tumor marker test revealed 52   08/31/2015 PET scan   Development of right paratracheal hypermetabolic adenopathy, consistent with nodal metastasis. 2. The previously described isolated abdominal retroperitoneal hypermetabolic node has resolved. 3. Persistent hypermetabolic right thyroid nodule, per report  previously biopsied. Correlate with those results. 4.  Possible constipation.   09/09/2015 -  Anti-estrogen oral therapy   She has been receiving alternative treatment between megace and tamoxifen   09/09/2015 Tumor Marker   Patient's tumor was tested for the following markers: CA125 Results of the tumor marker test revealed 37.8   09/20/2015 Procedure   Technically successful ultrasound-guided thyroid aspiration biopsy , dominant right nodule   09/20/2015 Pathology Results   THYROID, RIGHT, FINE NEEDLE ASPIRATION (SPECIMEN 1 OF 1 COLLECTED 09/20/15): FINDINGS CONSISTENT WITH BENIGN THYROID NODULE (BETHESDA CATEGORY II).   10/28/2015 Tumor Marker   Patient's tumor was tested for the following markers: CA125 Results of the tumor marker test revealed 53.2   11/04/2015 Imaging   Stable benign right thyroid nodule   12/16/2015 Tumor Marker   Patient's tumor was tested for the following markers: CA125 Results of the tumor marker test revealed 38.1   03/22/2016 PET scan   Interval progression of hypermetabolic right paratracheal lymph node consistent with metastatic involvement. 2. Stable hypermetabolic right thyroid nodule. Reportedly this has been biopsied in the past.   03/23/2016 Tumor Marker   Patient's tumor was tested for the following markers: CA125 Results of the tumor marker test revealed 62.4   04/13/2016 - 06/01/2016 Radiation Therapy   She received 56 Gy to the chest in 28 fractions    05/09/2016 Imaging   No evidence of left lower extremity deep vein thrombosis. No evidence of a superficial thrombosis of the greater and lesser saphenous veins. Positive for thrombus noted in several varicosities of the calf. No evidence of Baker's cyst on the left.   05/17/2016 Tumor Marker   Patient's tumor was tested for the following markers: CA125 Results of the tumor marker test revealed 9   06/29/2016 Tumor Marker   Patient's tumor was tested for the following markers: CA125 Results of  the tumor marker test revealed 5.9   08/04/2016 Tumor Marker   Patient's tumor was tested for the following markers: CA125 Results of the tumor marker test revealed 6.0   09/12/2016 PET scan   Complete metabolic response to therapy, with resolution of hypermetabolic mediastinal lymphadenopathy since prior exam. No residual or new metastatic disease identified. Stable hypermetabolic right thyroid lobe nodule, which was previously biopsied on 09/20/2015.   03/13/2017 PET scan   1. Solitary focus of recurrent right paratracheal hypermetabolic activity, with a 1.0  cm right lower paratracheal node having a maximum SUV of 9.0. Appearance compatible with recurrent malignancy. 2. Continued hypermetabolic right thyroid nodule, previously biopsied, presumed benign -correlate with prior biopsy results. 3. Other imaging findings of potential clinical significance: Aortic Atherosclerosis (ICD10-I70.0). Mild cardiomegaly. Prominent stool throughout the colon favors constipation.   04/02/2017 Pathology Results   FINE NEEDLE ASPIRATION, ENDOSCOPIC, (EBUS) 4 R NODE (SPECIMEN 1 OF 2 COLLECTED 04/02/17): MALIGNANT CELLS PRESENT, CONSISTENT WITH CARCINOMA. SEE COMMENT. COMMENT: THE MALIGNANT CELLS ARE POSITIVE FOR P53 AND NEGATIVE FOR ESTROGEN RECEPTOR AND TTF-1. THIS PROFILE IS NON-SPECIFIC, BUT THE P53 POSITIVE STAINING IS SUGGESTIVE OF GYNECOLOGIC PRIMARY.    04/11/2017 Procedure   Successful 8 French right internal jugular vein power port placement with its tip at the SVC/RA junction   04/12/2017 Imaging   Normal LV size with mild LV hypertrophy. EF 55-60%. Normal RV size and systolic function. Aortic valve sclerosis without significant stenosis.   04/18/2017 - 06/14/2017 Chemotherapy   The patient had chemotherapy with Doxil    07/10/2017 Imaging   - Left ventricle: The cavity size was normal. There was mild concentric hypertrophy. Systolic function was normal. The estimated ejection fraction was in the  range of 55% to 60%. Wall motion was normal; there were no regional wall motion abnormalities. Doppler parameters are consistent with abnormal left ventricular relaxation (grade 1 diastolic dysfunction). - Aortic valve: Noncoronary cusp mobility was mildly restricted. - Mitral valve: There was mild regurgitation. - Left atrium: The atrium was mildly dilated. - Atrial septum: No defect or patent foramen ovale was identified.  Impressions:  - Compared to November 2018, global LV longitudinal strain remains normal (has increased)   07/11/2017 PET scan   Increased size and hypermetabolic activity of 3.3 cm right thyroid lobe nodule. Thyroid carcinoma cannot be excluded. Recommend repeat ultrasound guided fine needle aspiration to exclude thyroid carcinoma.  New adjacent hypermetabolic 10 mm right supraclavicular lymph node, suspicious for lymph node metastasis.  Slight increase in size and hypermetabolic activity of solitary right paratracheal lymph node.  No evidence of abdominal or pelvic metastatic disease.   07/18/2017 Procedure   1. Technically successful ultrasound guided fine needle aspiration of indeterminate hypermetabolic right-sided thyroid nodule/mass. 2. Technically successful ultrasound-guided core needle biopsy of hypermetabolic right lower cervical lymph node.   07/18/2017 Pathology Results   THYROID, FINE NEEDLE ASPIRATION RIGHT (SPECIMEN 1 OF 1, COLLECTED ON 07/18/2017): ATYPIA OF UNDETERMINED SIGNIFICANCE OR FOLLICULAR LESION OF UNDETERMINED SIGNIFICANCE (BETHESDA CATEGORY III). SEE COMMENT. COMMENT: THE SPECIMEN CONSISTS OF SMALL AND MEDIUM SIZED GROUPS OF FOLLICULAR EPITHELIAL CELLS WITH MILD CYTOLOGIC ATYPIA INCLUDING NUCLEAR ENLARGEMENT AND HURTHLE CELL CHANGE. SOME GROUPS ARE ARRANGED AS MICROFOLLICLES. THERE IS MINIMAL BACKGROUND COLLOID. BASED ON THESE FEATURES, A FOLLICULAR LESION/NEOPLASM CAN NOT BE ENTIRELY RULED OUT. A SPECIMEN WILL BE SENT FOR AFIRMA TESTING.    07/19/2017 Pathology Results   Lymph node, needle/core biopsy - METASTATIC PAPILLARY SEROUS CARCINOMA. - SEE COMMENT. Microscopic Comment Dr. Vicente Males has reviewed the case and concurs with this interpretation   10/15/2017 PET scan   No new or progressive disease. No evidence of abdominal or pelvic metastatic disease.  Stable hypermetabolic right thyroid lobe nodule and adjacent right supraclavicular lymph node.  Decreased size and hypermetabolic activity of solitary right paratracheal lymph node.   01/23/2018 PET scan   1. Overall, no significant change from PET-CT of 3 months ago. There is persistent hypermetabolic activity at the right thoracic inlet and within a right paratracheal mediastinal node. The nodes  have not significantly changed in size, although the metabolic activity has minimally increased over this interval. It is uncertain how much of the thoracic inlet metabolic activity is attributed to the thyroid nodule versus adjacent cervical lymph nodes, both previously biopsied. 2. No new hypermetabolic activity within the neck, chest, abdomen or pelvis. No evidence of local recurrence in the pelvis. 3. Stable probable radiation changes in the right lung.   04/16/2018 PET scan   1. Interval increase in metabolic activity of the RIGHT supraclavicular node and RIGHT lower paratracheal mediastinal node with minimal change in size. Findings consistent with persistent and mildly progressed metastatic adenopathy. 2. New hypermetabolic small lymph node in the RIGHT lower paratracheal nodal station adjacent to the previously followed node. 3. No evidence of local recurrence in the pelvis or new disease elsewhere.   07/30/2018 PET scan   1. Mild interval progression of right supraclavicular, mediastinal, and right hilar hypermetabolic metastatic disease. There is a new hypermetabolic lymph node in the subcarinal station on today's study. 2. No hypermetabolic disease in the abdomen or  pelvis to suggest recurrent/metastases. 3.  Aortic Atherosclerois (ICD10-170.0)     08/16/2018 -  Chemotherapy   The patient had Lenvima since 3/6 and pembrolizumab since 3/13 for chemotherapy treatment. Michel Santee was held temporarily due to infection and severe hypertension   10/31/2018 PET scan   Partial response to therapy, with decreased hypermetabolic lymphadenopathy in mediastinum and right hilar region.   No significant change in hypermetabolic lymphadenopathy in right inferior neck.   No new or progressive metastatic disease identified. No evidence of recurrent or metastatic carcinoma in the pelvis or abdomen   02/04/2019 PET scan   1. Decrease in metabolic activity of RIGHT supraclavicular node and RIGHT paratracheal lymph nodes. 2. Persistent activity in the RIGHT thyroid nodule unchanged. 3. Diffuse activity through the esophagus is favored esophagitis. No change. 4. Scattered foci of intense metabolic activity associated the bowel without focal lesion on CT favored benign. 5. No evidence of peritoneal metastasis. 6. No evidence of metastatic adenopathy in the abdomen pelvis. 7. No evidence of local pelvic sidewall recurrence.   07/30/2019 PET scan   1. Mildly increased uptake within small nodes along the right sternocleidomastoid without change in size and associated with increased deltoid activity may reflect changes related to right arm injection or recent COVID vaccination, consider clinical correlation and attention on follow-up. 2. Persistent activity in the right thyroid nodule, unchanged from previous exams. 3. New activity in a juxta esophageal lymph node in the chest is suspicious though the node is unchanged with respect to size. Endoscopic correlation could potentially be helpful or close attention on follow-up. 4. Subtle increased FDG uptake along the left mainstem bronchus and adjacent to the aorta may represent a small lymph node. No discrete correlate is demonstrated on  today's study.   10/22/2019 PET scan   1. Persistent FDG avid subcentimeter right supraclavicular and juxta esophageal lymph nodes. The degree of FDG uptake is similar to the previous exam. No new or progressive findings identified. 2. No findings of solid organ metastasis, skeletal metastasis or metastatic disease to the abdomen or pelvis. 3. FDG avid right lobe of thyroid gland nodule is again noted This nodule is not incidental and been worked up previously with 2 biopsies. Please refer to results from the most recent biopsy dated 07/18/2017. (Ref: J Am Coll Radiol. 2015 Feb;12(2): 143-50). 4.  Aortic Atherosclerosis (ICD10-I70.0).   05/05/2020 PET scan   1. Progressive disease, as evidenced by  increased hypermetabolism within right supraclavicular and mediastinal nodes. 2. No infradiaphragmatic hypermetabolic metastasis. 3.  Aortic Atherosclerosis (ICD10-I70.0). 4. Hypermetabolic right thyroid nodule has been detailed on prior exams and was biopsied on 07/18/2017   09/10/2020 PET scan   1. Interval improvement in previously demonstrated hypermetabolic mediastinal and right supraclavicular adenopathy. A single hypermetabolic left paraesophageal lymph node remains, improved from previous study. The other nodes have resolved.  2. No progressive disease. 3. Diffuse esophageal hypermetabolic activity suggesting esophagitis. 4. Hypermetabolic right thyroid nodule has been previously biopsied.   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 Lenvima was held temporarily due to infection and severe hypertension     PHYSICAL EXAMINATION: ECOG PERFORMANCE STATUS: 0 - Asymptomatic  Vitals:   02/04/21 1051  BP: 120/72  Pulse: 64  Resp: 18  Temp: 100 F (37.8 C)  SpO2: 100%   Filed Weights    02/04/21 1051  Weight: 158 lb (71.7 kg)    GENERAL:alert, no distress and comfortable SKIN: skin color, texture, turgor are normal, no rashes or significant lesions EYES: normal, Conjunctiva are pink and non-injected, sclera clear OROPHARYNX:no exudate, no erythema and lips, buccal mucosa, and tongue normal  NECK: supple, thyroid normal size, non-tender, without nodularity LYMPH:  no palpable lymphadenopathy in the cervical, axillary or inguinal LUNGS: clear to auscultation and percussion with normal breathing effort HEART: regular rate & rhythm and no murmurs and no lower extremity edema ABDOMEN:abdomen soft, non-tender and normal bowel sounds Musculoskeletal:no cyanosis of digits and no clubbing  NEURO: alert & oriented x 3 with fluent speech, no focal motor/sensory deficits  LABORATORY DATA:  I have reviewed the data as listed    Component Value Date/Time   NA 139 02/04/2021 1034   NA 141 05/17/2017 0957   K 4.1 02/04/2021 1034   K 3.6 05/17/2017 0957   CL 102 02/04/2021 1034   CL 105 11/19/2012 0848   CO2 27 02/04/2021 1034   CO2 25 05/17/2017 0957   GLUCOSE 102 (H) 02/04/2021 1034   GLUCOSE 85 05/17/2017 0957   GLUCOSE 122 (H) 11/19/2012 0848   BUN 20 02/04/2021 1034   BUN 15.2 05/17/2017 0957   CREATININE 0.80 02/04/2021 1034   CREATININE 0.86 12/23/2020 1118   CREATININE 0.8 05/17/2017 0957   CALCIUM 9.6 02/04/2021 1034   CALCIUM 9.6 05/17/2017 0957   PROT 7.0 02/04/2021 1034   PROT 7.0 05/17/2017 0957   ALBUMIN 3.7 02/04/2021 1034   ALBUMIN 3.9 05/17/2017 0957   AST 21 02/04/2021 1034   AST 27 12/23/2020 1118   AST 25 05/17/2017 0957   ALT 17 02/04/2021 1034   ALT 20 12/23/2020 1118   ALT 18 05/17/2017 0957   ALKPHOS 49 02/04/2021 1034   ALKPHOS 57 05/17/2017 0957   BILITOT 0.5 02/04/2021 1034   BILITOT 0.8 12/23/2020 1118   BILITOT 0.47 05/17/2017 0957   GFRNONAA >60 02/04/2021 1034   GFRNONAA >60 12/23/2020 1118   GFRAA >60 02/26/2020 0940   GFRAA >60  07/10/2019 1240    No results found for: SPEP, UPEP  Lab Results  Component Value Date   WBC 3.2 (L) 02/04/2021   NEUTROABS 2.1 02/04/2021   HGB 10.1 (L) 02/04/2021   HCT 30.9 (L) 02/04/2021   MCV 95.7 02/04/2021   PLT 147 (L) 02/04/2021  Chemistry      Component Value Date/Time   NA 139 02/04/2021 1034   NA 141 05/17/2017 0957   K 4.1 02/04/2021 1034   K 3.6 05/17/2017 0957   CL 102 02/04/2021 1034   CL 105 11/19/2012 0848   CO2 27 02/04/2021 1034   CO2 25 05/17/2017 0957   BUN 20 02/04/2021 1034   BUN 15.2 05/17/2017 0957   CREATININE 0.80 02/04/2021 1034   CREATININE 0.86 12/23/2020 1118   CREATININE 0.8 05/17/2017 0957   GLU 158 (H) 01/14/2015 1035      Component Value Date/Time   CALCIUM 9.6 02/04/2021 1034   CALCIUM 9.6 05/17/2017 0957   ALKPHOS 49 02/04/2021 1034   ALKPHOS 57 05/17/2017 0957   AST 21 02/04/2021 1034   AST 27 12/23/2020 1118   AST 25 05/17/2017 0957   ALT 17 02/04/2021 1034   ALT 20 12/23/2020 1118   ALT 18 05/17/2017 0957   BILITOT 0.5 02/04/2021 1034   BILITOT 0.8 12/23/2020 1118   BILITOT 0.47 05/17/2017 0957

## 2021-02-04 NOTE — Assessment & Plan Note (Signed)
She has intermittent elevated TSH I will adjust her thyroid medicine accordingly 

## 2021-02-04 NOTE — Assessment & Plan Note (Signed)
Her last imaging showed excellent response of palliative radiation therapy We will continue Lenvima with Keytruda indefinitely I plan to space out the interval between one imaging to another I plan to repeat imaging study in October

## 2021-02-04 NOTE — Assessment & Plan Note (Signed)
She had intermittent pancytopenia Recent B12 level in June was low She will receive vitamin B12 injection monthly Plan to recheck end of the year

## 2021-02-04 NOTE — Patient Instructions (Signed)
[facility]  Discharge Instructions: ?Thank you for choosing [facility] to provide your oncology and hematology care.  ? ?If you have a lab appointment with the Cancer Center, please go directly to the Cancer Center and check in at the registration area. ?  ?Wear comfortable clothing and clothing appropriate for easy access to any Portacath or PICC line.  ? ?We strive to give you quality time with your provider. You may need to reschedule your appointment if you arrive late (15 or more minutes).  Arriving late affects you and other patients whose appointments are after yours.  Also, if you miss three or more appointments without notifying the office, you may be dismissed from the clinic at the provider?s discretion.    ?  ?For prescription refill requests, have your pharmacy contact our office and allow 72 hours for refills to be completed.   ? ?Today you received the following chemotherapy and/or immunotherapy agents: Keytruda ?  ?To help prevent nausea and vomiting after your treatment, we encourage you to take your nausea medication as directed. ? ?BELOW ARE SYMPTOMS THAT SHOULD BE REPORTED IMMEDIATELY: ?*FEVER GREATER THAN 100.4 F (38 ?C) OR HIGHER ?*CHILLS OR SWEATING ?*NAUSEA AND VOMITING THAT IS NOT CONTROLLED WITH YOUR NAUSEA MEDICATION ?*UNUSUAL SHORTNESS OF BREATH ?*UNUSUAL BRUISING OR BLEEDING ?*URINARY PROBLEMS (pain or burning when urinating, or frequent urination) ?*BOWEL PROBLEMS (unusual diarrhea, constipation, pain near the anus) ?TENDERNESS IN MOUTH AND THROAT WITH OR WITHOUT PRESENCE OF ULCERS (sore throat, sores in mouth, or a toothache) ?UNUSUAL RASH, SWELLING OR PAIN  ?UNUSUAL VAGINAL DISCHARGE OR ITCHING  ? ?Items with * indicate a potential emergency and should be followed up as soon as possible or go to the Emergency Department if any problems should occur. ? ?Please show the CHEMOTHERAPY ALERT CARD or IMMUNOTHERAPY ALERT CARD at check-in to the  Emergency Department and triage nurse. ? ?Should you have questions after your visit or need to cancel or reschedule your appointment, please contact [facility]  Dept: [phone]  and follow the prompts.  Office hours are 8:00 a.m. to 4:30 p.m. Monday - Friday. Please note that voicemails left after 4:00 p.m. may not be returned until the following business day.  We are closed weekends and major holidays. You have access to a nurse at all times for urgent questions. Please call the main number to the clinic Dept: [phone] and follow the prompts. ? ? ?For any non-urgent questions, you may also contact your provider using MyChart. We now offer e-Visits for anyone 18 and older to request care online for non-urgent symptoms. For details visit [email or web address]. ?  ?Also download the MyChart app! Go to the app store, search "MyChart", open the app, select [facility], and log in with your MyChart username and password. ? ?Due to Covid, a mask is required upon entering the hospital/clinic. If you do not have a mask, one will be given to you upon arrival. For doctor visits, patients may have 1 support person aged 18 or older with them. For treatment visits, patients cannot have anyone with them due to current Covid guidelines and our immunocompromised population.  ? ?

## 2021-02-08 ENCOUNTER — Other Ambulatory Visit: Payer: Self-pay | Admitting: Family Medicine

## 2021-02-08 ENCOUNTER — Other Ambulatory Visit: Payer: Self-pay

## 2021-02-08 ENCOUNTER — Encounter: Payer: Self-pay | Admitting: Hematology and Oncology

## 2021-02-08 ENCOUNTER — Ambulatory Visit
Admission: RE | Admit: 2021-02-08 | Discharge: 2021-02-08 | Disposition: A | Discharge status: Home / Self Care | Payer: 59 | Source: Ambulatory Visit | Attending: Family Medicine | Admitting: Family Medicine

## 2021-02-08 DIAGNOSIS — M25561 Pain in right knee: Secondary | ICD-10-CM | POA: Diagnosis not present

## 2021-02-08 DIAGNOSIS — M1711 Unilateral primary osteoarthritis, right knee: Secondary | ICD-10-CM | POA: Diagnosis not present

## 2021-02-16 ENCOUNTER — Other Ambulatory Visit (HOSPITAL_COMMUNITY): Payer: Self-pay

## 2021-02-25 ENCOUNTER — Other Ambulatory Visit: Payer: Self-pay

## 2021-02-25 ENCOUNTER — Inpatient Hospital Stay: Payer: 59

## 2021-02-25 VITALS — BP 147/93 | HR 70 | Temp 98.1°F | Resp 16

## 2021-02-25 DIAGNOSIS — Z7189 Other specified counseling: Secondary | ICD-10-CM

## 2021-02-25 DIAGNOSIS — Z95828 Presence of other vascular implants and grafts: Secondary | ICD-10-CM

## 2021-02-25 DIAGNOSIS — C541 Malignant neoplasm of endometrium: Secondary | ICD-10-CM

## 2021-02-25 DIAGNOSIS — C770 Secondary and unspecified malignant neoplasm of lymph nodes of head, face and neck: Secondary | ICD-10-CM

## 2021-02-25 DIAGNOSIS — Z5112 Encounter for antineoplastic immunotherapy: Secondary | ICD-10-CM | POA: Diagnosis not present

## 2021-02-25 DIAGNOSIS — E039 Hypothyroidism, unspecified: Secondary | ICD-10-CM

## 2021-02-25 LAB — CBC WITH DIFFERENTIAL/PLATELET
Abs Immature Granulocytes: 0.01 10*3/uL (ref 0.00–0.07)
Basophils Absolute: 0.1 10*3/uL (ref 0.0–0.1)
Basophils Relative: 2 %
Eosinophils Absolute: 0.3 10*3/uL (ref 0.0–0.5)
Eosinophils Relative: 8 %
HCT: 33.1 % — ABNORMAL LOW (ref 36.0–46.0)
Hemoglobin: 10.9 g/dL — ABNORMAL LOW (ref 12.0–15.0)
Immature Granulocytes: 0 %
Lymphocytes Relative: 22 %
Lymphs Abs: 0.9 10*3/uL (ref 0.7–4.0)
MCH: 31.6 pg (ref 26.0–34.0)
MCHC: 32.9 g/dL (ref 30.0–36.0)
MCV: 95.9 fL (ref 80.0–100.0)
Monocytes Absolute: 0.3 10*3/uL (ref 0.1–1.0)
Monocytes Relative: 8 %
Neutro Abs: 2.4 10*3/uL (ref 1.7–7.7)
Neutrophils Relative %: 60 %
Platelets: 175 10*3/uL (ref 150–400)
RBC: 3.45 MIL/uL — ABNORMAL LOW (ref 3.87–5.11)
RDW: 13.3 % (ref 11.5–15.5)
WBC: 4 10*3/uL (ref 4.0–10.5)
nRBC: 0 % (ref 0.0–0.2)

## 2021-02-25 LAB — COMPREHENSIVE METABOLIC PANEL
ALT: 17 U/L (ref 0–44)
AST: 24 U/L (ref 15–41)
Albumin: 4 g/dL (ref 3.5–5.0)
Alkaline Phosphatase: 53 U/L (ref 38–126)
Anion gap: 10 (ref 5–15)
BUN: 18 mg/dL (ref 8–23)
CO2: 26 mmol/L (ref 22–32)
Calcium: 10 mg/dL (ref 8.9–10.3)
Chloride: 100 mmol/L (ref 98–111)
Creatinine, Ser: 0.83 mg/dL (ref 0.44–1.00)
GFR, Estimated: >60 mL/min (ref >60)
Glucose, Bld: 97 mg/dL (ref 70–99)
Potassium: 4.2 mmol/L (ref 3.5–5.1)
Sodium: 136 mmol/L (ref 135–145)
Total Bilirubin: 0.5 mg/dL (ref 0.3–1.2)
Total Protein: 7.4 g/dL (ref 6.5–8.1)

## 2021-02-25 LAB — TSH: TSH: 3.38 u[IU]/mL (ref 0.308–3.960)

## 2021-02-25 MED ORDER — SODIUM CHLORIDE 0.9 % IV SOLN: Freq: Once | INTRAVENOUS | Status: AC

## 2021-02-25 MED ORDER — SODIUM CHLORIDE 0.9% FLUSH
10.0000 mL | Freq: Once | INTRAVENOUS | Status: AC
  Administered 2021-02-25: 10 mL

## 2021-02-25 MED ORDER — HEPARIN SOD (PORK) LOCK FLUSH 100 UNIT/ML IV SOLN
500.0000 [IU] | Freq: Once | INTRAVENOUS | Status: AC | PRN
  Administered 2021-02-25: 500 [IU]

## 2021-02-25 MED ORDER — SODIUM CHLORIDE 0.9 % IV SOLN
200.0000 mg | Freq: Once | INTRAVENOUS | Status: AC
  Administered 2021-02-25: 200 mg via INTRAVENOUS
  Filled 2021-02-25: qty 8

## 2021-02-25 MED ORDER — SODIUM CHLORIDE 0.9% FLUSH
10.0000 mL | INTRAVENOUS | Status: DC | PRN
  Administered 2021-02-25: 10 mL

## 2021-03-02 ENCOUNTER — Ambulatory Visit: Payer: 59 | Attending: Internal Medicine

## 2021-03-02 ENCOUNTER — Ambulatory Visit: Payer: Self-pay

## 2021-03-02 DIAGNOSIS — Z23 Encounter for immunization: Secondary | ICD-10-CM

## 2021-03-02 NOTE — Progress Notes (Signed)
   Covid-19 Vaccination Clinic  Name:  Diane Lozano    MRN: [id number] DOB: 24-Mar-1956  03/02/2021  [name] was observed post Covid-19 immunization for 15 minutes without incident. She was provided with Vaccine Information Sheet and instruction to access the V-Safe system.   [name] was instructed to call 911 with any severe reactions post vaccine: Difficulty breathing  Swelling of face and throat  A fast heartbeat  A bad rash all over body  Dizziness and weakness

## 2021-03-10 ENCOUNTER — Other Ambulatory Visit (HOSPITAL_COMMUNITY): Payer: Self-pay

## 2021-03-10 ENCOUNTER — Other Ambulatory Visit: Payer: Self-pay | Admitting: Hematology and Oncology

## 2021-03-11 ENCOUNTER — Encounter: Payer: Self-pay | Admitting: Hematology and Oncology

## 2021-03-11 ENCOUNTER — Other Ambulatory Visit (HOSPITAL_COMMUNITY): Payer: Self-pay

## 2021-03-11 ENCOUNTER — Other Ambulatory Visit (HOSPITAL_BASED_OUTPATIENT_CLINIC_OR_DEPARTMENT_OTHER): Payer: Self-pay

## 2021-03-11 MED ORDER — BUSPIRONE HCL 10 MG PO TABS
10.0000 mg | ORAL_TABLET | Freq: Two times a day (BID) | ORAL | 0 refills | Status: DC
  Filled 2021-03-11: qty 180, 90d supply, fill #0

## 2021-03-11 MED ORDER — ONDANSETRON HCL 8 MG PO TABS
8.0000 mg | ORAL_TABLET | Freq: Three times a day (TID) | ORAL | 3 refills | Status: DC | PRN
  Filled 2021-03-11: qty 30, 10d supply, fill #0

## 2021-03-11 MED ORDER — COVID-19MRNA BIVAL VACC PFIZER 30 MCG/0.3ML IM SUSP
INTRAMUSCULAR | 0 refills | Status: DC
  Filled 2021-03-11: qty 0.3, 1d supply, fill #0

## 2021-03-14 ENCOUNTER — Other Ambulatory Visit (HOSPITAL_COMMUNITY): Payer: Self-pay

## 2021-03-17 ENCOUNTER — Ambulatory Visit (HOSPITAL_COMMUNITY)
Admission: RE | Admit: 2021-03-17 | Discharge: 2021-03-17 | Disposition: A | Discharge status: Home / Self Care | Payer: 59 | Source: Ambulatory Visit | Attending: Hematology and Oncology | Admitting: Hematology and Oncology

## 2021-03-17 DIAGNOSIS — E041 Nontoxic single thyroid nodule: Secondary | ICD-10-CM | POA: Diagnosis not present

## 2021-03-17 DIAGNOSIS — C541 Malignant neoplasm of endometrium: Secondary | ICD-10-CM | POA: Insufficient documentation

## 2021-03-17 DIAGNOSIS — C55 Malignant neoplasm of uterus, part unspecified: Secondary | ICD-10-CM | POA: Diagnosis not present

## 2021-03-17 DIAGNOSIS — J8410 Pulmonary fibrosis, unspecified: Secondary | ICD-10-CM | POA: Diagnosis not present

## 2021-03-17 DIAGNOSIS — Z8541 Personal history of malignant neoplasm of cervix uteri: Secondary | ICD-10-CM | POA: Diagnosis not present

## 2021-03-17 LAB — GLUCOSE, CAPILLARY: Glucose-Capillary: 88 mg/dL (ref 70–99)

## 2021-03-17 MED ORDER — FLUDEOXYGLUCOSE F - 18 (FDG) INJECTION
8.0000 | Freq: Once | INTRAVENOUS | Status: AC | PRN
  Administered 2021-03-17: 7.89 via INTRAVENOUS

## 2021-03-18 ENCOUNTER — Inpatient Hospital Stay: Payer: 59

## 2021-03-18 ENCOUNTER — Other Ambulatory Visit: Payer: Self-pay

## 2021-03-18 ENCOUNTER — Other Ambulatory Visit (HOSPITAL_COMMUNITY): Payer: Self-pay

## 2021-03-18 ENCOUNTER — Encounter: Payer: Self-pay | Admitting: Hematology and Oncology

## 2021-03-18 ENCOUNTER — Inpatient Hospital Stay (HOSPITAL_BASED_OUTPATIENT_CLINIC_OR_DEPARTMENT_OTHER): Payer: 59 | Admitting: Hematology and Oncology

## 2021-03-18 ENCOUNTER — Inpatient Hospital Stay: Payer: 59 | Attending: Hematology and Oncology

## 2021-03-18 DIAGNOSIS — Z95828 Presence of other vascular implants and grafts: Secondary | ICD-10-CM

## 2021-03-18 DIAGNOSIS — C541 Malignant neoplasm of endometrium: Secondary | ICD-10-CM

## 2021-03-18 DIAGNOSIS — I10 Essential (primary) hypertension: Secondary | ICD-10-CM

## 2021-03-18 DIAGNOSIS — C770 Secondary and unspecified malignant neoplasm of lymph nodes of head, face and neck: Secondary | ICD-10-CM

## 2021-03-18 DIAGNOSIS — Z5112 Encounter for antineoplastic immunotherapy: Secondary | ICD-10-CM | POA: Insufficient documentation

## 2021-03-18 DIAGNOSIS — R946 Abnormal results of thyroid function studies: Secondary | ICD-10-CM | POA: Diagnosis not present

## 2021-03-18 DIAGNOSIS — Z7189 Other specified counseling: Secondary | ICD-10-CM

## 2021-03-18 DIAGNOSIS — D61818 Other pancytopenia: Secondary | ICD-10-CM | POA: Diagnosis not present

## 2021-03-18 DIAGNOSIS — E039 Hypothyroidism, unspecified: Secondary | ICD-10-CM

## 2021-03-18 LAB — CMP (CANCER CENTER ONLY)
ALT: 21 U/L (ref 0–44)
AST: 24 U/L (ref 15–41)
Albumin: 4 g/dL (ref 3.5–5.0)
Alkaline Phosphatase: 45 U/L (ref 38–126)
Anion gap: 7 (ref 5–15)
BUN: 19 mg/dL (ref 8–23)
CO2: 28 mmol/L (ref 22–32)
Calcium: 9.3 mg/dL (ref 8.9–10.3)
Chloride: 100 mmol/L (ref 98–111)
Creatinine: 0.84 mg/dL (ref 0.44–1.00)
GFR, Estimated: >60 mL/min (ref >60)
Glucose, Bld: 93 mg/dL (ref 70–99)
Potassium: 4.2 mmol/L (ref 3.5–5.1)
Sodium: 135 mmol/L (ref 135–145)
Total Bilirubin: 0.6 mg/dL (ref 0.3–1.2)
Total Protein: 7.4 g/dL (ref 6.5–8.1)

## 2021-03-18 LAB — CBC WITH DIFFERENTIAL/PLATELET
Abs Immature Granulocytes: 0 10*3/uL (ref 0.00–0.07)
Basophils Absolute: 0 10*3/uL (ref 0.0–0.1)
Basophils Relative: 1 %
Eosinophils Absolute: 0.3 10*3/uL (ref 0.0–0.5)
Eosinophils Relative: 9 %
HCT: 31 % — ABNORMAL LOW (ref 36.0–46.0)
Hemoglobin: 10.3 g/dL — ABNORMAL LOW (ref 12.0–15.0)
Immature Granulocytes: 0 %
Lymphocytes Relative: 25 %
Lymphs Abs: 0.9 10*3/uL (ref 0.7–4.0)
MCH: 31.6 pg (ref 26.0–34.0)
MCHC: 33.2 g/dL (ref 30.0–36.0)
MCV: 95.1 fL (ref 80.0–100.0)
Monocytes Absolute: 0.4 10*3/uL (ref 0.1–1.0)
Monocytes Relative: 10 %
Neutro Abs: 2 10*3/uL (ref 1.7–7.7)
Neutrophils Relative %: 55 %
Platelets: 182 10*3/uL (ref 150–400)
RBC: 3.26 MIL/uL — ABNORMAL LOW (ref 3.87–5.11)
RDW: 13.6 % (ref 11.5–15.5)
WBC: 3.6 10*3/uL — ABNORMAL LOW (ref 4.0–10.5)
nRBC: 0 % (ref 0.0–0.2)

## 2021-03-18 LAB — TSH: TSH: 4.021 u[IU]/mL — ABNORMAL HIGH (ref 0.308–3.960)

## 2021-03-18 MED ORDER — SODIUM CHLORIDE 0.9% FLUSH
10.0000 mL | Freq: Once | INTRAVENOUS | Status: AC
  Administered 2021-03-18: 10 mL

## 2021-03-18 MED ORDER — AMLODIPINE BESYLATE 10 MG PO TABS
ORAL_TABLET | ORAL | 1 refills | Status: DC
  Filled 2021-03-18: qty 90, 90d supply, fill #0
  Filled 2021-06-20: qty 90, 90d supply, fill #1

## 2021-03-18 MED ORDER — HEPARIN SOD (PORK) LOCK FLUSH 100 UNIT/ML IV SOLN
500.0000 [IU] | Freq: Once | INTRAVENOUS | Status: AC | PRN
  Administered 2021-03-18: 500 [IU]

## 2021-03-18 MED ORDER — SODIUM CHLORIDE 0.9 % IV SOLN
200.0000 mg | Freq: Once | INTRAVENOUS | Status: AC
  Administered 2021-03-18: 200 mg via INTRAVENOUS
  Filled 2021-03-18: qty 8

## 2021-03-18 MED ORDER — SODIUM CHLORIDE 0.9% FLUSH
10.0000 mL | INTRAVENOUS | Status: DC | PRN
  Administered 2021-03-18: 10 mL

## 2021-03-18 MED ORDER — SODIUM CHLORIDE 0.9 % IV SOLN: Freq: Once | INTRAVENOUS | Status: AC

## 2021-03-18 NOTE — Progress Notes (Signed)
[place] OFFICE PROGRESS NOTE  Patient Care Team: [name], MD as PCP - General (Family Medicine) Reynold Bowen, MD as Consulting Physician (Endocrinology)  ASSESSMENT & PLAN:  Endometrial cancer [facility]) I have reviewed PET CT imaging with the patient Overall, she have no signs of cancer progression We will continue current treatment with pembrolizumab/Lenvima indefinitely I will space out her next imaging study to 6 months, due in April 2023  Abnormal thyroid scan She had previous biopsy done on the right thyroid nodule that came back nonmalignant Continue observation  Essential hypertension She has intermittent elevated blood pressure I refill her prescriptions today  Pancytopenia, acquired ([place]) This is due to side effects of treatment She is not symptomatic Observe only  No orders of the defined types were placed in this encounter.   All questions were answered. The patient knows to call the clinic with any problems, questions or concerns. The total time spent in the appointment was 30 minutes encounter with patients including review of chart and various tests results, discussions about plan of care and coordination of care plan   [name], MD 03/18/2021 10:51 AM  INTERVAL HISTORY: Please see below for problem oriented charting. she returns for treatment follow-up on pembrolizumab/Lenvima for metastatic, recurrent uterine cancer She tolerated recent treatment well No perceived side effects Her documented blood pressure at home were satisfactory Denies neck pain no new lymphadenopathy  REVIEW OF SYSTEMS:   Constitutional: Denies fevers, chills or abnormal weight loss Eyes: Denies blurriness of vision Ears, nose, mouth, throat, and face: Denies mucositis or sore throat Respiratory: Denies cough, dyspnea or wheezes Cardiovascular: Denies palpitation, chest discomfort or lower extremity swelling Gastrointestinal:  Denies nausea, heartburn or  change in bowel habits Skin: Denies abnormal skin rashes Lymphatics: Denies new lymphadenopathy or easy bruising Neurological:Denies numbness, tingling or new weaknesses Behavioral/Psych: Mood is stable, no new changes  All other systems were reviewed with the patient and are negative.  I have reviewed the past medical history, past surgical history, social history and family history with the patient and they are unchanged from previous note.  ALLERGIES:  has no active allergies.  MEDICATIONS:  Current Outpatient Medications  Medication Sig Dispense Refill   amLODipine (NORVASC) 10 MG tablet TAKE 1 TABLET BY MOUTH ONCE DAILY. **DECREASE SIMVASTATIN TO 20 MG DAILY WHILE ON AMLODIPINE PER MD** 90 tablet 1   busPIRone (BUSPAR) 10 MG tablet TAKE 1 TABLET BY MOUTH TWICE DAILY 180 tablet 0   busPIRone (BUSPAR) 10 MG tablet Take 1 tablet (10 mg total) by mouth 2 (two) times daily. 180 tablet 0   Calcium Carb-Cholecalciferol 500-600 MG-UNIT TABS Take 1 tablet by mouth daily.      Cholecalciferol (VITAMIN D3) 2000 units TABS Take 2,000 Units by mouth daily.      COVID-19 mRNA bivalent vaccine, Pfizer, injection Inject into the muscle. 0.3 mL 0   COVID-19 mRNA Vac-TriS, Pfizer, (PFIZER-BIONT COVID-19 VAC-TRIS) SUSP injection Inject into the muscle. 0.3 mL 0   cyanocobalamin (,VITAMIN B-12,) 1000 MCG/ML injection Inject 1 mL (1,000 mcg total) into the muscle every 30 (thirty) days. 1 mL 11   hydrochlorothiazide (MICROZIDE) 12.5 MG capsule Take 1 capsule (12.5 mg total) by mouth daily. 90 capsule 3   lenvatinib 10 mg daily dose (LENVIMA) capsule TAKE 1 CAPSULE (10 MG TOTAL) BY MOUTH DAILY. 30 each 11   levothyroxine (SYNTHROID) 75 MCG tablet TAKE 1 TABLET BY MOUTH DAILY BEFORE BREAKFAST. 90 tablet 2   LORazepam (ATIVAN) 0.5 MG  tablet Take 0.5 mg by mouth 2 (two) times daily as needed.  0   losartan (COZAAR) 25 MG tablet TAKE 1 TABLET BY MOUTH DAILY 90 tablet 3   metoprolol tartrate (LOPRESSOR) 25 MG  tablet TAKE 1 TABLET BY MOUTH TWICE DAILY 180 tablet 3   ondansetron (ZOFRAN) 8 MG tablet Take 1 tablet (8 mg total) by mouth every 8 (eight) hours as needed for nausea. 30 tablet 3   pantoprazole (PROTONIX) 40 MG tablet TAKE 1 TABLET BY MOUTH ONCE DAILY 90 tablet 3   simvastatin (ZOCOR) 20 MG tablet SMARTSIG:1 Tablet(s) By Mouth Every Evening     simvastatin (ZOCOR) 20 MG tablet Take 1 tablet (20 mg total) by mouth daily in the evening for 90 days. 90 tablet 1   tacrolimus (PROTOPIC) 0.1 % ointment Apply 1 application topically daily as needed (rash).      tacrolimus (PROTOPIC) 0.1 % ointment Apply one application topically twice daily. 30 g 1   venlafaxine XR (EFFEXOR XR) 150 MG 24 hr capsule Take 2 capsules (300 mg total) by mouth every morning with food 180 capsule 0   venlafaxine XR (EFFEXOR-XR) 150 MG 24 hr capsule Take 2 capsules by mouth each morning with food. 180 capsule 1   No current facility-administered medications for this visit.    SUMMARY OF ONCOLOGIC HISTORY: Oncology History Overview Note  [facility] testing done 11-2015 on surgical path from 2014:    MS stable   TMB low  4 muts/mb   ATM [id number]   ERBB3 T389K   E2H2 rearrangement exon 9   PPP2R1A P179R   TP53 I195N    ER- APPROXIMATELY 25-35% STAINING IN NEOPLASTIC CELLS (INTERMEDIATE)  PR- APPROXIMATELY 25-35% STAINING IN NEOPLASTIC CELLS (STRONG)  Repeat biopsy 04/02/17: ER negative, Her 2 negative  Progressed on Doxil   Endometrial cancer ([place])  06/20/2012 Pathology Results   Biopsy positive for papillary serous carcinoma   06/20/2012 Genetic Testing   [facility] testing done 11-2015 on surgical path from 2014:    MS stable   TMB low  4 muts/mb   ATM [id number]   ERBB3 T389K   E2H2 rearrangement exon 9   PPP2R1A P179R   TP53 I195N    06/20/2012 Initial Diagnosis   Patient presented to PCP with intermittent vaginal bleeding since ~ Oct 2013, endometrial biopsy 06-20-12 with complex endometrial  hyperplasia with atypia   06/26/2012 Imaging   Thickened endometrial lining in a postmenopausal patient experiencing vaginal bleeding. In the setting of post-menopausal bleeding, endometrial sampling is indicated to exclude carcinoma. No focal myometrial abnormalities are seen.  Normal left ovary and non-visualized right ovary   07/30/2012 Surgery   Dr. Polly Cobia performed robotic hysterectomy with bilateral salpingo-oophorectomy, bilateral pelvic lymph node dissection and periaortic lymph node dissection. Intraoperatively on frozen section, the patient was noted to have a large endometrial polyp with changes within the polyp consistent for high-grade malignancy, possibly papillary serous carcinoma. There is no obvious extrauterine disease noted.     07/30/2012 Pathology Results   [phone] SUPPLEMENTAL REPORT  THE ENDOMETRIAL CARCINOMA WAS ANALYZED FOR DNA MISMATCH REPAIR PROTEINS.  IMMUNOHISTOCHEMICALLY, THE NEOPLASM RETAINED NUCLEAR EXPRESSION OF 4 GENE PRODUCTS, MLH1, MSH2, MSH6, AND PMS2, INVOLVED IN DNA MISMATCH REPAIR.  POSITIVE AND NEGATIVE CONTROLS WORKED APPROPRIATELY.  PER REQUEST, AN ER AND PR ARE PERFORMED ON BLOCK 1I.  ER- APPROXIMATELY 25-35% STAINING IN NEOPLASTIC CELLS (INTERMEDIATE)  PR- APPROXIMATELY 25-35% STAINING IN NEOPLASTIC CELLS (STRONG)  ER AND PR PREDOMINANTLY SHOW STAINING IN  THE SEROUS COMPONENT.  DIAGNOSIS  1. UTERUS, CERVIX WITH BILATERAL FALLOPIAN TUBES AND OVARIES,  HYSTERECTOMY AND BILATERAL SALPINGO-OOPHORECTOMY:  HIGH GRADE/POORLY DIFFERENTIATED ENDOMETRIAL ADENOCARCINOMA WITH SOLID AND SEROUS PAPILLARY COMPONENTS.  HISTOPATHOLOGIC TYPE:    A VARIETY OF PATTERNS WERE PRESENT, ALL OF WHICH SHOULD BE CONSIDERED TO BE HIGH GRADE.  SEROUS PAPILLARY CARCINOMA WAS SEEN OCCURRING ADJACENT TO SOLID ENDOMETRIAL CARCINOMA WITH MARKED ANAPLASIA AND GIANT CELLS. SIZE: TUMOR MEASURED AT LEAST 4.8 CM IN GREATEST HORIZONTAL DIMENSION.  GRADE: POORLY DIFFERENTIATED OR  HIGH GRADE. DEPTH OF INVASION: NO DEFINITE MYOMETRIAL INVOLVEMENT WAS SEEN.  THE TUMOR APPEARED CONFINED TO THE POLYP AS WELL AS SURFACE ENDOMETRIUM. WHERE MYOMETRIAL THICKNESS NOT APPLICABLE. SEROSAL INVOLVEMENT:       NOT DEMONSTRATED.  ENDOCERVICAL INVOLVEMENT:  NOT DEMONSTRATED. RESECTION MARGINS:         FREE OF INVOLVEMENT. EXTRAUTERINE EXTENSION:    NOT DEMONSTRATED. ANGIOLYMPHATIC INVASION:   NOT DEFINITELY SEEN. TOTAL NODES EXAMINED:      22.    PELVIC NODES EXAMINED:  20.    PELVIC NODES INVOLVED:  0.    PARA-AORTIC NODES       2. EXAMINED:    PARA-AORTIC NODES       0. INVOLVED TNM STAGE:                 T1A N0 MX. AJCC STAGE GROUPING:       IA. FIGO STAGE:                IA.   08/26/2012 Imaging   No CT evidence for intra-abdominal or pelvic metastatic disease. Trace free pelvic fluid, presumably postoperative although the date of surgery is not documented in the electronic medical record.   08/26/2012 - 12/13/2012 Chemotherapy   She received 6 cycles of carbo/taxol    10/29/2012 - 11/27/2012 Radiation Therapy   May 27, June 5,  June 11, June 19, November 27, 2012: Proximal vagina 30 Gy in 5 fractions      01/17/2013 Imaging   1.  No evidence of recurrent or metastatic disease. 2.  No acute abnormality involving the abdomen or pelvis. 3.  Mild diffuse hepatic steatosis. 4.  Very small supraumbilical midline anterior abdominal wall hernia containing fat, unchanged   01/22/2014 Imaging   No evidence for metastatic or recurrent disease. 2. No bowel obstruction.  Normal appendix. 3. Small fat containing hernia, stable in appearance. 4. Status post hysterectomy and bilateral oophorectomy   02/19/2014 Imaging   No pulmonary lesions are identified. The abnormality on the chest x-ray is due to asymmetric left-sided sternoclavicular joint degenerative disease   10/12/2014 Imaging   New mild retroperitoneal lymphadenopathy in the left paraaortic region and proximal left common iliac  chain, consistent with metastatic disease. No other sites of metastatic disease identified within the abdomen or pelvis.       11/27/2014 - 02/11/2015 Chemotherapy   She received 4 cycles of carbo/taxol    03/01/2015 PET scan   Single hypermetabolic small retroperitoneal lymph node along the aorta. 2. No evidence of metastatic disease otherwise in the abdomen or pelvis. No evidence local recurrence. 3. Intensely hypermetabolic enlarged nodule adjacent to the RIGHT lobe of thyroid gland. This presumably represents the biopsied lesion in clinician report which was found to be benign thyroid tissue.   04/05/2015 - 05/13/2015 Radiation Therapy   She received 50.4 gray in 28 fractions with simultaneous integrated boost to 56 gray   06/17/2015 Imaging   No acute process or evidence of  metastatic disease in the abdomen or pelvis. Resolution of previously described retroperitoneal adenopathy. 2.  Possible constipation. 3. Atherosclerosis.   06/17/2015 Tumor Marker   Patient's tumor was tested for the following markers: CA125 Results of the tumor marker test revealed 33   08/12/2015 Tumor Marker   Patient's tumor was tested for the following markers: CA125 Results of the tumor marker test revealed 52   08/31/2015 PET scan   Development of right paratracheal hypermetabolic adenopathy, consistent with nodal metastasis. 2. The previously described isolated abdominal retroperitoneal hypermetabolic node has resolved. 3. Persistent hypermetabolic right thyroid nodule, per report previously biopsied. Correlate with those results. 4.  Possible constipation.   09/09/2015 -  Anti-estrogen oral therapy   She has been receiving alternative treatment between megace and tamoxifen   09/09/2015 Tumor Marker   Patient's tumor was tested for the following markers: CA125 Results of the tumor marker test revealed 37.8   09/20/2015 Procedure   Technically successful ultrasound-guided thyroid aspiration biopsy , dominant  right nodule   09/20/2015 Pathology Results   THYROID, RIGHT, FINE NEEDLE ASPIRATION (SPECIMEN 1 OF 1 COLLECTED 09/20/15): FINDINGS CONSISTENT WITH BENIGN THYROID NODULE (BETHESDA CATEGORY II).   10/28/2015 Tumor Marker   Patient's tumor was tested for the following markers: CA125 Results of the tumor marker test revealed 53.2   11/04/2015 Imaging   Stable benign right thyroid nodule   12/16/2015 Tumor Marker   Patient's tumor was tested for the following markers: CA125 Results of the tumor marker test revealed 38.1   03/22/2016 PET scan   Interval progression of hypermetabolic right paratracheal lymph node consistent with metastatic involvement. 2. Stable hypermetabolic right thyroid nodule. Reportedly this has been biopsied in the past.   03/23/2016 Tumor Marker   Patient's tumor was tested for the following markers: CA125 Results of the tumor marker test revealed 62.4   04/13/2016 - 06/01/2016 Radiation Therapy   She received 56 Gy to the chest in 28 fractions    05/09/2016 Imaging   No evidence of left lower extremity deep vein thrombosis. No evidence of a superficial thrombosis of the greater and lesser saphenous veins. Positive for thrombus noted in several varicosities of the calf. No evidence of Baker's cyst on the left.   05/17/2016 Tumor Marker   Patient's tumor was tested for the following markers: CA125 Results of the tumor marker test revealed 9   06/29/2016 Tumor Marker   Patient's tumor was tested for the following markers: CA125 Results of the tumor marker test revealed 5.9   08/04/2016 Tumor Marker   Patient's tumor was tested for the following markers: CA125 Results of the tumor marker test revealed 6.0   09/12/2016 PET scan   Complete metabolic response to therapy, with resolution of hypermetabolic mediastinal lymphadenopathy since prior exam. No residual or new metastatic disease identified. Stable hypermetabolic right thyroid lobe nodule, which was previously  biopsied on 09/20/2015.   03/13/2017 PET scan   1. Solitary focus of recurrent right paratracheal hypermetabolic activity, with a 1.0 cm right lower paratracheal node having a maximum SUV of 9.0. Appearance compatible with recurrent malignancy. 2. Continued hypermetabolic right thyroid nodule, previously biopsied, presumed benign -correlate with prior biopsy results. 3. Other imaging findings of potential clinical significance: Aortic Atherosclerosis (ICD10-I70.0). Mild cardiomegaly. Prominent stool throughout the colon favors constipation.   04/02/2017 Pathology Results   FINE NEEDLE ASPIRATION, ENDOSCOPIC, (EBUS) 4 R NODE (SPECIMEN 1 OF 2 COLLECTED 04/02/17): MALIGNANT CELLS PRESENT, CONSISTENT WITH CARCINOMA. SEE COMMENT. COMMENT: THE MALIGNANT CELLS ARE  POSITIVE FOR P53 AND NEGATIVE FOR ESTROGEN RECEPTOR AND TTF-1. THIS PROFILE IS NON-SPECIFIC, BUT THE P53 POSITIVE STAINING IS SUGGESTIVE OF GYNECOLOGIC PRIMARY.    04/11/2017 Procedure   Successful 8 French right internal jugular vein power port placement with its tip at the SVC/RA junction   04/12/2017 Imaging   Normal LV size with mild LV hypertrophy. EF 55-60%. Normal RV size and systolic function. Aortic valve sclerosis without significant stenosis.   04/18/2017 - 06/14/2017 Chemotherapy   The patient had chemotherapy with Doxil    07/10/2017 Imaging   - Left ventricle: The cavity size was normal. There was mild concentric hypertrophy. Systolic function was normal. The estimated ejection fraction was in the range of 55% to 60%. Wall motion was normal; there were no regional wall motion abnormalities. Doppler parameters are consistent with abnormal left ventricular relaxation (grade 1 diastolic dysfunction). - Aortic valve: Noncoronary cusp mobility was mildly restricted. - Mitral valve: There was mild regurgitation. - Left atrium: The atrium was mildly dilated. - Atrial septum: No defect or patent foramen ovale was  identified.  Impressions:  - Compared to November 2018, global LV longitudinal strain remains normal (has increased)   07/11/2017 PET scan   Increased size and hypermetabolic activity of 3.3 cm right thyroid lobe nodule. Thyroid carcinoma cannot be excluded. Recommend repeat ultrasound guided fine needle aspiration to exclude thyroid carcinoma.  New adjacent hypermetabolic 10 mm right supraclavicular lymph node, suspicious for lymph node metastasis.  Slight increase in size and hypermetabolic activity of solitary right paratracheal lymph node.  No evidence of abdominal or pelvic metastatic disease.   07/18/2017 Procedure   1. Technically successful ultrasound guided fine needle aspiration of indeterminate hypermetabolic right-sided thyroid nodule/mass. 2. Technically successful ultrasound-guided core needle biopsy of hypermetabolic right lower cervical lymph node.   07/18/2017 Pathology Results   THYROID, FINE NEEDLE ASPIRATION RIGHT (SPECIMEN 1 OF 1, COLLECTED ON 07/18/2017): ATYPIA OF UNDETERMINED SIGNIFICANCE OR FOLLICULAR LESION OF UNDETERMINED SIGNIFICANCE (BETHESDA CATEGORY III). SEE COMMENT. COMMENT: THE SPECIMEN CONSISTS OF SMALL AND MEDIUM SIZED GROUPS OF FOLLICULAR EPITHELIAL CELLS WITH MILD CYTOLOGIC ATYPIA INCLUDING NUCLEAR ENLARGEMENT AND HURTHLE CELL CHANGE. SOME GROUPS ARE ARRANGED AS MICROFOLLICLES. THERE IS MINIMAL BACKGROUND COLLOID. BASED ON THESE FEATURES, A FOLLICULAR LESION/NEOPLASM CAN NOT BE ENTIRELY RULED OUT. A SPECIMEN WILL BE SENT FOR AFIRMA TESTING.   07/19/2017 Pathology Results   Lymph node, needle/core biopsy - METASTATIC PAPILLARY SEROUS CARCINOMA. - SEE COMMENT. Microscopic Comment Dr. Vicente Males has reviewed the case and concurs with this interpretation   10/15/2017 PET scan   No new or progressive disease. No evidence of abdominal or pelvic metastatic disease.  Stable hypermetabolic right thyroid lobe nodule and adjacent right supraclavicular lymph  node.  Decreased size and hypermetabolic activity of solitary right paratracheal lymph node.   01/23/2018 PET scan   1. Overall, no significant change from PET-CT of 3 months ago. There is persistent hypermetabolic activity at the right thoracic inlet and within a right paratracheal mediastinal node. The nodes have not significantly changed in size, although the metabolic activity has minimally increased over this interval. It is uncertain how much of the thoracic inlet metabolic activity is attributed to the thyroid nodule versus adjacent cervical lymph nodes, both previously biopsied. 2. No new hypermetabolic activity within the neck, chest, abdomen or pelvis. No evidence of local recurrence in the pelvis. 3. Stable probable radiation changes in the right lung.   04/16/2018 PET scan   1. Interval increase in metabolic activity of the  RIGHT supraclavicular node and RIGHT lower paratracheal mediastinal node with minimal change in size. Findings consistent with persistent and mildly progressed metastatic adenopathy. 2. New hypermetabolic small lymph node in the RIGHT lower paratracheal nodal station adjacent to the previously followed node. 3. No evidence of local recurrence in the pelvis or new disease elsewhere.   07/30/2018 PET scan   1. Mild interval progression of right supraclavicular, mediastinal, and right hilar hypermetabolic metastatic disease. There is a new hypermetabolic lymph node in the subcarinal station on today's study. 2. No hypermetabolic disease in the abdomen or pelvis to suggest recurrent/metastases. 3.  Aortic Atherosclerois (ICD10-170.0)     08/16/2018 -  Chemotherapy   The patient had Lenvima since 3/6 and pembrolizumab since 3/13 for chemotherapy treatment. Michel Santee was held temporarily due to infection and severe hypertension   10/31/2018 PET scan   Partial response to therapy, with decreased hypermetabolic lymphadenopathy in mediastinum and right hilar region.   No  significant change in hypermetabolic lymphadenopathy in right inferior neck.   No new or progressive metastatic disease identified. No evidence of recurrent or metastatic carcinoma in the pelvis or abdomen   02/04/2019 PET scan   1. Decrease in metabolic activity of RIGHT supraclavicular node and RIGHT paratracheal lymph nodes. 2. Persistent activity in the RIGHT thyroid nodule unchanged. 3. Diffuse activity through the esophagus is favored esophagitis. No change. 4. Scattered foci of intense metabolic activity associated the bowel without focal lesion on CT favored benign. 5. No evidence of peritoneal metastasis. 6. No evidence of metastatic adenopathy in the abdomen pelvis. 7. No evidence of local pelvic sidewall recurrence.   07/30/2019 PET scan   1. Mildly increased uptake within small nodes along the right sternocleidomastoid without change in size and associated with increased deltoid activity may reflect changes related to right arm injection or recent COVID vaccination, consider clinical correlation and attention on follow-up. 2. Persistent activity in the right thyroid nodule, unchanged from previous exams. 3. New activity in a juxta esophageal lymph node in the chest is suspicious though the node is unchanged with respect to size. Endoscopic correlation could potentially be helpful or close attention on follow-up. 4. Subtle increased FDG uptake along the left mainstem bronchus and adjacent to the aorta may represent a small lymph node. No discrete correlate is demonstrated on today's study.   10/22/2019 PET scan   1. Persistent FDG avid subcentimeter right supraclavicular and juxta esophageal lymph nodes. The degree of FDG uptake is similar to the previous exam. No new or progressive findings identified. 2. No findings of solid organ metastasis, skeletal metastasis or metastatic disease to the abdomen or pelvis. 3. FDG avid right lobe of thyroid gland nodule is again noted This nodule is  not incidental and been worked up previously with 2 biopsies. Please refer to results from the most recent biopsy dated 07/18/2017. (Ref: J Am Coll Radiol. 2015 Feb;12(2): 143-50). 4.  Aortic Atherosclerosis (ICD10-I70.0).   05/05/2020 PET scan   1. Progressive disease, as evidenced by increased hypermetabolism within right supraclavicular and mediastinal nodes. 2. No infradiaphragmatic hypermetabolic metastasis. 3.  Aortic Atherosclerosis (ICD10-I70.0). 4. Hypermetabolic right thyroid nodule has been detailed on prior exams and was biopsied on 07/18/2017   09/10/2020 PET scan   1. Interval improvement in previously demonstrated hypermetabolic mediastinal and right supraclavicular adenopathy. A single hypermetabolic left paraesophageal lymph node remains, improved from previous study. The other nodes have resolved.  2. No progressive disease. 3. Diffuse esophageal hypermetabolic activity suggesting esophagitis. 4. Hypermetabolic right  thyroid nodule has been previously biopsied.   03/18/2021 PET scan   1. No significant change in single residual FDG avid left paraesophageal lymph node. This has an SUV max of 7.03, compared with 6.4 previously. No new sites of disease identified. 2. Diffuse esophageal hypermetabolic activity suggestive of esophagitis. 3. FDG avid right lobe of thyroid gland nodule. This has been biopsied previously. (Ref: J Am Coll Radiol. 2015 Feb;12(2): 143-50).     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 Lenvima was held temporarily due to infection and severe hypertension     PHYSICAL EXAMINATION: ECOG PERFORMANCE STATUS: 0 - Asymptomatic  Vitals:   03/18/21 1027  BP: 125/72  Pulse: 64  Resp: 18  SpO2: 98%   Filed Weights   03/18/21 1027  Weight: 153 lb  (69.4 kg)    GENERAL:alert, no distress and comfortable SKIN: skin color, texture, turgor are normal, no rashes or significant lesions EYES: normal, Conjunctiva are pink and non-injected, sclera clear OROPHARYNX:no exudate, no erythema and lips, buccal mucosa, and tongue normal  NECK: supple, thyroid normal size, non-tender, without nodularity LYMPH:  no palpable lymphadenopathy in the cervical, axillary or inguinal LUNGS: clear to auscultation and percussion with normal breathing effort HEART: regular rate & rhythm and no murmurs and no lower extremity edema ABDOMEN:abdomen soft, non-tender and normal bowel sounds Musculoskeletal:no cyanosis of digits and no clubbing  NEURO: alert & oriented x 3 with fluent speech, no focal motor/sensory deficits  LABORATORY DATA:  I have reviewed the data as listed    Component Value Date/Time   NA 136 02/25/2021 0833   NA 141 05/17/2017 0957   K 4.2 02/25/2021 0833   K 3.6 05/17/2017 0957   CL 100 02/25/2021 0833   CL 105 11/19/2012 0848   CO2 26 02/25/2021 0833   CO2 25 05/17/2017 0957   GLUCOSE 97 02/25/2021 0833   GLUCOSE 85 05/17/2017 0957   GLUCOSE 122 (H) 11/19/2012 0848   BUN 18 02/25/2021 0833   BUN 15.2 05/17/2017 0957   CREATININE 0.83 02/25/2021 0833   CREATININE 0.86 12/23/2020 1118   CREATININE 0.8 05/17/2017 0957   CALCIUM 10.0 02/25/2021 0833   CALCIUM 9.6 05/17/2017 0957   PROT 7.4 02/25/2021 0833   PROT 7.0 05/17/2017 0957   ALBUMIN 4.0 02/25/2021 0833   ALBUMIN 3.9 05/17/2017 0957   AST 24 02/25/2021 0833   AST 27 12/23/2020 1118   AST 25 05/17/2017 0957   ALT 17 02/25/2021 0833   ALT 20 12/23/2020 1118   ALT 18 05/17/2017 0957   ALKPHOS 53 02/25/2021 0833   ALKPHOS 57 05/17/2017 0957   BILITOT 0.5 02/25/2021 0833   BILITOT 0.8 12/23/2020 1118   BILITOT 0.47 05/17/2017 0957   GFRNONAA >60 02/25/2021 0833   GFRNONAA >60 12/23/2020 1118   GFRAA >60 02/26/2020 0940   GFRAA >60 07/10/2019 1240    No results  found for: SPEP, UPEP  Lab Results  Component Value Date   WBC 3.6 (L) 03/18/2021   NEUTROABS 2.0 03/18/2021   HGB 10.3 (L) 03/18/2021   HCT 31.0 (L) 03/18/2021   MCV 95.1 03/18/2021   PLT 182 03/18/2021      Chemistry      Component Value Date/Time   NA 136 02/25/2021 0833   NA 141 05/17/2017 0957  K 4.2 02/25/2021 0833   K 3.6 05/17/2017 0957   CL 100 02/25/2021 0833   CL 105 11/19/2012 0848   CO2 26 02/25/2021 0833   CO2 25 05/17/2017 0957   BUN 18 02/25/2021 0833   BUN 15.2 05/17/2017 0957   CREATININE 0.83 02/25/2021 0833   CREATININE 0.86 12/23/2020 1118   CREATININE 0.8 05/17/2017 0957   GLU 158 (H) 01/14/2015 1035      Component Value Date/Time   CALCIUM 10.0 02/25/2021 0833   CALCIUM 9.6 05/17/2017 0957   ALKPHOS 53 02/25/2021 0833   ALKPHOS 57 05/17/2017 0957   AST 24 02/25/2021 0833   AST 27 12/23/2020 1118   AST 25 05/17/2017 0957   ALT 17 02/25/2021 0833   ALT 20 12/23/2020 1118   ALT 18 05/17/2017 0957   BILITOT 0.5 02/25/2021 0833   BILITOT 0.8 12/23/2020 1118   BILITOT 0.47 05/17/2017 0957       RADIOGRAPHIC STUDIES: I have reviewed multiple imaging studies with the patient I have personally reviewed the radiological images as listed and agreed with the findings in the report. NM PET Image Restage (PS) Skull Base to Thigh (F-18 FDG)  Result Date: 03/18/2021 CLINICAL DATA:  Subsequent treatment strategy for uterine/cervical cancer. EXAM: NUCLEAR MEDICINE PET SKULL BASE TO THIGH TECHNIQUE: 7.89 mCi F-18 FDG was injected intravenously. Full-ring PET imaging was performed from the skull base to thigh after the radiotracer. CT data was obtained and used for attenuation correction and anatomic localization. Fasting blood glucose: 88 mg/dl COMPARISON:  09/09/2020 FINDINGS: Mediastinal blood pool activity: SUV max 2.51 Liver activity: SUV max NA NECK: Right lobe of thyroid gland nodule is FDG avid measuring 1.6 cm with SUV max 13.11, image 42/4. Similar  to previous exam. Incidental CT findings: none CHEST: Previous paraesophageal lymph node posterior to the left atrium measures 5 mm with SUV max of 7.03, image 69/4. Formally 7 mm with SUV max of 6.4. No additional FDG avid mediastinal lymph nodes. No FDG avid axillary, supraclavicular, or hilar lymph nodes. Similar appearance of diffuse FDG uptake within the esophagus. Paramediastinal fibrosis within the right upper lobe likely reflects changes secondary to external beam radiation. No FDG avid pulmonary nodule or mass. Incidental CT findings: Aortic atherosclerosis. ABDOMEN/PELVIS: No abnormal hypermetabolic activity within the liver, pancreas, adrenal glands, or spleen. No hypermetabolic lymph nodes in the abdomen or pelvis. Incidental CT findings: none SKELETON: No focal hypermetabolic activity to suggest skeletal metastasis. Incidental CT findings: none IMPRESSION: 1. No significant change in single residual FDG avid left paraesophageal lymph node. This has an SUV max of 7.03, compared with 6.4 previously. No new sites of disease identified. 2. Diffuse esophageal hypermetabolic activity suggestive of esophagitis. 3. FDG avid right lobe of thyroid gland nodule. This has been biopsied previously. (Ref: J Am Coll Radiol. 2015 Feb;12(2): 143-50). Electronically Signed   By: Kerby Moors M.D.   On: 03/18/2021 10:47

## 2021-03-18 NOTE — Assessment & Plan Note (Signed)
She had previous biopsy done on the right thyroid nodule that came back nonmalignant Continue observation

## 2021-03-18 NOTE — Patient Instructions (Signed)
[facility]  Discharge Instructions: ?Thank you for choosing [facility] to provide your oncology and hematology care.  ? ?If you have a lab appointment with the Cancer Center, please go directly to the Cancer Center and check in at the registration area. ?  ?Wear comfortable clothing and clothing appropriate for easy access to any Portacath or PICC line.  ? ?We strive to give you quality time with your provider. You may need to reschedule your appointment if you arrive late (15 or more minutes).  Arriving late affects you and other patients whose appointments are after yours.  Also, if you miss three or more appointments without notifying the office, you may be dismissed from the clinic at the provider?s discretion.    ?  ?For prescription refill requests, have your pharmacy contact our office and allow 72 hours for refills to be completed.   ? ?Today you received the following chemotherapy and/or immunotherapy agents: Keytruda ?  ?To help prevent nausea and vomiting after your treatment, we encourage you to take your nausea medication as directed. ? ?BELOW ARE SYMPTOMS THAT SHOULD BE REPORTED IMMEDIATELY: ?*FEVER GREATER THAN 100.4 F (38 ?C) OR HIGHER ?*CHILLS OR SWEATING ?*NAUSEA AND VOMITING THAT IS NOT CONTROLLED WITH YOUR NAUSEA MEDICATION ?*UNUSUAL SHORTNESS OF BREATH ?*UNUSUAL BRUISING OR BLEEDING ?*URINARY PROBLEMS (pain or burning when urinating, or frequent urination) ?*BOWEL PROBLEMS (unusual diarrhea, constipation, pain near the anus) ?TENDERNESS IN MOUTH AND THROAT WITH OR WITHOUT PRESENCE OF ULCERS (sore throat, sores in mouth, or a toothache) ?UNUSUAL RASH, SWELLING OR PAIN  ?UNUSUAL VAGINAL DISCHARGE OR ITCHING  ? ?Items with * indicate a potential emergency and should be followed up as soon as possible or go to the Emergency Department if any problems should occur. ? ?Please show the CHEMOTHERAPY ALERT CARD or IMMUNOTHERAPY ALERT CARD at check-in to the  Emergency Department and triage nurse. ? ?Should you have questions after your visit or need to cancel or reschedule your appointment, please contact [facility]  Dept: [phone]  and follow the prompts.  Office hours are 8:00 a.m. to 4:30 p.m. Monday - Friday. Please note that voicemails left after 4:00 p.m. may not be returned until the following business day.  We are closed weekends and major holidays. You have access to a nurse at all times for urgent questions. Please call the main number to the clinic Dept: [phone] and follow the prompts. ? ? ?For any non-urgent questions, you may also contact your provider using MyChart. We now offer e-Visits for anyone 18 and older to request care online for non-urgent symptoms. For details visit [email or web address]. ?  ?Also download the MyChart app! Go to the app store, search "MyChart", open the app, select [facility], and log in with your MyChart username and password. ? ?Due to Covid, a mask is required upon entering the hospital/clinic. If you do not have a mask, one will be given to you upon arrival. For doctor visits, patients may have 1 support person aged 18 or older with them. For treatment visits, patients cannot have anyone with them due to current Covid guidelines and our immunocompromised population.  ? ?

## 2021-03-18 NOTE — Assessment & Plan Note (Signed)
This is due to side effects of treatment She is not symptomatic Observe only 

## 2021-03-18 NOTE — Assessment & Plan Note (Signed)
I have reviewed PET CT imaging with the patient Overall, she have no signs of cancer progression We will continue current treatment with pembrolizumab/Lenvima indefinitely I will space out her next imaging study to 6 months, due in April 2023

## 2021-03-18 NOTE — Progress Notes (Signed)
Per Dr. Alvy Bimler, ok to proceed with treatment without CMP results.

## 2021-03-18 NOTE — Assessment & Plan Note (Signed)
She has intermittent elevated blood pressure I refill her prescriptions today

## 2021-03-22 ENCOUNTER — Ambulatory Visit: Payer: Self-pay

## 2021-03-23 ENCOUNTER — Other Ambulatory Visit (HOSPITAL_COMMUNITY): Payer: Self-pay

## 2021-03-23 MED FILL — Lenvatinib Cap Therapy Pack 10 MG (10 MG Daily Dose): ORAL | 30 days supply | Qty: 30 | Fill #5 | Status: AC

## 2021-03-24 ENCOUNTER — Other Ambulatory Visit (HOSPITAL_COMMUNITY): Payer: Self-pay

## 2021-03-24 DIAGNOSIS — E041 Nontoxic single thyroid nodule: Secondary | ICD-10-CM | POA: Diagnosis not present

## 2021-03-24 DIAGNOSIS — M8580 Other specified disorders of bone density and structure, unspecified site: Secondary | ICD-10-CM | POA: Diagnosis not present

## 2021-03-24 DIAGNOSIS — I10 Essential (primary) hypertension: Secondary | ICD-10-CM | POA: Diagnosis not present

## 2021-03-24 DIAGNOSIS — C541 Malignant neoplasm of endometrium: Secondary | ICD-10-CM | POA: Diagnosis not present

## 2021-03-24 DIAGNOSIS — D61810 Antineoplastic chemotherapy induced pancytopenia: Secondary | ICD-10-CM | POA: Diagnosis not present

## 2021-03-24 DIAGNOSIS — K219 Gastro-esophageal reflux disease without esophagitis: Secondary | ICD-10-CM | POA: Diagnosis not present

## 2021-03-24 DIAGNOSIS — E785 Hyperlipidemia, unspecified: Secondary | ICD-10-CM | POA: Diagnosis not present

## 2021-03-24 DIAGNOSIS — I700 Atherosclerosis of aorta: Secondary | ICD-10-CM | POA: Diagnosis not present

## 2021-03-24 MED ORDER — LEVOTHYROXINE SODIUM 112 MCG PO TABS
ORAL_TABLET | ORAL | 3 refills | Status: DC
  Filled 2021-03-24: qty 90, 90d supply, fill #0
  Filled 2021-07-14: qty 90, 90d supply, fill #1
  Filled 2021-10-11: qty 90, 90d supply, fill #2
  Filled 2022-01-08: qty 90, 90d supply, fill #3

## 2021-04-07 ENCOUNTER — Other Ambulatory Visit (HOSPITAL_COMMUNITY): Payer: Self-pay

## 2021-04-07 MED ORDER — SIMVASTATIN 20 MG PO TABS
ORAL_TABLET | ORAL | 1 refills | Status: DC
  Filled 2021-04-07: qty 90, 90d supply, fill #0
  Filled 2021-07-14: qty 90, 90d supply, fill #1

## 2021-04-07 MED FILL — Losartan Potassium Tab 25 MG: ORAL | 90 days supply | Qty: 90 | Fill #2 | Status: AC

## 2021-04-08 ENCOUNTER — Other Ambulatory Visit: Payer: Self-pay

## 2021-04-08 ENCOUNTER — Inpatient Hospital Stay: Payer: 59

## 2021-04-08 ENCOUNTER — Inpatient Hospital Stay: Payer: 59 | Attending: Hematology and Oncology

## 2021-04-08 VITALS — BP 122/66 | HR 65 | Temp 97.9°F | Resp 16

## 2021-04-08 DIAGNOSIS — C770 Secondary and unspecified malignant neoplasm of lymph nodes of head, face and neck: Secondary | ICD-10-CM

## 2021-04-08 DIAGNOSIS — Z7189 Other specified counseling: Secondary | ICD-10-CM

## 2021-04-08 DIAGNOSIS — Z5112 Encounter for antineoplastic immunotherapy: Secondary | ICD-10-CM | POA: Diagnosis not present

## 2021-04-08 DIAGNOSIS — Z95828 Presence of other vascular implants and grafts: Secondary | ICD-10-CM

## 2021-04-08 DIAGNOSIS — C541 Malignant neoplasm of endometrium: Secondary | ICD-10-CM | POA: Insufficient documentation

## 2021-04-08 DIAGNOSIS — E039 Hypothyroidism, unspecified: Secondary | ICD-10-CM

## 2021-04-08 LAB — COMPREHENSIVE METABOLIC PANEL
ALT: 18 U/L (ref 0–44)
AST: 24 U/L (ref 15–41)
Albumin: 3.9 g/dL (ref 3.5–5.0)
Alkaline Phosphatase: 50 U/L (ref 38–126)
Anion gap: 11 (ref 5–15)
BUN: 15 mg/dL (ref 8–23)
CO2: 27 mmol/L (ref 22–32)
Calcium: 9.6 mg/dL (ref 8.9–10.3)
Chloride: 102 mmol/L (ref 98–111)
Creatinine, Ser: 0.8 mg/dL (ref 0.44–1.00)
GFR, Estimated: >60 mL/min (ref >60)
Glucose, Bld: 100 mg/dL — ABNORMAL HIGH (ref 70–99)
Potassium: 4.1 mmol/L (ref 3.5–5.1)
Sodium: 140 mmol/L (ref 135–145)
Total Bilirubin: 0.4 mg/dL (ref 0.3–1.2)
Total Protein: 7.3 g/dL (ref 6.5–8.1)

## 2021-04-08 LAB — CBC WITH DIFFERENTIAL/PLATELET
Abs Immature Granulocytes: 0.01 10*3/uL (ref 0.00–0.07)
Basophils Absolute: 0 10*3/uL (ref 0.0–0.1)
Basophils Relative: 1 %
Eosinophils Absolute: 0.3 10*3/uL (ref 0.0–0.5)
Eosinophils Relative: 7 %
HCT: 31 % — ABNORMAL LOW (ref 36.0–46.0)
Hemoglobin: 10.2 g/dL — ABNORMAL LOW (ref 12.0–15.0)
Immature Granulocytes: 0 %
Lymphocytes Relative: 18 %
Lymphs Abs: 0.6 10*3/uL — ABNORMAL LOW (ref 0.7–4.0)
MCH: 31.6 pg (ref 26.0–34.0)
MCHC: 32.9 g/dL (ref 30.0–36.0)
MCV: 96 fL (ref 80.0–100.0)
Monocytes Absolute: 0.3 10*3/uL (ref 0.1–1.0)
Monocytes Relative: 8 %
Neutro Abs: 2.4 10*3/uL (ref 1.7–7.7)
Neutrophils Relative %: 66 %
Platelets: 160 10*3/uL (ref 150–400)
RBC: 3.23 MIL/uL — ABNORMAL LOW (ref 3.87–5.11)
RDW: 13.6 % (ref 11.5–15.5)
WBC: 3.7 10*3/uL — ABNORMAL LOW (ref 4.0–10.5)
nRBC: 0 % (ref 0.0–0.2)

## 2021-04-08 LAB — TOTAL PROTEIN, URINE DIPSTICK: Protein, ur: 300 mg/dL — AB

## 2021-04-08 LAB — TSH: TSH: 2.829 u[IU]/mL (ref 0.308–3.960)

## 2021-04-08 MED ORDER — SODIUM CHLORIDE 0.9% FLUSH
10.0000 mL | INTRAVENOUS | Status: DC | PRN
  Administered 2021-04-08: 10 mL

## 2021-04-08 MED ORDER — SODIUM CHLORIDE 0.9 % IV SOLN
200.0000 mg | Freq: Once | INTRAVENOUS | Status: AC
  Administered 2021-04-08: 200 mg via INTRAVENOUS
  Filled 2021-04-08: qty 8

## 2021-04-08 MED ORDER — SODIUM CHLORIDE 0.9% FLUSH
10.0000 mL | Freq: Once | INTRAVENOUS | Status: AC
  Administered 2021-04-08: 10 mL

## 2021-04-08 MED ORDER — HEPARIN SOD (PORK) LOCK FLUSH 100 UNIT/ML IV SOLN
500.0000 [IU] | Freq: Once | INTRAVENOUS | Status: AC | PRN
  Administered 2021-04-08: 500 [IU]

## 2021-04-08 MED ORDER — SODIUM CHLORIDE 0.9 % IV SOLN: Freq: Once | INTRAVENOUS | Status: AC

## 2021-04-08 NOTE — Progress Notes (Signed)
Per Dr. Alvy Bimler, her urine protein came back very high. Her BP is not optimally controlled. I recommend hold Lenvima for 1 week. and to start checking BP consistently. We will call next week to check on her. Given message to infusion nurse who gave to [facility]. She verbalized understanding.

## 2021-04-08 NOTE — Patient Instructions (Signed)
[facility]   ?Discharge Instructions: ?Thank you for choosing [facility] to provide your oncology and hematology care.  ? ?If you have a lab appointment with the Cancer Center, please go directly to the Cancer Center and check in at the registration area. ?  ?Wear comfortable clothing and clothing appropriate for easy access to any Portacath or PICC line.  ? ?We strive to give you quality time with your provider. You may need to reschedule your appointment if you arrive late (15 or more minutes).  Arriving late affects you and other patients whose appointments are after yours.  Also, if you miss three or more appointments without notifying the office, you may be dismissed from the clinic at the provider?s discretion.    ?  ?For prescription refill requests, have your pharmacy contact our office and allow 72 hours for refills to be completed.   ? ?Today you received the following chemotherapy and/or immunotherapy agents: pembrolizumab    ?  ?To help prevent nausea and vomiting after your treatment, we encourage you to take your nausea medication as directed. ? ?BELOW ARE SYMPTOMS THAT SHOULD BE REPORTED IMMEDIATELY: ?*FEVER GREATER THAN 100.4 F (38 ?C) OR HIGHER ?*CHILLS OR SWEATING ?*NAUSEA AND VOMITING THAT IS NOT CONTROLLED WITH YOUR NAUSEA MEDICATION ?*UNUSUAL SHORTNESS OF BREATH ?*UNUSUAL BRUISING OR BLEEDING ?*URINARY PROBLEMS (pain or burning when urinating, or frequent urination) ?*BOWEL PROBLEMS (unusual diarrhea, constipation, pain near the anus) ?TENDERNESS IN MOUTH AND THROAT WITH OR WITHOUT PRESENCE OF ULCERS (sore throat, sores in mouth, or a toothache) ?UNUSUAL RASH, SWELLING OR PAIN  ?UNUSUAL VAGINAL DISCHARGE OR ITCHING  ? ?Items with * indicate a potential emergency and should be followed up as soon as possible or go to the Emergency Department if any problems should occur. ? ?Please show the CHEMOTHERAPY ALERT CARD or IMMUNOTHERAPY ALERT CARD at  check-in to the Emergency Department and triage nurse. ? ?Should you have questions after your visit or need to cancel or reschedule your appointment, please contact [facility]  Dept: [phone]  and follow the prompts.  Office hours are 8:00 a.m. to 4:30 p.m. Monday - Friday. Please note that voicemails left after 4:00 p.m. may not be returned until the following business day.  We are closed weekends and major holidays. You have access to a nurse at all times for urgent questions. Please call the main number to the clinic Dept: [phone] and follow the prompts. ? ? ?For any non-urgent questions, you may also contact your provider using MyChart. We now offer e-Visits for anyone 18 and older to request care online for non-urgent symptoms. For details visit [email or web address]. ?  ?Also download the MyChart app! Go to the app store, search "MyChart", open the app, select [facility], and log in with your MyChart username and password. ? ?Due to Covid, a mask is required upon entering the hospital/clinic. If you do not have a mask, one will be given to you upon arrival. For doctor visits, patients may have 1 support person aged 18 or older with them. For treatment visits, patients cannot have anyone with them due to current Covid guidelines and our immunocompromised population.  ? ?

## 2021-04-15 ENCOUNTER — Telehealth: Payer: Self-pay

## 2021-04-15 NOTE — Telephone Encounter (Signed)
Checked on patient regarding her blood pressure trends. Patient states that her blood pressure has been fluctuating between systolic of 90 to systolic of 431. Patient states that most recent BP was 120/70. Patient denies any recent dizziness or lightheadedness and that she continues to take her BP medication as directed. Patient is checking BP daily and maintains her typical fluid consumption.  Patient had questions regarding restarting Lenvima medication. After clarification from Dr. Alvy Bimler, patient has been advised to restart on Lenvima medication and to hold her Norvasc or diuretic, if her systolic number is ever less than 100. Patient reiterated and verbalized an understanding of this information. Patient denied having any further questions at this time.

## 2021-04-19 ENCOUNTER — Other Ambulatory Visit (HOSPITAL_COMMUNITY): Payer: Self-pay

## 2021-04-19 MED FILL — Lenvatinib Cap Therapy Pack 10 MG (10 MG Daily Dose): ORAL | 30 days supply | Qty: 30 | Fill #6 | Status: AC

## 2021-04-26 ENCOUNTER — Other Ambulatory Visit (HOSPITAL_COMMUNITY): Payer: Self-pay

## 2021-04-29 ENCOUNTER — Telehealth: Payer: Self-pay

## 2021-04-29 ENCOUNTER — Encounter: Payer: Self-pay | Admitting: Hematology and Oncology

## 2021-04-29 ENCOUNTER — Inpatient Hospital Stay: Payer: 59

## 2021-04-29 ENCOUNTER — Inpatient Hospital Stay (HOSPITAL_BASED_OUTPATIENT_CLINIC_OR_DEPARTMENT_OTHER): Payer: 59 | Admitting: Hematology and Oncology

## 2021-04-29 ENCOUNTER — Other Ambulatory Visit: Payer: Self-pay

## 2021-04-29 ENCOUNTER — Other Ambulatory Visit: Payer: Self-pay | Admitting: *Deleted

## 2021-04-29 VITALS — BP 141/73 | HR 71 | Temp 97.7°F | Resp 16 | Ht 64.0 in | Wt 150.6 lb

## 2021-04-29 DIAGNOSIS — R300 Dysuria: Secondary | ICD-10-CM

## 2021-04-29 DIAGNOSIS — D61818 Other pancytopenia: Secondary | ICD-10-CM | POA: Diagnosis not present

## 2021-04-29 DIAGNOSIS — C541 Malignant neoplasm of endometrium: Secondary | ICD-10-CM | POA: Diagnosis not present

## 2021-04-29 DIAGNOSIS — E039 Hypothyroidism, unspecified: Secondary | ICD-10-CM

## 2021-04-29 DIAGNOSIS — Z95828 Presence of other vascular implants and grafts: Secondary | ICD-10-CM

## 2021-04-29 DIAGNOSIS — I10 Essential (primary) hypertension: Secondary | ICD-10-CM

## 2021-04-29 DIAGNOSIS — Z5112 Encounter for antineoplastic immunotherapy: Secondary | ICD-10-CM | POA: Diagnosis not present

## 2021-04-29 DIAGNOSIS — C770 Secondary and unspecified malignant neoplasm of lymph nodes of head, face and neck: Secondary | ICD-10-CM

## 2021-04-29 DIAGNOSIS — Z7189 Other specified counseling: Secondary | ICD-10-CM

## 2021-04-29 LAB — CBC WITH DIFFERENTIAL/PLATELET
Abs Immature Granulocytes: 0 10*3/uL (ref 0.00–0.07)
Basophils Absolute: 0.1 10*3/uL (ref 0.0–0.1)
Basophils Relative: 2 %
Eosinophils Absolute: 0.2 10*3/uL (ref 0.0–0.5)
Eosinophils Relative: 5 %
HCT: 31.6 % — ABNORMAL LOW (ref 36.0–46.0)
Hemoglobin: 10.2 g/dL — ABNORMAL LOW (ref 12.0–15.0)
Immature Granulocytes: 0 %
Lymphocytes Relative: 20 %
Lymphs Abs: 0.7 10*3/uL (ref 0.7–4.0)
MCH: 31.5 pg (ref 26.0–34.0)
MCHC: 32.3 g/dL (ref 30.0–36.0)
MCV: 97.5 fL (ref 80.0–100.0)
Monocytes Absolute: 0.2 10*3/uL (ref 0.1–1.0)
Monocytes Relative: 6 %
Neutro Abs: 2.2 10*3/uL (ref 1.7–7.7)
Neutrophils Relative %: 67 %
Platelets: 182 10*3/uL (ref 150–400)
RBC: 3.24 MIL/uL — ABNORMAL LOW (ref 3.87–5.11)
RDW: 13.3 % (ref 11.5–15.5)
WBC: 3.4 10*3/uL — ABNORMAL LOW (ref 4.0–10.5)
nRBC: 0 % (ref 0.0–0.2)

## 2021-04-29 LAB — URINALYSIS, COMPLETE (UACMP) WITH MICROSCOPIC
Bacteria, UA: NONE SEEN
Bilirubin Urine: NEGATIVE
Glucose, UA: NEGATIVE mg/dL
Hgb urine dipstick: NEGATIVE
Ketones, ur: NEGATIVE mg/dL
Leukocytes,Ua: NEGATIVE
Nitrite: NEGATIVE
Protein, ur: NEGATIVE mg/dL
Specific Gravity, Urine: 1.015 (ref 1.005–1.030)
pH: 6 (ref 5.0–8.0)

## 2021-04-29 LAB — COMPREHENSIVE METABOLIC PANEL
ALT: 18 U/L (ref 0–44)
AST: 21 U/L (ref 15–41)
Albumin: 3.8 g/dL (ref 3.5–5.0)
Alkaline Phosphatase: 55 U/L (ref 38–126)
Anion gap: 10 (ref 5–15)
BUN: 23 mg/dL (ref 8–23)
CO2: 26 mmol/L (ref 22–32)
Calcium: 9.1 mg/dL (ref 8.9–10.3)
Chloride: 103 mmol/L (ref 98–111)
Creatinine, Ser: 0.85 mg/dL (ref 0.44–1.00)
GFR, Estimated: >60 mL/min (ref >60)
Glucose, Bld: 108 mg/dL — ABNORMAL HIGH (ref 70–99)
Potassium: 4 mmol/L (ref 3.5–5.1)
Sodium: 139 mmol/L (ref 135–145)
Total Bilirubin: 0.4 mg/dL (ref 0.3–1.2)
Total Protein: 7 g/dL (ref 6.5–8.1)

## 2021-04-29 LAB — TSH: TSH: 1.133 u[IU]/mL (ref 0.308–3.960)

## 2021-04-29 MED ORDER — SODIUM CHLORIDE 0.9% FLUSH
10.0000 mL | INTRAVENOUS | Status: DC | PRN
  Administered 2021-04-29: 10 mL

## 2021-04-29 MED ORDER — SODIUM CHLORIDE 0.9% FLUSH
10.0000 mL | Freq: Once | INTRAVENOUS | Status: AC
  Administered 2021-04-29: 10 mL

## 2021-04-29 MED ORDER — SODIUM CHLORIDE 0.9 % IV SOLN
200.0000 mg | Freq: Once | INTRAVENOUS | Status: AC
  Administered 2021-04-29: 200 mg via INTRAVENOUS
  Filled 2021-04-29: qty 8

## 2021-04-29 MED ORDER — SODIUM CHLORIDE 0.9 % IV SOLN
Freq: Once | INTRAVENOUS | Status: AC
Start: 2021-04-29 — End: 2021-04-29

## 2021-04-29 MED ORDER — HEPARIN SOD (PORK) LOCK FLUSH 100 UNIT/ML IV SOLN
500.0000 [IU] | Freq: Once | INTRAVENOUS | Status: AC | PRN
  Administered 2021-04-29: 500 [IU]

## 2021-04-29 NOTE — Telephone Encounter (Signed)
LVM for patient with the following message. Patient provided with callback number if she had any additional questions or concerns.

## 2021-04-29 NOTE — Assessment & Plan Note (Signed)
I have reviewed PET CT imaging with the patient Overall, she have no signs of cancer progression We will continue current treatment with pembrolizumab/Lenvima indefinitely I will space out her next imaging study to 6 months, due in April 2023

## 2021-04-29 NOTE — Progress Notes (Signed)
[facility] OFFICE PROGRESS NOTE  Patient Care Team: [name], MD as PCP - General (Family Medicine) Reynold Bowen, MD as Consulting Physician (Endocrinology)  ASSESSMENT & PLAN:  Endometrial cancer [facility]) I have reviewed PET CT imaging with the patient Overall, she have no signs of cancer progression We will continue current treatment with pembrolizumab/Lenvima indefinitely I will space out her next imaging study to 6 months, due in April 2023  Essential hypertension Her blood pressure was suboptimally controlled Recently, she started to check her blood pressure regularly and majority of her systolic blood pressure is 140 and she is asymptomatic I recommend we repeat urine protein, urinalysis and urine culture to rule out secondary causes At the time of dictation, her urine protein come back completely unremarkable She will continue Lenvima and her current blood pressure treatment  Pancytopenia, acquired ([place]) This is due to side effects of treatment She is not symptomatic Observe only  Orders Placed This Encounter  Procedures   Urine Culture    Standing Status:   Future    Number of Occurrences:   1    Standing Expiration Date:   04/29/2022   Urinalysis, Complete w Microscopic    Standing Status:   Future    Number of Occurrences:   1    Standing Expiration Date:   04/29/2022   Total Protein, Urine dipstick    Standing Status:   Future    Standing Expiration Date:   04/29/2022    All questions were answered. The patient knows to call the clinic with any problems, questions or concerns. The total time spent in the appointment was 25 minutes encounter with patients including review of chart and various tests results, discussions about plan of care and coordination of care plan   [name], MD 04/29/2021 4:15 PM  INTERVAL HISTORY: Please see below for problem oriented charting. she returns for treatment follow-up on pembrolizumab and Lenvima for  uterine cancer She is doing well Her documented blood pressure from home were within normal range with occasional systolic blood pressure around 140 She has no new lymphadenopathy  REVIEW OF SYSTEMS:   Constitutional: Denies fevers, chills or abnormal weight loss Eyes: Denies blurriness of vision Ears, nose, mouth, throat, and face: Denies mucositis or sore throat Respiratory: Denies cough, dyspnea or wheezes Cardiovascular: Denies palpitation, chest discomfort or lower extremity swelling Gastrointestinal:  Denies nausea, heartburn or change in bowel habits Skin: Denies abnormal skin rashes Lymphatics: Denies new lymphadenopathy or easy bruising Neurological:Denies numbness, tingling or new weaknesses Behavioral/Psych: Mood is stable, no new changes  All other systems were reviewed with the patient and are negative.  I have reviewed the past medical history, past surgical history, social history and family history with the patient and they are unchanged from previous note.  ALLERGIES:  has no active allergies.  MEDICATIONS:  Current Outpatient Medications  Medication Sig Dispense Refill   amLODipine (NORVASC) 10 MG tablet TAKE 1 TABLET BY MOUTH ONCE DAILY. **DECREASE SIMVASTATIN TO 20 MG DAILY WHILE ON AMLODIPINE PER MD** 90 tablet 1   busPIRone (BUSPAR) 10 MG tablet TAKE 1 TABLET BY MOUTH TWICE DAILY 180 tablet 0   Calcium Carb-Cholecalciferol 500-600 MG-UNIT TABS Take 1 tablet by mouth daily.      Cholecalciferol (VITAMIN D3) 2000 units TABS Take 2,000 Units by mouth daily.      cyanocobalamin (,VITAMIN B-12,) 1000 MCG/ML injection Inject 1 mL (1,000 mcg total) into the muscle every 30 (thirty) days. 1 mL 11  hydrochlorothiazide (MICROZIDE) 12.5 MG capsule Take 1 capsule (12.5 mg total) by mouth daily. 90 capsule 3   lenvatinib 10 mg daily dose (LENVIMA) capsule TAKE 1 CAPSULE (10 MG TOTAL) BY MOUTH DAILY. 30 each 11   levothyroxine (SYNTHROID) 112 MCG tablet Take 1 tablet by mouth  daily on an empty stomach 30 mins before a meal 90 tablet 3   levothyroxine (SYNTHROID) 75 MCG tablet TAKE 1 TABLET BY MOUTH DAILY BEFORE BREAKFAST. 90 tablet 2   LORazepam (ATIVAN) 0.5 MG tablet Take 0.5 mg by mouth 2 (two) times daily as needed.  0   losartan (COZAAR) 25 MG tablet TAKE 1 TABLET BY MOUTH DAILY 90 tablet 3   metoprolol tartrate (LOPRESSOR) 25 MG tablet TAKE 1 TABLET BY MOUTH TWICE DAILY 180 tablet 3   ondansetron (ZOFRAN) 8 MG tablet Take 1 tablet (8 mg total) by mouth every 8 (eight) hours as needed for nausea. 30 tablet 3   pantoprazole (PROTONIX) 40 MG tablet TAKE 1 TABLET BY MOUTH ONCE DAILY 90 tablet 3   simvastatin (ZOCOR) 20 MG tablet Take 1 tablet (20 mg total) by mouth daily in the evening. 90 tablet 1   tacrolimus (PROTOPIC) 0.1 % ointment Apply 1 application topically daily as needed (rash).      tacrolimus (PROTOPIC) 0.1 % ointment Apply one application topically twice daily. 30 g 1   venlafaxine XR (EFFEXOR XR) 150 MG 24 hr capsule Take 2 capsules (300 mg total) by mouth every morning with food 180 capsule 0   venlafaxine XR (EFFEXOR-XR) 150 MG 24 hr capsule Take 2 capsules by mouth each morning with food. 180 capsule 1   No current facility-administered medications for this visit.   Facility-Administered Medications Ordered in Other Visits  Medication Dose Route Frequency Provider Last Rate Last Admin   sodium chloride flush (NS) 0.9 % injection 10 mL  10 mL Intracatheter PRN Alvy Bimler, Josep Luviano, MD   10 mL at 04/29/21 1304    SUMMARY OF ONCOLOGIC HISTORY: Oncology History Overview Note  [facility] testing done 11-2015 on surgical path from 2014:    MS stable   TMB low  4 muts/mb   ATM [id number]   ERBB3 T389K   E2H2 rearrangement exon 9   PPP2R1A P179R   TP53 I195N    ER- APPROXIMATELY 25-35% STAINING IN NEOPLASTIC CELLS (INTERMEDIATE)  PR- APPROXIMATELY 25-35% STAINING IN NEOPLASTIC CELLS (STRONG)  Repeat biopsy 04/02/17: ER negative, Her 2  negative  Progressed on Doxil   Endometrial cancer ([place])  06/20/2012 Pathology Results   Biopsy positive for papillary serous carcinoma   06/20/2012 Genetic Testing   [facility] One testing done 11-2015 on surgical path from 2014:    MS stable   TMB low  4 muts/mb   ATM [id number]   ERBB3 T389K   E2H2 rearrangement exon 9   PPP2R1A P179R   TP53 I195N    06/20/2012 Initial Diagnosis   Patient presented to PCP with intermittent vaginal bleeding since ~ Oct 2013, endometrial biopsy 06-20-12 with complex endometrial hyperplasia with atypia   06/26/2012 Imaging   Thickened endometrial lining in a postmenopausal patient experiencing vaginal bleeding. In the setting of post-menopausal bleeding, endometrial sampling is indicated to exclude carcinoma. No focal myometrial abnormalities are seen.  Normal left ovary and non-visualized right ovary   07/30/2012 Surgery   Dr. Polly Cobia performed robotic hysterectomy with bilateral salpingo-oophorectomy, bilateral pelvic lymph node dissection and periaortic lymph node dissection. Intraoperatively on frozen section, the patient was noted to have  a large endometrial polyp with changes within the polyp consistent for high-grade malignancy, possibly papillary serous carcinoma. There is no obvious extrauterine disease noted.     07/30/2012 Pathology Results   [phone] SUPPLEMENTAL REPORT  THE ENDOMETRIAL CARCINOMA WAS ANALYZED FOR DNA MISMATCH REPAIR PROTEINS.  IMMUNOHISTOCHEMICALLY, THE NEOPLASM RETAINED NUCLEAR EXPRESSION OF 4 GENE PRODUCTS, MLH1, MSH2, MSH6, AND PMS2, INVOLVED IN DNA MISMATCH REPAIR.  POSITIVE AND NEGATIVE CONTROLS WORKED APPROPRIATELY.  PER REQUEST, AN ER AND PR ARE PERFORMED ON BLOCK 1I.  ER- APPROXIMATELY 25-35% STAINING IN NEOPLASTIC CELLS (INTERMEDIATE)  PR- APPROXIMATELY 25-35% STAINING IN NEOPLASTIC CELLS (STRONG)  ER AND PR PREDOMINANTLY SHOW STAINING IN THE SEROUS COMPONENT.  DIAGNOSIS  1. UTERUS, CERVIX WITH BILATERAL  FALLOPIAN TUBES AND OVARIES,  HYSTERECTOMY AND BILATERAL SALPINGO-OOPHORECTOMY:  HIGH GRADE/POORLY DIFFERENTIATED ENDOMETRIAL ADENOCARCINOMA WITH SOLID AND SEROUS PAPILLARY COMPONENTS.  HISTOPATHOLOGIC TYPE:    A VARIETY OF PATTERNS WERE PRESENT, ALL OF WHICH SHOULD BE CONSIDERED TO BE HIGH GRADE.  SEROUS PAPILLARY CARCINOMA WAS SEEN OCCURRING ADJACENT TO SOLID ENDOMETRIAL CARCINOMA WITH MARKED ANAPLASIA AND GIANT CELLS. SIZE: TUMOR MEASURED AT LEAST 4.8 CM IN GREATEST HORIZONTAL DIMENSION.  GRADE: POORLY DIFFERENTIATED OR HIGH GRADE. DEPTH OF INVASION: NO DEFINITE MYOMETRIAL INVOLVEMENT WAS SEEN.  THE TUMOR APPEARED CONFINED TO THE POLYP AS WELL AS SURFACE ENDOMETRIUM. WHERE MYOMETRIAL THICKNESS NOT APPLICABLE. SEROSAL INVOLVEMENT:       NOT DEMONSTRATED.  ENDOCERVICAL INVOLVEMENT:  NOT DEMONSTRATED. RESECTION MARGINS:         FREE OF INVOLVEMENT. EXTRAUTERINE EXTENSION:    NOT DEMONSTRATED. ANGIOLYMPHATIC INVASION:   NOT DEFINITELY SEEN. TOTAL NODES EXAMINED:      22.    PELVIC NODES EXAMINED:  20.    PELVIC NODES INVOLVED:  0.    PARA-AORTIC NODES       2. EXAMINED:    PARA-AORTIC NODES       0. INVOLVED TNM STAGE:                 T1A N0 MX. AJCC STAGE GROUPING:       IA. FIGO STAGE:                IA.   08/26/2012 Imaging   No CT evidence for intra-abdominal or pelvic metastatic disease. Trace free pelvic fluid, presumably postoperative although the date of surgery is not documented in the electronic medical record.   08/26/2012 - 12/13/2012 Chemotherapy   She received 6 cycles of carbo/taxol    10/29/2012 - 11/27/2012 Radiation Therapy   May 27, June 5,  June 11, June 19, November 27, 2012: Proximal vagina 30 Gy in 5 fractions      01/17/2013 Imaging   1.  No evidence of recurrent or metastatic disease. 2.  No acute abnormality involving the abdomen or pelvis. 3.  Mild diffuse hepatic steatosis. 4.  Very small supraumbilical midline anterior abdominal wall hernia containing fat,  unchanged   01/22/2014 Imaging   No evidence for metastatic or recurrent disease. 2. No bowel obstruction.  Normal appendix. 3. Small fat containing hernia, stable in appearance. 4. Status post hysterectomy and bilateral oophorectomy   02/19/2014 Imaging   No pulmonary lesions are identified. The abnormality on the chest x-ray is due to asymmetric left-sided sternoclavicular joint degenerative disease   10/12/2014 Imaging   New mild retroperitoneal lymphadenopathy in the left paraaortic region and proximal left common iliac chain, consistent with metastatic disease. No other sites of metastatic disease identified within the abdomen or pelvis.  11/27/2014 - 02/11/2015 Chemotherapy   She received 4 cycles of carbo/taxol    03/01/2015 PET scan   Single hypermetabolic small retroperitoneal lymph node along the aorta. 2. No evidence of metastatic disease otherwise in the abdomen or pelvis. No evidence local recurrence. 3. Intensely hypermetabolic enlarged nodule adjacent to the RIGHT lobe of thyroid gland. This presumably represents the biopsied lesion in clinician report which was found to be benign thyroid tissue.   04/05/2015 - 05/13/2015 Radiation Therapy   She received 50.4 gray in 28 fractions with simultaneous integrated boost to 56 gray   06/17/2015 Imaging   No acute process or evidence of metastatic disease in the abdomen or pelvis. Resolution of previously described retroperitoneal adenopathy. 2.  Possible constipation. 3. Atherosclerosis.   06/17/2015 Tumor Marker   Patient's tumor was tested for the following markers: CA125 Results of the tumor marker test revealed 33   08/12/2015 Tumor Marker   Patient's tumor was tested for the following markers: CA125 Results of the tumor marker test revealed 52   08/31/2015 PET scan   Development of right paratracheal hypermetabolic adenopathy, consistent with nodal metastasis. 2. The previously described isolated abdominal retroperitoneal  hypermetabolic node has resolved. 3. Persistent hypermetabolic right thyroid nodule, per report previously biopsied. Correlate with those results. 4.  Possible constipation.   09/09/2015 -  Anti-estrogen oral therapy   She has been receiving alternative treatment between megace and tamoxifen   09/09/2015 Tumor Marker   Patient's tumor was tested for the following markers: CA125 Results of the tumor marker test revealed 37.8   09/20/2015 Procedure   Technically successful ultrasound-guided thyroid aspiration biopsy , dominant right nodule   09/20/2015 Pathology Results   THYROID, RIGHT, FINE NEEDLE ASPIRATION (SPECIMEN 1 OF 1 COLLECTED 09/20/15): FINDINGS CONSISTENT WITH BENIGN THYROID NODULE (BETHESDA CATEGORY II).   10/28/2015 Tumor Marker   Patient's tumor was tested for the following markers: CA125 Results of the tumor marker test revealed 53.2   11/04/2015 Imaging   Stable benign right thyroid nodule   12/16/2015 Tumor Marker   Patient's tumor was tested for the following markers: CA125 Results of the tumor marker test revealed 38.1   03/22/2016 PET scan   Interval progression of hypermetabolic right paratracheal lymph node consistent with metastatic involvement. 2. Stable hypermetabolic right thyroid nodule. Reportedly this has been biopsied in the past.   03/23/2016 Tumor Marker   Patient's tumor was tested for the following markers: CA125 Results of the tumor marker test revealed 62.4   04/13/2016 - 06/01/2016 Radiation Therapy   She received 56 Gy to the chest in 28 fractions    05/09/2016 Imaging   No evidence of left lower extremity deep vein thrombosis. No evidence of a superficial thrombosis of the greater and lesser saphenous veins. Positive for thrombus noted in several varicosities of the calf. No evidence of Baker's cyst on the left.   05/17/2016 Tumor Marker   Patient's tumor was tested for the following markers: CA125 Results of the tumor marker test revealed 9    06/29/2016 Tumor Marker   Patient's tumor was tested for the following markers: CA125 Results of the tumor marker test revealed 5.9   08/04/2016 Tumor Marker   Patient's tumor was tested for the following markers: CA125 Results of the tumor marker test revealed 6.0   09/12/2016 PET scan   Complete metabolic response to therapy, with resolution of hypermetabolic mediastinal lymphadenopathy since prior exam. No residual or new metastatic disease identified. Stable hypermetabolic right thyroid lobe nodule,  which was previously biopsied on 09/20/2015.   03/13/2017 PET scan   1. Solitary focus of recurrent right paratracheal hypermetabolic activity, with a 1.0 cm right lower paratracheal node having a maximum SUV of 9.0. Appearance compatible with recurrent malignancy. 2. Continued hypermetabolic right thyroid nodule, previously biopsied, presumed benign -correlate with prior biopsy results. 3. Other imaging findings of potential clinical significance: Aortic Atherosclerosis (ICD10-I70.0). Mild cardiomegaly. Prominent stool throughout the colon favors constipation.   04/02/2017 Pathology Results   FINE NEEDLE ASPIRATION, ENDOSCOPIC, (EBUS) 4 R NODE (SPECIMEN 1 OF 2 COLLECTED 04/02/17): MALIGNANT CELLS PRESENT, CONSISTENT WITH CARCINOMA. SEE COMMENT. COMMENT: THE MALIGNANT CELLS ARE POSITIVE FOR P53 AND NEGATIVE FOR ESTROGEN RECEPTOR AND TTF-1. THIS PROFILE IS NON-SPECIFIC, BUT THE P53 POSITIVE STAINING IS SUGGESTIVE OF GYNECOLOGIC PRIMARY.    04/11/2017 Procedure   Successful 8 French right internal jugular vein power port placement with its tip at the SVC/RA junction   04/12/2017 Imaging   Normal LV size with mild LV hypertrophy. EF 55-60%. Normal RV size and systolic function. Aortic valve sclerosis without significant stenosis.   04/18/2017 - 06/14/2017 Chemotherapy   The patient had chemotherapy with Doxil    07/10/2017 Imaging   - Left ventricle: The cavity size was normal. There was mild  concentric hypertrophy. Systolic function was normal. The estimated ejection fraction was in the range of 55% to 60%. Wall motion was normal; there were no regional wall motion abnormalities. Doppler parameters are consistent with abnormal left ventricular relaxation (grade 1 diastolic dysfunction). - Aortic valve: Noncoronary cusp mobility was mildly restricted. - Mitral valve: There was mild regurgitation. - Left atrium: The atrium was mildly dilated. - Atrial septum: No defect or patent foramen ovale was identified.  Impressions:  - Compared to November 2018, global LV longitudinal strain remains normal (has increased)   07/11/2017 PET scan   Increased size and hypermetabolic activity of 3.3 cm right thyroid lobe nodule. Thyroid carcinoma cannot be excluded. Recommend repeat ultrasound guided fine needle aspiration to exclude thyroid carcinoma.  New adjacent hypermetabolic 10 mm right supraclavicular lymph node, suspicious for lymph node metastasis.  Slight increase in size and hypermetabolic activity of solitary right paratracheal lymph node.  No evidence of abdominal or pelvic metastatic disease.   07/18/2017 Procedure   1. Technically successful ultrasound guided fine needle aspiration of indeterminate hypermetabolic right-sided thyroid nodule/mass. 2. Technically successful ultrasound-guided core needle biopsy of hypermetabolic right lower cervical lymph node.   07/18/2017 Pathology Results   THYROID, FINE NEEDLE ASPIRATION RIGHT (SPECIMEN 1 OF 1, COLLECTED ON 07/18/2017): ATYPIA OF UNDETERMINED SIGNIFICANCE OR FOLLICULAR LESION OF UNDETERMINED SIGNIFICANCE (BETHESDA CATEGORY III). SEE COMMENT. COMMENT: THE SPECIMEN CONSISTS OF SMALL AND MEDIUM SIZED GROUPS OF FOLLICULAR EPITHELIAL CELLS WITH MILD CYTOLOGIC ATYPIA INCLUDING NUCLEAR ENLARGEMENT AND HURTHLE CELL CHANGE. SOME GROUPS ARE ARRANGED AS MICROFOLLICLES. THERE IS MINIMAL BACKGROUND COLLOID. BASED ON THESE FEATURES, A FOLLICULAR  LESION/NEOPLASM CAN NOT BE ENTIRELY RULED OUT. A SPECIMEN WILL BE SENT FOR AFIRMA TESTING.   07/19/2017 Pathology Results   Lymph node, needle/core biopsy - METASTATIC PAPILLARY SEROUS CARCINOMA. - SEE COMMENT. Microscopic Comment Dr. Vicente Males has reviewed the case and concurs with this interpretation   10/15/2017 PET scan   No new or progressive disease. No evidence of abdominal or pelvic metastatic disease.  Stable hypermetabolic right thyroid lobe nodule and adjacent right supraclavicular lymph node.  Decreased size and hypermetabolic activity of solitary right paratracheal lymph node.   01/23/2018 PET scan   1. Overall, no significant change  from PET-CT of 3 months ago. There is persistent hypermetabolic activity at the right thoracic inlet and within a right paratracheal mediastinal node. The nodes have not significantly changed in size, although the metabolic activity has minimally increased over this interval. It is uncertain how much of the thoracic inlet metabolic activity is attributed to the thyroid nodule versus adjacent cervical lymph nodes, both previously biopsied. 2. No new hypermetabolic activity within the neck, chest, abdomen or pelvis. No evidence of local recurrence in the pelvis. 3. Stable probable radiation changes in the right lung.   04/16/2018 PET scan   1. Interval increase in metabolic activity of the RIGHT supraclavicular node and RIGHT lower paratracheal mediastinal node with minimal change in size. Findings consistent with persistent and mildly progressed metastatic adenopathy. 2. New hypermetabolic small lymph node in the RIGHT lower paratracheal nodal station adjacent to the previously followed node. 3. No evidence of local recurrence in the pelvis or new disease elsewhere.   07/30/2018 PET scan   1. Mild interval progression of right supraclavicular, mediastinal, and right hilar hypermetabolic metastatic disease. There is a new hypermetabolic lymph node in  the subcarinal station on today's study. 2. No hypermetabolic disease in the abdomen or pelvis to suggest recurrent/metastases. 3.  Aortic Atherosclerois (ICD10-170.0)     08/16/2018 -  Chemotherapy   The patient had Lenvima since 3/6 and pembrolizumab since 3/13 for chemotherapy treatment. Michel Santee was held temporarily due to infection and severe hypertension   10/31/2018 PET scan   Partial response to therapy, with decreased hypermetabolic lymphadenopathy in mediastinum and right hilar region.   No significant change in hypermetabolic lymphadenopathy in right inferior neck.   No new or progressive metastatic disease identified. No evidence of recurrent or metastatic carcinoma in the pelvis or abdomen   02/04/2019 PET scan   1. Decrease in metabolic activity of RIGHT supraclavicular node and RIGHT paratracheal lymph nodes. 2. Persistent activity in the RIGHT thyroid nodule unchanged. 3. Diffuse activity through the esophagus is favored esophagitis. No change. 4. Scattered foci of intense metabolic activity associated the bowel without focal lesion on CT favored benign. 5. No evidence of peritoneal metastasis. 6. No evidence of metastatic adenopathy in the abdomen pelvis. 7. No evidence of local pelvic sidewall recurrence.   07/30/2019 PET scan   1. Mildly increased uptake within small nodes along the right sternocleidomastoid without change in size and associated with increased deltoid activity may reflect changes related to right arm injection or recent COVID vaccination, consider clinical correlation and attention on follow-up. 2. Persistent activity in the right thyroid nodule, unchanged from previous exams. 3. New activity in a juxta esophageal lymph node in the chest is suspicious though the node is unchanged with respect to size. Endoscopic correlation could potentially be helpful or close attention on follow-up. 4. Subtle increased FDG uptake along the left mainstem bronchus and  adjacent to the aorta may represent a small lymph node. No discrete correlate is demonstrated on today's study.   10/22/2019 PET scan   1. Persistent FDG avid subcentimeter right supraclavicular and juxta esophageal lymph nodes. The degree of FDG uptake is similar to the previous exam. No new or progressive findings identified. 2. No findings of solid organ metastasis, skeletal metastasis or metastatic disease to the abdomen or pelvis. 3. FDG avid right lobe of thyroid gland nodule is again noted This nodule is not incidental and been worked up previously with 2 biopsies. Please refer to results from the most recent biopsy dated 07/18/2017. (Ref:  J Am Coll Radiol. 2015 Feb;12(2): 143-50). 4.  Aortic Atherosclerosis (ICD10-I70.0).   05/05/2020 PET scan   1. Progressive disease, as evidenced by increased hypermetabolism within right supraclavicular and mediastinal nodes. 2. No infradiaphragmatic hypermetabolic metastasis. 3.  Aortic Atherosclerosis (ICD10-I70.0). 4. Hypermetabolic right thyroid nodule has been detailed on prior exams and was biopsied on 07/18/2017   09/10/2020 PET scan   1. Interval improvement in previously demonstrated hypermetabolic mediastinal and right supraclavicular adenopathy. A single hypermetabolic left paraesophageal lymph node remains, improved from previous study. The other nodes have resolved.  2. No progressive disease. 3. Diffuse esophageal hypermetabolic activity suggesting esophagitis. 4. Hypermetabolic right thyroid nodule has been previously biopsied.   03/18/2021 PET scan   1. No significant change in single residual FDG avid left paraesophageal lymph node. This has an SUV max of 7.03, compared with 6.4 previously. No new sites of disease identified. 2. Diffuse esophageal hypermetabolic activity suggestive of esophagitis. 3. FDG avid right lobe of thyroid gland nodule. This has been biopsied previously. (Ref: J Am Coll Radiol. 2015 Feb;12(2): 143-50).      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 Lenvima was held temporarily due to infection and severe hypertension     PHYSICAL EXAMINATION: ECOG PERFORMANCE STATUS: 0 - Asymptomatic  Vitals:   04/29/21 1113  BP: (!) 141/73  Pulse: 71  Resp: 16  Temp: 97.7 F (36.5 C)  SpO2: 99%   Filed Weights   04/29/21 1113  Weight: 150 lb 9.6 oz (68.3 kg)    GENERAL:alert, no distress and comfortable SKIN: skin color, texture, turgor are normal, no rashes or significant lesions EYES: normal, Conjunctiva are pink and non-injected, sclera clear OROPHARYNX:no exudate, no erythema and lips, buccal mucosa, and tongue normal  NECK: supple, thyroid normal size, non-tender, without nodularity LYMPH:  no palpable lymphadenopathy in the cervical, axillary or inguinal LUNGS: clear to auscultation and percussion with normal breathing effort HEART: regular rate & rhythm and no murmurs and no lower extremity edema ABDOMEN:abdomen soft, non-tender and normal bowel sounds Musculoskeletal:no cyanosis of digits and no clubbing  NEURO: alert & oriented x 3 with fluent speech, no focal motor/sensory deficits  LABORATORY DATA:  I have reviewed the data as listed    Component Value Date/Time   NA 139 04/29/2021 1057   NA 141 05/17/2017 0957   K 4.0 04/29/2021 1057   K 3.6 05/17/2017 0957   CL 103 04/29/2021 1057   CL 105 11/19/2012 0848   CO2 26 04/29/2021 1057   CO2 25 05/17/2017 0957   GLUCOSE 108 (H) 04/29/2021 1057   GLUCOSE 85 05/17/2017 0957   GLUCOSE 122 (H) 11/19/2012 0848   BUN 23 04/29/2021 1057   BUN 15.2 05/17/2017 0957   CREATININE 0.85 04/29/2021 1057   CREATININE 0.84 03/18/2021 1003   CREATININE 0.8 05/17/2017 0957   CALCIUM 9.1 04/29/2021 1057   CALCIUM 9.6 05/17/2017 0957    PROT 7.0 04/29/2021 1057   PROT 7.0 05/17/2017 0957   ALBUMIN 3.8 04/29/2021 1057   ALBUMIN 3.9 05/17/2017 0957   AST 21 04/29/2021 1057   AST 24 03/18/2021 1003   AST 25 05/17/2017 0957   ALT 18 04/29/2021 1057   ALT 21 03/18/2021 1003   ALT 18 05/17/2017 0957   ALKPHOS 55 04/29/2021 1057   ALKPHOS 57 05/17/2017  0957   BILITOT 0.4 04/29/2021 1057   BILITOT 0.6 03/18/2021 1003   BILITOT 0.47 05/17/2017 0957   GFRNONAA >60 04/29/2021 1057   GFRNONAA >60 03/18/2021 1003   GFRAA >60 02/26/2020 0940   GFRAA >60 07/10/2019 1240    No results found for: SPEP, UPEP  Lab Results  Component Value Date   WBC 3.4 (L) 04/29/2021   NEUTROABS 2.2 04/29/2021   HGB 10.2 (L) 04/29/2021   HCT 31.6 (L) 04/29/2021   MCV 97.5 04/29/2021   PLT 182 04/29/2021      Chemistry      Component Value Date/Time   NA 139 04/29/2021 1057   NA 141 05/17/2017 0957   K 4.0 04/29/2021 1057   K 3.6 05/17/2017 0957   CL 103 04/29/2021 1057   CL 105 11/19/2012 0848   CO2 26 04/29/2021 1057   CO2 25 05/17/2017 0957   BUN 23 04/29/2021 1057   BUN 15.2 05/17/2017 0957   CREATININE 0.85 04/29/2021 1057   CREATININE 0.84 03/18/2021 1003   CREATININE 0.8 05/17/2017 0957   GLU 158 (H) 01/14/2015 1035      Component Value Date/Time   CALCIUM 9.1 04/29/2021 1057   CALCIUM 9.6 05/17/2017 0957   ALKPHOS 55 04/29/2021 1057   ALKPHOS 57 05/17/2017 0957   AST 21 04/29/2021 1057   AST 24 03/18/2021 1003   AST 25 05/17/2017 0957   ALT 18 04/29/2021 1057   ALT 21 03/18/2021 1003   ALT 18 05/17/2017 0957   BILITOT 0.4 04/29/2021 1057   BILITOT 0.6 03/18/2021 1003   BILITOT 0.47 05/17/2017 0957

## 2021-04-29 NOTE — Patient Instructions (Signed)
[facility]  Discharge Instructions: ?Thank you for choosing [facility] to provide your oncology and hematology care.  ? ?If you have a lab appointment with the Cancer Center, please go directly to the Cancer Center and check in at the registration area. ?  ?Wear comfortable clothing and clothing appropriate for easy access to any Portacath or PICC line.  ? ?We strive to give you quality time with your provider. You may need to reschedule your appointment if you arrive late (15 or more minutes).  Arriving late affects you and other patients whose appointments are after yours.  Also, if you miss three or more appointments without notifying the office, you may be dismissed from the clinic at the provider?s discretion.    ?  ?For prescription refill requests, have your pharmacy contact our office and allow 72 hours for refills to be completed.   ? ?Today you received the following chemotherapy and/or immunotherapy agents: Keytruda ?  ?To help prevent nausea and vomiting after your treatment, we encourage you to take your nausea medication as directed. ? ?BELOW ARE SYMPTOMS THAT SHOULD BE REPORTED IMMEDIATELY: ?*FEVER GREATER THAN 100.4 F (38 ?C) OR HIGHER ?*CHILLS OR SWEATING ?*NAUSEA AND VOMITING THAT IS NOT CONTROLLED WITH YOUR NAUSEA MEDICATION ?*UNUSUAL SHORTNESS OF BREATH ?*UNUSUAL BRUISING OR BLEEDING ?*URINARY PROBLEMS (pain or burning when urinating, or frequent urination) ?*BOWEL PROBLEMS (unusual diarrhea, constipation, pain near the anus) ?TENDERNESS IN MOUTH AND THROAT WITH OR WITHOUT PRESENCE OF ULCERS (sore throat, sores in mouth, or a toothache) ?UNUSUAL RASH, SWELLING OR PAIN  ?UNUSUAL VAGINAL DISCHARGE OR ITCHING  ? ?Items with * indicate a potential emergency and should be followed up as soon as possible or go to the Emergency Department if any problems should occur. ? ?Please show the CHEMOTHERAPY ALERT CARD or IMMUNOTHERAPY ALERT CARD at check-in to the  Emergency Department and triage nurse. ? ?Should you have questions after your visit or need to cancel or reschedule your appointment, please contact [facility]  Dept: [phone]  and follow the prompts.  Office hours are 8:00 a.m. to 4:30 p.m. Monday - Friday. Please note that voicemails left after 4:00 p.m. may not be returned until the following business day.  We are closed weekends and major holidays. You have access to a nurse at all times for urgent questions. Please call the main number to the clinic Dept: [phone] and follow the prompts. ? ? ?For any non-urgent questions, you may also contact your provider using MyChart. We now offer e-Visits for anyone 18 and older to request care online for non-urgent symptoms. For details visit [email or web address]. ?  ?Also download the MyChart app! Go to the app store, search "MyChart", open the app, select [facility], and log in with your MyChart username and password. ? ?Due to Covid, a mask is required upon entering the hospital/clinic. If you do not have a mask, one will be given to you upon arrival. For doctor visits, patients may have 1 support person aged 18 or older with them. For treatment visits, patients cannot have anyone with them due to current Covid guidelines and our immunocompromised population.  ? ?

## 2021-04-29 NOTE — Telephone Encounter (Signed)
-----   Message from [name], MD sent at 04/29/2021  3:38 PM EST ----- Regarding: Urine Her UA and urine protein are both normal We can keep her BP meds and Lenvima now, no problem

## 2021-04-29 NOTE — Assessment & Plan Note (Signed)
Her blood pressure was suboptimally controlled Recently, she started to check her blood pressure regularly and majority of her systolic blood pressure is 140 and she is asymptomatic I recommend we repeat urine protein, urinalysis and urine culture to rule out secondary causes At the time of dictation, her urine protein come back completely unremarkable She will continue Lenvima and her current blood pressure treatment

## 2021-04-29 NOTE — Assessment & Plan Note (Signed)
This is due to side effects of treatment She is not symptomatic Observe only 

## 2021-04-30 LAB — URINE CULTURE: Culture: NO GROWTH

## 2021-05-04 ENCOUNTER — Other Ambulatory Visit (HOSPITAL_COMMUNITY): Payer: Self-pay

## 2021-05-04 MED ORDER — VENLAFAXINE HCL ER 150 MG PO CP24
ORAL_CAPSULE | ORAL | 0 refills | Status: DC
Start: 2021-05-04 — End: 2021-09-01
  Filled 2021-05-04: qty 180, 90d supply, fill #0

## 2021-05-18 ENCOUNTER — Other Ambulatory Visit (HOSPITAL_COMMUNITY): Payer: Self-pay

## 2021-05-19 ENCOUNTER — Other Ambulatory Visit: Payer: Self-pay | Admitting: Hematology and Oncology

## 2021-05-19 ENCOUNTER — Other Ambulatory Visit (HOSPITAL_COMMUNITY): Payer: Self-pay

## 2021-05-19 MED ORDER — LENVIMA (10 MG DAILY DOSE) 10 MG PO CPPK
ORAL_CAPSULE | Freq: Every day | ORAL | 11 refills | Status: DC
  Filled 2021-06-01: qty 30, 30d supply, fill #0
  Filled 2021-06-27: qty 30, 30d supply, fill #1
  Filled 2021-07-26: qty 30, 30d supply, fill #2
  Filled 2021-08-25: qty 30, 30d supply, fill #3
  Filled 2021-09-23: qty 30, 30d supply, fill #4
  Filled 2021-10-28: qty 30, 30d supply, fill #5
  Filled 2021-11-21: qty 30, 30d supply, fill #6
  Filled 2022-02-08: qty 30, 30d supply, fill #7
  Filled 2022-03-07: qty 30, 30d supply, fill #8
  Filled 2022-04-04: qty 30, 30d supply, fill #9
  Filled 2022-05-08 – 2022-05-10 (×2): qty 30, 30d supply, fill #10

## 2021-05-20 ENCOUNTER — Inpatient Hospital Stay: Payer: 59 | Attending: Hematology and Oncology

## 2021-05-20 ENCOUNTER — Inpatient Hospital Stay: Payer: 59

## 2021-05-20 ENCOUNTER — Other Ambulatory Visit: Payer: Self-pay

## 2021-05-20 VITALS — BP 132/72 | HR 72 | Temp 98.2°F | Resp 18

## 2021-05-20 DIAGNOSIS — C541 Malignant neoplasm of endometrium: Secondary | ICD-10-CM

## 2021-05-20 DIAGNOSIS — Z5112 Encounter for antineoplastic immunotherapy: Secondary | ICD-10-CM | POA: Diagnosis not present

## 2021-05-20 DIAGNOSIS — Z95828 Presence of other vascular implants and grafts: Secondary | ICD-10-CM

## 2021-05-20 DIAGNOSIS — Z7189 Other specified counseling: Secondary | ICD-10-CM

## 2021-05-20 DIAGNOSIS — C770 Secondary and unspecified malignant neoplasm of lymph nodes of head, face and neck: Secondary | ICD-10-CM

## 2021-05-20 LAB — COMPREHENSIVE METABOLIC PANEL
ALT: 15 U/L (ref 0–44)
AST: 22 U/L (ref 15–41)
Albumin: 3.8 g/dL (ref 3.5–5.0)
Alkaline Phosphatase: 49 U/L (ref 38–126)
Anion gap: 10 (ref 5–15)
BUN: 16 mg/dL (ref 8–23)
CO2: 26 mmol/L (ref 22–32)
Calcium: 9.5 mg/dL (ref 8.9–10.3)
Chloride: 102 mmol/L (ref 98–111)
Creatinine, Ser: 0.78 mg/dL (ref 0.44–1.00)
GFR, Estimated: >60 mL/min (ref >60)
Glucose, Bld: 92 mg/dL (ref 70–99)
Potassium: 4.1 mmol/L (ref 3.5–5.1)
Sodium: 138 mmol/L (ref 135–145)
Total Bilirubin: 0.4 mg/dL (ref 0.3–1.2)
Total Protein: 7.1 g/dL (ref 6.5–8.1)

## 2021-05-20 LAB — CBC WITH DIFFERENTIAL/PLATELET
Abs Immature Granulocytes: 0 10*3/uL (ref 0.00–0.07)
Basophils Absolute: 0 10*3/uL (ref 0.0–0.1)
Basophils Relative: 1 %
Eosinophils Absolute: 0.2 10*3/uL (ref 0.0–0.5)
Eosinophils Relative: 6 %
HCT: 31.3 % — ABNORMAL LOW (ref 36.0–46.0)
Hemoglobin: 10.1 g/dL — ABNORMAL LOW (ref 12.0–15.0)
Immature Granulocytes: 0 %
Lymphocytes Relative: 21 %
Lymphs Abs: 0.7 10*3/uL (ref 0.7–4.0)
MCH: 31.1 pg (ref 26.0–34.0)
MCHC: 32.3 g/dL (ref 30.0–36.0)
MCV: 96.3 fL (ref 80.0–100.0)
Monocytes Absolute: 0.3 10*3/uL (ref 0.1–1.0)
Monocytes Relative: 9 %
Neutro Abs: 2.2 10*3/uL (ref 1.7–7.7)
Neutrophils Relative %: 63 %
Platelets: 165 10*3/uL (ref 150–400)
RBC: 3.25 MIL/uL — ABNORMAL LOW (ref 3.87–5.11)
RDW: 13.3 % (ref 11.5–15.5)
WBC: 3.5 10*3/uL — ABNORMAL LOW (ref 4.0–10.5)
nRBC: 0 % (ref 0.0–0.2)

## 2021-05-20 LAB — TOTAL PROTEIN, URINE DIPSTICK: Protein, ur: NEGATIVE mg/dL

## 2021-05-20 MED ORDER — SODIUM CHLORIDE 0.9 % IV SOLN
200.0000 mg | Freq: Once | INTRAVENOUS | Status: AC
  Administered 2021-05-20: 200 mg via INTRAVENOUS
  Filled 2021-05-20: qty 8

## 2021-05-20 MED ORDER — SODIUM CHLORIDE 0.9% FLUSH
10.0000 mL | Freq: Once | INTRAVENOUS | Status: AC
  Administered 2021-05-20: 10 mL

## 2021-05-20 MED ORDER — SODIUM CHLORIDE 0.9 % IV SOLN: Freq: Once | INTRAVENOUS | Status: AC

## 2021-05-20 NOTE — Patient Instructions (Signed)
[facility]  Discharge Instructions: ?Thank you for choosing [facility] to provide your oncology and hematology care.  ? ?If you have a lab appointment with the Cancer Center, please go directly to the Cancer Center and check in at the registration area. ?  ?Wear comfortable clothing and clothing appropriate for easy access to any Portacath or PICC line.  ? ?We strive to give you quality time with your provider. You may need to reschedule your appointment if you arrive late (15 or more minutes).  Arriving late affects you and other patients whose appointments are after yours.  Also, if you miss three or more appointments without notifying the office, you may be dismissed from the clinic at the provider?s discretion.    ?  ?For prescription refill requests, have your pharmacy contact our office and allow 72 hours for refills to be completed.   ? ?Today you received the following chemotherapy and/or immunotherapy agents: Keytruda ?  ?To help prevent nausea and vomiting after your treatment, we encourage you to take your nausea medication as directed. ? ?BELOW ARE SYMPTOMS THAT SHOULD BE REPORTED IMMEDIATELY: ?*FEVER GREATER THAN 100.4 F (38 ?C) OR HIGHER ?*CHILLS OR SWEATING ?*NAUSEA AND VOMITING THAT IS NOT CONTROLLED WITH YOUR NAUSEA MEDICATION ?*UNUSUAL SHORTNESS OF BREATH ?*UNUSUAL BRUISING OR BLEEDING ?*URINARY PROBLEMS (pain or burning when urinating, or frequent urination) ?*BOWEL PROBLEMS (unusual diarrhea, constipation, pain near the anus) ?TENDERNESS IN MOUTH AND THROAT WITH OR WITHOUT PRESENCE OF ULCERS (sore throat, sores in mouth, or a toothache) ?UNUSUAL RASH, SWELLING OR PAIN  ?UNUSUAL VAGINAL DISCHARGE OR ITCHING  ? ?Items with * indicate a potential emergency and should be followed up as soon as possible or go to the Emergency Department if any problems should occur. ? ?Please show the CHEMOTHERAPY ALERT CARD or IMMUNOTHERAPY ALERT CARD at check-in to the  Emergency Department and triage nurse. ? ?Should you have questions after your visit or need to cancel or reschedule your appointment, please contact [facility]  Dept: [phone]  and follow the prompts.  Office hours are 8:00 a.m. to 4:30 p.m. Monday - Friday. Please note that voicemails left after 4:00 p.m. may not be returned until the following business day.  We are closed weekends and major holidays. You have access to a nurse at all times for urgent questions. Please call the main number to the clinic Dept: [phone] and follow the prompts. ? ? ?For any non-urgent questions, you may also contact your provider using MyChart. We now offer e-Visits for anyone 18 and older to request care online for non-urgent symptoms. For details visit [email or web address]. ?  ?Also download the MyChart app! Go to the app store, search "MyChart", open the app, select [facility], and log in with your MyChart username and password. ? ?Due to Covid, a mask is required upon entering the hospital/clinic. If you do not have a mask, one will be given to you upon arrival. For doctor visits, patients may have 1 support person aged 18 or older with them. For treatment visits, patients cannot have anyone with them due to current Covid guidelines and our immunocompromised population.  ? ?

## 2021-05-22 ENCOUNTER — Other Ambulatory Visit: Payer: Self-pay

## 2021-06-01 ENCOUNTER — Other Ambulatory Visit (HOSPITAL_COMMUNITY): Payer: Self-pay

## 2021-06-08 ENCOUNTER — Other Ambulatory Visit (HOSPITAL_COMMUNITY): Payer: Self-pay

## 2021-06-09 ENCOUNTER — Other Ambulatory Visit (HOSPITAL_COMMUNITY): Payer: Self-pay

## 2021-06-09 MED ORDER — BUSPIRONE HCL 10 MG PO TABS
ORAL_TABLET | ORAL | 0 refills | Status: DC
  Filled 2021-06-09: qty 180, 90d supply, fill #0

## 2021-06-10 ENCOUNTER — Inpatient Hospital Stay: Payer: 59 | Attending: Hematology and Oncology

## 2021-06-10 ENCOUNTER — Inpatient Hospital Stay (HOSPITAL_BASED_OUTPATIENT_CLINIC_OR_DEPARTMENT_OTHER): Payer: 59 | Admitting: Hematology and Oncology

## 2021-06-10 ENCOUNTER — Encounter: Payer: Self-pay | Admitting: Hematology and Oncology

## 2021-06-10 ENCOUNTER — Inpatient Hospital Stay: Payer: 59

## 2021-06-10 ENCOUNTER — Other Ambulatory Visit: Payer: Self-pay

## 2021-06-10 ENCOUNTER — Other Ambulatory Visit (HOSPITAL_COMMUNITY): Payer: Self-pay

## 2021-06-10 ENCOUNTER — Telehealth: Payer: Self-pay | Admitting: Hematology and Oncology

## 2021-06-10 VITALS — BP 141/85 | HR 65 | Temp 97.7°F | Resp 18 | Ht 64.0 in | Wt 148.8 lb

## 2021-06-10 DIAGNOSIS — C541 Malignant neoplasm of endometrium: Secondary | ICD-10-CM | POA: Diagnosis not present

## 2021-06-10 DIAGNOSIS — Z5112 Encounter for antineoplastic immunotherapy: Secondary | ICD-10-CM | POA: Insufficient documentation

## 2021-06-10 DIAGNOSIS — Z95828 Presence of other vascular implants and grafts: Secondary | ICD-10-CM

## 2021-06-10 DIAGNOSIS — Z79899 Other long term (current) drug therapy: Secondary | ICD-10-CM | POA: Insufficient documentation

## 2021-06-10 DIAGNOSIS — Z7189 Other specified counseling: Secondary | ICD-10-CM

## 2021-06-10 DIAGNOSIS — I10 Essential (primary) hypertension: Secondary | ICD-10-CM

## 2021-06-10 DIAGNOSIS — C770 Secondary and unspecified malignant neoplasm of lymph nodes of head, face and neck: Secondary | ICD-10-CM

## 2021-06-10 DIAGNOSIS — D61818 Other pancytopenia: Secondary | ICD-10-CM | POA: Diagnosis not present

## 2021-06-10 DIAGNOSIS — E039 Hypothyroidism, unspecified: Secondary | ICD-10-CM | POA: Diagnosis not present

## 2021-06-10 LAB — CBC WITH DIFFERENTIAL/PLATELET
Abs Immature Granulocytes: 0 10*3/uL (ref 0.00–0.07)
Basophils Absolute: 0 10*3/uL (ref 0.0–0.1)
Basophils Relative: 1 %
Eosinophils Absolute: 0.2 10*3/uL (ref 0.0–0.5)
Eosinophils Relative: 7 %
HCT: 32.4 % — ABNORMAL LOW (ref 36.0–46.0)
Hemoglobin: 10.5 g/dL — ABNORMAL LOW (ref 12.0–15.0)
Immature Granulocytes: 0 %
Lymphocytes Relative: 20 %
Lymphs Abs: 0.7 10*3/uL (ref 0.7–4.0)
MCH: 31.1 pg (ref 26.0–34.0)
MCHC: 32.4 g/dL (ref 30.0–36.0)
MCV: 95.9 fL (ref 80.0–100.0)
Monocytes Absolute: 0.3 10*3/uL (ref 0.1–1.0)
Monocytes Relative: 8 %
Neutro Abs: 2.4 10*3/uL (ref 1.7–7.7)
Neutrophils Relative %: 64 %
Platelets: 179 10*3/uL (ref 150–400)
RBC: 3.38 MIL/uL — ABNORMAL LOW (ref 3.87–5.11)
RDW: 13.1 % (ref 11.5–15.5)
WBC: 3.6 10*3/uL — ABNORMAL LOW (ref 4.0–10.5)
nRBC: 0 % (ref 0.0–0.2)

## 2021-06-10 LAB — COMPREHENSIVE METABOLIC PANEL
ALT: 17 U/L (ref 0–44)
AST: 21 U/L (ref 15–41)
Albumin: 4 g/dL (ref 3.5–5.0)
Alkaline Phosphatase: 47 U/L (ref 38–126)
Anion gap: 6 (ref 5–15)
BUN: 22 mg/dL (ref 8–23)
CO2: 29 mmol/L (ref 22–32)
Calcium: 9.7 mg/dL (ref 8.9–10.3)
Chloride: 101 mmol/L (ref 98–111)
Creatinine, Ser: 0.82 mg/dL (ref 0.44–1.00)
GFR, Estimated: >60 mL/min (ref >60)
Glucose, Bld: 83 mg/dL (ref 70–99)
Potassium: 4 mmol/L (ref 3.5–5.1)
Sodium: 136 mmol/L (ref 135–145)
Total Bilirubin: 0.6 mg/dL (ref 0.3–1.2)
Total Protein: 7.2 g/dL (ref 6.5–8.1)

## 2021-06-10 MED ORDER — SODIUM CHLORIDE 0.9 % IV SOLN: Freq: Once | INTRAVENOUS | Status: DC

## 2021-06-10 MED ORDER — SODIUM CHLORIDE 0.9% FLUSH
10.0000 mL | Freq: Once | INTRAVENOUS | Status: AC
  Administered 2021-06-10: 10 mL

## 2021-06-10 MED ORDER — HYDROCHLOROTHIAZIDE 12.5 MG PO CAPS
12.5000 mg | ORAL_CAPSULE | Freq: Every day | ORAL | 3 refills | Status: DC
  Filled 2021-06-10 – 2021-09-06 (×2): qty 90, 90d supply, fill #0
  Filled 2021-12-02: qty 90, 90d supply, fill #1
  Filled 2022-03-07: qty 90, 90d supply, fill #2
  Filled 2022-06-02: qty 90, 90d supply, fill #3

## 2021-06-10 MED ORDER — SODIUM CHLORIDE 0.9 % IV SOLN
200.0000 mg | Freq: Once | INTRAVENOUS | Status: AC
  Administered 2021-06-10: 200 mg via INTRAVENOUS
  Filled 2021-06-10: qty 8

## 2021-06-10 NOTE — Telephone Encounter (Signed)
Scheduled infusion at this time, per patient requested .

## 2021-06-10 NOTE — Progress Notes (Signed)
[facility] OFFICE PROGRESS NOTE  Patient Care Team: [name], MD as PCP - General (Family Medicine) Reynold Bowen, MD as Consulting Physician (Endocrinology)  ASSESSMENT & PLAN:  Endometrial cancer [facility]) Her last PET CT imaging showed no signs of cancer progression We will continue current treatment with pembrolizumab/Lenvima indefinitely I will space out her next imaging study to 6 months, due in April 2023  Pancytopenia, acquired [facility]) This is due to side effects of treatment She is not symptomatic Observe only  Essential hypertension Her blood pressure is stable She will continue Lenvima and her current blood pressure treatment  Orders Placed This Encounter  Procedures   TSH    Standing Status:   Standing    Number of Occurrences:   22    Standing Expiration Date:   06/10/2022    All questions were answered. The patient knows to call the clinic with any problems, questions or concerns. The total time spent in the appointment was 20 minutes encounter with patients including review of chart and various tests results, discussions about plan of care and coordination of care plan   [name], MD 06/10/2021 4:57 PM  INTERVAL HISTORY: Please see below for problem oriented charting. she returns for treatment follow-up on pembrolizumab and Lenvima for recurrent metastatic uterine cancer She is doing well No new side effects Her documented blood pressure from home were within normal range  REVIEW OF SYSTEMS:   Constitutional: Denies fevers, chills or abnormal weight loss Eyes: Denies blurriness of vision Ears, nose, mouth, throat, and face: Denies mucositis or sore throat Respiratory: Denies cough, dyspnea or wheezes Cardiovascular: Denies palpitation, chest discomfort or lower extremity swelling Gastrointestinal:  Denies nausea, heartburn or change in bowel habits Skin: Denies abnormal skin rashes Lymphatics: Denies new lymphadenopathy or easy  bruising Neurological:Denies numbness, tingling or new weaknesses Behavioral/Psych: Mood is stable, no new changes  All other systems were reviewed with the patient and are negative.  I have reviewed the past medical history, past surgical history, social history and family history with the patient and they are unchanged from previous note.  ALLERGIES:  has no active allergies.  MEDICATIONS:  Current Outpatient Medications  Medication Sig Dispense Refill   amLODipine (NORVASC) 10 MG tablet TAKE 1 TABLET BY MOUTH ONCE DAILY. **DECREASE SIMVASTATIN TO 20 MG DAILY WHILE ON AMLODIPINE PER MD** 90 tablet 1   busPIRone (BUSPAR) 10 MG tablet TAKE 1 TABLET BY MOUTH TWICE DAILY 180 tablet 0   busPIRone (BUSPAR) 10 MG tablet Take 1 tablet by mouth 2 times daily 180 tablet 0   Calcium Carb-Cholecalciferol 500-600 MG-UNIT TABS Take 1 tablet by mouth daily.      Cholecalciferol (VITAMIN D3) 2000 units TABS Take 2,000 Units by mouth daily.      cyanocobalamin (,VITAMIN B-12,) 1000 MCG/ML injection Inject 1 mL (1,000 mcg total) into the muscle every 30 (thirty) days. 1 mL 11   hydrochlorothiazide (MICROZIDE) 12.5 MG capsule Take 1 capsule (12.5 mg total) by mouth daily. 90 capsule 3   lenvatinib 10 mg daily dose (LENVIMA, 10 MG DAILY DOSE,) capsule TAKE 1 CAPSULE (10 MG TOTAL) BY MOUTH DAILY. 30 each 11   levothyroxine (SYNTHROID) 112 MCG tablet Take 1 tablet by mouth daily on an empty stomach 30 mins before a meal 90 tablet 3   levothyroxine (SYNTHROID) 75 MCG tablet TAKE 1 TABLET BY MOUTH DAILY BEFORE BREAKFAST. 90 tablet 2   LORazepam (ATIVAN) 0.5 MG tablet Take 0.5 mg by mouth 2 (two)  times daily as needed.  0   losartan (COZAAR) 25 MG tablet TAKE 1 TABLET BY MOUTH DAILY 90 tablet 3   metoprolol tartrate (LOPRESSOR) 25 MG tablet TAKE 1 TABLET BY MOUTH TWICE DAILY 180 tablet 3   ondansetron (ZOFRAN) 8 MG tablet Take 1 tablet (8 mg total) by mouth every 8 (eight) hours as needed for nausea. 30 tablet 3    pantoprazole (PROTONIX) 40 MG tablet TAKE 1 TABLET BY MOUTH ONCE DAILY 90 tablet 3   simvastatin (ZOCOR) 20 MG tablet Take 1 tablet (20 mg total) by mouth daily in the evening. 90 tablet 1   tacrolimus (PROTOPIC) 0.1 % ointment Apply 1 application topically daily as needed (rash).      tacrolimus (PROTOPIC) 0.1 % ointment Apply one application topically twice daily. 30 g 1   venlafaxine XR (EFFEXOR XR) 150 MG 24 hr capsule Take 2 capsules (300 mg total) by mouth every morning with food 180 capsule 0   venlafaxine XR (EFFEXOR XR) 150 MG 24 hr capsule Take 2 capsules by mouth once a day in the morning with food. 180 capsule 0   venlafaxine XR (EFFEXOR-XR) 150 MG 24 hr capsule Take 2 capsules by mouth each morning with food. 180 capsule 1   No current facility-administered medications for this visit.   Facility-Administered Medications Ordered in Other Visits  Medication Dose Route Frequency Provider Last Rate Last Admin   0.9 %  sodium chloride infusion   Intravenous Once [name], MD        SUMMARY OF ONCOLOGIC HISTORY: Oncology History Overview Note  [facility] testing done 11-2015 on surgical path from 2014:    MS stable   TMB low  4 muts/mb   ATM [id number]   ERBB3 T389K   E2H2 rearrangement exon 9   PPP2R1A P179R   TP53 I195N    ER- APPROXIMATELY 25-35% STAINING IN NEOPLASTIC CELLS (INTERMEDIATE)  PR- APPROXIMATELY 25-35% STAINING IN NEOPLASTIC CELLS (STRONG)  Repeat biopsy 04/02/17: ER negative, Her 2 negative  Progressed on Doxil   Endometrial cancer ([place])  06/20/2012 Pathology Results   Biopsy positive for papillary serous carcinoma   06/20/2012 Genetic Testing   [facility] One testing done 11-2015 on surgical path from 2014:    MS stable   TMB low  4 muts/mb   ATM [id number]   ERBB3 T389K   E2H2 rearrangement exon 9   PPP2R1A P179R   TP53 I195N    06/20/2012 Initial Diagnosis   Patient presented to PCP with intermittent vaginal bleeding since ~ Oct 2013,  endometrial biopsy 06-20-12 with complex endometrial hyperplasia with atypia   06/26/2012 Imaging   Thickened endometrial lining in a postmenopausal patient experiencing vaginal bleeding. In the setting of post-menopausal bleeding, endometrial sampling is indicated to exclude carcinoma. No focal myometrial abnormalities are seen.  Normal left ovary and non-visualized right ovary   07/30/2012 Surgery   Dr. Polly Cobia performed robotic hysterectomy with bilateral salpingo-oophorectomy, bilateral pelvic lymph node dissection and periaortic lymph node dissection. Intraoperatively on frozen section, the patient was noted to have a large endometrial polyp with changes within the polyp consistent for high-grade malignancy, possibly papillary serous carcinoma. There is no obvious extrauterine disease noted.     07/30/2012 Pathology Results   [phone] SUPPLEMENTAL REPORT  THE ENDOMETRIAL CARCINOMA WAS ANALYZED FOR DNA MISMATCH REPAIR PROTEINS.  IMMUNOHISTOCHEMICALLY, THE NEOPLASM RETAINED NUCLEAR EXPRESSION OF 4 GENE PRODUCTS, MLH1, MSH2, MSH6, AND PMS2, INVOLVED IN DNA MISMATCH REPAIR.  POSITIVE AND NEGATIVE CONTROLS WORKED APPROPRIATELY.  PER REQUEST, AN ER AND PR ARE PERFORMED ON BLOCK 1I.  ER- APPROXIMATELY 25-35% STAINING IN NEOPLASTIC CELLS (INTERMEDIATE)  PR- APPROXIMATELY 25-35% STAINING IN NEOPLASTIC CELLS (STRONG)  ER AND PR PREDOMINANTLY SHOW STAINING IN THE SEROUS COMPONENT.  DIAGNOSIS  1. UTERUS, CERVIX WITH BILATERAL FALLOPIAN TUBES AND OVARIES,  HYSTERECTOMY AND BILATERAL SALPINGO-OOPHORECTOMY:  HIGH GRADE/POORLY DIFFERENTIATED ENDOMETRIAL ADENOCARCINOMA WITH SOLID AND SEROUS PAPILLARY COMPONENTS.  HISTOPATHOLOGIC TYPE:    A VARIETY OF PATTERNS WERE PRESENT, ALL OF WHICH SHOULD BE CONSIDERED TO BE HIGH GRADE.  SEROUS PAPILLARY CARCINOMA WAS SEEN OCCURRING ADJACENT TO SOLID ENDOMETRIAL CARCINOMA WITH MARKED ANAPLASIA AND GIANT CELLS. SIZE: TUMOR MEASURED AT LEAST 4.8 CM IN GREATEST  HORIZONTAL DIMENSION.  GRADE: POORLY DIFFERENTIATED OR HIGH GRADE. DEPTH OF INVASION: NO DEFINITE MYOMETRIAL INVOLVEMENT WAS SEEN.  THE TUMOR APPEARED CONFINED TO THE POLYP AS WELL AS SURFACE ENDOMETRIUM. WHERE MYOMETRIAL THICKNESS NOT APPLICABLE. SEROSAL INVOLVEMENT:       NOT DEMONSTRATED.  ENDOCERVICAL INVOLVEMENT:  NOT DEMONSTRATED. RESECTION MARGINS:         FREE OF INVOLVEMENT. EXTRAUTERINE EXTENSION:    NOT DEMONSTRATED. ANGIOLYMPHATIC INVASION:   NOT DEFINITELY SEEN. TOTAL NODES EXAMINED:      22.    PELVIC NODES EXAMINED:  20.    PELVIC NODES INVOLVED:  0.    PARA-AORTIC NODES       2. EXAMINED:    PARA-AORTIC NODES       0. INVOLVED TNM STAGE:                 T1A N0 MX. AJCC STAGE GROUPING:       IA. FIGO STAGE:                IA.   08/26/2012 Imaging   No CT evidence for intra-abdominal or pelvic metastatic disease. Trace free pelvic fluid, presumably postoperative although the date of surgery is not documented in the electronic medical record.   08/26/2012 - 12/13/2012 Chemotherapy   She received 6 cycles of carbo/taxol    10/29/2012 - 11/27/2012 Radiation Therapy   May 27, June 5,  June 11, June 19, November 27, 2012: Proximal vagina 30 Gy in 5 fractions      01/17/2013 Imaging   1.  No evidence of recurrent or metastatic disease. 2.  No acute abnormality involving the abdomen or pelvis. 3.  Mild diffuse hepatic steatosis. 4.  Very small supraumbilical midline anterior abdominal wall hernia containing fat, unchanged   01/22/2014 Imaging   No evidence for metastatic or recurrent disease. 2. No bowel obstruction.  Normal appendix. 3. Small fat containing hernia, stable in appearance. 4. Status post hysterectomy and bilateral oophorectomy   02/19/2014 Imaging   No pulmonary lesions are identified. The abnormality on the chest x-ray is due to asymmetric left-sided sternoclavicular joint degenerative disease   10/12/2014 Imaging   New mild retroperitoneal lymphadenopathy in the  left paraaortic region and proximal left common iliac chain, consistent with metastatic disease. No other sites of metastatic disease identified within the abdomen or pelvis.       11/27/2014 - 02/11/2015 Chemotherapy   She received 4 cycles of carbo/taxol    03/01/2015 PET scan   Single hypermetabolic small retroperitoneal lymph node along the aorta. 2. No evidence of metastatic disease otherwise in the abdomen or pelvis. No evidence local recurrence. 3. Intensely hypermetabolic enlarged nodule adjacent to the RIGHT lobe of thyroid gland. This presumably represents the biopsied lesion in clinician report which was found to be benign  thyroid tissue.   04/05/2015 - 05/13/2015 Radiation Therapy   She received 50.4 gray in 28 fractions with simultaneous integrated boost to 56 gray   06/17/2015 Imaging   No acute process or evidence of metastatic disease in the abdomen or pelvis. Resolution of previously described retroperitoneal adenopathy. 2.  Possible constipation. 3. Atherosclerosis.   06/17/2015 Tumor Marker   Patient's tumor was tested for the following markers: CA125 Results of the tumor marker test revealed 33   08/12/2015 Tumor Marker   Patient's tumor was tested for the following markers: CA125 Results of the tumor marker test revealed 52   08/31/2015 PET scan   Development of right paratracheal hypermetabolic adenopathy, consistent with nodal metastasis. 2. The previously described isolated abdominal retroperitoneal hypermetabolic node has resolved. 3. Persistent hypermetabolic right thyroid nodule, per report previously biopsied. Correlate with those results. 4.  Possible constipation.   09/09/2015 -  Anti-estrogen oral therapy   She has been receiving alternative treatment between megace and tamoxifen   09/09/2015 Tumor Marker   Patient's tumor was tested for the following markers: CA125 Results of the tumor marker test revealed 37.8   09/20/2015 Procedure   Technically successful  ultrasound-guided thyroid aspiration biopsy , dominant right nodule   09/20/2015 Pathology Results   THYROID, RIGHT, FINE NEEDLE ASPIRATION (SPECIMEN 1 OF 1 COLLECTED 09/20/15): FINDINGS CONSISTENT WITH BENIGN THYROID NODULE (BETHESDA CATEGORY II).   10/28/2015 Tumor Marker   Patient's tumor was tested for the following markers: CA125 Results of the tumor marker test revealed 53.2   11/04/2015 Imaging   Stable benign right thyroid nodule   12/16/2015 Tumor Marker   Patient's tumor was tested for the following markers: CA125 Results of the tumor marker test revealed 38.1   03/22/2016 PET scan   Interval progression of hypermetabolic right paratracheal lymph node consistent with metastatic involvement. 2. Stable hypermetabolic right thyroid nodule. Reportedly this has been biopsied in the past.   03/23/2016 Tumor Marker   Patient's tumor was tested for the following markers: CA125 Results of the tumor marker test revealed 62.4   04/13/2016 - 06/01/2016 Radiation Therapy   She received 56 Gy to the chest in 28 fractions    05/09/2016 Imaging   No evidence of left lower extremity deep vein thrombosis. No evidence of a superficial thrombosis of the greater and lesser saphenous veins. Positive for thrombus noted in several varicosities of the calf. No evidence of Baker's cyst on the left.   05/17/2016 Tumor Marker   Patient's tumor was tested for the following markers: CA125 Results of the tumor marker test revealed 9   06/29/2016 Tumor Marker   Patient's tumor was tested for the following markers: CA125 Results of the tumor marker test revealed 5.9   08/04/2016 Tumor Marker   Patient's tumor was tested for the following markers: CA125 Results of the tumor marker test revealed 6.0   09/12/2016 PET scan   Complete metabolic response to therapy, with resolution of hypermetabolic mediastinal lymphadenopathy since prior exam. No residual or new metastatic disease identified. Stable  hypermetabolic right thyroid lobe nodule, which was previously biopsied on 09/20/2015.   03/13/2017 PET scan   1. Solitary focus of recurrent right paratracheal hypermetabolic activity, with a 1.0 cm right lower paratracheal node having a maximum SUV of 9.0. Appearance compatible with recurrent malignancy. 2. Continued hypermetabolic right thyroid nodule, previously biopsied, presumed benign -correlate with prior biopsy results. 3. Other imaging findings of potential clinical significance: Aortic Atherosclerosis (ICD10-I70.0). Mild cardiomegaly. Prominent stool throughout the  colon favors constipation.   04/02/2017 Pathology Results   FINE NEEDLE ASPIRATION, ENDOSCOPIC, (EBUS) 4 R NODE (SPECIMEN 1 OF 2 COLLECTED 04/02/17): MALIGNANT CELLS PRESENT, CONSISTENT WITH CARCINOMA. SEE COMMENT. COMMENT: THE MALIGNANT CELLS ARE POSITIVE FOR P53 AND NEGATIVE FOR ESTROGEN RECEPTOR AND TTF-1. THIS PROFILE IS NON-SPECIFIC, BUT THE P53 POSITIVE STAINING IS SUGGESTIVE OF GYNECOLOGIC PRIMARY.    04/11/2017 Procedure   Successful 8 French right internal jugular vein power port placement with its tip at the SVC/RA junction   04/12/2017 Imaging   Normal LV size with mild LV hypertrophy. EF 55-60%. Normal RV size and systolic function. Aortic valve sclerosis without significant stenosis.   04/18/2017 - 06/14/2017 Chemotherapy   The patient had chemotherapy with Doxil    07/10/2017 Imaging   - Left ventricle: The cavity size was normal. There was mild concentric hypertrophy. Systolic function was normal. The estimated ejection fraction was in the range of 55% to 60%. Wall motion was normal; there were no regional wall motion abnormalities. Doppler parameters are consistent with abnormal left ventricular relaxation (grade 1 diastolic dysfunction). - Aortic valve: Noncoronary cusp mobility was mildly restricted. - Mitral valve: There was mild regurgitation. - Left atrium: The atrium was mildly dilated. - Atrial  septum: No defect or patent foramen ovale was identified.  Impressions:  - Compared to November 2018, global LV longitudinal strain remains normal (has increased)   07/11/2017 PET scan   Increased size and hypermetabolic activity of 3.3 cm right thyroid lobe nodule. Thyroid carcinoma cannot be excluded. Recommend repeat ultrasound guided fine needle aspiration to exclude thyroid carcinoma.  New adjacent hypermetabolic 10 mm right supraclavicular lymph node, suspicious for lymph node metastasis.  Slight increase in size and hypermetabolic activity of solitary right paratracheal lymph node.  No evidence of abdominal or pelvic metastatic disease.   07/18/2017 Procedure   1. Technically successful ultrasound guided fine needle aspiration of indeterminate hypermetabolic right-sided thyroid nodule/mass. 2. Technically successful ultrasound-guided core needle biopsy of hypermetabolic right lower cervical lymph node.   07/18/2017 Pathology Results   THYROID, FINE NEEDLE ASPIRATION RIGHT (SPECIMEN 1 OF 1, COLLECTED ON 07/18/2017): ATYPIA OF UNDETERMINED SIGNIFICANCE OR FOLLICULAR LESION OF UNDETERMINED SIGNIFICANCE (BETHESDA CATEGORY III). SEE COMMENT. COMMENT: THE SPECIMEN CONSISTS OF SMALL AND MEDIUM SIZED GROUPS OF FOLLICULAR EPITHELIAL CELLS WITH MILD CYTOLOGIC ATYPIA INCLUDING NUCLEAR ENLARGEMENT AND HURTHLE CELL CHANGE. SOME GROUPS ARE ARRANGED AS MICROFOLLICLES. THERE IS MINIMAL BACKGROUND COLLOID. BASED ON THESE FEATURES, A FOLLICULAR LESION/NEOPLASM CAN NOT BE ENTIRELY RULED OUT. A SPECIMEN WILL BE SENT FOR AFIRMA TESTING.   07/19/2017 Pathology Results   Lymph node, needle/core biopsy - METASTATIC PAPILLARY SEROUS CARCINOMA. - SEE COMMENT. Microscopic Comment Dr. Vicente Males has reviewed the case and concurs with this interpretation   10/15/2017 PET scan   No new or progressive disease. No evidence of abdominal or pelvic metastatic disease.  Stable hypermetabolic right thyroid lobe  nodule and adjacent right supraclavicular lymph node.  Decreased size and hypermetabolic activity of solitary right paratracheal lymph node.   01/23/2018 PET scan   1. Overall, no significant change from PET-CT of 3 months ago. There is persistent hypermetabolic activity at the right thoracic inlet and within a right paratracheal mediastinal node. The nodes have not significantly changed in size, although the metabolic activity has minimally increased over this interval. It is uncertain how much of the thoracic inlet metabolic activity is attributed to the thyroid nodule versus adjacent cervical lymph nodes, both previously biopsied. 2. No new hypermetabolic activity within the  neck, chest, abdomen or pelvis. No evidence of local recurrence in the pelvis. 3. Stable probable radiation changes in the right lung.   04/16/2018 PET scan   1. Interval increase in metabolic activity of the RIGHT supraclavicular node and RIGHT lower paratracheal mediastinal node with minimal change in size. Findings consistent with persistent and mildly progressed metastatic adenopathy. 2. New hypermetabolic small lymph node in the RIGHT lower paratracheal nodal station adjacent to the previously followed node. 3. No evidence of local recurrence in the pelvis or new disease elsewhere.   07/30/2018 PET scan   1. Mild interval progression of right supraclavicular, mediastinal, and right hilar hypermetabolic metastatic disease. There is a new hypermetabolic lymph node in the subcarinal station on today's study. 2. No hypermetabolic disease in the abdomen or pelvis to suggest recurrent/metastases. 3.  Aortic Atherosclerois (ICD10-170.0)     08/16/2018 -  Chemotherapy   The patient had Lenvima since 3/6 and pembrolizumab since 3/13 for chemotherapy treatment. Michel Santee was held temporarily due to infection and severe hypertension   10/31/2018 PET scan   Partial response to therapy, with decreased hypermetabolic lymphadenopathy  in mediastinum and right hilar region.   No significant change in hypermetabolic lymphadenopathy in right inferior neck.   No new or progressive metastatic disease identified. No evidence of recurrent or metastatic carcinoma in the pelvis or abdomen   02/04/2019 PET scan   1. Decrease in metabolic activity of RIGHT supraclavicular node and RIGHT paratracheal lymph nodes. 2. Persistent activity in the RIGHT thyroid nodule unchanged. 3. Diffuse activity through the esophagus is favored esophagitis. No change. 4. Scattered foci of intense metabolic activity associated the bowel without focal lesion on CT favored benign. 5. No evidence of peritoneal metastasis. 6. No evidence of metastatic adenopathy in the abdomen pelvis. 7. No evidence of local pelvic sidewall recurrence.   07/30/2019 PET scan   1. Mildly increased uptake within small nodes along the right sternocleidomastoid without change in size and associated with increased deltoid activity may reflect changes related to right arm injection or recent COVID vaccination, consider clinical correlation and attention on follow-up. 2. Persistent activity in the right thyroid nodule, unchanged from previous exams. 3. New activity in a juxta esophageal lymph node in the chest is suspicious though the node is unchanged with respect to size. Endoscopic correlation could potentially be helpful or close attention on follow-up. 4. Subtle increased FDG uptake along the left mainstem bronchus and adjacent to the aorta may represent a small lymph node. No discrete correlate is demonstrated on today's study.   10/22/2019 PET scan   1. Persistent FDG avid subcentimeter right supraclavicular and juxta esophageal lymph nodes. The degree of FDG uptake is similar to the previous exam. No new or progressive findings identified. 2. No findings of solid organ metastasis, skeletal metastasis or metastatic disease to the abdomen or pelvis. 3. FDG avid right lobe of  thyroid gland nodule is again noted This nodule is not incidental and been worked up previously with 2 biopsies. Please refer to results from the most recent biopsy dated 07/18/2017. (Ref: J Am Coll Radiol. 2015 Feb;12(2): 143-50). 4.  Aortic Atherosclerosis (ICD10-I70.0).   05/05/2020 PET scan   1. Progressive disease, as evidenced by increased hypermetabolism within right supraclavicular and mediastinal nodes. 2. No infradiaphragmatic hypermetabolic metastasis. 3.  Aortic Atherosclerosis (ICD10-I70.0). 4. Hypermetabolic right thyroid nodule has been detailed on prior exams and was biopsied on 07/18/2017   09/10/2020 PET scan   1. Interval improvement in previously demonstrated hypermetabolic  mediastinal and right supraclavicular adenopathy. A single hypermetabolic left paraesophageal lymph node remains, improved from previous study. The other nodes have resolved.  2. No progressive disease. 3. Diffuse esophageal hypermetabolic activity suggesting esophagitis. 4. Hypermetabolic right thyroid nodule has been previously biopsied.   03/18/2021 PET scan   1. No significant change in single residual FDG avid left paraesophageal lymph node. This has an SUV max of 7.03, compared with 6.4 previously. No new sites of disease identified. 2. Diffuse esophageal hypermetabolic activity suggestive of esophagitis. 3. FDG avid right lobe of thyroid gland nodule. This has been biopsied previously. (Ref: J Am Coll Radiol. 2015 Feb;12(2): 143-50).     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 Lenvima was held temporarily due to infection and severe hypertension     PHYSICAL EXAMINATION: ECOG PERFORMANCE STATUS: 0 - Asymptomatic  Vitals:   06/10/21 1026  BP: (!) 141/85  Pulse: 65  Resp: 18  Temp:  97.7 F (36.5 C)  SpO2: 97%   Filed Weights   06/10/21 1026  Weight: 148 lb 12.8 oz (67.5 kg)    GENERAL:alert, no distress and comfortable SKIN: skin color, texture, turgor are normal, no rashes or significant lesions EYES: normal, Conjunctiva are pink and non-injected, sclera clear OROPHARYNX:no exudate, no erythema and lips, buccal mucosa, and tongue normal  NECK: supple, thyroid normal size, non-tender, without nodularity LYMPH:  no palpable lymphadenopathy in the cervical, axillary or inguinal LUNGS: clear to auscultation and percussion with normal breathing effort HEART: regular rate & rhythm and no murmurs and no lower extremity edema ABDOMEN:abdomen soft, non-tender and normal bowel sounds Musculoskeletal:no cyanosis of digits and no clubbing  NEURO: alert & oriented x 3 with fluent speech, no focal motor/sensory deficits  LABORATORY DATA:  I have reviewed the data as listed    Component Value Date/Time   NA 136 06/10/2021 1009   NA 141 05/17/2017 0957   K 4.0 06/10/2021 1009   K 3.6 05/17/2017 0957   CL 101 06/10/2021 1009   CL 105 11/19/2012 0848   CO2 29 06/10/2021 1009   CO2 25 05/17/2017 0957   GLUCOSE 83 06/10/2021 1009   GLUCOSE 85 05/17/2017 0957   GLUCOSE 122 (H) 11/19/2012 0848   BUN 22 06/10/2021 1009   BUN 15.2 05/17/2017 0957   CREATININE 0.82 06/10/2021 1009   CREATININE 0.84 03/18/2021 1003   CREATININE 0.8 05/17/2017 0957   CALCIUM 9.7 06/10/2021 1009   CALCIUM 9.6 05/17/2017 0957   PROT 7.2 06/10/2021 1009   PROT 7.0 05/17/2017 0957   ALBUMIN 4.0 06/10/2021 1009   ALBUMIN 3.9 05/17/2017 0957   AST 21 06/10/2021 1009   AST 24 03/18/2021 1003   AST 25 05/17/2017 0957   ALT 17 06/10/2021 1009   ALT 21 03/18/2021 1003   ALT 18 05/17/2017 0957   ALKPHOS 47 06/10/2021 1009   ALKPHOS 57 05/17/2017 0957   BILITOT 0.6 06/10/2021 1009   BILITOT 0.6 03/18/2021 1003   BILITOT 0.47 05/17/2017 0957   GFRNONAA >60 06/10/2021 1009   GFRNONAA >60  03/18/2021 1003   GFRAA >60 02/26/2020 0940   GFRAA >60 07/10/2019 1240    No results found for: SPEP, UPEP  Lab Results  Component Value Date   WBC 3.6 (L) 06/10/2021   NEUTROABS 2.4 06/10/2021   HGB 10.5 (  L) 06/10/2021   HCT 32.4 (L) 06/10/2021   MCV 95.9 06/10/2021   PLT 179 06/10/2021      Chemistry      Component Value Date/Time   NA 136 06/10/2021 1009   NA 141 05/17/2017 0957   K 4.0 06/10/2021 1009   K 3.6 05/17/2017 0957   CL 101 06/10/2021 1009   CL 105 11/19/2012 0848   CO2 29 06/10/2021 1009   CO2 25 05/17/2017 0957   BUN 22 06/10/2021 1009   BUN 15.2 05/17/2017 0957   CREATININE 0.82 06/10/2021 1009   CREATININE 0.84 03/18/2021 1003   CREATININE 0.8 05/17/2017 0957   GLU 158 (H) 01/14/2015 1035      Component Value Date/Time   CALCIUM 9.7 06/10/2021 1009   CALCIUM 9.6 05/17/2017 0957   ALKPHOS 47 06/10/2021 1009   ALKPHOS 57 05/17/2017 0957   AST 21 06/10/2021 1009   AST 24 03/18/2021 1003   AST 25 05/17/2017 0957   ALT 17 06/10/2021 1009   ALT 21 03/18/2021 1003   ALT 18 05/17/2017 0957   BILITOT 0.6 06/10/2021 1009   BILITOT 0.6 03/18/2021 1003   BILITOT 0.47 05/17/2017 0957

## 2021-06-10 NOTE — Assessment & Plan Note (Signed)
Her blood pressure is stable She will continue Lenvima and her current blood pressure treatment 

## 2021-06-10 NOTE — Assessment & Plan Note (Signed)
This is due to side effects of treatment She is not symptomatic Observe only 

## 2021-06-10 NOTE — Assessment & Plan Note (Signed)
Her last PET CT imaging showed no signs of cancer progression We will continue current treatment with pembrolizumab/Lenvima indefinitely I will space out her next imaging study to 6 months, due in April 2023

## 2021-06-15 ENCOUNTER — Other Ambulatory Visit (HOSPITAL_COMMUNITY): Payer: Self-pay

## 2021-06-15 DIAGNOSIS — F419 Anxiety disorder, unspecified: Secondary | ICD-10-CM | POA: Diagnosis not present

## 2021-06-15 MED ORDER — BUSPIRONE HCL 10 MG PO TABS
ORAL_TABLET | ORAL | 3 refills | Status: DC
  Filled 2021-06-15 – 2021-09-06 (×2): qty 180, 90d supply, fill #0
  Filled 2021-12-14: qty 180, 90d supply, fill #1
  Filled 2022-03-15: qty 180, 90d supply, fill #2

## 2021-06-15 MED ORDER — VENLAFAXINE HCL ER 150 MG PO CP24
ORAL_CAPSULE | ORAL | 3 refills | Status: DC
  Filled 2021-08-03: qty 180, 90d supply, fill #0
  Filled 2021-11-04: qty 26, 14d supply, fill #1
  Filled 2021-11-07: qty 154, 76d supply, fill #1
  Filled 2022-02-01 – 2022-02-08 (×2): qty 180, 90d supply, fill #2
  Filled 2022-05-09: qty 180, 90d supply, fill #3

## 2021-06-20 ENCOUNTER — Other Ambulatory Visit (HOSPITAL_COMMUNITY): Payer: Self-pay

## 2021-06-27 ENCOUNTER — Other Ambulatory Visit (HOSPITAL_COMMUNITY): Payer: Self-pay

## 2021-07-01 ENCOUNTER — Other Ambulatory Visit: Payer: Self-pay

## 2021-07-01 ENCOUNTER — Inpatient Hospital Stay: Payer: 59

## 2021-07-01 VITALS — BP 110/60 | HR 67 | Temp 98.6°F | Resp 18

## 2021-07-01 DIAGNOSIS — Z7189 Other specified counseling: Secondary | ICD-10-CM

## 2021-07-01 DIAGNOSIS — Z79899 Other long term (current) drug therapy: Secondary | ICD-10-CM | POA: Diagnosis not present

## 2021-07-01 DIAGNOSIS — C541 Malignant neoplasm of endometrium: Secondary | ICD-10-CM

## 2021-07-01 DIAGNOSIS — C770 Secondary and unspecified malignant neoplasm of lymph nodes of head, face and neck: Secondary | ICD-10-CM

## 2021-07-01 DIAGNOSIS — E039 Hypothyroidism, unspecified: Secondary | ICD-10-CM

## 2021-07-01 DIAGNOSIS — Z5112 Encounter for antineoplastic immunotherapy: Secondary | ICD-10-CM | POA: Diagnosis not present

## 2021-07-01 LAB — COMPREHENSIVE METABOLIC PANEL
ALT: 15 U/L (ref 0–44)
AST: 19 U/L (ref 15–41)
Albumin: 4.1 g/dL (ref 3.5–5.0)
Alkaline Phosphatase: 46 U/L (ref 38–126)
Anion gap: 8 (ref 5–15)
BUN: 21 mg/dL (ref 8–23)
CO2: 28 mmol/L (ref 22–32)
Calcium: 9.6 mg/dL (ref 8.9–10.3)
Chloride: 101 mmol/L (ref 98–111)
Creatinine, Ser: 0.96 mg/dL (ref 0.44–1.00)
GFR, Estimated: >60 mL/min (ref >60)
Glucose, Bld: 96 mg/dL (ref 70–99)
Potassium: 4.1 mmol/L (ref 3.5–5.1)
Sodium: 137 mmol/L (ref 135–145)
Total Bilirubin: 0.5 mg/dL (ref 0.3–1.2)
Total Protein: 7.1 g/dL (ref 6.5–8.1)

## 2021-07-01 LAB — CBC WITH DIFFERENTIAL/PLATELET
Abs Immature Granulocytes: 0.01 10*3/uL (ref 0.00–0.07)
Basophils Absolute: 0.1 10*3/uL (ref 0.0–0.1)
Basophils Relative: 2 %
Eosinophils Absolute: 0.2 10*3/uL (ref 0.0–0.5)
Eosinophils Relative: 7 %
HCT: 32.6 % — ABNORMAL LOW (ref 36.0–46.0)
Hemoglobin: 10.5 g/dL — ABNORMAL LOW (ref 12.0–15.0)
Immature Granulocytes: 0 %
Lymphocytes Relative: 20 %
Lymphs Abs: 0.8 10*3/uL (ref 0.7–4.0)
MCH: 31 pg (ref 26.0–34.0)
MCHC: 32.2 g/dL (ref 30.0–36.0)
MCV: 96.2 fL (ref 80.0–100.0)
Monocytes Absolute: 0.3 10*3/uL (ref 0.1–1.0)
Monocytes Relative: 8 %
Neutro Abs: 2.4 10*3/uL (ref 1.7–7.7)
Neutrophils Relative %: 63 %
Platelets: 184 10*3/uL (ref 150–400)
RBC: 3.39 MIL/uL — ABNORMAL LOW (ref 3.87–5.11)
RDW: 13.2 % (ref 11.5–15.5)
WBC: 3.7 10*3/uL — ABNORMAL LOW (ref 4.0–10.5)
nRBC: 0 % (ref 0.0–0.2)

## 2021-07-01 LAB — TSH: TSH: 1.303 u[IU]/mL (ref 0.308–3.960)

## 2021-07-01 MED ORDER — SODIUM CHLORIDE 0.9 % IV SOLN
200.0000 mg | Freq: Once | INTRAVENOUS | Status: AC
  Administered 2021-07-01: 200 mg via INTRAVENOUS
  Filled 2021-07-01: qty 200

## 2021-07-01 MED ORDER — SODIUM CHLORIDE 0.9 % IV SOLN: Freq: Once | INTRAVENOUS | Status: AC

## 2021-07-01 MED ORDER — SODIUM CHLORIDE 0.9% FLUSH
10.0000 mL | INTRAVENOUS | Status: DC | PRN
  Administered 2021-07-01: 10 mL

## 2021-07-01 MED ORDER — HEPARIN SOD (PORK) LOCK FLUSH 100 UNIT/ML IV SOLN
500.0000 [IU] | Freq: Once | INTRAVENOUS | Status: AC | PRN
  Administered 2021-07-01: 500 [IU]

## 2021-07-01 NOTE — Patient Instructions (Signed)
[facility]  Discharge Instructions: Thank you for choosing [facility] to provide your oncology and hematology care.   If you have a lab appointment with the Cancer Center, please go directly to the Cancer Center and check in at the registration area.   Wear comfortable clothing and clothing appropriate for easy access to any Portacath or PICC line.   We strive to give you quality time with your provider. You may need to reschedule your appointment if you arrive late (15 or more minutes).  Arriving late affects you and other patients whose appointments are after yours.  Also, if you miss three or more appointments without notifying the office, you may be dismissed from the clinic at the provider's discretion.      For prescription refill requests, have your pharmacy contact our office and allow 72 hours for refills to be completed.    Today you received the following chemotherapy and/or immunotherapy agents: keytruda      To help prevent nausea and vomiting after your treatment, we encourage you to take your nausea medication as directed.  BELOW ARE SYMPTOMS THAT SHOULD BE REPORTED IMMEDIATELY: *FEVER GREATER THAN 100.4 F (38 C) OR HIGHER *CHILLS OR SWEATING *NAUSEA AND VOMITING THAT IS NOT CONTROLLED WITH YOUR NAUSEA MEDICATION *UNUSUAL SHORTNESS OF BREATH *UNUSUAL BRUISING OR BLEEDING *URINARY PROBLEMS (pain or burning when urinating, or frequent urination) *BOWEL PROBLEMS (unusual diarrhea, constipation, pain near the anus) TENDERNESS IN MOUTH AND THROAT WITH OR WITHOUT PRESENCE OF ULCERS (sore throat, sores in mouth, or a toothache) UNUSUAL RASH, SWELLING OR PAIN  UNUSUAL VAGINAL DISCHARGE OR ITCHING   Items with * indicate a potential emergency and should be followed up as soon as possible or go to the Emergency Department if any problems should occur.  Please show the CHEMOTHERAPY ALERT CARD or IMMUNOTHERAPY ALERT CARD at check-in to  the Emergency Department and triage nurse.  Should you have questions after your visit or need to cancel or reschedule your appointment, please contact [facility]  Dept: [phone]  and follow the prompts.  Office hours are 8:00 a.m. to 4:30 p.m. Monday - Friday. Please note that voicemails left after 4:00 p.m. may not be returned until the following business day.  We are closed weekends and major holidays. You have access to a nurse at all times for urgent questions. Please call the main number to the clinic Dept: [phone] and follow the prompts.   For any non-urgent questions, you may also contact your provider using MyChart. We now offer e-Visits for anyone 18 and older to request care online for non-urgent symptoms. For details visit [email or web address].   Also download the MyChart app! Go to the app store, search "MyChart", open the app, select [facility], and log in with your MyChart username and password.  Due to Covid, a mask is required upon entering the hospital/clinic. If you do not have a mask, one will be given to you upon arrival. For doctor visits, patients may have 1 support person aged 18 or older with them. For treatment visits, patients cannot have anyone with them due to current Covid guidelines and our immunocompromised population.   

## 2021-07-04 ENCOUNTER — Other Ambulatory Visit (HOSPITAL_COMMUNITY): Payer: Self-pay

## 2021-07-06 MED FILL — Pantoprazole Sodium EC Tab 40 MG (Base Equiv): ORAL | 90 days supply | Qty: 90 | Fill #2 | Status: AC

## 2021-07-07 ENCOUNTER — Other Ambulatory Visit (HOSPITAL_COMMUNITY): Payer: Self-pay

## 2021-07-14 ENCOUNTER — Other Ambulatory Visit (HOSPITAL_COMMUNITY): Payer: Self-pay

## 2021-07-14 ENCOUNTER — Other Ambulatory Visit: Payer: Self-pay | Admitting: Hematology and Oncology

## 2021-07-14 MED ORDER — LIDOCAINE-PRILOCAINE 2.5-2.5 % EX CREA
TOPICAL_CREAM | CUTANEOUS | 3 refills | Status: AC
  Filled 2021-07-14: qty 30, 10d supply, fill #0

## 2021-07-14 MED FILL — Losartan Potassium Tab 25 MG: ORAL | 90 days supply | Qty: 90 | Fill #3 | Status: AC

## 2021-07-21 ENCOUNTER — Encounter: Payer: Self-pay | Admitting: Hematology and Oncology

## 2021-07-21 ENCOUNTER — Inpatient Hospital Stay: Payer: 59 | Attending: Hematology and Oncology

## 2021-07-21 ENCOUNTER — Inpatient Hospital Stay (HOSPITAL_BASED_OUTPATIENT_CLINIC_OR_DEPARTMENT_OTHER): Payer: 59 | Admitting: Hematology and Oncology

## 2021-07-21 ENCOUNTER — Other Ambulatory Visit: Payer: Self-pay

## 2021-07-21 ENCOUNTER — Inpatient Hospital Stay: Payer: 59

## 2021-07-21 VITALS — Temp 97.8°F

## 2021-07-21 DIAGNOSIS — D539 Nutritional anemia, unspecified: Secondary | ICD-10-CM

## 2021-07-21 DIAGNOSIS — Z5112 Encounter for antineoplastic immunotherapy: Secondary | ICD-10-CM | POA: Diagnosis not present

## 2021-07-21 DIAGNOSIS — C541 Malignant neoplasm of endometrium: Secondary | ICD-10-CM | POA: Diagnosis not present

## 2021-07-21 DIAGNOSIS — Z79899 Other long term (current) drug therapy: Secondary | ICD-10-CM | POA: Diagnosis not present

## 2021-07-21 DIAGNOSIS — E039 Hypothyroidism, unspecified: Secondary | ICD-10-CM

## 2021-07-21 DIAGNOSIS — Z95828 Presence of other vascular implants and grafts: Secondary | ICD-10-CM

## 2021-07-21 DIAGNOSIS — Z7189 Other specified counseling: Secondary | ICD-10-CM

## 2021-07-21 DIAGNOSIS — C770 Secondary and unspecified malignant neoplasm of lymph nodes of head, face and neck: Secondary | ICD-10-CM

## 2021-07-21 LAB — CBC WITH DIFFERENTIAL/PLATELET
Abs Immature Granulocytes: 0.01 10*3/uL (ref 0.00–0.07)
Basophils Absolute: 0.1 10*3/uL (ref 0.0–0.1)
Basophils Relative: 2 %
Eosinophils Absolute: 0.2 10*3/uL (ref 0.0–0.5)
Eosinophils Relative: 7 %
HCT: 32.7 % — ABNORMAL LOW (ref 36.0–46.0)
Hemoglobin: 10.7 g/dL — ABNORMAL LOW (ref 12.0–15.0)
Immature Granulocytes: 0 %
Lymphocytes Relative: 19 %
Lymphs Abs: 0.6 10*3/uL — ABNORMAL LOW (ref 0.7–4.0)
MCH: 31.2 pg (ref 26.0–34.0)
MCHC: 32.7 g/dL (ref 30.0–36.0)
MCV: 95.3 fL (ref 80.0–100.0)
Monocytes Absolute: 0.3 10*3/uL (ref 0.1–1.0)
Monocytes Relative: 8 %
Neutro Abs: 2.1 10*3/uL (ref 1.7–7.7)
Neutrophils Relative %: 64 %
Platelets: 165 10*3/uL (ref 150–400)
RBC: 3.43 MIL/uL — ABNORMAL LOW (ref 3.87–5.11)
RDW: 13.2 % (ref 11.5–15.5)
WBC: 3.3 10*3/uL — ABNORMAL LOW (ref 4.0–10.5)
nRBC: 0 % (ref 0.0–0.2)

## 2021-07-21 LAB — COMPREHENSIVE METABOLIC PANEL
ALT: 16 U/L (ref 0–44)
AST: 21 U/L (ref 15–41)
Albumin: 4.2 g/dL (ref 3.5–5.0)
Alkaline Phosphatase: 44 U/L (ref 38–126)
Anion gap: 7 (ref 5–15)
BUN: 20 mg/dL (ref 8–23)
CO2: 29 mmol/L (ref 22–32)
Calcium: 9.7 mg/dL (ref 8.9–10.3)
Chloride: 100 mmol/L (ref 98–111)
Creatinine, Ser: 0.81 mg/dL (ref 0.44–1.00)
GFR, Estimated: >60 mL/min (ref >60)
Glucose, Bld: 90 mg/dL (ref 70–99)
Potassium: 4.1 mmol/L (ref 3.5–5.1)
Sodium: 136 mmol/L (ref 135–145)
Total Bilirubin: 0.5 mg/dL (ref 0.3–1.2)
Total Protein: 7.2 g/dL (ref 6.5–8.1)

## 2021-07-21 LAB — TOTAL PROTEIN, URINE DIPSTICK: Protein, ur: NEGATIVE mg/dL

## 2021-07-21 LAB — TSH: TSH: 1.635 u[IU]/mL (ref 0.308–3.960)

## 2021-07-21 MED ORDER — SODIUM CHLORIDE 0.9 % IV SOLN: Freq: Once | INTRAVENOUS | Status: AC

## 2021-07-21 MED ORDER — SODIUM CHLORIDE 0.9% FLUSH
10.0000 mL | Freq: Once | INTRAVENOUS | Status: AC
  Administered 2021-07-21: 10 mL

## 2021-07-21 MED ORDER — HEPARIN SOD (PORK) LOCK FLUSH 100 UNIT/ML IV SOLN
500.0000 [IU] | Freq: Once | INTRAVENOUS | Status: AC | PRN
  Administered 2021-07-21: 500 [IU]

## 2021-07-21 MED ORDER — SODIUM CHLORIDE 0.9% FLUSH
10.0000 mL | INTRAVENOUS | Status: DC | PRN
  Administered 2021-07-21: 10 mL

## 2021-07-21 MED ORDER — SODIUM CHLORIDE 0.9 % IV SOLN
200.0000 mg | Freq: Once | INTRAVENOUS | Status: AC
  Administered 2021-07-21: 200 mg via INTRAVENOUS
  Filled 2021-07-21: qty 8

## 2021-07-21 NOTE — Assessment & Plan Note (Signed)
Her last PET CT imaging showed no signs of cancer progression We will continue current treatment with pembrolizumab/Lenvima indefinitely I will space out her next imaging study to 6 months, due in April 2023

## 2021-07-21 NOTE — Assessment & Plan Note (Signed)
This is due to side effects of treatment She is not symptomatic Observe only 

## 2021-07-21 NOTE — Progress Notes (Signed)
[facility] [place] OFFICE PROGRESS NOTE  Patient Care Team: [name], MD as PCP - General (Family Medicine) Reynold Bowen, MD as Consulting Physician (Endocrinology)  ASSESSMENT & PLAN:  Endometrial cancer [facility]) Her last PET CT imaging showed no signs of cancer progression We will continue current treatment with pembrolizumab/Lenvima indefinitely I will space out her next imaging study to 6 months, due in April 2023  Deficiency anemia This is due to side effects of treatment She is not symptomatic Observe only  Acquired hypothyroidism She has intermittent elevated TSH I will adjust her thyroid medicine accordingly  No orders of the defined types were placed in this encounter.   All questions were answered. The patient knows to call the clinic with any problems, questions or concerns. The total time spent in the appointment was 20 minutes encounter with patients including review of chart and various tests results, discussions about plan of care and coordination of care plan   [name], MD 07/21/2021 10:49 AM  INTERVAL HISTORY: Please see below for problem oriented charting. she returns for treatment follow-up on Keytruda/Lenvima for recurrent endometrial cancer She is doing very well Her blood pressure is noted to be low today but at home is within normal range She has mild intermittent right knee pain and will be seeing her doctor for management Otherwise, she tolerated treatment very well  REVIEW OF SYSTEMS:   Constitutional: Denies fevers, chills or abnormal weight loss Eyes: Denies blurriness of vision Ears, nose, mouth, throat, and face: Denies mucositis or sore throat Respiratory: Denies cough, dyspnea or wheezes Cardiovascular: Denies palpitation, chest discomfort or lower extremity swelling Gastrointestinal:  Denies nausea, heartburn or change in bowel habits Skin: Denies abnormal skin rashes Lymphatics: Denies new lymphadenopathy or easy  bruising Neurological:Denies numbness, tingling or new weaknesses Behavioral/Psych: Mood is stable, no new changes  All other systems were reviewed with the patient and are negative.  I have reviewed the past medical history, past surgical history, social history and family history with the patient and they are unchanged from previous note.  ALLERGIES:  has no active allergies.  MEDICATIONS:  Current Outpatient Medications  Medication Sig Dispense Refill   amLODipine (NORVASC) 10 MG tablet TAKE 1 TABLET BY MOUTH ONCE DAILY. **DECREASE SIMVASTATIN TO 20 MG DAILY WHILE ON AMLODIPINE PER MD** 90 tablet 1   busPIRone (BUSPAR) 10 MG tablet TAKE 1 TABLET BY MOUTH TWICE DAILY 180 tablet 0   busPIRone (BUSPAR) 10 MG tablet Take 1 tablet by mouth 2 times a day 180 tablet 3   Calcium Carb-Cholecalciferol 500-600 MG-UNIT TABS Take 1 tablet by mouth daily.      Cholecalciferol (VITAMIN D3) 2000 units TABS Take 2,000 Units by mouth daily.      cyanocobalamin (,VITAMIN B-12,) 1000 MCG/ML injection Inject 1 mL (1,000 mcg total) into the muscle every 30 (thirty) days. 1 mL 11   hydrochlorothiazide (MICROZIDE) 12.5 MG capsule Take 1 capsule (12.5 mg total) by mouth daily. 90 capsule 3   lenvatinib 10 mg daily dose (LENVIMA, 10 MG DAILY DOSE,) capsule TAKE 1 CAPSULE (10 MG TOTAL) BY MOUTH DAILY. 30 each 11   levothyroxine (SYNTHROID) 112 MCG tablet Take 1 tablet by mouth daily on an empty stomach 30 mins before a meal 90 tablet 3   levothyroxine (SYNTHROID) 75 MCG tablet TAKE 1 TABLET BY MOUTH DAILY BEFORE BREAKFAST. 90 tablet 2   lidocaine-prilocaine (EMLA) cream APPLY TO AFFECTED AREA ONCE AS DIRECTED 30 g 3   LORazepam (ATIVAN) 0.5 MG  tablet Take 0.5 mg by mouth 2 (two) times daily as needed.  0   losartan (COZAAR) 25 MG tablet TAKE 1 TABLET BY MOUTH DAILY 90 tablet 3   metoprolol tartrate (LOPRESSOR) 25 MG tablet TAKE 1 TABLET BY MOUTH TWICE DAILY 180 tablet 3   ondansetron (ZOFRAN) 8 MG tablet Take 1  tablet (8 mg total) by mouth every 8 (eight) hours as needed for nausea. 30 tablet 3   pantoprazole (PROTONIX) 40 MG tablet TAKE 1 TABLET BY MOUTH ONCE DAILY 90 tablet 3   simvastatin (ZOCOR) 20 MG tablet Take 1 tablet (20 mg total) by mouth daily in the evening. 90 tablet 1   tacrolimus (PROTOPIC) 0.1 % ointment Apply 1 application topically daily as needed (rash).      tacrolimus (PROTOPIC) 0.1 % ointment Apply one application topically twice daily. 30 g 1   venlafaxine XR (EFFEXOR XR) 150 MG 24 hr capsule Take 2 capsules (300 mg total) by mouth every morning with food 180 capsule 0   venlafaxine XR (EFFEXOR XR) 150 MG 24 hr capsule Take 2 capsules by mouth once a day in the morning with food. 180 capsule 0   venlafaxine XR (EFFEXOR XR) 150 MG 24 hr capsule Take 2 capsules by mouth in the morning with food 180 capsule 3   venlafaxine XR (EFFEXOR-XR) 150 MG 24 hr capsule Take 2 capsules by mouth each morning with food. 180 capsule 1   No current facility-administered medications for this visit.   Facility-Administered Medications Ordered in Other Visits  Medication Dose Route Frequency Provider Last Rate Last Admin   heparin lock flush 100 unit/mL  500 Units Intracatheter Once PRN Alvy Bimler, Hydie Langan, MD       pembrolizumab (KEYTRUDA) 200 mg in sodium chloride 0.9 % 50 mL chemo infusion  200 mg Intravenous Once Alvy Bimler, Dream Harman, MD       sodium chloride flush (NS) 0.9 % injection 10 mL  10 mL Intracatheter PRN [name], MD        SUMMARY OF ONCOLOGIC HISTORY: Oncology History Overview Note  [facility] testing done 11-2015 on surgical path from 2014:    MS stable   TMB low  4 muts/mb   ATM A41660   ERBB3 T389K   E2H2 rearrangement exon 9   PPP2R1A P179R   TP53 I195N    ER- APPROXIMATELY 25-35% STAINING IN NEOPLASTIC CELLS (INTERMEDIATE)  PR- APPROXIMATELY 25-35% STAINING IN NEOPLASTIC CELLS (STRONG)  Repeat biopsy 04/02/17: ER negative, Her 2 negative  Progressed on Doxil    Endometrial cancer ([place])  06/20/2012 Pathology Results   Biopsy positive for papillary serous carcinoma   06/20/2012 Genetic Testing   [facility] testing done 11-2015 on surgical path from 2014:    MS stable   TMB low  4 muts/mb   ATM Y30160   ERBB3 T389K   E2H2 rearrangement exon 9   PPP2R1A P179R   TP53 I195N    06/20/2012 Initial Diagnosis   Patient presented to PCP with intermittent vaginal bleeding since ~ Oct 2013, endometrial biopsy 06-20-12 with complex endometrial hyperplasia with atypia   06/26/2012 Imaging   Thickened endometrial lining in a postmenopausal patient experiencing vaginal bleeding. In the setting of post-menopausal bleeding, endometrial sampling is indicated to exclude carcinoma. No focal myometrial abnormalities are seen.  Normal left ovary and non-visualized right ovary   07/30/2012 Surgery   Dr. Polly Cobia performed robotic hysterectomy with bilateral salpingo-oophorectomy, bilateral pelvic lymph node dissection and periaortic lymph node dissection. Intraoperatively on  frozen section, the patient was noted to have a large endometrial polyp with changes within the polyp consistent for high-grade malignancy, possibly papillary serous carcinoma. There is no obvious extrauterine disease noted.     07/30/2012 Pathology Results   [id number] SUPPLEMENTAL REPORT  THE ENDOMETRIAL CARCINOMA WAS ANALYZED FOR DNA MISMATCH REPAIR PROTEINS.  IMMUNOHISTOCHEMICALLY, THE NEOPLASM RETAINED NUCLEAR EXPRESSION OF 4 GENE PRODUCTS, MLH1, MSH2, MSH6, AND PMS2, INVOLVED IN DNA MISMATCH REPAIR.  POSITIVE AND NEGATIVE CONTROLS WORKED APPROPRIATELY.  PER REQUEST, AN ER AND PR ARE PERFORMED ON BLOCK 1I.  ER- APPROXIMATELY 25-35% STAINING IN NEOPLASTIC CELLS (INTERMEDIATE)  PR- APPROXIMATELY 25-35% STAINING IN NEOPLASTIC CELLS (STRONG)  ER AND PR PREDOMINANTLY SHOW STAINING IN THE SEROUS COMPONENT.  DIAGNOSIS  1. UTERUS, CERVIX WITH BILATERAL FALLOPIAN TUBES AND OVARIES,   HYSTERECTOMY AND BILATERAL SALPINGO-OOPHORECTOMY:  HIGH GRADE/POORLY DIFFERENTIATED ENDOMETRIAL ADENOCARCINOMA WITH SOLID AND SEROUS PAPILLARY COMPONENTS.  HISTOPATHOLOGIC TYPE:    A VARIETY OF PATTERNS WERE PRESENT, ALL OF WHICH SHOULD BE CONSIDERED TO BE HIGH GRADE.  SEROUS PAPILLARY CARCINOMA WAS SEEN OCCURRING ADJACENT TO SOLID ENDOMETRIAL CARCINOMA WITH MARKED ANAPLASIA AND GIANT CELLS. SIZE: TUMOR MEASURED AT LEAST 4.8 CM IN GREATEST HORIZONTAL DIMENSION.  GRADE: POORLY DIFFERENTIATED OR HIGH GRADE. DEPTH OF INVASION: NO DEFINITE MYOMETRIAL INVOLVEMENT WAS SEEN.  THE TUMOR APPEARED CONFINED TO THE POLYP AS WELL AS SURFACE ENDOMETRIUM. WHERE MYOMETRIAL THICKNESS NOT APPLICABLE. SEROSAL INVOLVEMENT:       NOT DEMONSTRATED.  ENDOCERVICAL INVOLVEMENT:  NOT DEMONSTRATED. RESECTION MARGINS:         FREE OF INVOLVEMENT. EXTRAUTERINE EXTENSION:    NOT DEMONSTRATED. ANGIOLYMPHATIC INVASION:   NOT DEFINITELY SEEN. TOTAL NODES EXAMINED:      22.    PELVIC NODES EXAMINED:  20.    PELVIC NODES INVOLVED:  0.    PARA-AORTIC NODES       2. EXAMINED:    PARA-AORTIC NODES       0. INVOLVED TNM STAGE:                 T1A N0 MX. AJCC STAGE GROUPING:       IA. FIGO STAGE:                IA.   08/26/2012 Imaging   No CT evidence for intra-abdominal or pelvic metastatic disease. Trace free pelvic fluid, presumably postoperative although the date of surgery is not documented in the electronic medical record.   08/26/2012 - 12/13/2012 Chemotherapy   She received 6 cycles of carbo/taxol    10/29/2012 - 11/27/2012 Radiation Therapy   May 27, June 5,  June 11, June 19, November 27, 2012: Proximal vagina 30 Gy in 5 fractions      01/17/2013 Imaging   1.  No evidence of recurrent or metastatic disease. 2.  No acute abnormality involving the abdomen or pelvis. 3.  Mild diffuse hepatic steatosis. 4.  Very small supraumbilical midline anterior abdominal wall hernia containing fat, unchanged   01/22/2014 Imaging    No evidence for metastatic or recurrent disease. 2. No bowel obstruction.  Normal appendix. 3. Small fat containing hernia, stable in appearance. 4. Status post hysterectomy and bilateral oophorectomy   02/19/2014 Imaging   No pulmonary lesions are identified. The abnormality on the chest x-ray is due to asymmetric left-sided sternoclavicular joint degenerative disease   10/12/2014 Imaging   New mild retroperitoneal lymphadenopathy in the left paraaortic region and proximal left common iliac chain, consistent with metastatic disease. No other sites of metastatic disease identified  within the abdomen or pelvis.       11/27/2014 - 02/11/2015 Chemotherapy   She received 4 cycles of carbo/taxol    03/01/2015 PET scan   Single hypermetabolic small retroperitoneal lymph node along the aorta. 2. No evidence of metastatic disease otherwise in the abdomen or pelvis. No evidence local recurrence. 3. Intensely hypermetabolic enlarged nodule adjacent to the RIGHT lobe of thyroid gland. This presumably represents the biopsied lesion in clinician report which was found to be benign thyroid tissue.   04/05/2015 - 05/13/2015 Radiation Therapy   She received 50.4 gray in 28 fractions with simultaneous integrated boost to 56 gray   06/17/2015 Imaging   No acute process or evidence of metastatic disease in the abdomen or pelvis. Resolution of previously described retroperitoneal adenopathy. 2.  Possible constipation. 3. Atherosclerosis.   06/17/2015 Tumor Marker   Patient's tumor was tested for the following markers: CA125 Results of the tumor marker test revealed 33   08/12/2015 Tumor Marker   Patient's tumor was tested for the following markers: CA125 Results of the tumor marker test revealed 52   08/31/2015 PET scan   Development of right paratracheal hypermetabolic adenopathy, consistent with nodal metastasis. 2. The previously described isolated abdominal retroperitoneal hypermetabolic node has resolved. 3.  Persistent hypermetabolic right thyroid nodule, per report previously biopsied. Correlate with those results. 4.  Possible constipation.   09/09/2015 -  Anti-estrogen oral therapy   She has been receiving alternative treatment between megace and tamoxifen   09/09/2015 Tumor Marker   Patient's tumor was tested for the following markers: CA125 Results of the tumor marker test revealed 37.8   09/20/2015 Procedure   Technically successful ultrasound-guided thyroid aspiration biopsy , dominant right nodule   09/20/2015 Pathology Results   THYROID, RIGHT, FINE NEEDLE ASPIRATION (SPECIMEN 1 OF 1 COLLECTED 09/20/15): FINDINGS CONSISTENT WITH BENIGN THYROID NODULE (BETHESDA CATEGORY II).   10/28/2015 Tumor Marker   Patient's tumor was tested for the following markers: CA125 Results of the tumor marker test revealed 53.2   11/04/2015 Imaging   Stable benign right thyroid nodule   12/16/2015 Tumor Marker   Patient's tumor was tested for the following markers: CA125 Results of the tumor marker test revealed 38.1   03/22/2016 PET scan   Interval progression of hypermetabolic right paratracheal lymph node consistent with metastatic involvement. 2. Stable hypermetabolic right thyroid nodule. Reportedly this has been biopsied in the past.   03/23/2016 Tumor Marker   Patient's tumor was tested for the following markers: CA125 Results of the tumor marker test revealed 62.4   04/13/2016 - 06/01/2016 Radiation Therapy   She received 56 Gy to the chest in 28 fractions    05/09/2016 Imaging   No evidence of left lower extremity deep vein thrombosis. No evidence of a superficial thrombosis of the greater and lesser saphenous veins. Positive for thrombus noted in several varicosities of the calf. No evidence of Baker's cyst on the left.   05/17/2016 Tumor Marker   Patient's tumor was tested for the following markers: CA125 Results of the tumor marker test revealed 9   06/29/2016 Tumor Marker   Patient's  tumor was tested for the following markers: CA125 Results of the tumor marker test revealed 5.9   08/04/2016 Tumor Marker   Patient's tumor was tested for the following markers: CA125 Results of the tumor marker test revealed 6.0   09/12/2016 PET scan   Complete metabolic response to therapy, with resolution of hypermetabolic mediastinal lymphadenopathy since prior exam. No residual  or new metastatic disease identified. Stable hypermetabolic right thyroid lobe nodule, which was previously biopsied on 09/20/2015.   03/13/2017 PET scan   1. Solitary focus of recurrent right paratracheal hypermetabolic activity, with a 1.0 cm right lower paratracheal node having a maximum SUV of 9.0. Appearance compatible with recurrent malignancy. 2. Continued hypermetabolic right thyroid nodule, previously biopsied, presumed benign -correlate with prior biopsy results. 3. Other imaging findings of potential clinical significance: Aortic Atherosclerosis (ICD10-I70.0). Mild cardiomegaly. Prominent stool throughout the colon favors constipation.   04/02/2017 Pathology Results   FINE NEEDLE ASPIRATION, ENDOSCOPIC, (EBUS) 4 R NODE (SPECIMEN 1 OF 2 COLLECTED 04/02/17): MALIGNANT CELLS PRESENT, CONSISTENT WITH CARCINOMA. SEE COMMENT. COMMENT: THE MALIGNANT CELLS ARE POSITIVE FOR P53 AND NEGATIVE FOR ESTROGEN RECEPTOR AND TTF-1. THIS PROFILE IS NON-SPECIFIC, BUT THE P53 POSITIVE STAINING IS SUGGESTIVE OF GYNECOLOGIC PRIMARY.    04/11/2017 Procedure   Successful 8 French right internal jugular vein power port placement with its tip at the SVC/RA junction   04/12/2017 Imaging   Normal LV size with mild LV hypertrophy. EF 55-60%. Normal RV size and systolic function. Aortic valve sclerosis without significant stenosis.   04/18/2017 - 06/14/2017 Chemotherapy   The patient had chemotherapy with Doxil    07/10/2017 Imaging   - Left ventricle: The cavity size was normal. There was mild concentric hypertrophy. Systolic  function was normal. The estimated ejection fraction was in the range of 55% to 60%. Wall motion was normal; there were no regional wall motion abnormalities. Doppler parameters are consistent with abnormal left ventricular relaxation (grade 1 diastolic dysfunction). - Aortic valve: Noncoronary cusp mobility was mildly restricted. - Mitral valve: There was mild regurgitation. - Left atrium: The atrium was mildly dilated. - Atrial septum: No defect or patent foramen ovale was identified.  Impressions:  - Compared to November 2018, global LV longitudinal strain remains normal (has increased)   07/11/2017 PET scan   Increased size and hypermetabolic activity of 3.3 cm right thyroid lobe nodule. Thyroid carcinoma cannot be excluded. Recommend repeat ultrasound guided fine needle aspiration to exclude thyroid carcinoma.  New adjacent hypermetabolic 10 mm right supraclavicular lymph node, suspicious for lymph node metastasis.  Slight increase in size and hypermetabolic activity of solitary right paratracheal lymph node.  No evidence of abdominal or pelvic metastatic disease.   07/18/2017 Procedure   1. Technically successful ultrasound guided fine needle aspiration of indeterminate hypermetabolic right-sided thyroid nodule/mass. 2. Technically successful ultrasound-guided core needle biopsy of hypermetabolic right lower cervical lymph node.   07/18/2017 Pathology Results   THYROID, FINE NEEDLE ASPIRATION RIGHT (SPECIMEN 1 OF 1, COLLECTED ON 07/18/2017): ATYPIA OF UNDETERMINED SIGNIFICANCE OR FOLLICULAR LESION OF UNDETERMINED SIGNIFICANCE (BETHESDA CATEGORY III). SEE COMMENT. COMMENT: THE SPECIMEN CONSISTS OF SMALL AND MEDIUM SIZED GROUPS OF FOLLICULAR EPITHELIAL CELLS WITH MILD CYTOLOGIC ATYPIA INCLUDING NUCLEAR ENLARGEMENT AND HURTHLE CELL CHANGE. SOME GROUPS ARE ARRANGED AS MICROFOLLICLES. THERE IS MINIMAL BACKGROUND COLLOID. BASED ON THESE FEATURES, A FOLLICULAR LESION/NEOPLASM CAN NOT BE ENTIRELY  RULED OUT. A SPECIMEN WILL BE SENT FOR AFIRMA TESTING.   07/19/2017 Pathology Results   Lymph node, needle/core biopsy - METASTATIC PAPILLARY SEROUS CARCINOMA. - SEE COMMENT. Microscopic Comment Dr. Vicente Males has reviewed the case and concurs with this interpretation   10/15/2017 PET scan   No new or progressive disease. No evidence of abdominal or pelvic metastatic disease.  Stable hypermetabolic right thyroid lobe nodule and adjacent right supraclavicular lymph node.  Decreased size and hypermetabolic activity of solitary right paratracheal lymph node.  01/23/2018 PET scan   1. Overall, no significant change from PET-CT of 3 months ago. There is persistent hypermetabolic activity at the right thoracic inlet and within a right paratracheal mediastinal node. The nodes have not significantly changed in size, although the metabolic activity has minimally increased over this interval. It is uncertain how much of the thoracic inlet metabolic activity is attributed to the thyroid nodule versus adjacent cervical lymph nodes, both previously biopsied. 2. No new hypermetabolic activity within the neck, chest, abdomen or pelvis. No evidence of local recurrence in the pelvis. 3. Stable probable radiation changes in the right lung.   04/16/2018 PET scan   1. Interval increase in metabolic activity of the RIGHT supraclavicular node and RIGHT lower paratracheal mediastinal node with minimal change in size. Findings consistent with persistent and mildly progressed metastatic adenopathy. 2. New hypermetabolic small lymph node in the RIGHT lower paratracheal nodal station adjacent to the previously followed node. 3. No evidence of local recurrence in the pelvis or new disease elsewhere.   07/30/2018 PET scan   1. Mild interval progression of right supraclavicular, mediastinal, and right hilar hypermetabolic metastatic disease. There is a new hypermetabolic lymph node in the subcarinal station on today's  study. 2. No hypermetabolic disease in the abdomen or pelvis to suggest recurrent/metastases. 3.  Aortic Atherosclerois (ICD10-170.0)     08/16/2018 -  Chemotherapy   The patient had Lenvima since 3/6 and pembrolizumab since 3/13 for chemotherapy treatment. Michel Santee was held temporarily due to infection and severe hypertension   10/31/2018 PET scan   Partial response to therapy, with decreased hypermetabolic lymphadenopathy in mediastinum and right hilar region.   No significant change in hypermetabolic lymphadenopathy in right inferior neck.   No new or progressive metastatic disease identified. No evidence of recurrent or metastatic carcinoma in the pelvis or abdomen   02/04/2019 PET scan   1. Decrease in metabolic activity of RIGHT supraclavicular node and RIGHT paratracheal lymph nodes. 2. Persistent activity in the RIGHT thyroid nodule unchanged. 3. Diffuse activity through the esophagus is favored esophagitis. No change. 4. Scattered foci of intense metabolic activity associated the bowel without focal lesion on CT favored benign. 5. No evidence of peritoneal metastasis. 6. No evidence of metastatic adenopathy in the abdomen pelvis. 7. No evidence of local pelvic sidewall recurrence.   07/30/2019 PET scan   1. Mildly increased uptake within small nodes along the right sternocleidomastoid without change in size and associated with increased deltoid activity may reflect changes related to right arm injection or recent COVID vaccination, consider clinical correlation and attention on follow-up. 2. Persistent activity in the right thyroid nodule, unchanged from previous exams. 3. New activity in a juxta esophageal lymph node in the chest is suspicious though the node is unchanged with respect to size. Endoscopic correlation could potentially be helpful or close attention on follow-up. 4. Subtle increased FDG uptake along the left mainstem bronchus and adjacent to the aorta may represent a  small lymph node. No discrete correlate is demonstrated on today's study.   10/22/2019 PET scan   1. Persistent FDG avid subcentimeter right supraclavicular and juxta esophageal lymph nodes. The degree of FDG uptake is similar to the previous exam. No new or progressive findings identified. 2. No findings of solid organ metastasis, skeletal metastasis or metastatic disease to the abdomen or pelvis. 3. FDG avid right lobe of thyroid gland nodule is again noted This nodule is not incidental and been worked up previously with 2 biopsies. Please refer  to results from the most recent biopsy dated 07/18/2017. (Ref: J Am Coll Radiol. 2015 Feb;12(2): 143-50). 4.  Aortic Atherosclerosis (ICD10-I70.0).   05/05/2020 PET scan   1. Progressive disease, as evidenced by increased hypermetabolism within right supraclavicular and mediastinal nodes. 2. No infradiaphragmatic hypermetabolic metastasis. 3.  Aortic Atherosclerosis (ICD10-I70.0). 4. Hypermetabolic right thyroid nodule has been detailed on prior exams and was biopsied on 07/18/2017   09/10/2020 PET scan   1. Interval improvement in previously demonstrated hypermetabolic mediastinal and right supraclavicular adenopathy. A single hypermetabolic left paraesophageal lymph node remains, improved from previous study. The other nodes have resolved.  2. No progressive disease. 3. Diffuse esophageal hypermetabolic activity suggesting esophagitis. 4. Hypermetabolic right thyroid nodule has been previously biopsied.   03/18/2021 PET scan   1. No significant change in single residual FDG avid left paraesophageal lymph node. This has an SUV max of 7.03, compared with 6.4 previously. No new sites of disease identified. 2. Diffuse esophageal hypermetabolic activity suggestive of esophagitis. 3. FDG avid right lobe of thyroid gland nodule. This has been biopsied previously. (Ref: J Am Coll Radiol. 2015 Feb;12(2): 143-50).     Metastasis to supraclavicular lymph node  ([place])  07/11/2017 Initial Diagnosis   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[place])   08/16/2018 -  Chemotherapy   The patient had Lenvima since 3/6 and pembrolizumab since 3/13 for chemotherapy treatment. Lenvima was held temporarily due to infection and severe hypertension     PHYSICAL EXAMINATION: ECOG PERFORMANCE STATUS: 1 - Symptomatic but completely ambulatory  Vitals:   07/21/21 1014  BP: (!) 109/58  Pulse: 65  Resp: 18  SpO2: 98%   Filed Weights   07/21/21 1014  Weight: 149 lb 3.2 oz (67.7 kg)    GENERAL:alert, no distress and comfortable SKIN: skin color, texture, turgor are normal, no rashes or significant lesions EYES: normal, Conjunctiva are pink and non-injected, sclera clear OROPHARYNX:no exudate, no erythema and lips, buccal mucosa, and tongue normal  NECK: supple, thyroid normal size, non-tender, without nodularity LYMPH:  no palpable lymphadenopathy in the cervical, axillary or inguinal LUNGS: clear to auscultation and percussion with normal breathing effort HEART: regular rate & rhythm and no murmurs and no lower extremity edema ABDOMEN:abdomen soft, non-tender and normal bowel sounds Musculoskeletal:no cyanosis of digits and no clubbing  NEURO: alert & oriented x 3 with fluent speech, no focal motor/sensory deficits  LABORATORY DATA:  I have reviewed the data as listed    Component Value Date/Time   NA 136 07/21/2021 0949   NA 141 05/17/2017 0957   K 4.1 07/21/2021 0949   K 3.6 05/17/2017 0957   CL 100 07/21/2021 0949   CL 105 11/19/2012 0848   CO2 29 07/21/2021 0949   CO2 25 05/17/2017 0957   GLUCOSE 90 07/21/2021 0949   GLUCOSE 85 05/17/2017 0957   GLUCOSE 122 (H) 11/19/2012 0848   BUN 20 07/21/2021 0949   BUN 15.2 05/17/2017 0957   CREATININE 0.81 07/21/2021 0949   CREATININE 0.84 03/18/2021 1003   CREATININE 0.8 05/17/2017 0957   CALCIUM 9.7 07/21/2021 0949   CALCIUM 9.6 05/17/2017 0957   PROT 7.2 07/21/2021 0949   PROT 7.0 05/17/2017  0957   ALBUMIN 4.2 07/21/2021 0949   ALBUMIN 3.9 05/17/2017 0957   AST 21 07/21/2021 0949   AST 24 03/18/2021 1003   AST 25 05/17/2017 0957   ALT 16 07/21/2021 0949   ALT 21 03/18/2021 1003   ALT 18 05/17/2017 0957   ALKPHOS 44 07/21/2021  0949   ALKPHOS 57 05/17/2017 0957   BILITOT 0.5 07/21/2021 0949   BILITOT 0.6 03/18/2021 1003   BILITOT 0.47 05/17/2017 0957   GFRNONAA >60 07/21/2021 0949   GFRNONAA >60 03/18/2021 1003   GFRAA >60 02/26/2020 0940   GFRAA >60 07/10/2019 1240    No results found for: SPEP, UPEP  Lab Results  Component Value Date   WBC 3.3 (L) 07/21/2021   NEUTROABS 2.1 07/21/2021   HGB 10.7 (L) 07/21/2021   HCT 32.7 (L) 07/21/2021   MCV 95.3 07/21/2021   PLT 165 07/21/2021      Chemistry      Component Value Date/Time   NA 136 07/21/2021 0949   NA 141 05/17/2017 0957   K 4.1 07/21/2021 0949   K 3.6 05/17/2017 0957   CL 100 07/21/2021 0949   CL 105 11/19/2012 0848   CO2 29 07/21/2021 0949   CO2 25 05/17/2017 0957   BUN 20 07/21/2021 0949   BUN 15.2 05/17/2017 0957   CREATININE 0.81 07/21/2021 0949   CREATININE 0.84 03/18/2021 1003   CREATININE 0.8 05/17/2017 0957   GLU 158 (H) 01/14/2015 1035      Component Value Date/Time   CALCIUM 9.7 07/21/2021 0949   CALCIUM 9.6 05/17/2017 0957   ALKPHOS 44 07/21/2021 0949   ALKPHOS 57 05/17/2017 0957   AST 21 07/21/2021 0949   AST 24 03/18/2021 1003   AST 25 05/17/2017 0957   ALT 16 07/21/2021 0949   ALT 21 03/18/2021 1003   ALT 18 05/17/2017 0957   BILITOT 0.5 07/21/2021 0949   BILITOT 0.6 03/18/2021 1003   BILITOT 0.47 05/17/2017 0957

## 2021-07-21 NOTE — Patient Instructions (Signed)
[facility]  Discharge Instructions: Thank you for choosing [facility] to provide your oncology and hematology care.   If you have a lab appointment with the Cancer Center, please go directly to the Cancer Center and check in at the registration area.   Wear comfortable clothing and clothing appropriate for easy access to any Portacath or PICC line.   We strive to give you quality time with your provider. You may need to reschedule your appointment if you arrive late (15 or more minutes).  Arriving late affects you and other patients whose appointments are after yours.  Also, if you miss three or more appointments without notifying the office, you may be dismissed from the clinic at the provider's discretion.      For prescription refill requests, have your pharmacy contact our office and allow 72 hours for refills to be completed.    Today you received the following chemotherapy and/or immunotherapy agents: keytruda      To help prevent nausea and vomiting after your treatment, we encourage you to take your nausea medication as directed.  BELOW ARE SYMPTOMS THAT SHOULD BE REPORTED IMMEDIATELY: *FEVER GREATER THAN 100.4 F (38 C) OR HIGHER *CHILLS OR SWEATING *NAUSEA AND VOMITING THAT IS NOT CONTROLLED WITH YOUR NAUSEA MEDICATION *UNUSUAL SHORTNESS OF BREATH *UNUSUAL BRUISING OR BLEEDING *URINARY PROBLEMS (pain or burning when urinating, or frequent urination) *BOWEL PROBLEMS (unusual diarrhea, constipation, pain near the anus) TENDERNESS IN MOUTH AND THROAT WITH OR WITHOUT PRESENCE OF ULCERS (sore throat, sores in mouth, or a toothache) UNUSUAL RASH, SWELLING OR PAIN  UNUSUAL VAGINAL DISCHARGE OR ITCHING   Items with * indicate a potential emergency and should be followed up as soon as possible or go to the Emergency Department if any problems should occur.  Please show the CHEMOTHERAPY ALERT CARD or IMMUNOTHERAPY ALERT CARD at check-in to  the Emergency Department and triage nurse.  Should you have questions after your visit or need to cancel or reschedule your appointment, please contact [facility]  Dept: [phone]  and follow the prompts.  Office hours are 8:00 a.m. to 4:30 p.m. Monday - Friday. Please note that voicemails left after 4:00 p.m. may not be returned until the following business day.  We are closed weekends and major holidays. You have access to a nurse at all times for urgent questions. Please call the main number to the clinic Dept: [phone] and follow the prompts.   For any non-urgent questions, you may also contact your provider using MyChart. We now offer e-Visits for anyone 18 and older to request care online for non-urgent symptoms. For details visit [email or web address].   Also download the MyChart app! Go to the app store, search "MyChart", open the app, select [facility], and log in with your MyChart username and password.  Due to Covid, a mask is required upon entering the hospital/clinic. If you do not have a mask, one will be given to you upon arrival. For doctor visits, patients may have 1 support person aged 18 or older with them. For treatment visits, patients cannot have anyone with them due to current Covid guidelines and our immunocompromised population.   

## 2021-07-21 NOTE — Assessment & Plan Note (Signed)
She has intermittent elevated TSH I will adjust her thyroid medicine accordingly 

## 2021-07-22 ENCOUNTER — Ambulatory Visit: Payer: 59

## 2021-07-22 ENCOUNTER — Other Ambulatory Visit: Payer: 59

## 2021-07-22 ENCOUNTER — Ambulatory Visit: Payer: 59 | Admitting: Hematology and Oncology

## 2021-07-26 ENCOUNTER — Other Ambulatory Visit (HOSPITAL_COMMUNITY): Payer: Self-pay

## 2021-08-01 ENCOUNTER — Other Ambulatory Visit (HOSPITAL_COMMUNITY): Payer: Self-pay

## 2021-08-02 ENCOUNTER — Other Ambulatory Visit (HOSPITAL_COMMUNITY): Payer: Self-pay

## 2021-08-02 DIAGNOSIS — Z0000 Encounter for general adult medical examination without abnormal findings: Secondary | ICD-10-CM | POA: Diagnosis not present

## 2021-08-02 DIAGNOSIS — E7800 Pure hypercholesterolemia, unspecified: Secondary | ICD-10-CM | POA: Diagnosis not present

## 2021-08-02 DIAGNOSIS — Z23 Encounter for immunization: Secondary | ICD-10-CM | POA: Diagnosis not present

## 2021-08-02 DIAGNOSIS — M859 Disorder of bone density and structure, unspecified: Secondary | ICD-10-CM | POA: Diagnosis not present

## 2021-08-02 DIAGNOSIS — F411 Generalized anxiety disorder: Secondary | ICD-10-CM | POA: Diagnosis not present

## 2021-08-02 DIAGNOSIS — L309 Dermatitis, unspecified: Secondary | ICD-10-CM | POA: Diagnosis not present

## 2021-08-02 DIAGNOSIS — E559 Vitamin D deficiency, unspecified: Secondary | ICD-10-CM | POA: Diagnosis not present

## 2021-08-02 DIAGNOSIS — L738 Other specified follicular disorders: Secondary | ICD-10-CM | POA: Diagnosis not present

## 2021-08-02 DIAGNOSIS — M25561 Pain in right knee: Secondary | ICD-10-CM | POA: Diagnosis not present

## 2021-08-02 DIAGNOSIS — C541 Malignant neoplasm of endometrium: Secondary | ICD-10-CM | POA: Diagnosis not present

## 2021-08-02 DIAGNOSIS — I700 Atherosclerosis of aorta: Secondary | ICD-10-CM | POA: Diagnosis not present

## 2021-08-02 MED ORDER — TACROLIMUS 0.1 % EX OINT
TOPICAL_OINTMENT | Freq: Two times a day (BID) | CUTANEOUS | 11 refills | Status: DC
  Filled 2021-08-02: qty 30, 10d supply, fill #0

## 2021-08-02 MED ORDER — CEPHALEXIN 500 MG PO CAPS
ORAL_CAPSULE | ORAL | 0 refills | Status: DC
  Filled 2021-08-02: qty 30, 10d supply, fill #0

## 2021-08-03 ENCOUNTER — Other Ambulatory Visit (HOSPITAL_COMMUNITY): Payer: Self-pay

## 2021-08-04 ENCOUNTER — Other Ambulatory Visit (HOSPITAL_COMMUNITY): Payer: Self-pay

## 2021-08-04 DIAGNOSIS — M25561 Pain in right knee: Secondary | ICD-10-CM | POA: Diagnosis not present

## 2021-08-12 ENCOUNTER — Inpatient Hospital Stay: Payer: 59

## 2021-08-12 ENCOUNTER — Other Ambulatory Visit: Payer: Self-pay

## 2021-08-12 ENCOUNTER — Inpatient Hospital Stay: Payer: 59 | Attending: Hematology and Oncology

## 2021-08-12 VITALS — BP 131/68 | HR 66 | Temp 98.1°F | Resp 16 | Wt 147.5 lb

## 2021-08-12 DIAGNOSIS — Z5112 Encounter for antineoplastic immunotherapy: Secondary | ICD-10-CM | POA: Insufficient documentation

## 2021-08-12 DIAGNOSIS — E039 Hypothyroidism, unspecified: Secondary | ICD-10-CM | POA: Insufficient documentation

## 2021-08-12 DIAGNOSIS — C541 Malignant neoplasm of endometrium: Secondary | ICD-10-CM | POA: Diagnosis not present

## 2021-08-12 DIAGNOSIS — D539 Nutritional anemia, unspecified: Secondary | ICD-10-CM | POA: Insufficient documentation

## 2021-08-12 DIAGNOSIS — Z95828 Presence of other vascular implants and grafts: Secondary | ICD-10-CM

## 2021-08-12 DIAGNOSIS — Z79899 Other long term (current) drug therapy: Secondary | ICD-10-CM | POA: Insufficient documentation

## 2021-08-12 DIAGNOSIS — C770 Secondary and unspecified malignant neoplasm of lymph nodes of head, face and neck: Secondary | ICD-10-CM

## 2021-08-12 DIAGNOSIS — Z7189 Other specified counseling: Secondary | ICD-10-CM

## 2021-08-12 LAB — CBC WITH DIFFERENTIAL/PLATELET
Abs Immature Granulocytes: 0.01 10*3/uL (ref 0.00–0.07)
Basophils Absolute: 0 10*3/uL (ref 0.0–0.1)
Basophils Relative: 1 %
Eosinophils Absolute: 0.1 10*3/uL (ref 0.0–0.5)
Eosinophils Relative: 3 %
HCT: 32.2 % — ABNORMAL LOW (ref 36.0–46.0)
Hemoglobin: 10.8 g/dL — ABNORMAL LOW (ref 12.0–15.0)
Immature Granulocytes: 0 %
Lymphocytes Relative: 22 %
Lymphs Abs: 1 10*3/uL (ref 0.7–4.0)
MCH: 32 pg (ref 26.0–34.0)
MCHC: 33.5 g/dL (ref 30.0–36.0)
MCV: 95.5 fL (ref 80.0–100.0)
Monocytes Absolute: 0.3 10*3/uL (ref 0.1–1.0)
Monocytes Relative: 6 %
Neutro Abs: 3.2 10*3/uL (ref 1.7–7.7)
Neutrophils Relative %: 68 %
Platelets: 223 10*3/uL (ref 150–400)
RBC: 3.37 MIL/uL — ABNORMAL LOW (ref 3.87–5.11)
RDW: 13.7 % (ref 11.5–15.5)
WBC: 4.7 10*3/uL (ref 4.0–10.5)
nRBC: 0 % (ref 0.0–0.2)

## 2021-08-12 LAB — COMPREHENSIVE METABOLIC PANEL
ALT: 28 U/L (ref 0–44)
AST: 23 U/L (ref 15–41)
Albumin: 3.9 g/dL (ref 3.5–5.0)
Alkaline Phosphatase: 47 U/L (ref 38–126)
Anion gap: 6 (ref 5–15)
BUN: 18 mg/dL (ref 8–23)
CO2: 29 mmol/L (ref 22–32)
Calcium: 9.5 mg/dL (ref 8.9–10.3)
Chloride: 101 mmol/L (ref 98–111)
Creatinine, Ser: 0.77 mg/dL (ref 0.44–1.00)
GFR, Estimated: >60 mL/min (ref >60)
Glucose, Bld: 99 mg/dL (ref 70–99)
Potassium: 4 mmol/L (ref 3.5–5.1)
Sodium: 136 mmol/L (ref 135–145)
Total Bilirubin: 0.5 mg/dL (ref 0.3–1.2)
Total Protein: 6.7 g/dL (ref 6.5–8.1)

## 2021-08-12 LAB — TSH: TSH: 1.97 u[IU]/mL (ref 0.308–3.960)

## 2021-08-12 MED ORDER — SODIUM CHLORIDE 0.9 % IV SOLN: Freq: Once | INTRAVENOUS | Status: AC

## 2021-08-12 MED ORDER — SODIUM CHLORIDE 0.9% FLUSH
10.0000 mL | Freq: Once | INTRAVENOUS | Status: AC
  Administered 2021-08-12: 10 mL

## 2021-08-12 MED ORDER — SODIUM CHLORIDE 0.9 % IV SOLN
200.0000 mg | Freq: Once | INTRAVENOUS | Status: AC
  Administered 2021-08-12: 200 mg via INTRAVENOUS
  Filled 2021-08-12: qty 200

## 2021-08-12 MED ORDER — HEPARIN SOD (PORK) LOCK FLUSH 100 UNIT/ML IV SOLN
500.0000 [IU] | Freq: Once | INTRAVENOUS | Status: AC | PRN
  Administered 2021-08-12: 500 [IU]

## 2021-08-12 MED ORDER — SODIUM CHLORIDE 0.9% FLUSH
10.0000 mL | INTRAVENOUS | Status: DC | PRN
  Administered 2021-08-12: 10 mL

## 2021-08-12 NOTE — Patient Instructions (Signed)
[facility]   ?Discharge Instructions: ?Thank you for choosing [facility] to provide your oncology and hematology care.  ? ?If you have a lab appointment with the Cancer Center, please go directly to the Cancer Center and check in at the registration area. ?  ?Wear comfortable clothing and clothing appropriate for easy access to any Portacath or PICC line.  ? ?We strive to give you quality time with your provider. You may need to reschedule your appointment if you arrive late (15 or more minutes).  Arriving late affects you and other patients whose appointments are after yours.  Also, if you miss three or more appointments without notifying the office, you may be dismissed from the clinic at the provider?s discretion.    ?  ?For prescription refill requests, have your pharmacy contact our office and allow 72 hours for refills to be completed.   ? ?Today you received the following chemotherapy and/or immunotherapy agents: pembrolizumab    ?  ?To help prevent nausea and vomiting after your treatment, we encourage you to take your nausea medication as directed. ? ?BELOW ARE SYMPTOMS THAT SHOULD BE REPORTED IMMEDIATELY: ?*FEVER GREATER THAN 100.4 F (38 ?C) OR HIGHER ?*CHILLS OR SWEATING ?*NAUSEA AND VOMITING THAT IS NOT CONTROLLED WITH YOUR NAUSEA MEDICATION ?*UNUSUAL SHORTNESS OF BREATH ?*UNUSUAL BRUISING OR BLEEDING ?*URINARY PROBLEMS (pain or burning when urinating, or frequent urination) ?*BOWEL PROBLEMS (unusual diarrhea, constipation, pain near the anus) ?TENDERNESS IN MOUTH AND THROAT WITH OR WITHOUT PRESENCE OF ULCERS (sore throat, sores in mouth, or a toothache) ?UNUSUAL RASH, SWELLING OR PAIN  ?UNUSUAL VAGINAL DISCHARGE OR ITCHING  ? ?Items with * indicate a potential emergency and should be followed up as soon as possible or go to the Emergency Department if any problems should occur. ? ?Please show the CHEMOTHERAPY ALERT CARD or IMMUNOTHERAPY ALERT CARD at  check-in to the Emergency Department and triage nurse. ? ?Should you have questions after your visit or need to cancel or reschedule your appointment, please contact [facility]  Dept: [phone]  and follow the prompts.  Office hours are 8:00 a.m. to 4:30 p.m. Monday - Friday. Please note that voicemails left after 4:00 p.m. may not be returned until the following business day.  We are closed weekends and major holidays. You have access to a nurse at all times for urgent questions. Please call the main number to the clinic Dept: [phone] and follow the prompts. ? ? ?For any non-urgent questions, you may also contact your provider using MyChart. We now offer e-Visits for anyone 18 and older to request care online for non-urgent symptoms. For details visit [email or web address]. ?  ?Also download the MyChart app! Go to the app store, search "MyChart", open the app, select , and log in with your MyChart username and password. ? ?Due to Covid, a mask is required upon entering the hospital/clinic. If you do not have a mask, one will be given to you upon arrival. For doctor visits, patients may have 1 support person aged 18 or older with them. For treatment visits, patients cannot have anyone with them due to current Covid guidelines and our immunocompromised population.  ? ?

## 2021-08-19 DIAGNOSIS — R011 Cardiac murmur, unspecified: Secondary | ICD-10-CM | POA: Diagnosis not present

## 2021-08-25 ENCOUNTER — Other Ambulatory Visit (HOSPITAL_COMMUNITY): Payer: Self-pay

## 2021-08-30 ENCOUNTER — Encounter: Payer: Self-pay | Admitting: Hematology and Oncology

## 2021-08-31 ENCOUNTER — Other Ambulatory Visit (HOSPITAL_COMMUNITY): Payer: Self-pay

## 2021-09-01 ENCOUNTER — Inpatient Hospital Stay: Payer: 59

## 2021-09-01 ENCOUNTER — Inpatient Hospital Stay (HOSPITAL_BASED_OUTPATIENT_CLINIC_OR_DEPARTMENT_OTHER): Payer: 59 | Admitting: Hematology and Oncology

## 2021-09-01 ENCOUNTER — Encounter: Payer: Self-pay | Admitting: Hematology and Oncology

## 2021-09-01 ENCOUNTER — Other Ambulatory Visit: Payer: Self-pay

## 2021-09-01 VITALS — BP 109/71 | HR 87 | Temp 97.9°F | Resp 18 | Ht 64.0 in | Wt 150.2 lb

## 2021-09-01 DIAGNOSIS — Z7189 Other specified counseling: Secondary | ICD-10-CM

## 2021-09-01 DIAGNOSIS — D539 Nutritional anemia, unspecified: Secondary | ICD-10-CM | POA: Diagnosis not present

## 2021-09-01 DIAGNOSIS — Z95828 Presence of other vascular implants and grafts: Secondary | ICD-10-CM

## 2021-09-01 DIAGNOSIS — C541 Malignant neoplasm of endometrium: Secondary | ICD-10-CM

## 2021-09-01 DIAGNOSIS — Z79899 Other long term (current) drug therapy: Secondary | ICD-10-CM | POA: Diagnosis not present

## 2021-09-01 DIAGNOSIS — E039 Hypothyroidism, unspecified: Secondary | ICD-10-CM

## 2021-09-01 DIAGNOSIS — Z5112 Encounter for antineoplastic immunotherapy: Secondary | ICD-10-CM | POA: Diagnosis not present

## 2021-09-01 DIAGNOSIS — D61818 Other pancytopenia: Secondary | ICD-10-CM | POA: Diagnosis not present

## 2021-09-01 DIAGNOSIS — I10 Essential (primary) hypertension: Secondary | ICD-10-CM

## 2021-09-01 DIAGNOSIS — C770 Secondary and unspecified malignant neoplasm of lymph nodes of head, face and neck: Secondary | ICD-10-CM

## 2021-09-01 LAB — CBC WITH DIFFERENTIAL/PLATELET
Abs Immature Granulocytes: 0.01 10*3/uL (ref 0.00–0.07)
Basophils Absolute: 0.1 10*3/uL (ref 0.0–0.1)
Basophils Relative: 1 %
Eosinophils Absolute: 0.2 10*3/uL (ref 0.0–0.5)
Eosinophils Relative: 5 %
HCT: 32.9 % — ABNORMAL LOW (ref 36.0–46.0)
Hemoglobin: 10.7 g/dL — ABNORMAL LOW (ref 12.0–15.0)
Immature Granulocytes: 0 %
Lymphocytes Relative: 17 %
Lymphs Abs: 0.8 10*3/uL (ref 0.7–4.0)
MCH: 31.4 pg (ref 26.0–34.0)
MCHC: 32.5 g/dL (ref 30.0–36.0)
MCV: 96.5 fL (ref 80.0–100.0)
Monocytes Absolute: 0.4 10*3/uL (ref 0.1–1.0)
Monocytes Relative: 8 %
Neutro Abs: 3.3 10*3/uL (ref 1.7–7.7)
Neutrophils Relative %: 69 %
Platelets: 203 10*3/uL (ref 150–400)
RBC: 3.41 MIL/uL — ABNORMAL LOW (ref 3.87–5.11)
RDW: 13.7 % (ref 11.5–15.5)
WBC: 4.8 10*3/uL (ref 4.0–10.5)
nRBC: 0 % (ref 0.0–0.2)

## 2021-09-01 LAB — COMPREHENSIVE METABOLIC PANEL
ALT: 17 U/L (ref 0–44)
AST: 21 U/L (ref 15–41)
Albumin: 3.8 g/dL (ref 3.5–5.0)
Alkaline Phosphatase: 50 U/L (ref 38–126)
Anion gap: 7 (ref 5–15)
BUN: 18 mg/dL (ref 8–23)
CO2: 28 mmol/L (ref 22–32)
Calcium: 9.4 mg/dL (ref 8.9–10.3)
Chloride: 101 mmol/L (ref 98–111)
Creatinine, Ser: 0.71 mg/dL (ref 0.44–1.00)
GFR, Estimated: >60 mL/min (ref >60)
Glucose, Bld: 91 mg/dL (ref 70–99)
Potassium: 4.2 mmol/L (ref 3.5–5.1)
Sodium: 136 mmol/L (ref 135–145)
Total Bilirubin: 0.4 mg/dL (ref 0.3–1.2)
Total Protein: 6.8 g/dL (ref 6.5–8.1)

## 2021-09-01 LAB — TSH: TSH: 1.8 u[IU]/mL (ref 0.308–3.960)

## 2021-09-01 MED ORDER — SODIUM CHLORIDE 0.9 % IV SOLN: Freq: Once | INTRAVENOUS | Status: AC

## 2021-09-01 MED ORDER — SODIUM CHLORIDE 0.9% FLUSH
10.0000 mL | INTRAVENOUS | Status: DC | PRN
  Administered 2021-09-01: 10 mL

## 2021-09-01 MED ORDER — SODIUM CHLORIDE 0.9 % IV SOLN
200.0000 mg | Freq: Once | INTRAVENOUS | Status: AC
  Administered 2021-09-01: 200 mg via INTRAVENOUS
  Filled 2021-09-01: qty 200

## 2021-09-01 MED ORDER — HEPARIN SOD (PORK) LOCK FLUSH 100 UNIT/ML IV SOLN
500.0000 [IU] | Freq: Once | INTRAVENOUS | Status: AC | PRN
  Administered 2021-09-01: 500 [IU]

## 2021-09-01 MED ORDER — SODIUM CHLORIDE 0.9% FLUSH
10.0000 mL | Freq: Once | INTRAVENOUS | Status: AC
  Administered 2021-09-01: 10 mL

## 2021-09-01 NOTE — Progress Notes (Signed)
[facility] ?OFFICE PROGRESS NOTE ? ?Patient Care Team: ?[name], MD as PCP - General (Family Medicine) ?Reynold Bowen, MD as Consulting Physician (Endocrinology) ? ?ASSESSMENT & PLAN:  ?Endometrial cancer ([place]) ?Her last PET CT imaging showed no signs of cancer progression ?We will continue current treatment with pembrolizumab/Lenvima indefinitely ?I will space out her next imaging study to 6 months, due in April 2023 ? ?Essential hypertension ?Her blood pressure is stable ?She will continue Lenvima and her current blood pressure treatment ? ?Acquired hypothyroidism ?She has intermittent elevated TSH ?I will adjust her thyroid medicine accordingly ? ?Pancytopenia, acquired ([place]) ?This is due to side effects of treatment ?She is not symptomatic ?Observe only ? ?Orders Placed This Encounter  ?Procedures  ? NM PET Image Restage (PS) Skull Base to Thigh (F-18 FDG)  ?  Standing Status:   Future  ?  Standing Expiration Date:   09/02/2022  ?  Order Specific Question:   If indicated for the ordered procedure, I authorize the administration of a radiopharmaceutical per Radiology protocol  ?  Answer:   Yes  ?  Order Specific Question:   Preferred imaging location?  ?  Answer:   [facility]  ?  Order Specific Question:   Radiology Contrast Protocol - do NOT remove file path  ?  Answer:   [email or web address]  ? ? ?All questions were answered. The patient knows to call the clinic with any problems, questions or concerns. ?The total time spent in the appointment was 30 minutes encounter with patients including review of chart and various tests results, discussions about plan of care and coordination of care plan ?  ?[name], MD ?09/01/2021 12:55 PM ? ?INTERVAL HISTORY: ?Please see below for problem oriented charting. ?she returns for treatment follow-up on pembrolizumab and Lenvima ?She tolerated recent treatment well ?She was placed on antibiotics treatment for  week by her dermatologist for folliculitis ?Her symptoms has resolved ?She denies elevated blood pressure with monitoring at home ?No new side effects from treatment so far ? ?REVIEW OF SYSTEMS:   ?Constitutional: Denies fevers, chills or abnormal weight loss ?Eyes: Denies blurriness of vision ?Ears, nose, mouth, throat, and face: Denies mucositis or sore throat ?Respiratory: Denies cough, dyspnea or wheezes ?Cardiovascular: Denies palpitation, chest discomfort or lower extremity swelling ?Gastrointestinal:  Denies nausea, heartburn or change in bowel habits ?Skin: Denies abnormal skin rashes ?Lymphatics: Denies new lymphadenopathy or easy bruising ?Neurological:Denies numbness, tingling or new weaknesses ?Behavioral/Psych: Mood is stable, no new changes  ?All other systems were reviewed with the patient and are negative. ? ?I have reviewed the past medical history, past surgical history, social history and family history with the patient and they are unchanged from previous note. ? ?ALLERGIES:  is allergic to lenvima [lenvatinib]. ? ?MEDICATIONS:  ?Current Outpatient Medications  ?Medication Sig Dispense Refill  ? amLODipine (NORVASC) 10 MG tablet TAKE 1 TABLET BY MOUTH ONCE DAILY. **DECREASE SIMVASTATIN TO 20 MG DAILY WHILE ON AMLODIPINE PER MD** 90 tablet 1  ? busPIRone (BUSPAR) 10 MG tablet Take 1 tablet by mouth 2 times a day 180 tablet 3  ? Calcium Carb-Cholecalciferol 500-600 MG-UNIT TABS Take 1 tablet by mouth daily.     ? Cholecalciferol (VITAMIN D3) 2000 units TABS Take 2,000 Units by mouth daily.     ? cyanocobalamin (,VITAMIN B-12,) 1000 MCG/ML injection Inject 1 mL (1,000 mcg total) into the muscle every 30 (thirty) days. 1 mL 11  ? hydrochlorothiazide (MICROZIDE) 12.5 MG capsule Take 1  capsule (12.5 mg total) by mouth daily. 90 capsule 3  ? lenvatinib 10 mg daily dose (LENVIMA, 10 MG DAILY DOSE,) capsule TAKE 1 CAPSULE (10 MG TOTAL) BY MOUTH DAILY. 30 each 11  ? levothyroxine (SYNTHROID) 112 MCG tablet  Take 1 tablet by mouth daily on an empty stomach 30 mins before a meal 90 tablet 3  ? lidocaine-prilocaine (EMLA) cream APPLY TO AFFECTED AREA ONCE AS DIRECTED 30 g 3  ? LORazepam (ATIVAN) 0.5 MG tablet Take 0.5 mg by mouth 2 (two) times daily as needed.  0  ? losartan (COZAAR) 25 MG tablet TAKE 1 TABLET BY MOUTH DAILY 90 tablet 3  ? metoprolol tartrate (LOPRESSOR) 25 MG tablet TAKE 1 TABLET BY MOUTH TWICE DAILY 180 tablet 3  ? ondansetron (ZOFRAN) 8 MG tablet Take 1 tablet (8 mg total) by mouth every 8 (eight) hours as needed for nausea. 30 tablet 3  ? pantoprazole (PROTONIX) 40 MG tablet TAKE 1 TABLET BY MOUTH ONCE DAILY 90 tablet 3  ? simvastatin (ZOCOR) 20 MG tablet Take 1 tablet (20 mg total) by mouth daily in the evening. 90 tablet 1  ? tacrolimus (PROTOPIC) 0.1 % ointment Apply 1 application topically daily as needed (rash).     ? tacrolimus (PROTOPIC) 0.1 % ointment Apply one application topically twice daily. 30 g 1  ? tacrolimus (PROTOPIC) 0.1 % ointment Apply to affected area on the skin twice a day 30 g 11  ? venlafaxine XR (EFFEXOR XR) 150 MG 24 hr capsule Take 2 capsules by mouth in the morning with food 180 capsule 3  ? ?No current facility-administered medications for this visit.  ? ?Facility-Administered Medications Ordered in Other Visits  ?Medication Dose Route Frequency Provider Last Rate Last Admin  ? sodium chloride flush (NS) 0.9 % injection 10 mL  10 mL Intracatheter PRN Alvy Bimler, Dailah Opperman, MD   10 mL at 09/01/21 1134  ? ? ?SUMMARY OF ONCOLOGIC HISTORY: ?Oncology History Overview Note  ?[employer] testing done 11-2015 on surgical path from 2014: ?   MS stable ?  TMB low  4 muts/mb ?  ATM [phone] ?  ERBB3 T389K ?  E2H2 rearrangement exon 9 ?  PPP2R1A P179R ?  TP53 I195N  ? ? ?ER- APPROXIMATELY 25-35% STAINING IN NEOPLASTIC CELLS (INTERMEDIATE)  ?PR- APPROXIMATELY 25-35% STAINING IN NEOPLASTIC CELLS (STRONG) ? ?Repeat biopsy 04/02/17: ?ER negative, Her 2 negative ? ?Progressed on Doxil ?   ?Endometrial cancer [facility])  ?06/20/2012 Pathology Results  ? Biopsy positive for papillary serous carcinoma ?  ?06/20/2012 Genetic Testing  ? [employer] testing done 11-2015 on surgical path from 2014: ?   MS stable ?  TMB low  4 muts/mb ?  ATM [phone] ?  ERBB3 T389K ?  E2H2 rearrangement exon 9 ?  PPP2R1A P179R ?  TP53 I195N  ?  ?06/20/2012 Initial Diagnosis  ? Patient presented to PCP with intermittent vaginal bleeding since ~ Oct 2013, endometrial biopsy 06-20-12 with complex endometrial hyperplasia with atypia ?  ?06/26/2012 Imaging  ? Thickened endometrial lining in a postmenopausal patient experiencing vaginal bleeding. In the setting of post-menopausal bleeding, endometrial sampling is indicated to exclude carcinoma. No focal myometrial abnormalities are seen.  Normal left ovary and non-visualized right ovary ?  ?07/30/2012 Surgery  ? Dr. Polly Cobia performed robotic hysterectomy with bilateral salpingo-oophorectomy, bilateral pelvic lymph node dissection and periaortic lymph node dissection. ?Intraoperatively on frozen section, the patient was noted to have a large endometrial polyp with changes within the polyp consistent  for high-grade malignancy, possibly papillary serous carcinoma. There is no obvious ?extrauterine disease noted. ? ? ?  ?07/30/2012 Pathology Results  ? [id number] ?SUPPLEMENTAL REPORT  ?THE ENDOMETRIAL CARCINOMA WAS ANALYZED FOR DNA MISMATCH REPAIR PROTEINS.  IMMUNOHISTOCHEMICALLY, THE NEOPLASM RETAINED NUCLEAR EXPRESSION OF 4 GENE PRODUCTS, MLH1, MSH2, MSH6, AND PMS2, INVOLVED IN DNA MISMATCH REPAIR.  POSITIVE AND NEGATIVE CONTROLS WORKED APPROPRIATELY.  ?PER REQUEST, AN ER AND PR ARE PERFORMED ON BLOCK 1I.  ?ER- APPROXIMATELY 25-35% STAINING IN NEOPLASTIC CELLS (INTERMEDIATE)  ?PR- APPROXIMATELY 25-35% STAINING IN NEOPLASTIC CELLS (STRONG)  ?ER AND PR PREDOMINANTLY SHOW STAINING IN THE SEROUS COMPONENT.  ?DIAGNOSIS  ?1. UTERUS, CERVIX WITH BILATERAL FALLOPIAN TUBES AND OVARIES,   ?HYSTERECTOMY AND BILATERAL SALPINGO-OOPHORECTOMY:  ?HIGH GRADE/POORLY DIFFERENTIATED ENDOMETRIAL ADENOCARCINOMA WITH SOLID AND SEROUS PAPILLARY COMPONENTS.  ?HISTOPATHOLOGIC TYPE:    A VARIETY OF PATTERNS WERE PRESENT, A

## 2021-09-01 NOTE — Patient Instructions (Signed)
[facility]   ?Discharge Instructions: ?Thank you for choosing [facility] to provide your oncology and hematology care.  ? ?If you have a lab appointment with the Cancer Center, please go directly to the Cancer Center and check in at the registration area. ?  ?Wear comfortable clothing and clothing appropriate for easy access to any Portacath or PICC line.  ? ?We strive to give you quality time with your provider. You may need to reschedule your appointment if you arrive late (15 or more minutes).  Arriving late affects you and other patients whose appointments are after yours.  Also, if you miss three or more appointments without notifying the office, you may be dismissed from the clinic at the provider?s discretion.    ?  ?For prescription refill requests, have your pharmacy contact our office and allow 72 hours for refills to be completed.   ? ?Today you received the following chemotherapy and/or immunotherapy agents: pembrolizumab    ?  ?To help prevent nausea and vomiting after your treatment, we encourage you to take your nausea medication as directed. ? ?BELOW ARE SYMPTOMS THAT SHOULD BE REPORTED IMMEDIATELY: ?*FEVER GREATER THAN 100.4 F (38 ?C) OR HIGHER ?*CHILLS OR SWEATING ?*NAUSEA AND VOMITING THAT IS NOT CONTROLLED WITH YOUR NAUSEA MEDICATION ?*UNUSUAL SHORTNESS OF BREATH ?*UNUSUAL BRUISING OR BLEEDING ?*URINARY PROBLEMS (pain or burning when urinating, or frequent urination) ?*BOWEL PROBLEMS (unusual diarrhea, constipation, pain near the anus) ?TENDERNESS IN MOUTH AND THROAT WITH OR WITHOUT PRESENCE OF ULCERS (sore throat, sores in mouth, or a toothache) ?UNUSUAL RASH, SWELLING OR PAIN  ?UNUSUAL VAGINAL DISCHARGE OR ITCHING  ? ?Items with * indicate a potential emergency and should be followed up as soon as possible or go to the Emergency Department if any problems should occur. ? ?Please show the CHEMOTHERAPY ALERT CARD or IMMUNOTHERAPY ALERT CARD at  check-in to the Emergency Department and triage nurse. ? ?Should you have questions after your visit or need to cancel or reschedule your appointment, please contact [facility]  Dept: [phone]  and follow the prompts.  Office hours are 8:00 a.m. to 4:30 p.m. Monday - Friday. Please note that voicemails left after 4:00 p.m. may not be returned until the following business day.  We are closed weekends and major holidays. You have access to a nurse at all times for urgent questions. Please call the main number to the clinic Dept: [phone] and follow the prompts. ? ? ?For any non-urgent questions, you may also contact your provider using MyChart. We now offer e-Visits for anyone 18 and older to request care online for non-urgent symptoms. For details visit [email or web address]. ?  ?Also download the MyChart app! Go to the app store, search "MyChart", open the app, select [facility], and log in with your MyChart username and password. ? ?Due to Covid, a mask is required upon entering the hospital/clinic. If you do not have a mask, one will be given to you upon arrival. For doctor visits, patients may have 1 support person aged 18 or older with them. For treatment visits, patients cannot have anyone with them due to current Covid guidelines and our immunocompromised population.  ? ?

## 2021-09-01 NOTE — Assessment & Plan Note (Signed)
Her blood pressure is stable She will continue Lenvima and her current blood pressure treatment 

## 2021-09-01 NOTE — Assessment & Plan Note (Signed)
This is due to side effects of treatment She is not symptomatic Observe only 

## 2021-09-01 NOTE — Assessment & Plan Note (Signed)
Her last PET CT imaging showed no signs of cancer progression ?We will continue current treatment with pembrolizumab/Lenvima indefinitely ?I will space out her next imaging study to 6 months, due in April 2023 ?

## 2021-09-01 NOTE — Assessment & Plan Note (Signed)
She has intermittent elevated TSH I will adjust her thyroid medicine accordingly 

## 2021-09-06 ENCOUNTER — Other Ambulatory Visit (HOSPITAL_COMMUNITY): Payer: Self-pay

## 2021-09-19 ENCOUNTER — Other Ambulatory Visit (HOSPITAL_COMMUNITY): Payer: Self-pay

## 2021-09-19 ENCOUNTER — Other Ambulatory Visit: Payer: Self-pay | Admitting: Hematology and Oncology

## 2021-09-19 MED ORDER — AMLODIPINE BESYLATE 10 MG PO TABS
ORAL_TABLET | ORAL | 1 refills | Status: DC
  Filled 2021-09-19: qty 90, 90d supply, fill #0
  Filled 2021-12-14: qty 90, 90d supply, fill #1

## 2021-09-21 ENCOUNTER — Ambulatory Visit (HOSPITAL_COMMUNITY)
Admission: RE | Admit: 2021-09-21 | Discharge: 2021-09-21 | Disposition: A | Discharge status: Home / Self Care | Payer: 59 | Source: Ambulatory Visit | Attending: Hematology and Oncology | Admitting: Hematology and Oncology

## 2021-09-21 DIAGNOSIS — C541 Malignant neoplasm of endometrium: Secondary | ICD-10-CM | POA: Diagnosis not present

## 2021-09-21 DIAGNOSIS — E041 Nontoxic single thyroid nodule: Secondary | ICD-10-CM | POA: Diagnosis not present

## 2021-09-21 DIAGNOSIS — I700 Atherosclerosis of aorta: Secondary | ICD-10-CM | POA: Diagnosis not present

## 2021-09-21 DIAGNOSIS — Z8541 Personal history of malignant neoplasm of cervix uteri: Secondary | ICD-10-CM | POA: Diagnosis not present

## 2021-09-21 DIAGNOSIS — C55 Malignant neoplasm of uterus, part unspecified: Secondary | ICD-10-CM | POA: Diagnosis not present

## 2021-09-21 LAB — GLUCOSE, CAPILLARY: Glucose-Capillary: 85 mg/dL (ref 70–99)

## 2021-09-21 MED ORDER — FLUDEOXYGLUCOSE F - 18 (FDG) INJECTION
7.5000 | Freq: Once | INTRAVENOUS | Status: AC | PRN
Start: 2021-09-21 — End: 2021-09-21
  Administered 2021-09-21: 7.5 via INTRAVENOUS

## 2021-09-23 ENCOUNTER — Inpatient Hospital Stay: Payer: 59 | Attending: Hematology and Oncology

## 2021-09-23 ENCOUNTER — Telehealth: Payer: Self-pay

## 2021-09-23 ENCOUNTER — Other Ambulatory Visit: Payer: Self-pay

## 2021-09-23 ENCOUNTER — Other Ambulatory Visit (HOSPITAL_COMMUNITY): Payer: Self-pay

## 2021-09-23 ENCOUNTER — Inpatient Hospital Stay (HOSPITAL_BASED_OUTPATIENT_CLINIC_OR_DEPARTMENT_OTHER): Payer: 59 | Admitting: Hematology and Oncology

## 2021-09-23 ENCOUNTER — Encounter: Payer: Self-pay | Admitting: Hematology and Oncology

## 2021-09-23 ENCOUNTER — Inpatient Hospital Stay: Payer: 59

## 2021-09-23 VITALS — BP 129/71 | HR 73 | Temp 98.0°F | Resp 16 | Wt 149.2 lb

## 2021-09-23 DIAGNOSIS — Z95828 Presence of other vascular implants and grafts: Secondary | ICD-10-CM

## 2021-09-23 DIAGNOSIS — D61818 Other pancytopenia: Secondary | ICD-10-CM | POA: Diagnosis not present

## 2021-09-23 DIAGNOSIS — C770 Secondary and unspecified malignant neoplasm of lymph nodes of head, face and neck: Secondary | ICD-10-CM | POA: Insufficient documentation

## 2021-09-23 DIAGNOSIS — E538 Deficiency of other specified B group vitamins: Secondary | ICD-10-CM | POA: Diagnosis not present

## 2021-09-23 DIAGNOSIS — E559 Vitamin D deficiency, unspecified: Secondary | ICD-10-CM

## 2021-09-23 DIAGNOSIS — C541 Malignant neoplasm of endometrium: Secondary | ICD-10-CM

## 2021-09-23 DIAGNOSIS — E039 Hypothyroidism, unspecified: Secondary | ICD-10-CM | POA: Diagnosis not present

## 2021-09-23 DIAGNOSIS — Z5112 Encounter for antineoplastic immunotherapy: Secondary | ICD-10-CM | POA: Insufficient documentation

## 2021-09-23 DIAGNOSIS — I10 Essential (primary) hypertension: Secondary | ICD-10-CM | POA: Diagnosis not present

## 2021-09-23 DIAGNOSIS — Z79899 Other long term (current) drug therapy: Secondary | ICD-10-CM | POA: Diagnosis not present

## 2021-09-23 DIAGNOSIS — Z7189 Other specified counseling: Secondary | ICD-10-CM

## 2021-09-23 LAB — CBC WITH DIFFERENTIAL/PLATELET
Abs Immature Granulocytes: 0 10*3/uL (ref 0.00–0.07)
Basophils Absolute: 0 10*3/uL (ref 0.0–0.1)
Basophils Relative: 1 %
Eosinophils Absolute: 0.3 10*3/uL (ref 0.0–0.5)
Eosinophils Relative: 8 %
HCT: 32 % — ABNORMAL LOW (ref 36.0–46.0)
Hemoglobin: 10.5 g/dL — ABNORMAL LOW (ref 12.0–15.0)
Immature Granulocytes: 0 %
Lymphocytes Relative: 20 %
Lymphs Abs: 0.7 10*3/uL (ref 0.7–4.0)
MCH: 31.1 pg (ref 26.0–34.0)
MCHC: 32.8 g/dL (ref 30.0–36.0)
MCV: 94.7 fL (ref 80.0–100.0)
Monocytes Absolute: 0.3 10*3/uL (ref 0.1–1.0)
Monocytes Relative: 8 %
Neutro Abs: 2.2 10*3/uL (ref 1.7–7.7)
Neutrophils Relative %: 63 %
Platelets: 180 10*3/uL (ref 150–400)
RBC: 3.38 MIL/uL — ABNORMAL LOW (ref 3.87–5.11)
RDW: 13.3 % (ref 11.5–15.5)
WBC: 3.5 10*3/uL — ABNORMAL LOW (ref 4.0–10.5)
nRBC: 0 % (ref 0.0–0.2)

## 2021-09-23 LAB — COMPREHENSIVE METABOLIC PANEL
ALT: 16 U/L (ref 0–44)
AST: 21 U/L (ref 15–41)
Albumin: 3.9 g/dL (ref 3.5–5.0)
Alkaline Phosphatase: 47 U/L (ref 38–126)
Anion gap: 9 (ref 5–15)
BUN: 16 mg/dL (ref 8–23)
CO2: 29 mmol/L (ref 22–32)
Calcium: 9.5 mg/dL (ref 8.9–10.3)
Chloride: 99 mmol/L (ref 98–111)
Creatinine, Ser: 0.84 mg/dL (ref 0.44–1.00)
GFR, Estimated: >60 mL/min (ref >60)
Glucose, Bld: 110 mg/dL — ABNORMAL HIGH (ref 70–99)
Potassium: 3.8 mmol/L (ref 3.5–5.1)
Sodium: 137 mmol/L (ref 135–145)
Total Bilirubin: 0.4 mg/dL (ref 0.3–1.2)
Total Protein: 7 g/dL (ref 6.5–8.1)

## 2021-09-23 LAB — TOTAL PROTEIN, URINE DIPSTICK: Protein, ur: <30 mg/dL — AB

## 2021-09-23 LAB — VITAMIN B12: Vitamin B-12: 202 pg/mL (ref 180–914)

## 2021-09-23 LAB — TSH: TSH: 2.547 u[IU]/mL (ref 0.350–4.500)

## 2021-09-23 MED ORDER — SODIUM CHLORIDE 0.9 % IV SOLN: Freq: Once | INTRAVENOUS | Status: AC

## 2021-09-23 MED ORDER — HEPARIN SOD (PORK) LOCK FLUSH 100 UNIT/ML IV SOLN
500.0000 [IU] | Freq: Once | INTRAVENOUS | Status: AC | PRN
  Administered 2021-09-23: 500 [IU]

## 2021-09-23 MED ORDER — LOSARTAN POTASSIUM 25 MG PO TABS
ORAL_TABLET | Freq: Every day | ORAL | 3 refills | Status: DC
  Filled 2021-09-23: qty 90, 90d supply, fill #0
  Filled 2022-01-13: qty 90, 90d supply, fill #1
  Filled 2022-04-24: qty 90, 90d supply, fill #2
  Filled 2022-07-18: qty 90, 90d supply, fill #3

## 2021-09-23 MED ORDER — SODIUM CHLORIDE 0.9 % IV SOLN
200.0000 mg | Freq: Once | INTRAVENOUS | Status: AC
  Administered 2021-09-23: 200 mg via INTRAVENOUS
  Filled 2021-09-23: qty 200

## 2021-09-23 MED ORDER — SODIUM CHLORIDE 0.9% FLUSH
10.0000 mL | Freq: Once | INTRAVENOUS | Status: AC
  Administered 2021-09-23: 10 mL

## 2021-09-23 MED ORDER — SODIUM CHLORIDE 0.9% FLUSH
10.0000 mL | INTRAVENOUS | Status: DC | PRN
  Administered 2021-09-23: 10 mL

## 2021-09-23 NOTE — Assessment & Plan Note (Signed)
Her blood pressure is stable She will continue Lenvima and her current blood pressure treatment 

## 2021-09-23 NOTE — Patient Instructions (Signed)
[facility]  Discharge Instructions: ?Thank you for choosing [facility] to provide your oncology and hematology care.  ? ?If you have a lab appointment with the Cancer Center, please go directly to the Cancer Center and check in at the registration area. ?  ?Wear comfortable clothing and clothing appropriate for easy access to any Portacath or PICC line.  ? ?We strive to give you quality time with your provider. You may need to reschedule your appointment if you arrive late (15 or more minutes).  Arriving late affects you and other patients whose appointments are after yours.  Also, if you miss three or more appointments without notifying the office, you may be dismissed from the clinic at the provider?s discretion.    ?  ?For prescription refill requests, have your pharmacy contact our office and allow 72 hours for refills to be completed.   ? ?Today you received the following chemotherapy and/or immunotherapy agents: Keytruda ?  ?To help prevent nausea and vomiting after your treatment, we encourage you to take your nausea medication as directed. ? ?BELOW ARE SYMPTOMS THAT SHOULD BE REPORTED IMMEDIATELY: ?*FEVER GREATER THAN 100.4 F (38 ?C) OR HIGHER ?*CHILLS OR SWEATING ?*NAUSEA AND VOMITING THAT IS NOT CONTROLLED WITH YOUR NAUSEA MEDICATION ?*UNUSUAL SHORTNESS OF BREATH ?*UNUSUAL BRUISING OR BLEEDING ?*URINARY PROBLEMS (pain or burning when urinating, or frequent urination) ?*BOWEL PROBLEMS (unusual diarrhea, constipation, pain near the anus) ?TENDERNESS IN MOUTH AND THROAT WITH OR WITHOUT PRESENCE OF ULCERS (sore throat, sores in mouth, or a toothache) ?UNUSUAL RASH, SWELLING OR PAIN  ?UNUSUAL VAGINAL DISCHARGE OR ITCHING  ? ?Items with * indicate a potential emergency and should be followed up as soon as possible or go to the Emergency Department if any problems should occur. ? ?Please show the CHEMOTHERAPY ALERT CARD or IMMUNOTHERAPY ALERT CARD at check-in to the  Emergency Department and triage nurse. ? ?Should you have questions after your visit or need to cancel or reschedule your appointment, please contact [facility]  Dept: [phone]  and follow the prompts.  Office hours are 8:00 a.m. to 4:30 p.m. Monday - Friday. Please note that voicemails left after 4:00 p.m. may not be returned until the following business day.  We are closed weekends and major holidays. You have access to a nurse at all times for urgent questions. Please call the main number to the clinic Dept: [phone] and follow the prompts. ? ? ?For any non-urgent questions, you may also contact your provider using MyChart. We now offer e-Visits for anyone 18 and older to request care online for non-urgent symptoms. For details visit [email or web address]. ?  ?Also download the MyChart app! Go to the app store, search "MyChart", open the app, select [place], and log in with your MyChart username and password. ? ?Due to Covid, a mask is required upon entering the hospital/clinic. If you do not have a mask, one will be given to you upon arrival. For doctor visits, patients may have 1 support person aged 18 or older with them. For treatment visits, patients cannot have anyone with them due to current Covid guidelines and our immunocompromised population.  ? ?

## 2021-09-23 NOTE — Assessment & Plan Note (Signed)
She has mild intermittent pancytopenia ?She had history of B12 deficiency ?She has been giving herself B12 supplement injections ?I plan to recheck vitamin B12 level again today ?

## 2021-09-23 NOTE — Assessment & Plan Note (Signed)
I have reviewed multiple imaging studies with the patient ?She have excellent response to treatment ?PET CT scan is considered to be normal ?She will continue maintenance pembrolizumab with Lenvima indefinitely ?I plan to repeat imaging study again at the end of the year ?I will see her every other infusion treatment, every 6 weeks ?

## 2021-09-23 NOTE — Progress Notes (Signed)
[place] ?OFFICE PROGRESS NOTE ? ?Patient Care Team: ?Diane Lass, MD as PCP - General (Family Medicine) ?Diane Bowen, MD as Consulting Physician (Endocrinology) ? ?ASSESSMENT & PLAN:  ?Endometrial cancer ([place]) ?I have reviewed multiple imaging studies with the patient ?She have excellent response to treatment ?PET CT scan is considered to be normal ?She will continue maintenance pembrolizumab with Lenvima indefinitely ?I plan to repeat imaging study again at the end of the year ?I will see her every other infusion treatment, every 6 weeks ? ?Acquired hypothyroidism ?She has intermittent elevated TSH ?I will adjust her thyroid medicine accordingly ? ?Pancytopenia, acquired ([place]) ?She has mild intermittent pancytopenia ?She had history of B12 deficiency ?She has been giving herself B12 supplement injections ?I plan to recheck vitamin B12 level again today ? ?Essential hypertension ?Her blood pressure is stable ?She will continue Lenvima and her current blood pressure treatment ? ?Orders Placed This Encounter  ?Procedures  ? Vitamin B12  ?  Standing Status:   Future  ?  Number of Occurrences:   1  ?  Standing Expiration Date:   09/24/2022  ? VITAMIN D 25 Hydroxy (Vit-D Deficiency, Fractures)  ?  Standing Status:   Future  ?  Standing Expiration Date:   09/24/2022  ? CBC with Differential/Platelet  ?  Standing Status:   Standing  ?  Number of Occurrences:   38  ?  Standing Expiration Date:   09/24/2022  ? ? ?All questions were answered. The patient knows to call the clinic with any problems, questions or concerns. ?The total time spent in the appointment was 30 minutes encounter with patients including review of chart and various tests results, discussions about plan of care and coordination of care plan ?  ?Diane Lark, MD ?09/23/2021 10:28 AM ? ?INTERVAL HISTORY: ?Please see below for problem oriented charting. ?she returns for treatment follow-up on pembrolizumab and Lenvima for recurrent metastatic  uterine cancer ?She is doing well ?Her blood pressure is well controlled ?She denies significant reflux symptoms although PET/CT imaging show esophagitis ?No recent infection, fever or chills ?Denies complications from treatment ? ?REVIEW OF SYSTEMS:   ?Constitutional: Denies fevers, chills or abnormal weight loss ?Eyes: Denies blurriness of vision ?Ears, nose, mouth, throat, and face: Denies mucositis or sore throat ?Respiratory: Denies cough, dyspnea or wheezes ?Cardiovascular: Denies palpitation, chest discomfort or lower extremity swelling ?Gastrointestinal:  Denies nausea, heartburn or change in bowel habits ?Skin: Denies abnormal skin rashes ?Lymphatics: Denies new lymphadenopathy or easy bruising ?Neurological:Denies numbness, tingling or new weaknesses ?Behavioral/Psych: Mood is stable, no new changes  ?All other systems were reviewed with the patient and are negative. ? ?I have reviewed the past medical history, past surgical history, social history and family history with the patient and they are unchanged from previous note. ? ?ALLERGIES:  is allergic to lenvima [lenvatinib]. ? ?MEDICATIONS:  ?Current Outpatient Medications  ?Medication Sig Dispense Refill  ? amLODipine (NORVASC) 10 MG tablet TAKE 1 TABLET BY MOUTH ONCE DAILY. **DECREASE SIMVASTATIN TO 20 MG DAILY WHILE ON AMLODIPINE PER MD** 90 tablet 1  ? busPIRone (BUSPAR) 10 MG tablet Take 1 tablet by mouth 2 times a day 180 tablet 3  ? Calcium Carb-Cholecalciferol 500-600 MG-UNIT TABS Take 1 tablet by mouth daily.     ? Cholecalciferol (VITAMIN D3) 2000 units TABS Take 2,000 Units by mouth daily.     ? cyanocobalamin (,VITAMIN B-12,) 1000 MCG/ML injection Inject 1 mL (1,000 mcg total) into the muscle every 30 (thirty)  days. 1 mL 11  ? hydrochlorothiazide (MICROZIDE) 12.5 MG capsule Take 1 capsule (12.5 mg total) by mouth daily. 90 capsule 3  ? lenvatinib 10 mg daily dose (LENVIMA, 10 MG DAILY DOSE,) capsule TAKE 1 CAPSULE (10 MG TOTAL) BY MOUTH  DAILY. 30 each 11  ? levothyroxine (SYNTHROID) 112 MCG tablet Take 1 tablet by mouth daily on an empty stomach 30 mins before a meal 90 tablet 3  ? lidocaine-prilocaine (EMLA) cream APPLY TO AFFECTED AREA ONCE AS DIRECTED 30 g 3  ? LORazepam (ATIVAN) 0.5 MG tablet Take 0.5 mg by mouth 2 (two) times daily as needed.  0  ? losartan (COZAAR) 25 MG tablet TAKE 1 TABLET BY MOUTH DAILY 90 tablet 3  ? metoprolol tartrate (LOPRESSOR) 25 MG tablet TAKE 1 TABLET BY MOUTH TWICE DAILY 180 tablet 3  ? ondansetron (ZOFRAN) 8 MG tablet Take 1 tablet (8 mg total) by mouth every 8 (eight) hours as needed for nausea. 30 tablet 3  ? pantoprazole (PROTONIX) 40 MG tablet TAKE 1 TABLET BY MOUTH ONCE DAILY 90 tablet 3  ? simvastatin (ZOCOR) 20 MG tablet Take 1 tablet (20 mg total) by mouth daily in the evening. 90 tablet 1  ? tacrolimus (PROTOPIC) 0.1 % ointment Apply 1 application topically daily as needed (rash).     ? tacrolimus (PROTOPIC) 0.1 % ointment Apply one application topically twice daily. 30 g 1  ? tacrolimus (PROTOPIC) 0.1 % ointment Apply to affected area on the skin twice a day 30 g 11  ? venlafaxine XR (EFFEXOR XR) 150 MG 24 hr capsule Take 2 capsules by mouth in the morning with food 180 capsule 3  ? ?No current facility-administered medications for this visit.  ? ?Facility-Administered Medications Ordered in Other Visits  ?Medication Dose Route Frequency Provider Last Rate Last Admin  ? heparin lock flush 100 unit/mL  500 Units Intracatheter Once PRN Diane Lark, MD      ? pembrolizumab (KEYTRUDA) 200 mg in sodium chloride 0.9 % 50 mL chemo infusion  200 mg Intravenous Once Diane Lark, MD 116 mL/hr at 09/23/21 1015 200 mg at 09/23/21 1015  ? sodium chloride flush (NS) 0.9 % injection 10 mL  10 mL Intracatheter PRN Diane Lark, MD      ? ? ?SUMMARY OF ONCOLOGIC HISTORY: ?Oncology History Overview Note  ?[employer] testing done 11-2015 on surgical path from 2014: ?   MS stable ?  TMB low  4 muts/mb ?  ATM [phone] ?   ERBB3 T389K ?  E2H2 rearrangement exon 9 ?  PPP2R1A P179R ?  TP53 I195N  ? ? ?ER- APPROXIMATELY 25-35% STAINING IN NEOPLASTIC CELLS (INTERMEDIATE)  ?PR- APPROXIMATELY 25-35% STAINING IN NEOPLASTIC CELLS (STRONG) ? ?Repeat biopsy 04/02/17: ?ER negative, Her 2 negative ? ?Progressed on Doxil ?  ?Endometrial cancer [facility])  ?06/20/2012 Pathology Results  ? Biopsy positive for papillary serous carcinoma ? ?  ?06/20/2012 Genetic Testing  ? [employer] testing done 11-2015 on surgical path from 2014: ?   MS stable ?  TMB low  4 muts/mb ?  ATM [id number] ?  ERBB3 T389K ?  E2H2 rearrangement exon 9 ?  PPP2R1A P179R ?  TP53 I195N  ? ?  ?06/20/2012 Initial Diagnosis  ? Patient presented to PCP with intermittent vaginal bleeding since ~ Oct 2013, endometrial biopsy 06-20-12 with complex endometrial hyperplasia with atypia ? ?  ?06/26/2012 Imaging  ? Thickened endometrial lining in a postmenopausal patient experiencing vaginal bleeding. In the setting of post-menopausal  bleeding, endometrial sampling is indicated to exclude carcinoma. No focal myometrial abnormalities are seen.  Normal left ovary and non-visualized right ovary ? ?  ?07/30/2012 Surgery  ? Dr. Polly Cobia performed robotic hysterectomy with bilateral salpingo-oophorectomy, bilateral pelvic lymph node dissection and periaortic lymph node dissection. ?Intraoperatively on frozen section, the patient was noted to have a large endometrial polyp with changes within the polyp consistent for high-grade malignancy, possibly papillary serous carcinoma. There is no obvious ?extrauterine disease noted. ? ? ?  ?07/30/2012 Pathology Results  ? [id number] ?SUPPLEMENTAL REPORT  ?THE ENDOMETRIAL CARCINOMA WAS ANALYZED FOR DNA MISMATCH REPAIR PROTEINS.  IMMUNOHISTOCHEMICALLY, THE NEOPLASM RETAINED NUCLEAR EXPRESSION OF 4 GENE PRODUCTS, MLH1, MSH2, MSH6, AND PMS2, INVOLVED IN DNA MISMATCH REPAIR.  POSITIVE AND NEGATIVE CONTROLS WORKED APPROPRIATELY.  ?PER REQUEST, AN ER AND PR ARE PERFORMED ON  BLOCK 1I.  ?ER- APPROXIMATELY 25-35% STAINING IN NEOPLASTIC CELLS (INTERMEDIATE)  ?PR- APPROXIMATELY 25-35% STAINING IN NEOPLASTIC CELLS (STRONG)  ?ER AND PR PREDOMINANTLY SHOW STAINING IN THE SEROUS COMPON

## 2021-09-23 NOTE — Assessment & Plan Note (Signed)
She has intermittent elevated TSH I will adjust her thyroid medicine accordingly 

## 2021-09-23 NOTE — Telephone Encounter (Signed)
Called and given B12 results and instructed to continue b12 injections. She verbalized understanding. ?

## 2021-09-24 ENCOUNTER — Other Ambulatory Visit (HOSPITAL_COMMUNITY): Payer: Self-pay

## 2021-10-03 ENCOUNTER — Other Ambulatory Visit (HOSPITAL_COMMUNITY): Payer: Self-pay

## 2021-10-11 ENCOUNTER — Other Ambulatory Visit (HOSPITAL_COMMUNITY): Payer: Self-pay

## 2021-10-14 ENCOUNTER — Inpatient Hospital Stay: Payer: 59 | Attending: Hematology and Oncology

## 2021-10-14 ENCOUNTER — Other Ambulatory Visit: Payer: Self-pay

## 2021-10-14 ENCOUNTER — Inpatient Hospital Stay: Payer: 59

## 2021-10-14 VITALS — BP 128/69 | HR 69 | Temp 98.1°F | Resp 17 | Wt 150.5 lb

## 2021-10-14 DIAGNOSIS — Z79899 Other long term (current) drug therapy: Secondary | ICD-10-CM | POA: Insufficient documentation

## 2021-10-14 DIAGNOSIS — Z7189 Other specified counseling: Secondary | ICD-10-CM

## 2021-10-14 DIAGNOSIS — D61818 Other pancytopenia: Secondary | ICD-10-CM | POA: Diagnosis not present

## 2021-10-14 DIAGNOSIS — C770 Secondary and unspecified malignant neoplasm of lymph nodes of head, face and neck: Secondary | ICD-10-CM

## 2021-10-14 DIAGNOSIS — C541 Malignant neoplasm of endometrium: Secondary | ICD-10-CM

## 2021-10-14 DIAGNOSIS — Z5112 Encounter for antineoplastic immunotherapy: Secondary | ICD-10-CM | POA: Insufficient documentation

## 2021-10-14 DIAGNOSIS — E039 Hypothyroidism, unspecified: Secondary | ICD-10-CM

## 2021-10-14 DIAGNOSIS — Z95828 Presence of other vascular implants and grafts: Secondary | ICD-10-CM

## 2021-10-14 LAB — CBC WITH DIFFERENTIAL/PLATELET
Abs Immature Granulocytes: 0.01 10*3/uL (ref 0.00–0.07)
Basophils Absolute: 0 10*3/uL (ref 0.0–0.1)
Basophils Relative: 1 %
Eosinophils Absolute: 0.2 10*3/uL (ref 0.0–0.5)
Eosinophils Relative: 6 %
HCT: 31.9 % — ABNORMAL LOW (ref 36.0–46.0)
Hemoglobin: 10.7 g/dL — ABNORMAL LOW (ref 12.0–15.0)
Immature Granulocytes: 0 %
Lymphocytes Relative: 21 %
Lymphs Abs: 0.8 10*3/uL (ref 0.7–4.0)
MCH: 31.8 pg (ref 26.0–34.0)
MCHC: 33.5 g/dL (ref 30.0–36.0)
MCV: 94.7 fL (ref 80.0–100.0)
Monocytes Absolute: 0.3 10*3/uL (ref 0.1–1.0)
Monocytes Relative: 7 %
Neutro Abs: 2.4 10*3/uL (ref 1.7–7.7)
Neutrophils Relative %: 65 %
Platelets: 182 10*3/uL (ref 150–400)
RBC: 3.37 MIL/uL — ABNORMAL LOW (ref 3.87–5.11)
RDW: 13.3 % (ref 11.5–15.5)
WBC: 3.7 10*3/uL — ABNORMAL LOW (ref 4.0–10.5)
nRBC: 0 % (ref 0.0–0.2)

## 2021-10-14 LAB — COMPREHENSIVE METABOLIC PANEL
ALT: 14 U/L (ref 0–44)
AST: 20 U/L (ref 15–41)
Albumin: 3.9 g/dL (ref 3.5–5.0)
Alkaline Phosphatase: 42 U/L (ref 38–126)
Anion gap: 9 (ref 5–15)
BUN: 16 mg/dL (ref 8–23)
CO2: 27 mmol/L (ref 22–32)
Calcium: 9.5 mg/dL (ref 8.9–10.3)
Chloride: 100 mmol/L (ref 98–111)
Creatinine, Ser: 0.83 mg/dL (ref 0.44–1.00)
GFR, Estimated: >60 mL/min (ref >60)
Glucose, Bld: 131 mg/dL — ABNORMAL HIGH (ref 70–99)
Potassium: 3.9 mmol/L (ref 3.5–5.1)
Sodium: 136 mmol/L (ref 135–145)
Total Bilirubin: 0.5 mg/dL (ref 0.3–1.2)
Total Protein: 6.9 g/dL (ref 6.5–8.1)

## 2021-10-14 LAB — TSH: TSH: 1.259 u[IU]/mL (ref 0.350–4.500)

## 2021-10-14 MED ORDER — SODIUM CHLORIDE 0.9% FLUSH
10.0000 mL | Freq: Once | INTRAVENOUS | Status: AC
  Administered 2021-10-14: 10 mL

## 2021-10-14 MED ORDER — SODIUM CHLORIDE 0.9 % IV SOLN
200.0000 mg | Freq: Once | INTRAVENOUS | Status: AC
  Administered 2021-10-14: 200 mg via INTRAVENOUS
  Filled 2021-10-14: qty 200

## 2021-10-14 MED ORDER — SODIUM CHLORIDE 0.9 % IV SOLN: Freq: Once | INTRAVENOUS | Status: AC

## 2021-10-14 MED ORDER — SODIUM CHLORIDE 0.9% FLUSH
10.0000 mL | INTRAVENOUS | Status: DC | PRN
  Administered 2021-10-14: 10 mL

## 2021-10-14 MED ORDER — HEPARIN SOD (PORK) LOCK FLUSH 100 UNIT/ML IV SOLN
500.0000 [IU] | Freq: Once | INTRAVENOUS | Status: AC | PRN
  Administered 2021-10-14: 500 [IU]

## 2021-10-14 NOTE — Patient Instructions (Signed)
[facility]  Discharge Instructions: ?Thank you for choosing [facility] to provide your oncology and hematology care.  ? ?If you have a lab appointment with the Cancer Center, please go directly to the Cancer Center and check in at the registration area. ?  ?Wear comfortable clothing and clothing appropriate for easy access to any Portacath or PICC line.  ? ?We strive to give you quality time with your provider. You may need to reschedule your appointment if you arrive late (15 or more minutes).  Arriving late affects you and other patients whose appointments are after yours.  Also, if you miss three or more appointments without notifying the office, you may be dismissed from the clinic at the provider?s discretion.    ?  ?For prescription refill requests, have your pharmacy contact our office and allow 72 hours for refills to be completed.   ? ?Today you received the following chemotherapy and/or immunotherapy agents: Keytruda ?  ?To help prevent nausea and vomiting after your treatment, we encourage you to take your nausea medication as directed. ? ?BELOW ARE SYMPTOMS THAT SHOULD BE REPORTED IMMEDIATELY: ?*FEVER GREATER THAN 100.4 F (38 ?C) OR HIGHER ?*CHILLS OR SWEATING ?*NAUSEA AND VOMITING THAT IS NOT CONTROLLED WITH YOUR NAUSEA MEDICATION ?*UNUSUAL SHORTNESS OF BREATH ?*UNUSUAL BRUISING OR BLEEDING ?*URINARY PROBLEMS (pain or burning when urinating, or frequent urination) ?*BOWEL PROBLEMS (unusual diarrhea, constipation, pain near the anus) ?TENDERNESS IN MOUTH AND THROAT WITH OR WITHOUT PRESENCE OF ULCERS (sore throat, sores in mouth, or a toothache) ?UNUSUAL RASH, SWELLING OR PAIN  ?UNUSUAL VAGINAL DISCHARGE OR ITCHING  ? ?Items with * indicate a potential emergency and should be followed up as soon as possible or go to the Emergency Department if any problems should occur. ? ?Please show the CHEMOTHERAPY ALERT CARD or IMMUNOTHERAPY ALERT CARD at check-in to the  Emergency Department and triage nurse. ? ?Should you have questions after your visit or need to cancel or reschedule your appointment, please contact [facility]  Dept: [phone]  and follow the prompts.  Office hours are 8:00 a.m. to 4:30 p.m. Monday - Friday. Please note that voicemails left after 4:00 p.m. may not be returned until the following business day.  We are closed weekends and major holidays. You have access to a nurse at all times for urgent questions. Please call the main number to the clinic Dept: [phone] and follow the prompts. ? ? ?For any non-urgent questions, you may also contact your provider using MyChart. We now offer e-Visits for anyone 18 and older to request care online for non-urgent symptoms. For details visit [email or web address]. ?  ?Also download the MyChart app! Go to the app store, search "MyChart", open the app, select [facility], and log in with your MyChart username and password. ? ?Due to Covid, a mask is required upon entering the hospital/clinic. If you do not have a mask, one will be given to you upon arrival. For doctor visits, patients may have 1 support person aged 18 or older with them. For treatment visits, patients cannot have anyone with them due to current Covid guidelines and our immunocompromised population.  ? ?

## 2021-10-18 ENCOUNTER — Other Ambulatory Visit: Payer: Self-pay | Admitting: Hematology and Oncology

## 2021-10-18 ENCOUNTER — Other Ambulatory Visit (HOSPITAL_COMMUNITY): Payer: Self-pay

## 2021-10-18 MED ORDER — PANTOPRAZOLE SODIUM 40 MG PO TBEC
DELAYED_RELEASE_TABLET | Freq: Every day | ORAL | 3 refills | Status: DC
  Filled 2021-10-18: qty 90, 90d supply, fill #0
  Filled 2022-02-13: qty 90, 90d supply, fill #1
  Filled 2022-06-08: qty 90, 90d supply, fill #2
  Filled 2022-10-13: qty 90, 90d supply, fill #3

## 2021-10-18 MED ORDER — SIMVASTATIN 20 MG PO TABS
ORAL_TABLET | ORAL | 3 refills | Status: DC
  Filled 2021-10-18: qty 90, 90d supply, fill #0
  Filled 2022-01-13: qty 90, 90d supply, fill #1
  Filled 2022-04-19: qty 90, 90d supply, fill #2
  Filled 2022-07-24: qty 90, 90d supply, fill #3

## 2021-10-25 ENCOUNTER — Other Ambulatory Visit (HOSPITAL_COMMUNITY): Payer: Self-pay

## 2021-10-28 ENCOUNTER — Other Ambulatory Visit (HOSPITAL_COMMUNITY): Payer: Self-pay

## 2021-11-01 DIAGNOSIS — M25561 Pain in right knee: Secondary | ICD-10-CM | POA: Diagnosis not present

## 2021-11-03 ENCOUNTER — Other Ambulatory Visit (HOSPITAL_COMMUNITY): Payer: Self-pay

## 2021-11-04 ENCOUNTER — Inpatient Hospital Stay: Payer: 59

## 2021-11-04 ENCOUNTER — Inpatient Hospital Stay: Payer: 59 | Attending: Hematology and Oncology | Admitting: Hematology and Oncology

## 2021-11-04 ENCOUNTER — Other Ambulatory Visit: Payer: Self-pay

## 2021-11-04 ENCOUNTER — Other Ambulatory Visit (HOSPITAL_COMMUNITY): Payer: Self-pay

## 2021-11-04 ENCOUNTER — Encounter: Payer: Self-pay | Admitting: Hematology and Oncology

## 2021-11-04 DIAGNOSIS — Z79899 Other long term (current) drug therapy: Secondary | ICD-10-CM | POA: Diagnosis not present

## 2021-11-04 DIAGNOSIS — C770 Secondary and unspecified malignant neoplasm of lymph nodes of head, face and neck: Secondary | ICD-10-CM

## 2021-11-04 DIAGNOSIS — C541 Malignant neoplasm of endometrium: Secondary | ICD-10-CM | POA: Insufficient documentation

## 2021-11-04 DIAGNOSIS — I10 Essential (primary) hypertension: Secondary | ICD-10-CM | POA: Diagnosis not present

## 2021-11-04 DIAGNOSIS — D61818 Other pancytopenia: Secondary | ICD-10-CM | POA: Diagnosis not present

## 2021-11-04 DIAGNOSIS — E039 Hypothyroidism, unspecified: Secondary | ICD-10-CM

## 2021-11-04 DIAGNOSIS — Z5112 Encounter for antineoplastic immunotherapy: Secondary | ICD-10-CM | POA: Insufficient documentation

## 2021-11-04 DIAGNOSIS — Z95828 Presence of other vascular implants and grafts: Secondary | ICD-10-CM

## 2021-11-04 DIAGNOSIS — Z7189 Other specified counseling: Secondary | ICD-10-CM

## 2021-11-04 LAB — COMPREHENSIVE METABOLIC PANEL
ALT: 19 U/L (ref 0–44)
AST: 19 U/L (ref 15–41)
Albumin: 4.1 g/dL (ref 3.5–5.0)
Alkaline Phosphatase: 44 U/L (ref 38–126)
Anion gap: 6 (ref 5–15)
BUN: 28 mg/dL — ABNORMAL HIGH (ref 8–23)
CO2: 28 mmol/L (ref 22–32)
Calcium: 9.8 mg/dL (ref 8.9–10.3)
Chloride: 101 mmol/L (ref 98–111)
Creatinine, Ser: 0.82 mg/dL (ref 0.44–1.00)
GFR, Estimated: >60 mL/min (ref >60)
Glucose, Bld: 91 mg/dL (ref 70–99)
Potassium: 4.2 mmol/L (ref 3.5–5.1)
Sodium: 135 mmol/L (ref 135–145)
Total Bilirubin: 0.4 mg/dL (ref 0.3–1.2)
Total Protein: 7 g/dL (ref 6.5–8.1)

## 2021-11-04 LAB — CBC WITH DIFFERENTIAL/PLATELET
Abs Immature Granulocytes: 0.01 10*3/uL (ref 0.00–0.07)
Basophils Absolute: 0 10*3/uL (ref 0.0–0.1)
Basophils Relative: 1 %
Eosinophils Absolute: 0 10*3/uL (ref 0.0–0.5)
Eosinophils Relative: 0 %
HCT: 34 % — ABNORMAL LOW (ref 36.0–46.0)
Hemoglobin: 11.4 g/dL — ABNORMAL LOW (ref 12.0–15.0)
Immature Granulocytes: 0 %
Lymphocytes Relative: 19 %
Lymphs Abs: 1.1 10*3/uL (ref 0.7–4.0)
MCH: 31.8 pg (ref 26.0–34.0)
MCHC: 33.5 g/dL (ref 30.0–36.0)
MCV: 95 fL (ref 80.0–100.0)
Monocytes Absolute: 0.4 10*3/uL (ref 0.1–1.0)
Monocytes Relative: 7 %
Neutro Abs: 4.3 10*3/uL (ref 1.7–7.7)
Neutrophils Relative %: 73 %
Platelets: 221 10*3/uL (ref 150–400)
RBC: 3.58 MIL/uL — ABNORMAL LOW (ref 3.87–5.11)
RDW: 13.4 % (ref 11.5–15.5)
WBC: 5.8 10*3/uL (ref 4.0–10.5)
nRBC: 0 % (ref 0.0–0.2)

## 2021-11-04 LAB — TSH: TSH: 5.269 u[IU]/mL — ABNORMAL HIGH (ref 0.350–4.500)

## 2021-11-04 MED ORDER — SODIUM CHLORIDE 0.9 % IV SOLN
200.0000 mg | Freq: Once | INTRAVENOUS | Status: AC
  Administered 2021-11-04: 200 mg via INTRAVENOUS
  Filled 2021-11-04: qty 200

## 2021-11-04 MED ORDER — SODIUM CHLORIDE 0.9 % IV SOLN: Freq: Once | INTRAVENOUS | Status: AC

## 2021-11-04 MED ORDER — SODIUM CHLORIDE 0.9% FLUSH
10.0000 mL | INTRAVENOUS | Status: DC | PRN
  Administered 2021-11-04: 10 mL

## 2021-11-04 MED ORDER — SODIUM CHLORIDE 0.9% FLUSH
10.0000 mL | Freq: Once | INTRAVENOUS | Status: AC
  Administered 2021-11-04: 10 mL

## 2021-11-04 MED ORDER — HEPARIN SOD (PORK) LOCK FLUSH 100 UNIT/ML IV SOLN
500.0000 [IU] | Freq: Once | INTRAVENOUS | Status: AC | PRN
  Administered 2021-11-04: 500 [IU]

## 2021-11-04 NOTE — Assessment & Plan Note (Signed)
Her blood pressure is stable She will continue Lenvima and her current blood pressure treatment 

## 2021-11-04 NOTE — Progress Notes (Signed)
[name] OFFICE PROGRESS NOTE  Patient Care Team: [name], MD as PCP - General (Family Medicine) Reynold Bowen, MD as Consulting Physician (Endocrinology)  ASSESSMENT & PLAN:  Endometrial cancer [facility]) Her last PET CT showed excellent response to treatment PET CT scan is considered to be normal She will continue maintenance pembrolizumab with Lenvima indefinitely I plan to repeat imaging study again at the end of the year   Pancytopenia, acquired [facility]) She is not consistent with her B12 injection I reminded her to take her B12 injection on the first day of the month consistently That should improve her pancytopenia  Essential hypertension Her blood pressure is stable She will continue Lenvima and her current blood pressure treatment  No orders of the defined types were placed in this encounter.   All questions were answered. The patient knows to call the clinic with any problems, questions or concerns. The total time spent in the appointment was 25 minutes encounter with patients including review of chart and various tests results, discussions about plan of care and coordination of care plan   [name], MD 11/04/2021 10:35 AM  INTERVAL HISTORY: Please see below for problem oriented charting. she returns for treatment follow-up on pembrolizumab and Lenvima She is doing well No new symptoms Her blood pressure is reasonably well controlled at home She is not consistent with her B12 injection Otherwise, she tolerated treatment very well  REVIEW OF SYSTEMS:   Constitutional: Denies fevers, chills or abnormal weight loss Eyes: Denies blurriness of vision Ears, nose, mouth, throat, and face: Denies mucositis or sore throat Respiratory: Denies cough, dyspnea or wheezes Cardiovascular: Denies palpitation, chest discomfort or lower extremity swelling Gastrointestinal:  Denies nausea, heartburn or change in bowel habits Skin: Denies abnormal skin  rashes Lymphatics: Denies new lymphadenopathy or easy bruising Neurological:Denies numbness, tingling or new weaknesses Behavioral/Psych: Mood is stable, no new changes  All other systems were reviewed with the patient and are negative.  I have reviewed the past medical history, past surgical history, social history and family history with the patient and they are unchanged from previous note.  ALLERGIES:  is allergic to lenvima [lenvatinib].  MEDICATIONS:  Current Outpatient Medications  Medication Sig Dispense Refill   amLODipine (NORVASC) 10 MG tablet TAKE 1 TABLET BY MOUTH ONCE DAILY. **DECREASE SIMVASTATIN TO 20 MG DAILY WHILE ON AMLODIPINE PER MD** 90 tablet 1   busPIRone (BUSPAR) 10 MG tablet Take 1 tablet by mouth 2 times a day 180 tablet 3   Calcium Carb-Cholecalciferol 500-600 MG-UNIT TABS Take 1 tablet by mouth daily.      Cholecalciferol (VITAMIN D3) 2000 units TABS Take 2,000 Units by mouth daily.      cyanocobalamin (,VITAMIN B-12,) 1000 MCG/ML injection Inject 1 mL (1,000 mcg total) into the muscle every 30 (thirty) days. 1 mL 11   hydrochlorothiazide (MICROZIDE) 12.5 MG capsule Take 1 capsule (12.5 mg total) by mouth daily. 90 capsule 3   lenvatinib 10 mg daily dose (LENVIMA, 10 MG DAILY DOSE,) capsule TAKE 1 CAPSULE (10 MG TOTAL) BY MOUTH DAILY. 30 each 11   levothyroxine (SYNTHROID) 112 MCG tablet Take 1 tablet by mouth daily on an empty stomach 30 mins before a meal 90 tablet 3   lidocaine-prilocaine (EMLA) cream APPLY TO AFFECTED AREA ONCE AS DIRECTED 30 g 3   LORazepam (ATIVAN) 0.5 MG tablet Take 0.5 mg by mouth 2 (two) times daily as needed.  0   losartan (COZAAR) 25 MG tablet TAKE 1 TABLET  BY MOUTH DAILY 90 tablet 3   metoprolol tartrate (LOPRESSOR) 25 MG tablet TAKE 1 TABLET BY MOUTH TWICE DAILY 180 tablet 3   ondansetron (ZOFRAN) 8 MG tablet Take 1 tablet (8 mg total) by mouth every 8 (eight) hours as needed for nausea. 30 tablet 3   pantoprazole (PROTONIX) 40 MG  tablet TAKE 1 TABLET BY MOUTH ONCE DAILY 90 tablet 3   simvastatin (ZOCOR) 20 MG tablet Take 1 tablet by mouth daily in the evening. 90 tablet 3   tacrolimus (PROTOPIC) 0.1 % ointment Apply 1 application topically daily as needed (rash).      tacrolimus (PROTOPIC) 0.1 % ointment Apply one application topically twice daily. 30 g 1   tacrolimus (PROTOPIC) 0.1 % ointment Apply to affected area on the skin twice a day 30 g 11   venlafaxine XR (EFFEXOR XR) 150 MG 24 hr capsule Take 2 capsules by mouth in the morning with food 180 capsule 3   No current facility-administered medications for this visit.   Facility-Administered Medications Ordered in Other Visits  Medication Dose Route Frequency Provider Last Rate Last Admin   heparin lock flush 100 unit/mL  500 Units Intracatheter Once PRN Alvy Bimler, Adisa Vigeant, MD       pembrolizumab (KEYTRUDA) 200 mg in sodium chloride 0.9 % 50 mL chemo infusion  200 mg Intravenous Once Alvy Bimler, Mauriah Mcmillen, MD       sodium chloride flush (NS) 0.9 % injection 10 mL  10 mL Intracatheter PRN [name], MD        SUMMARY OF ONCOLOGIC HISTORY: Oncology History Overview Note  [facility] [employer] testing done 11-2015 on surgical path from 2014:    MS stable   TMB low  4 muts/mb   ATM [id number]   ERBB3 T389K   E2H2 rearrangement exon 9   PPP2R1A P179R   TP53 I195N    ER- APPROXIMATELY 25-35% STAINING IN NEOPLASTIC CELLS (INTERMEDIATE)  PR- APPROXIMATELY 25-35% STAINING IN NEOPLASTIC CELLS (STRONG)  Repeat biopsy 04/02/17: ER negative, Her 2 negative  Progressed on Doxil   Endometrial cancer ([place])  06/20/2012 Pathology Results   Biopsy positive for papillary serous carcinoma    06/20/2012 Genetic Testing   [facility] [employer] testing done 11-2015 on surgical path from 2014:    MS stable   TMB low  4 muts/mb   ATM [id number]   ERBB3 T389K   E2H2 rearrangement exon 9   PPP2R1A P179R   TP53 I195N     06/20/2012 Initial Diagnosis   Patient presented to PCP with intermittent vaginal  bleeding since ~ Oct 2013, endometrial biopsy 06-20-12 with complex endometrial hyperplasia with atypia    06/26/2012 Imaging   Thickened endometrial lining in a postmenopausal patient experiencing vaginal bleeding. In the setting of post-menopausal bleeding, endometrial sampling is indicated to exclude carcinoma. No focal myometrial abnormalities are seen.  Normal left ovary and non-visualized right ovary    07/30/2012 Surgery   Dr. Polly Cobia performed robotic hysterectomy with bilateral salpingo-oophorectomy, bilateral pelvic lymph node dissection and periaortic lymph node dissection. Intraoperatively on frozen section, the patient was noted to have a large endometrial polyp with changes within the polyp consistent for high-grade malignancy, possibly papillary serous carcinoma. There is no obvious extrauterine disease noted.     07/30/2012 Pathology Results   [phone] SUPPLEMENTAL REPORT  THE ENDOMETRIAL CARCINOMA WAS ANALYZED FOR DNA MISMATCH REPAIR PROTEINS.  IMMUNOHISTOCHEMICALLY, THE NEOPLASM RETAINED NUCLEAR EXPRESSION OF 4 GENE PRODUCTS, MLH1, MSH2, MSH6, AND PMS2, INVOLVED IN DNA MISMATCH REPAIR.  POSITIVE  AND NEGATIVE CONTROLS WORKED APPROPRIATELY.  PER REQUEST, AN ER AND PR ARE PERFORMED ON BLOCK 1I.  ER- APPROXIMATELY 25-35% STAINING IN NEOPLASTIC CELLS (INTERMEDIATE)  PR- APPROXIMATELY 25-35% STAINING IN NEOPLASTIC CELLS (STRONG)  ER AND PR PREDOMINANTLY SHOW STAINING IN THE SEROUS COMPONENT.  DIAGNOSIS  1. UTERUS, CERVIX WITH BILATERAL FALLOPIAN TUBES AND OVARIES,  HYSTERECTOMY AND BILATERAL SALPINGO-OOPHORECTOMY:  HIGH GRADE/POORLY DIFFERENTIATED ENDOMETRIAL ADENOCARCINOMA WITH SOLID AND SEROUS PAPILLARY COMPONENTS.  HISTOPATHOLOGIC TYPE:    A VARIETY OF PATTERNS WERE PRESENT, ALL OF WHICH SHOULD BE CONSIDERED TO BE HIGH GRADE.  SEROUS PAPILLARY CARCINOMA WAS SEEN OCCURRING ADJACENT TO SOLID ENDOMETRIAL CARCINOMA WITH MARKED ANAPLASIA AND GIANT CELLS. SIZE: TUMOR MEASURED AT  LEAST 4.8 CM IN GREATEST HORIZONTAL DIMENSION.  GRADE: POORLY DIFFERENTIATED OR HIGH GRADE. DEPTH OF INVASION: NO DEFINITE MYOMETRIAL INVOLVEMENT WAS SEEN.  THE TUMOR APPEARED CONFINED TO THE POLYP AS WELL AS SURFACE ENDOMETRIUM. WHERE MYOMETRIAL THICKNESS NOT APPLICABLE. SEROSAL INVOLVEMENT:       NOT DEMONSTRATED.  ENDOCERVICAL INVOLVEMENT:  NOT DEMONSTRATED. RESECTION MARGINS:         FREE OF INVOLVEMENT. EXTRAUTERINE EXTENSION:    NOT DEMONSTRATED. ANGIOLYMPHATIC INVASION:   NOT DEFINITELY SEEN. TOTAL NODES EXAMINED:      22.    PELVIC NODES EXAMINED:  20.    PELVIC NODES INVOLVED:  0.    PARA-AORTIC NODES       2. EXAMINED:    PARA-AORTIC NODES       0. INVOLVED TNM STAGE:                 T1A N0 MX. AJCC STAGE GROUPING:       IA. FIGO STAGE:                IA.    08/26/2012 Imaging   No CT evidence for intra-abdominal or pelvic metastatic disease. Trace free pelvic fluid, presumably postoperative although the date of surgery is not documented in the electronic medical record.    08/26/2012 - 12/13/2012 Chemotherapy   She received 6 cycles of carbo/taxol     10/29/2012 - 11/27/2012 Radiation Therapy   May 27, June 5,  June 11, June 19, November 27, 2012: Proximal vagina 30 Gy in 5 fractions      01/17/2013 Imaging   1.  No evidence of recurrent or metastatic disease. 2.  No acute abnormality involving the abdomen or pelvis. 3.  Mild diffuse hepatic steatosis. 4.  Very small supraumbilical midline anterior abdominal wall hernia containing fat, unchanged    01/22/2014 Imaging   No evidence for metastatic or recurrent disease. 2. No bowel obstruction.  Normal appendix. 3. Small fat containing hernia, stable in appearance. 4. Status post hysterectomy and bilateral oophorectomy    02/19/2014 Imaging   No pulmonary lesions are identified. The abnormality on the chest x-ray is due to asymmetric left-sided sternoclavicular joint degenerative disease    10/12/2014 Imaging   New mild  retroperitoneal lymphadenopathy in the left paraaortic region and proximal left common iliac chain, consistent with metastatic disease. No other sites of metastatic disease identified within the abdomen or pelvis.        11/27/2014 - 02/11/2015 Chemotherapy   She received 4 cycles of carbo/taxol     03/01/2015 PET scan   Single hypermetabolic small retroperitoneal lymph node along the aorta. 2. No evidence of metastatic disease otherwise in the abdomen or pelvis. No evidence local recurrence. 3. Intensely hypermetabolic enlarged nodule adjacent to the RIGHT lobe of thyroid gland. This  presumably represents the biopsied lesion in clinician report which was found to be benign thyroid tissue.    04/05/2015 - 05/13/2015 Radiation Therapy   She received 50.4 gray in 28 fractions with simultaneous integrated boost to 56 gray    06/17/2015 Imaging   No acute process or evidence of metastatic disease in the abdomen or pelvis. Resolution of previously described retroperitoneal adenopathy. 2.  Possible constipation. 3. Atherosclerosis.    06/17/2015 Tumor Marker   Patient's tumor was tested for the following markers: CA125 Results of the tumor marker test revealed 33    08/12/2015 Tumor Marker   Patient's tumor was tested for the following markers: CA125 Results of the tumor marker test revealed 52    08/31/2015 PET scan   Development of right paratracheal hypermetabolic adenopathy, consistent with nodal metastasis. 2. The previously described isolated abdominal retroperitoneal hypermetabolic node has resolved. 3. Persistent hypermetabolic right thyroid nodule, per report previously biopsied. Correlate with those results. 4.  Possible constipation.    09/09/2015 -  Anti-estrogen oral therapy   She has been receiving alternative treatment between megace and tamoxifen    09/09/2015 Tumor Marker   Patient's tumor was tested for the following markers: CA125 Results of the tumor marker test revealed  37.8    09/20/2015 Procedure   Technically successful ultrasound-guided thyroid aspiration biopsy , dominant right nodule    09/20/2015 Pathology Results   THYROID, RIGHT, FINE NEEDLE ASPIRATION (SPECIMEN 1 OF 1 COLLECTED 09/20/15): FINDINGS CONSISTENT WITH BENIGN THYROID NODULE (BETHESDA CATEGORY II).    10/28/2015 Tumor Marker   Patient's tumor was tested for the following markers: CA125 Results of the tumor marker test revealed 53.2    11/04/2015 Imaging   Stable benign right thyroid nodule    12/16/2015 Tumor Marker   Patient's tumor was tested for the following markers: CA125 Results of the tumor marker test revealed 38.1    03/22/2016 PET scan   Interval progression of hypermetabolic right paratracheal lymph node consistent with metastatic involvement. 2. Stable hypermetabolic right thyroid nodule. Reportedly this has been biopsied in the past.    03/23/2016 Tumor Marker   Patient's tumor was tested for the following markers: CA125 Results of the tumor marker test revealed 62.4    04/13/2016 - 06/01/2016 Radiation Therapy   She received 56 Gy to the chest in 28 fractions    05/09/2016 Imaging   No evidence of left lower extremity deep vein thrombosis. No evidence of a superficial thrombosis of the greater and lesser saphenous veins. Positive for thrombus noted in several varicosities of the calf. No evidence of Baker's cyst on the left.    05/17/2016 Tumor Marker   Patient's tumor was tested for the following markers: CA125 Results of the tumor marker test revealed 9    06/29/2016 Tumor Marker   Patient's tumor was tested for the following markers: CA125 Results of the tumor marker test revealed 5.9    08/04/2016 Tumor Marker   Patient's tumor was tested for the following markers: CA125 Results of the tumor marker test revealed 6.0    09/12/2016 PET scan   Complete metabolic response to therapy, with resolution of hypermetabolic mediastinal lymphadenopathy since  prior exam. No residual or new metastatic disease identified. Stable hypermetabolic right thyroid lobe nodule, which was previously biopsied on 09/20/2015.    03/13/2017 PET scan   1. Solitary focus of recurrent right paratracheal hypermetabolic activity, with a 1.0 cm right lower paratracheal node having a maximum SUV of 9.0. Appearance compatible with  recurrent malignancy. 2. Continued hypermetabolic right thyroid nodule, previously biopsied, presumed benign -correlate with prior biopsy results. 3. Other imaging findings of potential clinical significance: Aortic Atherosclerosis (ICD10-I70.0). Mild cardiomegaly. Prominent stool throughout the colon favors constipation.    04/02/2017 Pathology Results   FINE NEEDLE ASPIRATION, ENDOSCOPIC, (EBUS) 4 R NODE (SPECIMEN 1 OF 2 COLLECTED 04/02/17): MALIGNANT CELLS PRESENT, CONSISTENT WITH CARCINOMA. SEE COMMENT. COMMENT: THE MALIGNANT CELLS ARE POSITIVE FOR P53 AND NEGATIVE FOR ESTROGEN RECEPTOR AND TTF-1. THIS PROFILE IS NON-SPECIFIC, BUT THE P53 POSITIVE STAINING IS SUGGESTIVE OF GYNECOLOGIC PRIMARY.     04/11/2017 Procedure   Successful 8 French right internal jugular vein power port placement with its tip at the SVC/RA junction    04/12/2017 Imaging   Normal LV size with mild LV hypertrophy. EF 55-60%. Normal RV size and systolic function. Aortic valve sclerosis without significant stenosis.    04/18/2017 - 06/14/2017 Chemotherapy   The patient had chemotherapy with Doxil     07/10/2017 Imaging   - Left ventricle: The cavity size was normal. There was mild concentric hypertrophy. Systolic function was normal. The estimated ejection fraction was in the range of 55% to 60%. Wall motion was normal; there were no regional wall motion abnormalities. Doppler parameters are consistent with abnormal left ventricular relaxation (grade 1 diastolic dysfunction). - Aortic valve: Noncoronary cusp mobility was mildly restricted. - Mitral valve: There was  mild regurgitation. - Left atrium: The atrium was mildly dilated. - Atrial septum: No defect or patent foramen ovale was identified.  Impressions:  - Compared to November 2018, global LV longitudinal strain remains normal (has increased)    07/11/2017 PET scan   Increased size and hypermetabolic activity of 3.3 cm right thyroid lobe nodule. Thyroid carcinoma cannot be excluded. Recommend repeat ultrasound guided fine needle aspiration to exclude thyroid carcinoma.  New adjacent hypermetabolic 10 mm right supraclavicular lymph node, suspicious for lymph node metastasis.  Slight increase in size and hypermetabolic activity of solitary right paratracheal lymph node.  No evidence of abdominal or pelvic metastatic disease.    07/18/2017 Procedure   1. Technically successful ultrasound guided fine needle aspiration of indeterminate hypermetabolic right-sided thyroid nodule/mass. 2. Technically successful ultrasound-guided core needle biopsy of hypermetabolic right lower cervical lymph node.    07/18/2017 Pathology Results   THYROID, FINE NEEDLE ASPIRATION RIGHT (SPECIMEN 1 OF 1, COLLECTED ON 07/18/2017): ATYPIA OF UNDETERMINED SIGNIFICANCE OR FOLLICULAR LESION OF UNDETERMINED SIGNIFICANCE (BETHESDA CATEGORY III). SEE COMMENT. COMMENT: THE SPECIMEN CONSISTS OF SMALL AND MEDIUM SIZED GROUPS OF FOLLICULAR EPITHELIAL CELLS WITH MILD CYTOLOGIC ATYPIA INCLUDING NUCLEAR ENLARGEMENT AND HURTHLE CELL CHANGE. SOME GROUPS ARE ARRANGED AS MICROFOLLICLES. THERE IS MINIMAL BACKGROUND COLLOID. BASED ON THESE FEATURES, A FOLLICULAR LESION/NEOPLASM CAN NOT BE ENTIRELY RULED OUT. A SPECIMEN WILL BE SENT FOR AFIRMA TESTING.    07/19/2017 Pathology Results   Lymph node, needle/core biopsy - METASTATIC PAPILLARY SEROUS CARCINOMA. - SEE COMMENT. Microscopic Comment Dr. Vicente Males has reviewed the case and concurs with this interpretation    10/15/2017 PET scan   No new or progressive disease. No evidence of  abdominal or pelvic metastatic disease.  Stable hypermetabolic right thyroid lobe nodule and adjacent right supraclavicular lymph node.  Decreased size and hypermetabolic activity of solitary right paratracheal lymph node.    01/23/2018 PET scan   1. Overall, no significant change from PET-CT of 3 months ago. There is persistent hypermetabolic activity at the right thoracic inlet and within a right paratracheal mediastinal node. The nodes have not significantly  changed in size, although the metabolic activity has minimally increased over this interval. It is uncertain how much of the thoracic inlet metabolic activity is attributed to the thyroid nodule versus adjacent cervical lymph nodes, both previously biopsied. 2. No new hypermetabolic activity within the neck, chest, abdomen or pelvis. No evidence of local recurrence in the pelvis. 3. Stable probable radiation changes in the right lung.    04/16/2018 PET scan   1. Interval increase in metabolic activity of the RIGHT supraclavicular node and RIGHT lower paratracheal mediastinal node with minimal change in size. Findings consistent with persistent and mildly progressed metastatic adenopathy. 2. New hypermetabolic small lymph node in the RIGHT lower paratracheal nodal station adjacent to the previously followed node. 3. No evidence of local recurrence in the pelvis or new disease elsewhere.    07/30/2018 PET scan   1. Mild interval progression of right supraclavicular, mediastinal, and right hilar hypermetabolic metastatic disease. There is a new hypermetabolic lymph node in the subcarinal station on today's study. 2. No hypermetabolic disease in the abdomen or pelvis to suggest recurrent/metastases. 3.  Aortic Atherosclerois (ICD10-170.0)      08/16/2018 -  Chemotherapy   The patient had Lenvima since 3/6 and pembrolizumab since 3/13 for chemotherapy treatment. Michel Santee was held temporarily due to infection and severe hypertension     10/31/2018 PET scan   Partial response to therapy, with decreased hypermetabolic lymphadenopathy in mediastinum and right hilar region.   No significant change in hypermetabolic lymphadenopathy in right inferior neck.   No new or progressive metastatic disease identified. No evidence of recurrent or metastatic carcinoma in the pelvis or abdomen    02/04/2019 PET scan   1. Decrease in metabolic activity of RIGHT supraclavicular node and RIGHT paratracheal lymph nodes. 2. Persistent activity in the RIGHT thyroid nodule unchanged. 3. Diffuse activity through the esophagus is favored esophagitis. No change. 4. Scattered foci of intense metabolic activity associated the bowel without focal lesion on CT favored benign. 5. No evidence of peritoneal metastasis. 6. No evidence of metastatic adenopathy in the abdomen pelvis. 7. No evidence of local pelvic sidewall recurrence.   07/30/2019 PET scan   1. Mildly increased uptake within small nodes along the right sternocleidomastoid without change in size and associated with increased deltoid activity may reflect changes related to right arm injection or recent COVID vaccination, consider clinical correlation and attention on follow-up. 2. Persistent activity in the right thyroid nodule, unchanged from previous exams. 3. New activity in a juxta esophageal lymph node in the chest is suspicious though the node is unchanged with respect to size. Endoscopic correlation could potentially be helpful or close attention on follow-up. 4. Subtle increased FDG uptake along the left mainstem bronchus and adjacent to the aorta may represent a small lymph node. No discrete correlate is demonstrated on today's study.   10/22/2019 PET scan   1. Persistent FDG avid subcentimeter right supraclavicular and juxta esophageal lymph nodes. The degree of FDG uptake is similar to the previous exam. No new or progressive findings identified. 2. No findings of solid organ metastasis,  skeletal metastasis or metastatic disease to the abdomen or pelvis. 3. FDG avid right lobe of thyroid gland nodule is again noted This nodule is not incidental and been worked up previously with 2 biopsies. Please refer to results from the most recent biopsy dated 07/18/2017. (Ref: J Am Coll Radiol. 2015 Feb;12(2): 143-50). 4.  Aortic Atherosclerosis (ICD10-I70.0).   05/05/2020 PET scan   1. Progressive disease, as  evidenced by increased hypermetabolism within right supraclavicular and mediastinal nodes. 2. No infradiaphragmatic hypermetabolic metastasis. 3.  Aortic Atherosclerosis (ICD10-I70.0). 4. Hypermetabolic right thyroid nodule has been detailed on prior exams and was biopsied on 07/18/2017   09/10/2020 PET scan   1. Interval improvement in previously demonstrated hypermetabolic mediastinal and right supraclavicular adenopathy. A single hypermetabolic left paraesophageal lymph node remains, improved from previous study. The other nodes have resolved.  2. No progressive disease. 3. Diffuse esophageal hypermetabolic activity suggesting esophagitis. 4. Hypermetabolic right thyroid nodule has been previously biopsied.   03/18/2021 PET scan   1. No significant change in single residual FDG avid left paraesophageal lymph node. This has an SUV max of 7.03, compared with 6.4 previously. No new sites of disease identified. 2. Diffuse esophageal hypermetabolic activity suggestive of esophagitis. 3. FDG avid right lobe of thyroid gland nodule. This has been biopsied previously. (Ref: J Am Coll Radiol. 2015 Feb;12(2): 143-50).     09/22/2021 PET scan   1. Persistent mild diffuse FDG uptake in the esophagus likely chronic inflammation. 2. Resolution of the left paraesophageal lymph node seen on the prior study. 3. Stable hypermetabolic right thyroid nodule, previously biopsied. 4. No findings for recurrent abdominal/pelvic disease or metastatic disease.     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 Lenvima was held temporarily due to infection and severe hypertension      PHYSICAL EXAMINATION: ECOG PERFORMANCE STATUS: 0 - Asymptomatic  Vitals:   11/04/21 0954  BP: (!) 148/68  Pulse: 66  Resp: 18  Temp: 97.8 F (36.6 C)  SpO2: 96%   Filed Weights   11/04/21 0954  Weight: 148 lb 3.2 oz (67.2 kg)    GENERAL:alert, no distress and comfortable NEURO: alert & oriented x 3 with fluent speech, no focal motor/sensory deficits  LABORATORY DATA:  I have reviewed the data as listed    Component Value Date/Time   NA 135 11/04/2021 0933   NA 141 05/17/2017 0957   K 4.2 11/04/2021 0933   K 3.6 05/17/2017 0957   CL 101 11/04/2021 0933   CL 105 11/19/2012 0848   CO2 28 11/04/2021 0933   CO2 25 05/17/2017 0957   GLUCOSE 91 11/04/2021 0933   GLUCOSE 85 05/17/2017 0957   GLUCOSE 122 (H) 11/19/2012 0848   BUN 28 (H) 11/04/2021 0933   BUN 15.2 05/17/2017 0957   CREATININE 0.82 11/04/2021 0933   CREATININE 0.84 03/18/2021 1003   CREATININE 0.8 05/17/2017 0957   CALCIUM 9.8 11/04/2021 0933   CALCIUM 9.6 05/17/2017 0957   PROT 7.0 11/04/2021 0933   PROT 7.0 05/17/2017 0957   ALBUMIN 4.1 11/04/2021 0933   ALBUMIN 3.9 05/17/2017 0957   AST 19 11/04/2021 0933   AST 24 03/18/2021 1003   AST 25 05/17/2017 0957   ALT 19 11/04/2021 0933   ALT 21 03/18/2021 1003   ALT 18 05/17/2017 0957   ALKPHOS 44 11/04/2021 0933   ALKPHOS 57 05/17/2017 0957   BILITOT 0.4 11/04/2021 0933   BILITOT 0.6 03/18/2021 1003   BILITOT 0.47 05/17/2017 0957   GFRNONAA >60 11/04/2021 0933   GFRNONAA >60 03/18/2021 1003   GFRAA >60 02/26/2020 0940   GFRAA >60 07/10/2019 1240    No results found for: SPEP, UPEP  Lab Results  Component Value Date  WBC 5.8 11/04/2021   NEUTROABS 4.3  11/04/2021   HGB 11.4 (L) 11/04/2021   HCT 34.0 (L) 11/04/2021   MCV 95.0 11/04/2021   PLT 221 11/04/2021      Chemistry      Component Value Date/Time   NA 135 11/04/2021 0933   NA 141 05/17/2017 0957   K 4.2 11/04/2021 0933   K 3.6 05/17/2017 0957   CL 101 11/04/2021 0933   CL 105 11/19/2012 0848   CO2 28 11/04/2021 0933   CO2 25 05/17/2017 0957   BUN 28 (H) 11/04/2021 0933   BUN 15.2 05/17/2017 0957   CREATININE 0.82 11/04/2021 0933   CREATININE 0.84 03/18/2021 1003   CREATININE 0.8 05/17/2017 0957   GLU 158 (H) 01/14/2015 1035      Component Value Date/Time   CALCIUM 9.8 11/04/2021 0933   CALCIUM 9.6 05/17/2017 0957   ALKPHOS 44 11/04/2021 0933   ALKPHOS 57 05/17/2017 0957   AST 19 11/04/2021 0933   AST 24 03/18/2021 1003   AST 25 05/17/2017 0957   ALT 19 11/04/2021 0933   ALT 21 03/18/2021 1003   ALT 18 05/17/2017 0957   BILITOT 0.4 11/04/2021 0933   BILITOT 0.6 03/18/2021 1003   BILITOT 0.47 05/17/2017 0957

## 2021-11-04 NOTE — Assessment & Plan Note (Signed)
She is not consistent with her B12 injection I reminded her to take her B12 injection on the first day of the month consistently That should improve her pancytopenia

## 2021-11-04 NOTE — Assessment & Plan Note (Signed)
Her last PET CT showed excellent response to treatment PET CT scan is considered to be normal She will continue maintenance pembrolizumab with Lenvima indefinitely I plan to repeat imaging study again at the end of the year  

## 2021-11-07 ENCOUNTER — Other Ambulatory Visit (HOSPITAL_COMMUNITY): Payer: Self-pay

## 2021-11-08 ENCOUNTER — Other Ambulatory Visit (HOSPITAL_COMMUNITY): Payer: Self-pay

## 2021-11-18 ENCOUNTER — Other Ambulatory Visit (HOSPITAL_COMMUNITY): Payer: Self-pay

## 2021-11-21 ENCOUNTER — Other Ambulatory Visit (HOSPITAL_COMMUNITY): Payer: Self-pay

## 2021-11-22 DIAGNOSIS — M25561 Pain in right knee: Secondary | ICD-10-CM | POA: Diagnosis not present

## 2021-11-24 ENCOUNTER — Other Ambulatory Visit (HOSPITAL_COMMUNITY): Payer: Self-pay

## 2021-11-25 ENCOUNTER — Encounter: Payer: Self-pay | Admitting: Hematology and Oncology

## 2021-11-25 ENCOUNTER — Inpatient Hospital Stay (HOSPITAL_BASED_OUTPATIENT_CLINIC_OR_DEPARTMENT_OTHER): Payer: 59 | Admitting: Hematology and Oncology

## 2021-11-25 ENCOUNTER — Other Ambulatory Visit: Payer: Self-pay

## 2021-11-25 ENCOUNTER — Inpatient Hospital Stay: Payer: 59

## 2021-11-25 VITALS — BP 103/62 | HR 68 | Resp 18 | Ht 64.0 in | Wt 150.8 lb

## 2021-11-25 DIAGNOSIS — C770 Secondary and unspecified malignant neoplasm of lymph nodes of head, face and neck: Secondary | ICD-10-CM

## 2021-11-25 DIAGNOSIS — C541 Malignant neoplasm of endometrium: Secondary | ICD-10-CM | POA: Diagnosis not present

## 2021-11-25 DIAGNOSIS — Z95828 Presence of other vascular implants and grafts: Secondary | ICD-10-CM

## 2021-11-25 DIAGNOSIS — E039 Hypothyroidism, unspecified: Secondary | ICD-10-CM | POA: Diagnosis not present

## 2021-11-25 DIAGNOSIS — E559 Vitamin D deficiency, unspecified: Secondary | ICD-10-CM

## 2021-11-25 DIAGNOSIS — D61818 Other pancytopenia: Secondary | ICD-10-CM

## 2021-11-25 DIAGNOSIS — I10 Essential (primary) hypertension: Secondary | ICD-10-CM

## 2021-11-25 DIAGNOSIS — Z79899 Other long term (current) drug therapy: Secondary | ICD-10-CM | POA: Diagnosis not present

## 2021-11-25 DIAGNOSIS — Z7189 Other specified counseling: Secondary | ICD-10-CM

## 2021-11-25 DIAGNOSIS — Z5112 Encounter for antineoplastic immunotherapy: Secondary | ICD-10-CM | POA: Diagnosis not present

## 2021-11-25 LAB — CBC WITH DIFFERENTIAL/PLATELET
Abs Immature Granulocytes: 0.01 10*3/uL (ref 0.00–0.07)
Basophils Absolute: 0 10*3/uL (ref 0.0–0.1)
Basophils Relative: 1 %
Eosinophils Absolute: 0.2 10*3/uL (ref 0.0–0.5)
Eosinophils Relative: 6 %
HCT: 32.1 % — ABNORMAL LOW (ref 36.0–46.0)
Hemoglobin: 10.7 g/dL — ABNORMAL LOW (ref 12.0–15.0)
Immature Granulocytes: 0 %
Lymphocytes Relative: 21 %
Lymphs Abs: 0.7 10*3/uL (ref 0.7–4.0)
MCH: 31.7 pg (ref 26.0–34.0)
MCHC: 33.3 g/dL (ref 30.0–36.0)
MCV: 95 fL (ref 80.0–100.0)
Monocytes Absolute: 0.2 10*3/uL (ref 0.1–1.0)
Monocytes Relative: 7 %
Neutro Abs: 2.1 10*3/uL (ref 1.7–7.7)
Neutrophils Relative %: 65 %
Platelets: 201 10*3/uL (ref 150–400)
RBC: 3.38 MIL/uL — ABNORMAL LOW (ref 3.87–5.11)
RDW: 13.6 % (ref 11.5–15.5)
WBC: 3.3 10*3/uL — ABNORMAL LOW (ref 4.0–10.5)
nRBC: 0 % (ref 0.0–0.2)

## 2021-11-25 LAB — COMPREHENSIVE METABOLIC PANEL
ALT: 18 U/L (ref 0–44)
AST: 22 U/L (ref 15–41)
Albumin: 4 g/dL (ref 3.5–5.0)
Alkaline Phosphatase: 47 U/L (ref 38–126)
Anion gap: 6 (ref 5–15)
BUN: 21 mg/dL (ref 8–23)
CO2: 31 mmol/L (ref 22–32)
Calcium: 9.7 mg/dL (ref 8.9–10.3)
Chloride: 101 mmol/L (ref 98–111)
Creatinine, Ser: 0.82 mg/dL (ref 0.44–1.00)
GFR, Estimated: >60 mL/min (ref >60)
Glucose, Bld: 87 mg/dL (ref 70–99)
Potassium: 4.2 mmol/L (ref 3.5–5.1)
Sodium: 138 mmol/L (ref 135–145)
Total Bilirubin: 0.5 mg/dL (ref 0.3–1.2)
Total Protein: 6.8 g/dL (ref 6.5–8.1)

## 2021-11-25 LAB — TSH: TSH: 1.969 u[IU]/mL (ref 0.350–4.500)

## 2021-11-25 LAB — TOTAL PROTEIN, URINE DIPSTICK: Protein, ur: NEGATIVE mg/dL

## 2021-11-25 LAB — VITAMIN D 25 HYDROXY (VIT D DEFICIENCY, FRACTURES): Vit D, 25-Hydroxy: 27.67 ng/mL — ABNORMAL LOW (ref 30–100)

## 2021-11-25 MED ORDER — HEPARIN SOD (PORK) LOCK FLUSH 100 UNIT/ML IV SOLN
500.0000 [IU] | Freq: Once | INTRAVENOUS | Status: AC | PRN
  Administered 2021-11-25: 500 [IU]

## 2021-11-25 MED ORDER — SODIUM CHLORIDE 0.9% FLUSH
10.0000 mL | INTRAVENOUS | Status: DC | PRN
  Administered 2021-11-25: 10 mL

## 2021-11-25 MED ORDER — SODIUM CHLORIDE 0.9 % IV SOLN: Freq: Once | INTRAVENOUS | Status: AC

## 2021-11-25 MED ORDER — SODIUM CHLORIDE 0.9 % IV SOLN
200.0000 mg | Freq: Once | INTRAVENOUS | Status: AC
  Administered 2021-11-25: 200 mg via INTRAVENOUS
  Filled 2021-11-25: qty 200

## 2021-11-25 MED ORDER — SODIUM CHLORIDE 0.9% FLUSH
10.0000 mL | Freq: Once | INTRAVENOUS | Status: AC
  Administered 2021-11-25: 10 mL

## 2021-11-28 ENCOUNTER — Telehealth: Payer: Self-pay

## 2021-11-28 NOTE — Telephone Encounter (Signed)
Called and given vitamin D results. She verbalized understanding and will contact PCP.

## 2021-11-29 ENCOUNTER — Other Ambulatory Visit: Payer: Self-pay | Admitting: Family Medicine

## 2021-11-29 DIAGNOSIS — Z1231 Encounter for screening mammogram for malignant neoplasm of breast: Secondary | ICD-10-CM

## 2021-11-30 ENCOUNTER — Other Ambulatory Visit: Payer: Self-pay | Admitting: Hematology and Oncology

## 2021-11-30 ENCOUNTER — Other Ambulatory Visit (HOSPITAL_COMMUNITY): Payer: Self-pay

## 2021-12-01 ENCOUNTER — Encounter: Payer: Self-pay | Admitting: Hematology and Oncology

## 2021-12-01 ENCOUNTER — Other Ambulatory Visit (HOSPITAL_COMMUNITY): Payer: Self-pay

## 2021-12-01 ENCOUNTER — Ambulatory Visit
Admission: RE | Admit: 2021-12-01 | Discharge: 2021-12-01 | Disposition: A | Discharge status: Home / Self Care | Payer: 59 | Source: Ambulatory Visit | Attending: Family Medicine | Admitting: Family Medicine

## 2021-12-01 DIAGNOSIS — Z1231 Encounter for screening mammogram for malignant neoplasm of breast: Secondary | ICD-10-CM

## 2021-12-01 DIAGNOSIS — M25561 Pain in right knee: Secondary | ICD-10-CM | POA: Diagnosis not present

## 2021-12-01 MED ORDER — SYRINGE/NEEDLE (DISP) 25G X 5/8" 3 ML MISC
0 refills | Status: DC
Start: 2021-12-01 — End: 2024-03-17
  Filled 2021-12-01: qty 12, 365d supply, fill #0

## 2021-12-01 MED ORDER — CYANOCOBALAMIN 1000 MCG/ML IJ SOLN
1000.0000 ug | INTRAMUSCULAR | 11 refills | Status: DC
  Filled 2021-12-01: qty 3, 90d supply, fill #0
  Filled 2022-06-02: qty 3, 90d supply, fill #1
  Filled 2022-10-13: qty 3, 90d supply, fill #2

## 2021-12-02 ENCOUNTER — Other Ambulatory Visit (HOSPITAL_COMMUNITY): Payer: Self-pay

## 2021-12-07 ENCOUNTER — Other Ambulatory Visit (HOSPITAL_COMMUNITY): Payer: Self-pay

## 2021-12-07 DIAGNOSIS — M25561 Pain in right knee: Secondary | ICD-10-CM | POA: Diagnosis not present

## 2021-12-07 MED ORDER — SODIUM FLUORIDE 1.1 % DT CREA
TOPICAL_CREAM | DENTAL | 12 refills | Status: DC
  Filled 2021-12-07: qty 51, 30d supply, fill #0

## 2021-12-08 ENCOUNTER — Other Ambulatory Visit (HOSPITAL_COMMUNITY): Payer: Self-pay

## 2021-12-09 ENCOUNTER — Other Ambulatory Visit (HOSPITAL_COMMUNITY): Payer: Self-pay

## 2021-12-12 ENCOUNTER — Other Ambulatory Visit (HOSPITAL_COMMUNITY): Payer: Self-pay

## 2021-12-14 ENCOUNTER — Other Ambulatory Visit (HOSPITAL_COMMUNITY): Payer: Self-pay

## 2021-12-15 ENCOUNTER — Inpatient Hospital Stay: Payer: 59 | Admitting: Hematology and Oncology

## 2021-12-15 ENCOUNTER — Inpatient Hospital Stay: Payer: 59 | Attending: Hematology and Oncology

## 2021-12-15 ENCOUNTER — Inpatient Hospital Stay: Payer: 59

## 2021-12-15 ENCOUNTER — Other Ambulatory Visit: Payer: Self-pay

## 2021-12-15 VITALS — BP 126/62 | HR 73 | Temp 98.1°F | Resp 18 | Ht 64.0 in | Wt 150.8 lb

## 2021-12-15 DIAGNOSIS — C541 Malignant neoplasm of endometrium: Secondary | ICD-10-CM | POA: Diagnosis not present

## 2021-12-15 DIAGNOSIS — Z79899 Other long term (current) drug therapy: Secondary | ICD-10-CM | POA: Diagnosis not present

## 2021-12-15 DIAGNOSIS — Z5112 Encounter for antineoplastic immunotherapy: Secondary | ICD-10-CM | POA: Diagnosis not present

## 2021-12-15 DIAGNOSIS — D61818 Other pancytopenia: Secondary | ICD-10-CM | POA: Diagnosis not present

## 2021-12-15 DIAGNOSIS — C770 Secondary and unspecified malignant neoplasm of lymph nodes of head, face and neck: Secondary | ICD-10-CM

## 2021-12-15 DIAGNOSIS — E039 Hypothyroidism, unspecified: Secondary | ICD-10-CM

## 2021-12-15 DIAGNOSIS — I10 Essential (primary) hypertension: Secondary | ICD-10-CM

## 2021-12-15 DIAGNOSIS — Z7189 Other specified counseling: Secondary | ICD-10-CM

## 2021-12-15 DIAGNOSIS — M25561 Pain in right knee: Secondary | ICD-10-CM | POA: Diagnosis not present

## 2021-12-15 LAB — CBC WITH DIFFERENTIAL/PLATELET
Abs Immature Granulocytes: 0.05 10*3/uL (ref 0.00–0.07)
Basophils Absolute: 0 10*3/uL (ref 0.0–0.1)
Basophils Relative: 1 %
Eosinophils Absolute: 0.3 10*3/uL (ref 0.0–0.5)
Eosinophils Relative: 6 %
HCT: 33.4 % — ABNORMAL LOW (ref 36.0–46.0)
Hemoglobin: 11.1 g/dL — ABNORMAL LOW (ref 12.0–15.0)
Immature Granulocytes: 1 %
Lymphocytes Relative: 21 %
Lymphs Abs: 1 10*3/uL (ref 0.7–4.0)
MCH: 31.9 pg (ref 26.0–34.0)
MCHC: 33.2 g/dL (ref 30.0–36.0)
MCV: 96 fL (ref 80.0–100.0)
Monocytes Absolute: 0.4 10*3/uL (ref 0.1–1.0)
Monocytes Relative: 8 %
Neutro Abs: 2.9 10*3/uL (ref 1.7–7.7)
Neutrophils Relative %: 63 %
Platelets: 178 10*3/uL (ref 150–400)
RBC: 3.48 MIL/uL — ABNORMAL LOW (ref 3.87–5.11)
RDW: 13.8 % (ref 11.5–15.5)
WBC: 4.6 10*3/uL (ref 4.0–10.5)
nRBC: 0 % (ref 0.0–0.2)

## 2021-12-15 LAB — COMPREHENSIVE METABOLIC PANEL
ALT: 17 U/L (ref 0–44)
AST: 22 U/L (ref 15–41)
Albumin: 4.1 g/dL (ref 3.5–5.0)
Alkaline Phosphatase: 47 U/L (ref 38–126)
Anion gap: 8 (ref 5–15)
BUN: 24 mg/dL — ABNORMAL HIGH (ref 8–23)
CO2: 27 mmol/L (ref 22–32)
Calcium: 9.8 mg/dL (ref 8.9–10.3)
Chloride: 101 mmol/L (ref 98–111)
Creatinine, Ser: 0.88 mg/dL (ref 0.44–1.00)
GFR, Estimated: >60 mL/min (ref >60)
Glucose, Bld: 99 mg/dL (ref 70–99)
Potassium: 4.2 mmol/L (ref 3.5–5.1)
Sodium: 136 mmol/L (ref 135–145)
Total Bilirubin: 0.4 mg/dL (ref 0.3–1.2)
Total Protein: 7 g/dL (ref 6.5–8.1)

## 2021-12-15 LAB — TSH: TSH: 2.82 u[IU]/mL (ref 0.350–4.500)

## 2021-12-15 MED ORDER — HEPARIN SOD (PORK) LOCK FLUSH 100 UNIT/ML IV SOLN
500.0000 [IU] | Freq: Once | INTRAVENOUS | Status: AC | PRN
  Administered 2021-12-15: 500 [IU]

## 2021-12-15 MED ORDER — SODIUM CHLORIDE 0.9% FLUSH
10.0000 mL | INTRAVENOUS | Status: DC | PRN
  Administered 2021-12-15: 10 mL

## 2021-12-15 MED ORDER — SODIUM CHLORIDE 0.9 % IV SOLN
200.0000 mg | Freq: Once | INTRAVENOUS | Status: AC
  Administered 2021-12-15: 200 mg via INTRAVENOUS
  Filled 2021-12-15: qty 200

## 2021-12-15 MED ORDER — SODIUM CHLORIDE 0.9 % IV SOLN: Freq: Once | INTRAVENOUS | Status: AC

## 2021-12-20 DIAGNOSIS — M25561 Pain in right knee: Secondary | ICD-10-CM | POA: Diagnosis not present

## 2021-12-23 ENCOUNTER — Other Ambulatory Visit (HOSPITAL_COMMUNITY): Payer: Self-pay

## 2021-12-26 ENCOUNTER — Other Ambulatory Visit: Payer: Self-pay

## 2021-12-29 DIAGNOSIS — M25561 Pain in right knee: Secondary | ICD-10-CM | POA: Diagnosis not present

## 2021-12-30 ENCOUNTER — Telehealth: Payer: Self-pay

## 2021-12-30 DIAGNOSIS — H524 Presbyopia: Secondary | ICD-10-CM | POA: Diagnosis not present

## 2021-12-30 DIAGNOSIS — H5203 Hypermetropia, bilateral: Secondary | ICD-10-CM | POA: Diagnosis not present

## 2021-12-30 NOTE — Telephone Encounter (Signed)
Returned her call. She saw ophthalmologist today regarding cataract surgery. First eye surgery is planned 8/16. She has been off the [facility] for 1 week. The ophthalmologist offered to do the 2nd eye 1 week after the first eye. She is asking if this okay with Dr. Alvy Bimler to have the eye surgery 1 week apart?

## 2022-01-02 ENCOUNTER — Encounter: Payer: Self-pay | Admitting: Hematology and Oncology

## 2022-01-02 DIAGNOSIS — M25561 Pain in right knee: Secondary | ICD-10-CM | POA: Diagnosis not present

## 2022-01-02 NOTE — Telephone Encounter (Signed)
Typically cataract surgery is relatively bloodless I am ok with the plan

## 2022-01-02 NOTE — Telephone Encounter (Signed)
Called and left below message. Ask her to call the office back for questions. 

## 2022-01-09 ENCOUNTER — Other Ambulatory Visit (HOSPITAL_COMMUNITY): Payer: Self-pay

## 2022-01-10 ENCOUNTER — Other Ambulatory Visit (HOSPITAL_COMMUNITY): Payer: Self-pay

## 2022-01-11 ENCOUNTER — Other Ambulatory Visit (HOSPITAL_COMMUNITY): Payer: Self-pay

## 2022-01-12 ENCOUNTER — Other Ambulatory Visit (HOSPITAL_COMMUNITY): Payer: Self-pay

## 2022-01-12 ENCOUNTER — Inpatient Hospital Stay: Payer: 59

## 2022-01-12 ENCOUNTER — Inpatient Hospital Stay: Payer: 59 | Attending: Hematology and Oncology

## 2022-01-12 ENCOUNTER — Other Ambulatory Visit: Payer: Self-pay

## 2022-01-12 ENCOUNTER — Inpatient Hospital Stay (HOSPITAL_BASED_OUTPATIENT_CLINIC_OR_DEPARTMENT_OTHER): Payer: 59 | Admitting: Hematology and Oncology

## 2022-01-12 ENCOUNTER — Encounter: Payer: Self-pay | Admitting: Hematology and Oncology

## 2022-01-12 VITALS — Temp 98.4°F

## 2022-01-12 DIAGNOSIS — Z79899 Other long term (current) drug therapy: Secondary | ICD-10-CM | POA: Insufficient documentation

## 2022-01-12 DIAGNOSIS — Z5112 Encounter for antineoplastic immunotherapy: Secondary | ICD-10-CM | POA: Diagnosis not present

## 2022-01-12 DIAGNOSIS — D539 Nutritional anemia, unspecified: Secondary | ICD-10-CM

## 2022-01-12 DIAGNOSIS — C770 Secondary and unspecified malignant neoplasm of lymph nodes of head, face and neck: Secondary | ICD-10-CM | POA: Insufficient documentation

## 2022-01-12 DIAGNOSIS — Z95828 Presence of other vascular implants and grafts: Secondary | ICD-10-CM

## 2022-01-12 DIAGNOSIS — R600 Localized edema: Secondary | ICD-10-CM | POA: Insufficient documentation

## 2022-01-12 DIAGNOSIS — I10 Essential (primary) hypertension: Secondary | ICD-10-CM

## 2022-01-12 DIAGNOSIS — C541 Malignant neoplasm of endometrium: Secondary | ICD-10-CM

## 2022-01-12 DIAGNOSIS — Z7189 Other specified counseling: Secondary | ICD-10-CM

## 2022-01-12 DIAGNOSIS — H269 Unspecified cataract: Secondary | ICD-10-CM | POA: Diagnosis not present

## 2022-01-12 DIAGNOSIS — E039 Hypothyroidism, unspecified: Secondary | ICD-10-CM

## 2022-01-12 LAB — CBC WITH DIFFERENTIAL/PLATELET
Abs Immature Granulocytes: 0.01 10*3/uL (ref 0.00–0.07)
Basophils Absolute: 0 10*3/uL (ref 0.0–0.1)
Basophils Relative: 1 %
Eosinophils Absolute: 0.2 10*3/uL (ref 0.0–0.5)
Eosinophils Relative: 6 %
HCT: 28.3 % — ABNORMAL LOW (ref 36.0–46.0)
Hemoglobin: 9.2 g/dL — ABNORMAL LOW (ref 12.0–15.0)
Immature Granulocytes: 0 %
Lymphocytes Relative: 15 %
Lymphs Abs: 0.6 10*3/uL — ABNORMAL LOW (ref 0.7–4.0)
MCH: 31.6 pg (ref 26.0–34.0)
MCHC: 32.5 g/dL (ref 30.0–36.0)
MCV: 97.3 fL (ref 80.0–100.0)
Monocytes Absolute: 0.4 10*3/uL (ref 0.1–1.0)
Monocytes Relative: 9 %
Neutro Abs: 2.8 10*3/uL (ref 1.7–7.7)
Neutrophils Relative %: 69 %
Platelets: 174 10*3/uL (ref 150–400)
RBC: 2.91 MIL/uL — ABNORMAL LOW (ref 3.87–5.11)
RDW: 14.3 % (ref 11.5–15.5)
WBC: 4 10*3/uL (ref 4.0–10.5)
nRBC: 0 % (ref 0.0–0.2)

## 2022-01-12 LAB — COMPREHENSIVE METABOLIC PANEL WITH GFR
ALT: 32 U/L (ref 0–44)
AST: 35 U/L (ref 15–41)
Albumin: 3.8 g/dL (ref 3.5–5.0)
Alkaline Phosphatase: 40 U/L (ref 38–126)
Anion gap: 5 (ref 5–15)
BUN: 22 mg/dL (ref 8–23)
CO2: 30 mmol/L (ref 22–32)
Calcium: 9.3 mg/dL (ref 8.9–10.3)
Chloride: 102 mmol/L (ref 98–111)
Creatinine, Ser: 0.89 mg/dL (ref 0.44–1.00)
GFR, Estimated: >60 mL/min
Glucose, Bld: 101 mg/dL — ABNORMAL HIGH (ref 70–99)
Potassium: 4 mmol/L (ref 3.5–5.1)
Sodium: 137 mmol/L (ref 135–145)
Total Bilirubin: 0.5 mg/dL (ref 0.3–1.2)
Total Protein: 6.6 g/dL (ref 6.5–8.1)

## 2022-01-12 LAB — TSH: TSH: 0.678 u[IU]/mL (ref 0.350–4.500)

## 2022-01-12 LAB — TOTAL PROTEIN, URINE DIPSTICK: Protein, ur: NEGATIVE mg/dL

## 2022-01-12 MED ORDER — SODIUM CHLORIDE 0.9 % IV SOLN
200.0000 mg | Freq: Once | INTRAVENOUS | Status: AC
  Administered 2022-01-12: 200 mg via INTRAVENOUS
  Filled 2022-01-12: qty 200

## 2022-01-12 MED ORDER — SODIUM CHLORIDE 0.9 % IV SOLN: Freq: Once | INTRAVENOUS | Status: AC

## 2022-01-12 MED ORDER — FUROSEMIDE 20 MG PO TABS
20.0000 mg | ORAL_TABLET | Freq: Every day | ORAL | 0 refills | Status: DC
  Filled 2022-01-12: qty 30, 30d supply, fill #0

## 2022-01-12 MED ORDER — SODIUM CHLORIDE 0.9% FLUSH
10.0000 mL | Freq: Once | INTRAVENOUS | Status: AC
  Administered 2022-01-12: 10 mL

## 2022-01-12 MED ORDER — HEPARIN SOD (PORK) LOCK FLUSH 100 UNIT/ML IV SOLN
500.0000 [IU] | Freq: Once | INTRAVENOUS | Status: AC | PRN
  Administered 2022-01-12: 500 [IU]

## 2022-01-12 MED ORDER — SODIUM CHLORIDE 0.9% FLUSH
10.0000 mL | INTRAVENOUS | Status: DC | PRN
  Administered 2022-01-12: 10 mL

## 2022-01-12 NOTE — Assessment & Plan Note (Signed)
Her last PET CT showed excellent response to treatment PET CT scan is considered to be normal She will continue maintenance pembrolizumab with Lenvima indefinitely Due to upcoming cataract surgery, Lenvima is placed on hold We will proceed with pembrolizumab only today I plan to repeat imaging study again at the end of the year

## 2022-01-12 NOTE — Patient Instructions (Signed)
[facility]  Discharge Instructions: Thank you for choosing [facility] to provide your oncology and hematology care.   If you have a lab appointment with the Cancer Center, please go directly to the Cancer Center and check in at the registration area.   Wear comfortable clothing and clothing appropriate for easy access to any Portacath or PICC line.   We strive to give you quality time with your provider. You may need to reschedule your appointment if you arrive late (15 or more minutes).  Arriving late affects you and other patients whose appointments are after yours.  Also, if you miss three or more appointments without notifying the office, you may be dismissed from the clinic at the provider's discretion.      For prescription refill requests, have your pharmacy contact our office and allow 72 hours for refills to be completed.    Today you received the following chemotherapy and/or immunotherapy agents: Keytruda.       To help prevent nausea and vomiting after your treatment, we encourage you to take your nausea medication as directed.  BELOW ARE SYMPTOMS THAT SHOULD BE REPORTED IMMEDIATELY: *FEVER GREATER THAN 100.4 F (38 C) OR HIGHER *CHILLS OR SWEATING *NAUSEA AND VOMITING THAT IS NOT CONTROLLED WITH YOUR NAUSEA MEDICATION *UNUSUAL SHORTNESS OF BREATH *UNUSUAL BRUISING OR BLEEDING *URINARY PROBLEMS (pain or burning when urinating, or frequent urination) *BOWEL PROBLEMS (unusual diarrhea, constipation, pain near the anus) TENDERNESS IN MOUTH AND THROAT WITH OR WITHOUT PRESENCE OF ULCERS (sore throat, sores in mouth, or a toothache) UNUSUAL RASH, SWELLING OR PAIN  UNUSUAL VAGINAL DISCHARGE OR ITCHING   Items with * indicate a potential emergency and should be followed up as soon as possible or go to the Emergency Department if any problems should occur.  Please show the CHEMOTHERAPY ALERT CARD or IMMUNOTHERAPY ALERT CARD at check-in to  the Emergency Department and triage nurse.  Should you have questions after your visit or need to cancel or reschedule your appointment, please contact [facility]  Dept: [phone]  and follow the prompts.  Office hours are 8:00 a.m. to 4:30 p.m. Monday - Friday. Please note that voicemails left after 4:00 p.m. may not be returned until the following business day.  We are closed weekends and major holidays. You have access to a nurse at all times for urgent questions. Please call the main number to the clinic Dept: [phone] and follow the prompts.   For any non-urgent questions, you may also contact your provider using MyChart. We now offer e-Visits for anyone 18 and older to request care online for non-urgent symptoms. For details visit [email or web address].   Also download the MyChart app! Go to the app store, search "MyChart", open the app, select [facility], and log in with your MyChart username and password.  Masks are optional in the cancer centers. If you would like for your care team to wear a mask while they are taking care of you, please let them know. You may have one support person who is at least 68 years old accompany you for your appointments. 

## 2022-01-12 NOTE — Progress Notes (Signed)
Diane Lozano  Patient Care Team: [name], MD as PCP - General (Family Medicine) Reynold Bowen, MD as Consulting Physician (Endocrinology)  ASSESSMENT & PLAN:  Endometrial cancer [facility]) Her last PET CT showed excellent response to treatment PET CT scan is considered to be normal Diane Lozano will continue maintenance pembrolizumab with Lenvima indefinitely Due to upcoming cataract surgery, Lenvima is placed on hold We will proceed with pembrolizumab only today I plan to repeat imaging study again at the end of the year   Cataracts, bilateral Diane Lozano desired to undergo cataract surgery This is planned for next week Diane Lozano has held her Lenvima for approximately 1 month I recommend Diane Lozano proceed as scheduled If Diane Lozano has no complications after her second eye surgery, Diane Lozano can resume Lenvima as early as 3 weeks after surgery  Bilateral leg edema Diane Lozano has significant bilateral lower extremity edema secondary to fluid retention and recent travel, missing her hydrochlorothiazide dose I recommend switching out hydrochlorothiazide and for her to start taking furosemide Recommend the patient to weigh herself daily The goal will be to get her back to approximately 151 pounds If furosemide is not helpful, Diane Lozano is instructed to call me  Deficiency anemia Diane Lozano has chronic anemia due to anemia chronic illness Observe closely  No orders of the defined types were placed in this encounter.   All questions were answered. The patient knows to call the clinic with any problems, questions or concerns. The total time spent in the appointment was 30 minutes encounter with patients including review of chart and various tests results, discussions about plan of care and coordination of care plan   [name], MD 01/12/2022 10:41 AM  INTERVAL HISTORY: Please see below for problem oriented charting. Diane Lozano returns for treatment follow-up on treatment with pembrolizumab and lenvatinib Diane Lozano  recently traveled to [facility] and missed taking her hydrochlorothiazide Diane Lozano started to have significant bilateral lower extremity edema after Diane Lozano walked a lot during her trip Diane Lozano has gained 10 pounds since last time I saw her Diane Lozano denies chest pain or shortness of breath Diane Lozano desired to undergo cataract surgery soon Her ophthalmologist is willing to perform 1 eye at a time, a week apart  REVIEW OF SYSTEMS:   Constitutional: Denies fevers, chills or abnormal weight loss Ears, nose, mouth, throat, and face: Denies mucositis or sore throat Respiratory: Denies cough, dyspnea or wheezes Cardiovascular: Denies palpitation, chest discomfort Gastrointestinal:  Denies nausea, heartburn or change in bowel habits Skin: Denies abnormal skin rashes Lymphatics: Denies new lymphadenopathy or easy bruising Neurological:Denies numbness, tingling or new weaknesses Behavioral/Psych: Mood is stable, no new changes  All other systems were reviewed with the patient and are negative.  I have reviewed the past medical history, past surgical history, social history and family history with the patient and they are unchanged from previous Lozano.  ALLERGIES:  is allergic to lenvima [lenvatinib].  MEDICATIONS:  Current Outpatient Medications  Medication Sig Dispense Refill   furosemide (LASIX) 20 MG tablet Take 1 tablet (20 mg total) by mouth daily. 30 tablet 0   amLODipine (NORVASC) 10 MG tablet TAKE 1 TABLET BY MOUTH ONCE DAILY. **DECREASE SIMVASTATIN TO 20 MG DAILY WHILE ON AMLODIPINE PER MD** 90 tablet 1   busPIRone (BUSPAR) 10 MG tablet Take 1 tablet by mouth 2 times a day 180 tablet 3   Calcium Carb-Cholecalciferol 500-600 MG-UNIT TABS Take 1 tablet by mouth daily.      Cholecalciferol (VITAMIN D3) 2000 units TABS Take 2,000 Units  by mouth daily.      cyanocobalamin (DODEX) 1000 MCG/ML injection Inject 1 mL into the muscle every 30 days. 1 mL 11   hydrochlorothiazide (MICROZIDE) 12.5 MG capsule Take 1 capsule  (12.5 mg total) by mouth daily. 90 capsule 3   lenvatinib 10 mg daily dose (LENVIMA, 10 MG DAILY DOSE,) capsule TAKE 1 CAPSULE (10 MG TOTAL) BY MOUTH DAILY. 30 each 11   levothyroxine (SYNTHROID) 112 MCG tablet Take 1 tablet by mouth daily on an empty stomach 30 mins before a meal 90 tablet 3   lidocaine-prilocaine (EMLA) cream APPLY TO AFFECTED AREA ONCE AS DIRECTED 30 g 3   losartan (COZAAR) 25 MG tablet TAKE 1 TABLET BY MOUTH DAILY 90 tablet 3   metoprolol tartrate (LOPRESSOR) 25 MG tablet TAKE 1 TABLET BY MOUTH TWICE DAILY 180 tablet 3   ondansetron (ZOFRAN) 8 MG tablet Take 1 tablet (8 mg total) by mouth every 8 (eight) hours as needed for nausea. 30 tablet 3   pantoprazole (PROTONIX) 40 MG tablet TAKE 1 TABLET BY MOUTH ONCE DAILY 90 tablet 3   simvastatin (ZOCOR) 20 MG tablet Take 1 tablet by mouth daily in the evening. 90 tablet 3   sodium fluoride (PREVIDENT 5000 PLUS) 1.1 % CREA dental cream Use as directed 51 g 12   SYRINGE-NEEDLE, DISP, 3 ML 25G X 5/8" 3 ML MISC Use as directed to administer B12 shot every 30 days. 12 each 0   tacrolimus (PROTOPIC) 0.1 % ointment Apply 1 application topically daily as needed (rash).      venlafaxine XR (EFFEXOR XR) 150 MG 24 hr capsule Take 2 capsules by mouth in the morning with food 180 capsule 3   No current facility-administered medications for this visit.    SUMMARY OF ONCOLOGIC HISTORY: Oncology History Overview Lozano  [employer] testing done 11-2015 on surgical path from 2014:    MS stable   TMB low  4 muts/mb   ATM [id number]   ERBB3 T389K   E2H2 rearrangement exon 9   PPP2R1A P179R   TP53 I195N    ER- APPROXIMATELY 25-35% STAINING IN NEOPLASTIC CELLS (INTERMEDIATE)  PR- APPROXIMATELY 25-35% STAINING IN NEOPLASTIC CELLS (STRONG)  Repeat biopsy 04/02/17: ER negative, Her 2 negative  Progressed on Doxil   Endometrial cancer ([place])  06/20/2012 Pathology Results   Biopsy positive for papillary serous carcinoma   06/20/2012 Genetic  Testing   [employer] testing done 11-2015 on surgical path from 2014:    MS stable   TMB low  4 muts/mb   ATM [id number]   ERBB3 T389K   E2H2 rearrangement exon 9   PPP2R1A P179R   TP53 I195N    06/20/2012 Initial Diagnosis   Patient presented to PCP with intermittent vaginal bleeding since ~ Oct 2013, endometrial biopsy 06-20-12 with complex endometrial hyperplasia with atypia   06/26/2012 Imaging   Thickened endometrial lining in a postmenopausal patient experiencing vaginal bleeding. In the setting of post-menopausal bleeding, endometrial sampling is indicated to exclude carcinoma. No focal myometrial abnormalities are seen.  Normal left ovary and non-visualized right ovary   07/30/2012 Surgery   Dr. Polly Cobia performed robotic hysterectomy with bilateral salpingo-oophorectomy, bilateral pelvic lymph node dissection and periaortic lymph node dissection. Intraoperatively on frozen section, the patient was noted to have a large endometrial polyp with changes within the polyp consistent for high-grade malignancy, possibly papillary serous carcinoma. There is no obvious extrauterine disease noted.     07/30/2012 Pathology Results   [phone] SUPPLEMENTAL REPORT  THE ENDOMETRIAL CARCINOMA WAS ANALYZED FOR DNA MISMATCH REPAIR PROTEINS.  IMMUNOHISTOCHEMICALLY, THE NEOPLASM RETAINED NUCLEAR EXPRESSION OF 4 GENE PRODUCTS, MLH1, MSH2, MSH6, AND PMS2, INVOLVED IN DNA MISMATCH REPAIR.  POSITIVE AND NEGATIVE CONTROLS WORKED APPROPRIATELY.  PER REQUEST, AN ER AND PR ARE PERFORMED ON BLOCK 1I.  ER- APPROXIMATELY 25-35% STAINING IN NEOPLASTIC CELLS (INTERMEDIATE)  PR- APPROXIMATELY 25-35% STAINING IN NEOPLASTIC CELLS (STRONG)  ER AND PR PREDOMINANTLY SHOW STAINING IN THE SEROUS COMPONENT.  DIAGNOSIS  1. UTERUS, CERVIX WITH BILATERAL FALLOPIAN TUBES AND OVARIES,  HYSTERECTOMY AND BILATERAL SALPINGO-OOPHORECTOMY:  HIGH GRADE/POORLY DIFFERENTIATED ENDOMETRIAL ADENOCARCINOMA WITH SOLID AND SEROUS  PAPILLARY COMPONENTS.  HISTOPATHOLOGIC TYPE:    A VARIETY OF PATTERNS WERE PRESENT, ALL OF WHICH SHOULD BE CONSIDERED TO BE HIGH GRADE.  SEROUS PAPILLARY CARCINOMA WAS SEEN OCCURRING ADJACENT TO SOLID ENDOMETRIAL CARCINOMA WITH MARKED ANAPLASIA AND GIANT CELLS. SIZE: TUMOR MEASURED AT LEAST 4.8 CM IN GREATEST HORIZONTAL DIMENSION.  GRADE: POORLY DIFFERENTIATED OR HIGH GRADE. DEPTH OF INVASION: NO DEFINITE MYOMETRIAL INVOLVEMENT WAS SEEN.  THE TUMOR APPEARED CONFINED TO THE POLYP AS WELL AS SURFACE ENDOMETRIUM. WHERE MYOMETRIAL THICKNESS NOT APPLICABLE. SEROSAL INVOLVEMENT:       NOT DEMONSTRATED.  ENDOCERVICAL INVOLVEMENT:  NOT DEMONSTRATED. RESECTION MARGINS:         FREE OF INVOLVEMENT. EXTRAUTERINE EXTENSION:    NOT DEMONSTRATED. ANGIOLYMPHATIC INVASION:   NOT DEFINITELY SEEN. TOTAL NODES EXAMINED:      22.    PELVIC NODES EXAMINED:  20.    PELVIC NODES INVOLVED:  0.    PARA-AORTIC NODES       2. EXAMINED:    PARA-AORTIC NODES       0. INVOLVED TNM STAGE:                 T1A N0 MX. AJCC STAGE GROUPING:       IA. FIGO STAGE:                IA.   08/26/2012 Imaging   No CT evidence for intra-abdominal or pelvic metastatic disease. Trace free pelvic fluid, presumably postoperative although the date of surgery is not documented in the electronic medical record.   08/26/2012 - 12/13/2012 Chemotherapy   Diane Lozano received 6 cycles of carbo/taxol    10/29/2012 - 11/27/2012 Radiation Therapy   May 27, June 5,  June 11, June 19, November 27, 2012: Proximal vagina 30 Gy in 5 fractions      01/17/2013 Imaging   1.  No evidence of recurrent or metastatic disease. 2.  No acute abnormality involving the abdomen or pelvis. 3.  Mild diffuse hepatic steatosis. 4.  Very small supraumbilical midline anterior abdominal wall hernia containing fat, unchanged   01/22/2014 Imaging   No evidence for metastatic or recurrent disease. 2. No bowel obstruction.  Normal appendix. 3. Small fat containing hernia, stable in  appearance. 4. Status post hysterectomy and bilateral oophorectomy   02/19/2014 Imaging   No pulmonary lesions are identified. The abnormality on the chest x-ray is due to asymmetric left-sided sternoclavicular joint degenerative disease   10/12/2014 Imaging   New mild retroperitoneal lymphadenopathy in the left paraaortic region and proximal left common iliac chain, consistent with metastatic disease. No other sites of metastatic disease identified within the abdomen or pelvis.       11/27/2014 - 02/11/2015 Chemotherapy   Diane Lozano received 4 cycles of carbo/taxol    03/01/2015 PET scan   Single hypermetabolic small retroperitoneal lymph node along the aorta. 2. No evidence of metastatic  disease otherwise in the abdomen or pelvis. No evidence local recurrence. 3. Intensely hypermetabolic enlarged nodule adjacent to the RIGHT lobe of thyroid gland. This presumably represents the biopsied lesion in clinician report which was found to be benign thyroid tissue.   04/05/2015 - 05/13/2015 Radiation Therapy   Diane Lozano received 50.4 gray in 28 fractions with simultaneous integrated boost to 56 gray   06/17/2015 Imaging   No acute process or evidence of metastatic disease in the abdomen or pelvis. Resolution of previously described retroperitoneal adenopathy. 2.  Possible constipation. 3. Atherosclerosis.   06/17/2015 Tumor Marker   Patient's tumor was tested for the following markers: CA125 Results of the tumor marker test revealed 33   08/12/2015 Tumor Marker   Patient's tumor was tested for the following markers: CA125 Results of the tumor marker test revealed 52   08/31/2015 PET scan   Development of right paratracheal hypermetabolic adenopathy, consistent with nodal metastasis. 2. The previously described isolated abdominal retroperitoneal hypermetabolic node has resolved. 3. Persistent hypermetabolic right thyroid nodule, per report previously biopsied. Correlate with those results. 4.  Possible  constipation.   09/09/2015 -  Anti-estrogen oral therapy   Diane Lozano has been receiving alternative treatment between megace and tamoxifen   09/09/2015 Tumor Marker   Patient's tumor was tested for the following markers: CA125 Results of the tumor marker test revealed 37.8   09/20/2015 Procedure   Technically successful ultrasound-guided thyroid aspiration biopsy , dominant right nodule   09/20/2015 Pathology Results   THYROID, RIGHT, FINE NEEDLE ASPIRATION (SPECIMEN 1 OF 1 COLLECTED 09/20/15): FINDINGS CONSISTENT WITH BENIGN THYROID NODULE (BETHESDA CATEGORY II).   10/28/2015 Tumor Marker   Patient's tumor was tested for the following markers: CA125 Results of the tumor marker test revealed 53.2   11/04/2015 Imaging   Stable benign right thyroid nodule   12/16/2015 Tumor Marker   Patient's tumor was tested for the following markers: CA125 Results of the tumor marker test revealed 38.1   03/22/2016 PET scan   Interval progression of hypermetabolic right paratracheal lymph node consistent with metastatic involvement. 2. Stable hypermetabolic right thyroid nodule. Reportedly this has been biopsied in the past.   03/23/2016 Tumor Marker   Patient's tumor was tested for the following markers: CA125 Results of the tumor marker test revealed 62.4   04/13/2016 - 06/01/2016 Radiation Therapy   Diane Lozano received 56 Gy to the chest in 28 fractions    05/09/2016 Imaging   No evidence of left lower extremity deep vein thrombosis. No evidence of a superficial thrombosis of the greater and lesser saphenous veins. Positive for thrombus noted in several varicosities of the calf. No evidence of Baker's cyst on the left.   05/17/2016 Tumor Marker   Patient's tumor was tested for the following markers: CA125 Results of the tumor marker test revealed 9   06/29/2016 Tumor Marker   Patient's tumor was tested for the following markers: CA125 Results of the tumor marker test revealed 5.9   08/04/2016 Tumor Marker    Patient's tumor was tested for the following markers: CA125 Results of the tumor marker test revealed 6.0   09/12/2016 PET scan   Complete metabolic response to therapy, with resolution of hypermetabolic mediastinal lymphadenopathy since prior exam. No residual or new metastatic disease identified. Stable hypermetabolic right thyroid lobe nodule, which was previously biopsied on 09/20/2015.   03/13/2017 PET scan   1. Solitary focus of recurrent right paratracheal hypermetabolic activity, with a 1.0 cm right lower paratracheal node having a maximum SUV  of 9.0. Appearance compatible with recurrent malignancy. 2. Continued hypermetabolic right thyroid nodule, previously biopsied, presumed benign -correlate with prior biopsy results. 3. Other imaging findings of potential clinical significance: Aortic Atherosclerosis (ICD10-I70.0). Mild cardiomegaly. Prominent stool throughout the colon favors constipation.   04/02/2017 Pathology Results   FINE NEEDLE ASPIRATION, ENDOSCOPIC, (EBUS) 4 R NODE (SPECIMEN 1 OF 2 COLLECTED 04/02/17): MALIGNANT CELLS PRESENT, CONSISTENT WITH CARCINOMA. SEE COMMENT. COMMENT: THE MALIGNANT CELLS ARE POSITIVE FOR P53 AND NEGATIVE FOR ESTROGEN RECEPTOR AND TTF-1. THIS PROFILE IS NON-SPECIFIC, BUT THE P53 POSITIVE STAINING IS SUGGESTIVE OF GYNECOLOGIC PRIMARY.    04/11/2017 Procedure   Successful 8 French right internal jugular vein power port placement with its tip at the SVC/RA junction   04/12/2017 Imaging   Normal LV size with mild LV hypertrophy. EF 55-60%. Normal RV size and systolic function. Aortic valve sclerosis without significant stenosis.   04/18/2017 - 06/14/2017 Chemotherapy   The patient had chemotherapy with Doxil    07/10/2017 Imaging   - Left ventricle: The cavity size was normal. There was mild concentric hypertrophy. Systolic function was normal. The estimated ejection fraction was in the range of 55% to 60%. Wall motion was normal; there were no regional  wall motion abnormalities. Doppler parameters are consistent with abnormal left ventricular relaxation (grade 1 diastolic dysfunction). - Aortic valve: Noncoronary cusp mobility was mildly restricted. - Mitral valve: There was mild regurgitation. - Left atrium: The atrium was mildly dilated. - Atrial septum: No defect or patent foramen ovale was identified.  Impressions:  - Compared to November 2018, global LV longitudinal strain remains normal (has increased)   07/11/2017 PET scan   Increased size and hypermetabolic activity of 3.3 cm right thyroid lobe nodule. Thyroid carcinoma cannot be excluded. Recommend repeat ultrasound guided fine needle aspiration to exclude thyroid carcinoma.  New adjacent hypermetabolic 10 mm right supraclavicular lymph node, suspicious for lymph node metastasis.  Slight increase in size and hypermetabolic activity of solitary right paratracheal lymph node.  No evidence of abdominal or pelvic metastatic disease.   07/18/2017 Procedure   1. Technically successful ultrasound guided fine needle aspiration of indeterminate hypermetabolic right-sided thyroid nodule/mass. 2. Technically successful ultrasound-guided core needle biopsy of hypermetabolic right lower cervical lymph node.   07/18/2017 Pathology Results   THYROID, FINE NEEDLE ASPIRATION RIGHT (SPECIMEN 1 OF 1, COLLECTED ON 07/18/2017): ATYPIA OF UNDETERMINED SIGNIFICANCE OR FOLLICULAR LESION OF UNDETERMINED SIGNIFICANCE (BETHESDA CATEGORY III). SEE COMMENT. COMMENT: THE SPECIMEN CONSISTS OF SMALL AND MEDIUM SIZED GROUPS OF FOLLICULAR EPITHELIAL CELLS WITH MILD CYTOLOGIC ATYPIA INCLUDING NUCLEAR ENLARGEMENT AND HURTHLE CELL CHANGE. SOME GROUPS ARE ARRANGED AS MICROFOLLICLES. THERE IS MINIMAL BACKGROUND COLLOID. BASED ON THESE FEATURES, A FOLLICULAR LESION/NEOPLASM CAN NOT BE ENTIRELY RULED OUT. A SPECIMEN WILL BE SENT FOR AFIRMA TESTING.   07/19/2017 Pathology Results   Lymph node, needle/core biopsy -  METASTATIC PAPILLARY SEROUS CARCINOMA. - SEE COMMENT. Microscopic Comment Dr. Vicente Males has reviewed the case and concurs with this interpretation   10/15/2017 PET scan   No new or progressive disease. No evidence of abdominal or pelvic metastatic disease.  Stable hypermetabolic right thyroid lobe nodule and adjacent right supraclavicular lymph node.  Decreased size and hypermetabolic activity of solitary right paratracheal lymph node.   01/23/2018 PET scan   1. Overall, no significant change from PET-CT of 3 months ago. There is persistent hypermetabolic activity at the right thoracic inlet and within a right paratracheal mediastinal node. The nodes have not significantly changed in size, although the metabolic  activity has minimally increased over this interval. It is uncertain how much of the thoracic inlet metabolic activity is attributed to the thyroid nodule versus adjacent cervical lymph nodes, both previously biopsied. 2. No new hypermetabolic activity within the neck, chest, abdomen or pelvis. No evidence of local recurrence in the pelvis. 3. Stable probable radiation changes in the right lung.   04/16/2018 PET scan   1. Interval increase in metabolic activity of the RIGHT supraclavicular node and RIGHT lower paratracheal mediastinal node with minimal change in size. Findings consistent with persistent and mildly progressed metastatic adenopathy. 2. New hypermetabolic small lymph node in the RIGHT lower paratracheal nodal station adjacent to the previously followed node. 3. No evidence of local recurrence in the pelvis or new disease elsewhere.   07/30/2018 PET scan   1. Mild interval progression of right supraclavicular, mediastinal, and right hilar hypermetabolic metastatic disease. There is a new hypermetabolic lymph node in the subcarinal station on today's study. 2. No hypermetabolic disease in the abdomen or pelvis to suggest recurrent/metastases. 3.  Aortic Atherosclerois  (ICD10-170.0)     08/16/2018 -  Chemotherapy   The patient had Lenvima since 3/6 and pembrolizumab since 3/13 for chemotherapy treatment. Michel Santee was held temporarily due to infection and severe hypertension   10/31/2018 PET scan   Partial response to therapy, with decreased hypermetabolic lymphadenopathy in mediastinum and right hilar region.   No significant change in hypermetabolic lymphadenopathy in right inferior neck.   No new or progressive metastatic disease identified. No evidence of recurrent or metastatic carcinoma in the pelvis or abdomen   02/04/2019 PET scan   1. Decrease in metabolic activity of RIGHT supraclavicular node and RIGHT paratracheal lymph nodes. 2. Persistent activity in the RIGHT thyroid nodule unchanged. 3. Diffuse activity through the esophagus is favored esophagitis. No change. 4. Scattered foci of intense metabolic activity associated the bowel without focal lesion on CT favored benign. 5. No evidence of peritoneal metastasis. 6. No evidence of metastatic adenopathy in the abdomen pelvis. 7. No evidence of local pelvic sidewall recurrence.   07/30/2019 PET scan   1. Mildly increased uptake within small nodes along the right sternocleidomastoid without change in size and associated with increased deltoid activity may reflect changes related to right arm injection or recent COVID vaccination, consider clinical correlation and attention on follow-up. 2. Persistent activity in the right thyroid nodule, unchanged from previous exams. 3. New activity in a juxta esophageal lymph node in the chest is suspicious though the node is unchanged with respect to size. Endoscopic correlation could potentially be helpful or close attention on follow-up. 4. Subtle increased FDG uptake along the left mainstem bronchus and adjacent to the aorta may represent a small lymph node. No discrete correlate is demonstrated on today's study.   10/22/2019 PET scan   1. Persistent FDG avid  subcentimeter right supraclavicular and juxta esophageal lymph nodes. The degree of FDG uptake is similar to the previous exam. No new or progressive findings identified. 2. No findings of solid organ metastasis, skeletal metastasis or metastatic disease to the abdomen or pelvis. 3. FDG avid right lobe of thyroid gland nodule is again noted This nodule is not incidental and been worked up previously with 2 biopsies. Please refer to results from the most recent biopsy dated 07/18/2017. (Ref: J Am Coll Radiol. 2015 Feb;12(2): 143-50). 4.  Aortic Atherosclerosis (ICD10-I70.0).   05/05/2020 PET scan   1. Progressive disease, as evidenced by increased hypermetabolism within right supraclavicular and mediastinal nodes. 2.  No infradiaphragmatic hypermetabolic metastasis. 3.  Aortic Atherosclerosis (ICD10-I70.0). 4. Hypermetabolic right thyroid nodule has been detailed on prior exams and was biopsied on 07/18/2017   09/10/2020 PET scan   1. Interval improvement in previously demonstrated hypermetabolic mediastinal and right supraclavicular adenopathy. A single hypermetabolic left paraesophageal lymph node remains, improved from previous study. The other nodes have resolved.  2. No progressive disease. 3. Diffuse esophageal hypermetabolic activity suggesting esophagitis. 4. Hypermetabolic right thyroid nodule has been previously biopsied.   03/18/2021 PET scan   1. No significant change in single residual FDG avid left paraesophageal lymph node. This has an SUV max of 7.03, compared with 6.4 previously. No new sites of disease identified. 2. Diffuse esophageal hypermetabolic activity suggestive of esophagitis. 3. FDG avid right lobe of thyroid gland nodule. This has been biopsied previously. (Ref: J Am Coll Radiol. 2015 Feb;12(2): 143-50).     09/22/2021 PET scan   1. Persistent mild diffuse FDG uptake in the esophagus likely chronic inflammation. 2. Resolution of the left paraesophageal lymph node seen  on the prior study. 3. Stable hypermetabolic right thyroid nodule, previously biopsied. 4. No findings for recurrent abdominal/pelvic disease or metastatic disease.     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 Lenvima was held temporarily due to infection and severe hypertension     PHYSICAL EXAMINATION: ECOG PERFORMANCE STATUS: 1 - Symptomatic but completely ambulatory  Vitals:   01/12/22 1029  BP: (!) 118/55  Pulse: 66  Resp: 18  SpO2: 100%   Filed Weights   01/12/22 1027  Weight: 161 lb (73 kg)    GENERAL:alert, no distress and comfortable HEART: Diane Lozano has moderate bilateral lower extremity edema NEURO: alert & oriented x 3 with fluent speech, no focal motor/sensory deficits  LABORATORY DATA:  I have reviewed the data as listed    Component Value Date/Time   NA 136 12/15/2021 1006   NA 141 05/17/2017 0957   K 4.2 12/15/2021 1006   K 3.6 05/17/2017 0957   CL 101 12/15/2021 1006   CL 105 11/19/2012 0848   CO2 27 12/15/2021 1006   CO2 25 05/17/2017 0957   GLUCOSE 99 12/15/2021 1006   GLUCOSE 85 05/17/2017 0957   GLUCOSE 122 (H) 11/19/2012 0848   BUN 24 (H) 12/15/2021 1006   BUN 15.2 05/17/2017 0957   CREATININE 0.88 12/15/2021 1006   CREATININE 0.84 03/18/2021 1003   CREATININE 0.8 05/17/2017 0957   CALCIUM 9.8 12/15/2021 1006   CALCIUM 9.6 05/17/2017 0957   PROT 7.0 12/15/2021 1006   PROT 7.0 05/17/2017 0957   ALBUMIN 4.1 12/15/2021 1006   ALBUMIN 3.9 05/17/2017 0957   AST 22 12/15/2021 1006   AST 24 03/18/2021 1003   AST 25 05/17/2017 0957   ALT 17 12/15/2021 1006   ALT 21 03/18/2021 1003   ALT 18 05/17/2017 0957   ALKPHOS 47 12/15/2021 1006   ALKPHOS 57 05/17/2017 0957   BILITOT 0.4 12/15/2021 1006   BILITOT 0.6 03/18/2021 1003   BILITOT 0.47  05/17/2017 0957   GFRNONAA >60 12/15/2021 1006   GFRNONAA >60 03/18/2021 1003   GFRAA >60 02/26/2020 0940   GFRAA >60 07/10/2019 1240    No results found for: "SPEP", "UPEP"  Lab Results  Component Value Date   WBC 4.0 01/12/2022   NEUTROABS 2.8 01/12/2022  HGB 9.2 (L) 01/12/2022   HCT 28.3 (L) 01/12/2022   MCV 97.3 01/12/2022   PLT 174 01/12/2022      Chemistry      Component Value Date/Time   NA 136 12/15/2021 1006   NA 141 05/17/2017 0957   K 4.2 12/15/2021 1006   K 3.6 05/17/2017 0957   CL 101 12/15/2021 1006   CL 105 11/19/2012 0848   CO2 27 12/15/2021 1006   CO2 25 05/17/2017 0957   BUN 24 (H) 12/15/2021 1006   BUN 15.2 05/17/2017 0957   CREATININE 0.88 12/15/2021 1006   CREATININE 0.84 03/18/2021 1003   CREATININE 0.8 05/17/2017 0957   GLU 158 (H) 01/14/2015 1035      Component Value Date/Time   CALCIUM 9.8 12/15/2021 1006   CALCIUM 9.6 05/17/2017 0957   ALKPHOS 47 12/15/2021 1006   ALKPHOS 57 05/17/2017 0957   AST 22 12/15/2021 1006   AST 24 03/18/2021 1003   AST 25 05/17/2017 0957   ALT 17 12/15/2021 1006   ALT 21 03/18/2021 1003   ALT 18 05/17/2017 0957   BILITOT 0.4 12/15/2021 1006   BILITOT 0.6 03/18/2021 1003   BILITOT 0.47 05/17/2017 0957

## 2022-01-12 NOTE — Assessment & Plan Note (Signed)
She has significant bilateral lower extremity edema secondary to fluid retention and recent travel, missing her hydrochlorothiazide dose I recommend switching out hydrochlorothiazide and for her to start taking furosemide Recommend the patient to weigh herself daily The goal will be to get her back to approximately 151 pounds If furosemide is not helpful, she is instructed to call me

## 2022-01-12 NOTE — Assessment & Plan Note (Signed)
She desired to undergo cataract surgery This is planned for next week She has held her Lenvima for approximately 1 month I recommend she proceed as scheduled If she has no complications after her second eye surgery, she can resume Lenvima as early as 3 weeks after surgery

## 2022-01-12 NOTE — Assessment & Plan Note (Signed)
She has chronic anemia due to anemia chronic illness Observe closely

## 2022-01-13 ENCOUNTER — Other Ambulatory Visit (HOSPITAL_COMMUNITY): Payer: Self-pay

## 2022-01-13 ENCOUNTER — Other Ambulatory Visit: Payer: Self-pay | Admitting: Hematology and Oncology

## 2022-01-13 ENCOUNTER — Other Ambulatory Visit: Payer: Self-pay

## 2022-01-13 DIAGNOSIS — H04123 Dry eye syndrome of bilateral lacrimal glands: Secondary | ICD-10-CM | POA: Diagnosis not present

## 2022-01-16 ENCOUNTER — Encounter: Payer: Self-pay | Admitting: Hematology and Oncology

## 2022-01-16 ENCOUNTER — Other Ambulatory Visit (HOSPITAL_COMMUNITY): Payer: Self-pay

## 2022-01-16 MED ORDER — METOPROLOL TARTRATE 25 MG PO TABS
ORAL_TABLET | Freq: Two times a day (BID) | ORAL | 3 refills | Status: DC
  Filled 2022-01-16: qty 180, 90d supply, fill #0
  Filled 2022-04-04: qty 180, 90d supply, fill #1
  Filled 2022-07-18: qty 180, 90d supply, fill #2
  Filled 2022-10-23: qty 180, 90d supply, fill #3

## 2022-01-18 DIAGNOSIS — H269 Unspecified cataract: Secondary | ICD-10-CM | POA: Diagnosis not present

## 2022-01-18 DIAGNOSIS — H2512 Age-related nuclear cataract, left eye: Secondary | ICD-10-CM | POA: Diagnosis not present

## 2022-01-24 DIAGNOSIS — M25561 Pain in right knee: Secondary | ICD-10-CM | POA: Diagnosis not present

## 2022-01-25 DIAGNOSIS — H269 Unspecified cataract: Secondary | ICD-10-CM | POA: Diagnosis not present

## 2022-01-25 DIAGNOSIS — H2511 Age-related nuclear cataract, right eye: Secondary | ICD-10-CM | POA: Diagnosis not present

## 2022-01-25 DIAGNOSIS — E039 Hypothyroidism, unspecified: Secondary | ICD-10-CM | POA: Diagnosis not present

## 2022-01-26 ENCOUNTER — Telehealth: Payer: Self-pay

## 2022-01-26 NOTE — Telephone Encounter (Signed)
Returned her call. She finished her 2nd cataract surgery yesterday. Told her per Dr. Alvy Bimler, she may resume Lenvima on the 3 rd day post op. She verbalized understanding.

## 2022-01-27 ENCOUNTER — Other Ambulatory Visit: Payer: Self-pay

## 2022-01-31 DIAGNOSIS — M25561 Pain in right knee: Secondary | ICD-10-CM | POA: Diagnosis not present

## 2022-02-01 ENCOUNTER — Other Ambulatory Visit (HOSPITAL_COMMUNITY): Payer: Self-pay

## 2022-02-02 ENCOUNTER — Other Ambulatory Visit (HOSPITAL_COMMUNITY): Payer: Self-pay

## 2022-02-02 ENCOUNTER — Inpatient Hospital Stay: Payer: 59

## 2022-02-02 ENCOUNTER — Other Ambulatory Visit: Payer: Self-pay

## 2022-02-02 VITALS — BP 129/71 | HR 58 | Temp 98.1°F | Resp 17 | Wt 158.5 lb

## 2022-02-02 DIAGNOSIS — C770 Secondary and unspecified malignant neoplasm of lymph nodes of head, face and neck: Secondary | ICD-10-CM

## 2022-02-02 DIAGNOSIS — C541 Malignant neoplasm of endometrium: Secondary | ICD-10-CM

## 2022-02-02 DIAGNOSIS — Z5112 Encounter for antineoplastic immunotherapy: Secondary | ICD-10-CM | POA: Diagnosis not present

## 2022-02-02 DIAGNOSIS — Z95828 Presence of other vascular implants and grafts: Secondary | ICD-10-CM

## 2022-02-02 DIAGNOSIS — E039 Hypothyroidism, unspecified: Secondary | ICD-10-CM

## 2022-02-02 DIAGNOSIS — D539 Nutritional anemia, unspecified: Secondary | ICD-10-CM | POA: Diagnosis not present

## 2022-02-02 DIAGNOSIS — Z79899 Other long term (current) drug therapy: Secondary | ICD-10-CM | POA: Diagnosis not present

## 2022-02-02 DIAGNOSIS — Z7189 Other specified counseling: Secondary | ICD-10-CM

## 2022-02-02 DIAGNOSIS — H269 Unspecified cataract: Secondary | ICD-10-CM | POA: Diagnosis not present

## 2022-02-02 LAB — COMPREHENSIVE METABOLIC PANEL
ALT: 20 U/L (ref 0–44)
AST: 22 U/L (ref 15–41)
Albumin: 4.5 g/dL (ref 3.5–5.0)
Alkaline Phosphatase: 59 U/L (ref 38–126)
Anion gap: 5 (ref 5–15)
BUN: 31 mg/dL — ABNORMAL HIGH (ref 8–23)
CO2: 29 mmol/L (ref 22–32)
Calcium: 10 mg/dL (ref 8.9–10.3)
Chloride: 105 mmol/L (ref 98–111)
Creatinine, Ser: 0.94 mg/dL (ref 0.44–1.00)
GFR, Estimated: >60 mL/min (ref >60)
Glucose, Bld: 92 mg/dL (ref 70–99)
Potassium: 3.8 mmol/L (ref 3.5–5.1)
Sodium: 139 mmol/L (ref 135–145)
Total Bilirubin: 0.4 mg/dL (ref 0.3–1.2)
Total Protein: 7.5 g/dL (ref 6.5–8.1)

## 2022-02-02 LAB — CBC WITH DIFFERENTIAL/PLATELET
Abs Immature Granulocytes: 0.01 10*3/uL (ref 0.00–0.07)
Basophils Absolute: 0 10*3/uL (ref 0.0–0.1)
Basophils Relative: 1 %
Eosinophils Absolute: 0.3 10*3/uL (ref 0.0–0.5)
Eosinophils Relative: 7 %
HCT: 32.6 % — ABNORMAL LOW (ref 36.0–46.0)
Hemoglobin: 10.8 g/dL — ABNORMAL LOW (ref 12.0–15.0)
Immature Granulocytes: 0 %
Lymphocytes Relative: 22 %
Lymphs Abs: 0.8 10*3/uL (ref 0.7–4.0)
MCH: 31.8 pg (ref 26.0–34.0)
MCHC: 33.1 g/dL (ref 30.0–36.0)
MCV: 95.9 fL (ref 80.0–100.0)
Monocytes Absolute: 0.3 10*3/uL (ref 0.1–1.0)
Monocytes Relative: 9 %
Neutro Abs: 2.3 10*3/uL (ref 1.7–7.7)
Neutrophils Relative %: 61 %
Platelets: 193 10*3/uL (ref 150–400)
RBC: 3.4 MIL/uL — ABNORMAL LOW (ref 3.87–5.11)
RDW: 12.8 % (ref 11.5–15.5)
WBC: 3.7 10*3/uL — ABNORMAL LOW (ref 4.0–10.5)
nRBC: 0 % (ref 0.0–0.2)

## 2022-02-02 LAB — TSH: TSH: 0.278 u[IU]/mL — ABNORMAL LOW (ref 0.350–4.500)

## 2022-02-02 MED ORDER — SODIUM CHLORIDE 0.9 % IV SOLN
200.0000 mg | Freq: Once | INTRAVENOUS | Status: AC
  Administered 2022-02-02: 200 mg via INTRAVENOUS
  Filled 2022-02-02: qty 200

## 2022-02-02 MED ORDER — SODIUM CHLORIDE 0.9% FLUSH
10.0000 mL | Freq: Once | INTRAVENOUS | Status: AC
  Administered 2022-02-02: 10 mL

## 2022-02-02 MED ORDER — HEPARIN SOD (PORK) LOCK FLUSH 100 UNIT/ML IV SOLN
500.0000 [IU] | Freq: Once | INTRAVENOUS | Status: AC | PRN
  Administered 2022-02-02: 500 [IU]

## 2022-02-02 MED ORDER — TACROLIMUS 0.1 % EX OINT
TOPICAL_OINTMENT | Freq: Two times a day (BID) | CUTANEOUS | 0 refills | Status: DC
  Filled 2022-02-02: qty 30, 30d supply, fill #0

## 2022-02-02 MED ORDER — SODIUM CHLORIDE 0.9% FLUSH
10.0000 mL | INTRAVENOUS | Status: DC | PRN
  Administered 2022-02-02: 10 mL

## 2022-02-02 MED ORDER — SODIUM CHLORIDE 0.9 % IV SOLN: Freq: Once | INTRAVENOUS | Status: AC

## 2022-02-03 ENCOUNTER — Other Ambulatory Visit: Payer: Self-pay

## 2022-02-03 ENCOUNTER — Telehealth: Payer: Self-pay

## 2022-02-03 ENCOUNTER — Other Ambulatory Visit (HOSPITAL_COMMUNITY): Payer: Self-pay

## 2022-02-03 MED ORDER — LEVOTHYROXINE SODIUM 100 MCG PO TABS
100.0000 ug | ORAL_TABLET | Freq: Every day | ORAL | 1 refills | Status: DC
  Filled 2022-02-03: qty 60, 60d supply, fill #0
  Filled 2022-03-30: qty 60, 60d supply, fill #1

## 2022-02-03 NOTE — Telephone Encounter (Signed)
Called and left below message. Ask her to call the office back for questions. Rx sent to [facility].

## 2022-02-03 NOTE — Telephone Encounter (Signed)
-----   Message from [name], MD sent at 02/03/2022  8:03 AM EDT ----- Her TSH is low meaning dose of synthroid needs to be lowered I recommend changing to 100 mcg PO Please call her and send it to her pharmacy of choice, 60 tabs, 1 refills

## 2022-02-07 DIAGNOSIS — M25561 Pain in right knee: Secondary | ICD-10-CM | POA: Diagnosis not present

## 2022-02-08 ENCOUNTER — Other Ambulatory Visit (HOSPITAL_COMMUNITY): Payer: Self-pay

## 2022-02-09 ENCOUNTER — Other Ambulatory Visit (HOSPITAL_COMMUNITY): Payer: Self-pay

## 2022-02-13 ENCOUNTER — Telehealth: Payer: Self-pay | Admitting: Pharmacy Technician

## 2022-02-13 ENCOUNTER — Other Ambulatory Visit (HOSPITAL_COMMUNITY): Payer: Self-pay

## 2022-02-13 NOTE — Telephone Encounter (Signed)
Oral Oncology Patient Advocate Encounter   Received notification that prior authorization for Diane Lozano is due for renewal.   PA submitted on 02/13/2022 Key [id number]  Status is pending     Diane Lozano, [place] Patient [place] Direct Number: [phone]  Fax: [phone]

## 2022-02-14 ENCOUNTER — Other Ambulatory Visit (HOSPITAL_COMMUNITY): Payer: Self-pay

## 2022-02-14 DIAGNOSIS — M25561 Pain in right knee: Secondary | ICD-10-CM | POA: Diagnosis not present

## 2022-02-14 NOTE — Telephone Encounter (Signed)
Oral Oncology Patient Advocate Encounter  Prior Authorization for Diane Lozano has been approved.    PA# [id number] Effective dates: 02/13/2022 through 02/13/2023  Patients co-pay is $0.    Diane Lozano, CPhT-Adv Oncology Pharmacy Patient [place] Direct Number: [phone]  Fax: [phone]

## 2022-02-21 DIAGNOSIS — M25561 Pain in right knee: Secondary | ICD-10-CM | POA: Diagnosis not present

## 2022-02-23 ENCOUNTER — Inpatient Hospital Stay: Payer: 59 | Attending: Hematology and Oncology | Admitting: Hematology and Oncology

## 2022-02-23 ENCOUNTER — Encounter: Payer: Self-pay | Admitting: Hematology and Oncology

## 2022-02-23 ENCOUNTER — Inpatient Hospital Stay: Payer: 59

## 2022-02-23 ENCOUNTER — Other Ambulatory Visit: Payer: Self-pay

## 2022-02-23 VITALS — BP 145/66 | HR 68 | Resp 18 | Ht 64.0 in | Wt 155.4 lb

## 2022-02-23 DIAGNOSIS — I10 Essential (primary) hypertension: Secondary | ICD-10-CM

## 2022-02-23 DIAGNOSIS — C541 Malignant neoplasm of endometrium: Secondary | ICD-10-CM

## 2022-02-23 DIAGNOSIS — D539 Nutritional anemia, unspecified: Secondary | ICD-10-CM | POA: Diagnosis not present

## 2022-02-23 DIAGNOSIS — E039 Hypothyroidism, unspecified: Secondary | ICD-10-CM | POA: Diagnosis not present

## 2022-02-23 DIAGNOSIS — Z5112 Encounter for antineoplastic immunotherapy: Secondary | ICD-10-CM | POA: Diagnosis not present

## 2022-02-23 DIAGNOSIS — Z79899 Other long term (current) drug therapy: Secondary | ICD-10-CM | POA: Insufficient documentation

## 2022-02-23 DIAGNOSIS — C770 Secondary and unspecified malignant neoplasm of lymph nodes of head, face and neck: Secondary | ICD-10-CM | POA: Diagnosis not present

## 2022-02-23 DIAGNOSIS — Z95828 Presence of other vascular implants and grafts: Secondary | ICD-10-CM

## 2022-02-23 LAB — COMPREHENSIVE METABOLIC PANEL
ALT: 13 U/L (ref 0–44)
AST: 19 U/L (ref 15–41)
Albumin: 4.1 g/dL (ref 3.5–5.0)
Alkaline Phosphatase: 52 U/L (ref 38–126)
Anion gap: 4 — ABNORMAL LOW (ref 5–15)
BUN: 22 mg/dL (ref 8–23)
CO2: 31 mmol/L (ref 22–32)
Calcium: 9.7 mg/dL (ref 8.9–10.3)
Chloride: 102 mmol/L (ref 98–111)
Creatinine, Ser: 0.86 mg/dL (ref 0.44–1.00)
GFR, Estimated: >60 mL/min (ref >60)
Glucose, Bld: 92 mg/dL (ref 70–99)
Potassium: 4.2 mmol/L (ref 3.5–5.1)
Sodium: 137 mmol/L (ref 135–145)
Total Bilirubin: 0.5 mg/dL (ref 0.3–1.2)
Total Protein: 6.9 g/dL (ref 6.5–8.1)

## 2022-02-23 LAB — CBC WITH DIFFERENTIAL/PLATELET
Abs Immature Granulocytes: 0 10*3/uL (ref 0.00–0.07)
Basophils Absolute: 0 10*3/uL (ref 0.0–0.1)
Basophils Relative: 1 %
Eosinophils Absolute: 0.3 10*3/uL (ref 0.0–0.5)
Eosinophils Relative: 10 %
HCT: 31 % — ABNORMAL LOW (ref 36.0–46.0)
Hemoglobin: 10.5 g/dL — ABNORMAL LOW (ref 12.0–15.0)
Immature Granulocytes: 0 %
Lymphocytes Relative: 21 %
Lymphs Abs: 0.7 10*3/uL (ref 0.7–4.0)
MCH: 31.9 pg (ref 26.0–34.0)
MCHC: 33.9 g/dL (ref 30.0–36.0)
MCV: 94.2 fL (ref 80.0–100.0)
Monocytes Absolute: 0.2 10*3/uL (ref 0.1–1.0)
Monocytes Relative: 7 %
Neutro Abs: 2 10*3/uL (ref 1.7–7.7)
Neutrophils Relative %: 61 %
Platelets: 175 10*3/uL (ref 150–400)
RBC: 3.29 MIL/uL — ABNORMAL LOW (ref 3.87–5.11)
RDW: 12.6 % (ref 11.5–15.5)
WBC: 3.3 10*3/uL — ABNORMAL LOW (ref 4.0–10.5)
nRBC: 0 % (ref 0.0–0.2)

## 2022-02-23 LAB — TSH: TSH: 0.438 u[IU]/mL (ref 0.350–4.500)

## 2022-02-23 LAB — TOTAL PROTEIN, URINE DIPSTICK: Protein, ur: NEGATIVE mg/dL

## 2022-02-23 MED ORDER — SODIUM CHLORIDE 0.9 % IV SOLN: Freq: Once | INTRAVENOUS | Status: AC

## 2022-02-23 MED ORDER — SODIUM CHLORIDE 0.9% FLUSH
10.0000 mL | Freq: Once | INTRAVENOUS | Status: AC
  Administered 2022-02-23: 10 mL

## 2022-02-23 MED ORDER — SODIUM CHLORIDE 0.9 % IV SOLN
200.0000 mg | Freq: Once | INTRAVENOUS | Status: AC
  Administered 2022-02-23: 200 mg via INTRAVENOUS
  Filled 2022-02-23: qty 200

## 2022-02-23 MED ORDER — HEPARIN SOD (PORK) LOCK FLUSH 100 UNIT/ML IV SOLN
500.0000 [IU] | Freq: Once | INTRAVENOUS | Status: AC | PRN
  Administered 2022-02-23: 500 [IU]

## 2022-02-23 MED ORDER — SODIUM CHLORIDE 0.9% FLUSH
10.0000 mL | INTRAVENOUS | Status: DC | PRN
  Administered 2022-02-23: 10 mL

## 2022-02-23 NOTE — Progress Notes (Signed)
[place] OFFICE PROGRESS NOTE  Patient Care Team: [name], MD as PCP - General (Family Medicine) Reynold Bowen, MD as Consulting Physician (Endocrinology)  ASSESSMENT & PLAN:  Endometrial cancer [facility]) Her last PET CT showed excellent response to treatment PET CT scan is considered to be normal She will continue maintenance pembrolizumab with Lenvima indefinitely I plan to repeat imaging study again at the end of the year   Acquired hypothyroidism She has intermittent elevated TSH I will adjust her thyroid medicine accordingly  Deficiency anemia She has chronic anemia due to anemia chronic illness and chronic leukopenia She also have a history of vitamin B12 deficiency and is giving herself injection once a month Observe closely  Orders Placed This Encounter  Procedures   CBC with Differential ([place] Only)    Standing Status:   Future    Standing Expiration Date:   03/17/2023   CMP ([place] only)    Standing Status:   Future    Standing Expiration Date:   03/17/2023   CBC with Differential ([place] Only)    Standing Status:   Future    Standing Expiration Date:   04/07/2023   CMP ([place] only)    Standing Status:   Future    Standing Expiration Date:   04/07/2023   T4    Standing Status:   Future    Standing Expiration Date:   04/07/2023   TSH    Standing Status:   Future    Standing Expiration Date:   04/07/2023   CBC with Differential ([place] Only)    Standing Status:   Future    Standing Expiration Date:   05/05/2023   CMP ([place] only)    Standing Status:   Future    Standing Expiration Date:   05/05/2023    All questions were answered. The patient knows to call the clinic with any problems, questions or concerns. The total time spent in the appointment was 25 minutes encounter with patients including review of chart and various tests results, discussions about plan of care and coordination of care  plan   [name], MD 02/23/2022 11:11 AM  INTERVAL HISTORY: Please see below for problem oriented charting. she returns for treatment follow-up  She is doing well Her vision has improved since recent cataract surgery She has no new complaints  REVIEW OF SYSTEMS:   Constitutional: Denies fevers, chills or abnormal weight loss Eyes: Denies blurriness of vision Ears, nose, mouth, throat, and face: Denies mucositis or sore throat Respiratory: Denies cough, dyspnea or wheezes Cardiovascular: Denies palpitation, chest discomfort or lower extremity swelling Gastrointestinal:  Denies nausea, heartburn or change in bowel habits Skin: Denies abnormal skin rashes Lymphatics: Denies new lymphadenopathy or easy bruising Neurological:Denies numbness, tingling or new weaknesses Behavioral/Psych: Mood is stable, no new changes  All other systems were reviewed with the patient and are negative.  I have reviewed the past medical history, past surgical history, social history and family history with the patient and they are unchanged from previous note.  ALLERGIES:  is allergic to lenvima [lenvatinib].  MEDICATIONS:  Current Outpatient Medications  Medication Sig Dispense Refill   amLODipine (NORVASC) 10 MG tablet TAKE 1 TABLET BY MOUTH ONCE DAILY. **DECREASE SIMVASTATIN TO 20 MG DAILY WHILE ON AMLODIPINE PER MD** 90 tablet 1   busPIRone (BUSPAR) 10 MG tablet Take 1 tablet by mouth 2 times a day 180 tablet 3   Calcium Carb-Cholecalciferol 500-600 MG-UNIT TABS Take 1  tablet by mouth daily.      Cholecalciferol (VITAMIN D3) 2000 units TABS Take 2,000 Units by mouth daily.      cyanocobalamin (DODEX) 1000 MCG/ML injection Inject 1 mL into the muscle every 30 days. 1 mL 11   furosemide (LASIX) 20 MG tablet Take 1 tablet (20 mg total) by mouth daily. 30 tablet 0   hydrochlorothiazide (MICROZIDE) 12.5 MG capsule Take 1 capsule (12.5 mg total) by mouth daily. 90 capsule 3   lenvatinib 10 mg daily dose  (LENVIMA, 10 MG DAILY DOSE,) capsule TAKE 1 CAPSULE (10 MG TOTAL) BY MOUTH DAILY. 30 each 11   levothyroxine (SYNTHROID) 100 MCG tablet Take 1 tablet (100 mcg total) by mouth daily before breakfast. 60 tablet 1   lidocaine-prilocaine (EMLA) cream APPLY TO AFFECTED AREA ONCE AS DIRECTED 30 g 3   losartan (COZAAR) 25 MG tablet TAKE 1 TABLET BY MOUTH DAILY 90 tablet 3   metoprolol tartrate (LOPRESSOR) 25 MG tablet TAKE 1 TABLET BY MOUTH TWICE DAILY 180 tablet 3   ondansetron (ZOFRAN) 8 MG tablet Take 1 tablet (8 mg total) by mouth every 8 (eight) hours as needed for nausea. 30 tablet 3   pantoprazole (PROTONIX) 40 MG tablet TAKE 1 TABLET BY MOUTH ONCE DAILY 90 tablet 3   simvastatin (ZOCOR) 20 MG tablet Take 1 tablet by mouth daily in the evening. 90 tablet 3   sodium fluoride (PREVIDENT 5000 PLUS) 1.1 % CREA dental cream Use as directed 51 g 12   SYRINGE-NEEDLE, DISP, 3 ML 25G X 5/8" 3 ML MISC Use as directed to administer B12 shot every 30 days. 12 each 0   tacrolimus (PROTOPIC) 0.1 % ointment Apply 1 application topically daily as needed (rash).      tacrolimus (PROTOPIC) 0.1 % ointment Apply to affected area on the skin twice a day 30 g 0   venlafaxine XR (EFFEXOR XR) 150 MG 24 hr capsule Take 2 capsules by mouth in the morning with food 180 capsule 3   No current facility-administered medications for this visit.   Facility-Administered Medications Ordered in Other Visits  Medication Dose Route Frequency Provider Last Rate Last Admin   pembrolizumab (KEYTRUDA) 200 mg in sodium chloride 0.9 % 50 mL chemo infusion  200 mg Intravenous Once Alvy Bimler, Ni, MD 116 mL/hr at 02/23/22 1108 200 mg at 02/23/22 1108    SUMMARY OF ONCOLOGIC HISTORY: Oncology History Overview Note  [employer] testing done 11-2015 on surgical path from 2014:    MS stable   TMB low  4 muts/mb   ATM [id number]   ERBB3 T389K   E2H2 rearrangement exon 9   PPP2R1A P179R   TP53 I195N    ER- APPROXIMATELY 25-35% STAINING IN  NEOPLASTIC CELLS (INTERMEDIATE)  PR- APPROXIMATELY 25-35% STAINING IN NEOPLASTIC CELLS (STRONG)  Repeat biopsy 04/02/17: ER negative, Her 2 negative  Progressed on Doxil   Endometrial cancer ([place])  06/20/2012 Pathology Results   Biopsy positive for papillary serous carcinoma   06/20/2012 Genetic Testing   [employer] testing done 11-2015 on surgical path from 2014:    MS stable   TMB low  4 muts/mb   ATM [id number]   ERBB3 T389K   E2H2 rearrangement exon 9   PPP2R1A P179R   TP53 I195N    06/20/2012 Initial Diagnosis   Patient presented to PCP with intermittent vaginal bleeding since ~ Oct 2013, endometrial biopsy 06-20-12 with complex endometrial hyperplasia with atypia   06/26/2012 Imaging   Thickened endometrial lining  in a postmenopausal patient experiencing vaginal bleeding. In the setting of post-menopausal bleeding, endometrial sampling is indicated to exclude carcinoma. No focal myometrial abnormalities are seen.  Normal left ovary and non-visualized right ovary   07/30/2012 Surgery   Dr. Polly Cobia performed robotic hysterectomy with bilateral salpingo-oophorectomy, bilateral pelvic lymph node dissection and periaortic lymph node dissection. Intraoperatively on frozen section, the patient was noted to have a large endometrial polyp with changes within the polyp consistent for high-grade malignancy, possibly papillary serous carcinoma. There is no obvious extrauterine disease noted.     07/30/2012 Pathology Results   [phone] SUPPLEMENTAL REPORT  THE ENDOMETRIAL CARCINOMA WAS ANALYZED FOR DNA MISMATCH REPAIR PROTEINS.  IMMUNOHISTOCHEMICALLY, THE NEOPLASM RETAINED NUCLEAR EXPRESSION OF 4 GENE PRODUCTS, MLH1, MSH2, MSH6, AND PMS2, INVOLVED IN DNA MISMATCH REPAIR.  POSITIVE AND NEGATIVE CONTROLS WORKED APPROPRIATELY.  PER REQUEST, AN ER AND PR ARE PERFORMED ON BLOCK 1I.  ER- APPROXIMATELY 25-35% STAINING IN NEOPLASTIC CELLS (INTERMEDIATE)  PR- APPROXIMATELY 25-35% STAINING IN  NEOPLASTIC CELLS (STRONG)  ER AND PR PREDOMINANTLY SHOW STAINING IN THE SEROUS COMPONENT.  DIAGNOSIS  1. UTERUS, CERVIX WITH BILATERAL FALLOPIAN TUBES AND OVARIES,  HYSTERECTOMY AND BILATERAL SALPINGO-OOPHORECTOMY:  HIGH GRADE/POORLY DIFFERENTIATED ENDOMETRIAL ADENOCARCINOMA WITH SOLID AND SEROUS PAPILLARY COMPONENTS.  HISTOPATHOLOGIC TYPE:    A VARIETY OF PATTERNS WERE PRESENT, ALL OF WHICH SHOULD BE CONSIDERED TO BE HIGH GRADE.  SEROUS PAPILLARY CARCINOMA WAS SEEN OCCURRING ADJACENT TO SOLID ENDOMETRIAL CARCINOMA WITH MARKED ANAPLASIA AND GIANT CELLS. SIZE: TUMOR MEASURED AT LEAST 4.8 CM IN GREATEST HORIZONTAL DIMENSION.  GRADE: POORLY DIFFERENTIATED OR HIGH GRADE. DEPTH OF INVASION: NO DEFINITE MYOMETRIAL INVOLVEMENT WAS SEEN.  THE TUMOR APPEARED CONFINED TO THE POLYP AS WELL AS SURFACE ENDOMETRIUM. WHERE MYOMETRIAL THICKNESS NOT APPLICABLE. SEROSAL INVOLVEMENT:       NOT DEMONSTRATED.  ENDOCERVICAL INVOLVEMENT:  NOT DEMONSTRATED. RESECTION MARGINS:         FREE OF INVOLVEMENT. EXTRAUTERINE EXTENSION:    NOT DEMONSTRATED. ANGIOLYMPHATIC INVASION:   NOT DEFINITELY SEEN. TOTAL NODES EXAMINED:      22.    PELVIC NODES EXAMINED:  20.    PELVIC NODES INVOLVED:  0.    PARA-AORTIC NODES       2. EXAMINED:    PARA-AORTIC NODES       0. INVOLVED TNM STAGE:                 T1A N0 MX. AJCC STAGE GROUPING:       IA. FIGO STAGE:                IA.   08/26/2012 Imaging   No CT evidence for intra-abdominal or pelvic metastatic disease. Trace free pelvic fluid, presumably postoperative although the date of surgery is not documented in the electronic medical record.   08/26/2012 - 12/13/2012 Chemotherapy   She received 6 cycles of carbo/taxol    10/29/2012 - 11/27/2012 Radiation Therapy   May 27, June 5,  June 11, June 19, November 27, 2012: Proximal vagina 30 Gy in 5 fractions      01/17/2013 Imaging   1.  No evidence of recurrent or metastatic disease. 2.  No acute abnormality involving the abdomen  or pelvis. 3.  Mild diffuse hepatic steatosis. 4.  Very small supraumbilical midline anterior abdominal wall hernia containing fat, unchanged   01/22/2014 Imaging   No evidence for metastatic or recurrent disease. 2. No bowel obstruction.  Normal appendix. 3. Small fat containing hernia, stable in appearance. 4. Status post hysterectomy and  bilateral oophorectomy   02/19/2014 Imaging   No pulmonary lesions are identified. The abnormality on the chest x-ray is due to asymmetric left-sided sternoclavicular joint degenerative disease   10/12/2014 Imaging   New mild retroperitoneal lymphadenopathy in the left paraaortic region and proximal left common iliac chain, consistent with metastatic disease. No other sites of metastatic disease identified within the abdomen or pelvis.       11/27/2014 - 02/11/2015 Chemotherapy   She received 4 cycles of carbo/taxol    03/01/2015 PET scan   Single hypermetabolic small retroperitoneal lymph node along the aorta. 2. No evidence of metastatic disease otherwise in the abdomen or pelvis. No evidence local recurrence. 3. Intensely hypermetabolic enlarged nodule adjacent to the RIGHT lobe of thyroid gland. This presumably represents the biopsied lesion in clinician report which was found to be benign thyroid tissue.   04/05/2015 - 05/13/2015 Radiation Therapy   She received 50.4 gray in 28 fractions with simultaneous integrated boost to 56 gray   06/17/2015 Imaging   No acute process or evidence of metastatic disease in the abdomen or pelvis. Resolution of previously described retroperitoneal adenopathy. 2.  Possible constipation. 3. Atherosclerosis.   06/17/2015 Tumor Marker   Patient's tumor was tested for the following markers: CA125 Results of the tumor marker test revealed 33   08/12/2015 Tumor Marker   Patient's tumor was tested for the following markers: CA125 Results of the tumor marker test revealed 52   08/31/2015 PET scan   Development of right  paratracheal hypermetabolic adenopathy, consistent with nodal metastasis. 2. The previously described isolated abdominal retroperitoneal hypermetabolic node has resolved. 3. Persistent hypermetabolic right thyroid nodule, per report previously biopsied. Correlate with those results. 4.  Possible constipation.   09/09/2015 -  Anti-estrogen oral therapy   She has been receiving alternative treatment between megace and tamoxifen   09/09/2015 Tumor Marker   Patient's tumor was tested for the following markers: CA125 Results of the tumor marker test revealed 37.8   09/20/2015 Procedure   Technically successful ultrasound-guided thyroid aspiration biopsy , dominant right nodule   09/20/2015 Pathology Results   THYROID, RIGHT, FINE NEEDLE ASPIRATION (SPECIMEN 1 OF 1 COLLECTED 09/20/15): FINDINGS CONSISTENT WITH BENIGN THYROID NODULE (BETHESDA CATEGORY II).   10/28/2015 Tumor Marker   Patient's tumor was tested for the following markers: CA125 Results of the tumor marker test revealed 53.2   11/04/2015 Imaging   Stable benign right thyroid nodule   12/16/2015 Tumor Marker   Patient's tumor was tested for the following markers: CA125 Results of the tumor marker test revealed 38.1   03/22/2016 PET scan   Interval progression of hypermetabolic right paratracheal lymph node consistent with metastatic involvement. 2. Stable hypermetabolic right thyroid nodule. Reportedly this has been biopsied in the past.   03/23/2016 Tumor Marker   Patient's tumor was tested for the following markers: CA125 Results of the tumor marker test revealed 62.4   04/13/2016 - 06/01/2016 Radiation Therapy   She received 56 Gy to the chest in 28 fractions    05/09/2016 Imaging   No evidence of left lower extremity deep vein thrombosis. No evidence of a superficial thrombosis of the greater and lesser saphenous veins. Positive for thrombus noted in several varicosities of the calf. No evidence of Baker's cyst on the left.    05/17/2016 Tumor Marker   Patient's tumor was tested for the following markers: CA125 Results of the tumor marker test revealed 9   06/29/2016 Tumor Marker   Patient's tumor was tested  for the following markers: CA125 Results of the tumor marker test revealed 5.9   08/04/2016 Tumor Marker   Patient's tumor was tested for the following markers: CA125 Results of the tumor marker test revealed 6.0   09/12/2016 PET scan   Complete metabolic response to therapy, with resolution of hypermetabolic mediastinal lymphadenopathy since prior exam. No residual or new metastatic disease identified. Stable hypermetabolic right thyroid lobe nodule, which was previously biopsied on 09/20/2015.   03/13/2017 PET scan   1. Solitary focus of recurrent right paratracheal hypermetabolic activity, with a 1.0 cm right lower paratracheal node having a maximum SUV of 9.0. Appearance compatible with recurrent malignancy. 2. Continued hypermetabolic right thyroid nodule, previously biopsied, presumed benign -correlate with prior biopsy results. 3. Other imaging findings of potential clinical significance: Aortic Atherosclerosis (ICD10-I70.0). Mild cardiomegaly. Prominent stool throughout the colon favors constipation.   04/02/2017 Pathology Results   FINE NEEDLE ASPIRATION, ENDOSCOPIC, (EBUS) 4 R NODE (SPECIMEN 1 OF 2 COLLECTED 04/02/17): MALIGNANT CELLS PRESENT, CONSISTENT WITH CARCINOMA. SEE COMMENT. COMMENT: THE MALIGNANT CELLS ARE POSITIVE FOR P53 AND NEGATIVE FOR ESTROGEN RECEPTOR AND TTF-1. THIS PROFILE IS NON-SPECIFIC, BUT THE P53 POSITIVE STAINING IS SUGGESTIVE OF GYNECOLOGIC PRIMARY.    04/11/2017 Procedure   Successful 8 French right internal jugular vein power port placement with its tip at the SVC/RA junction   04/12/2017 Imaging   Normal LV size with mild LV hypertrophy. EF 55-60%. Normal RV size and systolic function. Aortic valve sclerosis without significant stenosis.   04/18/2017 - 06/14/2017  Chemotherapy   The patient had chemotherapy with Doxil    07/10/2017 Imaging   - Left ventricle: The cavity size was normal. There was mild concentric hypertrophy. Systolic function was normal. The estimated ejection fraction was in the range of 55% to 60%. Wall motion was normal; there were no regional wall motion abnormalities. Doppler parameters are consistent with abnormal left ventricular relaxation (grade 1 diastolic dysfunction). - Aortic valve: Noncoronary cusp mobility was mildly restricted. - Mitral valve: There was mild regurgitation. - Left atrium: The atrium was mildly dilated. - Atrial septum: No defect or patent foramen ovale was identified.  Impressions:  - Compared to November 2018, global LV longitudinal strain remains normal (has increased)   07/11/2017 PET scan   Increased size and hypermetabolic activity of 3.3 cm right thyroid lobe nodule. Thyroid carcinoma cannot be excluded. Recommend repeat ultrasound guided fine needle aspiration to exclude thyroid carcinoma.  New adjacent hypermetabolic 10 mm right supraclavicular lymph node, suspicious for lymph node metastasis.  Slight increase in size and hypermetabolic activity of solitary right paratracheal lymph node.  No evidence of abdominal or pelvic metastatic disease.   07/18/2017 Procedure   1. Technically successful ultrasound guided fine needle aspiration of indeterminate hypermetabolic right-sided thyroid nodule/mass. 2. Technically successful ultrasound-guided core needle biopsy of hypermetabolic right lower cervical lymph node.   07/18/2017 Pathology Results   THYROID, FINE NEEDLE ASPIRATION RIGHT (SPECIMEN 1 OF 1, COLLECTED ON 07/18/2017): ATYPIA OF UNDETERMINED SIGNIFICANCE OR FOLLICULAR LESION OF UNDETERMINED SIGNIFICANCE (BETHESDA CATEGORY III). SEE COMMENT. COMMENT: THE SPECIMEN CONSISTS OF SMALL AND MEDIUM SIZED GROUPS OF FOLLICULAR EPITHELIAL CELLS WITH MILD CYTOLOGIC ATYPIA INCLUDING NUCLEAR ENLARGEMENT AND  HURTHLE CELL CHANGE. SOME GROUPS ARE ARRANGED AS MICROFOLLICLES. THERE IS MINIMAL BACKGROUND COLLOID. BASED ON THESE FEATURES, A FOLLICULAR LESION/NEOPLASM CAN NOT BE ENTIRELY RULED OUT. A SPECIMEN WILL BE SENT FOR AFIRMA TESTING.   07/19/2017 Pathology Results   Lymph node, needle/core biopsy - METASTATIC PAPILLARY SEROUS CARCINOMA. - SEE COMMENT.  Microscopic Comment Dr. Vicente Males has reviewed the case and concurs with this interpretation   10/15/2017 PET scan   No new or progressive disease. No evidence of abdominal or pelvic metastatic disease.  Stable hypermetabolic right thyroid lobe nodule and adjacent right supraclavicular lymph node.  Decreased size and hypermetabolic activity of solitary right paratracheal lymph node.   01/23/2018 PET scan   1. Overall, no significant change from PET-CT of 3 months ago. There is persistent hypermetabolic activity at the right thoracic inlet and within a right paratracheal mediastinal node. The nodes have not significantly changed in size, although the metabolic activity has minimally increased over this interval. It is uncertain how much of the thoracic inlet metabolic activity is attributed to the thyroid nodule versus adjacent cervical lymph nodes, both previously biopsied. 2. No new hypermetabolic activity within the neck, chest, abdomen or pelvis. No evidence of local recurrence in the pelvis. 3. Stable probable radiation changes in the right lung.   04/16/2018 PET scan   1. Interval increase in metabolic activity of the RIGHT supraclavicular node and RIGHT lower paratracheal mediastinal node with minimal change in size. Findings consistent with persistent and mildly progressed metastatic adenopathy. 2. New hypermetabolic small lymph node in the RIGHT lower paratracheal nodal station adjacent to the previously followed node. 3. No evidence of local recurrence in the pelvis or new disease elsewhere.   07/30/2018 PET scan   1. Mild interval  progression of right supraclavicular, mediastinal, and right hilar hypermetabolic metastatic disease. There is a new hypermetabolic lymph node in the subcarinal station on today's study. 2. No hypermetabolic disease in the abdomen or pelvis to suggest recurrent/metastases. 3.  Aortic Atherosclerois (ICD10-170.0)     08/16/2018 -  Chemotherapy   The patient had Lenvima since 3/6 and pembrolizumab since 3/13 for chemotherapy treatment. Michel [place] was held temporarily due to infection and severe hypertension   10/31/2018 PET scan   Partial response to therapy, with decreased hypermetabolic lymphadenopathy in mediastinum and right hilar region.   No significant change in hypermetabolic lymphadenopathy in right inferior neck.   No new or progressive metastatic disease identified. No evidence of recurrent or metastatic carcinoma in the pelvis or abdomen   02/04/2019 PET scan   1. Decrease in metabolic activity of RIGHT supraclavicular node and RIGHT paratracheal lymph nodes. 2. Persistent activity in the RIGHT thyroid nodule unchanged. 3. Diffuse activity through the esophagus is favored esophagitis. No change. 4. Scattered foci of intense metabolic activity associated the bowel without focal lesion on CT favored benign. 5. No evidence of peritoneal metastasis. 6. No evidence of metastatic adenopathy in the abdomen pelvis. 7. No evidence of local pelvic sidewall recurrence.   07/30/2019 PET scan   1. Mildly increased uptake within small nodes along the right sternocleidomastoid without change in size and associated with increased deltoid activity may reflect changes related to right arm injection or recent COVID vaccination, consider clinical correlation and attention on follow-up. 2. Persistent activity in the right thyroid nodule, unchanged from previous exams. 3. New activity in a juxta esophageal lymph node in the chest is suspicious though the node is unchanged with respect to size. Endoscopic  correlation could potentially be helpful or close attention on follow-up. 4. Subtle increased FDG uptake along the left mainstem bronchus and adjacent to the aorta may represent a small lymph node. No discrete correlate is demonstrated on today's study.   10/22/2019 PET scan   1. Persistent FDG avid subcentimeter right supraclavicular and juxta esophageal lymph nodes.  The degree of FDG uptake is similar to the previous exam. No new or progressive findings identified. 2. No findings of solid organ metastasis, skeletal metastasis or metastatic disease to the abdomen or pelvis. 3. FDG avid right lobe of thyroid gland nodule is again noted This nodule is not incidental and been worked up previously with 2 biopsies. Please refer to results from the most recent biopsy dated 07/18/2017. (Ref: J Am Coll Radiol. 2015 Feb;12(2): 143-50). 4.  Aortic Atherosclerosis (ICD10-I70.0).   05/05/2020 PET scan   1. Progressive disease, as evidenced by increased hypermetabolism within right supraclavicular and mediastinal nodes. 2. No infradiaphragmatic hypermetabolic metastasis. 3.  Aortic Atherosclerosis (ICD10-I70.0). 4. Hypermetabolic right thyroid nodule has been detailed on prior exams and was biopsied on 07/18/2017   09/10/2020 PET scan   1. Interval improvement in previously demonstrated hypermetabolic mediastinal and right supraclavicular adenopathy. A single hypermetabolic left paraesophageal lymph node remains, improved from previous study. The other nodes have resolved.  2. No progressive disease. 3. Diffuse esophageal hypermetabolic activity suggesting esophagitis. 4. Hypermetabolic right thyroid nodule has been previously biopsied.   03/18/2021 PET scan   1. No significant change in single residual FDG avid left paraesophageal lymph node. This has an SUV max of 7.03, compared with 6.4 previously. No new sites of disease identified. 2. Diffuse esophageal hypermetabolic activity suggestive of  esophagitis. 3. FDG avid right lobe of thyroid gland nodule. This has been biopsied previously. (Ref: J Am Coll Radiol. 2015 Feb;12(2): 143-50).     09/22/2021 PET scan   1. Persistent mild diffuse FDG uptake in the esophagus likely chronic inflammation. 2. Resolution of the left paraesophageal lymph node seen on the prior study. 3. Stable hypermetabolic right thyroid nodule, previously biopsied. 4. No findings for recurrent abdominal/pelvic disease or metastatic disease.     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 Lenvima was held temporarily due to infection and severe hypertension     PHYSICAL EXAMINATION: ECOG PERFORMANCE STATUS: 0 - Asymptomatic  Vitals:   02/23/22 1015  BP: (!) 145/66  Pulse: 68  Resp: 18  SpO2: 100%   Filed Weights   02/23/22 1015  Weight: 155 lb 6.4 oz (70.5 kg)    GENERAL:alert, no distress and comfortable NEURO: alert & oriented x 3 with fluent speech, no focal motor/sensory deficits  LABORATORY DATA:  I have reviewed the data as listed    Component Value Date/Time   NA 137 02/23/2022 0944   NA 141 05/17/2017 0957   K 4.2 02/23/2022 0944   K 3.6 05/17/2017 0957   CL 102 02/23/2022 0944   CL 105 11/19/2012 0848   CO2 31 02/23/2022 0944   CO2 25 05/17/2017 0957   GLUCOSE 92 02/23/2022 0944   GLUCOSE 85 05/17/2017 0957   GLUCOSE 122 (H) 11/19/2012 0848   BUN 22 02/23/2022 0944   BUN 15.2 05/17/2017 0957   CREATININE 0.86 02/23/2022 0944   CREATININE 0.84 03/18/2021 1003   CREATININE 0.8 05/17/2017 0957   CALCIUM 9.7 02/23/2022 0944   CALCIUM 9.6 05/17/2017 0957   PROT 6.9 02/23/2022 0944   PROT 7.0 05/17/2017  0957   ALBUMIN 4.1 02/23/2022 0944  ALBUMIN 3.9 05/17/2017 0957   AST 19 02/23/2022 0944   AST 24 03/18/2021 1003   AST 25 05/17/2017 0957   ALT 13 02/23/2022 0944   ALT 21 03/18/2021 1003   ALT 18 05/17/2017 0957   ALKPHOS 52 02/23/2022 0944   ALKPHOS 57 05/17/2017 0957   BILITOT 0.5 02/23/2022 0944   BILITOT 0.6 03/18/2021 1003   BILITOT 0.47 05/17/2017 0957   GFRNONAA >60 02/23/2022 0944   GFRNONAA >60 03/18/2021 1003   GFRAA >60 02/26/2020 0940   GFRAA >60 07/10/2019 1240    No results found for: "SPEP", "UPEP"  Lab Results  Component Value Date   WBC 3.3 (L) 02/23/2022   NEUTROABS 2.0 02/23/2022   HGB 10.5 (L) 02/23/2022   HCT 31.0 (L) 02/23/2022   MCV 94.2 02/23/2022   PLT 175 02/23/2022      Chemistry      Component Value Date/Time   NA 137 02/23/2022 0944   NA 141 05/17/2017 0957   K 4.2 02/23/2022 0944   K 3.6 05/17/2017 0957   CL 102 02/23/2022 0944   CL 105 11/19/2012 0848   CO2 31 02/23/2022 0944   CO2 25 05/17/2017 0957   BUN 22 02/23/2022 0944   BUN 15.2 05/17/2017 0957   CREATININE 0.86 02/23/2022 0944   CREATININE 0.84 03/18/2021 1003   CREATININE 0.8 05/17/2017 0957   GLU 158 (H) 01/14/2015 1035      Component Value Date/Time   CALCIUM 9.7 02/23/2022 0944   CALCIUM 9.6 05/17/2017 0957   ALKPHOS 52 02/23/2022 0944   ALKPHOS 57 05/17/2017 0957   AST 19 02/23/2022 0944   AST 24 03/18/2021 1003   AST 25 05/17/2017 0957   ALT 13 02/23/2022 0944   ALT 21 03/18/2021 1003   ALT 18 05/17/2017 0957   BILITOT 0.5 02/23/2022 0944   BILITOT 0.6 03/18/2021 1003   BILITOT 0.47 05/17/2017 0957

## 2022-02-23 NOTE — Progress Notes (Signed)
OFF PATHWAY REGIMEN - Uterine  No Change  Continue With Treatment as Ordered.  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

## 2022-02-23 NOTE — Assessment & Plan Note (Signed)
She has intermittent elevated TSH I will adjust her thyroid medicine accordingly 

## 2022-02-23 NOTE — Assessment & Plan Note (Signed)
Her last PET CT showed excellent response to treatment PET CT scan is considered to be normal She will continue maintenance pembrolizumab with Lenvima indefinitely I plan to repeat imaging study again at the end of the year

## 2022-02-23 NOTE — Assessment & Plan Note (Signed)
She has chronic anemia due to anemia chronic illness and chronic leukopenia She also have a history of vitamin B12 deficiency and is giving herself injection once a month Observe closely

## 2022-02-24 ENCOUNTER — Other Ambulatory Visit: Payer: Self-pay

## 2022-03-06 ENCOUNTER — Other Ambulatory Visit: Payer: Self-pay

## 2022-03-07 ENCOUNTER — Other Ambulatory Visit (HOSPITAL_COMMUNITY): Payer: Self-pay

## 2022-03-07 DIAGNOSIS — M25561 Pain in right knee: Secondary | ICD-10-CM | POA: Diagnosis not present

## 2022-03-08 ENCOUNTER — Other Ambulatory Visit: Payer: Self-pay

## 2022-03-13 ENCOUNTER — Other Ambulatory Visit (HOSPITAL_COMMUNITY): Payer: Self-pay

## 2022-03-14 DIAGNOSIS — M25561 Pain in right knee: Secondary | ICD-10-CM | POA: Diagnosis not present

## 2022-03-15 ENCOUNTER — Other Ambulatory Visit: Payer: Self-pay | Admitting: Hematology and Oncology

## 2022-03-16 ENCOUNTER — Other Ambulatory Visit (HOSPITAL_COMMUNITY): Payer: Self-pay

## 2022-03-16 ENCOUNTER — Inpatient Hospital Stay: Payer: 59 | Attending: Hematology and Oncology

## 2022-03-16 ENCOUNTER — Inpatient Hospital Stay: Payer: 59

## 2022-03-16 VITALS — BP 141/80 | HR 74 | Temp 97.5°F | Resp 14 | Wt 159.2 lb

## 2022-03-16 DIAGNOSIS — C541 Malignant neoplasm of endometrium: Secondary | ICD-10-CM | POA: Diagnosis present

## 2022-03-16 DIAGNOSIS — Z79899 Other long term (current) drug therapy: Secondary | ICD-10-CM | POA: Insufficient documentation

## 2022-03-16 DIAGNOSIS — Z95828 Presence of other vascular implants and grafts: Secondary | ICD-10-CM

## 2022-03-16 DIAGNOSIS — Z5112 Encounter for antineoplastic immunotherapy: Secondary | ICD-10-CM | POA: Insufficient documentation

## 2022-03-16 DIAGNOSIS — E039 Hypothyroidism, unspecified: Secondary | ICD-10-CM

## 2022-03-16 DIAGNOSIS — I10 Essential (primary) hypertension: Secondary | ICD-10-CM

## 2022-03-16 LAB — COMPREHENSIVE METABOLIC PANEL
ALT: 15 U/L (ref 0–44)
AST: 22 U/L (ref 15–41)
Albumin: 4.2 g/dL (ref 3.5–5.0)
Alkaline Phosphatase: 52 U/L (ref 38–126)
Anion gap: 5 (ref 5–15)
BUN: 23 mg/dL (ref 8–23)
CO2: 29 mmol/L (ref 22–32)
Calcium: 9.7 mg/dL (ref 8.9–10.3)
Chloride: 100 mmol/L (ref 98–111)
Creatinine, Ser: 0.75 mg/dL (ref 0.44–1.00)
GFR, Estimated: >60 mL/min (ref >60)
Glucose, Bld: 88 mg/dL (ref 70–99)
Potassium: 4.1 mmol/L (ref 3.5–5.1)
Sodium: 134 mmol/L — ABNORMAL LOW (ref 135–145)
Total Bilirubin: 0.5 mg/dL (ref 0.3–1.2)
Total Protein: 7.3 g/dL (ref 6.5–8.1)

## 2022-03-16 LAB — CBC WITH DIFFERENTIAL/PLATELET
Abs Immature Granulocytes: 0.01 10*3/uL (ref 0.00–0.07)
Basophils Absolute: 0.1 10*3/uL (ref 0.0–0.1)
Basophils Relative: 1 %
Eosinophils Absolute: 0.3 10*3/uL (ref 0.0–0.5)
Eosinophils Relative: 7 %
HCT: 31.5 % — ABNORMAL LOW (ref 36.0–46.0)
Hemoglobin: 10.7 g/dL — ABNORMAL LOW (ref 12.0–15.0)
Immature Granulocytes: 0 %
Lymphocytes Relative: 24 %
Lymphs Abs: 1.1 10*3/uL (ref 0.7–4.0)
MCH: 31.7 pg (ref 26.0–34.0)
MCHC: 34 g/dL (ref 30.0–36.0)
MCV: 93.2 fL (ref 80.0–100.0)
Monocytes Absolute: 0.3 10*3/uL (ref 0.1–1.0)
Monocytes Relative: 7 %
Neutro Abs: 2.8 10*3/uL (ref 1.7–7.7)
Neutrophils Relative %: 61 %
Platelets: 191 10*3/uL (ref 150–400)
RBC: 3.38 MIL/uL — ABNORMAL LOW (ref 3.87–5.11)
RDW: 12.9 % (ref 11.5–15.5)
WBC: 4.6 10*3/uL (ref 4.0–10.5)
nRBC: 0 % (ref 0.0–0.2)

## 2022-03-16 LAB — TOTAL PROTEIN, URINE DIPSTICK: Protein, ur: NEGATIVE mg/dL

## 2022-03-16 MED ORDER — SODIUM CHLORIDE 0.9% FLUSH
10.0000 mL | INTRAVENOUS | Status: DC | PRN
  Administered 2022-03-16: 10 mL

## 2022-03-16 MED ORDER — SODIUM CHLORIDE 0.9 % IV SOLN: Freq: Once | INTRAVENOUS | Status: AC

## 2022-03-16 MED ORDER — AMLODIPINE BESYLATE 10 MG PO TABS
10.0000 mg | ORAL_TABLET | Freq: Every day | ORAL | 1 refills | Status: DC
  Filled 2022-03-16: qty 90, 90d supply, fill #0
  Filled 2022-06-22: qty 90, 90d supply, fill #1

## 2022-03-16 MED ORDER — HEPARIN SOD (PORK) LOCK FLUSH 100 UNIT/ML IV SOLN
500.0000 [IU] | Freq: Once | INTRAVENOUS | Status: AC | PRN
  Administered 2022-03-16: 500 [IU]

## 2022-03-16 MED ORDER — SODIUM CHLORIDE 0.9 % IV SOLN
200.0000 mg | Freq: Once | INTRAVENOUS | Status: AC
  Administered 2022-03-16: 200 mg via INTRAVENOUS
  Filled 2022-03-16: qty 200

## 2022-03-16 MED ORDER — SODIUM CHLORIDE 0.9% FLUSH
10.0000 mL | Freq: Once | INTRAVENOUS | Status: AC
  Administered 2022-03-16: 10 mL

## 2022-03-16 NOTE — Patient Instructions (Signed)
[facility]  Discharge Instructions: Thank you for choosing [facility] to provide your oncology and hematology care.   If you have a lab appointment with the Cancer Center, please go directly to the Cancer Center and check in at the registration area.   Wear comfortable clothing and clothing appropriate for easy access to any Portacath or PICC line.   We strive to give you quality time with your provider. You may need to reschedule your appointment if you arrive late (15 or more minutes).  Arriving late affects you and other patients whose appointments are after yours.  Also, if you miss three or more appointments without notifying the office, you may be dismissed from the clinic at the provider's discretion.      For prescription refill requests, have your pharmacy contact our office and allow 72 hours for refills to be completed.    Today you received the following chemotherapy and/or immunotherapy agent: Pembrolizumab (Keytruda)   To help prevent nausea and vomiting after your treatment, we encourage you to take your nausea medication as directed.  BELOW ARE SYMPTOMS THAT SHOULD BE REPORTED IMMEDIATELY: *FEVER GREATER THAN 100.4 F (38 C) OR HIGHER *CHILLS OR SWEATING *NAUSEA AND VOMITING THAT IS NOT CONTROLLED WITH YOUR NAUSEA MEDICATION *UNUSUAL SHORTNESS OF BREATH *UNUSUAL BRUISING OR BLEEDING *URINARY PROBLEMS (pain or burning when urinating, or frequent urination) *BOWEL PROBLEMS (unusual diarrhea, constipation, pain near the anus) TENDERNESS IN MOUTH AND THROAT WITH OR WITHOUT PRESENCE OF ULCERS (sore throat, sores in mouth, or a toothache) UNUSUAL RASH, SWELLING OR PAIN  UNUSUAL VAGINAL DISCHARGE OR ITCHING   Items with * indicate a potential emergency and should be followed up as soon as possible or go to the Emergency Department if any problems should occur.  Please show the CHEMOTHERAPY ALERT CARD or IMMUNOTHERAPY ALERT CARD at  check-in to the Emergency Department and triage nurse.  Should you have questions after your visit or need to cancel or reschedule your appointment, please contact [facility]  Dept: [phone]  and follow the prompts.  Office hours are 8:00 a.m. to 4:30 p.m. Monday - Friday. Please note that voicemails left after 4:00 p.m. may not be returned until the following business day.  We are closed weekends and major holidays. You have access to a nurse at all times for urgent questions. Please call the main number to the clinic Dept: [phone] and follow the prompts.   For any non-urgent questions, you may also contact your provider using MyChart. We now offer e-Visits for anyone 18 and older to request care online for non-urgent symptoms. For details visit [email or web address].   Also download the MyChart app! Go to the app store, search "MyChart", open the app, select [facility], and log in with your MyChart username and password.  Masks are optional in the cancer centers. If you would like for your care team to wear a mask while they are taking care of you, please let them know. You may have one support person who is at least 68 years old accompany you for your appointments. Pembrolizumab Injection What is this medication? PEMBROLIZUMAB (PEM broe LIZ ue mab) treats some types of cancer. It works by helping your immune system slow or stop the spread of cancer cells. It is a monoclonal antibody. This medicine may be used for other purposes; ask your health care provider or pharmacist if you have questions. COMMON BRAND NAME(S): Keytruda What should I tell   my care team before I take this medication? They need to know if you have any of these conditions: Allogeneic stem cell transplant (uses someone else's stem cells) Autoimmune diseases, such as Crohn disease, ulcerative colitis, lupus History of chest radiation Nervous system problems, such as Guillain-Barre  syndrome, myasthenia gravis Organ transplant An unusual or allergic reaction to pembrolizumab, other medications, foods, dyes, or preservatives Pregnant or trying to get pregnant Breast-feeding How should I use this medication? This medication is injected into a vein. It is given by your care team in a hospital or clinic setting. A special MedGuide will be given to you before each treatment. Be sure to read this information carefully each time. Talk to your care team about the use of this medication in children. While it may be prescribed for children as young as 6 months for selected conditions, precautions do apply. Overdosage: If you think you have taken too much of this medicine contact a poison control center or emergency room at once. NOTE: This medicine is only for you. Do not share this medicine with others. What if I miss a dose? Keep appointments for follow-up doses. It is important not to miss your dose. Call your care team if you are unable to keep an appointment. What may interact with this medication? Interactions have not been studied. This list may not describe all possible interactions. Give your health care provider a list of all the medicines, herbs, non-prescription drugs, or dietary supplements you use. Also tell them if you smoke, drink alcohol, or use illegal drugs. Some items may interact with your medicine. What should I watch for while using this medication? Your condition will be monitored carefully while you are receiving this medication. You may need blood work while taking this medication. This medication may cause serious skin reactions. They can happen weeks to months after starting the medication. Contact your care team right away if you notice fevers or flu-like symptoms with a rash. The rash may be red or purple and then turn into blisters or peeling of the skin. You may also notice a red rash with swelling of the face, lips, or lymph nodes in your neck or under  your arms. Tell your care team right away if you have any change in your eyesight. Talk to your care team if you may be pregnant. Serious birth defects can occur if you take this medication during pregnancy and for 4 months after the last dose. You will need a negative pregnancy test before starting this medication. Contraception is recommended while taking this medication and for 4 months after the last dose. Your care team can help you find the option that works for you. Do not breastfeed while taking this medication and for 4 months after the last dose. What side effects may I notice from receiving this medication? Side effects that you should report to your care team as soon as possible: Allergic reactions--skin rash, itching, hives, swelling of the face, lips, tongue, or throat Dry cough, shortness of breath or trouble breathing Eye pain, redness, irritation, or discharge with blurry or decreased vision Heart muscle inflammation--unusual weakness or fatigue, shortness of breath, chest pain, fast or irregular heartbeat, dizziness, swelling of the ankles, feet, or hands Hormone gland problems--headache, sensitivity to light, unusual weakness or fatigue, dizziness, fast or irregular heartbeat, increased sensitivity to cold or heat, excessive sweating, constipation, hair loss, increased thirst or amount of urine, tremors or shaking, irritability Infusion reactions--chest pain, shortness of breath or trouble breathing,   feeling faint or lightheaded Kidney injury (glomerulonephritis)--decrease in the amount of urine, red or dark brown urine, foamy or bubbly urine, swelling of the ankles, hands, or feet Liver injury--right upper belly pain, loss of appetite, nausea, light-colored stool, dark yellow or brown urine, yellowing skin or eyes, unusual weakness or fatigue Pain, tingling, or numbness in the hands or feet, muscle weakness, change in vision, confusion or trouble speaking, loss of balance or  coordination, trouble walking, seizures Rash, fever, and swollen lymph nodes Redness, blistering, peeling, or loosening of the skin, including inside the mouth Sudden or severe stomach pain, bloody diarrhea, fever, nausea, vomiting Side effects that usually do not require medical attention (report to your care team if they continue or are bothersome): Bone, joint, or muscle pain Diarrhea Fatigue Loss of appetite Nausea Skin rash This list may not describe all possible side effects. Call your doctor for medical advice about side effects. You may report side effects to FDA at 1-800-FDA-1088. Where should I keep my medication? This medication is given in a hospital or clinic. It will not be stored at home. NOTE: This sheet is a summary. It may not cover all possible information. If you have questions about this medicine, talk to your doctor, pharmacist, or health care provider.  2023 Elsevier/Gold Standard (2021-09-12 00:00:00)  

## 2022-03-17 LAB — TSH: TSH: 3.04 u[IU]/mL (ref 0.350–4.500)

## 2022-03-21 DIAGNOSIS — M25561 Pain in right knee: Secondary | ICD-10-CM | POA: Diagnosis not present

## 2022-03-30 ENCOUNTER — Other Ambulatory Visit (HOSPITAL_COMMUNITY): Payer: Self-pay

## 2022-04-04 ENCOUNTER — Other Ambulatory Visit (HOSPITAL_COMMUNITY): Payer: Self-pay

## 2022-04-05 DIAGNOSIS — I10 Essential (primary) hypertension: Secondary | ICD-10-CM | POA: Diagnosis not present

## 2022-04-05 DIAGNOSIS — E785 Hyperlipidemia, unspecified: Secondary | ICD-10-CM | POA: Diagnosis not present

## 2022-04-05 DIAGNOSIS — M8580 Other specified disorders of bone density and structure, unspecified site: Secondary | ICD-10-CM | POA: Diagnosis not present

## 2022-04-05 DIAGNOSIS — I700 Atherosclerosis of aorta: Secondary | ICD-10-CM | POA: Diagnosis not present

## 2022-04-05 DIAGNOSIS — E041 Nontoxic single thyroid nodule: Secondary | ICD-10-CM | POA: Diagnosis not present

## 2022-04-05 DIAGNOSIS — D61810 Antineoplastic chemotherapy induced pancytopenia: Secondary | ICD-10-CM | POA: Diagnosis not present

## 2022-04-05 DIAGNOSIS — C541 Malignant neoplasm of endometrium: Secondary | ICD-10-CM | POA: Diagnosis not present

## 2022-04-06 ENCOUNTER — Other Ambulatory Visit: Payer: Self-pay | Admitting: Hematology and Oncology

## 2022-04-06 ENCOUNTER — Telehealth: Payer: Self-pay | Admitting: Hematology and Oncology

## 2022-04-06 ENCOUNTER — Inpatient Hospital Stay: Payer: 59

## 2022-04-06 ENCOUNTER — Encounter: Payer: Self-pay | Admitting: Hematology and Oncology

## 2022-04-06 ENCOUNTER — Inpatient Hospital Stay (HOSPITAL_BASED_OUTPATIENT_CLINIC_OR_DEPARTMENT_OTHER): Payer: 59 | Admitting: Hematology and Oncology

## 2022-04-06 ENCOUNTER — Inpatient Hospital Stay: Payer: 59 | Attending: Hematology and Oncology

## 2022-04-06 VITALS — BP 130/75 | HR 64 | Temp 97.8°F | Resp 18 | Ht 64.0 in | Wt 155.4 lb

## 2022-04-06 DIAGNOSIS — I10 Essential (primary) hypertension: Secondary | ICD-10-CM

## 2022-04-06 DIAGNOSIS — C541 Malignant neoplasm of endometrium: Secondary | ICD-10-CM | POA: Diagnosis present

## 2022-04-06 DIAGNOSIS — C770 Secondary and unspecified malignant neoplasm of lymph nodes of head, face and neck: Secondary | ICD-10-CM | POA: Insufficient documentation

## 2022-04-06 DIAGNOSIS — D61818 Other pancytopenia: Secondary | ICD-10-CM | POA: Diagnosis not present

## 2022-04-06 DIAGNOSIS — Z5112 Encounter for antineoplastic immunotherapy: Secondary | ICD-10-CM | POA: Insufficient documentation

## 2022-04-06 DIAGNOSIS — Z79899 Other long term (current) drug therapy: Secondary | ICD-10-CM | POA: Diagnosis not present

## 2022-04-06 DIAGNOSIS — Z95828 Presence of other vascular implants and grafts: Secondary | ICD-10-CM

## 2022-04-06 LAB — CMP (CANCER CENTER ONLY)
ALT: 14 U/L (ref 0–44)
AST: 20 U/L (ref 15–41)
Albumin: 4 g/dL (ref 3.5–5.0)
Alkaline Phosphatase: 49 U/L (ref 38–126)
Anion gap: 6 (ref 5–15)
BUN: 19 mg/dL (ref 8–23)
CO2: 29 mmol/L (ref 22–32)
Calcium: 9.6 mg/dL (ref 8.9–10.3)
Chloride: 102 mmol/L (ref 98–111)
Creatinine: 0.91 mg/dL (ref 0.44–1.00)
GFR, Estimated: >60 mL/min (ref >60)
Glucose, Bld: 99 mg/dL (ref 70–99)
Potassium: 4 mmol/L (ref 3.5–5.1)
Sodium: 137 mmol/L (ref 135–145)
Total Bilirubin: 0.5 mg/dL (ref 0.3–1.2)
Total Protein: 7.2 g/dL (ref 6.5–8.1)

## 2022-04-06 LAB — CBC WITH DIFFERENTIAL (CANCER CENTER ONLY)
Abs Immature Granulocytes: 0.01 10*3/uL (ref 0.00–0.07)
Basophils Absolute: 0 10*3/uL (ref 0.0–0.1)
Basophils Relative: 1 %
Eosinophils Absolute: 0.3 10*3/uL (ref 0.0–0.5)
Eosinophils Relative: 9 %
HCT: 32.6 % — ABNORMAL LOW (ref 36.0–46.0)
Hemoglobin: 10.8 g/dL — ABNORMAL LOW (ref 12.0–15.0)
Immature Granulocytes: 0 %
Lymphocytes Relative: 24 %
Lymphs Abs: 0.9 10*3/uL (ref 0.7–4.0)
MCH: 31.4 pg (ref 26.0–34.0)
MCHC: 33.1 g/dL (ref 30.0–36.0)
MCV: 94.8 fL (ref 80.0–100.0)
Monocytes Absolute: 0.2 10*3/uL (ref 0.1–1.0)
Monocytes Relative: 6 %
Neutro Abs: 2.2 10*3/uL (ref 1.7–7.7)
Neutrophils Relative %: 60 %
Platelet Count: 204 10*3/uL (ref 150–400)
RBC: 3.44 MIL/uL — ABNORMAL LOW (ref 3.87–5.11)
RDW: 13.3 % (ref 11.5–15.5)
WBC Count: 3.7 10*3/uL — ABNORMAL LOW (ref 4.0–10.5)
nRBC: 0 % (ref 0.0–0.2)

## 2022-04-06 LAB — TOTAL PROTEIN, URINE DIPSTICK: Protein, ur: NEGATIVE mg/dL

## 2022-04-06 LAB — TSH: TSH: 3.962 u[IU]/mL (ref 0.350–4.500)

## 2022-04-06 MED ORDER — SODIUM CHLORIDE 0.9% FLUSH
10.0000 mL | Freq: Once | INTRAVENOUS | Status: AC
  Administered 2022-04-06: 10 mL

## 2022-04-06 MED ORDER — SODIUM CHLORIDE 0.9 % IV SOLN: Freq: Once | INTRAVENOUS | Status: AC

## 2022-04-06 MED ORDER — SODIUM CHLORIDE 0.9 % IV SOLN
200.0000 mg | Freq: Once | INTRAVENOUS | Status: AC
  Administered 2022-04-06: 200 mg via INTRAVENOUS
  Filled 2022-04-06: qty 200

## 2022-04-06 NOTE — Progress Notes (Signed)
[place] OFFICE PROGRESS NOTE  Patient Care Team: [name], MD as PCP - General (Family Medicine) Reynold Bowen, MD as Consulting Physician (Endocrinology)  ASSESSMENT & PLAN:  Endometrial cancer [facility]) Her last PET CT showed excellent response to treatment PET CT scan is considered to be normal She will continue maintenance pembrolizumab with Lenvima indefinitely I plan to repeat imaging study again at the end of the month  Pancytopenia, acquired [facility]) She is not consistent with her B12 injection I reminded her to take her B12 injection on the first day of the month consistently Her pancytopenia is stable  Orders Placed This Encounter  Procedures   NM PET Image Restage (PS) Skull Base to Thigh (F-18 FDG)    Standing Status:   Future    Standing Expiration Date:   04/07/2023    Order Specific Question:   If indicated for the ordered procedure, I authorize the administration of a radiopharmaceutical per Radiology protocol    Answer:   Yes    Order Specific Question:   Preferred imaging location?    Answer:   [facility]    Order Specific Question:   Radiology Contrast Protocol - do NOT remove file path    Answer:   [email or web address]   CBC with Differential (Cancer Center Only)    Standing Status:   Future    Standing Expiration Date:   05/26/2023   CMP ([place] only)    Standing Status:   Future    Standing Expiration Date:   05/26/2023   CBC with Differential ([place] Only)    Standing Status:   Future    Standing Expiration Date:   06/16/2023   CMP ([place] only)    Standing Status:   Future    Standing Expiration Date:   06/16/2023   T4    Standing Status:   Future    Standing Expiration Date:   06/16/2023   TSH    Standing Status:   Future    Standing Expiration Date:   06/16/2023   CBC with Differential ([place] Only)    Standing Status:   Future    Standing Expiration Date:    07/07/2023   CMP ([place] only)    Standing Status:   Future    Standing Expiration Date:   07/07/2023   CBC with Differential ([place] Only)    Standing Status:   Future    Standing Expiration Date:   07/28/2023   CMP ([place] only)    Standing Status:   Future    Standing Expiration Date:   07/28/2023    All questions were answered. The patient knows to call the clinic with any problems, questions or concerns. The total time spent in the appointment was 25 minutes encounter with patients including review of chart and various tests results, discussions about plan of care and coordination of care plan   [name], MD 04/06/2022 11:02 AM  INTERVAL HISTORY: Please see below for problem oriented charting. she returns for treatment follow-up seen prior to her treatment She is doing well Her blood pressure is well controlled Denies new lymphadenopathy  REVIEW OF SYSTEMS:   Constitutional: Denies fevers, chills or abnormal weight loss Eyes: Denies blurriness of vision Ears, nose, mouth, throat, and face: Denies mucositis or sore throat Respiratory: Denies cough, dyspnea or wheezes Cardiovascular: Denies palpitation, chest discomfort or lower extremity swelling Gastrointestinal:  Denies nausea, heartburn or change in bowel habits Skin: Denies abnormal  skin rashes Lymphatics: Denies new lymphadenopathy or easy bruising Neurological:Denies numbness, tingling or new weaknesses Behavioral/Psych: Mood is stable, no new changes  All other systems were reviewed with the patient and are negative.  I have reviewed the past medical history, past surgical history, social history and family history with the patient and they are unchanged from previous note.  ALLERGIES:  is allergic to lenvima [lenvatinib].  MEDICATIONS:  Current Outpatient Medications  Medication Sig Dispense Refill   amLODipine (NORVASC) 10 MG tablet TAKE 1 TABLET BY MOUTH ONCE DAILY. **DECREASE SIMVASTATIN TO  20 MG DAILY WHILE ON AMLODIPINE PER MD** 90 tablet 1   busPIRone (BUSPAR) 10 MG tablet Take 1 tablet by mouth 2 times a day 180 tablet 3   Cholecalciferol (VITAMIN D3) 2000 units TABS Take 2,000 Units by mouth daily.      cyanocobalamin (DODEX) 1000 MCG/ML injection Inject 1 mL into the muscle every 30 days. 1 mL 11   furosemide (LASIX) 20 MG tablet Take 1 tablet (20 mg total) by mouth daily. 30 tablet 0   hydrochlorothiazide (MICROZIDE) 12.5 MG capsule Take 1 capsule (12.5 mg total) by mouth daily. 90 capsule 3   lenvatinib 10 mg daily dose (LENVIMA, 10 MG DAILY DOSE,) capsule TAKE 1 CAPSULE (10 MG TOTAL) BY MOUTH DAILY. 30 each 11   levothyroxine (SYNTHROID) 100 MCG tablet Take 1 tablet (100 mcg total) by mouth daily before breakfast. 60 tablet 1   lidocaine-prilocaine (EMLA) cream APPLY TO AFFECTED AREA ONCE AS DIRECTED 30 g 3   losartan (COZAAR) 25 MG tablet TAKE 1 TABLET BY MOUTH DAILY 90 tablet 3   metoprolol tartrate (LOPRESSOR) 25 MG tablet TAKE 1 TABLET BY MOUTH TWICE DAILY 180 tablet 3   ondansetron (ZOFRAN) 8 MG tablet Take 1 tablet (8 mg total) by mouth every 8 (eight) hours as needed for nausea. 30 tablet 3   pantoprazole (PROTONIX) 40 MG tablet TAKE 1 TABLET BY MOUTH ONCE DAILY 90 tablet 3   simvastatin (ZOCOR) 20 MG tablet Take 1 tablet by mouth daily in the evening. 90 tablet 3   sodium fluoride (PREVIDENT 5000 PLUS) 1.1 % CREA dental cream Use as directed 51 g 12   SYRINGE-NEEDLE, DISP, 3 ML 25G X 5/8" 3 ML MISC Use as directed to administer B12 shot every 30 days. 12 each 0   tacrolimus (PROTOPIC) 0.1 % ointment Apply 1 application topically daily as needed (rash).      tacrolimus (PROTOPIC) 0.1 % ointment Apply to affected area on the skin twice a day 30 g 0   venlafaxine XR (EFFEXOR XR) 150 MG 24 hr capsule Take 2 capsules by mouth in the morning with food 180 capsule 3   No current facility-administered medications for this visit.   Facility-Administered Medications Ordered  in Other Visits  Medication Dose Route Frequency Provider Last Rate Last Admin   0.9 %  sodium chloride infusion   Intravenous Once Alvy Bimler, Sheniece Ruggles, MD       pembrolizumab (KEYTRUDA) 200 mg in sodium chloride 0.9 % 50 mL chemo infusion  200 mg Intravenous Once [name], MD        SUMMARY OF ONCOLOGIC HISTORY: Oncology History Overview Note  [employer] testing done 11-2015 on surgical path from 2014:    MS stable   TMB low  4 muts/mb   ATM R51884   ERBB3 T389K   E2H2 rearrangement exon 9   PPP2R1A P179R   TP53 I195N    ER- APPROXIMATELY 25-35% STAINING  IN NEOPLASTIC CELLS (INTERMEDIATE)  PR- APPROXIMATELY 25-35% STAINING IN NEOPLASTIC CELLS (STRONG)  Repeat biopsy 04/02/17: ER negative, Her 2 negative  Progressed on Doxil   Endometrial cancer ([place])  06/20/2012 Pathology Results   Biopsy positive for papillary serous carcinoma   06/20/2012 Genetic Testing   [employer] One testing done 11-2015 on surgical path from 2014:    MS stable   TMB low  4 muts/mb   ATM [id number]   ERBB3 T389K   E2H2 rearrangement exon 9   PPP2R1A P179R   TP53 I195N    06/20/2012 Initial Diagnosis   Patient presented to PCP with intermittent vaginal bleeding since ~ Oct 2013, endometrial biopsy 06-20-12 with complex endometrial hyperplasia with atypia   06/26/2012 Imaging   Thickened endometrial lining in a postmenopausal patient experiencing vaginal bleeding. In the setting of post-menopausal bleeding, endometrial sampling is indicated to exclude carcinoma. No focal myometrial abnormalities are seen.  Normal left ovary and non-visualized right ovary   07/30/2012 Surgery   Dr. Polly Cobia performed robotic hysterectomy with bilateral salpingo-oophorectomy, bilateral pelvic lymph node dissection and periaortic lymph node dissection. Intraoperatively on frozen section, the patient was noted to have a large endometrial polyp with changes within the polyp consistent for high-grade malignancy, possibly papillary  serous carcinoma. There is no obvious extrauterine disease noted.     07/30/2012 Pathology Results   [phone] SUPPLEMENTAL REPORT  THE ENDOMETRIAL CARCINOMA WAS ANALYZED FOR DNA MISMATCH REPAIR PROTEINS.  IMMUNOHISTOCHEMICALLY, THE NEOPLASM RETAINED NUCLEAR EXPRESSION OF 4 GENE PRODUCTS, MLH1, MSH2, MSH6, AND PMS2, INVOLVED IN DNA MISMATCH REPAIR.  POSITIVE AND NEGATIVE CONTROLS WORKED APPROPRIATELY.  PER REQUEST, AN ER AND PR ARE PERFORMED ON BLOCK 1I.  ER- APPROXIMATELY 25-35% STAINING IN NEOPLASTIC CELLS (INTERMEDIATE)  PR- APPROXIMATELY 25-35% STAINING IN NEOPLASTIC CELLS (STRONG)  ER AND PR PREDOMINANTLY SHOW STAINING IN THE SEROUS COMPONENT.  DIAGNOSIS  1. UTERUS, CERVIX WITH BILATERAL FALLOPIAN TUBES AND OVARIES,  HYSTERECTOMY AND BILATERAL SALPINGO-OOPHORECTOMY:  HIGH GRADE/POORLY DIFFERENTIATED ENDOMETRIAL ADENOCARCINOMA WITH SOLID AND SEROUS PAPILLARY COMPONENTS.  HISTOPATHOLOGIC TYPE:    A VARIETY OF PATTERNS WERE PRESENT, ALL OF WHICH SHOULD BE CONSIDERED TO BE HIGH GRADE.  SEROUS PAPILLARY CARCINOMA WAS SEEN OCCURRING ADJACENT TO SOLID ENDOMETRIAL CARCINOMA WITH MARKED ANAPLASIA AND GIANT CELLS. SIZE: TUMOR MEASURED AT LEAST 4.8 CM IN GREATEST HORIZONTAL DIMENSION.  GRADE: POORLY DIFFERENTIATED OR HIGH GRADE. DEPTH OF INVASION: NO DEFINITE MYOMETRIAL INVOLVEMENT WAS SEEN.  THE TUMOR APPEARED CONFINED TO THE POLYP AS WELL AS SURFACE ENDOMETRIUM. WHERE MYOMETRIAL THICKNESS NOT APPLICABLE. SEROSAL INVOLVEMENT:       NOT DEMONSTRATED.  ENDOCERVICAL INVOLVEMENT:  NOT DEMONSTRATED. RESECTION MARGINS:         FREE OF INVOLVEMENT. EXTRAUTERINE EXTENSION:    NOT DEMONSTRATED. ANGIOLYMPHATIC INVASION:   NOT DEFINITELY SEEN. TOTAL NODES EXAMINED:      22.    PELVIC NODES EXAMINED:  20.    PELVIC NODES INVOLVED:  0.    PARA-AORTIC NODES       2. EXAMINED:    PARA-AORTIC NODES       0. INVOLVED TNM STAGE:                 T1A N0 MX. AJCC STAGE GROUPING:       IA. FIGO STAGE:                 IA.   08/26/2012 Imaging   No CT evidence for intra-abdominal or pelvic metastatic disease. Trace free pelvic fluid, presumably postoperative although the date of  surgery is not documented in the electronic medical record.   08/26/2012 - 12/13/2012 Chemotherapy   She received 6 cycles of carbo/taxol    10/29/2012 - 11/27/2012 Radiation Therapy   May 27, June 5,  June 11, June 19, November 27, 2012: Proximal vagina 30 Gy in 5 fractions      01/17/2013 Imaging   1.  No evidence of recurrent or metastatic disease. 2.  No acute abnormality involving the abdomen or pelvis. 3.  Mild diffuse hepatic steatosis. 4.  Very small supraumbilical midline anterior abdominal wall hernia containing fat, unchanged   01/22/2014 Imaging   No evidence for metastatic or recurrent disease. 2. No bowel obstruction.  Normal appendix. 3. Small fat containing hernia, stable in appearance. 4. Status post hysterectomy and bilateral oophorectomy   02/19/2014 Imaging   No pulmonary lesions are identified. The abnormality on the chest x-ray is due to asymmetric left-sided sternoclavicular joint degenerative disease   10/12/2014 Imaging   New mild retroperitoneal lymphadenopathy in the left paraaortic region and proximal left common iliac chain, consistent with metastatic disease. No other sites of metastatic disease identified within the abdomen or pelvis.       11/27/2014 - 02/11/2015 Chemotherapy   She received 4 cycles of carbo/taxol    03/01/2015 PET scan   Single hypermetabolic small retroperitoneal lymph node along the aorta. 2. No evidence of metastatic disease otherwise in the abdomen or pelvis. No evidence local recurrence. 3. Intensely hypermetabolic enlarged nodule adjacent to the RIGHT lobe of thyroid gland. This presumably represents the biopsied lesion in clinician report which was found to be benign thyroid tissue.   04/05/2015 - 05/13/2015 Radiation Therapy   She received 50.4 gray in 28 fractions with  simultaneous integrated boost to 56 gray   06/17/2015 Imaging   No acute process or evidence of metastatic disease in the abdomen or pelvis. Resolution of previously described retroperitoneal adenopathy. 2.  Possible constipation. 3. Atherosclerosis.   06/17/2015 Tumor Marker   Patient's tumor was tested for the following markers: CA125 Results of the tumor marker test revealed 33   08/12/2015 Tumor Marker   Patient's tumor was tested for the following markers: CA125 Results of the tumor marker test revealed 52   08/31/2015 PET scan   Development of right paratracheal hypermetabolic adenopathy, consistent with nodal metastasis. 2. The previously described isolated abdominal retroperitoneal hypermetabolic node has resolved. 3. Persistent hypermetabolic right thyroid nodule, per report previously biopsied. Correlate with those results. 4.  Possible constipation.   09/09/2015 -  Anti-estrogen oral therapy   She has been receiving alternative treatment between megace and tamoxifen   09/09/2015 Tumor Marker   Patient's tumor was tested for the following markers: CA125 Results of the tumor marker test revealed 37.8   09/20/2015 Procedure   Technically successful ultrasound-guided thyroid aspiration biopsy , dominant right nodule   09/20/2015 Pathology Results   THYROID, RIGHT, FINE NEEDLE ASPIRATION (SPECIMEN 1 OF 1 COLLECTED 09/20/15): FINDINGS CONSISTENT WITH BENIGN THYROID NODULE (BETHESDA CATEGORY II).   10/28/2015 Tumor Marker   Patient's tumor was tested for the following markers: CA125 Results of the tumor marker test revealed 53.2   11/04/2015 Imaging   Stable benign right thyroid nodule   12/16/2015 Tumor Marker   Patient's tumor was tested for the following markers: CA125 Results of the tumor marker test revealed 38.1   03/22/2016 PET scan   Interval progression of hypermetabolic right paratracheal lymph node consistent with metastatic involvement. 2. Stable hypermetabolic right thyroid  nodule. Reportedly  this has been biopsied in the past.   03/23/2016 Tumor Marker   Patient's tumor was tested for the following markers: CA125 Results of the tumor marker test revealed 62.4   04/13/2016 - 06/01/2016 Radiation Therapy   She received 56 Gy to the chest in 28 fractions    05/09/2016 Imaging   No evidence of left lower extremity deep vein thrombosis. No evidence of a superficial thrombosis of the greater and lesser saphenous veins. Positive for thrombus noted in several varicosities of the calf. No evidence of Baker's cyst on the left.   05/17/2016 Tumor Marker   Patient's tumor was tested for the following markers: CA125 Results of the tumor marker test revealed 9   06/29/2016 Tumor Marker   Patient's tumor was tested for the following markers: CA125 Results of the tumor marker test revealed 5.9   08/04/2016 Tumor Marker   Patient's tumor was tested for the following markers: CA125 Results of the tumor marker test revealed 6.0   09/12/2016 PET scan   Complete metabolic response to therapy, with resolution of hypermetabolic mediastinal lymphadenopathy since prior exam. No residual or new metastatic disease identified. Stable hypermetabolic right thyroid lobe nodule, which was previously biopsied on 09/20/2015.   03/13/2017 PET scan   1. Solitary focus of recurrent right paratracheal hypermetabolic activity, with a 1.0 cm right lower paratracheal node having a maximum SUV of 9.0. Appearance compatible with recurrent malignancy. 2. Continued hypermetabolic right thyroid nodule, previously biopsied, presumed benign -correlate with prior biopsy results. 3. Other imaging findings of potential clinical significance: Aortic Atherosclerosis (ICD10-I70.0). Mild cardiomegaly. Prominent stool throughout the colon favors constipation.   04/02/2017 Pathology Results   FINE NEEDLE ASPIRATION, ENDOSCOPIC, (EBUS) 4 R NODE (SPECIMEN 1 OF 2 COLLECTED 04/02/17): MALIGNANT CELLS PRESENT,  CONSISTENT WITH CARCINOMA. SEE COMMENT. COMMENT: THE MALIGNANT CELLS ARE POSITIVE FOR P53 AND NEGATIVE FOR ESTROGEN RECEPTOR AND TTF-1. THIS PROFILE IS NON-SPECIFIC, BUT THE P53 POSITIVE STAINING IS SUGGESTIVE OF GYNECOLOGIC PRIMARY.    04/11/2017 Procedure   Successful 8 French right internal jugular vein power port placement with its tip at the SVC/RA junction   04/12/2017 Imaging   Normal LV size with mild LV hypertrophy. EF 55-60%. Normal RV size and systolic function. Aortic valve sclerosis without significant stenosis.   04/18/2017 - 06/14/2017 Chemotherapy   The patient had chemotherapy with Doxil    07/10/2017 Imaging   - Left ventricle: The cavity size was normal. There was mild concentric hypertrophy. Systolic function was normal. The estimated ejection fraction was in the range of 55% to 60%. Wall motion was normal; there were no regional wall motion abnormalities. Doppler parameters are consistent with abnormal left ventricular relaxation (grade 1 diastolic dysfunction). - Aortic valve: Noncoronary cusp mobility was mildly restricted. - Mitral valve: There was mild regurgitation. - Left atrium: The atrium was mildly dilated. - Atrial septum: No defect or patent foramen ovale was identified.  Impressions:  - Compared to November 2018, global LV longitudinal strain remains normal (has increased)   07/11/2017 PET scan   Increased size and hypermetabolic activity of 3.3 cm right thyroid lobe nodule. Thyroid carcinoma cannot be excluded. Recommend repeat ultrasound guided fine needle aspiration to exclude thyroid carcinoma.  New adjacent hypermetabolic 10 mm right supraclavicular lymph node, suspicious for lymph node metastasis.  Slight increase in size and hypermetabolic activity of solitary right paratracheal lymph node.  No evidence of abdominal or pelvic metastatic disease.   07/18/2017 Procedure   1. Technically successful ultrasound guided fine needle  aspiration of  indeterminate hypermetabolic right-sided thyroid nodule/mass. 2. Technically successful ultrasound-guided core needle biopsy of hypermetabolic right lower cervical lymph node.   07/18/2017 Pathology Results   THYROID, FINE NEEDLE ASPIRATION RIGHT (SPECIMEN 1 OF 1, COLLECTED ON 07/18/2017): ATYPIA OF UNDETERMINED SIGNIFICANCE OR FOLLICULAR LESION OF UNDETERMINED SIGNIFICANCE (BETHESDA CATEGORY III). SEE COMMENT. COMMENT: THE SPECIMEN CONSISTS OF SMALL AND MEDIUM SIZED GROUPS OF FOLLICULAR EPITHELIAL CELLS WITH MILD CYTOLOGIC ATYPIA INCLUDING NUCLEAR ENLARGEMENT AND HURTHLE CELL CHANGE. SOME GROUPS ARE ARRANGED AS MICROFOLLICLES. THERE IS MINIMAL BACKGROUND COLLOID. BASED ON THESE FEATURES, A FOLLICULAR LESION/NEOPLASM CAN NOT BE ENTIRELY RULED OUT. A SPECIMEN WILL BE SENT FOR AFIRMA TESTING.   07/19/2017 Pathology Results   Lymph node, needle/core biopsy - METASTATIC PAPILLARY SEROUS CARCINOMA. - SEE COMMENT. Microscopic Comment Dr. Vicente Males has reviewed the case and concurs with this interpretation   10/15/2017 PET scan   No new or progressive disease. No evidence of abdominal or pelvic metastatic disease.  Stable hypermetabolic right thyroid lobe nodule and adjacent right supraclavicular lymph node.  Decreased size and hypermetabolic activity of solitary right paratracheal lymph node.   01/23/2018 PET scan   1. Overall, no significant change from PET-CT of 3 months ago. There is persistent hypermetabolic activity at the right thoracic inlet and within a right paratracheal mediastinal node. The nodes have not significantly changed in size, although the metabolic activity has minimally increased over this interval. It is uncertain how much of the thoracic inlet metabolic activity is attributed to the thyroid nodule versus adjacent cervical lymph nodes, both previously biopsied. 2. No new hypermetabolic activity within the neck, chest, abdomen or pelvis. No evidence of local recurrence in the  pelvis. 3. Stable probable radiation changes in the right lung.   04/16/2018 PET scan   1. Interval increase in metabolic activity of the RIGHT supraclavicular node and RIGHT lower paratracheal mediastinal node with minimal change in size. Findings consistent with persistent and mildly progressed metastatic adenopathy. 2. New hypermetabolic small lymph node in the RIGHT lower paratracheal nodal station adjacent to the previously followed node. 3. No evidence of local recurrence in the pelvis or new disease elsewhere.   07/30/2018 PET scan   1. Mild interval progression of right supraclavicular, mediastinal, and right hilar hypermetabolic metastatic disease. There is a new hypermetabolic lymph node in the subcarinal station on today's study. 2. No hypermetabolic disease in the abdomen or pelvis to suggest recurrent/metastases. 3.  Aortic Atherosclerois (ICD10-170.0)     08/16/2018 -  Chemotherapy   The patient had Lenvima since 3/6 and pembrolizumab since 3/13 for chemotherapy treatment. Michel Santee was held temporarily due to infection and severe hypertension   10/31/2018 PET scan   Partial response to therapy, with decreased hypermetabolic lymphadenopathy in mediastinum and right hilar region.   No significant change in hypermetabolic lymphadenopathy in right inferior neck.   No new or progressive metastatic disease identified. No evidence of recurrent or metastatic carcinoma in the pelvis or abdomen   02/04/2019 PET scan   1. Decrease in metabolic activity of RIGHT supraclavicular node and RIGHT paratracheal lymph nodes. 2. Persistent activity in the RIGHT thyroid nodule unchanged. 3. Diffuse activity through the esophagus is favored esophagitis. No change. 4. Scattered foci of intense metabolic activity associated the bowel without focal lesion on CT favored benign. 5. No evidence of peritoneal metastasis. 6. No evidence of metastatic adenopathy in the abdomen pelvis. 7. No evidence of  local pelvic sidewall recurrence.   07/30/2019 PET scan   1. Mildly  increased uptake within small nodes along the right sternocleidomastoid without change in size and associated with increased deltoid activity may reflect changes related to right arm injection or recent COVID vaccination, consider clinical correlation and attention on follow-up. 2. Persistent activity in the right thyroid nodule, unchanged from previous exams. 3. New activity in a juxta esophageal lymph node in the chest is suspicious though the node is unchanged with respect to size. Endoscopic correlation could potentially be helpful or close attention on follow-up. 4. Subtle increased FDG uptake along the left mainstem bronchus and adjacent to the aorta may represent a small lymph node. No discrete correlate is demonstrated on today's study.   10/22/2019 PET scan   1. Persistent FDG avid subcentimeter right supraclavicular and juxta esophageal lymph nodes. The degree of FDG uptake is similar to the previous exam. No new or progressive findings identified. 2. No findings of solid organ metastasis, skeletal metastasis or metastatic disease to the abdomen or pelvis. 3. FDG avid right lobe of thyroid gland nodule is again noted This nodule is not incidental and been worked up previously with 2 biopsies. Please refer to results from the most recent biopsy dated 07/18/2017. (Ref: J Am Coll Radiol. 2015 Feb;12(2): 143-50). 4.  Aortic Atherosclerosis (ICD10-I70.0).   05/05/2020 PET scan   1. Progressive disease, as evidenced by increased hypermetabolism within right supraclavicular and mediastinal nodes. 2. No infradiaphragmatic hypermetabolic metastasis. 3.  Aortic Atherosclerosis (ICD10-I70.0). 4. Hypermetabolic right thyroid nodule has been detailed on prior exams and was biopsied on 07/18/2017   09/10/2020 PET scan   1. Interval improvement in previously demonstrated hypermetabolic mediastinal and right supraclavicular adenopathy. A  single hypermetabolic left paraesophageal lymph node remains, improved from previous study. The other nodes have resolved.  2. No progressive disease. 3. Diffuse esophageal hypermetabolic activity suggesting esophagitis. 4. Hypermetabolic right thyroid nodule has been previously biopsied.   03/18/2021 PET scan   1. No significant change in single residual FDG avid left paraesophageal lymph node. This has an SUV max of 7.03, compared with 6.4 previously. No new sites of disease identified. 2. Diffuse esophageal hypermetabolic activity suggestive of esophagitis. 3. FDG avid right lobe of thyroid gland nodule. This has been biopsied previously. (Ref: J Am Coll Radiol. 2015 Feb;12(2): 143-50).     09/22/2021 PET scan   1. Persistent mild diffuse FDG uptake in the esophagus likely chronic inflammation. 2. Resolution of the left paraesophageal lymph node seen on the prior study. 3. Stable hypermetabolic right thyroid nodule, previously biopsied. 4. No findings for recurrent abdominal/pelvic disease or metastatic disease.     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 Lenvima was held temporarily due to infection and severe hypertension     PHYSICAL EXAMINATION: ECOG PERFORMANCE STATUS: 0 - Asymptomatic  Vitals:   04/06/22 1014  BP: 130/75  Pulse: 64  Resp: 18  Temp: 97.8 F (36.6 C)  SpO2: 98%   Filed Weights   04/06/22 1014  Weight: 155 lb 6.4 oz (70.5 kg)    GENERAL:alert, no distress and comfortable NEURO: alert & oriented x 3 with fluent speech, no focal motor/sensory deficits  LABORATORY DATA:  I have reviewed the data as  listed    Component Value  Date/Time   NA 137 04/06/2022 0921   NA 141 05/17/2017 0957   K 4.0 04/06/2022 0921   K 3.6 05/17/2017 0957   CL 102 04/06/2022 0921   CL 105 11/19/2012 0848   CO2 29 04/06/2022 0921   CO2 25 05/17/2017 0957   GLUCOSE 99 04/06/2022 0921   GLUCOSE 85 05/17/2017 0957   GLUCOSE 122 (H) 11/19/2012 0848   BUN 19 04/06/2022 0921   BUN 15.2 05/17/2017 0957   CREATININE 0.91 04/06/2022 0921   CREATININE 0.8 05/17/2017 0957   CALCIUM 9.6 04/06/2022 0921   CALCIUM 9.6 05/17/2017 0957   PROT 7.2 04/06/2022 0921   PROT 7.0 05/17/2017 0957   ALBUMIN 4.0 04/06/2022 0921   ALBUMIN 3.9 05/17/2017 0957   AST 20 04/06/2022 0921   AST 25 05/17/2017 0957   ALT 14 04/06/2022 0921   ALT 18 05/17/2017 0957   ALKPHOS 49 04/06/2022 0921   ALKPHOS 57 05/17/2017 0957   BILITOT 0.5 04/06/2022 0921   BILITOT 0.47 05/17/2017 0957   GFRNONAA >60 04/06/2022 0921   GFRAA >60 02/26/2020 0940   GFRAA >60 07/10/2019 1240    No results found for: "SPEP", "UPEP"  Lab Results  Component Value Date   WBC 3.7 (L) 04/06/2022   NEUTROABS 2.2 04/06/2022   HGB 10.8 (L) 04/06/2022   HCT 32.6 (L) 04/06/2022   MCV 94.8 04/06/2022   PLT 204 04/06/2022      Chemistry      Component Value Date/Time   NA 137 04/06/2022 0921   NA 141 05/17/2017 0957   K 4.0 04/06/2022 0921   K 3.6 05/17/2017 0957   CL 102 04/06/2022 0921   CL 105 11/19/2012 0848   CO2 29 04/06/2022 0921   CO2 25 05/17/2017 0957   BUN 19 04/06/2022 0921   BUN 15.2 05/17/2017 0957   CREATININE 0.91 04/06/2022 0921   CREATININE 0.8 05/17/2017 0957   GLU 158 (H) 01/14/2015 1035      Component Value Date/Time   CALCIUM 9.6 04/06/2022 0921   CALCIUM 9.6 05/17/2017 0957   ALKPHOS 49 04/06/2022 0921   ALKPHOS 57 05/17/2017 0957   AST 20 04/06/2022 0921   AST 25 05/17/2017 0957   ALT 14 04/06/2022 0921   ALT 18 05/17/2017 0957   BILITOT 0.5 04/06/2022 0921   BILITOT 0.47 05/17/2017 0957

## 2022-04-06 NOTE — Telephone Encounter (Signed)
Scheduled appointments per 11/2 los. Left voicemail.

## 2022-04-06 NOTE — Assessment & Plan Note (Signed)
Her last PET CT showed excellent response to treatment PET CT scan is considered to be normal She will continue maintenance pembrolizumab with Lenvima indefinitely I plan to repeat imaging study again at the end of the month

## 2022-04-06 NOTE — Patient Instructions (Signed)
[facility]  Discharge Instructions: Thank you for choosing [facility] to provide your oncology and hematology care.   If you have a lab appointment with the Cancer Center, please go directly to the Cancer Center and check in at the registration area.   Wear comfortable clothing and clothing appropriate for easy access to any Portacath or PICC line.   We strive to give you quality time with your provider. You may need to reschedule your appointment if you arrive late (15 or more minutes).  Arriving late affects you and other patients whose appointments are after yours.  Also, if you miss three or more appointments without notifying the office, you may be dismissed from the clinic at the provider's discretion.      For prescription refill requests, have your pharmacy contact our office and allow 72 hours for refills to be completed.    Today you received the following chemotherapy and/or immunotherapy agents keytruda      To help prevent nausea and vomiting after your treatment, we encourage you to take your nausea medication as directed.  BELOW ARE SYMPTOMS THAT SHOULD BE REPORTED IMMEDIATELY: *FEVER GREATER THAN 100.4 F (38 C) OR HIGHER *CHILLS OR SWEATING *NAUSEA AND VOMITING THAT IS NOT CONTROLLED WITH YOUR NAUSEA MEDICATION *UNUSUAL SHORTNESS OF BREATH *UNUSUAL BRUISING OR BLEEDING *URINARY PROBLEMS (pain or burning when urinating, or frequent urination) *BOWEL PROBLEMS (unusual diarrhea, constipation, pain near the anus) TENDERNESS IN MOUTH AND THROAT WITH OR WITHOUT PRESENCE OF ULCERS (sore throat, sores in mouth, or a toothache) UNUSUAL RASH, SWELLING OR PAIN  UNUSUAL VAGINAL DISCHARGE OR ITCHING   Items with * indicate a potential emergency and should be followed up as soon as possible or go to the Emergency Department if any problems should occur.  Please show the CHEMOTHERAPY ALERT CARD or IMMUNOTHERAPY ALERT CARD at check-in to  the Emergency Department and triage nurse.  Should you have questions after your visit or need to cancel or reschedule your appointment, please contact [facility]  Dept: [phone]  and follow the prompts.  Office hours are 8:00 a.m. to 4:30 p.m. Monday - Friday. Please note that voicemails left after 4:00 p.m. may not be returned until the following business day.  We are closed weekends and major holidays. You have access to a nurse at all times for urgent questions. Please call the main number to the clinic Dept: [phone] and follow the prompts.   For any non-urgent questions, you may also contact your provider using MyChart. We now offer e-Visits for anyone 18 and older to request care online for non-urgent symptoms. For details visit [email or web address].   Also download the MyChart app! Go to the app store, search "MyChart", open the app, select [facility], and log in with your MyChart username and password.  Masks are optional in the cancer centers. If you would like for your care team to wear a mask while they are taking care of you, please let them know. You may have one support person who is at least 68 years old accompany you for your appointments. 

## 2022-04-06 NOTE — Assessment & Plan Note (Signed)
She is not consistent with her B12 injection I reminded her to take her B12 injection on the first day of the month consistently Her pancytopenia is stable

## 2022-04-07 ENCOUNTER — Other Ambulatory Visit: Payer: Self-pay

## 2022-04-07 ENCOUNTER — Telehealth: Payer: Self-pay

## 2022-04-07 LAB — T4: T4, Total: 8.5 ug/dL (ref 4.5–12.0)

## 2022-04-07 NOTE — Telephone Encounter (Signed)
Returned her call and left a message. She left a message regarding a outstanding bill from 2 months ago of over [phone]. Sent a message to prior authorization person and financial person. Left message that I would call her back when I find out something. Left the # for billing.

## 2022-04-11 ENCOUNTER — Other Ambulatory Visit: Payer: Self-pay

## 2022-04-11 DIAGNOSIS — M25561 Pain in right knee: Secondary | ICD-10-CM | POA: Diagnosis not present

## 2022-04-13 ENCOUNTER — Other Ambulatory Visit (HOSPITAL_COMMUNITY): Payer: Self-pay

## 2022-04-19 ENCOUNTER — Other Ambulatory Visit (HOSPITAL_COMMUNITY): Payer: Self-pay

## 2022-04-24 ENCOUNTER — Other Ambulatory Visit (HOSPITAL_COMMUNITY): Payer: Self-pay

## 2022-05-01 ENCOUNTER — Encounter (HOSPITAL_COMMUNITY): Payer: 59

## 2022-05-03 ENCOUNTER — Encounter (HOSPITAL_COMMUNITY)
Admission: RE | Admit: 2022-05-03 | Discharge: 2022-05-03 | Disposition: A | Discharge status: Home / Self Care | Payer: 59 | Source: Ambulatory Visit | Attending: Hematology and Oncology | Admitting: Hematology and Oncology

## 2022-05-03 DIAGNOSIS — C541 Malignant neoplasm of endometrium: Secondary | ICD-10-CM | POA: Diagnosis not present

## 2022-05-03 LAB — GLUCOSE, CAPILLARY: Glucose-Capillary: 96 mg/dL (ref 70–99)

## 2022-05-03 MED ORDER — FLUDEOXYGLUCOSE F - 18 (FDG) INJECTION
7.5000 | Freq: Once | INTRAVENOUS | Status: AC | PRN
  Administered 2022-05-03: 7.5 via INTRAVENOUS

## 2022-05-05 ENCOUNTER — Inpatient Hospital Stay: Payer: 59

## 2022-05-05 ENCOUNTER — Inpatient Hospital Stay: Payer: 59 | Attending: Hematology and Oncology

## 2022-05-05 ENCOUNTER — Encounter: Payer: Self-pay | Admitting: Hematology and Oncology

## 2022-05-05 ENCOUNTER — Inpatient Hospital Stay (HOSPITAL_BASED_OUTPATIENT_CLINIC_OR_DEPARTMENT_OTHER): Payer: 59 | Admitting: Hematology and Oncology

## 2022-05-05 VITALS — BP 120/63 | HR 87 | Temp 97.8°F | Resp 16 | Wt 153.5 lb

## 2022-05-05 DIAGNOSIS — E538 Deficiency of other specified B group vitamins: Secondary | ICD-10-CM | POA: Insufficient documentation

## 2022-05-05 DIAGNOSIS — C541 Malignant neoplasm of endometrium: Secondary | ICD-10-CM | POA: Insufficient documentation

## 2022-05-05 DIAGNOSIS — Z79899 Other long term (current) drug therapy: Secondary | ICD-10-CM | POA: Diagnosis not present

## 2022-05-05 DIAGNOSIS — K2100 Gastro-esophageal reflux disease with esophagitis, without bleeding: Secondary | ICD-10-CM

## 2022-05-05 DIAGNOSIS — D61818 Other pancytopenia: Secondary | ICD-10-CM | POA: Insufficient documentation

## 2022-05-05 DIAGNOSIS — I10 Essential (primary) hypertension: Secondary | ICD-10-CM

## 2022-05-05 DIAGNOSIS — Z5112 Encounter for antineoplastic immunotherapy: Secondary | ICD-10-CM | POA: Insufficient documentation

## 2022-05-05 DIAGNOSIS — Z95828 Presence of other vascular implants and grafts: Secondary | ICD-10-CM

## 2022-05-05 DIAGNOSIS — E039 Hypothyroidism, unspecified: Secondary | ICD-10-CM

## 2022-05-05 LAB — CBC WITH DIFFERENTIAL (CANCER CENTER ONLY)
Abs Immature Granulocytes: 0.01 10*3/uL (ref 0.00–0.07)
Basophils Absolute: 0 10*3/uL (ref 0.0–0.1)
Basophils Relative: 1 %
Eosinophils Absolute: 0.3 10*3/uL (ref 0.0–0.5)
Eosinophils Relative: 8 %
HCT: 33 % — ABNORMAL LOW (ref 36.0–46.0)
Hemoglobin: 10.8 g/dL — ABNORMAL LOW (ref 12.0–15.0)
Immature Granulocytes: 0 %
Lymphocytes Relative: 22 %
Lymphs Abs: 0.7 10*3/uL (ref 0.7–4.0)
MCH: 31.7 pg (ref 26.0–34.0)
MCHC: 32.7 g/dL (ref 30.0–36.0)
MCV: 96.8 fL (ref 80.0–100.0)
Monocytes Absolute: 0.4 10*3/uL (ref 0.1–1.0)
Monocytes Relative: 11 %
Neutro Abs: 2 10*3/uL (ref 1.7–7.7)
Neutrophils Relative %: 58 %
Platelet Count: 171 10*3/uL (ref 150–400)
RBC: 3.41 MIL/uL — ABNORMAL LOW (ref 3.87–5.11)
RDW: 13.8 % (ref 11.5–15.5)
WBC Count: 3.4 10*3/uL — ABNORMAL LOW (ref 4.0–10.5)
nRBC: 0 % (ref 0.0–0.2)

## 2022-05-05 LAB — CMP (CANCER CENTER ONLY)
ALT: 14 U/L (ref 0–44)
AST: 20 U/L (ref 15–41)
Albumin: 4.1 g/dL (ref 3.5–5.0)
Alkaline Phosphatase: 45 U/L (ref 38–126)
Anion gap: 5 (ref 5–15)
BUN: 26 mg/dL — ABNORMAL HIGH (ref 8–23)
CO2: 28 mmol/L (ref 22–32)
Calcium: 9.5 mg/dL (ref 8.9–10.3)
Chloride: 102 mmol/L (ref 98–111)
Creatinine: 1.07 mg/dL — ABNORMAL HIGH (ref 0.44–1.00)
GFR, Estimated: 57 mL/min — ABNORMAL LOW (ref >60)
Glucose, Bld: 95 mg/dL (ref 70–99)
Potassium: 4 mmol/L (ref 3.5–5.1)
Sodium: 135 mmol/L (ref 135–145)
Total Bilirubin: 0.5 mg/dL (ref 0.3–1.2)
Total Protein: 7.2 g/dL (ref 6.5–8.1)

## 2022-05-05 LAB — TOTAL PROTEIN, URINE DIPSTICK: Protein, ur: <30 mg/dL — AB

## 2022-05-05 LAB — TSH: TSH: 4.219 u[IU]/mL (ref 0.350–4.500)

## 2022-05-05 MED ORDER — SODIUM CHLORIDE 0.9% FLUSH
10.0000 mL | INTRAVENOUS | Status: DC | PRN
  Administered 2022-05-05: 10 mL

## 2022-05-05 MED ORDER — SODIUM CHLORIDE 0.9 % IV SOLN: Freq: Once | INTRAVENOUS | Status: AC

## 2022-05-05 MED ORDER — HEPARIN SOD (PORK) LOCK FLUSH 100 UNIT/ML IV SOLN
500.0000 [IU] | Freq: Once | INTRAVENOUS | Status: AC | PRN
  Administered 2022-05-05: 500 [IU]

## 2022-05-05 MED ORDER — SODIUM CHLORIDE 0.9% FLUSH
10.0000 mL | Freq: Once | INTRAVENOUS | Status: AC
  Administered 2022-05-05: 10 mL

## 2022-05-05 MED ORDER — SODIUM CHLORIDE 0.9 % IV SOLN
200.0000 mg | Freq: Once | INTRAVENOUS | Status: AC
  Administered 2022-05-05: 200 mg via INTRAVENOUS
  Filled 2022-05-05: qty 200

## 2022-05-05 NOTE — Assessment & Plan Note (Signed)
Her pancytopenia is stable

## 2022-05-05 NOTE — Progress Notes (Signed)
Per Dr. Alvy Bimler OK to proceed with tx without CMP result today

## 2022-05-05 NOTE — Assessment & Plan Note (Signed)
This is noted on PET/CT imaging but she is not very much symptomatic She will continue pantoprazole in the morning and I recommend adding Pepcid in the evening She does not feel strongly she needs to be evaluated by EGD

## 2022-05-05 NOTE — Progress Notes (Signed)
[facility] OFFICE PROGRESS NOTE  Patient Care Team: [name], MD as PCP - General (Family Medicine) Reynold Bowen, MD as Consulting Physician (Endocrinology)  ASSESSMENT & PLAN:  Endometrial cancer [facility]) Her PET CT scan is normal Previously noted left paraesophageal lymphadenopathy that was previously treated with radiation therapy has normalized since April of this year I recommend we continue surveillance imaging study every 6 months and if she has no signs of disease progression in April 2025, we can consider discontinuation of systemic treatment She is in agreement with the plan of care  Esophagitis, reflux This is noted on PET/CT imaging but she is not very much symptomatic She will continue pantoprazole in the morning and I recommend adding Pepcid in the evening She does not feel strongly she needs to be evaluated by EGD  Vitamin B12 deficiency She will continue monthly vitamin B12 injection  Pancytopenia, acquired ([place]) Her pancytopenia is stable  No orders of the defined types were placed in this encounter.   All questions were answered. The patient knows to call the clinic with any problems, questions or concerns. The total time spent in the appointment was 30 minutes encounter with patients including review of chart and various tests results, discussions about plan of care and coordination of care plan   [name], MD 05/05/2022 10:46 AM  INTERVAL HISTORY: Please see below for problem oriented charting. she returns for treatment follow-up seen in the infusion room Appointment is delayed due to poor issues She has no side effects from treatment She has occasional reflux symptoms She is delighted to see excellent response to therapy on recent PET/CT imaging  REVIEW OF SYSTEMS:   Constitutional: Denies fevers, chills or abnormal weight loss Eyes: Denies blurriness of vision Ears, nose, mouth, throat, and face: Denies mucositis or sore  throat Respiratory: Denies cough, dyspnea or wheezes Cardiovascular: Denies palpitation, chest discomfort or lower extremity swelling Skin: Denies abnormal skin rashes Lymphatics: Denies new lymphadenopathy or easy bruising Neurological:Denies numbness, tingling or new weaknesses Behavioral/Psych: Mood is stable, no new changes  All other systems were reviewed with the patient and are negative.  I have reviewed the past medical history, past surgical history, social history and family history with the patient and they are unchanged from previous note.  ALLERGIES:  is allergic to lenvima [lenvatinib].  MEDICATIONS:  Current Outpatient Medications  Medication Sig Dispense Refill   amLODipine (NORVASC) 10 MG tablet TAKE 1 TABLET BY MOUTH ONCE DAILY. **DECREASE SIMVASTATIN TO 20 MG DAILY WHILE ON AMLODIPINE PER MD** 90 tablet 1   busPIRone (BUSPAR) 10 MG tablet Take 1 tablet by mouth 2 times a day 180 tablet 3   Cholecalciferol (VITAMIN D3) 2000 units TABS Take 2,000 Units by mouth daily.      cyanocobalamin (DODEX) 1000 MCG/ML injection Inject 1 mL into the muscle every 30 days. 1 mL 11   hydrochlorothiazide (MICROZIDE) 12.5 MG capsule Take 1 capsule (12.5 mg total) by mouth daily. 90 capsule 3   lenvatinib 10 mg daily dose (LENVIMA, 10 MG DAILY DOSE,) capsule TAKE 1 CAPSULE (10 MG TOTAL) BY MOUTH DAILY. 30 each 11   levothyroxine (SYNTHROID) 100 MCG tablet Take 1 tablet (100 mcg total) by mouth daily before breakfast. 60 tablet 1   lidocaine-prilocaine (EMLA) cream APPLY TO AFFECTED AREA ONCE AS DIRECTED 30 g 3   losartan (COZAAR) 25 MG tablet TAKE 1 TABLET BY MOUTH DAILY 90 tablet 3   metoprolol tartrate (LOPRESSOR) 25 MG tablet TAKE 1 TABLET  BY MOUTH TWICE DAILY 180 tablet 3   ondansetron (ZOFRAN) 8 MG tablet Take 1 tablet (8 mg total) by mouth every 8 (eight) hours as needed for nausea. 30 tablet 3   pantoprazole (PROTONIX) 40 MG tablet TAKE 1 TABLET BY MOUTH ONCE DAILY 90 tablet 3    simvastatin (ZOCOR) 20 MG tablet Take 1 tablet by mouth daily in the evening. 90 tablet 3   sodium fluoride (PREVIDENT 5000 PLUS) 1.1 % CREA dental cream Use as directed 51 g 12   SYRINGE-NEEDLE, DISP, 3 ML 25G X 5/8" 3 ML MISC Use as directed to administer B12 shot every 30 days. 12 each 0   tacrolimus (PROTOPIC) 0.1 % ointment Apply to affected area on the skin twice a day 30 g 0   venlafaxine XR (EFFEXOR XR) 150 MG 24 hr capsule Take 2 capsules by mouth in the morning with food 180 capsule 3   No current facility-administered medications for this visit.   Facility-Administered Medications Ordered in Other Visits  Medication Dose Route Frequency Provider Last Rate Last Admin   heparin lock flush 100 unit/mL  500 Units Intracatheter Once PRN Alvy Bimler, Maygen Sirico, MD       pembrolizumab (KEYTRUDA) 200 mg in sodium chloride 0.9 % 50 mL chemo infusion  200 mg Intravenous Once Alvy Bimler, Zoya Sprecher, MD       sodium chloride flush (NS) 0.9 % injection 10 mL  10 mL Intracatheter PRN [name], MD        SUMMARY OF ONCOLOGIC HISTORY: Oncology History Overview Note  [facility] testing done 11-2015 on surgical path from 2014:    MS stable   TMB low  4 muts/mb   ATM V76160   ERBB3 T389K   E2H2 rearrangement exon 9   PPP2R1A P179R   TP53 I195N    ER- APPROXIMATELY 25-35% STAINING IN NEOPLASTIC CELLS (INTERMEDIATE)  PR- APPROXIMATELY 25-35% STAINING IN NEOPLASTIC CELLS (STRONG)  Repeat biopsy 04/02/17: ER negative, Her 2 negative  Progressed on Doxil   Endometrial cancer ([place])  06/20/2012 Pathology Results   Biopsy positive for papillary serous carcinoma   06/20/2012 Genetic Testing   [facility] One testing done 11-2015 on surgical path from 2014:    MS stable   TMB low  4 muts/mb   ATM [id number]   ERBB3 T389K   E2H2 rearrangement exon 9   PPP2R1A P179R   TP53 I195N    06/20/2012 Initial Diagnosis   Patient presented to PCP with intermittent vaginal bleeding since ~ Oct 2013, endometrial biopsy  06-20-12 with complex endometrial hyperplasia with atypia   06/26/2012 Imaging   Thickened endometrial lining in a postmenopausal patient experiencing vaginal bleeding. In the setting of post-menopausal bleeding, endometrial sampling is indicated to exclude carcinoma. No focal myometrial abnormalities are seen.  Normal left ovary and non-visualized right ovary   07/30/2012 Surgery   Dr. Polly Cobia performed robotic hysterectomy with bilateral salpingo-oophorectomy, bilateral pelvic lymph node dissection and periaortic lymph node dissection. Intraoperatively on frozen section, the patient was noted to have a large endometrial polyp with changes within the polyp consistent for high-grade malignancy, possibly papillary serous carcinoma. There is no obvious extrauterine disease noted.     07/30/2012 Pathology Results   [phone] SUPPLEMENTAL REPORT  THE ENDOMETRIAL CARCINOMA WAS ANALYZED FOR DNA MISMATCH REPAIR PROTEINS.  IMMUNOHISTOCHEMICALLY, THE NEOPLASM RETAINED NUCLEAR EXPRESSION OF 4 GENE PRODUCTS, MLH1, MSH2, MSH6, AND PMS2, INVOLVED IN DNA MISMATCH REPAIR.  POSITIVE AND NEGATIVE CONTROLS WORKED APPROPRIATELY.  PER REQUEST, AN ER AND PR  ARE PERFORMED ON BLOCK 1I.  ER- APPROXIMATELY 25-35% STAINING IN NEOPLASTIC CELLS (INTERMEDIATE)  PR- APPROXIMATELY 25-35% STAINING IN NEOPLASTIC CELLS (STRONG)  ER AND PR PREDOMINANTLY SHOW STAINING IN THE SEROUS COMPONENT.  DIAGNOSIS  1. UTERUS, CERVIX WITH BILATERAL FALLOPIAN TUBES AND OVARIES,  HYSTERECTOMY AND BILATERAL SALPINGO-OOPHORECTOMY:  HIGH GRADE/POORLY DIFFERENTIATED ENDOMETRIAL ADENOCARCINOMA WITH SOLID AND SEROUS PAPILLARY COMPONENTS.  HISTOPATHOLOGIC TYPE:    A VARIETY OF PATTERNS WERE PRESENT, ALL OF WHICH SHOULD BE CONSIDERED TO BE HIGH GRADE.  SEROUS PAPILLARY CARCINOMA WAS SEEN OCCURRING ADJACENT TO SOLID ENDOMETRIAL CARCINOMA WITH MARKED ANAPLASIA AND GIANT CELLS. SIZE: TUMOR MEASURED AT LEAST 4.8 CM IN GREATEST HORIZONTAL DIMENSION.   GRADE: POORLY DIFFERENTIATED OR HIGH GRADE. DEPTH OF INVASION: NO DEFINITE MYOMETRIAL INVOLVEMENT WAS SEEN.  THE TUMOR APPEARED CONFINED TO THE POLYP AS WELL AS SURFACE ENDOMETRIUM. WHERE MYOMETRIAL THICKNESS NOT APPLICABLE. SEROSAL INVOLVEMENT:       NOT DEMONSTRATED.  ENDOCERVICAL INVOLVEMENT:  NOT DEMONSTRATED. RESECTION MARGINS:         FREE OF INVOLVEMENT. EXTRAUTERINE EXTENSION:    NOT DEMONSTRATED. ANGIOLYMPHATIC INVASION:   NOT DEFINITELY SEEN. TOTAL NODES EXAMINED:      22.    PELVIC NODES EXAMINED:  20.    PELVIC NODES INVOLVED:  0.    PARA-AORTIC NODES       2. EXAMINED:    PARA-AORTIC NODES       0. INVOLVED TNM STAGE:                 T1A N0 MX. AJCC STAGE GROUPING:       IA. FIGO STAGE:                IA.   08/26/2012 Imaging   No CT evidence for intra-abdominal or pelvic metastatic disease. Trace free pelvic fluid, presumably postoperative although the date of surgery is not documented in the electronic medical record.   08/26/2012 - 12/13/2012 Chemotherapy   She received 6 cycles of carbo/taxol    10/29/2012 - 11/27/2012 Radiation Therapy   May 27, June 5,  June 11, June 19, November 27, 2012: Proximal vagina 30 Gy in 5 fractions      01/17/2013 Imaging   1.  No evidence of recurrent or metastatic disease. 2.  No acute abnormality involving the abdomen or pelvis. 3.  Mild diffuse hepatic steatosis. 4.  Very small supraumbilical midline anterior abdominal wall hernia containing fat, unchanged   01/22/2014 Imaging   No evidence for metastatic or recurrent disease. 2. No bowel obstruction.  Normal appendix. 3. Small fat containing hernia, stable in appearance. 4. Status post hysterectomy and bilateral oophorectomy   02/19/2014 Imaging   No pulmonary lesions are identified. The abnormality on the chest x-ray is due to asymmetric left-sided sternoclavicular joint degenerative disease   10/12/2014 Imaging   New mild retroperitoneal lymphadenopathy in the left paraaortic region  and proximal left common iliac chain, consistent with metastatic disease. No other sites of metastatic disease identified within the abdomen or pelvis.       11/27/2014 - 02/11/2015 Chemotherapy   She received 4 cycles of carbo/taxol    03/01/2015 PET scan   Single hypermetabolic small retroperitoneal lymph node along the aorta. 2. No evidence of metastatic disease otherwise in the abdomen or pelvis. No evidence local recurrence. 3. Intensely hypermetabolic enlarged nodule adjacent to the RIGHT lobe of thyroid gland. This presumably represents the biopsied lesion in clinician report which was found to be benign thyroid tissue.   04/05/2015 -  05/13/2015 Radiation Therapy   She received 50.4 gray in 28 fractions with simultaneous integrated boost to 56 gray   06/17/2015 Imaging   No acute process or evidence of metastatic disease in the abdomen or pelvis. Resolution of previously described retroperitoneal adenopathy. 2.  Possible constipation. 3. Atherosclerosis.   06/17/2015 Tumor Marker   Patient's tumor was tested for the following markers: CA125 Results of the tumor marker test revealed 33   08/12/2015 Tumor Marker   Patient's tumor was tested for the following markers: CA125 Results of the tumor marker test revealed 52   08/31/2015 PET scan   Development of right paratracheal hypermetabolic adenopathy, consistent with nodal metastasis. 2. The previously described isolated abdominal retroperitoneal hypermetabolic node has resolved. 3. Persistent hypermetabolic right thyroid nodule, per report previously biopsied. Correlate with those results. 4.  Possible constipation.   09/09/2015 -  Anti-estrogen oral therapy   She has been receiving alternative treatment between megace and tamoxifen   09/09/2015 Tumor Marker   Patient's tumor was tested for the following markers: CA125 Results of the tumor marker test revealed 37.8   09/20/2015 Procedure   Technically successful ultrasound-guided thyroid  aspiration biopsy , dominant right nodule   09/20/2015 Pathology Results   THYROID, RIGHT, FINE NEEDLE ASPIRATION (SPECIMEN 1 OF 1 COLLECTED 09/20/15): FINDINGS CONSISTENT WITH BENIGN THYROID NODULE (BETHESDA CATEGORY II).   10/28/2015 Tumor Marker   Patient's tumor was tested for the following markers: CA125 Results of the tumor marker test revealed 53.2   11/04/2015 Imaging   Stable benign right thyroid nodule   12/16/2015 Tumor Marker   Patient's tumor was tested for the following markers: CA125 Results of the tumor marker test revealed 38.1   03/22/2016 PET scan   Interval progression of hypermetabolic right paratracheal lymph node consistent with metastatic involvement. 2. Stable hypermetabolic right thyroid nodule. Reportedly this has been biopsied in the past.   03/23/2016 Tumor Marker   Patient's tumor was tested for the following markers: CA125 Results of the tumor marker test revealed 62.4   04/13/2016 - 06/01/2016 Radiation Therapy   She received 56 Gy to the chest in 28 fractions    05/09/2016 Imaging   No evidence of left lower extremity deep vein thrombosis. No evidence of a superficial thrombosis of the greater and lesser saphenous veins. Positive for thrombus noted in several varicosities of the calf. No evidence of Baker's cyst on the left.   05/17/2016 Tumor Marker   Patient's tumor was tested for the following markers: CA125 Results of the tumor marker test revealed 9   06/29/2016 Tumor Marker   Patient's tumor was tested for the following markers: CA125 Results of the tumor marker test revealed 5.9   08/04/2016 Tumor Marker   Patient's tumor was tested for the following markers: CA125 Results of the tumor marker test revealed 6.0   09/12/2016 PET scan   Complete metabolic response to therapy, with resolution of hypermetabolic mediastinal lymphadenopathy since prior exam. No residual or new metastatic disease identified. Stable hypermetabolic right thyroid lobe  nodule, which was previously biopsied on 09/20/2015.   03/13/2017 PET scan   1. Solitary focus of recurrent right paratracheal hypermetabolic activity, with a 1.0 cm right lower paratracheal node having a maximum SUV of 9.0. Appearance compatible with recurrent malignancy. 2. Continued hypermetabolic right thyroid nodule, previously biopsied, presumed benign -correlate with prior biopsy results. 3. Other imaging findings of potential clinical significance: Aortic Atherosclerosis (ICD10-I70.0). Mild cardiomegaly. Prominent stool throughout the colon favors constipation.   04/02/2017  Pathology Results   FINE NEEDLE ASPIRATION, ENDOSCOPIC, (EBUS) 4 R NODE (SPECIMEN 1 OF 2 COLLECTED 04/02/17): MALIGNANT CELLS PRESENT, CONSISTENT WITH CARCINOMA. SEE COMMENT. COMMENT: THE MALIGNANT CELLS ARE POSITIVE FOR P53 AND NEGATIVE FOR ESTROGEN RECEPTOR AND TTF-1. THIS PROFILE IS NON-SPECIFIC, BUT THE P53 POSITIVE STAINING IS SUGGESTIVE OF GYNECOLOGIC PRIMARY.    04/11/2017 Procedure   Successful 8 French right internal jugular vein power port placement with its tip at the SVC/RA junction   04/12/2017 Imaging   Normal LV size with mild LV hypertrophy. EF 55-60%. Normal RV size and systolic function. Aortic valve sclerosis without significant stenosis.   04/18/2017 - 06/14/2017 Chemotherapy   The patient had chemotherapy with Doxil    07/10/2017 Imaging   - Left ventricle: The cavity size was normal. There was mild concentric hypertrophy. Systolic function was normal. The estimated ejection fraction was in the range of 55% to 60%. Wall motion was normal; there were no regional wall motion abnormalities. Doppler parameters are consistent with abnormal left ventricular relaxation (grade 1 diastolic dysfunction). - Aortic valve: Noncoronary cusp mobility was mildly restricted. - Mitral valve: There was mild regurgitation. - Left atrium: The atrium was mildly dilated. - Atrial septum: No defect or patent foramen  ovale was identified.  Impressions:  - Compared to November 2018, global LV longitudinal strain remains normal (has increased)   07/11/2017 PET scan   Increased size and hypermetabolic activity of 3.3 cm right thyroid lobe nodule. Thyroid carcinoma cannot be excluded. Recommend repeat ultrasound guided fine needle aspiration to exclude thyroid carcinoma.  New adjacent hypermetabolic 10 mm right supraclavicular lymph node, suspicious for lymph node metastasis.  Slight increase in size and hypermetabolic activity of solitary right paratracheal lymph node.  No evidence of abdominal or pelvic metastatic disease.   07/18/2017 Procedure   1. Technically successful ultrasound guided fine needle aspiration of indeterminate hypermetabolic right-sided thyroid nodule/mass. 2. Technically successful ultrasound-guided core needle biopsy of hypermetabolic right lower cervical lymph node.   07/18/2017 Pathology Results   THYROID, FINE NEEDLE ASPIRATION RIGHT (SPECIMEN 1 OF 1, COLLECTED ON 07/18/2017): ATYPIA OF UNDETERMINED SIGNIFICANCE OR FOLLICULAR LESION OF UNDETERMINED SIGNIFICANCE (BETHESDA CATEGORY III). SEE COMMENT. COMMENT: THE SPECIMEN CONSISTS OF SMALL AND MEDIUM SIZED GROUPS OF FOLLICULAR EPITHELIAL CELLS WITH MILD CYTOLOGIC ATYPIA INCLUDING NUCLEAR ENLARGEMENT AND HURTHLE CELL CHANGE. SOME GROUPS ARE ARRANGED AS MICROFOLLICLES. THERE IS MINIMAL BACKGROUND COLLOID. BASED ON THESE FEATURES, A FOLLICULAR LESION/NEOPLASM CAN NOT BE ENTIRELY RULED OUT. A SPECIMEN WILL BE SENT FOR AFIRMA TESTING.   07/19/2017 Pathology Results   Lymph node, needle/core biopsy - METASTATIC PAPILLARY SEROUS CARCINOMA. - SEE COMMENT. Microscopic Comment Dr. Vicente Males has reviewed the case and concurs with this interpretation   10/15/2017 PET scan   No new or progressive disease. No evidence of abdominal or pelvic metastatic disease.  Stable hypermetabolic right thyroid lobe nodule and adjacent right supraclavicular  lymph node.  Decreased size and hypermetabolic activity of solitary right paratracheal lymph node.   01/23/2018 PET scan   1. Overall, no significant change from PET-CT of 3 months ago. There is persistent hypermetabolic activity at the right thoracic inlet and within a right paratracheal mediastinal node. The nodes have not significantly changed in size, although the metabolic activity has minimally increased over this interval. It is uncertain how much of the thoracic inlet metabolic activity is attributed to the thyroid nodule versus adjacent cervical lymph nodes, both previously biopsied. 2. No new hypermetabolic activity within the neck, chest, abdomen or pelvis. No  evidence of local recurrence in the pelvis. 3. Stable probable radiation changes in the right lung.   04/16/2018 PET scan   1. Interval increase in metabolic activity of the RIGHT supraclavicular node and RIGHT lower paratracheal mediastinal node with minimal change in size. Findings consistent with persistent and mildly progressed metastatic adenopathy. 2. New hypermetabolic small lymph node in the RIGHT lower paratracheal nodal station adjacent to the previously followed node. 3. No evidence of local recurrence in the pelvis or new disease elsewhere.   07/30/2018 PET scan   1. Mild interval progression of right supraclavicular, mediastinal, and right hilar hypermetabolic metastatic disease. There is a new hypermetabolic lymph node in the subcarinal station on today's study. 2. No hypermetabolic disease in the abdomen or pelvis to suggest recurrent/metastases. 3.  Aortic Atherosclerois (ICD10-170.0)     08/16/2018 -  Chemotherapy   The patient had Lenvima since 3/6 and pembrolizumab since 3/13 for chemotherapy treatment. Michel Santee was held temporarily due to infection and severe hypertension   10/31/2018 PET scan   Partial response to therapy, with decreased hypermetabolic lymphadenopathy in mediastinum and right hilar region.    No significant change in hypermetabolic lymphadenopathy in right inferior neck.   No new or progressive metastatic disease identified. No evidence of recurrent or metastatic carcinoma in the pelvis or abdomen   02/04/2019 PET scan   1. Decrease in metabolic activity of RIGHT supraclavicular node and RIGHT paratracheal lymph nodes. 2. Persistent activity in the RIGHT thyroid nodule unchanged. 3. Diffuse activity through the esophagus is favored esophagitis. No change. 4. Scattered foci of intense metabolic activity associated the bowel without focal lesion on CT favored benign. 5. No evidence of peritoneal metastasis. 6. No evidence of metastatic adenopathy in the abdomen pelvis. 7. No evidence of local pelvic sidewall recurrence.   07/30/2019 PET scan   1. Mildly increased uptake within small nodes along the right sternocleidomastoid without change in size and associated with increased deltoid activity may reflect changes related to right arm injection or recent COVID vaccination, consider clinical correlation and attention on follow-up. 2. Persistent activity in the right thyroid nodule, unchanged from previous exams. 3. New activity in a juxta esophageal lymph node in the chest is suspicious though the node is unchanged with respect to size. Endoscopic correlation could potentially be helpful or close attention on follow-up. 4. Subtle increased FDG uptake along the left mainstem bronchus and adjacent to the aorta may represent a small lymph node. No discrete correlate is demonstrated on today's study.   10/22/2019 PET scan   1. Persistent FDG avid subcentimeter right supraclavicular and juxta esophageal lymph nodes. The degree of FDG uptake is similar to the previous exam. No new or progressive findings identified. 2. No findings of solid organ metastasis, skeletal metastasis or metastatic disease to the abdomen or pelvis. 3. FDG avid right lobe of thyroid gland nodule is again noted This  nodule is not incidental and been worked up previously with 2 biopsies. Please refer to results from the most recent biopsy dated 07/18/2017. (Ref: J Am Coll Radiol. 2015 Feb;12(2): 143-50). 4.  Aortic Atherosclerosis (ICD10-I70.0).   05/05/2020 PET scan   1. Progressive disease, as evidenced by increased hypermetabolism within right supraclavicular and mediastinal nodes. 2. No infradiaphragmatic hypermetabolic metastasis. 3.  Aortic Atherosclerosis (ICD10-I70.0). 4. Hypermetabolic right thyroid nodule has been detailed on prior exams and was biopsied on 07/18/2017   09/10/2020 PET scan   1. Interval improvement in previously demonstrated hypermetabolic mediastinal and right supraclavicular adenopathy. A  single hypermetabolic left paraesophageal lymph node remains, improved from previous study. The other nodes have resolved.  2. No progressive disease. 3. Diffuse esophageal hypermetabolic activity suggesting esophagitis. 4. Hypermetabolic right thyroid nodule has been previously biopsied.   03/18/2021 PET scan   1. No significant change in single residual FDG avid left paraesophageal lymph node. This has an SUV max of 7.03, compared with 6.4 previously. No new sites of disease identified. 2. Diffuse esophageal hypermetabolic activity suggestive of esophagitis. 3. FDG avid right lobe of thyroid gland nodule. This has been biopsied previously. (Ref: J Am Coll Radiol. 2015 Feb;12(2): 143-50).     09/22/2021 PET scan   1. Persistent mild diffuse FDG uptake in the esophagus likely chronic inflammation. 2. Resolution of the left paraesophageal lymph node seen on the prior study. 3. Stable hypermetabolic right thyroid nodule, previously biopsied. 4. No findings for recurrent abdominal/pelvic disease or metastatic disease.     02/23/2022 -  Chemotherapy   Patient is on Treatment Plan : UTERINE Lenvatinib (20) D1-21 + Pembrolizumab (200) D1 q21d     05/04/2022 PET scan   1. Status post  hysterectomy without evidence of hypermetabolic local recurrence or hypermetabolic metastatic disease. 2. Persistent diffuse long segment hypermetabolic esophageal activity, likely reflects chronic esophagitis. Consider further evaluation with endoscopy if not previously performed. 3.  Aortic Atherosclerosis (ICD10-I70.0).     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 Lenvima was held temporarily due to infection and severe hypertension     PHYSICAL EXAMINATION: ECOG PERFORMANCE STATUS: 0 - Asymptomatic GENERAL:alert, no distress and comfortable NEURO: alert & oriented x 3 with fluent speech, no focal motor/sensory deficits  LABORATORY DATA:  I have reviewed the data as listed    Component Value Date/Time   NA 135 05/05/2022 0948   NA 141 05/17/2017 0957   K 4.0 05/05/2022 0948   K 3.6 05/17/2017 0957   CL 102 05/05/2022 0948   CL 105 11/19/2012 0848   CO2 28 05/05/2022 0948   CO2 25 05/17/2017 0957   GLUCOSE 95 05/05/2022 0948   GLUCOSE 85 05/17/2017 0957   GLUCOSE 122 (H) 11/19/2012 0848   BUN 26 (H) 05/05/2022 0948   BUN 15.2 05/17/2017 0957   CREATININE 1.07 (H) 05/05/2022 0948   CREATININE 0.8 05/17/2017 0957   CALCIUM 9.5 05/05/2022 0948   CALCIUM 9.6 05/17/2017 0957   PROT 7.2 05/05/2022 0948   PROT 7.0 05/17/2017 0957   ALBUMIN 4.1 05/05/2022 0948   ALBUMIN 3.9 05/17/2017 0957   AST 20 05/05/2022 0948   AST 25 05/17/2017 0957   ALT 14 05/05/2022 0948   ALT 18 05/17/2017 0957   ALKPHOS 45 05/05/2022 0948   ALKPHOS 57 05/17/2017 0957   BILITOT 0.5 05/05/2022 0948   BILITOT 0.47 05/17/2017 0957   GFRNONAA 57 (L) 05/05/2022 0948   GFRAA >60 02/26/2020 0940   GFRAA >60 07/10/2019 1240    No results found for: "SPEP", "UPEP"  Lab Results  Component Value  Date   WBC 3.4 (L) 05/05/2022   NEUTROABS 2.0 05/05/2022   HGB 10.8 (L) 05/05/2022   HCT 33.0 (L) 05/05/2022   MCV 96.8 05/05/2022   PLT 171 05/05/2022      Chemistry      Component Value Date/Time   NA 135 05/05/2022 0948   NA  141 05/17/2017 0957   K 4.0 05/05/2022 0948   K 3.6 05/17/2017 0957   CL 102 05/05/2022 0948   CL 105 11/19/2012 0848   CO2 28 05/05/2022 0948   CO2 25 05/17/2017 0957   BUN 26 (H) 05/05/2022 0948   BUN 15.2 05/17/2017 0957   CREATININE 1.07 (H) 05/05/2022 0948   CREATININE 0.8 05/17/2017 0957   GLU 158 (H) 01/14/2015 1035      Component Value Date/Time   CALCIUM 9.5 05/05/2022 0948   CALCIUM 9.6 05/17/2017 0957   ALKPHOS 45 05/05/2022 0948   ALKPHOS 57 05/17/2017 0957   AST 20 05/05/2022 0948   AST 25 05/17/2017 0957   ALT 14 05/05/2022 0948   ALT 18 05/17/2017 0957   BILITOT 0.5 05/05/2022 0948   BILITOT 0.47 05/17/2017 0957       RADIOGRAPHIC STUDIES: I have personally reviewed the radiological images as listed and agreed with the findings in the report. NM PET Image Restage (PS) Skull Base to Thigh (F-18 FDG)  Result Date: 05/04/2022 CLINICAL DATA:  Subsequent treatment strategy for endometrial carcinoma status post hysterectomy on Keytruda assess treatment response. EXAM: NUCLEAR MEDICINE PET SKULL BASE TO THIGH TECHNIQUE: 7.7 mCi F-18 FDG was injected intravenously. Full-ring PET imaging was performed from the skull base to thigh after the radiotracer. CT data was obtained and used for attenuation correction and anatomic localization. Fasting blood glucose: 96 mg/dl COMPARISON:  Multiple priors including most recent PET-CT September 21, 2021. FINDINGS: Mediastinal blood pool activity: SUV max 1.7 Liver activity: SUV max NA NECK: No hypermetabolic cervical adenopathy. Similar slightly asymmetric metabolic uptake in the left parotid gland possibly related to chronic inflammation. Stable previously biopsied hypermetabolic right thyroid nodule with  a max SUV of 11.9 previously 14.3. Incidental CT findings: None. CHEST: Persistent diffuse long segment hypermetabolic esophageal activity with a max SUV of 5.9. No hypermetabolic thoracic adenopathy. No hypermetabolic pulmonary nodules or masses. Incidental CT findings: Right chest wall Port-A-Cath with tip at the superior cavoatrial junction. Aortic atherosclerosis. Coronary artery calcifications. ABDOMEN/PELVIS: No abnormal hypermetabolic activity within the liver, pancreas, adrenal glands, or spleen. No hypermetabolic lymph nodes in the abdomen or pelvis. Incidental CT findings: Prior hysterectomy without suspicious hypermetabolic nodularity along the vaginal cuff. Aortic atherosclerosis. SKELETON: No focal hypermetabolic activity to suggest skeletal metastasis. Incidental CT findings: Multilevel degenerative changes spine with mild multifocal degenerative joint disease. IMPRESSION: 1. Status post hysterectomy without evidence of hypermetabolic local recurrence or hypermetabolic metastatic disease. 2. Persistent diffuse long segment hypermetabolic esophageal activity, likely reflects chronic esophagitis. Consider further evaluation with endoscopy if not previously performed. 3.  Aortic Atherosclerosis (ICD10-I70.0). Electronically Signed   By: Dahlia Bailiff M.D.   On: 05/04/2022 10:41

## 2022-05-05 NOTE — Patient Instructions (Signed)
[facility]  Discharge Instructions: Thank you for choosing [facility] to provide your oncology and hematology care.   If you have a lab appointment with the Cancer Center, please go directly to the Cancer Center and check in at the registration area.   Wear comfortable clothing and clothing appropriate for easy access to any Portacath or PICC line.   We strive to give you quality time with your provider. You may need to reschedule your appointment if you arrive late (15 or more minutes).  Arriving late affects you and other patients whose appointments are after yours.  Also, if you miss three or more appointments without notifying the office, you may be dismissed from the clinic at the provider's discretion.      For prescription refill requests, have your pharmacy contact our office and allow 72 hours for refills to be completed.    Today you received the following chemotherapy and/or immunotherapy agents: Keytruda.       To help prevent nausea and vomiting after your treatment, we encourage you to take your nausea medication as directed.  BELOW ARE SYMPTOMS THAT SHOULD BE REPORTED IMMEDIATELY: *FEVER GREATER THAN 100.4 F (38 C) OR HIGHER *CHILLS OR SWEATING *NAUSEA AND VOMITING THAT IS NOT CONTROLLED WITH YOUR NAUSEA MEDICATION *UNUSUAL SHORTNESS OF BREATH *UNUSUAL BRUISING OR BLEEDING *URINARY PROBLEMS (pain or burning when urinating, or frequent urination) *BOWEL PROBLEMS (unusual diarrhea, constipation, pain near the anus) TENDERNESS IN MOUTH AND THROAT WITH OR WITHOUT PRESENCE OF ULCERS (sore throat, sores in mouth, or a toothache) UNUSUAL RASH, SWELLING OR PAIN  UNUSUAL VAGINAL DISCHARGE OR ITCHING   Items with * indicate a potential emergency and should be followed up as soon as possible or go to the Emergency Department if any problems should occur.  Please show the CHEMOTHERAPY ALERT CARD or IMMUNOTHERAPY ALERT CARD at check-in to  the Emergency Department and triage nurse.  Should you have questions after your visit or need to cancel or reschedule your appointment, please contact [facility]  Dept: [phone]  and follow the prompts.  Office hours are 8:00 a.m. to 4:30 p.m. Monday - Friday. Please note that voicemails left after 4:00 p.m. may not be returned until the following business day.  We are closed weekends and major holidays. You have access to a nurse at all times for urgent questions. Please call the main number to the clinic Dept: [phone] and follow the prompts.   For any non-urgent questions, you may also contact your provider using MyChart. We now offer e-Visits for anyone 18 and older to request care online for non-urgent symptoms. For details visit [email or web address].   Also download the MyChart app! Go to the app store, search "MyChart", open the app, select [facility], and log in with your MyChart username and password.  Masks are optional in the cancer centers. If you would like for your care team to wear a mask while they are taking care of you, please let them know. You may have one support person who is at least 68 years old accompany you for your appointments. 

## 2022-05-05 NOTE — Assessment & Plan Note (Signed)
Her PET CT scan is normal Previously noted left paraesophageal lymphadenopathy that was previously treated with radiation therapy has normalized since April of this year I recommend we continue surveillance imaging study every 6 months and if she has no signs of disease progression in April 2025, we can consider discontinuation of systemic treatment She is in agreement with the plan of care

## 2022-05-05 NOTE — Assessment & Plan Note (Signed)
She will continue monthly vitamin B12 injection

## 2022-05-08 ENCOUNTER — Other Ambulatory Visit (HOSPITAL_COMMUNITY): Payer: Self-pay

## 2022-05-08 ENCOUNTER — Telehealth: Payer: Self-pay | Admitting: Hematology and Oncology

## 2022-05-08 NOTE — Telephone Encounter (Signed)
Scheduled appointments per WQ. Left voicemail.  

## 2022-05-09 ENCOUNTER — Other Ambulatory Visit: Payer: Self-pay

## 2022-05-09 ENCOUNTER — Other Ambulatory Visit (HOSPITAL_COMMUNITY): Payer: Self-pay

## 2022-05-10 ENCOUNTER — Other Ambulatory Visit (HOSPITAL_COMMUNITY): Payer: Self-pay

## 2022-05-11 ENCOUNTER — Other Ambulatory Visit (HOSPITAL_COMMUNITY): Payer: Self-pay

## 2022-05-11 DIAGNOSIS — M1711 Unilateral primary osteoarthritis, right knee: Secondary | ICD-10-CM | POA: Diagnosis not present

## 2022-05-12 ENCOUNTER — Other Ambulatory Visit: Payer: Self-pay

## 2022-05-17 ENCOUNTER — Other Ambulatory Visit (HOSPITAL_COMMUNITY): Payer: Self-pay

## 2022-05-17 DIAGNOSIS — F419 Anxiety disorder, unspecified: Secondary | ICD-10-CM | POA: Diagnosis not present

## 2022-05-17 MED ORDER — BUSPIRONE HCL 10 MG PO TABS
10.0000 mg | ORAL_TABLET | Freq: Two times a day (BID) | ORAL | 3 refills | Status: DC
  Filled 2022-05-17 – 2022-06-19 (×2): qty 180, 90d supply, fill #0

## 2022-05-17 MED ORDER — VENLAFAXINE HCL ER 150 MG PO CP24
300.0000 mg | ORAL_CAPSULE | ORAL | 3 refills | Status: DC
  Filled 2022-05-17: qty 180, 180d supply, fill #0
  Filled 2022-08-08: qty 180, 90d supply, fill #0
  Filled 2022-11-06: qty 180, 90d supply, fill #1

## 2022-05-18 ENCOUNTER — Encounter: Payer: Self-pay | Admitting: Hematology and Oncology

## 2022-05-18 ENCOUNTER — Other Ambulatory Visit (HOSPITAL_COMMUNITY): Payer: Self-pay

## 2022-05-20 ENCOUNTER — Encounter: Payer: Self-pay | Admitting: Hematology and Oncology

## 2022-05-25 ENCOUNTER — Inpatient Hospital Stay: Payer: 59

## 2022-05-25 VITALS — BP 143/82 | HR 71 | Temp 98.2°F | Resp 16 | Ht 64.0 in | Wt 152.8 lb

## 2022-05-25 DIAGNOSIS — Z95828 Presence of other vascular implants and grafts: Secondary | ICD-10-CM

## 2022-05-25 DIAGNOSIS — K2100 Gastro-esophageal reflux disease with esophagitis, without bleeding: Secondary | ICD-10-CM | POA: Diagnosis not present

## 2022-05-25 DIAGNOSIS — E538 Deficiency of other specified B group vitamins: Secondary | ICD-10-CM | POA: Diagnosis not present

## 2022-05-25 DIAGNOSIS — D61818 Other pancytopenia: Secondary | ICD-10-CM | POA: Diagnosis not present

## 2022-05-25 DIAGNOSIS — C541 Malignant neoplasm of endometrium: Secondary | ICD-10-CM

## 2022-05-25 DIAGNOSIS — Z5112 Encounter for antineoplastic immunotherapy: Secondary | ICD-10-CM | POA: Diagnosis not present

## 2022-05-25 DIAGNOSIS — Z79899 Other long term (current) drug therapy: Secondary | ICD-10-CM | POA: Diagnosis not present

## 2022-05-25 LAB — CBC WITH DIFFERENTIAL (CANCER CENTER ONLY)
Abs Immature Granulocytes: 0.01 10*3/uL (ref 0.00–0.07)
Basophils Absolute: 0.1 10*3/uL (ref 0.0–0.1)
Basophils Relative: 1 %
Eosinophils Absolute: 0.2 10*3/uL (ref 0.0–0.5)
Eosinophils Relative: 5 %
HCT: 34.4 % — ABNORMAL LOW (ref 36.0–46.0)
Hemoglobin: 11.2 g/dL — ABNORMAL LOW (ref 12.0–15.0)
Immature Granulocytes: 0 %
Lymphocytes Relative: 22 %
Lymphs Abs: 1 10*3/uL (ref 0.7–4.0)
MCH: 31.4 pg (ref 26.0–34.0)
MCHC: 32.6 g/dL (ref 30.0–36.0)
MCV: 96.4 fL (ref 80.0–100.0)
Monocytes Absolute: 0.3 10*3/uL (ref 0.1–1.0)
Monocytes Relative: 7 %
Neutro Abs: 3 10*3/uL (ref 1.7–7.7)
Neutrophils Relative %: 65 %
Platelet Count: 203 10*3/uL (ref 150–400)
RBC: 3.57 MIL/uL — ABNORMAL LOW (ref 3.87–5.11)
RDW: 13.6 % (ref 11.5–15.5)
WBC Count: 4.5 10*3/uL (ref 4.0–10.5)
nRBC: 0 % (ref 0.0–0.2)

## 2022-05-25 LAB — CMP (CANCER CENTER ONLY)
ALT: 21 U/L (ref 0–44)
AST: 24 U/L (ref 15–41)
Albumin: 4.1 g/dL (ref 3.5–5.0)
Alkaline Phosphatase: 54 U/L (ref 38–126)
Anion gap: 6 (ref 5–15)
BUN: 23 mg/dL (ref 8–23)
CO2: 29 mmol/L (ref 22–32)
Calcium: 9.7 mg/dL (ref 8.9–10.3)
Chloride: 102 mmol/L (ref 98–111)
Creatinine: 0.86 mg/dL (ref 0.44–1.00)
GFR, Estimated: >60 mL/min (ref >60)
Glucose, Bld: 113 mg/dL — ABNORMAL HIGH (ref 70–99)
Potassium: 4.2 mmol/L (ref 3.5–5.1)
Sodium: 137 mmol/L (ref 135–145)
Total Bilirubin: 0.6 mg/dL (ref 0.3–1.2)
Total Protein: 7.2 g/dL (ref 6.5–8.1)

## 2022-05-25 MED ORDER — SODIUM CHLORIDE 0.9 % IV SOLN
200.0000 mg | Freq: Once | INTRAVENOUS | Status: AC
  Administered 2022-05-25: 200 mg via INTRAVENOUS
  Filled 2022-05-25: qty 200

## 2022-05-25 MED ORDER — HEPARIN SOD (PORK) LOCK FLUSH 100 UNIT/ML IV SOLN
500.0000 [IU] | Freq: Once | INTRAVENOUS | Status: AC | PRN
  Administered 2022-05-25: 500 [IU]

## 2022-05-25 MED ORDER — SODIUM CHLORIDE 0.9% FLUSH
10.0000 mL | Freq: Once | INTRAVENOUS | Status: AC
  Administered 2022-05-25: 10 mL

## 2022-05-25 MED ORDER — SODIUM CHLORIDE 0.9% FLUSH
10.0000 mL | INTRAVENOUS | Status: DC | PRN
  Administered 2022-05-25: 10 mL

## 2022-05-25 MED ORDER — SODIUM CHLORIDE 0.9 % IV SOLN: Freq: Once | INTRAVENOUS | Status: AC

## 2022-05-25 NOTE — Patient Instructions (Signed)
[facility]  Discharge Instructions: Thank you for choosing [facility] to provide your oncology and hematology care.   If you have a lab appointment with the Cancer Center, please go directly to the Cancer Center and check in at the registration area.   Wear comfortable clothing and clothing appropriate for easy access to any Portacath or PICC line.   We strive to give you quality time with your provider. You may need to reschedule your appointment if you arrive late (15 or more minutes).  Arriving late affects you and other patients whose appointments are after yours.  Also, if you miss three or more appointments without notifying the office, you may be dismissed from the clinic at the provider's discretion.      For prescription refill requests, have your pharmacy contact our office and allow 72 hours for refills to be completed.    Today you received the following chemotherapy and/or immunotherapy agents: Keytruda.       To help prevent nausea and vomiting after your treatment, we encourage you to take your nausea medication as directed.  BELOW ARE SYMPTOMS THAT SHOULD BE REPORTED IMMEDIATELY: *FEVER GREATER THAN 100.4 F (38 C) OR HIGHER *CHILLS OR SWEATING *NAUSEA AND VOMITING THAT IS NOT CONTROLLED WITH YOUR NAUSEA MEDICATION *UNUSUAL SHORTNESS OF BREATH *UNUSUAL BRUISING OR BLEEDING *URINARY PROBLEMS (pain or burning when urinating, or frequent urination) *BOWEL PROBLEMS (unusual diarrhea, constipation, pain near the anus) TENDERNESS IN MOUTH AND THROAT WITH OR WITHOUT PRESENCE OF ULCERS (sore throat, sores in mouth, or a toothache) UNUSUAL RASH, SWELLING OR PAIN  UNUSUAL VAGINAL DISCHARGE OR ITCHING   Items with * indicate a potential emergency and should be followed up as soon as possible or go to the Emergency Department if any problems should occur.  Please show the CHEMOTHERAPY ALERT CARD or IMMUNOTHERAPY ALERT CARD at check-in to  the Emergency Department and triage nurse.  Should you have questions after your visit or need to cancel or reschedule your appointment, please contact [facility]  Dept: [phone]  and follow the prompts.  Office hours are 8:00 a.m. to 4:30 p.m. Monday - Friday. Please note that voicemails left after 4:00 p.m. may not be returned until the following business day.  We are closed weekends and major holidays. You have access to a nurse at all times for urgent questions. Please call the main number to the clinic Dept: [phone] and follow the prompts.   For any non-urgent questions, you may also contact your provider using MyChart. We now offer e-Visits for anyone 18 and older to request care online for non-urgent symptoms. For details visit [email or web address].   Also download the MyChart app! Go to the app store, search "MyChart", open the app, select [facility], and log in with your MyChart username and password.  Masks are optional in the cancer centers. If you would like for your care team to wear a mask while they are taking care of you, please let them know. You may have one support person who is at least 68 years old accompany you for your appointments. 

## 2022-06-02 ENCOUNTER — Other Ambulatory Visit: Payer: Self-pay | Admitting: Hematology and Oncology

## 2022-06-02 ENCOUNTER — Other Ambulatory Visit (HOSPITAL_COMMUNITY): Payer: Self-pay

## 2022-06-02 MED ORDER — LEVOTHYROXINE SODIUM 100 MCG PO TABS
100.0000 ug | ORAL_TABLET | Freq: Every day | ORAL | 1 refills | Status: DC
  Filled 2022-06-02: qty 60, 60d supply, fill #0

## 2022-06-03 ENCOUNTER — Other Ambulatory Visit (HOSPITAL_COMMUNITY): Payer: Self-pay

## 2022-06-06 ENCOUNTER — Other Ambulatory Visit: Payer: Self-pay

## 2022-06-06 ENCOUNTER — Other Ambulatory Visit (HOSPITAL_COMMUNITY): Payer: Self-pay

## 2022-06-07 ENCOUNTER — Other Ambulatory Visit: Payer: Self-pay | Admitting: Hematology and Oncology

## 2022-06-07 ENCOUNTER — Encounter: Payer: Self-pay | Admitting: Hematology and Oncology

## 2022-06-07 ENCOUNTER — Other Ambulatory Visit (HOSPITAL_COMMUNITY): Payer: Self-pay

## 2022-06-07 MED ORDER — LENVATINIB (10 MG DAILY DOSE) 10 MG PO CPPK
ORAL_CAPSULE | Freq: Every day | ORAL | 11 refills | Status: DC
  Filled 2022-06-07: qty 30, 30d supply, fill #0
  Filled 2022-07-05: qty 30, 30d supply, fill #1
  Filled 2022-08-04: qty 30, 30d supply, fill #2
  Filled 2022-09-05: qty 30, 30d supply, fill #3
  Filled 2022-10-05: qty 30, 30d supply, fill #4
  Filled 2022-11-09: qty 30, 30d supply, fill #5
  Filled 2022-12-15: qty 30, 30d supply, fill #6
  Filled 2023-01-17: qty 30, 30d supply, fill #7
  Filled 2023-02-14: qty 30, 30d supply, fill #8
  Filled 2023-03-20: qty 30, 30d supply, fill #9
  Filled 2023-04-17: qty 30, 30d supply, fill #10
  Filled 2023-05-18: qty 30, 30d supply, fill #11

## 2022-06-08 ENCOUNTER — Other Ambulatory Visit (HOSPITAL_COMMUNITY): Payer: Self-pay

## 2022-06-08 ENCOUNTER — Encounter: Payer: Self-pay | Admitting: Hematology and Oncology

## 2022-06-09 ENCOUNTER — Encounter: Payer: Self-pay | Admitting: Hematology and Oncology

## 2022-06-09 ENCOUNTER — Encounter (HOSPITAL_COMMUNITY): Payer: Self-pay

## 2022-06-09 ENCOUNTER — Telehealth: Payer: Self-pay | Admitting: Pharmacy Technician

## 2022-06-09 ENCOUNTER — Other Ambulatory Visit (HOSPITAL_COMMUNITY): Payer: Self-pay

## 2022-06-09 ENCOUNTER — Other Ambulatory Visit: Payer: Self-pay

## 2022-06-09 NOTE — Telephone Encounter (Signed)
Oral Oncology Patient Advocate Encounter  Received notificaiton from [facility] that patient's copay card for [place] shows "coverage Terminated".  Called [phone] to discuss copay card eligibility for 2024.  Lady Deutscher, CPhT-Adv Oncology Pharmacy Patient [place] Direct Number: [phone]  Fax: [phone]

## 2022-06-12 ENCOUNTER — Other Ambulatory Visit: Payer: Self-pay

## 2022-06-12 ENCOUNTER — Encounter: Payer: Self-pay | Admitting: Hematology and Oncology

## 2022-06-12 ENCOUNTER — Other Ambulatory Visit (HOSPITAL_COMMUNITY): Payer: Self-pay

## 2022-06-15 ENCOUNTER — Inpatient Hospital Stay: Payer: 59

## 2022-06-15 ENCOUNTER — Other Ambulatory Visit: Payer: Self-pay | Admitting: *Deleted

## 2022-06-15 ENCOUNTER — Other Ambulatory Visit: Payer: Self-pay

## 2022-06-15 ENCOUNTER — Encounter: Payer: Self-pay | Admitting: Hematology and Oncology

## 2022-06-15 ENCOUNTER — Inpatient Hospital Stay: Payer: 59 | Attending: Hematology and Oncology | Admitting: Hematology and Oncology

## 2022-06-15 VITALS — BP 131/72 | HR 69 | Temp 97.9°F | Resp 18 | Ht 64.0 in | Wt 153.8 lb

## 2022-06-15 DIAGNOSIS — Z95828 Presence of other vascular implants and grafts: Secondary | ICD-10-CM

## 2022-06-15 DIAGNOSIS — Z79899 Other long term (current) drug therapy: Secondary | ICD-10-CM | POA: Insufficient documentation

## 2022-06-15 DIAGNOSIS — C541 Malignant neoplasm of endometrium: Secondary | ICD-10-CM

## 2022-06-15 DIAGNOSIS — D6481 Anemia due to antineoplastic chemotherapy: Secondary | ICD-10-CM | POA: Insufficient documentation

## 2022-06-15 DIAGNOSIS — T451X5A Adverse effect of antineoplastic and immunosuppressive drugs, initial encounter: Secondary | ICD-10-CM | POA: Diagnosis not present

## 2022-06-15 DIAGNOSIS — Z5112 Encounter for antineoplastic immunotherapy: Secondary | ICD-10-CM | POA: Insufficient documentation

## 2022-06-15 DIAGNOSIS — E538 Deficiency of other specified B group vitamins: Secondary | ICD-10-CM | POA: Diagnosis not present

## 2022-06-15 DIAGNOSIS — D61818 Other pancytopenia: Secondary | ICD-10-CM | POA: Diagnosis not present

## 2022-06-15 DIAGNOSIS — Z923 Personal history of irradiation: Secondary | ICD-10-CM | POA: Diagnosis not present

## 2022-06-15 DIAGNOSIS — E039 Hypothyroidism, unspecified: Secondary | ICD-10-CM | POA: Diagnosis not present

## 2022-06-15 DIAGNOSIS — I10 Essential (primary) hypertension: Secondary | ICD-10-CM

## 2022-06-15 LAB — CBC WITH DIFFERENTIAL (CANCER CENTER ONLY)
Abs Immature Granulocytes: 0.01 10*3/uL (ref 0.00–0.07)
Basophils Absolute: 0.1 10*3/uL (ref 0.0–0.1)
Basophils Relative: 1 %
Eosinophils Absolute: 0.2 10*3/uL (ref 0.0–0.5)
Eosinophils Relative: 3 %
HCT: 33.3 % — ABNORMAL LOW (ref 36.0–46.0)
Hemoglobin: 11.3 g/dL — ABNORMAL LOW (ref 12.0–15.0)
Immature Granulocytes: 0 %
Lymphocytes Relative: 18 %
Lymphs Abs: 0.9 10*3/uL (ref 0.7–4.0)
MCH: 32.6 pg (ref 26.0–34.0)
MCHC: 33.9 g/dL (ref 30.0–36.0)
MCV: 96 fL (ref 80.0–100.0)
Monocytes Absolute: 0.4 10*3/uL (ref 0.1–1.0)
Monocytes Relative: 8 %
Neutro Abs: 3.5 10*3/uL (ref 1.7–7.7)
Neutrophils Relative %: 70 %
Platelet Count: 205 10*3/uL (ref 150–400)
RBC: 3.47 MIL/uL — ABNORMAL LOW (ref 3.87–5.11)
RDW: 13.6 % (ref 11.5–15.5)
WBC Count: 5 10*3/uL (ref 4.0–10.5)
nRBC: 0 % (ref 0.0–0.2)

## 2022-06-15 LAB — CMP (CANCER CENTER ONLY)
ALT: 16 U/L (ref 0–44)
AST: 20 U/L (ref 15–41)
Albumin: 4.1 g/dL (ref 3.5–5.0)
Alkaline Phosphatase: 43 U/L (ref 38–126)
Anion gap: 7 (ref 5–15)
BUN: 20 mg/dL (ref 8–23)
CO2: 29 mmol/L (ref 22–32)
Calcium: 9.8 mg/dL (ref 8.9–10.3)
Chloride: 99 mmol/L (ref 98–111)
Creatinine: 0.72 mg/dL (ref 0.44–1.00)
GFR, Estimated: >60 mL/min (ref >60)
Glucose, Bld: 79 mg/dL (ref 70–99)
Potassium: 3.9 mmol/L (ref 3.5–5.1)
Sodium: 135 mmol/L (ref 135–145)
Total Bilirubin: 0.7 mg/dL (ref 0.3–1.2)
Total Protein: 7.3 g/dL (ref 6.5–8.1)

## 2022-06-15 LAB — TSH: TSH: 6.788 u[IU]/mL — ABNORMAL HIGH (ref 0.350–4.500)

## 2022-06-15 LAB — VITAMIN B12: Vitamin B-12: 241 pg/mL (ref 180–914)

## 2022-06-15 LAB — TOTAL PROTEIN, URINE DIPSTICK: Protein, ur: <30 mg/dL — AB

## 2022-06-15 MED ORDER — SODIUM CHLORIDE 0.9% FLUSH
10.0000 mL | Freq: Once | INTRAVENOUS | Status: AC
  Administered 2022-06-15: 10 mL

## 2022-06-15 MED ORDER — SODIUM CHLORIDE 0.9 % IV SOLN: Freq: Once | INTRAVENOUS | Status: AC

## 2022-06-15 MED ORDER — SODIUM CHLORIDE 0.9% FLUSH
10.0000 mL | INTRAVENOUS | Status: DC | PRN
  Administered 2022-06-15: 10 mL

## 2022-06-15 MED ORDER — HEPARIN SOD (PORK) LOCK FLUSH 100 UNIT/ML IV SOLN
500.0000 [IU] | Freq: Once | INTRAVENOUS | Status: AC | PRN
  Administered 2022-06-15: 500 [IU]

## 2022-06-15 MED ORDER — SODIUM CHLORIDE 0.9 % IV SOLN
200.0000 mg | Freq: Once | INTRAVENOUS | Status: AC
  Administered 2022-06-15: 200 mg via INTRAVENOUS
  Filled 2022-06-15: qty 200

## 2022-06-15 NOTE — Assessment & Plan Note (Signed)
She had intermittent pancytopenia Recent B12 level in June was low She will receive vitamin B12 injection monthly Plan to recheck again today

## 2022-06-15 NOTE — Progress Notes (Signed)
[place] OFFICE PROGRESS NOTE  Patient Care Team: [name], MD as PCP - General (Family Medicine) Reynold Bowen, MD as Consulting Physician (Endocrinology)  ASSESSMENT & PLAN:  Endometrial cancer [facility]) Her last PET CT scan is normal Previously noted left paraesophageal lymphadenopathy that was previously treated with radiation therapy has normalized since April of this year I recommend we continue surveillance imaging study every 6 months and if she has no signs of disease progression in April 2025, we can consider discontinuation of systemic treatment She is in agreement with the plan of care So far, she tolerated treatment well without any side effects  Anemia due to antineoplastic chemotherapy She had intermittent pancytopenia Recent B12 level in June was low She will receive vitamin B12 injection monthly Plan to recheck again today  Acquired hypothyroidism She has intermittent elevated TSH I will adjust her thyroid medicine accordingly  Orders Placed This Encounter  Procedures   Vitamin B12    Standing Status:   Future    Standing Expiration Date:   06/16/2023    All questions were answered. The patient knows to call the clinic with any problems, questions or concerns. The total time spent in the appointment was 20 minutes encounter with patients including review of chart and various tests results, discussions about plan of care and coordination of care plan   [name], MD 06/15/2022 10:12 AM  INTERVAL HISTORY: Please see below for problem oriented charting. she returns for treatment follow-up on maintenance therapy with pembrolizumab and lenvatinib She denies new side effects except for fatigue but she has not been exercising  REVIEW OF SYSTEMS:   Constitutional: Denies fevers, chills or abnormal weight loss Eyes: Denies blurriness of vision Ears, nose, mouth, throat, and face: Denies mucositis or sore throat Respiratory: Denies cough, dyspnea  or wheezes Cardiovascular: Denies palpitation, chest discomfort or lower extremity swelling Gastrointestinal:  Denies nausea, heartburn or change in bowel habits Skin: Denies abnormal skin rashes Lymphatics: Denies new lymphadenopathy or easy bruising Neurological:Denies numbness, tingling or new weaknesses Behavioral/Psych: Mood is stable, no new changes  All other systems were reviewed with the patient and are negative.  I have reviewed the past medical history, past surgical history, social history and family history with the patient and they are unchanged from previous note.  ALLERGIES:  is allergic to lenvima [lenvatinib].  MEDICATIONS:  Current Outpatient Medications  Medication Sig Dispense Refill   amLODipine (NORVASC) 10 MG tablet TAKE 1 TABLET BY MOUTH ONCE DAILY. **DECREASE SIMVASTATIN TO 20 MG DAILY WHILE ON AMLODIPINE PER MD** 90 tablet 1   busPIRone (BUSPAR) 10 MG tablet Take 1 tablet by mouth 2 times a day 180 tablet 3   busPIRone (BUSPAR) 10 MG tablet Take 1 tablet (10 mg total) by mouth 2 (two) times daily. 180 tablet 3   Cholecalciferol (VITAMIN D3) 2000 units TABS Take 2,000 Units by mouth daily.      cyanocobalamin (DODEX) 1000 MCG/ML injection Inject 1 mL into the muscle every 30 days. 1 mL 11   hydrochlorothiazide (MICROZIDE) 12.5 MG capsule Take 1 capsule (12.5 mg total) by mouth daily. 90 capsule 3   lenvatinib 10 mg daily dose (LENVIMA) capsule TAKE 1 CAPSULE (10 MG TOTAL) BY MOUTH DAILY. 30 each 11   levothyroxine (SYNTHROID) 100 MCG tablet Take 1 tablet (100 mcg total) by mouth daily before breakfast. 60 tablet 1   lidocaine-prilocaine (EMLA) cream APPLY TO AFFECTED AREA ONCE AS DIRECTED 30 g 3   losartan (COZAAR) 25  MG tablet TAKE 1 TABLET BY MOUTH DAILY 90 tablet 3   metoprolol tartrate (LOPRESSOR) 25 MG tablet TAKE 1 TABLET BY MOUTH TWICE DAILY 180 tablet 3   ondansetron (ZOFRAN) 8 MG tablet Take 1 tablet (8 mg total) by mouth every 8 (eight) hours as needed  for nausea. 30 tablet 3   pantoprazole (PROTONIX) 40 MG tablet TAKE 1 TABLET BY MOUTH ONCE DAILY 90 tablet 3   simvastatin (ZOCOR) 20 MG tablet Take 1 tablet by mouth daily in the evening. 90 tablet 3   sodium fluoride (PREVIDENT 5000 PLUS) 1.1 % CREA dental cream Use as directed 51 g 12   SYRINGE-NEEDLE, DISP, 3 ML 25G X 5/8" 3 ML MISC Use as directed to administer B12 shot every 30 days. 12 each 0   tacrolimus (PROTOPIC) 0.1 % ointment Apply to affected area on the skin twice a day 30 g 0   venlafaxine XR (EFFEXOR XR) 150 MG 24 hr capsule Take 2 capsules (300 mg total) by mouth every morning. Take with food. 180 capsule 3   No current facility-administered medications for this visit.   Facility-Administered Medications Ordered in Other Visits  Medication Dose Route Frequency Provider Last Rate Last Admin   heparin lock flush 100 unit/mL  500 Units Intracatheter Once PRN Alvy Bimler, Madiline Saffran, MD       pembrolizumab (KEYTRUDA) 200 mg in sodium chloride 0.9 % 50 mL chemo infusion  200 mg Intravenous Once [name], MD 116 mL/hr at 06/15/22 1009 200 mg at 06/15/22 1009   sodium chloride flush (NS) 0.9 % injection 10 mL  10 mL Intracatheter PRN [name], MD        SUMMARY OF ONCOLOGIC HISTORY: Oncology History Overview Note  [facility] testing done 11-2015 on surgical path from 2014:    MS stable   TMB low  4 muts/mb   ATM S34196   ERBB3 T389K   E2H2 rearrangement exon 9   PPP2R1A P179R   TP53 I195N    ER- APPROXIMATELY 25-35% STAINING IN NEOPLASTIC CELLS (INTERMEDIATE)  PR- APPROXIMATELY 25-35% STAINING IN NEOPLASTIC CELLS (STRONG)  Repeat biopsy 04/02/17: ER negative, Her 2 negative  Progressed on Doxil   Endometrial cancer ([place])  06/20/2012 Pathology Results   Biopsy positive for papillary serous carcinoma   06/20/2012 Genetic Testing   [facility] One testing done 11-2015 on surgical path from 2014:    MS stable   TMB low  4 muts/mb   ATM Q22979   ERBB3 T389K   E2H2  rearrangement exon 9   PPP2R1A P179R   TP53 I195N    06/20/2012 Initial Diagnosis   Patient presented to PCP with intermittent vaginal bleeding since ~ Oct 2013, endometrial biopsy 06-20-12 with complex endometrial hyperplasia with atypia   06/26/2012 Imaging   Thickened endometrial lining in a postmenopausal patient experiencing vaginal bleeding. In the setting of post-menopausal bleeding, endometrial sampling is indicated to exclude carcinoma. No focal myometrial abnormalities are seen.  Normal left ovary and non-visualized right ovary   07/30/2012 Surgery   Dr. Polly Cobia performed robotic hysterectomy with bilateral salpingo-oophorectomy, bilateral pelvic lymph node dissection and periaortic lymph node dissection. Intraoperatively on frozen section, the patient was noted to have a large endometrial polyp with changes within the polyp consistent for high-grade malignancy, possibly papillary serous carcinoma. There is no obvious extrauterine disease noted.     07/30/2012 Pathology Results   [phone] SUPPLEMENTAL REPORT  THE ENDOMETRIAL CARCINOMA WAS ANALYZED FOR DNA MISMATCH REPAIR PROTEINS.  IMMUNOHISTOCHEMICALLY, THE NEOPLASM  RETAINED NUCLEAR EXPRESSION OF 4 GENE PRODUCTS, MLH1, MSH2, MSH6, AND PMS2, INVOLVED IN DNA MISMATCH REPAIR.  POSITIVE AND NEGATIVE CONTROLS WORKED APPROPRIATELY.  PER REQUEST, AN ER AND PR ARE PERFORMED ON BLOCK 1I.  ER- APPROXIMATELY 25-35% STAINING IN NEOPLASTIC CELLS (INTERMEDIATE)  PR- APPROXIMATELY 25-35% STAINING IN NEOPLASTIC CELLS (STRONG)  ER AND PR PREDOMINANTLY SHOW STAINING IN THE SEROUS COMPONENT.  DIAGNOSIS  1. UTERUS, CERVIX WITH BILATERAL FALLOPIAN TUBES AND OVARIES,  HYSTERECTOMY AND BILATERAL SALPINGO-OOPHORECTOMY:  HIGH GRADE/POORLY DIFFERENTIATED ENDOMETRIAL ADENOCARCINOMA WITH SOLID AND SEROUS PAPILLARY COMPONENTS.  HISTOPATHOLOGIC TYPE:    A VARIETY OF PATTERNS WERE PRESENT, ALL OF WHICH SHOULD BE CONSIDERED TO BE HIGH GRADE.  SEROUS  PAPILLARY CARCINOMA WAS SEEN OCCURRING ADJACENT TO SOLID ENDOMETRIAL CARCINOMA WITH MARKED ANAPLASIA AND GIANT CELLS. SIZE: TUMOR MEASURED AT LEAST 4.8 CM IN GREATEST HORIZONTAL DIMENSION.  GRADE: POORLY DIFFERENTIATED OR HIGH GRADE. DEPTH OF INVASION: NO DEFINITE MYOMETRIAL INVOLVEMENT WAS SEEN.  THE TUMOR APPEARED CONFINED TO THE POLYP AS WELL AS SURFACE ENDOMETRIUM. WHERE MYOMETRIAL THICKNESS NOT APPLICABLE. SEROSAL INVOLVEMENT:       NOT DEMONSTRATED.  ENDOCERVICAL INVOLVEMENT:  NOT DEMONSTRATED. RESECTION MARGINS:         FREE OF INVOLVEMENT. EXTRAUTERINE EXTENSION:    NOT DEMONSTRATED. ANGIOLYMPHATIC INVASION:   NOT DEFINITELY SEEN. TOTAL NODES EXAMINED:      22.    PELVIC NODES EXAMINED:  20.    PELVIC NODES INVOLVED:  0.    PARA-AORTIC NODES       2. EXAMINED:    PARA-AORTIC NODES       0. INVOLVED TNM STAGE:                 T1A N0 MX. AJCC STAGE GROUPING:       IA. FIGO STAGE:                IA.   08/26/2012 Imaging   No CT evidence for intra-abdominal or pelvic metastatic disease. Trace free pelvic fluid, presumably postoperative although the date of surgery is not documented in the electronic medical record.   08/26/2012 - 12/13/2012 Chemotherapy   She received 6 cycles of carbo/taxol    10/29/2012 - 11/27/2012 Radiation Therapy   May 27, June 5,  June 11, June 19, November 27, 2012: Proximal vagina 30 Gy in 5 fractions      01/17/2013 Imaging   1.  No evidence of recurrent or metastatic disease. 2.  No acute abnormality involving the abdomen or pelvis. 3.  Mild diffuse hepatic steatosis. 4.  Very small supraumbilical midline anterior abdominal wall hernia containing fat, unchanged   01/22/2014 Imaging   No evidence for metastatic or recurrent disease. 2. No bowel obstruction.  Normal appendix. 3. Small fat containing hernia, stable in appearance. 4. Status post hysterectomy and bilateral oophorectomy   02/19/2014 Imaging   No pulmonary lesions are identified. The abnormality on  the chest x-ray is due to asymmetric left-sided sternoclavicular joint degenerative disease   10/12/2014 Imaging   New mild retroperitoneal lymphadenopathy in the left paraaortic region and proximal left common iliac chain, consistent with metastatic disease. No other sites of metastatic disease identified within the abdomen or pelvis.       11/27/2014 - 02/11/2015 Chemotherapy   She received 4 cycles of carbo/taxol    03/01/2015 PET scan   Single hypermetabolic small retroperitoneal lymph node along the aorta. 2. No evidence of metastatic disease otherwise in the abdomen or pelvis. No evidence local recurrence. 3. Intensely hypermetabolic  enlarged nodule adjacent to the RIGHT lobe of thyroid gland. This presumably represents the biopsied lesion in clinician report which was found to be benign thyroid tissue.   04/05/2015 - 05/13/2015 Radiation Therapy   She received 50.4 gray in 28 fractions with simultaneous integrated boost to 56 gray   06/17/2015 Imaging   No acute process or evidence of metastatic disease in the abdomen or pelvis. Resolution of previously described retroperitoneal adenopathy. 2.  Possible constipation. 3. Atherosclerosis.   06/17/2015 Tumor Marker   Patient's tumor was tested for the following markers: CA125 Results of the tumor marker test revealed 33   08/12/2015 Tumor Marker   Patient's tumor was tested for the following markers: CA125 Results of the tumor marker test revealed 52   08/31/2015 PET scan   Development of right paratracheal hypermetabolic adenopathy, consistent with nodal metastasis. 2. The previously described isolated abdominal retroperitoneal hypermetabolic node has resolved. 3. Persistent hypermetabolic right thyroid nodule, per report previously biopsied. Correlate with those results. 4.  Possible constipation.   09/09/2015 -  Anti-estrogen oral therapy   She has been receiving alternative treatment between megace and tamoxifen   09/09/2015 Tumor Marker    Patient's tumor was tested for the following markers: CA125 Results of the tumor marker test revealed 37.8   09/20/2015 Procedure   Technically successful ultrasound-guided thyroid aspiration biopsy , dominant right nodule   09/20/2015 Pathology Results   THYROID, RIGHT, FINE NEEDLE ASPIRATION (SPECIMEN 1 OF 1 COLLECTED 09/20/15): FINDINGS CONSISTENT WITH BENIGN THYROID NODULE (BETHESDA CATEGORY II).   10/28/2015 Tumor Marker   Patient's tumor was tested for the following markers: CA125 Results of the tumor marker test revealed 53.2   11/04/2015 Imaging   Stable benign right thyroid nodule   12/16/2015 Tumor Marker   Patient's tumor was tested for the following markers: CA125 Results of the tumor marker test revealed 38.1   03/22/2016 PET scan   Interval progression of hypermetabolic right paratracheal lymph node consistent with metastatic involvement. 2. Stable hypermetabolic right thyroid nodule. Reportedly this has been biopsied in the past.   03/23/2016 Tumor Marker   Patient's tumor was tested for the following markers: CA125 Results of the tumor marker test revealed 62.4   04/13/2016 - 06/01/2016 Radiation Therapy   She received 56 Gy to the chest in 28 fractions    05/09/2016 Imaging   No evidence of left lower extremity deep vein thrombosis. No evidence of a superficial thrombosis of the greater and lesser saphenous veins. Positive for thrombus noted in several varicosities of the calf. No evidence of Baker's cyst on the left.   05/17/2016 Tumor Marker   Patient's tumor was tested for the following markers: CA125 Results of the tumor marker test revealed 9   06/29/2016 Tumor Marker   Patient's tumor was tested for the following markers: CA125 Results of the tumor marker test revealed 5.9   08/04/2016 Tumor Marker   Patient's tumor was tested for the following markers: CA125 Results of the tumor marker test revealed 6.0   09/12/2016 PET scan   Complete metabolic response to  therapy, with resolution of hypermetabolic mediastinal lymphadenopathy since prior exam. No residual or new metastatic disease identified. Stable hypermetabolic right thyroid lobe nodule, which was previously biopsied on 09/20/2015.   03/13/2017 PET scan   1. Solitary focus of recurrent right paratracheal hypermetabolic activity, with a 1.0 cm right lower paratracheal node having a maximum SUV of 9.0. Appearance compatible with recurrent malignancy. 2. Continued hypermetabolic right thyroid nodule, previously  biopsied, presumed benign -correlate with prior biopsy results. 3. Other imaging findings of potential clinical significance: Aortic Atherosclerosis (ICD10-I70.0). Mild cardiomegaly. Prominent stool throughout the colon favors constipation.   04/02/2017 Pathology Results   FINE NEEDLE ASPIRATION, ENDOSCOPIC, (EBUS) 4 R NODE (SPECIMEN 1 OF 2 COLLECTED 04/02/17): MALIGNANT CELLS PRESENT, CONSISTENT WITH CARCINOMA. SEE COMMENT. COMMENT: THE MALIGNANT CELLS ARE POSITIVE FOR P53 AND NEGATIVE FOR ESTROGEN RECEPTOR AND TTF-1. THIS PROFILE IS NON-SPECIFIC, BUT THE P53 POSITIVE STAINING IS SUGGESTIVE OF GYNECOLOGIC PRIMARY.    04/11/2017 Procedure   Successful 8 French right internal jugular vein power port placement with its tip at the SVC/RA junction   04/12/2017 Imaging   Normal LV size with mild LV hypertrophy. EF 55-60%. Normal RV size and systolic function. Aortic valve sclerosis without significant stenosis.   04/18/2017 - 06/14/2017 Chemotherapy   The patient had chemotherapy with Doxil    07/10/2017 Imaging   - Left ventricle: The cavity size was normal. There was mild concentric hypertrophy. Systolic function was normal. The estimated ejection fraction was in the range of 55% to 60%. Wall motion was normal; there were no regional wall motion abnormalities. Doppler parameters are consistent with abnormal left ventricular relaxation (grade 1 diastolic dysfunction). - Aortic valve:  Noncoronary cusp mobility was mildly restricted. - Mitral valve: There was mild regurgitation. - Left atrium: The atrium was mildly dilated. - Atrial septum: No defect or patent foramen ovale was identified.  Impressions:  - Compared to November 2018, global LV longitudinal strain remains normal (has increased)   07/11/2017 PET scan   Increased size and hypermetabolic activity of 3.3 cm right thyroid lobe nodule. Thyroid carcinoma cannot be excluded. Recommend repeat ultrasound guided fine needle aspiration to exclude thyroid carcinoma.  New adjacent hypermetabolic 10 mm right supraclavicular lymph node, suspicious for lymph node metastasis.  Slight increase in size and hypermetabolic activity of solitary right paratracheal lymph node.  No evidence of abdominal or pelvic metastatic disease.   07/18/2017 Procedure   1. Technically successful ultrasound guided fine needle aspiration of indeterminate hypermetabolic right-sided thyroid nodule/mass. 2. Technically successful ultrasound-guided core needle biopsy of hypermetabolic right lower cervical lymph node.   07/18/2017 Pathology Results   THYROID, FINE NEEDLE ASPIRATION RIGHT (SPECIMEN 1 OF 1, COLLECTED ON 07/18/2017): ATYPIA OF UNDETERMINED SIGNIFICANCE OR FOLLICULAR LESION OF UNDETERMINED SIGNIFICANCE (BETHESDA CATEGORY III). SEE COMMENT. COMMENT: THE SPECIMEN CONSISTS OF SMALL AND MEDIUM SIZED GROUPS OF FOLLICULAR EPITHELIAL CELLS WITH MILD CYTOLOGIC ATYPIA INCLUDING NUCLEAR ENLARGEMENT AND HURTHLE CELL CHANGE. SOME GROUPS ARE ARRANGED AS MICROFOLLICLES. THERE IS MINIMAL BACKGROUND COLLOID. BASED ON THESE FEATURES, A FOLLICULAR LESION/NEOPLASM CAN NOT BE ENTIRELY RULED OUT. A SPECIMEN WILL BE SENT FOR AFIRMA TESTING.   07/19/2017 Pathology Results   Lymph node, needle/core biopsy - METASTATIC PAPILLARY SEROUS CARCINOMA. - SEE COMMENT. Microscopic Comment Dr. Vicente Males has reviewed the case and concurs with this interpretation    10/15/2017 PET scan   No new or progressive disease. No evidence of abdominal or pelvic metastatic disease.  Stable hypermetabolic right thyroid lobe nodule and adjacent right supraclavicular lymph node.  Decreased size and hypermetabolic activity of solitary right paratracheal lymph node.   01/23/2018 PET scan   1. Overall, no significant change from PET-CT of 3 months ago. There is persistent hypermetabolic activity at the right thoracic inlet and within a right paratracheal mediastinal node. The nodes have not significantly changed in size, although the metabolic activity has minimally increased over this interval. It is uncertain how much of the  thoracic inlet metabolic activity is attributed to the thyroid nodule versus adjacent cervical lymph nodes, both previously biopsied. 2. No new hypermetabolic activity within the neck, chest, abdomen or pelvis. No evidence of local recurrence in the pelvis. 3. Stable probable radiation changes in the right lung.   04/16/2018 PET scan   1. Interval increase in metabolic activity of the RIGHT supraclavicular node and RIGHT lower paratracheal mediastinal node with minimal change in size. Findings consistent with persistent and mildly progressed metastatic adenopathy. 2. New hypermetabolic small lymph node in the RIGHT lower paratracheal nodal station adjacent to the previously followed node. 3. No evidence of local recurrence in the pelvis or new disease elsewhere.   07/30/2018 PET scan   1. Mild interval progression of right supraclavicular, mediastinal, and right hilar hypermetabolic metastatic disease. There is a new hypermetabolic lymph node in the subcarinal station on today's study. 2. No hypermetabolic disease in the abdomen or pelvis to suggest recurrent/metastases. 3.  Aortic Atherosclerois (ICD10-170.0)     08/16/2018 -  Chemotherapy   The patient had Lenvima since 3/6 and pembrolizumab since 3/13 for chemotherapy treatment. Michel Santee was held  temporarily due to infection and severe hypertension   10/31/2018 PET scan   Partial response to therapy, with decreased hypermetabolic lymphadenopathy in mediastinum and right hilar region.   No significant change in hypermetabolic lymphadenopathy in right inferior neck.   No new or progressive metastatic disease identified. No evidence of recurrent or metastatic carcinoma in the pelvis or abdomen   02/04/2019 PET scan   1. Decrease in metabolic activity of RIGHT supraclavicular node and RIGHT paratracheal lymph nodes. 2. Persistent activity in the RIGHT thyroid nodule unchanged. 3. Diffuse activity through the esophagus is favored esophagitis. No change. 4. Scattered foci of intense metabolic activity associated the bowel without focal lesion on CT favored benign. 5. No evidence of peritoneal metastasis. 6. No evidence of metastatic adenopathy in the abdomen pelvis. 7. No evidence of local pelvic sidewall recurrence.   07/30/2019 PET scan   1. Mildly increased uptake within small nodes along the right sternocleidomastoid without change in size and associated with increased deltoid activity may reflect changes related to right arm injection or recent COVID vaccination, consider clinical correlation and attention on follow-up. 2. Persistent activity in the right thyroid nodule, unchanged from previous exams. 3. New activity in a juxta esophageal lymph node in the chest is suspicious though the node is unchanged with respect to size. Endoscopic correlation could potentially be helpful or close attention on follow-up. 4. Subtle increased FDG uptake along the left mainstem bronchus and adjacent to the aorta may represent a small lymph node. No discrete correlate is demonstrated on today's study.   10/22/2019 PET scan   1. Persistent FDG avid subcentimeter right supraclavicular and juxta esophageal lymph nodes. The degree of FDG uptake is similar to the previous exam. No new or progressive findings  identified. 2. No findings of solid organ metastasis, skeletal metastasis or metastatic disease to the abdomen or pelvis. 3. FDG avid right lobe of thyroid gland nodule is again noted This nodule is not incidental and been worked up previously with 2 biopsies. Please refer to results from the most recent biopsy dated 07/18/2017. (Ref: J Am Coll Radiol. 2015 Feb;12(2): 143-50). 4.  Aortic Atherosclerosis (ICD10-I70.0).   05/05/2020 PET scan   1. Progressive disease, as evidenced by increased hypermetabolism within right supraclavicular and mediastinal nodes. 2. No infradiaphragmatic hypermetabolic metastasis. 3.  Aortic Atherosclerosis (ICD10-I70.0). 4. Hypermetabolic right thyroid nodule  has been detailed on prior exams and was biopsied on 07/18/2017   09/10/2020 PET scan   1. Interval improvement in previously demonstrated hypermetabolic mediastinal and right supraclavicular adenopathy. A single hypermetabolic left paraesophageal lymph node remains, improved from previous study. The other nodes have resolved.  2. No progressive disease. 3. Diffuse esophageal hypermetabolic activity suggesting esophagitis. 4. Hypermetabolic right thyroid nodule has been previously biopsied.   03/18/2021 PET scan   1. No significant change in single residual FDG avid left paraesophageal lymph node. This has an SUV max of 7.03, compared with 6.4 previously. No new sites of disease identified. 2. Diffuse esophageal hypermetabolic activity suggestive of esophagitis. 3. FDG avid right lobe of thyroid gland nodule. This has been biopsied previously. (Ref: J Am Coll Radiol. 2015 Feb;12(2): 143-50).     09/22/2021 PET scan   1. Persistent mild diffuse FDG uptake in the esophagus likely chronic inflammation. 2. Resolution of the left paraesophageal lymph node seen on the prior study. 3. Stable hypermetabolic right thyroid nodule, previously biopsied. 4. No findings for recurrent abdominal/pelvic disease or metastatic  disease.     02/23/2022 -  Chemotherapy   Patient is on Treatment Plan : UTERINE Lenvatinib (20) D1-21 + Pembrolizumab (200) D1 q21d     05/04/2022 PET scan   1. Status post hysterectomy without evidence of hypermetabolic local recurrence or hypermetabolic metastatic disease. 2. Persistent diffuse long segment hypermetabolic esophageal activity, likely reflects chronic esophagitis. Consider further evaluation with endoscopy if not previously performed. 3.  Aortic Atherosclerosis (ICD10-I70.0).     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 Lenvima was held temporarily due to infection and severe hypertension     PHYSICAL EXAMINATION: ECOG PERFORMANCE STATUS: 0 - Asymptomatic  Vitals:   06/15/22 0924  BP: 131/72  Pulse: 69  Resp: 18  Temp: 97.9 F (36.6 C)  SpO2: 100%   Filed Weights   06/15/22 0924  Weight: 153 lb 12.8 oz (69.8 kg)    GENERAL:alert, no distress and comfortable NEURO: alert & oriented x 3 with fluent speech, no focal motor/sensory deficits  LABORATORY DATA:  I have reviewed the data as listed    Component Value Date/Time   NA 135 06/15/2022 0857   NA 141 05/17/2017 0957   K 3.9 06/15/2022 0857   K 3.6 05/17/2017 0957   CL 99 06/15/2022 0857   CL 105 11/19/2012 0848   CO2 29 06/15/2022 0857   CO2 25 05/17/2017 0957   GLUCOSE 79 06/15/2022 0857   GLUCOSE 85 05/17/2017 0957   GLUCOSE 122 (H) 11/19/2012 0848   BUN 20 06/15/2022 0857   BUN 15.2 05/17/2017 0957   CREATININE 0.72 06/15/2022 0857   CREATININE 0.8 05/17/2017 0957   CALCIUM 9.8 06/15/2022 0857   CALCIUM 9.6 05/17/2017 0957   PROT 7.3 06/15/2022 0857   PROT 7.0 05/17/2017 0957   ALBUMIN 4.1 06/15/2022 0857   ALBUMIN 3.9 05/17/2017 0957   AST 20 06/15/2022 0857   AST 25 05/17/2017  0957   ALT 16 06/15/2022 0857   ALT 18 05/17/2017 0957   ALKPHOS 43 06/15/2022 0857   ALKPHOS 57 05/17/2017 0957   BILITOT 0.7 06/15/2022 0857   BILITOT 0.47 05/17/2017 0957   GFRNONAA >60 06/15/2022 0857   GFRAA >60 02/26/2020 0940   GFRAA >60 07/10/2019 1240  No results found for: "SPEP", "UPEP"  Lab Results  Component Value Date   WBC 5.0 06/15/2022   NEUTROABS 3.5 06/15/2022   HGB 11.3 (L) 06/15/2022   HCT 33.3 (L) 06/15/2022   MCV 96.0 06/15/2022   PLT 205 06/15/2022      Chemistry      Component Value Date/Time   NA 135 06/15/2022 0857   NA 141 05/17/2017 0957   K 3.9 06/15/2022 0857   K 3.6 05/17/2017 0957   CL 99 06/15/2022 0857   CL 105 11/19/2012 0848   CO2 29 06/15/2022 0857   CO2 25 05/17/2017 0957   BUN 20 06/15/2022 0857   BUN 15.2 05/17/2017 0957   CREATININE 0.72 06/15/2022 0857   CREATININE 0.8 05/17/2017 0957   GLU 158 (H) 01/14/2015 1035      Component Value Date/Time   CALCIUM 9.8 06/15/2022 0857   CALCIUM 9.6 05/17/2017 0957   ALKPHOS 43 06/15/2022 0857   ALKPHOS 57 05/17/2017 0957   AST 20 06/15/2022 0857   AST 25 05/17/2017 0957   ALT 16 06/15/2022 0857   ALT 18 05/17/2017 0957   BILITOT 0.7 06/15/2022 0857   BILITOT 0.47 05/17/2017 0957

## 2022-06-15 NOTE — Assessment & Plan Note (Signed)
She has intermittent elevated TSH I will adjust her thyroid medicine accordingly 

## 2022-06-15 NOTE — Assessment & Plan Note (Signed)
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

## 2022-06-15 NOTE — Patient Instructions (Signed)
[facility]  Discharge Instructions: Thank you for choosing [facility] to provide your oncology and hematology care.   If you have a lab appointment with the Cancer Center, please go directly to the Cancer Center and check in at the registration area.   Wear comfortable clothing and clothing appropriate for easy access to any Portacath or PICC line.   We strive to give you quality time with your provider. You may need to reschedule your appointment if you arrive late (15 or more minutes).  Arriving late affects you and other patients whose appointments are after yours.  Also, if you miss three or more appointments without notifying the office, you may be dismissed from the clinic at the provider's discretion.      For prescription refill requests, have your pharmacy contact our office and allow 72 hours for refills to be completed.    Today you received the following chemotherapy and/or immunotherapy agents: Keytruda      To help prevent nausea and vomiting after your treatment, we encourage you to take your nausea medication as directed.  BELOW ARE SYMPTOMS THAT SHOULD BE REPORTED IMMEDIATELY: *FEVER GREATER THAN 100.4 F (38 C) OR HIGHER *CHILLS OR SWEATING *NAUSEA AND VOMITING THAT IS NOT CONTROLLED WITH YOUR NAUSEA MEDICATION *UNUSUAL SHORTNESS OF BREATH *UNUSUAL BRUISING OR BLEEDING *URINARY PROBLEMS (pain or burning when urinating, or frequent urination) *BOWEL PROBLEMS (unusual diarrhea, constipation, pain near the anus) TENDERNESS IN MOUTH AND THROAT WITH OR WITHOUT PRESENCE OF ULCERS (sore throat, sores in mouth, or a toothache) UNUSUAL RASH, SWELLING OR PAIN  UNUSUAL VAGINAL DISCHARGE OR ITCHING   Items with * indicate a potential emergency and should be followed up as soon as possible or go to the Emergency Department if any problems should occur.  Please show the CHEMOTHERAPY ALERT CARD or IMMUNOTHERAPY ALERT CARD at check-in to  the Emergency Department and triage nurse.  Should you have questions after your visit or need to cancel or reschedule your appointment, please contact [facility]  Dept: [phone]  and follow the prompts.  Office hours are 8:00 a.m. to 4:30 p.m. Monday - Friday. Please note that voicemails left after 4:00 p.m. may not be returned until the following business day.  We are closed weekends and major holidays. You have access to a nurse at all times for urgent questions. Please call the main number to the clinic Dept: [phone] and follow the prompts.   For any non-urgent questions, you may also contact your provider using MyChart. We now offer e-Visits for anyone 18 and older to request care online for non-urgent symptoms. For details visit [email or web address].   Also download the MyChart app! Go to the app store, search "MyChart", open the app, select [facility], and log in with your MyChart username and password.   

## 2022-06-16 ENCOUNTER — Other Ambulatory Visit (HOSPITAL_COMMUNITY): Payer: Self-pay

## 2022-06-16 LAB — T4: T4, Total: 9 ug/dL (ref 4.5–12.0)

## 2022-06-19 ENCOUNTER — Other Ambulatory Visit (HOSPITAL_COMMUNITY): Payer: Self-pay

## 2022-06-19 ENCOUNTER — Other Ambulatory Visit: Payer: Self-pay

## 2022-06-20 ENCOUNTER — Telehealth: Payer: Self-pay | Admitting: Hematology and Oncology

## 2022-06-20 NOTE — Telephone Encounter (Signed)
Scheduled appointment per WQ. Left voicemail.

## 2022-06-21 ENCOUNTER — Other Ambulatory Visit: Payer: Self-pay

## 2022-06-22 ENCOUNTER — Other Ambulatory Visit (HOSPITAL_COMMUNITY): Payer: Self-pay

## 2022-07-05 ENCOUNTER — Other Ambulatory Visit (HOSPITAL_COMMUNITY): Payer: Self-pay

## 2022-07-06 ENCOUNTER — Inpatient Hospital Stay: Payer: 59 | Attending: Hematology and Oncology

## 2022-07-06 ENCOUNTER — Inpatient Hospital Stay: Payer: 59

## 2022-07-06 VITALS — BP 134/71 | HR 69 | Temp 98.2°F | Resp 18 | Wt 155.0 lb

## 2022-07-06 DIAGNOSIS — C541 Malignant neoplasm of endometrium: Secondary | ICD-10-CM | POA: Insufficient documentation

## 2022-07-06 DIAGNOSIS — Z79899 Other long term (current) drug therapy: Secondary | ICD-10-CM | POA: Diagnosis not present

## 2022-07-06 DIAGNOSIS — Z95828 Presence of other vascular implants and grafts: Secondary | ICD-10-CM

## 2022-07-06 DIAGNOSIS — Z5112 Encounter for antineoplastic immunotherapy: Secondary | ICD-10-CM | POA: Insufficient documentation

## 2022-07-06 DIAGNOSIS — E039 Hypothyroidism, unspecified: Secondary | ICD-10-CM

## 2022-07-06 LAB — CBC WITH DIFFERENTIAL (CANCER CENTER ONLY)
Abs Immature Granulocytes: 0 10*3/uL (ref 0.00–0.07)
Basophils Absolute: 0.1 10*3/uL (ref 0.0–0.1)
Basophils Relative: 2 %
Eosinophils Absolute: 0.2 10*3/uL (ref 0.0–0.5)
Eosinophils Relative: 6 %
HCT: 33.4 % — ABNORMAL LOW (ref 36.0–46.0)
Hemoglobin: 11.3 g/dL — ABNORMAL LOW (ref 12.0–15.0)
Immature Granulocytes: 0 %
Lymphocytes Relative: 23 %
Lymphs Abs: 1 10*3/uL (ref 0.7–4.0)
MCH: 32.5 pg (ref 26.0–34.0)
MCHC: 33.8 g/dL (ref 30.0–36.0)
MCV: 96 fL (ref 80.0–100.0)
Monocytes Absolute: 0.3 10*3/uL (ref 0.1–1.0)
Monocytes Relative: 8 %
Neutro Abs: 2.5 10*3/uL (ref 1.7–7.7)
Neutrophils Relative %: 61 %
Platelet Count: 225 10*3/uL (ref 150–400)
RBC: 3.48 MIL/uL — ABNORMAL LOW (ref 3.87–5.11)
RDW: 13.5 % (ref 11.5–15.5)
WBC Count: 4.1 10*3/uL (ref 4.0–10.5)
nRBC: 0 % (ref 0.0–0.2)

## 2022-07-06 LAB — CMP (CANCER CENTER ONLY)
ALT: 18 U/L (ref 0–44)
AST: 21 U/L (ref 15–41)
Albumin: 4 g/dL (ref 3.5–5.0)
Alkaline Phosphatase: 48 U/L (ref 38–126)
Anion gap: 8 (ref 5–15)
BUN: 22 mg/dL (ref 8–23)
CO2: 28 mmol/L (ref 22–32)
Calcium: 10 mg/dL (ref 8.9–10.3)
Chloride: 102 mmol/L (ref 98–111)
Creatinine: 0.81 mg/dL (ref 0.44–1.00)
GFR, Estimated: >60 mL/min (ref >60)
Glucose, Bld: 77 mg/dL (ref 70–99)
Potassium: 4 mmol/L (ref 3.5–5.1)
Sodium: 138 mmol/L (ref 135–145)
Total Bilirubin: 0.6 mg/dL (ref 0.3–1.2)
Total Protein: 7.4 g/dL (ref 6.5–8.1)

## 2022-07-06 LAB — TSH: TSH: 4.678 u[IU]/mL — ABNORMAL HIGH (ref 0.350–4.500)

## 2022-07-06 MED ORDER — SODIUM CHLORIDE 0.9 % IV SOLN: Freq: Once | INTRAVENOUS | Status: AC

## 2022-07-06 MED ORDER — SODIUM CHLORIDE 0.9% FLUSH
10.0000 mL | INTRAVENOUS | Status: DC | PRN
  Administered 2022-07-06: 10 mL

## 2022-07-06 MED ORDER — SODIUM CHLORIDE 0.9% FLUSH
10.0000 mL | Freq: Once | INTRAVENOUS | Status: AC
  Administered 2022-07-06: 10 mL

## 2022-07-06 MED ORDER — HEPARIN SOD (PORK) LOCK FLUSH 100 UNIT/ML IV SOLN
500.0000 [IU] | Freq: Once | INTRAVENOUS | Status: AC | PRN
  Administered 2022-07-06: 500 [IU]

## 2022-07-06 MED ORDER — SODIUM CHLORIDE 0.9 % IV SOLN
200.0000 mg | Freq: Once | INTRAVENOUS | Status: AC
  Administered 2022-07-06: 200 mg via INTRAVENOUS
  Filled 2022-07-06: qty 8

## 2022-07-06 NOTE — Patient Instructions (Signed)
[facility]  Discharge Instructions: Thank you for choosing [facility] to provide your oncology and hematology care.   If you have a lab appointment with the [facility], please go directly to the [facility] and check in at the registration area.   Wear comfortable clothing and clothing appropriate for easy access to any Portacath or PICC line.   We strive to give you quality time with your provider. You may need to reschedule your appointment if you arrive late (15 or more minutes).  Arriving late affects you and other patients whose appointments are after yours.  Also, if you miss three or more appointments without notifying the office, you may be dismissed from the clinic at the provider's discretion.      For prescription refill requests, have your pharmacy contact our office and allow 72 hours for refills to be completed.    Today you received the following chemotherapy and/or immunotherapy agents: Keytruda.       To help prevent nausea and vomiting after your treatment, we encourage you to take your nausea medication as directed.  BELOW ARE SYMPTOMS THAT SHOULD BE REPORTED IMMEDIATELY: *FEVER GREATER THAN 100.4 F (38 C) OR HIGHER *CHILLS OR SWEATING *NAUSEA AND VOMITING THAT IS NOT CONTROLLED WITH YOUR NAUSEA MEDICATION *UNUSUAL SHORTNESS OF BREATH *UNUSUAL BRUISING OR BLEEDING *URINARY PROBLEMS (pain or burning when urinating, or frequent urination) *BOWEL PROBLEMS (unusual diarrhea, constipation, pain near the anus) TENDERNESS IN MOUTH AND THROAT WITH OR WITHOUT PRESENCE OF ULCERS (sore throat, sores in mouth, or a toothache) UNUSUAL RASH, SWELLING OR PAIN  UNUSUAL VAGINAL DISCHARGE OR ITCHING   Items with * indicate a potential emergency and should be followed up as soon as possible or go to the Emergency Department if any problems should occur.  Please show the CHEMOTHERAPY ALERT CARD or IMMUNOTHERAPY ALERT CARD at  check-in to the Emergency Department and triage nurse.  Should you have questions after your visit or need to cancel or reschedule your appointment, please contact [facility]  Dept: [phone]  and follow the prompts.  Office hours are 8:00 a.m. to 4:30 p.m. Monday - Friday. Please note that voicemails left after 4:00 p.m. may not be returned until the following business day.  We are closed weekends and major holidays. You have access to a nurse at all times for urgent questions. Please call the main number to the clinic Dept: [phone] and follow the prompts.   For any non-urgent questions, you may also contact your provider using MyChart. We now offer e-Visits for anyone 67 and older to request care online for non-urgent symptoms. For details visit [email or web address].   Also download the MyChart app! Go to the app store, search "MyChart", open the app, select [facility], and log in with your MyChart username and password.

## 2022-07-13 ENCOUNTER — Other Ambulatory Visit (HOSPITAL_COMMUNITY): Payer: Self-pay

## 2022-07-13 ENCOUNTER — Other Ambulatory Visit: Payer: Self-pay

## 2022-07-18 ENCOUNTER — Other Ambulatory Visit (HOSPITAL_COMMUNITY): Payer: Self-pay

## 2022-07-24 ENCOUNTER — Other Ambulatory Visit (HOSPITAL_COMMUNITY): Payer: Self-pay

## 2022-07-27 ENCOUNTER — Inpatient Hospital Stay: Payer: 59

## 2022-07-27 ENCOUNTER — Inpatient Hospital Stay (HOSPITAL_BASED_OUTPATIENT_CLINIC_OR_DEPARTMENT_OTHER): Payer: 59 | Admitting: Hematology and Oncology

## 2022-07-27 ENCOUNTER — Encounter: Payer: Self-pay | Admitting: Hematology and Oncology

## 2022-07-27 VITALS — BP 124/68 | HR 70 | Resp 18 | Ht 64.0 in | Wt 155.4 lb

## 2022-07-27 VITALS — BP 116/63 | HR 65 | Temp 98.2°F | Resp 18

## 2022-07-27 DIAGNOSIS — C541 Malignant neoplasm of endometrium: Secondary | ICD-10-CM

## 2022-07-27 DIAGNOSIS — I10 Essential (primary) hypertension: Secondary | ICD-10-CM

## 2022-07-27 DIAGNOSIS — Z95828 Presence of other vascular implants and grafts: Secondary | ICD-10-CM

## 2022-07-27 DIAGNOSIS — E039 Hypothyroidism, unspecified: Secondary | ICD-10-CM

## 2022-07-27 DIAGNOSIS — Z79899 Other long term (current) drug therapy: Secondary | ICD-10-CM | POA: Diagnosis not present

## 2022-07-27 DIAGNOSIS — Z5112 Encounter for antineoplastic immunotherapy: Secondary | ICD-10-CM | POA: Diagnosis not present

## 2022-07-27 LAB — CBC WITH DIFFERENTIAL (CANCER CENTER ONLY)
Abs Immature Granulocytes: 0.01 10*3/uL (ref 0.00–0.07)
Basophils Absolute: 0.1 10*3/uL (ref 0.0–0.1)
Basophils Relative: 2 %
Eosinophils Absolute: 0.4 10*3/uL (ref 0.0–0.5)
Eosinophils Relative: 9 %
HCT: 33 % — ABNORMAL LOW (ref 36.0–46.0)
Hemoglobin: 11.2 g/dL — ABNORMAL LOW (ref 12.0–15.0)
Immature Granulocytes: 0 %
Lymphocytes Relative: 21 %
Lymphs Abs: 0.9 10*3/uL (ref 0.7–4.0)
MCH: 32.7 pg (ref 26.0–34.0)
MCHC: 33.9 g/dL (ref 30.0–36.0)
MCV: 96.2 fL (ref 80.0–100.0)
Monocytes Absolute: 0.3 10*3/uL (ref 0.1–1.0)
Monocytes Relative: 8 %
Neutro Abs: 2.5 10*3/uL (ref 1.7–7.7)
Neutrophils Relative %: 60 %
Platelet Count: 198 10*3/uL (ref 150–400)
RBC: 3.43 MIL/uL — ABNORMAL LOW (ref 3.87–5.11)
RDW: 13.2 % (ref 11.5–15.5)
WBC Count: 4.1 10*3/uL (ref 4.0–10.5)
nRBC: 0 % (ref 0.0–0.2)

## 2022-07-27 LAB — CMP (CANCER CENTER ONLY)
ALT: 18 U/L (ref 0–44)
AST: 21 U/L (ref 15–41)
Albumin: 4 g/dL (ref 3.5–5.0)
Alkaline Phosphatase: 49 U/L (ref 38–126)
Anion gap: 7 (ref 5–15)
BUN: 19 mg/dL (ref 8–23)
CO2: 29 mmol/L (ref 22–32)
Calcium: 9.5 mg/dL (ref 8.9–10.3)
Chloride: 100 mmol/L (ref 98–111)
Creatinine: 0.81 mg/dL (ref 0.44–1.00)
GFR, Estimated: >60 mL/min (ref >60)
Glucose, Bld: 86 mg/dL (ref 70–99)
Potassium: 4.1 mmol/L (ref 3.5–5.1)
Sodium: 136 mmol/L (ref 135–145)
Total Bilirubin: 0.5 mg/dL (ref 0.3–1.2)
Total Protein: 7.2 g/dL (ref 6.5–8.1)

## 2022-07-27 LAB — TOTAL PROTEIN, URINE DIPSTICK: Protein, ur: NEGATIVE mg/dL

## 2022-07-27 MED ORDER — HEPARIN SOD (PORK) LOCK FLUSH 100 UNIT/ML IV SOLN
500.0000 [IU] | Freq: Once | INTRAVENOUS | Status: AC | PRN
  Administered 2022-07-27: 500 [IU]

## 2022-07-27 MED ORDER — SODIUM CHLORIDE 0.9% FLUSH
10.0000 mL | Freq: Once | INTRAVENOUS | Status: AC
  Administered 2022-07-27: 10 mL

## 2022-07-27 MED ORDER — SODIUM CHLORIDE 0.9 % IV SOLN
200.0000 mg | Freq: Once | INTRAVENOUS | Status: AC
  Administered 2022-07-27: 200 mg via INTRAVENOUS
  Filled 2022-07-27: qty 8

## 2022-07-27 MED ORDER — SODIUM CHLORIDE 0.9 % IV SOLN: Freq: Once | INTRAVENOUS | Status: AC

## 2022-07-27 MED ORDER — SODIUM CHLORIDE 0.9% FLUSH
10.0000 mL | INTRAVENOUS | Status: DC | PRN
  Administered 2022-07-27: 10 mL

## 2022-07-27 NOTE — Assessment & Plan Note (Signed)
Her blood pressure is stable She will continue Lenvima and her current blood pressure treatment

## 2022-07-27 NOTE — Assessment & Plan Note (Signed)
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

## 2022-07-27 NOTE — Patient Instructions (Signed)
[facility]  Discharge Instructions: Thank you for choosing [facility] to provide your oncology and hematology care.   If you have a lab appointment with the Cancer Center, please go directly to the Cancer Center and check in at the registration area.   Wear comfortable clothing and clothing appropriate for easy access to any Portacath or PICC line.   We strive to give you quality time with your provider. You may need to reschedule your appointment if you arrive late (15 or more minutes).  Arriving late affects you and other patients whose appointments are after yours.  Also, if you miss three or more appointments without notifying the office, you may be dismissed from the clinic at the provider's discretion.      For prescription refill requests, have your pharmacy contact our office and allow 72 hours for refills to be completed.    Today you received the following chemotherapy and/or immunotherapy agents Keytruda      To help prevent nausea and vomiting after your treatment, we encourage you to take your nausea medication as directed.  BELOW ARE SYMPTOMS THAT SHOULD BE REPORTED IMMEDIATELY: *FEVER GREATER THAN 100.4 F (38 C) OR HIGHER *CHILLS OR SWEATING *NAUSEA AND VOMITING THAT IS NOT CONTROLLED WITH YOUR NAUSEA MEDICATION *UNUSUAL SHORTNESS OF BREATH *UNUSUAL BRUISING OR BLEEDING *URINARY PROBLEMS (pain or burning when urinating, or frequent urination) *BOWEL PROBLEMS (unusual diarrhea, constipation, pain near the anus) TENDERNESS IN MOUTH AND THROAT WITH OR WITHOUT PRESENCE OF ULCERS (sore throat, sores in mouth, or a toothache) UNUSUAL RASH, SWELLING OR PAIN  UNUSUAL VAGINAL DISCHARGE OR ITCHING   Items with * indicate a potential emergency and should be followed up as soon as possible or go to the Emergency Department if any problems should occur.  Please show the CHEMOTHERAPY ALERT CARD or IMMUNOTHERAPY ALERT CARD at  check-in to the Emergency Department and triage nurse.  Should you have questions after your visit or need to cancel or reschedule your appointment, please contact [facility]  Dept: [phone]  and follow the prompts.  Office hours are 8:00 a.m. to 4:30 p.m. Monday - Friday. Please note that voicemails left after 4:00 p.m. may not be returned until the following business day.  We are closed weekends and major holidays. You have access to a nurse at all times for urgent questions. Please call the main number to the clinic Dept: [phone] and follow the prompts.   For any non-urgent questions, you may also contact your provider using MyChart. We now offer e-Visits for anyone 18 and older to request care online for non-urgent symptoms. For details visit [email or web address].   Also download the MyChart app! Go to the app store, search "MyChart", open the app, select [facility], and log in with your MyChart username and password.   

## 2022-07-27 NOTE — Assessment & Plan Note (Signed)
She has intermittent elevated TSH I will adjust her thyroid medicine accordingly

## 2022-07-27 NOTE — Progress Notes (Signed)
[place] OFFICE PROGRESS NOTE  Patient Care Team: [name], MD as PCP - General (Family Medicine) Reynold Bowen, MD as Consulting Physician (Endocrinology)  ASSESSMENT & PLAN:  Endometrial cancer [facility]) Her last PET CT scan is normal Previously noted left paraesophageal lymphadenopathy that was previously treated with radiation therapy has normalized since April of 2023 I recommend we continue surveillance imaging study every 6 months and if she has no signs of disease progression in April 2025, we can consider discontinuation of systemic treatment She is in agreement with the plan of care So far, she tolerated treatment well without any side effects I recommend changing her treatment to every 6 weeks in the future and she is in agreement  Acquired hypothyroidism She has intermittent elevated TSH I will adjust her thyroid medicine accordingly  Essential hypertension Her blood pressure is stable She will continue Lenvima and her current blood pressure treatment  Orders Placed This Encounter  Procedures   CBC with Differential ([place] Only)    Standing Status:   Future    Standing Expiration Date:   08/18/2023   CMP ([place] only)    Standing Status:   Future    Standing Expiration Date:   08/18/2023   CBC with Differential ([facility] Only)    Standing Status:   Future    Standing Expiration Date:   09/29/2023   CMP ([place] only)    Standing Status:   Future    Standing Expiration Date:   09/29/2023    All questions were answered. The patient knows to call the clinic with any problems, questions or concerns. The total time spent in the appointment was 20 minutes encounter with patients including review of chart and various tests results, discussions about plan of care and coordination of care plan   [name], MD 07/27/2022 10:29 AM  INTERVAL HISTORY: Please see below for problem oriented charting. she returns for treatment  follow-up on Lenvima and pembrolizumab She tolerated treatment well Her blood pressure is within normal range She have no new side effects of therapy  REVIEW OF SYSTEMS:   Constitutional: Denies fevers, chills or abnormal weight loss Eyes: Denies blurriness of vision Ears, nose, mouth, throat, and face: Denies mucositis or sore throat Respiratory: Denies cough, dyspnea or wheezes Cardiovascular: Denies palpitation, chest discomfort or lower extremity swelling Gastrointestinal:  Denies nausea, heartburn or change in bowel habits Skin: Denies abnormal skin rashes Lymphatics: Denies new lymphadenopathy or easy bruising Neurological:Denies numbness, tingling or new weaknesses Behavioral/Psych: Mood is stable, no new changes  All other systems were reviewed with the patient and are negative.  I have reviewed the past medical history, past surgical history, social history and family history with the patient and they are unchanged from previous note.  ALLERGIES:  is allergic to lenvima [lenvatinib].  MEDICATIONS:  Current Outpatient Medications  Medication Sig Dispense Refill   amLODipine (NORVASC) 10 MG tablet TAKE 1 TABLET BY MOUTH ONCE DAILY. **DECREASE SIMVASTATIN TO 20 MG DAILY WHILE ON AMLODIPINE PER MD** 90 tablet 1   busPIRone (BUSPAR) 10 MG tablet Take 1 tablet by mouth 2 times a day 180 tablet 3   busPIRone (BUSPAR) 10 MG tablet Take 1 tablet (10 mg total) by mouth 2 (two) times daily. 180 tablet 3   Cholecalciferol (VITAMIN D3) 2000 units TABS Take 2,000 Units by mouth daily.      cyanocobalamin (DODEX) 1000 MCG/ML injection Inject 1 mL into the muscle every 30 days. 1 mL  11   hydrochlorothiazide (MICROZIDE) 12.5 MG capsule Take 1 capsule (12.5 mg total) by mouth daily. 90 capsule 3   lenvatinib 10 mg daily dose (LENVIMA) capsule TAKE 1 CAPSULE (10 MG TOTAL) BY MOUTH DAILY. 30 each 11   levothyroxine (SYNTHROID) 100 MCG tablet Take 1 tablet (100 mcg total) by mouth daily before  breakfast. 60 tablet 1   losartan (COZAAR) 25 MG tablet TAKE 1 TABLET BY MOUTH DAILY 90 tablet 3   metoprolol tartrate (LOPRESSOR) 25 MG tablet TAKE 1 TABLET BY MOUTH TWICE DAILY 180 tablet 3   ondansetron (ZOFRAN) 8 MG tablet Take 1 tablet (8 mg total) by mouth every 8 (eight) hours as needed for nausea. 30 tablet 3   pantoprazole (PROTONIX) 40 MG tablet TAKE 1 TABLET BY MOUTH ONCE DAILY 90 tablet 3   simvastatin (ZOCOR) 20 MG tablet Take 1 tablet by mouth daily in the evening. 90 tablet 3   sodium fluoride (PREVIDENT 5000 PLUS) 1.1 % CREA dental cream Use as directed 51 g 12   SYRINGE-NEEDLE, DISP, 3 ML 25G X 5/8" 3 ML MISC Use as directed to administer B12 shot every 30 days. 12 each 0   tacrolimus (PROTOPIC) 0.1 % ointment Apply to affected area on the skin twice a day 30 g 0   venlafaxine XR (EFFEXOR XR) 150 MG 24 hr capsule Take 2 capsules (300 mg total) by mouth every morning. Take with food. 180 capsule 3   No current facility-administered medications for this visit.   Facility-Administered Medications Ordered in Other Visits  Medication Dose Route Frequency Provider Last Rate Last Admin   heparin lock flush 100 unit/mL  500 Units Intracatheter Once PRN Alvy Bimler, Coutney Wildermuth, MD       pembrolizumab (KEYTRUDA) 200 mg in sodium chloride 0.9 % 50 mL chemo infusion  200 mg Intravenous Once [name], MD 116 mL/hr at 07/27/22 1005 200 mg at 07/27/22 1005   sodium chloride flush (NS) 0.9 % injection 10 mL  10 mL Intracatheter PRN [name], MD        SUMMARY OF ONCOLOGIC HISTORY: Oncology History Overview Note  [facility] testing done 11-2015 on surgical path from 2014:    MS stable   TMB low  4 muts/mb   ATM [id number]   ERBB3 T389K   E2H2 rearrangement exon 9   PPP2R1A P179R   TP53 I195N    ER- APPROXIMATELY 25-35% STAINING IN NEOPLASTIC CELLS (INTERMEDIATE)  PR- APPROXIMATELY 25-35% STAINING IN NEOPLASTIC CELLS (STRONG)  Repeat biopsy 04/02/17: ER negative, Her 2  negative  Progressed on Doxil   Endometrial cancer ([place])  06/20/2012 Pathology Results   Biopsy positive for papillary serous carcinoma   06/20/2012 Genetic Testing   [facility] One testing done 11-2015 on surgical path from 2014:    MS stable   TMB low  4 muts/mb   ATM [id number]   ERBB3 T389K   E2H2 rearrangement exon 9   PPP2R1A P179R   TP53 I195N    06/20/2012 Initial Diagnosis   Patient presented to PCP with intermittent vaginal bleeding since ~ Oct 2013, endometrial biopsy 06-20-12 with complex endometrial hyperplasia with atypia   06/26/2012 Imaging   Thickened endometrial lining in a postmenopausal patient experiencing vaginal bleeding. In the setting of post-menopausal bleeding, endometrial sampling is indicated to exclude carcinoma. No focal myometrial abnormalities are seen.  Normal left ovary and non-visualized right ovary   07/30/2012 Surgery   Dr. Polly Cobia performed robotic hysterectomy with bilateral salpingo-oophorectomy, bilateral pelvic lymph node  dissection and periaortic lymph node dissection. Intraoperatively on frozen section, the patient was noted to have a large endometrial polyp with changes within the polyp consistent for high-grade malignancy, possibly papillary serous carcinoma. There is no obvious extrauterine disease noted.     07/30/2012 Pathology Results   [phone] SUPPLEMENTAL REPORT  THE ENDOMETRIAL CARCINOMA WAS ANALYZED FOR DNA MISMATCH REPAIR PROTEINS.  IMMUNOHISTOCHEMICALLY, THE NEOPLASM RETAINED NUCLEAR EXPRESSION OF 4 GENE PRODUCTS, MLH1, MSH2, MSH6, AND PMS2, INVOLVED IN DNA MISMATCH REPAIR.  POSITIVE AND NEGATIVE CONTROLS WORKED APPROPRIATELY.  PER REQUEST, AN ER AND PR ARE PERFORMED ON BLOCK 1I.  ER- APPROXIMATELY 25-35% STAINING IN NEOPLASTIC CELLS (INTERMEDIATE)  PR- APPROXIMATELY 25-35% STAINING IN NEOPLASTIC CELLS (STRONG)  ER AND PR PREDOMINANTLY SHOW STAINING IN THE SEROUS COMPONENT.  DIAGNOSIS  1. UTERUS, CERVIX WITH BILATERAL  FALLOPIAN TUBES AND OVARIES,  HYSTERECTOMY AND BILATERAL SALPINGO-OOPHORECTOMY:  HIGH GRADE/POORLY DIFFERENTIATED ENDOMETRIAL ADENOCARCINOMA WITH SOLID AND SEROUS PAPILLARY COMPONENTS.  HISTOPATHOLOGIC TYPE:    A VARIETY OF PATTERNS WERE PRESENT, ALL OF WHICH SHOULD BE CONSIDERED TO BE HIGH GRADE.  SEROUS PAPILLARY CARCINOMA WAS SEEN OCCURRING ADJACENT TO SOLID ENDOMETRIAL CARCINOMA WITH MARKED ANAPLASIA AND GIANT CELLS. SIZE: TUMOR MEASURED AT LEAST 4.8 CM IN GREATEST HORIZONTAL DIMENSION.  GRADE: POORLY DIFFERENTIATED OR HIGH GRADE. DEPTH OF INVASION: NO DEFINITE MYOMETRIAL INVOLVEMENT WAS SEEN.  THE TUMOR APPEARED CONFINED TO THE POLYP AS WELL AS SURFACE ENDOMETRIUM. WHERE MYOMETRIAL THICKNESS NOT APPLICABLE. SEROSAL INVOLVEMENT:       NOT DEMONSTRATED.  ENDOCERVICAL INVOLVEMENT:  NOT DEMONSTRATED. RESECTION MARGINS:         FREE OF INVOLVEMENT. EXTRAUTERINE EXTENSION:    NOT DEMONSTRATED. ANGIOLYMPHATIC INVASION:   NOT DEFINITELY SEEN. TOTAL NODES EXAMINED:      22.    PELVIC NODES EXAMINED:  20.    PELVIC NODES INVOLVED:  0.    PARA-AORTIC NODES       2. EXAMINED:    PARA-AORTIC NODES       0. INVOLVED TNM STAGE:                 T1A N0 MX. AJCC STAGE GROUPING:       IA. FIGO STAGE:                IA.   08/26/2012 Imaging   No CT evidence for intra-abdominal or pelvic metastatic disease. Trace free pelvic fluid, presumably postoperative although the date of surgery is not documented in the electronic medical record.   08/26/2012 - 12/13/2012 Chemotherapy   She received 6 cycles of carbo/taxol    10/29/2012 - 11/27/2012 Radiation Therapy   May 27, June 5,  June 11, June 19, November 27, 2012: Proximal vagina 30 Gy in 5 fractions      01/17/2013 Imaging   1.  No evidence of recurrent or metastatic disease. 2.  No acute abnormality involving the abdomen or pelvis. 3.  Mild diffuse hepatic steatosis. 4.  Very small supraumbilical midline anterior abdominal wall hernia containing fat,  unchanged   01/22/2014 Imaging   No evidence for metastatic or recurrent disease. 2. No bowel obstruction.  Normal appendix. 3. Small fat containing hernia, stable in appearance. 4. Status post hysterectomy and bilateral oophorectomy   02/19/2014 Imaging   No pulmonary lesions are identified. The abnormality on the chest x-ray is due to asymmetric left-sided sternoclavicular joint degenerative disease   10/12/2014 Imaging   New mild retroperitoneal lymphadenopathy in the left paraaortic region and proximal left common iliac chain, consistent with metastatic  disease. No other sites of metastatic disease identified within the abdomen or pelvis.       11/27/2014 - 02/11/2015 Chemotherapy   She received 4 cycles of carbo/taxol    03/01/2015 PET scan   Single hypermetabolic small retroperitoneal lymph node along the aorta. 2. No evidence of metastatic disease otherwise in the abdomen or pelvis. No evidence local recurrence. 3. Intensely hypermetabolic enlarged nodule adjacent to the RIGHT lobe of thyroid gland. This presumably represents the biopsied lesion in clinician report which was found to be benign thyroid tissue.   04/05/2015 - 05/13/2015 Radiation Therapy   She received 50.4 gray in 28 fractions with simultaneous integrated boost to 56 gray   06/17/2015 Imaging   No acute process or evidence of metastatic disease in the abdomen or pelvis. Resolution of previously described retroperitoneal adenopathy. 2.  Possible constipation. 3. Atherosclerosis.   06/17/2015 Tumor Marker   Patient's tumor was tested for the following markers: CA125 Results of the tumor marker test revealed 33   08/12/2015 Tumor Marker   Patient's tumor was tested for the following markers: CA125 Results of the tumor marker test revealed 52   08/31/2015 PET scan   Development of right paratracheal hypermetabolic adenopathy, consistent with nodal metastasis. 2. The previously described isolated abdominal retroperitoneal  hypermetabolic node has resolved. 3. Persistent hypermetabolic right thyroid nodule, per report previously biopsied. Correlate with those results. 4.  Possible constipation.   09/09/2015 -  Anti-estrogen oral therapy   She has been receiving alternative treatment between megace and tamoxifen   09/09/2015 Tumor Marker   Patient's tumor was tested for the following markers: CA125 Results of the tumor marker test revealed 37.8   09/20/2015 Procedure   Technically successful ultrasound-guided thyroid aspiration biopsy , dominant right nodule   09/20/2015 Pathology Results   THYROID, RIGHT, FINE NEEDLE ASPIRATION (SPECIMEN 1 OF 1 COLLECTED 09/20/15): FINDINGS CONSISTENT WITH BENIGN THYROID NODULE (BETHESDA CATEGORY II).   10/28/2015 Tumor Marker   Patient's tumor was tested for the following markers: CA125 Results of the tumor marker test revealed 53.2   11/04/2015 Imaging   Stable benign right thyroid nodule   12/16/2015 Tumor Marker   Patient's tumor was tested for the following markers: CA125 Results of the tumor marker test revealed 38.1   03/22/2016 PET scan   Interval progression of hypermetabolic right paratracheal lymph node consistent with metastatic involvement. 2. Stable hypermetabolic right thyroid nodule. Reportedly this has been biopsied in the past.   03/23/2016 Tumor Marker   Patient's tumor was tested for the following markers: CA125 Results of the tumor marker test revealed 62.4   04/13/2016 - 06/01/2016 Radiation Therapy   She received 56 Gy to the chest in 28 fractions    05/09/2016 Imaging   No evidence of left lower extremity deep vein thrombosis. No evidence of a superficial thrombosis of the greater and lesser saphenous veins. Positive for thrombus noted in several varicosities of the calf. No evidence of Baker's cyst on the left.   05/17/2016 Tumor Marker   Patient's tumor was tested for the following markers: CA125 Results of the tumor marker test revealed 9    06/29/2016 Tumor Marker   Patient's tumor was tested for the following markers: CA125 Results of the tumor marker test revealed 5.9   08/04/2016 Tumor Marker   Patient's tumor was tested for the following markers: CA125 Results of the tumor marker test revealed 6.0   09/12/2016 PET scan   Complete metabolic response to therapy, with resolution of  hypermetabolic mediastinal lymphadenopathy since prior exam. No residual or new metastatic disease identified. Stable hypermetabolic right thyroid lobe nodule, which was previously biopsied on 09/20/2015.   03/13/2017 PET scan   1. Solitary focus of recurrent right paratracheal hypermetabolic activity, with a 1.0 cm right lower paratracheal node having a maximum SUV of 9.0. Appearance compatible with recurrent malignancy. 2. Continued hypermetabolic right thyroid nodule, previously biopsied, presumed benign -correlate with prior biopsy results. 3. Other imaging findings of potential clinical significance: Aortic Atherosclerosis (ICD10-I70.0). Mild cardiomegaly. Prominent stool throughout the colon favors constipation.   04/02/2017 Pathology Results   FINE NEEDLE ASPIRATION, ENDOSCOPIC, (EBUS) 4 R NODE (SPECIMEN 1 OF 2 COLLECTED 04/02/17): MALIGNANT CELLS PRESENT, CONSISTENT WITH CARCINOMA. SEE COMMENT. COMMENT: THE MALIGNANT CELLS ARE POSITIVE FOR P53 AND NEGATIVE FOR ESTROGEN RECEPTOR AND TTF-1. THIS PROFILE IS NON-SPECIFIC, BUT THE P53 POSITIVE STAINING IS SUGGESTIVE OF GYNECOLOGIC PRIMARY.    04/11/2017 Procedure   Successful 8 French right internal jugular vein power port placement with its tip at the SVC/RA junction   04/12/2017 Imaging   Normal LV size with mild LV hypertrophy. EF 55-60%. Normal RV size and systolic function. Aortic valve sclerosis without significant stenosis.   04/18/2017 - 06/14/2017 Chemotherapy   The patient had chemotherapy with Doxil    07/10/2017 Imaging   - Left ventricle: The cavity size was normal. There was mild  concentric hypertrophy. Systolic function was normal. The estimated ejection fraction was in the range of 55% to 60%. Wall motion was normal; there were no regional wall motion abnormalities. Doppler parameters are consistent with abnormal left ventricular relaxation (grade 1 diastolic dysfunction). - Aortic valve: Noncoronary cusp mobility was mildly restricted. - Mitral valve: There was mild regurgitation. - Left atrium: The atrium was mildly dilated. - Atrial septum: No defect or patent foramen ovale was identified.  Impressions:  - Compared to November 2018, global LV longitudinal strain remains normal (has increased)   07/11/2017 PET scan   Increased size and hypermetabolic activity of 3.3 cm right thyroid lobe nodule. Thyroid carcinoma cannot be excluded. Recommend repeat ultrasound guided fine needle aspiration to exclude thyroid carcinoma.  New adjacent hypermetabolic 10 mm right supraclavicular lymph node, suspicious for lymph node metastasis.  Slight increase in size and hypermetabolic activity of solitary right paratracheal lymph node.  No evidence of abdominal or pelvic metastatic disease.   07/18/2017 Procedure   1. Technically successful ultrasound guided fine needle aspiration of indeterminate hypermetabolic right-sided thyroid nodule/mass. 2. Technically successful ultrasound-guided core needle biopsy of hypermetabolic right lower cervical lymph node.   07/18/2017 Pathology Results   THYROID, FINE NEEDLE ASPIRATION RIGHT (SPECIMEN 1 OF 1, COLLECTED ON 07/18/2017): ATYPIA OF UNDETERMINED SIGNIFICANCE OR FOLLICULAR LESION OF UNDETERMINED SIGNIFICANCE (BETHESDA CATEGORY III). SEE COMMENT. COMMENT: THE SPECIMEN CONSISTS OF SMALL AND MEDIUM SIZED GROUPS OF FOLLICULAR EPITHELIAL CELLS WITH MILD CYTOLOGIC ATYPIA INCLUDING NUCLEAR ENLARGEMENT AND HURTHLE CELL CHANGE. SOME GROUPS ARE ARRANGED AS MICROFOLLICLES. THERE IS MINIMAL BACKGROUND COLLOID. BASED ON THESE FEATURES, A FOLLICULAR  LESION/NEOPLASM CAN NOT BE ENTIRELY RULED OUT. A SPECIMEN WILL BE SENT FOR AFIRMA TESTING.   07/19/2017 Pathology Results   Lymph node, needle/core biopsy - METASTATIC PAPILLARY SEROUS CARCINOMA. - SEE COMMENT. Microscopic Comment Dr. Vicente Males has reviewed the case and concurs with this interpretation   10/15/2017 PET scan   No new or progressive disease. No evidence of abdominal or pelvic metastatic disease.  Stable hypermetabolic right thyroid lobe nodule and adjacent right supraclavicular lymph node.  Decreased size and hypermetabolic  activity of solitary right paratracheal lymph node.   01/23/2018 PET scan   1. Overall, no significant change from PET-CT of 3 months ago. There is persistent hypermetabolic activity at the right thoracic inlet and within a right paratracheal mediastinal node. The nodes have not significantly changed in size, although the metabolic activity has minimally increased over this interval. It is uncertain how much of the thoracic inlet metabolic activity is attributed to the thyroid nodule versus adjacent cervical lymph nodes, both previously biopsied. 2. No new hypermetabolic activity within the neck, chest, abdomen or pelvis. No evidence of local recurrence in the pelvis. 3. Stable probable radiation changes in the right lung.   04/16/2018 PET scan   1. Interval increase in metabolic activity of the RIGHT supraclavicular node and RIGHT lower paratracheal mediastinal node with minimal change in size. Findings consistent with persistent and mildly progressed metastatic adenopathy. 2. New hypermetabolic small lymph node in the RIGHT lower paratracheal nodal station adjacent to the previously followed node. 3. No evidence of local recurrence in the pelvis or new disease elsewhere.   07/30/2018 PET scan   1. Mild interval progression of right supraclavicular, mediastinal, and right hilar hypermetabolic metastatic disease. There is a new hypermetabolic lymph node in  the subcarinal station on today's study. 2. No hypermetabolic disease in the abdomen or pelvis to suggest recurrent/metastases. 3.  Aortic Atherosclerois (ICD10-170.0)     08/16/2018 -  Chemotherapy   The patient had Lenvima since 3/6 and pembrolizumab since 3/13 for chemotherapy treatment. Michel Santee was held temporarily due to infection and severe hypertension   10/31/2018 PET scan   Partial response to therapy, with decreased hypermetabolic lymphadenopathy in mediastinum and right hilar region.   No significant change in hypermetabolic lymphadenopathy in right inferior neck.   No new or progressive metastatic disease identified. No evidence of recurrent or metastatic carcinoma in the pelvis or abdomen   02/04/2019 PET scan   1. Decrease in metabolic activity of RIGHT supraclavicular node and RIGHT paratracheal lymph nodes. 2. Persistent activity in the RIGHT thyroid nodule unchanged. 3. Diffuse activity through the esophagus is favored esophagitis. No change. 4. Scattered foci of intense metabolic activity associated the bowel without focal lesion on CT favored benign. 5. No evidence of peritoneal metastasis. 6. No evidence of metastatic adenopathy in the abdomen pelvis. 7. No evidence of local pelvic sidewall recurrence.   07/30/2019 PET scan   1. Mildly increased uptake within small nodes along the right sternocleidomastoid without change in size and associated with increased deltoid activity may reflect changes related to right arm injection or recent COVID vaccination, consider clinical correlation and attention on follow-up. 2. Persistent activity in the right thyroid nodule, unchanged from previous exams. 3. New activity in a juxta esophageal lymph node in the chest is suspicious though the node is unchanged with respect to size. Endoscopic correlation could potentially be helpful or close attention on follow-up. 4. Subtle increased FDG uptake along the left mainstem bronchus and  adjacent to the aorta may represent a small lymph node. No discrete correlate is demonstrated on today's study.   10/22/2019 PET scan   1. Persistent FDG avid subcentimeter right supraclavicular and juxta esophageal lymph nodes. The degree of FDG uptake is similar to the previous exam. No new or progressive findings identified. 2. No findings of solid organ metastasis, skeletal metastasis or metastatic disease to the abdomen or pelvis. 3. FDG avid right lobe of thyroid gland nodule is again noted This nodule is not incidental and  been worked up previously with 2 biopsies. Please refer to results from the most recent biopsy dated 07/18/2017. (Ref: J Am Coll Radiol. 2015 Feb;12(2): 143-50). 4.  Aortic Atherosclerosis (ICD10-I70.0).   05/05/2020 PET scan   1. Progressive disease, as evidenced by increased hypermetabolism within right supraclavicular and mediastinal nodes. 2. No infradiaphragmatic hypermetabolic metastasis. 3.  Aortic Atherosclerosis (ICD10-I70.0). 4. Hypermetabolic right thyroid nodule has been detailed on prior exams and was biopsied on 07/18/2017   09/10/2020 PET scan   1. Interval improvement in previously demonstrated hypermetabolic mediastinal and right supraclavicular adenopathy. A single hypermetabolic left paraesophageal lymph node remains, improved from previous study. The other nodes have resolved.  2. No progressive disease. 3. Diffuse esophageal hypermetabolic activity suggesting esophagitis. 4. Hypermetabolic right thyroid nodule has been previously biopsied.   03/18/2021 PET scan   1. No significant change in single residual FDG avid left paraesophageal lymph node. This has an SUV max of 7.03, compared with 6.4 previously. No new sites of disease identified. 2. Diffuse esophageal hypermetabolic activity suggestive of esophagitis. 3. FDG avid right lobe of thyroid gland nodule. This has been biopsied previously. (Ref: J Am Coll Radiol. 2015 Feb;12(2): 143-50).      09/22/2021 PET scan   1. Persistent mild diffuse FDG uptake in the esophagus likely chronic inflammation. 2. Resolution of the left paraesophageal lymph node seen on the prior study. 3. Stable hypermetabolic right thyroid nodule, previously biopsied. 4. No findings for recurrent abdominal/pelvic disease or metastatic disease.     02/23/2022 -  Chemotherapy   Patient is on Treatment Plan : UTERINE Lenvatinib (20) D1-21 + Pembrolizumab (200) D1 q21d     05/04/2022 PET scan   1. Status post hysterectomy without evidence of hypermetabolic local recurrence or hypermetabolic metastatic disease. 2. Persistent diffuse long segment hypermetabolic esophageal activity, likely reflects chronic esophagitis. Consider further evaluation with endoscopy if not previously performed. 3.  Aortic Atherosclerosis (ICD10-I70.0).     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 Lenvima was held temporarily due to infection and severe hypertension     PHYSICAL EXAMINATION: ECOG PERFORMANCE STATUS: 1 - Symptomatic but completely ambulatory  Vitals:   07/27/22 0847  BP: 124/68  Pulse: 70  Resp: 18  SpO2: 98%   Filed Weights   07/27/22 0847  Weight: 155 lb 6.4 oz (70.5 kg)    GENERAL:alert, no distress and comfortable  NEURO: alert & oriented x 3 with fluent speech, no focal motor/sensory deficits  LABORATORY DATA:  I have reviewed the data as listed    Component Value Date/Time   NA 136 07/27/2022 0818   NA 141 05/17/2017 0957   K 4.1 07/27/2022 0818   K 3.6 05/17/2017 0957   CL 100 07/27/2022 0818   CL 105 11/19/2012 0848   CO2 29 07/27/2022 0818   CO2 25 05/17/2017 0957   GLUCOSE 86 07/27/2022 0818   GLUCOSE 85 05/17/2017 0957   GLUCOSE 122 (H) 11/19/2012 0848   BUN 19 07/27/2022 0818    BUN 15.2 05/17/2017 0957   CREATININE 0.81 07/27/2022 0818   CREATININE 0.8 05/17/2017 0957   CALCIUM 9.5 07/27/2022 0818   CALCIUM 9.6 05/17/2017 0957   PROT 7.2 07/27/2022 0818   PROT 7.0 05/17/2017 0957   ALBUMIN 4.0 07/27/2022 0818   ALBUMIN 3.9 05/17/2017 0957  AST 21 07/27/2022 0818   AST 25 05/17/2017 0957   ALT 18 07/27/2022 0818   ALT 18 05/17/2017 0957   ALKPHOS 49 07/27/2022 0818   ALKPHOS 57 05/17/2017 0957   BILITOT 0.5 07/27/2022 0818   BILITOT 0.47 05/17/2017 0957   GFRNONAA >60 07/27/2022 0818   GFRAA >60 02/26/2020 0940   GFRAA >60 07/10/2019 1240    No results found for: "SPEP", "UPEP"  Lab Results  Component Value Date   WBC 4.1 07/27/2022   NEUTROABS 2.5 07/27/2022   HGB 11.2 (L) 07/27/2022   HCT 33.0 (L) 07/27/2022   MCV 96.2 07/27/2022   PLT 198 07/27/2022      Chemistry      Component Value Date/Time   NA 136 07/27/2022 0818   NA 141 05/17/2017 0957   K 4.1 07/27/2022 0818   K 3.6 05/17/2017 0957   CL 100 07/27/2022 0818   CL 105 11/19/2012 0848   CO2 29 07/27/2022 0818   CO2 25 05/17/2017 0957   BUN 19 07/27/2022 0818   BUN 15.2 05/17/2017 0957   CREATININE 0.81 07/27/2022 0818   CREATININE 0.8 05/17/2017 0957   GLU 158 (H) 01/14/2015 1035      Component Value Date/Time   CALCIUM 9.5 07/27/2022 0818   CALCIUM 9.6 05/17/2017 0957   ALKPHOS 49 07/27/2022 0818   ALKPHOS 57 05/17/2017 0957   AST 21 07/27/2022 0818   AST 25 05/17/2017 0957   ALT 18 07/27/2022 0818   ALT 18 05/17/2017 0957   BILITOT 0.5 07/27/2022 0818   BILITOT 0.47 05/17/2017 0957

## 2022-07-28 ENCOUNTER — Other Ambulatory Visit: Payer: Self-pay

## 2022-07-28 ENCOUNTER — Other Ambulatory Visit: Payer: Self-pay | Admitting: Hematology and Oncology

## 2022-07-28 ENCOUNTER — Telehealth: Payer: Self-pay

## 2022-07-28 ENCOUNTER — Other Ambulatory Visit (HOSPITAL_COMMUNITY): Payer: Self-pay

## 2022-07-28 LAB — TSH: TSH: 2.815 u[IU]/mL (ref 0.350–4.500)

## 2022-07-28 MED ORDER — LEVOTHYROXINE SODIUM 100 MCG PO TABS
100.0000 ug | ORAL_TABLET | Freq: Every day | ORAL | 1 refills | Status: DC
  Filled 2022-07-28: qty 60, 60d supply, fill #0
  Filled 2022-09-17 – 2022-09-26 (×2): qty 60, 60d supply, fill #1

## 2022-07-28 NOTE — Telephone Encounter (Signed)
-----   Message from [name], MD sent at 07/28/2022  8:33 AM EST ----- I just refilled her synthroid

## 2022-07-28 NOTE — Telephone Encounter (Signed)
Called and left a message Synthroid RX sent to [facility]. Ask her to call the office for questions.

## 2022-07-29 ENCOUNTER — Other Ambulatory Visit: Payer: Self-pay

## 2022-08-03 ENCOUNTER — Other Ambulatory Visit (HOSPITAL_COMMUNITY): Payer: Self-pay

## 2022-08-04 ENCOUNTER — Other Ambulatory Visit (HOSPITAL_COMMUNITY): Payer: Self-pay

## 2022-08-05 ENCOUNTER — Other Ambulatory Visit: Payer: Self-pay

## 2022-08-08 ENCOUNTER — Other Ambulatory Visit (HOSPITAL_COMMUNITY): Payer: Self-pay

## 2022-08-08 DIAGNOSIS — C541 Malignant neoplasm of endometrium: Secondary | ICD-10-CM | POA: Diagnosis not present

## 2022-08-08 DIAGNOSIS — E7800 Pure hypercholesterolemia, unspecified: Secondary | ICD-10-CM | POA: Diagnosis not present

## 2022-08-08 DIAGNOSIS — E663 Overweight: Secondary | ICD-10-CM | POA: Diagnosis not present

## 2022-08-08 DIAGNOSIS — Z0000 Encounter for general adult medical examination without abnormal findings: Secondary | ICD-10-CM | POA: Diagnosis not present

## 2022-08-08 DIAGNOSIS — I10 Essential (primary) hypertension: Secondary | ICD-10-CM | POA: Diagnosis not present

## 2022-08-08 DIAGNOSIS — Z6827 Body mass index (BMI) 27.0-27.9, adult: Secondary | ICD-10-CM | POA: Diagnosis not present

## 2022-08-08 DIAGNOSIS — F411 Generalized anxiety disorder: Secondary | ICD-10-CM | POA: Diagnosis not present

## 2022-08-09 ENCOUNTER — Other Ambulatory Visit (HOSPITAL_COMMUNITY): Payer: Self-pay

## 2022-08-17 ENCOUNTER — Inpatient Hospital Stay: Payer: 59 | Attending: Hematology and Oncology

## 2022-08-17 ENCOUNTER — Inpatient Hospital Stay: Payer: 59

## 2022-08-17 VITALS — BP 134/74 | HR 60 | Temp 97.9°F | Resp 18 | Ht 64.0 in | Wt 153.8 lb

## 2022-08-17 DIAGNOSIS — C541 Malignant neoplasm of endometrium: Secondary | ICD-10-CM | POA: Insufficient documentation

## 2022-08-17 DIAGNOSIS — Z79899 Other long term (current) drug therapy: Secondary | ICD-10-CM | POA: Insufficient documentation

## 2022-08-17 DIAGNOSIS — Z95828 Presence of other vascular implants and grafts: Secondary | ICD-10-CM

## 2022-08-17 DIAGNOSIS — I10 Essential (primary) hypertension: Secondary | ICD-10-CM

## 2022-08-17 DIAGNOSIS — E039 Hypothyroidism, unspecified: Secondary | ICD-10-CM

## 2022-08-17 DIAGNOSIS — Z5112 Encounter for antineoplastic immunotherapy: Secondary | ICD-10-CM | POA: Diagnosis not present

## 2022-08-17 LAB — CBC WITH DIFFERENTIAL (CANCER CENTER ONLY)
Abs Immature Granulocytes: 0.01 10*3/uL (ref 0.00–0.07)
Basophils Absolute: 0.1 10*3/uL (ref 0.0–0.1)
Basophils Relative: 1 %
Eosinophils Absolute: 0.3 10*3/uL (ref 0.0–0.5)
Eosinophils Relative: 6 %
HCT: 34.3 % — ABNORMAL LOW (ref 36.0–46.0)
Hemoglobin: 11.4 g/dL — ABNORMAL LOW (ref 12.0–15.0)
Immature Granulocytes: 0 %
Lymphocytes Relative: 16 %
Lymphs Abs: 0.8 10*3/uL (ref 0.7–4.0)
MCH: 32.4 pg (ref 26.0–34.0)
MCHC: 33.2 g/dL (ref 30.0–36.0)
MCV: 97.4 fL (ref 80.0–100.0)
Monocytes Absolute: 0.3 10*3/uL (ref 0.1–1.0)
Monocytes Relative: 6 %
Neutro Abs: 3.6 10*3/uL (ref 1.7–7.7)
Neutrophils Relative %: 71 %
Platelet Count: 197 10*3/uL (ref 150–400)
RBC: 3.52 MIL/uL — ABNORMAL LOW (ref 3.87–5.11)
RDW: 13.1 % (ref 11.5–15.5)
WBC Count: 5.2 10*3/uL (ref 4.0–10.5)
nRBC: 0 % (ref 0.0–0.2)

## 2022-08-17 LAB — TOTAL PROTEIN, URINE DIPSTICK: Protein, ur: NEGATIVE mg/dL

## 2022-08-17 LAB — CMP (CANCER CENTER ONLY)
ALT: 18 U/L (ref 0–44)
AST: 21 U/L (ref 15–41)
Albumin: 4.1 g/dL (ref 3.5–5.0)
Alkaline Phosphatase: 44 U/L (ref 38–126)
Anion gap: 7 (ref 5–15)
BUN: 16 mg/dL (ref 8–23)
CO2: 28 mmol/L (ref 22–32)
Calcium: 9.8 mg/dL (ref 8.9–10.3)
Chloride: 103 mmol/L (ref 98–111)
Creatinine: 0.82 mg/dL (ref 0.44–1.00)
GFR, Estimated: >60 mL/min (ref >60)
Glucose, Bld: 82 mg/dL (ref 70–99)
Potassium: 3.9 mmol/L (ref 3.5–5.1)
Sodium: 138 mmol/L (ref 135–145)
Total Bilirubin: 0.5 mg/dL (ref 0.3–1.2)
Total Protein: 7.2 g/dL (ref 6.5–8.1)

## 2022-08-17 LAB — TSH: TSH: 4.291 u[IU]/mL (ref 0.350–4.500)

## 2022-08-17 MED ORDER — SODIUM CHLORIDE 0.9% FLUSH
10.0000 mL | INTRAVENOUS | Status: DC | PRN
  Administered 2022-08-17: 10 mL

## 2022-08-17 MED ORDER — SODIUM CHLORIDE 0.9% FLUSH
10.0000 mL | Freq: Once | INTRAVENOUS | Status: AC
  Administered 2022-08-17: 10 mL

## 2022-08-17 MED ORDER — SODIUM CHLORIDE 0.9 % IV SOLN
400.0000 mg | Freq: Once | INTRAVENOUS | Status: AC
  Administered 2022-08-17: 400 mg via INTRAVENOUS
  Filled 2022-08-17: qty 16

## 2022-08-17 MED ORDER — SODIUM CHLORIDE 0.9 % IV SOLN: Freq: Once | INTRAVENOUS | Status: AC

## 2022-08-17 MED ORDER — HEPARIN SOD (PORK) LOCK FLUSH 100 UNIT/ML IV SOLN
500.0000 [IU] | Freq: Once | INTRAVENOUS | Status: AC | PRN
  Administered 2022-08-17: 500 [IU]

## 2022-08-17 NOTE — Patient Instructions (Signed)
[facility]  Discharge Instructions: Thank you for choosing [facility] to provide your oncology and hematology care.   If you have a lab appointment with the Cancer Center, please go directly to the Cancer Center and check in at the registration area.   Wear comfortable clothing and clothing appropriate for easy access to any Portacath or PICC line.   We strive to give you quality time with your provider. You may need to reschedule your appointment if you arrive late (15 or more minutes).  Arriving late affects you and other patients whose appointments are after yours.  Also, if you miss three or more appointments without notifying the office, you may be dismissed from the clinic at the provider's discretion.      For prescription refill requests, have your pharmacy contact our office and allow 72 hours for refills to be completed.    Today you received the following chemotherapy and/or immunotherapy agents keytruda      To help prevent nausea and vomiting after your treatment, we encourage you to take your nausea medication as directed.  BELOW ARE SYMPTOMS THAT SHOULD BE REPORTED IMMEDIATELY: *FEVER GREATER THAN 100.4 F (38 C) OR HIGHER *CHILLS OR SWEATING *NAUSEA AND VOMITING THAT IS NOT CONTROLLED WITH YOUR NAUSEA MEDICATION *UNUSUAL SHORTNESS OF BREATH *UNUSUAL BRUISING OR BLEEDING *URINARY PROBLEMS (pain or burning when urinating, or frequent urination) *BOWEL PROBLEMS (unusual diarrhea, constipation, pain near the anus) TENDERNESS IN MOUTH AND THROAT WITH OR WITHOUT PRESENCE OF ULCERS (sore throat, sores in mouth, or a toothache) UNUSUAL RASH, SWELLING OR PAIN  UNUSUAL VAGINAL DISCHARGE OR ITCHING   Items with * indicate a potential emergency and should be followed up as soon as possible or go to the Emergency Department if any problems should occur.  Please show the CHEMOTHERAPY ALERT CARD or IMMUNOTHERAPY ALERT CARD at  check-in to the Emergency Department and triage nurse.  Should you have questions after your visit or need to cancel or reschedule your appointment, please contact [facility]  Dept: [phone]  and follow the prompts.  Office hours are 8:00 a.m. to 4:30 p.m. Monday - Friday. Please note that voicemails left after 4:00 p.m. may not be returned until the following business day.  We are closed weekends and major holidays. You have access to a nurse at all times for urgent questions. Please call the main number to the clinic Dept: [phone] and follow the prompts.   For any non-urgent questions, you may also contact your provider using MyChart. We now offer e-Visits for anyone 18 and older to request care online for non-urgent symptoms. For details visit [email or web address].   Also download the MyChart app! Go to the app store, search "MyChart", open the app, select [facility], and log in with your MyChart username and password.   

## 2022-08-29 DIAGNOSIS — Z961 Presence of intraocular lens: Secondary | ICD-10-CM | POA: Diagnosis not present

## 2022-08-29 DIAGNOSIS — H04123 Dry eye syndrome of bilateral lacrimal glands: Secondary | ICD-10-CM | POA: Diagnosis not present

## 2022-09-04 ENCOUNTER — Other Ambulatory Visit: Payer: Self-pay | Admitting: Hematology and Oncology

## 2022-09-04 ENCOUNTER — Other Ambulatory Visit: Payer: Self-pay

## 2022-09-04 MED ORDER — HYDROCHLOROTHIAZIDE 12.5 MG PO CAPS
12.5000 mg | ORAL_CAPSULE | Freq: Every day | ORAL | 3 refills | Status: DC
  Filled 2022-09-04: qty 90, 90d supply, fill #0
  Filled 2022-12-04: qty 90, 90d supply, fill #1
  Filled 2023-03-14: qty 90, 90d supply, fill #2

## 2022-09-05 ENCOUNTER — Other Ambulatory Visit (HOSPITAL_COMMUNITY): Payer: Self-pay

## 2022-09-15 ENCOUNTER — Other Ambulatory Visit (HOSPITAL_COMMUNITY): Payer: Self-pay

## 2022-09-15 ENCOUNTER — Other Ambulatory Visit: Payer: Self-pay

## 2022-09-17 ENCOUNTER — Other Ambulatory Visit: Payer: Self-pay | Admitting: Hematology and Oncology

## 2022-09-18 ENCOUNTER — Other Ambulatory Visit (HOSPITAL_COMMUNITY): Payer: Self-pay

## 2022-09-18 ENCOUNTER — Other Ambulatory Visit: Payer: Self-pay

## 2022-09-18 MED ORDER — ONDANSETRON HCL 8 MG PO TABS
8.0000 mg | ORAL_TABLET | Freq: Three times a day (TID) | ORAL | 3 refills | Status: DC | PRN
  Filled 2022-09-18: qty 30, 10d supply, fill #0
  Filled 2023-07-31: qty 30, 10d supply, fill #1

## 2022-09-18 MED ORDER — AMLODIPINE BESYLATE 10 MG PO TABS
10.0000 mg | ORAL_TABLET | Freq: Every day | ORAL | 1 refills | Status: DC
  Filled 2022-09-18: qty 90, 90d supply, fill #0
  Filled 2022-12-17: qty 90, 90d supply, fill #1

## 2022-09-26 ENCOUNTER — Other Ambulatory Visit (HOSPITAL_COMMUNITY): Payer: Self-pay

## 2022-09-26 ENCOUNTER — Other Ambulatory Visit: Payer: Self-pay

## 2022-09-27 ENCOUNTER — Other Ambulatory Visit (HOSPITAL_COMMUNITY): Payer: Self-pay

## 2022-09-27 MED ORDER — BUSPIRONE HCL 10 MG PO TABS
ORAL_TABLET | ORAL | 1 refills | Status: DC
  Filled 2022-09-27: qty 180, 90d supply, fill #0
  Filled 2023-01-01: qty 180, 90d supply, fill #1

## 2022-09-28 ENCOUNTER — Inpatient Hospital Stay: Payer: 59 | Attending: Hematology and Oncology | Admitting: Hematology and Oncology

## 2022-09-28 ENCOUNTER — Inpatient Hospital Stay: Payer: 59

## 2022-09-28 ENCOUNTER — Other Ambulatory Visit: Payer: Self-pay | Admitting: *Deleted

## 2022-09-28 ENCOUNTER — Other Ambulatory Visit: Payer: Self-pay

## 2022-09-28 ENCOUNTER — Encounter: Payer: Self-pay | Admitting: Hematology and Oncology

## 2022-09-28 VITALS — BP 130/72 | HR 75 | Resp 18 | Ht 64.0 in | Wt 152.8 lb

## 2022-09-28 DIAGNOSIS — E039 Hypothyroidism, unspecified: Secondary | ICD-10-CM

## 2022-09-28 DIAGNOSIS — C541 Malignant neoplasm of endometrium: Secondary | ICD-10-CM | POA: Diagnosis not present

## 2022-09-28 DIAGNOSIS — E538 Deficiency of other specified B group vitamins: Secondary | ICD-10-CM | POA: Diagnosis not present

## 2022-09-28 DIAGNOSIS — Z79620 Long term (current) use of immunosuppressive biologic: Secondary | ICD-10-CM | POA: Insufficient documentation

## 2022-09-28 DIAGNOSIS — Z5112 Encounter for antineoplastic immunotherapy: Secondary | ICD-10-CM | POA: Insufficient documentation

## 2022-09-28 LAB — CBC WITH DIFFERENTIAL (CANCER CENTER ONLY)
Abs Immature Granulocytes: 0.01 10*3/uL (ref 0.00–0.07)
Basophils Absolute: 0.1 10*3/uL (ref 0.0–0.1)
Basophils Relative: 1 %
Eosinophils Absolute: 0.4 10*3/uL (ref 0.0–0.5)
Eosinophils Relative: 6 %
HCT: 33.1 % — ABNORMAL LOW (ref 36.0–46.0)
Hemoglobin: 11.1 g/dL — ABNORMAL LOW (ref 12.0–15.0)
Immature Granulocytes: 0 %
Lymphocytes Relative: 14 %
Lymphs Abs: 0.8 10*3/uL (ref 0.7–4.0)
MCH: 32.1 pg (ref 26.0–34.0)
MCHC: 33.5 g/dL (ref 30.0–36.0)
MCV: 95.7 fL (ref 80.0–100.0)
Monocytes Absolute: 0.4 10*3/uL (ref 0.1–1.0)
Monocytes Relative: 7 %
Neutro Abs: 4 10*3/uL (ref 1.7–7.7)
Neutrophils Relative %: 72 %
Platelet Count: 215 10*3/uL (ref 150–400)
RBC: 3.46 MIL/uL — ABNORMAL LOW (ref 3.87–5.11)
RDW: 13.7 % (ref 11.5–15.5)
WBC Count: 5.6 10*3/uL (ref 4.0–10.5)
nRBC: 0 % (ref 0.0–0.2)

## 2022-09-28 LAB — CMP (CANCER CENTER ONLY)
ALT: 17 U/L (ref 0–44)
AST: 21 U/L (ref 15–41)
Albumin: 4.2 g/dL (ref 3.5–5.0)
Alkaline Phosphatase: 47 U/L (ref 38–126)
Anion gap: 9 (ref 5–15)
BUN: 24 mg/dL — ABNORMAL HIGH (ref 8–23)
CO2: 28 mmol/L (ref 22–32)
Calcium: 10 mg/dL (ref 8.9–10.3)
Chloride: 100 mmol/L (ref 98–111)
Creatinine: 0.93 mg/dL (ref 0.44–1.00)
GFR, Estimated: >60 mL/min (ref >60)
Glucose, Bld: 93 mg/dL (ref 70–99)
Potassium: 4.2 mmol/L (ref 3.5–5.1)
Sodium: 137 mmol/L (ref 135–145)
Total Bilirubin: 0.6 mg/dL (ref 0.3–1.2)
Total Protein: 7.5 g/dL (ref 6.5–8.1)

## 2022-09-28 LAB — TSH: TSH: 3.596 u[IU]/mL (ref 0.350–4.500)

## 2022-09-28 MED ORDER — SODIUM CHLORIDE 0.9 % IV SOLN: Freq: Once | INTRAVENOUS | Status: AC

## 2022-09-28 MED ORDER — SODIUM CHLORIDE 0.9 % IV SOLN
400.0000 mg | Freq: Once | INTRAVENOUS | Status: AC
  Administered 2022-09-28: 400 mg via INTRAVENOUS
  Filled 2022-09-28: qty 16

## 2022-09-28 MED ORDER — HEPARIN SOD (PORK) LOCK FLUSH 100 UNIT/ML IV SOLN
500.0000 [IU] | Freq: Once | INTRAVENOUS | Status: AC | PRN
  Administered 2022-09-28: 500 [IU]

## 2022-09-28 MED ORDER — SODIUM CHLORIDE 0.9% FLUSH
10.0000 mL | INTRAVENOUS | Status: DC | PRN
  Administered 2022-09-28: 10 mL

## 2022-09-28 NOTE — Progress Notes (Signed)
[facility] OFFICE PROGRESS NOTE  Patient Care Team: Sigmund Hazel, MD as PCP - General (Family Medicine) Adrian Prince, MD as Consulting Physician (Endocrinology)  ASSESSMENT & PLAN:  Endometrial cancer [facility]) Her last PET CT scan is normal Previously noted left paraesophageal lymphadenopathy that was previously treated with radiation therapy has normalized since April of 2023 I recommend we continue surveillance imaging study every 6 months and if she has no signs of disease progression in April 2025, we can consider discontinuation of systemic treatment She is in agreement with the plan of care So far, she tolerated treatment well without any side effects I recommend changing her treatment to every 6 weeks in the future and she is in agreement I plan to repeat imaging study again in June  Vitamin B12 deficiency She will continue monthly vitamin B12 injection  Acquired hypothyroidism She has intermittent elevated TSH I will adjust her thyroid medicine accordingly  Orders Placed This Encounter  Procedures   NM PET Image Restage (PS) Skull Base to Thigh (F-18 FDG)    Standing Status:   Future    Standing Expiration Date:   09/28/2023    Order Specific Question:   If indicated for the ordered procedure, I authorize the administration of a radiopharmaceutical per Radiology protocol    Answer:   Yes    Order Specific Question:   Preferred imaging location?    Answer:   [facility]    Order Specific Question:   Radiology Contrast Protocol - do NOT remove file path    Answer:   [email or web address]   CBC with Differential (Cancer Center Only)    Standing Status:   Future    Standing Expiration Date:   11/09/2023   CMP (Cancer Center only)    Standing Status:   Future    Standing Expiration Date:   11/09/2023    All questions were answered. The patient knows to call the clinic with any problems, questions or concerns. The  total time spent in the appointment was 20 minutes encounter with patients including review of chart and various tests results, discussions about plan of care and coordination of care plan   Artis Delay, MD 09/28/2022 11:19 AM  INTERVAL HISTORY: Please see below for problem oriented charting. she returns for treatment follow-up on pembrolizumab and Lenvima She had recent sinus infection of viral gastroenteritis that has since resolved Her blood pressure at home is normal She has no new lymphadenopathy in the head and neck region  REVIEW OF SYSTEMS:   Constitutional: Denies fevers, chills or abnormal weight loss Eyes: Denies blurriness of vision Ears, nose, mouth, throat, and face: Denies mucositis or sore throat Respiratory: Denies cough, dyspnea or wheezes Cardiovascular: Denies palpitation, chest discomfort or lower extremity swelling Gastrointestinal:  Denies nausea, heartburn or change in bowel habits Skin: Denies abnormal skin rashes Lymphatics: Denies new lymphadenopathy or easy bruising Neurological:Denies numbness, tingling or new weaknesses Behavioral/Psych: Mood is stable, no new changes  All other systems were reviewed with the patient and are negative.  I have reviewed the past medical history, past surgical history, social history and family history with the patient and they are unchanged from previous note.  ALLERGIES:  is allergic to lenvima [lenvatinib].  MEDICATIONS:  Current Outpatient Medications  Medication Sig Dispense Refill   amLODipine (NORVASC) 10 MG tablet TAKE 1 TABLET BY MOUTH ONCE DAILY. **DECREASE SIMVASTATIN TO 20 MG DAILY WHILE ON AMLODIPINE PER MD** 90 tablet 1   busPIRone (BUSPAR) 10 MG  tablet Take 1 tablet by mouth 2 times a day 180 tablet 3   busPIRone (BUSPAR) 10 MG tablet Take 1 tablet by mouth twice daily. 180 tablet 1   Cholecalciferol (VITAMIN D3) 2000 units TABS Take 2,000 Units by mouth daily.      cyanocobalamin (DODEX) 1000 MCG/ML  injection Inject 1 mL into the muscle every 30 days. 1 mL 11   hydrochlorothiazide (MICROZIDE) 12.5 MG capsule Take 1 capsule (12.5 mg total) by mouth daily. 90 capsule 3   lenvatinib 10 mg daily dose (LENVIMA) capsule TAKE 1 CAPSULE (10 MG TOTAL) BY MOUTH DAILY. 30 each 11   levothyroxine (SYNTHROID) 100 MCG tablet Take 1 tablet (100 mcg total) by mouth daily before breakfast. 60 tablet 1   losartan (COZAAR) 25 MG tablet TAKE 1 TABLET BY MOUTH DAILY 90 tablet 3   metoprolol tartrate (LOPRESSOR) 25 MG tablet TAKE 1 TABLET BY MOUTH TWICE DAILY 180 tablet 3   ondansetron (ZOFRAN) 8 MG tablet Take 1 tablet (8 mg total) by mouth every 8 (eight) hours as needed for nausea. 30 tablet 3   pantoprazole (PROTONIX) 40 MG tablet TAKE 1 TABLET BY MOUTH ONCE DAILY 90 tablet 3   simvastatin (ZOCOR) 20 MG tablet Take 1 tablet by mouth daily in the evening. 90 tablet 3   sodium fluoride (PREVIDENT 5000 PLUS) 1.1 % CREA dental cream Use as directed 51 g 12   SYRINGE-NEEDLE, DISP, 3 ML 25G X 5/8" 3 ML MISC Use as directed to administer B12 shot every 30 days. 12 each 0   tacrolimus (PROTOPIC) 0.1 % ointment Apply to affected area on the skin twice a day 30 g 0   venlafaxine XR (EFFEXOR XR) 150 MG 24 hr capsule Take 2 capsules (300 mg total) by mouth every morning. Take with food. 180 capsule 3   No current facility-administered medications for this visit.   Facility-Administered Medications Ordered in Other Visits  Medication Dose Route Frequency Provider Last Rate Last Admin   heparin lock flush 100 unit/mL  500 Units Intracatheter Once PRN Bertis Ruddy, Tamasha Laplante, MD       pembrolizumab (KEYTRUDA) 400 mg in sodium chloride 0.9 % 50 mL chemo infusion  400 mg Intravenous Once Bertis Ruddy, Kue Fox, MD       sodium chloride flush (NS) 0.9 % injection 10 mL  10 mL Intracatheter PRN Artis Delay, MD        SUMMARY OF ONCOLOGIC HISTORY: Oncology History Overview Note  [employer] testing done 11-2015 on surgical path from 2014:     MS stable   TMB low  4 muts/mb   ATM [id number]   ERBB3 T389K   E2H2 rearrangement exon 9   PPP2R1A P179R   TP53 I195N    ER- APPROXIMATELY 25-35% STAINING IN NEOPLASTIC CELLS (INTERMEDIATE)  PR- APPROXIMATELY 25-35% STAINING IN NEOPLASTIC CELLS (STRONG)  Repeat biopsy 04/02/17: ER negative, Her 2 negative  Progressed on Doxil   Endometrial cancer  06/20/2012 Pathology Results   Biopsy positive for papillary serous carcinoma   06/20/2012 Genetic Testing   [employer] One testing done 11-2015 on surgical path from 2014:    MS stable   TMB low  4 muts/mb   ATM [id number]   ERBB3 T389K   E2H2 rearrangement exon 9   PPP2R1A P179R   TP53 I195N    06/20/2012 Initial Diagnosis   Patient presented to PCP with intermittent vaginal bleeding since ~ Oct 2013, endometrial biopsy 06-20-12 with complex endometrial hyperplasia with atypia   06/26/2012  Imaging   Thickened endometrial lining in a postmenopausal patient experiencing vaginal bleeding. In the setting of post-menopausal bleeding, endometrial sampling is indicated to exclude carcinoma. No focal myometrial abnormalities are seen.  Normal left ovary and non-visualized right ovary   07/30/2012 Surgery   Dr. Clifton James performed robotic hysterectomy with bilateral salpingo-oophorectomy, bilateral pelvic lymph node dissection and periaortic lymph node dissection. Intraoperatively on frozen section, the patient was noted to have a large endometrial polyp with changes within the polyp consistent for high-grade malignancy, possibly papillary serous carcinoma. There is no obvious extrauterine disease noted.     07/30/2012 Pathology Results   [phone] SUPPLEMENTAL REPORT  THE ENDOMETRIAL CARCINOMA WAS ANALYZED FOR DNA MISMATCH REPAIR PROTEINS.  IMMUNOHISTOCHEMICALLY, THE NEOPLASM RETAINED NUCLEAR EXPRESSION OF 4 GENE PRODUCTS, MLH1, MSH2, MSH6, AND PMS2, INVOLVED IN DNA MISMATCH REPAIR.  POSITIVE AND NEGATIVE CONTROLS WORKED APPROPRIATELY.  PER  REQUEST, AN ER AND PR ARE PERFORMED ON BLOCK 1I.  ER- APPROXIMATELY 25-35% STAINING IN NEOPLASTIC CELLS (INTERMEDIATE)  PR- APPROXIMATELY 25-35% STAINING IN NEOPLASTIC CELLS (STRONG)  ER AND PR PREDOMINANTLY SHOW STAINING IN THE SEROUS COMPONENT.  DIAGNOSIS  1. UTERUS, CERVIX WITH BILATERAL FALLOPIAN TUBES AND OVARIES,  HYSTERECTOMY AND BILATERAL SALPINGO-OOPHORECTOMY:  HIGH GRADE/POORLY DIFFERENTIATED ENDOMETRIAL ADENOCARCINOMA WITH SOLID AND SEROUS PAPILLARY COMPONENTS.  HISTOPATHOLOGIC TYPE:    A VARIETY OF PATTERNS WERE PRESENT, ALL OF WHICH SHOULD BE CONSIDERED TO BE HIGH GRADE.  SEROUS PAPILLARY CARCINOMA WAS SEEN OCCURRING ADJACENT TO SOLID ENDOMETRIAL CARCINOMA WITH MARKED ANAPLASIA AND GIANT CELLS. SIZE: TUMOR MEASURED AT LEAST 4.8 CM IN GREATEST HORIZONTAL DIMENSION.  GRADE: POORLY DIFFERENTIATED OR HIGH GRADE. DEPTH OF INVASION: NO DEFINITE MYOMETRIAL INVOLVEMENT WAS SEEN.  THE TUMOR APPEARED CONFINED TO THE POLYP AS WELL AS SURFACE ENDOMETRIUM. WHERE MYOMETRIAL THICKNESS NOT APPLICABLE. SEROSAL INVOLVEMENT:       NOT DEMONSTRATED.  ENDOCERVICAL INVOLVEMENT:  NOT DEMONSTRATED. RESECTION MARGINS:         FREE OF INVOLVEMENT. EXTRAUTERINE EXTENSION:    NOT DEMONSTRATED. ANGIOLYMPHATIC INVASION:   NOT DEFINITELY SEEN. TOTAL NODES EXAMINED:      22.    PELVIC NODES EXAMINED:  20.    PELVIC NODES INVOLVED:  0.    PARA-AORTIC NODES       2. EXAMINED:    PARA-AORTIC NODES       0. INVOLVED TNM STAGE:                 T1A N0 MX. AJCC STAGE GROUPING:       IA. FIGO STAGE:                IA.   08/26/2012 Imaging   No CT evidence for intra-abdominal or pelvic metastatic disease. Trace free pelvic fluid, presumably postoperative although the date of surgery is not documented in the electronic medical record.   08/26/2012 - 12/13/2012 Chemotherapy   She received 6 cycles of carbo/taxol    10/29/2012 - 11/27/2012 Radiation Therapy   May 27, June 5,  June 11, June 19, November 27, 2012:  Proximal vagina 30 Gy in 5 fractions      01/17/2013 Imaging   1.  No evidence of recurrent or metastatic disease. 2.  No acute abnormality involving the abdomen or pelvis. 3.  Mild diffuse hepatic steatosis. 4.  Very small supraumbilical midline anterior abdominal wall hernia containing fat, unchanged   01/22/2014 Imaging   No evidence for metastatic or recurrent disease. 2. No bowel obstruction.  Normal appendix. 3. Small fat containing hernia, stable in  appearance. 4. Status post hysterectomy and bilateral oophorectomy   02/19/2014 Imaging   No pulmonary lesions are identified. The abnormality on the chest x-ray is due to asymmetric left-sided sternoclavicular joint degenerative disease   10/12/2014 Imaging   New mild retroperitoneal lymphadenopathy in the left paraaortic region and proximal left common iliac chain, consistent with metastatic disease. No other sites of metastatic disease identified within the abdomen or pelvis.       11/27/2014 - 02/11/2015 Chemotherapy   She received 4 cycles of carbo/taxol    03/01/2015 PET scan   Single hypermetabolic small retroperitoneal lymph node along the aorta. 2. No evidence of metastatic disease otherwise in the abdomen or pelvis. No evidence local recurrence. 3. Intensely hypermetabolic enlarged nodule adjacent to the RIGHT lobe of thyroid gland. This presumably represents the biopsied lesion in clinician report which was found to be benign thyroid tissue.   04/05/2015 - 05/13/2015 Radiation Therapy   She received 50.4 gray in 28 fractions with simultaneous integrated boost to 56 gray   06/17/2015 Imaging   No acute process or evidence of metastatic disease in the abdomen or pelvis. Resolution of previously described retroperitoneal adenopathy. 2.  Possible constipation. 3. Atherosclerosis.   06/17/2015 Tumor Marker   Patient's tumor was tested for the following markers: CA125 Results of the tumor marker test revealed 33   08/12/2015 Tumor Marker    Patient's tumor was tested for the following markers: CA125 Results of the tumor marker test revealed 52   08/31/2015 PET scan   Development of right paratracheal hypermetabolic adenopathy, consistent with nodal metastasis. 2. The previously described isolated abdominal retroperitoneal hypermetabolic node has resolved. 3. Persistent hypermetabolic right thyroid nodule, per report previously biopsied. Correlate with those results. 4.  Possible constipation.   09/09/2015 -  Anti-estrogen oral therapy   She has been receiving alternative treatment between megace and tamoxifen   09/09/2015 Tumor Marker   Patient's tumor was tested for the following markers: CA125 Results of the tumor marker test revealed 37.8   09/20/2015 Procedure   Technically successful ultrasound-guided thyroid aspiration biopsy , dominant right nodule   09/20/2015 Pathology Results   THYROID, RIGHT, FINE NEEDLE ASPIRATION (SPECIMEN 1 OF 1 COLLECTED 09/20/15): FINDINGS CONSISTENT WITH BENIGN THYROID NODULE (BETHESDA CATEGORY II).   10/28/2015 Tumor Marker   Patient's tumor was tested for the following markers: CA125 Results of the tumor marker test revealed 53.2   11/04/2015 Imaging   Stable benign right thyroid nodule   12/16/2015 Tumor Marker   Patient's tumor was tested for the following markers: CA125 Results of the tumor marker test revealed 38.1   03/22/2016 PET scan   Interval progression of hypermetabolic right paratracheal lymph node consistent with metastatic involvement. 2. Stable hypermetabolic right thyroid nodule. Reportedly this has been biopsied in the past.   03/23/2016 Tumor Marker   Patient's tumor was tested for the following markers: CA125 Results of the tumor marker test revealed 62.4   04/13/2016 - 06/01/2016 Radiation Therapy   She received 56 Gy to the chest in 28 fractions    05/09/2016 Imaging   No evidence of left lower extremity deep vein thrombosis. No evidence of a superficial thrombosis  of the greater and lesser saphenous veins. Positive for thrombus noted in several varicosities of the calf. No evidence of Baker's cyst on the left.   05/17/2016 Tumor Marker   Patient's tumor was tested for the following markers: CA125 Results of the tumor marker test revealed 9   06/29/2016 Tumor Marker  Patient's tumor was tested for the following markers: CA125 Results of the tumor marker test revealed 5.9   08/04/2016 Tumor Marker   Patient's tumor was tested for the following markers: CA125 Results of the tumor marker test revealed 6.0   09/12/2016 PET scan   Complete metabolic response to therapy, with resolution of hypermetabolic mediastinal lymphadenopathy since prior exam. No residual or new metastatic disease identified. Stable hypermetabolic right thyroid lobe nodule, which was previously biopsied on 09/20/2015.   03/13/2017 PET scan   1. Solitary focus of recurrent right paratracheal hypermetabolic activity, with a 1.0 cm right lower paratracheal node having a maximum SUV of 9.0. Appearance compatible with recurrent malignancy. 2. Continued hypermetabolic right thyroid nodule, previously biopsied, presumed benign -correlate with prior biopsy results. 3. Other imaging findings of potential clinical significance: Aortic Atherosclerosis (ICD10-I70.0). Mild cardiomegaly. Prominent stool throughout the colon favors constipation.   04/02/2017 Pathology Results   FINE NEEDLE ASPIRATION, ENDOSCOPIC, (EBUS) 4 R NODE (SPECIMEN 1 OF 2 COLLECTED 04/02/17): MALIGNANT CELLS PRESENT, CONSISTENT WITH CARCINOMA. SEE COMMENT. COMMENT: THE MALIGNANT CELLS ARE POSITIVE FOR P53 AND NEGATIVE FOR ESTROGEN RECEPTOR AND TTF-1. THIS PROFILE IS NON-SPECIFIC, BUT THE P53 POSITIVE STAINING IS SUGGESTIVE OF GYNECOLOGIC PRIMARY.    04/11/2017 Procedure   Successful 8 French right internal jugular vein power port placement with its tip at the SVC/RA junction   04/12/2017 Imaging   Normal LV size with mild  LV hypertrophy. EF 55-60%. Normal RV size and systolic function. Aortic valve sclerosis without significant stenosis.   04/18/2017 - 06/14/2017 Chemotherapy   The patient had chemotherapy with Doxil    07/10/2017 Imaging   - Left ventricle: The cavity size was normal. There was mild concentric hypertrophy. Systolic function was normal. The estimated ejection fraction was in the range of 55% to 60%. Wall motion was normal; there were no regional wall motion abnormalities. Doppler parameters are consistent with abnormal left ventricular relaxation (grade 1 diastolic dysfunction). - Aortic valve: Noncoronary cusp mobility was mildly restricted. - Mitral valve: There was mild regurgitation. - Left atrium: The atrium was mildly dilated. - Atrial septum: No defect or patent foramen ovale was identified.  Impressions:  - Compared to November 2018, global LV longitudinal strain remains normal (has increased)   07/11/2017 PET scan   Increased size and hypermetabolic activity of 3.3 cm right thyroid lobe nodule. Thyroid carcinoma cannot be excluded. Recommend repeat ultrasound guided fine needle aspiration to exclude thyroid carcinoma.  New adjacent hypermetabolic 10 mm right supraclavicular lymph node, suspicious for lymph node metastasis.  Slight increase in size and hypermetabolic activity of solitary right paratracheal lymph node.  No evidence of abdominal or pelvic metastatic disease.   07/18/2017 Procedure   1. Technically successful ultrasound guided fine needle aspiration of indeterminate hypermetabolic right-sided thyroid nodule/mass. 2. Technically successful ultrasound-guided core needle biopsy of hypermetabolic right lower cervical lymph node.   07/18/2017 Pathology Results   THYROID, FINE NEEDLE ASPIRATION RIGHT (SPECIMEN 1 OF 1, COLLECTED ON 07/18/2017): ATYPIA OF UNDETERMINED SIGNIFICANCE OR FOLLICULAR LESION OF UNDETERMINED SIGNIFICANCE (BETHESDA CATEGORY III). SEE COMMENT. COMMENT:  THE SPECIMEN CONSISTS OF SMALL AND MEDIUM SIZED GROUPS OF FOLLICULAR EPITHELIAL CELLS WITH MILD CYTOLOGIC ATYPIA INCLUDING NUCLEAR ENLARGEMENT AND HURTHLE CELL CHANGE. SOME GROUPS ARE ARRANGED AS MICROFOLLICLES. THERE IS MINIMAL BACKGROUND COLLOID. BASED ON THESE FEATURES, A FOLLICULAR LESION/NEOPLASM CAN NOT BE ENTIRELY RULED OUT. A SPECIMEN WILL BE SENT FOR AFIRMA TESTING.   07/19/2017 Pathology Results   Lymph node, needle/core biopsy - METASTATIC PAPILLARY SEROUS  CARCINOMA. - SEE COMMENT. Microscopic Comment Dr. Valinda Hoar has reviewed the case and concurs with this interpretation   10/15/2017 PET scan   No new or progressive disease. No evidence of abdominal or pelvic metastatic disease.  Stable hypermetabolic right thyroid lobe nodule and adjacent right supraclavicular lymph node.  Decreased size and hypermetabolic activity of solitary right paratracheal lymph node.   01/23/2018 PET scan   1. Overall, no significant change from PET-CT of 3 months ago. There is persistent hypermetabolic activity at the right thoracic inlet and within a right paratracheal mediastinal node. The nodes have not significantly changed in size, although the metabolic activity has minimally increased over this interval. It is uncertain how much of the thoracic inlet metabolic activity is attributed to the thyroid nodule versus adjacent cervical lymph nodes, both previously biopsied. 2. No new hypermetabolic activity within the neck, chest, abdomen or pelvis. No evidence of local recurrence in the pelvis. 3. Stable probable radiation changes in the right lung.   04/16/2018 PET scan   1. Interval increase in metabolic activity of the RIGHT supraclavicular node and RIGHT lower paratracheal mediastinal node with minimal change in size. Findings consistent with persistent and mildly progressed metastatic adenopathy. 2. New hypermetabolic small lymph node in the RIGHT lower paratracheal nodal station adjacent to the  previously followed node. 3. No evidence of local recurrence in the pelvis or new disease elsewhere.   07/30/2018 PET scan   1. Mild interval progression of right supraclavicular, mediastinal, and right hilar hypermetabolic metastatic disease. There is a new hypermetabolic lymph node in the subcarinal station on today's study. 2. No hypermetabolic disease in the abdomen or pelvis to suggest recurrent/metastases. 3.  Aortic Atherosclerois (ICD10-170.0)     08/16/2018 -  Chemotherapy   The patient had Lenvima since 3/6 and pembrolizumab since 3/13 for chemotherapy treatment. Assunta Curtis was held temporarily due to infection and severe hypertension   10/31/2018 PET scan   Partial response to therapy, with decreased hypermetabolic lymphadenopathy in mediastinum and right hilar region.   No significant change in hypermetabolic lymphadenopathy in right inferior neck.   No new or progressive metastatic disease identified. No evidence of recurrent or metastatic carcinoma in the pelvis or abdomen   02/04/2019 PET scan   1. Decrease in metabolic activity of RIGHT supraclavicular node and RIGHT paratracheal lymph nodes. 2. Persistent activity in the RIGHT thyroid nodule unchanged. 3. Diffuse activity through the esophagus is favored esophagitis. No change. 4. Scattered foci of intense metabolic activity associated the bowel without focal lesion on CT favored benign. 5. No evidence of peritoneal metastasis. 6. No evidence of metastatic adenopathy in the abdomen pelvis. 7. No evidence of local pelvic sidewall recurrence.   07/30/2019 PET scan   1. Mildly increased uptake within small nodes along the right sternocleidomastoid without change in size and associated with increased deltoid activity may reflect changes related to right arm injection or recent COVID vaccination, consider clinical correlation and attention on follow-up. 2. Persistent activity in the right thyroid nodule, unchanged from previous  exams. 3. New activity in a juxta esophageal lymph node in the chest is suspicious though the node is unchanged with respect to size. Endoscopic correlation could potentially be helpful or close attention on follow-up. 4. Subtle increased FDG uptake along the left mainstem bronchus and adjacent to the aorta may represent a small lymph node. No discrete correlate is demonstrated on today's study.   10/22/2019 PET scan   1. Persistent FDG avid subcentimeter right supraclavicular and  juxta esophageal lymph nodes. The degree of FDG uptake is similar to the previous exam. No new or progressive findings identified. 2. No findings of solid organ metastasis, skeletal metastasis or metastatic disease to the abdomen or pelvis. 3. FDG avid right lobe of thyroid gland nodule is again noted This nodule is not incidental and been worked up previously with 2 biopsies. Please refer to results from the most recent biopsy dated 07/18/2017. (Ref: J Am Coll Radiol. 2015 Feb;12(2): 143-50). 4.  Aortic Atherosclerosis (ICD10-I70.0).   05/05/2020 PET scan   1. Progressive disease, as evidenced by increased hypermetabolism within right supraclavicular and mediastinal nodes. 2. No infradiaphragmatic hypermetabolic metastasis. 3.  Aortic Atherosclerosis (ICD10-I70.0). 4. Hypermetabolic right thyroid nodule has been detailed on prior exams and was biopsied on 07/18/2017   09/10/2020 PET scan   1. Interval improvement in previously demonstrated hypermetabolic mediastinal and right supraclavicular adenopathy. A single hypermetabolic left paraesophageal lymph node remains, improved from previous study. The other nodes have resolved.  2. No progressive disease. 3. Diffuse esophageal hypermetabolic activity suggesting esophagitis. 4. Hypermetabolic right thyroid nodule has been previously biopsied.   03/18/2021 PET scan   1. No significant change in single residual FDG avid left paraesophageal lymph node. This has an SUV max of  7.03, compared with 6.4 previously. No new sites of disease identified. 2. Diffuse esophageal hypermetabolic activity suggestive of esophagitis. 3. FDG avid right lobe of thyroid gland nodule. This has been biopsied previously. (Ref: J Am Coll Radiol. 2015 Feb;12(2): 143-50).     09/22/2021 PET scan   1. Persistent mild diffuse FDG uptake in the esophagus likely chronic inflammation. 2. Resolution of the left paraesophageal lymph node seen on the prior study. 3. Stable hypermetabolic right thyroid nodule, previously biopsied. 4. No findings for recurrent abdominal/pelvic disease or metastatic disease.     02/23/2022 -  Chemotherapy   Patient is on Treatment Plan : UTERINE Lenvatinib (20) D1-21 + Pembrolizumab (200) D1 q21d     05/04/2022 PET scan   1. Status post hysterectomy without evidence of hypermetabolic local recurrence or hypermetabolic metastatic disease. 2. Persistent diffuse long segment hypermetabolic esophageal activity, likely reflects chronic esophagitis. Consider further evaluation with endoscopy if not previously performed. 3.  Aortic Atherosclerosis (ICD10-I70.0).     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 Lenvima was held temporarily due to infection and severe hypertension     PHYSICAL EXAMINATION: ECOG PERFORMANCE STATUS: 1 - Symptomatic but completely ambulatory  Vitals:   09/28/22 1022  BP: 130/72  Pulse: 75  Resp: 18  SpO2: 99%   Filed Weights   09/28/22 1022  Weight: 152 lb 12.8 oz (69.3 kg)    GENERAL:alert, no distress and comfortable  NEURO: alert & oriented x 3 with fluent speech, no focal motor/sensory deficits  LABORATORY DATA:  I have reviewed the data as listed    Component Value Date/Time   NA 137 09/28/2022 0949   NA 141 05/17/2017  0957   K 4.2 09/28/2022 0949   K 3.6 05/17/2017 0957   CL 100 09/28/2022 0949   CL 105 11/19/2012 0848   CO2 28 09/28/2022 0949   CO2 25 05/17/2017 0957   GLUCOSE 93 09/28/2022 0949   GLUCOSE 85 05/17/2017 0957   GLUCOSE 122 (H) 11/19/2012 0848   BUN 24 (H) 09/28/2022 1610  BUN 15.2 05/17/2017 0957   CREATININE 0.93 09/28/2022 0949   CREATININE 0.8 05/17/2017 0957   CALCIUM 10.0 09/28/2022 0949   CALCIUM 9.6 05/17/2017 0957   PROT 7.5 09/28/2022 0949   PROT 7.0 05/17/2017 0957   ALBUMIN 4.2 09/28/2022 0949   ALBUMIN 3.9 05/17/2017 0957   AST 21 09/28/2022 0949   AST 25 05/17/2017 0957   ALT 17 09/28/2022 0949   ALT 18 05/17/2017 0957   ALKPHOS 47 09/28/2022 0949   ALKPHOS 57 05/17/2017 0957   BILITOT 0.6 09/28/2022 0949   BILITOT 0.47 05/17/2017 0957   GFRNONAA >60 09/28/2022 0949   GFRAA >60 02/26/2020 0940   GFRAA >60 07/10/2019 1240    No results found for: "SPEP", "UPEP"  Lab Results  Component Value Date   WBC 5.6 09/28/2022   NEUTROABS 4.0 09/28/2022   HGB 11.1 (L) 09/28/2022   HCT 33.1 (L) 09/28/2022   MCV 95.7 09/28/2022   PLT 215 09/28/2022      Chemistry      Component Value Date/Time   NA 137 09/28/2022 0949   NA 141 05/17/2017 0957   K 4.2 09/28/2022 0949   K 3.6 05/17/2017 0957   CL 100 09/28/2022 0949   CL 105 11/19/2012 0848   CO2 28 09/28/2022 0949   CO2 25 05/17/2017 0957   BUN 24 (H) 09/28/2022 0949   BUN 15.2 05/17/2017 0957   CREATININE 0.93 09/28/2022 0949   CREATININE 0.8 05/17/2017 0957   GLU 158 (H) 01/14/2015 1035      Component Value Date/Time   CALCIUM 10.0 09/28/2022 0949   CALCIUM 9.6 05/17/2017 0957   ALKPHOS 47 09/28/2022 0949   ALKPHOS 57 05/17/2017 0957   AST 21 09/28/2022 0949   AST 25 05/17/2017 0957   ALT 17 09/28/2022 0949   ALT 18 05/17/2017 0957   BILITOT 0.6 09/28/2022 0949   BILITOT 0.47 05/17/2017 0957

## 2022-09-28 NOTE — Patient Instructions (Signed)

## 2022-09-28 NOTE — Assessment & Plan Note (Signed)
She has intermittent elevated TSH ?I will adjust her thyroid medicine accordingly ?

## 2022-09-28 NOTE — Assessment & Plan Note (Signed)
She will continue monthly vitamin B12 injection 

## 2022-09-28 NOTE — Patient Instructions (Signed)
[facility]  Discharge Instructions: Thank you for choosing [facility] to provide your oncology and hematology care.   If you have a lab appointment with the Cancer Center, please go directly to the Cancer Center and check in at the registration area.   Wear comfortable clothing and clothing appropriate for easy access to any Portacath or PICC line.   We strive to give you quality time with your provider. You may need to reschedule your appointment if you arrive late (15 or more minutes).  Arriving late affects you and other patients whose appointments are after yours.  Also, if you miss three or more appointments without notifying the office, you may be dismissed from the clinic at the provider's discretion.      For prescription refill requests, have your pharmacy contact our office and allow 72 hours for refills to be completed.    Today you received the following chemotherapy and/or immunotherapy agents: Keytruda      To help prevent nausea and vomiting after your treatment, we encourage you to take your nausea medication as directed.  BELOW ARE SYMPTOMS THAT SHOULD BE REPORTED IMMEDIATELY: *FEVER GREATER THAN 100.4 F (38 C) OR HIGHER *CHILLS OR SWEATING *NAUSEA AND VOMITING THAT IS NOT CONTROLLED WITH YOUR NAUSEA MEDICATION *UNUSUAL SHORTNESS OF BREATH *UNUSUAL BRUISING OR BLEEDING *URINARY PROBLEMS (pain or burning when urinating, or frequent urination) *BOWEL PROBLEMS (unusual diarrhea, constipation, pain near the anus) TENDERNESS IN MOUTH AND THROAT WITH OR WITHOUT PRESENCE OF ULCERS (sore throat, sores in mouth, or a toothache) UNUSUAL RASH, SWELLING OR PAIN  UNUSUAL VAGINAL DISCHARGE OR ITCHING   Items with * indicate a potential emergency and should be followed up as soon as possible or go to the Emergency Department if any problems should occur.  Please show the CHEMOTHERAPY ALERT CARD or IMMUNOTHERAPY ALERT CARD at  check-in to the Emergency Department and triage nurse.  Should you have questions after your visit or need to cancel or reschedule your appointment, please contact [facility]  Dept: [phone]  and follow the prompts.  Office hours are 8:00 a.m. to 4:30 p.m. Monday - Friday. Please note that voicemails left after 4:00 p.m. may not be returned until the following business day.  We are closed weekends and major holidays. You have access to a nurse at all times for urgent questions. Please call the main number to the clinic Dept: [phone] and follow the prompts.   For any non-urgent questions, you may also contact your provider using MyChart. We now offer e-Visits for anyone 18 and older to request care online for non-urgent symptoms. For details visit [email or web address].   Also download the MyChart app! Go to the app store, search "MyChart", open the app, select [facility], and log in with your MyChart username and password.   

## 2022-09-28 NOTE — Assessment & Plan Note (Signed)
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

## 2022-09-29 ENCOUNTER — Other Ambulatory Visit: Payer: Self-pay

## 2022-10-05 ENCOUNTER — Other Ambulatory Visit (HOSPITAL_COMMUNITY): Payer: Self-pay

## 2022-10-05 DIAGNOSIS — E041 Nontoxic single thyroid nodule: Secondary | ICD-10-CM | POA: Diagnosis not present

## 2022-10-05 DIAGNOSIS — D61810 Antineoplastic chemotherapy induced pancytopenia: Secondary | ICD-10-CM | POA: Diagnosis not present

## 2022-10-05 DIAGNOSIS — C541 Malignant neoplasm of endometrium: Secondary | ICD-10-CM | POA: Diagnosis not present

## 2022-10-05 DIAGNOSIS — I10 Essential (primary) hypertension: Secondary | ICD-10-CM | POA: Diagnosis not present

## 2022-10-05 DIAGNOSIS — M8580 Other specified disorders of bone density and structure, unspecified site: Secondary | ICD-10-CM | POA: Diagnosis not present

## 2022-10-05 DIAGNOSIS — I700 Atherosclerosis of aorta: Secondary | ICD-10-CM | POA: Diagnosis not present

## 2022-10-05 DIAGNOSIS — E785 Hyperlipidemia, unspecified: Secondary | ICD-10-CM | POA: Diagnosis not present

## 2022-10-06 ENCOUNTER — Other Ambulatory Visit: Payer: Self-pay

## 2022-10-09 ENCOUNTER — Other Ambulatory Visit: Payer: Self-pay

## 2022-10-11 ENCOUNTER — Telehealth: Payer: Self-pay

## 2022-10-11 NOTE — Telephone Encounter (Signed)
Called and moved appt on 6/6 to 2 pm with Dr. Bertis Ruddy. She is aware of appts on 6/6.

## 2022-10-12 ENCOUNTER — Other Ambulatory Visit: Payer: Self-pay

## 2022-10-13 ENCOUNTER — Other Ambulatory Visit (HOSPITAL_COMMUNITY): Payer: Self-pay

## 2022-10-16 ENCOUNTER — Other Ambulatory Visit (HOSPITAL_COMMUNITY): Payer: Self-pay

## 2022-10-18 ENCOUNTER — Other Ambulatory Visit (HOSPITAL_COMMUNITY): Payer: Self-pay

## 2022-10-18 ENCOUNTER — Other Ambulatory Visit: Payer: Self-pay

## 2022-10-18 MED ORDER — SIMVASTATIN 20 MG PO TABS
20.0000 mg | ORAL_TABLET | Freq: Every evening | ORAL | 4 refills | Status: DC
  Filled 2022-10-18: qty 90, 90d supply, fill #0
  Filled 2023-01-29: qty 90, 90d supply, fill #1
  Filled 2023-05-03: qty 90, 90d supply, fill #2
  Filled 2023-07-31: qty 90, 90d supply, fill #3
  Filled 2023-08-11: qty 90, 90d supply, fill #4

## 2022-10-22 ENCOUNTER — Other Ambulatory Visit: Payer: Self-pay

## 2022-10-23 ENCOUNTER — Other Ambulatory Visit (HOSPITAL_COMMUNITY): Payer: Self-pay

## 2022-10-23 ENCOUNTER — Other Ambulatory Visit: Payer: Self-pay | Admitting: Hematology and Oncology

## 2022-10-23 ENCOUNTER — Other Ambulatory Visit: Payer: Self-pay

## 2022-10-23 MED ORDER — LOSARTAN POTASSIUM 25 MG PO TABS
25.0000 mg | ORAL_TABLET | Freq: Every day | ORAL | 3 refills | Status: DC
  Filled 2022-10-23: qty 90, 90d supply, fill #0
  Filled 2023-02-07: qty 90, 90d supply, fill #1
  Filled 2023-05-14: qty 90, 90d supply, fill #2
  Filled 2023-08-11: qty 90, 90d supply, fill #3

## 2022-11-02 ENCOUNTER — Other Ambulatory Visit (HOSPITAL_COMMUNITY): Payer: Self-pay

## 2022-11-06 ENCOUNTER — Other Ambulatory Visit (HOSPITAL_COMMUNITY): Payer: Self-pay

## 2022-11-07 ENCOUNTER — Encounter (HOSPITAL_COMMUNITY)
Admission: RE | Admit: 2022-11-07 | Discharge: 2022-11-07 | Disposition: A | Discharge status: Home / Self Care | Payer: 59 | Source: Ambulatory Visit | Attending: Hematology and Oncology | Admitting: Hematology and Oncology

## 2022-11-07 DIAGNOSIS — E041 Nontoxic single thyroid nodule: Secondary | ICD-10-CM | POA: Insufficient documentation

## 2022-11-07 DIAGNOSIS — K209 Esophagitis, unspecified without bleeding: Secondary | ICD-10-CM | POA: Insufficient documentation

## 2022-11-07 DIAGNOSIS — I700 Atherosclerosis of aorta: Secondary | ICD-10-CM | POA: Insufficient documentation

## 2022-11-07 DIAGNOSIS — R9389 Abnormal findings on diagnostic imaging of other specified body structures: Secondary | ICD-10-CM | POA: Insufficient documentation

## 2022-11-07 DIAGNOSIS — C541 Malignant neoplasm of endometrium: Secondary | ICD-10-CM

## 2022-11-07 LAB — GLUCOSE, CAPILLARY: Glucose-Capillary: 93 mg/dL (ref 70–99)

## 2022-11-07 MED ORDER — FLUDEOXYGLUCOSE F - 18 (FDG) INJECTION
7.6000 | Freq: Once | INTRAVENOUS | Status: AC
  Administered 2022-11-07: 7.6 via INTRAVENOUS

## 2022-11-09 ENCOUNTER — Ambulatory Visit: Payer: 59 | Admitting: Hematology and Oncology

## 2022-11-09 ENCOUNTER — Inpatient Hospital Stay: Payer: 59

## 2022-11-09 ENCOUNTER — Other Ambulatory Visit: Payer: 59

## 2022-11-09 ENCOUNTER — Encounter: Payer: Self-pay | Admitting: Hematology and Oncology

## 2022-11-09 ENCOUNTER — Other Ambulatory Visit (HOSPITAL_COMMUNITY): Payer: Self-pay

## 2022-11-09 ENCOUNTER — Inpatient Hospital Stay (HOSPITAL_BASED_OUTPATIENT_CLINIC_OR_DEPARTMENT_OTHER): Payer: 59 | Admitting: Hematology and Oncology

## 2022-11-09 ENCOUNTER — Inpatient Hospital Stay: Payer: 59 | Attending: Hematology and Oncology

## 2022-11-09 VITALS — BP 130/82 | HR 75 | Temp 98.0°F | Resp 17 | Wt 152.5 lb

## 2022-11-09 VITALS — BP 119/75 | HR 70 | Resp 18 | Ht 64.0 in | Wt 152.0 lb

## 2022-11-09 DIAGNOSIS — K2100 Gastro-esophageal reflux disease with esophagitis, without bleeding: Secondary | ICD-10-CM | POA: Diagnosis not present

## 2022-11-09 DIAGNOSIS — Z5112 Encounter for antineoplastic immunotherapy: Secondary | ICD-10-CM | POA: Insufficient documentation

## 2022-11-09 DIAGNOSIS — B372 Candidiasis of skin and nail: Secondary | ICD-10-CM | POA: Diagnosis not present

## 2022-11-09 DIAGNOSIS — C541 Malignant neoplasm of endometrium: Secondary | ICD-10-CM

## 2022-11-09 DIAGNOSIS — C770 Secondary and unspecified malignant neoplasm of lymph nodes of head, face and neck: Secondary | ICD-10-CM | POA: Insufficient documentation

## 2022-11-09 DIAGNOSIS — I10 Essential (primary) hypertension: Secondary | ICD-10-CM

## 2022-11-09 DIAGNOSIS — Z95828 Presence of other vascular implants and grafts: Secondary | ICD-10-CM

## 2022-11-09 DIAGNOSIS — Z79620 Long term (current) use of immunosuppressive biologic: Secondary | ICD-10-CM | POA: Diagnosis not present

## 2022-11-09 DIAGNOSIS — E039 Hypothyroidism, unspecified: Secondary | ICD-10-CM

## 2022-11-09 LAB — CBC WITH DIFFERENTIAL (CANCER CENTER ONLY)
Abs Immature Granulocytes: 0 10*3/uL (ref 0.00–0.07)
Basophils Absolute: 0.1 10*3/uL (ref 0.0–0.1)
Basophils Relative: 1 %
Eosinophils Absolute: 0.3 10*3/uL (ref 0.0–0.5)
Eosinophils Relative: 8 %
HCT: 34 % — ABNORMAL LOW (ref 36.0–46.0)
Hemoglobin: 11.3 g/dL — ABNORMAL LOW (ref 12.0–15.0)
Immature Granulocytes: 0 %
Lymphocytes Relative: 20 %
Lymphs Abs: 0.8 10*3/uL (ref 0.7–4.0)
MCH: 31.7 pg (ref 26.0–34.0)
MCHC: 33.2 g/dL (ref 30.0–36.0)
MCV: 95.5 fL (ref 80.0–100.0)
Monocytes Absolute: 0.3 10*3/uL (ref 0.1–1.0)
Monocytes Relative: 6 %
Neutro Abs: 2.6 10*3/uL (ref 1.7–7.7)
Neutrophils Relative %: 65 %
Platelet Count: 179 10*3/uL (ref 150–400)
RBC: 3.56 MIL/uL — ABNORMAL LOW (ref 3.87–5.11)
RDW: 13.5 % (ref 11.5–15.5)
WBC Count: 4.1 10*3/uL (ref 4.0–10.5)
nRBC: 0 % (ref 0.0–0.2)

## 2022-11-09 LAB — CMP (CANCER CENTER ONLY)
ALT: 18 U/L (ref 0–44)
AST: 24 U/L (ref 15–41)
Albumin: 4.3 g/dL (ref 3.5–5.0)
Alkaline Phosphatase: 49 U/L (ref 38–126)
Anion gap: 6 (ref 5–15)
BUN: 19 mg/dL (ref 8–23)
CO2: 30 mmol/L (ref 22–32)
Calcium: 9.9 mg/dL (ref 8.9–10.3)
Chloride: 101 mmol/L (ref 98–111)
Creatinine: 0.87 mg/dL (ref 0.44–1.00)
GFR, Estimated: >60 mL/min (ref >60)
Glucose, Bld: 107 mg/dL — ABNORMAL HIGH (ref 70–99)
Potassium: 4.2 mmol/L (ref 3.5–5.1)
Sodium: 137 mmol/L (ref 135–145)
Total Bilirubin: 0.5 mg/dL (ref 0.3–1.2)
Total Protein: 7.4 g/dL (ref 6.5–8.1)

## 2022-11-09 LAB — TSH: TSH: 0.657 u[IU]/mL (ref 0.350–4.500)

## 2022-11-09 LAB — TOTAL PROTEIN, URINE DIPSTICK: Protein, ur: NEGATIVE mg/dL

## 2022-11-09 MED ORDER — SODIUM CHLORIDE 0.9% FLUSH
10.0000 mL | INTRAVENOUS | Status: DC | PRN
  Administered 2022-11-09: 10 mL

## 2022-11-09 MED ORDER — HEPARIN SOD (PORK) LOCK FLUSH 100 UNIT/ML IV SOLN
500.0000 [IU] | Freq: Once | INTRAVENOUS | Status: AC | PRN
  Administered 2022-11-09: 500 [IU]

## 2022-11-09 MED ORDER — SODIUM CHLORIDE 0.9% FLUSH
10.0000 mL | Freq: Once | INTRAVENOUS | Status: AC
  Administered 2022-11-09: 10 mL

## 2022-11-09 MED ORDER — SODIUM CHLORIDE 0.9 % IV SOLN
400.0000 mg | Freq: Once | INTRAVENOUS | Status: AC
  Administered 2022-11-09: 400 mg via INTRAVENOUS
  Filled 2022-11-09: qty 16

## 2022-11-09 MED ORDER — MICONAZOLE NITRATE 2 % EX POWD
Freq: Two times a day (BID) | CUTANEOUS | 1 refills | Status: DC | PRN
  Filled 2022-11-09: qty 85, fill #0

## 2022-11-09 MED ORDER — SODIUM CHLORIDE 0.9 % IV SOLN: Freq: Once | INTRAVENOUS | Status: AC

## 2022-11-09 NOTE — Progress Notes (Signed)
[facility] OFFICE PROGRESS NOTE  Patient Care Team: Sigmund Hazel, MD as PCP - General (Family Medicine) Adrian Prince, MD as Consulting Physician (Endocrinology)  ASSESSMENT & PLAN:  Endometrial cancer [facility]) Her PET CT scan is normal Previously noted left paraesophageal lymphadenopathy that was previously treated with radiation therapy has normalized since April of 2023 I recommend we continue surveillance imaging study every 6 months and if she has no signs of disease progression in April 2025, we can consider discontinuation of systemic treatment She is in agreement with the plan of care So far, she tolerated treatment well without any side effects Due to slight abnormalities noted in her esophagus, recommend GI referral and she agreed  Esophagitis, reflux She has history of GERD and I suspect that could be the cause of abnormal PET/CT imaging but I recommend GI referral to evaluate and she agreed  Yeast infection of the skin Could be due to sweating  I recommend keeping his skin dry I sent prescription for topical antifungal powder  Orders Placed This Encounter  Procedures   CBC with Differential (Cancer Center Only)    Standing Status:   Future    Standing Expiration Date:   12/21/2023   CMP (Cancer Center only)    Standing Status:   Future    Standing Expiration Date:   12/21/2023   CBC with Differential (Cancer Center Only)    Standing Status:   Future    Standing Expiration Date:   02/01/2024   CMP (Cancer Center only)    Standing Status:   Future    Standing Expiration Date:   02/01/2024   Ambulatory referral to Gastroenterology    Referral Priority:   Routine    Referral Type:   Consultation    Referral Reason:   Specialty Services Required    Referred to Provider:   Charlott Rakes, MD    Number of Visits Requested:   1    All questions were answered. The patient knows to call the clinic with any problems, questions or concerns. The total time  spent in the appointment was 30 minutes encounter with patients including review of chart and various tests results, discussions about plan of care and coordination of care plan   Artis Delay, MD 11/09/2022 2:11 PM  INTERVAL HISTORY: Please see below for problem oriented charting. she returns for treatment follow-up Tolerated recent treatment well She denies signs or symptoms of esophagitis We discussed PET/CT imaging result She developed recent yeast infection under the bra line  REVIEW OF SYSTEMS:   Constitutional: Denies fevers, chills or abnormal weight loss Eyes: Denies blurriness of vision Ears, nose, mouth, throat, and face: Denies mucositis or sore throat Respiratory: Denies cough, dyspnea or wheezes Cardiovascular: Denies palpitation, chest discomfort or lower extremity swelling Gastrointestinal:  Denies nausea, heartburn or change in bowel habits Lymphatics: Denies new lymphadenopathy or easy bruising Neurological:Denies numbness, tingling or new weaknesses Behavioral/Psych: Mood is stable, no new changes  All other systems were reviewed with the patient and are negative.  I have reviewed the past medical history, past surgical history, social history and family history with the patient and they are unchanged from previous note.  ALLERGIES:  is allergic to lenvima [lenvatinib].  MEDICATIONS:  Current Outpatient Medications  Medication Sig Dispense Refill   miconazole (MICOTIN) 2 % powder Apply topically 2 (two) times daily as needed for itching. 85 g 1   amLODipine (NORVASC) 10 MG tablet TAKE 1 TABLET BY MOUTH ONCE DAILY. **DECREASE SIMVASTATIN  TO 20 MG DAILY WHILE ON AMLODIPINE PER MD** 90 tablet 1   busPIRone (BUSPAR) 10 MG tablet Take 1 tablet by mouth twice daily. 180 tablet 1   Cholecalciferol (VITAMIN D3) 2000 units TABS Take 2,000 Units by mouth daily.      cyanocobalamin (DODEX) 1000 MCG/ML injection Inject 1 mL into the muscle every 30 days. 1 mL 11    hydrochlorothiazide (MICROZIDE) 12.5 MG capsule Take 1 capsule (12.5 mg total) by mouth daily. 90 capsule 3   lenvatinib 10 mg daily dose (LENVIMA) capsule TAKE 1 CAPSULE (10 MG TOTAL) BY MOUTH DAILY. 30 each 11   levothyroxine (SYNTHROID) 100 MCG tablet Take 1 tablet (100 mcg total) by mouth daily before breakfast. 60 tablet 1   losartan (COZAAR) 25 MG tablet Take 1 tablet (25 mg total) by mouth daily. 90 tablet 3   metoprolol tartrate (LOPRESSOR) 25 MG tablet TAKE 1 TABLET BY MOUTH TWICE DAILY 180 tablet 3   ondansetron (ZOFRAN) 8 MG tablet Take 1 tablet (8 mg total) by mouth every 8 (eight) hours as needed for nausea. 30 tablet 3   pantoprazole (PROTONIX) 40 MG tablet TAKE 1 TABLET BY MOUTH ONCE DAILY 90 tablet 3   simvastatin (ZOCOR) 20 MG tablet Take 1 tablet (20 mg total) by mouth daily in the evening. 90 tablet 4   sodium fluoride (PREVIDENT 5000 PLUS) 1.1 % CREA dental cream Use as directed 51 g 12   SYRINGE-NEEDLE, DISP, 3 ML 25G X 5/8" 3 ML MISC Use as directed to administer B12 shot every 30 days. 12 each 0   tacrolimus (PROTOPIC) 0.1 % ointment Apply to affected area on the skin twice a day 30 g 0   venlafaxine XR (EFFEXOR XR) 150 MG 24 hr capsule Take 2 capsules (300 mg total) by mouth every morning. Take with food. 180 capsule 3   No current facility-administered medications for this visit.    SUMMARY OF ONCOLOGIC HISTORY: Oncology History Overview Note  [facility] testing done 11-2015 on surgical path from 2014:    MS stable   TMB low  4 muts/mb   ATM [id number]   ERBB3 T389K   E2H2 rearrangement exon 9   PPP2R1A P179R   TP53 I195N    ER- APPROXIMATELY 25-35% STAINING IN NEOPLASTIC CELLS (INTERMEDIATE)  PR- APPROXIMATELY 25-35% STAINING IN NEOPLASTIC CELLS (STRONG)  Repeat biopsy 04/02/17: ER negative, Her 2 negative  Progressed on Doxil   Endometrial cancer (HCC)  06/20/2012 Pathology Results   Biopsy positive for papillary serous carcinoma   06/20/2012 Genetic  Testing   [employer] testing done 11-2015 on surgical path from 2014:    MS stable   TMB low  4 muts/mb   ATM [id number]   ERBB3 T389K   E2H2 rearrangement exon 9   PPP2R1A P179R   TP53 I195N    06/20/2012 Initial Diagnosis   Patient presented to PCP with intermittent vaginal bleeding since ~ Oct 2013, endometrial biopsy 06-20-12 with complex endometrial hyperplasia with atypia   06/26/2012 Imaging   Thickened endometrial lining in a postmenopausal patient experiencing vaginal bleeding. In the setting of post-menopausal bleeding, endometrial sampling is indicated to exclude carcinoma. No focal myometrial abnormalities are seen.  Normal left ovary and non-visualized right ovary   07/30/2012 Surgery   Dr. Clifton James performed robotic hysterectomy with bilateral salpingo-oophorectomy, bilateral pelvic lymph node dissection and periaortic lymph node dissection. Intraoperatively on frozen section, the patient was noted to have a large endometrial polyp with changes within the  polyp consistent for high-grade malignancy, possibly papillary serous carcinoma. There is no obvious extrauterine disease noted.     07/30/2012 Pathology Results   [phone] SUPPLEMENTAL REPORT  THE ENDOMETRIAL CARCINOMA WAS ANALYZED FOR DNA MISMATCH REPAIR PROTEINS.  IMMUNOHISTOCHEMICALLY, THE NEOPLASM RETAINED NUCLEAR EXPRESSION OF 4 GENE PRODUCTS, MLH1, MSH2, MSH6, AND PMS2, INVOLVED IN DNA MISMATCH REPAIR.  POSITIVE AND NEGATIVE CONTROLS WORKED APPROPRIATELY.  PER REQUEST, AN ER AND PR ARE PERFORMED ON BLOCK 1I.  ER- APPROXIMATELY 25-35% STAINING IN NEOPLASTIC CELLS (INTERMEDIATE)  PR- APPROXIMATELY 25-35% STAINING IN NEOPLASTIC CELLS (STRONG)  ER AND PR PREDOMINANTLY SHOW STAINING IN THE SEROUS COMPONENT.  DIAGNOSIS  1. UTERUS, CERVIX WITH BILATERAL FALLOPIAN TUBES AND OVARIES,  HYSTERECTOMY AND BILATERAL SALPINGO-OOPHORECTOMY:  HIGH GRADE/POORLY DIFFERENTIATED ENDOMETRIAL ADENOCARCINOMA WITH SOLID AND SEROUS  PAPILLARY COMPONENTS.  HISTOPATHOLOGIC TYPE:    A VARIETY OF PATTERNS WERE PRESENT, ALL OF WHICH SHOULD BE CONSIDERED TO BE HIGH GRADE.  SEROUS PAPILLARY CARCINOMA WAS SEEN OCCURRING ADJACENT TO SOLID ENDOMETRIAL CARCINOMA WITH MARKED ANAPLASIA AND GIANT CELLS. SIZE: TUMOR MEASURED AT LEAST 4.8 CM IN GREATEST HORIZONTAL DIMENSION.  GRADE: POORLY DIFFERENTIATED OR HIGH GRADE. DEPTH OF INVASION: NO DEFINITE MYOMETRIAL INVOLVEMENT WAS SEEN.  THE TUMOR APPEARED CONFINED TO THE POLYP AS WELL AS SURFACE ENDOMETRIUM. WHERE MYOMETRIAL THICKNESS NOT APPLICABLE. SEROSAL INVOLVEMENT:       NOT DEMONSTRATED.  ENDOCERVICAL INVOLVEMENT:  NOT DEMONSTRATED. RESECTION MARGINS:         FREE OF INVOLVEMENT. EXTRAUTERINE EXTENSION:    NOT DEMONSTRATED. ANGIOLYMPHATIC INVASION:   NOT DEFINITELY SEEN. TOTAL NODES EXAMINED:      22.    PELVIC NODES EXAMINED:  20.    PELVIC NODES INVOLVED:  0.    PARA-AORTIC NODES       2. EXAMINED:    PARA-AORTIC NODES       0. INVOLVED TNM STAGE:                 T1A N0 MX. AJCC STAGE GROUPING:       IA. FIGO STAGE:                IA.   08/26/2012 Imaging   No CT evidence for intra-abdominal or pelvic metastatic disease. Trace free pelvic fluid, presumably postoperative although the date of surgery is not documented in the electronic medical record.   08/26/2012 - 12/13/2012 Chemotherapy   She received 6 cycles of carbo/taxol    10/29/2012 - 11/27/2012 Radiation Therapy   May 27, June 5,  June 11, June 19, November 27, 2012: Proximal vagina 30 Gy in 5 fractions      01/17/2013 Imaging   1.  No evidence of recurrent or metastatic disease. 2.  No acute abnormality involving the abdomen or pelvis. 3.  Mild diffuse hepatic steatosis. 4.  Very small supraumbilical midline anterior abdominal wall hernia containing fat, unchanged   01/22/2014 Imaging   No evidence for metastatic or recurrent disease. 2. No bowel obstruction.  Normal appendix. 3. Small fat containing hernia, stable in  appearance. 4. Status post hysterectomy and bilateral oophorectomy   02/19/2014 Imaging   No pulmonary lesions are identified. The abnormality on the chest x-ray is due to asymmetric left-sided sternoclavicular joint degenerative disease   10/12/2014 Imaging   New mild retroperitoneal lymphadenopathy in the left paraaortic region and proximal left common iliac chain, consistent with metastatic disease. No other sites of metastatic disease identified within the abdomen or pelvis.       11/27/2014 - 02/11/2015 Chemotherapy  She received 4 cycles of carbo/taxol    03/01/2015 PET scan   Single hypermetabolic small retroperitoneal lymph node along the aorta. 2. No evidence of metastatic disease otherwise in the abdomen or pelvis. No evidence local recurrence. 3. Intensely hypermetabolic enlarged nodule adjacent to the RIGHT lobe of thyroid gland. This presumably represents the biopsied lesion in clinician report which was found to be benign thyroid tissue.   04/05/2015 - 05/13/2015 Radiation Therapy   She received 50.4 gray in 28 fractions with simultaneous integrated boost to 56 gray   06/17/2015 Imaging   No acute process or evidence of metastatic disease in the abdomen or pelvis. Resolution of previously described retroperitoneal adenopathy. 2.  Possible constipation. 3. Atherosclerosis.   06/17/2015 Tumor Marker   Patient's tumor was tested for the following markers: CA125 Results of the tumor marker test revealed 33   08/12/2015 Tumor Marker   Patient's tumor was tested for the following markers: CA125 Results of the tumor marker test revealed 52   08/31/2015 PET scan   Development of right paratracheal hypermetabolic adenopathy, consistent with nodal metastasis. 2. The previously described isolated abdominal retroperitoneal hypermetabolic node has resolved. 3. Persistent hypermetabolic right thyroid nodule, per report previously biopsied. Correlate with those results. 4.  Possible  constipation.   09/09/2015 -  Anti-estrogen oral therapy   She has been receiving alternative treatment between megace and tamoxifen   09/09/2015 Tumor Marker   Patient's tumor was tested for the following markers: CA125 Results of the tumor marker test revealed 37.8   09/20/2015 Procedure   Technically successful ultrasound-guided thyroid aspiration biopsy , dominant right nodule   09/20/2015 Pathology Results   THYROID, RIGHT, FINE NEEDLE ASPIRATION (SPECIMEN 1 OF 1 COLLECTED 09/20/15): FINDINGS CONSISTENT WITH BENIGN THYROID NODULE (BETHESDA CATEGORY II).   10/28/2015 Tumor Marker   Patient's tumor was tested for the following markers: CA125 Results of the tumor marker test revealed 53.2   11/04/2015 Imaging   Stable benign right thyroid nodule   12/16/2015 Tumor Marker   Patient's tumor was tested for the following markers: CA125 Results of the tumor marker test revealed 38.1   03/22/2016 PET scan   Interval progression of hypermetabolic right paratracheal lymph node consistent with metastatic involvement. 2. Stable hypermetabolic right thyroid nodule. Reportedly this has been biopsied in the past.   03/23/2016 Tumor Marker   Patient's tumor was tested for the following markers: CA125 Results of the tumor marker test revealed 62.4   04/13/2016 - 06/01/2016 Radiation Therapy   She received 56 Gy to the chest in 28 fractions    05/09/2016 Imaging   No evidence of left lower extremity deep vein thrombosis. No evidence of a superficial thrombosis of the greater and lesser saphenous veins. Positive for thrombus noted in several varicosities of the calf. No evidence of Baker's cyst on the left.   05/17/2016 Tumor Marker   Patient's tumor was tested for the following markers: CA125 Results of the tumor marker test revealed 9   06/29/2016 Tumor Marker   Patient's tumor was tested for the following markers: CA125 Results of the tumor marker test revealed 5.9   08/04/2016 Tumor Marker    Patient's tumor was tested for the following markers: CA125 Results of the tumor marker test revealed 6.0   09/12/2016 PET scan   Complete metabolic response to therapy, with resolution of hypermetabolic mediastinal lymphadenopathy since prior exam. No residual or new metastatic disease identified. Stable hypermetabolic right thyroid lobe nodule, which was previously biopsied on 09/20/2015.  03/13/2017 PET scan   1. Solitary focus of recurrent right paratracheal hypermetabolic activity, with a 1.0 cm right lower paratracheal node having a maximum SUV of 9.0. Appearance compatible with recurrent malignancy. 2. Continued hypermetabolic right thyroid nodule, previously biopsied, presumed benign -correlate with prior biopsy results. 3. Other imaging findings of potential clinical significance: Aortic Atherosclerosis (ICD10-I70.0). Mild cardiomegaly. Prominent stool throughout the colon favors constipation.   04/02/2017 Pathology Results   FINE NEEDLE ASPIRATION, ENDOSCOPIC, (EBUS) 4 R NODE (SPECIMEN 1 OF 2 COLLECTED 04/02/17): MALIGNANT CELLS PRESENT, CONSISTENT WITH CARCINOMA. SEE COMMENT. COMMENT: THE MALIGNANT CELLS ARE POSITIVE FOR P53 AND NEGATIVE FOR ESTROGEN RECEPTOR AND TTF-1. THIS PROFILE IS NON-SPECIFIC, BUT THE P53 POSITIVE STAINING IS SUGGESTIVE OF GYNECOLOGIC PRIMARY.    04/11/2017 Procedure   Successful 8 French right internal jugular vein power port placement with its tip at the SVC/RA junction   04/12/2017 Imaging   Normal LV size with mild LV hypertrophy. EF 55-60%. Normal RV size and systolic function. Aortic valve sclerosis without significant stenosis.   04/18/2017 - 06/14/2017 Chemotherapy   The patient had chemotherapy with Doxil    07/10/2017 Imaging   - Left ventricle: The cavity size was normal. There was mild concentric hypertrophy. Systolic function was normal. The estimated ejection fraction was in the range of 55% to 60%. Wall motion was normal; there were no regional  wall motion abnormalities. Doppler parameters are consistent with abnormal left ventricular relaxation (grade 1 diastolic dysfunction). - Aortic valve: Noncoronary cusp mobility was mildly restricted. - Mitral valve: There was mild regurgitation. - Left atrium: The atrium was mildly dilated. - Atrial septum: No defect or patent foramen ovale was identified.  Impressions:  - Compared to November 2018, global LV longitudinal strain remains normal (has increased)   07/11/2017 PET scan   Increased size and hypermetabolic activity of 3.3 cm right thyroid lobe nodule. Thyroid carcinoma cannot be excluded. Recommend repeat ultrasound guided fine needle aspiration to exclude thyroid carcinoma.  New adjacent hypermetabolic 10 mm right supraclavicular lymph node, suspicious for lymph node metastasis.  Slight increase in size and hypermetabolic activity of solitary right paratracheal lymph node.  No evidence of abdominal or pelvic metastatic disease.   07/18/2017 Procedure   1. Technically successful ultrasound guided fine needle aspiration of indeterminate hypermetabolic right-sided thyroid nodule/mass. 2. Technically successful ultrasound-guided core needle biopsy of hypermetabolic right lower cervical lymph node.   07/18/2017 Pathology Results   THYROID, FINE NEEDLE ASPIRATION RIGHT (SPECIMEN 1 OF 1, COLLECTED ON 07/18/2017): ATYPIA OF UNDETERMINED SIGNIFICANCE OR FOLLICULAR LESION OF UNDETERMINED SIGNIFICANCE (BETHESDA CATEGORY III). SEE COMMENT. COMMENT: THE SPECIMEN CONSISTS OF SMALL AND MEDIUM SIZED GROUPS OF FOLLICULAR EPITHELIAL CELLS WITH MILD CYTOLOGIC ATYPIA INCLUDING NUCLEAR ENLARGEMENT AND HURTHLE CELL CHANGE. SOME GROUPS ARE ARRANGED AS MICROFOLLICLES. THERE IS MINIMAL BACKGROUND COLLOID. BASED ON THESE FEATURES, A FOLLICULAR LESION/NEOPLASM CAN NOT BE ENTIRELY RULED OUT. A SPECIMEN WILL BE SENT FOR AFIRMA TESTING.   07/19/2017 Pathology Results   Lymph node, needle/core biopsy -  METASTATIC PAPILLARY SEROUS CARCINOMA. - SEE COMMENT. Microscopic Comment Dr. Valinda Hoar has reviewed the case and concurs with this interpretation   10/15/2017 PET scan   No new or progressive disease. No evidence of abdominal or pelvic metastatic disease.  Stable hypermetabolic right thyroid lobe nodule and adjacent right supraclavicular lymph node.  Decreased size and hypermetabolic activity of solitary right paratracheal lymph node.   01/23/2018 PET scan   1. Overall, no significant change from PET-CT of 3 months ago. There is  persistent hypermetabolic activity at the right thoracic inlet and within a right paratracheal mediastinal node. The nodes have not significantly changed in size, although the metabolic activity has minimally increased over this interval. It is uncertain how much of the thoracic inlet metabolic activity is attributed to the thyroid nodule versus adjacent cervical lymph nodes, both previously biopsied. 2. No new hypermetabolic activity within the neck, chest, abdomen or pelvis. No evidence of local recurrence in the pelvis. 3. Stable probable radiation changes in the right lung.   04/16/2018 PET scan   1. Interval increase in metabolic activity of the RIGHT supraclavicular node and RIGHT lower paratracheal mediastinal node with minimal change in size. Findings consistent with persistent and mildly progressed metastatic adenopathy. 2. New hypermetabolic small lymph node in the RIGHT lower paratracheal nodal station adjacent to the previously followed node. 3. No evidence of local recurrence in the pelvis or new disease elsewhere.   07/30/2018 PET scan   1. Mild interval progression of right supraclavicular, mediastinal, and right hilar hypermetabolic metastatic disease. There is a new hypermetabolic lymph node in the subcarinal station on today's study. 2. No hypermetabolic disease in the abdomen or pelvis to suggest recurrent/metastases. 3.  Aortic Atherosclerois  (ICD10-170.0)     08/16/2018 -  Chemotherapy   The patient had Lenvima since 3/6 and pembrolizumab since 3/13 for chemotherapy treatment. Assunta Curtis was held temporarily due to infection and severe hypertension   10/31/2018 PET scan   Partial response to therapy, with decreased hypermetabolic lymphadenopathy in mediastinum and right hilar region.   No significant change in hypermetabolic lymphadenopathy in right inferior neck.   No new or progressive metastatic disease identified. No evidence of recurrent or metastatic carcinoma in the pelvis or abdomen   02/04/2019 PET scan   1. Decrease in metabolic activity of RIGHT supraclavicular node and RIGHT paratracheal lymph nodes. 2. Persistent activity in the RIGHT thyroid nodule unchanged. 3. Diffuse activity through the esophagus is favored esophagitis. No change. 4. Scattered foci of intense metabolic activity associated the bowel without focal lesion on CT favored benign. 5. No evidence of peritoneal metastasis. 6. No evidence of metastatic adenopathy in the abdomen pelvis. 7. No evidence of local pelvic sidewall recurrence.   07/30/2019 PET scan   1. Mildly increased uptake within small nodes along the right sternocleidomastoid without change in size and associated with increased deltoid activity may reflect changes related to right arm injection or recent COVID vaccination, consider clinical correlation and attention on follow-up. 2. Persistent activity in the right thyroid nodule, unchanged from previous exams. 3. New activity in a juxta esophageal lymph node in the chest is suspicious though the node is unchanged with respect to size. Endoscopic correlation could potentially be helpful or close attention on follow-up. 4. Subtle increased FDG uptake along the left mainstem bronchus and adjacent to the aorta may represent a small lymph node. No discrete correlate is demonstrated on today's study.   10/22/2019 PET scan   1. Persistent FDG avid  subcentimeter right supraclavicular and juxta esophageal lymph nodes. The degree of FDG uptake is similar to the previous exam. No new or progressive findings identified. 2. No findings of solid organ metastasis, skeletal metastasis or metastatic disease to the abdomen or pelvis. 3. FDG avid right lobe of thyroid gland nodule is again noted This nodule is not incidental and been worked up previously with 2 biopsies. Please refer to results from the most recent biopsy dated 07/18/2017. (Ref: J Am Coll Radiol. 2015 Feb;12(2): 143-50). 4.  Aortic Atherosclerosis (ICD10-I70.0).   05/05/2020 PET scan   1. Progressive disease, as evidenced by increased hypermetabolism within right supraclavicular and mediastinal nodes. 2. No infradiaphragmatic hypermetabolic metastasis. 3.  Aortic Atherosclerosis (ICD10-I70.0). 4. Hypermetabolic right thyroid nodule has been detailed on prior exams and was biopsied on 07/18/2017   09/10/2020 PET scan   1. Interval improvement in previously demonstrated hypermetabolic mediastinal and right supraclavicular adenopathy. A single hypermetabolic left paraesophageal lymph node remains, improved from previous study. The other nodes have resolved.  2. No progressive disease. 3. Diffuse esophageal hypermetabolic activity suggesting esophagitis. 4. Hypermetabolic right thyroid nodule has been previously biopsied.   03/18/2021 PET scan   1. No significant change in single residual FDG avid left paraesophageal lymph node. This has an SUV max of 7.03, compared with 6.4 previously. No new sites of disease identified. 2. Diffuse esophageal hypermetabolic activity suggestive of esophagitis. 3. FDG avid right lobe of thyroid gland nodule. This has been biopsied previously. (Ref: J Am Coll Radiol. 2015 Feb;12(2): 143-50).     09/22/2021 PET scan   1. Persistent mild diffuse FDG uptake in the esophagus likely chronic inflammation. 2. Resolution of the left paraesophageal lymph node seen  on the prior study. 3. Stable hypermetabolic right thyroid nodule, previously biopsied. 4. No findings for recurrent abdominal/pelvic disease or metastatic disease.     02/23/2022 -  Chemotherapy   Patient is on Treatment Plan : UTERINE Lenvatinib (20) D1-21 + Pembrolizumab (200) D1 q21d     05/04/2022 PET scan   1. Status post hysterectomy without evidence of hypermetabolic local recurrence or hypermetabolic metastatic disease. 2. Persistent diffuse long segment hypermetabolic esophageal activity, likely reflects chronic esophagitis. Consider further evaluation with endoscopy if not previously performed. 3.  Aortic Atherosclerosis (ICD10-I70.0).     11/09/2022 PET scan   NM PET Image Restage (PS) Skull Base to Thigh (F-18 FDG)  Result Date: 11/09/2022 CLINICAL DATA:  Subsequent treatment strategy for endometrial carcinoma status post hysterectomy. EXAM: NUCLEAR MEDICINE PET SKULL BASE TO THIGH TECHNIQUE: 7.6 mCi F-18 FDG was injected intravenously. Full-ring PET imaging was performed from the skull base to thigh after the radiotracer. CT data was obtained and used for attenuation correction and anatomic localization. Fasting blood glucose: 93 mg/dl COMPARISON:  40/98/1191 FINDINGS: Mediastinal blood pool activity: SUV max 2.44 Liver activity: SUV max NA NECK: No hypermetabolic lymph nodes in the neck. Hypermetabolic right lobe of thyroid gland nodule is again noted with SUV max of 10.86, previously 11.9. Incidental CT findings: None. CHEST: No hypermetabolic mediastinal or hilar nodes. No suspicious pulmonary nodules on the CT scan. Mild diffuse increased radiotracer uptake within the esophagus is again noted with SUV max 5.28. Previously this measured the same. No discrete mass identified. Incidental CT findings: Aortic atherosclerosis. ABDOMEN/PELVIS: No abnormal hypermetabolic activity within the liver, pancreas, adrenal glands, or spleen. No hypermetabolic lymph nodes in the abdomen or pelvis.  Incidental CT findings: Status post hysterectomy. Aortic atherosclerotic calcifications. SKELETON: No focal hypermetabolic activity to suggest skeletal metastasis. Incidental CT findings: None. IMPRESSION: 1. No specific findings identified to suggest locally recurrent tumor or metastatic disease status post hysterectomy. 2. Persistent, mild, diffuse increased radiotracer uptake within the esophagus. These findings are favored to represent chronic esophagitis. 3. No new or progressive disease identified. 4. Persistent hypermetabolic right lobe of thyroid gland nodule. This has been evaluated on previous imaging and biopsy. (Ref: J Am Coll Radiol. 2015 Feb;12(2): 143-50). 5. Aortic Atherosclerosis (ICD10-I70.0). Electronically Signed   By: Veronda Prude.D.  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 Lenvima was held temporarily due to infection and severe hypertension     PHYSICAL EXAMINATION: ECOG PERFORMANCE STATUS: 1 - Symptomatic but completely ambulatory  Vitals:   11/09/22 1353  BP: 119/75  Pulse: 70  Resp: 18  SpO2: 100%   Filed Weights   11/09/22 1353  Weight: 152 lb (68.9 kg)    GENERAL:alert, no distress and comfortable NEURO: alert & oriented x 3 with fluent speech, no focal motor/sensory deficits  LABORATORY DATA:  I have reviewed the data as listed    Component Value Date/Time   NA 137 11/09/2022 1111   NA 141 05/17/2017 0957   K 4.2 11/09/2022 1111   K 3.6 05/17/2017 0957   CL 101 11/09/2022 1111   CL 105 11/19/2012 0848   CO2 30 11/09/2022 1111   CO2 25 05/17/2017 0957   GLUCOSE 107 (H) 11/09/2022 1111   GLUCOSE 85 05/17/2017 0957   GLUCOSE 122 (H) 11/19/2012 0848   BUN 19 11/09/2022 1111   BUN 15.2 05/17/2017 0957   CREATININE  0.87 11/09/2022 1111   CREATININE 0.8 05/17/2017 0957   CALCIUM 9.9 11/09/2022 1111   CALCIUM 9.6 05/17/2017 0957   PROT 7.4 11/09/2022 1111   PROT 7.0 05/17/2017 0957   ALBUMIN 4.3 11/09/2022 1111   ALBUMIN 3.9 05/17/2017 0957   AST 24 11/09/2022 1111   AST 25 05/17/2017 0957   ALT 18 11/09/2022 1111   ALT 18 05/17/2017 0957   ALKPHOS 49 11/09/2022 1111   ALKPHOS 57 05/17/2017 0957   BILITOT 0.5 11/09/2022 1111   BILITOT 0.47 05/17/2017 0957   GFRNONAA >60 11/09/2022 1111   GFRAA >60 02/26/2020 0940   GFRAA >60 07/10/2019 1240    No results found for: "SPEP", "UPEP"  Lab Results  Component Value Date   WBC 4.1 11/09/2022   NEUTROABS 2.6 11/09/2022   HGB 11.3 (L) 11/09/2022   HCT 34.0 (L) 11/09/2022   MCV 95.5 11/09/2022   PLT 179 11/09/2022      Chemistry      Component Value Date/Time   NA 137 11/09/2022 1111   NA 141 05/17/2017 0957   K 4.2 11/09/2022 1111   K 3.6 05/17/2017 0957   CL 101 11/09/2022 1111   CL 105 11/19/2012 0848   CO2 30 11/09/2022 1111   CO2 25 05/17/2017 0957   BUN 19 11/09/2022 1111   BUN 15.2 05/17/2017 0957   CREATININE 0.87 11/09/2022 1111   CREATININE 0.8 05/17/2017 0957   GLU 158 (H) 01/14/2015 1035      Component Value Date/Time   CALCIUM 9.9 11/09/2022 1111   CALCIUM 9.6 05/17/2017 0957   ALKPHOS 49 11/09/2022 1111   ALKPHOS 57 05/17/2017 0957   AST 24 11/09/2022 1111   AST 25 05/17/2017 0957   ALT 18 11/09/2022 1111   ALT 18 05/17/2017 0957   BILITOT 0.5 11/09/2022 1111   BILITOT 0.47 05/17/2017 0957       RADIOGRAPHIC STUDIES: I reviewed imaging with patient I have personally reviewed the radiological images as listed and agreed with the findings in the report. NM PET Image Restage (PS) Skull Base to Thigh (F-18 FDG)  Result Date: 11/09/2022 CLINICAL DATA:  Subsequent treatment strategy for endometrial carcinoma  status post hysterectomy. EXAM: NUCLEAR MEDICINE PET SKULL BASE TO THIGH TECHNIQUE: 7.6 mCi F-18 FDG was  injected intravenously. Full-ring PET imaging was performed from the skull base to thigh after the radiotracer. CT data was obtained and used for attenuation correction and anatomic localization. Fasting blood glucose: 93 mg/dl COMPARISON:  16/03/9603 FINDINGS: Mediastinal blood pool activity: SUV max 2.44 Liver activity: SUV max NA NECK: No hypermetabolic lymph nodes in the neck. Hypermetabolic right lobe of thyroid gland nodule is again noted with SUV max of 10.86, previously 11.9. Incidental CT findings: None. CHEST: No hypermetabolic mediastinal or hilar nodes. No suspicious pulmonary nodules on the CT scan. Mild diffuse increased radiotracer uptake within the esophagus is again noted with SUV max 5.28. Previously this measured the same. No discrete mass identified. Incidental CT findings: Aortic atherosclerosis. ABDOMEN/PELVIS: No abnormal hypermetabolic activity within the liver, pancreas, adrenal glands, or spleen. No hypermetabolic lymph nodes in the abdomen or pelvis. Incidental CT findings: Status post hysterectomy. Aortic atherosclerotic calcifications. SKELETON: No focal hypermetabolic activity to suggest skeletal metastasis. Incidental CT findings: None. IMPRESSION: 1. No specific findings identified to suggest locally recurrent tumor or metastatic disease status post hysterectomy. 2. Persistent, mild, diffuse increased radiotracer uptake within the esophagus. These findings are favored to represent chronic esophagitis. 3. No new or progressive disease identified. 4. Persistent hypermetabolic right lobe of thyroid gland nodule. This has been evaluated on previous imaging and biopsy. (Ref: J Am Coll Radiol. 2015 Feb;12(2): 143-50). 5. Aortic Atherosclerosis (ICD10-I70.0). Electronically Signed   By: Signa Kell M.D.   On: 11/09/2022 09:23

## 2022-11-09 NOTE — Assessment & Plan Note (Signed)
Could be due to sweating  I recommend keeping his skin dry I sent prescription for topical antifungal powder

## 2022-11-09 NOTE — Assessment & Plan Note (Signed)
Her PET CT scan is normal Previously noted left paraesophageal lymphadenopathy that was previously treated with radiation therapy has normalized since April of 2023 I recommend we continue surveillance imaging study every 6 months and if she has no signs of disease progression in April 2025, we can consider discontinuation of systemic treatment She is in agreement with the plan of care So far, she tolerated treatment well without any side effects Due to slight abnormalities noted in her esophagus, recommend GI referral and she agreed

## 2022-11-09 NOTE — Assessment & Plan Note (Signed)
She has history of GERD and I suspect that could be the cause of abnormal PET/CT imaging but I recommend GI referral to evaluate and she agreed

## 2022-11-10 ENCOUNTER — Other Ambulatory Visit (HOSPITAL_COMMUNITY): Payer: Self-pay

## 2022-11-10 ENCOUNTER — Other Ambulatory Visit: Payer: Self-pay

## 2022-11-17 ENCOUNTER — Other Ambulatory Visit (HOSPITAL_COMMUNITY): Payer: Self-pay

## 2022-11-21 ENCOUNTER — Other Ambulatory Visit (HOSPITAL_COMMUNITY): Payer: Self-pay

## 2022-11-22 ENCOUNTER — Other Ambulatory Visit: Payer: Self-pay

## 2022-11-22 ENCOUNTER — Other Ambulatory Visit (HOSPITAL_COMMUNITY): Payer: Self-pay

## 2022-11-22 MED ORDER — TACROLIMUS 0.1 % EX OINT
TOPICAL_OINTMENT | CUTANEOUS | 10 refills | Status: AC
  Filled 2022-11-22: qty 100, 30d supply, fill #0
  Filled 2022-11-29: qty 30, 30d supply, fill #0
  Filled 2023-06-11 – 2023-07-02 (×2): qty 30, 30d supply, fill #1
  Filled 2023-10-26: qty 30, 30d supply, fill #2

## 2022-11-29 ENCOUNTER — Other Ambulatory Visit (HOSPITAL_BASED_OUTPATIENT_CLINIC_OR_DEPARTMENT_OTHER): Payer: Self-pay

## 2022-11-29 ENCOUNTER — Other Ambulatory Visit: Payer: Self-pay

## 2022-12-04 ENCOUNTER — Other Ambulatory Visit: Payer: Self-pay | Admitting: Hematology and Oncology

## 2022-12-04 MED ORDER — LEVOTHYROXINE SODIUM 100 MCG PO TABS
100.0000 ug | ORAL_TABLET | Freq: Every day | ORAL | 1 refills | Status: DC
  Filled 2022-12-04: qty 60, 60d supply, fill #0
  Filled 2023-02-07: qty 60, 60d supply, fill #1

## 2022-12-05 ENCOUNTER — Other Ambulatory Visit: Payer: Self-pay

## 2022-12-15 ENCOUNTER — Other Ambulatory Visit (HOSPITAL_COMMUNITY): Payer: Self-pay

## 2022-12-18 ENCOUNTER — Other Ambulatory Visit: Payer: Self-pay

## 2022-12-20 ENCOUNTER — Other Ambulatory Visit (HOSPITAL_COMMUNITY): Payer: Self-pay

## 2022-12-21 ENCOUNTER — Other Ambulatory Visit (HOSPITAL_COMMUNITY): Payer: Self-pay

## 2022-12-21 ENCOUNTER — Inpatient Hospital Stay: Payer: 59

## 2022-12-21 ENCOUNTER — Other Ambulatory Visit: Payer: Self-pay

## 2022-12-21 ENCOUNTER — Inpatient Hospital Stay: Payer: 59 | Attending: Hematology and Oncology

## 2022-12-21 ENCOUNTER — Telehealth: Payer: Self-pay

## 2022-12-21 ENCOUNTER — Encounter: Payer: Self-pay | Admitting: Hematology and Oncology

## 2022-12-21 ENCOUNTER — Inpatient Hospital Stay (HOSPITAL_BASED_OUTPATIENT_CLINIC_OR_DEPARTMENT_OTHER): Payer: 59 | Admitting: Hematology and Oncology

## 2022-12-21 VITALS — BP 142/71 | HR 65 | Resp 18 | Ht 64.0 in | Wt 153.0 lb

## 2022-12-21 VITALS — BP 116/71 | HR 60 | Temp 98.4°F | Resp 18

## 2022-12-21 DIAGNOSIS — C541 Malignant neoplasm of endometrium: Secondary | ICD-10-CM | POA: Diagnosis not present

## 2022-12-21 DIAGNOSIS — D61818 Other pancytopenia: Secondary | ICD-10-CM | POA: Insufficient documentation

## 2022-12-21 DIAGNOSIS — Z95828 Presence of other vascular implants and grafts: Secondary | ICD-10-CM

## 2022-12-21 DIAGNOSIS — E039 Hypothyroidism, unspecified: Secondary | ICD-10-CM

## 2022-12-21 DIAGNOSIS — K089 Disorder of teeth and supporting structures, unspecified: Secondary | ICD-10-CM | POA: Diagnosis not present

## 2022-12-21 DIAGNOSIS — C770 Secondary and unspecified malignant neoplasm of lymph nodes of head, face and neck: Secondary | ICD-10-CM | POA: Insufficient documentation

## 2022-12-21 DIAGNOSIS — Z79620 Long term (current) use of immunosuppressive biologic: Secondary | ICD-10-CM | POA: Diagnosis not present

## 2022-12-21 DIAGNOSIS — E538 Deficiency of other specified B group vitamins: Secondary | ICD-10-CM | POA: Diagnosis not present

## 2022-12-21 DIAGNOSIS — Z5112 Encounter for antineoplastic immunotherapy: Secondary | ICD-10-CM | POA: Insufficient documentation

## 2022-12-21 DIAGNOSIS — I10 Essential (primary) hypertension: Secondary | ICD-10-CM

## 2022-12-21 LAB — CBC WITH DIFFERENTIAL (CANCER CENTER ONLY)
Abs Immature Granulocytes: 0.01 10*3/uL (ref 0.00–0.07)
Basophils Absolute: 0.1 10*3/uL (ref 0.0–0.1)
Basophils Relative: 2 %
Eosinophils Absolute: 0.3 10*3/uL (ref 0.0–0.5)
Eosinophils Relative: 8 %
HCT: 32.1 % — ABNORMAL LOW (ref 36.0–46.0)
Hemoglobin: 10.9 g/dL — ABNORMAL LOW (ref 12.0–15.0)
Immature Granulocytes: 0 %
Lymphocytes Relative: 23 %
Lymphs Abs: 0.8 10*3/uL (ref 0.7–4.0)
MCH: 32.2 pg (ref 26.0–34.0)
MCHC: 34 g/dL (ref 30.0–36.0)
MCV: 94.7 fL (ref 80.0–100.0)
Monocytes Absolute: 0.3 10*3/uL (ref 0.1–1.0)
Monocytes Relative: 9 %
Neutro Abs: 2.2 10*3/uL (ref 1.7–7.7)
Neutrophils Relative %: 58 %
Platelet Count: 194 10*3/uL (ref 150–400)
RBC: 3.39 MIL/uL — ABNORMAL LOW (ref 3.87–5.11)
RDW: 13.5 % (ref 11.5–15.5)
WBC Count: 3.7 10*3/uL — ABNORMAL LOW (ref 4.0–10.5)
nRBC: 0 % (ref 0.0–0.2)

## 2022-12-21 LAB — CMP (CANCER CENTER ONLY)
ALT: 16 U/L (ref 0–44)
AST: 22 U/L (ref 15–41)
Albumin: 4.1 g/dL (ref 3.5–5.0)
Alkaline Phosphatase: 49 U/L (ref 38–126)
Anion gap: 7 (ref 5–15)
BUN: 25 mg/dL — ABNORMAL HIGH (ref 8–23)
CO2: 28 mmol/L (ref 22–32)
Calcium: 9.9 mg/dL (ref 8.9–10.3)
Chloride: 102 mmol/L (ref 98–111)
Creatinine: 0.9 mg/dL (ref 0.44–1.00)
GFR, Estimated: >60 mL/min (ref >60)
Glucose, Bld: 80 mg/dL (ref 70–99)
Potassium: 4.1 mmol/L (ref 3.5–5.1)
Sodium: 137 mmol/L (ref 135–145)
Total Bilirubin: 0.6 mg/dL (ref 0.3–1.2)
Total Protein: 7.3 g/dL (ref 6.5–8.1)

## 2022-12-21 LAB — TOTAL PROTEIN, URINE DIPSTICK: Protein, ur: NEGATIVE mg/dL

## 2022-12-21 LAB — TSH: TSH: 2.994 u[IU]/mL (ref 0.350–4.500)

## 2022-12-21 MED ORDER — SODIUM CHLORIDE 0.9 % IV SOLN
400.0000 mg | Freq: Once | INTRAVENOUS | Status: AC
  Administered 2022-12-21: 400 mg via INTRAVENOUS
  Filled 2022-12-21: qty 16

## 2022-12-21 MED ORDER — PANTOPRAZOLE SODIUM 40 MG PO TBEC
40.0000 mg | DELAYED_RELEASE_TABLET | Freq: Every day | ORAL | 3 refills | Status: AC
  Filled 2022-12-21: qty 90, 90d supply, fill #0

## 2022-12-21 MED ORDER — SODIUM CHLORIDE 0.9 % IV SOLN: Freq: Once | INTRAVENOUS | Status: AC

## 2022-12-21 MED ORDER — SODIUM CHLORIDE 0.9% FLUSH
10.0000 mL | Freq: Once | INTRAVENOUS | Status: AC
  Administered 2022-12-21: 10 mL

## 2022-12-21 MED ORDER — SODIUM CHLORIDE 0.9% FLUSH: 10.0000 mL | INTRAVENOUS | Status: DC | PRN

## 2022-12-21 MED ORDER — HEPARIN SOD (PORK) LOCK FLUSH 100 UNIT/ML IV SOLN: 500.0000 [IU] | Freq: Once | INTRAVENOUS | Status: DC | PRN

## 2022-12-21 NOTE — Assessment & Plan Note (Signed)
Her pancytopenia is stable

## 2022-12-21 NOTE — Progress Notes (Signed)
[facility] OFFICE PROGRESS NOTE  Patient Care Team: Sigmund Hazel, MD as PCP - General (Family Medicine) Adrian Prince, MD as Consulting Physician (Endocrinology)  ASSESSMENT & PLAN:  Endometrial cancer [facility]) Her last PET CT scan is normal Previously noted left paraesophageal lymphadenopathy that was previously treated with radiation therapy has normalized since April of 2023 I recommend we continue surveillance imaging study every 6 months and if she has no signs of disease progression in April 2025, we can consider discontinuation of systemic treatment So far, she tolerated treatment well without any side effects Due to slight abnormalities noted in her esophagus, recommend GI referral; for some reason, despite referral a month ago, she has not received a phone call We will call the GI service again  Pancytopenia, acquired [facility]) Her pancytopenia is stable  Acquired hypothyroidism She has intermittent elevated TSH I will adjust her thyroid medicine accordingly  Poor dentition The patient desire dental work She might need orthodontic services to her gum I recommend the patient to hold Lenvima for minimum 1 month before her surgery The patient will contact me as soon as she knows the date of her procedure  Vitamin B12 deficiency She will continue monthly vitamin B12 injection Last B12 level in January was adequate  No orders of the defined types were placed in this encounter.   All questions were answered. The patient knows to call the clinic with any problems, questions or concerns. The total time spent in the appointment was 30 minutes encounter with patients including review of chart and various tests results, discussions about plan of care and coordination of care plan   Artis Delay, MD 12/21/2022 1:31 PM  INTERVAL HISTORY: Please see below for problem oriented charting. she returns for treatment follow-up on combination of Lenvima and pembrolizumab She  complained of slight worsening fatigue when we changed her dose to every 6 weeks Thyroid function level is pending She has some dental problems and might need some orthodontic work on her gums Unfortunately, she has not heard back from GI service despite referral made a month ago She has some mild pancytopenia but overall stable.  She continues to give herself B12 injection monthly  REVIEW OF SYSTEMS:   Constitutional: Denies fevers, chills or abnormal weight loss Eyes: Denies blurriness of vision Ears, nose, mouth, throat, and face: Denies mucositis or sore throat Respiratory: Denies cough, dyspnea or wheezes Cardiovascular: Denies palpitation, chest discomfort or lower extremity swelling Gastrointestinal:  Denies nausea, heartburn or change in bowel habits Skin: Denies abnormal skin rashes Lymphatics: Denies new lymphadenopathy or easy bruising Neurological:Denies numbness, tingling or new weaknesses Behavioral/Psych: Mood is stable, no new changes  All other systems were reviewed with the patient and are negative.  I have reviewed the past medical history, past surgical history, social history and family history with the patient and they are unchanged from previous note.  ALLERGIES:  is allergic to lenvima [lenvatinib].  MEDICATIONS:  Current Outpatient Medications  Medication Sig Dispense Refill   amLODipine (NORVASC) 10 MG tablet TAKE 1 TABLET BY MOUTH ONCE DAILY. **DECREASE SIMVASTATIN TO 20 MG DAILY WHILE ON AMLODIPINE PER MD** 90 tablet 1   busPIRone (BUSPAR) 10 MG tablet Take 1 tablet by mouth twice daily. 180 tablet 1   Cholecalciferol (VITAMIN D3) 2000 units TABS Take 2,000 Units by mouth daily.      cyanocobalamin (DODEX) 1000 MCG/ML injection Inject 1 mL into the muscle every 30 days. 1 mL 11   hydrochlorothiazide (MICROZIDE) 12.5 MG  capsule Take 1 capsule (12.5 mg total) by mouth daily. 90 capsule 3   lenvatinib 10 mg daily dose (LENVIMA) capsule TAKE 1 CAPSULE (10 MG  TOTAL) BY MOUTH DAILY. 30 each 11   levothyroxine (SYNTHROID) 100 MCG tablet Take 1 tablet (100 mcg total) by mouth daily before breakfast. 60 tablet 1   losartan (COZAAR) 25 MG tablet Take 1 tablet (25 mg total) by mouth daily. 90 tablet 3   metoprolol tartrate (LOPRESSOR) 25 MG tablet TAKE 1 TABLET BY MOUTH TWICE DAILY 180 tablet 3   miconazole (MICOTIN) 2 % powder Apply topically 2 (two) times daily as needed for itching. 85 g 1   ondansetron (ZOFRAN) 8 MG tablet Take 1 tablet (8 mg total) by mouth every 8 (eight) hours as needed for nausea. 30 tablet 3   pantoprazole (PROTONIX) 40 MG tablet Take 1 tablet (40 mg total) by mouth daily. 90 tablet 3   simvastatin (ZOCOR) 20 MG tablet Take 1 tablet (20 mg total) by mouth daily in the evening. 90 tablet 4   sodium fluoride (PREVIDENT 5000 PLUS) 1.1 % CREA dental cream Use as directed 51 g 12   SYRINGE-NEEDLE, DISP, 3 ML 25G X 5/8" 3 ML MISC Use as directed to administer B12 shot every 30 days. 12 each 0   tacrolimus (PROTOPIC) 0.1 % ointment Apply to affected area on the skin twice a day 30 g 10   venlafaxine XR (EFFEXOR XR) 150 MG 24 hr capsule Take 2 capsules (300 mg total) by mouth every morning. Take with food. 180 capsule 3   No current facility-administered medications for this visit.   Facility-Administered Medications Ordered in Other Visits  Medication Dose Route Frequency Provider Last Rate Last Admin   heparin lock flush 100 unit/mL  500 Units Intracatheter Once PRN Bertis Ruddy, Faustina Gebert, MD       sodium chloride flush (NS) 0.9 % injection 10 mL  10 mL Intracatheter PRN Bertis Ruddy, Thomos Domine, MD        SUMMARY OF ONCOLOGIC HISTORY: Oncology History Overview Note  [employer] [facility] testing done 11-2015 on surgical path from 2014:    MS stable   TMB low  4 muts/mb   ATM [id number]   ERBB3 T389K   E2H2 rearrangement exon 9   PPP2R1A P179R   TP53 I195N    ER- APPROXIMATELY 25-35% STAINING IN NEOPLASTIC CELLS (INTERMEDIATE)  PR- APPROXIMATELY 25-35%  STAINING IN NEOPLASTIC CELLS (STRONG)  Repeat biopsy 04/02/17: ER negative, Her 2 negative  Progressed on Doxil   Endometrial cancer (HCC)  06/20/2012 Pathology Results   Biopsy positive for papillary serous carcinoma   06/20/2012 Genetic Testing   Foundation One testing done 11-2015 on surgical path from 2014:    MS stable   TMB low  4 muts/mb   ATM [id number]   ERBB3 T389K   E2H2 rearrangement exon 9   PPP2R1A P179R   TP53 I195N    06/20/2012 Initial Diagnosis   Patient presented to PCP with intermittent vaginal bleeding since ~ Oct 2013, endometrial biopsy 06-20-12 with complex endometrial hyperplasia with atypia   06/26/2012 Imaging   Thickened endometrial lining in a postmenopausal patient experiencing vaginal bleeding. In the setting of post-menopausal bleeding, endometrial sampling is indicated to exclude carcinoma. No focal myometrial abnormalities are seen.  Normal left ovary and non-visualized right ovary   07/30/2012 Surgery   Dr. Clifton James performed robotic hysterectomy with bilateral salpingo-oophorectomy, bilateral pelvic lymph node dissection and periaortic lymph node dissection. Intraoperatively on frozen section, the patient  was noted to have a large endometrial polyp with changes within the polyp consistent for high-grade malignancy, possibly papillary serous carcinoma. There is no obvious extrauterine disease noted.     07/30/2012 Pathology Results   [phone] SUPPLEMENTAL REPORT  THE ENDOMETRIAL CARCINOMA WAS ANALYZED FOR DNA MISMATCH REPAIR PROTEINS.  IMMUNOHISTOCHEMICALLY, THE NEOPLASM RETAINED NUCLEAR EXPRESSION OF 4 GENE PRODUCTS, MLH1, MSH2, MSH6, AND PMS2, INVOLVED IN DNA MISMATCH REPAIR.  POSITIVE AND NEGATIVE CONTROLS WORKED APPROPRIATELY.  PER REQUEST, AN ER AND PR ARE PERFORMED ON BLOCK 1I.  ER- APPROXIMATELY 25-35% STAINING IN NEOPLASTIC CELLS (INTERMEDIATE)  PR- APPROXIMATELY 25-35% STAINING IN NEOPLASTIC CELLS (STRONG)  ER AND PR PREDOMINANTLY SHOW  STAINING IN THE SEROUS COMPONENT.  DIAGNOSIS  1. UTERUS, CERVIX WITH BILATERAL FALLOPIAN TUBES AND OVARIES,  HYSTERECTOMY AND BILATERAL SALPINGO-OOPHORECTOMY:  HIGH GRADE/POORLY DIFFERENTIATED ENDOMETRIAL ADENOCARCINOMA WITH SOLID AND SEROUS PAPILLARY COMPONENTS.  HISTOPATHOLOGIC TYPE:    A VARIETY OF PATTERNS WERE PRESENT, ALL OF WHICH SHOULD BE CONSIDERED TO BE HIGH GRADE.  SEROUS PAPILLARY CARCINOMA WAS SEEN OCCURRING ADJACENT TO SOLID ENDOMETRIAL CARCINOMA WITH MARKED ANAPLASIA AND GIANT CELLS. SIZE: TUMOR MEASURED AT LEAST 4.8 CM IN GREATEST HORIZONTAL DIMENSION.  GRADE: POORLY DIFFERENTIATED OR HIGH GRADE. DEPTH OF INVASION: NO DEFINITE MYOMETRIAL INVOLVEMENT WAS SEEN.  THE TUMOR APPEARED CONFINED TO THE POLYP AS WELL AS SURFACE ENDOMETRIUM. WHERE MYOMETRIAL THICKNESS NOT APPLICABLE. SEROSAL INVOLVEMENT:       NOT DEMONSTRATED.  ENDOCERVICAL INVOLVEMENT:  NOT DEMONSTRATED. RESECTION MARGINS:         FREE OF INVOLVEMENT. EXTRAUTERINE EXTENSION:    NOT DEMONSTRATED. ANGIOLYMPHATIC INVASION:   NOT DEFINITELY SEEN. TOTAL NODES EXAMINED:      22.    PELVIC NODES EXAMINED:  20.    PELVIC NODES INVOLVED:  0.    PARA-AORTIC NODES       2. EXAMINED:    PARA-AORTIC NODES       0. INVOLVED TNM STAGE:                 T1A N0 MX. AJCC STAGE GROUPING:       IA. FIGO STAGE:                IA.   08/26/2012 Imaging   No CT evidence for intra-abdominal or pelvic metastatic disease. Trace free pelvic fluid, presumably postoperative although the date of surgery is not documented in the electronic medical record.   08/26/2012 - 12/13/2012 Chemotherapy   She received 6 cycles of carbo/taxol    10/29/2012 - 11/27/2012 Radiation Therapy   May 27, June 5,  June 11, June 19, November 27, 2012: Proximal vagina 30 Gy in 5 fractions      01/17/2013 Imaging   1.  No evidence of recurrent or metastatic disease. 2.  No acute abnormality involving the abdomen or pelvis. 3.  Mild diffuse hepatic steatosis. 4.  Very  small supraumbilical midline anterior abdominal wall hernia containing fat, unchanged   01/22/2014 Imaging   No evidence for metastatic or recurrent disease. 2. No bowel obstruction.  Normal appendix. 3. Small fat containing hernia, stable in appearance. 4. Status post hysterectomy and bilateral oophorectomy   02/19/2014 Imaging   No pulmonary lesions are identified. The abnormality on the chest x-ray is due to asymmetric left-sided sternoclavicular joint degenerative disease   10/12/2014 Imaging   New mild retroperitoneal lymphadenopathy in the left paraaortic region and proximal left common iliac chain, consistent with metastatic disease. No other sites of metastatic disease identified within the abdomen or  pelvis.       11/27/2014 - 02/11/2015 Chemotherapy   She received 4 cycles of carbo/taxol    03/01/2015 PET scan   Single hypermetabolic small retroperitoneal lymph node along the aorta. 2. No evidence of metastatic disease otherwise in the abdomen or pelvis. No evidence local recurrence. 3. Intensely hypermetabolic enlarged nodule adjacent to the RIGHT lobe of thyroid gland. This presumably represents the biopsied lesion in clinician report which was found to be benign thyroid tissue.   04/05/2015 - 05/13/2015 Radiation Therapy   She received 50.4 gray in 28 fractions with simultaneous integrated boost to 56 gray   06/17/2015 Imaging   No acute process or evidence of metastatic disease in the abdomen or pelvis. Resolution of previously described retroperitoneal adenopathy. 2.  Possible constipation. 3. Atherosclerosis.   06/17/2015 Tumor Marker   Patient's tumor was tested for the following markers: CA125 Results of the tumor marker test revealed 33   08/12/2015 Tumor Marker   Patient's tumor was tested for the following markers: CA125 Results of the tumor marker test revealed 52   08/31/2015 PET scan   Development of right paratracheal hypermetabolic adenopathy, consistent with nodal  metastasis. 2. The previously described isolated abdominal retroperitoneal hypermetabolic node has resolved. 3. Persistent hypermetabolic right thyroid nodule, per report previously biopsied. Correlate with those results. 4.  Possible constipation.   09/09/2015 -  Anti-estrogen oral therapy   She has been receiving alternative treatment between megace and tamoxifen   09/09/2015 Tumor Marker   Patient's tumor was tested for the following markers: CA125 Results of the tumor marker test revealed 37.8   09/20/2015 Procedure   Technically successful ultrasound-guided thyroid aspiration biopsy , dominant right nodule   09/20/2015 Pathology Results   THYROID, RIGHT, FINE NEEDLE ASPIRATION (SPECIMEN 1 OF 1 COLLECTED 09/20/15): FINDINGS CONSISTENT WITH BENIGN THYROID NODULE (BETHESDA CATEGORY II).   10/28/2015 Tumor Marker   Patient's tumor was tested for the following markers: CA125 Results of the tumor marker test revealed 53.2   11/04/2015 Imaging   Stable benign right thyroid nodule   12/16/2015 Tumor Marker   Patient's tumor was tested for the following markers: CA125 Results of the tumor marker test revealed 38.1   03/22/2016 PET scan   Interval progression of hypermetabolic right paratracheal lymph node consistent with metastatic involvement. 2. Stable hypermetabolic right thyroid nodule. Reportedly this has been biopsied in the past.   03/23/2016 Tumor Marker   Patient's tumor was tested for the following markers: CA125 Results of the tumor marker test revealed 62.4   04/13/2016 - 06/01/2016 Radiation Therapy   She received 56 Gy to the chest in 28 fractions    05/09/2016 Imaging   No evidence of left lower extremity deep vein thrombosis. No evidence of a superficial thrombosis of the greater and lesser saphenous veins. Positive for thrombus noted in several varicosities of the calf. No evidence of Baker's cyst on the left.   05/17/2016 Tumor Marker   Patient's tumor was tested for the  following markers: CA125 Results of the tumor marker test revealed 9   06/29/2016 Tumor Marker   Patient's tumor was tested for the following markers: CA125 Results of the tumor marker test revealed 5.9   08/04/2016 Tumor Marker   Patient's tumor was tested for the following markers: CA125 Results of the tumor marker test revealed 6.0   09/12/2016 PET scan   Complete metabolic response to therapy, with resolution of hypermetabolic mediastinal lymphadenopathy since prior exam. No residual or new metastatic disease  identified. Stable hypermetabolic right thyroid lobe nodule, which was previously biopsied on 09/20/2015.   03/13/2017 PET scan   1. Solitary focus of recurrent right paratracheal hypermetabolic activity, with a 1.0 cm right lower paratracheal node having a maximum SUV of 9.0. Appearance compatible with recurrent malignancy. 2. Continued hypermetabolic right thyroid nodule, previously biopsied, presumed benign -correlate with prior biopsy results. 3. Other imaging findings of potential clinical significance: Aortic Atherosclerosis (ICD10-I70.0). Mild cardiomegaly. Prominent stool throughout the colon favors constipation.   04/02/2017 Pathology Results   FINE NEEDLE ASPIRATION, ENDOSCOPIC, (EBUS) 4 R NODE (SPECIMEN 1 OF 2 COLLECTED 04/02/17): MALIGNANT CELLS PRESENT, CONSISTENT WITH CARCINOMA. SEE COMMENT. COMMENT: THE MALIGNANT CELLS ARE POSITIVE FOR P53 AND NEGATIVE FOR ESTROGEN RECEPTOR AND TTF-1. THIS PROFILE IS NON-SPECIFIC, BUT THE P53 POSITIVE STAINING IS SUGGESTIVE OF GYNECOLOGIC PRIMARY.    04/11/2017 Procedure   Successful 8 French right internal jugular vein power port placement with its tip at the SVC/RA junction   04/12/2017 Imaging   Normal LV size with mild LV hypertrophy. EF 55-60%. Normal RV size and systolic function. Aortic valve sclerosis without significant stenosis.   04/18/2017 - 06/14/2017 Chemotherapy   The patient had chemotherapy with Doxil    07/10/2017  Imaging   - Left ventricle: The cavity size was normal. There was mild concentric hypertrophy. Systolic function was normal. The estimated ejection fraction was in the range of 55% to 60%. Wall motion was normal; there were no regional wall motion abnormalities. Doppler parameters are consistent with abnormal left ventricular relaxation (grade 1 diastolic dysfunction). - Aortic valve: Noncoronary cusp mobility was mildly restricted. - Mitral valve: There was mild regurgitation. - Left atrium: The atrium was mildly dilated. - Atrial septum: No defect or patent foramen ovale was identified.  Impressions:  - Compared to November 2018, global LV longitudinal strain remains normal (has increased)   07/11/2017 PET scan   Increased size and hypermetabolic activity of 3.3 cm right thyroid lobe nodule. Thyroid carcinoma cannot be excluded. Recommend repeat ultrasound guided fine needle aspiration to exclude thyroid carcinoma.  New adjacent hypermetabolic 10 mm right supraclavicular lymph node, suspicious for lymph node metastasis.  Slight increase in size and hypermetabolic activity of solitary right paratracheal lymph node.  No evidence of abdominal or pelvic metastatic disease.   07/18/2017 Procedure   1. Technically successful ultrasound guided fine needle aspiration of indeterminate hypermetabolic right-sided thyroid nodule/mass. 2. Technically successful ultrasound-guided core needle biopsy of hypermetabolic right lower cervical lymph node.   07/18/2017 Pathology Results   THYROID, FINE NEEDLE ASPIRATION RIGHT (SPECIMEN 1 OF 1, COLLECTED ON 07/18/2017): ATYPIA OF UNDETERMINED SIGNIFICANCE OR FOLLICULAR LESION OF UNDETERMINED SIGNIFICANCE (BETHESDA CATEGORY III). SEE COMMENT. COMMENT: THE SPECIMEN CONSISTS OF SMALL AND MEDIUM SIZED GROUPS OF FOLLICULAR EPITHELIAL CELLS WITH MILD CYTOLOGIC ATYPIA INCLUDING NUCLEAR ENLARGEMENT AND HURTHLE CELL CHANGE. SOME GROUPS ARE ARRANGED AS MICROFOLLICLES. THERE  IS MINIMAL BACKGROUND COLLOID. BASED ON THESE FEATURES, A FOLLICULAR LESION/NEOPLASM CAN NOT BE ENTIRELY RULED OUT. A SPECIMEN WILL BE SENT FOR AFIRMA TESTING.   07/19/2017 Pathology Results   Lymph node, needle/core biopsy - METASTATIC PAPILLARY SEROUS CARCINOMA. - SEE COMMENT. Microscopic Comment Dr. Valinda Hoar has reviewed the case and concurs with this interpretation   10/15/2017 PET scan   No new or progressive disease. No evidence of abdominal or pelvic metastatic disease.  Stable hypermetabolic right thyroid lobe nodule and adjacent right supraclavicular lymph node.  Decreased size and hypermetabolic activity of solitary right paratracheal lymph node.   01/23/2018 PET scan  1. Overall, no significant change from PET-CT of 3 months ago. There is persistent hypermetabolic activity at the right thoracic inlet and within a right paratracheal mediastinal node. The nodes have not significantly changed in size, although the metabolic activity has minimally increased over this interval. It is uncertain how much of the thoracic inlet metabolic activity is attributed to the thyroid nodule versus adjacent cervical lymph nodes, both previously biopsied. 2. No new hypermetabolic activity within the neck, chest, abdomen or pelvis. No evidence of local recurrence in the pelvis. 3. Stable probable radiation changes in the right lung.   04/16/2018 PET scan   1. Interval increase in metabolic activity of the RIGHT supraclavicular node and RIGHT lower paratracheal mediastinal node with minimal change in size. Findings consistent with persistent and mildly progressed metastatic adenopathy. 2. New hypermetabolic small lymph node in the RIGHT lower paratracheal nodal station adjacent to the previously followed node. 3. No evidence of local recurrence in the pelvis or new disease elsewhere.   07/30/2018 PET scan   1. Mild interval progression of right supraclavicular, mediastinal, and right hilar  hypermetabolic metastatic disease. There is a new hypermetabolic lymph node in the subcarinal station on today's study. 2. No hypermetabolic disease in the abdomen or pelvis to suggest recurrent/metastases. 3.  Aortic Atherosclerois (ICD10-170.0)     08/16/2018 -  Chemotherapy   The patient had Lenvima since 3/6 and pembrolizumab since 3/13 for chemotherapy treatment. Assunta Curtis was held temporarily due to infection and severe hypertension   10/31/2018 PET scan   Partial response to therapy, with decreased hypermetabolic lymphadenopathy in mediastinum and right hilar region.   No significant change in hypermetabolic lymphadenopathy in right inferior neck.   No new or progressive metastatic disease identified. No evidence of recurrent or metastatic carcinoma in the pelvis or abdomen   02/04/2019 PET scan   1. Decrease in metabolic activity of RIGHT supraclavicular node and RIGHT paratracheal lymph nodes. 2. Persistent activity in the RIGHT thyroid nodule unchanged. 3. Diffuse activity through the esophagus is favored esophagitis. No change. 4. Scattered foci of intense metabolic activity associated the bowel without focal lesion on CT favored benign. 5. No evidence of peritoneal metastasis. 6. No evidence of metastatic adenopathy in the abdomen pelvis. 7. No evidence of local pelvic sidewall recurrence.   07/30/2019 PET scan   1. Mildly increased uptake within small nodes along the right sternocleidomastoid without change in size and associated with increased deltoid activity may reflect changes related to right arm injection or recent COVID vaccination, consider clinical correlation and attention on follow-up. 2. Persistent activity in the right thyroid nodule, unchanged from previous exams. 3. New activity in a juxta esophageal lymph node in the chest is suspicious though the node is unchanged with respect to size. Endoscopic correlation could potentially be helpful or close attention on  follow-up. 4. Subtle increased FDG uptake along the left mainstem bronchus and adjacent to the aorta may represent a small lymph node. No discrete correlate is demonstrated on today's study.   10/22/2019 PET scan   1. Persistent FDG avid subcentimeter right supraclavicular and juxta esophageal lymph nodes. The degree of FDG uptake is similar to the previous exam. No new or progressive findings identified. 2. No findings of solid organ metastasis, skeletal metastasis or metastatic disease to the abdomen or pelvis. 3. FDG avid right lobe of thyroid gland nodule is again noted This nodule is not incidental and been worked up previously with 2 biopsies. Please refer to results from the most  recent biopsy dated 07/18/2017. (Ref: J Am Coll Radiol. 2015 Feb;12(2): 143-50). 4.  Aortic Atherosclerosis (ICD10-I70.0).   05/05/2020 PET scan   1. Progressive disease, as evidenced by increased hypermetabolism within right supraclavicular and mediastinal nodes. 2. No infradiaphragmatic hypermetabolic metastasis. 3.  Aortic Atherosclerosis (ICD10-I70.0). 4. Hypermetabolic right thyroid nodule has been detailed on prior exams and was biopsied on 07/18/2017   09/10/2020 PET scan   1. Interval improvement in previously demonstrated hypermetabolic mediastinal and right supraclavicular adenopathy. A single hypermetabolic left paraesophageal lymph node remains, improved from previous study. The other nodes have resolved.  2. No progressive disease. 3. Diffuse esophageal hypermetabolic activity suggesting esophagitis. 4. Hypermetabolic right thyroid nodule has been previously biopsied.   03/18/2021 PET scan   1. No significant change in single residual FDG avid left paraesophageal lymph node. This has an SUV max of 7.03, compared with 6.4 previously. No new sites of disease identified. 2. Diffuse esophageal hypermetabolic activity suggestive of esophagitis. 3. FDG avid right lobe of thyroid gland nodule. This has been  biopsied previously. (Ref: J Am Coll Radiol. 2015 Feb;12(2): 143-50).     09/22/2021 PET scan   1. Persistent mild diffuse FDG uptake in the esophagus likely chronic inflammation. 2. Resolution of the left paraesophageal lymph node seen on the prior study. 3. Stable hypermetabolic right thyroid nodule, previously biopsied. 4. No findings for recurrent abdominal/pelvic disease or metastatic disease.     02/23/2022 -  Chemotherapy   Patient is on Treatment Plan : UTERINE Lenvatinib (20) D1-21 + Pembrolizumab (200) D1 q21d     05/04/2022 PET scan   1. Status post hysterectomy without evidence of hypermetabolic local recurrence or hypermetabolic metastatic disease. 2. Persistent diffuse long segment hypermetabolic esophageal activity, likely reflects chronic esophagitis. Consider further evaluation with endoscopy if not previously performed. 3.  Aortic Atherosclerosis (ICD10-I70.0).     11/09/2022 PET scan   NM PET Image Restage (PS) Skull Base to Thigh (F-18 FDG)  Result Date: 11/09/2022 CLINICAL DATA:  Subsequent treatment strategy for endometrial carcinoma status post hysterectomy. EXAM: NUCLEAR MEDICINE PET SKULL BASE TO THIGH TECHNIQUE: 7.6 mCi F-18 FDG was injected intravenously. Full-ring PET imaging was performed from the skull base to thigh after the radiotracer. CT data was obtained and used for attenuation correction and anatomic localization. Fasting blood glucose: 93 mg/dl COMPARISON:  46/96/2952 FINDINGS: Mediastinal blood pool activity: SUV max 2.44 Liver activity: SUV max NA NECK: No hypermetabolic lymph nodes in the neck. Hypermetabolic right lobe of thyroid gland nodule is again noted with SUV max of 10.86, previously 11.9. Incidental CT findings: None. CHEST: No hypermetabolic mediastinal or hilar nodes. No suspicious pulmonary nodules on the CT scan. Mild diffuse increased radiotracer uptake within the esophagus is again noted with SUV max 5.28. Previously this measured the same. No  discrete mass identified. Incidental CT findings: Aortic atherosclerosis. ABDOMEN/PELVIS: No abnormal hypermetabolic activity within the liver, pancreas, adrenal glands, or spleen. No hypermetabolic lymph nodes in the abdomen or pelvis. Incidental CT findings: Status post hysterectomy. Aortic atherosclerotic calcifications. SKELETON: No focal hypermetabolic activity to suggest skeletal metastasis. Incidental CT findings: None. IMPRESSION: 1. No specific findings identified to suggest locally recurrent tumor or metastatic disease status post hysterectomy. 2. Persistent, mild, diffuse increased radiotracer uptake within the esophagus. These findings are favored to represent chronic esophagitis. 3. No new or progressive disease identified. 4. Persistent hypermetabolic right lobe of thyroid gland nodule. This has been evaluated on previous imaging and biopsy. (Ref: J Am Coll Radiol. 2015 Feb;12(2): 143-50).  5. Aortic Atherosclerosis (ICD10-I70.0). Electronically Signed   By: Signa Kell M.D.   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 Lenvima was held temporarily due to infection and severe hypertension     PHYSICAL EXAMINATION: ECOG PERFORMANCE STATUS: 1 - Symptomatic but completely ambulatory  Vitals:   12/21/22 1037  BP: (!) 142/71  Pulse: 65  Resp: 18  SpO2: 99%   Filed Weights   12/21/22 1037  Weight: 153 lb (69.4 kg)    GENERAL:alert, no distress and comfortable NEURO: alert & oriented x 3 with fluent speech, no focal motor/sensory deficits  LABORATORY DATA:  I have reviewed the data as listed    Component Value Date/Time   NA 137 12/21/2022 1001   NA 141 05/17/2017 0957   K 4.1 12/21/2022 1001   K 3.6 05/17/2017 0957   CL 102 12/21/2022 1001   CL  105 11/19/2012 0848   CO2 28 12/21/2022 1001   CO2 25 05/17/2017 0957   GLUCOSE 80 12/21/2022 1001   GLUCOSE 85 05/17/2017 0957   GLUCOSE 122 (H) 11/19/2012 0848   BUN 25 (H) 12/21/2022 1001   BUN 15.2 05/17/2017 0957   CREATININE 0.90 12/21/2022 1001   CREATININE 0.8 05/17/2017 0957   CALCIUM 9.9 12/21/2022 1001   CALCIUM 9.6 05/17/2017 0957   PROT 7.3 12/21/2022 1001   PROT 7.0 05/17/2017 0957   ALBUMIN 4.1 12/21/2022 1001   ALBUMIN 3.9 05/17/2017 0957   AST 22 12/21/2022 1001   AST 25 05/17/2017 0957   ALT 16 12/21/2022 1001   ALT 18 05/17/2017 0957   ALKPHOS 49 12/21/2022 1001   ALKPHOS 57 05/17/2017 0957   BILITOT 0.6 12/21/2022 1001   BILITOT 0.47 05/17/2017 0957   GFRNONAA >60 12/21/2022 1001   GFRAA >60 02/26/2020 0940   GFRAA >60 07/10/2019 1240    No results found for: "SPEP", "UPEP"  Lab Results  Component Value Date   WBC 3.7 (L) 12/21/2022   NEUTROABS 2.2 12/21/2022   HGB 10.9 (L) 12/21/2022   HCT 32.1 (L) 12/21/2022   MCV 94.7 12/21/2022   PLT 194 12/21/2022      Chemistry      Component Value Date/Time   NA 137 12/21/2022 1001   NA 141 05/17/2017 0957   K 4.1 12/21/2022 1001   K 3.6 05/17/2017 0957   CL 102 12/21/2022 1001   CL 105 11/19/2012 0848   CO2 28 12/21/2022 1001   CO2 25 05/17/2017 0957   BUN 25 (H) 12/21/2022 1001   BUN 15.2 05/17/2017 0957   CREATININE 0.90 12/21/2022 1001   CREATININE 0.8 05/17/2017 0957   GLU 158 (H) 01/14/2015 1035      Component Value Date/Time   CALCIUM 9.9 12/21/2022 1001   CALCIUM 9.6 05/17/2017 0957   ALKPHOS 49 12/21/2022 1001   ALKPHOS 57 05/17/2017 0957   AST 22 12/21/2022 1001   AST 25 05/17/2017 0957   ALT 16 12/21/2022 1001   ALT 18 05/17/2017 0957   BILITOT 0.6 12/21/2022 1001   BILITOT 0.47 05/17/2017 0957

## 2022-12-21 NOTE — Telephone Encounter (Signed)
Faxed referral to [facility] GI Dr. Bosie Clos at [phone], received fax confirmation.

## 2022-12-21 NOTE — Assessment & Plan Note (Signed)
The patient desire dental work She might need orthodontic services to her gum I recommend the patient to hold Lenvima for minimum 1 month before her surgery The patient will contact me as soon as she knows the date of her procedure

## 2022-12-21 NOTE — Assessment & Plan Note (Signed)
Her last PET CT scan is normal Previously noted left paraesophageal lymphadenopathy that was previously treated with radiation therapy has normalized since April of 2023 I recommend we continue surveillance imaging study every 6 months and if she has no signs of disease progression in April 2025, we can consider discontinuation of systemic treatment So far, she tolerated treatment well without any side effects Due to slight abnormalities noted in her esophagus, recommend GI referral; for some reason, despite referral a month ago, she has not received a phone call We will call the GI service again

## 2022-12-21 NOTE — Assessment & Plan Note (Signed)
She will continue monthly vitamin B12 injection Last B12 level in January was adequate

## 2022-12-21 NOTE — Assessment & Plan Note (Signed)
She has intermittent elevated TSH I will adjust her thyroid medicine accordingly 

## 2022-12-22 ENCOUNTER — Other Ambulatory Visit (HOSPITAL_COMMUNITY): Payer: Self-pay

## 2022-12-25 ENCOUNTER — Other Ambulatory Visit: Payer: Self-pay

## 2022-12-28 ENCOUNTER — Other Ambulatory Visit: Payer: Self-pay

## 2023-01-01 ENCOUNTER — Other Ambulatory Visit (HOSPITAL_COMMUNITY): Payer: Self-pay

## 2023-01-12 ENCOUNTER — Other Ambulatory Visit (HOSPITAL_COMMUNITY): Payer: Self-pay

## 2023-01-12 DIAGNOSIS — F419 Anxiety disorder, unspecified: Secondary | ICD-10-CM | POA: Diagnosis not present

## 2023-01-12 MED ORDER — BUSPIRONE HCL 10 MG PO TABS
10.0000 mg | ORAL_TABLET | Freq: Two times a day (BID) | ORAL | 3 refills | Status: DC
  Filled 2023-03-20: qty 180, 90d supply, fill #0
  Filled 2023-07-02: qty 180, 90d supply, fill #1
  Filled 2023-09-26: qty 180, 90d supply, fill #2

## 2023-01-12 MED ORDER — VENLAFAXINE HCL ER 150 MG PO CP24
300.0000 mg | ORAL_CAPSULE | Freq: Every day | ORAL | 3 refills | Status: DC
  Filled 2023-02-07: qty 180, 90d supply, fill #0
  Filled 2023-05-14: qty 180, 90d supply, fill #1
  Filled 2023-08-11: qty 180, 90d supply, fill #2

## 2023-01-17 ENCOUNTER — Other Ambulatory Visit (HOSPITAL_COMMUNITY): Payer: Self-pay

## 2023-01-23 ENCOUNTER — Other Ambulatory Visit (HOSPITAL_COMMUNITY): Payer: Self-pay

## 2023-01-29 ENCOUNTER — Other Ambulatory Visit (HOSPITAL_COMMUNITY): Payer: Self-pay

## 2023-01-30 ENCOUNTER — Other Ambulatory Visit: Payer: Self-pay

## 2023-01-31 DIAGNOSIS — G8929 Other chronic pain: Secondary | ICD-10-CM | POA: Diagnosis not present

## 2023-01-31 DIAGNOSIS — M25561 Pain in right knee: Secondary | ICD-10-CM | POA: Diagnosis not present

## 2023-02-01 ENCOUNTER — Inpatient Hospital Stay: Payer: 59 | Attending: Hematology and Oncology | Admitting: Hematology and Oncology

## 2023-02-01 ENCOUNTER — Encounter: Payer: Self-pay | Admitting: Hematology and Oncology

## 2023-02-01 ENCOUNTER — Inpatient Hospital Stay: Payer: 59

## 2023-02-01 ENCOUNTER — Other Ambulatory Visit (HOSPITAL_COMMUNITY): Payer: Self-pay

## 2023-02-01 VITALS — BP 142/74 | HR 71 | Temp 98.2°F | Resp 18

## 2023-02-01 VITALS — BP 137/66 | HR 82 | Temp 98.2°F | Resp 18 | Ht 64.0 in | Wt 153.4 lb

## 2023-02-01 DIAGNOSIS — E039 Hypothyroidism, unspecified: Secondary | ICD-10-CM

## 2023-02-01 DIAGNOSIS — Z79620 Long term (current) use of immunosuppressive biologic: Secondary | ICD-10-CM | POA: Insufficient documentation

## 2023-02-01 DIAGNOSIS — C541 Malignant neoplasm of endometrium: Secondary | ICD-10-CM

## 2023-02-01 DIAGNOSIS — I10 Essential (primary) hypertension: Secondary | ICD-10-CM

## 2023-02-01 DIAGNOSIS — C770 Secondary and unspecified malignant neoplasm of lymph nodes of head, face and neck: Secondary | ICD-10-CM | POA: Diagnosis not present

## 2023-02-01 DIAGNOSIS — D61818 Other pancytopenia: Secondary | ICD-10-CM

## 2023-02-01 DIAGNOSIS — Z5112 Encounter for antineoplastic immunotherapy: Secondary | ICD-10-CM | POA: Diagnosis not present

## 2023-02-01 DIAGNOSIS — Z923 Personal history of irradiation: Secondary | ICD-10-CM | POA: Diagnosis not present

## 2023-02-01 DIAGNOSIS — Z95828 Presence of other vascular implants and grafts: Secondary | ICD-10-CM

## 2023-02-01 LAB — CMP (CANCER CENTER ONLY)
ALT: 14 U/L (ref 0–44)
AST: 19 U/L (ref 15–41)
Albumin: 4.1 g/dL (ref 3.5–5.0)
Alkaline Phosphatase: 45 U/L (ref 38–126)
Anion gap: 6 (ref 5–15)
BUN: 21 mg/dL (ref 8–23)
CO2: 29 mmol/L (ref 22–32)
Calcium: 9.8 mg/dL (ref 8.9–10.3)
Chloride: 103 mmol/L (ref 98–111)
Creatinine: 0.81 mg/dL (ref 0.44–1.00)
GFR, Estimated: >60 mL/min (ref >60)
Glucose, Bld: 114 mg/dL — ABNORMAL HIGH (ref 70–99)
Potassium: 4 mmol/L (ref 3.5–5.1)
Sodium: 138 mmol/L (ref 135–145)
Total Bilirubin: 0.5 mg/dL (ref 0.3–1.2)
Total Protein: 7.2 g/dL (ref 6.5–8.1)

## 2023-02-01 LAB — CBC WITH DIFFERENTIAL (CANCER CENTER ONLY)
Abs Immature Granulocytes: 0.01 10*3/uL (ref 0.00–0.07)
Basophils Absolute: 0 10*3/uL (ref 0.0–0.1)
Basophils Relative: 1 %
Eosinophils Absolute: 0.2 10*3/uL (ref 0.0–0.5)
Eosinophils Relative: 6 %
HCT: 32.2 % — ABNORMAL LOW (ref 36.0–46.0)
Hemoglobin: 10.5 g/dL — ABNORMAL LOW (ref 12.0–15.0)
Immature Granulocytes: 0 %
Lymphocytes Relative: 22 %
Lymphs Abs: 0.8 10*3/uL (ref 0.7–4.0)
MCH: 31.5 pg (ref 26.0–34.0)
MCHC: 32.6 g/dL (ref 30.0–36.0)
MCV: 96.7 fL (ref 80.0–100.0)
Monocytes Absolute: 0.3 10*3/uL (ref 0.1–1.0)
Monocytes Relative: 7 %
Neutro Abs: 2.2 10*3/uL (ref 1.7–7.7)
Neutrophils Relative %: 64 %
Platelet Count: 186 10*3/uL (ref 150–400)
RBC: 3.33 MIL/uL — ABNORMAL LOW (ref 3.87–5.11)
RDW: 13.6 % (ref 11.5–15.5)
WBC Count: 3.5 10*3/uL — ABNORMAL LOW (ref 4.0–10.5)
nRBC: 0 % (ref 0.0–0.2)

## 2023-02-01 LAB — TOTAL PROTEIN, URINE DIPSTICK: Protein, ur: NEGATIVE mg/dL

## 2023-02-01 LAB — TSH: TSH: 3.13 u[IU]/mL (ref 0.350–4.500)

## 2023-02-01 MED ORDER — METOPROLOL TARTRATE 25 MG PO TABS
25.0000 mg | ORAL_TABLET | Freq: Two times a day (BID) | ORAL | 3 refills | Status: DC
  Filled 2023-02-01: qty 180, 90d supply, fill #0
  Filled 2023-05-03: qty 180, 90d supply, fill #1
  Filled 2023-07-31: qty 180, 90d supply, fill #2
  Filled 2023-10-26: qty 180, 90d supply, fill #3

## 2023-02-01 MED ORDER — SODIUM CHLORIDE 0.9% FLUSH
10.0000 mL | INTRAVENOUS | Status: DC | PRN
  Administered 2023-02-01: 10 mL

## 2023-02-01 MED ORDER — HEPARIN SOD (PORK) LOCK FLUSH 100 UNIT/ML IV SOLN
500.0000 [IU] | Freq: Once | INTRAVENOUS | Status: AC | PRN
  Administered 2023-02-01: 500 [IU]

## 2023-02-01 MED ORDER — SODIUM CHLORIDE 0.9 % IV SOLN: Freq: Once | INTRAVENOUS | Status: AC

## 2023-02-01 MED ORDER — SODIUM CHLORIDE 0.9 % IV SOLN
400.0000 mg | Freq: Once | INTRAVENOUS | Status: AC
  Administered 2023-02-01: 400 mg via INTRAVENOUS
  Filled 2023-02-01: qty 16

## 2023-02-01 MED ORDER — SODIUM CHLORIDE 0.9% FLUSH
10.0000 mL | Freq: Once | INTRAVENOUS | Status: AC
  Administered 2023-02-01: 10 mL

## 2023-02-01 NOTE — Assessment & Plan Note (Signed)
Her blood pressure is stable She will continue Lenvima and her current blood pressure treatment I refilled her prescription of metoprolol today

## 2023-02-01 NOTE — Progress Notes (Signed)
[facility] OFFICE PROGRESS NOTE  Patient Care Team: Sigmund Hazel, MD as PCP - General (Family Medicine) Adrian Prince, MD as Consulting Physician (Endocrinology)  ASSESSMENT & PLAN:  Endometrial cancer [facility]) Her last PET CT scan is normal Previously noted left paraesophageal lymphadenopathy that was previously treated with radiation therapy has normalized since April of 2023 I recommend we continue surveillance imaging study every 6 months and if she has no signs of disease progression in April 2025, we can consider discontinuation of systemic treatment So far, she tolerated treatment well without any side effects We discussed plan of care.  Her next PET/CT imaging will be in December   Essential hypertension Her blood pressure is stable She will continue Lenvima and her current blood pressure treatment I refilled her prescription of metoprolol today  Pancytopenia, acquired (HCC) Her pancytopenia is stable She will continue vitamin B12 injection  Orders Placed This Encounter  Procedures   CBC with Differential (Cancer Center Only)    Standing Status:   Future    Standing Expiration Date:   03/14/2024   CMP (Cancer Center only)    Standing Status:   Future    Standing Expiration Date:   03/14/2024    All questions were answered. The patient knows to call the clinic with any problems, questions or concerns. The total time spent in the appointment was 25 minutes encounter with patients including review of chart and various tests results, discussions about plan of care and coordination of care plan   Artis Delay, MD 02/01/2023 10:48 AM  INTERVAL HISTORY: Please see below for problem oriented charting. she returns for treatment follow-up on Lenvima and pembrolizumab She tolerated recent treatment well No major side effects Her GI appointment is scheduled for next month  REVIEW OF SYSTEMS:   Constitutional: Denies fevers, chills or abnormal weight loss Eyes:  Denies blurriness of vision Ears, nose, mouth, throat, and face: Denies mucositis or sore throat Respiratory: Denies cough, dyspnea or wheezes Cardiovascular: Denies palpitation, chest discomfort or lower extremity swelling Gastrointestinal:  Denies nausea, heartburn or change in bowel habits Skin: Denies abnormal skin rashes Lymphatics: Denies new lymphadenopathy or easy bruising Neurological:Denies numbness, tingling or new weaknesses Behavioral/Psych: Mood is stable, no new changes  All other systems were reviewed with the patient and are negative.  I have reviewed the past medical history, past surgical history, social history and family history with the patient and they are unchanged from previous note.  ALLERGIES:  is allergic to lenvima [lenvatinib].  MEDICATIONS:  Current Outpatient Medications  Medication Sig Dispense Refill   amLODipine (NORVASC) 10 MG tablet TAKE 1 TABLET BY MOUTH ONCE DAILY. **DECREASE SIMVASTATIN TO 20 MG DAILY WHILE ON AMLODIPINE PER MD** 90 tablet 1   busPIRone (BUSPAR) 10 MG tablet Take 1 tablet (10 mg total) by mouth 2 (two) times daily. 180 tablet 3   Cholecalciferol (VITAMIN D3) 2000 units TABS Take 2,000 Units by mouth daily.      cyanocobalamin (DODEX) 1000 MCG/ML injection Inject 1 mL into the muscle every 30 days. 1 mL 11   hydrochlorothiazide (MICROZIDE) 12.5 MG capsule Take 1 capsule (12.5 mg total) by mouth daily. 90 capsule 3   lenvatinib 10 mg daily dose (LENVIMA) capsule TAKE 1 CAPSULE (10 MG TOTAL) BY MOUTH DAILY. 30 each 11   levothyroxine (SYNTHROID) 100 MCG tablet Take 1 tablet (100 mcg total) by mouth daily before breakfast. 60 tablet 1   losartan (COZAAR) 25 MG tablet Take 1 tablet (25 mg  total) by mouth daily. 90 tablet 3   metoprolol tartrate (LOPRESSOR) 25 MG tablet Take 1 tablet (25 mg total) by mouth 2 (two) times daily. 180 tablet 3   ondansetron (ZOFRAN) 8 MG tablet Take 1 tablet (8 mg total) by mouth every 8 (eight) hours as  needed for nausea. 30 tablet 3   pantoprazole (PROTONIX) 40 MG tablet Take 1 tablet (40 mg total) by mouth daily. 90 tablet 3   simvastatin (ZOCOR) 20 MG tablet Take 1 tablet (20 mg total) by mouth daily in the evening. 90 tablet 4   sodium fluoride (PREVIDENT 5000 PLUS) 1.1 % CREA dental cream Use as directed 51 g 12   SYRINGE-NEEDLE, DISP, 3 ML 25G X 5/8" 3 ML MISC Use as directed to administer B12 shot every 30 days. 12 each 0   tacrolimus (PROTOPIC) 0.1 % ointment Apply to affected area on the skin twice a day 30 g 10   venlafaxine XR (EFFEXOR XR) 150 MG 24 hr capsule Take 2 capsules (300 mg total) by mouth daily in the morning with food. 180 capsule 3   No current facility-administered medications for this visit.   Facility-Administered Medications Ordered in Other Visits  Medication Dose Route Frequency Provider Last Rate Last Admin   heparin lock flush 100 unit/mL  500 Units Intracatheter Once PRN Bertis Ruddy, Lionell Matuszak, MD       pembrolizumab (KEYTRUDA) 400 mg in sodium chloride 0.9 % 50 mL chemo infusion  400 mg Intravenous Once Bertis Ruddy, Kendry Pfarr, MD       sodium chloride flush (NS) 0.9 % injection 10 mL  10 mL Intracatheter PRN Artis Delay, MD        SUMMARY OF ONCOLOGIC HISTORY: Oncology History Overview Note  [employer] testing done 11-2015 on surgical path from 2014:    MS stable   TMB low  4 muts/mb   ATM [id number]   ERBB3 T389K   E2H2 rearrangement exon 9   PPP2R1A P179R   TP53 I195N    ER- APPROXIMATELY 25-35% STAINING IN NEOPLASTIC CELLS (INTERMEDIATE)  PR- APPROXIMATELY 25-35% STAINING IN NEOPLASTIC CELLS (STRONG)  Repeat biopsy 04/02/17: ER negative, Her 2 negative  Progressed on Doxil   Endometrial cancer (HCC)  06/20/2012 Pathology Results   Biopsy positive for papillary serous carcinoma   06/20/2012 Genetic Testing   [employer] One testing done 11-2015 on surgical path from 2014:    MS stable   TMB low  4 muts/mb   ATM [id number]   ERBB3 T389K   E2H2 rearrangement exon  9   PPP2R1A P179R   TP53 I195N    06/20/2012 Initial Diagnosis   Patient presented to PCP with intermittent vaginal bleeding since ~ Oct 2013, endometrial biopsy 06-20-12 with complex endometrial hyperplasia with atypia   06/26/2012 Imaging   Thickened endometrial lining in a postmenopausal patient experiencing vaginal bleeding. In the setting of post-menopausal bleeding, endometrial sampling is indicated to exclude carcinoma. No focal myometrial abnormalities are seen.  Normal left ovary and non-visualized right ovary   07/30/2012 Surgery   Dr. Clifton James performed robotic hysterectomy with bilateral salpingo-oophorectomy, bilateral pelvic lymph node dissection and periaortic lymph node dissection. Intraoperatively on frozen section, the patient was noted to have a large endometrial polyp with changes within the polyp consistent for high-grade malignancy, possibly papillary serous carcinoma. There is no obvious extrauterine disease noted.     07/30/2012 Pathology Results   [phone] SUPPLEMENTAL REPORT  THE ENDOMETRIAL CARCINOMA WAS ANALYZED FOR DNA MISMATCH REPAIR PROTEINS.  IMMUNOHISTOCHEMICALLY, THE  NEOPLASM RETAINED NUCLEAR EXPRESSION OF 4 GENE PRODUCTS, MLH1, MSH2, MSH6, AND PMS2, INVOLVED IN DNA MISMATCH REPAIR.  POSITIVE AND NEGATIVE CONTROLS WORKED APPROPRIATELY.  PER REQUEST, AN ER AND PR ARE PERFORMED ON BLOCK 1I.  ER- APPROXIMATELY 25-35% STAINING IN NEOPLASTIC CELLS (INTERMEDIATE)  PR- APPROXIMATELY 25-35% STAINING IN NEOPLASTIC CELLS (STRONG)  ER AND PR PREDOMINANTLY SHOW STAINING IN THE SEROUS COMPONENT.  DIAGNOSIS  1. UTERUS, CERVIX WITH BILATERAL FALLOPIAN TUBES AND OVARIES,  HYSTERECTOMY AND BILATERAL SALPINGO-OOPHORECTOMY:  HIGH GRADE/POORLY DIFFERENTIATED ENDOMETRIAL ADENOCARCINOMA WITH SOLID AND SEROUS PAPILLARY COMPONENTS.  HISTOPATHOLOGIC TYPE:    A VARIETY OF PATTERNS WERE PRESENT, ALL OF WHICH SHOULD BE CONSIDERED TO BE HIGH GRADE.  SEROUS PAPILLARY CARCINOMA WAS  SEEN OCCURRING ADJACENT TO SOLID ENDOMETRIAL CARCINOMA WITH MARKED ANAPLASIA AND GIANT CELLS. SIZE: TUMOR MEASURED AT LEAST 4.8 CM IN GREATEST HORIZONTAL DIMENSION.  GRADE: POORLY DIFFERENTIATED OR HIGH GRADE. DEPTH OF INVASION: NO DEFINITE MYOMETRIAL INVOLVEMENT WAS SEEN.  THE TUMOR APPEARED CONFINED TO THE POLYP AS WELL AS SURFACE ENDOMETRIUM. WHERE MYOMETRIAL THICKNESS NOT APPLICABLE. SEROSAL INVOLVEMENT:       NOT DEMONSTRATED.  ENDOCERVICAL INVOLVEMENT:  NOT DEMONSTRATED. RESECTION MARGINS:         FREE OF INVOLVEMENT. EXTRAUTERINE EXTENSION:    NOT DEMONSTRATED. ANGIOLYMPHATIC INVASION:   NOT DEFINITELY SEEN. TOTAL NODES EXAMINED:      22.    PELVIC NODES EXAMINED:  20.    PELVIC NODES INVOLVED:  0.    PARA-AORTIC NODES       2. EXAMINED:    PARA-AORTIC NODES       0. INVOLVED TNM STAGE:                 T1A N0 MX. AJCC STAGE GROUPING:       IA. FIGO STAGE:                IA.   08/26/2012 Imaging   No CT evidence for intra-abdominal or pelvic metastatic disease. Trace free pelvic fluid, presumably postoperative although the date of surgery is not documented in the electronic medical record.   08/26/2012 - 12/13/2012 Chemotherapy   She received 6 cycles of carbo/taxol    10/29/2012 - 11/27/2012 Radiation Therapy   May 27, June 5,  June 11, June 19, November 27, 2012: Proximal vagina 30 Gy in 5 fractions      01/17/2013 Imaging   1.  No evidence of recurrent or metastatic disease. 2.  No acute abnormality involving the abdomen or pelvis. 3.  Mild diffuse hepatic steatosis. 4.  Very small supraumbilical midline anterior abdominal wall hernia containing fat, unchanged   01/22/2014 Imaging   No evidence for metastatic or recurrent disease. 2. No bowel obstruction.  Normal appendix. 3. Small fat containing hernia, stable in appearance. 4. Status post hysterectomy and bilateral oophorectomy   02/19/2014 Imaging   No pulmonary lesions are identified. The abnormality on the chest x-ray is due  to asymmetric left-sided sternoclavicular joint degenerative disease   10/12/2014 Imaging   New mild retroperitoneal lymphadenopathy in the left paraaortic region and proximal left common iliac chain, consistent with metastatic disease. No other sites of metastatic disease identified within the abdomen or pelvis.       11/27/2014 - 02/11/2015 Chemotherapy   She received 4 cycles of carbo/taxol    03/01/2015 PET scan   Single hypermetabolic small retroperitoneal lymph node along the aorta. 2. No evidence of metastatic disease otherwise in the abdomen or pelvis. No evidence local recurrence. 3. Intensely  hypermetabolic enlarged nodule adjacent to the RIGHT lobe of thyroid gland. This presumably represents the biopsied lesion in clinician report which was found to be benign thyroid tissue.   04/05/2015 - 05/13/2015 Radiation Therapy   She received 50.4 gray in 28 fractions with simultaneous integrated boost to 56 gray   06/17/2015 Imaging   No acute process or evidence of metastatic disease in the abdomen or pelvis. Resolution of previously described retroperitoneal adenopathy. 2.  Possible constipation. 3. Atherosclerosis.   06/17/2015 Tumor Marker   Patient's tumor was tested for the following markers: CA125 Results of the tumor marker test revealed 33   08/12/2015 Tumor Marker   Patient's tumor was tested for the following markers: CA125 Results of the tumor marker test revealed 52   08/31/2015 PET scan   Development of right paratracheal hypermetabolic adenopathy, consistent with nodal metastasis. 2. The previously described isolated abdominal retroperitoneal hypermetabolic node has resolved. 3. Persistent hypermetabolic right thyroid nodule, per report previously biopsied. Correlate with those results. 4.  Possible constipation.   09/09/2015 -  Anti-estrogen oral therapy   She has been receiving alternative treatment between megace and tamoxifen   09/09/2015 Tumor Marker   Patient's tumor was  tested for the following markers: CA125 Results of the tumor marker test revealed 37.8   09/20/2015 Procedure   Technically successful ultrasound-guided thyroid aspiration biopsy , dominant right nodule   09/20/2015 Pathology Results   THYROID, RIGHT, FINE NEEDLE ASPIRATION (SPECIMEN 1 OF 1 COLLECTED 09/20/15): FINDINGS CONSISTENT WITH BENIGN THYROID NODULE (BETHESDA CATEGORY II).   10/28/2015 Tumor Marker   Patient's tumor was tested for the following markers: CA125 Results of the tumor marker test revealed 53.2   11/04/2015 Imaging   Stable benign right thyroid nodule   12/16/2015 Tumor Marker   Patient's tumor was tested for the following markers: CA125 Results of the tumor marker test revealed 38.1   03/22/2016 PET scan   Interval progression of hypermetabolic right paratracheal lymph node consistent with metastatic involvement. 2. Stable hypermetabolic right thyroid nodule. Reportedly this has been biopsied in the past.   03/23/2016 Tumor Marker   Patient's tumor was tested for the following markers: CA125 Results of the tumor marker test revealed 62.4   04/13/2016 - 06/01/2016 Radiation Therapy   She received 56 Gy to the chest in 28 fractions    05/09/2016 Imaging   No evidence of left lower extremity deep vein thrombosis. No evidence of a superficial thrombosis of the greater and lesser saphenous veins. Positive for thrombus noted in several varicosities of the calf. No evidence of Baker's cyst on the left.   05/17/2016 Tumor Marker   Patient's tumor was tested for the following markers: CA125 Results of the tumor marker test revealed 9   06/29/2016 Tumor Marker   Patient's tumor was tested for the following markers: CA125 Results of the tumor marker test revealed 5.9   08/04/2016 Tumor Marker   Patient's tumor was tested for the following markers: CA125 Results of the tumor marker test revealed 6.0   09/12/2016 PET scan   Complete metabolic response to therapy, with  resolution of hypermetabolic mediastinal lymphadenopathy since prior exam. No residual or new metastatic disease identified. Stable hypermetabolic right thyroid lobe nodule, which was previously biopsied on 09/20/2015.   03/13/2017 PET scan   1. Solitary focus of recurrent right paratracheal hypermetabolic activity, with a 1.0 cm right lower paratracheal node having a maximum SUV of 9.0. Appearance compatible with recurrent malignancy. 2. Continued hypermetabolic right thyroid nodule,  previously biopsied, presumed benign -correlate with prior biopsy results. 3. Other imaging findings of potential clinical significance: Aortic Atherosclerosis (ICD10-I70.0). Mild cardiomegaly. Prominent stool throughout the colon favors constipation.   04/02/2017 Pathology Results   FINE NEEDLE ASPIRATION, ENDOSCOPIC, (EBUS) 4 R NODE (SPECIMEN 1 OF 2 COLLECTED 04/02/17): MALIGNANT CELLS PRESENT, CONSISTENT WITH CARCINOMA. SEE COMMENT. COMMENT: THE MALIGNANT CELLS ARE POSITIVE FOR P53 AND NEGATIVE FOR ESTROGEN RECEPTOR AND TTF-1. THIS PROFILE IS NON-SPECIFIC, BUT THE P53 POSITIVE STAINING IS SUGGESTIVE OF GYNECOLOGIC PRIMARY.    04/11/2017 Procedure   Successful 8 French right internal jugular vein power port placement with its tip at the SVC/RA junction   04/12/2017 Imaging   Normal LV size with mild LV hypertrophy. EF 55-60%. Normal RV size and systolic function. Aortic valve sclerosis without significant stenosis.   04/18/2017 - 06/14/2017 Chemotherapy   The patient had chemotherapy with Doxil    07/10/2017 Imaging   - Left ventricle: The cavity size was normal. There was mild concentric hypertrophy. Systolic function was normal. The estimated ejection fraction was in the range of 55% to 60%. Wall motion was normal; there were no regional wall motion abnormalities. Doppler parameters are consistent with abnormal left ventricular relaxation (grade 1 diastolic dysfunction). - Aortic valve: Noncoronary cusp  mobility was mildly restricted. - Mitral valve: There was mild regurgitation. - Left atrium: The atrium was mildly dilated. - Atrial septum: No defect or patent foramen ovale was identified.  Impressions:  - Compared to November 2018, global LV longitudinal strain remains normal (has increased)   07/11/2017 PET scan   Increased size and hypermetabolic activity of 3.3 cm right thyroid lobe nodule. Thyroid carcinoma cannot be excluded. Recommend repeat ultrasound guided fine needle aspiration to exclude thyroid carcinoma.  New adjacent hypermetabolic 10 mm right supraclavicular lymph node, suspicious for lymph node metastasis.  Slight increase in size and hypermetabolic activity of solitary right paratracheal lymph node.  No evidence of abdominal or pelvic metastatic disease.   07/18/2017 Procedure   1. Technically successful ultrasound guided fine needle aspiration of indeterminate hypermetabolic right-sided thyroid nodule/mass. 2. Technically successful ultrasound-guided core needle biopsy of hypermetabolic right lower cervical lymph node.   07/18/2017 Pathology Results   THYROID, FINE NEEDLE ASPIRATION RIGHT (SPECIMEN 1 OF 1, COLLECTED ON 07/18/2017): ATYPIA OF UNDETERMINED SIGNIFICANCE OR FOLLICULAR LESION OF UNDETERMINED SIGNIFICANCE (BETHESDA CATEGORY III). SEE COMMENT. COMMENT: THE SPECIMEN CONSISTS OF SMALL AND MEDIUM SIZED GROUPS OF FOLLICULAR EPITHELIAL CELLS WITH MILD CYTOLOGIC ATYPIA INCLUDING NUCLEAR ENLARGEMENT AND HURTHLE CELL CHANGE. SOME GROUPS ARE ARRANGED AS MICROFOLLICLES. THERE IS MINIMAL BACKGROUND COLLOID. BASED ON THESE FEATURES, A FOLLICULAR LESION/NEOPLASM CAN NOT BE ENTIRELY RULED OUT. A SPECIMEN WILL BE SENT FOR AFIRMA TESTING.   07/19/2017 Pathology Results   Lymph node, needle/core biopsy - METASTATIC PAPILLARY SEROUS CARCINOMA. - SEE COMMENT. Microscopic Comment Dr. Valinda Hoar has reviewed the case and concurs with this interpretation   10/15/2017 PET scan    No new or progressive disease. No evidence of abdominal or pelvic metastatic disease.  Stable hypermetabolic right thyroid lobe nodule and adjacent right supraclavicular lymph node.  Decreased size and hypermetabolic activity of solitary right paratracheal lymph node.   01/23/2018 PET scan   1. Overall, no significant change from PET-CT of 3 months ago. There is persistent hypermetabolic activity at the right thoracic inlet and within a right paratracheal mediastinal node. The nodes have not significantly changed in size, although the metabolic activity has minimally increased over this interval. It is uncertain how much of  the thoracic inlet metabolic activity is attributed to the thyroid nodule versus adjacent cervical lymph nodes, both previously biopsied. 2. No new hypermetabolic activity within the neck, chest, abdomen or pelvis. No evidence of local recurrence in the pelvis. 3. Stable probable radiation changes in the right lung.   04/16/2018 PET scan   1. Interval increase in metabolic activity of the RIGHT supraclavicular node and RIGHT lower paratracheal mediastinal node with minimal change in size. Findings consistent with persistent and mildly progressed metastatic adenopathy. 2. New hypermetabolic small lymph node in the RIGHT lower paratracheal nodal station adjacent to the previously followed node. 3. No evidence of local recurrence in the pelvis or new disease elsewhere.   07/30/2018 PET scan   1. Mild interval progression of right supraclavicular, mediastinal, and right hilar hypermetabolic metastatic disease. There is a new hypermetabolic lymph node in the subcarinal station on today's study. 2. No hypermetabolic disease in the abdomen or pelvis to suggest recurrent/metastases. 3.  Aortic Atherosclerois (ICD10-170.0)     08/16/2018 -  Chemotherapy   The patient had Lenvima since 3/6 and pembrolizumab since 3/13 for chemotherapy treatment. Assunta Curtis was held temporarily due to  infection and severe hypertension   10/31/2018 PET scan   Partial response to therapy, with decreased hypermetabolic lymphadenopathy in mediastinum and right hilar region.   No significant change in hypermetabolic lymphadenopathy in right inferior neck.   No new or progressive metastatic disease identified. No evidence of recurrent or metastatic carcinoma in the pelvis or abdomen   02/04/2019 PET scan   1. Decrease in metabolic activity of RIGHT supraclavicular node and RIGHT paratracheal lymph nodes. 2. Persistent activity in the RIGHT thyroid nodule unchanged. 3. Diffuse activity through the esophagus is favored esophagitis. No change. 4. Scattered foci of intense metabolic activity associated the bowel without focal lesion on CT favored benign. 5. No evidence of peritoneal metastasis. 6. No evidence of metastatic adenopathy in the abdomen pelvis. 7. No evidence of local pelvic sidewall recurrence.   07/30/2019 PET scan   1. Mildly increased uptake within small nodes along the right sternocleidomastoid without change in size and associated with increased deltoid activity may reflect changes related to right arm injection or recent COVID vaccination, consider clinical correlation and attention on follow-up. 2. Persistent activity in the right thyroid nodule, unchanged from previous exams. 3. New activity in a juxta esophageal lymph node in the chest is suspicious though the node is unchanged with respect to size. Endoscopic correlation could potentially be helpful or close attention on follow-up. 4. Subtle increased FDG uptake along the left mainstem bronchus and adjacent to the aorta may represent a small lymph node. No discrete correlate is demonstrated on today's study.   10/22/2019 PET scan   1. Persistent FDG avid subcentimeter right supraclavicular and juxta esophageal lymph nodes. The degree of FDG uptake is similar to the previous exam. No new or progressive findings identified. 2. No  findings of solid organ metastasis, skeletal metastasis or metastatic disease to the abdomen or pelvis. 3. FDG avid right lobe of thyroid gland nodule is again noted This nodule is not incidental and been worked up previously with 2 biopsies. Please refer to results from the most recent biopsy dated 07/18/2017. (Ref: J Am Coll Radiol. 2015 Feb;12(2): 143-50). 4.  Aortic Atherosclerosis (ICD10-I70.0).   05/05/2020 PET scan   1. Progressive disease, as evidenced by increased hypermetabolism within right supraclavicular and mediastinal nodes. 2. No infradiaphragmatic hypermetabolic metastasis. 3.  Aortic Atherosclerosis (ICD10-I70.0). 4. Hypermetabolic right thyroid  nodule has been detailed on prior exams and was biopsied on 07/18/2017   09/10/2020 PET scan   1. Interval improvement in previously demonstrated hypermetabolic mediastinal and right supraclavicular adenopathy. A single hypermetabolic left paraesophageal lymph node remains, improved from previous study. The other nodes have resolved.  2. No progressive disease. 3. Diffuse esophageal hypermetabolic activity suggesting esophagitis. 4. Hypermetabolic right thyroid nodule has been previously biopsied.   03/18/2021 PET scan   1. No significant change in single residual FDG avid left paraesophageal lymph node. This has an SUV max of 7.03, compared with 6.4 previously. No new sites of disease identified. 2. Diffuse esophageal hypermetabolic activity suggestive of esophagitis. 3. FDG avid right lobe of thyroid gland nodule. This has been biopsied previously. (Ref: J Am Coll Radiol. 2015 Feb;12(2): 143-50).     09/22/2021 PET scan   1. Persistent mild diffuse FDG uptake in the esophagus likely chronic inflammation. 2. Resolution of the left paraesophageal lymph node seen on the prior study. 3. Stable hypermetabolic right thyroid nodule, previously biopsied. 4. No findings for recurrent abdominal/pelvic disease or metastatic disease.      02/23/2022 -  Chemotherapy   Patient is on Treatment Plan : UTERINE Lenvatinib (20) D1-21 + Pembrolizumab (200) D1 q21d     05/04/2022 PET scan   1. Status post hysterectomy without evidence of hypermetabolic local recurrence or hypermetabolic metastatic disease. 2. Persistent diffuse long segment hypermetabolic esophageal activity, likely reflects chronic esophagitis. Consider further evaluation with endoscopy if not previously performed. 3.  Aortic Atherosclerosis (ICD10-I70.0).     11/09/2022 PET scan   NM PET Image Restage (PS) Skull Base to Thigh (F-18 FDG)  Result Date: 11/09/2022 CLINICAL DATA:  Subsequent treatment strategy for endometrial carcinoma status post hysterectomy. EXAM: NUCLEAR MEDICINE PET SKULL BASE TO THIGH TECHNIQUE: 7.6 mCi F-18 FDG was injected intravenously. Full-ring PET imaging was performed from the skull base to thigh after the radiotracer. CT data was obtained and used for attenuation correction and anatomic localization. Fasting blood glucose: 93 mg/dl COMPARISON:  16/03/9603 FINDINGS: Mediastinal blood pool activity: SUV max 2.44 Liver activity: SUV max NA NECK: No hypermetabolic lymph nodes in the neck. Hypermetabolic right lobe of thyroid gland nodule is again noted with SUV max of 10.86, previously 11.9. Incidental CT findings: None. CHEST: No hypermetabolic mediastinal or hilar nodes. No suspicious pulmonary nodules on the CT scan. Mild diffuse increased radiotracer uptake within the esophagus is again noted with SUV max 5.28. Previously this measured the same. No discrete mass identified. Incidental CT findings: Aortic atherosclerosis. ABDOMEN/PELVIS: No abnormal hypermetabolic activity within the liver, pancreas, adrenal glands, or spleen. No hypermetabolic lymph nodes in the abdomen or pelvis. Incidental CT findings: Status post hysterectomy. Aortic atherosclerotic calcifications. SKELETON: No focal hypermetabolic activity to suggest skeletal metastasis. Incidental  CT findings: None. IMPRESSION: 1. No specific findings identified to suggest locally recurrent tumor or metastatic disease status post hysterectomy. 2. Persistent, mild, diffuse increased radiotracer uptake within the esophagus. These findings are favored to represent chronic esophagitis. 3. No new or progressive disease identified. 4. Persistent hypermetabolic right lobe of thyroid gland nodule. This has been evaluated on previous imaging and biopsy. (Ref: J Am Coll Radiol. 2015 Feb;12(2): 143-50). 5. Aortic Atherosclerosis (ICD10-I70.0). Electronically Signed   By: Signa Kell M.D.   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 Lenvima was held temporarily due to infection and severe hypertension     PHYSICAL EXAMINATION: ECOG PERFORMANCE STATUS: 0 - Asymptomatic  Vitals:   02/01/23 0936  BP: 137/66  Pulse: 82  Resp: 18  Temp: 98.2 F (36.8 C)  SpO2: 97%   Filed Weights   02/01/23 0936  Weight: 153 lb 6.4 oz (69.6 kg)    GENERAL:alert, no distress and comfortable NEURO: alert & oriented x 3 with fluent speech, no focal motor/sensory deficits  LABORATORY DATA:  I have reviewed the data as listed    Component Value Date/Time   NA 138 02/01/2023 0901   NA 141 05/17/2017 0957   K 4.0 02/01/2023 0901   K 3.6 05/17/2017 0957   CL 103 02/01/2023 0901   CL 105 11/19/2012 0848   CO2 29 02/01/2023 0901   CO2 25 05/17/2017 0957   GLUCOSE 114 (H) 02/01/2023 0901   GLUCOSE 85 05/17/2017 0957   GLUCOSE 122 (H) 11/19/2012 0848   BUN 21 02/01/2023 0901   BUN 15.2 05/17/2017 0957   CREATININE 0.81 02/01/2023 0901   CREATININE 0.8 05/17/2017 0957   CALCIUM 9.8 02/01/2023 0901   CALCIUM 9.6 05/17/2017 0957   PROT 7.2 02/01/2023 0901   PROT 7.0 05/17/2017  0957   ALBUMIN 4.1 02/01/2023 0901   ALBUMIN 3.9 05/17/2017 0957   AST 19 02/01/2023 0901   AST 25 05/17/2017 0957   ALT 14 02/01/2023 0901   ALT 18 05/17/2017 0957   ALKPHOS 45 02/01/2023 0901   ALKPHOS 57 05/17/2017 0957   BILITOT 0.5 02/01/2023 0901   BILITOT 0.47 05/17/2017 0957   GFRNONAA >60 02/01/2023 0901   GFRAA >60 02/26/2020 0940   GFRAA >60 07/10/2019 1240    No results found for: "SPEP", "UPEP"  Lab Results  Component Value Date   WBC 3.5 (L) 02/01/2023   NEUTROABS 2.2 02/01/2023   HGB 10.5 (L) 02/01/2023   HCT 32.2 (L) 02/01/2023   MCV 96.7 02/01/2023   PLT 186 02/01/2023      Chemistry      Component Value Date/Time   NA 138 02/01/2023 0901   NA 141 05/17/2017 0957   K 4.0 02/01/2023 0901   K 3.6 05/17/2017 0957   CL 103 02/01/2023 0901   CL 105 11/19/2012 0848   CO2 29 02/01/2023 0901   CO2 25 05/17/2017 0957   BUN 21 02/01/2023 0901   BUN 15.2 05/17/2017 0957   CREATININE 0.81 02/01/2023 0901   CREATININE 0.8 05/17/2017 0957   GLU 158 (H) 01/14/2015 1035      Component Value Date/Time   CALCIUM 9.8 02/01/2023 0901   CALCIUM 9.6 05/17/2017 0957   ALKPHOS 45 02/01/2023 0901   ALKPHOS 57 05/17/2017 0957   AST 19 02/01/2023 0901   AST 25 05/17/2017 0957   ALT 14 02/01/2023 0901   ALT 18 05/17/2017 0957   BILITOT 0.5 02/01/2023 0901   BILITOT 0.47 05/17/2017 0957

## 2023-02-01 NOTE — Assessment & Plan Note (Signed)
Her last PET CT scan is normal Previously noted left paraesophageal lymphadenopathy that was previously treated with radiation therapy has normalized since April of 2023 I recommend we continue surveillance imaging study every 6 months and if she has no signs of disease progression in April 2025, we can consider discontinuation of systemic treatment So far, she tolerated treatment well without any side effects We discussed plan of care.  Her next PET/CT imaging will be in December

## 2023-02-01 NOTE — Patient Instructions (Signed)
[facility]  Discharge Instructions: Thank you for choosing [facility] to provide your oncology and hematology care.   If you have a lab appointment with the Cancer Center, please go directly to the Cancer Center and check in at the registration area.   Wear comfortable clothing and clothing appropriate for easy access to any Portacath or PICC line.   We strive to give you quality time with your provider. You may need to reschedule your appointment if you arrive late (15 or more minutes).  Arriving late affects you and other patients whose appointments are after yours.  Also, if you miss three or more appointments without notifying the office, you may be dismissed from the clinic at the provider's discretion.      For prescription refill requests, have your pharmacy contact our office and allow 72 hours for refills to be completed.    Today you received the following chemotherapy and/or immunotherapy agents keytruda      To help prevent nausea and vomiting after your treatment, we encourage you to take your nausea medication as directed.  BELOW ARE SYMPTOMS THAT SHOULD BE REPORTED IMMEDIATELY: *FEVER GREATER THAN 100.4 F (38 C) OR HIGHER *CHILLS OR SWEATING *NAUSEA AND VOMITING THAT IS NOT CONTROLLED WITH YOUR NAUSEA MEDICATION *UNUSUAL SHORTNESS OF BREATH *UNUSUAL BRUISING OR BLEEDING *URINARY PROBLEMS (pain or burning when urinating, or frequent urination) *BOWEL PROBLEMS (unusual diarrhea, constipation, pain near the anus) TENDERNESS IN MOUTH AND THROAT WITH OR WITHOUT PRESENCE OF ULCERS (sore throat, sores in mouth, or a toothache) UNUSUAL RASH, SWELLING OR PAIN  UNUSUAL VAGINAL DISCHARGE OR ITCHING   Items with * indicate a potential emergency and should be followed up as soon as possible or go to the Emergency Department if any problems should occur.  Please show the CHEMOTHERAPY ALERT CARD or IMMUNOTHERAPY ALERT CARD at  check-in to the Emergency Department and triage nurse.  Should you have questions after your visit or need to cancel or reschedule your appointment, please contact [facility]  Dept: [phone]  and follow the prompts.  Office hours are 8:00 a.m. to 4:30 p.m. Monday - Friday. Please note that voicemails left after 4:00 p.m. may not be returned until the following business day.  We are closed weekends and major holidays. You have access to a nurse at all times for urgent questions. Please call the main number to the clinic Dept: [phone] and follow the prompts.   For any non-urgent questions, you may also contact your provider using MyChart. We now offer e-Visits for anyone 18 and older to request care online for non-urgent symptoms. For details visit [email or web address].   Also download the MyChart app! Go to the app store, search "MyChart", open the app, select , and log in with your MyChart username and password.   

## 2023-02-01 NOTE — Assessment & Plan Note (Signed)
Her pancytopenia is stable She will continue vitamin B12 injection

## 2023-02-02 ENCOUNTER — Other Ambulatory Visit: Payer: Self-pay

## 2023-02-07 ENCOUNTER — Other Ambulatory Visit (HOSPITAL_COMMUNITY): Payer: Self-pay

## 2023-02-07 ENCOUNTER — Other Ambulatory Visit: Payer: Self-pay | Admitting: Hematology and Oncology

## 2023-02-07 ENCOUNTER — Other Ambulatory Visit: Payer: Self-pay

## 2023-02-07 MED ORDER — CYANOCOBALAMIN 1000 MCG/ML IJ SOLN
1000.0000 ug | INTRAMUSCULAR | 11 refills | Status: DC
  Filled 2023-02-07: qty 1, 30d supply, fill #0
  Filled 2023-06-11: qty 1, 30d supply, fill #1
  Filled 2023-10-26: qty 1, 30d supply, fill #2
  Filled 2024-01-11: qty 1, 30d supply, fill #3

## 2023-02-08 ENCOUNTER — Other Ambulatory Visit: Payer: Self-pay

## 2023-02-08 ENCOUNTER — Other Ambulatory Visit (HOSPITAL_COMMUNITY): Payer: Self-pay

## 2023-02-09 ENCOUNTER — Other Ambulatory Visit: Payer: Self-pay

## 2023-02-14 ENCOUNTER — Other Ambulatory Visit (HOSPITAL_COMMUNITY): Payer: Self-pay

## 2023-02-26 ENCOUNTER — Other Ambulatory Visit (HOSPITAL_COMMUNITY): Payer: Self-pay

## 2023-02-26 ENCOUNTER — Other Ambulatory Visit: Payer: Self-pay

## 2023-02-26 ENCOUNTER — Telehealth: Payer: Self-pay | Admitting: Pharmacy Technician

## 2023-02-26 NOTE — Telephone Encounter (Signed)
Oral Oncology Patient Advocate Encounter   Received notification that prior authorization for Diane Lozano is due for renewal.   PA submitted on 02/26/23 Key [id number] Status is pending     Jinger Neighbors, CPhT-Adv Oncology Pharmacy Patient Advocate [facility] Direct Number: [phone]  Fax: [phone]

## 2023-02-27 ENCOUNTER — Other Ambulatory Visit (HOSPITAL_COMMUNITY): Payer: Self-pay

## 2023-02-27 NOTE — Telephone Encounter (Signed)
Oral Oncology Patient Advocate Encounter  Prior Authorization for Assunta Curtis has been approved.    PA# [id number] Effective dates: 02/26/23 through 02/25/24  Patient will continue to fill at [facility].    Jinger Neighbors, CPhT-Adv Oncology Pharmacy Patient Advocate [facility] Direct Number: [phone]  Fax: [phone]

## 2023-03-01 ENCOUNTER — Other Ambulatory Visit: Payer: Self-pay

## 2023-03-05 DIAGNOSIS — K219 Gastro-esophageal reflux disease without esophagitis: Secondary | ICD-10-CM | POA: Diagnosis not present

## 2023-03-14 ENCOUNTER — Other Ambulatory Visit: Payer: Self-pay

## 2023-03-14 ENCOUNTER — Other Ambulatory Visit: Payer: Self-pay | Admitting: Hematology and Oncology

## 2023-03-14 MED ORDER — AMLODIPINE BESYLATE 10 MG PO TABS
10.0000 mg | ORAL_TABLET | Freq: Every day | ORAL | 1 refills | Status: DC
  Filled 2023-03-14: qty 90, 90d supply, fill #0
  Filled 2023-06-11: qty 90, 90d supply, fill #1

## 2023-03-15 ENCOUNTER — Encounter: Payer: Self-pay | Admitting: Hematology and Oncology

## 2023-03-15 ENCOUNTER — Other Ambulatory Visit: Payer: 59

## 2023-03-15 ENCOUNTER — Other Ambulatory Visit (HOSPITAL_COMMUNITY): Payer: Self-pay

## 2023-03-15 ENCOUNTER — Inpatient Hospital Stay: Payer: 59 | Attending: Hematology and Oncology

## 2023-03-15 ENCOUNTER — Inpatient Hospital Stay (HOSPITAL_BASED_OUTPATIENT_CLINIC_OR_DEPARTMENT_OTHER): Payer: 59 | Admitting: Hematology and Oncology

## 2023-03-15 ENCOUNTER — Inpatient Hospital Stay: Payer: 59

## 2023-03-15 ENCOUNTER — Ambulatory Visit: Payer: 59 | Admitting: Hematology and Oncology

## 2023-03-15 ENCOUNTER — Ambulatory Visit: Payer: 59

## 2023-03-15 VITALS — BP 117/83 | HR 76 | Temp 98.0°F | Resp 18 | Wt 152.5 lb

## 2023-03-15 DIAGNOSIS — C541 Malignant neoplasm of endometrium: Secondary | ICD-10-CM | POA: Insufficient documentation

## 2023-03-15 DIAGNOSIS — I10 Essential (primary) hypertension: Secondary | ICD-10-CM | POA: Insufficient documentation

## 2023-03-15 DIAGNOSIS — C770 Secondary and unspecified malignant neoplasm of lymph nodes of head, face and neck: Secondary | ICD-10-CM | POA: Diagnosis not present

## 2023-03-15 DIAGNOSIS — Z5112 Encounter for antineoplastic immunotherapy: Secondary | ICD-10-CM | POA: Diagnosis not present

## 2023-03-15 LAB — CBC WITH DIFFERENTIAL (CANCER CENTER ONLY)
Abs Immature Granulocytes: 0.01 10*3/uL (ref 0.00–0.07)
Basophils Absolute: 0.1 10*3/uL (ref 0.0–0.1)
Basophils Relative: 1 %
Eosinophils Absolute: 0.3 10*3/uL (ref 0.0–0.5)
Eosinophils Relative: 6 %
HCT: 34.3 % — ABNORMAL LOW (ref 36.0–46.0)
Hemoglobin: 11.2 g/dL — ABNORMAL LOW (ref 12.0–15.0)
Immature Granulocytes: 0 %
Lymphocytes Relative: 22 %
Lymphs Abs: 1.1 10*3/uL (ref 0.7–4.0)
MCH: 31.5 pg (ref 26.0–34.0)
MCHC: 32.7 g/dL (ref 30.0–36.0)
MCV: 96.3 fL (ref 80.0–100.0)
Monocytes Absolute: 0.4 10*3/uL (ref 0.1–1.0)
Monocytes Relative: 7 %
Neutro Abs: 3.2 10*3/uL (ref 1.7–7.7)
Neutrophils Relative %: 64 %
Platelet Count: 190 10*3/uL (ref 150–400)
RBC: 3.56 MIL/uL — ABNORMAL LOW (ref 3.87–5.11)
RDW: 13.8 % (ref 11.5–15.5)
WBC Count: 5.1 10*3/uL (ref 4.0–10.5)
nRBC: 0 % (ref 0.0–0.2)

## 2023-03-15 LAB — CMP (CANCER CENTER ONLY)
ALT: 16 U/L (ref 0–44)
AST: 23 U/L (ref 15–41)
Albumin: 4.2 g/dL (ref 3.5–5.0)
Alkaline Phosphatase: 52 U/L (ref 38–126)
Anion gap: 6 (ref 5–15)
BUN: 21 mg/dL (ref 8–23)
CO2: 29 mmol/L (ref 22–32)
Calcium: 9.9 mg/dL (ref 8.9–10.3)
Chloride: 100 mmol/L (ref 98–111)
Creatinine: 0.75 mg/dL (ref 0.44–1.00)
GFR, Estimated: >60 mL/min (ref >60)
Glucose, Bld: 101 mg/dL — ABNORMAL HIGH (ref 70–99)
Potassium: 4.1 mmol/L (ref 3.5–5.1)
Sodium: 135 mmol/L (ref 135–145)
Total Bilirubin: 0.5 mg/dL (ref 0.3–1.2)
Total Protein: 7.6 g/dL (ref 6.5–8.1)

## 2023-03-15 MED ORDER — SODIUM CHLORIDE 0.9 % IV SOLN: Freq: Once | INTRAVENOUS | Status: AC

## 2023-03-15 MED ORDER — SODIUM CHLORIDE 0.9 % IV SOLN
400.0000 mg | Freq: Once | INTRAVENOUS | Status: AC
  Administered 2023-03-15: 400 mg via INTRAVENOUS
  Filled 2023-03-15: qty 16

## 2023-03-15 MED ORDER — HEPARIN SOD (PORK) LOCK FLUSH 100 UNIT/ML IV SOLN
500.0000 [IU] | Freq: Once | INTRAVENOUS | Status: AC | PRN
  Administered 2023-03-15: 500 [IU]

## 2023-03-15 MED ORDER — SODIUM CHLORIDE 0.9% FLUSH
10.0000 mL | INTRAVENOUS | Status: DC | PRN
  Administered 2023-03-15: 10 mL

## 2023-03-15 MED ORDER — HYDROCHLOROTHIAZIDE 12.5 MG PO CAPS
12.5000 mg | ORAL_CAPSULE | Freq: Every day | ORAL | 3 refills | Status: DC
  Filled 2023-03-15 – 2023-06-11 (×2): qty 90, 90d supply, fill #0
  Filled 2023-09-11: qty 90, 90d supply, fill #1

## 2023-03-15 NOTE — Patient Instructions (Signed)
[facility]  Discharge Instructions: Thank you for choosing [facility] to provide your oncology and hematology care.   If you have a lab appointment with the Cancer Center, please go directly to the Cancer Center and check in at the registration area.   Wear comfortable clothing and clothing appropriate for easy access to any Portacath or PICC line.   We strive to give you quality time with your provider. You may need to reschedule your appointment if you arrive late (15 or more minutes).  Arriving late affects you and other patients whose appointments are after yours.  Also, if you miss three or more appointments without notifying the office, you may be dismissed from the clinic at the provider's discretion.      For prescription refill requests, have your pharmacy contact our office and allow 72 hours for refills to be completed.    Today you received the following chemotherapy and/or immunotherapy agents keytruda      To help prevent nausea and vomiting after your treatment, we encourage you to take your nausea medication as directed.  BELOW ARE SYMPTOMS THAT SHOULD BE REPORTED IMMEDIATELY: *FEVER GREATER THAN 100.4 F (38 C) OR HIGHER *CHILLS OR SWEATING *NAUSEA AND VOMITING THAT IS NOT CONTROLLED WITH YOUR NAUSEA MEDICATION *UNUSUAL SHORTNESS OF BREATH *UNUSUAL BRUISING OR BLEEDING *URINARY PROBLEMS (pain or burning when urinating, or frequent urination) *BOWEL PROBLEMS (unusual diarrhea, constipation, pain near the anus) TENDERNESS IN MOUTH AND THROAT WITH OR WITHOUT PRESENCE OF ULCERS (sore throat, sores in mouth, or a toothache) UNUSUAL RASH, SWELLING OR PAIN  UNUSUAL VAGINAL DISCHARGE OR ITCHING   Items with * indicate a potential emergency and should be followed up as soon as possible or go to the Emergency Department if any problems should occur.  Please show the CHEMOTHERAPY ALERT CARD or IMMUNOTHERAPY ALERT CARD at  check-in to the Emergency Department and triage nurse.  Should you have questions after your visit or need to cancel or reschedule your appointment, please contact [facility]  Dept: [phone]  and follow the prompts.  Office hours are 8:00 a.m. to 4:30 p.m. Monday - Friday. Please note that voicemails left after 4:00 p.m. may not be returned until the following business day.  We are closed weekends and major holidays. You have access to a nurse at all times for urgent questions. Please call the main number to the clinic Dept: [phone] and follow the prompts.   For any non-urgent questions, you may also contact your provider using MyChart. We now offer e-Visits for anyone 18 and older to request care online for non-urgent symptoms. For details visit [email or web address].   Also download the MyChart app! Go to the app store, search "MyChart", open the app, select , and log in with your MyChart username and password.   

## 2023-03-15 NOTE — Progress Notes (Signed)
[facility] OFFICE PROGRESS NOTE  Patient Care Team: Sigmund Hazel, MD as PCP - General (Family Medicine) Adrian Prince, MD as Consulting Physician (Endocrinology)  ASSESSMENT & PLAN:  Endometrial cancer [facility]) Her last PET CT scan is normal Previously noted left paraesophageal lymphadenopathy that was previously treated with radiation therapy has normalized since April of 2023 I recommend we continue surveillance imaging study every 6 months and if she has no signs of disease progression in April 2025, we can consider discontinuation of systemic treatment So far, she tolerated treatment well without any side effects We discussed plan of care.  Her next PET/CT imaging will be in December   Essential hypertension Her blood pressure is stable She will continue Lenvima and her current blood pressure treatment I refilled her prescription of HCTz today  Orders Placed This Encounter  Procedures   CBC with Differential (Cancer Center Only)    Standing Status:   Future    Standing Expiration Date:   04/25/2024   CMP (Cancer Center only)    Standing Status:   Future    Standing Expiration Date:   04/25/2024   CBC with Differential (Cancer Center Only)    Standing Status:   Future    Standing Expiration Date:   06/06/2024   CMP (Cancer Center only)    Standing Status:   Future    Standing Expiration Date:   06/06/2024    All questions were answered. The patient knows to call the clinic with any problems, questions or concerns. The total time spent in the appointment was 20 minutes encounter with patients including review of chart and various tests results, discussions about plan of care and coordination of care plan   Artis Delay, MD 03/15/2023 2:36 PM  INTERVAL HISTORY: Please see below for problem oriented charting. she returns for treatment follow-up for Lenvima and pembrolizumab.  She is doing well with no new side effects.  She is scheduled for EGD evaluation next  week We discussed medication refill and future follow-up  REVIEW OF SYSTEMS:   Constitutional: Denies fevers, chills or abnormal weight loss Eyes: Denies blurriness of vision Ears, nose, mouth, throat, and face: Denies mucositis or sore throat Respiratory: Denies cough, dyspnea or wheezes Cardiovascular: Denies palpitation, chest discomfort or lower extremity swelling Gastrointestinal:  Denies nausea, heartburn or change in bowel habits Skin: Denies abnormal skin rashes Lymphatics: Denies new lymphadenopathy or easy bruising Neurological:Denies numbness, tingling or new weaknesses Behavioral/Psych: Mood is stable, no new changes  All other systems were reviewed with the patient and are negative.  I have reviewed the past medical history, past surgical history, social history and family history with the patient and they are unchanged from previous note.  ALLERGIES:  is allergic to lenvima [lenvatinib].  MEDICATIONS:  Current Outpatient Medications  Medication Sig Dispense Refill   amLODipine (NORVASC) 10 MG tablet Take 1 tablet (10 mg total) by mouth daily.**DECREASE SIMVASTATIN TO 20 MG DAILY WHILE ON AMLODIPINE PER MD** 90 tablet 1   busPIRone (BUSPAR) 10 MG tablet Take 1 tablet (10 mg total) by mouth 2 (two) times daily. 180 tablet 3   Cholecalciferol (VITAMIN D3) 2000 units TABS Take 2,000 Units by mouth daily.      cyanocobalamin (DODEX) 1000 MCG/ML injection Inject 1 mL into the muscle every 30 days. 1 mL 11   hydrochlorothiazide (MICROZIDE) 12.5 MG capsule Take 1 capsule (12.5 mg total) by mouth daily. 90 capsule 3   lenvatinib 10 mg daily dose (LENVIMA) capsule TAKE  1 CAPSULE (10 MG TOTAL) BY MOUTH DAILY. 30 each 11   levothyroxine (SYNTHROID) 100 MCG tablet Take 1 tablet (100 mcg total) by mouth daily before breakfast. 60 tablet 1   losartan (COZAAR) 25 MG tablet Take 1 tablet (25 mg total) by mouth daily. 90 tablet 3   metoprolol tartrate (LOPRESSOR) 25 MG tablet Take 1 tablet  (25 mg total) by mouth 2 (two) times daily. 180 tablet 3   ondansetron (ZOFRAN) 8 MG tablet Take 1 tablet (8 mg total) by mouth every 8 (eight) hours as needed for nausea. 30 tablet 3   pantoprazole (PROTONIX) 40 MG tablet Take 1 tablet (40 mg total) by mouth daily. 90 tablet 3   simvastatin (ZOCOR) 20 MG tablet Take 1 tablet (20 mg total) by mouth daily in the evening. 90 tablet 4   sodium fluoride (PREVIDENT 5000 PLUS) 1.1 % CREA dental cream Use as directed 51 g 12   SYRINGE-NEEDLE, DISP, 3 ML 25G X 5/8" 3 ML MISC Use as directed to administer B12 shot every 30 days. 12 each 0   tacrolimus (PROTOPIC) 0.1 % ointment Apply to affected area on the skin twice a day 30 g 10   venlafaxine XR (EFFEXOR XR) 150 MG 24 hr capsule Take 2 capsules (300 mg total) by mouth daily in the morning with food. 180 capsule 3   No current facility-administered medications for this visit.    SUMMARY OF ONCOLOGIC HISTORY: Oncology History Overview Note  [employer] testing done 11-2015 on surgical path from 2014:    MS stable   TMB low  4 muts/mb   ATM [id number]   ERBB3 T389K   E2H2 rearrangement exon 9   PPP2R1A P179R   TP53 I195N    ER- APPROXIMATELY 25-35% STAINING IN NEOPLASTIC CELLS (INTERMEDIATE)  PR- APPROXIMATELY 25-35% STAINING IN NEOPLASTIC CELLS (STRONG)  Repeat biopsy 04/02/17: ER negative, Her 2 negative  Progressed on Doxil   Endometrial cancer (HCC)  06/20/2012 Pathology Results   Biopsy positive for papillary serous carcinoma   06/20/2012 Genetic Testing   [employer] testing done 11-2015 on surgical path from 2014:    MS stable   TMB low  4 muts/mb   ATM [id number]   ERBB3 T389K   E2H2 rearrangement exon 9   PPP2R1A P179R   TP53 I195N    06/20/2012 Initial Diagnosis   Patient presented to PCP with intermittent vaginal bleeding since ~ Oct 2013, endometrial biopsy 06-20-12 with complex endometrial hyperplasia with atypia   06/26/2012 Imaging   Thickened endometrial lining in a  postmenopausal patient experiencing vaginal bleeding. In the setting of post-menopausal bleeding, endometrial sampling is indicated to exclude carcinoma. No focal myometrial abnormalities are seen.  Normal left ovary and non-visualized right ovary   07/30/2012 Surgery   Dr. Clifton James performed robotic hysterectomy with bilateral salpingo-oophorectomy, bilateral pelvic lymph node dissection and periaortic lymph node dissection. Intraoperatively on frozen section, the patient was noted to have a large endometrial polyp with changes within the polyp consistent for high-grade malignancy, possibly papillary serous carcinoma. There is no obvious extrauterine disease noted.     07/30/2012 Pathology Results   [phone] SUPPLEMENTAL REPORT  THE ENDOMETRIAL CARCINOMA WAS ANALYZED FOR DNA MISMATCH REPAIR PROTEINS.  IMMUNOHISTOCHEMICALLY, THE NEOPLASM RETAINED NUCLEAR EXPRESSION OF 4 GENE PRODUCTS, MLH1, MSH2, MSH6, AND PMS2, INVOLVED IN DNA MISMATCH REPAIR.  POSITIVE AND NEGATIVE CONTROLS WORKED APPROPRIATELY.  PER REQUEST, AN ER AND PR ARE PERFORMED ON BLOCK 1I.  ER- APPROXIMATELY 25-35% STAINING IN NEOPLASTIC  CELLS (INTERMEDIATE)  PR- APPROXIMATELY 25-35% STAINING IN NEOPLASTIC CELLS (STRONG)  ER AND PR PREDOMINANTLY SHOW STAINING IN THE SEROUS COMPONENT.  DIAGNOSIS  1. UTERUS, CERVIX WITH BILATERAL FALLOPIAN TUBES AND OVARIES,  HYSTERECTOMY AND BILATERAL SALPINGO-OOPHORECTOMY:  HIGH GRADE/POORLY DIFFERENTIATED ENDOMETRIAL ADENOCARCINOMA WITH SOLID AND SEROUS PAPILLARY COMPONENTS.  HISTOPATHOLOGIC TYPE:    A VARIETY OF PATTERNS WERE PRESENT, ALL OF WHICH SHOULD BE CONSIDERED TO BE HIGH GRADE.  SEROUS PAPILLARY CARCINOMA WAS SEEN OCCURRING ADJACENT TO SOLID ENDOMETRIAL CARCINOMA WITH MARKED ANAPLASIA AND GIANT CELLS. SIZE: TUMOR MEASURED AT LEAST 4.8 CM IN GREATEST HORIZONTAL DIMENSION.  GRADE: POORLY DIFFERENTIATED OR HIGH GRADE. DEPTH OF INVASION: NO DEFINITE MYOMETRIAL INVOLVEMENT WAS SEEN.  THE  TUMOR APPEARED CONFINED TO THE POLYP AS WELL AS SURFACE ENDOMETRIUM. WHERE MYOMETRIAL THICKNESS NOT APPLICABLE. SEROSAL INVOLVEMENT:       NOT DEMONSTRATED.  ENDOCERVICAL INVOLVEMENT:  NOT DEMONSTRATED. RESECTION MARGINS:         FREE OF INVOLVEMENT. EXTRAUTERINE EXTENSION:    NOT DEMONSTRATED. ANGIOLYMPHATIC INVASION:   NOT DEFINITELY SEEN. TOTAL NODES EXAMINED:      22.    PELVIC NODES EXAMINED:  20.    PELVIC NODES INVOLVED:  0.    PARA-AORTIC NODES       2. EXAMINED:    PARA-AORTIC NODES       0. INVOLVED TNM STAGE:                 T1A N0 MX. AJCC STAGE GROUPING:       IA. FIGO STAGE:                IA.   08/26/2012 Imaging   No CT evidence for intra-abdominal or pelvic metastatic disease. Trace free pelvic fluid, presumably postoperative although the date of surgery is not documented in the electronic medical record.   08/26/2012 - 12/13/2012 Chemotherapy   She received 6 cycles of carbo/taxol    10/29/2012 - 11/27/2012 Radiation Therapy   May 27, June 5,  June 11, June 19, November 27, 2012: Proximal vagina 30 Gy in 5 fractions      01/17/2013 Imaging   1.  No evidence of recurrent or metastatic disease. 2.  No acute abnormality involving the abdomen or pelvis. 3.  Mild diffuse hepatic steatosis. 4.  Very small supraumbilical midline anterior abdominal wall hernia containing fat, unchanged   01/22/2014 Imaging   No evidence for metastatic or recurrent disease. 2. No bowel obstruction.  Normal appendix. 3. Small fat containing hernia, stable in appearance. 4. Status post hysterectomy and bilateral oophorectomy   02/19/2014 Imaging   No pulmonary lesions are identified. The abnormality on the chest x-ray is due to asymmetric left-sided sternoclavicular joint degenerative disease   10/12/2014 Imaging   New mild retroperitoneal lymphadenopathy in the left paraaortic region and proximal left common iliac chain, consistent with metastatic disease. No other sites of metastatic disease  identified within the abdomen or pelvis.       11/27/2014 - 02/11/2015 Chemotherapy   She received 4 cycles of carbo/taxol    03/01/2015 PET scan   Single hypermetabolic small retroperitoneal lymph node along the aorta. 2. No evidence of metastatic disease otherwise in the abdomen or pelvis. No evidence local recurrence. 3. Intensely hypermetabolic enlarged nodule adjacent to the RIGHT lobe of thyroid gland. This presumably represents the biopsied lesion in clinician report which was found to be benign thyroid tissue.   04/05/2015 - 05/13/2015 Radiation Therapy   She received 50.4 gray in 28 fractions  with simultaneous integrated boost to 56 gray   06/17/2015 Imaging   No acute process or evidence of metastatic disease in the abdomen or pelvis. Resolution of previously described retroperitoneal adenopathy. 2.  Possible constipation. 3. Atherosclerosis.   06/17/2015 Tumor Marker   Patient's tumor was tested for the following markers: CA125 Results of the tumor marker test revealed 33   08/12/2015 Tumor Marker   Patient's tumor was tested for the following markers: CA125 Results of the tumor marker test revealed 52   08/31/2015 PET scan   Development of right paratracheal hypermetabolic adenopathy, consistent with nodal metastasis. 2. The previously described isolated abdominal retroperitoneal hypermetabolic node has resolved. 3. Persistent hypermetabolic right thyroid nodule, per report previously biopsied. Correlate with those results. 4.  Possible constipation.   09/09/2015 -  Anti-estrogen oral therapy   She has been receiving alternative treatment between megace and tamoxifen   09/09/2015 Tumor Marker   Patient's tumor was tested for the following markers: CA125 Results of the tumor marker test revealed 37.8   09/20/2015 Procedure   Technically successful ultrasound-guided thyroid aspiration biopsy , dominant right nodule   09/20/2015 Pathology Results   THYROID, RIGHT, FINE NEEDLE  ASPIRATION (SPECIMEN 1 OF 1 COLLECTED 09/20/15): FINDINGS CONSISTENT WITH BENIGN THYROID NODULE (BETHESDA CATEGORY II).   10/28/2015 Tumor Marker   Patient's tumor was tested for the following markers: CA125 Results of the tumor marker test revealed 53.2   11/04/2015 Imaging   Stable benign right thyroid nodule   12/16/2015 Tumor Marker   Patient's tumor was tested for the following markers: CA125 Results of the tumor marker test revealed 38.1   03/22/2016 PET scan   Interval progression of hypermetabolic right paratracheal lymph node consistent with metastatic involvement. 2. Stable hypermetabolic right thyroid nodule. Reportedly this has been biopsied in the past.   03/23/2016 Tumor Marker   Patient's tumor was tested for the following markers: CA125 Results of the tumor marker test revealed 62.4   04/13/2016 - 06/01/2016 Radiation Therapy   She received 56 Gy to the chest in 28 fractions    05/09/2016 Imaging   No evidence of left lower extremity deep vein thrombosis. No evidence of a superficial thrombosis of the greater and lesser saphenous veins. Positive for thrombus noted in several varicosities of the calf. No evidence of Baker's cyst on the left.   05/17/2016 Tumor Marker   Patient's tumor was tested for the following markers: CA125 Results of the tumor marker test revealed 9   06/29/2016 Tumor Marker   Patient's tumor was tested for the following markers: CA125 Results of the tumor marker test revealed 5.9   08/04/2016 Tumor Marker   Patient's tumor was tested for the following markers: CA125 Results of the tumor marker test revealed 6.0   09/12/2016 PET scan   Complete metabolic response to therapy, with resolution of hypermetabolic mediastinal lymphadenopathy since prior exam. No residual or new metastatic disease identified. Stable hypermetabolic right thyroid lobe nodule, which was previously biopsied on 09/20/2015.   03/13/2017 PET scan   1. Solitary focus of recurrent  right paratracheal hypermetabolic activity, with a 1.0 cm right lower paratracheal node having a maximum SUV of 9.0. Appearance compatible with recurrent malignancy. 2. Continued hypermetabolic right thyroid nodule, previously biopsied, presumed benign -correlate with prior biopsy results. 3. Other imaging findings of potential clinical significance: Aortic Atherosclerosis (ICD10-I70.0). Mild cardiomegaly. Prominent stool throughout the colon favors constipation.   04/02/2017 Pathology Results   FINE NEEDLE ASPIRATION, ENDOSCOPIC, (EBUS) 4 R NODE (  SPECIMEN 1 OF 2 COLLECTED 04/02/17): MALIGNANT CELLS PRESENT, CONSISTENT WITH CARCINOMA. SEE COMMENT. COMMENT: THE MALIGNANT CELLS ARE POSITIVE FOR P53 AND NEGATIVE FOR ESTROGEN RECEPTOR AND TTF-1. THIS PROFILE IS NON-SPECIFIC, BUT THE P53 POSITIVE STAINING IS SUGGESTIVE OF GYNECOLOGIC PRIMARY.    04/11/2017 Procedure   Successful 8 French right internal jugular vein power port placement with its tip at the SVC/RA junction   04/12/2017 Imaging   Normal LV size with mild LV hypertrophy. EF 55-60%. Normal RV size and systolic function. Aortic valve sclerosis without significant stenosis.   04/18/2017 - 06/14/2017 Chemotherapy   The patient had chemotherapy with Doxil    07/10/2017 Imaging   - Left ventricle: The cavity size was normal. There was mild concentric hypertrophy. Systolic function was normal. The estimated ejection fraction was in the range of 55% to 60%. Wall motion was normal; there were no regional wall motion abnormalities. Doppler parameters are consistent with abnormal left ventricular relaxation (grade 1 diastolic dysfunction). - Aortic valve: Noncoronary cusp mobility was mildly restricted. - Mitral valve: There was mild regurgitation. - Left atrium: The atrium was mildly dilated. - Atrial septum: No defect or patent foramen ovale was identified.  Impressions:  - Compared to November 2018, global LV longitudinal strain remains  normal (has increased)   07/11/2017 PET scan   Increased size and hypermetabolic activity of 3.3 cm right thyroid lobe nodule. Thyroid carcinoma cannot be excluded. Recommend repeat ultrasound guided fine needle aspiration to exclude thyroid carcinoma.  New adjacent hypermetabolic 10 mm right supraclavicular lymph node, suspicious for lymph node metastasis.  Slight increase in size and hypermetabolic activity of solitary right paratracheal lymph node.  No evidence of abdominal or pelvic metastatic disease.   07/18/2017 Procedure   1. Technically successful ultrasound guided fine needle aspiration of indeterminate hypermetabolic right-sided thyroid nodule/mass. 2. Technically successful ultrasound-guided core needle biopsy of hypermetabolic right lower cervical lymph node.   07/18/2017 Pathology Results   THYROID, FINE NEEDLE ASPIRATION RIGHT (SPECIMEN 1 OF 1, COLLECTED ON 07/18/2017): ATYPIA OF UNDETERMINED SIGNIFICANCE OR FOLLICULAR LESION OF UNDETERMINED SIGNIFICANCE (BETHESDA CATEGORY III). SEE COMMENT. COMMENT: THE SPECIMEN CONSISTS OF SMALL AND MEDIUM SIZED GROUPS OF FOLLICULAR EPITHELIAL CELLS WITH MILD CYTOLOGIC ATYPIA INCLUDING NUCLEAR ENLARGEMENT AND HURTHLE CELL CHANGE. SOME GROUPS ARE ARRANGED AS MICROFOLLICLES. THERE IS MINIMAL BACKGROUND COLLOID. BASED ON THESE FEATURES, A FOLLICULAR LESION/NEOPLASM CAN NOT BE ENTIRELY RULED OUT. A SPECIMEN WILL BE SENT FOR AFIRMA TESTING.   07/19/2017 Pathology Results   Lymph node, needle/core biopsy - METASTATIC PAPILLARY SEROUS CARCINOMA. - SEE COMMENT. Microscopic Comment Dr. Valinda Hoar has reviewed the case and concurs with this interpretation   10/15/2017 PET scan   No new or progressive disease. No evidence of abdominal or pelvic metastatic disease.  Stable hypermetabolic right thyroid lobe nodule and adjacent right supraclavicular lymph node.  Decreased size and hypermetabolic activity of solitary right paratracheal lymph node.    01/23/2018 PET scan   1. Overall, no significant change from PET-CT of 3 months ago. There is persistent hypermetabolic activity at the right thoracic inlet and within a right paratracheal mediastinal node. The nodes have not significantly changed in size, although the metabolic activity has minimally increased over this interval. It is uncertain how much of the thoracic inlet metabolic activity is attributed to the thyroid nodule versus adjacent cervical lymph nodes, both previously biopsied. 2. No new hypermetabolic activity within the neck, chest, abdomen or pelvis. No evidence of local recurrence in the pelvis. 3. Stable probable radiation changes  in the right lung.   04/16/2018 PET scan   1. Interval increase in metabolic activity of the RIGHT supraclavicular node and RIGHT lower paratracheal mediastinal node with minimal change in size. Findings consistent with persistent and mildly progressed metastatic adenopathy. 2. New hypermetabolic small lymph node in the RIGHT lower paratracheal nodal station adjacent to the previously followed node. 3. No evidence of local recurrence in the pelvis or new disease elsewhere.   07/30/2018 PET scan   1. Mild interval progression of right supraclavicular, mediastinal, and right hilar hypermetabolic metastatic disease. There is a new hypermetabolic lymph node in the subcarinal station on today's study. 2. No hypermetabolic disease in the abdomen or pelvis to suggest recurrent/metastases. 3.  Aortic Atherosclerois (ICD10-170.0)     08/16/2018 -  Chemotherapy   The patient had Lenvima since 3/6 and pembrolizumab since 3/13 for chemotherapy treatment. Assunta Curtis was held temporarily due to infection and severe hypertension   10/31/2018 PET scan   Partial response to therapy, with decreased hypermetabolic lymphadenopathy in mediastinum and right hilar region.   No significant change in hypermetabolic lymphadenopathy in right inferior neck.   No new or  progressive metastatic disease identified. No evidence of recurrent or metastatic carcinoma in the pelvis or abdomen   02/04/2019 PET scan   1. Decrease in metabolic activity of RIGHT supraclavicular node and RIGHT paratracheal lymph nodes. 2. Persistent activity in the RIGHT thyroid nodule unchanged. 3. Diffuse activity through the esophagus is favored esophagitis. No change. 4. Scattered foci of intense metabolic activity associated the bowel without focal lesion on CT favored benign. 5. No evidence of peritoneal metastasis. 6. No evidence of metastatic adenopathy in the abdomen pelvis. 7. No evidence of local pelvic sidewall recurrence.   07/30/2019 PET scan   1. Mildly increased uptake within small nodes along the right sternocleidomastoid without change in size and associated with increased deltoid activity may reflect changes related to right arm injection or recent COVID vaccination, consider clinical correlation and attention on follow-up. 2. Persistent activity in the right thyroid nodule, unchanged from previous exams. 3. New activity in a juxta esophageal lymph node in the chest is suspicious though the node is unchanged with respect to size. Endoscopic correlation could potentially be helpful or close attention on follow-up. 4. Subtle increased FDG uptake along the left mainstem bronchus and adjacent to the aorta may represent a small lymph node. No discrete correlate is demonstrated on today's study.   10/22/2019 PET scan   1. Persistent FDG avid subcentimeter right supraclavicular and juxta esophageal lymph nodes. The degree of FDG uptake is similar to the previous exam. No new or progressive findings identified. 2. No findings of solid organ metastasis, skeletal metastasis or metastatic disease to the abdomen or pelvis. 3. FDG avid right lobe of thyroid gland nodule is again noted This nodule is not incidental and been worked up previously with 2 biopsies. Please refer to results from  the most recent biopsy dated 07/18/2017. (Ref: J Am Coll Radiol. 2015 Feb;12(2): 143-50). 4.  Aortic Atherosclerosis (ICD10-I70.0).   05/05/2020 PET scan   1. Progressive disease, as evidenced by increased hypermetabolism within right supraclavicular and mediastinal nodes. 2. No infradiaphragmatic hypermetabolic metastasis. 3.  Aortic Atherosclerosis (ICD10-I70.0). 4. Hypermetabolic right thyroid nodule has been detailed on prior exams and was biopsied on 07/18/2017   09/10/2020 PET scan   1. Interval improvement in previously demonstrated hypermetabolic mediastinal and right supraclavicular adenopathy. A single hypermetabolic left paraesophageal lymph node remains, improved from previous study. The  other nodes have resolved.  2. No progressive disease. 3. Diffuse esophageal hypermetabolic activity suggesting esophagitis. 4. Hypermetabolic right thyroid nodule has been previously biopsied.   03/18/2021 PET scan   1. No significant change in single residual FDG avid left paraesophageal lymph node. This has an SUV max of 7.03, compared with 6.4 previously. No new sites of disease identified. 2. Diffuse esophageal hypermetabolic activity suggestive of esophagitis. 3. FDG avid right lobe of thyroid gland nodule. This has been biopsied previously. (Ref: J Am Coll Radiol. 2015 Feb;12(2): 143-50).     09/22/2021 PET scan   1. Persistent mild diffuse FDG uptake in the esophagus likely chronic inflammation. 2. Resolution of the left paraesophageal lymph node seen on the prior study. 3. Stable hypermetabolic right thyroid nodule, previously biopsied. 4. No findings for recurrent abdominal/pelvic disease or metastatic disease.     02/23/2022 -  Chemotherapy   Patient is on Treatment Plan : UTERINE Lenvatinib (20) D1-21 + Pembrolizumab (200) D1 q21d     05/04/2022 PET scan   1. Status post hysterectomy without evidence of hypermetabolic local recurrence or hypermetabolic metastatic disease. 2.  Persistent diffuse long segment hypermetabolic esophageal activity, likely reflects chronic esophagitis. Consider further evaluation with endoscopy if not previously performed. 3.  Aortic Atherosclerosis (ICD10-I70.0).     11/09/2022 PET scan   NM PET Image Restage (PS) Skull Base to Thigh (F-18 FDG)  Result Date: 11/09/2022 CLINICAL DATA:  Subsequent treatment strategy for endometrial carcinoma status post hysterectomy. EXAM: NUCLEAR MEDICINE PET SKULL BASE TO THIGH TECHNIQUE: 7.6 mCi F-18 FDG was injected intravenously. Full-ring PET imaging was performed from the skull base to thigh after the radiotracer. CT data was obtained and used for attenuation correction and anatomic localization. Fasting blood glucose: 93 mg/dl COMPARISON:  16/03/9603 FINDINGS: Mediastinal blood pool activity: SUV max 2.44 Liver activity: SUV max NA NECK: No hypermetabolic lymph nodes in the neck. Hypermetabolic right lobe of thyroid gland nodule is again noted with SUV max of 10.86, previously 11.9. Incidental CT findings: None. CHEST: No hypermetabolic mediastinal or hilar nodes. No suspicious pulmonary nodules on the CT scan. Mild diffuse increased radiotracer uptake within the esophagus is again noted with SUV max 5.28. Previously this measured the same. No discrete mass identified. Incidental CT findings: Aortic atherosclerosis. ABDOMEN/PELVIS: No abnormal hypermetabolic activity within the liver, pancreas, adrenal glands, or spleen. No hypermetabolic lymph nodes in the abdomen or pelvis. Incidental CT findings: Status post hysterectomy. Aortic atherosclerotic calcifications. SKELETON: No focal hypermetabolic activity to suggest skeletal metastasis. Incidental CT findings: None. IMPRESSION: 1. No specific findings identified to suggest locally recurrent tumor or metastatic disease status post hysterectomy. 2. Persistent, mild, diffuse increased radiotracer uptake within the esophagus. These findings are favored to represent  chronic esophagitis. 3. No new or progressive disease identified. 4. Persistent hypermetabolic right lobe of thyroid gland nodule. This has been evaluated on previous imaging and biopsy. (Ref: J Am Coll Radiol. 2015 Feb;12(2): 143-50). 5. Aortic Atherosclerosis (ICD10-I70.0). Electronically Signed   By: Signa Kell M.D.   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 Lenvima was held temporarily due to infection and severe hypertension     PHYSICAL EXAMINATION: ECOG PERFORMANCE STATUS: 0 - Asymptomatic  There were no vitals filed for this visit. There were no  vitals filed for this visit.  GENERAL:alert, no distress and comfortable   LABORATORY DATA:  I have reviewed the data as listed    Component Value Date/Time   NA 138 02/01/2023 0901   NA 141 05/17/2017 0957   K 4.0 02/01/2023 0901   K 3.6 05/17/2017 0957   CL 103 02/01/2023 0901   CL 105 11/19/2012 0848   CO2 29 02/01/2023 0901   CO2 25 05/17/2017 0957   GLUCOSE 114 (H) 02/01/2023 0901   GLUCOSE 85 05/17/2017 0957   GLUCOSE 122 (H) 11/19/2012 0848   BUN 21 02/01/2023 0901   BUN 15.2 05/17/2017 0957   CREATININE 0.81 02/01/2023 0901   CREATININE 0.8 05/17/2017 0957   CALCIUM 9.8 02/01/2023 0901   CALCIUM 9.6 05/17/2017 0957   PROT 7.2 02/01/2023 0901   PROT 7.0 05/17/2017 0957   ALBUMIN 4.1 02/01/2023 0901   ALBUMIN 3.9 05/17/2017 0957   AST 19 02/01/2023 0901   AST 25 05/17/2017 0957   ALT 14 02/01/2023 0901   ALT 18 05/17/2017 0957   ALKPHOS 45 02/01/2023 0901   ALKPHOS 57 05/17/2017 0957   BILITOT 0.5 02/01/2023 0901   BILITOT 0.47 05/17/2017 0957   GFRNONAA >60 02/01/2023 0901   GFRAA >60 02/26/2020 0940   GFRAA >60 07/10/2019 1240    No results found for: "SPEP",  "UPEP"  Lab Results  Component Value Date   WBC 5.1 03/15/2023   NEUTROABS 3.2 03/15/2023   HGB 11.2 (L) 03/15/2023   HCT 34.3 (L) 03/15/2023   MCV 96.3 03/15/2023   PLT 190 03/15/2023      Chemistry      Component Value Date/Time   NA 138 02/01/2023 0901   NA 141 05/17/2017 0957   K 4.0 02/01/2023 0901   K 3.6 05/17/2017 0957   CL 103 02/01/2023 0901   CL 105 11/19/2012 0848   CO2 29 02/01/2023 0901   CO2 25 05/17/2017 0957   BUN 21 02/01/2023 0901   BUN 15.2 05/17/2017 0957   CREATININE 0.81 02/01/2023 0901   CREATININE 0.8 05/17/2017 0957   GLU 158 (H) 01/14/2015 1035      Component Value Date/Time   CALCIUM 9.8 02/01/2023 0901   CALCIUM 9.6 05/17/2017 0957   ALKPHOS 45 02/01/2023 0901   ALKPHOS 57 05/17/2017 0957   AST 19 02/01/2023 0901   AST 25 05/17/2017 0957   ALT 14 02/01/2023 0901   ALT 18 05/17/2017 0957   BILITOT 0.5 02/01/2023 0901   BILITOT 0.47 05/17/2017 0957

## 2023-03-15 NOTE — Assessment & Plan Note (Signed)
Her blood pressure is stable She will continue Lenvima and her current blood pressure treatment I refilled her prescription of HCTz today

## 2023-03-15 NOTE — Assessment & Plan Note (Signed)
Her last PET CT scan is normal Previously noted left paraesophageal lymphadenopathy that was previously treated with radiation therapy has normalized since April of 2023 I recommend we continue surveillance imaging study every 6 months and if she has no signs of disease progression in April 2025, we can consider discontinuation of systemic treatment So far, she tolerated treatment well without any side effects We discussed plan of care.  Her next PET/CT imaging will be in December

## 2023-03-20 ENCOUNTER — Other Ambulatory Visit: Payer: Self-pay

## 2023-03-20 ENCOUNTER — Other Ambulatory Visit (HOSPITAL_COMMUNITY): Payer: Self-pay

## 2023-03-20 NOTE — Progress Notes (Signed)
Specialty Pharmacy Ongoing Clinical Assessment Note  Diane Lozano is a 67 y.o. female who is being followed by the specialty pharmacy service for RxSp Oncology   Patient's specialty medication(s) reviewed today: Lenvatinib Mesylate   Missed doses in the last 4 weeks: 2   Patient/Caregiver did not have any additional questions or concerns.   Therapeutic benefit summary: Patient is achieving benefit   Adverse events/side effects summary: No adverse events/side effects   Patient's therapy is appropriate to: Continue    Goals Addressed             This Visit's Progress    Slow Disease Progression       Patient is on track. Patient will maintain adherence. Per provider notes from 10/10 last PET scan was normal.          Follow up:  6 months  Otto Herb Specialty Pharmacist

## 2023-03-20 NOTE — Progress Notes (Signed)
Specialty Pharmacy Refill Coordination Note  Diane Lozano is a 67 y.o. female contacted today regarding refills of specialty medication(s) Lenvatinib Mesylate   Patient requested Delivery   Delivery date: 03/28/23   Verified address: [address]  [place] [address]   Medication will be filled on 03/27/23. Patient requested we ship buspirone as well.

## 2023-03-22 DIAGNOSIS — K219 Gastro-esophageal reflux disease without esophagitis: Secondary | ICD-10-CM | POA: Diagnosis not present

## 2023-04-03 ENCOUNTER — Other Ambulatory Visit: Payer: Self-pay

## 2023-04-04 ENCOUNTER — Other Ambulatory Visit (HOSPITAL_COMMUNITY): Payer: Self-pay

## 2023-04-04 ENCOUNTER — Other Ambulatory Visit: Payer: Self-pay

## 2023-04-04 DIAGNOSIS — I5189 Other ill-defined heart diseases: Secondary | ICD-10-CM | POA: Diagnosis not present

## 2023-04-04 DIAGNOSIS — M8580 Other specified disorders of bone density and structure, unspecified site: Secondary | ICD-10-CM | POA: Diagnosis not present

## 2023-04-04 DIAGNOSIS — E785 Hyperlipidemia, unspecified: Secondary | ICD-10-CM | POA: Diagnosis not present

## 2023-04-04 DIAGNOSIS — I358 Other nonrheumatic aortic valve disorders: Secondary | ICD-10-CM | POA: Diagnosis not present

## 2023-04-04 DIAGNOSIS — I10 Essential (primary) hypertension: Secondary | ICD-10-CM | POA: Diagnosis not present

## 2023-04-04 DIAGNOSIS — C541 Malignant neoplasm of endometrium: Secondary | ICD-10-CM | POA: Diagnosis not present

## 2023-04-04 DIAGNOSIS — E041 Nontoxic single thyroid nodule: Secondary | ICD-10-CM | POA: Diagnosis not present

## 2023-04-04 DIAGNOSIS — I700 Atherosclerosis of aorta: Secondary | ICD-10-CM | POA: Diagnosis not present

## 2023-04-04 DIAGNOSIS — D61810 Antineoplastic chemotherapy induced pancytopenia: Secondary | ICD-10-CM | POA: Diagnosis not present

## 2023-04-04 DIAGNOSIS — K219 Gastro-esophageal reflux disease without esophagitis: Secondary | ICD-10-CM | POA: Diagnosis not present

## 2023-04-04 MED ORDER — LEVOTHYROXINE SODIUM 137 MCG PO TABS
137.0000 ug | ORAL_TABLET | Freq: Every morning | ORAL | 3 refills | Status: DC
Start: 2023-04-04 — End: 2023-12-27
  Filled 2023-04-04 (×2): qty 90, 90d supply, fill #0
  Filled 2023-07-02: qty 90, 90d supply, fill #1
  Filled 2023-09-26: qty 90, 90d supply, fill #2

## 2023-04-17 ENCOUNTER — Other Ambulatory Visit (HOSPITAL_COMMUNITY): Payer: Self-pay | Admitting: Pharmacy Technician

## 2023-04-17 ENCOUNTER — Other Ambulatory Visit (HOSPITAL_COMMUNITY): Payer: Self-pay

## 2023-04-17 NOTE — Progress Notes (Signed)
Specialty Pharmacy Refill Coordination Note  Diane Lozano is a 67 y.o. female contacted today regarding refills of specialty medication(s) Lenvatinib Mesylate   Patient requested Delivery   Delivery date: 04/26/23   Verified address: [address]  [place]   Medication will be filled on 04/25/23.

## 2023-04-26 ENCOUNTER — Encounter: Payer: Self-pay | Admitting: Hematology and Oncology

## 2023-04-26 ENCOUNTER — Inpatient Hospital Stay: Payer: 59 | Attending: Hematology and Oncology

## 2023-04-26 ENCOUNTER — Inpatient Hospital Stay: Payer: 59

## 2023-04-26 ENCOUNTER — Inpatient Hospital Stay (HOSPITAL_BASED_OUTPATIENT_CLINIC_OR_DEPARTMENT_OTHER): Payer: 59 | Admitting: Hematology and Oncology

## 2023-04-26 VITALS — BP 128/73 | HR 66 | Resp 18 | Ht 64.0 in | Wt 151.0 lb

## 2023-04-26 DIAGNOSIS — Z90710 Acquired absence of both cervix and uterus: Secondary | ICD-10-CM | POA: Diagnosis not present

## 2023-04-26 DIAGNOSIS — Z79620 Long term (current) use of immunosuppressive biologic: Secondary | ICD-10-CM | POA: Diagnosis not present

## 2023-04-26 DIAGNOSIS — T451X5A Adverse effect of antineoplastic and immunosuppressive drugs, initial encounter: Secondary | ICD-10-CM

## 2023-04-26 DIAGNOSIS — C541 Malignant neoplasm of endometrium: Secondary | ICD-10-CM

## 2023-04-26 DIAGNOSIS — E039 Hypothyroidism, unspecified: Secondary | ICD-10-CM

## 2023-04-26 DIAGNOSIS — E538 Deficiency of other specified B group vitamins: Secondary | ICD-10-CM

## 2023-04-26 DIAGNOSIS — C770 Secondary and unspecified malignant neoplasm of lymph nodes of head, face and neck: Secondary | ICD-10-CM | POA: Insufficient documentation

## 2023-04-26 DIAGNOSIS — D6481 Anemia due to antineoplastic chemotherapy: Secondary | ICD-10-CM | POA: Insufficient documentation

## 2023-04-26 DIAGNOSIS — Z5112 Encounter for antineoplastic immunotherapy: Secondary | ICD-10-CM | POA: Insufficient documentation

## 2023-04-26 DIAGNOSIS — I10 Essential (primary) hypertension: Secondary | ICD-10-CM

## 2023-04-26 DIAGNOSIS — Z95828 Presence of other vascular implants and grafts: Secondary | ICD-10-CM

## 2023-04-26 DIAGNOSIS — D61818 Other pancytopenia: Secondary | ICD-10-CM | POA: Insufficient documentation

## 2023-04-26 DIAGNOSIS — Z90722 Acquired absence of ovaries, bilateral: Secondary | ICD-10-CM | POA: Diagnosis not present

## 2023-04-26 LAB — CMP (CANCER CENTER ONLY)
ALT: 15 U/L (ref 0–44)
AST: 22 U/L (ref 15–41)
Albumin: 4.1 g/dL (ref 3.5–5.0)
Alkaline Phosphatase: 50 U/L (ref 38–126)
Anion gap: 10 (ref 5–15)
BUN: 22 mg/dL (ref 8–23)
CO2: 25 mmol/L (ref 22–32)
Calcium: 9.8 mg/dL (ref 8.9–10.3)
Chloride: 101 mmol/L (ref 98–111)
Creatinine: 0.89 mg/dL (ref 0.44–1.00)
GFR, Estimated: >60 mL/min (ref >60)
Glucose, Bld: 109 mg/dL — ABNORMAL HIGH (ref 70–99)
Potassium: 4.2 mmol/L (ref 3.5–5.1)
Sodium: 136 mmol/L (ref 135–145)
Total Bilirubin: 0.5 mg/dL (ref <1.2)
Total Protein: 7.2 g/dL (ref 6.5–8.1)

## 2023-04-26 LAB — CBC WITH DIFFERENTIAL (CANCER CENTER ONLY)
Abs Immature Granulocytes: 0.01 10*3/uL (ref 0.00–0.07)
Basophils Absolute: 0.1 10*3/uL (ref 0.0–0.1)
Basophils Relative: 1 %
Eosinophils Absolute: 0.3 10*3/uL (ref 0.0–0.5)
Eosinophils Relative: 6 %
HCT: 35.5 % — ABNORMAL LOW (ref 36.0–46.0)
Hemoglobin: 11.2 g/dL — ABNORMAL LOW (ref 12.0–15.0)
Immature Granulocytes: 0 %
Lymphocytes Relative: 24 %
Lymphs Abs: 1.2 10*3/uL (ref 0.7–4.0)
MCH: 31.5 pg (ref 26.0–34.0)
MCHC: 31.5 g/dL (ref 30.0–36.0)
MCV: 100 fL (ref 80.0–100.0)
Monocytes Absolute: 0.4 10*3/uL (ref 0.1–1.0)
Monocytes Relative: 8 %
Neutro Abs: 2.9 10*3/uL (ref 1.7–7.7)
Neutrophils Relative %: 61 %
Platelet Count: 230 10*3/uL (ref 150–400)
RBC: 3.55 MIL/uL — ABNORMAL LOW (ref 3.87–5.11)
RDW: 13.4 % (ref 11.5–15.5)
WBC Count: 4.8 10*3/uL (ref 4.0–10.5)
nRBC: 0 % (ref 0.0–0.2)

## 2023-04-26 LAB — TOTAL PROTEIN, URINE DIPSTICK: Protein, ur: NEGATIVE mg/dL

## 2023-04-26 LAB — TSH: TSH: 1.834 u[IU]/mL (ref 0.350–4.500)

## 2023-04-26 MED ORDER — SODIUM CHLORIDE 0.9% FLUSH
10.0000 mL | Freq: Once | INTRAVENOUS | Status: AC
  Administered 2023-04-26: 10 mL

## 2023-04-26 MED ORDER — SODIUM CHLORIDE 0.9 % IV SOLN: Freq: Once | INTRAVENOUS | Status: AC

## 2023-04-26 MED ORDER — SODIUM CHLORIDE 0.9 % IV SOLN
400.0000 mg | Freq: Once | INTRAVENOUS | Status: AC
  Administered 2023-04-26: 400 mg via INTRAVENOUS
  Filled 2023-04-26: qty 16

## 2023-04-26 MED ORDER — HEPARIN SOD (PORK) LOCK FLUSH 100 UNIT/ML IV SOLN
500.0000 [IU] | Freq: Once | INTRAVENOUS | Status: AC | PRN
  Administered 2023-04-26: 500 [IU]

## 2023-04-26 MED ORDER — SODIUM CHLORIDE 0.9% FLUSH
10.0000 mL | INTRAVENOUS | Status: DC | PRN
  Administered 2023-04-26: 10 mL

## 2023-04-26 NOTE — Assessment & Plan Note (Signed)
She had intermittent pancytopenia She will receive vitamin B12 injection monthly Plan to recheck next year

## 2023-04-26 NOTE — Assessment & Plan Note (Signed)
Her last PET CT scan is normal Previously noted left paraesophageal lymphadenopathy that was previously treated with radiation therapy has normalized since April of 2023 I recommend we continue surveillance imaging study every 6 months and if she has no signs of disease progression in April 2025, we can consider discontinuation of systemic treatment So far, she tolerated treatment well without any side effects We discussed plan of care.  Her next PET/CT imaging will be in December

## 2023-04-26 NOTE — Progress Notes (Signed)
[facility] OFFICE PROGRESS NOTE  Patient Care Team: Sigmund Hazel, MD as PCP - General (Family Medicine) Adrian Prince, MD as Consulting Physician (Endocrinology)  ASSESSMENT & PLAN:  Endometrial cancer [facility]) Her last PET CT scan is normal Previously noted left paraesophageal lymphadenopathy that was previously treated with radiation therapy has normalized since April of 2023 I recommend we continue surveillance imaging study every 6 months and if she has no signs of disease progression in April 2025, we can consider discontinuation of systemic treatment So far, she tolerated treatment well without any side effects We discussed plan of care.  Her next PET/CT imaging will be in December   Anemia due to antineoplastic chemotherapy She had intermittent pancytopenia She will receive vitamin B12 injection monthly Plan to recheck next year  Orders Placed This Encounter  Procedures   NM PET Image Restage (PS) Skull Base to Thigh (F-18 FDG)    Standing Status:   Future    Standing Expiration Date:   04/25/2024    Order Specific Question:   If indicated for the ordered procedure, I authorize the administration of a radiopharmaceutical per Radiology protocol    Answer:   Yes    Order Specific Question:   Preferred imaging location?    Answer:   [facility]    Order Specific Question:   Radiology Contrast Protocol - do NOT remove file path    Answer:   [email or web address]   Vitamin B12    Standing Status:   Future    Standing Expiration Date:   04/25/2024    All questions were answered. The patient knows to call the clinic with any problems, questions or concerns. The total time spent in the appointment was 20 minutes encounter with patients including review of chart and various tests results, discussions about plan of care and coordination of care plan   Artis Delay, MD 04/26/2023 9:11 AM  INTERVAL HISTORY: Please see  below for problem oriented charting. she returns for chemotherapy follow-up She have no new signs or symptoms of disease Tolerated treatment well Blood pressure is excellent She have no problems with medication refills We discussed timing of next imaging  REVIEW OF SYSTEMS:   Constitutional: Denies fevers, chills or abnormal weight loss Eyes: Denies blurriness of vision Ears, nose, mouth, throat, and face: Denies mucositis or sore throat Respiratory: Denies cough, dyspnea or wheezes Cardiovascular: Denies palpitation, chest discomfort or lower extremity swelling Gastrointestinal:  Denies nausea, heartburn or change in bowel habits Skin: Denies abnormal skin rashes Lymphatics: Denies new lymphadenopathy or easy bruising Neurological:Denies numbness, tingling or new weaknesses Behavioral/Psych: Mood is stable, no new changes  All other systems were reviewed with the patient and are negative.  I have reviewed the past medical history, past surgical history, social history and family history with the patient and they are unchanged from previous note.  ALLERGIES:  is allergic to lenvima [lenvatinib].  MEDICATIONS:  Current Outpatient Medications  Medication Sig Dispense Refill   amLODipine (NORVASC) 10 MG tablet Take 1 tablet (10 mg total) by mouth daily.**DECREASE SIMVASTATIN TO 20 MG DAILY WHILE ON AMLODIPINE PER MD** 90 tablet 1   busPIRone (BUSPAR) 10 MG tablet Take 1 tablet (10 mg total) by mouth 2 (two) times daily. 180 tablet 3   Cholecalciferol (VITAMIN D3) 2000 units TABS Take 2,000 Units by mouth daily.      cyanocobalamin (DODEX) 1000 MCG/ML injection Inject 1 mL into the muscle every 30 days. 1 mL 11   hydrochlorothiazide (  MICROZIDE) 12.5 MG capsule Take 1 capsule (12.5 mg total) by mouth daily. 90 capsule 3   lenvatinib 10 mg daily dose (LENVIMA) capsule TAKE 1 CAPSULE (10 MG TOTAL) BY MOUTH DAILY. 30 each 11   levothyroxine (SYNTHROID) 100 MCG tablet Take 1 tablet (100 mcg  total) by mouth daily before breakfast. 60 tablet 1   levothyroxine (SYNTHROID) 137 MCG tablet Take 1 tablet (137 mcg total) by mouth in the morning on an empty stomach for 90 days. 90 tablet 3   losartan (COZAAR) 25 MG tablet Take 1 tablet (25 mg total) by mouth daily. 90 tablet 3   metoprolol tartrate (LOPRESSOR) 25 MG tablet Take 1 tablet (25 mg total) by mouth 2 (two) times daily. 180 tablet 3   ondansetron (ZOFRAN) 8 MG tablet Take 1 tablet (8 mg total) by mouth every 8 (eight) hours as needed for nausea. 30 tablet 3   pantoprazole (PROTONIX) 40 MG tablet Take 1 tablet (40 mg total) by mouth daily. 90 tablet 3   simvastatin (ZOCOR) 20 MG tablet Take 1 tablet (20 mg total) by mouth daily in the evening. 90 tablet 4   sodium fluoride (PREVIDENT 5000 PLUS) 1.1 % CREA dental cream Use as directed 51 g 12   SYRINGE-NEEDLE, DISP, 3 ML 25G X 5/8" 3 ML MISC Use as directed to administer B12 shot every 30 days. 12 each 0   tacrolimus (PROTOPIC) 0.1 % ointment Apply to affected area on the skin twice a day 30 g 10   venlafaxine XR (EFFEXOR XR) 150 MG 24 hr capsule Take 2 capsules (300 mg total) by mouth daily in the morning with food. 180 capsule 3   No current facility-administered medications for this visit.   Facility-Administered Medications Ordered in Other Visits  Medication Dose Route Frequency Provider Last Rate Last Admin   0.9 %  sodium chloride infusion   Intravenous Once Bertis Ruddy, Alvis Edgell, MD       heparin lock flush 100 unit/mL  500 Units Intracatheter Once PRN Bertis Ruddy, Gaege Sangalang, MD       pembrolizumab (KEYTRUDA) 400 mg in sodium chloride 0.9 % 50 mL chemo infusion  400 mg Intravenous Once Bertis Ruddy, Simone Tuckey, MD       sodium chloride flush (NS) 0.9 % injection 10 mL  10 mL Intracatheter PRN Artis Delay, MD        SUMMARY OF ONCOLOGIC HISTORY: Oncology History Overview Note  [employer] testing done 11-2015 on surgical path from 2014:    MS stable   TMB low  4 muts/mb   ATM [id number]   ERBB3 T389K    E2H2 rearrangement exon 9   PPP2R1A P179R   TP53 I195N    ER- APPROXIMATELY 25-35% STAINING IN NEOPLASTIC CELLS (INTERMEDIATE)  PR- APPROXIMATELY 25-35% STAINING IN NEOPLASTIC CELLS (STRONG)  Repeat biopsy 04/02/17: ER negative, Her 2 negative  Progressed on Doxil   Endometrial cancer (HCC)  06/20/2012 Pathology Results   Biopsy positive for papillary serous carcinoma   06/20/2012 Genetic Testing   Foundation One testing done 11-2015 on surgical path from 2014:    MS stable   TMB low  4 muts/mb   ATM [id number]   ERBB3 T389K   E2H2 rearrangement exon 9   PPP2R1A P179R   TP53 I195N    06/20/2012 Initial Diagnosis   Patient presented to PCP with intermittent vaginal bleeding since ~ Oct 2013, endometrial biopsy 06-20-12 with complex endometrial hyperplasia with atypia   06/26/2012 Imaging   Thickened endometrial lining in  a postmenopausal patient experiencing vaginal bleeding. In the setting of post-menopausal bleeding, endometrial sampling is indicated to exclude carcinoma. No focal myometrial abnormalities are seen.  Normal left ovary and non-visualized right ovary   07/30/2012 Surgery   Dr. Clifton James performed robotic hysterectomy with bilateral salpingo-oophorectomy, bilateral pelvic lymph node dissection and periaortic lymph node dissection. Intraoperatively on frozen section, the patient was noted to have a large endometrial polyp with changes within the polyp consistent for high-grade malignancy, possibly papillary serous carcinoma. There is no obvious extrauterine disease noted.     07/30/2012 Pathology Results   [phone] SUPPLEMENTAL REPORT  THE ENDOMETRIAL CARCINOMA WAS ANALYZED FOR DNA MISMATCH REPAIR PROTEINS.  IMMUNOHISTOCHEMICALLY, THE NEOPLASM RETAINED NUCLEAR EXPRESSION OF 4 GENE PRODUCTS, MLH1, MSH2, MSH6, AND PMS2, INVOLVED IN DNA MISMATCH REPAIR.  POSITIVE AND NEGATIVE CONTROLS WORKED APPROPRIATELY.  PER REQUEST, AN ER AND PR ARE PERFORMED ON BLOCK 1I.  ER-  APPROXIMATELY 25-35% STAINING IN NEOPLASTIC CELLS (INTERMEDIATE)  PR- APPROXIMATELY 25-35% STAINING IN NEOPLASTIC CELLS (STRONG)  ER AND PR PREDOMINANTLY SHOW STAINING IN THE SEROUS COMPONENT.  DIAGNOSIS  1. UTERUS, CERVIX WITH BILATERAL FALLOPIAN TUBES AND OVARIES,  HYSTERECTOMY AND BILATERAL SALPINGO-OOPHORECTOMY:  HIGH GRADE/POORLY DIFFERENTIATED ENDOMETRIAL ADENOCARCINOMA WITH SOLID AND SEROUS PAPILLARY COMPONENTS.  HISTOPATHOLOGIC TYPE:    A VARIETY OF PATTERNS WERE PRESENT, ALL OF WHICH SHOULD BE CONSIDERED TO BE HIGH GRADE.  SEROUS PAPILLARY CARCINOMA WAS SEEN OCCURRING ADJACENT TO SOLID ENDOMETRIAL CARCINOMA WITH MARKED ANAPLASIA AND GIANT CELLS. SIZE: TUMOR MEASURED AT LEAST 4.8 CM IN GREATEST HORIZONTAL DIMENSION.  GRADE: POORLY DIFFERENTIATED OR HIGH GRADE. DEPTH OF INVASION: NO DEFINITE MYOMETRIAL INVOLVEMENT WAS SEEN.  THE TUMOR APPEARED CONFINED TO THE POLYP AS WELL AS SURFACE ENDOMETRIUM. WHERE MYOMETRIAL THICKNESS NOT APPLICABLE. SEROSAL INVOLVEMENT:       NOT DEMONSTRATED.  ENDOCERVICAL INVOLVEMENT:  NOT DEMONSTRATED. RESECTION MARGINS:         FREE OF INVOLVEMENT. EXTRAUTERINE EXTENSION:    NOT DEMONSTRATED. ANGIOLYMPHATIC INVASION:   NOT DEFINITELY SEEN. TOTAL NODES EXAMINED:      22.    PELVIC NODES EXAMINED:  20.    PELVIC NODES INVOLVED:  0.    PARA-AORTIC NODES       2. EXAMINED:    PARA-AORTIC NODES       0. INVOLVED TNM STAGE:                 T1A N0 MX. AJCC STAGE GROUPING:       IA. FIGO STAGE:                IA.   08/26/2012 Imaging   No CT evidence for intra-abdominal or pelvic metastatic disease. Trace free pelvic fluid, presumably postoperative although the date of surgery is not documented in the electronic medical record.   08/26/2012 - 12/13/2012 Chemotherapy   She received 6 cycles of carbo/taxol    10/29/2012 - 11/27/2012 Radiation Therapy   May 27, June 5,  June 11, June 19, November 27, 2012: Proximal vagina 30 Gy in 5 fractions      01/17/2013  Imaging   1.  No evidence of recurrent or metastatic disease. 2.  No acute abnormality involving the abdomen or pelvis. 3.  Mild diffuse hepatic steatosis. 4.  Very small supraumbilical midline anterior abdominal wall hernia containing fat, unchanged   01/22/2014 Imaging   No evidence for metastatic or recurrent disease. 2. No bowel obstruction.  Normal appendix. 3. Small fat containing hernia, stable in appearance. 4. Status post hysterectomy and bilateral  oophorectomy   02/19/2014 Imaging   No pulmonary lesions are identified. The abnormality on the chest x-ray is due to asymmetric left-sided sternoclavicular joint degenerative disease   10/12/2014 Imaging   New mild retroperitoneal lymphadenopathy in the left paraaortic region and proximal left common iliac chain, consistent with metastatic disease. No other sites of metastatic disease identified within the abdomen or pelvis.       11/27/2014 - 02/11/2015 Chemotherapy   She received 4 cycles of carbo/taxol    03/01/2015 PET scan   Single hypermetabolic small retroperitoneal lymph node along the aorta. 2. No evidence of metastatic disease otherwise in the abdomen or pelvis. No evidence local recurrence. 3. Intensely hypermetabolic enlarged nodule adjacent to the RIGHT lobe of thyroid gland. This presumably represents the biopsied lesion in clinician report which was found to be benign thyroid tissue.   04/05/2015 - 05/13/2015 Radiation Therapy   She received 50.4 gray in 28 fractions with simultaneous integrated boost to 56 gray   06/17/2015 Imaging   No acute process or evidence of metastatic disease in the abdomen or pelvis. Resolution of previously described retroperitoneal adenopathy. 2.  Possible constipation. 3. Atherosclerosis.   06/17/2015 Tumor Marker   Patient's tumor was tested for the following markers: CA125 Results of the tumor marker test revealed 33   08/12/2015 Tumor Marker   Patient's tumor was tested for the following markers:  CA125 Results of the tumor marker test revealed 52   08/31/2015 PET scan   Development of right paratracheal hypermetabolic adenopathy, consistent with nodal metastasis. 2. The previously described isolated abdominal retroperitoneal hypermetabolic node has resolved. 3. Persistent hypermetabolic right thyroid nodule, per report previously biopsied. Correlate with those results. 4.  Possible constipation.   09/09/2015 -  Anti-estrogen oral therapy   She has been receiving alternative treatment between megace and tamoxifen   09/09/2015 Tumor Marker   Patient's tumor was tested for the following markers: CA125 Results of the tumor marker test revealed 37.8   09/20/2015 Procedure   Technically successful ultrasound-guided thyroid aspiration biopsy , dominant right nodule   09/20/2015 Pathology Results   THYROID, RIGHT, FINE NEEDLE ASPIRATION (SPECIMEN 1 OF 1 COLLECTED 09/20/15): FINDINGS CONSISTENT WITH BENIGN THYROID NODULE (BETHESDA CATEGORY II).   10/28/2015 Tumor Marker   Patient's tumor was tested for the following markers: CA125 Results of the tumor marker test revealed 53.2   11/04/2015 Imaging   Stable benign right thyroid nodule   12/16/2015 Tumor Marker   Patient's tumor was tested for the following markers: CA125 Results of the tumor marker test revealed 38.1   03/22/2016 PET scan   Interval progression of hypermetabolic right paratracheal lymph node consistent with metastatic involvement. 2. Stable hypermetabolic right thyroid nodule. Reportedly this has been biopsied in the past.   03/23/2016 Tumor Marker   Patient's tumor was tested for the following markers: CA125 Results of the tumor marker test revealed 62.4   04/13/2016 - 06/01/2016 Radiation Therapy   She received 56 Gy to the chest in 28 fractions    05/09/2016 Imaging   No evidence of left lower extremity deep vein thrombosis. No evidence of a superficial thrombosis of the greater and lesser saphenous veins. Positive for  thrombus noted in several varicosities of the calf. No evidence of Baker's cyst on the left.   05/17/2016 Tumor Marker   Patient's tumor was tested for the following markers: CA125 Results of the tumor marker test revealed 9   06/29/2016 Tumor Marker   Patient's tumor was tested for  the following markers: CA125 Results of the tumor marker test revealed 5.9   08/04/2016 Tumor Marker   Patient's tumor was tested for the following markers: CA125 Results of the tumor marker test revealed 6.0   09/12/2016 PET scan   Complete metabolic response to therapy, with resolution of hypermetabolic mediastinal lymphadenopathy since prior exam. No residual or new metastatic disease identified. Stable hypermetabolic right thyroid lobe nodule, which was previously biopsied on 09/20/2015.   03/13/2017 PET scan   1. Solitary focus of recurrent right paratracheal hypermetabolic activity, with a 1.0 cm right lower paratracheal node having a maximum SUV of 9.0. Appearance compatible with recurrent malignancy. 2. Continued hypermetabolic right thyroid nodule, previously biopsied, presumed benign -correlate with prior biopsy results. 3. Other imaging findings of potential clinical significance: Aortic Atherosclerosis (ICD10-I70.0). Mild cardiomegaly. Prominent stool throughout the colon favors constipation.   04/02/2017 Pathology Results   FINE NEEDLE ASPIRATION, ENDOSCOPIC, (EBUS) 4 R NODE (SPECIMEN 1 OF 2 COLLECTED 04/02/17): MALIGNANT CELLS PRESENT, CONSISTENT WITH CARCINOMA. SEE COMMENT. COMMENT: THE MALIGNANT CELLS ARE POSITIVE FOR P53 AND NEGATIVE FOR ESTROGEN RECEPTOR AND TTF-1. THIS PROFILE IS NON-SPECIFIC, BUT THE P53 POSITIVE STAINING IS SUGGESTIVE OF GYNECOLOGIC PRIMARY.    04/11/2017 Procedure   Successful 8 French right internal jugular vein power port placement with its tip at the SVC/RA junction   04/12/2017 Imaging   Normal LV size with mild LV hypertrophy. EF 55-60%. Normal RV size and systolic  function. Aortic valve sclerosis without significant stenosis.   04/18/2017 - 06/14/2017 Chemotherapy   The patient had chemotherapy with Doxil    07/10/2017 Imaging   - Left ventricle: The cavity size was normal. There was mild concentric hypertrophy. Systolic function was normal. The estimated ejection fraction was in the range of 55% to 60%. Wall motion was normal; there were no regional wall motion abnormalities. Doppler parameters are consistent with abnormal left ventricular relaxation (grade 1 diastolic dysfunction). - Aortic valve: Noncoronary cusp mobility was mildly restricted. - Mitral valve: There was mild regurgitation. - Left atrium: The atrium was mildly dilated. - Atrial septum: No defect or patent foramen ovale was identified.  Impressions:  - Compared to November 2018, global LV longitudinal strain remains normal (has increased)   07/11/2017 PET scan   Increased size and hypermetabolic activity of 3.3 cm right thyroid lobe nodule. Thyroid carcinoma cannot be excluded. Recommend repeat ultrasound guided fine needle aspiration to exclude thyroid carcinoma.  New adjacent hypermetabolic 10 mm right supraclavicular lymph node, suspicious for lymph node metastasis.  Slight increase in size and hypermetabolic activity of solitary right paratracheal lymph node.  No evidence of abdominal or pelvic metastatic disease.   07/18/2017 Procedure   1. Technically successful ultrasound guided fine needle aspiration of indeterminate hypermetabolic right-sided thyroid nodule/mass. 2. Technically successful ultrasound-guided core needle biopsy of hypermetabolic right lower cervical lymph node.   07/18/2017 Pathology Results   THYROID, FINE NEEDLE ASPIRATION RIGHT (SPECIMEN 1 OF 1, COLLECTED ON 07/18/2017): ATYPIA OF UNDETERMINED SIGNIFICANCE OR FOLLICULAR LESION OF UNDETERMINED SIGNIFICANCE (BETHESDA CATEGORY III). SEE COMMENT. COMMENT: THE SPECIMEN CONSISTS OF SMALL AND MEDIUM SIZED GROUPS  OF FOLLICULAR EPITHELIAL CELLS WITH MILD CYTOLOGIC ATYPIA INCLUDING NUCLEAR ENLARGEMENT AND HURTHLE CELL CHANGE. SOME GROUPS ARE ARRANGED AS MICROFOLLICLES. THERE IS MINIMAL BACKGROUND COLLOID. BASED ON THESE FEATURES, A FOLLICULAR LESION/NEOPLASM CAN NOT BE ENTIRELY RULED OUT. A SPECIMEN WILL BE SENT FOR AFIRMA TESTING.   07/19/2017 Pathology Results   Lymph node, needle/core biopsy - METASTATIC PAPILLARY SEROUS CARCINOMA. - SEE COMMENT. Microscopic  Comment Dr. Valinda Hoar has reviewed the case and concurs with this interpretation   10/15/2017 PET scan   No new or progressive disease. No evidence of abdominal or pelvic metastatic disease.  Stable hypermetabolic right thyroid lobe nodule and adjacent right supraclavicular lymph node.  Decreased size and hypermetabolic activity of solitary right paratracheal lymph node.   01/23/2018 PET scan   1. Overall, no significant change from PET-CT of 3 months ago. There is persistent hypermetabolic activity at the right thoracic inlet and within a right paratracheal mediastinal node. The nodes have not significantly changed in size, although the metabolic activity has minimally increased over this interval. It is uncertain how much of the thoracic inlet metabolic activity is attributed to the thyroid nodule versus adjacent cervical lymph nodes, both previously biopsied. 2. No new hypermetabolic activity within the neck, chest, abdomen or pelvis. No evidence of local recurrence in the pelvis. 3. Stable probable radiation changes in the right lung.   04/16/2018 PET scan   1. Interval increase in metabolic activity of the RIGHT supraclavicular node and RIGHT lower paratracheal mediastinal node with minimal change in size. Findings consistent with persistent and mildly progressed metastatic adenopathy. 2. New hypermetabolic small lymph node in the RIGHT lower paratracheal nodal station adjacent to the previously followed node. 3. No evidence of local  recurrence in the pelvis or new disease elsewhere.   07/30/2018 PET scan   1. Mild interval progression of right supraclavicular, mediastinal, and right hilar hypermetabolic metastatic disease. There is a new hypermetabolic lymph node in the subcarinal station on today's study. 2. No hypermetabolic disease in the abdomen or pelvis to suggest recurrent/metastases. 3.  Aortic Atherosclerois (ICD10-170.0)     08/16/2018 -  Chemotherapy   The patient had Lenvima since 3/6 and pembrolizumab since 3/13 for chemotherapy treatment. Assunta Curtis was held temporarily due to infection and severe hypertension   10/31/2018 PET scan   Partial response to therapy, with decreased hypermetabolic lymphadenopathy in mediastinum and right hilar region.   No significant change in hypermetabolic lymphadenopathy in right inferior neck.   No new or progressive metastatic disease identified. No evidence of recurrent or metastatic carcinoma in the pelvis or abdomen   02/04/2019 PET scan   1. Decrease in metabolic activity of RIGHT supraclavicular node and RIGHT paratracheal lymph nodes. 2. Persistent activity in the RIGHT thyroid nodule unchanged. 3. Diffuse activity through the esophagus is favored esophagitis. No change. 4. Scattered foci of intense metabolic activity associated the bowel without focal lesion on CT favored benign. 5. No evidence of peritoneal metastasis. 6. No evidence of metastatic adenopathy in the abdomen pelvis. 7. No evidence of local pelvic sidewall recurrence.   07/30/2019 PET scan   1. Mildly increased uptake within small nodes along the right sternocleidomastoid without change in size and associated with increased deltoid activity may reflect changes related to right arm injection or recent COVID vaccination, consider clinical correlation and attention on follow-up. 2. Persistent activity in the right thyroid nodule, unchanged from previous exams. 3. New activity in a juxta esophageal lymph  node in the chest is suspicious though the node is unchanged with respect to size. Endoscopic correlation could potentially be helpful or close attention on follow-up. 4. Subtle increased FDG uptake along the left mainstem bronchus and adjacent to the aorta may represent a small lymph node. No discrete correlate is demonstrated on today's study.   10/22/2019 PET scan   1. Persistent FDG avid subcentimeter right supraclavicular and juxta esophageal lymph nodes. The  degree of FDG uptake is similar to the previous exam. No new or progressive findings identified. 2. No findings of solid organ metastasis, skeletal metastasis or metastatic disease to the abdomen or pelvis. 3. FDG avid right lobe of thyroid gland nodule is again noted This nodule is not incidental and been worked up previously with 2 biopsies. Please refer to results from the most recent biopsy dated 07/18/2017. (Ref: J Am Coll Radiol. 2015 Feb;12(2): 143-50). 4.  Aortic Atherosclerosis (ICD10-I70.0).   05/05/2020 PET scan   1. Progressive disease, as evidenced by increased hypermetabolism within right supraclavicular and mediastinal nodes. 2. No infradiaphragmatic hypermetabolic metastasis. 3.  Aortic Atherosclerosis (ICD10-I70.0). 4. Hypermetabolic right thyroid nodule has been detailed on prior exams and was biopsied on 07/18/2017   09/10/2020 PET scan   1. Interval improvement in previously demonstrated hypermetabolic mediastinal and right supraclavicular adenopathy. A single hypermetabolic left paraesophageal lymph node remains, improved from previous study. The other nodes have resolved.  2. No progressive disease. 3. Diffuse esophageal hypermetabolic activity suggesting esophagitis. 4. Hypermetabolic right thyroid nodule has been previously biopsied.   03/18/2021 PET scan   1. No significant change in single residual FDG avid left paraesophageal lymph node. This has an SUV max of 7.03, compared with 6.4 previously. No new sites of  disease identified. 2. Diffuse esophageal hypermetabolic activity suggestive of esophagitis. 3. FDG avid right lobe of thyroid gland nodule. This has been biopsied previously. (Ref: J Am Coll Radiol. 2015 Feb;12(2): 143-50).     09/22/2021 PET scan   1. Persistent mild diffuse FDG uptake in the esophagus likely chronic inflammation. 2. Resolution of the left paraesophageal lymph node seen on the prior study. 3. Stable hypermetabolic right thyroid nodule, previously biopsied. 4. No findings for recurrent abdominal/pelvic disease or metastatic disease.     02/23/2022 -  Chemotherapy   Patient is on Treatment Plan : UTERINE Lenvatinib (20) D1-21 + Pembrolizumab (200) D1 q21d     05/04/2022 PET scan   1. Status post hysterectomy without evidence of hypermetabolic local recurrence or hypermetabolic metastatic disease. 2. Persistent diffuse long segment hypermetabolic esophageal activity, likely reflects chronic esophagitis. Consider further evaluation with endoscopy if not previously performed. 3.  Aortic Atherosclerosis (ICD10-I70.0).     11/09/2022 PET scan   NM PET Image Restage (PS) Skull Base to Thigh (F-18 FDG)  Result Date: 11/09/2022 CLINICAL DATA:  Subsequent treatment strategy for endometrial carcinoma status post hysterectomy. EXAM: NUCLEAR MEDICINE PET SKULL BASE TO THIGH TECHNIQUE: 7.6 mCi F-18 FDG was injected intravenously. Full-ring PET imaging was performed from the skull base to thigh after the radiotracer. CT data was obtained and used for attenuation correction and anatomic localization. Fasting blood glucose: 93 mg/dl COMPARISON:  82/95/6213 FINDINGS: Mediastinal blood pool activity: SUV max 2.44 Liver activity: SUV max NA NECK: No hypermetabolic lymph nodes in the neck. Hypermetabolic right lobe of thyroid gland nodule is again noted with SUV max of 10.86, previously 11.9. Incidental CT findings: None. CHEST: No hypermetabolic mediastinal or hilar nodes. No suspicious pulmonary  nodules on the CT scan. Mild diffuse increased radiotracer uptake within the esophagus is again noted with SUV max 5.28. Previously this measured the same. No discrete mass identified. Incidental CT findings: Aortic atherosclerosis. ABDOMEN/PELVIS: No abnormal hypermetabolic activity within the liver, pancreas, adrenal glands, or spleen. No hypermetabolic lymph nodes in the abdomen or pelvis. Incidental CT findings: Status post hysterectomy. Aortic atherosclerotic calcifications. SKELETON: No focal hypermetabolic activity to suggest skeletal metastasis. Incidental CT findings: None. IMPRESSION: 1. No  specific findings identified to suggest locally recurrent tumor or metastatic disease status post hysterectomy. 2. Persistent, mild, diffuse increased radiotracer uptake within the esophagus. These findings are favored to represent chronic esophagitis. 3. No new or progressive disease identified. 4. Persistent hypermetabolic right lobe of thyroid gland nodule. This has been evaluated on previous imaging and biopsy. (Ref: J Am Coll Radiol. 2015 Feb;12(2): 143-50). 5. Aortic Atherosclerosis (ICD10-I70.0). Electronically Signed   By: Signa Kell M.D.   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 Lenvima was held temporarily due to infection and severe hypertension     PHYSICAL EXAMINATION: ECOG PERFORMANCE STATUS: 0 - Asymptomatic  Vitals:   04/26/23 0837  BP: 128/73  Pulse: 66  Resp: 18  SpO2: 98%   Filed Weights   04/26/23 0837  Weight: 151 lb (68.5 kg)    GENERAL:alert, no distress and comfortable   LABORATORY DATA:  I have reviewed the data as listed    Component Value Date/Time   NA 136 04/26/2023 0808   NA 141 05/17/2017 0957   K 4.2 04/26/2023 0808   K  3.6 05/17/2017 0957   CL 101 04/26/2023 0808   CL 105 11/19/2012 0848   CO2 25 04/26/2023 0808   CO2 25 05/17/2017 0957   GLUCOSE 109 (H) 04/26/2023 0808   GLUCOSE 85 05/17/2017 0957   GLUCOSE 122 (H) 11/19/2012 0848   BUN 22 04/26/2023 0808   BUN 15.2 05/17/2017 0957   CREATININE 0.89 04/26/2023 0808   CREATININE 0.8 05/17/2017 0957   CALCIUM 9.8 04/26/2023 0808   CALCIUM 9.6 05/17/2017 0957   PROT 7.2 04/26/2023 0808   PROT 7.0 05/17/2017 0957   ALBUMIN 4.1 04/26/2023 0808   ALBUMIN 3.9 05/17/2017 0957   AST 22 04/26/2023 0808   AST 25 05/17/2017 0957   ALT 15 04/26/2023 0808   ALT 18 05/17/2017 0957   ALKPHOS 50 04/26/2023 0808   ALKPHOS 57 05/17/2017 0957   BILITOT 0.5 04/26/2023 0808   BILITOT 0.47 05/17/2017 0957   GFRNONAA >60 04/26/2023 0808   GFRAA >60 02/26/2020 0940   GFRAA >60 07/10/2019 1240    No results found for: "SPEP", "UPEP"  Lab Results  Component Value Date   WBC 4.8 04/26/2023   NEUTROABS 2.9 04/26/2023   HGB 11.2 (L) 04/26/2023   HCT 35.5 (L) 04/26/2023   MCV 100.0 04/26/2023   PLT 230 04/26/2023      Chemistry      Component Value Date/Time   NA 136 04/26/2023 0808   NA 141 05/17/2017 0957   K 4.2 04/26/2023 0808   K 3.6 05/17/2017 0957   CL 101 04/26/2023 0808   CL 105 11/19/2012 0848   CO2 25 04/26/2023 0808   CO2 25 05/17/2017 0957   BUN 22 04/26/2023 0808   BUN 15.2 05/17/2017 0957   CREATININE 0.89 04/26/2023 0808   CREATININE 0.8 05/17/2017 0957   GLU 158 (H) 01/14/2015 1035      Component Value Date/Time   CALCIUM 9.8 04/26/2023 0808   CALCIUM 9.6 05/17/2017 0957   ALKPHOS 50 04/26/2023 0808   ALKPHOS 57 05/17/2017 0957   AST 22 04/26/2023 0808   AST 25 05/17/2017 0957   ALT 15 04/26/2023 0808   ALT 18 05/17/2017 0957  BILITOT 0.5 04/26/2023 0808   BILITOT 0.47 05/17/2017 0957

## 2023-04-26 NOTE — Patient Instructions (Signed)

## 2023-04-29 ENCOUNTER — Other Ambulatory Visit: Payer: Self-pay

## 2023-05-04 ENCOUNTER — Other Ambulatory Visit (HOSPITAL_COMMUNITY): Payer: Self-pay

## 2023-05-14 ENCOUNTER — Other Ambulatory Visit: Payer: Self-pay

## 2023-05-18 ENCOUNTER — Other Ambulatory Visit: Payer: Self-pay

## 2023-05-18 NOTE — Progress Notes (Signed)
Specialty Pharmacy Refill Coordination Note  Diane Lozano is a 67 y.o. female contacted today regarding refills of specialty medication(s) Lenvatinib Mesylate [facility])   Patient requested Delivery   Delivery date: 05/22/23   Verified address: [address] [place] [address]   Medication will be filled on 05/21/23.

## 2023-05-24 ENCOUNTER — Other Ambulatory Visit (HOSPITAL_COMMUNITY): Payer: Self-pay

## 2023-05-25 ENCOUNTER — Encounter (HOSPITAL_COMMUNITY)
Admission: RE | Admit: 2023-05-25 | Discharge: 2023-05-25 | Disposition: A | Discharge status: Home / Self Care | Payer: 59 | Source: Ambulatory Visit | Attending: Hematology and Oncology | Admitting: Hematology and Oncology

## 2023-05-25 DIAGNOSIS — I700 Atherosclerosis of aorta: Secondary | ICD-10-CM | POA: Diagnosis not present

## 2023-05-25 DIAGNOSIS — C541 Malignant neoplasm of endometrium: Secondary | ICD-10-CM | POA: Insufficient documentation

## 2023-05-25 DIAGNOSIS — E079 Disorder of thyroid, unspecified: Secondary | ICD-10-CM | POA: Insufficient documentation

## 2023-05-25 LAB — GLUCOSE, CAPILLARY: Glucose-Capillary: 78 mg/dL (ref 70–99)

## 2023-05-25 MED ORDER — FLUDEOXYGLUCOSE F - 18 (FDG) INJECTION
7.5200 | Freq: Once | INTRAVENOUS | Status: AC
  Administered 2023-05-25: 7.52 via INTRAVENOUS

## 2023-05-31 ENCOUNTER — Encounter: Payer: Self-pay | Admitting: Hematology and Oncology

## 2023-06-07 ENCOUNTER — Inpatient Hospital Stay: Payer: 59

## 2023-06-07 ENCOUNTER — Encounter: Payer: Self-pay | Admitting: Hematology and Oncology

## 2023-06-07 ENCOUNTER — Inpatient Hospital Stay (HOSPITAL_BASED_OUTPATIENT_CLINIC_OR_DEPARTMENT_OTHER): Payer: 59 | Admitting: Hematology and Oncology

## 2023-06-07 ENCOUNTER — Inpatient Hospital Stay: Payer: 59 | Attending: Hematology and Oncology

## 2023-06-07 VITALS — BP 151/75 | HR 101 | Temp 98.2°F | Resp 18 | Ht 64.0 in | Wt 146.4 lb

## 2023-06-07 VITALS — BP 120/78 | HR 74 | Resp 16

## 2023-06-07 DIAGNOSIS — E538 Deficiency of other specified B group vitamins: Secondary | ICD-10-CM | POA: Insufficient documentation

## 2023-06-07 DIAGNOSIS — I10 Essential (primary) hypertension: Secondary | ICD-10-CM | POA: Insufficient documentation

## 2023-06-07 DIAGNOSIS — Z5112 Encounter for antineoplastic immunotherapy: Secondary | ICD-10-CM | POA: Diagnosis not present

## 2023-06-07 DIAGNOSIS — Z90722 Acquired absence of ovaries, bilateral: Secondary | ICD-10-CM | POA: Diagnosis not present

## 2023-06-07 DIAGNOSIS — C770 Secondary and unspecified malignant neoplasm of lymph nodes of head, face and neck: Secondary | ICD-10-CM | POA: Diagnosis not present

## 2023-06-07 DIAGNOSIS — T451X5A Adverse effect of antineoplastic and immunosuppressive drugs, initial encounter: Secondary | ICD-10-CM

## 2023-06-07 DIAGNOSIS — E041 Nontoxic single thyroid nodule: Secondary | ICD-10-CM | POA: Diagnosis not present

## 2023-06-07 DIAGNOSIS — E039 Hypothyroidism, unspecified: Secondary | ICD-10-CM | POA: Diagnosis not present

## 2023-06-07 DIAGNOSIS — Z90710 Acquired absence of both cervix and uterus: Secondary | ICD-10-CM | POA: Insufficient documentation

## 2023-06-07 DIAGNOSIS — C541 Malignant neoplasm of endometrium: Secondary | ICD-10-CM | POA: Diagnosis not present

## 2023-06-07 DIAGNOSIS — Z95828 Presence of other vascular implants and grafts: Secondary | ICD-10-CM

## 2023-06-07 LAB — CBC WITH DIFFERENTIAL (CANCER CENTER ONLY)
Abs Immature Granulocytes: 0.01 10*3/uL (ref 0.00–0.07)
Basophils Absolute: 0.1 10*3/uL (ref 0.0–0.1)
Basophils Relative: 1 %
Eosinophils Absolute: 0.4 10*3/uL (ref 0.0–0.5)
Eosinophils Relative: 8 %
HCT: 34.8 % — ABNORMAL LOW (ref 36.0–46.0)
Hemoglobin: 11.5 g/dL — ABNORMAL LOW (ref 12.0–15.0)
Immature Granulocytes: 0 %
Lymphocytes Relative: 23 %
Lymphs Abs: 1.1 10*3/uL (ref 0.7–4.0)
MCH: 31.4 pg (ref 26.0–34.0)
MCHC: 33 g/dL (ref 30.0–36.0)
MCV: 95.1 fL (ref 80.0–100.0)
Monocytes Absolute: 0.3 10*3/uL (ref 0.1–1.0)
Monocytes Relative: 7 %
Neutro Abs: 2.8 10*3/uL (ref 1.7–7.7)
Neutrophils Relative %: 61 %
Platelet Count: 243 10*3/uL (ref 150–400)
RBC: 3.66 MIL/uL — ABNORMAL LOW (ref 3.87–5.11)
RDW: 13.1 % (ref 11.5–15.5)
WBC Count: 4.6 10*3/uL (ref 4.0–10.5)
nRBC: 0 % (ref 0.0–0.2)

## 2023-06-07 LAB — CMP (CANCER CENTER ONLY)
ALT: 14 U/L (ref 0–44)
AST: 20 U/L (ref 15–41)
Albumin: 4.2 g/dL (ref 3.5–5.0)
Alkaline Phosphatase: 53 U/L (ref 38–126)
Anion gap: 10 (ref 5–15)
BUN: 20 mg/dL (ref 8–23)
CO2: 27 mmol/L (ref 22–32)
Calcium: 9.9 mg/dL (ref 8.9–10.3)
Chloride: 102 mmol/L (ref 98–111)
Creatinine: 0.91 mg/dL (ref 0.44–1.00)
GFR, Estimated: >60 mL/min (ref >60)
Glucose, Bld: 81 mg/dL (ref 70–99)
Potassium: 3.8 mmol/L (ref 3.5–5.1)
Sodium: 139 mmol/L (ref 135–145)
Total Bilirubin: 0.4 mg/dL (ref 0.0–1.2)
Total Protein: 7.6 g/dL (ref 6.5–8.1)

## 2023-06-07 LAB — VITAMIN B12: Vitamin B-12: 437 pg/mL (ref 180–914)

## 2023-06-07 LAB — TOTAL PROTEIN, URINE DIPSTICK: Protein, ur: 100 mg/dL — AB

## 2023-06-07 MED ORDER — HEPARIN SOD (PORK) LOCK FLUSH 100 UNIT/ML IV SOLN
500.0000 [IU] | Freq: Once | INTRAVENOUS | Status: AC | PRN
  Administered 2023-06-07: 500 [IU]

## 2023-06-07 MED ORDER — SODIUM CHLORIDE 0.9% FLUSH
10.0000 mL | INTRAVENOUS | Status: DC | PRN
Start: 2023-06-07 — End: 2023-06-07
  Administered 2023-06-07: 10 mL

## 2023-06-07 MED ORDER — SODIUM CHLORIDE 0.9% FLUSH
10.0000 mL | Freq: Once | INTRAVENOUS | Status: AC
  Administered 2023-06-07: 10 mL

## 2023-06-07 MED ORDER — SODIUM CHLORIDE 0.9 % IV SOLN
400.0000 mg | Freq: Once | INTRAVENOUS | Status: AC
  Administered 2023-06-07: 400 mg via INTRAVENOUS
  Filled 2023-06-07: qty 16

## 2023-06-07 MED ORDER — SODIUM CHLORIDE 0.9 % IV SOLN: Freq: Once | INTRAVENOUS | Status: AC

## 2023-06-07 NOTE — Assessment & Plan Note (Signed)
 Unfortunately, PET/CT results are not available yet I reviewed imaging study with the patient which show no evidence of active disease Per previous discussion, our plan would be to continue treatment until April of this year before we decide whether we should discontinue treatment or not Continue supportive care

## 2023-06-07 NOTE — Assessment & Plan Note (Signed)
She will continue monthly vitamin B12 injection Last B12 level in January was adequate

## 2023-06-07 NOTE — Patient Instructions (Signed)

## 2023-06-07 NOTE — Assessment & Plan Note (Signed)
The thyroid nodule has been biopsied before We will observe only for now even though PET CT scan show activity

## 2023-06-07 NOTE — Assessment & Plan Note (Signed)
 Blood pressure is high today with some mild proteinuria but she is not symptomatic We discussed importance of dietary modification and close monitoring of blood pressure She will contact me if her medication needs to be increased

## 2023-06-07 NOTE — Progress Notes (Signed)
 [facility] OFFICE PROGRESS NOTE  Patient Care Team: Cleotilde Planas, MD as PCP - General (Family Medicine) Nichole Senior, MD as Consulting Physician (Endocrinology)  ASSESSMENT & PLAN:  Endometrial cancer [facility]) Unfortunately, PET/CT results are not available yet I reviewed imaging study with the patient which show no evidence of active disease Per previous discussion, our plan would be to continue treatment until April of this year before we decide whether we should discontinue treatment or not Continue supportive care  Essential hypertension Blood pressure is high today with some mild proteinuria but she is not symptomatic We discussed importance of dietary modification and close monitoring of blood pressure She will contact me if her medication needs to be increased  Right thyroid  nodule The thyroid  nodule has been biopsied before We will observe only for now even though PET CT scan show activity  Vitamin B12 deficiency She will continue monthly vitamin B12 injection Last B12 level in January was adequate  Orders Placed This Encounter  Procedures   CBC with Differential (Cancer Center Only)    Standing Status:   Future    Expected Date:   07/19/2023    Expiration Date:   07/18/2024   CMP (Cancer Center only)    Standing Status:   Future    Expected Date:   07/19/2023    Expiration Date:   07/18/2024   TSH    Standing Status:   Standing    Number of Occurrences:   22    Expiration Date:   06/06/2024    All questions were answered. The patient knows to call the clinic with any problems, questions or concerns. The total time spent in the appointment was 30 minutes encounter with patients including review of chart and various tests results, discussions about plan of care and coordination of care plan   Diane Bedford, MD 06/07/2023 12:57 PM  INTERVAL HISTORY: Please see below for problem oriented charting. she returns for treatment follow-up We discussed and  reviewed imaging studies together She has recent heartburn/reflux but have no swallowing difficulties She have no other side effects from treatment  REVIEW OF SYSTEMS:   Constitutional: Denies fevers, chills or abnormal weight loss Eyes: Denies blurriness of vision Ears, nose, mouth, throat, and face: Denies mucositis or sore throat Respiratory: Denies cough, dyspnea or wheezes Cardiovascular: Denies palpitation, chest discomfort or lower extremity swelling Skin: Denies abnormal skin rashes Lymphatics: Denies new lymphadenopathy or easy bruising Neurological:Denies numbness, tingling or new weaknesses Behavioral/Psych: Mood is stable, no new changes  All other systems were reviewed with the patient and are negative.  I have reviewed the past medical history, past surgical history, social history and family history with the patient and they are unchanged from previous note.  ALLERGIES:  is allergic to lenvima  [lenvatinib ].  MEDICATIONS:  Current Outpatient Medications  Medication Sig Dispense Refill   amLODipine  (NORVASC ) 10 MG tablet Take 1 tablet (10 mg total) by mouth daily.**DECREASE SIMVASTATIN  TO 20 MG DAILY WHILE ON AMLODIPINE  PER MD** 90 tablet 1   busPIRone  (BUSPAR ) 10 MG tablet Take 1 tablet (10 mg total) by mouth 2 (two) times daily. 180 tablet 3   Cholecalciferol (VITAMIN D3) 2000 units TABS Take 2,000 Units by mouth daily.      cyanocobalamin  (DODEX ) 1000 MCG/ML injection Inject 1 mL into the muscle every 30 days. 1 mL 11   hydrochlorothiazide  (MICROZIDE ) 12.5 MG capsule Take 1 capsule (12.5 mg total) by mouth daily. 90 capsule 3   lenvatinib  10 mg  daily dose (LENVIMA ) capsule TAKE 1 CAPSULE (10 MG TOTAL) BY MOUTH DAILY. 30 each 11   levothyroxine  (SYNTHROID ) 100 MCG tablet Take 1 tablet (100 mcg total) by mouth daily before breakfast. 60 tablet 1   levothyroxine  (SYNTHROID ) 137 MCG tablet Take 1 tablet (137 mcg total) by mouth in the morning on an empty stomach for 90 days.  90 tablet 3   losartan  (COZAAR ) 25 MG tablet Take 1 tablet (25 mg total) by mouth daily. 90 tablet 3   metoprolol  tartrate (LOPRESSOR ) 25 MG tablet Take 1 tablet (25 mg total) by mouth 2 (two) times daily. 180 tablet 3   ondansetron  (ZOFRAN ) 8 MG tablet Take 1 tablet (8 mg total) by mouth every 8 (eight) hours as needed for nausea. 30 tablet 3   pantoprazole  (PROTONIX ) 40 MG tablet Take 1 tablet (40 mg total) by mouth daily. 90 tablet 3   simvastatin  (ZOCOR ) 20 MG tablet Take 1 tablet (20 mg total) by mouth daily in the evening. 90 tablet 4   sodium fluoride  (PREVIDENT 5000 PLUS) 1.1 % CREA dental cream Use as directed 51 g 12   SYRINGE-NEEDLE, DISP, 3 ML 25G X 5/8 3 ML MISC Use as directed to administer B12 shot every 30 days. 12 each 0   tacrolimus  (PROTOPIC ) 0.1 % ointment Apply to affected area on the skin twice a day 30 g 10   venlafaxine  XR (EFFEXOR  XR) 150 MG 24 hr capsule Take 2 capsules (300 mg total) by mouth daily in the morning with food. 180 capsule 3   No current facility-administered medications for this visit.   Facility-Administered Medications Ordered in Other Visits  Medication Dose Route Frequency Provider Last Rate Last Admin   sodium chloride  flush (NS) 0.9 % injection 10 mL  10 mL Intracatheter PRN Lonn, Lason Eveland, MD   10 mL at 06/07/23 1038    SUMMARY OF ONCOLOGIC HISTORY: Oncology History Overview Note  [employer] testing done 11-2015 on surgical path from 2014:    MS stable   TMB low  4 muts/mb   ATM [id number]   ERBB3 T389K   E2H2 rearrangement exon 9   PPP2R1A P179R   TP53 I195N    ER- APPROXIMATELY 25-35% STAINING IN NEOPLASTIC CELLS (INTERMEDIATE)  PR- APPROXIMATELY 25-35% STAINING IN NEOPLASTIC CELLS (STRONG)  Repeat biopsy 04/02/17: ER negative, Her 2 negative  Progressed on Doxil    Endometrial cancer (HCC)  06/20/2012 Pathology Results   Biopsy positive for papillary serous carcinoma   06/20/2012 Genetic Testing   Foundation One testing done  11-2015 on surgical path from 2014:    MS stable   TMB low  4 muts/mb   ATM D29874   ERBB3 T389K   E2H2 rearrangement exon 9   PPP2R1A P179R   TP53 I195N    06/20/2012 Initial Diagnosis   Patient presented to PCP with intermittent vaginal bleeding since ~ Oct 2013, endometrial biopsy 06-20-12 with complex endometrial hyperplasia with atypia   06/26/2012 Imaging   Thickened endometrial lining in a postmenopausal patient experiencing vaginal bleeding. In the setting of post-menopausal bleeding, endometrial sampling is indicated to exclude carcinoma. No focal myometrial abnormalities are seen.  Normal left ovary and non-visualized right ovary   07/30/2012 Surgery   Dr. Arlee performed robotic hysterectomy with bilateral salpingo-oophorectomy, bilateral pelvic lymph node dissection and periaortic lymph node dissection. Intraoperatively on frozen section, the patient was noted to have a large endometrial polyp with changes within the polyp consistent for high-grade malignancy, possibly papillary serous carcinoma.  There is no obvious extrauterine disease noted.     07/30/2012 Pathology Results   [phone] SUPPLEMENTAL REPORT  THE ENDOMETRIAL CARCINOMA WAS ANALYZED FOR DNA MISMATCH REPAIR PROTEINS.  IMMUNOHISTOCHEMICALLY, THE NEOPLASM RETAINED NUCLEAR EXPRESSION OF 4 GENE PRODUCTS, MLH1, MSH2, MSH6, AND PMS2, INVOLVED IN DNA MISMATCH REPAIR.  POSITIVE AND NEGATIVE CONTROLS WORKED APPROPRIATELY.  PER REQUEST, AN ER AND PR ARE PERFORMED ON BLOCK 1I.  ER- APPROXIMATELY 25-35% STAINING IN NEOPLASTIC CELLS (INTERMEDIATE)  PR- APPROXIMATELY 25-35% STAINING IN NEOPLASTIC CELLS (STRONG)  ER AND PR PREDOMINANTLY SHOW STAINING IN THE SEROUS COMPONENT.  DIAGNOSIS  1. UTERUS, CERVIX WITH BILATERAL FALLOPIAN TUBES AND OVARIES,  HYSTERECTOMY AND BILATERAL SALPINGO-OOPHORECTOMY:  HIGH GRADE/POORLY DIFFERENTIATED ENDOMETRIAL ADENOCARCINOMA WITH SOLID AND SEROUS PAPILLARY COMPONENTS.  HISTOPATHOLOGIC TYPE:     A VARIETY OF PATTERNS WERE PRESENT, ALL OF WHICH SHOULD BE CONSIDERED TO BE HIGH GRADE.  SEROUS PAPILLARY CARCINOMA WAS SEEN OCCURRING ADJACENT TO SOLID ENDOMETRIAL CARCINOMA WITH MARKED ANAPLASIA AND GIANT CELLS. SIZE: TUMOR MEASURED AT LEAST 4.8 CM IN GREATEST HORIZONTAL DIMENSION.  GRADE: POORLY DIFFERENTIATED OR HIGH GRADE. DEPTH OF INVASION: NO DEFINITE MYOMETRIAL INVOLVEMENT WAS SEEN.  THE TUMOR APPEARED CONFINED TO THE POLYP AS WELL AS SURFACE ENDOMETRIUM. WHERE MYOMETRIAL THICKNESS NOT APPLICABLE. SEROSAL INVOLVEMENT:       NOT DEMONSTRATED.  ENDOCERVICAL INVOLVEMENT:  NOT DEMONSTRATED. RESECTION MARGINS:         FREE OF INVOLVEMENT. EXTRAUTERINE EXTENSION:    NOT DEMONSTRATED. ANGIOLYMPHATIC INVASION:   NOT DEFINITELY SEEN. TOTAL NODES EXAMINED:      22.    PELVIC NODES EXAMINED:  20.    PELVIC NODES INVOLVED:  0.    PARA-AORTIC NODES       2. EXAMINED:    PARA-AORTIC NODES       0. INVOLVED TNM STAGE:                 T1A N0 MX. AJCC STAGE GROUPING:       IA. FIGO STAGE:                IA.   08/26/2012 Imaging   No CT evidence for intra-abdominal or pelvic metastatic disease. Trace free pelvic fluid, presumably postoperative although the date of surgery is not documented in the electronic medical record.   08/26/2012 - 12/13/2012 Chemotherapy   She received 6 cycles of carbo/taxol     10/29/2012 - 11/27/2012 Radiation Therapy   May 27, June 5,  June 11, June 19, November 27, 2012: Proximal vagina 30 Gy in 5 fractions      01/17/2013 Imaging   1.  No evidence of recurrent or metastatic disease. 2.  No acute abnormality involving the abdomen or pelvis. 3.  Mild diffuse hepatic steatosis. 4.  Very small supraumbilical midline anterior abdominal wall hernia containing fat, unchanged   01/22/2014 Imaging   No evidence for metastatic or recurrent disease. 2. No bowel obstruction.  Normal appendix. 3. Small fat containing hernia, stable in appearance. 4. Status post hysterectomy and  bilateral oophorectomy   02/19/2014 Imaging   No pulmonary lesions are identified. The abnormality on the chest x-ray is due to asymmetric left-sided sternoclavicular joint degenerative disease   10/12/2014 Imaging   New mild retroperitoneal lymphadenopathy in the left paraaortic region and proximal left common iliac chain, consistent with metastatic disease. No other sites of metastatic disease identified within the abdomen or pelvis.       11/27/2014 - 02/11/2015 Chemotherapy   She received 4 cycles of carbo/taxol   03/01/2015 PET scan   Single hypermetabolic small retroperitoneal lymph node along the aorta. 2. No evidence of metastatic disease otherwise in the abdomen or pelvis. No evidence local recurrence. 3. Intensely hypermetabolic enlarged nodule adjacent to the RIGHT lobe of thyroid  gland. This presumably represents the biopsied lesion in clinician report which was found to be benign thyroid  tissue.   04/05/2015 - 05/13/2015 Radiation Therapy   She received 50.4 gray in 28 fractions with simultaneous integrated boost to 56 gray   06/17/2015 Imaging   No acute process or evidence of metastatic disease in the abdomen or pelvis. Resolution of previously described retroperitoneal adenopathy. 2.  Possible constipation. 3. Atherosclerosis.   06/17/2015 Tumor Marker   Patient's tumor was tested for the following markers: CA125 Results of the tumor marker test revealed 33   08/12/2015 Tumor Marker   Patient's tumor was tested for the following markers: CA125 Results of the tumor marker test revealed 52   08/31/2015 PET scan   Development of right paratracheal hypermetabolic adenopathy, consistent with nodal metastasis. 2. The previously described isolated abdominal retroperitoneal hypermetabolic node has resolved. 3. Persistent hypermetabolic right thyroid  nodule, per report previously biopsied. Correlate with those results. 4.  Possible constipation.   09/09/2015 -  Anti-estrogen oral therapy    She has been receiving alternative treatment between megace  and tamoxifen    09/09/2015 Tumor Marker   Patient's tumor was tested for the following markers: CA125 Results of the tumor marker test revealed 37.8   09/20/2015 Procedure   Technically successful ultrasound-guided thyroid  aspiration biopsy , dominant right nodule   09/20/2015 Pathology Results   THYROID , RIGHT, FINE NEEDLE ASPIRATION (SPECIMEN 1 OF 1 COLLECTED 09/20/15): FINDINGS CONSISTENT WITH BENIGN THYROID  NODULE (BETHESDA CATEGORY II).   10/28/2015 Tumor Marker   Patient's tumor was tested for the following markers: CA125 Results of the tumor marker test revealed 53.2   11/04/2015 Imaging   Stable benign right thyroid  nodule   12/16/2015 Tumor Marker   Patient's tumor was tested for the following markers: CA125 Results of the tumor marker test revealed 38.1   03/22/2016 PET scan   Interval progression of hypermetabolic right paratracheal lymph node consistent with metastatic involvement. 2. Stable hypermetabolic right thyroid  nodule. Reportedly this has been biopsied in the past.   03/23/2016 Tumor Marker   Patient's tumor was tested for the following markers: CA125 Results of the tumor marker test revealed 62.4   04/13/2016 - 06/01/2016 Radiation Therapy   She received 56 Gy to the chest in 28 fractions    05/09/2016 Imaging   No evidence of left lower extremity deep vein thrombosis. No evidence of a superficial thrombosis of the greater and lesser saphenous veins. Positive for thrombus noted in several varicosities of the calf. No evidence of Baker's cyst on the left.   05/17/2016 Tumor Marker   Patient's tumor was tested for the following markers: CA125 Results of the tumor marker test revealed 9   06/29/2016 Tumor Marker   Patient's tumor was tested for the following markers: CA125 Results of the tumor marker test revealed 5.9   08/04/2016 Tumor Marker   Patient's tumor was tested for the following markers:  CA125 Results of the tumor marker test revealed 6.0   09/12/2016 PET scan   Complete metabolic response to therapy, with resolution of hypermetabolic mediastinal lymphadenopathy since prior exam. No residual or new metastatic disease identified. Stable hypermetabolic right thyroid  lobe nodule, which was previously biopsied on 09/20/2015.   03/13/2017 PET scan   1. Solitary  focus of recurrent right paratracheal hypermetabolic activity, with a 1.0 cm right lower paratracheal node having a maximum SUV of 9.0. Appearance compatible with recurrent malignancy. 2. Continued hypermetabolic right thyroid  nodule, previously biopsied, presumed benign -correlate with prior biopsy results. 3. Other imaging findings of potential clinical significance: Aortic Atherosclerosis (ICD10-I70.0). Mild cardiomegaly. Prominent stool throughout the colon favors constipation.   04/02/2017 Pathology Results   FINE NEEDLE ASPIRATION, ENDOSCOPIC, (EBUS) 4 R NODE (SPECIMEN 1 OF 2 COLLECTED 04/02/17): MALIGNANT CELLS PRESENT, CONSISTENT WITH CARCINOMA. SEE COMMENT. COMMENT: THE MALIGNANT CELLS ARE POSITIVE FOR P53 AND NEGATIVE FOR ESTROGEN RECEPTOR AND TTF-1. THIS PROFILE IS NON-SPECIFIC, BUT THE P53 POSITIVE STAINING IS SUGGESTIVE OF GYNECOLOGIC PRIMARY.    04/11/2017 Procedure   Successful 8 French right internal jugular vein power port placement with its tip at the SVC/RA junction   04/12/2017 Imaging   Normal LV size with mild LV hypertrophy. EF 55-60%. Normal RV size and systolic function. Aortic valve sclerosis without significant stenosis.   04/18/2017 - 06/14/2017 Chemotherapy   The patient had chemotherapy with Doxil     07/10/2017 Imaging   - Left ventricle: The cavity size was normal. There was mild concentric hypertrophy. Systolic function was normal. The estimated ejection fraction was in the range of 55% to 60%. Wall motion was normal; there were no regional wall motion abnormalities. Doppler parameters are  consistent with abnormal left ventricular relaxation (grade 1 diastolic dysfunction). - Aortic valve: Noncoronary cusp mobility was mildly restricted. - Mitral valve: There was mild regurgitation. - Left atrium: The atrium was mildly dilated. - Atrial septum: No defect or patent foramen ovale was identified.  Impressions:  - Compared to November 2018, global LV longitudinal strain remains normal (has increased)   07/11/2017 PET scan   Increased size and hypermetabolic activity of 3.3 cm right thyroid  lobe nodule. Thyroid  carcinoma cannot be excluded. Recommend repeat ultrasound guided fine needle aspiration to exclude thyroid  carcinoma.  New adjacent hypermetabolic 10 mm right supraclavicular lymph node, suspicious for lymph node metastasis.  Slight increase in size and hypermetabolic activity of solitary right paratracheal lymph node.  No evidence of abdominal or pelvic metastatic disease.   07/18/2017 Procedure   1. Technically successful ultrasound guided fine needle aspiration of indeterminate hypermetabolic right-sided thyroid  nodule/mass. 2. Technically successful ultrasound-guided core needle biopsy of hypermetabolic right lower cervical lymph node.   07/18/2017 Pathology Results   THYROID , FINE NEEDLE ASPIRATION RIGHT (SPECIMEN 1 OF 1, COLLECTED ON 07/18/2017): ATYPIA OF UNDETERMINED SIGNIFICANCE OR FOLLICULAR LESION OF UNDETERMINED SIGNIFICANCE (BETHESDA CATEGORY III). SEE COMMENT. COMMENT: THE SPECIMEN CONSISTS OF SMALL AND MEDIUM SIZED GROUPS OF FOLLICULAR EPITHELIAL CELLS WITH MILD CYTOLOGIC ATYPIA INCLUDING NUCLEAR ENLARGEMENT AND HURTHLE CELL CHANGE. SOME GROUPS ARE ARRANGED AS MICROFOLLICLES. THERE IS MINIMAL BACKGROUND COLLOID. BASED ON THESE FEATURES, A FOLLICULAR LESION/NEOPLASM CAN NOT BE ENTIRELY RULED OUT. A SPECIMEN WILL BE SENT FOR AFIRMA TESTING.   07/19/2017 Pathology Results   Lymph node, needle/core biopsy - METASTATIC PAPILLARY SEROUS CARCINOMA. - SEE  COMMENT. Microscopic Comment Dr. Recardo Likens has reviewed the case and concurs with this interpretation   10/15/2017 PET scan   No new or progressive disease. No evidence of abdominal or pelvic metastatic disease.  Stable hypermetabolic right thyroid  lobe nodule and adjacent right supraclavicular lymph node.  Decreased size and hypermetabolic activity of solitary right paratracheal lymph node.   01/23/2018 PET scan   1. Overall, no significant change from PET-CT of 3 months ago. There is persistent hypermetabolic activity at the right thoracic  inlet and within a right paratracheal mediastinal node. The nodes have not significantly changed in size, although the metabolic activity has minimally increased over this interval. It is uncertain how much of the thoracic inlet metabolic activity is attributed to the thyroid  nodule versus adjacent cervical lymph nodes, both previously biopsied. 2. No new hypermetabolic activity within the neck, chest, abdomen or pelvis. No evidence of local recurrence in the pelvis. 3. Stable probable radiation changes in the right lung.   04/16/2018 PET scan   1. Interval increase in metabolic activity of the RIGHT supraclavicular node and RIGHT lower paratracheal mediastinal node with minimal change in size. Findings consistent with persistent and mildly progressed metastatic adenopathy. 2. New hypermetabolic small lymph node in the RIGHT lower paratracheal nodal station adjacent to the previously followed node. 3. No evidence of local recurrence in the pelvis or new disease elsewhere.   07/30/2018 PET scan   1. Mild interval progression of right supraclavicular, mediastinal, and right hilar hypermetabolic metastatic disease. There is a new hypermetabolic lymph node in the subcarinal station on today's study. 2. No hypermetabolic disease in the abdomen or pelvis to suggest recurrent/metastases. 3.  Aortic Atherosclerois (ICD10-170.0)     08/16/2018 -  Chemotherapy    The patient had Lenvima  since 3/6 and pembrolizumab  since 3/13 for chemotherapy treatment. Lenvima  was held temporarily due to infection and severe hypertension   10/31/2018 PET scan   Partial response to therapy, with decreased hypermetabolic lymphadenopathy in mediastinum and right hilar region.   No significant change in hypermetabolic lymphadenopathy in right inferior neck.   No new or progressive metastatic disease identified. No evidence of recurrent or metastatic carcinoma in the pelvis or abdomen   02/04/2019 PET scan   1. Decrease in metabolic activity of RIGHT supraclavicular node and RIGHT paratracheal lymph nodes. 2. Persistent activity in the RIGHT thyroid  nodule unchanged. 3. Diffuse activity through the esophagus is favored esophagitis. No change. 4. Scattered foci of intense metabolic activity associated the bowel without focal lesion on CT favored benign. 5. No evidence of peritoneal metastasis. 6. No evidence of metastatic adenopathy in the abdomen pelvis. 7. No evidence of local pelvic sidewall recurrence.   07/30/2019 PET scan   1. Mildly increased uptake within small nodes along the right sternocleidomastoid without change in size and associated with increased deltoid activity may reflect changes related to right arm injection or recent COVID vaccination, consider clinical correlation and attention on follow-up. 2. Persistent activity in the right thyroid  nodule, unchanged from previous exams. 3. New activity in a juxta esophageal lymph node in the chest is suspicious though the node is unchanged with respect to size. Endoscopic correlation could potentially be helpful or close attention on follow-up. 4. Subtle increased FDG uptake along the left mainstem bronchus and adjacent to the aorta may represent a small lymph node. No discrete correlate is demonstrated on today's study.   10/22/2019 PET scan   1. Persistent FDG avid subcentimeter right supraclavicular and juxta  esophageal lymph nodes. The degree of FDG uptake is similar to the previous exam. No new or progressive findings identified. 2. No findings of solid organ metastasis, skeletal metastasis or metastatic disease to the abdomen or pelvis. 3. FDG avid right lobe of thyroid  gland nodule is again noted This nodule is not incidental and been worked up previously with 2 biopsies. Please refer to results from the most recent biopsy dated 07/18/2017. (Ref: J Am Coll Radiol. 2015 Feb;12(2): 143-50). 4.  Aortic Atherosclerosis (ICD10-I70.0).   05/05/2020  PET scan   1. Progressive disease, as evidenced by increased hypermetabolism within right supraclavicular and mediastinal nodes. 2. No infradiaphragmatic hypermetabolic metastasis. 3.  Aortic Atherosclerosis (ICD10-I70.0). 4. Hypermetabolic right thyroid  nodule has been detailed on prior exams and was biopsied on 07/18/2017   09/10/2020 PET scan   1. Interval improvement in previously demonstrated hypermetabolic mediastinal and right supraclavicular adenopathy. A single hypermetabolic left paraesophageal lymph node remains, improved from previous study. The other nodes have resolved.  2. No progressive disease. 3. Diffuse esophageal hypermetabolic activity suggesting esophagitis. 4. Hypermetabolic right thyroid  nodule has been previously biopsied.   03/18/2021 PET scan   1. No significant change in single residual FDG avid left paraesophageal lymph node. This has an SUV max of 7.03, compared with 6.4 previously. No new sites of disease identified. 2. Diffuse esophageal hypermetabolic activity suggestive of esophagitis. 3. FDG avid right lobe of thyroid  gland nodule. This has been biopsied previously. (Ref: J Am Coll Radiol. 2015 Feb;12(2): 143-50).     09/22/2021 PET scan   1. Persistent mild diffuse FDG uptake in the esophagus likely chronic inflammation. 2. Resolution of the left paraesophageal lymph node seen on the prior study. 3. Stable hypermetabolic  right thyroid  nodule, previously biopsied. 4. No findings for recurrent abdominal/pelvic disease or metastatic disease.     02/23/2022 -  Chemotherapy   Patient is on Treatment Plan : UTERINE Lenvatinib  (20) D1-21 + Pembrolizumab  (200) D1 q21d     05/04/2022 PET scan   1. Status post hysterectomy without evidence of hypermetabolic local recurrence or hypermetabolic metastatic disease. 2. Persistent diffuse long segment hypermetabolic esophageal activity, likely reflects chronic esophagitis. Consider further evaluation with endoscopy if not previously performed. 3.  Aortic Atherosclerosis (ICD10-I70.0).     11/09/2022 PET scan   NM PET Image Restage (PS) Skull Base to Thigh (F-18 FDG)  Result Date: 11/09/2022 CLINICAL DATA:  Subsequent treatment strategy for endometrial carcinoma status post hysterectomy. EXAM: NUCLEAR MEDICINE PET SKULL BASE TO THIGH TECHNIQUE: 7.6 mCi F-18 FDG was injected intravenously. Full-ring PET imaging was performed from the skull base to thigh after the radiotracer. CT data was obtained and used for attenuation correction and anatomic localization. Fasting blood glucose: 93 mg/dl COMPARISON:  88/70/7976 FINDINGS: Mediastinal blood pool activity: SUV max 2.44 Liver activity: SUV max NA NECK: No hypermetabolic lymph nodes in the neck. Hypermetabolic right lobe of thyroid  gland nodule is again noted with SUV max of 10.86, previously 11.9. Incidental CT findings: None. CHEST: No hypermetabolic mediastinal or hilar nodes. No suspicious pulmonary nodules on the CT scan. Mild diffuse increased radiotracer uptake within the esophagus is again noted with SUV max 5.28. Previously this measured the same. No discrete mass identified. Incidental CT findings: Aortic atherosclerosis. ABDOMEN/PELVIS: No abnormal hypermetabolic activity within the liver, pancreas, adrenal glands, or spleen. No hypermetabolic lymph nodes in the abdomen or pelvis. Incidental CT findings: Status post hysterectomy.  Aortic atherosclerotic calcifications. SKELETON: No focal hypermetabolic activity to suggest skeletal metastasis. Incidental CT findings: None. IMPRESSION: 1. No specific findings identified to suggest locally recurrent tumor or metastatic disease status post hysterectomy. 2. Persistent, mild, diffuse increased radiotracer uptake within the esophagus. These findings are favored to represent chronic esophagitis. 3. No new or progressive disease identified. 4. Persistent hypermetabolic right lobe of thyroid  gland nodule. This has been evaluated on previous imaging and biopsy. (Ref: J Am Coll Radiol. 2015 Feb;12(2): 143-50). 5. Aortic Atherosclerosis (ICD10-I70.0). Electronically Signed   By: Waddell Calk M.D.   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 Lenvima  was held temporarily due to infection and severe hypertension     PHYSICAL EXAMINATION: ECOG PERFORMANCE STATUS: 1 - Symptomatic but completely ambulatory  Vitals:   06/07/23 0843  BP: (!) 151/75  Pulse: (!) 101  Resp: 18  Temp: 98.2 F (36.8 C)  SpO2: 97%   Filed Weights   06/07/23 0843  Weight: 146 lb 6.4 oz (66.4 kg)    GENERAL:alert, no distress and comfortable  LABORATORY DATA:  I have reviewed the data as listed    Component Value Date/Time   NA 139 06/07/2023 0818   NA 141 05/17/2017 0957   K 3.8 06/07/2023 0818   K 3.6 05/17/2017 0957   CL 102 06/07/2023 0818   CL 105 11/19/2012 0848   CO2 27 06/07/2023 0818   CO2 25 05/17/2017 0957   GLUCOSE 81 06/07/2023 0818   GLUCOSE 85 05/17/2017 0957   GLUCOSE 122 (H) 11/19/2012 0848   BUN 20 06/07/2023 0818   BUN 15.2 05/17/2017 0957   CREATININE 0.91 06/07/2023 0818   CREATININE 0.8 05/17/2017 0957   CALCIUM 9.9 06/07/2023 0818    CALCIUM 9.6 05/17/2017 0957   PROT 7.6 06/07/2023 0818   PROT 7.0 05/17/2017 0957   ALBUMIN 4.2 06/07/2023 0818   ALBUMIN 3.9 05/17/2017 0957   AST 20 06/07/2023 0818   AST 25 05/17/2017 0957   ALT 14 06/07/2023 0818   ALT 18 05/17/2017 0957   ALKPHOS 53 06/07/2023 0818   ALKPHOS 57 05/17/2017 0957   BILITOT 0.4 06/07/2023 0818   BILITOT 0.47 05/17/2017 0957   GFRNONAA >60 06/07/2023 0818   GFRAA >60 02/26/2020 0940   GFRAA >60 07/10/2019 1240    No results found for: SPEP, UPEP  Lab Results  Component Value Date   WBC 4.6 06/07/2023   NEUTROABS 2.8 06/07/2023   HGB 11.5 (L) 06/07/2023   HCT 34.8 (L) 06/07/2023   MCV 95.1 06/07/2023   PLT 243 06/07/2023      Chemistry      Component Value Date/Time   NA 139 06/07/2023 0818   NA 141 05/17/2017 0957   K 3.8 06/07/2023 0818   K 3.6 05/17/2017 0957   CL 102 06/07/2023 0818   CL 105 11/19/2012 0848   CO2 27 06/07/2023 0818   CO2 25 05/17/2017 0957   BUN 20 06/07/2023 0818   BUN 15.2 05/17/2017 0957   CREATININE 0.91 06/07/2023 0818   CREATININE 0.8 05/17/2017 0957   GLU 158 (H) 01/14/2015 1035      Component Value Date/Time   CALCIUM 9.9 06/07/2023 0818   CALCIUM 9.6 05/17/2017 0957   ALKPHOS 53 06/07/2023 0818   ALKPHOS 57 05/17/2017 0957   AST 20 06/07/2023 0818   AST 25 05/17/2017 0957   ALT 14 06/07/2023 0818   ALT 18 05/17/2017 0957   BILITOT 0.4 06/07/2023 0818   BILITOT 0.47 05/17/2017 0957

## 2023-06-08 ENCOUNTER — Other Ambulatory Visit: Payer: Self-pay

## 2023-06-11 ENCOUNTER — Other Ambulatory Visit: Payer: Self-pay

## 2023-06-11 ENCOUNTER — Other Ambulatory Visit: Payer: Self-pay | Admitting: Hematology and Oncology

## 2023-06-11 ENCOUNTER — Other Ambulatory Visit (HOSPITAL_COMMUNITY): Payer: Self-pay

## 2023-06-11 ENCOUNTER — Telehealth: Payer: Self-pay

## 2023-06-11 MED ORDER — LEVOTHYROXINE SODIUM 100 MCG PO TABS
100.0000 ug | ORAL_TABLET | Freq: Every day | ORAL | 1 refills | Status: DC
  Filled 2023-06-11: qty 60, 60d supply, fill #0

## 2023-06-11 NOTE — Telephone Encounter (Signed)
-----   Message from Artis Delay sent at 06/11/2023 10:57 AM EST ----- Pls call her and let her know PET CT result is available, as discussed, no evidence of recurrent cancer

## 2023-06-11 NOTE — Telephone Encounter (Signed)
 Called and given below message. She verbalized understanding.

## 2023-06-12 ENCOUNTER — Other Ambulatory Visit: Payer: Self-pay

## 2023-06-15 ENCOUNTER — Other Ambulatory Visit: Payer: Self-pay

## 2023-06-20 ENCOUNTER — Other Ambulatory Visit: Payer: Self-pay

## 2023-06-21 ENCOUNTER — Other Ambulatory Visit: Payer: Self-pay

## 2023-06-21 ENCOUNTER — Other Ambulatory Visit: Payer: Self-pay | Admitting: Hematology and Oncology

## 2023-06-21 ENCOUNTER — Other Ambulatory Visit (HOSPITAL_COMMUNITY): Payer: Self-pay

## 2023-06-21 MED ORDER — LENVATINIB (10 MG DAILY DOSE) 10 MG PO CPPK
ORAL_CAPSULE | Freq: Every day | ORAL | 11 refills | Status: DC
  Filled 2023-06-21: qty 30, 30d supply, fill #0
  Filled 2023-07-18: qty 30, 30d supply, fill #1
  Filled 2023-08-21: qty 30, 30d supply, fill #2
  Filled 2023-09-27: qty 30, 30d supply, fill #3

## 2023-06-21 NOTE — Progress Notes (Signed)
Specialty Pharmacy Refill Coordination Note  Diane Lozano is a 68 y.o. female contacted today regarding refills of specialty medication(s) Lenvatinib Mesylate [facility])   Patient requested Delivery   Delivery date: 06/28/23   Verified address: [address]   [place] [address]   Medication will be filled on 06/27/23.   Pending refill request

## 2023-06-27 ENCOUNTER — Other Ambulatory Visit: Payer: Self-pay

## 2023-07-03 ENCOUNTER — Other Ambulatory Visit (HOSPITAL_BASED_OUTPATIENT_CLINIC_OR_DEPARTMENT_OTHER): Payer: Self-pay

## 2023-07-03 ENCOUNTER — Other Ambulatory Visit (HOSPITAL_COMMUNITY): Payer: Self-pay

## 2023-07-03 ENCOUNTER — Other Ambulatory Visit: Payer: Self-pay

## 2023-07-03 MED ORDER — COVID-19 MRNA VAC-TRIS(PFIZER) 30 MCG/0.3ML IM SUSY
0.3000 mL | PREFILLED_SYRINGE | Freq: Once | INTRAMUSCULAR | 0 refills | Status: AC
  Filled 2023-07-03: qty 0.3, 1d supply, fill #0

## 2023-07-07 ENCOUNTER — Other Ambulatory Visit: Payer: Self-pay

## 2023-07-18 ENCOUNTER — Other Ambulatory Visit: Payer: Self-pay

## 2023-07-18 ENCOUNTER — Other Ambulatory Visit (HOSPITAL_COMMUNITY): Payer: Self-pay

## 2023-07-18 NOTE — Progress Notes (Signed)
Specialty Pharmacy Refill Coordination Note  Diane Lozano is a 68 y.o. female contacted today regarding refills of specialty medication(s) Lenvatinib Mesylate [facility])   Patient requested Delivery   Delivery date: 08/01/23   Verified address: [address]   [place] [address]   Medication will be filled on 07/31/23.

## 2023-07-19 ENCOUNTER — Inpatient Hospital Stay: Payer: 59

## 2023-07-19 ENCOUNTER — Encounter: Payer: Self-pay | Admitting: Hematology and Oncology

## 2023-07-19 ENCOUNTER — Inpatient Hospital Stay: Payer: 59 | Attending: Hematology and Oncology

## 2023-07-19 ENCOUNTER — Inpatient Hospital Stay (HOSPITAL_BASED_OUTPATIENT_CLINIC_OR_DEPARTMENT_OTHER): Payer: 59 | Admitting: Hematology and Oncology

## 2023-07-19 VITALS — BP 132/81 | HR 74 | Temp 97.9°F | Resp 18 | Ht 64.0 in | Wt 150.6 lb

## 2023-07-19 DIAGNOSIS — Z171 Estrogen receptor negative status [ER-]: Secondary | ICD-10-CM | POA: Insufficient documentation

## 2023-07-19 DIAGNOSIS — D6481 Anemia due to antineoplastic chemotherapy: Secondary | ICD-10-CM | POA: Insufficient documentation

## 2023-07-19 DIAGNOSIS — C541 Malignant neoplasm of endometrium: Secondary | ICD-10-CM

## 2023-07-19 DIAGNOSIS — Z79620 Long term (current) use of immunosuppressive biologic: Secondary | ICD-10-CM | POA: Diagnosis not present

## 2023-07-19 DIAGNOSIS — Z923 Personal history of irradiation: Secondary | ICD-10-CM | POA: Diagnosis not present

## 2023-07-19 DIAGNOSIS — E039 Hypothyroidism, unspecified: Secondary | ICD-10-CM

## 2023-07-19 DIAGNOSIS — T451X5A Adverse effect of antineoplastic and immunosuppressive drugs, initial encounter: Secondary | ICD-10-CM

## 2023-07-19 DIAGNOSIS — Z90722 Acquired absence of ovaries, bilateral: Secondary | ICD-10-CM | POA: Diagnosis not present

## 2023-07-19 DIAGNOSIS — Z1732 Human epidermal growth factor receptor 2 negative status: Secondary | ICD-10-CM | POA: Diagnosis not present

## 2023-07-19 DIAGNOSIS — D61818 Other pancytopenia: Secondary | ICD-10-CM | POA: Insufficient documentation

## 2023-07-19 DIAGNOSIS — Z5112 Encounter for antineoplastic immunotherapy: Secondary | ICD-10-CM | POA: Diagnosis not present

## 2023-07-19 DIAGNOSIS — Z1722 Progesterone receptor negative status: Secondary | ICD-10-CM | POA: Diagnosis not present

## 2023-07-19 DIAGNOSIS — I10 Essential (primary) hypertension: Secondary | ICD-10-CM

## 2023-07-19 DIAGNOSIS — C770 Secondary and unspecified malignant neoplasm of lymph nodes of head, face and neck: Secondary | ICD-10-CM | POA: Insufficient documentation

## 2023-07-19 DIAGNOSIS — Z90710 Acquired absence of both cervix and uterus: Secondary | ICD-10-CM | POA: Insufficient documentation

## 2023-07-19 DIAGNOSIS — Z95828 Presence of other vascular implants and grafts: Secondary | ICD-10-CM

## 2023-07-19 LAB — CBC WITH DIFFERENTIAL (CANCER CENTER ONLY)
Abs Immature Granulocytes: 0 10*3/uL (ref 0.00–0.07)
Basophils Absolute: 0.1 10*3/uL (ref 0.0–0.1)
Basophils Relative: 2 %
Eosinophils Absolute: 0.2 10*3/uL (ref 0.0–0.5)
Eosinophils Relative: 6 %
HCT: 33.2 % — ABNORMAL LOW (ref 36.0–46.0)
Hemoglobin: 10.6 g/dL — ABNORMAL LOW (ref 12.0–15.0)
Immature Granulocytes: 0 %
Lymphocytes Relative: 24 %
Lymphs Abs: 0.9 10*3/uL (ref 0.7–4.0)
MCH: 31.2 pg (ref 26.0–34.0)
MCHC: 31.9 g/dL (ref 30.0–36.0)
MCV: 97.6 fL (ref 80.0–100.0)
Monocytes Absolute: 0.3 10*3/uL (ref 0.1–1.0)
Monocytes Relative: 8 %
Neutro Abs: 2.4 10*3/uL (ref 1.7–7.7)
Neutrophils Relative %: 60 %
Platelet Count: 221 10*3/uL (ref 150–400)
RBC: 3.4 MIL/uL — ABNORMAL LOW (ref 3.87–5.11)
RDW: 13.9 % (ref 11.5–15.5)
WBC Count: 4 10*3/uL (ref 4.0–10.5)
nRBC: 0 % (ref 0.0–0.2)

## 2023-07-19 LAB — CMP (CANCER CENTER ONLY)
ALT: 17 U/L (ref 0–44)
AST: 23 U/L (ref 15–41)
Albumin: 4 g/dL (ref 3.5–5.0)
Alkaline Phosphatase: 47 U/L (ref 38–126)
Anion gap: 7 (ref 5–15)
BUN: 20 mg/dL (ref 8–23)
CO2: 28 mmol/L (ref 22–32)
Calcium: 9.9 mg/dL (ref 8.9–10.3)
Chloride: 102 mmol/L (ref 98–111)
Creatinine: 0.84 mg/dL (ref 0.44–1.00)
GFR, Estimated: >60 mL/min (ref >60)
Glucose, Bld: 82 mg/dL (ref 70–99)
Potassium: 4.1 mmol/L (ref 3.5–5.1)
Sodium: 137 mmol/L (ref 135–145)
Total Bilirubin: 0.5 mg/dL (ref 0.0–1.2)
Total Protein: 7.2 g/dL (ref 6.5–8.1)

## 2023-07-19 LAB — TSH: TSH: 2.995 u[IU]/mL (ref 0.350–4.500)

## 2023-07-19 LAB — TOTAL PROTEIN, URINE DIPSTICK: Protein, ur: NEGATIVE mg/dL

## 2023-07-19 MED ORDER — SODIUM CHLORIDE 0.9 % IV SOLN
400.0000 mg | Freq: Once | INTRAVENOUS | Status: AC
  Administered 2023-07-19: 400 mg via INTRAVENOUS
  Filled 2023-07-19: qty 16

## 2023-07-19 MED ORDER — SODIUM CHLORIDE 0.9 % IV SOLN: Freq: Once | INTRAVENOUS | Status: AC

## 2023-07-19 MED ORDER — SODIUM CHLORIDE 0.9% FLUSH
10.0000 mL | Freq: Once | INTRAVENOUS | Status: AC
  Administered 2023-07-19: 10 mL

## 2023-07-19 MED ORDER — HEPARIN SOD (PORK) LOCK FLUSH 100 UNIT/ML IV SOLN
500.0000 [IU] | Freq: Once | INTRAVENOUS | Status: AC | PRN
Start: 2023-07-19 — End: 2023-07-19
  Administered 2023-07-19: 500 [IU]

## 2023-07-19 MED ORDER — SODIUM CHLORIDE 0.9% FLUSH
10.0000 mL | INTRAVENOUS | Status: DC | PRN
  Administered 2023-07-19: 10 mL

## 2023-07-19 NOTE — Progress Notes (Signed)
[facility] OFFICE PROGRESS NOTE  Patient Care Team: Sigmund Hazel, MD as PCP - General (Family Medicine) Adrian Prince, MD as Consulting Physician (Endocrinology)  ASSESSMENT & PLAN:  Endometrial cancer [facility]) PET/CT imaging from December showed no evidence of active disease Per previous discussion, our plan would be to continue treatment until April of this year before we decide whether we should discontinue treatment or not Continue supportive care  Anemia due to antineoplastic chemotherapy She had intermittent pancytopenia She will receive vitamin B12 injection monthly Monitor closely  Orders Placed This Encounter  Procedures   CBC with Differential (Cancer Center Only)    Standing Status:   Future    Expected Date:   08/30/2023    Expiration Date:   08/29/2024   CMP (Cancer Center only)    Standing Status:   Future    Expected Date:   08/30/2023    Expiration Date:   08/29/2024    All questions were answered. The patient knows to call the clinic with any problems, questions or concerns. The total time spent in the appointment was 20 minutes encounter with patients including review of chart and various tests results, discussions about plan of care and coordination of care plan   Artis Delay, MD 07/19/2023 10:56 AM  INTERVAL HISTORY: Please see below for problem oriented charting. she returns for chemo follow-up She tolerated recent treatment well Her blood pressure is within normal range We discussed timing of next imaging and future follow-up  REVIEW OF SYSTEMS:   Constitutional: Denies fevers, chills or abnormal weight loss Eyes: Denies blurriness of vision Ears, nose, mouth, throat, and face: Denies mucositis or sore throat Respiratory: Denies cough, dyspnea or wheezes Cardiovascular: Denies palpitation, chest discomfort or lower extremity swelling Gastrointestinal:  Denies nausea, heartburn or change in bowel habits Skin: Denies abnormal skin  rashes Lymphatics: Denies new lymphadenopathy or easy bruising Neurological:Denies numbness, tingling or new weaknesses Behavioral/Psych: Mood is stable, no new changes  All other systems were reviewed with the patient and are negative.  I have reviewed the past medical history, past surgical history, social history and family history with the patient and they are unchanged from previous note.  ALLERGIES:  is allergic to lenvima [lenvatinib].  MEDICATIONS:  Current Outpatient Medications  Medication Sig Dispense Refill   amLODipine (NORVASC) 10 MG tablet Take 1 tablet (10 mg total) by mouth daily.**DECREASE SIMVASTATIN TO 20 MG DAILY WHILE ON AMLODIPINE PER MD** 90 tablet 1   busPIRone (BUSPAR) 10 MG tablet Take 1 tablet (10 mg total) by mouth 2 (two) times daily. 180 tablet 3   Cholecalciferol (VITAMIN D3) 2000 units TABS Take 2,000 Units by mouth daily.      cyanocobalamin (DODEX) 1000 MCG/ML injection Inject 1 mL into the muscle every 30 days. 1 mL 11   hydrochlorothiazide (MICROZIDE) 12.5 MG capsule Take 1 capsule (12.5 mg total) by mouth daily. 90 capsule 3   lenvatinib 10 mg daily dose (LENVIMA) capsule TAKE 1 CAPSULE (10 MG TOTAL) BY MOUTH DAILY. 30 each 11   levothyroxine (SYNTHROID) 100 MCG tablet Take 1 tablet (100 mcg total) by mouth daily before breakfast. 60 tablet 1   levothyroxine (SYNTHROID) 137 MCG tablet Take 1 tablet (137 mcg total) by mouth in the morning on an empty stomach for 90 days. 90 tablet 3   losartan (COZAAR) 25 MG tablet Take 1 tablet (25 mg total) by mouth daily. 90 tablet 3   metoprolol tartrate (LOPRESSOR) 25 MG tablet Take 1 tablet (  25 mg total) by mouth 2 (two) times daily. 180 tablet 3   ondansetron (ZOFRAN) 8 MG tablet Take 1 tablet (8 mg total) by mouth every 8 (eight) hours as needed for nausea. 30 tablet 3   pantoprazole (PROTONIX) 40 MG tablet Take 1 tablet (40 mg total) by mouth daily. 90 tablet 3   simvastatin (ZOCOR) 20 MG tablet Take 1 tablet (20  mg total) by mouth daily in the evening. 90 tablet 4   sodium fluoride (PREVIDENT 5000 PLUS) 1.1 % CREA dental cream Use as directed 51 g 12   SYRINGE-NEEDLE, DISP, 3 ML 25G X 5/8" 3 ML MISC Use as directed to administer B12 shot every 30 days. 12 each 0   tacrolimus (PROTOPIC) 0.1 % ointment Apply to affected area on the skin twice a day 30 g 10   venlafaxine XR (EFFEXOR XR) 150 MG 24 hr capsule Take 2 capsules (300 mg total) by mouth daily in the morning with food. 180 capsule 3   No current facility-administered medications for this visit.   Facility-Administered Medications Ordered in Other Visits  Medication Dose Route Frequency Provider Last Rate Last Admin   heparin lock flush 100 unit/mL  500 Units Intracatheter Once PRN Bertis Ruddy, Burel Kahre, MD       pembrolizumab (KEYTRUDA) 400 mg in sodium chloride 0.9 % 50 mL chemo infusion  400 mg Intravenous Once Bertis Ruddy, Joeline Freer, MD       sodium chloride flush (NS) 0.9 % injection 10 mL  10 mL Intracatheter PRN Artis Delay, MD        SUMMARY OF ONCOLOGIC HISTORY: Oncology History Overview Note  [employer] [facility] testing done 11-2015 on surgical path from 2014:    MS stable   TMB low  4 muts/mb   ATM [id number]   ERBB3 T389K   E2H2 rearrangement exon 9   PPP2R1A P179R   TP53 I195N    ER- APPROXIMATELY 25-35% STAINING IN NEOPLASTIC CELLS (INTERMEDIATE)  PR- APPROXIMATELY 25-35% STAINING IN NEOPLASTIC CELLS (STRONG)  Repeat biopsy 04/02/17: ER negative, Her 2 negative  Progressed on Doxil   Endometrial cancer (HCC)  06/20/2012 Pathology Results   Biopsy positive for papillary serous carcinoma   06/20/2012 Genetic Testing   Foundation One testing done 11-2015 on surgical path from 2014:    MS stable   TMB low  4 muts/mb   ATM [id number]   ERBB3 T389K   E2H2 rearrangement exon 9   PPP2R1A P179R   TP53 I195N    06/20/2012 Initial Diagnosis   Patient presented to PCP with intermittent vaginal bleeding since ~ Oct 2013, endometrial biopsy 06-20-12 with  complex endometrial hyperplasia with atypia   06/26/2012 Imaging   Thickened endometrial lining in a postmenopausal patient experiencing vaginal bleeding. In the setting of post-menopausal bleeding, endometrial sampling is indicated to exclude carcinoma. No focal myometrial abnormalities are seen.  Normal left ovary and non-visualized right ovary   07/30/2012 Surgery   Dr. Clifton James performed robotic hysterectomy with bilateral salpingo-oophorectomy, bilateral pelvic lymph node dissection and periaortic lymph node dissection. Intraoperatively on frozen section, the patient was noted to have a large endometrial polyp with changes within the polyp consistent for high-grade malignancy, possibly papillary serous carcinoma. There is no obvious extrauterine disease noted.     07/30/2012 Pathology Results   [phone] SUPPLEMENTAL REPORT  THE ENDOMETRIAL CARCINOMA WAS ANALYZED FOR DNA MISMATCH REPAIR PROTEINS.  IMMUNOHISTOCHEMICALLY, THE NEOPLASM RETAINED NUCLEAR EXPRESSION OF 4 GENE PRODUCTS, MLH1, MSH2, MSH6, AND PMS2, INVOLVED IN DNA MISMATCH REPAIR.  POSITIVE AND NEGATIVE CONTROLS WORKED APPROPRIATELY.  PER REQUEST, AN ER AND PR ARE PERFORMED ON BLOCK 1I.  ER- APPROXIMATELY 25-35% STAINING IN NEOPLASTIC CELLS (INTERMEDIATE)  PR- APPROXIMATELY 25-35% STAINING IN NEOPLASTIC CELLS (STRONG)  ER AND PR PREDOMINANTLY SHOW STAINING IN THE SEROUS COMPONENT.  DIAGNOSIS  1. UTERUS, CERVIX WITH BILATERAL FALLOPIAN TUBES AND OVARIES,  HYSTERECTOMY AND BILATERAL SALPINGO-OOPHORECTOMY:  HIGH GRADE/POORLY DIFFERENTIATED ENDOMETRIAL ADENOCARCINOMA WITH SOLID AND SEROUS PAPILLARY COMPONENTS.  HISTOPATHOLOGIC TYPE:    A VARIETY OF PATTERNS WERE PRESENT, ALL OF WHICH SHOULD BE CONSIDERED TO BE HIGH GRADE.  SEROUS PAPILLARY CARCINOMA WAS SEEN OCCURRING ADJACENT TO SOLID ENDOMETRIAL CARCINOMA WITH MARKED ANAPLASIA AND GIANT CELLS. SIZE: TUMOR MEASURED AT LEAST 4.8 CM IN GREATEST HORIZONTAL DIMENSION.  GRADE: POORLY  DIFFERENTIATED OR HIGH GRADE. DEPTH OF INVASION: NO DEFINITE MYOMETRIAL INVOLVEMENT WAS SEEN.  THE TUMOR APPEARED CONFINED TO THE POLYP AS WELL AS SURFACE ENDOMETRIUM. WHERE MYOMETRIAL THICKNESS NOT APPLICABLE. SEROSAL INVOLVEMENT:       NOT DEMONSTRATED.  ENDOCERVICAL INVOLVEMENT:  NOT DEMONSTRATED. RESECTION MARGINS:         FREE OF INVOLVEMENT. EXTRAUTERINE EXTENSION:    NOT DEMONSTRATED. ANGIOLYMPHATIC INVASION:   NOT DEFINITELY SEEN. TOTAL NODES EXAMINED:      22.    PELVIC NODES EXAMINED:  20.    PELVIC NODES INVOLVED:  0.    PARA-AORTIC NODES       2. EXAMINED:    PARA-AORTIC NODES       0. INVOLVED TNM STAGE:                 T1A N0 MX. AJCC STAGE GROUPING:       IA. FIGO STAGE:                IA.   08/26/2012 Imaging   No CT evidence for intra-abdominal or pelvic metastatic disease. Trace free pelvic fluid, presumably postoperative although the date of surgery is not documented in the electronic medical record.   08/26/2012 - 12/13/2012 Chemotherapy   She received 6 cycles of carbo/taxol    10/29/2012 - 11/27/2012 Radiation Therapy   May 27, June 5,  June 11, June 19, November 27, 2012: Proximal vagina 30 Gy in 5 fractions      01/17/2013 Imaging   1.  No evidence of recurrent or metastatic disease. 2.  No acute abnormality involving the abdomen or pelvis. 3.  Mild diffuse hepatic steatosis. 4.  Very small supraumbilical midline anterior abdominal wall hernia containing fat, unchanged   01/22/2014 Imaging   No evidence for metastatic or recurrent disease. 2. No bowel obstruction.  Normal appendix. 3. Small fat containing hernia, stable in appearance. 4. Status post hysterectomy and bilateral oophorectomy   02/19/2014 Imaging   No pulmonary lesions are identified. The abnormality on the chest x-ray is due to asymmetric left-sided sternoclavicular joint degenerative disease   10/12/2014 Imaging   New mild retroperitoneal lymphadenopathy in the left paraaortic region and proximal left  common iliac chain, consistent with metastatic disease. No other sites of metastatic disease identified within the abdomen or pelvis.       11/27/2014 - 02/11/2015 Chemotherapy   She received 4 cycles of carbo/taxol    03/01/2015 PET scan   Single hypermetabolic small retroperitoneal lymph node along the aorta. 2. No evidence of metastatic disease otherwise in the abdomen or pelvis. No evidence local recurrence. 3. Intensely hypermetabolic enlarged nodule adjacent to the RIGHT lobe of thyroid gland. This presumably represents the biopsied lesion in clinician  report which was found to be benign thyroid tissue.   04/05/2015 - 05/13/2015 Radiation Therapy   She received 50.4 gray in 28 fractions with simultaneous integrated boost to 56 gray   06/17/2015 Imaging   No acute process or evidence of metastatic disease in the abdomen or pelvis. Resolution of previously described retroperitoneal adenopathy. 2.  Possible constipation. 3. Atherosclerosis.   06/17/2015 Tumor Marker   Patient's tumor was tested for the following markers: CA125 Results of the tumor marker test revealed 33   08/12/2015 Tumor Marker   Patient's tumor was tested for the following markers: CA125 Results of the tumor marker test revealed 52   08/31/2015 PET scan   Development of right paratracheal hypermetabolic adenopathy, consistent with nodal metastasis. 2. The previously described isolated abdominal retroperitoneal hypermetabolic node has resolved. 3. Persistent hypermetabolic right thyroid nodule, per report previously biopsied. Correlate with those results. 4.  Possible constipation.   09/09/2015 -  Anti-estrogen oral therapy   She has been receiving alternative treatment between megace and tamoxifen   09/09/2015 Tumor Marker   Patient's tumor was tested for the following markers: CA125 Results of the tumor marker test revealed 37.8   09/20/2015 Procedure   Technically successful ultrasound-guided thyroid aspiration biopsy ,  dominant right nodule   09/20/2015 Pathology Results   THYROID, RIGHT, FINE NEEDLE ASPIRATION (SPECIMEN 1 OF 1 COLLECTED 09/20/15): FINDINGS CONSISTENT WITH BENIGN THYROID NODULE (BETHESDA CATEGORY II).   10/28/2015 Tumor Marker   Patient's tumor was tested for the following markers: CA125 Results of the tumor marker test revealed 53.2   11/04/2015 Imaging   Stable benign right thyroid nodule   12/16/2015 Tumor Marker   Patient's tumor was tested for the following markers: CA125 Results of the tumor marker test revealed 38.1   03/22/2016 PET scan   Interval progression of hypermetabolic right paratracheal lymph node consistent with metastatic involvement. 2. Stable hypermetabolic right thyroid nodule. Reportedly this has been biopsied in the past.   03/23/2016 Tumor Marker   Patient's tumor was tested for the following markers: CA125 Results of the tumor marker test revealed 62.4   04/13/2016 - 06/01/2016 Radiation Therapy   She received 56 Gy to the chest in 28 fractions    05/09/2016 Imaging   No evidence of left lower extremity deep vein thrombosis. No evidence of a superficial thrombosis of the greater and lesser saphenous veins. Positive for thrombus noted in several varicosities of the calf. No evidence of Baker's cyst on the left.   05/17/2016 Tumor Marker   Patient's tumor was tested for the following markers: CA125 Results of the tumor marker test revealed 9   06/29/2016 Tumor Marker   Patient's tumor was tested for the following markers: CA125 Results of the tumor marker test revealed 5.9   08/04/2016 Tumor Marker   Patient's tumor was tested for the following markers: CA125 Results of the tumor marker test revealed 6.0   09/12/2016 PET scan   Complete metabolic response to therapy, with resolution of hypermetabolic mediastinal lymphadenopathy since prior exam. No residual or new metastatic disease identified. Stable hypermetabolic right thyroid lobe nodule, which was  previously biopsied on 09/20/2015.   03/13/2017 PET scan   1. Solitary focus of recurrent right paratracheal hypermetabolic activity, with a 1.0 cm right lower paratracheal node having a maximum SUV of 9.0. Appearance compatible with recurrent malignancy. 2. Continued hypermetabolic right thyroid nodule, previously biopsied, presumed benign -correlate with prior biopsy results. 3. Other imaging findings of potential clinical significance: Aortic Atherosclerosis (  ICD10-I70.0). Mild cardiomegaly. Prominent stool throughout the colon favors constipation.   04/02/2017 Pathology Results   FINE NEEDLE ASPIRATION, ENDOSCOPIC, (EBUS) 4 R NODE (SPECIMEN 1 OF 2 COLLECTED 04/02/17): MALIGNANT CELLS PRESENT, CONSISTENT WITH CARCINOMA. SEE COMMENT. COMMENT: THE MALIGNANT CELLS ARE POSITIVE FOR P53 AND NEGATIVE FOR ESTROGEN RECEPTOR AND TTF-1. THIS PROFILE IS NON-SPECIFIC, BUT THE P53 POSITIVE STAINING IS SUGGESTIVE OF GYNECOLOGIC PRIMARY.    04/11/2017 Procedure   Successful 8 French right internal jugular vein power port placement with its tip at the SVC/RA junction   04/12/2017 Imaging   Normal LV size with mild LV hypertrophy. EF 55-60%. Normal RV size and systolic function. Aortic valve sclerosis without significant stenosis.   04/18/2017 - 06/14/2017 Chemotherapy   The patient had chemotherapy with Doxil    07/10/2017 Imaging   - Left ventricle: The cavity size was normal. There was mild concentric hypertrophy. Systolic function was normal. The estimated ejection fraction was in the range of 55% to 60%. Wall motion was normal; there were no regional wall motion abnormalities. Doppler parameters are consistent with abnormal left ventricular relaxation (grade 1 diastolic dysfunction). - Aortic valve: Noncoronary cusp mobility was mildly restricted. - Mitral valve: There was mild regurgitation. - Left atrium: The atrium was mildly dilated. - Atrial septum: No defect or patent foramen ovale was  identified.  Impressions:  - Compared to November 2018, global LV longitudinal strain remains normal (has increased)   07/11/2017 PET scan   Increased size and hypermetabolic activity of 3.3 cm right thyroid lobe nodule. Thyroid carcinoma cannot be excluded. Recommend repeat ultrasound guided fine needle aspiration to exclude thyroid carcinoma.  New adjacent hypermetabolic 10 mm right supraclavicular lymph node, suspicious for lymph node metastasis.  Slight increase in size and hypermetabolic activity of solitary right paratracheal lymph node.  No evidence of abdominal or pelvic metastatic disease.   07/18/2017 Procedure   1. Technically successful ultrasound guided fine needle aspiration of indeterminate hypermetabolic right-sided thyroid nodule/mass. 2. Technically successful ultrasound-guided core needle biopsy of hypermetabolic right lower cervical lymph node.   07/18/2017 Pathology Results   THYROID, FINE NEEDLE ASPIRATION RIGHT (SPECIMEN 1 OF 1, COLLECTED ON 07/18/2017): ATYPIA OF UNDETERMINED SIGNIFICANCE OR FOLLICULAR LESION OF UNDETERMINED SIGNIFICANCE (BETHESDA CATEGORY III). SEE COMMENT. COMMENT: THE SPECIMEN CONSISTS OF SMALL AND MEDIUM SIZED GROUPS OF FOLLICULAR EPITHELIAL CELLS WITH MILD CYTOLOGIC ATYPIA INCLUDING NUCLEAR ENLARGEMENT AND HURTHLE CELL CHANGE. SOME GROUPS ARE ARRANGED AS MICROFOLLICLES. THERE IS MINIMAL BACKGROUND COLLOID. BASED ON THESE FEATURES, A FOLLICULAR LESION/NEOPLASM CAN NOT BE ENTIRELY RULED OUT. A SPECIMEN WILL BE SENT FOR AFIRMA TESTING.   07/19/2017 Pathology Results   Lymph node, needle/core biopsy - METASTATIC PAPILLARY SEROUS CARCINOMA. - SEE COMMENT. Microscopic Comment Dr. Valinda Hoar has reviewed the case and concurs with this interpretation   10/15/2017 PET scan   No new or progressive disease. No evidence of abdominal or pelvic metastatic disease.  Stable hypermetabolic right thyroid lobe nodule and adjacent right supraclavicular lymph  node.  Decreased size and hypermetabolic activity of solitary right paratracheal lymph node.   01/23/2018 PET scan   1. Overall, no significant change from PET-CT of 3 months ago. There is persistent hypermetabolic activity at the right thoracic inlet and within a right paratracheal mediastinal node. The nodes have not significantly changed in size, although the metabolic activity has minimally increased over this interval. It is uncertain how much of the thoracic inlet metabolic activity is attributed to the thyroid nodule versus adjacent cervical lymph nodes, both previously biopsied.  2. No new hypermetabolic activity within the neck, chest, abdomen or pelvis. No evidence of local recurrence in the pelvis. 3. Stable probable radiation changes in the right lung.   04/16/2018 PET scan   1. Interval increase in metabolic activity of the RIGHT supraclavicular node and RIGHT lower paratracheal mediastinal node with minimal change in size. Findings consistent with persistent and mildly progressed metastatic adenopathy. 2. New hypermetabolic small lymph node in the RIGHT lower paratracheal nodal station adjacent to the previously followed node. 3. No evidence of local recurrence in the pelvis or new disease elsewhere.   07/30/2018 PET scan   1. Mild interval progression of right supraclavicular, mediastinal, and right hilar hypermetabolic metastatic disease. There is a new hypermetabolic lymph node in the subcarinal station on today's study. 2. No hypermetabolic disease in the abdomen or pelvis to suggest recurrent/metastases. 3.  Aortic Atherosclerois (ICD10-170.0)     08/16/2018 -  Chemotherapy   The patient had Lenvima since 3/6 and pembrolizumab since 3/13 for chemotherapy treatment. Assunta Curtis was held temporarily due to infection and severe hypertension   10/31/2018 PET scan   Partial response to therapy, with decreased hypermetabolic lymphadenopathy in mediastinum and right hilar region.   No  significant change in hypermetabolic lymphadenopathy in right inferior neck.   No new or progressive metastatic disease identified. No evidence of recurrent or metastatic carcinoma in the pelvis or abdomen   02/04/2019 PET scan   1. Decrease in metabolic activity of RIGHT supraclavicular node and RIGHT paratracheal lymph nodes. 2. Persistent activity in the RIGHT thyroid nodule unchanged. 3. Diffuse activity through the esophagus is favored esophagitis. No change. 4. Scattered foci of intense metabolic activity associated the bowel without focal lesion on CT favored benign. 5. No evidence of peritoneal metastasis. 6. No evidence of metastatic adenopathy in the abdomen pelvis. 7. No evidence of local pelvic sidewall recurrence.   07/30/2019 PET scan   1. Mildly increased uptake within small nodes along the right sternocleidomastoid without change in size and associated with increased deltoid activity may reflect changes related to right arm injection or recent COVID vaccination, consider clinical correlation and attention on follow-up. 2. Persistent activity in the right thyroid nodule, unchanged from previous exams. 3. New activity in a juxta esophageal lymph node in the chest is suspicious though the node is unchanged with respect to size. Endoscopic correlation could potentially be helpful or close attention on follow-up. 4. Subtle increased FDG uptake along the left mainstem bronchus and adjacent to the aorta may represent a small lymph node. No discrete correlate is demonstrated on today's study.   10/22/2019 PET scan   1. Persistent FDG avid subcentimeter right supraclavicular and juxta esophageal lymph nodes. The degree of FDG uptake is similar to the previous exam. No new or progressive findings identified. 2. No findings of solid organ metastasis, skeletal metastasis or metastatic disease to the abdomen or pelvis. 3. FDG avid right lobe of thyroid gland nodule is again noted This nodule is  not incidental and been worked up previously with 2 biopsies. Please refer to results from the most recent biopsy dated 07/18/2017. (Ref: J Am Coll Radiol. 2015 Feb;12(2): 143-50). 4.  Aortic Atherosclerosis (ICD10-I70.0).   05/05/2020 PET scan   1. Progressive disease, as evidenced by increased hypermetabolism within right supraclavicular and mediastinal nodes. 2. No infradiaphragmatic hypermetabolic metastasis. 3.  Aortic Atherosclerosis (ICD10-I70.0). 4. Hypermetabolic right thyroid nodule has been detailed on prior exams and was biopsied on 07/18/2017   09/10/2020 PET scan  1. Interval improvement in previously demonstrated hypermetabolic mediastinal and right supraclavicular adenopathy. A single hypermetabolic left paraesophageal lymph node remains, improved from previous study. The other nodes have resolved.  2. No progressive disease. 3. Diffuse esophageal hypermetabolic activity suggesting esophagitis. 4. Hypermetabolic right thyroid nodule has been previously biopsied.   03/18/2021 PET scan   1. No significant change in single residual FDG avid left paraesophageal lymph node. This has an SUV max of 7.03, compared with 6.4 previously. No new sites of disease identified. 2. Diffuse esophageal hypermetabolic activity suggestive of esophagitis. 3. FDG avid right lobe of thyroid gland nodule. This has been biopsied previously. (Ref: J Am Coll Radiol. 2015 Feb;12(2): 143-50).     09/22/2021 PET scan   1. Persistent mild diffuse FDG uptake in the esophagus likely chronic inflammation. 2. Resolution of the left paraesophageal lymph node seen on the prior study. 3. Stable hypermetabolic right thyroid nodule, previously biopsied. 4. No findings for recurrent abdominal/pelvic disease or metastatic disease.     02/23/2022 -  Chemotherapy   Patient is on Treatment Plan : UTERINE Lenvatinib (20) D1-21 + Pembrolizumab (200) D1 q21d     05/04/2022 PET scan   1. Status post hysterectomy without  evidence of hypermetabolic local recurrence or hypermetabolic metastatic disease. 2. Persistent diffuse long segment hypermetabolic esophageal activity, likely reflects chronic esophagitis. Consider further evaluation with endoscopy if not previously performed. 3.  Aortic Atherosclerosis (ICD10-I70.0).     11/09/2022 PET scan   NM PET Image Restage (PS) Skull Base to Thigh (F-18 FDG)  Result Date: 11/09/2022 CLINICAL DATA:  Subsequent treatment strategy for endometrial carcinoma status post hysterectomy. EXAM: NUCLEAR MEDICINE PET SKULL BASE TO THIGH TECHNIQUE: 7.6 mCi F-18 FDG was injected intravenously. Full-ring PET imaging was performed from the skull base to thigh after the radiotracer. CT data was obtained and used for attenuation correction and anatomic localization. Fasting blood glucose: 93 mg/dl COMPARISON:  [id number] FINDINGS: Mediastinal blood pool activity: SUV max 2.44 Liver activity: SUV max NA NECK: No hypermetabolic lymph nodes in the neck. Hypermetabolic right lobe of thyroid gland nodule is again noted with SUV max of 10.86, previously 11.9. Incidental CT findings: None. CHEST: No hypermetabolic mediastinal or hilar nodes. No suspicious pulmonary nodules on the CT scan. Mild diffuse increased radiotracer uptake within the esophagus is again noted with SUV max 5.28. Previously this measured the same. No discrete mass identified. Incidental CT findings: Aortic atherosclerosis. ABDOMEN/PELVIS: No abnormal hypermetabolic activity within the liver, pancreas, adrenal glands, or spleen. No hypermetabolic lymph nodes in the abdomen or pelvis. Incidental CT findings: Status post hysterectomy. Aortic atherosclerotic calcifications. SKELETON: No focal hypermetabolic activity to suggest skeletal metastasis. Incidental CT findings: None. IMPRESSION: 1. No specific findings identified to suggest locally recurrent tumor or metastatic disease status post hysterectomy. 2. Persistent, mild, diffuse increased  radiotracer uptake within the esophagus. These findings are favored to represent chronic esophagitis. 3. No new or progressive disease identified. 4. Persistent hypermetabolic right lobe of thyroid gland nodule. This has been evaluated on previous imaging and biopsy. (Ref: J Am Coll Radiol. 2015 Feb;12(2): 143-50). 5. Aortic Atherosclerosis (ICD10-I70.0). Electronically Signed   By: Signa Kell M.D.   On: 11/09/2022 09:23      05/25/2023 PET scan   1. Stable PET-CT scan. No findings for recurrent or metastatic disease. 2. Stable hypermetabolic right thyroid lobe lesion. 3. Persistent mild diffuse hypermetabolism involving the esophagus. 4. Aortic atherosclerosis.   Aortic Atherosclerosis (ICD10-I70.0).     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 Lenvima was held temporarily due to infection and severe hypertension     PHYSICAL EXAMINATION: ECOG PERFORMANCE STATUS: 0 - Asymptomatic  Vitals:   07/19/23 1020  BP: 132/81  Pulse: 74  Resp: 18  Temp: 97.9 F (36.6 C)  SpO2: 96%   Filed Weights   07/19/23 1020  Weight: 150 lb 9.6 oz (68.3 kg)    GENERAL:alert, no distress and comfortable NEURO: alert & oriented x 3 with fluent speech, no focal motor/sensory deficits  LABORATORY DATA:  I have reviewed the data as listed    Component Value Date/Time   NA 137 07/19/2023 0948   NA 141 05/17/2017 0957   K 4.1 07/19/2023 0948   K 3.6 05/17/2017 0957   CL 102 07/19/2023 0948   CL 105 11/19/2012 0848   CO2 28 07/19/2023 0948   CO2 25 05/17/2017 0957   GLUCOSE 82 07/19/2023 0948   GLUCOSE 85 05/17/2017 0957   GLUCOSE 122 (H) 11/19/2012 0848   BUN 20 07/19/2023 0948   BUN 15.2 05/17/2017 0957   CREATININE 0.84 07/19/2023 0948   CREATININE 0.8 05/17/2017 0957   CALCIUM  9.9 07/19/2023 0948   CALCIUM 9.6 05/17/2017 0957   PROT 7.2 07/19/2023 0948   PROT 7.0 05/17/2017 0957   ALBUMIN 4.0 07/19/2023 0948   ALBUMIN 3.9 05/17/2017 0957   AST 23 07/19/2023 0948   AST 25 05/17/2017 0957   ALT 17 07/19/2023 0948   ALT 18 05/17/2017 0957   ALKPHOS 47 07/19/2023 0948   ALKPHOS 57 05/17/2017 0957   BILITOT 0.5 07/19/2023 0948   BILITOT 0.47 05/17/2017 0957   GFRNONAA >60 07/19/2023 0948   GFRAA >60 02/26/2020 0940   GFRAA >60 07/10/2019 1240    No results found for: "SPEP", "UPEP"  Lab Results  Component Value Date   WBC 4.0 07/19/2023   NEUTROABS 2.4 07/19/2023   HGB 10.6 (L) 07/19/2023   HCT 33.2 (L) 07/19/2023   MCV 97.6 07/19/2023   PLT 221 07/19/2023      Chemistry      Component Value Date/Time   NA 137 07/19/2023 0948   NA 141 05/17/2017 0957   K 4.1 07/19/2023 0948   K 3.6 05/17/2017 0957   CL 102 07/19/2023 0948   CL 105 11/19/2012 0848   CO2 28 07/19/2023 0948   CO2 25 05/17/2017 0957   BUN 20 07/19/2023 0948   BUN 15.2 05/17/2017 0957   CREATININE 0.84 07/19/2023 0948   CREATININE 0.8 05/17/2017 0957   GLU 158 (H) 01/14/2015 1035      Component Value Date/Time   CALCIUM 9.9 07/19/2023 0948   CALCIUM 9.6 05/17/2017 0957   ALKPHOS 47 07/19/2023 0948   ALKPHOS 57 05/17/2017 0957   AST 23 07/19/2023 0948   AST 25 05/17/2017 0957   ALT 17 07/19/2023 0948   ALT 18 05/17/2017 0957   BILITOT 0.5 07/19/2023 0948   BILITOT 0.47 05/17/2017 0957

## 2023-07-19 NOTE — Assessment & Plan Note (Signed)
She had intermittent pancytopenia She will receive vitamin B12 injection monthly Monitor closely

## 2023-07-19 NOTE — Assessment & Plan Note (Signed)
PET/CT imaging from December showed no evidence of active disease Per previous discussion, our plan would be to continue treatment until April of this year before we decide whether we should discontinue treatment or not Continue supportive care

## 2023-07-20 ENCOUNTER — Telehealth: Payer: Self-pay | Admitting: Hematology and Oncology

## 2023-07-20 NOTE — Telephone Encounter (Signed)
Left patient a vm regarding upcoming appointment

## 2023-07-21 ENCOUNTER — Other Ambulatory Visit: Payer: Self-pay

## 2023-07-31 ENCOUNTER — Other Ambulatory Visit (HOSPITAL_COMMUNITY): Payer: Self-pay

## 2023-08-11 ENCOUNTER — Other Ambulatory Visit (HOSPITAL_COMMUNITY): Payer: Self-pay

## 2023-08-13 ENCOUNTER — Other Ambulatory Visit: Payer: Self-pay

## 2023-08-13 ENCOUNTER — Other Ambulatory Visit (HOSPITAL_COMMUNITY): Payer: Self-pay

## 2023-08-14 DIAGNOSIS — F411 Generalized anxiety disorder: Secondary | ICD-10-CM | POA: Diagnosis not present

## 2023-08-14 DIAGNOSIS — I10 Essential (primary) hypertension: Secondary | ICD-10-CM | POA: Diagnosis not present

## 2023-08-14 DIAGNOSIS — Z6827 Body mass index (BMI) 27.0-27.9, adult: Secondary | ICD-10-CM | POA: Diagnosis not present

## 2023-08-14 DIAGNOSIS — E559 Vitamin D deficiency, unspecified: Secondary | ICD-10-CM | POA: Diagnosis not present

## 2023-08-14 DIAGNOSIS — C770 Secondary and unspecified malignant neoplasm of lymph nodes of head, face and neck: Secondary | ICD-10-CM | POA: Diagnosis not present

## 2023-08-14 DIAGNOSIS — M859 Disorder of bone density and structure, unspecified: Secondary | ICD-10-CM | POA: Diagnosis not present

## 2023-08-14 DIAGNOSIS — E7800 Pure hypercholesterolemia, unspecified: Secondary | ICD-10-CM | POA: Diagnosis not present

## 2023-08-14 DIAGNOSIS — Z0000 Encounter for general adult medical examination without abnormal findings: Secondary | ICD-10-CM | POA: Diagnosis not present

## 2023-08-14 DIAGNOSIS — E039 Hypothyroidism, unspecified: Secondary | ICD-10-CM | POA: Diagnosis not present

## 2023-08-14 DIAGNOSIS — I700 Atherosclerosis of aorta: Secondary | ICD-10-CM | POA: Diagnosis not present

## 2023-08-21 ENCOUNTER — Other Ambulatory Visit: Payer: Self-pay

## 2023-08-21 NOTE — Progress Notes (Signed)
 Specialty Pharmacy Refill Coordination Note  Diane Lozano is a 68 y.o. female contacted today regarding refills of specialty medication(s) Lenvatinib Mesylate [facility])   Patient requested (Patient-Rptd) Delivery   Delivery date: (Patient-Rptd) 09/04/23   Verified address: (Patient-Rptd) [address]   Medication will be filled on 09/03/23.

## 2023-08-30 ENCOUNTER — Inpatient Hospital Stay (HOSPITAL_BASED_OUTPATIENT_CLINIC_OR_DEPARTMENT_OTHER): Payer: 59 | Admitting: Hematology and Oncology

## 2023-08-30 ENCOUNTER — Inpatient Hospital Stay: Payer: 59

## 2023-08-30 ENCOUNTER — Inpatient Hospital Stay: Payer: 59 | Attending: Hematology and Oncology

## 2023-08-30 VITALS — BP 130/76 | HR 80 | Resp 18 | Ht 64.0 in | Wt 148.6 lb

## 2023-08-30 DIAGNOSIS — E538 Deficiency of other specified B group vitamins: Secondary | ICD-10-CM | POA: Insufficient documentation

## 2023-08-30 DIAGNOSIS — Z5112 Encounter for antineoplastic immunotherapy: Secondary | ICD-10-CM | POA: Insufficient documentation

## 2023-08-30 DIAGNOSIS — Z79620 Long term (current) use of immunosuppressive biologic: Secondary | ICD-10-CM | POA: Insufficient documentation

## 2023-08-30 DIAGNOSIS — Z95828 Presence of other vascular implants and grafts: Secondary | ICD-10-CM

## 2023-08-30 DIAGNOSIS — C541 Malignant neoplasm of endometrium: Secondary | ICD-10-CM | POA: Insufficient documentation

## 2023-08-30 DIAGNOSIS — E039 Hypothyroidism, unspecified: Secondary | ICD-10-CM

## 2023-08-30 DIAGNOSIS — I10 Essential (primary) hypertension: Secondary | ICD-10-CM

## 2023-08-30 LAB — TSH: TSH: 2.141 u[IU]/mL (ref 0.350–4.500)

## 2023-08-30 LAB — CBC WITH DIFFERENTIAL (CANCER CENTER ONLY)
Abs Immature Granulocytes: 0.01 10*3/uL (ref 0.00–0.07)
Basophils Absolute: 0.1 10*3/uL (ref 0.0–0.1)
Basophils Relative: 1 %
Eosinophils Absolute: 0.2 10*3/uL (ref 0.0–0.5)
Eosinophils Relative: 4 %
HCT: 34.3 % — ABNORMAL LOW (ref 36.0–46.0)
Hemoglobin: 11 g/dL — ABNORMAL LOW (ref 12.0–15.0)
Immature Granulocytes: 0 %
Lymphocytes Relative: 19 %
Lymphs Abs: 1.1 10*3/uL (ref 0.7–4.0)
MCH: 30.7 pg (ref 26.0–34.0)
MCHC: 32.1 g/dL (ref 30.0–36.0)
MCV: 95.8 fL (ref 80.0–100.0)
Monocytes Absolute: 0.4 10*3/uL (ref 0.1–1.0)
Monocytes Relative: 6 %
Neutro Abs: 4 10*3/uL (ref 1.7–7.7)
Neutrophils Relative %: 70 %
Platelet Count: 236 10*3/uL (ref 150–400)
RBC: 3.58 MIL/uL — ABNORMAL LOW (ref 3.87–5.11)
RDW: 13.5 % (ref 11.5–15.5)
WBC Count: 5.7 10*3/uL (ref 4.0–10.5)
nRBC: 0 % (ref 0.0–0.2)

## 2023-08-30 LAB — CMP (CANCER CENTER ONLY)
ALT: 19 U/L (ref 0–44)
AST: 25 U/L (ref 15–41)
Albumin: 4.3 g/dL (ref 3.5–5.0)
Alkaline Phosphatase: 50 U/L (ref 38–126)
Anion gap: 7 (ref 5–15)
BUN: 24 mg/dL — ABNORMAL HIGH (ref 8–23)
CO2: 26 mmol/L (ref 22–32)
Calcium: 9.6 mg/dL (ref 8.9–10.3)
Chloride: 101 mmol/L (ref 98–111)
Creatinine: 1 mg/dL (ref 0.44–1.00)
GFR, Estimated: >60 mL/min (ref >60)
Glucose, Bld: 87 mg/dL (ref 70–99)
Potassium: 4.3 mmol/L (ref 3.5–5.1)
Sodium: 134 mmol/L — ABNORMAL LOW (ref 135–145)
Total Bilirubin: 0.5 mg/dL (ref 0.0–1.2)
Total Protein: 7.7 g/dL (ref 6.5–8.1)

## 2023-08-30 LAB — TOTAL PROTEIN, URINE DIPSTICK: Protein, ur: NEGATIVE mg/dL

## 2023-08-30 MED ORDER — SODIUM CHLORIDE 0.9 % IV SOLN: Freq: Once | INTRAVENOUS | Status: AC

## 2023-08-30 MED ORDER — SODIUM CHLORIDE 0.9% FLUSH
10.0000 mL | Freq: Once | INTRAVENOUS | Status: AC
  Administered 2023-08-30: 10 mL

## 2023-08-30 MED ORDER — SODIUM CHLORIDE 0.9% FLUSH
10.0000 mL | INTRAVENOUS | Status: DC | PRN
  Administered 2023-08-30: 10 mL

## 2023-08-30 MED ORDER — HEPARIN SOD (PORK) LOCK FLUSH 100 UNIT/ML IV SOLN
500.0000 [IU] | Freq: Once | INTRAVENOUS | Status: AC | PRN
Start: 2023-08-30 — End: 2023-08-30
  Administered 2023-08-30: 500 [IU]

## 2023-08-30 MED ORDER — SODIUM CHLORIDE 0.9 % IV SOLN
400.0000 mg | Freq: Once | INTRAVENOUS | Status: AC
  Administered 2023-08-30: 400 mg via INTRAVENOUS
  Filled 2023-08-30: qty 16

## 2023-08-30 NOTE — Assessment & Plan Note (Addendum)
 She was originally diagnosed with uterine cancer in 2014, early stage I disease but with high-grade serous She completed adjuvant treatment with 6 cycles of carboplatin and paclitaxel in 2014 along with radiation treatment  She is noted to have recurrent metastatic disease in her chest in 2016  Pathology: from 10/18, ER/Her2 neg. Foundation One testing done 11-2015 on surgical path from 2014:    MS stable, TMB low  4 muts/mb  She had recurrent disease and was treated with carboplatin, paclitaxel, Megace, tamoxifen, Doxil and most recently with combination of lenvatinib and pembrolizumab since 2020 She achieved complete response in April 2023 and has been on maintenance treatment since then  Overall, she tolerated treatment well without major side effects We will proceed with treatment as scheduled I plan to repeat imaging study next month If she continues to have complete response on imaging study, we will consider whether she wants to continue on treatment indefinitely versus treatment break

## 2023-08-30 NOTE — Assessment & Plan Note (Addendum)
She has intermittent elevated TSH I will adjust her thyroid medicine accordingly 

## 2023-08-30 NOTE — Progress Notes (Signed)
 [facility] OFFICE PROGRESS NOTE  Patient Care Team: Sigmund Hazel, MD as PCP - General (Family Medicine) Adrian Prince, MD as Consulting Physician (Endocrinology)  Assessment & Plan Endometrial cancer [facility]) She was originally diagnosed with uterine cancer in 2014, early stage I disease but with high-grade serous She completed adjuvant treatment with 6 cycles of carboplatin and paclitaxel in 2014 along with radiation treatment  She is noted to have recurrent metastatic disease in her chest in 2016  Pathology: from 10/18, ER/Her2 neg. Foundation One testing done 11-2015 on surgical path from 2014:    MS stable, TMB low  4 muts/mb  She had recurrent disease and was treated with carboplatin, paclitaxel, Megace, tamoxifen, Doxil and most recently with combination of lenvatinib and pembrolizumab since 2020 She achieved complete response in April 2023 and has been on maintenance treatment since then  Overall, she tolerated treatment well without major side effects We will proceed with treatment as scheduled I plan to repeat imaging study next month If she continues to have complete response on imaging study, we will consider whether she wants to continue on treatment indefinitely versus treatment break Vitamin B12 deficiency She will continue monthly vitamin B12 injection Last B12 level in January was adequate Acquired hypothyroidism She has intermittent elevated TSH I will adjust her thyroid medicine accordingly  Orders Placed This Encounter  Procedures   NM PET Image Restage (PS) Skull Base to Thigh (F-18 FDG)    Standing Status:   Future    Expected Date:   09/28/2023    Expiration Date:   08/29/2024    If indicated for the ordered procedure, I authorize the administration of a radiopharmaceutical per Radiology protocol:   Yes    Preferred imaging location?:   [facility]    Radiology Contrast Protocol - do NOT remove file path:    [email or web address]     Artis Delay, MD  INTERVAL HISTORY: she returns for treatment follow-up Complications related to previous cycle of chemotherapy included none  PHYSICAL EXAMINATION: ECOG PERFORMANCE STATUS: 0 - Asymptomatic  Vitals:   08/30/23 1159  BP: 130/76  Pulse: 80  Resp: 18  SpO2: 99%   Filed Weights   08/30/23 1159  Weight: 148 lb 9.6 oz (67.4 kg)    Relevant data reviewed during this visit included CBC and CMP

## 2023-08-30 NOTE — Assessment & Plan Note (Addendum)
She will continue monthly vitamin B12 injection Last B12 level in January was adequate

## 2023-08-30 NOTE — Patient Instructions (Signed)

## 2023-08-31 ENCOUNTER — Other Ambulatory Visit: Payer: Self-pay

## 2023-08-31 DIAGNOSIS — Z0100 Encounter for examination of eyes and vision without abnormal findings: Secondary | ICD-10-CM | POA: Diagnosis not present

## 2023-09-06 ENCOUNTER — Other Ambulatory Visit (HOSPITAL_COMMUNITY): Payer: Self-pay

## 2023-09-11 ENCOUNTER — Other Ambulatory Visit: Payer: Self-pay | Admitting: Hematology and Oncology

## 2023-09-11 ENCOUNTER — Other Ambulatory Visit (HOSPITAL_COMMUNITY): Payer: Self-pay

## 2023-09-11 ENCOUNTER — Other Ambulatory Visit: Payer: Self-pay

## 2023-09-11 DIAGNOSIS — I10 Essential (primary) hypertension: Secondary | ICD-10-CM | POA: Diagnosis not present

## 2023-09-11 DIAGNOSIS — K219 Gastro-esophageal reflux disease without esophagitis: Secondary | ICD-10-CM | POA: Diagnosis not present

## 2023-09-11 DIAGNOSIS — E041 Nontoxic single thyroid nodule: Secondary | ICD-10-CM | POA: Diagnosis not present

## 2023-09-11 DIAGNOSIS — M8580 Other specified disorders of bone density and structure, unspecified site: Secondary | ICD-10-CM | POA: Diagnosis not present

## 2023-09-11 DIAGNOSIS — C541 Malignant neoplasm of endometrium: Secondary | ICD-10-CM | POA: Diagnosis not present

## 2023-09-11 DIAGNOSIS — D61810 Antineoplastic chemotherapy induced pancytopenia: Secondary | ICD-10-CM | POA: Diagnosis not present

## 2023-09-11 DIAGNOSIS — E785 Hyperlipidemia, unspecified: Secondary | ICD-10-CM | POA: Diagnosis not present

## 2023-09-11 DIAGNOSIS — I700 Atherosclerosis of aorta: Secondary | ICD-10-CM | POA: Diagnosis not present

## 2023-09-11 MED ORDER — AMLODIPINE BESYLATE 10 MG PO TABS
10.0000 mg | ORAL_TABLET | Freq: Every day | ORAL | 1 refills | Status: DC
  Filled 2023-09-11: qty 90, 90d supply, fill #0

## 2023-09-26 ENCOUNTER — Other Ambulatory Visit: Payer: Self-pay | Admitting: Hematology and Oncology

## 2023-09-26 ENCOUNTER — Other Ambulatory Visit (HOSPITAL_COMMUNITY): Payer: Self-pay

## 2023-09-26 ENCOUNTER — Other Ambulatory Visit: Payer: Self-pay

## 2023-09-26 MED ORDER — LOSARTAN POTASSIUM 25 MG PO TABS
25.0000 mg | ORAL_TABLET | Freq: Every day | ORAL | 3 refills | Status: AC
  Filled 2023-09-26 – 2023-11-13 (×2): qty 90, 90d supply, fill #0
  Filled 2024-02-11: qty 90, 90d supply, fill #1
  Filled 2024-03-03 – 2024-05-07 (×4): qty 90, 90d supply, fill #2

## 2023-09-27 ENCOUNTER — Other Ambulatory Visit (HOSPITAL_COMMUNITY): Payer: Self-pay

## 2023-09-27 NOTE — Progress Notes (Signed)
 Specialty Pharmacy Refill Coordination Note  Diane Lozano is a 68 y.o. female contacted today regarding refills of specialty medication(s) Lenvatinib  Mesylate (LENVIMA )   Patient requested Delivery   Delivery date: 10/11/23   Verified address: [address]   Medication will be filled on 10/10/23.

## 2023-09-27 NOTE — Progress Notes (Signed)
 Specialty Pharmacy Ongoing Clinical Assessment Note  Diane Lozano is a 68 y.o. female who is being followed by the specialty pharmacy service for RxSp Oncology   Patient's specialty medication(s) reviewed today: Lenvatinib  Mesylate (LENVIMA )   Missed doses in the last 4 weeks: 2   Patient/Caregiver did not have any additional questions or concerns.   Therapeutic benefit summary: Patient is achieving benefit   Adverse events/side effects summary: No adverse events/side effects   Patient's therapy is appropriate to: Continue    Goals Addressed             This Visit's Progress    Slow Disease Progression   On track    Patient is on track. Patient will maintain adherence. Per provider notes from 10/10 last PET scan was normal.          Follow up:  6 months  Malachi Screws Specialty Pharmacist

## 2023-09-28 ENCOUNTER — Encounter (HOSPITAL_COMMUNITY)
Admission: RE | Admit: 2023-09-28 | Discharge: 2023-09-28 | Disposition: A | Discharge status: Home / Self Care | Source: Ambulatory Visit | Attending: Hematology and Oncology | Admitting: Hematology and Oncology

## 2023-09-28 DIAGNOSIS — E041 Nontoxic single thyroid nodule: Secondary | ICD-10-CM | POA: Diagnosis not present

## 2023-09-28 DIAGNOSIS — K449 Diaphragmatic hernia without obstruction or gangrene: Secondary | ICD-10-CM | POA: Diagnosis not present

## 2023-09-28 DIAGNOSIS — C541 Malignant neoplasm of endometrium: Secondary | ICD-10-CM | POA: Diagnosis present

## 2023-09-28 DIAGNOSIS — C799 Secondary malignant neoplasm of unspecified site: Secondary | ICD-10-CM | POA: Insufficient documentation

## 2023-09-28 LAB — GLUCOSE, CAPILLARY: Glucose-Capillary: 82 mg/dL (ref 70–99)

## 2023-09-28 MED ORDER — FLUDEOXYGLUCOSE F - 18 (FDG) INJECTION
7.4300 | Freq: Once | INTRAVENOUS | Status: AC | PRN
  Administered 2023-09-28: 7.43 via INTRAVENOUS

## 2023-10-04 ENCOUNTER — Inpatient Hospital Stay: Attending: Hematology and Oncology | Admitting: Hematology and Oncology

## 2023-10-04 ENCOUNTER — Encounter: Payer: Self-pay | Admitting: Hematology and Oncology

## 2023-10-04 VITALS — BP 156/88 | HR 114 | Resp 18 | Ht 64.0 in | Wt 146.8 lb

## 2023-10-04 DIAGNOSIS — Z923 Personal history of irradiation: Secondary | ICD-10-CM | POA: Insufficient documentation

## 2023-10-04 DIAGNOSIS — E039 Hypothyroidism, unspecified: Secondary | ICD-10-CM | POA: Insufficient documentation

## 2023-10-04 DIAGNOSIS — Z5112 Encounter for antineoplastic immunotherapy: Secondary | ICD-10-CM | POA: Diagnosis not present

## 2023-10-04 DIAGNOSIS — I10 Essential (primary) hypertension: Secondary | ICD-10-CM | POA: Insufficient documentation

## 2023-10-04 DIAGNOSIS — Z79620 Long term (current) use of immunosuppressive biologic: Secondary | ICD-10-CM | POA: Insufficient documentation

## 2023-10-04 DIAGNOSIS — C541 Malignant neoplasm of endometrium: Secondary | ICD-10-CM | POA: Insufficient documentation

## 2023-10-04 NOTE — Progress Notes (Signed)
 [facility] OFFICE PROGRESS NOTE  Patient Care Team: Perley Bradley, MD as PCP - General (Family Medicine) Rosslyn Coons, MD as Consulting Physician (Endocrinology)  Assessment & Plan Endometrial cancer [facility]) She was originally diagnosed with uterine cancer in 2014, early stage I disease but with high-grade serous She completed adjuvant treatment with 6 cycles of carboplatin  and paclitaxel  in 2014 along with radiation treatment  She is noted to have recurrent metastatic disease in her chest in 2016  Pathology: from 10/18, ER/Her2 neg. Foundation One testing done 11-2015 on surgical path from 2014:    MS stable, TMB low  4 muts/mb  She had recurrent disease and was treated with carboplatin , paclitaxel , Megace , tamoxifen , Doxil  and most recently with combination of lenvatinib  and pembrolizumab  since 2020 She achieved complete response in April 2023 and has been on maintenance treatment since then  Overall, she tolerated treatment well without major side effects I reviewed multiple imaging studies with the patient which show no changes/no evidence of cancer recurrence We have been discussing about risk and benefits of discontinuation of treatment but for now, she wants to continue on maintenance therapy I will see her again in of the month for further follow-up  Orders Placed This Encounter  Procedures   CBC with Differential (Cancer Center Only)    Standing Status:   Future    Expected Date:   10/25/2023    Expiration Date:   10/24/2024   CMP (Cancer Center only)    Standing Status:   Future    Expected Date:   10/25/2023    Expiration Date:   10/24/2024   CBC with Differential (Cancer Center Only)    Standing Status:   Future    Expected Date:   12/06/2023    Expiration Date:   12/05/2024   CMP (Cancer Center only)    Standing Status:   Future    Expected Date:   12/06/2023    Expiration Date:   12/05/2024   CBC with Differential (Cancer Center Only)    Standing Status:    Future    Expected Date:   01/17/2024    Expiration Date:   01/16/2025   CMP (Cancer Center only)    Standing Status:   Future    Expected Date:   01/17/2024    Expiration Date:   01/16/2025     Almeda Jacobs, MD  INTERVAL HISTORY: she returns for surveillance follow-up and review of PET/CT imaging The patient is not ready to stop treatment yet We spent majority of our discussion on reviewing imaging  PHYSICAL EXAMINATION: ECOG PERFORMANCE STATUS: 0 - Asymptomatic  Vitals:   10/04/23 1044  BP: (!) 156/88  Pulse: (!) 114  Resp: 18  SpO2: 97%   Filed Weights   10/04/23 1044  Weight: 146 lb 12.8 oz (66.6 kg)    Relevant data reviewed during this visit included PET/CT imaging from April 2025

## 2023-10-04 NOTE — Assessment & Plan Note (Addendum)
 She was originally diagnosed with uterine cancer in 2014, early stage I disease but with high-grade serous She completed adjuvant treatment with 6 cycles of carboplatin  and paclitaxel  in 2014 along with radiation treatment  She is noted to have recurrent metastatic disease in her chest in 2016  Pathology: from 10/18, ER/Her2 neg. Foundation One testing done 11-2015 on surgical path from 2014:    MS stable, TMB low  4 muts/mb  She had recurrent disease and was treated with carboplatin , paclitaxel , Megace , tamoxifen , Doxil  and most recently with combination of lenvatinib  and pembrolizumab  since 2020 She achieved complete response in April 2023 and has been on maintenance treatment since then  Overall, she tolerated treatment well without major side effects I reviewed multiple imaging studies with the patient which show no changes/no evidence of cancer recurrence We have been discussing about risk and benefits of discontinuation of treatment but for now, she wants to continue on maintenance therapy I will see her again in of the month for further follow-up

## 2023-10-05 ENCOUNTER — Other Ambulatory Visit (HOSPITAL_COMMUNITY): Payer: Self-pay

## 2023-10-05 ENCOUNTER — Other Ambulatory Visit: Payer: Self-pay

## 2023-10-05 DIAGNOSIS — F419 Anxiety disorder, unspecified: Secondary | ICD-10-CM | POA: Diagnosis not present

## 2023-10-05 DIAGNOSIS — F4320 Adjustment disorder, unspecified: Secondary | ICD-10-CM | POA: Diagnosis not present

## 2023-10-05 MED ORDER — BUSPIRONE HCL 10 MG PO TABS
10.0000 mg | ORAL_TABLET | Freq: Two times a day (BID) | ORAL | 3 refills | Status: AC
  Filled 2024-01-11: qty 180, 90d supply, fill #0
  Filled 2024-04-08: qty 180, 90d supply, fill #1

## 2023-10-05 MED ORDER — VENLAFAXINE HCL ER 150 MG PO CP24
300.0000 mg | ORAL_CAPSULE | Freq: Every day | ORAL | 3 refills | Status: AC
  Filled 2023-10-26 – 2023-11-13 (×2): qty 180, 90d supply, fill #0
  Filled 2024-02-11: qty 180, 90d supply, fill #1
  Filled 2024-05-07: qty 180, 90d supply, fill #2
  Filled 2024-07-02: qty 180, 90d supply, fill #3

## 2023-10-09 ENCOUNTER — Other Ambulatory Visit: Payer: Self-pay

## 2023-10-10 ENCOUNTER — Other Ambulatory Visit: Payer: Self-pay

## 2023-10-18 ENCOUNTER — Other Ambulatory Visit: Payer: Self-pay

## 2023-10-25 ENCOUNTER — Encounter: Payer: Self-pay | Admitting: Hematology and Oncology

## 2023-10-25 ENCOUNTER — Other Ambulatory Visit: Payer: Self-pay

## 2023-10-25 ENCOUNTER — Inpatient Hospital Stay

## 2023-10-25 ENCOUNTER — Inpatient Hospital Stay (HOSPITAL_BASED_OUTPATIENT_CLINIC_OR_DEPARTMENT_OTHER): Admitting: Hematology and Oncology

## 2023-10-25 VITALS — BP 138/72 | HR 75 | Temp 98.4°F | Resp 18 | Wt 148.6 lb

## 2023-10-25 DIAGNOSIS — Z95828 Presence of other vascular implants and grafts: Secondary | ICD-10-CM

## 2023-10-25 DIAGNOSIS — I10 Essential (primary) hypertension: Secondary | ICD-10-CM | POA: Diagnosis not present

## 2023-10-25 DIAGNOSIS — C541 Malignant neoplasm of endometrium: Secondary | ICD-10-CM

## 2023-10-25 DIAGNOSIS — E039 Hypothyroidism, unspecified: Secondary | ICD-10-CM

## 2023-10-25 DIAGNOSIS — Z79620 Long term (current) use of immunosuppressive biologic: Secondary | ICD-10-CM | POA: Diagnosis not present

## 2023-10-25 DIAGNOSIS — Z5112 Encounter for antineoplastic immunotherapy: Secondary | ICD-10-CM | POA: Diagnosis not present

## 2023-10-25 DIAGNOSIS — Z923 Personal history of irradiation: Secondary | ICD-10-CM | POA: Diagnosis not present

## 2023-10-25 LAB — CBC WITH DIFFERENTIAL (CANCER CENTER ONLY)
Abs Immature Granulocytes: 0 10*3/uL (ref 0.00–0.07)
Basophils Absolute: 0 10*3/uL (ref 0.0–0.1)
Basophils Relative: 1 %
Eosinophils Absolute: 0.2 10*3/uL (ref 0.0–0.5)
Eosinophils Relative: 6 %
HCT: 32.5 % — ABNORMAL LOW (ref 36.0–46.0)
Hemoglobin: 10.8 g/dL — ABNORMAL LOW (ref 12.0–15.0)
Immature Granulocytes: 0 %
Lymphocytes Relative: 16 %
Lymphs Abs: 0.6 10*3/uL — ABNORMAL LOW (ref 0.7–4.0)
MCH: 31.6 pg (ref 26.0–34.0)
MCHC: 33.2 g/dL (ref 30.0–36.0)
MCV: 95 fL (ref 80.0–100.0)
Monocytes Absolute: 0.3 10*3/uL (ref 0.1–1.0)
Monocytes Relative: 8 %
Neutro Abs: 2.9 10*3/uL (ref 1.7–7.7)
Neutrophils Relative %: 69 %
Platelet Count: 198 10*3/uL (ref 150–400)
RBC: 3.42 MIL/uL — ABNORMAL LOW (ref 3.87–5.11)
RDW: 13.5 % (ref 11.5–15.5)
WBC Count: 4.1 10*3/uL (ref 4.0–10.5)
nRBC: 0 % (ref 0.0–0.2)

## 2023-10-25 LAB — CMP (CANCER CENTER ONLY)
ALT: 16 U/L (ref 0–44)
AST: 23 U/L (ref 15–41)
Albumin: 4 g/dL (ref 3.5–5.0)
Alkaline Phosphatase: 46 U/L (ref 38–126)
Anion gap: 7 (ref 5–15)
BUN: 24 mg/dL — ABNORMAL HIGH (ref 8–23)
CO2: 28 mmol/L (ref 22–32)
Calcium: 9.7 mg/dL (ref 8.9–10.3)
Chloride: 103 mmol/L (ref 98–111)
Creatinine: 0.89 mg/dL (ref 0.44–1.00)
GFR, Estimated: >60 mL/min (ref >60)
Glucose, Bld: 92 mg/dL (ref 70–99)
Potassium: 3.8 mmol/L (ref 3.5–5.1)
Sodium: 138 mmol/L (ref 135–145)
Total Bilirubin: 0.6 mg/dL (ref 0.0–1.2)
Total Protein: 7 g/dL (ref 6.5–8.1)

## 2023-10-25 LAB — TSH: TSH: 4.21 u[IU]/mL (ref 0.350–4.500)

## 2023-10-25 LAB — TOTAL PROTEIN, URINE DIPSTICK: Protein, ur: NEGATIVE mg/dL

## 2023-10-25 MED ORDER — SODIUM CHLORIDE 0.9% FLUSH
10.0000 mL | INTRAVENOUS | Status: DC | PRN
  Administered 2023-10-25: 10 mL

## 2023-10-25 MED ORDER — SODIUM CHLORIDE 0.9 % IV SOLN
Freq: Once | INTRAVENOUS | Status: AC
Start: 2023-10-25 — End: 2023-10-25

## 2023-10-25 MED ORDER — SODIUM CHLORIDE 0.9% FLUSH
10.0000 mL | Freq: Once | INTRAVENOUS | Status: AC
  Administered 2023-10-25: 10 mL

## 2023-10-25 MED ORDER — SODIUM CHLORIDE 0.9 % IV SOLN
400.0000 mg | Freq: Once | INTRAVENOUS | Status: AC
  Administered 2023-10-25: 400 mg via INTRAVENOUS
  Filled 2023-10-25: qty 16

## 2023-10-25 MED ORDER — HEPARIN SOD (PORK) LOCK FLUSH 100 UNIT/ML IV SOLN
500.0000 [IU] | Freq: Once | INTRAVENOUS | Status: AC | PRN
  Administered 2023-10-25: 500 [IU]

## 2023-10-25 NOTE — Assessment & Plan Note (Addendum)
 She was originally diagnosed with uterine cancer in 2014, early stage I disease but with high-grade serous She completed adjuvant treatment with 6 cycles of carboplatin  and paclitaxel  in 2014 along with radiation treatment  She is noted to have recurrent metastatic disease in her chest in 2016  Pathology: from 10/18, ER/Her2 neg. Foundation One testing done 11-2015 on surgical path from 2014:    MS stable, TMB low  4 muts/mb  She had recurrent disease and was treated with carboplatin , paclitaxel , Megace , tamoxifen , Doxil  and most recently with combination of lenvatinib  and pembrolizumab  since 2020 She achieved complete response in April 2023 and has been on maintenance treatment since then  Overall, she tolerated treatment well without major side effects Last imaging study in April 2025 showed no evidence of disease Ultimately, she made informed decision to stop treatment after today's infusion We discussed what to expect after discontinuation of treatment I plan to see her again in 8 weeks for port flush maintenance and blood work monitoring Plan to repeat imaging study in October I will see her again in of the month for further follow-up

## 2023-10-25 NOTE — Assessment & Plan Note (Addendum)
 Blood pressure is normal and urine protein is normal Anticipate improvement of blood pressure control after discontinuation of Lenvima  We discussed future medication taper

## 2023-10-25 NOTE — Progress Notes (Signed)
 Received notification from MD that patient has decided to stop treatment with Lenvima . Patient has been disenrolled from [employer] and medication has been removed from patient list.    Hansel Ley, CPhT Pharmacy Technician Coordinator [facility] [phone] (Ph) 10/25/2023 11:02 AM

## 2023-10-25 NOTE — Progress Notes (Signed)
 Diane Lozano OFFICE PROGRESS NOTE  Patient Care Team: Perley Bradley, MD as PCP - General (Family Medicine) Rosslyn Coons, MD as Consulting Physician (Endocrinology)  Assessment & Plan Endometrial cancer [facility]) She was originally diagnosed with uterine cancer in 2014, early stage I disease but with high-grade serous She completed adjuvant treatment with 6 cycles of carboplatin  and paclitaxel  in 2014 along with radiation treatment  She is noted to have recurrent metastatic disease in her chest in 2016  Pathology: from 10/18, ER/Her2 neg. Foundation One testing done 11-2015 on surgical path from 2014:    MS stable, TMB low  4 muts/mb  She had recurrent disease and was treated with carboplatin , paclitaxel , Megace , tamoxifen , Doxil  and most recently with combination of lenvatinib  and pembrolizumab  since 2020 She achieved complete response in April 2023 and has been on maintenance treatment since then  Overall, she tolerated treatment well without major side effects Last imaging study in April 2025 showed no evidence of disease Ultimately, she made informed decision to stop treatment after today's infusion We discussed what to expect after discontinuation of treatment I plan to see her again in 8 weeks for port flush maintenance and blood work monitoring Plan to repeat imaging study in October I will see her again in of the month for further follow-up Essential hypertension Blood pressure is normal and urine protein is normal Anticipate improvement of blood pressure control after discontinuation of Lenvima  We discussed future medication taper Acquired hypothyroidism She has intermittent elevated TSH I will adjust her thyroid  medicine accordingly I anticipate changes in her thyroid  function after discontinuation of pembrolizumab   Orders Placed This Encounter  Procedures   Comprehensive metabolic panel with GFR    Standing Status:   Standing    Number of Occurrences:   33     Expiration Date:   10/24/2024   CBC with Differential/Platelet    Standing Status:   Standing    Number of Occurrences:   22    Expiration Date:   10/24/2024   TSH    Standing Status:   Standing    Number of Occurrences:   22    Expiration Date:   10/24/2024     Almeda Jacobs, MD  INTERVAL HISTORY: she returns for treatment follow-up Complications related to previous cycle of chemotherapy included none We discussed risk and benefits of discontinuation of treatment We discussed changes in blood pressure, thyroid  function test and future imaging  PHYSICAL EXAMINATION: ECOG PERFORMANCE STATUS: 0 - Asymptomatic  Lab Results  Component Value Date   CAN125 8.6 03/13/2017   CAN125 6.3 12/14/2016   CAN125 6.2 09/14/2016      Latest Ref Rng & Units 10/25/2023   10:13 AM 08/30/2023   11:26 AM 07/19/2023    9:48 AM  CBC  WBC 4.0 - 10.5 K/uL 4.1  5.7  4.0   Hemoglobin 12.0 - 15.0 g/dL 30.8  65.7  84.6   Hematocrit 36.0 - 46.0 % 32.5  34.3  33.2   Platelets 150 - 400 K/uL 198  236  221       Chemistry      Component Value Date/Time   NA 134 (L) 08/30/2023 1126   NA 141 05/17/2017 0957   K 4.3 08/30/2023 1126   K 3.6 05/17/2017 0957   CL 101 08/30/2023 1126   CL 105 11/19/2012 0848   CO2 26 08/30/2023 1126   CO2 25 05/17/2017 0957   BUN 24 (H) 08/30/2023 1126   BUN 15.2  05/17/2017 0957   CREATININE 1.00 08/30/2023 1126   CREATININE 0.8 05/17/2017 0957   GLU 158 (H) 01/14/2015 1035      Component Value Date/Time   CALCIUM 9.6 08/30/2023 1126   CALCIUM 9.6 05/17/2017 0957   ALKPHOS 50 08/30/2023 1126   ALKPHOS 57 05/17/2017 0957   AST 25 08/30/2023 1126   AST 25 05/17/2017 0957   ALT 19 08/30/2023 1126   ALT 18 05/17/2017 0957   BILITOT 0.5 08/30/2023 1126   BILITOT 0.47 05/17/2017 0957       Vitals:   10/25/23 1043  BP: 138/72  Pulse: 75  Resp: 18  Temp: 98.4 F (36.9 C)  SpO2: 95%   Filed Weights   10/25/23 1043  Weight: 148 lb 9.6 oz (67.4 kg)   Other  relevant data reviewed during this visit included CBC, CMP, urine protein and thyroid  function test

## 2023-10-25 NOTE — Patient Instructions (Signed)

## 2023-10-25 NOTE — Assessment & Plan Note (Addendum)
 She has intermittent elevated TSH I will adjust her thyroid  medicine accordingly I anticipate changes in her thyroid  function after discontinuation of pembrolizumab 

## 2023-10-26 ENCOUNTER — Other Ambulatory Visit: Payer: Self-pay | Admitting: Hematology and Oncology

## 2023-10-26 ENCOUNTER — Other Ambulatory Visit (HOSPITAL_COMMUNITY): Payer: Self-pay

## 2023-10-26 ENCOUNTER — Other Ambulatory Visit: Payer: Self-pay

## 2023-10-26 MED ORDER — ONDANSETRON HCL 8 MG PO TABS
8.0000 mg | ORAL_TABLET | Freq: Three times a day (TID) | ORAL | 3 refills | Status: DC | PRN
  Filled 2023-10-26: qty 30, 10d supply, fill #0

## 2023-10-26 MED ORDER — SIMVASTATIN 20 MG PO TABS
20.0000 mg | ORAL_TABLET | Freq: Every evening | ORAL | 1 refills | Status: DC
  Filled 2023-10-26: qty 90, 90d supply, fill #0
  Filled 2024-02-11: qty 90, 90d supply, fill #1

## 2023-10-31 ENCOUNTER — Telehealth: Payer: Self-pay

## 2023-10-31 NOTE — Telephone Encounter (Signed)
 Returned her call and left the # for medical records as she requested.

## 2023-11-13 ENCOUNTER — Other Ambulatory Visit (HOSPITAL_COMMUNITY): Payer: Self-pay

## 2023-11-13 ENCOUNTER — Other Ambulatory Visit: Payer: Self-pay

## 2023-11-26 ENCOUNTER — Other Ambulatory Visit: Payer: Self-pay | Admitting: Family Medicine

## 2023-11-26 DIAGNOSIS — Z1231 Encounter for screening mammogram for malignant neoplasm of breast: Secondary | ICD-10-CM

## 2023-12-06 ENCOUNTER — Ambulatory Visit
Admission: RE | Admit: 2023-12-06 | Discharge: 2023-12-06 | Disposition: A | Discharge status: Home / Self Care | Source: Ambulatory Visit | Attending: Family Medicine | Admitting: Family Medicine

## 2023-12-06 DIAGNOSIS — Z1231 Encounter for screening mammogram for malignant neoplasm of breast: Secondary | ICD-10-CM

## 2023-12-10 DIAGNOSIS — C55 Malignant neoplasm of uterus, part unspecified: Secondary | ICD-10-CM | POA: Diagnosis not present

## 2023-12-20 ENCOUNTER — Other Ambulatory Visit

## 2023-12-20 ENCOUNTER — Ambulatory Visit: Admitting: Hematology and Oncology

## 2023-12-27 ENCOUNTER — Inpatient Hospital Stay (HOSPITAL_BASED_OUTPATIENT_CLINIC_OR_DEPARTMENT_OTHER): Admitting: Hematology and Oncology

## 2023-12-27 ENCOUNTER — Other Ambulatory Visit: Payer: Self-pay

## 2023-12-27 ENCOUNTER — Other Ambulatory Visit (HOSPITAL_COMMUNITY): Payer: Self-pay

## 2023-12-27 ENCOUNTER — Inpatient Hospital Stay: Attending: Hematology and Oncology

## 2023-12-27 ENCOUNTER — Ambulatory Visit: Payer: Self-pay | Admitting: Hematology and Oncology

## 2023-12-27 ENCOUNTER — Encounter: Payer: Self-pay | Admitting: Hematology and Oncology

## 2023-12-27 VITALS — BP 138/50 | HR 68 | Temp 98.2°F | Resp 18 | Ht 64.0 in | Wt 154.6 lb

## 2023-12-27 DIAGNOSIS — I10 Essential (primary) hypertension: Secondary | ICD-10-CM

## 2023-12-27 DIAGNOSIS — Z923 Personal history of irradiation: Secondary | ICD-10-CM | POA: Insufficient documentation

## 2023-12-27 DIAGNOSIS — Z79899 Other long term (current) drug therapy: Secondary | ICD-10-CM | POA: Diagnosis not present

## 2023-12-27 DIAGNOSIS — E039 Hypothyroidism, unspecified: Secondary | ICD-10-CM

## 2023-12-27 DIAGNOSIS — Z95828 Presence of other vascular implants and grafts: Secondary | ICD-10-CM

## 2023-12-27 DIAGNOSIS — C541 Malignant neoplasm of endometrium: Secondary | ICD-10-CM

## 2023-12-27 DIAGNOSIS — Z9221 Personal history of antineoplastic chemotherapy: Secondary | ICD-10-CM | POA: Insufficient documentation

## 2023-12-27 LAB — COMPREHENSIVE METABOLIC PANEL WITH GFR
ALT: 17 U/L (ref 0–44)
AST: 22 U/L (ref 15–41)
Albumin: 3.9 g/dL (ref 3.5–5.0)
Alkaline Phosphatase: 47 U/L (ref 38–126)
Anion gap: 7 (ref 5–15)
BUN: 22 mg/dL (ref 8–23)
CO2: 28 mmol/L (ref 22–32)
Calcium: 9.5 mg/dL (ref 8.9–10.3)
Chloride: 105 mmol/L (ref 98–111)
Creatinine, Ser: 0.94 mg/dL (ref 0.44–1.00)
GFR, Estimated: >60 mL/min (ref >60)
Glucose, Bld: 90 mg/dL (ref 70–99)
Potassium: 4.1 mmol/L (ref 3.5–5.1)
Sodium: 140 mmol/L (ref 135–145)
Total Bilirubin: 0.4 mg/dL (ref 0.0–1.2)
Total Protein: 6.8 g/dL (ref 6.5–8.1)

## 2023-12-27 LAB — CBC WITH DIFFERENTIAL/PLATELET
Abs Immature Granulocytes: 0.01 K/uL (ref 0.00–0.07)
Basophils Absolute: 0 K/uL (ref 0.0–0.1)
Basophils Relative: 1 %
Eosinophils Absolute: 0.2 K/uL (ref 0.0–0.5)
Eosinophils Relative: 6 %
HCT: 30.3 % — ABNORMAL LOW (ref 36.0–46.0)
Hemoglobin: 9.8 g/dL — ABNORMAL LOW (ref 12.0–15.0)
Immature Granulocytes: 0 %
Lymphocytes Relative: 19 %
Lymphs Abs: 0.7 K/uL (ref 0.7–4.0)
MCH: 32.1 pg (ref 26.0–34.0)
MCHC: 32.3 g/dL (ref 30.0–36.0)
MCV: 99.3 fL (ref 80.0–100.0)
Monocytes Absolute: 0.3 K/uL (ref 0.1–1.0)
Monocytes Relative: 9 %
Neutro Abs: 2.5 K/uL (ref 1.7–7.7)
Neutrophils Relative %: 65 %
Platelets: 184 K/uL (ref 150–400)
RBC: 3.05 MIL/uL — ABNORMAL LOW (ref 3.87–5.11)
RDW: 13.5 % (ref 11.5–15.5)
WBC: 3.8 K/uL — ABNORMAL LOW (ref 4.0–10.5)
nRBC: 0 % (ref 0.0–0.2)

## 2023-12-27 LAB — TSH: TSH: 0.07 u[IU]/mL — ABNORMAL LOW (ref 0.350–4.500)

## 2023-12-27 LAB — TOTAL PROTEIN, URINE DIPSTICK: Protein, ur: NEGATIVE mg/dL

## 2023-12-27 MED ORDER — HEPARIN SOD (PORK) LOCK FLUSH 100 UNIT/ML IV SOLN
500.0000 [IU] | Freq: Once | INTRAVENOUS | Status: AC
  Administered 2023-12-27: 500 [IU]

## 2023-12-27 MED ORDER — SODIUM CHLORIDE 0.9% FLUSH
10.0000 mL | Freq: Once | INTRAVENOUS | Status: AC
Start: 2023-12-27 — End: 2023-12-27
  Administered 2023-12-27: 10 mL

## 2023-12-27 MED ORDER — LEVOTHYROXINE SODIUM 75 MCG PO TABS
75.0000 ug | ORAL_TABLET | Freq: Every day | ORAL | 2 refills | Status: DC
  Filled 2023-12-27: qty 30, 30d supply, fill #0
  Filled 2024-01-23: qty 30, 30d supply, fill #1

## 2023-12-27 NOTE — Telephone Encounter (Signed)
-----   Message from [employer] sent at 12/27/2023  3:00 PM EDT ----- TSH is low I recommend reducing synthroid  to 75 mcg daily Please send 60 tab no refills to her pharmacy of choice ----- Message ----- From: Rebecka, [facility] [place] Sent: 12/27/2023  10:48 AM EDT To: [name], MD

## 2023-12-27 NOTE — Telephone Encounter (Signed)
 Notified of message below. Verbalized understanding. Rx sent to [facility]

## 2023-12-27 NOTE — Assessment & Plan Note (Addendum)
 She will complete amlodipine  taper in the near future She will continue her other antihypertensives

## 2023-12-27 NOTE — Progress Notes (Signed)
 [facility] OFFICE PROGRESS NOTE  Patient Care Team: Diane Planas, MD as PCP - General (Family Medicine) Diane Senior, MD as Consulting Physician (Endocrinology)  Assessment & Plan Endometrial cancer [facility]) She was originally diagnosed with uterine cancer in 2014, early stage I disease but with high-grade serous She completed adjuvant treatment with 6 cycles of carboplatin  and paclitaxel  in 2014 along with radiation treatment  She is noted to have recurrent metastatic disease in her chest in 2016  Pathology: from 10/18, ER/Her2 neg. Foundation One testing done 11-2015 on surgical path from 2014:    MS stable, TMB low  4 muts/mb  She had recurrent disease and was treated with carboplatin , paclitaxel , Megace , tamoxifen , Doxil  and most recently with combination of lenvatinib  and pembrolizumab  since 2020 She achieved complete response in April 2023 and has been on maintenance treatment until May 2025  Overall, she tolerated treatment well without major side effects Last imaging study in April 2025 showed no evidence of disease I plan to see her again in 8 weeks for port flush maintenance and blood work monitoring Plan to repeat imaging study in October Essential hypertension She will complete amlodipine  taper in the near future She will continue her other antihypertensives  No orders of the defined types were placed in this encounter.    Diane Bedford, MD  INTERVAL HISTORY: she returns for surveillance follow-up for history of metastatic endometrial cancer She is currently on surveillance She is asymptomatic She has obtained second opinion at [facility]  PHYSICAL EXAMINATION: ECOG PERFORMANCE STATUS: 0 - Asymptomatic  Vitals:   12/27/23 1058  BP: (!) 138/50  Pulse: 68  Resp: 18  Temp: 98.2 F (36.8 C)  SpO2: 99%   Filed Weights   12/27/23 1058  Weight: 154 lb 9.6 oz (70.1 kg)    Relevant data reviewed during this visit included CBC and CMP

## 2023-12-27 NOTE — Assessment & Plan Note (Addendum)
 She was originally diagnosed with uterine cancer in 2014, early stage I disease but with high-grade serous She completed adjuvant treatment with 6 cycles of carboplatin  and paclitaxel  in 2014 along with radiation treatment  She is noted to have recurrent metastatic disease in her chest in 2016  Pathology: from 10/18, ER/Her2 neg. Foundation One testing done 11-2015 on surgical path from 2014:    MS stable, TMB low  4 muts/mb  She had recurrent disease and was treated with carboplatin , paclitaxel , Megace , tamoxifen , Doxil  and most recently with combination of lenvatinib  and pembrolizumab  since 2020 She achieved complete response in April 2023 and has been on maintenance treatment until May 2025  Overall, she tolerated treatment well without major side effects Last imaging study in April 2025 showed no evidence of disease I plan to see her again in 8 weeks for port flush maintenance and blood work monitoring Plan to repeat imaging study in October

## 2023-12-28 ENCOUNTER — Other Ambulatory Visit: Payer: Self-pay

## 2023-12-28 ENCOUNTER — Encounter: Payer: Self-pay | Admitting: Pharmacist

## 2023-12-28 ENCOUNTER — Other Ambulatory Visit (HOSPITAL_COMMUNITY): Payer: Self-pay

## 2023-12-31 ENCOUNTER — Other Ambulatory Visit: Payer: Self-pay

## 2023-12-31 ENCOUNTER — Encounter: Payer: Self-pay | Admitting: Hematology and Oncology

## 2023-12-31 ENCOUNTER — Telehealth: Payer: Self-pay

## 2023-12-31 NOTE — Telephone Encounter (Signed)
 Spoke to patient regarding recent myChart message noting new swelling around eyes and in BLE. Patient noted that swelling started Saturday morning and one eye and has since progressed to both. Swelling tends to improve as day goes on and does not impact her ADLs. Patient noted increased swelling in BLE yesterday that did not resolve as they normally do with elevation or rest.  No swelling noted in throat. No c/o cough, chest pain, or difficulty breathing at this time.  Patient reports no new medications or environmental changes. Patient had amlodipine  and synthroid  doses adjusted recently. Patient is not on any current chemo or immunotherapy treatments.  Dr. Lonn aware and recommends that patient be evaluated by PCP or come into clinic to see her on Friday. Patient states that she will call her PCP to see if they can evaluate her. Patient knows to call office back should she have any questions or concerns.

## 2024-01-01 ENCOUNTER — Other Ambulatory Visit (HOSPITAL_COMMUNITY): Payer: Self-pay

## 2024-01-01 ENCOUNTER — Other Ambulatory Visit: Payer: Self-pay

## 2024-01-01 DIAGNOSIS — E039 Hypothyroidism, unspecified: Secondary | ICD-10-CM | POA: Diagnosis not present

## 2024-01-01 DIAGNOSIS — I10 Essential (primary) hypertension: Secondary | ICD-10-CM | POA: Diagnosis not present

## 2024-01-01 DIAGNOSIS — C541 Malignant neoplasm of endometrium: Secondary | ICD-10-CM | POA: Diagnosis not present

## 2024-01-01 MED ORDER — HYDROCHLOROTHIAZIDE 12.5 MG PO CAPS
12.5000 mg | ORAL_CAPSULE | Freq: Every day | ORAL | 3 refills | Status: AC
  Filled 2024-01-01: qty 90, 90d supply, fill #0
  Filled 2024-03-19: qty 90, 90d supply, fill #1
  Filled 2024-06-20: qty 90, 90d supply, fill #2

## 2024-01-11 ENCOUNTER — Other Ambulatory Visit: Payer: Self-pay

## 2024-01-11 ENCOUNTER — Other Ambulatory Visit (HOSPITAL_COMMUNITY): Payer: Self-pay

## 2024-01-23 ENCOUNTER — Other Ambulatory Visit: Payer: Self-pay

## 2024-01-23 ENCOUNTER — Other Ambulatory Visit (HOSPITAL_COMMUNITY): Payer: Self-pay

## 2024-01-23 ENCOUNTER — Other Ambulatory Visit: Payer: Self-pay | Admitting: Hematology and Oncology

## 2024-01-23 MED ORDER — METOPROLOL TARTRATE 25 MG PO TABS
25.0000 mg | ORAL_TABLET | Freq: Two times a day (BID) | ORAL | 3 refills | Status: AC
  Filled 2024-01-23: qty 180, 90d supply, fill #0
  Filled 2024-04-21: qty 180, 90d supply, fill #1

## 2024-01-25 ENCOUNTER — Other Ambulatory Visit (HOSPITAL_COMMUNITY): Payer: Self-pay

## 2024-01-25 MED ORDER — AMOXICILLIN 500 MG PO CAPS
500.0000 mg | ORAL_CAPSULE | Freq: Three times a day (TID) | ORAL | 0 refills | Status: DC
  Filled 2024-01-25: qty 21, 7d supply, fill #0

## 2024-02-11 ENCOUNTER — Other Ambulatory Visit: Payer: Self-pay

## 2024-02-21 ENCOUNTER — Encounter: Payer: Self-pay | Admitting: Hematology and Oncology

## 2024-02-21 ENCOUNTER — Inpatient Hospital Stay (HOSPITAL_BASED_OUTPATIENT_CLINIC_OR_DEPARTMENT_OTHER): Admitting: Hematology and Oncology

## 2024-02-21 ENCOUNTER — Inpatient Hospital Stay: Attending: Hematology and Oncology

## 2024-02-21 VITALS — BP 107/68 | HR 70 | Temp 98.0°F | Resp 18 | Ht 64.0 in | Wt 156.8 lb

## 2024-02-21 DIAGNOSIS — Z9221 Personal history of antineoplastic chemotherapy: Secondary | ICD-10-CM | POA: Insufficient documentation

## 2024-02-21 DIAGNOSIS — E538 Deficiency of other specified B group vitamins: Secondary | ICD-10-CM | POA: Diagnosis not present

## 2024-02-21 DIAGNOSIS — Z85850 Personal history of malignant neoplasm of thyroid: Secondary | ICD-10-CM | POA: Insufficient documentation

## 2024-02-21 DIAGNOSIS — C541 Malignant neoplasm of endometrium: Secondary | ICD-10-CM | POA: Diagnosis not present

## 2024-02-21 DIAGNOSIS — D61818 Other pancytopenia: Secondary | ICD-10-CM | POA: Diagnosis not present

## 2024-02-21 DIAGNOSIS — Z923 Personal history of irradiation: Secondary | ICD-10-CM | POA: Insufficient documentation

## 2024-02-21 DIAGNOSIS — Z8542 Personal history of malignant neoplasm of other parts of uterus: Secondary | ICD-10-CM | POA: Insufficient documentation

## 2024-02-21 DIAGNOSIS — Z79899 Other long term (current) drug therapy: Secondary | ICD-10-CM | POA: Insufficient documentation

## 2024-02-21 DIAGNOSIS — R946 Abnormal results of thyroid function studies: Secondary | ICD-10-CM

## 2024-02-21 DIAGNOSIS — I10 Essential (primary) hypertension: Secondary | ICD-10-CM

## 2024-02-21 DIAGNOSIS — E039 Hypothyroidism, unspecified: Secondary | ICD-10-CM

## 2024-02-21 LAB — COMPREHENSIVE METABOLIC PANEL WITH GFR
ALT: 18 U/L (ref 0–44)
AST: 24 U/L (ref 15–41)
Albumin: 4.2 g/dL (ref 3.5–5.0)
Alkaline Phosphatase: 59 U/L (ref 38–126)
Anion gap: 6 (ref 5–15)
BUN: 25 mg/dL — ABNORMAL HIGH (ref 8–23)
CO2: 30 mmol/L (ref 22–32)
Calcium: 9.8 mg/dL (ref 8.9–10.3)
Chloride: 102 mmol/L (ref 98–111)
Creatinine, Ser: 0.87 mg/dL (ref 0.44–1.00)
GFR, Estimated: >60 mL/min (ref >60)
Glucose, Bld: 90 mg/dL (ref 70–99)
Potassium: 4 mmol/L (ref 3.5–5.1)
Sodium: 138 mmol/L (ref 135–145)
Total Bilirubin: 0.4 mg/dL (ref 0.0–1.2)
Total Protein: 7.2 g/dL (ref 6.5–8.1)

## 2024-02-21 LAB — CBC WITH DIFFERENTIAL/PLATELET
Abs Immature Granulocytes: 0 K/uL (ref 0.00–0.07)
Basophils Absolute: 0.1 K/uL (ref 0.0–0.1)
Basophils Relative: 2 %
Eosinophils Absolute: 0.4 K/uL (ref 0.0–0.5)
Eosinophils Relative: 10 %
HCT: 31 % — ABNORMAL LOW (ref 36.0–46.0)
Hemoglobin: 10.2 g/dL — ABNORMAL LOW (ref 12.0–15.0)
Immature Granulocytes: 0 %
Lymphocytes Relative: 17 %
Lymphs Abs: 0.6 K/uL — ABNORMAL LOW (ref 0.7–4.0)
MCH: 31 pg (ref 26.0–34.0)
MCHC: 32.9 g/dL (ref 30.0–36.0)
MCV: 94.2 fL (ref 80.0–100.0)
Monocytes Absolute: 0.3 K/uL (ref 0.1–1.0)
Monocytes Relative: 9 %
Neutro Abs: 2.4 K/uL (ref 1.7–7.7)
Neutrophils Relative %: 62 %
Platelets: 191 K/uL (ref 150–400)
RBC: 3.29 MIL/uL — ABNORMAL LOW (ref 3.87–5.11)
RDW: 12.7 % (ref 11.5–15.5)
WBC: 3.8 K/uL — ABNORMAL LOW (ref 4.0–10.5)
nRBC: 0 % (ref 0.0–0.2)

## 2024-02-21 LAB — TSH: TSH: 1.78 u[IU]/mL (ref 0.350–4.500)

## 2024-02-21 NOTE — Assessment & Plan Note (Addendum)
 She was originally diagnosed with uterine cancer in 2014, early stage I disease but with high-grade serous She completed adjuvant treatment with 6 cycles of carboplatin  and paclitaxel  in 2014 along with radiation treatment  She is noted to have recurrent metastatic disease in her chest in 2016  Pathology: from 10/18, ER/Her2 neg. [facility] One testing done 11-2015 on surgical path from 2014:    MS stable, TMB low  4 muts/mb  She had recurrent disease and was treated with carboplatin , paclitaxel , Megace , tamoxifen , Doxil  and most recently with combination of lenvatinib  and pembrolizumab  since 2020 She achieved complete response in April 2023 and has been on maintenance treatment until May 2025  Last imaging study in April 2025 showed no evidence of disease I plan to see her again in 8 weeks for port flush maintenance and blood work monitoring Plan to repeat imaging study in October

## 2024-02-21 NOTE — Progress Notes (Signed)
 [facility] OFFICE PROGRESS NOTE  Patient Care Team: Cleotilde Planas, MD as PCP - General (Family Medicine) Nichole Senior, MD as Consulting Physician (Endocrinology)  Assessment & Plan Vitamin B12 deficiency She will continue monthly vitamin B12 injection Last B12 level in January was adequate Endometrial cancer [facility]) She was originally diagnosed with uterine cancer in 2014, early stage I disease but with high-grade serous She completed adjuvant treatment with 6 cycles of carboplatin  and paclitaxel  in 2014 along with radiation treatment  She is noted to have recurrent metastatic disease in her chest in 2016  Pathology: from 10/18, ER/Her2 neg. [facility] One testing done 11-2015 on surgical path from 2014:    MS stable, TMB low  4 muts/mb  She had recurrent disease and was treated with carboplatin , paclitaxel , Megace , tamoxifen , Doxil  and most recently with combination of lenvatinib  and pembrolizumab  since 2020 She achieved complete response in April 2023 and has been on maintenance treatment until May 2025  Last imaging study in April 2025 showed no evidence of disease I plan to see her again in 8 weeks for port flush maintenance and blood work monitoring Plan to repeat imaging study in October Pancytopenia, acquired [facility]) Her pancytopenia is stable She will continue vitamin B12 injection  Orders Placed This Encounter  Procedures   NM PET Image Restag (PS) Skull Base To Thigh    Standing Status:   Future    Expected Date:   04/17/2024    Expiration Date:   02/20/2025    If indicated for the ordered procedure, I authorize the administration of a radiopharmaceutical per Radiology protocol:   Yes    Preferred imaging location?:   [facility]   Vitamin B12    Standing Status:   Standing    Number of Occurrences:   2    Expiration Date:   02/20/2025     [name], MD  INTERVAL HISTORY: she returns for surveillance follow-up for history of recurrent uterine cancer  and thyroid  cancer She is currently on surveillance She is doing well Denies side effects from treatment We discussed timing of next imaging  PHYSICAL EXAMINATION: ECOG PERFORMANCE STATUS: 0 - Asymptomatic  Vitals:   02/21/24 1023  BP: 107/68  Pulse: 70  Resp: 18  Temp: 98 F (36.7 C)   Filed Weights   02/21/24 1023  Weight: 156 lb 12.8 oz (71.1 kg)    Relevant data reviewed during this visit included CBC, CMP, TSH

## 2024-02-21 NOTE — Assessment & Plan Note (Addendum)
She will continue monthly vitamin B12 injection Last B12 level in January was adequate

## 2024-02-21 NOTE — Assessment & Plan Note (Addendum)
Her pancytopenia is stable She will continue vitamin B12 injection

## 2024-02-22 ENCOUNTER — Other Ambulatory Visit: Payer: Self-pay | Admitting: Hematology and Oncology

## 2024-02-22 ENCOUNTER — Other Ambulatory Visit (HOSPITAL_COMMUNITY): Payer: Self-pay

## 2024-02-22 ENCOUNTER — Telehealth: Payer: Self-pay

## 2024-02-22 MED ORDER — LEVOTHYROXINE SODIUM 75 MCG PO TABS
75.0000 ug | ORAL_TABLET | Freq: Every day | ORAL | 2 refills | Status: DC
  Filled 2024-02-22: qty 90, 90d supply, fill #0

## 2024-02-22 NOTE — Telephone Encounter (Signed)
 LVM regarding patient's recent lab results. Patient informed TSH levels looked good and Dr. Lonn has sent in a refill for her synthroid  to her pharmacy. Provided call back number should patient have any questions or concerns.

## 2024-03-02 ENCOUNTER — Encounter: Payer: Self-pay | Admitting: Emergency Medicine

## 2024-03-02 ENCOUNTER — Ambulatory Visit
Admission: EM | Admit: 2024-03-02 | Discharge: 2024-03-02 | Disposition: A | Discharge status: Home / Self Care | Attending: Nurse Practitioner | Admitting: Nurse Practitioner

## 2024-03-02 DIAGNOSIS — L243 Irritant contact dermatitis due to cosmetics: Secondary | ICD-10-CM | POA: Diagnosis not present

## 2024-03-02 DIAGNOSIS — T7849XA Other allergy, initial encounter: Secondary | ICD-10-CM

## 2024-03-02 MED ORDER — DEXAMETHASONE SODIUM PHOSPHATE 10 MG/ML IJ SOLN
10.0000 mg | Freq: Once | INTRAMUSCULAR | Status: AC
Start: 2024-03-02 — End: 2024-03-02
  Administered 2024-03-02: 10 mg via INTRAMUSCULAR

## 2024-03-02 MED ORDER — METHYLPREDNISOLONE 4 MG PO TBPK: ORAL_TABLET | ORAL | 0 refills | Status: DC

## 2024-03-02 NOTE — ED Triage Notes (Signed)
 Pt states she used new under eye cream on Friday and she developed bilateral under eye redness and swelling the next day. States its not really painful but is itchy. Redness and swelling worse under left eye.  She took a benadryl  last night but it didn't help.

## 2024-03-02 NOTE — Discharge Instructions (Addendum)
 You developed swelling and redness around your eyes after using a new eye cream. This is most likely an allergic reaction to the product. You received a steroid injection today to help with swelling. Start the prescribed oral steroids tomorrow morning. Take Benadryl  25 mg every six hours for the next day, then only as needed. Apply cool compresses to your eyes to help with itching and discomfort. Stop using the new cream and avoid any products with fragrances until your skin has fully healed. Follow up with your primary care provider if your symptoms are not improving in a few days. Go to the emergency department immediately if you develop vision changes, worsening swelling, trouble breathing or swallowing, or signs of infection like pus or spreading redness.

## 2024-03-02 NOTE — ED Provider Notes (Signed)
 [facility]    CSN: [id number] Arrival date & time: 03/02/24  1341      History   Chief Complaint Chief Complaint  Patient presents with   Eye Problem   Allergic Reaction    HPI Diane Lozano is a 68 y.o. female.   Discussed the use of AI scribe software for clinical note transcription with the patient, who gave verbal consent to proceed.   Patient presents with swelling and redness around both eyes, worse in the left eye, following the application of a new eye cream on Friday night. The patient woke up on Saturday with swelling, which worsened by Sunday morning. The affected areas are itchy, but there is no blurred vision or discharge. This is the first time the patient has used this particular eye cream, which was recommended by an online dermatologist.  The patient denies any itching of the face or throat, swallowing issues, cough, cold, or congestion. They took Benadryl  the night before the visit, which may have helped with the itching. The patient reports no history of diabetes. They are concerned about why one eye reacted worse than the other and why the symptoms worsened on the second day after application.  The following sections of the patient's history were reviewed and updated as appropriate: allergies, current medications, past family history, past medical history, past social history, past surgical history, and problem list.       Past Medical History:  Diagnosis Date   Anemia    Anxiety    hx of   Depression    hx of   Endometrial ca (HCC) 2014   iA high grade papillary serous endometrial carcinoma   History of radiation therapy 04/13/16-06/01/16   chest 56 Gy in 28 fractions   History of radiation therapy 05/19/2020-06/30/2020   supraclavicular; Dr. Lynwood Nasuti   Hx of radiation therapy 5/27, 6/5, 6/11, 6/19, 11/27/2012   proximal vagina 30 Gy in 5 fx, HDR   Hyperlipidemia    Hypertension    no current bp meds due to weight loss    Lung  mass    PONV (postoperative nausea and vomiting)    Radiation 04/05/15 - 05/13/15   retroperitoneal nodes 50.4 gray boosted to 56 gray   Thyroid  nodule    sees dr nichole q 6 months for    Patient Active Problem List   Diagnosis Date Noted   Poor dentition 12/21/2022   Yeast infection of the skin 11/09/2022   Bilateral leg edema 01/12/2022   Deficiency anemia 11/08/2020   Vitamin B12 deficiency 11/08/2020   Grief 08/19/2020   Preventive measure 02/05/2020   Diarrhea 01/15/2020   Anemia due to antineoplastic chemotherapy 12/04/2019   Esophagitis, reflux 07/31/2019   Acute laryngitis 08/27/2018   Hand foot syndrome 08/16/2018   Mucositis due to chemotherapy 08/16/2018   Acquired hypothyroidism 08/16/2018   Cataracts, bilateral 03/07/2018   Dermatitis 11/23/2017   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) 07/11/2017   Essential hypertension 06/14/2017   Pancytopenia, acquired (HCC) 05/18/2017   Port-A-Cath in place 04/18/2017   Goals of care, counseling/discussion 04/06/2017   Leukopenia 07/01/2016   Varicose veins of both lower extremities 07/01/2016   Thrombophlebitis of superficial veins of left lower extremity 05/20/2016   History of thyroid  nodule 12/21/2015   Hyperlipidemia 12/21/2015   High risk medication use 10/31/2015   Mediastinal lymphadenopathy 10/05/2015   Anxiety about health 10/05/2015   Right thyroid  nodule 10/05/2015   Metastatic cancer to intrathoracic lymph nodes (HCC)  09/23/2015   Thyroid  goiter 03/13/2015   Chemotherapy induced neutropenia 01/29/2015   Hypoglycemia 01/16/2015   Chemotherapy-induced thrombocytopenia 12/26/2014   Leukopenia due to antineoplastic chemotherapy (HCC) 12/05/2014   Chemotherapy induced nausea and vomiting 12/05/2014   Metastatic cancer to intra-abdominal lymph nodes (HCC) 11/20/2014   Abnormal thyroid  scan 11/20/2014   Hx of radiation therapy    Endometrial cancer (HCC) 08/26/2012   HTN (hypertension) 08/26/2012     Past Surgical History:  Procedure Laterality Date   ABDOMINAL HYSTERECTOMY     robotic hysterectomy with BSO and bilateral pelvic and periaortic node dissection   CESAREAN SECTION     CESAREAN SECTION     COLONOSCOPY  2008   ENDOBRONCHIAL ULTRASOUND Bilateral 04/02/2017   Procedure: ENDOBRONCHIAL ULTRASOUND;  Surgeon: Mannam, Praveen, MD;  Location: [facility];  Service: Cardiopulmonary;  Laterality: Bilateral;   IR FLUORO GUIDE PORT INSERTION RIGHT  04/11/2017   IR US  GUIDE VASC ACCESS RIGHT  04/11/2017    OB History   No obstetric history on file.      Home Medications    Prior to Admission medications   Medication Sig Start Date End Date Taking? Authorizing Provider  methylPREDNISolone (MEDROL DOSEPAK) 4 MG TBPK tablet Take as directed; start on the morning of 03/03/24 03/02/24  Yes Manami Tutor, Lucie, FNP  amLODipine  (NORVASC ) 10 MG tablet Take 5 mg by mouth daily.    [provider]  amoxicillin  (AMOXIL ) 500 MG capsule Take 1 capsule (500 mg total) by mouth 3 (three) times daily for 7 days until gone 01/25/24     busPIRone  (BUSPAR ) 10 MG tablet Take 1 tablet (10 mg total) by mouth 2 (two) times daily. 10/05/23     Cholecalciferol (VITAMIN D3) 2000 units TABS Take 2,000 Units by mouth daily.     [provider]  cyanocobalamin  (DODEX ) 1000 MCG/ML injection Inject 1 mL into the muscle every 30 days. 02/07/23   Lonn Hicks, MD  hydrochlorothiazide  (MICROZIDE ) 12.5 MG capsule Take 1 capsule (12.5 mg total) by mouth daily. 01/01/24     levothyroxine  (SYNTHROID ) 75 MCG tablet Take 1 tablet (75 mcg total) by mouth daily before breakfast. 02/22/24   Lonn Hicks, MD  losartan  (COZAAR ) 25 MG tablet Take 1 tablet (25 mg total) by mouth daily. 09/26/23   Lonn Hicks, MD  metoprolol  tartrate (LOPRESSOR ) 25 MG tablet Take 1 tablet (25 mg total) by mouth 2 (two) times daily. 01/23/24   Lonn Hicks, MD  pantoprazole  (PROTONIX ) 40 MG tablet Take 1 tablet (40 mg total) by mouth  daily. 12/21/22   Lonn Hicks, MD  simvastatin  (ZOCOR ) 20 MG tablet Take 1 tablet (20 mg total) by mouth every evening. 10/26/23     SYRINGE-NEEDLE, DISP, 3 ML 25G X 5/8 3 ML MISC Use as directed to administer B12 shot every 30 days. 12/01/21   Lonn Hicks, MD  tacrolimus  (PROTOPIC ) 0.1 % ointment Apply to affected area on the skin twice a day 11/21/22     venlafaxine  XR (EFFEXOR  XR) 150 MG 24 hr capsule Take 2 capsules (300 mg total) by mouth daily every morning with food. 10/05/23       Family History Family History  Problem Relation Age of Onset   Dementia Mother    Thyroid  disease Mother    Heart disease Father    Breast cancer Daughter    Polycystic ovary syndrome Daughter    Cancer Brother        melanoma involving eye    Social History Social  History   Tobacco Use   Smoking status: Former    Current packs/day: 0.00    Average packs/day: 1 pack/day for 10.0 years (10.0 ttl pk-yrs)    Types: Cigarettes    Start date: 06/06/1971    Quit date: 06/05/1981    Years since quitting: 42.7   Smokeless tobacco: Never  Vaping Use   Vaping status: Never Used  Substance Use Topics   Alcohol use: Yes    Comment: occasional   Drug use: No     Allergies   Lenvima  [lenvatinib ]   Review of Systems Review of Systems  HENT:  Negative for sore throat and trouble swallowing.   Eyes:  Positive for itching. Negative for photophobia, pain, discharge, redness and visual disturbance.  Skin:  Negative for rash.  All other systems reviewed and are negative.    Physical Exam Triage Vital Signs ED Triage Vitals  Encounter Vitals Group     BP 03/02/24 1354 110/73     Girls Systolic BP Percentile --      Girls Diastolic BP Percentile --      Boys Systolic BP Percentile --      Boys Diastolic BP Percentile --      Pulse Rate 03/02/24 1354 68     Resp 03/02/24 1354 20     Temp 03/02/24 1354 97.6 F (36.4 C)     Temp Source 03/02/24 1354 Oral     SpO2 03/02/24 1354 96 %     Weight --       Height --      Head Circumference --      Peak Flow --      Pain Score 03/02/24 1355 0     Pain Loc --      Pain Education --      Exclude from Growth Chart --    No data found.  Updated Vital Signs BP 110/73 (BP Location: Right Arm)   Pulse 68   Temp 97.6 F (36.4 C) (Oral)   Resp 20   SpO2 96%   Visual Acuity Right Eye Distance:   Left Eye Distance:   Bilateral Distance:    Right Eye Near:   Left Eye Near:    Bilateral Near:     Physical Exam Vitals reviewed.  Constitutional:      General: She is awake. She is not in acute distress.    Appearance: Normal appearance. She is well-developed. She is not ill-appearing, toxic-appearing or diaphoretic.  HENT:     Head: Normocephalic.     Right Ear: Hearing normal.     Left Ear: Hearing normal.     Nose: Nose normal.     Mouth/Throat:     Mouth: Mucous membranes are moist.  Eyes:     General: Lids are normal. Vision grossly intact.     Extraocular Movements: Extraocular movements intact.     Conjunctiva/sclera: Conjunctivae normal.     Pupils: Pupils are equal, round, and reactive to light.     Comments: Bilateral periorbital swelling and erythema, more pronounced on the left. No tenderness to the eyes or surrounding structures. Extraocular movements intact without pain or restriction. Pupils equal, round, and reactive to light. Conjunctiva clear without injection or drainage. Vision grossly intact. No proptosis or evidence of orbital compartment syndrome.  Cardiovascular:     Rate and Rhythm: Normal rate and regular rhythm.     Heart sounds: Normal heart sounds.  Pulmonary:     Effort: Pulmonary effort is  normal.     Breath sounds: Normal breath sounds and air entry.  Musculoskeletal:        General: Normal range of motion.     Cervical back: Normal range of motion and neck supple.  Skin:    General: Skin is warm and dry.  Neurological:     General: No focal deficit present.     Mental Status: She is alert and  oriented to person, place, and time.  Psychiatric:        Speech: Speech normal.        Behavior: Behavior is cooperative.      UC Treatments / Results  Labs (all labs ordered are listed, but only abnormal results are displayed) Labs Reviewed - No data to display  EKG   Radiology No results found.  Procedures Procedures (including critical care time)  Medications Ordered in UC Medications  dexamethasone  (DECADRON ) injection 10 mg (10 mg Intramuscular Given 03/02/24 1433)    Initial Impression / Assessment and Plan / UC Course  I have reviewed the triage vital signs and the nursing notes.  Pertinent labs & imaging results that were available during my care of the patient were reviewed by me and considered in my medical decision making (see chart for details).     The patient presents with bilateral periorbital swelling and erythema, more pronounced on the left, that developed after applying a new eye cream Friday night. Symptoms worsened the following day and are associated with pruritus but no blurred vision, discharge, airway involvement, or systemic symptoms. Findings are most consistent with localized allergic contact dermatitis to the cosmetic product. In clinic, the patient was given a Decadron  injection and prescribed a Medrol dose pack to begin tomorrow. She was instructed to take Benadryl  25 mg every six hours for the next 24 hours, then as needed, and to apply cool compresses to the affected areas. She was advised to discontinue use of the eye cream and to avoid products with fragrances until the reaction resolves. Patient was counseled to follow up with her primary care provider if symptoms are not improving and to seek immediate evaluation in the emergency department if she develops vision changes, increased swelling, difficulty breathing or swallowing, or signs of infectio  Today's evaluation has revealed no signs of a dangerous process. Discussed diagnosis with patient  and/or guardian. Patient and/or guardian aware of their diagnosis, possible red flag symptoms to watch out for and need for close follow up. Patient and/or guardian understands verbal and written discharge instructions. Patient and/or guardian comfortable with plan and disposition.  Patient and/or guardian has a clear mental status at this time, good insight into illness (after discussion and teaching) and has clear judgment to make decisions regarding their care  Documentation was completed with the aid of voice recognition software. Transcription may contain typographical errors.  Final Clinical Impressions(s) / UC Diagnoses   Final diagnoses:  Allergic reaction to cosmetics  Irritant contact dermatitis due to cosmetics     Discharge Instructions      You developed swelling and redness around your eyes after using a new eye cream. This is most likely an allergic reaction to the product. You received a steroid injection today to help with swelling. Start the prescribed oral steroids tomorrow morning. Take Benadryl  25 mg every six hours for the next day, then only as needed. Apply cool compresses to your eyes to help with itching and discomfort. Stop using the new cream and avoid any products with fragrances  until your skin has fully healed. Follow up with your primary care provider if your symptoms are not improving in a few days. Go to the emergency department immediately if you develop vision changes, worsening swelling, trouble breathing or swallowing, or signs of infection like pus or spreading redness.     ED Prescriptions     Medication Sig Dispense Auth. Provider   methylPREDNISolone (MEDROL DOSEPAK) 4 MG TBPK tablet Take as directed; start on the morning of 03/03/24 21 tablet Iola Lukes, FNP      PDMP not reviewed this encounter.   Iola Lukes, [place] 03/02/24 [phone]

## 2024-03-03 ENCOUNTER — Encounter: Payer: Self-pay | Admitting: Hematology and Oncology

## 2024-03-03 ENCOUNTER — Other Ambulatory Visit: Payer: Self-pay | Admitting: Hematology and Oncology

## 2024-03-03 ENCOUNTER — Other Ambulatory Visit: Payer: Self-pay

## 2024-03-03 MED ORDER — CYANOCOBALAMIN 1000 MCG/ML IJ SOLN
1000.0000 ug | INTRAMUSCULAR | 11 refills | Status: AC
  Filled 2024-03-03: qty 1, 30d supply, fill #0
  Filled 2024-04-08: qty 1, 30d supply, fill #1
  Filled 2024-05-07: qty 1, 30d supply, fill #2
  Filled 2024-06-20: qty 3, 90d supply, fill #3

## 2024-03-07 ENCOUNTER — Other Ambulatory Visit (HOSPITAL_COMMUNITY): Payer: Self-pay

## 2024-03-12 ENCOUNTER — Other Ambulatory Visit: Payer: Self-pay

## 2024-03-12 ENCOUNTER — Other Ambulatory Visit (HOSPITAL_COMMUNITY): Payer: Self-pay

## 2024-03-17 ENCOUNTER — Other Ambulatory Visit: Payer: Self-pay | Admitting: Hematology and Oncology

## 2024-03-17 ENCOUNTER — Other Ambulatory Visit (HOSPITAL_COMMUNITY): Payer: Self-pay

## 2024-03-17 ENCOUNTER — Other Ambulatory Visit: Payer: Self-pay

## 2024-03-17 ENCOUNTER — Encounter: Payer: Self-pay | Admitting: Hematology and Oncology

## 2024-03-17 MED ORDER — BD LUER-LOK SYRINGE 25G X 5/8" 3 ML MISC
0 refills | Status: AC
  Filled 2024-03-17: qty 12, 365d supply, fill #0

## 2024-03-18 DIAGNOSIS — M8580 Other specified disorders of bone density and structure, unspecified site: Secondary | ICD-10-CM | POA: Diagnosis not present

## 2024-03-18 DIAGNOSIS — D61810 Antineoplastic chemotherapy induced pancytopenia: Secondary | ICD-10-CM | POA: Diagnosis not present

## 2024-03-18 DIAGNOSIS — K219 Gastro-esophageal reflux disease without esophagitis: Secondary | ICD-10-CM | POA: Diagnosis not present

## 2024-03-18 DIAGNOSIS — I10 Essential (primary) hypertension: Secondary | ICD-10-CM | POA: Diagnosis not present

## 2024-03-18 DIAGNOSIS — I358 Other nonrheumatic aortic valve disorders: Secondary | ICD-10-CM | POA: Diagnosis not present

## 2024-03-18 DIAGNOSIS — E538 Deficiency of other specified B group vitamins: Secondary | ICD-10-CM | POA: Diagnosis not present

## 2024-03-18 DIAGNOSIS — I5189 Other ill-defined heart diseases: Secondary | ICD-10-CM | POA: Diagnosis not present

## 2024-03-18 DIAGNOSIS — E785 Hyperlipidemia, unspecified: Secondary | ICD-10-CM | POA: Diagnosis not present

## 2024-03-18 DIAGNOSIS — E041 Nontoxic single thyroid nodule: Secondary | ICD-10-CM | POA: Diagnosis not present

## 2024-03-18 DIAGNOSIS — C541 Malignant neoplasm of endometrium: Secondary | ICD-10-CM | POA: Diagnosis not present

## 2024-03-19 ENCOUNTER — Other Ambulatory Visit: Payer: Self-pay

## 2024-03-26 ENCOUNTER — Other Ambulatory Visit (HOSPITAL_COMMUNITY): Payer: Self-pay

## 2024-03-26 MED ORDER — LEVOTHYROXINE SODIUM 88 MCG PO TABS
88.0000 ug | ORAL_TABLET | Freq: Every morning | ORAL | 3 refills | Status: AC
  Filled 2024-03-26: qty 90, 90d supply, fill #0
  Filled 2024-06-20: qty 90, 90d supply, fill #1

## 2024-04-08 ENCOUNTER — Other Ambulatory Visit (HOSPITAL_COMMUNITY): Payer: Self-pay

## 2024-04-17 ENCOUNTER — Ambulatory Visit (HOSPITAL_COMMUNITY)
Admission: RE | Admit: 2024-04-17 | Discharge: 2024-04-17 | Disposition: A | Discharge status: Home / Self Care | Source: Ambulatory Visit | Attending: Hematology and Oncology | Admitting: Hematology and Oncology

## 2024-04-17 ENCOUNTER — Inpatient Hospital Stay: Attending: Hematology and Oncology

## 2024-04-17 DIAGNOSIS — C541 Malignant neoplasm of endometrium: Secondary | ICD-10-CM | POA: Diagnosis present

## 2024-04-17 DIAGNOSIS — Z9221 Personal history of antineoplastic chemotherapy: Secondary | ICD-10-CM | POA: Insufficient documentation

## 2024-04-17 DIAGNOSIS — E538 Deficiency of other specified B group vitamins: Secondary | ICD-10-CM | POA: Diagnosis not present

## 2024-04-17 DIAGNOSIS — R946 Abnormal results of thyroid function studies: Secondary | ICD-10-CM | POA: Insufficient documentation

## 2024-04-17 DIAGNOSIS — F419 Anxiety disorder, unspecified: Secondary | ICD-10-CM | POA: Insufficient documentation

## 2024-04-17 DIAGNOSIS — E039 Hypothyroidism, unspecified: Secondary | ICD-10-CM

## 2024-04-17 DIAGNOSIS — I10 Essential (primary) hypertension: Secondary | ICD-10-CM

## 2024-04-17 LAB — TSH: TSH: 1.65 u[IU]/mL (ref 0.350–4.500)

## 2024-04-17 LAB — COMPREHENSIVE METABOLIC PANEL WITH GFR
ALT: 13 U/L (ref 0–44)
AST: 19 U/L (ref 15–41)
Albumin: 4.2 g/dL (ref 3.5–5.0)
Alkaline Phosphatase: 59 U/L (ref 38–126)
Anion gap: 6 (ref 5–15)
BUN: 24 mg/dL — ABNORMAL HIGH (ref 8–23)
CO2: 28 mmol/L (ref 22–32)
Calcium: 9.7 mg/dL (ref 8.9–10.3)
Chloride: 103 mmol/L (ref 98–111)
Creatinine, Ser: 0.85 mg/dL (ref 0.44–1.00)
GFR, Estimated: >60 mL/min (ref >60)
Glucose, Bld: 90 mg/dL (ref 70–99)
Potassium: 3.9 mmol/L (ref 3.5–5.1)
Sodium: 137 mmol/L (ref 135–145)
Total Bilirubin: 0.5 mg/dL (ref 0.0–1.2)
Total Protein: 7 g/dL (ref 6.5–8.1)

## 2024-04-17 LAB — CBC WITH DIFFERENTIAL/PLATELET
Abs Immature Granulocytes: 0.02 K/uL (ref 0.00–0.07)
Basophils Absolute: 0.1 K/uL (ref 0.0–0.1)
Basophils Relative: 2 %
Eosinophils Absolute: 0.4 K/uL (ref 0.0–0.5)
Eosinophils Relative: 9 %
HCT: 31.8 % — ABNORMAL LOW (ref 36.0–46.0)
Hemoglobin: 10.5 g/dL — ABNORMAL LOW (ref 12.0–15.0)
Immature Granulocytes: 0 %
Lymphocytes Relative: 13 %
Lymphs Abs: 0.6 K/uL — ABNORMAL LOW (ref 0.7–4.0)
MCH: 31.3 pg (ref 26.0–34.0)
MCHC: 33 g/dL (ref 30.0–36.0)
MCV: 94.6 fL (ref 80.0–100.0)
Monocytes Absolute: 0.3 K/uL (ref 0.1–1.0)
Monocytes Relative: 6 %
Neutro Abs: 3.3 K/uL (ref 1.7–7.7)
Neutrophils Relative %: 70 %
Platelets: 205 K/uL (ref 150–400)
RBC: 3.36 MIL/uL — ABNORMAL LOW (ref 3.87–5.11)
RDW: 13.5 % (ref 11.5–15.5)
WBC: 4.7 K/uL (ref 4.0–10.5)
nRBC: 0 % (ref 0.0–0.2)

## 2024-04-17 LAB — VITAMIN B12: Vitamin B-12: 594 pg/mL (ref 180–914)

## 2024-04-17 LAB — GLUCOSE, CAPILLARY: Glucose-Capillary: 92 mg/dL (ref 70–99)

## 2024-04-17 MED ORDER — FLUDEOXYGLUCOSE F - 18 (FDG) INJECTION
7.8000 | Freq: Once | INTRAVENOUS | Status: AC
  Administered 2024-04-17: 7.8 via INTRAVENOUS

## 2024-04-17 MED ORDER — HEPARIN SOD (PORK) LOCK FLUSH 100 UNIT/ML IV SOLN
500.0000 [IU] | Freq: Once | INTRAVENOUS | Status: AC
  Administered 2024-04-17: 500 [IU] via INTRAVENOUS
  Filled 2024-04-17: qty 5

## 2024-04-21 ENCOUNTER — Other Ambulatory Visit (HOSPITAL_COMMUNITY): Payer: Self-pay

## 2024-04-24 ENCOUNTER — Inpatient Hospital Stay (HOSPITAL_BASED_OUTPATIENT_CLINIC_OR_DEPARTMENT_OTHER): Admitting: Hematology and Oncology

## 2024-04-24 ENCOUNTER — Encounter: Payer: Self-pay | Admitting: Hematology and Oncology

## 2024-04-24 VITALS — BP 141/66 | HR 75 | Resp 18 | Ht 64.0 in | Wt 155.0 lb

## 2024-04-24 DIAGNOSIS — R4589 Other symptoms and signs involving emotional state: Secondary | ICD-10-CM

## 2024-04-24 DIAGNOSIS — E538 Deficiency of other specified B group vitamins: Secondary | ICD-10-CM | POA: Diagnosis not present

## 2024-04-24 DIAGNOSIS — C541 Malignant neoplasm of endometrium: Secondary | ICD-10-CM

## 2024-04-24 NOTE — Assessment & Plan Note (Addendum)
 She was originally diagnosed with uterine cancer in 2014, early stage I disease but with high-grade serous She completed adjuvant treatment with 6 cycles of carboplatin  and paclitaxel  in 2014 along with radiation treatment  She is noted to have recurrent metastatic disease in her chest in 2016  Pathology: from 10/18, ER/Her2 neg. [facility] One testing done 11-2015 on surgical path from 2014:    MS stable, TMB low  4 muts/mb  She had recurrent disease and was treated with carboplatin , paclitaxel , Megace , tamoxifen , Doxil  and most recently with combination of lenvatinib  and pembrolizumab  since 2020 She achieved complete response in April 2023 and has been on maintenance treatment until May 2025  Last imaging study in November 2025 showed no evidence of disease She will return in 8 weeks for port flush and I plan to see her in 16 weeks with labs I plan to repeat imaging study again in 6 months, due in May 2026

## 2024-04-24 NOTE — Assessment & Plan Note (Addendum)
 She will continue monthly vitamin B12 injection Last B12 level was adequate

## 2024-04-24 NOTE — Assessment & Plan Note (Addendum)
 The patient has chronic anxiety, recently exacerbated by an event We discussed importance of counseling and other modalities available to help her cope

## 2024-04-24 NOTE — Progress Notes (Signed)
 [name] OFFICE PROGRESS NOTE  Patient Care Team: Cleotilde Planas, MD as PCP - General (Family Medicine) Nichole Senior, MD as Consulting Physician (Endocrinology)  Assessment & Plan Endometrial cancer [name]) She was originally diagnosed with uterine cancer in 2014, early stage I disease but with high-grade serous She completed adjuvant treatment with 6 cycles of carboplatin  and paclitaxel  in 2014 along with radiation treatment  She is noted to have recurrent metastatic disease in her chest in 2016  Pathology: from 10/18, ER/Her2 neg. [facility] One testing done 11-2015 on surgical path from 2014:    MS stable, TMB low  4 muts/mb  She had recurrent disease and was treated with carboplatin , paclitaxel , Megace , tamoxifen , Doxil  and most recently with combination of lenvatinib  and pembrolizumab  since 2020 She achieved complete response in April 2023 and has been on maintenance treatment until May 2025  Last imaging study in November 2025 showed no evidence of disease She will return in 8 weeks for port flush and I plan to see her in 16 weeks with labs I plan to repeat imaging study again in 6 months, due in May 2026 Vitamin B12 deficiency She will continue monthly vitamin B12 injection Last B12 level was adequate Anxiety about health The patient has chronic anxiety, recently exacerbated by an event We discussed importance of counseling and other modalities available to help her cope  No orders of the defined types were placed in this encounter.    Diane Bedford, MD  INTERVAL HISTORY: she returns for surveillance follow-up for review of test results after completion of treatment for recurrent uterine cancer She is doing well except for recent episode that exacerbated her anxiety I reviewed labs and imaging studies with the patient  PHYSICAL EXAMINATION: ECOG PERFORMANCE STATUS: 0 - Asymptomatic  Vitals:   04/24/24 1005  BP: (!) 141/66  Pulse: 75  Resp: 18  SpO2:  99%   Filed Weights   04/24/24 1005  Weight: 155 lb (70.3 kg)    Relevant data reviewed during this visit included CBC, CMP, vitamin B12, PET/CT imaging

## 2024-04-29 ENCOUNTER — Other Ambulatory Visit (HOSPITAL_COMMUNITY): Payer: Self-pay

## 2024-04-29 DIAGNOSIS — F330 Major depressive disorder, recurrent, mild: Secondary | ICD-10-CM | POA: Diagnosis not present

## 2024-04-29 DIAGNOSIS — F411 Generalized anxiety disorder: Secondary | ICD-10-CM | POA: Diagnosis not present

## 2024-04-29 MED ORDER — REXULTI 0.5 MG PO TABS
0.5000 mg | ORAL_TABLET | Freq: Every morning | ORAL | 1 refills | Status: AC
  Filled 2024-04-29: qty 30, 30d supply, fill #0

## 2024-05-07 ENCOUNTER — Other Ambulatory Visit: Payer: Self-pay

## 2024-05-07 ENCOUNTER — Other Ambulatory Visit (HOSPITAL_COMMUNITY): Payer: Self-pay

## 2024-05-08 ENCOUNTER — Other Ambulatory Visit (HOSPITAL_COMMUNITY): Payer: Self-pay

## 2024-05-08 ENCOUNTER — Other Ambulatory Visit: Payer: Self-pay

## 2024-05-08 MED ORDER — SIMVASTATIN 20 MG PO TABS
20.0000 mg | ORAL_TABLET | Freq: Every evening | ORAL | 0 refills | Status: AC
  Filled 2024-05-08: qty 90, 90d supply, fill #0

## 2024-05-20 ENCOUNTER — Other Ambulatory Visit (HOSPITAL_COMMUNITY): Payer: Self-pay

## 2024-05-20 DIAGNOSIS — F330 Major depressive disorder, recurrent, mild: Secondary | ICD-10-CM | POA: Diagnosis not present

## 2024-05-20 DIAGNOSIS — F411 Generalized anxiety disorder: Secondary | ICD-10-CM | POA: Diagnosis not present

## 2024-05-20 MED ORDER — REXULTI 0.5 MG PO TABS
0.5000 mg | ORAL_TABLET | Freq: Every morning | ORAL | 1 refills | Status: AC
  Filled 2024-05-20: qty 90, 90d supply, fill #0
  Filled ????-??-??: fill #0

## 2024-05-22 DIAGNOSIS — F330 Major depressive disorder, recurrent, mild: Secondary | ICD-10-CM | POA: Diagnosis not present

## 2024-05-22 DIAGNOSIS — F411 Generalized anxiety disorder: Secondary | ICD-10-CM | POA: Diagnosis not present

## 2024-05-23 ENCOUNTER — Other Ambulatory Visit (HOSPITAL_BASED_OUTPATIENT_CLINIC_OR_DEPARTMENT_OTHER): Payer: Self-pay

## 2024-05-23 ENCOUNTER — Other Ambulatory Visit (HOSPITAL_COMMUNITY): Payer: Self-pay

## 2024-06-04 ENCOUNTER — Other Ambulatory Visit: Payer: Self-pay

## 2024-06-04 ENCOUNTER — Other Ambulatory Visit (HOSPITAL_COMMUNITY): Payer: Self-pay

## 2024-06-04 MED ORDER — IMIQUIMOD 5 % EX CREA
1.0000 | TOPICAL_CREAM | Freq: Every day | CUTANEOUS | 11 refills | Status: AC
  Filled 2024-06-04: qty 24, 24d supply, fill #0
  Filled 2024-07-02: qty 24, 24d supply, fill #1

## 2024-06-12 ENCOUNTER — Inpatient Hospital Stay: Attending: Hematology and Oncology

## 2024-06-12 ENCOUNTER — Encounter: Payer: Self-pay | Admitting: Hematology and Oncology

## 2024-06-21 ENCOUNTER — Other Ambulatory Visit (HOSPITAL_COMMUNITY): Payer: Self-pay

## 2024-06-22 ENCOUNTER — Other Ambulatory Visit: Payer: Self-pay

## 2024-07-02 ENCOUNTER — Other Ambulatory Visit: Payer: Self-pay

## 2024-08-07 ENCOUNTER — Inpatient Hospital Stay: Admitting: Hematology and Oncology

## 2024-08-07 ENCOUNTER — Inpatient Hospital Stay
